# Patient Record
Sex: Male | Born: 1935
Health system: Southern US, Community
[De-identification: ages and names within clinical notes are randomized; demographics above are authoritative.]

## PROBLEM LIST (undated history)

## (undated) DIAGNOSIS — I2510 Atherosclerotic heart disease of native coronary artery without angina pectoris: Secondary | ICD-10-CM

## (undated) DIAGNOSIS — Z87898 Personal history of other specified conditions: Secondary | ICD-10-CM

## (undated) DIAGNOSIS — Z8719 Personal history of other diseases of the digestive system: Secondary | ICD-10-CM

## (undated) DIAGNOSIS — M1990 Unspecified osteoarthritis, unspecified site: Secondary | ICD-10-CM

## (undated) DIAGNOSIS — I10 Essential (primary) hypertension: Secondary | ICD-10-CM

## (undated) DIAGNOSIS — T8859XA Other complications of anesthesia, initial encounter: Secondary | ICD-10-CM

## (undated) DIAGNOSIS — E785 Hyperlipidemia, unspecified: Secondary | ICD-10-CM

## (undated) DIAGNOSIS — K635 Polyp of colon: Secondary | ICD-10-CM

## (undated) DIAGNOSIS — I219 Acute myocardial infarction, unspecified: Secondary | ICD-10-CM

## (undated) DIAGNOSIS — N138 Other obstructive and reflux uropathy: Secondary | ICD-10-CM

## (undated) DIAGNOSIS — K5730 Diverticulosis of large intestine without perforation or abscess without bleeding: Secondary | ICD-10-CM

## (undated) DIAGNOSIS — G253 Myoclonus: Secondary | ICD-10-CM

## (undated) DIAGNOSIS — N401 Enlarged prostate with lower urinary tract symptoms: Secondary | ICD-10-CM

## (undated) DIAGNOSIS — Z87442 Personal history of urinary calculi: Secondary | ICD-10-CM

## (undated) DIAGNOSIS — C439 Malignant melanoma of skin, unspecified: Secondary | ICD-10-CM

## (undated) DIAGNOSIS — T4145XA Adverse effect of unspecified anesthetic, initial encounter: Secondary | ICD-10-CM

## (undated) DIAGNOSIS — E119 Type 2 diabetes mellitus without complications: Secondary | ICD-10-CM

## (undated) DIAGNOSIS — Z87448 Personal history of other diseases of urinary system: Secondary | ICD-10-CM

## (undated) DIAGNOSIS — F419 Anxiety disorder, unspecified: Secondary | ICD-10-CM

## (undated) DIAGNOSIS — K859 Acute pancreatitis without necrosis or infection, unspecified: Secondary | ICD-10-CM

## (undated) DIAGNOSIS — K831 Obstruction of bile duct: Secondary | ICD-10-CM

## (undated) HISTORY — DX: Polyp of colon: K63.5

## (undated) HISTORY — DX: Atherosclerotic heart disease of native coronary artery without angina pectoris: I25.10

## (undated) HISTORY — DX: Personal history of other diseases of urinary system: Z87.448

## (undated) HISTORY — PX: APPENDECTOMY: SHX54

## (undated) HISTORY — DX: Personal history of other specified conditions: Z87.898

## (undated) HISTORY — DX: Anxiety disorder, unspecified: F41.9

## (undated) HISTORY — DX: Type 2 diabetes mellitus without complications: E11.9

## (undated) HISTORY — DX: Diverticulosis of large intestine without perforation or abscess without bleeding: K57.30

## (undated) HISTORY — DX: Personal history of other diseases of the digestive system: Z87.19

## (undated) HISTORY — DX: Malignant melanoma of skin, unspecified: C43.9

## (undated) HISTORY — PX: OTHER SURGICAL HISTORY: SHX169

## (undated) HISTORY — DX: Other obstructive and reflux uropathy: N13.8

## (undated) HISTORY — DX: Essential (primary) hypertension: I10

## (undated) HISTORY — DX: Hyperlipidemia, unspecified: E78.5

## (undated) HISTORY — DX: Unspecified osteoarthritis, unspecified site: M19.90

## (undated) HISTORY — PX: CHOLECYSTECTOMY: SHX55

## (undated) HISTORY — PX: CATARACT EXTRACTION: SUR2

## (undated) HISTORY — DX: Benign prostatic hyperplasia with lower urinary tract symptoms: N40.1

## (undated) HISTORY — PX: KNEE SURGERY: SHX244

## (undated) HISTORY — DX: Myoclonus: G25.3

---

## 2000-01-26 ENCOUNTER — Inpatient Hospital Stay (HOSPITAL_COMMUNITY): Admission: EM | Admit: 2000-01-26 | Discharge: 2000-01-30 | Payer: Self-pay | Admitting: Emergency Medicine

## 2000-01-26 ENCOUNTER — Encounter: Payer: Self-pay | Admitting: Emergency Medicine

## 2000-01-27 ENCOUNTER — Encounter: Payer: Self-pay | Admitting: Endocrinology

## 2000-01-28 ENCOUNTER — Encounter: Payer: Self-pay | Admitting: Pulmonary Disease

## 2000-01-28 ENCOUNTER — Encounter: Payer: Self-pay | Admitting: Orthopedic Surgery

## 2000-01-29 ENCOUNTER — Encounter: Payer: Self-pay | Admitting: Orthopedic Surgery

## 2000-09-03 ENCOUNTER — Ambulatory Visit (HOSPITAL_COMMUNITY): Admission: RE | Admit: 2000-09-03 | Discharge: 2000-09-03 | Payer: Self-pay | Admitting: Internal Medicine

## 2000-09-03 ENCOUNTER — Encounter: Payer: Self-pay | Admitting: Internal Medicine

## 2000-09-10 ENCOUNTER — Encounter: Payer: Self-pay | Admitting: Urology

## 2000-09-10 ENCOUNTER — Ambulatory Visit (HOSPITAL_COMMUNITY): Admission: RE | Admit: 2000-09-10 | Discharge: 2000-09-10 | Payer: Self-pay | Admitting: Urology

## 2000-09-30 ENCOUNTER — Other Ambulatory Visit: Admission: RE | Admit: 2000-09-30 | Discharge: 2000-09-30 | Payer: Self-pay | Admitting: Internal Medicine

## 2000-09-30 ENCOUNTER — Encounter (INDEPENDENT_AMBULATORY_CARE_PROVIDER_SITE_OTHER): Payer: Self-pay | Admitting: Specialist

## 2002-07-25 ENCOUNTER — Encounter: Payer: Self-pay | Admitting: Pulmonary Disease

## 2002-07-25 ENCOUNTER — Inpatient Hospital Stay (HOSPITAL_COMMUNITY): Admission: AD | Admit: 2002-07-25 | Discharge: 2002-07-29 | Payer: Self-pay | Admitting: Pulmonary Disease

## 2004-04-29 ENCOUNTER — Ambulatory Visit: Payer: Self-pay | Admitting: Pulmonary Disease

## 2004-05-10 ENCOUNTER — Ambulatory Visit: Payer: Self-pay | Admitting: Pulmonary Disease

## 2004-09-06 ENCOUNTER — Ambulatory Visit: Payer: Self-pay | Admitting: Pulmonary Disease

## 2005-01-13 ENCOUNTER — Ambulatory Visit: Payer: Self-pay | Admitting: Pulmonary Disease

## 2005-03-27 ENCOUNTER — Ambulatory Visit: Payer: Self-pay | Admitting: Pulmonary Disease

## 2005-04-10 HISTORY — PX: SHOULDER SURGERY: SHX246

## 2005-05-07 ENCOUNTER — Ambulatory Visit (HOSPITAL_BASED_OUTPATIENT_CLINIC_OR_DEPARTMENT_OTHER): Admission: RE | Admit: 2005-05-07 | Discharge: 2005-05-07 | Payer: Self-pay | Admitting: Orthopedic Surgery

## 2005-05-09 ENCOUNTER — Ambulatory Visit: Payer: Self-pay | Admitting: Pulmonary Disease

## 2005-05-11 ENCOUNTER — Emergency Department (HOSPITAL_COMMUNITY): Admission: EM | Admit: 2005-05-11 | Discharge: 2005-05-11 | Payer: Self-pay | Admitting: Emergency Medicine

## 2005-07-15 ENCOUNTER — Ambulatory Visit: Payer: Self-pay | Admitting: Pulmonary Disease

## 2005-08-04 ENCOUNTER — Ambulatory Visit: Payer: Self-pay | Admitting: Pulmonary Disease

## 2006-01-12 ENCOUNTER — Ambulatory Visit: Payer: Self-pay | Admitting: Pulmonary Disease

## 2006-06-02 ENCOUNTER — Ambulatory Visit: Payer: Self-pay | Admitting: Pulmonary Disease

## 2006-06-02 ENCOUNTER — Ambulatory Visit: Payer: Self-pay | Admitting: Internal Medicine

## 2006-06-02 ENCOUNTER — Inpatient Hospital Stay (HOSPITAL_COMMUNITY): Admission: EM | Admit: 2006-06-02 | Discharge: 2006-06-04 | Payer: Self-pay | Admitting: Emergency Medicine

## 2006-07-13 ENCOUNTER — Ambulatory Visit: Payer: Self-pay | Admitting: Pulmonary Disease

## 2006-12-25 ENCOUNTER — Ambulatory Visit: Payer: Self-pay | Admitting: Pulmonary Disease

## 2006-12-29 ENCOUNTER — Ambulatory Visit (HOSPITAL_BASED_OUTPATIENT_CLINIC_OR_DEPARTMENT_OTHER): Admission: RE | Admit: 2006-12-29 | Discharge: 2006-12-29 | Payer: Self-pay | Admitting: Urology

## 2006-12-31 ENCOUNTER — Ambulatory Visit (HOSPITAL_COMMUNITY): Admission: RE | Admit: 2006-12-31 | Discharge: 2006-12-31 | Payer: Self-pay | Admitting: Urology

## 2007-01-26 ENCOUNTER — Telehealth (INDEPENDENT_AMBULATORY_CARE_PROVIDER_SITE_OTHER): Payer: Self-pay | Admitting: *Deleted

## 2007-02-12 DIAGNOSIS — I10 Essential (primary) hypertension: Secondary | ICD-10-CM | POA: Insufficient documentation

## 2007-02-12 DIAGNOSIS — D126 Benign neoplasm of colon, unspecified: Secondary | ICD-10-CM

## 2007-02-12 DIAGNOSIS — M1990 Unspecified osteoarthritis, unspecified site: Secondary | ICD-10-CM

## 2007-02-12 DIAGNOSIS — N200 Calculus of kidney: Secondary | ICD-10-CM

## 2007-02-12 DIAGNOSIS — I152 Hypertension secondary to endocrine disorders: Secondary | ICD-10-CM | POA: Insufficient documentation

## 2007-02-15 ENCOUNTER — Ambulatory Visit: Payer: Self-pay | Admitting: Pulmonary Disease

## 2007-02-15 DIAGNOSIS — K5730 Diverticulosis of large intestine without perforation or abscess without bleeding: Secondary | ICD-10-CM | POA: Insufficient documentation

## 2007-02-15 DIAGNOSIS — F419 Anxiety disorder, unspecified: Secondary | ICD-10-CM | POA: Insufficient documentation

## 2007-02-15 DIAGNOSIS — F411 Generalized anxiety disorder: Secondary | ICD-10-CM

## 2007-02-15 DIAGNOSIS — N12 Tubulo-interstitial nephritis, not specified as acute or chronic: Secondary | ICD-10-CM | POA: Insufficient documentation

## 2007-02-15 DIAGNOSIS — I2510 Atherosclerotic heart disease of native coronary artery without angina pectoris: Secondary | ICD-10-CM | POA: Insufficient documentation

## 2007-02-21 DIAGNOSIS — K2990 Gastroduodenitis, unspecified, without bleeding: Secondary | ICD-10-CM

## 2007-02-21 DIAGNOSIS — K2970 Gastritis, unspecified, without bleeding: Secondary | ICD-10-CM | POA: Insufficient documentation

## 2007-02-21 LAB — CONVERTED CEMR LAB
ALT: 26 units/L (ref 0–53)
Albumin: 4.1 g/dL (ref 3.5–5.2)
BUN: 19 mg/dL (ref 6–23)
Basophils Absolute: 0.2 10*3/uL — ABNORMAL HIGH (ref 0.0–0.1)
Bilirubin, Direct: 0.2 mg/dL (ref 0.0–0.3)
Calcium: 9.6 mg/dL (ref 8.4–10.5)
Eosinophils Absolute: 0.2 10*3/uL (ref 0.0–0.6)
GFR calc Af Amer: 85 mL/min
GFR calc non Af Amer: 70 mL/min
Glucose, Bld: 143 mg/dL — ABNORMAL HIGH (ref 70–99)
HDL: 31.9 mg/dL — ABNORMAL LOW (ref >39.0)
Lymphocytes Relative: 33.5 % (ref 12.0–46.0)
MCHC: 35 g/dL (ref 30.0–36.0)
MCV: 93.6 fL (ref 78.0–100.0)
Monocytes Relative: 9.5 % (ref 3.0–11.0)
Neutro Abs: 2.7 10*3/uL (ref 1.4–7.7)
Platelets: 297 10*3/uL (ref 150–400)
TSH: 2.44 microintl units/mL (ref 0.35–5.50)
Triglycerides: 144 mg/dL (ref 0–149)

## 2007-03-16 ENCOUNTER — Ambulatory Visit: Payer: Self-pay | Admitting: Cardiology

## 2007-03-16 ENCOUNTER — Inpatient Hospital Stay (HOSPITAL_COMMUNITY): Admission: EM | Admit: 2007-03-16 | Discharge: 2007-03-19 | Payer: Self-pay | Admitting: Emergency Medicine

## 2007-03-16 ENCOUNTER — Ambulatory Visit: Payer: Self-pay | Admitting: Pulmonary Disease

## 2007-03-17 ENCOUNTER — Encounter: Payer: Self-pay | Admitting: Pulmonary Disease

## 2007-03-17 ENCOUNTER — Encounter: Payer: Self-pay | Admitting: Internal Medicine

## 2007-03-22 ENCOUNTER — Ambulatory Visit: Payer: Self-pay | Admitting: Internal Medicine

## 2007-04-26 ENCOUNTER — Ambulatory Visit: Payer: Self-pay | Admitting: Internal Medicine

## 2007-04-26 LAB — CONVERTED CEMR LAB
AST: 21 units/L (ref 0–37)
Albumin: 3.9 g/dL (ref 3.5–5.2)
Amylase: 68 units/L (ref 27–131)
Lipase: 46 units/L (ref 11.0–59.0)

## 2007-08-16 ENCOUNTER — Ambulatory Visit: Payer: Self-pay | Admitting: Pulmonary Disease

## 2007-08-17 ENCOUNTER — Encounter: Payer: Self-pay | Admitting: Pulmonary Disease

## 2007-08-22 LAB — CONVERTED CEMR LAB
ALT: 29 units/L (ref 0–53)
Albumin: 4.1 g/dL (ref 3.5–5.2)
CO2: 30 meq/L (ref 19–32)
Calcium: 9.7 mg/dL (ref 8.4–10.5)
Creatinine, Ser: 1.2 mg/dL (ref 0.4–1.5)
Glucose, Bld: 136 mg/dL — ABNORMAL HIGH (ref 70–99)
HDL: 31.1 mg/dL — ABNORMAL LOW (ref >39.0)
Hgb A1c MFr Bld: 6.9 % — ABNORMAL HIGH (ref 4.6–6.0)
Total CHOL/HDL Ratio: 5.1
Total Protein: 7.2 g/dL (ref 6.0–8.3)
Triglycerides: 162 mg/dL — ABNORMAL HIGH (ref 0–149)

## 2007-08-23 ENCOUNTER — Telehealth (INDEPENDENT_AMBULATORY_CARE_PROVIDER_SITE_OTHER): Payer: Self-pay | Admitting: *Deleted

## 2007-09-06 ENCOUNTER — Encounter: Payer: Self-pay | Admitting: Pulmonary Disease

## 2008-03-28 ENCOUNTER — Ambulatory Visit: Payer: Self-pay | Admitting: Pulmonary Disease

## 2008-03-29 LAB — CONVERTED CEMR LAB
AST: 22 units/L (ref 0–37)
Albumin: 4 g/dL (ref 3.5–5.2)
Alkaline Phosphatase: 61 units/L (ref 39–117)
BUN: 18 mg/dL (ref 6–23)
Bilirubin, Direct: 0.1 mg/dL (ref 0.0–0.3)
Chloride: 104 meq/L (ref 96–112)
Eosinophils Relative: 4.2 % (ref 0.0–5.0)
GFR calc non Af Amer: 70 mL/min
Glucose, Bld: 171 mg/dL — ABNORMAL HIGH (ref 70–99)
Monocytes Relative: 9.9 % (ref 3.0–12.0)
Neutrophils Relative %: 53.9 % (ref 43.0–77.0)
Platelets: 317 10*3/uL (ref 150–400)
Potassium: 3.6 meq/L (ref 3.5–5.1)
WBC: 6.3 10*3/uL (ref 4.5–10.5)

## 2008-04-16 ENCOUNTER — Inpatient Hospital Stay (HOSPITAL_COMMUNITY): Admission: EM | Admit: 2008-04-16 | Discharge: 2008-04-18 | Payer: Self-pay | Admitting: Emergency Medicine

## 2008-04-16 ENCOUNTER — Ambulatory Visit: Payer: Self-pay | Admitting: Critical Care Medicine

## 2008-04-16 ENCOUNTER — Ambulatory Visit: Payer: Self-pay | Admitting: Cardiology

## 2008-04-17 ENCOUNTER — Encounter: Payer: Self-pay | Admitting: Pulmonary Disease

## 2008-04-18 ENCOUNTER — Encounter: Payer: Self-pay | Admitting: Pulmonary Disease

## 2008-04-18 DIAGNOSIS — Z8669 Personal history of other diseases of the nervous system and sense organs: Secondary | ICD-10-CM

## 2008-04-19 ENCOUNTER — Telehealth (INDEPENDENT_AMBULATORY_CARE_PROVIDER_SITE_OTHER): Payer: Self-pay

## 2008-04-20 ENCOUNTER — Ambulatory Visit: Payer: Self-pay

## 2008-04-20 ENCOUNTER — Encounter: Payer: Self-pay | Admitting: Cardiology

## 2008-05-03 ENCOUNTER — Ambulatory Visit: Payer: Self-pay | Admitting: Pulmonary Disease

## 2008-06-02 ENCOUNTER — Ambulatory Visit: Payer: Self-pay | Admitting: Cardiology

## 2008-06-02 ENCOUNTER — Encounter: Payer: Self-pay | Admitting: Cardiology

## 2008-06-12 ENCOUNTER — Ambulatory Visit: Payer: Self-pay | Admitting: Cardiology

## 2008-06-13 LAB — CONVERTED CEMR LAB
BUN: 21 mg/dL (ref 6–23)
CO2: 30 meq/L (ref 19–32)
GFR calc non Af Amer: 70.04 mL/min (ref >60)
Glucose, Bld: 139 mg/dL — ABNORMAL HIGH (ref 70–99)
Potassium: 3.5 meq/L (ref 3.5–5.1)

## 2008-08-10 ENCOUNTER — Ambulatory Visit: Payer: Self-pay | Admitting: Pulmonary Disease

## 2008-08-16 LAB — CONVERTED CEMR LAB
ALT: 26 units/L (ref 0–53)
AST: 22 units/L (ref 0–37)
Albumin: 4 g/dL (ref 3.5–5.2)
Alkaline Phosphatase: 56 units/L (ref 39–117)
Basophils Relative: 0.5 % (ref 0.0–3.0)
Calcium: 9.4 mg/dL (ref 8.4–10.5)
Eosinophils Relative: 5.6 % — ABNORMAL HIGH (ref 0.0–5.0)
GFR calc non Af Amer: 70.01 mL/min (ref >60)
Glucose, Bld: 135 mg/dL — ABNORMAL HIGH (ref 70–99)
HCT: 46.4 % (ref 39.0–52.0)
Hemoglobin: 16 g/dL (ref 13.0–17.0)
LDL Cholesterol: 98 mg/dL (ref 0–99)
Lymphocytes Relative: 32.7 % (ref 12.0–46.0)
Lymphs Abs: 1.7 10*3/uL (ref 0.7–4.0)
Monocytes Relative: 10.1 % (ref 3.0–12.0)
Neutro Abs: 2.7 10*3/uL (ref 1.4–7.7)
Potassium: 4.1 meq/L (ref 3.5–5.1)
RBC: 4.86 M/uL (ref 4.22–5.81)
Sodium: 141 meq/L (ref 135–145)
TSH: 2.08 microintl units/mL (ref 0.35–5.50)
Total CHOL/HDL Ratio: 5
VLDL: 32.4 mg/dL (ref 0.0–40.0)
WBC: 5.2 10*3/uL (ref 4.5–10.5)

## 2008-09-21 ENCOUNTER — Encounter (INDEPENDENT_AMBULATORY_CARE_PROVIDER_SITE_OTHER): Payer: Self-pay | Admitting: *Deleted

## 2008-10-13 ENCOUNTER — Encounter (INDEPENDENT_AMBULATORY_CARE_PROVIDER_SITE_OTHER): Payer: Self-pay | Admitting: *Deleted

## 2009-02-12 ENCOUNTER — Ambulatory Visit: Payer: Self-pay | Admitting: Pulmonary Disease

## 2009-02-14 LAB — CONVERTED CEMR LAB
ALT: 32 units/L (ref 0–53)
AST: 24 units/L (ref 0–37)
Alkaline Phosphatase: 60 units/L (ref 39–117)
BUN: 18 mg/dL (ref 6–23)
Basophils Relative: 0.9 % (ref 0.0–3.0)
Bilirubin, Direct: 0.1 mg/dL (ref 0.0–0.3)
Chloride: 104 meq/L (ref 96–112)
Cholesterol: 174 mg/dL (ref 0–200)
Creatinine, Ser: 1.1 mg/dL (ref 0.4–1.5)
Eosinophils Relative: 4.4 % (ref 0.0–5.0)
GFR calc non Af Amer: 69.91 mL/min (ref >60)
HCT: 45.7 % (ref 39.0–52.0)
LDL Cholesterol: 104 mg/dL — ABNORMAL HIGH (ref 0–99)
MCV: 98.2 fL (ref 78.0–100.0)
Monocytes Relative: 11.2 % (ref 3.0–12.0)
Neutrophils Relative %: 55.2 % (ref 43.0–77.0)
Platelets: 297 10*3/uL (ref 150.0–400.0)
Potassium: 3.7 meq/L (ref 3.5–5.1)
RBC: 4.65 M/uL (ref 4.22–5.81)
Total Bilirubin: 0.8 mg/dL (ref 0.3–1.2)
Total CHOL/HDL Ratio: 5
Total Protein: 7.2 g/dL (ref 6.0–8.3)
VLDL: 34 mg/dL (ref 0.0–40.0)
WBC: 5.6 10*3/uL (ref 4.5–10.5)

## 2009-03-12 ENCOUNTER — Telehealth: Payer: Self-pay | Admitting: Pulmonary Disease

## 2009-03-26 ENCOUNTER — Encounter (INDEPENDENT_AMBULATORY_CARE_PROVIDER_SITE_OTHER): Payer: Self-pay | Admitting: *Deleted

## 2009-04-12 ENCOUNTER — Encounter: Payer: Self-pay | Admitting: Pulmonary Disease

## 2009-05-03 ENCOUNTER — Encounter (INDEPENDENT_AMBULATORY_CARE_PROVIDER_SITE_OTHER): Payer: Self-pay | Admitting: *Deleted

## 2009-05-07 ENCOUNTER — Encounter (INDEPENDENT_AMBULATORY_CARE_PROVIDER_SITE_OTHER): Payer: Self-pay | Admitting: *Deleted

## 2009-05-07 ENCOUNTER — Ambulatory Visit: Payer: Self-pay | Admitting: Internal Medicine

## 2009-05-07 ENCOUNTER — Telehealth: Payer: Self-pay | Admitting: Pulmonary Disease

## 2009-05-17 ENCOUNTER — Telehealth (INDEPENDENT_AMBULATORY_CARE_PROVIDER_SITE_OTHER): Payer: Self-pay | Admitting: *Deleted

## 2009-05-21 ENCOUNTER — Ambulatory Visit: Payer: Self-pay | Admitting: Internal Medicine

## 2009-08-14 ENCOUNTER — Ambulatory Visit: Payer: Self-pay | Admitting: Pulmonary Disease

## 2009-08-22 LAB — CONVERTED CEMR LAB
BUN: 19 mg/dL (ref 6–23)
CO2: 31 meq/L (ref 19–32)
Calcium: 9.6 mg/dL (ref 8.4–10.5)
Chloride: 103 meq/L (ref 96–112)
Creatinine, Ser: 1 mg/dL (ref 0.4–1.5)
Glucose, Bld: 146 mg/dL — ABNORMAL HIGH (ref 70–99)
Hgb A1c MFr Bld: 7.3 % — ABNORMAL HIGH (ref 4.6–6.5)
LDL Cholesterol: 88 mg/dL (ref 0–99)
Total CHOL/HDL Ratio: 5
Triglycerides: 192 mg/dL — ABNORMAL HIGH (ref 0.0–149.0)

## 2009-11-04 ENCOUNTER — Ambulatory Visit: Payer: Self-pay | Admitting: Cardiovascular Disease

## 2009-11-04 ENCOUNTER — Observation Stay (HOSPITAL_COMMUNITY): Admission: EM | Admit: 2009-11-04 | Discharge: 2009-11-06 | Payer: Self-pay | Admitting: Emergency Medicine

## 2009-11-09 ENCOUNTER — Encounter: Payer: Self-pay | Admitting: Pulmonary Disease

## 2009-12-26 ENCOUNTER — Encounter: Payer: Self-pay | Admitting: Pulmonary Disease

## 2010-02-27 ENCOUNTER — Telehealth (INDEPENDENT_AMBULATORY_CARE_PROVIDER_SITE_OTHER): Payer: Self-pay | Admitting: *Deleted

## 2010-03-12 NOTE — Letter (Signed)
Summary: Moviprep Instructions  [facility]  [address]   [place], [place] [address]   Phone: [phone]  Fax: [phone]       CHEVY SWEIGERT    06-25-1934    MRN: [id number]        Procedure Day /Date: Monday 05-21-09     Arrival Time: 7:30 a.m.      Procedure Time: 8:30 a.m.     Location of Procedure:                    x   [facility] (4th Floor)                        PREPARATION FOR COLONOSCOPY WITH MOVIPREP   Starting 5 days prior to your procedure 05-16-09  do not eat nuts, seeds, popcorn, corn, beans, peas,  salads, or any raw vegetables.  Do not take any fiber supplements (e.g. Metamucil, Citrucel, and Benefiber).  THE DAY BEFORE YOUR PROCEDURE         DATE: 05-20-09   DAY: Sunday  1.  Drink clear liquids the entire day-NO SOLID FOOD  2.  Do not drink anything colored red or purple.  Avoid juices with pulp.  No orange juice.  3.  Drink at least 64 oz. (8 glasses) of fluid/clear liquids during the day to prevent dehydration and help the prep work efficiently.  CLEAR LIQUIDS INCLUDE: Water Jello Ice Popsicles Tea (sugar ok, no milk/cream) Powdered fruit flavored drinks Coffee (sugar ok, no milk/cream) Gatorade Juice: apple, white grape, white cranberry  Lemonade Clear bullion, consomm, broth Carbonated beverages (any kind) Strained chicken noodle soup Hard Candy                             4.  In the morning, mix first dose of MoviPrep solution:    Empty 1 Pouch A and 1 Pouch B into the disposable container    Add lukewarm drinking water to the top line of the container. Mix to dissolve    Refrigerate (mixed solution should be used within 24 hrs)  5.  Begin drinking the prep at 5:00 p.m. The MoviPrep container is divided by 4 marks.   Every 15 minutes drink the solution down to the next mark (approximately 8 oz) until the full liter is complete.   6.  Follow completed prep with 16 oz of clear liquid of your choice  (Nothing red or purple).  Continue to drink clear liquids until bedtime.  7.  Before going to bed, mix second dose of MoviPrep solution:    Empty 1 Pouch A and 1 Pouch B into the disposable container    Add lukewarm drinking water to the top line of the container. Mix to dissolve    Refrigerate  THE DAY OF YOUR PROCEDURE      DATE: 05-21-09   DAY: Monday  Beginning at 3:30 a.m. (5 hours before procedure):         1. Every 15 minutes, drink the solution down to the next mark (approx 8 oz) until the full liter is complete.  2. Follow completed prep with 16 oz. of clear liquid of your choice.    3. You may drink clear liquids until 6:30 a.m.  (2 HOURS BEFORE PROCEDURE).   MEDICATION INSTRUCTIONS  Unless otherwise instructed, you should take regular prescription medications with a small sip of water   as  early as possible the morning of your procedure . See diabetic instructions on separate sheet  Additional medication instructions: Hold Lisinopril the morning of  procedure.         OTHER INSTRUCTIONS  You will need a responsible adult at least 74 years of age to accompany you and drive you home.   This person must remain in the waiting room during your procedure.  Wear loose fitting clothing that is easily removed.  Leave jewelry and other valuables at home.  However, you may wish to bring a book to read or  an iPod/MP3 player to listen to music as you wait for your procedure to start.  Remove all body piercing jewelry and leave at home.  Total time from sign-in until discharge is approximately 2-3 hours.  You should go home directly after your procedure and rest.  You can resume normal activities the  day after your procedure.  The day of your procedure you should not:   Drive   Make legal decisions   Operate machinery   Drink alcohol   Return to work  You will receive specific instructions about eating, activities and medications before you  leave.    The above instructions have been reviewed and explained to me by   _______________________    I fully understand and can verbalize these instructions _____________________________ Date _________

## 2010-03-12 NOTE — Assessment & Plan Note (Signed)
Summary: 6 months/apc   Primary Care Provider:  Kieran Arreguin  CC:  6 month ROV & review of mult medical problems....  History of Present Illness: 76 y/o WM here for a follow up visit... he has multiple medical problems as noted below... Followed for general medical purposes w/ HBP, CAD, Hyperlipidemia, DM, DJD, etc... also sees DrWall for Cards, DrPerry for GI (Gastritis, Divertics, Polyps), & DrRDavis for Urology (Hx Kidney stones & BPH)...    ~  [facility] 3/7-9/10 w/ dizzy & presyncope- hx CAD w/ non-obstructive dis on cath 2004... denied CP, EKG w/ NSSTTWA, enz neg, monitored on the ward w/o arrhythmia, seen by DrWall w/ 2DEcho showing similar to prev w/ mild LVD, EF= 50-55%, mild HK in distal ant wall, mild incr AoV thickness, mild MR...   ~  f/u DrWall- no CP, no palpit, no dizziness... Myoview 04/20/08 showed mild global HK, EF= 47%, apical thinning vs sm infarct & mild apical ischemia- reported as a low risk study... he incr Lisinopril/ Hct to Bid w/ K20...   ~  February 12, 2009:  the cold weather bothers his hips and knees... BP well controlled on meds; no CP, palpit, SOB, edema, etc; he has sl incr TG & low HDL w/ rec to try Fenofibrate but he refuses- choosing diet alone;  weight up 5# over the holidays w/ BS at homeOK per pt... see prob list below>>  OK Tdap today.   ~  August 14, 2009:  he's had a good 2mo- he saw DrGraves for Ortho 3/11 w/ "endstage" DJD in knees & told he needs TKR, he's lost 19# on diet & states he's doing just fine... also saw DrPerry 4/11 for colonoscopy +divertics, fair prep but no polyps seen (Dr wants f/u 60yrs w/ better prep)... BP controlled;  denies angina, etc;  Lipids doing well on diet with his weight loss;  DM control similar- keep up the good work...   Current Problem List:  HEARING LOSS - he also sees DrShoemaker w/ hearing eval 7/09 & new hearing aides...  HYPERTENSION (ICD-401.9) - controlled on METOPROLOL 50mg Bid, LISINOPRIL/HCT 20-12.5 Bid, KCl 20/d & NORVASC  10mg /d...  BP is 140/72 here today and similar at home... he knows to follow a low salt diet and needs to lose wt... tolerating meds well- denies HA, fatigue, visual changes, CP, palipit, dizziness, syncope, dyspnea, edema, etc...  CAD (ICD-414.00) - Hx non-obstructive disease on cath 6/04 w/ 50%3rdDiag branch of LAD, 40%CIRC, 30%distalRCA... he takes ASA 81mg  daily and practicing secondary risk factor reduction strategy... no CP, palpit, change in breathing, etc... he is due for a f/u w/ drWall this spring...  ~  Mar10:  Myoview showed mild global HK, EF= 47%, apic thinning vs sm ifarct & ?mild ischemia.  ~  7/11:  no CP, palpit, SOB, etc... he is active- walking, yard, etc...  HYPERLIPIDEMIA, BORDERLINE (ICD-272.4) - on diet alone...  ~  FLP 12/07 showing TChol 157, TG 137, HDL 35, LDL 95  ~  FLP 1/09 showed TChol 170, TG 144, HDL 32, LDL 109  ~  FLP 7/09 (wt= 564#) showed TChol 158, TG 162, HDL 31, LDL 95  ~  FLP in hosp 3/10 showed TChol 154, TG 177, HDL 25, LDL 94... he doesn't want Fibrate Rx.  ~  FLP 7/10 showed TChol 160, TG 162, HDL 30, LDL 98... still refuses med Rx...  ~  FLP 1/11 showed TChol 174, TG 170, HDL 36, LDL 104  ~  FLP 7/11 showed  TChol 161, TG 192, HDL 34, LDL 88  AODM (ICD-250.00) - on METFORMIN 500mg Qd + diet... discussed weight reduction strategies...  ~  in Jun 28, 2006 BS=127, & HgA1c=6.5 on Metformin 500Bid... pt decr to 1/d on his own.  ~  labs 1/09 showed BS= 143, HgA1c= 6.8.Marland Kitchen.  ~  labs 7/09 (wt= 233#) showed BS= 136, HgA1c= 6.9.Marland KitchenMarland Kitchen rec- OK on Metform500/d, needs better diet.  ~  labs 2/10 (wt= 242#) showed BS= 171, HgA1c= 6.9.Marland Kitchen. rec> get wt down.  ~  labs 7/10 (wt= 240#) showed BS= 135, A1c= 7.0.Marland KitchenMarland Kitchen may need additional med.  ~  labs 1/11 (wt=245#) showed BS= 149, A1c= 7.1  ~  labs 7/11 (wt= 226#) showed BS= 146, A1c= 7.3  Hx of GASTRITIS (ICD-535.50) - on PRILOSEC 20mg  OTC Prn...  ~  EGD 2/09 by DrDBrodie showed 2cmHH, mod esophagitis, duodenitis... Rx'd  ProtonixBid.  DIVERTICULOSIS OF COLON (ICD-562.10) COLONIC POLYPS (ICD-211.3) - last colonoscopy 9/05 by DrPerry showed divertics & diminutive polyp... f/u planned 5 years & due now- he will call to set this up...  RENAL CALCULUS (ICD-592.0) - on FLOMAX 0.4mg /d per CWCBJSEG... states he had yet another stone requiring double-j stent and lithotripsy... last seen by DrHumphries 12/08... he has a f/u appt w/ BTDVVOHY 2/11 & he does his PSA labs...  ~  labs 7/10 showed PSA= 2.09  ~  labs 2/11 by [facility]- we don't have his notes.  Hx of PYELONEPHRITIS (ICD-590.80) - Adm 4/08 w/ pyelo from obstructing R kidney stone requiring cysto, stent, laser fragmentation...  DEGENERATIVE JOINT DISEASE (ICD-715.90) - he uses OTC meds w/ relief...  ANXIETY (ICD-300.00) - his wife passed away  2005/06/27 & he has been enjoying fishing, hunting, and taking care of his 38 y/o grandson.   Preventive Screening-Counseling & Management  Alcohol-Tobacco     Smoking Status: never smoked  Allergies: 1)  Codeine Phosphate (Codeine Phosphate) 2)  Morphine  Comments:  Nurse/Medical Assistant: The patient's medications and allergies were reviewed with the patient and were updated in the Medication and Allergy Lists.  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 mother deceased age 21 from MI father deceased in his 64's from ?MI 1 brother deceased age 27 from lung cancer 1 brother alive and well age 66 1 sister alive and well age 45 1 sister alive and well age 72  Social History: Reviewed history from 08/10/2008 and no changes required. widower never smoked no etoh retired exposed to second hand smoke walks daily for exercise no caffeine use 2 children--daughter 50 and son 31 Smoking Status:  never smoked  Review of Systems      See HPI  The patient denies anorexia, fever, weight loss, weight gain, vision loss, decreased hearing, hoarseness, chest pain, syncope, dyspnea on exertion, peripheral edema, prolonged cough, headaches, hemoptysis, abdominal pain, melena, hematochezia, severe indigestion/heartburn, hematuria, incontinence, muscle weakness, suspicious skin lesions, transient blindness, difficulty walking, depression, unusual weight change, abnormal bleeding, enlarged lymph nodes, and  angioedema.    Vital Signs:  Patient profile:   76 year old male Height:      72 inches Weight:      226 pounds BMI:     30.76 O2 Sat:      93 % on Room air Temp:     97.4 degrees F oral Pulse rate:   62 / minute BP sitting:   140 / 72  (right arm) Cuff size:   regular  Vitals Entered By: Randell Loop CMA (August 14, 2009 9:47 AM)  O2 Sat at Rest %:  93 O2 Flow:  Room air CC: 6 month ROV & review of mult medical problems... Is Patient Diabetic? Yes Pain Assessment Patient in pain? no      Comments no changes in meds today   Physical Exam  Additional Exam:  WD, Overweight, 76 y/o WM in NAD... GENERAL:  Alert & oriented; pleasant & cooperative... HEENT:  [place]/AT, EOM-wnl, PERRLA, EACs-clear, TMs-wnl, NOSE-clear, THROAT-clear & wnl. NECK:  Supple w/ fairROM; no JVD; normal carotid impulses w/o bruits; no thyromegaly or nodules palpated; no lymphadenopathy. CHEST:  Clear to P & A; without  wheezes/ rales/ or rhonchi. HEART:  Regular Rhythm; without murmurs/ rubs/ or gallops. ABDOMEN:  Soft & nontender; normal bowel sounds; no organomegaly or masses detected. EXT: without deformities, mild arthritic changes; no varicose veins/ +venous insuffic/ or edema. NEURO:  CN's intact;  no focal neuro deficits... DERM:  No lesions noted; no rash etc...    MISC. Report  Procedure date:  08/14/2009  Findings:      BMP (METABOL)   Sodium                    140 mEq/L                   135-145   Potassium                 4.2 mEq/L                   3.5-5.1   Chloride                  103 mEq/L                   96-112   Carbon Dioxide            31 mEq/L                    19-32   Glucose              [H]  146 mg/dL                   16-10   BUN                       19 mg/dL                    9-60   Creatinine                1.0 mg/dL                   4.5-4.0   Calcium                   9.6 mg/dL  8.4-10.5   GFR                       74.48 mL/min                >60  Lipid Panel (LIPID)   Cholesterol               161 mg/dL                   4-540   Triglycerides        [H]  192.0 mg/dL                 9.8-119.1   HDL                  [L]  47.82 mg/dL                 >95.62   LDL Cholesterol           88 mg/dL                    1-30  Hemoglobin A1C (A1C)   Hemoglobin A1C       [H]  7.3 %                       4.6-6.5   Impression & Recommendations:  Problem # 1:  HYPERTENSION (ICD-401.9) BP controlled on meds>  continue same. His updated medication list for this problem includes:    Metoprolol Tartrate 50 Mg Tabs (Metoprolol tartrate) .Marland Kitchen... Take 1 tab by mouth two times a day...    Lisinopril-hydrochlorothiazide 20-12.5 Mg Tabs (Lisinopril-hydrochlorothiazide) .Marland Kitchen... Take 2 tablet by mouth once a day    Norvasc 10 Mg Tabs (Amlodipine besylate) .Marland Kitchen... 1 tab daily  Orders: TLB-BMP (Basic Metabolic Panel-BMET) (80048-METABOL) TLB-Lipid Panel  (80061-LIPID) TLB-A1C / Hgb A1C (Glycohemoglobin) (83036-A1C)  Problem # 2:  CAD (ICD-414.00) Stable w/o angina etc... same meds. His updated medication list for this problem includes:    Aspirin Ec 325 Mg Tbec (Aspirin) .Marland Kitchen... Take one tablet by mouth daily    Metoprolol Tartrate 50 Mg Tabs (Metoprolol tartrate) .Marland Kitchen... Take 1 tab by mouth two times a day...    Lisinopril-hydrochlorothiazide 20-12.5 Mg Tabs (Lisinopril-hydrochlorothiazide) .Marland Kitchen... Take 2 tablet by mouth once a day    Norvasc 10 Mg Tabs (Amlodipine besylate) .Marland Kitchen... 1 tab daily  Problem # 3:  HYPERLIPIDEMIA, BORDERLINE (ICD-272.4) FLP looks good on the diet + his recent weight loss...  Problem # 4:  AODM (ICD-250.00) A1c still in the low 7's and don't want to add more meds... continue Metformin + diet exercise etc... His updated medication list for this problem includes:    Aspirin Ec 325 Mg Tbec (Aspirin) .Marland Kitchen... Take one tablet by mouth daily    Lisinopril-hydrochlorothiazide 20-12.5 Mg Tabs (Lisinopril-hydrochlorothiazide) .Marland Kitchen... Take 2 tablet by mouth once a day    Metformin Hcl 500 Mg Tabs (Metformin hcl) .Marland Kitchen... Take 1 tablet by mouth two times a day  Problem # 5:  COLONIC POLYPS (ICD-211.3) S/p recent colonoscopy w/ divertics, anastomosis OK, no recurrent polyps seen...  Problem # 6:  RENAL CALCULUS (ICD-592.0) Hx kidney stones, prev pyelo, etc...   Problem # 7:  OTHER MEDICAL PROBLEMS AS NOTED>>>   Complete Medication List: 1)  Aspirin Ec 325 Mg Tbec (Aspirin) .... Take one tablet by mouth daily 2)  Metoprolol Tartrate 50 Mg Tabs (Metoprolol tartrate) .... Take 1 tab by  mouth two times a day... 3)  Lisinopril-hydrochlorothiazide 20-12.5 Mg Tabs (Lisinopril-hydrochlorothiazide) .... Take 2 tablet by mouth once a day 4)  Norvasc 10 Mg Tabs (Amlodipine besylate) .Marland Kitchen.. 1 tab daily 5)  Klor-con M20 20 Meq Cr-tabs (Potassium chloride crys cr) .... Take one tablet by mouth daily. 6)  Metformin Hcl 500 Mg Tabs (Metformin hcl)  .... Take 1 tablet by mouth two times a day 7)  Flomax 0.4 Mg Cp24 (Tamsulosin hcl) .... Take 1 tablet by mouth once a day 8)  Viagra 100 Mg Tabs (Sildenafil citrate) .... Use as directed  Patient Instructions: 1)  Today we updated your med list- see below.... 2)  Continue your current meds the same... 3)  Today we did your follow up FASTING blood work... please call the "phone tree" in a few days for your lab results.Marland KitchenMarland Kitchen 4)  Stay as active as possible... 5)  Call for any problems.Marland KitchenMarland Kitchen 6)  Please schedule a follow-up appointment in 6 months. Prescriptions: VIAGRA 100 MG TABS (SILDENAFIL CITRATE) use as directed  #10 x 4   Entered by:   Randell Loop CMA   Authorized by:   Michele Mcalpine MD   Signed by:   Randell Loop CMA on 08/14/2009   Method used:   Electronically to        [facility] (retail)       [address]       [place], [place]  [address]       Ph: [phone]       Fax: [phone]   RxID:   [id number]    Immunization History:  Influenza Immunization History:    Influenza:  historical (03/27/2009)

## 2010-03-12 NOTE — Letter (Signed)
Summary: [facility]   [facility]   Imported By: Sherian Rein 05/02/2009 11:40:22  _____________________________________________________________________  External Attachment:    Type:   Image     Comment:   External Document

## 2010-03-12 NOTE — Procedures (Signed)
Summary: Colonoscopy  [name] Note: All result statuses are Final unless otherwise noted.  Tests: (1) Colonoscopy (COL)   COL Colonoscopy           DONE     [facility]     [address]     [place], [place]  [address]           COLONOSCOPY PROCEDURE REPORT           PATIENT:  [name], [name]  MR#:  [id number]     BIRTHDATE:  November 18, 1934, 74 yrs. old  GENDER:  male     ENDOSCOPIST:  Wilhemina Bonito. Eda Keys, MD     REF. BY:  Surveillance Program Recall     PROCEDURE DATE:  05/21/2009     PROCEDURE:  Surveillance Colonoscopy     ASA CLASS:  Class II     INDICATIONS:  history of pre-cancerous (adenomatous) colon polyps     ; index 1999; f/u 2002, 2005     MEDICATIONS:   Fentanyl 75 mcg IV, Versed 8 mg IV           DESCRIPTION OF PROCEDURE:   After the risks benefits and     alternatives of the procedure were thoroughly explained, informed     consent was obtained.  Digital rectal exam was performed and     revealed no abnormalities.   The LB [id number] endoscope     was introduced through the anus and advanced to the cecum, which     was identified by both the appendix and ileocecal valve, limited     by fair prep.Time to cecum = 2:36min.    The quality of the prep     was Moviprep fair.  The instrument was then slowly withdrawn (time     = 7:14 min)as the colon was fully examined.     <<PROCEDUREIMAGES>>           FINDINGS:  Moderate diverticulosis was found in the sigmoid colon.     fair prep.  There was evidence of a prior segmental colectomy in     the sigmoid colon.   Retroflexed views in the rectum revealed     internal hemorrhoids.    The scope was then withdrawn from the     patient and the procedure completed.           COMPLICATIONS:  None     ENDOSCOPIC IMPRESSION:     1) Moderate diverticulosis in the sigmoid colon     2) Fair prep     3) Prior segmental colectomy in the sigmoid colon     4) Internal hemorrhoids            RECOMMENDATIONS:     1) Follow up colonoscopy in 3 years - 2 days of clears prior to     prep           ______________________________     Wilhemina Bonito. Eda Keys, MD           CC:  Michele Mcalpine, MD; The Patient           n.     eSIGNED:   Wilhemina Bonito. Eda Keys at 05/21/2009 09:17 AM           [place], [id number]  Note: An exclamation mark (!) indicates a result that was not dispersed into the flowsheet. Document Creation Date: 05/21/2009 9:17 AM _______________________________________________________________________  (1) Order result status: Final Collection  or observation date-time: 05/21/2009 09:06 Requested date-time:  Receipt date-time:  Reported date-time:  Referring Physician:   Ordering Physician: Fransico Setters [phone]) Specimen Source:  Source: Launa Grill Order Number: [id number] Lab site:   Appended Document: Colonoscopy    Clinical Lists Changes  Observations: Added new observation of COLONNXTDUE: 05/2012 (05/21/2009 13:57)

## 2010-03-12 NOTE — Progress Notes (Signed)
Summary: rx  Phone Note Call from Patient Call back at Home Phone [phone]   Caller: Patient Call For: Ramiah Helfrich Reason for Call: Refill Medication Summary of Call: New rx fax form for [facility].  Form at Angela's desk. Initial call taken by: Eugene Gavia,  March 12, 2009 11:21 AM  Follow-up for Phone Call        Forms given to Allena Napoleon RN  March 12, 2009 11:24 AM   [email or web address] Randell Loop CMA  March 13, 2009 11:00 AM    rx have been faxed to [facility] per pts request and pt is aware Randell Loop CMA  March 13, 2009 11:51 AM      Prescriptions: METFORMIN HCL 500 MG  TABS (METFORMIN HCL) Take 1 tablet by mouth once a day  #180 x 4   Entered by:   Randell Loop CMA   Authorized by:   Michele Mcalpine MD   Signed by:   Randell Loop CMA on 03/13/2009   Method used:   Faxed to ...       [facility]* (mail-order)             ,          Ph: [phone]       Fax: [phone]   RxID:   [id number] NORVASC 10 MG  TABS (AMLODIPINE BESYLATE) 1 tab daily  #90 x 4   Entered by:   Randell Loop CMA   Authorized by:   Michele Mcalpine MD   Signed by:   Randell Loop CMA on 03/13/2009   Method used:   Faxed to ...       [facility]* (mail-order)             ,          Ph: [phone]       Fax: [phone]   RxID:   [id number] LISINOPRIL-HYDROCHLOROTHIAZIDE 20-12.5 MG  TABS (LISINOPRIL-HYDROCHLOROTHIAZIDE) Take 2 tablet by mouth once a day  #180 x 4   Entered by:   Randell Loop CMA   Authorized by:   Michele Mcalpine MD   Signed by:   Randell Loop CMA on 03/13/2009   Method used:   Faxed to ...       [facility]* (mail-order)             ,          Ph: [phone]       Fax: [phone]   RxID:   [id number] METOPROLOL TARTRATE 50 MG TABS (METOPROLOL TARTRATE) take 1 tab by mouth two times a day...  #180 x 3   Entered by:   Randell Loop CMA   Authorized by:   Michele Mcalpine MD   Signed by:   Randell Loop CMA on 03/13/2009   Method used:   Faxed  to ...       [facility]* (mail-order)             ,          Ph: [phone]       Fax: [phone]   RxID:   [id number]

## 2010-03-12 NOTE — Progress Notes (Signed)
Summary: prescript  Phone Note Call from Patient   Caller: Patient Call For: nadel Summary of Call: need amlodipine 10mg  fax to [facility] for 90 days Initial call taken by: Rickard Patience,  May 07, 2009 9:59 AM  Follow-up for Phone Call        pt wants refill sent to [facility] instead of [facility]. Refill sent to [facility]. pt aware.Carron Curie CMA  May 07, 2009 12:00 PM     Prescriptions: NORVASC 10 MG  TABS (AMLODIPINE BESYLATE) 1 tab daily  #90 x 1   Entered by:   Carron Curie CMA   Authorized by:   Michele Mcalpine MD   Signed by:   Carron Curie CMA on 05/07/2009   Method used:   Faxed to ...       [facility]* (mail-order)             ,          Ph: [phone]       Fax: [phone]   RxID:   [id number]

## 2010-03-12 NOTE — Assessment & Plan Note (Signed)
Summary: 6 months/apc   Primary Care Provider:  Maribel Hadley  CC:  6 month ROV & review of mult medical poblems....  History of Present Illness: 76 y/o WM here for a follow up visit... he has multiple medical problems as noted below...     ~  seen Feb10 doing well and he has no specific complaints or concerns... his wife passed away 3 yrs ago & he has been enjoying fishing, hunting, and taking care of his 54 y/o grandson... his weight is back up to 242# & we discussed diet & exercise... he is followed by [facility] for Urology on Flomax & doing satis... he also sees DrShoemaker w/ hearing eval 7/09 & new hearing aides...   ~  [facility] w/ dizzy & presyncope- hx CAD w/ non-obstructive dis on cath 2004... denied CP, EKG w/ NSSTTWA, enz neg, monitored on the ward w/o arrhythmia, seen by DrWall w/ 2DEcho showing similar to prev w/ mild LVD, EF= 50-55%, mild HK in distal ant wall, mild incr AoV thickness, mild MR...   ~  f/u DrWall- no CP, no palpit, no dizziness... Myoview 04/20/08 showed mild global HK, EF= 47%, apical thinning vs sm infarct & mild apical ischemia- reported as a low risk study... he incr Lisinopril/ Hct to Bid w/ K20...   ~  February 12, 2009:  the cold weather bothers his hips and knees... BP well controlled on meds; no CP, palpit, SOB, edema, etc; he has sl incr TG & low HDL w/ rec to try Fenofibrate but he refuses- choosing diet alone;  weight up 5# over the holidays w/ BS at homeOK per pt... see prob list below>>  OK Tdap today.    Current Problem List:  HYPERTENSION (ICD-401.9) - controlled on METOPROLOL 50mg Bid, LISINOPRIL/HCT 20-12.5 Bid, KCl 20/d & NORVASC 10mg /d...  BP is 134/80 here today and similar at home... he knows to follow a low salt diet and needs to lose wt... tolerating meds well- denies HA, fatigue, visual changes, CP, palipit, dizziness, syncope, dyspnea, edema, etc...  CAD (ICD-414.00) - Hx non-obstructive disease on cath 6/04 w/ 50%3rdDiag branch of LAD, 40%CIRC,  30%distalRCA... he takes ASA 81mg  daily and practicing secondary risk factor reduction strategy... no CP, palpit, change in breathing, etc... he is due for a f/u w/ drWall this spring...  ~  Mar10:  Myoview showed mild global HK, EF= 47%, apic thinning vs sm ifarct & ?mild ischemia.  HYPERLIPIDEMIA, BORDERLINE (ICD-272.4) - on diet alone...  ~  FLP 12/07 showing TChol 157, TG 137, HDL 35, LDL 95  ~  FLP 1/09 showed TChol 170, TG 144, HDL 32, LDL 109  ~  FLP 7/09 (wt= 161#) showed TChol 158, TG 162, HDL 31, LDL 95  ~  FLP in hosp 3/10 showed TChol 154, TG 177, HDL 25, LDL 94... he doesn't want Fibrate Rx.  ~  FLP 7/10 showed TChol 160, TG 162, HDL 30, LDL 98... still refuses med Rx...  ~  FLP 1/11 showed TChol 174, TG 170, HDL 36, LDL 104  AODM (ICD-250.00) - on METFORMIN 500mg Qd + diet... discussed weight reduction strategies...  ~  in 2008 BS=127, & HgA1c=6.5 on Metformin 500Bid... pt decr to 1/d on his own.  ~  labs 1/09 showed BS= 143, HgA1c= 6.8.Marland Kitchen.  ~  labs 7/09 (wt= 233#) showed BS= 136, HgA1c= 6.9.Marland KitchenMarland Kitchen rec- OK on Metform500/d, needs better diet.  ~  labs 2/10 (wt= 242#) showed BS= 171, HgA1c= 6.9.Marland Kitchen. rec> get wt down.  ~  labs 7/10 (wt= 240#) showed BS= 135, A1c= 7.0.Marland KitchenMarland Kitchen may need additional med.  ~  labs 1/11 (wt=245#) showed BS= 149, A1c= 7.1  Hx of GASTRITIS (ICD-535.50) - on PRILOSEC 20mg  OTC Prn...  ~  EGD 2/09 by DrDBrodie showed 2cmHH, mod esophagitis, duodenitis... Rx'd ProtonixBid.  DIVERTICULOSIS OF COLON (ICD-562.10) COLONIC POLYPS (ICD-211.3) - last colonoscopy 9/05 by DrPerry showed divertics & diminutive polyp... f/u planned 5 years & due now- he will call to set this up...  RENAL CALCULUS (ICD-592.0) - on FLOMAX 0.4mg /d per VZDGLOVF... states he had yet another stone requiring double-j stent and lithotripsy... last seen by DrHumphries 12/08... he has a f/u appt w/ IEPPIRJJ 2/11 & he does his PSA labs...  ~  labs 7/10 showed PSA= 2.09  ~  labs 2/11 by [facility]-  pending...  Hx of PYELONEPHRITIS (ICD-590.80) - Adm 4/08 w/ pyelo from obstructing R kidney stone requiring cysto, stent, laser fragmentation...  DEGENERATIVE JOINT DISEASE (ICD-715.90) - he uses OTC meds w/ relief...  ANXIETY (ICD-300.00)    Allergies: 1)  Codeine Phosphate (Codeine Phosphate) 2)  Morphine  Comments:  Nurse/Medical Assistant: The patient's medications and allergies were reviewed with the patient and were updated in the Medication and Allergy Lists.  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 mother deceased age 63 from MI father deceased in his 64's from ?MI 1 brother deceased age 62 from lung cancer 1 brother alive and well age 39 1 sister alive and well age 60 1 sister alive and well age 33  Social History: Reviewed history from 08/10/2008 and no changes required. widower never smoked no etoh retired exposed to second hand smoke walks daily for exercise no caffeine use 2 children--daughter 50 and son 91  Review of Systems      See HPI       The patient complains of dyspnea on exertion.  The patient denies anorexia, fever, weight loss, weight gain, vision loss, decreased hearing, hoarseness, chest pain, syncope, peripheral edema, prolonged cough, headaches, hemoptysis, abdominal pain, melena, hematochezia, severe indigestion/heartburn,  hematuria, incontinence, muscle weakness, suspicious skin lesions, transient blindness, difficulty walking, depression, unusual weight change, abnormal bleeding, enlarged lymph nodes, and angioedema.    Vital Signs:  Patient profile:   76 year old male Height:      72 inches Weight:      245.13 pounds BMI:     33.37 O2 Sat:      94 % on Room air Temp:     97.0 degrees F oral Pulse rate:   60 / minute BP sitting:   134 / 80  (left arm) Cuff size:   regular  Vitals Entered By: Randell Loop CMA (February 12, 2009 10:17 AM)  O2 Sat at Rest %:  94 O2 Flow:  Room air CC: 6 month ROV & review of mult medical poblems... Is Patient Diabetic? Yes Pain Assessment Patient in pain? no      Comments no changes in meds   Physical Exam  Additional Exam:  WD, Overweight, 76 y/o WM in NAD.Marland KitchenMarland Kitchen  GENERAL:  Alert & oriented; pleasant & cooperative... HEENT:  [place]/AT, EOM-wnl, PERRLA, EACs-clear, TMs-wnl, NOSE-clear, THROAT-clear & wnl. NECK:  Supple w/ fairROM; no JVD; normal carotid impulses w/o bruits; no thyromegaly or nodules palpated; no lymphadenopathy. CHEST:  Clear to P & A; without wheezes/ rales/ or rhonchi. HEART:  Regular Rhythm; without murmurs/ rubs/ or gallops. ABDOMEN:  Soft & nontender; normal bowel sounds; no organomegaly or masses detected. EXT: without deformities, mild arthritic changes; no varicose veins/ +venous insuffic/ or edema. NEURO:  CN's intact;  no focal neuro deficits... DERM:  No lesions noted; no rash etc...     MISC. Report  Procedure date:  02/12/2009  Findings:        Cholesterol               174 mg/dL                   1-478   Triglycerides        [H]  170.0 mg/dL                 2.9-562.1   HDL                  [L]  30.86 mg/dL                 >57.84   VLDL Cholesterol          34.0 mg/dL                  6.9-62.9   LDL Cholesterol      [H]  528 mg/dL                   4-13     Sodium                    140 mEq/L                   135-145    Potassium                 3.7 mEq/L                   3.5-5.1   Chloride                  104 mEq/L                   96-112   Carbon Dioxide            28 mEq/L                    19-32   Glucose              [H]  149 mg/dL                   24-40   BUN                       18 mg/dL                    1-02   Creatinine                1.1 mg/dL                   7.2-5.3   Calcium  9.5 mg/dL                   1.6-10.9   GFR                       69.91 mL/min                >60     White Cell Count          5.6 K/uL                    4.5-10.5   Red Cell Count            4.65 Mil/uL                 4.22-5.81   Hemoglobin                15.5 g/dL                   60.4-54.0   Hematocrit                45.7 %                      39.0-52.0   MCV                       98.2 fl                     78.0-100.0   Platelet Count            297.0 K/uL   Neutrophil %              55.2 %                      43.0-77.0   Lymphocyte %              28.3 %                      12.0-46.0   Monocyte %                11.2 %                      3.0-12.0   Eosinophils%              4.4 %                       0.0-5.0   Basophils %               0.9 %   Comments:        Total Bilirubin           0.8 mg/dL                   9.8-1.1   Direct Bilirubin          0.1 mg/dL                   9.1-4.7   Alkaline Phosphatase      60 U/L                      39-117   AST  24 U/L                      0-37   ALT                       32 U/L                      0-53   Total Protein             7.2 g/dL                    1.6-1.0   Albumin                   4.1 g/dL                    9.6-0.4     FastTSH                   2.49 uIU/mL                 0.35-5.50     Hemoglobin A1C       [H]  7.1 %                       4.6-6.5   Impression & Recommendations:  Problem # 1:  HYPERTENSION (ICD-401.9) Controlled-  continue same meds, discussed diet + exercise. His updated  medication list for this problem includes:    Metoprolol Tartrate 50 Mg Tabs (Metoprolol tartrate) .Marland Kitchen... Take 1 tab by mouth two times a day...    Lisinopril-hydrochlorothiazide 20-12.5 Mg Tabs (Lisinopril-hydrochlorothiazide) .Marland Kitchen... Take 2 tablet by mouth once a day    Norvasc 10 Mg Tabs (Amlodipine besylate) .Marland Kitchen... 1 tab daily  Orders: Venipuncture (54098) TLB-Lipid Panel (80061-LIPID) TLB-BMP (Basic Metabolic Panel-BMET) (80048-METABOL) TLB-CBC Platelet - w/Differential (85025-CBCD) TLB-Hepatic/Liver Function Pnl (80076-HEPATIC) TLB-TSH (Thyroid Stimulating Hormone) (84443-TSH) TLB-A1C / Hgb A1C (Glycohemoglobin) (83036-A1C)  Problem # 2:  CAD (ICD-414.00) Stable-  no angina, continue med Rx & secondary risk reduction strategy. His updated medication list for this problem includes:    Aspirin Ec 325 Mg Tbec (Aspirin) .Marland Kitchen... Take one tablet by mouth daily    Metoprolol Tartrate 50 Mg Tabs (Metoprolol tartrate) .Marland Kitchen... Take 1 tab by mouth two times a day...    Lisinopril-hydrochlorothiazide 20-12.5 Mg Tabs (Lisinopril-hydrochlorothiazide) .Marland Kitchen... Take 2 tablet by mouth once a day    Norvasc 10 Mg Tabs (Amlodipine besylate) .Marland Kitchen... 1 tab daily  Problem # 3:  HYPERLIPIDEMIA, BORDERLINE (ICD-272.4) He refuses Fibrate Rx for his TG & HDL... prefers diet + exerice, but must be more successful w/ wt reduction...  Problem # 4:  AODM (ICD-250.00) A1c is up to 7.1.Marland KitchenMarland Kitchen rec incr Metformin to Bid & diet + exercise are the keys. His updated medication list for this problem includes:    Aspirin Ec 325 Mg Tbec (Aspirin) .Marland Kitchen... Take one tablet by mouth daily    Lisinopril-hydrochlorothiazide 20-12.5 Mg Tabs (Lisinopril-hydrochlorothiazide) .Marland Kitchen... Take 2 tablet by mouth once a day    Metformin Hcl 500 Mg Tabs (Metformin hcl) .Marland Kitchen... Take 1 tablet by mouth once a day  Problem # 5:  Hx of GASTRITIS (ICD-535.50) GI is stable...  Problem # 6:  RENAL CALCULUS (ICD-592.0) GU per [id number] and f/u due  soon...  Problem # 7:  DEGENERATIVE JOINT DISEASE (ICD-715.90) Stableon OTC meds Prn... His updated medication list for this  problem includes:    Aspirin Ec 325 Mg Tbec (Aspirin) .Marland Kitchen... Take one tablet by mouth daily  Problem # 8:  OTHER MEDICAL PROBLEMS AS NOTED>>> OK Tdap today...  Complete Medication List: 1)  Aspirin Ec 325 Mg Tbec (Aspirin) .... Take one tablet by mouth daily 2)  Metoprolol Tartrate 50 Mg Tabs (Metoprolol tartrate) .... Take 1 tab by mouth two times a day... 3)  Lisinopril-hydrochlorothiazide 20-12.5 Mg Tabs (Lisinopril-hydrochlorothiazide) .... Take 2 tablet by mouth once a day 4)  Norvasc 10 Mg Tabs (Amlodipine besylate) .Marland Kitchen.. 1 tab daily 5)  Klor-con M20 20 Meq Cr-tabs (Potassium chloride crys cr) .... Take one tablet by mouth daily. 6)  Metformin Hcl 500 Mg Tabs (Metformin hcl) .... Take 1 tablet by mouth once a day 7)  Flomax 0.4 Mg Cp24 (Tamsulosin hcl) .... Take 1 tablet by mouth once a day  Other Orders: Tdap => 80yrs IM (19147) Admin 1st Vaccine (82956)  Patient Instructions: 1)  Today we updated your med list- see below.... 2)  We can refill your meds electronically- have [facility] contact us when refills are due.Marland KitchenMarland Kitchen 3)  Today we did your follow up FASTING blood work... please call the "phone tree" in a few days for your lab results.Marland KitchenMarland Kitchen 4)  We also gave you the 10 yr Tetanus shot called the Tdap today... 5)  Call for any problems.Marland Kitchen 6)  Stay as active as possible... 7)  Please schedule a follow-up appointment in 6 months.   Immunizations Administered:  Tetanus Vaccine:    Vaccine Type: Tdap    Site: left deltoid    Mfr: GlaxoSmithKline    Dose: 0.5 ml    Route: IM    Given by: Randell Loop CMA    Exp. Date: 08/11/2010    Lot #: [id number]    VIS given: 12/29/06 version given February 12, 2009.

## 2010-03-12 NOTE — Letter (Signed)
Summary: [facility]  [facility]   Imported By: Lester Hopland 11/20/2009 07:47:18  _____________________________________________________________________  External Attachment:    Type:   Image     Comment:   External Document

## 2010-03-12 NOTE — Letter (Signed)
Summary: Diabetic Instructions  [facility]  [address] [place], [place] [address]   Phone: [phone]  Fax: [phone]    [name] 09/20/1934 MRN: [id number]   x _   ORAL DIABETIC MEDICATION INSTRUCTIONS  The day before your procedure:   Take your diabetic pill as you do normally  The day of your procedure:   Do not take your diabetic pill    We will check your blood sugar levels during the admission process and again in Recovery before discharging you home  ________________________________________________________________________

## 2010-03-12 NOTE — Progress Notes (Signed)
Summary: refill meds  Phone Note Call from Patient Call back at Home Phone [phone]   Caller: Patient Call For: nadel Summary of Call: pt needs new rx for flomax. also needs refill for amlodipine. while this has been faxed already to [facility] (see emr note 3/28) pt states that he received a letter stating that this wouldn't be refilled until 06/07/09.  Initial call taken by: Tivis Ringer, CNA,  May 17, 2009 9:06 AM  Follow-up for Phone Call        lmomtcb Randell Loop [facility]  May 17, 2009 10:46 AM    calld and spoke with pharmacy at [facility] for pt---they will disregard the rx to be mailed the end of april.  he is aware that the amlodipine will be shipped to him tomorrow---and that the flomax has been faxed to [facility] today. Randell Loop CMA  May 17, 2009 11:56 AM     Prescriptions: FLOMAX 0.4 MG  CP24 (TAMSULOSIN HCL) Take 1 tablet by mouth once a day  #90 x 4   Entered by:   Randell Loop CMA   Authorized by:   Michele Mcalpine MD   Signed by:   Randell Loop CMA on 05/17/2009   Method used:   Print then Give to Patient   RxID:   [id number]  rx will be faxed to [facility] per pts request

## 2010-03-12 NOTE — Miscellaneous (Signed)
Summary: [facility] Previsit/prep  Clinical Lists Changes  Medications: Added new medication of MOVIPREP 100 GM  SOLR (PEG-KCL-NACL-NASULF-NA ASC-C) As per prep instructions. - Signed Rx of MOVIPREP 100 GM  SOLR (PEG-KCL-NACL-NASULF-NA ASC-C) As per prep instructions.;  #1 x 0;  Signed;  Entered by: Wyona Almas RN;  Authorized by: Hilarie Fredrickson MD;  Method used: Electronically to [facility] [address], [place], [place], [place]  [address], Ph: [phone], Fax: [phone] Observations: Added new observation of ALLERGY REV: Done (05/07/2009 9:47)    Prescriptions: MOVIPREP 100 GM  SOLR (PEG-KCL-NACL-NASULF-NA ASC-C) As per prep instructions.  #1 x 0   Entered by:   Wyona Almas RN   Authorized by:   Hilarie Fredrickson MD   Signed by:   Wyona Almas RN on 05/07/2009   Method used:   Electronically to        [facility] (retail)       [address]       [place], [place]  [address]       Ph: [phone]       Fax: [phone]   RxID:   [id number]

## 2010-03-12 NOTE — Letter (Signed)
Summary: Previsit letter  [facility]  [address] [place], [place] [address]   Phone: [phone]  Fax: [phone]       03/26/2009 MRN: [id number]  [name] [address] [place], [place]  [address]  Dear [name],  Welcome to the Gastroenterology Division at [facility]    You are scheduled to see a nurse for your pre-procedure visit on 05-07-09 at 10AM on the 3rd floor at [facility], [address]  We ask that you try to arrive at our office 15 minutes prior to your appointment time to allow for check-in.  Your nurse visit will consist of discussing your medical and surgical history, your immediate family medical history, and your medications.    Please bring a complete list of all your medications or, if you prefer, bring the medication bottles and we will list them.  We will need to be aware of both prescribed and over the counter drugs.  We will need to know exact dosage information as well.  If you are on blood thinners (Coumadin, Plavix, Aggrenox, Ticlid, etc.) please call our office today/prior to your appointment, as we need to consult with your physician about holding your medication.   Please be prepared to read and sign documents such as consent forms, a financial agreement, and acknowledgement forms.  If necessary, and with your consent, a friend or relative is welcome to sit-in on the nurse visit with you.  Please bring your insurance card so that we may make a copy of it.  If your insurance requires a referral to see a specialist, please bring your referral form from your primary care physician.  No co-pay is required for this nurse visit.     If you cannot keep your appointment, please call [phone] to cancel or reschedule prior to your appointment date.  This allows [place] the opportunity to schedule an appointment for another patient in need of care.    Thank you for choosing [facility] for your medical needs.   We appreciate the opportunity to care for you.  Please visit [place] at our website  to learn more about our practice.                     Sincerely.                                                                                                                   The Gastroenterology Division

## 2010-03-14 NOTE — Progress Notes (Signed)
Summary: refills on glucose test strips  Phone Note Call from Patient Call back at Home Phone [phone]   Caller: Patient Call For: DR NADEL Summary of Call: Patient phoned he needs a prescription for his test strips for his blood sugar. He uses [facility] [address]. Patient can be reached at [phone] Initial call taken by: Vedia Coffer,  February 27, 2010 9:56 AM  Follow-up for Phone Call        lmomtcb x 1. Not documented in medications that pt is checking blood sugars. Need to know how often pt is checking and what brand in the past has he been using.  Zackery Barefoot CMA  February 27, 2010 11:10 AM   pt returned call, states is testing two times a day.  not taking any insulins.  states uses one touch ultra blue strips.  this has been added to pt's med list and refills sent to [facility].  pt to keep his 2.3.12 appt w/ SN. Follow-up by: Boone Master CNA/MA,  February 27, 2010 2:25 PM    New/Updated Medications: ONETOUCH ULTRA BLUE  STRP (GLUCOSE BLOOD) use as directed to check blood glucose daily Prescriptions: ONETOUCH ULTRA BLUE  STRP (GLUCOSE BLOOD) use as directed to check blood glucose daily  #100 x 6   Entered by:   Boone Master CNA/MA   Authorized by:   Michele Mcalpine MD   Signed by:   Boone Master CNA/MA on 02/27/2010   Method used:   Electronically to        [facility] (retail)       [address]       [place], [place]  [address]       Ph: [phone]       Fax: [phone]   RxID:   [id number]

## 2010-03-15 ENCOUNTER — Other Ambulatory Visit: Payer: Self-pay | Admitting: Pulmonary Disease

## 2010-03-15 ENCOUNTER — Ambulatory Visit (INDEPENDENT_AMBULATORY_CARE_PROVIDER_SITE_OTHER): Payer: Medicare Other | Admitting: Pulmonary Disease

## 2010-03-15 ENCOUNTER — Encounter: Payer: Self-pay | Admitting: Pulmonary Disease

## 2010-03-15 ENCOUNTER — Ambulatory Visit: Admit: 2010-03-15 | Payer: Self-pay | Admitting: Pulmonary Disease

## 2010-03-15 ENCOUNTER — Other Ambulatory Visit: Payer: Medicare Other

## 2010-03-15 DIAGNOSIS — E785 Hyperlipidemia, unspecified: Secondary | ICD-10-CM

## 2010-03-15 DIAGNOSIS — I2510 Atherosclerotic heart disease of native coronary artery without angina pectoris: Secondary | ICD-10-CM

## 2010-03-15 DIAGNOSIS — I10 Essential (primary) hypertension: Secondary | ICD-10-CM

## 2010-03-15 DIAGNOSIS — E119 Type 2 diabetes mellitus without complications: Secondary | ICD-10-CM

## 2010-03-15 DIAGNOSIS — Z125 Encounter for screening for malignant neoplasm of prostate: Secondary | ICD-10-CM

## 2010-03-15 DIAGNOSIS — N401 Enlarged prostate with lower urinary tract symptoms: Secondary | ICD-10-CM | POA: Insufficient documentation

## 2010-03-15 DIAGNOSIS — N138 Other obstructive and reflux uropathy: Secondary | ICD-10-CM | POA: Insufficient documentation

## 2010-03-15 LAB — LIPID PANEL
Cholesterol: 166 mg/dL (ref 0–200)
Total CHOL/HDL Ratio: 5

## 2010-03-15 LAB — PSA: PSA: 3.95 ng/mL (ref 0.10–4.00)

## 2010-03-15 LAB — CBC WITH DIFFERENTIAL/PLATELET
Basophils Relative: 0.6 % (ref 0.0–3.0)
Eosinophils Absolute: 0.3 10*3/uL (ref 0.0–0.7)
Eosinophils Relative: 4.8 % (ref 0.0–5.0)
HCT: 46.7 % (ref 39.0–52.0)
Lymphs Abs: 1.9 10*3/uL (ref 0.7–4.0)
MCHC: 34.5 g/dL (ref 30.0–36.0)
MCV: 96.9 fl (ref 78.0–100.0)
Monocytes Absolute: 0.7 10*3/uL (ref 0.1–1.0)
Platelets: 301 10*3/uL (ref 150.0–400.0)
WBC: 6.2 10*3/uL (ref 4.5–10.5)

## 2010-03-15 LAB — BASIC METABOLIC PANEL
BUN: 17 mg/dL (ref 6–23)
Chloride: 102 mEq/L (ref 96–112)
GFR: 62.44 mL/min (ref >60.00)
Potassium: 3.6 mEq/L (ref 3.5–5.1)
Sodium: 140 mEq/L (ref 135–145)

## 2010-03-15 LAB — HEPATIC FUNCTION PANEL
AST: 23 U/L (ref 0–37)
Bilirubin, Direct: 0.1 mg/dL (ref 0.0–0.3)
Total Bilirubin: 0.6 mg/dL (ref 0.3–1.2)

## 2010-03-15 LAB — LDL CHOLESTEROL, DIRECT: Direct LDL: 103.2 mg/dL

## 2010-03-15 LAB — TSH: TSH: 2.64 u[IU]/mL (ref 0.35–5.50)

## 2010-03-15 NOTE — Letter (Signed)
Summary: [facility]   Imported By: Sherian Rein 01/01/2010 09:57:05  _____________________________________________________________________  External Attachment:    Type:   Image     Comment:   External Document

## 2010-03-28 NOTE — Assessment & Plan Note (Signed)
Summary: ROV   Vital Signs:  Patient profile:   76 year old male Height:      72 inches Weight:      236.25 pounds O2 Sat:      96 % on Room air Temp:     97.5 degrees F oral Pulse rate:   57 / minute BP sitting:   150 / 70  (left arm) Cuff size:   regular  Vitals Entered By: Abigail Miyamoto RN (March 15, 2010 11:54 AM)  O2 Flow:  Room air  Primary Care Provider:  Leon Montoya  CC:  7 month ROV & review of mult medical problems....  History of Present Illness: 76 y/o WM here for a follow up visit... he has multiple medical problems as noted below... Followed for general medical purposes w/ HBP, CAD, Hyperlipidemia, DM, DJD, etc... also sees DrWall for Cards, DrPerry for GI (Gastritis, Divertics, Polyps), & DrBorden for Urology (Hx Kidney stones & BPH)...    ~  August 14, 2009:  he's had a good 2mo- he saw DrGraves for Ortho 3/11 w/ "endstage" DJD in knees & told he needs TKR, he's lost 19# on diet & states he's doing just fine... also saw DrPerry 4/11 for colonoscopy +divertics, fair prep but no polyps seen (Dr wants f/u 2yrs w/ better prep)... BP controlled;  denies angina, etc;  Lipids doing well on diet with his weight loss;  DM control similar- keep up the good work...   ~  March 15, 2010:      He was hosp 9/[facility] w/ atyp[facility] & seen by Walker Kehr- no further testing felt nec;  he gave them a very old med list he had in his wallet- so Metoprolol was not given or included in his disch med rec;  BP has been running high 150-160+ & this is the 1st medical f/u since then> rec to re-start the METOPROLOL 50mg Bid.    He saw Urology, DrBorden 11/11 for f/u BPH, hx kid stones, ED & prostate cancer screening> he passed an 8mm distal right ureteral stone & has bilat nonobstructing stones in kidneys, stable on Flomax & prostaglandin injections Prn for ED.    Currently feeling well- only c/o knee arthritis as before L worse than R & holding off on TKRs "I want to keep them a while longer";  states  chest OK w/o CP, palp, SOB, edema;  controls Lipids w/ diet but weight up 10# & not checking BS at home;  wants 90d refills of all meds.   Current Problem List:  HEARING LOSS - he also sees DrShoemaker w/ hearing eval 7/09 & new hearing aides...  HYPERTENSION (ICD-401.9) - controlled on METOPROLOL 50mg Bid, LISINOPRIL/HCT 20-12.5 Bid, KCl 20/d & NORVASC 10mg /d...  BP is 150/80 here today but he is not on the BBlocker now & has an old med list in his pocket... he knows to follow a low salt diet and needs to lose wt... he is asymtomatic- denies HA, fatigue, visual changes, CP, palipit, dizziness, syncope, dyspnea, edema, etc...  ~  3/10:  Metoprolol 50mg  Bid added to his regimen.  ~  2/12:  he was using an OLD MED LIST (which we discarded) & we reviewed his 4 med regimen & asked him to monitor BP at home.  CAD (ICD-414.00) - Hx non-obstructive disease on cath 6/04 w/ 50%3rdDiag branch of LAD, 40%CIRC, 30%distalRCA... he takes ASA 81mg  daily and practicing secondary risk factor reduction strategy...  currently w/o CP, palpit, change in  breathing, etc... last seen by DrWall 4/10> stable, no changes made.  ~  3/10:  Myoview showed mild global HK, EF= 47%, apic thinning vs sm infarct & ?mild ischemia.  ~  7/11:  no CP, palpit, SOB, etc... he is active- walking, yard, etc...  ~  9/11:  adm by [facility] w/ atypCP, seen by Walker Kehr & no further testing felt necessary.  HYPERLIPIDEMIA, BORDERLINE (ICD-272.4) - on diet alone...  ~  FLP 12/07 showing TChol 157, TG 137, HDL 35, LDL 95  ~  FLP 1/09 showed TChol 170, TG 144, HDL 32, LDL 109  ~  FLP 7/09 (wt= 782#) showed TChol 158, TG 162, HDL 31, LDL 95  ~  FLP in hosp 3/10 showed TChol 154, TG 177, HDL 25, LDL 94... he doesn't want Fibrate Rx.  ~  FLP 7/10 showed TChol 160, TG 162, HDL 30, LDL 98... still refuses med Rx...  ~  FLP 1/11 showed TChol 174, TG 170, HDL 36, LDL 104  ~  FLP 7/11 showed TChol 161, TG 192, HDL 34, LDL 88  ~  FLP 2/12 showed TChol 166, TG  273, HDL 31, LDL 103... worse TG- may need Fibrate if wt not coming down!  AODM (ICD-250.00) - on METFORMIN 500mg Qd + diet... discussed weight reduction strategies...  ~  in 13-Jul-2006 BS=127, & HgA1c=6.5 on Metformin 500Bid... pt decr to 1/d on his own.  ~  labs 1/09 showed BS= 143, HgA1c= 6.8.Marland Kitchen.  ~  labs 7/09 (wt= 233#) showed BS= 136, HgA1c= 6.9.Marland KitchenMarland Kitchen rec- OK on Metform500/d, needs better diet.  ~  labs 2/10 (wt= 242#) showed BS= 171, HgA1c= 6.9.Marland Kitchen. rec> get wt down.  ~  labs 7/10 (wt= 240#) showed BS= 135, A1c= 7.0.Marland KitchenMarland Kitchen may need additional med.  ~  labs 1/11 (wt=245#) showed BS= 149, A1c= 7.1  ~  labs 7/11 (wt= 226#) showed BS= 146, A1c= 7.3  ~  labs 2/12 (wt=236#) showed BS= 151, A1c= 7.7.Marland Kitchen. rec> incr METFORM500Bid & get wt down.  Hx of GASTRITIS (ICD-535.50) - on PRILOSEC 20mg  OTC Prn...  ~  EGD 2/09 by DrDBrodie showed 2cmHH, mod esophagitis, duodenitis... Rx'd ProtonixBid.  DIVERTICULOSIS OF COLON (ICD-562.10) COLONIC POLYPS (ICD-211.3) - last colonoscopy 9/05 by DrPerry showed divertics & diminutive polyp... f/u planned 5 years & due now- he will call to set this up...  HYPERTROPHY PROSTATE W/UR OBST & OTH LUTS (ICD-600.01) RENAL CALCULUS (ICD-592.0) - on FLOMAX 0.4mg /d per Urology & followed for BPH, Urolithiasis, ED & PSA screening purposes.  ~  he has prev required cystoscopy w/ stents, & prev had lithotripsy as well...   ~  labs 7/10 showed PSA= 2.09, & subseq labs by DrRDavis (we don't have his notes).  ~  11/11:  seen by DrBorden & passed an 8mm right ureteral stone, XRays show bilat nonobstructing stones in kidneys.  Hx of PYELONEPHRITIS (ICD-590.80) - Adm 4/08 w/ pyelo from obstructing R kidney stone requiring cysto, stent, laser fragmentation...  DEGENERATIVE JOINT DISEASE (ICD-715.90) - he uses OTC meds w/ relief... followed by Sherin Quarry w/ "endstage" DJD in knees (left worse than right) & told he needs TKRs- he is determined to "keep them a little bit longer"...  ANXIETY  (ICD-300.00) - his wife passed away  07-12-05 & he has been enjoying fishing, hunting, and taking care of his 89 y/o grandson.   Allergies (verified): 1)  Codeine Phosphate (Codeine Phosphate) 2)  Morphine  Comments:  Nurse/Medical Assistant: The patient's medications and allergies were reviewed with the patient and  were updated in the Medication and Allergy Lists.  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 mother deceased age 85 from MI father deceased in his 25's from ?MI 1 brother deceased age 23 from lung cancer 1 brother alive and well age 92 1 sister alive and well age 62 1 sister alive and well age 66  Social History: Reviewed history from 08/10/2008 and no changes required. widower never smoked no etoh retired exposed to second hand smoke walks daily for exercise no caffeine use 2 children--daughter 27 and son 72  Review of Systems      See HPI       The patient complains of decreased hearing, dyspnea on exertion, and difficulty walking.  The patient denies anorexia, fever, weight loss, weight gain, vision loss, hoarseness, chest pain, syncope, peripheral edema, prolonged cough, headaches, hemoptysis, abdominal pain,  melena, hematochezia, severe indigestion/heartburn, hematuria, incontinence, muscle weakness, suspicious skin lesions, transient blindness, depression, unusual weight change, abnormal bleeding, enlarged lymph nodes, and angioedema.    Physical Exam  Additional Exam:  WD, Overweight, 76 y/o WM in NAD... GENERAL:  Alert & oriented; pleasant & cooperative... HEENT:  [place]/AT, EOM-wnl, PERRLA, EACs-clear, TMs-wnl, NOSE-clear, THROAT-clear & wnl. NECK:  Supple w/ fairROM; no JVD; normal carotid impulses w/o bruits; no thyromegaly or nodules palpated; no lymphadenopathy. CHEST:  Clear to P & A; without wheezes/ rales/ or rhonchi. HEART:  Regular Rhythm; without murmurs/ rubs/ or gallops. ABDOMEN:  Soft & nontender; normal bowel sounds; no organomegaly or masses detected. EXT:  mod arthritic changes; no varicose veins/ +venous insuffic/ or edema. NEURO:  CN's intact;  no focal neuro deficits... DERM:  No lesions noted; no rash etc...    Impression & Recommendations:  Problem # 1:  HYPERTENSION (ICD-401.9) BP is sl elevated & likely due to the old med list in his wallet & he was not on his BBlocker... reminded to throw away the old med list & go by the new list given at each office visit... meds up-dated and Metoprolol re-written at 50mg  Bid... His updated medication list for this problem includes:    Metoprolol Tartrate 50 Mg Tabs (Metoprolol tartrate) .Marland Kitchen... Take 1 tab by mouth two times a day...    Lisinopril-hydrochlorothiazide 20-12.5 Mg Tabs (Lisinopril-hydrochlorothiazide) .Marland Kitchen... Take 2 tablet by mouth once a day in the am    Norvasc 10 Mg Tabs (Amlodipine besylate) .Marland Kitchen... 1 tab daily    KCl one tab daily  Orders: TLB-BMP (Basic Metabolic Panel-BMET) (80048-METABOL) TLB-Hepatic/Liver Function Pnl (80076-HEPATIC) TLB-CBC Platelet - w/Differential (85025-CBCD) TLB-Lipid Panel (80061-LIPID) TLB-TSH (Thyroid Stimulating Hormone) (84443-TSH) TLB-A1C / Hgb A1C (Glycohemoglobin)  (83036-A1C) TLB-PSA (Prostate Specific Antigen) (84153-PSA)  Problem # 2:  CAD (ICD-414.00) He was adm 9/11>  H&P, Consult note, DCSummary, XRays & Labs all reviewed in detail... his BBlocker was omitted & we discussed this & re-started it today... he will f/u w/ DrWall at his convenience. His updated  medication list for this problem includes:    Aspirin Ec 325 Mg Tbec (Aspirin) .Marland Kitchen... Take one tablet by mouth daily    Metoprolol Tartrate 50 Mg Tabs (Metoprolol tartrate) .Marland Kitchen... Take 1 tab by mouth two times a day...    Lisinopril-hydrochlorothiazide 20-12.5 Mg Tabs (Lisinopril-hydrochlorothiazide) .Marland Kitchen... Take 2 tablet by mouth once a day in the am    Norvasc 10 Mg Tabs (Amlodipine besylate) .Marland Kitchen... 1 tab daily  Problem # 3:  HYPERLIPIDEMIA, BORDERLINE (ICD-272.4) He has refused Fibrates or other meds for his hyperlipidemia... we reviewed needed low chol/ LOW FAT diet today...  Problem # 4:  AODM (ICD-250.00) Stable on Metform500Bid>  BS in hosp 9/11 was 150-180 range, A1c was not checked... LABS TODAY >> BS 151, A1c= 7.7... must get wt down or he'll need more meds (he doesn't want 2nd med. His updated medication list for this problem includes:    Aspirin Ec 325 Mg Tbec (Aspirin) .Marland Kitchen... Take one tablet by mouth daily    Lisinopril-hydrochlorothiazide 20-12.5 Mg Tabs (Lisinopril-hydrochlorothiazide) .Marland Kitchen... Take 2 tablet by mouth once a day in the am    Metformin Hcl 500 Mg Tabs (Metformin hcl) .Marland Kitchen... Take 1 tablet by mouth two times a day  Problem # 5:  GI >>> Followed by DrPerry>  pt is overdue for colonoscopy & we will refer.  Problem # 6:  GU >>> Followed by DrBorden now & his note from 11/11 is reviewed>  continue FLomax, incr fluids for stone prevention (pt apparently never did the requested 24H urine collection).  Problem # 7:  DEGENERATIVE JOINT DISEASE (ICD-715.90) Severe DJD in knees as noted... he is not anxoius for surg... sister wants him to get a second opinion from DrAlusio & he will  decide. His updated medication list for this problem includes:    Aspirin Ec 325 Mg Tbec (Aspirin) .Marland Kitchen... Take one tablet by mouth daily  Problem # 8:  OTHER MEDICAL PROBLEMS AS NOTED>>>  Complete Medication List: 1)  Aspirin Ec 325 Mg Tbec (Aspirin) .... Take one tablet by mouth daily 2)  Metoprolol Tartrate 50 Mg Tabs (Metoprolol tartrate) .... Take 1 tab by mouth two times a day... 3)  Lisinopril-hydrochlorothiazide 20-12.5 Mg Tabs (Lisinopril-hydrochlorothiazide) .... Take 2 tablet by mouth once a day in the am 4)  Norvasc 10 Mg Tabs (Amlodipine besylate) .Marland Kitchen.. 1 tab daily 5)  Klor-con M20 20 Meq Cr-tabs (Potassium chloride crys cr) .... Take one tablet by mouth daily. 6)  Metformin Hcl 500 Mg Tabs (Metformin hcl) .... Take 1 tablet by mouth two times a day 7)  Flomax 0.4 Mg Cp24 (Tamsulosin hcl) .... Take 1 tablet by mouth once a day 8)  Onetouch Ultra Blue Strp (Glucose blood) .... Use as directed to check blood glucose daily  Patient Instructions: 1)  Today we updated your med list- see below.... 2)  We refilled your meds for 2012... 3)  Today we did your follow up blood work... please call the "phone tree" in a few days for your lab results.Marland KitchenMarland Kitchen 4)  Stay as active as possible... 5)  Call for any problems.Marland KitchenMarland Kitchen 6)  Please schedule a follow-up appointment in 6 months, sooner as needed. Prescriptions: FLOMAX 0.4 MG  CP24 (TAMSULOSIN HCL) Take 1 tablet by mouth once a day  #90 x 4   Entered and Authorized by:   Michele Mcalpine MD   Signed by:   Michele Mcalpine MD on 03/15/2010   Method used:   Print then Give to Patient  RxID:   [id number] METFORMIN HCL 500 MG  TABS (METFORMIN HCL) Take 1 tablet by mouth two times a day  #180 x 4   Entered and Authorized by:   Michele Mcalpine MD   Signed by:   Michele Mcalpine MD on 03/15/2010   Method used:   Print then Give to Patient   RxID:   [id number] KLOR-CON M20 20 MEQ CR-TABS (POTASSIUM CHLORIDE CRYS CR) Take one tablet by mouth daily.   #90 x 4   Entered and Authorized by:   Michele Mcalpine MD   Signed by:   Michele Mcalpine MD on 03/15/2010   Method used:   Print then Give to Patient   RxID:   [id number] NORVASC 10 MG  TABS (AMLODIPINE BESYLATE) 1 tab daily  #90 x 4   Entered and Authorized by:   Michele Mcalpine MD   Signed by:   Michele Mcalpine MD on 03/15/2010   Method used:   Print then Give to Patient   RxID:   [id number] LISINOPRIL-HYDROCHLOROTHIAZIDE 20-12.5 MG  TABS (LISINOPRIL-HYDROCHLOROTHIAZIDE) Take 2 tablet by mouth once a day in the AM  #180 x 4   Entered and Authorized by:   Michele Mcalpine MD   Signed by:   Michele Mcalpine MD on 03/15/2010   Method used:   Print then Give to Patient   RxID:   [id number] METOPROLOL TARTRATE 50 MG TABS (METOPROLOL TARTRATE) take 1 tab by mouth two times a day...  #180 x 4   Entered and Authorized by:   Michele Mcalpine MD   Signed by:   Michele Mcalpine MD on 03/15/2010   Method used:   Print then Give to Patient   RxID:   [id number]    Orders Added: 1)  Est. Patient Level IV [83151] 2)  TLB-BMP (Basic Metabolic Panel-BMET) [80048-METABOL] 3)  TLB-Hepatic/Liver Function Pnl [80076-HEPATIC] 4)  TLB-CBC Platelet - w/Differential [85025-CBCD] 5)  TLB-Lipid Panel [80061-LIPID] 6)  TLB-TSH (Thyroid Stimulating Hormone) [84443-TSH] 7)  TLB-A1C / Hgb A1C (Glycohemoglobin) [83036-A1C] 8)  TLB-PSA (Prostate Specific Antigen) [76160-VPX]

## 2010-04-25 LAB — DIFFERENTIAL
Eosinophils Absolute: 0.3 10*3/uL (ref 0.0–0.7)
Eosinophils Relative: 6 % — ABNORMAL HIGH (ref 0–5)
Lymphs Abs: 1.7 10*3/uL (ref 0.7–4.0)
Monocytes Relative: 13 % — ABNORMAL HIGH (ref 3–12)
Neutrophils Relative %: 48 % (ref 43–77)

## 2010-04-25 LAB — CARDIAC PANEL(CRET KIN+CKTOT+MB+TROPI)
CK, MB: 3.2 ng/mL (ref 0.3–4.0)
CK, MB: 3.8 ng/mL (ref 0.3–4.0)
Relative Index: 0.8 (ref 0.0–2.5)
Relative Index: 0.9 (ref 0.0–2.5)
Total CK: 381 U/L — ABNORMAL HIGH (ref 7–232)
Total CK: 410 U/L — ABNORMAL HIGH (ref 7–232)

## 2010-04-25 LAB — COMPREHENSIVE METABOLIC PANEL
ALT: 33 U/L (ref 0–53)
AST: 31 U/L (ref 0–37)
Albumin: 3.9 g/dL (ref 3.5–5.2)
Calcium: 9.5 mg/dL (ref 8.4–10.5)
GFR calc Af Amer: >60 mL/min (ref >60)
Sodium: 137 mEq/L (ref 135–145)
Total Protein: 6.5 g/dL (ref 6.0–8.3)

## 2010-04-25 LAB — BASIC METABOLIC PANEL
BUN: 15 mg/dL (ref 6–23)
Chloride: 102 mEq/L (ref 96–112)
Creatinine, Ser: 1.06 mg/dL (ref 0.4–1.5)
Glucose, Bld: 161 mg/dL — ABNORMAL HIGH (ref 70–99)
Potassium: 3.5 mEq/L (ref 3.5–5.1)

## 2010-04-25 LAB — PHOSPHORUS: Phosphorus: 3.6 mg/dL (ref 2.3–4.6)

## 2010-04-25 LAB — GLUCOSE, CAPILLARY
Glucose-Capillary: 146 mg/dL — ABNORMAL HIGH (ref 70–99)
Glucose-Capillary: 150 mg/dL — ABNORMAL HIGH (ref 70–99)
Glucose-Capillary: 156 mg/dL — ABNORMAL HIGH (ref 70–99)
Glucose-Capillary: 157 mg/dL — ABNORMAL HIGH (ref 70–99)
Glucose-Capillary: 163 mg/dL — ABNORMAL HIGH (ref 70–99)
Glucose-Capillary: 168 mg/dL — ABNORMAL HIGH (ref 70–99)
Glucose-Capillary: 173 mg/dL — ABNORMAL HIGH (ref 70–99)
Glucose-Capillary: 187 mg/dL — ABNORMAL HIGH (ref 70–99)

## 2010-04-25 LAB — CK TOTAL AND CKMB (NOT AT ARMC)
CK, MB: 3.4 ng/mL (ref 0.3–4.0)
Total CK: 403 U/L — ABNORMAL HIGH (ref 7–232)

## 2010-04-25 LAB — COMPREHENSIVE METABOLIC PANEL WITH GFR
ALT: 33 U/L (ref 0–53)
AST: 27 U/L (ref 0–37)
Albumin: 3.6 g/dL (ref 3.5–5.2)
Alkaline Phosphatase: 51 U/L (ref 39–117)
BUN: 14 mg/dL (ref 6–23)
CO2: 30 meq/L (ref 19–32)
Calcium: 9.5 mg/dL (ref 8.4–10.5)
Chloride: 103 meq/L (ref 96–112)
Creatinine, Ser: 0.95 mg/dL (ref 0.4–1.5)
GFR calc non Af Amer: >60 mL/min
Glucose, Bld: 155 mg/dL — ABNORMAL HIGH (ref 70–99)
Potassium: 3.7 meq/L (ref 3.5–5.1)
Sodium: 140 meq/L (ref 135–145)
Total Bilirubin: 0.6 mg/dL (ref 0.3–1.2)
Total Protein: 6 g/dL (ref 6.0–8.3)

## 2010-04-25 LAB — POCT CARDIAC MARKERS: Myoglobin, poc: 83.1 ng/mL (ref 12–200)

## 2010-04-25 LAB — CBC
MCHC: 35.2 g/dL (ref 30.0–36.0)
RDW: 13.4 % (ref 11.5–15.5)

## 2010-04-25 LAB — MAGNESIUM: Magnesium: 1.9 mg/dL (ref 1.5–2.5)

## 2010-05-23 LAB — COMPREHENSIVE METABOLIC PANEL
ALT: 23 U/L (ref 0–53)
AST: 15 U/L (ref 0–37)
AST: 18 U/L (ref 0–37)
Albumin: 3.5 g/dL (ref 3.5–5.2)
Alkaline Phosphatase: 55 U/L (ref 39–117)
CO2: 30 mEq/L (ref 19–32)
Chloride: 102 mEq/L (ref 96–112)
Chloride: 104 mEq/L (ref 96–112)
Creatinine, Ser: 1.26 mg/dL (ref 0.4–1.5)
GFR calc Af Amer: >60 mL/min (ref >60)
GFR calc Af Amer: >60 mL/min (ref >60)
GFR calc non Af Amer: 56 mL/min — ABNORMAL LOW (ref >60)
Potassium: 3.5 mEq/L (ref 3.5–5.1)
Total Bilirubin: 0.6 mg/dL (ref 0.3–1.2)
Total Bilirubin: 0.6 mg/dL (ref 0.3–1.2)

## 2010-05-23 LAB — CARDIAC PANEL(CRET KIN+CKTOT+MB+TROPI)
CK, MB: 2.6 ng/mL (ref 0.3–4.0)
Relative Index: 1.3 (ref 0.0–2.5)
Total CK: 208 U/L (ref 7–232)
Troponin I: <0.01 ng/mL (ref 0.00–0.06)

## 2010-05-23 LAB — POCT I-STAT, CHEM 8
BUN: 26 mg/dL — ABNORMAL HIGH (ref 6–23)
Chloride: 105 mEq/L (ref 96–112)
Creatinine, Ser: 1.3 mg/dL (ref 0.4–1.5)
Glucose, Bld: 174 mg/dL — ABNORMAL HIGH (ref 70–99)
Potassium: 3.8 mEq/L (ref 3.5–5.1)
Sodium: 139 mEq/L (ref 135–145)

## 2010-05-23 LAB — CBC
HCT: 42 % (ref 39.0–52.0)
MCV: 95.3 fL (ref 78.0–100.0)
MCV: 97.1 fL (ref 78.0–100.0)
Platelets: 274 10*3/uL (ref 150–400)
RBC: 4.33 MIL/uL (ref 4.22–5.81)
WBC: 5.7 10*3/uL (ref 4.0–10.5)
WBC: 6.1 10*3/uL (ref 4.0–10.5)

## 2010-05-23 LAB — GLUCOSE, CAPILLARY
Glucose-Capillary: 137 mg/dL — ABNORMAL HIGH (ref 70–99)
Glucose-Capillary: 166 mg/dL — ABNORMAL HIGH (ref 70–99)
Glucose-Capillary: 187 mg/dL — ABNORMAL HIGH (ref 70–99)

## 2010-05-23 LAB — CK TOTAL AND CKMB (NOT AT ARMC)
CK, MB: 3.3 ng/mL (ref 0.3–4.0)
Total CK: 168 U/L (ref 7–232)

## 2010-05-23 LAB — BRAIN NATRIURETIC PEPTIDE: Pro B Natriuretic peptide (BNP): 31 pg/mL (ref 0.0–100.0)

## 2010-05-23 LAB — POCT CARDIAC MARKERS
CKMB, poc: 1.7 ng/mL (ref 1.0–8.0)
Myoglobin, poc: 127 ng/mL (ref 12–200)
Troponin i, poc: <0.05 ng/mL (ref 0.00–0.09)

## 2010-05-23 LAB — LIPID PANEL: HDL: 25 mg/dL — ABNORMAL LOW (ref >39)

## 2010-06-25 NOTE — Discharge Summary (Signed)
NAMEHILLARY, [name]               ACCOUNT NO.:  [id number]   MEDICAL RECORD NO.:  [id number]          PATIENT TYPE:  INP   LOCATION:  4738                         FACILITY:  [facility]   PHYSICIAN:  [name]. [name], MD     DATE OF BIRTH:  [birth year]   DATE OF ADMISSION:  04/16/2008  DATE OF DISCHARGE:  04/18/2008                               DISCHARGE SUMMARY   FINAL DIAGNOSIS:  Admitted April 16, 2008, via the emergency room with an  episode of presyncope.  He was sitting in church felt diaphoretic,  nauseated, and dizzy.  He had some mild retching and emesis.  He did not  pass out.  He was brought to the emergency room by EMS when bystanders  called for help.  He denied any chest pain.  He has a history of  nonobstructive coronary disease on catheterization in 2004.  He has been  seen by Dr. Riley Kill and Dr. Daleen Squibb in the past.  He takes 81 mg aspirin  daily and practices secondary risk factor reduction strategy.  His EKG  showed normal sinus rhythm and nonspecific ST-T wave changes.  He was  admitted for further evaluation and treatment.   PAST MEDICAL HISTORY:  [name] has a history of hypertension, which  is controlled on lisinopril and Norvasc.  He has hyperlipidemia, which  he chooses to treat with diet alone.  His last lipid profile in July  2009 showed cholesterol of 158, triglyceride 162, HDL 31, LDL 95.  He  was not inclined to take statin drugs.  He has a history of mild  diabetes, controlled on metformin.  He was previously taking this twice  a day but cut it down to once a day when his blood sugar dropped to 70.  His A1c has been in the sixes.  He has a history of gastritis and was  previously on PPI therapy, but stopped this on his own.  He has a  history of diverticulosis and colon polyps with his last colonoscopy in  2005 by Dr. Marina Goodell showing one diminutive polyp and followup planned in 5  years.  He has a history of kidney stones in the past and required  double-J  stenting and lithotripsy by Dr. Wanda Plump in 2008.  He has a  history of pyelonephritis as well.  He has a history of degenerative  arthritis and some mild anxiety.   PHYSICAL EXAMINATION:  Physical examination at the time of admission  revealed a 76 year old gentleman in no acute distress.  Blood pressure  146/70, pulse 90 and regular, respirations 18 per minute, O2 sat 98% on  room air, temperature 98 degrees.  HEENT exam was unremarkable.  Chest  was clear to percussion and auscultation.  Cardiac exam revealed a  regular rate and rhythm.  No murmurs, rubs, or gallops heard.  The  abdomen was soft and nontender without evidence of organomegaly or  masses.  Extremities showed no cyanosis, clubbing, or edema.  Neurologic  exam was intact without focal abnormalities detected.  Skin exam was  negative.   LABORATORY DATA:  EKG showed normal  sinus rhythm and minor nonspecific  ST-T wave changes.  Chest x-ray showed some minimal scarring at the  bases.  Clear lungs without active infiltrates and normal heart size.  Hemoglobin 15, hematocrit 43.8, white count 6100.  Sodium 136, potassium  3.5, chloride 101, CO2 of 29, BUN 19, creatinine 1.2, blood sugar 119.  Total bilirubin 0.6, alk phos 55, SGOT 18, SGPT 23.  Total protein 6.1,  albumin 3.5, calcium 8.8.  Cardiac enzymes revealed a negative CPK, MBs,  and troponins.  Fasting lipid profile showed cholesterol 154,  triglycerides 177, HDL 25, LDL 94.  BNP was 31.   HOSPITAL COURSE:  The patient was admitted with a presyncopal spell.  He  was placed on a cardiac monitor and put on a telemetry floor.  He was  ambulated and had no cardiac arrhythmias noted during his hospital stay.  The patient was seen in consultation by Dr. Valera Castle, the Cardiology  Service.  Dr. Daleen Squibb recommended cycling of cardiac enzymes, which were  done and were negative.  He also recommended a 2-D echo, which was  performed.  The 2-D echo showed similar to previous  studies revealing  mild left ventricular dysfunction with mild hypokinesis in the distal  anterior wall and ejection fraction in the 50-55% range; aortic valve  thickness was mildly increased, and there was some mild mitral  regurgitation.   It was felt that the patient should have another nuclear cardiac scan.  It was determined that he was safe for discharge and this will be  arranged as an outpatient with followup by Dr. Daleen Squibb.   CONDITION ON DISCHARGE:  Improved.   MEDICATIONS ON DISCHARGE:  1. Aspirin 81 mg p.o. daily.  2. Lisinopril and hydrochlorothiazide 20/12.5 one tablet daily.  3. Norvasc 10 mg p.o. daily.  4. Metformin 500 mg p.o. q.a.m.  5. Flomax 0.4 mg p.o. nightly.  6. Prilosec OTC 20 mg p.o. daily.   DISPOSITION:  The patient is being discharged to home.  We will arrange  for an outpatient Cardiolite and followup with Dr. Daleen Squibb.      [name]. [name], MD  Electronically Signed     SMN/MEDQ  D:  04/18/2008  T:  04/18/2008  Job:  [id number]

## 2010-06-25 NOTE — Op Note (Signed)
[name], [name]               ACCOUNT NO.:  [id number]   MEDICAL RECORD NO.:  [id number]          PATIENT TYPE:  AMB   LOCATION:  [facility]                         FACILITY:  [facility]   PHYSICIAN:  Boston Service, M.D.DATE OF BIRTH:  1934-03-28   DATE OF PROCEDURE:  12/29/2006  DATE OF DISCHARGE:  12/29/2006                               OPERATIVE REPORT   PREOPERATIVE DIAGNOSIS:  A 7-mm renal, 7-mm right ureteropelvic junction  stone.   POSTOPERATIVE DIAGNOSIS:  A 7-mm renal, 7-mm right ureteropelvic  junction stone.   PROCEDURES:  1. Cystoscopy.  2. Retrograde.  3. Double J stent placement.   SURGEON:  [place] Service, M.D.   ASSISTANT:  None.   ANESTHESIA:  General.   FINDINGS:  A 7-mm right renal stone, a 7-mm right UPJ stone.   SPECIMENS:  None.   FINDINGS:  Tight stenosis, 7-mm right UPJ stone.  Unable to dislodge the  stone into the renal pelvis.  Double J stent placed without  difficulties.   COMPLICATIONS:  None obvious.   DESCRIPTION OF PROCEDURE:  The patient was prepped and draped in the  dorsal lithotomy position after institution of an adequate level of  general anesthesia.  A well-lubricated 21-French panendoscope was gently  inserted at the urethral meatus.  Normal urethra and sphincter.  Coapting lateral lobes of the prostate.  Orifices quite close to the  prostatic urethra, effluxing clear urine.   A blocking catheter was selected, retrogrades obtained on right and left  sides.  Normal course and caliber of the ureter, pelvis and calyces on  the left.  The patient had hydronephrosis due to 7-mm right UPJ stone on  the right.  Glidewire was then inserted and negotiated beyond the stone.  With gentle injection of contrast, the ureteral catheter was unable to  displace the stone into the pelvis.  A 6-French 26-cm double J stent was  selected, passed over the  indwelling Glidewire with excellent pigtail formation on Glidewire  removal.  The bladder  was drained.  The cystoscope was removed.  The  patient was returned to recovery in satisfactory condition.  Plans for  ESWL later in the week.           ______________________________  [place] Service, M.D.     RH/MEDQ  D:  01/19/2007  T:  01/19/2007  Job:  [id number]

## 2010-06-25 NOTE — H&P (Signed)
[name], [name]               ACCOUNT NO.:  [id number]   MEDICAL RECORD NO.:  [id number]          PATIENT TYPE:  INP   LOCATION:  4731                         FACILITY:  [facility]   PHYSICIAN:  Charlcie Cradle. Delford Field, MD, FCCPDATE OF BIRTH:  1934-04-[birth year]   DATE OF ADMISSION:  04/16/2008  DATE OF DISCHARGE:                              HISTORY & PHYSICAL   CHIEF COMPLAINT:  Presyncopal episode.   HISTORY OF PRESENT ILLNESS:  This is a 76 year old male patient followed  by Dr. Alroy Dust for primary care presents today on April 16, 2008,  reporting he was in his usual state of health until while he was in  church this morning and developed acute onset of nausea, cold sweats,  and a sensation that he was going to pass out.  He remained dizzy and  lightheaded for some time.  Following this, he sat down outside for some  time, before he returned to baseline.  Therefore, he reported to the  emergency room with these complaints.  Upon exam, he currently is  without complaint.  He is resting comfortably in the bed.  He denies  dyspnea, chest pain, chest pressure, indigestion, heart palpitations,  cough, sick exposure, or any other pertinent positives at this time.  However, he does report that he has had intermittent left arm and  shoulder pain over the last 2 weeks.  He is not sure if this happens  with exertion and cannot connect it to any sort of activity; however, it  typically resolves spontaneously without inpatient intervention.  He  presented to the emergency room.  Initial blood pressures in the 176/77  range.  A 12-lead EKG was obtained.  This did demonstrate Q waves in the  inferior leads, however, evaluation of prior x-rays dating back to 2008  demonstrate these same findings.  Initial cardiac enzyme markers were  negative.  Given presentation, it was felt by the emergency room staff  that he would need further evaluation.  He will be admitted for further  evaluation to try to identify  potential cause of these symptoms.   PAST MEDICAL HISTORY:  1. Diabetes type 2.  2. Hypertension.  3. Dyslipidemia.  4. Benign prostatic hypertrophy.  5. Coronary artery disease.  6. Gastritis.  7. Diverticulosis of colon.  8. Colon polyps.  9. Renal calculi.  10.History of pyelonephritis.  11.History of degenerative joint disease.  12.History of anxiety.   CURRENT ALLERGIES:  CODEINE.   PAST SURGICAL HISTORY:  He has had excision of lipoma on GE junction  after exploratory laparotomy in 1986, status post bilateral knee  surgeries, appendectomy, cataract surgery, cholecystectomy.   FAMILY HISTORY:  Reviewed and without change.   SOCIAL HISTORY:  Never smoked.  No EtOH.  He is a widower x3 years,  currently retired and active at home.   CURRENT MEDICATIONS:  Current medication list from Dr. Jodelle Green office,  1. Lisinopril/hydrochlorothiazide 20/12.5 one p.o. daily.  2. Norvasc 10 mg p.o. daily.  3. Aspirin 81 mg p.o. daily.  4. Metformin 500 mg in the morning only.  5. Flomax 0.4 mg daily.  6. Cialis p.r.n.   REVIEW OF SYSTEMS:  Per HPI.   PHYSICAL EXAMINATION:  VITAL SIGNS:  Temperature 98.1, heart rate 85,  respirations 18, blood pressure 140/79, saturation 100% on 2 liters.  HEENT:  Normocephalic.  No JVD or adenopathy.  GENERAL:  Resting comfortably without distress.  PULMONARY:  Clear to auscultation.  CARDIAC:  Regular rate and rhythm without murmur, rub, or gallop.  ABDOMEN:  Soft, nontender.  EXTREMITIES:  Without edema, and demonstrate 2+ pulses with good  perfusion.   A 12-lead EKG demonstrates inferior Q wave changes, which were present  in 2008.   LABORATORY DATA:  Sodium 139, potassium 3.8, chloride 105, BUN 26,  creatinine 1.3, glucose 174, CK-MB 1.7, troponin-I 0.05, hemoglobin  16.7, hematocrit 49.   IMPRESSION AND PLAN:  1. Presyncopal episode.  Uncertain etiology.  Plan will be to admit to      telemetry, ask Cardiology to evaluate.  He has been  seen by       in the past.  Treat hypertension, at low dose Lopressor, obtain 2-D      echocardiogram, and cycle cardiac enzymes.  2. Hypertension.  Plan for this is to continue home medications.  3. Diabetes type 2.  Plan for this is to continue metformin, as well      as sliding-scale insulin.  Best Practice will place him on proton      pump inhibitors and subcutaneous heparin for DVT prophylaxis.      Zenia Resides, NP      Charlcie Cradle. Delford Field, MD, [facility]  Electronically Signed    PB/MEDQ  D:  04/16/2008  T:  04/17/2008  Job:  [id number]

## 2010-06-25 NOTE — Discharge Summary (Signed)
NAMEGABRIELL, CASIMIR               ACCOUNT NO.:  [id number]   MEDICAL RECORD NO.:  [id number]          PATIENT TYPE:  INP   LOCATION:  4738                         FACILITY:  [facility]   PHYSICIAN:  [name]. [name], MD     DATE OF BIRTH:  05-Nov-1934   DATE OF ADMISSION:  04/16/2008  DATE OF DISCHARGE:                               DISCHARGE SUMMARY   FINAL DIAGNOSIS:  Admitted on April 16, 2008, by the emergency room with   DICTATION ENDS AT THIS POINT      [name]. [name], MD  Electronically Signed     SMN/MEDQ  D:  04/18/2008  T:  04/18/2008  Job:  [id number]

## 2010-06-25 NOTE — Consult Note (Signed)
NAMEJESSICA, SEIDMAN NO.:  [id number]   MEDICAL RECORD NO.:  [id number]          PATIENT TYPE:  INP   LOCATION:  1826                         FACILITY:  [facility]   PHYSICIAN:  Jonelle Sidle, MD DATE OF BIRTH:  [birth year]   DATE OF CONSULTATION:  04/16/2008  DATE OF DISCHARGE:                                 CONSULTATION   REFERRING PHYSICIAN:  [name]. [name], M.D.   REASON FOR CONSULTATION:  Presyncope and nausea.   HISTORY OF PRESENT ILLNESS:  Mr. Klugh is a pleasant 76 year old  gentleman seen in the past by Dr. Riley Kill and Dr. Daleen Squibb with a history of  nonobstructive coronary artery disease documented at cardiac  catheterization in 2004.  At that time he was experiencing chest pain  and was noted to have a left ventricular ejection fraction of 50-55%  without major obstructive coronary artery disease, with the most  significant lesions being 40-50% disease within the circumflex and 30-  40% disease in the distal right coronary artery.  He has been managed  medically and was seen in consultation with chest pain that was felt to  be noncardiac back in 2009.  It is not clear that he had followup  ischemic testing over the years, but states he has been in usual state  of health except he has been experiencing a sharp discomfort in his  left arm and shoulder, sometimes his right shoulder, sporadic and to him  feeling like a flare of arthritic pain.  These are not specifically  exertional in nature but have been worse over the last 2 weeks over  baseline.  This morning, he was sitting in church listening to the  sermon when he began to suddenly feel diaphoretic, nauseated and dizzy  as if he might pass out.  He actually states that he had some mild  retching and limited degree of emesis and went outside to get some fresh  air.  He was attended to and ultimately transported the emergency  department by EMS.  His presenting vital signs revealed him to be  hypertensive with heart rate in the 90s and non-hypoxic with saturation  of 98% on room air.  He is being admitted to the hospital now for  further observation on telemetry.  His electrocardiogram shows sinus  rhythm with inferior Q-waves and also poor R-wave progression suggestive  of prior anterior infarct.  Old tracing from February 2009 showed  similar changes.  I do not have a remote tracing for comparison.  The  initial cardiac markers are normal.  Troponin I level of less than 0.05.  Chest x-ray reports no acute findings.   ALLERGIES:  CODEINE and MORPHINE.   PRESENT MEDICATIONS:  1. Norvasc 10 mg p.o. daily.  2. Aspirin 320 mg p.o. daily.  3. Heparin 5000 units subcu q.8 hours.  4. Hydrochlorothiazide 12.5 mg p.o. daily.  5. Zestril 20 mg p.o. daily.  6. Glucophage 5 mg p.o. daily.  7. Lopressor 50 mg p.o. b.i.d.  8. Zofran 4 mg p.r.n.  9. Protonix 40 mg p.o. daily.  10.Flomax 0.4 mg p.o. daily.  PAST MEDICAL HISTORY:  As outlined above.   ADDITIONAL PROBLEMS:  Include hypertension, type diabetes mellitus,  hyperlipidemia, benign prostatic hypertrophy, previously diagnosed  nephrolithiasis with episode of acute pyelonephritis in 2008, gastritis,  diverticulitis and colonic polyps, left subacromial decompression in  2007, cholecystectomy, appendectomy, resection of Meckel's diverticulum.   SOCIAL HISTORY:  The patient lives in [place].  He is a widower.  He  has no active tobacco or alcohol use history.  He enjoys hunting.   FAMILY HISTORY:  Reviewed and is noncontributory.   REVIEW OF SYSTEMS:  As detailed above.  No clearly exertional  reproducible chest pain or breathlessness.  He has had some left arm and  bilateral shoulder sharp pains recently over the last 2 weeks. Fairly  sporadic and described more as an arthritic pain.  He has had no  palpitations, claudication, or syncope.  No frank episodes of dizziness  leading up to the episode this morning.   Otherwise negative.   PHYSICAL EXAMINATION:  VITAL SIGNS:  Blood pressure is 146/70, heart  rate is 90 in sinus rhythm.  Oxygen saturation is 98% on room air.  Patient afebrile with temperature of 98.1 degrees.  CONSTITUTIONAL:  He is comfortable in no acute distress without active  symptoms.  HEENT:  Conjunctivae normal.  Pharynx clear.  NECK:  Supple.  No elevated jugular venous pressure, loud bruits or  thyromegaly.  LUNGS:  Clear with diminished breath sounds.  CARDIAC:  Regular rate and rhythm.  No loud systolic murmur or  pericardial rub.  No S3 gallop.  ABDOMEN:  Soft, nontender.  Normoactive bowel sounds.  EXTREMITIES:  Exhibit no frank pitting edema.  Distal pulses are 1+.  SKIN:  Warm and dry.  MUSCULOSKELETAL:  No kyphosis noted.  NEUROPSYCHIATRIC:  The patient alert and oriented x3.  Affect is  appropriate.   INITIAL LABORATORY DATA:  Hemoglobin 16.7, hematocrit 49.0, sodium 139,  potassium 3.8, chloride 105, glucose 174, BUN 26, creatinine 1.3, point  of care CK-MB 1.7 and troponin I less than 0.05 with a myoglobin of 127.   IMPRESSION:  1. Recent episode of presyncope, sudden in onset, and preceded by      diaphoresis and nausea.  This occurred at rest without obvious      inciting factor and has not been a typical complaint for him.  He      did not have frank syncope and has been hemodynamically stable with      sinus rhythm on telemetry, and normal initial point of care cardiac      markers.  His only other recent symptoms have included a sharp      discomfort in the left arm and bilateral shoulders, sporadic, and      not necessarily exertional.  He does have a history of      nonobstructive coronary disease based on cardiac catheterization in      2004 with low normal ejection fraction of 50-55% at that time.  No      other recent ischemic testing has been done.  2. Hypertension.  3. Diabetes mellitus.  4. Hyperlipidemia.   RECOMMENDATIONS:  Agree with  admission for further evaluation.  Would  keep on telemetry to exclude potential dysrhythmias.  Would also cycle  full set of cardiac markers.  His resting electrocardiogram is abnormal  and suggestive of ischemic heart disease, possibly with previous  infarct, although this has never been clearly  documented in the past.  I would think that at  a minimum he will need a  followup Myoview assuming he remains clinically stable with no other  high risk features.  Otherwise we could consider further invasive  inpatient evaluation.  Our service will follow with you.      Jonelle Sidle, MD  Electronically Signed     SGM/MEDQ  D:  04/16/2008  T:  04/16/2008  Job:  [id number]   cc:   [name]. [name], MD

## 2010-06-25 NOTE — H&P (Signed)
NAMECHAUNCE, WINKELS               ACCOUNT NO.:  [id number]   MEDICAL RECORD NO.:  [id number]          PATIENT TYPE:  INP   LOCATION:  3742                         FACILITY:  [facility]   PHYSICIAN:  Olene Craven, M.D.  DATE OF BIRTH:  Jul 28, 1934   DATE OF ADMISSION:  03/16/2007  DATE OF DISCHARGE:  03/19/2007                              HISTORY & PHYSICAL   PRIMARY CARE PHYSICIAN:  [name]. [name], M.D.   CHIEF COMPLAINT:  Nausea, vomiting, diarrhea and chest pain.   HPI:  Mr. Halm is a 76 year old man with a history of nonobstructive  coronary artery disease, hypertension, type 2 diabetes, hyperlipidemia,  who presented to the [facility] with complaints of  nausea, vomiting and  diarrhea that started on the morning of admission.  He was in his usual state of health up until 12:30 on a.m. of admission,  when he was awakened with a 10/10 stabbing substernal chest pain,  radiating to both of his shoulders and his neck.  He denied accompanying  dyspnea, heart palpitations or sense of impending doom, but he did  become dizzy and nauseated.  Shortly thereafter, he started vomiting  food content without blood or coffee-ground and having diarrhea, which  he describes as liquid and black stool.  He tried a 325 mg tab of  aspirin at home without much relief.  His pain has persisted since its  onset and he has not found any significant relief, despite oxygen,  morphine, Dilaudid, Zofran and Protonix.  His pain is still 10/10.  Of  note, he vomited about five times since his illness started (once during  our encounter) and had about five bowel movements, the last of which  happened on his way to the hospital, at which time he had bowel  incontinence.   ALLERGIES:  CODEINE and MORPHINE.   PAST MEDICAL HISTORY:  1. Coronary artery disease, nonobstructive.      a.     Status post heart catheterization in 1983; no access to the       procedure report.      b.      Negative Cardiolite in December 2001 (was having       intermittent chest pain).      c.     Nonobstructive cath in 2004 (had had chest pain times four       days).      d.     Left ventricular ejection fraction 50-55% in 2004.  2. Hypertension.  3. Type 2 diabetes.  4. Hyperlipidemia.  5. Benign prostatic hypertrophy, uncomplicated.  6. Nephrolithiasis      a.     complicated by acute pyelonephritis in June of 2008, status       post double-J placement (right ureteral stone, 9 mm).      b.     Status post double-J in November of 2008 (right junction       stone, 7 mm).  7. History of gastritis.  8. History of diverticulitis and colonic polyps.  9. Status post left subacromial decompression in March of 2007.  10.Status  post cholecystectomy.  11.Status post appendectomy.  12.Status post Meckel's diverticulum resection.   HOME MEDICATIONS:  1. Lisinopril 20 mg p.o. daily.  2. HCTZ 12.5 mg p.o. daily.  3. Norvasc 10 mg p.o. daily.  4. Metformin 500 mg p.o. b.i.d.  5. Aspirin 325 mg p.o. daily.   SOCIAL HISTORY:  Patient lives in [place], he is a widower.  His  sister works in admissions at the hospital.  He had one daughter and one  grand-daughter present at bedside.  He is a nonsmoker and does not drink  alcohol or use drugs.  The most physical activity he does is rabbit  hunting.   FAMILY HISTORY:  Noncontributory.   REVIEW OF SYSTEMS:  Patient denies fevers, chills, sweats, headache,  skin lesions, dyspnea, dyspnea on exertion, orthopnea, paroxysmal  nocturnal dyspnea, edema, heart palpitations, syncope, claudication and  wheezing.  He endorsed urinary straining without irritative symptoms.  Otherwise, he had no abdominal pain, heartburn, dysphagia or  odynophagia.   PHYSICAL EXAM:  Temperature 97, heart rate 107, respiratory rate 24,  blood pressure 129/76, O2 sat 94% on room air.  GENERAL:  Uncomfortable-appearing, anxious.  HEENT:  PERRLA.  Mucous membranes dry.   Oropharynx without erythema or  exudates.  NECK:  Supple without lymphadenopathy, thyromegaly, carotid bruits or  JVD.  CARDIOVASCULAR:  Tachycardic, but regular.  Normal S1, S2, no S3 or S4,  no murmurs, rubs or gallops.  Peripheral pulses 2+ symmetrically without  vascular bruits.  LUNGS:  Clear to auscultation bilaterally with good air movement.  SKIN:  No rash or lesions.  ABDOMEN:  Bowel sounds positive.  Abdomen soft, nontender, nondistended.  No palpable masses or organomegaly.  RECTAL:  Normal sphincter tone, no masses.  Prostate smooth without  nodules, but mildly enlarged.  Loose brown stool on glove.  No blood or  melena.  EXTREMITIES:  No cyanosis, clubbing or edema.  NEUROLOGIC:  Patient awake, alert and oriented times three.  Cranial  nerves grossly intact.  Cerebellar function normal.   RADIOLOGY:  Chest x-ray on February 3 without acute findings.   EKG:  Sinus tachycardia with a rate of 105 beats per minute.  Normal  axis.  Q-waves in 2, 3, AVF, 1 and V6.  No ischemic changes, LVH noted.   LABORATORY DATA:  White blood count 12.4 with an ANC of 11.5, hemoglobin  15.6, platelets 288, RDW 14.8%.  Sodium 139, potassium 4, chloride 106,  bicarb 24, BUN 24, creatinine 1.3 (creatinine was 1.03 in November  2008), glucose 206.   Point of care markers:  MB less than 1, troponins less than 0.05,  Hemoccults negative.   ASSESSMENT AND PLAN:  1. Atypical chest pain in a patient with no nonobstructive coronary      artery disease.  Mr. Paullin chest pain seems related to his      gastrointestinal issues.  His EKG is reassuring in that he has no      acute ischemic changes.  In addition, his point of care markers      have been negative.  We will recommend repeating an EKG and      following serial cardiac enzymes.  However, I would not pursue      further workup unless his enzymes increase or he develops EKG      changes.  We will continue with medical management and  continue      aspirin, now that we know his Hemoccult is negative.  A Gastroccult  is also pending.  2. Vomiting and diarrhea.  Given the acute onset of his illness, Mr.      Maniscalco could certainly be having a viral gastroenteritis or food      poisoning.  Although I did have a concern for possible melena when      he described having loose, black stool, his rectal exam and      negative Hemoccult were reassuring.  We will continue supportive      management with IV fluid resuscitation, Zofran for nausea, as well      as morphine or Dilaudid for pain relief.  Of note, although he has      an allergy to morphine listed, this was actually more of an      intolerance, rather than an allergy.  We will follow his      electrolytes and creatinine to insure that he does not become      dehydrated.  If the vomiting and diarrhea persist for more than 24      hours, we will consider further workup, such as stool cultures and      endoscopy.  We will treat patient with a daily PPI for possible      underlying gastritis, especially since patient has a history of it.      If he does have gastritis, this would not be alcohol-induced, per      history.  However, there is a possibility that it would be NSAID-      induced.  I am not convinced of a gastritis diagnosis at this time,      given the acuity of his complaints.  3. Hypertension.  We will continue lisinopril, Norvasc and HCTZ as      tolerated per his blood pressure.  4. Diabetes mellitus, type 2.  We will hold metformin, given that he      may require contrast while hospitalized.  We will start a sensitive      insulin sliding scale and resume metformin at discharge.  5. Hyperlipidemia.  It seems as though the patient is no longer      treated for his hyperlipidemia, so we will check a fasting lipid      profile to make sure that he does not need to be restarted on      treatment.  6. Benign prostatic hypertrophy.  Patient has known BPH,  but does not      have any complications associated with it.  We will consider      placing a Foley catheter until his vomiting and diarrhea resolve.      Olene Craven, M.D.  Electronically Signed     MC/MEDQ  D:  03/16/2007  T:  03/29/2007  Job:  [id number]

## 2010-06-25 NOTE — Discharge Summary (Signed)
NAMECHEVON, LAUFER               ACCOUNT NO.:  [id number]   MEDICAL RECORD NO.:  [id number]          PATIENT TYPE:  INP   LOCATION:  3742                         FACILITY:  [facility]   PHYSICIAN:  [name]. [name], MD     DATE OF BIRTH:  1934-11-06   DATE OF ADMISSION:  03/16/2007  DATE OF DISCHARGE:  03/19/2007                               DISCHARGE SUMMARY   FINAL DIAGNOSES:  1. Admitted March 16, 2007 with severe substernal chest pain.  The      patient was seen by cardiology who felt the pain was noncardiac in      origin.  He was seen by gastroenterology, and underwent endoscopy      that showed hiatus hernia, reflux esophagitis, and duodenitis.  He      had had a previous cholecystectomy and ultrasound that showed      dilated common bile duct.  Hida scan was normal.  Dr. Lina Sar      felt he probably passed common duct stone and/or sludge.  He was      treated with proton pump inhibitors and discharged on March 19, 2007.  Follow up with Dr. Juanda Chance in the office.  2. History of hypertension - controlled on medications.  3. Coronary artery disease which was nonobstructive in nature and      treated with aspirin and secondary risk factor reduction.  4. Hyperlipidemia on diet alone.  5. Diabetes - on metformin.  6. History of hiatus hernia, reflux esophagitis, and gastritis as      noted.  7. History of diverticulosis.  8. History of colon polyps with last colonoscopy in 2005 by Dr. Marina Goodell.  9. History of kidney stones.  10.History of pyelonephritis with hospitalization in April 2008      requiring cystoscopy stent and laser fragmentation of stone.  11.History of degenerative arthritis.  12.Anxiety.   BRIEF HISTORY AND PHYSICAL:  The patient is a 76 year old gentleman  known to me with problems as noted above.  He presented to the emergency  room with severe chest pain rated 10/10 that woke him up at 12:30 a.m.  on the day of admission.  The pain radiated to his neck  and shoulders.  It was associated with dizziness and then nausea, vomiting, and  diaphoresis.  He denied shortness of breath, palpitations or syncope.  He came to the emergency room and was seen by Dr. Effie Shy of the ER staff.  He was given morphine and Dilaudid without relief of his chest pain.  He  was given Zofran and Protonix as well.  He was referred for admission  because of known coronary disease.  The patient had a cardiac  catheterization in 2004 showing nonobstructive coronary disease.  He has  been on aspirin daily and secondary risk factor reduction strategy.   PAST MEDICAL HISTORY:  As noted the patient has a history of  hypertension controlled on lisinopril with HCT and Norvasc.  He has a  history of coronary artery disease with nonobstructive disease on cath  in June 2004.  He  has a history of borderline hyperlipidemia, and has  been able to control this on diet alone.  He has mild diabetes mellitus  for which he takes metformin with good control.  He has a history of  gastritis in the past.  He has known diverticulosis and colon polyps  with last colonoscopy in 2005 and follow-up planned in five years.  He  has a history of kidney stones and was last hospitalized in April 2008  with pyelonephritis from an obstructing right renal stone requiring  cystoscopy, laser fragmentation, and stent placement.  He has known  degenerative arthritis.  He has a history of anxiety.   PHYSICAL EXAMINATION AT ADMISSION:  GENERAL:  Revealed a 76 year old  gentleman in no acute distress.  VITAL SIGNS:  Blood pressure 130/70, pulse 100 per minute regular,  respirations 20 per minute not labored, O2 sat 98% on oxygen,  temperature 98 degrees.  HEENT:  Unremarkable.  NECK:  Showed no jugular venous distention, no carotid bruits.  CHEST:  Clear to percussion and auscultation.  CARDIAC:  Nontender with a regular rhythm, grade 1/6 systolic ejection  murmur at left sternal border without rubs or  gallops heard.  ABDOMEN:  Soft with minimal epigastric discomfort on palpation.  No  evidence of organomegaly or masses.  EXTREMITIES:  Showed no cyanosis,  clubbing or edema.  NEUROLOGIC:  Intact.   LABORATORY DATA:  EKG showed sinus tachycardia, Q waves in leads 3 and  aVF compatible with an old inferior infarct, nonspecific ST-T wave  changes.  Chest x-ray showed normal heart size and no infiltrates, mild  right basilar atelectasis.  CT of the chest was negative, minimal  scarring and/or atelectasis at the bases.  No evidence for pulmonary  emboli.  Abdominal ultrasound showed previous cholecystectomy, dense  liver with no focal lesions.  HIDA scan was negative.   Hemoglobin 15.6, hematocrit 46.2, white count 12,400 with 92% segs.  Sodium 139, potassium 4, chloride 106, CO2 24, BUN 22, creatinine 1.2.  Blood sugar 167.  Calcium 8.7.  Total protein 6.5.  Albumin 3.6.  AST  249, ALT 231.  Alk phos 86, total bilirubin 2.1.  Repeat numbers showed  AST 35, ALT 70.  Alk phos 62, bilirubin 0.7.  Hemoglobin A1c 6.7.  CPK  128 with negative MB and negative troponin.  Fasting lipid profile  showed cholesterol 112, triglycerides 72, HDL 27, LDL 71.  Hepatitis  panel was negative.   HOSPITAL COURSE:  The patient was admitted with severe substernal chest  pain and a history of nonobstructive coronary disease from cath in 2004.  He was seen in consultation by Dr. Daleen Squibb for cardiology.  He felt the  pain was noncardiac in origin, and as noted his serial EKGs and enzymes  were negative.  He had a CT chest that showed no evidence of pulmonary  emboli or acute lung problems.  He was seen in consultation by Dr. Lina Sar for GI.  She felt that the elevated liver enzymes were likely  related to his acute substernal chest and epigastric discomfort.  She  recommended endoscopy which performed on March 17, 2007.  It showed a  hiatus hernia, reflux esophagitis, and duodenitis.  His proton pump   inhibitor therapy was increased.  His abdominal ultrasound showed  previous cholecystectomy and echodense liver.  She recommended a HIDA  scan which was negative.  By that time his pain had dissipated, and he  was improved.  She recommended discharge on March 19, 2007 with follow-  up in the GI clinic.   MEDICATIONS AT DISCHARGE:  1. Protonix 40 mg p.o. daily.  2. Enteric-coated aspirin 81 mg p.o. daily.  3. Fosinopril/HCT 20/12.5 one tablet daily.  4. Norvasc 10 mg p.o. daily.  5. Metformin 500 mg p.o. b.i.d.  6. Flomax 0.4 mg p.o. q.h.s.   CONDITION ON DISCHARGE:  Improved.   DISPOSITION:  Patient being discharged home.  Will follow with Dr. Lina Sar in the GI clinic in one week.      [name]. [name], MD  Electronically Signed     SMN/MEDQ  D:  04/17/2007  T:  04/19/2007  Job:  [id number]

## 2010-06-25 NOTE — Op Note (Signed)
NAMEWILFRIDO, [name]               ACCOUNT NO.:  [id number]   MEDICAL RECORD NO.:  [id number]          PATIENT TYPE:  AMB   LOCATION:  NESC                         FACILITY:  [facility]   PHYSICIAN:  Boston Service, M.D.DATE OF BIRTH:  14-Feb-1934   DATE OF PROCEDURE:  12/29/2006  DATE OF DISCHARGE:                               OPERATIVE REPORT   PREOPERATIVE DIAGNOSIS:  [age]-year-old white male, 6 months status  post extracorporeal shock wave lithotripsy of a 9-mm right ureteropelvic  junction stone.  __________   DICTATION ENDED AT THIS POINT.           ______________________________  Boston Service, M.D.     RH/MEDQ  D:  12/29/2006  T:  12/30/2006  Job:  [id number]   cc:   [name]. [name], MD  [address]  [place]  [place] [address]

## 2010-06-28 NOTE — H&P (Signed)
[name], [name] NO.:  [id number]   MEDICAL RECORD NO.:  [id number]          PATIENT TYPE:  INP   LOCATION:  5740                         FACILITY:  [facility]   PHYSICIAN:  Christell Faith, MD   DATE OF BIRTH:  1934/08/29   DATE OF ADMISSION:  06/02/2006  DATE OF DISCHARGE:                              HISTORY & PHYSICAL   PRIMARY CARE PHYSICIAN:  [name]. [name], M.D.   CHIEF COMPLAINT:  Dysuria and fever.   HISTORY OF PRESENT ILLNESS:  This is a 77 year old white man with 1 day  of fever and chills, specifically he rigored x1 at approximately 6:00  p.m. yesterday.  He has also noticed yellow pus in his underwear for  approximately the past 1 to 2 days as well as burning with urination.  He denies hematuria, pelvic pain, nausea, vomiting, diarrhea.  No  testicular pain.   REVIEW OF SYSTEMS:  The balance of 10 systems was reviewed and is  negative.   PAST MEDICAL HISTORY:  1. Hypertension.  2. Diabetes.  3. History of a negative heart catheterization in 2004.  4. Severe degenerative joint disease.  5. History of esophagitis.  6. History of Bell's palsy.  7. History of appendectomy.  8. History of cholecystectomy.  9. History of exploratory laparotomy.  10.Urinary tract infection a little over 1 year ago.   SOCIAL HISTORY:  Single, lives in [place].  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  No  tobacco.  No alcohol.   FAMILY HISTORY:  Mother died in her 90s of an MI.  Father lived into his  32s.   MEDICATIONS:  1. Metformin 500 mg p.o. b.i.d.  2. Vytorin 1 p.o. daily.  3. Norvasc  5 mg p.o. daily.  4. Toprol XL 50 mg p.o. daily.  5. Flomax 0.4 mg p.o. daily.  6. Diovan HCT 160/12.5 mg p.o. daily.   ALLERGIES:  OPIATES CAUSE NAUSEA.   PHYSICAL EXAMINATION:  VITAL SIGNS:  Temperature 102.2, blood pressure  144/67, heart rate 122, respiratory rate 18, saturation 99% on room air.  GENERAL:  This is a pleasant older man in no distress.  HEENT:  Pupils are round  and reactive.  Sclerae were clear.  Mucous  membranes were dry.  No scleral icterus.  NECK:  Supple.  No carotid bruits.  LUNGS:  Clear to auscultation bilaterally with no wheezing or rales.  CARDIAC EXAM:  Tachycardia.  Regular rhythm.  Normal S1, S2.  No gallops  or murmurs.  ABDOMEN:  Soft, nontender, nondistended.  No tenderness to palpation  over the pelvis.  EXTREMITIES:  No lower extremity edema.  No rash.  No clubbing or  cyanosis.   DIAGNOSTIC TESTS:  1. Electrocardiogram is normal sinus rhythm with no ischemic changes.  2. Chest x-ray is unremarkable.   LABORATORY DATA:  White blood cell 10.5 with 9.7 neutrophils, hemoglobin  13.7, platelets 309.  Sodium 137, potassium 2.8, BUN 21, creatinine  1.25, glucose 135.  Urinalysis was positive for nitrites and large  amount of leukocyte esterase with too numerous to count white blood  cells.   IMPRESSION:  A 76 year old white man with urinary tract infection.   PLAN:  1. Blood and urine cultures.  2. Test urine PCR for gonorrhea and chlamydia.  3. Ciprofloxacin 400 mg IV b.i.d.  4. Intravenous fluids.  5. Phenergan p.r.n.  6. Replace potassium.  7. This is the second UTI in a little over a year, he probably needs a      urologic evaluation for chronic prostatitis or other urologic      abnormality.  8. Continue his diabetes and other cardiac medicines as well as the      Flomax for BPH.      Christell Faith, MD  Electronically Signed     NDL/MEDQ  D:  06/02/2006  T:  06/02/2006  Job:  [id number]

## 2010-06-28 NOTE — Op Note (Signed)
[name], [name]               ACCOUNT NO.:  [id number]   MEDICAL RECORD NO.:  [id number]          PATIENT TYPE:  AMB   LOCATION:  DAY                          FACILITY:  [facility]   PHYSICIAN:  Boston Service, M.D.DATE OF BIRTH:  1934-12-13   DATE OF PROCEDURE:  06/04/2006  DATE OF DISCHARGE:                               OPERATIVE REPORT   INDICATION:  A 76 year old male admitted June 02, 2006, with fever and  dysuria.  Initial consideration given to febrile urinary tract  infection.  A CT was obtained on June 03, 2006, and showed a 9 mm  calculus within the proximal right ureter.  The patient transferred from  [facility] to [place] to proceed with cystoscopy, stent placement, and  attempted laser fragmentation.   POSTOPERATIVE DIAGNOSIS:  A 76 year old male admitted June 02, 2006,  with fever and dysuria.  Initial consideration given to febrile urinary  tract infection.  A CT was obtained on June 03, 2006, and showed a 9 mm  calculus within the proximal right ureter.  The patient transferred from  [facility] to [place] to proceed with cystoscopy, stent placement, and  attempted laser fragmentation.   PROCEDURE:  1. Cystoscopy.  2. Retrograde.  3. Ureteroscopy.  4. Holmium laser fragmentation of calculus.   ANESTHESIA:  General.   DRAINS:  A 6 French 26 cm double-J stent, 22 French Foley.   SPECIMENS:  Stony fragments.   COMPLICATIONS:  None obvious.   DESCRIPTION OF PROCEDURE:  The patient was prepped and draped in the  dorsal lithotomy position after institution of an adequate level of  general anesthesia.  Well-lubricated 21 French panendoscope was gently  inserted at the urethral meatus.  Normal urethra and sphincter.  The  patient had elevated bladder neck due to prostate enlargement.  Orifices  were quite close to the prostatic urethra.  There was clear efflux at  the left ureteral orifice, minimal efflux at the right ureteral orifice.  Bladder was trabeculated but no  obvious intravesical pathology.  Retrograde films were obtained on the right, showed a somewhat tortuous  ureter above the vessels with a tightly impacted 9 mm calculus within  the proximal right ureter at about the L3 to L4 level.  Glidewire was  then advanced through the end-hole catheter, negotiated beyond the  stone, and coiled nicely into what appeared to be dilated upper pole  calices on retrograde.  End-hole catheter was advanced; Glidewire was  removed and replaced with a guidewire.  End-hole catheter was then  withdrawn, and the ureteroscope was inserted alongside the guidewire.  Stone appeared impacted at a level just below the UPJ.  A 365 holmium  fiber was selected and fragmentation commenced over a period of about 20-  30 minutes.  Fine fragments were produced, no more than about 1-2 mm.  Once all sizeable fragments had been treated, ureteroscope was gently  withdrawn.  There was no obvious evidence of injury to the  ureter.  A 6 French 26 cm double-J stent was selected, advanced over the  indwelling guidewire with excellent pigtail formation on guidewire  removal.  The bladder  was filled to capacity; cystoscope was withdrawn.  A 22 French Foley was inserted and left to straight drain.  The patient  was then returned to recovery in satisfactory condition.           ______________________________  Boston Service, M.D.     RH/MEDQ  D:  06/04/2006  T:  06/04/2006  Job:  [id number]   cc:   [name]. [name], MD  [address]  [place]  [place] [address]
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
## 2010-06-28 NOTE — Consult Note (Signed)
NAME:  [name], [name]                         ACCOUNT NO.:  [id number]   MEDICAL RECORD NO.:  [id number]                   PATIENT TYPE:  INP   LOCATION:  3731                                 FACILITY:  [facility]   PHYSICIAN:  [name], M.D.             DATE OF BIRTH:  07-Sep-1934   DATE OF CONSULTATION:  07/25/2002  DATE OF DISCHARGE:                                   CONSULTATION   <REFERRING PHYSICIAN/>  Scott M. [name], M.D. [facility]   CHIEF COMPLAINT:  Chest pain.   HISTORY OF PRESENT ILLNESS:  The patient is a 76 year old who is well known  to Dr. [name]. The patient has previous  hospitalization at [facility] in 2001, at which time he had chest pain and a negative CT scan at  that time and a Cardiolite which was nonischemic. Since Thursday of this  last week he has been having some chest pain. The pain sometimes occurs  intermittently. It can be sharp and runs all the way down into the scrotum.  The patient also had a torn rotator cuff and also has severe arthritic pain  his shoulders. This was demonstrated also in 2001.   He had some nausea all night but no vomiting but did have diarrhea. The  patient thought he had indigestion. He took some Gatorade and orange juice.  His blood pressure has been a little bit high recently. He has also had some  dizziness. The nitroglycerin and morphine  both help some with the pain. An  EKG done in Dr. Jodelle Green office did not show any acute changes. He says he  feels like  the room is spinning a little bit. The pain was slightly worse  with taking a breath in.   ALLERGIES:  ALLERGIES INCLUDE CODEINE.  He needs Phenergan with morph  sulfate.   MEDICATIONS:  Prinzide, Norvasc, aspirin, Vioxx, Ranitidine.   MEDICAL PROBLEMS:  1. High blood pressure for many years.  2. No diabetes.  3. He has had arthritis.  4. He had a cholecystectomy  in the 1980s.  5. He  had a colectomy in 1982 for diverticulitis and polyps.  6. He has  also had an electrocution.  7. He has had knee surgery.  8. He has had a replacement of an ear bone.   REVIEW OF SYSTEMS:  Reveals no rash. His ENT has been negative. No urinary  problems, bloody stools or swelling in the testicles. He has had diarrhea.  The review of systems is otherwise  negative.   SOCIAL HISTORY:  The patient went camping the last few days.  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 He  is married with 2  children. He is a nonsmoker.   FAMILY HISTORY:  His mother died at 72 of an MI. His father died at 75 of  stomach cancer. One brother with cancer. Another brother  with hypertension.  One sister with hypertension.   PHYSICAL EXAMINATION:  GENERAL:  He is an alert and oriented male in no  distress.  VITAL SIGNS:  Blood pressure 140/70, pulse 70, respirations 18, temperature  97.8.  SKIN:  Warm and dry.  LUNGS:  Clear.  HEART:  Cardiac rhythm was regular without significant murmurs or rubs.  There was an S4 gallop.  ABDOMEN:  No masses or hepatosplenomegaly.  EXTREMITIES:  Pulses were palpated throughout and found to be equal  bilaterally. Blood pressures were equal in the right and left arms.  NEUROLOGIC:  Cranial nerves intact and otherwise unremarkable.  CHEST:  Tender on the left side and reproduced some of the patient's pain.  GU:  The scrotum was normal.   LABORATORY DATA:  An EKG revealed normal sinus rhythm within normal limits.  There are small inferior  Qs that are nondiagnostic. One EKG done in the  emergency room reveals delayed R-wave progression with a leftward oriented  axis.   CK 163. Potassium 3.4, BUN and creatinine are normal. Hemoglobin 16.6,  hematocrit 47.1, white count normal.   IMPRESSION:  Chest pain. The etiology is uncertain. Findings suggest  musculoskeletal pain with equal pulses bilaterally, but we need to rule out  aortic dissection given the pain into the scrotum and we also need to rule  out ischemia or  myocardial infarction. I doubt this is a pulmonary embolus,  but this also needs to be ruled out.   PLAN:  1. Cardiac enzymes serially.  2. EKGs serially.  3. Spiral CT scan of the chest to rule out aortic dissection.  4. Discussed further workups with regard to cardiac findings.                                               [name], M.D.    TDS/MEDQ  D:  07/25/2002  T:  07/25/2002  Job:  [id number]

## 2010-06-28 NOTE — Op Note (Signed)
[name], [name]               ACCOUNT NO.:  [id number]   MEDICAL RECORD NO.:  [id number]          PATIENT TYPE:  AMB   LOCATION:  DSC                          FACILITY:  [facility]   PHYSICIAN:  Harvie Junior, M.D.   DATE OF BIRTH:  28-Feb-1934   DATE OF PROCEDURE:  05/07/2005  DATE OF DISCHARGE:                                 OPERATIVE REPORT   PREOPERATIVE DIAGNOSIS:  Impingement acromioclavicular joint arthritis and  questionable rotator cuff tear.   POSTOPERATIVE DIAGNOSIS:  Impingement acromioclavicular joint arthritis and  questionable rotator cuff tear.   OPERATION PERFORMED:  1.  Left subacromial decompression.  2.  Distal clavicle resection.  3.  Mini open rotator cuff repair of the subscapularis tendon.   SURGEON:  Harvie Junior, M.D.   ASSISTANT:  Marshia Ly, P.A.   ANESTHESIA:  General.   INDICATIONS FOR PROCEDURE:  [name] is a 76 year old male with a long  history of having had significant left shoulder pain after a fall.  He had  been treated conservatively for a period of time.  Injection therapy seemed  to have helped.  Because of continued complaints of pain, he was ultimately  taken to the operating room for subacromial decompression and distal  clavicle resection.   DESCRIPTION OF PROCEDURE:  The patient was taken to the operating room and  after adequate anesthesia was obtained with general anesthetic, patient  placed supine on the operating table.  The left shoulder was then prepped  and draped in the usual sterile fashion.  Following this, routine  arthroscopic examination of the shoulder revealed that the subscapularis  tendon had some partial fray at its insertion, otherwise unremarkable.  Biceps tendon was within normal limits.  The subscapularis muscle on the  front showed to have a tear.  It was difficult to identify but certainly, it  could have been a longitudinal tear in the subscap tendon anteriorly.  We  debrided the insertional  portions of the cuff tear from within the  glenohumeral joint and went out of the glenohumeral joint, went up into the  subacromial space through an anterolateral acromioplasty did a distal  clavicle resection and following this, a small incision was made over the  lateral aspect of the shoulder.  The subcutaneous tissue dissected down to  the level of the rent in the deltoid.  The subscapularis was then identified  and noted to have good insertional strength, but there was a longitudinal  tear in the subscapularis and this was repaired with two #2 Ethibond  interrupted sutures.  This gave excellent repair of the subscap tendon and  once this was accomplished, the arm was put through a range of motion.  There was no tendency towards problems.  The acromioplasty was evaluated  from this open procedure and was felt to be perfectly adequate.  The distal  clavicle was palpated from the open procedure and was felt to be done well.  At this point the shoulder was copiously irrigated and suctioned dry.  The  deltoid rent was closed with 1 Vicryl running  suture, skin with 0  and 2-0  Vicryl and 3-0 Maxon pullout sutures.  The  arthroscopic portals were closed with a bandage.  Sterile compressive  dressing was applied.  The patient was then transferred to the recovery room  where he was noted to be in satisfactory condition.  The estimated blood  loss for this procedure was none.      Harvie Junior, M.D.  Electronically Signed     JLG/MEDQ  D:  05/07/2005  T:  05/08/2005  Job:  [id number]
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
## 2010-06-28 NOTE — Cardiovascular Report (Signed)
NAME:  [name], [name]                         ACCOUNT NO.:  [id number]   MEDICAL RECORD NO.:  [id number]                   PATIENT TYPE:  INP   LOCATION:  3740                                 FACILITY:  [facility]   PHYSICIAN:  Arturo Morton. Riley Kill, M.D.             DATE OF BIRTH:  07-14-1934   DATE OF PROCEDURE:  07/27/2002  DATE OF DISCHARGE:                              CARDIAC CATHETERIZATION   INDICATIONS:  The patient is a pleasant 76 year old gentleman who presents  with ongoing chest pain.  His enzymes were negative and his EKG showed no  acute changes.  Prior echocardiogram in 2000 revealed an abnormal septal  motion.  Importantly, the patient had a catheterization in 1983 that did not  show significant disease, although the report of this is unavailable.  His  chest pain has had some atypical features and enzymes have been negative x3.  The current study is done to reassess coronary anatomy in light of the chest  pain and it was favored by [name]. [name], M.D. [facility] for further evaluation.   PROCEDURE:  1. Left heart catheterization.  2. Selective coronary arteriography.  3. Selective left ventriculography.   DESCRIPTION OF PROCEDURE:  The procedure was performed from the femoral  artery using 6-French catheters.  He tolerated the procedure well and there  were no complications.  He was taken to the holding area in satisfactory  clinical condition following the completion of procedure.  He had some mild  nausea prior to the procedure and was given 2 mg of Zofran.  He was also  given Versed for relaxation.   HEMODYNAMIC DATA:  1. Central aorta 151/80, mean 109.  2. Left ventricle 149/3/15.  3. No gradient on pullback across the aortic valve.   ANGIOGRAPHIC DATA:  1. Ventriculography was performed in the RAO projection.  Overall ejection     fraction would be estimated at 50-55%.  There is some abnormal motion of     the distal anterolateral wall, although it is not clearly  akinetic.  It     appears somewhat dyssynergic.  2. The left main coronary artery is free of critical disease.  3. The left anterior descending courses to the apex.  There are three major     diagonal branches.  The LAD after the third diagonal branch has some mild     systolic milking suggestive of mild myocardial __________.  The remainder     of the LAD, however, is without critical disease.  The first large     diagonal branch is free of disease.  The second fairly large diagonal     branch is also free of disease.  The third diagonal branch which is     moderate size has a segmental plaque just after its takeoff with about 50-     60%.  4. The circumflex artery provides two minor marginal branches proximally  which are small and insignificant.  The third marginal branch is a fairly     large vessel that has probably 40 to at most 50% plaquing right at the     ostium.  Distally, the AV circumflex provides several other small minor     branches and terminates as a small posterior descending branch with about     40-50% narrowing in its proximal portion.  5. The right coronary artery is a very large caliber vessel providing an     acute marginal branch, a posterior descending branch, and a large     posterolateral branch.  There is perhaps 30% to no more than 40% plaquing     at the takeoff of the PDA.  The remainder of the right coronary artery is     free of significant disease with an estimated size of between 5-6 mm in     its main portion.   CONCLUSIONS:  1. Preserved overall left ventricular function with a minor wall motion     abnormality (abnormal septal motion noted by echocardiography in 2000).  2. 50-60% lesion of the third diagonal branch is noted.  3. Mild ostial disease involving the circumflex marginal.  4. Mild segmental plaquing at the origin of the posterior descending in the     AV portion of the right coronary artery.   DISPOSITION:  It is difficult to say  that the current coronary anatomy  explains any of his chest pain.  His enzymes have been negative.  He has not  had dynamic EKG changes.  The current anatomy does suggest mild to moderate  plaquing, but no high grade lesions.  Aggressive medical therapy would be  warranted on this patient.  Continue follow up with [name]. [name], M.D. [facility]  in addressing other issues would also be important.                                               Arturo Morton. Riley Kill, M.D.    TDS/MEDQ  D:  07/27/2002  T:  07/28/2002  Job:  [id number]  CV Lab   cc:   CV Lab

## 2010-06-28 NOTE — Discharge Summary (Signed)
NAMENEILAN, [name]               ACCOUNT NO.:  [id number]   MEDICAL RECORD NO.:  [id number]          PATIENT TYPE:  INP   LOCATION:  5740                         FACILITY:  [facility]   PHYSICIAN:  [name]. [name], MD     DATE OF BIRTH:  [birth year]   DATE OF ADMISSION:  06/01/2006  DATE OF DISCHARGE:  06/04/2006                               DISCHARGE SUMMARY   FINAL DIAGNOSES:  1. Admitted June 02, 2006 via the emergency room with fever and      dysuria.  The patient diagnosed with acute pyelonephritis and it      grew an Enterobacter species from his urine that was sensitive to      all antibiotics except Keflex.  The patient was treated with Cipro      with resolution of infection.  2. Abnormal CT abdomen with moderate right hydronephrosis and 9 mm      proximal right ureteral calculus.  Urology consultation, Dr.      [facility] with cystoscopy, double-J stent placement, and      attempted laser fragmentation.  Follow-up by Dr. Wanda Plump in his      office.  3. History of hypertension - controlled on medications.  4. Type 2 diabetes, controlled on metformin.  5. Hypercholesterolemia treated with diet alone.  6. History of gastritis.  7. History of diverticulosis and colon polyps.  8. History of benign prostatic hypertrophy with previous evaluation by      Dr. Earlene Plater.  9. History of degenerative arthritis involving C-spine, bilateral      knees, and left shoulder.  10.  History of anxiety.  10.Status post cholecystectomy, appendectomy and resection of Meckel's      diverticulum.  11.History of right Bell's palsy.   BRIEF HISTORY AND PHYSICAL:  The patient is a 76 year old gentleman who  presented to emergency room on June 02, 2006 with onset of fever and  chills with severe rigors.  He noticed some yellow discoloration in his  underwear with some burning on urination.  He denied hematuria, flank  pain, nausea, vomiting, testicular pain, or GI symptoms.  He was  evaluated  in the emergency room and found to have a urinary tract  infection and was referred for admission.   PAST MEDICAL HISTORY:  As noted he has a history of hypertension  controlled on Norvasc, lisinopril, hydrochlorothiazide, and Toprol.  He  has known nonobstructive coronary disease on catheterization at 2004.  He has a history of diabetes mellitus controlled on metformin.  He had a  blood sugar of 127, hemoglobin A1c is 6.5 in December 2007.  He has a  history of hypercholesterolemia that he chooses to treat with diet  alone.  He had been on Vytorin in the past but discontinued this due to  elevated liver enzymes.  He has a history of gastritis with endoscopy  many years ago.  He has a history of diverticulosis and colon polyps.  His last colonoscopy was in 2005 by Dr. Marina Goodell.  There is a questionable  history of a sigmoid colectomy for a perforated diverticulum in the  past.  He has a previous history of kidney stones with lithotripsy in  2002 under the care of Dr. Earlene Plater.  He has known BPH and takes Flomax.  He has severe degenerative arthritis with known C-spine spondylosis,  bilateral knee surgery by Dr. Turner Daniels and left shoulder injections by Dr.  Luiz Blare.  He has a history of anxiety.  He has had previous  cholecystectomy, appendectomy, and abdominal surgery involving resection  of a Meckel's diverticulum and a GE junction leiomyoma in 1986 during  exploratory laparotomy by  Dr. Jamey Ripa.  He has a previous Bell's palsy in the 1980s and was treated  by Dr. Sandria Manly at that time.   PHYSICAL EXAMINATION:  Examination at the time of admission revealed elderly gentleman in no  acute distress.  Blood pressure 144/66, pulse 122 and regular,  respirations 18 per minute and not labored, O2 sat 99% on room air,  temperature 102 degrees orally.  HEENT:  Exam was unremarkable.  NECK:  Supple.  No jugular venous distension, no carotid bruits.  LUNGS:  Clear to percussion and auscultation.  CARDIAC:   Cardiac exam revealed resting tachycardia, grade 1/6 systolic  ejection murmur at the left sternal border.  No rubs or gallops heard.  ABDOMEN:  The abdomen was soft, nontender, without organomegaly or  masses.  There was no flank tenderness on percussion.  EXTREMITIES:  No cyanosis, clubbing or edema.  NEUROLOGIC:  Intact without focal abnormalities detected.   LABORATORY DATA:  EKG showed sinus tachycardia, Q-waves in lead II, and  nonspecific ST-T wave changes.  Chest x-ray revealed minimal right lower  lobe atelectasis, otherwise clear.  CT scan of the abdomen and pelvis  showed moderate right hydronephrosis with a 9 mm proximal right ureteral  calculus; small bilateral renal calculi were noted; and in the pelvis no  bladder calculi were noted, moderately enlarged prostate seen.  Hemoglobin 13.7, hematocrit 39.8, white count 10,500 with 93% segs.  Sodium 137, potassium 2.8, repeat 3.5, chloride 106, CO2 22, BUN 21,  creatinine 1.2, blood sugar 135, calcium 9.1.  Total protein 6.3,  albumin 3.7, AST 27, ALT 22, alk phos 54, total bilirubin 1, phosphorus  2.9.  Hemoglobin A1c 6.5.  Cholesterol 117, triglycerides 68, HDL 33,  LDL 70.  TSH 5.2.  Urinalysis showed large blood, ketones, protein  positive leukocyte esterase, white cells and bacteria.  Blood cultures  were negative x2.  Urine culture was positive for Enterobacter species  resistant to Keflex, sensitive to all other antibiotics.   HOSPITAL COURSE:  The patient was admitted to my service and placed on  IV Cipro.  CT scan was obtained and urologic consultation from Dr.  Wanda Plump was requested.  Dr. Wanda Plump felt he needed cysto and stent  placement with attempted laser fragmentation of the stone.  This was  performed on June 04, 2006.  Dr. Wanda Plump felt he could be discharged  post cystoscopy and followed up with him in the office.   DISCHARGE MEDICATIONS:  1. Cipro 500 mg p.o. b.i.d.. 2. Metformin 500 mg p.o. b.i.d.   3. Norvasc 5 mg p.o. daily.  4. Metoprolol XL 50 mg p.o. daily.  5. Flomax 0.4 mg p.o. daily.  6. Tylenol two tablets every four hours as needed for pain.   DISPOSITION:  The patient will follow up with Dr. Wanda Plump in the  office next week and will see me in one month for medical follow-up.   CONDITION ON DISCHARGE:  Improved.  [name]. [name], MD  Electronically Signed     SMN/MEDQ  D:  08/06/2006  T:  08/06/2006  Job:  [id number]   cc:   [name]. [name], MD  [facility], M.D.
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
## 2010-08-12 ENCOUNTER — Telehealth: Payer: Self-pay | Admitting: Pulmonary Disease

## 2010-08-12 NOTE — Telephone Encounter (Signed)
Spoke with pt. He is c/o "small knot" on his neck " on the inside" He states this is not painful. Had a few episodes last wk where he felt dizzy. Denies any dysphagia or sore throat. OV with TP for 08/15/10 at 3:45. Advised seek emergency care sooner if needed and pt verbalized understanding,.

## 2010-08-12 NOTE — Telephone Encounter (Signed)
[email or web address]  

## 2010-08-12 NOTE — Telephone Encounter (Signed)
PATIENT RETURNED CALL PLEASE CALL BACK

## 2010-08-15 ENCOUNTER — Other Ambulatory Visit (INDEPENDENT_AMBULATORY_CARE_PROVIDER_SITE_OTHER): Payer: Medicare Other

## 2010-08-15 ENCOUNTER — Encounter: Payer: Self-pay | Admitting: *Deleted

## 2010-08-15 ENCOUNTER — Ambulatory Visit (INDEPENDENT_AMBULATORY_CARE_PROVIDER_SITE_OTHER): Payer: Medicare Other | Admitting: Adult Health

## 2010-08-15 VITALS — BP 136/74 | HR 65 | Temp 97.7°F | Ht 72.0 in | Wt 235.4 lb

## 2010-08-15 DIAGNOSIS — R220 Localized swelling, mass and lump, head: Secondary | ICD-10-CM

## 2010-08-15 DIAGNOSIS — R221 Localized swelling, mass and lump, neck: Secondary | ICD-10-CM

## 2010-08-15 DIAGNOSIS — E785 Hyperlipidemia, unspecified: Secondary | ICD-10-CM

## 2010-08-15 LAB — BASIC METABOLIC PANEL
BUN: 23 mg/dL (ref 6–23)
CO2: 30 mEq/L (ref 19–32)
Chloride: 99 mEq/L (ref 96–112)
GFR: 64.2 mL/min (ref >60.00)
Glucose, Bld: 142 mg/dL — ABNORMAL HIGH (ref 70–99)
Potassium: 3.9 mEq/L (ref 3.5–5.1)

## 2010-08-15 LAB — CBC WITH DIFFERENTIAL/PLATELET
Basophils Absolute: 0 10*3/uL (ref 0.0–0.1)
Eosinophils Absolute: 0.2 10*3/uL (ref 0.0–0.7)
HCT: 47.1 % (ref 39.0–52.0)
Lymphs Abs: 2.2 10*3/uL (ref 0.7–4.0)
MCHC: 34.5 g/dL (ref 30.0–36.0)
MCV: 96.9 fl (ref 78.0–100.0)
Monocytes Absolute: 0.8 10*3/uL (ref 0.1–1.0)
Platelets: 308 10*3/uL (ref 150.0–400.0)
RDW: 13.9 % (ref 11.5–14.6)

## 2010-08-15 LAB — TSH: TSH: 2.62 u[IU]/mL (ref 0.35–5.50)

## 2010-08-15 NOTE — Progress Notes (Signed)
Subjective:    Patient ID: [name], male    DOB: April 25, 1934, 76 y.o.   MRN: [id number]  HPI 76 y/o WM with known hx of HBP, CAD, Hyperlipidemia, DM, DJD, f also sees DrWall for Cards, DrPerry for GI (Gastritis, Divertics, Polyps), & DrBorden for Urology (Hx Kidney stones & BPH)...   ~ August 14, 2009: he's had a good 17mo- he saw DrGraves for Ortho 3/11 w/ "endstage" DJD in knees & told he needs TKR, he's lost 19# on diet & states he's doing just fine... also saw DrPerry 4/11 for colonoscopy +divertics, fair prep but no polyps seen (Dr wants f/u 56yrs w/ better prep)... BP controlled; denies angina, etc; Lipids doing well on diet with his weight loss; DM control similar- keep up the good work...   ~ March 15, 2010:  He was hosp 9/11 by [facility] w/ atypCP & seen by Walker Kehr- no further testing felt nec; he gave them a very old med list he had in his wallet- so Metoprolol was not given or included in his disch med rec; BP has been running high 150-160+ & this is the 1st medical f/u since then> rec to re-start the METOPROLOL 50mg Bid.  He saw Urology, DrBorden 11/11 for f/u BPH, hx kid stones, ED & prostate cancer screening> he passed an 8mm distal right ureteral stone & has bilat nonobstructing stones in kidneys, stable on Flomax & prostaglandin injections Prn for ED.  Currently feeling well- only c/o knee arthritis as before L worse than R & holding off on TKRs "I want to keep them a while longer"; states chest OK w/o CP, palp, SOB, edema; controls Lipids w/ diet but weight up 10# & not checking BS at home; wants 90d refills of all meds.   08/15/10 Acute OV  Pt presents for an acute office visit. Complains of small "lump" in left side of neck x1week - denies any soreness, difficulty swallowing.  A friend noticed lump when she hugged him from behind. Has been mowing grass a lot lately with some allergies. NO sore throat, dysphagia, ear pain, heat /cold intolerances, chest pain or cough/fever. Feels a  small soft nodule along -nontender- along left mid throat .   NO weight loss.   Review of Systems Constitutional:   No  weight loss, night sweats,  Fevers, chills, fatigue, or  lassitude.  HEENT:   No headaches,  Difficulty swallowing,  Tooth/dental problems, or  Sore throat,                No sneezing, itching, ear ache, nasal congestion, + post nasal drip,   CV:  No chest pain,  Orthopnea, PND, swelling in lower extremities, anasarca, dizziness, palpitations, syncope.   GI  No heartburn, indigestion, abdominal pain, nausea, vomiting, diarrhea, change in bowel habits, loss of appetite, bloody stools.   Resp: No shortness of breath with exertion or at rest.  No excess mucus, no productive cough,  No non-productive cough,  No coughing up of blood.  No change in color of mucus.  No wheezing.  No chest wall deformity  Skin: no rash or lesions.  GU: no dysuria, change in color of urine, no urgency or frequency.  No flank pain, no hematuria   MS:  No joint pain or swelling.  No decreased range of motion.     Psych:  No change in mood or affect. No depression or anxiety.  No memory loss.         Objective:  Physical Exam GEN: A/Ox3; pleasant , NAD, well nourished   HEENT:   [place]/AT,  EACs-clear, TMs-wnl, NOSE-clear, THROAT-clear, no lesions, no postnasal drip or exudate noted.   NECK:  Supple w/ fair ROM; no JVD; normal carotid impulses w/o bruits; no thyromegaly , small soft nodule /?enlarged thyroid along left border , palpable with swallowing. No supraclavicular adenopathy noted.   RESP  Clear  P & A; w/o, wheezes/ rales/ or rhonchi.no accessory muscle use, no dullness to percussion  CARD:  RRR, no m/r/g  , no peripheral edema, pulses intact, no cyanosis or clubbing.  GI:   Soft & nt; nml bowel sounds; no organomegaly or masses detected.  Musco: Warm bil, no deformities or joint swelling noted.   Neuro: alert, no focal deficits noted.    Skin: Warm, no lesions or  rashes         Assessment & Plan:

## 2010-08-15 NOTE — Patient Instructions (Signed)
We are setting you up an Ultrasound of your thyroid  I will call with labs.  Follow up Dr. [name]  In 4 weeks as planned.  Please contact office for sooner follow up if symptoms do not improve or worsen or seek emergency care

## 2010-08-16 ENCOUNTER — Ambulatory Visit
Admission: RE | Admit: 2010-08-16 | Discharge: 2010-08-16 | Disposition: A | Discharge status: Home / Self Care | Payer: Medicare Other | Source: Ambulatory Visit | Attending: Adult Health | Admitting: Adult Health

## 2010-08-16 DIAGNOSIS — R221 Localized swelling, mass and lump, neck: Secondary | ICD-10-CM

## 2010-08-19 ENCOUNTER — Encounter: Payer: Self-pay | Admitting: Adult Health

## 2010-08-20 ENCOUNTER — Telehealth: Payer: Self-pay | Admitting: Pulmonary Disease

## 2010-08-20 NOTE — Telephone Encounter (Signed)
Labs were okay -thyroid panel was nml  [place] of Thyroid showed very small cysts , will cont to follow  No enlarged lymph nodes.  follow up Dr. [name] As plannned and As needed  Please contact office for sooner follow up if symptoms do not improve or worsen or seek emergency care    I informed pt of TP's findings and recommendations. Pt verbalized understanding

## 2010-08-20 NOTE — Progress Notes (Signed)
Quick Note:  I informed pt of TP's findings and recommendations. Pt verbalized understanding    ______

## 2010-08-21 ENCOUNTER — Encounter: Payer: Self-pay | Admitting: Cardiology

## 2010-08-22 NOTE — Assessment & Plan Note (Addendum)
Left soft throat nodule ?thyroid enlargement vs reactive adenopathy.   Plan:  Set up for [place] of thyroid - if neg will recheck on return to make sure adenopathy has resolved.  Thyroid panel pending.

## 2010-09-11 ENCOUNTER — Encounter: Payer: Self-pay | Admitting: Pulmonary Disease

## 2010-09-16 ENCOUNTER — Ambulatory Visit (INDEPENDENT_AMBULATORY_CARE_PROVIDER_SITE_OTHER): Payer: Medicare Other | Admitting: Pulmonary Disease

## 2010-09-16 ENCOUNTER — Encounter: Payer: Self-pay | Admitting: Pulmonary Disease

## 2010-09-16 ENCOUNTER — Other Ambulatory Visit (INDEPENDENT_AMBULATORY_CARE_PROVIDER_SITE_OTHER): Payer: Medicare Other

## 2010-09-16 DIAGNOSIS — E119 Type 2 diabetes mellitus without complications: Secondary | ICD-10-CM

## 2010-09-16 DIAGNOSIS — M1990 Unspecified osteoarthritis, unspecified site: Secondary | ICD-10-CM

## 2010-09-16 DIAGNOSIS — N401 Enlarged prostate with lower urinary tract symptoms: Secondary | ICD-10-CM

## 2010-09-16 DIAGNOSIS — K2990 Gastroduodenitis, unspecified, without bleeding: Secondary | ICD-10-CM

## 2010-09-16 DIAGNOSIS — E785 Hyperlipidemia, unspecified: Secondary | ICD-10-CM

## 2010-09-16 DIAGNOSIS — D126 Benign neoplasm of colon, unspecified: Secondary | ICD-10-CM

## 2010-09-16 DIAGNOSIS — F411 Generalized anxiety disorder: Secondary | ICD-10-CM

## 2010-09-16 DIAGNOSIS — I2510 Atherosclerotic heart disease of native coronary artery without angina pectoris: Secondary | ICD-10-CM

## 2010-09-16 DIAGNOSIS — K5730 Diverticulosis of large intestine without perforation or abscess without bleeding: Secondary | ICD-10-CM

## 2010-09-16 DIAGNOSIS — I10 Essential (primary) hypertension: Secondary | ICD-10-CM

## 2010-09-16 LAB — HEMOGLOBIN A1C: Hgb A1c MFr Bld: 8.4 % — ABNORMAL HIGH (ref 4.6–6.5)

## 2010-09-16 NOTE — Patient Instructions (Signed)
Today we updated your med list in EPIC...  Continue your current meds the same...  The small cyst on your neck appears unchanged & we will continue to follow this...  Today we did your follow up A1c diabetic blood test...    Please call the [facility]REE in a few days for your results...    Dial [id number] & when prompted enter your patient number followed by the # symbol...    Your patient number is:  [id number]  Keep up the good work w/ diet 7 exercise!!!  Call for any questions...  Let's plan a follow up visit in 6 months, sooner if needed for problems.Marland KitchenMarland Kitchen

## 2010-09-16 NOTE — Progress Notes (Signed)
Subjective:    Patient ID: [name], male    DOB: 04-Apr-1934, 76 y.o.   MRN: [id number]  HPI 76 y/o WM here for a follow up visit... he has multiple medical problems as noted below... Followed for general medical purposes w/ HBP, CAD, Hyperlipidemia, DM, DJD, etc... also sees DrWall for Cards, DrPerry for GI (Gastritis, Divertics, Polyps), & DrBorden for Urology (Hx Kidney stones & BPH)...   ~  August 14, 2009:  he's had a good 52mo- he saw DrGraves for Ortho 3/11 w/ "endstage" DJD in knees & told he needs TKR, he's lost 19# on diet & states he's doing just fine... also saw DrPerry 4/11 for colonoscopy +divertics, fair prep but no polyps seen (Dr wants f/u 65yrs w/ better prep)... BP controlled;  denies angina, etc;  Lipids doing well on diet with his weight loss;  DM control similar- keep up the good work...  ~  March 15, 2010:      He was hosp 9/11 by [facility] w/ atypCP & seen by Walker Kehr- no further testing felt nec;  he gave them a very old med list he had in his wallet- so Metoprolol was not given or included in his disch med rec;  BP has been running high 150-160+ & this is the 1st medical f/u since then> rec to re-start the METOPROLOL 50mg Bid.    He saw Urology, DrBorden 11/11 for f/u BPH, hx kid stones, ED & prostate cancer screening> he passed an 8mm distal right ureteral stone & has bilat nonobstructing stones in kidneys, stable on Flomax & prostaglandin injections Prn for ED.    Currently feeling well- only c/o knee arthritis as before L worse than R & holding off on TKRs "I want to keep them a while longer";  states chest OK w/o CP, palp, SOB, edema;  controls Lipids w/ diet but weight up 10# & not checking BS at home;  wants 90d refills of all meds.  ~  September 16, 2010:  52mo ROV >> generally feeling well, hx severe DJD in kness as noted, "I get plenty of exercise", had shot in knee recently by DrRowan, takes Aleve prn...    He saw DrBorden 3/12 for f/u stones, BPH w/ LUTS, ED & PSA  screening> doing well on Flomax, PSA= 2.0, he wanted to do 24H urine but pt declined.    Noted small lump in ant neck 7/12 & saw TP; Thyroid Sonar showed 3 sm (<66mm) thyr cysts- the palp lesion is in left SCM muscle w/ benign features; we will follow clinically- no changes noted.    LABS 7/12 reviewed: BS=142 (A1c today= 8.4); other Chems/ CBC/ TFTs> all wnl; REC- add GLIMEPIRIDE 1mg /d w/ A1c continuing to rise...           Problem List:  HEARING LOSS - he also sees DrShoemaker w/ hearing eval 7/09 & new hearing aides...  HYPERTENSION (ICD-401.9) - controlled on METOPROLOL 50mg Bid, LISINOPRIL/HCT 20-12.5 Bid, KCl 20/d & NORVASC 10mg /d...  BP is 132/84 here today> he knows to follow a low salt diet and needs to lose wt... he is asymtomatic- denies HA, fatigue, visual changes, CP, palipit, dizziness, syncope, dyspnea, edema, etc... ~  3/10:  Metoprolol 50mg  Bid added to his regimen. ~  2/12:  he was using an OLD MED LIST (which we discarded) & we reviewed his 4 med regimen & asked him to monitor BP at home.  CAD (ICD-414.00) - Hx non-obstructive disease on cath 6/04 w/ 50%3rdDiag  branch of LAD, 40%CIRC, 30%distalRCA... he takes ASA 81mg  daily and practicing secondary risk factor reduction strategy...  currently w/o CP, palpit, change in breathing, etc... last seen by DrWall 4/10> stable, no changes made. ~  3/10:  Myoview showed mild global HK, EF= 47%, apic thinning vs sm infarct & ?mild ischemia. ~  7/11:  no CP, palpit, SOB, etc... he is active- walking, yard, etc... ~  9/11:  adm by [facility] w/ atypCP, seen by Walker Kehr & no further testing felt necessary.  HYPERLIPIDEMIA, BORDERLINE (ICD-272.4) - on diet alone... ~  FLP 12/07 showing TChol 157, TG 137, HDL 35, LDL 95 ~  FLP 1/09 showed TChol 170, TG 144, HDL 32, LDL 109 ~  FLP 7/09 (wt= 233#) showed TChol 158, TG 162, HDL 31, LDL 95 ~  FLP in hosp 3/10 showed TChol 154, TG 177, HDL 25, LDL 94... he doesn't want Fibrate Rx. ~  FLP 7/10 showed  TChol 160, TG 162, HDL 30, LDL 98... still refuses med Rx... ~  FLP 1/11 showed TChol 174, TG 170, HDL 36, LDL 104 ~  FLP 7/11 showed TChol 161, TG 192, HDL 34, LDL 88 ~  FLP 2/12 showed TChol 166, TG 273, HDL 31, LDL 103... worse TG- may need Fibrate if wt not coming down!  AODM (ICD-250.00) - on METFORMIN 500mg Qd + diet... discussed weight reduction strategies... ~  in 07-11-06 BS=127, & HgA1c=6.5 on Metformin 500Bid... pt decr to 1/d on his own. ~  labs 1/09 showed BS= 143, HgA1c= 6.8.Marland Kitchen. ~  labs 7/09 (wt= 233#) showed BS= 136, HgA1c= 6.9.Marland KitchenMarland Kitchen rec- OK on Metform500/d, needs better diet. ~  labs 2/10 (wt= 242#) showed BS= 171, HgA1c= 6.9.Marland Kitchen. rec> get wt down. ~  labs 7/10 (wt= 240#) showed BS= 135, A1c= 7.0.Marland KitchenMarland Kitchen may need additional med. ~  labs 1/11 (wt=245#) showed BS= 149, A1c= 7.1 ~  labs 7/11 (wt= 226#) showed BS= 146, A1c= 7.3 ~  labs 2/12 (wt=236#) showed BS= 151, A1c= 7.7.Marland Kitchen. rec> incr METFORM500Bid & get wt down. ~  Labs 8/12 (wt=232#) showed BS= 142, A1c= 8.4... Rec> add GLIMEPIRIDE 1mg /d.  Hx of GASTRITIS (ICD-535.50) - on PRILOSEC 20mg  OTC Prn... ~  EGD 2/09 by DrDBrodie showed 2cmHH, mod esophagitis, duodenitis... Rx'd ProtonixBid.  DIVERTICULOSIS OF COLON (ICD-562.10) COLONIC POLYPS (ICD-211.3) - last colonoscopy 9/05 by DrPerry showed divertics & diminutive polyp... f/u planned 5 years & due now- he will call to set this up...  HYPERTROPHY PROSTATE W/UR OBST & OTH LUTS (ICD-600.01) RENAL CALCULUS (ICD-592.0) - on FLOMAX 0.4mg /d per Urology & followed for BPH, Urolithiasis, ED & PSA screening purposes. ~  he has prev required cystoscopy w/ stents, & prev had lithotripsy as well...  ~  labs 7/10 showed PSA= 2.09, & subseq labs by DrRDavis (we don't have his notes). ~  11/11:  seen by DrBorden & passed an 8mm right ureteral stone, XRays show bilat nonobstructing stones in kidneys. ~  3/12:  Continued f/u w/ DrBorden & PSA= 2.0  Hx of PYELONEPHRITIS (ICD-590.80) - Adm 4/08 w/ pyelo from  obstructing R kidney stone requiring cysto, stent, laser fragmentation...  DEGENERATIVE JOINT DISEASE (ICD-715.90) - he uses OTC meds w/ relief... followed by Sherin Quarry w/ "endstage" DJD in knees (left worse than right) & told he needs TKRs- he is determined to "keep them a little bit longer"...  ANXIETY (ICD-300.00) - his wife passed away 2005-07-10 & he has been enjoying fishing, hunting, and taking care of his 41 y/o grandson.   Past Surgical  History  Procedure Date  . Appendectomy   . Cataract extraction   . Knee surgery     bilateral  . Shoulder surgery 04/2005    left by Dr. Luiz Blare  . 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 S/p cysto & stents for kidnedy stone 11/08 by dr. Wanda Plump     Outpatient Encounter Prescriptions as of 09/16/2010  Medication Sig Dispense Refill  . amLODipine (NORVASC) 10 MG tablet Take 1 tablet by mouth daily.      Marland Kitchen aspirin 325 MG tablet Take 325 mg by mouth daily.        Marland Kitchen lisinopril-hydrochlorothiazide (PRINZIDE,ZESTORETIC) 20-12.5 MG per tablet Take 1 tablet by mouth daily.      . metFORMIN (GLUCOPHAGE) 500 MG tablet Take 1 tablet by mouth Twice daily.      . metoprolol (LOPRESSOR) 50 MG tablet Take 1 tablet by mouth Twice daily.      . potassium chloride SA (K-DUR,KLOR-CON) 20 MEQ tablet Take 1 tablet by mouth daily.      . Tamsulosin HCl (FLOMAX) 0.4 MG CAPS Take 1 tablet by mouth daily.        Allergies  Allergen Reactions  . Codeine Phosphate     REACTION: nausea  . Morphine     REACTION: nausea    Current Medications, Allergies, Past Medical History, Past Surgical History, Family History, and Social History were reviewed in [employer] record.    Review of Systems    See HPI - all other systems neg except as noted...  The patient complains of decreased hearing, dyspnea on exertion, and difficulty walking.  The patient denies anorexia, fever, weight loss,  weight gain, vision loss, hoarseness, chest pain, syncope, peripheral edema, prolonged cough, headaches, hemoptysis, abdominal pain, melena, hematochezia, severe indigestion/heartburn, hematuria, incontinence, muscle weakness, suspicious skin lesions, transient blindness, depression, unusual weight change, abnormal bleeding, enlarged lymph nodes, and angioedema.     Objective:   Physical Exam    WD, Overweight, 76 y/o WM in NAD... GENERAL:  Alert & oriented; pleasant & cooperative... HEENT:  /AT, EOM-wnl, PERRLA, EACs-clear, TMs-wnl, NOSE-clear, THROAT-clear & wnl. NECK:  Supple w/ fairROM; no JVD; normal carotid impulses w/o bruits; no thyromegaly or nodules palpated; no lymphadenopathy. CHEST:  Clear to P & A; without wheezes/ rales/ or rhonchi. HEART:  Regular Rhythm; without murmurs/ rubs/ or gallops. ABDOMEN:  Soft & nontender; normal bowel sounds; no organomegaly or masses detected. EXT:  mod arthritic changes; no varicose veins/ +venous insuffic/ or edema. NEURO:  CN's intact;  no focal neuro deficits... DERM:  No lesions noted; no rash etc...   Assessment & Plan:   HBP>  BP controlled on 4 meds as listed, encouraged to take regularly esp in view of his DM, low salt diet too...  CAD>  Continue ASA & secondary risk factor reduction strategy; he has seen DrNishan & DrWall in the past...  HYPERLIPIDEMIA>  On diet alone but wt too hi & TG too hi; needs better low carb, low fat wt reducing diet efforts; he declined to check FLP today...  DM>  A1c is 8.4 on Metformin 500mg  Bid; rec to add GLIMEPIRIDE 1mg /d to regimen, and better diet...  GI> Gastritis, Divertics, Polyps>  He uses Prilosec prn, he is overdue for screening colon f/u by DrPerry- refer to GI...  GU> Kidney Stones, BPH w/ LTOS, ED>  Followed by DrBorden & his note reviewed; on Flomax, uses prostaglandin shots  for ED prn; PSA=2.0 in March...  DJD>  He had left knee shot from DrRowan recently; uses Aleve prn.Marland KitchenMarland Kitchen

## 2010-09-24 ENCOUNTER — Other Ambulatory Visit: Payer: Self-pay | Admitting: *Deleted

## 2010-09-24 ENCOUNTER — Encounter: Payer: Self-pay | Admitting: Pulmonary Disease

## 2010-09-24 MED ORDER — GLIMEPIRIDE 1 MG PO TABS: 1.0000 mg | ORAL_TABLET | ORAL | Status: DC

## 2010-09-25 ENCOUNTER — Other Ambulatory Visit: Payer: Self-pay | Admitting: Pulmonary Disease

## 2010-09-25 DIAGNOSIS — D126 Benign neoplasm of colon, unspecified: Secondary | ICD-10-CM

## 2010-09-25 DIAGNOSIS — K5730 Diverticulosis of large intestine without perforation or abscess without bleeding: Secondary | ICD-10-CM

## 2010-11-01 LAB — I-STAT 8, (EC8 V) (CONVERTED LAB)
Bicarbonate: 23.9
HCT: 50
Hemoglobin: 17
Operator id: 288831
TCO2: 25
pCO2, Ven: 36.8 — ABNORMAL LOW

## 2010-11-01 LAB — DIFFERENTIAL
Basophils Absolute: 0
Basophils Absolute: 0
Basophils Relative: 0
Eosinophils Absolute: 0
Eosinophils Relative: 0
Eosinophils Relative: 4
Lymphocytes Relative: 24
Lymphocytes Relative: 3 — ABNORMAL LOW
Lymphs Abs: 1.5
Monocytes Absolute: 0.8
Neutro Abs: 3.6
Neutrophils Relative %: 59

## 2010-11-01 LAB — GASTRIC OCCULT BLOOD (1-CARD TO LAB): Occult Blood, Gastric: POSITIVE — AB

## 2010-11-01 LAB — BASIC METABOLIC PANEL
BUN: 19
BUN: 7
CO2: 28
Calcium: 8.2 — ABNORMAL LOW
Chloride: 106
GFR calc non Af Amer: >60
Glucose, Bld: 114 — ABNORMAL HIGH
Potassium: 3.4 — ABNORMAL LOW
Potassium: 3.6
Sodium: 139

## 2010-11-01 LAB — CARDIAC PANEL(CRET KIN+CKTOT+MB+TROPI)
CK, MB: 1.4
CK, MB: 2
Total CK: 128
Troponin I: 0.01
Troponin I: <0.01

## 2010-11-01 LAB — COMPREHENSIVE METABOLIC PANEL
AST: 249 — ABNORMAL HIGH
AST: 35
Albumin: 2.9 — ABNORMAL LOW
Alkaline Phosphatase: 86
BUN: 12
BUN: 22
CO2: 24
Calcium: 8 — ABNORMAL LOW
Chloride: 107
Creatinine, Ser: 1.05
Creatinine, Ser: 1.24
GFR calc Af Amer: >60
GFR calc Af Amer: >60
GFR calc non Af Amer: 58 — ABNORMAL LOW
GFR calc non Af Amer: >60
Potassium: 4.3
Total Bilirubin: 0.6
Total Bilirubin: 2.1 — ABNORMAL HIGH

## 2010-11-01 LAB — CBC
HCT: 38.3 — ABNORMAL LOW
HCT: 38.9 — ABNORMAL LOW
MCHC: 33.7
MCHC: 33.9
MCHC: 34.8
MCV: 94.9
MCV: 95.7
Platelets: 251
Platelets: 267
Platelets: 288
RDW: 14.8
RDW: 15.1
RDW: 15.1

## 2010-11-01 LAB — HEPATITIS PANEL, ACUTE
HCV Ab: NEGATIVE
Hep A IgM: NEGATIVE
Hepatitis B Surface Ag: NEGATIVE

## 2010-11-01 LAB — LIPID PANEL
LDL Cholesterol: 56
Total CHOL/HDL Ratio: 4.2
Triglycerides: 72
VLDL: 14
VLDL: 18

## 2010-11-01 LAB — HEPATIC FUNCTION PANEL
ALT: 70 — ABNORMAL HIGH
AST: 20
Albumin: 3 — ABNORMAL LOW
Alkaline Phosphatase: 62
Total Bilirubin: 0.7

## 2010-11-01 LAB — CK TOTAL AND CKMB (NOT AT ARMC): Total CK: 128

## 2010-11-01 LAB — POCT CARDIAC MARKERS: Troponin i, poc: <0.05

## 2010-11-01 LAB — OCCULT BLOOD X 1 CARD TO LAB, STOOL: Fecal Occult Bld: NEGATIVE

## 2010-11-01 LAB — HEMOGLOBIN A1C
Hgb A1c MFr Bld: 6.7 — ABNORMAL HIGH
Mean Plasma Glucose: 161

## 2010-11-01 LAB — SEDIMENTATION RATE: Sed Rate: 3

## 2010-11-11 ENCOUNTER — Other Ambulatory Visit: Payer: Self-pay | Admitting: Pulmonary Disease

## 2010-11-19 LAB — BASIC METABOLIC PANEL
BUN: 15
Chloride: 97
Creatinine, Ser: 1.03
GFR calc non Af Amer: >60
Glucose, Bld: 150 — ABNORMAL HIGH
Potassium: 3.7

## 2010-11-19 LAB — POCT HEMOGLOBIN-HEMACUE: Operator id: 268271

## 2011-03-19 ENCOUNTER — Ambulatory Visit (INDEPENDENT_AMBULATORY_CARE_PROVIDER_SITE_OTHER): Payer: Medicare Other | Admitting: Pulmonary Disease

## 2011-03-19 ENCOUNTER — Encounter: Payer: Self-pay | Admitting: Pulmonary Disease

## 2011-03-19 ENCOUNTER — Other Ambulatory Visit (INDEPENDENT_AMBULATORY_CARE_PROVIDER_SITE_OTHER): Payer: Medicare Other

## 2011-03-19 DIAGNOSIS — I2510 Atherosclerotic heart disease of native coronary artery without angina pectoris: Secondary | ICD-10-CM

## 2011-03-19 DIAGNOSIS — E785 Hyperlipidemia, unspecified: Secondary | ICD-10-CM

## 2011-03-19 DIAGNOSIS — I10 Essential (primary) hypertension: Secondary | ICD-10-CM

## 2011-03-19 DIAGNOSIS — E119 Type 2 diabetes mellitus without complications: Secondary | ICD-10-CM

## 2011-03-19 DIAGNOSIS — F411 Generalized anxiety disorder: Secondary | ICD-10-CM

## 2011-03-19 DIAGNOSIS — N401 Enlarged prostate with lower urinary tract symptoms: Secondary | ICD-10-CM

## 2011-03-19 DIAGNOSIS — K2970 Gastritis, unspecified, without bleeding: Secondary | ICD-10-CM

## 2011-03-19 DIAGNOSIS — K5730 Diverticulosis of large intestine without perforation or abscess without bleeding: Secondary | ICD-10-CM

## 2011-03-19 DIAGNOSIS — M1990 Unspecified osteoarthritis, unspecified site: Secondary | ICD-10-CM

## 2011-03-19 DIAGNOSIS — N138 Other obstructive and reflux uropathy: Secondary | ICD-10-CM

## 2011-03-19 DIAGNOSIS — K2990 Gastroduodenitis, unspecified, without bleeding: Secondary | ICD-10-CM

## 2011-03-19 DIAGNOSIS — E041 Nontoxic single thyroid nodule: Secondary | ICD-10-CM

## 2011-03-19 LAB — LIPID PANEL
HDL: 34.3 mg/dL — ABNORMAL LOW (ref >39.00)
LDL Cholesterol: 112 mg/dL — ABNORMAL HIGH (ref 0–99)
Total CHOL/HDL Ratio: 5
Triglycerides: 178 mg/dL — ABNORMAL HIGH (ref 0.0–149.0)

## 2011-03-19 LAB — HEPATIC FUNCTION PANEL
Alkaline Phosphatase: 59 U/L (ref 39–117)
Bilirubin, Direct: 0.2 mg/dL (ref 0.0–0.3)
Total Bilirubin: 0.7 mg/dL (ref 0.3–1.2)

## 2011-03-19 LAB — CBC WITH DIFFERENTIAL/PLATELET
Basophils Relative: 0.4 % (ref 0.0–3.0)
Eosinophils Absolute: 0.3 10*3/uL (ref 0.0–0.7)
HCT: 46.9 % (ref 39.0–52.0)
Hemoglobin: 16.3 g/dL (ref 13.0–17.0)
Lymphs Abs: 2.1 10*3/uL (ref 0.7–4.0)
MCHC: 34.8 g/dL (ref 30.0–36.0)
MCV: 96.8 fl (ref 78.0–100.0)
Monocytes Absolute: 0.7 10*3/uL (ref 0.1–1.0)
Neutro Abs: 3 10*3/uL (ref 1.4–7.7)
RBC: 4.84 Mil/uL (ref 4.22–5.81)
RDW: 14.3 % (ref 11.5–14.6)

## 2011-03-19 LAB — PSA: PSA: 2.57 ng/mL (ref 0.10–4.00)

## 2011-03-19 LAB — BASIC METABOLIC PANEL
Calcium: 10.1 mg/dL (ref 8.4–10.5)
Creatinine, Ser: 1.1 mg/dL (ref 0.4–1.5)
Sodium: 141 mEq/L (ref 135–145)

## 2011-03-19 NOTE — Patient Instructions (Signed)
Today we updated your med list in our EPIC system...    Continue your current medications the same...  Today we did your follow up fasting blood work...    Please call the PHONE TREE in a few days for your results...    Dial [id number] & when prompted enter your patient number followed by the # symbol...    Your patient number is:  [phone]  Call for any questions...  Stay as active as possible...  Let's continue our 6 month follow up visits.Marland KitchenMarland Kitchen

## 2011-03-19 NOTE — Progress Notes (Signed)
Subjective:    Patient ID: [name], male    DOB: [birth year], 77 y.o.   MRN: [id number]  HPI 77 y/o WM here for a follow up visit... he has multiple medical problems as noted below... Followed for general medical purposes w/ HBP, CAD, Hyperlipidemia, DM, DJD, etc... also sees DrWall for Cards, DrPerry for GI (Gastritis, Divertics, Polyps), & DrBorden for Urology (Hx Kidney stones & BPH)...   ~  March 15, 2010:  He was hosp 9/11 by [facility] w/ atypCP & seen by Walker Kehr- no further testing felt nec;  he gave them a very old med list he had in his wallet & they didn't check [employer] EMR- so Metoprolol was not given or included in his disch med rec;  BP has been running high 150-160+ & this is the 1st medical f/u since then> rec to re-start the METOPROLOL 50mg Bid.    He saw Urology, DrBorden 11/11 for f/u BPH, hx kid stones, ED & prostate cancer screening> he passed an 8mm distal right ureteral stone & has bilat nonobstructing stones in kidneys, stable on Flomax & prostaglandin injections Prn for ED.    Currently feeling well- only c/o knee arthritis as before L worse than R & holding off on TKRs "I want to keep them a while longer";  states chest OK w/o CP, palp, SOB, edema;  controls Lipids w/ diet but weight up 10# & not checking BS at home;  wants 90d refills of all meds.  ~  September 16, 2010:  7mo ROV> generally feeling well, hx severe DJD in kness as noted, "I get plenty of exercise", had shot in knee recently by [facility], takes Aleve prn...    He saw DrBorden 3/12 for f/u stones, BPH w/ LUTS, ED & PSA screening> doing well on Flomax, PSA= 2.0, he wanted to do 24H urine but pt declined.    Noted small lump in ant neck 7/12 & saw TP; Thyroid Sonar showed 3 sm (<52mm) thyr cysts- the palp lesion is in left SCM muscle w/ benign features; we will follow clinically- no changes noted.    LABS 7/12 reviewed: BS=142 (A1c today= 8.4); other Chems/ CBC/ TFTs> all wnl; REC- add GLIMEPIRIDE 1mg /d w/ A1c  continuing to rise...  ~  March 19, 2011:  7mo ROV & he reports a good interval, notes that his home BS checks have all been good, brother adm to [facility] w/ Alz...    <HBP> controlled on Metop50Bid, Amlod10, LisinHCT 20-12.5, & K20/d; BP= 134/82 & he denies CP, palpit, dizzy, SOB, edema, etc...    <CAD> known non-obstructive CAD followed by DrWall/Nishan & doing well w/o CP or other symptoms...    <Hyperlipid> on diet alone and FLP today shows TChol 182, TG 178, HDL 34, LDL 112; needs better low fat diet & wt reduction...    <DM> on Metform500Bid + Glimep1mg /d; states BS at home all 100-110 but FBS today= 160 & A1c=7.8; may need incr dose, for now get wt down!    <GI- Gastritis, Divertics, Polyps> stable & doing well on Prilosec prn & overdue for colon screen & he will call...    <GU- BPH, KidStones> on Flomax & doing well w/o signif urinary symptoms...    <DJD> followed by Vinetta Bergamo on OTC meds andhe notes shots help some...          Problem List:  HEARING LOSS - he also sees DrShoemaker w/ hearing eval 7/09 & new hearing aides...  HYPERTENSION (ICD-401.9) - controlled on  METOPROLOL 50mg Bid, LISINOPRIL/HCT 20-12.5, KCl 20/d & NORVASC 10mg /d...  BP is 134/82 here today> he knows to follow a low salt diet and needs to lose wt... he is asymtomatic- denies HA, fatigue, visual changes, CP, palipit, dizziness, syncope, dyspnea, edema, etc... ~  3/10:  Metoprolol 50mg  Bid added to his regimen. ~  2/12:  he was using an OLD MED LIST (which we discarded) & we reviewed his 4 med regimen & asked him to monitor BP at home. ~  2/13:  BP remains well controlled on his regimen...  CAD (ICD-414.00) - Hx non-obstructive disease on cath 6/04 w/ 50%3rdDiag branch of LAD, 40%CIRC, 30%distalRCA... he takes ASA 81mg  daily and practicing secondary risk factor reduction strategy...  currently w/o CP, palpit, change in breathing, etc... last seen by DrWall 4/10> stable, no changes made. ~  3/10:  Myoview showed mild global  HK, EF= 47%, apic thinning vs sm infarct & ?mild ischemia. ~  7/11:  no CP, palpit, SOB, etc... he is active- walking, yard, etc... ~  9/11:  adm by [facility] w/ atypCP, seen by Walker Kehr & no further testing felt necessary.  HYPERLIPIDEMIA, BORDERLINE (ICD-272.4) - on diet alone... ~  FLP 12/07 showing TChol 157, TG 137, HDL 35, LDL 95 ~  FLP 1/09 showed TChol 170, TG 144, HDL 32, LDL 109 ~  FLP 7/09 (wt= 233#) showed TChol 158, TG 162, HDL 31, LDL 95 ~  FLP in hosp 3/10 showed TChol 154, TG 177, HDL 25, LDL 94... he doesn't want Fibrate Rx. ~  FLP 7/10 showed TChol 160, TG 162, HDL 30, LDL 98... still refuses med Rx... ~  FLP 1/11 showed TChol 174, TG 170, HDL 36, LDL 104 ~  FLP 7/11 showed TChol 161, TG 192, HDL 34, LDL 88 ~  FLP 2/12 showed TChol 166, TG 273, HDL 31, LDL 103... worse TG- may need Fibrate if wt not coming down! ~  FLP 2/13 on diet alone showed TChol 182, TG 178, HDL 34, LDL 112  AODM (ICD-250.00) - on METFORMIN 500mg Bid + GLIMEPIRIDE 1mg /d (added 8/12); discussed weight reduction strategies... ~  in 2008 BS=127, & HgA1c=6.5 on Metformin 500Bid... pt decr to 1/d on his own. ~  labs 1/09 showed BS= 143, HgA1c= 6.8.Marland Kitchen. ~  labs 7/09 (wt= 233#) showed BS= 136, HgA1c= 6.9.Marland KitchenMarland Kitchen rec- OK on Metform500/d, needs better diet. ~  labs 2/10 (wt= 242#) showed BS= 171, HgA1c= 6.9.Marland Kitchen. rec> get wt down. ~  labs 7/10 (wt= 240#) showed BS= 135, A1c= 7.0.Marland KitchenMarland Kitchen may need additional med. ~  labs 1/11 (wt=245#) showed BS= 149, A1c= 7.1 ~  labs 7/11 (wt= 226#) showed BS= 146, A1c= 7.3 ~  labs 2/12 (wt=236#) showed BS= 151, A1c= 7.7.Marland Kitchen. rec> incr METFORM500Bid & get wt down. ~  Labs 8/12 (wt=232#) showed BS= 142, A1c= 8.4... Rec> add GLIMEPIRIDE 1mg /d. ~  Labs 2/13 (wt=241#) showed BS=160, A1c= 7.8.Marland KitchenMarland Kitchen Must get wt down or incr meds...  Hx of GASTRITIS (ICD-535.50) - on PRILOSEC 20mg  OTC Prn... ~  EGD 2/09 by DrDBrodie showed 2cmHH, mod esophagitis, duodenitis... Rx'd ProtonixBid.  DIVERTICULOSIS OF COLON  (ICD-562.10) >> COLONIC POLYPS (ICD-211.3) >> ~  Colonoscopy 9/05 by DrPerry showed divertics & diminutive polyp... f/u planned 5 years. ~  Colonoscopy 4/11 showed mod divertics, evid of prior sigmoid resection (?- we don't have hx of this), int hems...  HYPERTROPHY PROSTATE W/UR OBST & OTH LUTS (ICD-600.01) RENAL CALCULUS (ICD-592.0) - on FLOMAX 0.4mg /d per Urology & followed for BPH, Urolithiasis, ED & PSA  screening purposes. ~  he has prev required cystoscopy w/ stents, & prev had lithotripsy as well...  ~  labs 7/10 showed PSA= 2.09, & subseq labs by DrRDavis (we don't have his notes). ~  11/11:  seen by DrBorden & passed an 8mm right ureteral stone, XRays show bilat nonobstructing stones in kidneys. ~  3/12:  Continued f/u w/ DrBorden & PSA= 2.0 ~  2/13:  PSA= 2.57 & he is voiding satis  Hx of PYELONEPHRITIS (ICD-590.80) - Adm 4/08 w/ pyelo from obstructing R kidney stone requiring cysto, stent, laser fragmentation...  DEGENERATIVE JOINT DISEASE (ICD-715.90) - he uses OTC meds w/ relief... followed by Sherin Quarry w/ "endstage" DJD in knees (left worse than right) & told he needs TKRs- he is determined to "keep them a little bit longer"...  ANXIETY (ICD-300.00) - his wife passed away 07-23-05 & he has been enjoying fishing, hunting, and taking care of his 30 y/o grandson.   Past Surgical History  Procedure Date  . Appendectomy   . Cataract extraction   . Knee surgery     bilateral  . Shoulder surgery 04/2005    left by Dr. Luiz Blare  . S/p elap July 23, 1984 w/excision of leiomyoma @ ge junction, meckle's divertic resected, & cholecystectomy   . S/p cysto & stents for kidnedy stone 11/08 by dr. Wanda Plump     Outpatient Encounter Prescriptions as of 03/19/2011  Medication Sig Dispense Refill  . amLODipine (NORVASC) 10 MG tablet Take 1 tablet by mouth daily.      Marland Kitchen aspirin 325 MG tablet Take 325 mg by mouth daily.        Marland Kitchen glimepiride (AMARYL) 1 MG tablet Take 1 tablet (1 mg total) by mouth every  morning.  90 tablet  3  . lisinopril-hydrochlorothiazide (PRINZIDE,ZESTORETIC) 20-12.5 MG per tablet Take 1 tablet by mouth daily.      . metFORMIN (GLUCOPHAGE) 500 MG tablet Take 1 tablet by mouth Twice daily.      . metoprolol (LOPRESSOR) 50 MG tablet Take 1 tablet by mouth Twice daily.      . potassium chloride SA (K-DUR,KLOR-CON) 20 MEQ tablet Take 1 tablet by mouth daily.      . Tamsulosin HCl (FLOMAX) 0.4 MG CAPS Take 1 tablet by mouth daily.      Marland Kitchen VIAGRA 100 MG tablet USE AS DIRECTED  10 each  6    Allergies  Allergen Reactions  . Codeine Phosphate     REACTION: nausea  . Morphine     REACTION: nausea    Current Medications, Allergies, Past Medical History, Past Surgical History, Family History, and Social History were reviewed in [employer] record.    Review of Systems    See HPI - all other systems neg except as noted...  The patient complains of decreased hearing, dyspnea on exertion, and difficulty walking.  The patient denies anorexia, fever, weight loss, weight gain, vision loss, hoarseness, chest pain, syncope, peripheral edema, prolonged cough, headaches, hemoptysis, abdominal pain, melena, hematochezia, severe indigestion/heartburn, hematuria, incontinence, muscle weakness, suspicious skin lesions, transient blindness, depression, unusual weight change, abnormal bleeding, enlarged lymph nodes, and angioedema.     Objective:   Physical Exam    WD, Overweight, 77 y/o WM in NAD... GENERAL:  Alert & oriented; pleasant & cooperative... HEENT:  [place]/AT, EOM-wnl, PERRLA, EACs-clear, TMs-wnl, NOSE-clear, THROAT-clear & wnl. NECK:  Supple w/ fairROM; no JVD; normal carotid impulses w/o bruits; no thyromegaly or nodules palpated; no lymphadenopathy. CHEST:  Clear to P &  A; without wheezes/ rales/ or rhonchi. HEART:  Regular Rhythm; without murmurs/ rubs/ or gallops. ABDOMEN:  Soft & nontender; normal bowel sounds; no organomegaly or masses  detected. EXT:  mod arthritic changes; no varicose veins/ +venous insuffic/ or edema. NEURO:  CN's intact;  no focal neuro deficits... DERM:  No lesions noted; no rash etc...  RADIOLOGY DATA:  Reviewed in the EPIC EMR & discussed w/ the patient...  LABORATORY DATA:  Reviewed in the EPIC EMR & discussed w/ the patient...   Assessment & Plan:   HBP>  BP controlled on 4 meds as listed, encouraged to take regularly esp in view of his DM, low salt diet too...  CAD>  Continue ASA & secondary risk factor reduction strategy; he has seen DrNishan & DrWall in the past...  HYPERLIPIDEMIA>  On diet alone but wt too hi & TG too hi; needs better low carb, low fat wt reducing diet efforts...  DM>  On 2 meds now & A1c improved but not at goal; may need to increase meds but would prefer wt reduction etc...  GI> Gastritis, Divertics, Polyps>  He uses Prilosec prn; had screening colon f/u 4/11 & neg w/o polyps etc...  GU> Kidney Stones, BPH w/ LTOS, ED>  Followed by DrBorden & his note reviewed; on Flomax, uses prostaglandin shots for ED prn; PSA=2.57  DJD>  He had left knee shot from DrRowan recently; uses Aleve prn...   Patient's Medications  New Prescriptions   No medications on file  Previous Medications   AMLODIPINE (NORVASC) 10 MG TABLET    Take 1 tablet by mouth daily.   ASPIRIN 325 MG TABLET    Take 325 mg by mouth daily.     GLIMEPIRIDE (AMARYL) 1 MG TABLET    Take 1 tablet (1 mg total) by mouth every morning.   LISINOPRIL-HYDROCHLOROTHIAZIDE (PRINZIDE,ZESTORETIC) 20-12.5 MG PER TABLET    Take 1 tablet by mouth daily.   METOPROLOL (LOPRESSOR) 50 MG TABLET    Take 1 tablet by mouth Twice daily.   POTASSIUM CHLORIDE SA (K-DUR,KLOR-CON) 20 MEQ TABLET    Take 1 tablet by mouth daily.   TAMSULOSIN HCL (FLOMAX) 0.4 MG CAPS    Take 1 tablet by mouth daily.   VIAGRA 100 MG TABLET    USE AS DIRECTED  Modified Medications   Modified Medication Previous Medication   METFORMIN (GLUCOPHAGE) 500 MG  TABLET metFORMIN (GLUCOPHAGE) 500 MG tablet      TAKE ONE TABLET BY MOUTH TWICE DAILY    Take 1 tablet by mouth Twice daily.  Discontinued Medications   No medications on file

## 2011-04-02 ENCOUNTER — Other Ambulatory Visit: Payer: Self-pay | Admitting: Pulmonary Disease

## 2011-04-16 ENCOUNTER — Other Ambulatory Visit: Payer: Self-pay | Admitting: *Deleted

## 2011-04-16 MED ORDER — METFORMIN HCL 500 MG PO TABS: 500.0000 mg | ORAL_TABLET | Freq: Two times a day (BID) | ORAL | Status: DC

## 2011-04-16 MED ORDER — TAMSULOSIN HCL 0.4 MG PO CAPS: 0.4000 mg | ORAL_CAPSULE | Freq: Every day | ORAL | Status: DC

## 2011-04-16 MED ORDER — LISINOPRIL-HYDROCHLOROTHIAZIDE 20-12.5 MG PO TABS: 1.0000 | ORAL_TABLET | Freq: Every day | ORAL | Status: DC

## 2011-04-17 ENCOUNTER — Ambulatory Visit (HOSPITAL_COMMUNITY)
Admission: EM | Admit: 2011-04-17 | Discharge: 2011-04-17 | Disposition: A | Discharge status: Home / Self Care | Payer: Medicare Other | Attending: Urology | Admitting: Urology

## 2011-04-17 ENCOUNTER — Other Ambulatory Visit: Payer: Self-pay | Admitting: Urology

## 2011-04-17 ENCOUNTER — Encounter (HOSPITAL_COMMUNITY): Payer: Self-pay | Admitting: Anesthesiology

## 2011-04-17 ENCOUNTER — Other Ambulatory Visit: Payer: Self-pay

## 2011-04-17 ENCOUNTER — Encounter (HOSPITAL_COMMUNITY): Admission: EM | Disposition: A | Discharge status: Home / Self Care | Payer: Self-pay | Source: Home / Self Care

## 2011-04-17 ENCOUNTER — Encounter (HOSPITAL_COMMUNITY): Payer: Self-pay | Admitting: Emergency Medicine

## 2011-04-17 ENCOUNTER — Emergency Department (HOSPITAL_COMMUNITY): Payer: Medicare Other

## 2011-04-17 ENCOUNTER — Emergency Department (HOSPITAL_COMMUNITY): Payer: Medicare Other | Admitting: Anesthesiology

## 2011-04-17 DIAGNOSIS — N23 Unspecified renal colic: Secondary | ICD-10-CM

## 2011-04-17 DIAGNOSIS — R112 Nausea with vomiting, unspecified: Secondary | ICD-10-CM | POA: Insufficient documentation

## 2011-04-17 DIAGNOSIS — N1330 Unspecified hydronephrosis: Secondary | ICD-10-CM | POA: Insufficient documentation

## 2011-04-17 DIAGNOSIS — R109 Unspecified abdominal pain: Secondary | ICD-10-CM | POA: Insufficient documentation

## 2011-04-17 DIAGNOSIS — N200 Calculus of kidney: Secondary | ICD-10-CM | POA: Insufficient documentation

## 2011-04-17 DIAGNOSIS — I10 Essential (primary) hypertension: Secondary | ICD-10-CM | POA: Insufficient documentation

## 2011-04-17 DIAGNOSIS — I2510 Atherosclerotic heart disease of native coronary artery without angina pectoris: Secondary | ICD-10-CM | POA: Insufficient documentation

## 2011-04-17 DIAGNOSIS — E119 Type 2 diabetes mellitus without complications: Secondary | ICD-10-CM | POA: Insufficient documentation

## 2011-04-17 DIAGNOSIS — N201 Calculus of ureter: Secondary | ICD-10-CM | POA: Insufficient documentation

## 2011-04-17 HISTORY — PX: CYSTOSCOPY W/ URETERAL STENT PLACEMENT: SHX1429

## 2011-04-17 HISTORY — PX: OTHER SURGICAL HISTORY: SHX169

## 2011-04-17 LAB — SURGICAL PCR SCREEN
MRSA, PCR: NEGATIVE
Staphylococcus aureus: POSITIVE — AB

## 2011-04-17 LAB — URINE MICROSCOPIC-ADD ON

## 2011-04-17 LAB — URINALYSIS, ROUTINE W REFLEX MICROSCOPIC
Bilirubin Urine: NEGATIVE
Glucose, UA: 500 mg/dL — AB
Nitrite: NEGATIVE
Specific Gravity, Urine: 1.02 (ref 1.005–1.030)
pH: 6 (ref 5.0–8.0)

## 2011-04-17 SURGERY — CYSTOSCOPY, WITH RETROGRADE PYELOGRAM AND URETERAL STENT INSERTION
Anesthesia: General | Site: Bladder | Laterality: Left | Wound class: Clean Contaminated

## 2011-04-17 MED ORDER — FENTANYL CITRATE 0.05 MG/ML IJ SOLN
INTRAMUSCULAR | Status: DC | PRN
  Administered 2011-04-17: 50 ug via INTRAVENOUS
  Administered 2011-04-17 (×2): 25 ug via INTRAVENOUS

## 2011-04-17 MED ORDER — ONDANSETRON HCL 4 MG/2ML IJ SOLN
4.0000 mg | Freq: Once | INTRAMUSCULAR | Status: AC
  Administered 2011-04-17: 4 mg via INTRAVENOUS
  Filled 2011-04-17: qty 2

## 2011-04-17 MED ORDER — SODIUM CHLORIDE 0.9 % IR SOLN
Status: DC | PRN
  Administered 2011-04-17: 3000 mL

## 2011-04-17 MED ORDER — ONDANSETRON HCL 4 MG/2ML IJ SOLN
4.0000 mg | Freq: Once | INTRAMUSCULAR | Status: AC
  Administered 2011-04-17 (×2): 4 mg via INTRAVENOUS

## 2011-04-17 MED ORDER — PROMETHAZINE HCL 25 MG/ML IJ SOLN: 6.2500 mg | INTRAMUSCULAR | Status: DC | PRN

## 2011-04-17 MED ORDER — LACTATED RINGERS IV SOLN: INTRAVENOUS | Status: DC

## 2011-04-17 MED ORDER — INSULIN ASPART 100 UNIT/ML ~~LOC~~ SOLN
3.0000 [IU] | Freq: Once | SUBCUTANEOUS | Status: AC
  Administered 2011-04-17: 3 [IU] via SUBCUTANEOUS

## 2011-04-17 MED ORDER — SODIUM CHLORIDE 0.9 % IV SOLN
Freq: Once | INTRAVENOUS | Status: AC
  Administered 2011-04-17: 08:00:00 via INTRAVENOUS

## 2011-04-17 MED ORDER — HYDROMORPHONE HCL PF 1 MG/ML IJ SOLN
1.0000 mg | Freq: Once | INTRAMUSCULAR | Status: AC
  Administered 2011-04-17: 1 mg via INTRAVENOUS
  Filled 2011-04-17: qty 1

## 2011-04-17 MED ORDER — IOHEXOL 300 MG/ML  SOLN
INTRAMUSCULAR | Status: DC | PRN
  Administered 2011-04-17: 4 mL via INTRAVENOUS

## 2011-04-17 MED ORDER — LACTATED RINGERS IV SOLN
INTRAVENOUS | Status: DC
  Administered 2011-04-17: 1000 mL via INTRAVENOUS

## 2011-04-17 MED ORDER — CEFAZOLIN SODIUM 1-5 GM-% IV SOLN
1.0000 g | INTRAVENOUS | Status: AC
  Administered 2011-04-17: 2 g via INTRAVENOUS

## 2011-04-17 MED ORDER — ONDANSETRON HCL 4 MG/2ML IJ SOLN
INTRAMUSCULAR | Status: AC
  Administered 2011-04-17: 4 mg via INTRAVENOUS
  Filled 2011-04-17: qty 4

## 2011-04-17 MED ORDER — HYDROMORPHONE HCL 2 MG PO TABS
4.0000 mg | ORAL_TABLET | ORAL | Status: AC | PRN
  Administered 2011-04-17: 4 mg via ORAL

## 2011-04-17 MED ORDER — SODIUM CHLORIDE 0.45 % IV SOLN: INTRAVENOUS | Status: DC

## 2011-04-17 MED ORDER — IOHEXOL 300 MG/ML  SOLN
INTRAMUSCULAR | Status: AC
  Filled 2011-04-17: qty 1

## 2011-04-17 MED ORDER — METOPROLOL TARTRATE 50 MG PO TABS
50.0000 mg | ORAL_TABLET | Freq: Every day | ORAL | Status: DC
  Administered 2011-04-17: 50 mg via ORAL
  Filled 2011-04-17: qty 1

## 2011-04-17 MED ORDER — HYDROMORPHONE HCL 4 MG PO TABS: 4.0000 mg | ORAL_TABLET | ORAL | Status: AC | PRN

## 2011-04-17 MED ORDER — INDIGOTINDISULFONATE SODIUM 8 MG/ML IJ SOLN
INTRAMUSCULAR | Status: AC
  Filled 2011-04-17: qty 5

## 2011-04-17 MED ORDER — LACTATED RINGERS IV SOLN
INTRAVENOUS | Status: DC | PRN
  Administered 2011-04-17: 13:00:00 via INTRAVENOUS

## 2011-04-17 MED ORDER — HYDROMORPHONE HCL PF 1 MG/ML IJ SOLN
INTRAMUSCULAR | Status: AC
  Filled 2011-04-17: qty 1

## 2011-04-17 MED ORDER — HYDROMORPHONE HCL PF 1 MG/ML IJ SOLN
0.5000 mg | INTRAMUSCULAR | Status: DC | PRN
  Administered 2011-04-17: 0.5 mg via INTRAVENOUS

## 2011-04-17 MED ORDER — INSULIN ASPART 100 UNIT/ML ~~LOC~~ SOLN
SUBCUTANEOUS | Status: AC
  Filled 2011-04-17: qty 3

## 2011-04-17 MED ORDER — FENTANYL CITRATE 0.05 MG/ML IJ SOLN: 25.0000 ug | INTRAMUSCULAR | Status: DC | PRN

## 2011-04-17 MED ORDER — HYDROMORPHONE HCL 2 MG PO TABS
ORAL_TABLET | ORAL | Status: AC
  Administered 2011-04-17: 4 mg via ORAL
  Filled 2011-04-17: qty 2

## 2011-04-17 MED ORDER — PROPOFOL 10 MG/ML IV BOLUS
INTRAVENOUS | Status: DC | PRN
  Administered 2011-04-17: 150 mg via INTRAVENOUS

## 2011-04-17 MED ORDER — ONDANSETRON HCL 4 MG/2ML IJ SOLN
INTRAMUSCULAR | Status: DC | PRN
  Administered 2011-04-17: 4 mg via INTRAVENOUS

## 2011-04-17 MED ORDER — CEFAZOLIN SODIUM-DEXTROSE 2-3 GM-% IV SOLR
INTRAVENOUS | Status: AC
  Filled 2011-04-17: qty 50

## 2011-04-17 MED ORDER — PROMETHAZINE HCL 25 MG/ML IJ SOLN
12.5000 mg | Freq: Four times a day (QID) | INTRAMUSCULAR | Status: DC | PRN
  Administered 2011-04-17: 12.5 mg via INTRAVENOUS
  Filled 2011-04-17: qty 1

## 2011-04-17 SURGICAL SUPPLY — 18 items
ADAPTER CATH URET PLST 4-6FR (CATHETERS) ×2 IMPLANT
ADPR CATH URET STRL DISP 4-6FR (CATHETERS) ×1
BAG URO CATCHER STRL LF (DRAPE) ×2 IMPLANT
CATH INTERMIT  6FR 70CM (CATHETERS) ×1 IMPLANT
CATH URET 5FR 28IN CONE TIP (BALLOONS) ×1
CATH URET 5FR 70CM CONE TIP (BALLOONS) IMPLANT
CLOTH BEACON ORANGE TIMEOUT ST (SAFETY) ×2 IMPLANT
DRAPE CAMERA CLOSED 9X96 (DRAPES) ×2 IMPLANT
GLOVE BIOGEL M 7.0 STRL (GLOVE) ×2 IMPLANT
GOWN STRL NON-REIN LRG LVL3 (GOWN DISPOSABLE) ×4 IMPLANT
GUIDEWIRE STR DUAL SENSOR (WIRE) ×1 IMPLANT
MANIFOLD NEPTUNE II (INSTRUMENTS) ×2 IMPLANT
MARKER SKIN DUAL TIP RULER LAB (MISCELLANEOUS) ×1 IMPLANT
NS IRRIG 1000ML POUR BTL (IV SOLUTION) ×2 IMPLANT
PACK CYSTO (CUSTOM PROCEDURE TRAY) ×2 IMPLANT
STENT CONTOUR 6FRX24X.038 (STENTS) ×1 IMPLANT
TUBING CONNECTING 10 (TUBING) ×2 IMPLANT
WIRE COONS/BENSON .038X145CM (WIRE) ×1 IMPLANT

## 2011-04-17 NOTE — ED Notes (Signed)
Pt alert and oriented x4. Respirations even and unlabored, bilateral symmetrical rise and fall of chest. Skin warm and dry. In no acute distress. Denies needs.   

## 2011-04-17 NOTE — Progress Notes (Signed)
Up with help to BR voided large amount of clear yellow urine no stones or blood noter.

## 2011-04-17 NOTE — H&P (Signed)
History and Physical  Chief Complaint: Left lower quadrant pain.  History of Present Illness: The patient is a 77 years old male who presents to the ER this morning with sudden onset of severe left flank pain radiating to the left lower quadrant associated with nausea and vomiting.He has a past history of kidney stone and had ESL in the past.  CT scan revealed a 6 x7 mm stone in the lower pole of the right kidney with several punctate right renal calculi.  It also shows moderate left hydronephrosis secondary to a 7 X 5 mm proximal ureteral calculus.  There is a hyperdense lesion in the upper pole of the left kidney that has increased in size and is suggestive of a hemorrhagic or proteinaceous cyst.  He was given IV analgesics and still complains of severe left lower quadrant pain.  He is on Flomax and has nocturia x 2.  He denies hematuria, dysuria.  Past Medical History  Diagnosis Date  . CAD (coronary artery disease)   . History of syncope   . HTN (hypertension)   . Borderline hyperlipidemia   . History of gastritis   . Diverticulosis of colon   . Colon polyps   . Hypertrophy of prostate with urinary obstruction and other lower urinary tract symptoms (LUTS)   . Renal calculus   . History of pyelonephritis   . DJD (degenerative joint disease)   . Anxiety   . DM (diabetes mellitus)     Adult onset   Past Surgical History  Procedure Date  . Appendectomy   . Cataract extraction   . Knee surgery     bilateral  . Shoulder surgery 04/2005    left by Dr. Luiz Blare  . 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 S/p cysto & stents for kidnedy stone 11/08 by dr. Wanda Plump     Medications:Flomax, Metoprolol, Novolog Insulin Allergies:  Allergies  Allergen Reactions  . Codeine Phosphate     REACTION: nausea  . Morphine     REACTION: nausea    Family History  Problem Relation Age of Onset  . Heart attack Mother   . Heart  attack Mother    Social History:  reports that he has never smoked. He does not have any smokeless tobacco history on file. He reports that he does not drink alcohol or use illicit drugs.  ROS: All systems are reviewed and negative except as noted.   Physical Exam:  Vital signs in last 24 hours: Temp:  [97.5 F (36.4 C)-98.6 F (37 C)] 98.6 F (37 C) (03/07 1040) Pulse Rate:  [84-96] 96  (03/07 1040) Resp:  [16-18] 16  (03/07 1040) BP: (141-162)/(76-87) 162/84 mmHg (03/07 1040) SpO2:  [94 %-97 %] 94 % (03/07 1040) Weight:  [235 lb (106.595 kg)] 235 lb (106.595 kg) (03/07 4010) Head: Normal.  Pink conjunctivae. ENT: within normal limits. Neck: supple, no cervical adenopathy, no thyromegaly. Cardiovascular: Skin warm; not flushed Respiratory: Breaths quiet; no shortness of breath Abdomen: No masses.  Tender in the left lower quadrant.  Moderate left CVA tenderness.                  No inguinal hernia. Neurological: Normal sensation to touch Musculoskeletal: Normal motor function arms and legs Lymphatics: No inguinal adenopathy Skin: No rashes Genitourinary:The scrotum is normal in appearance.  There is no testicular mass.  Cords and epididymides are normal. Rectal: Sphincter tone is normal.  Prostate  is enlarged 40 gm, no nodules.  Laboratory Data:  Results for orders placed during the hospital encounter of 04/17/11 (from the past 24 hour(s))  URINALYSIS, ROUTINE W REFLEX MICROSCOPIC     Status: Abnormal   Collection Time   04/17/11  6:39 AM      Component Value Range   Color, Urine YELLOW  YELLOW    APPearance CLOUDY (*) CLEAR    Specific Gravity, Urine 1.020  1.005 - 1.030    pH 6.0  5.0 - 8.0    Glucose, UA 500 (*) NEGATIVE (mg/dL)   Hgb urine dipstick LARGE (*) NEGATIVE    Bilirubin Urine NEGATIVE  NEGATIVE    Ketones, ur NEGATIVE  NEGATIVE (mg/dL)   Protein, ur 30 (*) NEGATIVE (mg/dL)   Urobilinogen, UA 0.2  0.0 - 1.0 (mg/dL)   Nitrite NEGATIVE  NEGATIVE     Leukocytes, UA NEGATIVE  NEGATIVE   URINE MICROSCOPIC-ADD ON     Status: Abnormal   Collection Time   04/17/11  6:39 AM      Component Value Range   Squamous Epithelial / LPF RARE  RARE    WBC, UA 0-2  <3 (WBC/hpf)   RBC / HPF TOO NUMEROUS TO COUNT  <3 (RBC/hpf)   Bacteria, UA FEW (*) RARE   GLUCOSE, CAPILLARY     Status: Abnormal   Collection Time   04/17/11 11:07 AM      Component Value Range   Glucose-Capillary 268 (*) 70 - 99 (mg/dL)   Comment 1 Call MD NNP PA CNM      Xrays: I independently reviewed the ct scan and the findings are as noted above.  He also sigmoid diverticulosis.  Impression/Assessment:  Left proximal ureteral calculus, left hydronephrosis.  Right renal calculi.  Diabetes, Hypertension.  Plan:  Cystoscopy, Left retrograde pyelogram and JJ stent placement.  Giana Castner-HENRY 04/17/2011, 11:30 AM

## 2011-04-17 NOTE — Preoperative (Signed)
Beta Blockers   Reason not to administer Beta Blockers:Not Applicable 

## 2011-04-17 NOTE — ED Notes (Signed)
Dr Brunilda Payor at bedside. Pt will go to the OR around 1330.

## 2011-04-17 NOTE — ED Notes (Signed)
RUE:AV40<JW> Expected date:04/17/11<BR> Expected time: 6:07 AM<BR> Means of arrival:Ambulance<BR> Comments:<BR> abd pain

## 2011-04-17 NOTE — Transfer of Care (Signed)
Immediate Anesthesia Transfer of Care Note  [name]  Procedure(s) Performed: Procedure(s) (LRB): CYSTOSCOPY WITH RETROGRADE PYELOGRAM/URETERAL STENT PLACEMENT (Left)  Patient Location: PACU  Anesthesia Type: General  Level of Consciousness: awake and patient cooperative  Airway & Oxygen Therapy: Patient Spontanous Breathing and Patient connected to face mask oxygen  Post-op Assessment: Report given to PACU RN and Post -op Vital signs reviewed and stable  Post vital signs: Reviewed and stable  Complications: No apparent anesthesia complications

## 2011-04-17 NOTE — Anesthesia Preprocedure Evaluation (Signed)
Anesthesia Evaluation  Patient identified by MRN, date of birth, ID band Patient awake    Reviewed: Allergy & Precautions, H&P , NPO status , Patient's Chart, lab work & pertinent test results  History of Anesthesia Complications Negative for: history of anesthetic complications  Airway Mallampati: II TM Distance: >3 FB     Dental  (+) Teeth Intact, Poor Dentition and Dental Advisory Given,    Pulmonary neg pulmonary ROS,  breath sounds clear to auscultation  Pulmonary exam normal       Cardiovascular Exercise Tolerance: Poor hypertension, Pt. 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 standing and denies cardiac symptoms) Rhythm:Regular Rate:Normal     Neuro/Psych Anxiety negative neurological ROS     GI/Hepatic negative GI ROS, Neg liver ROS,   Endo/Other  Diabetes mellitus-, Well Controlled, Type 2, Oral Hypoglycemic AgentsMorbid obesity  Renal/GU negative Renal ROS  negative genitourinary   Musculoskeletal negative musculoskeletal ROS (+)   Abdominal (+) + obese,   Peds  Hematology negative hematology ROS (+)   Anesthesia Other Findings   Reproductive/Obstetrics negative OB ROS                           Anesthesia Physical Anesthesia Plan  ASA: III  Anesthesia Plan: General   Post-op Pain Management:    Induction: Intravenous  Airway Management Planned: LMA  Additional Equipment:   Intra-op Plan:   Post-operative Plan:   Informed Consent: I have reviewed the patients History and Physical, chart, labs and discussed the procedure including the risks, benefits and alternatives for the proposed anesthesia with the patient or authorized representative who has indicated his/her understanding and acceptance.   Dental advisory given  Plan Discussed with: CRNA  Anesthesia Plan Comments:          Anesthesia Quick Evaluation

## 2011-04-17 NOTE — Discharge Instructions (Signed)
Ureteral Stent  A ureteral stent is a soft plastic tube with multiple holes. The stent is inserted into a ureter to help drain urine from the kidney into the bladder. The tube has a coil on each end to keep it from falling out. One end stays in the kidney. The other end stays in the bladder. A stent cannot be seen from the outside. Usually it does not keep you from going about normal routines.  A ureteral stent is used to bypass a blockage in your kidney or ureter. This blockage can be caused by kidney stones, scar tissue, pregnancy, or other causes. It can also be used during treatment to remove a kidney stone or to let a ureter heal after surgery. The stent allows urine to drain from the kidney into the bladder. It is most often taken out after the blockage has been removed or the ureter has healed. If a stent is needed for a long time, it will be changed every few months.  INSERTING THE STENT  Your stent is put in by a urologist. This is a medical doctor trained for treating genitourinary (kidney, ureter and bladder) problems. Before your stent is put in, your caregiver may order x-rays or other imaging tests of your kidneys and ureters. The stent is inserted in a hospital or same day surgical center. You can anticipate going home the same day.  PROCEDURE   A special x-ray machine called a fluoroscope is used to guide the insertion of your stent. This allows your doctor to make sure the stent is in the correct place.   First you are given anesthesia to keep you comfortable.   Then your doctor inserts a special lighted instrument called a cystoscope into your bladder. This allows your doctor to see the opening to the ureter.   A thin wire is carefully threaded into the bladder and up the ureter. The stent is inserted over the wire and the wire is then removed.  HOME CARE INSTRUCTIONS    While the stent is in place, you may feel some discomfort. Certain movements may trigger pain or a feeling that you need to  urinate. Your caregiver may give you pain medication. Only take over-the-counter or prescription medicines for pain, discomfort, or fever as directed by your caregiver. Do not take aspirin as this can make bleeding worse.   You may be given medications to prevent infection or bladder spasms. Be sure to take all medications as directed.   Drink plenty of fluids.   You may have small amounts of bleeding causing your urine to be slightly red. This is nothing to be concerned about.  REMOVAL OF THE STENT  Your stent is left in until the blockage is resolved. This may take two weeks or longer. Before the stent is removed, you may have an x-ray make sure the ureter is open. The stent can be removed by your caregiver in the office. Medications may be given for comfort. Be sure to keep all follow-up appointments so your caregiver can check that you are healing properly.  SEEK IMMEDIATE MEDICAL CARE IF:    Your urine is dark red or has blood clots.   You are incontinent (leaking urine).   You have an oral temperature above 102 F (38.9 C), chills, nausea (feeling sick to your stomach), or vomiting.   Your pain is not relieved by pain medication. Do not take aspirin as this can make bleeding worse.   The end of the stent comes   out of the urethra.  Document Released: 01/25/2000 Document Revised: 01/16/2011 Document Reviewed: 01/24/2008  [employer] Patient Information 2012 [employer].

## 2011-04-17 NOTE — ED Provider Notes (Signed)
History     CSN: [id number]  Arrival date & time 04/17/11  4782   First MD Initiated Contact with Patient 04/17/11 0622      Chief Complaint  Patient presents with  . Flank Pain    flank pain left    (Consider location/radiation/quality/duration/timing/severity/associated sxs/prior treatment) HPI Comments: [name] is a 77 y.o. Male who presents with his wife by EMS after receiving fentanyl, multiple doses with Zofran, for left lower abdominal pain. He feels like this is similar to his kidney stone pain. He had nausea and vomiting with the pain. He denies change in urinary habits, or fever. Has had no cough, chest pain, shortness of breath, dizziness, or weakness. His last kidney stone was about 2 years ago. He did not try anything at home for the pain. The medicines given by EMS helped him some.  The history is provided by the patient.    Past Medical History  Diagnosis Date  . CAD (coronary artery disease)   . History of syncope   . HTN (hypertension)   . Borderline hyperlipidemia   . History of gastritis   . Diverticulosis of colon   . Colon polyps   . Hypertrophy of prostate with urinary obstruction and other lower urinary tract symptoms (LUTS)   . Renal calculus   . History of pyelonephritis   . DJD (degenerative joint disease)   . Anxiety   . DM (diabetes mellitus)     Adult onset    Past Surgical History  Procedure Date  . Appendectomy   . Cataract extraction   . Knee surgery     bilateral  . Shoulder surgery 04/2005    left by Dr. Luiz Blare  . 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 S/p cysto & stents for kidnedy stone 11/08 by dr. Wanda Plump     Family History  Problem Relation Age of Onset  . Heart attack Mother   . Heart attack Mother     History  Substance Use Topics  . Smoking status: Never Smoker   . Smokeless tobacco: Not on file  . Alcohol Use: No      Review of Systems    Genitourinary: Positive for flank pain.  All other systems reviewed and are negative.    Allergies  Codeine phosphate and Morphine  Home Medications   Current Outpatient Rx  Name Route Sig Dispense Refill  . AMLODIPINE BESYLATE 10 MG PO TABS Oral Take 1 tablet by mouth daily.    . ASPIRIN 325 MG PO TABS Oral Take 325 mg by mouth daily.      Marland Kitchen GLIMEPIRIDE 1 MG PO TABS Oral Take 1 tablet (1 mg total) by mouth every morning. 90 tablet 3  . LISINOPRIL-HYDROCHLOROTHIAZIDE 20-12.5 MG PO TABS Oral Take 1 tablet by mouth daily. 90 tablet 3  . METFORMIN HCL 500 MG PO TABS Oral Take 1 tablet (500 mg total) by mouth 2 (two) times daily with a meal. 180 tablet 3  . METOPROLOL TARTRATE 50 MG PO TABS Oral Take 1 tablet by mouth Twice daily.    Marland Kitchen POTASSIUM CHLORIDE CRYS ER 20 MEQ PO TBCR Oral Take 1 tablet by mouth daily.    Marland Kitchen TAMSULOSIN HCL 0.4 MG PO CAPS Oral Take 1 capsule (0.4 mg total) by mouth daily. 90 capsule 3  . VIAGRA 100 MG PO TABS  USE AS DIRECTED 10 each 6    BP 155/87  Pulse  95  Temp(Src) 97.8 F (36.6 C) (Oral)  Resp 18  Ht 6' (1.829 m)  Wt 235 lb (106.595 kg)  BMI 31.87 kg/m2  SpO2 97%  Physical Exam  Nursing note and vitals reviewed. Constitutional: He is oriented to person, place, and time. He appears well-developed and well-nourished.  HENT:  Head: Normocephalic and atraumatic.  Right Ear: External ear normal.  Left Ear: External ear normal.  Eyes: Conjunctivae and EOM are normal. Pupils are equal, round, and reactive to light.  Neck: Normal range of motion and phonation normal. Neck supple.  Cardiovascular: Normal rate, regular rhythm, normal heart sounds and intact distal pulses.   Pulmonary/Chest: Effort normal and breath sounds normal. He exhibits no bony tenderness.  Abdominal: Soft. Normal appearance. He exhibits no mass. There is tenderness. There is no rebound and no guarding.       Mild left lower abdominal tenderness, no pulsatile mass.  Genitourinary:        No costovertebral angle  Musculoskeletal: Normal range of motion.  Neurological: He is alert and oriented to person, place, and time. He has normal strength. No cranial nerve deficit or sensory deficit. He exhibits normal muscle tone. Coordination normal.  Skin: Skin is warm, dry and intact.  Psychiatric: He has a normal mood and affect. His behavior is normal. Judgment and thought content normal.    ED Course  Procedures (including critical care time) Emergency department treatment: IV, Dilaudid, and Zofran.    Labs Reviewed  URINALYSIS, ROUTINE W REFLEX MICROSCOPIC - Abnormal; Notable for the following:    APPearance CLOUDY (*)    Glucose, UA 500 (*)    Hgb urine dipstick LARGE (*)    Protein, ur 30 (*)    All other components within normal limits  URINE MICROSCOPIC-ADD ON - Abnormal; Notable for the following:    Bacteria, UA FEW (*)    All other components within normal limits   Ct Abdomen Pelvis Wo Contrast  04/17/2011  *RADIOLOGY REPORT*  Clinical Data: 77 year old male with left flank pain.  History of kidney stones.  CT ABDOMEN AND PELVIS WITHOUT CONTRAST  Technique:  Multidetector CT imaging of the abdomen and pelvis was performed following the standard protocol without intravenous contrast.  Comparison: 06/02/2006.  [facility] KUB 05/01/2010 and earlier.  Findings: Stable lung bases with mild scarring or atelectasis. Trace pericardial effusion anteriorly, favor physiologic.  No pleural effusion.  Degenerative changes in the spine. No acute osseous abnormality identified.  The no pelvic free fluid.  Negative distal colon.  Diverticulosis in the descending colon without associated inflammation.  Retained stool in the more proximal colon.  No large bowel inflammatory change.  No dilated small bowel.  Postoperative changes at the gastroesophageal junction, otherwise negative stomach and duodenum. Gallbladder surgically absent.  Hepatic steatosis is increased and  has a a geographic pattern of involvement in the right lobe. Negative noncontrast spleen, pancreas and adrenal glands.  The right kidney is not obstructed but there is a 6 x 7 mm calculus located in the infrarenal aspect of the renal pelvis.  There are additional punctate intrarenal calculi.  No right perinephric stranding or acute right periureteral stranding.  Left kidney is obstructed.  Mild perinephric stranding.  Mild to moderate left hydronephrosis and hydroureter.  4-5 mm proximal left ureteral calculus is located 5 cm from the left ureteropelvic junction and is 7 mm in length.  Beyond this level, the left ureter is decompressed.  Additionally, there is a hyperdense left upper  pole renal lesion which is mildly increased since 2008.  The prostate is enlarged and heterogeneous indenting the base of the bladder.  The bladder is otherwise unremarkable.  IMPRESSION: 1.  Acute obstructive uropathy on the left.  4-5 mm x 7 mm proximal left ureteral calculus located 5 cm from the left UPJ. 2.  6 x 7 mm calculus at the right renal pelvis, but no right obstructive uropathy at this time. 3.  Hyperdense left upper pole renal lesion measuring 22 mm in diameter has mildly increased in 2008.  Favor benign proteinaceous or hemorrhagic cyst, but recommend imaging surveillance. 4.  Enlarged and heterogeneous prostate. 5.  Hepatic steatosis. 6.  Left colon diverticulosis.  Original Report Authenticated By: Harley Hallmark, M.D.   8:13 AM Reevaluation with update and discussion. After initial assessment and treatment, an updated evaluation reveals the patient has had only minimal relief from the multiple doses of narcotics. Dr. Radford Pax will discuss the case with Urology to arrange for them to see the pt in the ED.  1. Ureteral colic   2. Ureteral stone       MDM    Abdominal pain, secondary to obstructive uropathy left ureter, by large stone.      Flint Melter, MD 04/17/11 [phone]

## 2011-04-17 NOTE — ED Notes (Addendum)
Called short stay pt will be transported to room 5 of short stay. Short stay reported they will complete EKG and obtain consent and prepare pt for the OR.

## 2011-04-17 NOTE — Op Note (Signed)
[name] is a 77 y.o.   04/17/2011  Pre Op Diagnosis: Left ureteral calculus. Left hydronephrosis. Right renal calculi.  Post Op Diagnosis: Same  Procedure done: Cystoscopy, left retrograde pyelogram and insertion of left double-J stent.  Surgeon: Wendie Simmer. [name]  Anesthesia: General  Indication: Patient is a 77 years old male who came to the emergency room this morning with sudden onset of severe left flank pain associated with nausea and vomiting. CT scan showed a 7 x 5 mm obstructing left proximal ureteral calculus with moderate hydronephrosis, right renal calculi. He has a past history of kidney stone. He was given IV analgesics; however he continued to complain of severe pain. He is scheduled for cystoscopy, left retrograde pyelogram and insertion of double-J stent. The stone would then be treated later on with lithotripsy.  Procedure: The patient was identified by his wrist band and proper timeout was taken.  Under general anesthesia he was prepped and draped and placed in the dorsolithotomy position. A panendoscope was inserted in the bladder. The anterior urethra is normal. The prostate gland is very large with extension into the bladder neck. It was difficult to visualize the left ureteral orifice.  I had to use a 70 lens to identify the left ureteral orifice. I was then able to catheterize the left ureteral orifice with a cone-tip catheter.   The bladder mucosa is normal. There is no stone or tumor in the bladder.  Retrograde pyelogram:  Contrast was then injected through the cone-tip catheter. The distal ureter appears normal there is a filling defect in the proximal ureter consistent with the known ureteral calculus. The calyces are moderately dilated. The cone-tip catheter was then removed.   A sensor wire was then passed through the cystoscope and the left ureter all the way up to the renal pelvis. A #6 French-24 double-J stent was then passed over the sensor wire. The  proximal curl of double-J stent is in the renal pelvis, the distal curl is in the bladder. The bladder was then emptied and the cystoscope and guidewire were removed.  As previously stated the stone will be treated with ESL.  The patient tolerated the procedure well and left the OR in satisfactory condition to postanesthesia care unit.

## 2011-04-17 NOTE — Progress Notes (Signed)
O2 sat monitor on patient . Family at bedside.

## 2011-04-17 NOTE — ED Notes (Signed)
Left flank pain, 1x4mg  zofran, 3x fentanyl to infultrated IV, fentanyl into new iv.

## 2011-04-18 ENCOUNTER — Other Ambulatory Visit: Payer: Self-pay | Admitting: Urology

## 2011-04-18 MED FILL — Mupirocin Oint 2%: CUTANEOUS | Qty: 22 | Status: AC

## 2011-04-21 ENCOUNTER — Encounter (HOSPITAL_COMMUNITY): Payer: Self-pay | Admitting: Pharmacy Technician

## 2011-04-21 NOTE — Anesthesia Postprocedure Evaluation (Signed)
Anesthesia Post Note  [name]  Procedure(s) Performed: Procedure(s) (LRB): CYSTOSCOPY WITH RETROGRADE PYELOGRAM/URETERAL STENT PLACEMENT (Left)  Anesthesia type: General  Patient location: PACU  Post pain: Pain level controlled  Post assessment: Post-op Vital signs reviewed  Last Vitals:  Filed Vitals:   04/17/11 1559  BP: 183/83  Pulse: 89  Temp:   Resp: 18    Post vital signs: Reviewed  Level of consciousness: sedated  Complications: No apparent anesthesia complications

## 2011-04-22 ENCOUNTER — Encounter (HOSPITAL_COMMUNITY): Payer: Self-pay | Admitting: *Deleted

## 2011-04-22 NOTE — Progress Notes (Signed)
Patient has not received blue folder from [facility] as of today's prelitho phone call. Instructed to come in 15 minutes early if he has not received folder in order to fill it out. Reviewed need for laxative the day before lithotripsy 04/23/11 between 3-6 PM. No aspirin or ibuprofen products. Pt needs responsible driver. He verbalizes understanding.

## 2011-04-23 NOTE — H&P (Signed)
History of Present Illness  [name] is a 77 year old who has been previously followed by Dr. Wanda Plump and Dr. Earlene Plater and has the following urologic history:  1) Urolithiasis: He has a history of calcium oxalate nephrolithiasis and has previously been treated by ESWL.  2) BPH/LUTS: Current treatment: Flomax 0.4 mg (prescribed by Dr. [name])  3) Erectile dysfunction: He has had good response to PDE-5 inhibitors but has had significant side effects including myalgias. Current treatment: PGE 10 mcg  4) Prostate cancer screening: Family history: None Last PSA: 2.36 (Feb 2011)  Interval history:  He follows up today after recently presenting with left-sided flank pain. He was found to have a 7-8 mm left proximal ureteral stone. He was evaluated by Dr. Brunilda Payor and underwent ureteral stent placement. He follows up today with a KUB x-ray and to discuss definitive stone therapy. He has continued to be somewhat nauseated although has been able to keep down liquids. He denies any fever and his pain has been relatively well controlled. He has been off aspirin since last Thursday.   Past Medical History Problems  1. History of  Diabetes Mellitus 250.00 2. History of  Hypertension 401.9  Surgical History Problems  1. History of  Cystoscopy With Insertion Of Ureteral Stent Right 2. History of  Lithotripsy  Current Meds 1. AmLODIPine Besylate 10 MG Oral Tablet; Therapy: 02Nov2010 to 2. Aspirin 325 MG Oral Tablet; TAKE 1 TABLET DAILY; Therapy: (Recorded:30Sep2011) to 3. Flomax 0.4 MG CP24; 1QD - TAKE ONE CAPSULE BY MOUTH EVERY DAY; Therapy: 08Dec2008  to (Evaluate:22Jan2011); Last Rx:22Jan2010 4. Lisinopril-Hydrochlorothiazide 20-12.5 MG Oral Tablet; Therapy: (Recorded:25Apr2008) to 5. MetFORMIN HCl 500 MG Oral Tablet; Therapy: (Recorded:25Apr2008) to 6. Prostaglandin E1 20ug/mL; INJECT AS DIRECTED AS NEEDED.  DO NOT   USE MORE THAN  EVERY OTHER DAYOR 3 TIMESPER WEEK; Therapy: 14Apr2011 to  (Evaluate:19Nov2012)   Requested for: 30Oct2012; Last Rx:30Oct2012  Allergies Medication  1. Codeine Derivatives 2. Morphine Derivatives  Family History Problems  1. Family history of  Family Health Status Number Of Children 1 son and 1 daughter  Social History Problems  1. Marital History - Currently Married 2. Never A Smoker Denied  3. History of  Alcohol Use 4. History of  Caffeine Use 5. History of  Former Smoker  Review of Systems  Genitourinary: hematuria.  Gastrointestinal: no nausea and no vomiting.  Constitutional: fever.    Vitals Vital Signs [Data Includes: Last 1 Day]  11Mar2013 04:35PM  BMI Calculated: 31.15 BSA Calculated: 2.26 Height: 6 ft  Weight: 230 lb  Blood Pressure: 175 / 90 Heart Rate: 41  Physical Exam Constitutional: Well nourished and well developed . No acute distress.  ENT:. The ears and nose are normal in appearance.  Neck: The appearance of the neck is normal and no neck mass is present.  Pulmonary: No respiratory distress and normal respiratory rhythm and effort.  Cardiovascular: Heart rate and rhythm are normal . No peripheral edema.  Abdomen: The abdomen is soft and nontender. No masses are palpated. No CVA tenderness. No hernias are palpable. No hepatosplenomegaly noted.  Lymphatics: The femoral and inguinal nodes are not enlarged or tender.  Skin: Normal skin turgor, no visible rash and no visible skin lesions.  Neuro/Psych:. Mood and affect are appropriate.    Results/Data Urine [Data Includes: Last 1 Day]   11Mar2013 COLOR YELLOW  APPEARANCE CLEAR  SPECIFIC GRAVITY 1.020  pH 6.5  GLUCOSE NEG mg/dL BILIRUBIN NEG  KETONE NEG mg/dL BLOOD LARGE  PROTEIN 100 mg/dL UROBILINOGEN 0.2 mg/dL NITRITE NEG  LEUKOCYTE ESTERASE TRACE  SQUAMOUS EPITHELIAL/HPF NONE SEEN  WBC NONE SEEN WBC/hpf RBC TNTC RBC/hpf BACTERIA NONE SEEN  CRYSTALS NONE SEEN  CASTS NONE SEEN    I independently reviewed his recent CT scan as well as his  KUB x-ray today. He has a 7-45mm stone visible on KUB imaging adjacent to his indwelling stent. He also has a large right renal calculus which does not appear to be obstructing. This measures approximately 1 cm.   Assessment Assessed  1. Nephrolithiasis 592.0  Plan Health Maintenance (V70.0)  1. UA With REFLEX  Done: 11Mar2013 03:39PM Ureteral Stone (592.1)  2. Ondansetron HCl 4 MG Oral Tablet; TAKE 1 TABLET Every 8 hours PRN; Therapy: 11Mar2013 to  (Last Rx:11Mar2013) 3. Follow-up Schedule Surgery Office  Follow-up  Done: 11Mar2013  Discussion/Summary 1. Left ureteral calculus: We have reviewed options including ureteroscopic therapy, percutaneous therapy, and shockwave lithotripsy. After reviewing the pros and cons of each approach, he does wish to proceed with shockwave lithotripsy. We have reviewed the potential risks and complications of this procedure and he does give his informed consent. He has been provided Zofran do to his ongoing nausea. He does have pain medication at home which he has been taking as needed. He will notify me should he develop fever, uncontrolled pain, or persistent nausea or vomiting.  2. Urolithiasis: We will further address his right renal calculus and his metabolic stone disease following recovery of his acute event.  3. BPH/LUTS: He will continue current therapy with tamsulosin.  4. Erectile dysfunction: He will continue current therapy with prostaglandin 10 mcg.  5. Prostate cancer screening: He is due for annual screening if he wishes to continue PSA screening. However, this should not be performed at this time since he just underwent cystoscopy and therefore will be at risk for a false-positive result.  Cc: Dr. Alroy Dust     Signatures Electronically signed by : Heloise Purpura, M.D.; Apr 21 2011  4:57PM

## 2011-04-24 ENCOUNTER — Encounter (HOSPITAL_COMMUNITY)
Admission: RE | Disposition: A | Discharge status: Home / Self Care | Payer: Self-pay | Source: Ambulatory Visit | Attending: Urology

## 2011-04-24 ENCOUNTER — Ambulatory Visit (HOSPITAL_COMMUNITY)
Admission: RE | Admit: 2011-04-24 | Discharge: 2011-04-24 | Disposition: A | Discharge status: Home / Self Care | Payer: Medicare Other | Source: Ambulatory Visit | Attending: Urology | Admitting: Urology

## 2011-04-24 ENCOUNTER — Encounter (HOSPITAL_COMMUNITY): Payer: Self-pay | Admitting: *Deleted

## 2011-04-24 ENCOUNTER — Ambulatory Visit (HOSPITAL_COMMUNITY): Payer: Medicare Other

## 2011-04-24 DIAGNOSIS — Z79899 Other long term (current) drug therapy: Secondary | ICD-10-CM | POA: Insufficient documentation

## 2011-04-24 DIAGNOSIS — N529 Male erectile dysfunction, unspecified: Secondary | ICD-10-CM | POA: Insufficient documentation

## 2011-04-24 DIAGNOSIS — N138 Other obstructive and reflux uropathy: Secondary | ICD-10-CM | POA: Insufficient documentation

## 2011-04-24 DIAGNOSIS — N201 Calculus of ureter: Secondary | ICD-10-CM | POA: Insufficient documentation

## 2011-04-24 DIAGNOSIS — I10 Essential (primary) hypertension: Secondary | ICD-10-CM | POA: Insufficient documentation

## 2011-04-24 DIAGNOSIS — N401 Enlarged prostate with lower urinary tract symptoms: Secondary | ICD-10-CM | POA: Insufficient documentation

## 2011-04-24 DIAGNOSIS — Z7982 Long term (current) use of aspirin: Secondary | ICD-10-CM | POA: Insufficient documentation

## 2011-04-24 DIAGNOSIS — E119 Type 2 diabetes mellitus without complications: Secondary | ICD-10-CM | POA: Insufficient documentation

## 2011-04-24 DIAGNOSIS — Z01818 Encounter for other preprocedural examination: Secondary | ICD-10-CM | POA: Insufficient documentation

## 2011-04-24 DIAGNOSIS — R110 Nausea: Secondary | ICD-10-CM | POA: Insufficient documentation

## 2011-04-24 LAB — GLUCOSE, CAPILLARY: Glucose-Capillary: 135 mg/dL — ABNORMAL HIGH (ref 70–99)

## 2011-04-24 SURGERY — LITHOTRIPSY, ESWL
Anesthesia: LOCAL | Laterality: Left

## 2011-04-24 MED ORDER — DIPHENHYDRAMINE HCL 25 MG PO CAPS
25.0000 mg | ORAL_CAPSULE | ORAL | Status: AC
  Administered 2011-04-24: 25 mg via ORAL

## 2011-04-24 MED ORDER — CIPROFLOXACIN HCL 500 MG PO TABS
ORAL_TABLET | ORAL | Status: AC
  Filled 2011-04-24: qty 1

## 2011-04-24 MED ORDER — DIAZEPAM 5 MG PO TABS
ORAL_TABLET | ORAL | Status: AC
  Filled 2011-04-24: qty 2

## 2011-04-24 MED ORDER — DIPHENHYDRAMINE HCL 25 MG PO CAPS
ORAL_CAPSULE | ORAL | Status: AC
  Filled 2011-04-24: qty 1

## 2011-04-24 MED ORDER — CIPROFLOXACIN HCL 500 MG PO TABS
500.0000 mg | ORAL_TABLET | ORAL | Status: AC
  Administered 2011-04-24: 500 mg via ORAL

## 2011-04-24 MED ORDER — DIAZEPAM 5 MG PO TABS
10.0000 mg | ORAL_TABLET | ORAL | Status: AC
  Administered 2011-04-24: 10 mg via ORAL

## 2011-04-24 MED ORDER — DEXTROSE-NACL 5-0.45 % IV SOLN
INTRAVENOUS | Status: DC
  Administered 2011-04-24: 16:00:00 via INTRAVENOUS

## 2011-04-24 NOTE — Op Note (Signed)
Operative note for Left ESWL in written chart.

## 2011-04-24 NOTE — Progress Notes (Signed)
Pt took his Amaryl and his Metformin this AM and has only had water to drink. His CBG on arrival to short stay was 135. D51/2 NS  At 125cc/hr started. As ordered.

## 2011-04-24 NOTE — Discharge Instructions (Signed)
Strain urine as instructed.  Do not take aspirin for 5 days after the procedure.    Call if you develop uncontrolled pain or fever.  A prescription for additional pain medication has been called to your pharmacy if you run out of your current prescription.

## 2011-04-24 NOTE — Interval H&P Note (Signed)
History and Physical Interval Note:  04/24/2011 5:36 PM  [name]  has presented today for surgery, with the diagnosis of left proximal ureteral calculus  The various methods of treatment have been discussed with the patient and family. After consideration of risks, benefits and other options for treatment, the patient has consented to  Procedure(s) (LRB): EXTRACORPOREAL SHOCK WAVE LITHOTRIPSY (ESWL) (Left) as a surgical intervention .  The patients' history has been reviewed, patient examined, no change in status, stable for surgery.  I have reviewed the patients' chart and labs.  Questions were answered to the patient's satisfaction.     Iracema Lanagan,LES

## 2011-04-24 NOTE — Progress Notes (Signed)
Pt urinated post lithotripsy. Verbalizes understanding of d/c instructions. Has strainer and collection cup. Home with daughter at 35.

## 2011-04-25 ENCOUNTER — Encounter (HOSPITAL_COMMUNITY): Payer: Self-pay

## 2011-05-07 ENCOUNTER — Encounter (HOSPITAL_COMMUNITY): Payer: Self-pay | Admitting: Urology

## 2011-05-09 ENCOUNTER — Other Ambulatory Visit: Payer: Self-pay | Admitting: Pulmonary Disease

## 2011-05-22 ENCOUNTER — Other Ambulatory Visit: Payer: Self-pay

## 2011-05-22 MED ORDER — POTASSIUM CHLORIDE CRYS ER 20 MEQ PO TBCR: 20.0000 meq | EXTENDED_RELEASE_TABLET | Freq: Every day | ORAL | Status: DC

## 2011-05-22 NOTE — Telephone Encounter (Signed)
Called and spoke with pt and he is aware that refill of the potassium has been sent to the [facility] for #90 day supply.

## 2011-05-29 ENCOUNTER — Other Ambulatory Visit: Payer: Self-pay | Admitting: Pulmonary Disease

## 2011-05-29 MED ORDER — AMLODIPINE BESYLATE 10 MG PO TABS: 10.0000 mg | ORAL_TABLET | Freq: Every day | ORAL | Status: DC

## 2011-05-29 MED ORDER — METOPROLOL TARTRATE 50 MG PO TABS: 50.0000 mg | ORAL_TABLET | Freq: Every day | ORAL | Status: DC

## 2011-05-29 NOTE — Telephone Encounter (Signed)
Pt states the the Lisinopril/HCTZ dosage is wrong. He says Dr. Daleen Squibb changed this to 40/12.5 mg and it was sent for 20/12.5 mg. I explained to the pt that if Dr. Daleen Squibb prescribed this medication and then made changes he would need to discuss this with him. Pt verbalized understanding.   He then requested 90 day supply refills on the Amlodipine and Metoprolol be sent to [facility]. Pt aware we would take care of this.

## 2011-08-12 ENCOUNTER — Other Ambulatory Visit: Payer: Self-pay | Admitting: Pulmonary Disease

## 2011-08-27 ENCOUNTER — Other Ambulatory Visit: Payer: Self-pay | Admitting: *Deleted

## 2011-08-27 MED ORDER — GLUCOSE BLOOD VI STRP: ORAL_STRIP | Status: DC

## 2011-08-27 NOTE — Telephone Encounter (Signed)
Faxed to [facility] pharmacy

## 2011-09-17 ENCOUNTER — Ambulatory Visit (INDEPENDENT_AMBULATORY_CARE_PROVIDER_SITE_OTHER): Payer: Medicare Other | Admitting: Pulmonary Disease

## 2011-09-17 ENCOUNTER — Other Ambulatory Visit (INDEPENDENT_AMBULATORY_CARE_PROVIDER_SITE_OTHER): Payer: Medicare Other

## 2011-09-17 ENCOUNTER — Encounter: Payer: Self-pay | Admitting: Pulmonary Disease

## 2011-09-17 VITALS — BP 152/88 | HR 57 | Temp 96.8°F | Ht 72.0 in | Wt 232.0 lb

## 2011-09-17 DIAGNOSIS — M1990 Unspecified osteoarthritis, unspecified site: Secondary | ICD-10-CM

## 2011-09-17 DIAGNOSIS — N401 Enlarged prostate with lower urinary tract symptoms: Secondary | ICD-10-CM

## 2011-09-17 DIAGNOSIS — I10 Essential (primary) hypertension: Secondary | ICD-10-CM

## 2011-09-17 DIAGNOSIS — E119 Type 2 diabetes mellitus without complications: Secondary | ICD-10-CM

## 2011-09-17 DIAGNOSIS — E785 Hyperlipidemia, unspecified: Secondary | ICD-10-CM

## 2011-09-17 DIAGNOSIS — F411 Generalized anxiety disorder: Secondary | ICD-10-CM

## 2011-09-17 DIAGNOSIS — K2970 Gastritis, unspecified, without bleeding: Secondary | ICD-10-CM

## 2011-09-17 DIAGNOSIS — D126 Benign neoplasm of colon, unspecified: Secondary | ICD-10-CM

## 2011-09-17 DIAGNOSIS — N200 Calculus of kidney: Secondary | ICD-10-CM

## 2011-09-17 DIAGNOSIS — I2510 Atherosclerotic heart disease of native coronary artery without angina pectoris: Secondary | ICD-10-CM

## 2011-09-17 DIAGNOSIS — K5730 Diverticulosis of large intestine without perforation or abscess without bleeding: Secondary | ICD-10-CM

## 2011-09-17 LAB — BASIC METABOLIC PANEL
BUN: 22 mg/dL (ref 6–23)
Chloride: 103 mEq/L (ref 96–112)
Potassium: 4.2 mEq/L (ref 3.5–5.1)

## 2011-09-17 LAB — HEMOGLOBIN A1C: Hgb A1c MFr Bld: 7.6 % — ABNORMAL HIGH (ref 4.6–6.5)

## 2011-09-17 MED ORDER — METOPROLOL TARTRATE 50 MG PO TABS: 50.0000 mg | ORAL_TABLET | Freq: Two times a day (BID) | ORAL | Status: DC

## 2011-09-17 NOTE — Patient Instructions (Addendum)
Today we updated your med list in our EPIC system...    Continue your current medications the same...  We corrected the Metoprolol tartrate dose> it should be 50mg  TWICE daily...  Today we did your follow up Diabetic labs...    We will mail you a copy of the report when avail...  If you are NOT going to eat breakfast or lunch any given day>     Then only take 1/2 of the Glimepiride tab that AM...  Call for any questions...  Let's continue our 6 month follow up visits.Marland KitchenMarland Kitchen

## 2011-09-17 NOTE — Progress Notes (Signed)
Subjective:    Patient ID: [name], male    DOB: 1934/11/03, 77 y.o.   MRN: [id number]  HPI 77 y/o WM here for a follow up visit... he has multiple medical problems as noted below... Followed for general medical purposes w/ HBP, CAD, Hyperlipidemia, DM, DJD, etc... also sees DrWall for Cards, DrPerry for GI (Gastritis, Divertics, Polyps), & DrBorden for Urology (Hx Kidney stones & BPH)...   ~  March 15, 2010:  He was hosp 9/11 by [facility] w/ atypCP & seen by Walker Kehr- no further testing felt nec;  he gave them a very old med list he had in his wallet & they didn't check [employer] EMR- so Metoprolol was not given or included in his disch med rec;  BP has been running high 150-160+ & this is the 1st medical f/u since then> rec to re-start the METOPROLOL 50mg Bid.    He saw Urology, DrBorden 11/11 for f/u BPH, hx kid stones, ED & prostate cancer screening> he passed an 8mm distal right ureteral stone & has bilat nonobstructing stones in kidneys, stable on Flomax & prostaglandin injections Prn for ED.    Currently feeling well- only c/o knee arthritis as before L worse than R & holding off on TKRs "I want to keep them a while longer";  states chest OK w/o CP, palp, SOB, edema;  controls Lipids w/ diet but weight up 10# & not checking BS at home;  wants 90d refills of all meds.  ~  September 16, 2010:  82mo ROV> generally feeling well, hx severe DJD in kness as noted, "I get plenty of exercise", had shot in knee recently by [facility], takes Aleve prn...    He saw DrBorden 3/12 for f/u stones, BPH w/ LUTS, ED & PSA screening> doing well on Flomax, PSA= 2.0, he wanted to do 24H urine but pt declined.    Noted small lump in ant neck 7/12 & saw TP; Thyroid Sonar showed 3 sm (<68mm) thyr cysts- the palp lesion is in left SCM muscle w/ benign features; we will follow clinically- no changes noted.    LABS 7/12 reviewed: BS=142 (A1c today= 8.4); other Chems/ CBC/ TFTs> all wnl; REC- add GLIMEPIRIDE 1mg /d w/ A1c  continuing to rise...  ~  March 19, 2011:  82mo ROV & he reports a good interval, notes that his home BS checks have all been good, brother adm to [facility] w/ Alz...    <HBP> controlled on Metop50Bid, Amlod10, LisinHCT 20-12.5, & K20/d; BP= 134/82 & he denies CP, palpit, dizzy, SOB, edema, etc...    <CAD> known non-obstructive CAD followed by DrWall/Nishan & doing well w/o CP or other symptoms...    <Hyperlipid> on diet alone and FLP today shows TChol 182, TG 178, HDL 34, LDL 112; needs better low fat diet & wt reduction...    <DM> on Metform500Bid + Glimep1mg /d; states BS at home all 100-110 but FBS today= 160 & A1c=7.8; may need incr dose, for now get wt down!    <GI- Gastritis, Divertics, Polyps> stable & doing well on Prilosec prn & overdue for colon screen & he will call...    <GU- BPH, KidStones> on Flomax & doing well w/o signif urinary symptoms...    <DJD> followed by Vinetta Bergamo on OTC meds andhe notes shots help some...  ~  September 17, 2011:  82mo ROV & [place] has had a time w/ kidney stones> went to ER 3/13 w/ left flank pain, found to have obstructive uropathy, seen by  DrNesi/ Borden- cysto, stent placed, lithotripsy, then stent removed; stone analysis was calcium oxalate; voiding satis on Flomax & they discussed dietary adjustments; he has stable stone in right renal pelvis being followed...     BP controlled on Metop, Amlodip, Lisin/Hct; BP= 152/88 today & he denies CP, palpit, SOB, edema, etc...     Chol has been fair on diet alone & he has repeatedly refused med rx...    DM w/ fair regulation on Metform+Glimep but he c/o low sugars despite A1c in the mid 7's; see labs below...    He has DJD but managing satis on OTC analgesics... We reviewed prob list, meds, xrays and labs> see below for updates >> LABS 8/13:  Chems- ok x BS=163;  A1c=7.6           Problem List:  HEARING LOSS - he also sees DrShoemaker w/ hearing eval 7/09 & new hearing aides...  HYPERTENSION (ICD-401.9) - controlled on  METOPROLOL 50mg Bid, LISINOPRIL/HCT 20-12.5, KCl 20/d & NORVASC 10mg /d...   ~  3/10:  Metoprolol 50mg  Bid added to his regimen. ~  2/12:  he was using an OLD MED LIST (which we discarded) & we reviewed his 4 med regimen & asked him to monitor BP at home. ~  2/13:  BP= 134/82> he knows to follow a low salt diet and needs to lose wt; he is asymtomatic- denies HA, fatigue, visual changes, CP, palipit, dizziness, syncope, dyspnea, edema, etc... ~  CXR 3/13 showed cardiomeg, lower lung vols, clear/ NAD.Marland Kitchen. ~  8/13:  BP= 152/88 & needs to take meds regularly, get wt down...  CAD (ICD-414.00) - Hx non-obstructive disease on cath 6/04 w/ 50%3rdDiag branch of LAD, 40%CIRC, 30%distalRCA... he takes ASA 81mg  daily and practicing secondary risk factor reduction strategy...  currently w/o CP, palpit, change in breathing, etc... last seen by DrWall 4/10> stable, no changes made. ~  3/10:  Myoview showed mild global HK, EF= 47%, apic thinning vs sm infarct & ?mild ischemia. ~  7/11:  no CP, palpit, SOB, etc... he is active- walking, yard, etc... ~  9/11:  adm by [facility] w/ atypCP, seen by Walker Kehr & no further testing felt necessary.  HYPERLIPIDEMIA, BORDERLINE (ICD-272.4) - on diet alone... ~  FLP 12/07 showing TChol 157, TG 137, HDL 35, LDL 95 ~  FLP 1/09 showed TChol 170, TG 144, HDL 32, LDL 109 ~  FLP 7/09 (wt= 233#) showed TChol 158, TG 162, HDL 31, LDL 95 ~  FLP in hosp 3/10 showed TChol 154, TG 177, HDL 25, LDL 94... he doesn't want Fibrate Rx. ~  FLP 7/10 showed TChol 160, TG 162, HDL 30, LDL 98... still refuses med Rx... ~  FLP 1/11 showed TChol 174, TG 170, HDL 36, LDL 104 ~  FLP 7/11 showed TChol 161, TG 192, HDL 34, LDL 88 ~  FLP 2/12 showed TChol 166, TG 273, HDL 31, LDL 103... worse TG- may need Fibrate if wt not coming down! ~  FLP 2/13 on diet alone showed TChol 182, TG 178, HDL 34, LDL 112... He refuses med rx.  AODM (ICD-250.00) - on METFORMIN 500mg Bid + GLIMEPIRIDE 1mg /d (added 8/12); discussed  weight reduction strategies... ~  in 2008 BS=127, & HgA1c=6.5 on Metformin 500Bid... pt decr to 1/d on his own. ~  labs 1/09 showed BS= 143, HgA1c= 6.8.Marland Kitchen. ~  labs 7/09 (wt= 233#) showed BS= 136, HgA1c= 6.9.Marland KitchenMarland Kitchen rec- OK on Metform500/d, needs better diet. ~  labs 2/10 (wt= 242#) showed BS= 171, HgA1c=  6.9... rec> get wt down. ~  labs 7/10 (wt= 240#) showed BS= 135, A1c= 7.0.Marland KitchenMarland Kitchen may need additional med. ~  labs 1/11 (wt=245#) showed BS= 149, A1c= 7.1 ~  labs 7/11 (wt= 226#) showed BS= 146, A1c= 7.3 ~  labs 2/12 (wt=236#) showed BS= 151, A1c= 7.7.Marland Kitchen. rec> incr METFORM500Bid & get wt down. ~  Labs 8/12 (wt=232#) showed BS= 142, A1c= 8.4... Rec> add GLIMEPIRIDE 1mg /d. ~  Labs 2/13 (wt=241#) showed BS=160, A1c= 7.8.Marland KitchenMarland Kitchen Must get wt down or incr meds... ~  Labs 8/13 (wt=232#) showed BS=163, A1c= 7.6.Marland KitchenMarland Kitchen Continue same.  Hx of GASTRITIS (ICD-535.50) - on PRILOSEC 20mg  OTC Prn... ~  EGD 2/09 by DrDBrodie showed 2cmHH, mod esophagitis, duodenitis... Rx'd ProtonixBid.  DIVERTICULOSIS OF COLON (ICD-562.10) >> COLONIC POLYPS (ICD-211.3) >> ~  Colonoscopy 9/05 by DrPerry showed divertics & diminutive polyp... f/u planned 5 years. ~  Colonoscopy 4/11 showed mod divertics, evid of prior sigmoid resection (?- we don't have hx of this), int hems... ~  CT Abd 3/13> acute obstructive uropathy w/ left ureteral stone; another stone in right renal pelvis, 22mm hyperdense left upper pole renal lesion ?hemorrhagic cyst, enlarged prostate, hepatic steatosis, diverticulosis...  HYPERTROPHY PROSTATE W/UR OBST & OTH LUTS (ICD-600.01) RENAL CALCULUS (ICD-592.0) - on FLOMAX 0.4mg /d per Urology & followed for BPH, Urolithiasis, ED & PSA screening purposes. ~  he has prev required cystoscopy w/ stents, & prev had lithotripsy as well...  ~  labs 7/10 showed PSA= 2.09, & subseq labs by DrRDavis (we don't have his notes). ~  11/11:  seen by DrBorden & passed an 8mm right ureteral stone, XRays show bilat nonobstructing stones in  kidneys. ~  3/12:  Continued f/u w/ DrBorden & PSA= 2.0 ~  2/13:  PSA= 2.57 & he is voiding satis ~  3/13:  ER 3/13 w/ left flank pain, found to have obstructive uropathy, seen by DrNesi/ Laverle Patter- cysto, stent placed, lithotripsy, then stent removed; stone analysis was calcium oxalate; voiding satis on Flomax & they discussed dietary adjustments; he has stable stone in right renal pelvis being followed...   Hx of PYELONEPHRITIS (ICD-590.80) - Adm 4/08 w/ pyelo from obstructing R kidney stone requiring cysto, stent, laser fragmentation...  DEGENERATIVE JOINT DISEASE (ICD-715.90) - he uses OTC meds w/ relief... followed by Sherin Quarry w/ "endstage" DJD in knees (left worse than right) & told he needs TKRs- he is determined to "keep them a little bit longer"...  ANXIETY (ICD-300.00) - his wife passed away 2005-07-28 & he has been enjoying fishing, hunting, and taking care of his 19 y/o grandson.   Past Surgical History  Procedure Date  . Appendectomy   . Cataract extraction   . Knee surgery     bilateral  . Shoulder surgery 04/2005    left by Dr. Luiz Blare  . S/p elap 07-28-84 w/excision of leiomyoma @ ge junction, meckle's divertic resected, & cholecystectomy   . S/p cysto & stents for kidnedy stone 11/08 by dr. Wanda Plump   . Cystoscopy 04/17/11    stent placed  . Cystoscopy w/ ureteral stent placement 04/17/2011    Procedure: CYSTOSCOPY WITH RETROGRADE PYELOGRAM/URETERAL STENT PLACEMENT;  Surgeon: Lindaann Slough, MD;  Location: [facility];  Service: Urology;  Laterality: Left;    Outpatient Encounter Prescriptions as of 09/17/2011  Medication Sig Dispense Refill  . amLODipine (NORVASC) 10 MG tablet Take 1 tablet (10 mg total) by mouth daily before breakfast.  90 tablet  3  . glimepiride (AMARYL) 1 MG tablet TAKE ONE TABLET BY MOUTH  IN THE MORNING  90 tablet  3  . glucose blood (ONE TOUCH ULTRA TEST) test strip Check Blood Sugars daily or as directed  DX CODE: 250.00  100 each  5  . potassium chloride SA  (K-DUR,KLOR-CON) 20 MEQ tablet Take 1 tablet (20 mEq total) by mouth daily.  90 tablet  3  . Tamsulosin HCl (FLOMAX) 0.4 MG CAPS Take 1 capsule (0.4 mg total) by mouth daily.  90 capsule  3  . VIAGRA 100 MG tablet USE AS DIRECTED  10 each  6  . lisinopril-hydrochlorothiazide (PRINZIDE,ZESTORETIC) 20-12.5 MG per tablet Take 1 tablet by mouth daily before breakfast.      . metFORMIN (GLUCOPHAGE) 500 MG tablet Take 1 tablet (500 mg total) by mouth 2 (two) times daily with a meal.  180 tablet  3  . metoprolol (LOPRESSOR) 50 MG tablet Take 1 tablet (50 mg total) by mouth daily before breakfast.  90 tablet  3    Allergies  Allergen Reactions  . Codeine Phosphate     REACTION: nausea  . Morphine     REACTION: nausea    Current Medications, Allergies, Past Medical History, Past Surgical History, Family History, and Social History were reviewed in [employer] record.    Review of Systems    See HPI - all other systems neg except as noted...  The patient complains of decreased hearing, dyspnea on exertion, and difficulty walking.  The patient denies anorexia, fever, weight loss, weight gain, vision loss, hoarseness, chest pain, syncope, peripheral edema, prolonged cough, headaches, hemoptysis, abdominal pain, melena, hematochezia, severe indigestion/heartburn, hematuria, incontinence, muscle weakness, suspicious skin lesions, transient blindness, depression, unusual weight change, abnormal bleeding, enlarged lymph nodes, and angioedema.     Objective:   Physical Exam    WD, Overweight, 77 y/o WM in NAD... GENERAL:  Alert & oriented; pleasant & cooperative... HEENT:  [place]/AT, EOM-wnl, PERRLA, EACs-clear, TMs-wnl, NOSE-clear, THROAT-clear & wnl. NECK:  Supple w/ fairROM; no JVD; normal carotid impulses w/o bruits; no thyromegaly or nodules palpated; no lymphadenopathy. CHEST:  Clear to P & A; without wheezes/ rales/ or rhonchi. HEART:  Regular Rhythm; without murmurs/ rubs/  or gallops. ABDOMEN:  Soft & nontender; normal bowel sounds; no organomegaly or masses detected. EXT:  mod arthritic changes; no varicose veins/ +venous insuffic/ or edema. NEURO:  CN's intact;  no focal neuro deficits... DERM:  No lesions noted; no rash etc...  RADIOLOGY DATA:  Reviewed in the EPIC EMR & discussed w/ the patient...  LABORATORY DATA:  Reviewed in the EPIC EMR & discussed w/ the patient...   Assessment & Plan:   HBP>  BP controlled on 4 meds as listed, encouraged to take regularly esp in view of his DM, low salt diet too...  CAD>  Continue ASA & secondary risk factor reduction strategy; he has seen DrNishan & DrWall in the past...  HYPERLIPIDEMIA>  On diet alone but wt too hi & TG too hi; needs better low carb, low fat wt reducing diet efforts...  DM>  On 2 meds now & A1c improved but not at goal; may need to increase meds but would prefer wt reduction etc...  GI> Gastritis, Divertics, Polyps>  He uses Prilosec prn; had screening colon f/u 4/11 & neg w/o polyps etc...  GU> Kidney Stones, BPH w/ LTOS, ED>  Followed by DrBorden & his note reviewed; on Flomax, uses prostaglandin shots for ED prn; PSA=2.57  DJD>  He had left knee shot from DrRowan recently; uses  Aleve prn...   Patient's Medications  New Prescriptions   No medications on file  Previous Medications   AMLODIPINE (NORVASC) 10 MG TABLET    Take 1 tablet (10 mg total) by mouth daily before breakfast.   GLIMEPIRIDE (AMARYL) 1 MG TABLET    TAKE ONE TABLET BY MOUTH IN THE MORNING   GLUCOSE BLOOD (ONE TOUCH ULTRA TEST) TEST STRIP    Check Blood Sugars daily or as directed  DX CODE: 250.00   LISINOPRIL-HYDROCHLOROTHIAZIDE (PRINZIDE,ZESTORETIC) 20-12.5 MG PER TABLET    Take 1 tablet by mouth daily before breakfast.   METFORMIN (GLUCOPHAGE) 500 MG TABLET    Take 1 tablet (500 mg total) by mouth 2 (two) times daily with a meal.   METOPROLOL (LOPRESSOR) 50 MG TABLET    Take 1 tablet (50 mg total) by mouth daily  before breakfast.   POTASSIUM CHLORIDE SA (K-DUR,KLOR-CON) 20 MEQ TABLET    Take 1 tablet (20 mEq total) by mouth daily.   TAMSULOSIN HCL (FLOMAX) 0.4 MG CAPS    Take 1 capsule (0.4 mg total) by mouth daily.   VIAGRA 100 MG TABLET    USE AS DIRECTED  Modified Medications   No medications on file  Discontinued Medications   No medications on file

## 2011-09-19 ENCOUNTER — Telehealth: Payer: Self-pay | Admitting: Pulmonary Disease

## 2011-09-19 NOTE — Telephone Encounter (Signed)
Called and spoke with pt and he is aware of lab results per SN.  Pt requested a copy of these labs be mailed to him.  This has been done and pt is aware.  Nothing further is needed.

## 2011-10-09 ENCOUNTER — Telehealth: Payer: Self-pay | Admitting: Pulmonary Disease

## 2011-10-09 ENCOUNTER — Ambulatory Visit (INDEPENDENT_AMBULATORY_CARE_PROVIDER_SITE_OTHER): Payer: Medicare Other | Admitting: Emergency Medicine

## 2011-10-09 VITALS — BP 160/87 | HR 97 | Temp 98.1°F | Resp 16 | Ht 69.0 in | Wt 233.0 lb

## 2011-10-09 DIAGNOSIS — S91012A Laceration without foreign body, left ankle, initial encounter: Secondary | ICD-10-CM

## 2011-10-09 DIAGNOSIS — Z23 Encounter for immunization: Secondary | ICD-10-CM

## 2011-10-09 DIAGNOSIS — M79609 Pain in unspecified limb: Secondary | ICD-10-CM

## 2011-10-09 DIAGNOSIS — S81009A Unspecified open wound, unspecified knee, initial encounter: Secondary | ICD-10-CM

## 2011-10-09 MED ORDER — SULFAMETHOXAZOLE-TRIMETHOPRIM 800-160 MG PO TABS: 1.0000 | ORAL_TABLET | Freq: Two times a day (BID) | ORAL | Status: AC

## 2011-10-09 NOTE — Telephone Encounter (Signed)
I spoke with leigh regarding this. She will check with SN. Will route to her box

## 2011-10-09 NOTE — Telephone Encounter (Signed)
Pt is aware that SN would be happy to see him but we could not add him on until all the pts on the schedule have been seen.  Pt stated to [place] that he would just go home and she did tell him that he should try the urgent care.  Pt voiced his understanding of this and did not want to stay.

## 2011-10-12 NOTE — Progress Notes (Signed)
Date:  10/09/2011   Name:  [name]   DOB:  06-Feb-1935   MRN:  [id number] Gender: male Age: 77 y.o.  PCP:  No primary provider on file.    Chief Complaint: Cellulitis   History of Present Illness:  [name] is a 77 y.o. pleasant patient who presents with the following:  No recollection of injury has an avulsion of left lateral ankle.  Family concerned that it is taking a long time to heal.  No infection.    Patient Active Problem List  Diagnosis  . COLONIC POLYPS  . AODM  . HYPERLIPIDEMIA, BORDERLINE  . ANXIETY  . HYPERTENSION  . CAD  . GASTRITIS  . DIVERTICULOSIS OF COLON  . PYELONEPHRITIS  . RENAL CALCULUS  . DEGENERATIVE JOINT DISEASE  . SYNCOPE, HX OF  . HYPERTROPHY PROSTATE W/UR OBST & OTH LUTS  . Swelling, mass, or lump in head and neck  . Thyroid cyst    Past Medical History  Diagnosis Date  . CAD (coronary artery disease)   . History of syncope   . HTN (hypertension)   . Borderline hyperlipidemia   . History of gastritis   . Diverticulosis of colon   . Colon polyps   . Hypertrophy of prostate with urinary obstruction and other lower urinary tract symptoms (LUTS)   . Renal calculus   . History of pyelonephritis   . DJD (degenerative joint disease)   . Anxiety   . DM (diabetes mellitus)     Adult onset    Past Surgical History  Procedure Date  . Appendectomy   . Cataract extraction   . Knee surgery     bilateral  . Shoulder surgery 04/2005    left by Dr. Luiz Blare  . 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 S/p cysto & stents for kidnedy stone 11/08 by dr. Wanda Plump   . Cystoscopy 04/17/11    stent placed  . Cystoscopy w/ ureteral stent placement 04/17/2011    Procedure: CYSTOSCOPY WITH RETROGRADE PYELOGRAM/URETERAL STENT PLACEMENT;  Surgeon: Lindaann Slough, MD;  Location: [facility];  Service: Urology;  Laterality: Left;    History  Substance Use Topics  . Smoking  status: Never Smoker   . Smokeless tobacco: Not on file  . Alcohol Use: No    Family History  Problem Relation Age of Onset  . Heart attack Mother   . Heart attack Mother     Allergies  Allergen Reactions  . Codeine Phosphate     REACTION: nausea  . Morphine     REACTION: nausea    Medication list has been reviewed and updated.  Current Outpatient Prescriptions on File Prior to Visit  Medication Sig Dispense Refill  . amLODipine (NORVASC) 10 MG tablet Take 1 tablet (10 mg total) by mouth daily before breakfast.  90 tablet  3  . glimepiride (AMARYL) 1 MG tablet TAKE ONE TABLET BY MOUTH IN THE MORNING  90 tablet  3  . glucose blood (ONE TOUCH ULTRA TEST) test strip Check Blood Sugars daily or as directed  DX CODE: 250.00  100 each  5  . lisinopril-hydrochlorothiazide (PRINZIDE,ZESTORETIC) 20-12.5 MG per tablet Take 1 tablet by mouth daily before breakfast.      . metFORMIN (GLUCOPHAGE) 500 MG tablet Take 1 tablet (500 mg total) by mouth 2 (two) times daily with a meal.  180 tablet  3  . metoprolol (LOPRESSOR) 50 MG tablet Take  1 tablet (50 mg total) by mouth 2 (two) times daily.  180 tablet  3  . potassium chloride SA (K-DUR,KLOR-CON) 20 MEQ tablet Take 1 tablet (20 mEq total) by mouth daily.  90 tablet  3  . Tamsulosin HCl (FLOMAX) 0.4 MG CAPS Take 1 capsule (0.4 mg total) by mouth daily.  90 capsule  3  . VIAGRA 100 MG tablet USE AS DIRECTED  10 each  6    Review of Systems:  As per HPI, otherwise negative.    Physical Examination: Filed Vitals:   10/09/11 1805  BP: 160/87  Pulse: 97  Temp: 98.1 F (36.7 C)  Resp: 16   Filed Vitals:   10/09/11 1805  Height: 5\' 9"  (1.753 m)  Weight: 233 lb (105.688 kg)   Body mass index is 34.41 kg/(m^2). Ideal Body Weight: Weight in (lb) to have BMI = 25: 168.9    GEN: WDWN, NAD, Non-toxic, Alert & Oriented x 3 HEENT: Atraumatic, Normocephalic.  Ears and Nose: No external deformity. EXTR: No  clubbing/cyanosis/edema NEURO: Normal gait.  PSYCH: Normally interactive. Conversant. Not depressed or anxious appearing.  Calm demeanor.  Skin:  Avulsion skin on lateral left ankle with minimal erythema surrounding.    Assessment and Plan: No wound care required other than a dressing. Continue topical antibiotic folllow up as needed  Carmelina Dane, MD

## 2011-10-27 ENCOUNTER — Telehealth: Payer: Self-pay | Admitting: Pulmonary Disease

## 2011-10-27 MED ORDER — METOPROLOL TARTRATE 50 MG PO TABS: 50.0000 mg | ORAL_TABLET | Freq: Two times a day (BID) | ORAL | Status: DC

## 2011-10-27 NOTE — Telephone Encounter (Signed)
Pt needs refill on metoprolol. Refill sent. Pt is aware.[name], CMA

## 2011-10-29 ENCOUNTER — Other Ambulatory Visit: Payer: Self-pay | Admitting: *Deleted

## 2011-10-29 MED ORDER — METOPROLOL TARTRATE 50 MG PO TABS: 50.0000 mg | ORAL_TABLET | Freq: Two times a day (BID) | ORAL | Status: DC

## 2011-10-29 MED ORDER — AMLODIPINE BESYLATE 10 MG PO TABS: 10.0000 mg | ORAL_TABLET | Freq: Every day | ORAL | Status: DC

## 2011-11-24 ENCOUNTER — Ambulatory Visit (INDEPENDENT_AMBULATORY_CARE_PROVIDER_SITE_OTHER): Payer: Medicare Other | Admitting: Family Medicine

## 2011-11-24 VITALS — Temp 97.9°F

## 2011-11-24 DIAGNOSIS — Z23 Encounter for immunization: Secondary | ICD-10-CM

## 2012-02-18 ENCOUNTER — Emergency Department (HOSPITAL_COMMUNITY)
Admission: EM | Admit: 2012-02-18 | Discharge: 2012-02-18 | Disposition: A | Discharge status: Home / Self Care | Payer: Medicare Other | Attending: Emergency Medicine | Admitting: Emergency Medicine

## 2012-02-18 ENCOUNTER — Emergency Department (HOSPITAL_COMMUNITY): Payer: Medicare Other

## 2012-02-18 ENCOUNTER — Encounter (HOSPITAL_COMMUNITY): Payer: Self-pay | Admitting: Emergency Medicine

## 2012-02-18 DIAGNOSIS — E119 Type 2 diabetes mellitus without complications: Secondary | ICD-10-CM | POA: Insufficient documentation

## 2012-02-18 DIAGNOSIS — Z860100 Personal history of colon polyps, unspecified: Secondary | ICD-10-CM | POA: Insufficient documentation

## 2012-02-18 DIAGNOSIS — I10 Essential (primary) hypertension: Secondary | ICD-10-CM | POA: Insufficient documentation

## 2012-02-18 DIAGNOSIS — M79609 Pain in unspecified limb: Secondary | ICD-10-CM | POA: Insufficient documentation

## 2012-02-18 DIAGNOSIS — Z87448 Personal history of other diseases of urinary system: Secondary | ICD-10-CM | POA: Insufficient documentation

## 2012-02-18 DIAGNOSIS — F411 Generalized anxiety disorder: Secondary | ICD-10-CM | POA: Insufficient documentation

## 2012-02-18 DIAGNOSIS — I2510 Atherosclerotic heart disease of native coronary artery without angina pectoris: Secondary | ICD-10-CM | POA: Insufficient documentation

## 2012-02-18 DIAGNOSIS — R110 Nausea: Secondary | ICD-10-CM | POA: Insufficient documentation

## 2012-02-18 DIAGNOSIS — Z8739 Personal history of other diseases of the musculoskeletal system and connective tissue: Secondary | ICD-10-CM | POA: Insufficient documentation

## 2012-02-18 DIAGNOSIS — Z87442 Personal history of urinary calculi: Secondary | ICD-10-CM | POA: Insufficient documentation

## 2012-02-18 DIAGNOSIS — Z8669 Personal history of other diseases of the nervous system and sense organs: Secondary | ICD-10-CM | POA: Insufficient documentation

## 2012-02-18 DIAGNOSIS — Z8711 Personal history of peptic ulcer disease: Secondary | ICD-10-CM | POA: Insufficient documentation

## 2012-02-18 DIAGNOSIS — E785 Hyperlipidemia, unspecified: Secondary | ICD-10-CM | POA: Insufficient documentation

## 2012-02-18 DIAGNOSIS — Z79899 Other long term (current) drug therapy: Secondary | ICD-10-CM | POA: Insufficient documentation

## 2012-02-18 DIAGNOSIS — Z8719 Personal history of other diseases of the digestive system: Secondary | ICD-10-CM | POA: Insufficient documentation

## 2012-02-18 DIAGNOSIS — M79602 Pain in left arm: Secondary | ICD-10-CM

## 2012-02-18 LAB — CBC WITH DIFFERENTIAL/PLATELET
HCT: 47.2 % (ref 39.0–52.0)
Hemoglobin: 16.5 g/dL (ref 13.0–17.0)
Lymphocytes Relative: 27 % (ref 12–46)
MCHC: 35 g/dL (ref 30.0–36.0)
Monocytes Absolute: 0.5 10*3/uL (ref 0.1–1.0)
Monocytes Relative: 9 % (ref 3–12)
Neutro Abs: 3.2 10*3/uL (ref 1.7–7.7)
WBC: 5.3 10*3/uL (ref 4.0–10.5)

## 2012-02-18 LAB — POCT I-STAT TROPONIN I
Troponin i, poc: 0.01 ng/mL (ref 0.00–0.08)
Troponin i, poc: 0.01 ng/mL (ref 0.00–0.08)
Troponin i, poc: 0 ng/mL (ref 0.00–0.08)

## 2012-02-18 LAB — POCT I-STAT, CHEM 8
BUN: 16 mg/dL (ref 6–23)
Calcium, Ion: 1.14 mmol/L (ref 1.13–1.30)
Chloride: 103 mEq/L (ref 96–112)
HCT: 48 % (ref 39.0–52.0)
Sodium: 138 mEq/L (ref 135–145)

## 2012-02-18 LAB — URINALYSIS, ROUTINE W REFLEX MICROSCOPIC
Bilirubin Urine: NEGATIVE
Nitrite: NEGATIVE
Protein, ur: NEGATIVE mg/dL
Urobilinogen, UA: 0.2 mg/dL (ref 0.0–1.0)

## 2012-02-18 LAB — TROPONIN I: Troponin I: <0.3 ng/mL (ref <0.30)

## 2012-02-18 LAB — URINE MICROSCOPIC-ADD ON

## 2012-02-18 MED ORDER — MORPHINE SULFATE 4 MG/ML IJ SOLN
4.0000 mg | Freq: Once | INTRAMUSCULAR | Status: AC
  Administered 2012-02-18: 4 mg via INTRAVENOUS
  Filled 2012-02-18: qty 1

## 2012-02-18 MED ORDER — HYDROCODONE-ACETAMINOPHEN 5-325 MG PO TABS: 2.0000 | ORAL_TABLET | Freq: Four times a day (QID) | ORAL | Status: DC | PRN

## 2012-02-18 MED ORDER — HYDROCODONE-ACETAMINOPHEN 5-325 MG PO TABS
2.0000 | ORAL_TABLET | Freq: Once | ORAL | Status: AC
  Administered 2012-02-18: 2 via ORAL
  Filled 2012-02-18: qty 2

## 2012-02-18 MED ORDER — ONDANSETRON HCL 4 MG/2ML IJ SOLN
4.0000 mg | Freq: Once | INTRAMUSCULAR | Status: AC
  Administered 2012-02-18: 4 mg via INTRAVENOUS
  Filled 2012-02-18: qty 2

## 2012-02-18 NOTE — ED Notes (Signed)
Cardiology at bedside.

## 2012-02-18 NOTE — ED Notes (Signed)
Pt denies nausea currently; pt denies dizziness; pt denies numbness and tingling. Pt mentating appropriately.

## 2012-02-18 NOTE — ED Notes (Signed)
Pt ambulatory leaving ED with family. Pt instructed on appointment and follow up care for stress test scheduled tomorrow. Pt verbalized understanding of instructions and has no further questions upon d/c. Pt does not appear to be in acute distress upon d/c.

## 2012-02-18 NOTE — ED Notes (Signed)
Dr. Jacubowitz at bedside 

## 2012-02-18 NOTE — ED Notes (Signed)
Pt denies chest pain; c/o pain in left shoulder and into left arm; pt c/o nausea. Pt denies numbness and tingling; Pt c/o dizziness. Pt mentating appropriately.

## 2012-02-18 NOTE — H&P (Addendum)
Consult Note  Patient ID: [name] MRN: [id number], SOB: 1934-12-25 78 y.o. Date of Encounter: 02/18/2012, 1:21 PM  Primary Physician: Dr. [name] Primary Cardiologist: Dr. Daleen Squibb  Chief Complaint: left arm/shoulder pain and nausea  HPI: 78 y.o. male w/ PMHx significant for nonobstructive CAD (cath '04 50-60% D3, 40-50% OM3, 30%-40 distal RCA), HLD, HTN, DMII, and anxiety who presented to [facility] on 02/18/2012 with complaints of left arm/shoulder pain and nausea.  Cath 2004 showed nonobstructive CAD  50-60% D3, 40-50% OM3, 30%-40 distal RCA. Myoview 04/2008 was low-risk with apical thinning versus small prior apical infarct and mild apical ischemia. Echo EF 50-55%, mild hypokinesis of the distal anterior wall. He was evaluated in 2011 with neck/bilat arm pain at which time he ruled out for MI and was instructed to follow up with Dr. Daleen Squibb for possible myoview, but he has not seen cardiology since that time. He had left shoulder surgery in 2007.  He reports feeling well until waking up this morning around 6am with left shoulder and arm pain that has been constant and was associated with nausea. No chest pain, sob, or diaphoresis. He took an ASA without relief and was worried it was coming from his heart so he presented for evaluation. He has been able to walk and "dig ditches in the yard" without shortness of breath, chest or arm pain. Denies fever, chills, cough, orthopnea, edema, or syncope. He received SL NTG by EMS without relief of pain.  In the ED, EKG shows NSR with inferior Q waves, unchanged from prior EKG. CXR shows borderline cardiomegaly and vascular congestion, but otherwise no acute abnormalities. Labs are significant for normal poc troponin, glucose 306, unremarkable CBC, BMET. He continues to have mild left shoulder/arm dull pain. He appears very anxious.   Past Medical History  Diagnosis Date  . CAD (coronary artery disease)   . History of syncope   . HTN  (hypertension)   . Borderline hyperlipidemia   . History of gastritis   . Diverticulosis of colon   . Colon polyps   . Hypertrophy of prostate with urinary obstruction and other lower urinary tract symptoms (LUTS)   . Renal calculus   . History of pyelonephritis   . DJD (degenerative joint disease)   . Anxiety   . DM (diabetes mellitus)     Adult onset    2004 Cardiac Cath HEMODYNAMIC DATA:  1. Central aorta 151/80, mean 109.  2. Left ventricle 149/3/15.  3. No gradient on pullback across the aortic valve.  ANGIOGRAPHIC DATA:  1. Ventriculography was performed in the RAO projection. Overall ejection fraction would be estimated at 50-55%. There is some abnormal motion of the distal anterolateral wall, although it is not clearly akinetic. It appears somewhat dyssynergic.  2. The left main coronary artery is free of critical disease.  3. The left anterior descending courses to the apex. There are three major diagonal branches. The LAD after the third diagonal branch has some mild systolic milking suggestive of mild myocardial __________. The remainder of the LAD, however, is without critical disease. The first large diagonal branch is free of disease. The second fairly large diagonal branch is also free of disease. The third diagonal branch which is moderate size has a segmental plaque just after its takeoff with about 50-60%.  4. The circumflex artery provides two minor marginal branches proximally which are small and insignificant. The third marginal branch is a fairly large vessel that has probably 40 to at  most 50% plaquing right at the ostium. Distally, the AV circumflex provides several other small minor branches and terminates as a small posterior descending branch with about 40-50% narrowing in its proximal portion.  5. The right coronary artery is a very large caliber vessel providing an acute marginal branch, a posterior descending branch, and a large posterolateral branch. There is  perhaps 30% to no more than 40% plaquing at the takeoff of the PDA. The remainder of the right coronary artery is free of significant disease with an estimated size of between 5-6 mm in its main portion.  CONCLUSIONS:  1. Preserved overall left ventricular function with a minor wall motion abnormality (abnormal septal motion noted by echocardiography in 2000).  2. 50-60% lesion of the third diagonal branch is noted.  3. Mild ostial disease involving the circumflex marginal.  4. Mild segmental plaquing at the origin of the posterior descending in the AV portion of the right coronary artery.  DISPOSITION: It is difficult to say that the current coronary anatomy explains any of his chest pain. His enzymes have been negative. He has not had dynamic EKG changes. The current anatomy does suggest mild to moderate plaquing, but no high grade lesions. Aggressive medical therapy would be warranted on this patient. Continue follow up with [name]. [name], M.D. [facility] in addressing other issues would also be important.  2010 - Echo SUMMARY - LVEF is approximately 50 to 55% with mild hypokinesis of the distal anterior wall. There is a false chord at LV apex. - Aortic valve thickness was mildly increased. - There was mild mitral valvular regurgitation.  2010 - Myoview Overall Impression  Exercise Capacity: Adenosine study with no exercise.  ECG Impression: No significant ST segment change suggestive of ischemia.  Overall Impression: Low risk adenosine nuclear study with apical thinning vs small prior apical infarct and mild apical ischemia    Surgical History:  Past Surgical History  Procedure Date  . Appendectomy   . Cataract extraction   . Knee surgery     bilateral  . Shoulder surgery 04/2005    left by Dr. Luiz Blare  . 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 S/p cysto & stents for kidnedy stone 11/08 by dr. Wanda Plump   . Cystoscopy 04/17/11      stent placed  . Cystoscopy w/ ureteral stent placement 04/17/2011    Procedure: CYSTOSCOPY WITH RETROGRADE PYELOGRAM/URETERAL STENT PLACEMENT;  Surgeon: Lindaann Slough, MD;  Location: [facility];  Service: Urology;  Laterality: Left;     Home Meds: Medication Sig  amLODipine (NORVASC) 10 MG tablet Take 1 tablet (10 mg total) by mouth daily before breakfast.  aspirin EC 325 MG tablet Take 325 mg by mouth daily.  glimepiride (AMARYL) 1 MG tablet TAKE ONE TABLET BY MOUTH IN THE MORNING  lisinopril-hydrochlorothiazide (PRINZIDE,ZESTORETIC) 20-12.5 MG per tablet Take 1 tablet by mouth daily before breakfast.  metFORMIN (GLUCOPHAGE) 500 MG tablet Take 1 tablet (500 mg total) by mouth 2 (two) times daily with a meal.  metoprolol (LOPRESSOR) 50 MG tablet Take 1 tablet (50 mg total) by mouth 2 (two) times daily.  potassium chloride SA (K-DUR,KLOR-CON) 20 MEQ tablet Take 1 tablet (20 mEq total) by mouth daily.  Tamsulosin HCl (FLOMAX) 0.4 MG CAPS Take 1 capsule (0.4 mg total) by mouth daily.  VIAGRA 100 MG tablet USE AS DIRECTED  glucose blood (ONE TOUCH ULTRA TEST) test strip Check Blood Sugars daily or as directed  DX CODE: 250.00    Allergies:  Allergies  Allergen Reactions  . Codeine Phosphate     REACTION: nausea  . Morphine     REACTION: nausea    History   Social History  . Marital Status: Widowed    Spouse Name: N/A    Number of Children: 2  . Years of Education: N/A   Occupational History  . Retired    Social History Main Topics  . Smoking status: Never Smoker   . Smokeless tobacco: Not on file  . Alcohol Use: No  . Drug Use: No  . Sexually Active: Not on file   Other Topics Concern  . Not on file   Social History Narrative  . No narrative on file     Family History  Problem Relation Age of Onset  . Heart attack Mother   . Heart attack Mother     Review of Systems: General: negative for chills, fever, night sweats or weight changes.  Cardiovascular: As per  HPI  Dermatological: negative for rash Respiratory: negative for cough or wheezing Urologic: negative for hematuria Abdominal: negative for nausea, vomiting, diarrhea, bright red blood per rectum, melena, or hematemesis Neurologic: negative for visual changes, syncope, or dizziness All other systems reviewed and are otherwise negative except as noted above.  Labs:   Component Value Date   WBC 5.3 02/18/2012   HGB 16.3 02/18/2012   HCT 48.0 02/18/2012   MCV 92.4 02/18/2012   PLT 284 02/18/2012    Lab 02/18/12 1228  NA 138  K 3.7  CL 103  CO2 --  BUN 16  CREATININE 1.20  GLUCOSE 306*     02/18/2012 12:26  Troponin i, poc 0.01     Radiology/Studies:   02/18/2012 - PORTABLE CHEST - 1 VIEW Findings: Mild cardiomegaly.  Vascular congestion.  No confluent airspace opacities or effusions.  No acute bony abnormality.  Prior resection of the distal left clavicle.  Degenerative changes in the shoulders bilaterally.  IMPRESSION: Borderline cardiomegaly and vascular congestion.  No acute findings.      EKG: NSR with inferior Q waves, unchanged from prior EKG  Physical Exam: Blood pressure 156/81, pulse 63, temperature 98.3 F (36.8 C), temperature source Oral, resp. rate 22, SpO2 94.00%. General: Overweight elderly white male in no acute distress. Head: Normocephalic, atraumatic, sclera non-icteric, nares are without discharge Neck: Supple. Negative for carotid bruits. No JVD Lungs: Clear bilaterally to auscultation without wheezes, rales, or rhonchi. Breathing is unlabored. Heart: RRR with S1 S2. No murmurs, rubs, or gallops appreciated. Abdomen: Soft, non-tender, non-distended with normoactive bowel sounds. No rebound/guarding. No obvious abdominal masses. Msk:  Strength and tone appear normal for age. Extremities: No edema. No clubbing or cyanosis. Distal pedal pulses are intact and equal bilaterally. Neuro: Alert and oriented X 3. Moves all extremities spontaneously. Psych:  Responds to  questions appropriately with a anxious affect.    ASSESSMENT AND PLAN:  78 y.o. male w/ PMHx significant for nonobstructive CAD (cath '04 50-60% D3, 40-50% OM3, 30%-40 distal RCA), HLD, HTN, DMII, and anxiety who presented to [facility] on [facility]/18/2012 with complaints of left arm/shoulder pain and nausea.  1. Left shoulder/arm pain 2. Nonobstructive CAD by cath 2004 and low risk myoview 2010 3. Hyperlipidemia, not on statin 4. Hypertension 5. Diabetes Mellitus, Type 2 Uncontrolled 6. Anxiety  See MD note below  Signed, Alaney Witter PA-C 02/18/2012, 1:21 PM  Patient seen with PA, agree with the above note.  He has  had left shoulder and upper arm pain for 7 hours now.  He woke up with it.  No chest pain.  Pressing on his shoulder does cause pain.  He has had rotator cuff operation on the left.  Initial troponin negative and ECG shows inferior Qs, similar to prior.  I think that his pain is most likely musculoskeletal.  If another cardiac enzyme is negative, I think he can be discharged from a cardiac standpoint to get an outpatient Nuclear study.  Given known nonobstructive CAD, I suggested that he start a statin.  He wants to talk to Dr. [name] about this.   Marca Ancona 02/18/2012 1:54 PM   Addendum:  Repeat cardiac enzymes are normal. Patient can be discharged home and return to our [address] office for [facility] in the morning. He will then follow up with Tereso Newcomer, PA-C on 1/22.  [place], PA-C 02/18/2012 3:43 PM  [phone] pgr

## 2012-02-18 NOTE — ED Notes (Signed)
Per EMS: pts pain started 0600 this morning; pt drive self to fire dept; pt took 325 ASA around 0600. Pt was given 1 nitro and 325 ASA in ambulance; pt given 4 mg zofran en route; pts BP upon arrival was 250/130; en rout BP 138/92 after nitro. CBG 285.

## 2012-02-18 NOTE — ED Notes (Signed)
Pt states pain gone in stomach but pain in shoulder remains and is constant and will go sharp down left arm. Pt mentating appropriately. Pt denies feeling nauseous currently. Pt denies numbness and tingling.

## 2012-02-18 NOTE — ED Provider Notes (Addendum)
History     CSN: [id number]  Arrival date & time 02/18/12  1129   First MD Initiated Contact with Patient 02/18/12 1136      Chief Complaint  Patient presents with  . Shoulder Pain    (Consider location/radiation/quality/duration/timing/severity/associated sxs/prior treatment) HPI Complain of left shoulder pain radiating to left arm onset upon awakening a proximally 6 AM today. Patient was treated with aspirin, 1 sublingual nitroglycerin and Zofran prior to coming here reports nitroglycerin  provided no relief. Pain is intermittent lasting several seconds at a time severe. Associated symptoms include nausea no shortness of breath no sweatiness no other complaint Past Medical History  Diagnosis Date  . CAD (coronary artery disease)   . History of syncope   . HTN (hypertension)   . Borderline hyperlipidemia   . History of gastritis   . Diverticulosis of colon   . Colon polyps   . Hypertrophy of prostate with urinary obstruction and other lower urinary tract symptoms (LUTS)   . Renal calculus   . History of pyelonephritis   . DJD (degenerative joint disease)   . Anxiety   . DM (diabetes mellitus)     Adult onset    Past Surgical History  Procedure Date  . Appendectomy   . Cataract extraction   . Knee surgery     bilateral  . Shoulder surgery 04/2005    left by Dr. Luiz Blare  . 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 S/p cysto & stents for kidnedy stone 11/08 by dr. Wanda Plump   . Cystoscopy 04/17/11    stent placed  . Cystoscopy w/ ureteral stent placement 04/17/2011    Procedure: CYSTOSCOPY WITH RETROGRADE PYELOGRAM/URETERAL STENT PLACEMENT;  Surgeon: Lindaann Slough, MD;  Location: [facility];  Service: Urology;  Laterality: Left;    Family History  Problem Relation Age of Onset  . Heart attack Mother   . Heart attack Mother     History  Substance Use Topics  . Smoking status: Never Smoker   . Smokeless  tobacco: Not on file  . Alcohol Use: No      Review of Systems  Constitutional: Negative.   HENT: Negative.   Respiratory: Negative.   Cardiovascular: Negative.   Gastrointestinal: Negative.   Musculoskeletal: Positive for arthralgias.  Skin: Negative.   Neurological: Negative.   Hematological: Negative.   Psychiatric/Behavioral: Negative.   All other systems reviewed and are negative.    Allergies  Codeine phosphate and Morphine  Home Medications   Current Outpatient Rx  Name  Route  Sig  Dispense  Refill  . AMLODIPINE BESYLATE 10 MG PO TABS   Oral   Take 1 tablet (10 mg total) by mouth daily before breakfast.   90 tablet   3   . GLIMEPIRIDE 1 MG PO TABS      TAKE ONE TABLET BY MOUTH IN THE MORNING   90 tablet   3   . GLUCOSE BLOOD VI STRP      Check Blood Sugars daily or as directed  DX CODE: 250.00   100 each   5   . LISINOPRIL-HYDROCHLOROTHIAZIDE 20-12.5 MG PO TABS   Oral   Take 1 tablet by mouth daily before breakfast.         . METFORMIN HCL 500 MG PO TABS   Oral   Take 1 tablet (500 mg total) by mouth 2 (two) times daily with a meal.   180 tablet  3   . METOPROLOL TARTRATE 50 MG PO TABS   Oral   Take 1 tablet (50 mg total) by mouth 2 (two) times daily.   180 tablet   3   . POTASSIUM CHLORIDE CRYS ER 20 MEQ PO TBCR   Oral   Take 1 tablet (20 mEq total) by mouth daily.   90 tablet   3   . TAMSULOSIN HCL 0.4 MG PO CAPS   Oral   Take 1 capsule (0.4 mg total) by mouth daily.   90 capsule   3   . VIAGRA 100 MG PO TABS      USE AS DIRECTED   10 each   6     BP 155/82  Pulse 66  Temp 98.3 F (36.8 C) (Oral)  Resp 20  SpO2 93%  Physical Exam  Nursing note and vitals reviewed. Constitutional: He appears well-developed and well-nourished.       Anxious  HENT:  Head: Normocephalic and atraumatic.  Eyes: Conjunctivae normal are normal. Pupils are equal, round, and reactive to light.  Neck: Neck supple. No tracheal  deviation present. No thyromegaly present.  Cardiovascular: Normal rate and regular rhythm.   No murmur heard. Pulmonary/Chest: Effort normal and breath sounds normal.  Abdominal: Soft. Bowel sounds are normal. He exhibits no distension. There is no tenderness.  Musculoskeletal: Normal range of motion. He exhibits no edema and no tenderness.       Pain is not reproducible on active or passive motion of left shoulder. All 4 extremities neurovascularly intact, nontender  Neurological: He is alert. Coordination normal.  Skin: Skin is warm and dry. No rash noted.  Psychiatric: He has a normal mood and affect.    ED Course  Procedures (including critical care time)  Labs Reviewed - No data to display No results found. . Date: 02/18/2012  Rate: 70  Rhythm: normal sinus rhythm  QRS Axis: normal  Intervals: normal  ST/T Wave abnormalities: nonspecific T wave changes  Conduction Disutrbances:none  Narrative Interpretation:   Old EKG Reviewed: No significant change from 04/17/2011 interpreted by me Results for orders placed during the hospital encounter of 02/18/12  CBC WITH DIFFERENTIAL      Component Value Range   WBC 5.3  4.0 - 10.5 K/uL   RBC 5.11  4.22 - 5.81 MIL/uL   Hemoglobin 16.5  13.0 - 17.0 g/dL   HCT 19.1  47.8 - 29.5 %   MCV 92.4  78.0 - 100.0 fL   MCH 32.3  26.0 - 34.0 pg   MCHC 35.0  30.0 - 36.0 g/dL   RDW 62.1  30.8 - 65.7 %   Platelets 284  150 - 400 K/uL   Neutrophils Relative 61  43 - 77 %   Neutro Abs 3.2  1.7 - 7.7 K/uL   Lymphocytes Relative 27  12 - 46 %   Lymphs Abs 1.4  0.7 - 4.0 K/uL   Monocytes Relative 9  3 - 12 %   Monocytes Absolute 0.5  0.1 - 1.0 K/uL   Eosinophils Relative 3  0 - 5 %   Eosinophils Absolute 0.1  0.0 - 0.7 K/uL   Basophils Relative 0  0 - 1 %   Basophils Absolute 0.0  0.0 - 0.1 K/uL  POCT I-STAT, CHEM 8      Component Value Range   Sodium 138  135 - 145 mEq/L   Potassium 3.7  3.5 - 5.1 mEq/L   Chloride 103  96 -  112 mEq/L   BUN  16  6 - 23 mg/dL   Creatinine, Ser 9.60  0.50 - 1.35 mg/dL   Glucose, Bld 454 (*) 70 - 99 mg/dL   Calcium, Ion 0.98  1.19 - 1.30 mmol/L   TCO2 24  0 - 100 mmol/L   Hemoglobin 16.3  13.0 - 17.0 g/dL   HCT 14.7  82.9 - 56.2 %  POCT I-STAT TROPONIN I      Component Value Range   Troponin i, poc 0.01  0.00 - 0.08 ng/mL   Comment 3           URINALYSIS, ROUTINE W REFLEX MICROSCOPIC      Component Value Range   Color, Urine YELLOW  YELLOW   APPearance CLEAR  CLEAR   Specific Gravity, Urine 1.015  1.005 - 1.030   pH 6.0  5.0 - 8.0   Glucose, UA >1000 (*) NEGATIVE mg/dL   Hgb urine dipstick TRACE (*) NEGATIVE   Bilirubin Urine NEGATIVE  NEGATIVE   Ketones, ur NEGATIVE  NEGATIVE mg/dL   Protein, ur NEGATIVE  NEGATIVE mg/dL   Urobilinogen, UA 0.2  0.0 - 1.0 mg/dL   Nitrite NEGATIVE  NEGATIVE   Leukocytes, UA NEGATIVE  NEGATIVE  URINE MICROSCOPIC-ADD ON      Component Value Range   Squamous Epithelial / LPF RARE  RARE   WBC, UA 0-2  <3 WBC/hpf   RBC / HPF 0-2  <3 RBC/hpf   Bacteria, UA RARE  RARE  TROPONIN I      Component Value Range   Troponin I <0.30  <0.30 ng/mL  POCT I-STAT TROPONIN I      Component Value Range   Troponin i, poc 0.01  0.00 - 0.08 ng/mL   Comment 3           POCT I-STAT TROPONIN I      Component Value Range   Troponin i, poc 0.00  0.00 - 0.08 ng/mL   Comment 3            Dg Chest Port 1 View  02/18/2012  *RADIOLOGY REPORT*  Clinical Data: Chest pain.  PORTABLE CHEST - 1 VIEW  Comparison: 04/17/2011  Findings: Mild cardiomegaly.  Vascular congestion.  No confluent airspace opacities or effusions.  No acute bony abnormality.  Prior resection of the distal left clavicle.  Degenerative changes in the shoulders bilaterally.  IMPRESSION: Borderline cardiomegaly and vascular congestion.  No acute findings.   Original Report Authenticated By: Charlett Nose, M.D.     Chest X-ray reviewed by me No diagnosis found. 1:20 PM patient reports that he had transient relief of  pain after treatment with intravenous morphine. Requesting more pain medicine additional morphine ordered  [place] cardiology service called to evaluate patient 430 pm feels improved after tx in the ed and ready to go home Cardiology service evaluate patient and arrange for an outpatient stress test tomorrow at 9 AM MDM  Pain atypical for   acute coronary syndrome However patient does have cardiac risk factors and history of coronary artery disease  Plan rx vicodin f/u [place] [facility]ilogy office tomorrow   Diagnosis #1 left arm pain #2 hyperglycemia   Doug Sou, MD 02/18/12 1635  Doug Sou, MD 02/18/12 2023

## 2012-02-19 ENCOUNTER — Ambulatory Visit (HOSPITAL_COMMUNITY): Payer: Medicare Other | Attending: Cardiology | Admitting: Radiology

## 2012-02-19 VITALS — Ht 72.0 in | Wt 236.0 lb

## 2012-02-19 DIAGNOSIS — R079 Chest pain, unspecified: Secondary | ICD-10-CM

## 2012-02-19 DIAGNOSIS — I10 Essential (primary) hypertension: Secondary | ICD-10-CM | POA: Insufficient documentation

## 2012-02-19 DIAGNOSIS — R9431 Abnormal electrocardiogram [ECG] [EKG]: Secondary | ICD-10-CM

## 2012-02-19 DIAGNOSIS — I2510 Atherosclerotic heart disease of native coronary artery without angina pectoris: Secondary | ICD-10-CM

## 2012-02-19 DIAGNOSIS — R42 Dizziness and giddiness: Secondary | ICD-10-CM | POA: Insufficient documentation

## 2012-02-19 DIAGNOSIS — E119 Type 2 diabetes mellitus without complications: Secondary | ICD-10-CM | POA: Insufficient documentation

## 2012-02-19 MED ORDER — TECHNETIUM TC 99M SESTAMIBI GENERIC - CARDIOLITE
33.0000 | Freq: Once | INTRAVENOUS | Status: AC | PRN
  Administered 2012-02-19: 33 via INTRAVENOUS

## 2012-02-19 MED ORDER — TECHNETIUM TC 99M SESTAMIBI GENERIC - CARDIOLITE
11.0000 | Freq: Once | INTRAVENOUS | Status: AC | PRN
  Administered 2012-02-19: 11 via INTRAVENOUS

## 2012-02-19 MED ORDER — REGADENOSON 0.4 MG/5ML IV SOLN
0.4000 mg | Freq: Once | INTRAVENOUS | Status: AC
  Administered 2012-02-19: 0.4 mg via INTRAVENOUS

## 2012-02-19 NOTE — Progress Notes (Signed)
[facility] SITE 3 NUCLEAR MED [address] [place], [place] [address] [phone]    Cardiology Nuclear Med Study  [name] is a 78 y.o. male     MRN : [id number]     DOB: 03-Oct-1934  Procedure Date: 02/19/2012  Nuclear Med Background Indication for Stress Test:  Evaluation for Ischemia and Post Hospital- [facility] ER 02/18/12- Left shoulder and arm pain, negative cardiac enzymes. History: '01 Myocardial Perfusion Study-no ischemia, EF=49%, '04 Heart Catheterization-nonobstructive CAD, 3-10 Echo: EF=50-55%, mild MR Cardiac Risk Factors: Family History - CAD, Hypertension, Lipids and NIDDM  Symptoms:  Diaphoresis, Dizziness, Nausea, Near Syncope and Vomiting associated with non-exertional (L) arm and shoulder pain (last occurrence at present, pain is 6/10).    Nuclear Pre-Procedure Caffeine/Decaff Intake:  None NPO After: 6:00PM   Lungs:  clear O2 Sat: 94-98% after deep breaths on room air. IV 0.9% NS with Angio Cath:  22g  IV Site: R Antecubital  IV Started by:  Burna Mortimer Deal, RT-N  Chest Size (in):  44 Cup Size: n/a  Height: 6' (1.829 m)  Weight:  236 lb (107.049 kg)  BMI:  Body mass index is 32.01 kg/(m^2). Tech Comments: Burna Mortimer Deal discussed patient symptoms and preliminary images  with Dr. Lewayne Bunting with an ok for the patient to leave, but keep follow-up post hospital office visit on 03-03-12 with Tereso Newcomer, PAC. Patient instructed to go to ED if develops chest pain or persistent symptoms. Irean Hong, RN    Nuclear Med Study 1 or 2 day study: 1 day  Stress Test Type:  Eugenie Birks  Reading MD: Olga Millers, MD  Order Authorizing Provider:  Annice Pih  Resting Radionuclide: Technetium 65m Sestamibi  Resting Radionuclide Dose: 11.0 mCi   Stress Radionuclide:  Technetium 30m Sestamibi  Stress Radionuclide Dose: 33.0 mCi           Stress Protocol Rest HR: 58 Stress HR: 82  Rest BP: 160/92 Stress BP: 165/78  Exercise Time (min): n/a METS: n/a   Predicted  Max HR: 145 bpm % Max HR: 56.55 bpm Rate Pressure Product: 78469    Dose of Adenosine (mg):  n/a Dose of Lexiscan: 0.4 mg  Dose of Atropine (mg): n/a Dose of Dobutamine: n/a mcg/kg/min (at max HR)  Stress Test Technologist: Irean Hong, RN  Nuclear Technologist:  Domenic Polite, CNMT     Rest Procedure:  Myocardial perfusion imaging was performed at rest 45 minutes following the intravenous administration of Technetium 61m Sestamibi. Rest ECG: NSR with nonspecific ST changes.  Stress Procedure:  The patient received IV Lexiscan 0.4 mg over 15-seconds.  Technetium 28m Sestamibi injected at 30-seconds. The patient continued to have (L) arm/shoulder pain with numbness that decreased to 4/10 in recovery, nausea, but denied chest pain.  Quantitative spect images were obtained after a 45 minute delay. Stress ECG: No significant ST segment change suggestive of ischemia.  QPS Raw Data Images:  Acquisition technically good; LVE. Stress Images:  There is decreased uptake in the apex. Rest Images:  There is decreased uptake in the apex. Subtraction (SDS):  No evidence of ischemia. Transient Ischemic Dilatation (Normal <1.22):  1.02 Lung/Heart Ratio (Normal <0.45):  0.35  Quantitative Gated Spect Images QGS EDV:  193 ml QGS ESV:  110 ml  Impression Exercise Capacity:  Lexiscan with no exercise. BP Response:  Normal blood pressure response. Clinical Symptoms:  Patient complained of left arm pain. ECG Impression:  No significant ST segment change suggestive of ischemia. Comparison  with Prior Nuclear Study: No images to compare  Overall Impression:  Intermediate stress nuclear study due to LV dysfunction; small, mild, fixed apical defect consistent with thinning; no ischemia.  LV Ejection Fraction: 43%.  LV Wall Motion:  Global hypokinesis.   Olga Millers

## 2012-03-03 ENCOUNTER — Encounter: Payer: Self-pay | Admitting: Physician Assistant

## 2012-03-03 ENCOUNTER — Ambulatory Visit (INDEPENDENT_AMBULATORY_CARE_PROVIDER_SITE_OTHER): Payer: Medicare Other | Admitting: Physician Assistant

## 2012-03-03 VITALS — BP 160/78 | HR 60 | Ht 72.0 in | Wt 240.0 lb

## 2012-03-03 DIAGNOSIS — M25519 Pain in unspecified shoulder: Secondary | ICD-10-CM

## 2012-03-03 DIAGNOSIS — I10 Essential (primary) hypertension: Secondary | ICD-10-CM

## 2012-03-03 DIAGNOSIS — I2510 Atherosclerotic heart disease of native coronary artery without angina pectoris: Secondary | ICD-10-CM

## 2012-03-03 DIAGNOSIS — E785 Hyperlipidemia, unspecified: Secondary | ICD-10-CM

## 2012-03-03 MED ORDER — PRAVASTATIN SODIUM 40 MG PO TABS: 40.0000 mg | ORAL_TABLET | Freq: Every day | ORAL | Status: DC

## 2012-03-03 MED ORDER — LISINOPRIL-HYDROCHLOROTHIAZIDE 20-25 MG PO TABS: 1.0000 | ORAL_TABLET | Freq: Every day | ORAL | Status: DC

## 2012-03-03 NOTE — Patient Instructions (Addendum)
INCREASE LISINOPRIL/HCT TO 20-25 MG DAILY AN NEW RX WAS SENT IN TODAY; HOWEVER FINISH THE BOTTLE YOU HAVE NOW BEFORE STARTING THE NEW DOSE  START PRAVASTATIN 40 MG 1 TABLET EVERY NIGHT BEFORE BEDTIME  LAB WORK; BMET TO BE DONE 1-2 WEEKS AFTER YOU START THE NEW DOSE OF LISINOPRIL-HCT 20-25 LAB WORK; FASTING LIPID/LIVER PANEL TO BE DONE IN 6 WEEKS AFTER STARTING THE PRAVASTATIN;   PLEASE FOLLOW UP WITH DR. WALL IN 4 MONTHS

## 2012-03-03 NOTE — Progress Notes (Signed)
[address], [address] [place], [place]  [address] Phone: [phone], Fax:  [phone]  Date:  03/03/2012   ID:  [name], DOB [birth year], MRN [id number]  PCP:  No primary provider on file.  Primary Cardiologist:  Dr. Valera Castle     History of Present Illness: [name] is a 78 y.o. male who returns for followup after a recent trip to the emergency room with left shoulder pain.  He has a history of nonobstructive CAD, DM 2, HTN, HL and anxiety. LHC 6/04: EF 50-55%, D3 50-60%, ostial OM 340-50%, OM 40-50%, PDA 30-40%. Echo 3/10: EF 50-55%, distal anterior HK, mild MR. Myoview 3/10: Apical thinning versus small prior apical infarct, mild apical ischemia, EF 47% (low risk): Patient was seen in the emergency room 02/18/12 with left shoulder and arm pain. He was evaluated by Dr. Shirlee Latch. His symptoms seem to be musculoskeletal in origin. Cardiac markers remained negative. He was set up for outpatient stress test. Lexiscan Myoview 02/19/12: EF 43%, small mild fixed apical defect consistent with thinning, no ischemia.  Patient continues to have left shoulder pain. This is clearly reproduced by positional changes. He denies chest discomfort. He denies orthopnea, PND, significant dyspnea, syncope, pedal edema.  Labs (2/13):   K 4.6, creatinine 1.1, ALT 52, LDL 112, TSH 3.14 Labs (1/14):   K 3.7, creatinine 1.2, Hgb 16.3  Wt Readings from Last 3 Encounters:  03/03/12 240 lb (108.863 kg)  02/19/12 236 lb (107.049 kg)  10/09/11 233 lb (105.688 kg)     Past Medical History  Diagnosis Date  . CAD (coronary artery disease)   . History of syncope   . HTN (hypertension)   . Borderline hyperlipidemia   . History of gastritis   . Diverticulosis of colon   . Colon polyps   . Hypertrophy of prostate with urinary obstruction and other lower urinary tract symptoms (LUTS)   . Renal calculus   . History of pyelonephritis   . DJD (degenerative joint disease)   . Anxiety     . DM (diabetes mellitus)     Adult onset    Current Outpatient Prescriptions  Medication Sig Dispense Refill  . amLODipine (NORVASC) 10 MG tablet Take 1 tablet (10 mg total) by mouth daily before breakfast.  90 tablet  3  . aspirin EC 325 MG tablet Take 325 mg by mouth daily.      Marland Kitchen glimepiride (AMARYL) 1 MG tablet TAKE ONE TABLET BY MOUTH IN THE MORNING  90 tablet  3  . glucose blood (ONE TOUCH ULTRA TEST) test strip Check Blood Sugars daily or as directed  DX CODE: 250.00  100 each  5  . lisinopril-hydrochlorothiazide (PRINZIDE,ZESTORETIC) 20-12.5 MG per tablet Take 1 tablet by mouth daily before breakfast.      . metFORMIN (GLUCOPHAGE) 500 MG tablet Take 1 tablet (500 mg total) by mouth 2 (two) times daily with a meal.  180 tablet  3  . metoprolol (LOPRESSOR) 50 MG tablet Take 1 tablet (50 mg total) by mouth 2 (two) times daily.  180 tablet  3  . potassium chloride SA (K-DUR,KLOR-CON) 20 MEQ tablet Take 1 tablet (20 mEq total) by mouth daily.  90 tablet  3  . Tamsulosin HCl (FLOMAX) 0.4 MG CAPS Take 1 capsule (0.4 mg total) by mouth daily.  90 capsule  3  . HYDROcodone-acetaminophen (NORCO/VICODIN) 5-325 MG per tablet Take 2 tablets by mouth every 6 (six) hours as needed for pain.  6 tablet  0    Allergies:    Allergies  Allergen Reactions  . Codeine Phosphate     REACTION: nausea  . Morphine     REACTION: nausea    Social History:  The patient  reports that he has never smoked. He does not have any smokeless tobacco history on file. He reports that he does not drink alcohol or use illicit drugs.   ROS:  Please see the history of present illness.     All other systems reviewed and negative.   PHYSICAL EXAM: VS:  BP 160/78  Pulse 60  Ht 6' (1.829 m)  Wt 240 lb (108.863 kg)  BMI 32.55 kg/m2 Well nourished, well developed, in no acute distress HEENT: normal Neck: no JVD Cardiac:  normal S1, S2; RRR; no murmur Lungs:  clear to auscultation bilaterally, no wheezing, rhonchi  or rales Abd: soft, nontender, no hepatomegaly Ext: no edema MSK:  Left shoulder with positive crepitus with passive range of motion and point tenderness over the a.c. joint Skin: warm and dry Neuro:  CNs 2-12 intact, no focal abnormalities noted  EKG:  NSR, HR 61, inferior Q waves, nonspecific ST-T wave changes, no change from prior tracing     ASSESSMENT AND PLAN:  1. Shoulder Pain:  This appears to be musculoskeletal. His stress test is unchanged from 2010. He can followup with his PCP for further management. 2. Coronary Artery Disease:  As noted, his stress test is unchanged from 2010. His ejection fraction was mildly reduced in 2010 as well as his most recent study. His echocardiogram in 2010 demonstrated normal LV function. At this point, it does not appear that he needs further cardiac evaluation. He can continue on aspirin. He should be on a statin. He is agreeable to this I will place him on pravastatin 40 mg each bedtime. 3. Hyperlipidemia:  Start pravastatin as noted. Check lipid panel and LFTs in 6 weeks. 4. Hypertension:  Uncontrolled. Increase lisinopril HCTZ to 20/25 mg daily. He will need a basic metabolic panel 1-2 weeks later. 5. Disposition: Followup with Dr. Daleen Squibb in 4 months.  Signed, Tereso Newcomer, PA-C  2:40 PM 03/03/2012

## 2012-03-22 ENCOUNTER — Encounter: Payer: Self-pay | Admitting: Pulmonary Disease

## 2012-03-22 ENCOUNTER — Ambulatory Visit (INDEPENDENT_AMBULATORY_CARE_PROVIDER_SITE_OTHER): Payer: Medicare Other | Admitting: Pulmonary Disease

## 2012-03-22 ENCOUNTER — Other Ambulatory Visit (INDEPENDENT_AMBULATORY_CARE_PROVIDER_SITE_OTHER): Payer: Medicare Other

## 2012-03-22 VITALS — BP 180/90 | HR 59 | Temp 97.7°F | Ht 72.0 in | Wt 240.8 lb

## 2012-03-22 DIAGNOSIS — I2510 Atherosclerotic heart disease of native coronary artery without angina pectoris: Secondary | ICD-10-CM

## 2012-03-22 DIAGNOSIS — F411 Generalized anxiety disorder: Secondary | ICD-10-CM

## 2012-03-22 DIAGNOSIS — I10 Essential (primary) hypertension: Secondary | ICD-10-CM

## 2012-03-22 DIAGNOSIS — K2990 Gastroduodenitis, unspecified, without bleeding: Secondary | ICD-10-CM

## 2012-03-22 DIAGNOSIS — N200 Calculus of kidney: Secondary | ICD-10-CM

## 2012-03-22 DIAGNOSIS — IMO0001 Reserved for inherently not codable concepts without codable children: Secondary | ICD-10-CM

## 2012-03-22 DIAGNOSIS — N401 Enlarged prostate with lower urinary tract symptoms: Secondary | ICD-10-CM

## 2012-03-22 DIAGNOSIS — E785 Hyperlipidemia, unspecified: Secondary | ICD-10-CM | POA: Insufficient documentation

## 2012-03-22 DIAGNOSIS — K2970 Gastritis, unspecified, without bleeding: Secondary | ICD-10-CM

## 2012-03-22 DIAGNOSIS — E1159 Type 2 diabetes mellitus with other circulatory complications: Secondary | ICD-10-CM | POA: Insufficient documentation

## 2012-03-22 DIAGNOSIS — N138 Other obstructive and reflux uropathy: Secondary | ICD-10-CM

## 2012-03-22 DIAGNOSIS — D126 Benign neoplasm of colon, unspecified: Secondary | ICD-10-CM

## 2012-03-22 DIAGNOSIS — K5730 Diverticulosis of large intestine without perforation or abscess without bleeding: Secondary | ICD-10-CM

## 2012-03-22 DIAGNOSIS — M1990 Unspecified osteoarthritis, unspecified site: Secondary | ICD-10-CM

## 2012-03-22 LAB — CBC WITH DIFFERENTIAL/PLATELET
Basophils Absolute: 0 10*3/uL (ref 0.0–0.1)
HCT: 46.8 % (ref 39.0–52.0)
Lymphocytes Relative: 29.8 % (ref 12.0–46.0)
Lymphs Abs: 1.6 10*3/uL (ref 0.7–4.0)
Monocytes Relative: 11.5 % (ref 3.0–12.0)
Neutrophils Relative %: 52.9 % (ref 43.0–77.0)
Platelets: 310 10*3/uL (ref 150.0–400.0)
RDW: 13.8 % (ref 11.5–14.6)

## 2012-03-22 LAB — HEPATIC FUNCTION PANEL
ALT: 45 U/L (ref 0–53)
AST: 24 U/L (ref 0–37)
Albumin: 4.3 g/dL (ref 3.5–5.2)
Alkaline Phosphatase: 57 U/L (ref 39–117)
Total Protein: 7.2 g/dL (ref 6.0–8.3)

## 2012-03-22 LAB — BASIC METABOLIC PANEL
CO2: 29 mEq/L (ref 19–32)
Chloride: 101 mEq/L (ref 96–112)
Glucose, Bld: 224 mg/dL — ABNORMAL HIGH (ref 70–99)
Potassium: 4.3 mEq/L (ref 3.5–5.1)
Sodium: 139 mEq/L (ref 135–145)

## 2012-03-22 LAB — LIPID PANEL: Cholesterol: 173 mg/dL (ref 0–200)

## 2012-03-22 LAB — HEMOGLOBIN A1C: Hgb A1c MFr Bld: 8.8 % — ABNORMAL HIGH (ref 4.6–6.5)

## 2012-03-22 LAB — PSA: PSA: 2.61 ng/mL (ref 0.10–4.00)

## 2012-03-22 MED ORDER — HYDRALAZINE HCL 25 MG PO TABS: 25.0000 mg | ORAL_TABLET | Freq: Two times a day (BID) | ORAL | Status: DC

## 2012-03-22 NOTE — Progress Notes (Addendum)
Subjective:    Patient ID: [name], male    DOB: 03-27-1934, 78 y.o.   MRN: [id number]  HPI 78 y/o WM here for a follow up visit... he has multiple medical problems as noted below... Followed for general medical purposes w/ HBP, CAD, Hyperlipidemia, DM, DJD, etc... also sees DrWall for Cards, DrPerry for GI (Gastritis, Divertics, Polyps), & DrBorden for Urology (Hx Kidney stones & BPH)...   ~  September 16, 2010:  7mo ROV> generally feeling well, hx severe DJD in kness as noted, "I get plenty of exercise", had shot in knee recently by [facility], takes Aleve prn...    He saw DrBorden 3/12 for f/u stones, BPH w/ LUTS, ED & PSA screening> doing well on Flomax, PSA= 2.0, he wanted to do 24H urine but pt declined.    Noted small lump in ant neck 7/12 & saw TP; Thyroid Sonar showed 3 sm (<68mm) thyr cysts- the palp lesion is in left SCM muscle w/ benign features; we will follow clinically- no changes noted.    LABS 7/12 reviewed: BS=142 (A1c today= 8.4); other Chems/ CBC/ TFTs> all wnl; REC- add GLIMEPIRIDE 1mg /d w/ A1c continuing to rise...  ~  March 19, 2011:  7mo ROV & he reports a good interval, notes that his home BS checks have all been good, brother adm to [facility] w/ Alz...    <HBP> controlled on Metop50Bid, Amlod10, LisinHCT 20-12.5, & K20/d; BP= 134/82 & he denies CP, palpit, dizzy, SOB, edema, etc...    <CAD> known non-obstructive CAD followed by DrWall/Nishan & doing well w/o CP or other symptoms...    <Hyperlipid> on diet alone and FLP today shows TChol 182, TG 178, HDL 34, LDL 112; needs better low fat diet & wt reduction...    <DM> on Metform500Bid + Glimep1mg /d; states BS at home all 100-110 but FBS today= 160 & A1c=7.8; may need incr dose, for now get wt down!    <GI- Gastritis, Divertics, Polyps> stable & doing well on Prilosec prn & overdue for colon screen & he will call...    <GU- BPH, KidStones> on Flomax & doing well w/o signif urinary symptoms...    <DJD> followed by Vinetta Bergamo on  OTC meds andhe notes shots help some...  ~  September 17, 2011:  7mo ROV & Kwasi has had a time w/ kidney stones> went to ER 3/13 w/ left flank pain, found to have obstructive uropathy, seen by DrNesi/ Laverle Patter- cysto, stent placed, lithotripsy, then stent removed; stone analysis was calcium oxalate; voiding satis on Flomax & they discussed dietary adjustments; he has stable stone in right renal pelvis being followed...     BP controlled on Metop, Amlodip, Lisin/Hct; BP= 152/88 today & he denies CP, palpit, SOB, edema, etc...     Chol has been fair on diet alone & he has repeatedly refused med rx...    DM w/ fair regulation on Metform+Glimep but he c/o low sugars despite A1c in the mid 7's; see labs below...    He has DJD but managing satis on OTC analgesics... We reviewed prob list, meds, xrays and labs> see below for updates >> LABS 8/13:  Chems- ok x BS=163;  A1c=7.6  ~  March 22, 2012:  7mo ROV & Samik says he's feeling well- no new complaints or concerns; but he was in the ER 1/14 w/ CP & left arm pain> had Lexiscan Myoview which showed some apical thinning, no ischemia, +global LV dysfunction w/ EF=43% & global HK;  He  had f/u [facility] 1/14 & his BP was 160/80 & they adjusted his meds- added Prav40 & increased LisinoprilHCT to 20-25    HBP> on Metop50Bid, Amlod10, LisinHCT 20-25, K20/d; BP= 180/90 & he denies further CP, palpit, dizzy, SOB, edema; we decided to ADD APRESOLINE 25mg Bid.    CAD> on ASA325; known non-obstructive CAD (cath 2004) followed by DrWall/Nishan w/ ER visit 1/14 & Myoview as above...    Hyperlipid> prev on diet alone but Prav40 was added 1/14 by Cards; FLP shows TChol 173, TG 178, HDL 33, LDL 104; Rec- continue same, better diet...    DM> on Metform500Bid + Glimep1mg /d; labs showed BS= 224, A1c= 8.8; Rec- incr Metform-2Bid, Glimep4, + better diet & ROV 57mo...    GI- Gastritis, Divertics, Polyps> stable & doing well on Prilosec prn; Last colon 4/11 showed divertics, hems,  ?evid of prior sigm resection? (he never had colon surg)...    GU- BPH, KidStones> on Flomax & doing well w/o signif urinary symptoms; last stone & lithotripsy was 3/13...    DJD> followed by Vinetta Bergamo on Hydrocodone and he notes it is hard to exercise due to his knees... We reviewed prob list, meds, xrays and labs> see below for updates >> he had the Flu shot 10/13 & TDAP 8/13... EKG 1/14 showed NSR, rate61, inferior scar, NSSTTWA... LABS 2/14:  FLP- not at goals on Prav40, needs beeter diet, get wt down;  Chems- ok x BS=224 A1c=8.8;  CBC- wnl;  PSA=2.61...          Problem List:  HEARING LOSS - he also sees DrShoemaker w/ hearing eval 7/09 & new hearing aides...  HYPERTENSION (ICD-401.9) - controlled on METOPROLOL 50mg Bid, LISINOPRIL/HCT 20-25, KCl 20/d & NORVASC 10mg /d...   ~  3/10:  Metoprolol 50mg  Bid added to his regimen. ~  2/12:  he was using an OLD MED LIST (which we discarded) & we reviewed his 4 med regimen & asked him to monitor BP at home. ~  2/13:  BP= 134/82> he knows to follow a low salt diet and needs to lose wt; he is asymtomatic- denies HA, fatigue, visual changes, CP, palipit, dizziness, syncope, dyspnea, edema, etc... ~  CXR 3/13 showed cardiomeg, lower lung vols, clear/ NAD.Marland Kitchen. ~  8/13:  BP= 152/88 & needs to take meds regularly, get wt down... ~  1/14:  BP was 160/80 & Cards incr the LisinHCT to 20-25 daily... ~  2/14: on Metop50Bid, Amlod10, LisinHCT 20-25, K20/d; BP= 180/90 & he denies further CP, palpit, dizzy, SOB, edema; we decided to ADD APRESOLINE 25mg Bid.  CAD (ICD-414.00) - Hx non-obstructive disease on cath 6/04 w/ 50%3rdDiag branch of LAD, 40%CIRC, 30%distalRCA... he takes ASA 81mg  daily and practicing secondary risk factor reduction strategy...  currently w/o CP, palpit, change in breathing, etc... last seen by DrWall 4/10> stable, no changes made. ~  3/10:  Myoview showed mild global HK, EF= 47%, apic thinning vs sm infarct & ?mild ischemia. ~  7/11:  no CP,  palpit, SOB, etc... he is active- walking, yard, etc... ~  9/11:  adm by [facility] w/ atypCP, seen by Walker Kehr & no further testing felt necessary. ~  1/14: he went to ER 1/14 w/ CP & left arm pain> Lexiscan Myoview which showed some apical thinning, no ischemia, +global LV dysfunction w/ EF=43% & global HK.  HYPERLIPIDEMIA, BORDERLINE (ICD-272.4) - prev on diet alone, bur Cards 1/14 added PRAVASTATIN40...  ~  FLP 12/07 showing TChol 157, TG 137, HDL 35, LDL 95 ~  FLP  1/09 showed TChol 170, TG 144, HDL 32, LDL 109 ~  FLP 7/09 (wt= 233#) showed TChol 158, TG 162, HDL 31, LDL 95 ~  FLP in hosp 3/10 showed TChol 154, TG 177, HDL 25, LDL 94... he doesn't want Fibrate Rx. ~  FLP 7/10 showed TChol 160, TG 162, HDL 30, LDL 98... still refuses med Rx... ~  FLP 1/11 showed TChol 174, TG 170, HDL 36, LDL 104 ~  FLP 7/11 showed TChol 161, TG 192, HDL 34, LDL 88 ~  FLP 2/12 showed TChol 166, TG 273, HDL 31, LDL 103... worse TG- may need Fibrate if wt not coming down! ~  FLP 2/13 on diet alone showed TChol 182, TG 178, HDL 34, LDL 112... He refuses med rx. ~  FLP 2/14 on Prav40 x78mo showed TChol 173, TG 178, HDL 33, LDL 104... Rec- continue Rx & diet.  AODM (ICD-250.00) - on METFORMIN 500mg Bid + GLIMEPIRIDE 1mg /d (added 8/12); discussed weight reduction strategies... ~  in 2008 BS=127, & HgA1c=6.5 on Metformin 500Bid... pt decr to 1/d on his own. ~  labs 1/09 showed BS= 143, HgA1c= 6.8.Marland Kitchen. ~  labs 7/09 (wt= 233#) showed BS= 136, HgA1c= 6.9.Marland KitchenMarland Kitchen rec- OK on Metform500/d, needs better diet. ~  labs 2/10 (wt= 242#) showed BS= 171, HgA1c= 6.9.Marland Kitchen. rec> get wt down. ~  labs 7/10 (wt= 240#) showed BS= 135, A1c= 7.0.Marland KitchenMarland Kitchen may need additional med. ~  labs 1/11 (wt=245#) showed BS= 149, A1c= 7.1 ~  labs 7/11 (wt= 226#) showed BS= 146, A1c= 7.3 ~  labs 2/12 (wt=236#) showed BS= 151, A1c= 7.7.Marland Kitchen. rec> incr METFORM500Bid & get wt down. ~  Labs 8/12 (wt=232#) showed BS= 142, A1c= 8.4... Rec> add GLIMEPIRIDE 1mg /d. ~  Labs 2/13  (wt=241#) showed BS=160, A1c= 7.8.Marland KitchenMarland Kitchen Must get wt down or incr meds... ~  Labs 8/13 (wt=232#) showed BS=163, A1c= 7.6.Marland KitchenMarland Kitchen Continue same. ~  Labs 2/14 (wt=241#) showed BS= 224, A1c= 8.8.Marland KitchenMarland Kitchen Rec- incr Metform500-2Bid & Glimep4/d...  Hx of GASTRITIS (ICD-535.50) - on PRILOSEC 20mg  OTC Prn... ~  EGD 2/09 by DrDBrodie showed 2cmHH, mod esophagitis, duodenitis... Rx'd ProtonixBid.  DIVERTICULOSIS OF COLON (ICD-562.10) >> COLONIC POLYPS (ICD-211.3) >> ~  Colonoscopy 9/05 by DrPerry showed divertics & diminutive polyp... f/u planned 5 years. ~  Colonoscopy 4/11 showed mod divertics, evid of prior sigmoid resection (?- we don't have hx of this), int hems... ~  CT Abd 3/13> acute obstructive uropathy w/ left ureteral stone; another stone in right renal pelvis, 22mm hyperdense left upper pole renal lesion ?hemorrhagic cyst, enlarged prostate, hepatic steatosis, diverticulosis...  HYPERTROPHY PROSTATE W/UR OBST & OTH LUTS (ICD-600.01) RENAL CALCULUS (ICD-592.0) - on FLOMAX 0.4mg /d per Urology & followed for BPH, Urolithiasis, ED & PSA screening purposes. ~  he has prev required cystoscopy w/ stents, & prev had lithotripsy as well...  ~  labs 7/10 showed PSA= 2.09, & subseq labs by DrRDavis (we don't have his notes). ~  11/11:  seen by DrBorden & passed an 8mm right ureteral stone, XRays show bilat nonobstructing stones in kidneys. ~  3/12:  Continued f/u w/ DrBorden & PSA= 2.0 ~  2/13:  PSA= 2.57 & he is voiding satis ~  3/13:  ER 3/13 w/ left flank pain, found to have obstructive uropathy, seen by DrNesi/ Laverle Patter- cysto, stent placed, lithotripsy, then stent removed; stone analysis was calcium oxalate; voiding satis on Flomax & they discussed dietary adjustments; he has stable stone in right renal pelvis being followed...   Hx of PYELONEPHRITIS (ICD-590.80) -  Adm 4/08 w/ pyelo from obstructing R kidney stone requiring cysto, stent, laser fragmentation...  DEGENERATIVE JOINT DISEASE (ICD-715.90) - he uses  OTC meds w/ relief... followed by Sherin Quarry w/ "endstage" DJD in knees (left worse than right) & told he needs TKRs- he is determined to "keep them a little bit longer"...  ANXIETY (ICD-300.00) - his wife passed away 06/30/05 & he has been enjoying fishing, hunting, and taking care of his 21 y/o grandson.   Past Surgical History  Procedure Laterality Date  . Appendectomy    . Cataract extraction    . Knee surgery      bilateral  . Shoulder surgery  04/2005    left by Dr. Luiz Blare  . 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 S/p cysto & stents for kidnedy stone 11/08 by dr. Wanda Plump    . Cystoscopy  04/17/11    stent placed  . Cystoscopy w/ ureteral stent placement  04/17/2011    Procedure: CYSTOSCOPY WITH RETROGRADE PYELOGRAM/URETERAL STENT PLACEMENT;  Surgeon: Lindaann Slough, MD;  Location: [facility];  Service: Urology;  Laterality: Left;    Outpatient Encounter Prescriptions as of 03/22/2012  Medication Sig Dispense Refill  . amLODipine (NORVASC) 10 MG tablet Take 1 tablet (10 mg total) by mouth daily before breakfast.  90 tablet  3  . aspirin EC 325 MG tablet Take 325 mg by mouth daily.      Marland Kitchen glimepiride (AMARYL) 1 MG tablet TAKE ONE TABLET BY MOUTH IN THE MORNING  90 tablet  3  . glucose blood (ONE TOUCH ULTRA TEST) test strip Check Blood Sugars daily or as directed  DX CODE: 250.00  100 each  5  . HYDROcodone-acetaminophen (NORCO/VICODIN) 5-325 MG per tablet Take 2 tablets by mouth every 6 (six) hours as needed for pain.  6 tablet  0  . lisinopril-hydrochlorothiazide (PRINZIDE,ZESTORETIC) 20-25 MG per tablet Take 1 tablet by mouth daily.  30 tablet  11  . metoprolol (LOPRESSOR) 50 MG tablet Take 1 tablet (50 mg total) by mouth 2 (two) times daily.  180 tablet  3  . potassium chloride SA (K-DUR,KLOR-CON) 20 MEQ tablet Take 1 tablet (20 mEq total) by mouth daily.  90 tablet  3  . pravastatin (PRAVACHOL) 40 MG tablet Take 1 tablet  (40 mg total) by mouth at bedtime.  30 tablet  11  . Tamsulosin HCl (FLOMAX) 0.4 MG CAPS Take 1 capsule (0.4 mg total) by mouth daily.  90 capsule  3  . metFORMIN (GLUCOPHAGE) 500 MG tablet Take 1 tablet (500 mg total) by mouth 2 (two) times daily with a meal.  180 tablet  3   No facility-administered encounter medications on file as of 03/22/2012.    Allergies  Allergen Reactions  . Codeine Phosphate     REACTION: nausea  . Morphine     REACTION: nausea    Current Medications, Allergies, Past Medical History, Past Surgical History, Family History, and Social History were reviewed in [employer] record.    Review of Systems    See HPI - all other systems neg except as noted...  The patient complains of decreased hearing, dyspnea on exertion, and difficulty walking.  The patient denies anorexia, fever, weight loss, weight gain, vision loss, hoarseness, chest pain, syncope, peripheral edema, prolonged cough, headaches, hemoptysis, abdominal pain, melena, hematochezia, severe indigestion/heartburn, hematuria, incontinence, muscle weakness, suspicious skin lesions, transient blindness, depression, unusual weight change, abnormal bleeding, enlarged  lymph nodes, and angioedema.     Objective:   Physical Exam    WD, Overweight, 78 y/o WM in NAD... GENERAL:  Alert & oriented; pleasant & cooperative... HEENT:  [place]/AT, EOM-wnl, PERRLA, EACs-clear, TMs-wnl, NOSE-clear, THROAT-clear & wnl. NECK:  Supple w/ fairROM; no JVD; normal carotid impulses w/o bruits; no thyromegaly or nodules palpated; no lymphadenopathy. CHEST:  Clear to P & A; without wheezes/ rales/ or rhonchi. HEART:  Regular Rhythm; without murmurs/ rubs/ or gallops. ABDOMEN:  Soft & nontender; normal bowel sounds; no organomegaly or masses detected. EXT:  mod arthritic changes; no varicose veins/ +venous insuffic/ or edema. NEURO:  CN's intact;  no focal neuro deficits... DERM:  No lesions noted; no rash  etc...  RADIOLOGY DATA:  Reviewed in the EPIC EMR & discussed w/ the patient...  LABORATORY DATA:  Reviewed in the EPIC EMR & discussed w/ the patient...   Assessment & Plan:    HBP>  BP not controlled on 4 meds as listed, and we added Apresoline 25mg Bid...  CAD>  Continue ASA & secondary risk factor reduction strategy; he has seen DrNishan & DrWall in the past...  HYPERLIPIDEMIA>  On Prav40 now & FLP improved; needs better diet & wt reduction...  DM>  BS & A1c are way off & we will maximize the Metformin & Glimepiride, but he needs better diet & get the weight down...  GI> Gastritis, Divertics, Polyps>  He uses Prilosec prn; had screening colon f/u 4/11 & neg w/o polyps etc...  GU> Kidney Stones, BPH w/ LTOS, ED>  Followed by DrBorden & his note reviewed; on Flomax, uses prostaglandin shots for ED prn; PSA=2.61  DJD>  He had left knee shot from DrRowan recently; uses Aleve prn...   Patient's Medications  New Prescriptions   HYDRALAZINE (APRESOLINE) 25 MG TABLET    Take 1 tablet (25 mg total) by mouth 2 (two) times daily.  Previous Medications   AMLODIPINE (NORVASC) 10 MG TABLET    Take 1 tablet (10 mg total) by mouth daily before breakfast.   ASPIRIN EC 325 MG TABLET    Take 325 mg by mouth daily.   GLIMEPIRIDE (AMARYL) 1 MG TABLET    TAKE ONE TABLET BY MOUTH IN THE MORNING   GLUCOSE BLOOD (ONE TOUCH ULTRA TEST) TEST STRIP    Check Blood Sugars daily or as directed  DX CODE: 250.00   HYDROCODONE-ACETAMINOPHEN (NORCO/VICODIN) 5-325 MG PER TABLET    Take 2 tablets by mouth every 6 (six) hours as needed for pain.   LISINOPRIL-HYDROCHLOROTHIAZIDE (PRINZIDE,ZESTORETIC) 20-25 MG PER TABLET    Take 1 tablet by mouth daily.   METFORMIN (GLUCOPHAGE) 500 MG TABLET    Take 1 tablet (500 mg total) by mouth 2 (two) times daily with a meal.   METOPROLOL (LOPRESSOR) 50 MG TABLET    Take 1 tablet (50 mg total) by mouth 2 (two) times daily.   POTASSIUM CHLORIDE SA (K-DUR,KLOR-CON) 20 MEQ  TABLET    Take 1 tablet (20 mEq total) by mouth daily.   PRAVASTATIN (PRAVACHOL) 40 MG TABLET    Take 1 tablet (40 mg total) by mouth at bedtime.   TAMSULOSIN HCL (FLOMAX) 0.4 MG CAPS    Take 1 capsule (0.4 mg total) by mouth daily.  Modified Medications   No medications on file  Discontinued Medications   No medications on file

## 2012-03-22 NOTE — Patient Instructions (Addendum)
Today we updated your med list in our EPIC system...    Continue your current medications the same...    We decided to add another BP med to get better control>       Start APRESOLINE 25mg  - one tab twice daily; and continue your other meds the same!!!  Today we did your follow up FASTING blood work...    We will contact you w/ the results when avail...  Call for any questions...  Let's plan a follow up visit to recheck your BP in 2-3 months.Marland KitchenMarland Kitchen

## 2012-03-31 ENCOUNTER — Telehealth: Payer: Self-pay | Admitting: Pulmonary Disease

## 2012-03-31 MED ORDER — METFORMIN HCL 500 MG PO TABS: 1000.0000 mg | ORAL_TABLET | Freq: Two times a day (BID) | ORAL | Status: DC

## 2012-03-31 MED ORDER — GLIMEPIRIDE 4 MG PO TABS: 4.0000 mg | ORAL_TABLET | Freq: Every day | ORAL | Status: DC

## 2012-03-31 NOTE — Telephone Encounter (Signed)
Notes Recorded by Michele Mcalpine, MD on 03/23/2012 at 7:46 AM Please notify patient>  FLP not at goals on Prav40- continue same med for now, needs better low fat diet, incr exercise, get wt down! Chems- ok x BS=224 A1c=8.8 which is worse! Rec- incr Metform to max dose 2Bid #120, and incr Glimep to 4mg  Qam #30... CBC, PSA= wnl... --  I spoke with patient about results and he verbalized understanding and had no questions. RX's have been sent to the pharmacy. Nothing further was needed

## 2012-04-01 ENCOUNTER — Telehealth: Payer: Self-pay | Admitting: Pulmonary Disease

## 2012-04-01 NOTE — Telephone Encounter (Signed)
Pt aware of results. Nothing further was needed

## 2012-04-14 ENCOUNTER — Other Ambulatory Visit: Payer: Medicare Other

## 2012-04-28 ENCOUNTER — Telehealth: Payer: Self-pay | Admitting: Pulmonary Disease

## 2012-04-28 NOTE — Telephone Encounter (Signed)
lmomtcb x1 

## 2012-04-29 MED ORDER — TAMSULOSIN HCL 0.4 MG PO CAPS: 0.4000 mg | ORAL_CAPSULE | Freq: Every day | ORAL | Status: DC

## 2012-04-29 NOTE — Telephone Encounter (Signed)
Spoke with pt to verify the msg Rx was sent to [facility] for 90 days supply flomax Nothing further needed per pt

## 2012-04-29 NOTE — Telephone Encounter (Signed)
LMTCB

## 2012-05-03 ENCOUNTER — Other Ambulatory Visit: Payer: Self-pay | Admitting: Pulmonary Disease

## 2012-05-19 ENCOUNTER — Encounter: Payer: Self-pay | Admitting: Internal Medicine

## 2012-06-18 ENCOUNTER — Encounter: Payer: Self-pay | Admitting: Pulmonary Disease

## 2012-06-18 ENCOUNTER — Ambulatory Visit (INDEPENDENT_AMBULATORY_CARE_PROVIDER_SITE_OTHER): Payer: Medicare Other | Admitting: Pulmonary Disease

## 2012-06-18 ENCOUNTER — Other Ambulatory Visit (INDEPENDENT_AMBULATORY_CARE_PROVIDER_SITE_OTHER): Payer: Medicare Other

## 2012-06-18 VITALS — BP 152/84 | HR 66 | Temp 98.1°F | Ht 72.0 in | Wt 233.0 lb

## 2012-06-18 DIAGNOSIS — E041 Nontoxic single thyroid nodule: Secondary | ICD-10-CM

## 2012-06-18 DIAGNOSIS — K5730 Diverticulosis of large intestine without perforation or abscess without bleeding: Secondary | ICD-10-CM

## 2012-06-18 DIAGNOSIS — I10 Essential (primary) hypertension: Secondary | ICD-10-CM

## 2012-06-18 DIAGNOSIS — D126 Benign neoplasm of colon, unspecified: Secondary | ICD-10-CM

## 2012-06-18 DIAGNOSIS — E785 Hyperlipidemia, unspecified: Secondary | ICD-10-CM

## 2012-06-18 DIAGNOSIS — N401 Enlarged prostate with lower urinary tract symptoms: Secondary | ICD-10-CM

## 2012-06-18 DIAGNOSIS — F411 Generalized anxiety disorder: Secondary | ICD-10-CM

## 2012-06-18 DIAGNOSIS — I2510 Atherosclerotic heart disease of native coronary artery without angina pectoris: Secondary | ICD-10-CM

## 2012-06-18 DIAGNOSIS — M1990 Unspecified osteoarthritis, unspecified site: Secondary | ICD-10-CM

## 2012-06-18 LAB — BASIC METABOLIC PANEL
Calcium: 10.1 mg/dL (ref 8.4–10.5)
GFR: 65.15 mL/min (ref >60.00)
Sodium: 139 mEq/L (ref 135–145)

## 2012-06-18 LAB — HEMOGLOBIN A1C: Hgb A1c MFr Bld: 7.2 % — ABNORMAL HIGH (ref 4.6–6.5)

## 2012-06-18 MED ORDER — METFORMIN HCL 500 MG PO TABS: 1000.0000 mg | ORAL_TABLET | Freq: Two times a day (BID) | ORAL | Status: DC

## 2012-06-18 MED ORDER — PRAVASTATIN SODIUM 40 MG PO TABS: 40.0000 mg | ORAL_TABLET | Freq: Every day | ORAL | Status: DC

## 2012-06-18 MED ORDER — GLIMEPIRIDE 4 MG PO TABS: 4.0000 mg | ORAL_TABLET | Freq: Every day | ORAL | Status: DC

## 2012-06-18 NOTE — Patient Instructions (Addendum)
Today we updated your med list in our EPIC system...    Continue your current medications the same...    We refilled your METFORMIN for 1000mg  twice daily...    We wrote a new prescription for the PRAVASTATIN 40mg - one tab daily at bedtime...  Today we checked your follow up Diabetic labs... .Marland KitchenMarland KitchenWe will contact you w/ the results when available...   Call for any questions...  Let's plan a follow up visit in 58mo, sooner if needed for problems.Marland KitchenMarland Kitchen

## 2012-06-18 NOTE — Progress Notes (Signed)
Subjective:    Patient ID: [name], male    DOB: Dec 24, 1934, 78 y.o.   MRN: [id number]  HPI 78 y/o WM here for a follow up visit... he has multiple medical problems as noted below... Followed for general medical purposes w/ HBP, CAD, Hyperlipidemia, DM, DJD, etc... also Mathews DrWall for Cards, DrPerry for GI (Gastritis, Divertics, Polyps), & DrBorden for Urology (Hx Kidney stones & BPH)...   ~  September 16, 2010:  49mo ROV> generally feeling well, hx severe DJD in kness as noted, "I get plenty of exercise", had shot in knee recently by [facility], takes Aleve prn...    He saw DrBorden 3/12 for f/u stones, BPH w/ LUTS, ED & PSA screening> doing well on Flomax, PSA= 2.0, he wanted to do 24H urine but pt declined.    Noted small lump in ant neck 7/12 & saw TP; Thyroid Sonar showed 3 sm (<54mm) thyr cysts- the palp lesion is in left SCM muscle w/ benign features; we will follow clinically- no changes noted.    LABS 7/12 reviewed: BS=142 (A1c today= 8.4); other Chems/ CBC/ TFTs> all wnl; REC- add GLIMEPIRIDE 1mg /d w/ A1c continuing to rise...  ~  March 19, 2011:  49mo ROV & he reports a good interval, notes that his home BS checks have all been good, brother adm to NH w/ Alz...    <HBP> controlled on Metop50Bid, Amlod10, LisinHCT 20-12.5, & K20/d; BP= 134/82 & he denies CP, palpit, dizzy, SOB, edema, etc...    <CAD> known non-obstructive CAD followed by DrWall/Nishan & doing well w/o CP or other symptoms...    <Hyperlipid> on diet alone and FLP today shows TChol 182, TG 178, HDL 34, LDL 112; needs better low fat diet & wt reduction...    <DM> on Metform500Bid + Glimep1mg /d; states BS at home all 100-110 but FBS today= 160 & A1c=7.8; may need incr dose, for now get wt down!    <GI- Gastritis, Divertics, Polyps> stable & doing well on Prilosec prn & overdue for colon screen & he will call...    <GU- BPH, KidStones> on Flomax & doing well w/o signif urinary symptoms...    <DJD> followed by Vinetta Bergamo on  OTC meds andhe notes shots help some...  ~  September 17, 2011:  49mo ROV & [name] has had a time w/ kidney stones> went to ER 3/13 w/ left flank pain, found to have obstructive uropathy, seen by DrNesi/ Laverle Patter- cysto, stent placed, lithotripsy, then stent removed; stone analysis was calcium oxalate; voiding satis on Flomax & they discussed dietary adjustments; he has stable stone in right renal pelvis being followed...     BP controlled on Metop, Amlodip, Lisin/Hct; BP= 152/88 today & he denies CP, palpit, SOB, edema, etc...     Chol has been fair on diet alone & he has repeatedly refused med rx...    DM w/ fair regulation on Metform+Glimep but he c/o low sugars despite A1c in the mid 7's; see labs below...    He has DJD but managing satis on OTC analgesics... We reviewed prob list, meds, xrays and labs> see below for updates >> LABS 8/13:  Chems- ok x BS=163;  A1c=7.6  ~  March 22, 2012:  49mo ROV & [name] says he's feeling well- no new complaints or concerns; but he was in the ER 1/14 w/ CP & left arm pain> had Lexiscan Myoview which showed some apical thinning, no ischemia, +global LV dysfunction w/ EF=43% & global HK;  He  had f/u [facility] 1/14 & his BP was 160/80 & they adjusted his meds- added Prav40 & increased LisinoprilHCT to 20-25    HBP> on Metop50Bid, Amlod10, LisinHCT 20-25, K20/d; BP= 180/90 & he denies further CP, palpit, dizzy, SOB, edema; we decided to ADD APRESOLINE 25mg Bid.    CAD> on ASA325; known non-obstructive CAD (cath 2004) followed by DrWall/Nishan w/ ER visit 1/14 & Myoview as above...    Hyperlipid> prev on diet alone but Prav40 was added 1/14 by Cards; FLP shows TChol 173, TG 178, HDL 33, LDL 104; Rec- continue same, better diet...    DM> on Metform500Bid + Glimep1mg /d; labs showed BS= 224, A1c= 8.8; Rec- incr Metform-2Bid, Glimep4, + better diet & ROV 32mo...    GI- Gastritis, Divertics, Polyps> stable & doing well on Prilosec prn; Last colon 4/11 showed divertics, hems,  ?evid of prior sigm resection? (he never had colon surg)...    GU- BPH, KidStones> on Flomax & doing well w/o signif urinary symptoms; last stone & lithotripsy was 3/13...    DJD> followed by Vinetta Bergamo on Hydrocodone and he notes it is hard to exercise due to his knees... We reviewed prob list, meds, xrays and labs> see below for updates >> he had the Flu shot 10/13 & TDAP 8/13... EKG 1/14 showed NSR, rate61, inferior scar, NSSTTWA... LABS 2/14:  FLP- not at goals on Prav40, needs beeter diet, get wt down;  Chems- ok x BS=224 A1c=8.8;  CBC- wnl;  PSA=2.61...  ~  Jun 18, 2012:  32mo ROV and [name] is brought back to recheck his BP & DM labs after adjusting his meds in Feb>>    HBP> on Metop50Bid, Amlod10, LisinHCT 20-25, K20/d & Apresoline25Bid added; BP= 152/84 & he denies CP, palpit, dizzy, SOB, edema; he did not bring med bottles or a list & we reviewed his & importance of taking them every day!    CAD> on ASA325; known non-obstructive CAD (cath 2004) followed by DrWall/Nishan w/ ER visit 1/14 & Myoview which showed some apical thinning, no ischemia, +global LV dysfunction w/ EF=43% & global HK...    Hyperlipid> prev on diet alone but Prav40 was added 1/14 by Cards; FLP 2/14 showed TChol 173, TG 178, HDL 33, LDL 104; Rec- continue same, better diet...    DM> on Metform500-2Bid + Glimep4mg /d; labs today show BS= 123, A1c= 7.2; Improved w/ these 2 meds maximized... We reviewed prob list, meds, xrays and labs> see below for updates >>  LABS 5/14:  Chems- ok x BS=123, A1c=7.2 (Improved)...          Problem List:  HEARING LOSS - he also Mathews DrShoemaker w/ hearing eval 7/09 & new hearing aides...  HYPERTENSION (ICD-401.9) - controlled on METOPROLOL 50mg Bid, AMOLDIPINE 10mg /d, LISINOPRIL/HCT 20-25, KCl 20/d & APRESOLINE 25mg Bid added 2/14...   ~  3/10:  Metoprolol 50mg  Bid added to his regimen. ~  2/12:  he was using an OLD MED LIST (which we discarded) & we reviewed his 4 med regimen & asked him  to monitor BP at home. ~  2/13:  BP= 134/82> he knows to follow a low salt diet and needs to lose wt; he is asymtomatic- denies HA, fatigue, visual changes, CP, palipit, dizziness, syncope, dyspnea, edema, etc... ~  CXR 3/13 showed cardiomeg, lower lung vols, clear/ NAD.Marland Kitchen. ~  8/13:  BP= 152/88 & needs to take meds regularly, get wt down... ~  1/14:  BP was 160/80 & Cards incr the LisinHCT to 20-25 daily... ~  2/14: on Metop50Bid, Amlod10, LisinHCT 20-25, K20/d; BP= 180/90 & he denies further CP, palpit, dizzy, SOB, edema; we decided to ADD APRESOLINE 25mg Bid. ~  5/14: on Metop50Bid, Amlod10, LisinHCT 20-25, K20/d & Apresoline25Bid added; BP= 152/84 & he denies CP, palpit, dizzy, SOB, edema; he did not bring med bottles or a list & we reviewed his & importance of taking them every day!  CAD (ICD-414.00) - Hx non-obstructive disease on cath 6/04 w/ 50%3rdDiag branch of LAD, 40%CIRC, 30%distalRCA... he takes ASA 81mg  daily and practicing secondary risk factor reduction strategy...  currently w/o CP, palpit, change in breathing, etc... last seen by DrWall 4/10> stable, no changes made. ~  3/10:  Myoview showed mild global HK, EF= 47%, apic thinning vs sm infarct & ?mild ischemia. ~  7/11:  no CP, palpit, SOB, etc... he is active- walking, yard, etc... ~  9/11:  adm by [facility] w/ atypCP, seen by Walker Kehr & no further testing felt necessary. ~  1/14: he went to ER 1/14 w/ CP & left arm pain> Lexiscan Myoview which showed some apical thinning, no ischemia, +global LV dysfunction w/ EF=43% & global HK.  HYPERLIPIDEMIA, BORDERLINE (ICD-272.4) - prev on diet alone, bur Cards 1/14 added PRAVASTATIN40...  ~  FLP 12/07 showing TChol 157, TG 137, HDL 35, LDL 95 ~  FLP 1/09 showed TChol 170, TG 144, HDL 32, LDL 109 ~  FLP 7/09 (wt= 233#) showed TChol 158, TG 162, HDL 31, LDL 95 ~  FLP in hosp 3/10 showed TChol 154, TG 177, HDL 25, LDL 94... he doesn't want Fibrate Rx. ~  FLP 7/10 showed TChol 160, TG 162, HDL  30, LDL 98... still refuses med Rx... ~  FLP 1/11 showed TChol 174, TG 170, HDL 36, LDL 104 ~  FLP 7/11 showed TChol 161, TG 192, HDL 34, LDL 88 ~  FLP 2/12 showed TChol 166, TG 273, HDL 31, LDL 103... worse TG- may need Fibrate if wt not coming down! ~  FLP 2/13 on diet alone showed TChol 182, TG 178, HDL 34, LDL 112... He refuses med rx. ~  FLP 2/14 on Prav40 x19mo showed TChol 173, TG 178, HDL 33, LDL 104... Rec- continue Rx & diet.  AODM (ICD-250.00) - on METFORMIN 500mg Bid + GLIMEPIRIDE 1mg /d (added 8/12); discussed weight reduction strategies... ~  in 2008 BS=127, & HgA1c=6.5 on Metformin 500Bid... pt decr to 1/d on his own. ~  labs 1/09 showed BS= 143, HgA1c= 6.8.Marland Kitchen. ~  labs 7/09 (wt= 233#) showed BS= 136, HgA1c= 6.9.Marland KitchenMarland Kitchen rec- OK on Metform500/d, needs better diet. ~  labs 2/10 (wt= 242#) showed BS= 171, HgA1c= 6.9.Marland Kitchen. rec> get wt down. ~  labs 7/10 (wt= 240#) showed BS= 135, A1c= 7.0.Marland KitchenMarland Kitchen may need additional med. ~  labs 1/11 (wt=245#) showed BS= 149, A1c= 7.1 ~  labs 7/11 (wt= 226#) showed BS= 146, A1c= 7.3 ~  labs 2/12 (wt=236#) showed BS= 151, A1c= 7.7.Marland Kitchen. rec> incr METFORM500Bid & get wt down. ~  Labs 8/12 (wt=232#) showed BS= 142, A1c= 8.4... Rec> add GLIMEPIRIDE 1mg /d. ~  Labs 2/13 (wt=241#) showed BS=160, A1c= 7.8.Marland KitchenMarland Kitchen Must get wt down or incr meds... ~  Labs 8/13 (wt=232#) showed BS=163, A1c= 7.6.Marland KitchenMarland Kitchen Continue same. ~  Labs 2/14 (wt=241#) showed BS= 224, A1c= 8.8.Marland KitchenMarland Kitchen Rec- incr Metform500-2Bid & Glimep4/d... ~  5/14: on Metform500-2Bid + Glimep4mg /d; labs today show BS= 123, A1c= 7.2; Improved w/ these 2 meds maximized.   Hx of GASTRITIS (ICD-535.50) - on PRILOSEC 20mg  OTC Prn... ~  EGD  2/09 by DrDBrodie showed 2cmHH, mod esophagitis, duodenitis... Rx'd ProtonixBid.  DIVERTICULOSIS OF COLON (ICD-562.10) >> COLONIC POLYPS (ICD-211.3) >> ~  Colonoscopy 9/05 by DrPerry showed divertics & diminutive polyp... f/u planned 5 years. ~  Colonoscopy 4/11 showed mod divertics, evid of prior  sigmoid resection (?- we don't have hx of this), int hems... ~  CT Abd 3/13> acute obstructive uropathy w/ left ureteral stone; another stone in right renal pelvis, 22mm hyperdense left upper pole renal lesion ?hemorrhagic cyst, enlarged prostate, hepatic steatosis, diverticulosis...  HYPERTROPHY PROSTATE W/UR OBST & OTH LUTS (ICD-600.01) RENAL CALCULUS (ICD-592.0) - on FLOMAX 0.4mg /d per Urology & followed for BPH, Urolithiasis, ED & PSA screening purposes. ~  he has prev required cystoscopy w/ stents, & prev had lithotripsy as well...  ~  labs 7/10 showed PSA= 2.09, & subseq labs by DrRDavis (we don't have his notes). ~  11/11:  seen by DrBorden & passed an 8mm right ureteral stone, XRays show bilat nonobstructing stones in kidneys. ~  3/12:  Continued f/u w/ DrBorden & PSA= 2.0 ~  2/13:  PSA= 2.57 & he is voiding satis ~  3/13:  ER 3/13 w/ left flank pain, found to have obstructive uropathy, seen by DrNesi/ Laverle Patter- cysto, stent placed, lithotripsy, then stent removed; stone analysis was calcium oxalate; voiding satis on Flomax & they discussed dietary adjustments; he has stable stone in right renal pelvis being followed...   Hx of PYELONEPHRITIS (ICD-590.80) - Adm 4/08 w/ pyelo from obstructing R kidney stone requiring cysto, stent, laser fragmentation...  DEGENERATIVE JOINT DISEASE (ICD-715.90) - he uses OTC meds w/ relief... followed by Sherin Quarry w/ "endstage" DJD in knees (left worse than right) & told he needs TKRs- he is determined to "keep them a little bit longer"...  ANXIETY (ICD-300.00) - his wife passed away 07-04-05 & he has been enjoying fishing, hunting, and taking care of his 46 y/o grandson.   Past Surgical History  Procedure Laterality Date  . Appendectomy    . Cataract extraction    . Knee surgery      bilateral  . Shoulder surgery  04/2005    left by Dr. Luiz Blare  . S/p elap 07-04-84 w/excision of leiomyoma @ ge junction, meckle's divertic resected, & cholecystectomy    . S/p  cysto & stents for kidnedy stone 11/08 by dr. Wanda Plump    . Cystoscopy  04/17/11    stent placed  . Cystoscopy w/ ureteral stent placement  04/17/2011    Procedure: CYSTOSCOPY WITH RETROGRADE PYELOGRAM/URETERAL STENT PLACEMENT;  Surgeon: Lindaann Slough, MD;  Location: [facility];  Service: Urology;  Laterality: Left;    Outpatient Encounter Prescriptions as of 06/18/2012  Medication Sig Dispense Refill  . amLODipine (NORVASC) 10 MG tablet Take 1 tablet (10 mg total) by mouth daily before breakfast.  90 tablet  3  . aspirin EC 325 MG tablet Take 325 mg by mouth daily.      Marland Kitchen glimepiride (AMARYL) 4 MG tablet Take 1 tablet (4 mg total) by mouth daily before breakfast.  30 tablet  3  . glucose blood (ONE TOUCH ULTRA TEST) test strip Check Blood Sugars daily or as directed  DX CODE: 250.00  100 each  5  . hydrALAZINE (APRESOLINE) 25 MG tablet Take 1 tablet (25 mg total) by mouth 2 (two) times daily.  60 tablet  6  . HYDROcodone-acetaminophen (NORCO/VICODIN) 5-325 MG per tablet Take 2 tablets by mouth every 6 (six) hours as needed for pain.  6 tablet  0  . KLOR-CON M20 20 MEQ tablet TAKE ONE TABLET BY MOUTH EVERY DAY  90 tablet  3  . lisinopril-hydrochlorothiazide (PRINZIDE,ZESTORETIC) 20-25 MG per tablet Take 1 tablet by mouth daily.  30 tablet  11  . metFORMIN (GLUCOPHAGE) 500 MG tablet Take 2 tablets (1,000 mg total) by mouth 2 (two) times daily with a meal.  120 tablet  3  . metoprolol (LOPRESSOR) 50 MG tablet Take 1 tablet (50 mg total) by mouth 2 (two) times daily.  180 tablet  3  . tamsulosin (FLOMAX) 0.4 MG CAPS Take 1 capsule (0.4 mg total) by mouth daily.  90 capsule  3  . pravastatin (PRAVACHOL) 40 MG tablet Take 1 tablet (40 mg total) by mouth at bedtime.  30 tablet  11   No facility-administered encounter medications on file as of 06/18/2012.    Allergies  Allergen Reactions  . Codeine Phosphate     REACTION: nausea  . Morphine     REACTION: nausea    Current Medications, Allergies,  Past Medical History, Past Surgical History, Family History, and Social History were reviewed in [employer] record.    Review of Systems    See HPI - all other systems neg except as noted...  The patient complains of decreased hearing, dyspnea on exertion, and difficulty walking.  The patient denies anorexia, fever, weight loss, weight gain, vision loss, hoarseness, chest pain, syncope, peripheral edema, prolonged cough, headaches, hemoptysis, abdominal pain, melena, hematochezia, severe indigestion/heartburn, hematuria, incontinence, muscle weakness, suspicious skin lesions, transient blindness, depression, unusual weight change, abnormal bleeding, enlarged lymph nodes, and angioedema.     Objective:   Physical Exam    WD, Overweight, 78 y/o WM in NAD... GENERAL:  Alert & oriented; pleasant & cooperative... HEENT:  [place]/AT, EOM-wnl, PERRLA, EACs-clear, TMs-wnl, NOSE-clear, THROAT-clear & wnl. NECK:  Supple w/ fairROM; no JVD; normal carotid impulses w/o bruits; no thyromegaly or nodules palpated; no lymphadenopathy. CHEST:  Clear to P & A; without wheezes/ rales/ or rhonchi. HEART:  Regular Rhythm; without murmurs/ rubs/ or gallops. ABDOMEN:  Soft & nontender; normal bowel sounds; no organomegaly or masses detected. EXT:  mod arthritic changes; no varicose veins/ +venous insuffic/ or edema. NEURO:  CN's intact;  no focal neuro deficits... DERM:  No lesions noted; no rash etc...  RADIOLOGY DATA:  Reviewed in the EPIC EMR & discussed w/ the patient...  LABORATORY DATA:  Reviewed in the EPIC EMR & discussed w/ the patient...   Assessment & Plan:    HBP>  BP better controlled on current meds as listed, pt cautioned about compliance- bring med bottles to each office f/u visit...  CAD>  Continue ASA & secondary risk factor reduction strategy; he has seen DrNishan & DrWall in the past...  HYPERLIPIDEMIA>  On Prav40 now & FLP improved; needs better diet & wt  reduction...  DM>  BS & A1c are much improved w/ the maximized Metformin & Glimepiride, continue same...  GI> Gastritis, Divertics, Polyps>  He uses Prilosec prn; had screening colon f/u 4/11 & neg w/o polyps etc...  GU> Kidney Stones, BPH w/ LTOS, ED>  Followed by DrBorden & his note reviewed; on Flomax, uses prostaglandin shots for ED prn; PSA=2.61  DJD>  He had left knee shot from DrRowan recently; uses Aleve prn...   Patient's Medications  New Prescriptions   No medications on file  Previous Medications   AMLODIPINE (NORVASC) 10 MG TABLET    Take 1 tablet (10 mg total) by mouth  daily before breakfast.   ASPIRIN EC 325 MG TABLET    Take 325 mg by mouth daily.   GLUCOSE BLOOD (ONE TOUCH ULTRA TEST) TEST STRIP    Check Blood Sugars daily or as directed  DX CODE: 250.00   HYDRALAZINE (APRESOLINE) 25 MG TABLET    Take 1 tablet (25 mg total) by mouth 2 (two) times daily.   HYDROCODONE-ACETAMINOPHEN (NORCO/VICODIN) 5-325 MG PER TABLET    Take 2 tablets by mouth every 6 (six) hours as needed for pain.   KLOR-CON M20 20 MEQ TABLET    TAKE ONE TABLET BY MOUTH EVERY DAY   LISINOPRIL-HYDROCHLOROTHIAZIDE (PRINZIDE,ZESTORETIC) 20-25 MG PER TABLET    Take 1 tablet by mouth daily.   METOPROLOL (LOPRESSOR) 50 MG TABLET    Take 1 tablet (50 mg total) by mouth 2 (two) times daily.   TAMSULOSIN (FLOMAX) 0.4 MG CAPS    Take 1 capsule (0.4 mg total) by mouth daily.  Modified Medications   Modified Medication Previous Medication   GLIMEPIRIDE (AMARYL) 4 MG TABLET glimepiride (AMARYL) 4 MG tablet      Take 1 tablet (4 mg total) by mouth daily before breakfast.    Take 1 tablet (4 mg total) by mouth daily before breakfast.   METFORMIN (GLUCOPHAGE) 500 MG TABLET metFORMIN (GLUCOPHAGE) 500 MG tablet      Take 2 tablets (1,000 mg total) by mouth 2 (two) times daily with a meal.    Take 2 tablets (1,000 mg total) by mouth 2 (two) times daily with a meal.   PRAVASTATIN (PRAVACHOL) 40 MG TABLET pravastatin  (PRAVACHOL) 40 MG tablet      Take 1 tablet (40 mg total) by mouth at bedtime.    Take 1 tablet (40 mg total) by mouth at bedtime.  Discontinued Medications   No medications on file

## 2012-06-24 ENCOUNTER — Telehealth: Payer: Self-pay | Admitting: Pulmonary Disease

## 2012-06-24 NOTE — Telephone Encounter (Signed)
Notes Recorded by Marcellus Scott, CMA on 06/23/2012 at 1:52 PM [email or web address] ------  Notes Recorded by Michele Mcalpine, MD on 06/18/2012 at 4:05 PM Please notify patient>  Chems show BS=123, A1c=7.2> improved on Metform1000Bid & Glim4, continue same!  Pt is aware of results. Carron Curie, CMA

## 2012-07-09 ENCOUNTER — Other Ambulatory Visit: Payer: Self-pay | Admitting: Urology

## 2012-07-13 ENCOUNTER — Encounter (HOSPITAL_COMMUNITY): Payer: Self-pay | Admitting: Pharmacy Technician

## 2012-07-16 ENCOUNTER — Encounter (HOSPITAL_COMMUNITY): Payer: Self-pay | Admitting: *Deleted

## 2012-07-16 NOTE — H&P (Signed)
History of Present Illness  [name] is a 78 year old who has been previously followed by Dr. Wanda Plump and Dr. Earlene Plater and has the following urologic history:  1) Urolithiasis: He has a history of calcium oxalate nephrolithiasis and has previously been treated by [facility]. Mar 2013: L [facility] for proximal ureteral stone  2) BPH/LUTS: Current treatment: Flomax 0.4 mg (prescribed by Dr. [name])  3) Erectile dysfunction: He has had good response to PDE-5 inhibitors but has had significant side effects including myalgias. Current treatment: PGE 10 mcg  4) Prostate cancer screening: Family history: None Last PSA: 2.93 (May 2013)  Interval history:  He follows up today for further evaluation of his right renal calculus, history of BPH, and for prostate cancer screening. He continues to take Flomax 0.4 mg nightly with no subjective change in his symptoms. He continues to be quite pleased with how he voids. He has been asymptomatic from his right renal calculus and specifically has denied any hematuria or flank pain.   Past Medical History Problems  1. History of  Diabetes Mellitus 250.00 2. History of  Hypertension 401.9  Surgical History Problems  1. History of  Cystoscopy With Insertion Of Ureteral Stent Left 2. History of  Cystoscopy With Insertion Of Ureteral Stent Right 3. History of  Lithotripsy 4. History of  Lithotripsy  Current Meds 1. AmLODIPine Besylate 10 MG Oral Tablet; Therapy: 02Nov2010 to 2. Aspirin 325 MG Oral Tablet; TAKE 1 TABLET DAILY; Therapy: (Recorded:30Sep2011) to 3. Glimepiride 1 MG Oral Tablet; Therapy: 14Aug2012 to 4. HYDROmorphone HCl 4 MG Oral Tablet; Therapy: 07Mar2013 to 5. Lisinopril-Hydrochlorothiazide 20-12.5 MG Oral Tablet; Therapy: (Recorded:25Apr2008) to 6. MetFORMIN HCl 500 MG Oral Tablet; Therapy: (Recorded:25Apr2008) to 7. Metoprolol Tartrate 50 MG Oral Tablet; Therapy: 03Feb2012 to 8. Ondansetron HCl 4 MG Oral Tablet; TAKE 1 TABLET Every 8 hours  PRN; Therapy: 11Mar2013 to  (Last Rx:11Mar2013)  Requested for: 11Mar2013 9. Potassium Chloride Crys ER 20 MEQ Oral Tablet Extended Release; Therapy: 03Feb2012 to 10. Prostaglandin E1 20ug/mL; INJECT AS DIRECTED AS NEEDED.  DO NOT   USE MORE THAN   EVERY OTHER DAYOR 3 TIMESPER WEEK; Therapy: 14Apr2011 to (Evaluate:09Aug2013)    Requested for: 15Apr2013; Last Rx:11Apr2013 11. Tamsulosin HCl 0.4 MG Oral Capsule; Therapy: 03Feb2012 to 12. Vicodin 5-300 MG Oral Tablet; TAKE 1 TO 2 TABLETS EVERY 4 TO 6 HOURS AS NEEDED FOR   PAIN; Therapy: 14Mar2013 to (Evaluate:19Mar2013); Last Rx:14Mar2013  Allergies Medication  1. Codeine Derivatives 2. Morphine Derivatives  Family History Problems  1. Family history of  Family Health Status Number Of Children 1 son and 1 daughter  Social History Problems  1. Marital History - Currently Married 2. Never A Smoker Denied  3. History of  Alcohol Use 4. History of  Caffeine Use 5. History of  Former Smoker  Review of Systems  Genitourinary: no hematuria.  Constitutional: no recent weight loss.  Cardiovascular: no chest pain and no leg swelling.  Respiratory: no shortness of breath.    Vitals Vital Signs [Data Includes: Last 1 Day]  27May2014 10:37AM  BMI Calculated: 31.15 BSA Calculated: 2.26 Height: 6 ft  Weight: 230 lb  Blood Pressure: 169 / 70 Temperature: 98.5 F Heart Rate: 71  Physical Exam Constitutional: Well nourished and well developed . No acute distress.  ENT:. The ears and nose are normal in appearance.  Neck: The appearance of the neck is normal and no neck mass is present.  Pulmonary: No respiratory distress, normal respiratory rhythm and effort  and clear bilateral breath sounds.  Cardiovascular: Heart rate and rhythm are normal . No peripheral edema.  Abdomen: No CVA tenderness.  Rectal: Rectal exam demonstrates normal sphincter tone, no tenderness and no masses. Prostate size is estimated to be g. The prostate has no  nodularity and is not tender. The left seminal vesicle is nonpalpable. The right seminal vesicle is nonpalpable. The perineum is normal on inspection.  Skin: Normal skin turgor, no visible rash and no visible skin lesions.  Neuro/Psych:. Mood and affect are appropriate.    Results/Data Urine [Data Includes: Last 1 Day]   27May2014  COLOR YELLOW   APPEARANCE CLEAR   SPECIFIC GRAVITY 1.025   pH 6.0   GLUCOSE NEG mg/dL  BILIRUBIN NEG   KETONE NEG mg/dL  BLOOD NEG   PROTEIN NEG mg/dL  UROBILINOGEN 0.2 mg/dL  NITRITE NEG   LEUKOCYTE ESTERASE NEG   Selected Results  PSA REFLEX TO FREE 20May2014 10:19AM [name]  SPECIMEN TYPE: BLOOD   Test Name Result Flag Reference  PSA 2.48 ng/mL  <=4.00  TEST METHODOLOGY: ECLIA PSA (ELECTROCHEMILUMINESCENCE IMMUNOASSAY)     I independently reviewed his [facility] x-ray. This demonstrates interval enlargement of his right renal calculus which now appears to measure 11 mm in largest diameter compared to 8 mm one year ago.  PVR: 95 cc  Assessment Assessed  1. Visit For: Screening Exam Malignant Neoplasm Prostate V76.44 2. Benign Prostatic Hyperplasia Localized With Urinary Obstruction With Other Lower Urinary Tract  Symptoms 600.21 3. Nephrolithiasis 592.0 4. 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 UA With REFLEX  Done: 27May2014 09:45AM Nephrolithiasis (592.0)  2. Follow-up Office  Follow-up  Requested for: 27May2014  Discussion/Summary  1. Right renal calculus: There has been interval enlargement in his right renal calculus. We discussed options including continued observation versus definitive treatment with either ureteroscopic laser lithotripsy or shockwave lithotripsy. We also discussed the alternative of percutaneous nephrolithotomy. After discussion of the pros and cons of each approach, he does wish to proceed with proactive therapy and does wish to go forward with shockwave lithotripsy. He had a very good result  from this procedure last year and is interested in treating this before he becomes symptomatic or before his stone in largest to the point where shockwave lithotripsy would be a less attractive option. We have reviewed the potential risks, complications, alternative options, and expected recovery process. He gives his informed consent to proceed. He will stop his aspirin prior to treatment.  2. BPH/LUTS: He will continue current therapy with tamsulosin 0.4 mg nightly. He will follow up in one year with a PVR/IPSS questionnaire.  3. Prostate cancer screening:  we reviewed his PSA which remained stable.  considering that his life expectancy would be approximately 10 years at this point, we have discussed discontinuation of PSA screen in proceeding with annual digital rectal exams. He is very agreeable to this approach.  4. Erectile dysfunction: He will continue therapy with Trimix p.r.n.  CC: Dr. Alroy Dust     Signatures Electronically signed by : [name], M.D.; Jul 06 2012 11:02AM

## 2012-07-19 ENCOUNTER — Ambulatory Visit (HOSPITAL_COMMUNITY): Payer: Medicare Other

## 2012-07-19 ENCOUNTER — Encounter (HOSPITAL_COMMUNITY)
Admission: RE | Disposition: A | Discharge status: Home / Self Care | Payer: Self-pay | Source: Ambulatory Visit | Attending: Urology

## 2012-07-19 ENCOUNTER — Ambulatory Visit (HOSPITAL_COMMUNITY)
Admission: RE | Admit: 2012-07-19 | Discharge: 2012-07-19 | Disposition: A | Discharge status: Home / Self Care | Payer: Medicare Other | Source: Ambulatory Visit | Attending: Urology | Admitting: Urology

## 2012-07-19 ENCOUNTER — Encounter (HOSPITAL_COMMUNITY): Payer: Self-pay

## 2012-07-19 DIAGNOSIS — N529 Male erectile dysfunction, unspecified: Secondary | ICD-10-CM | POA: Insufficient documentation

## 2012-07-19 DIAGNOSIS — N200 Calculus of kidney: Secondary | ICD-10-CM | POA: Insufficient documentation

## 2012-07-19 DIAGNOSIS — Z7982 Long term (current) use of aspirin: Secondary | ICD-10-CM | POA: Insufficient documentation

## 2012-07-19 DIAGNOSIS — E119 Type 2 diabetes mellitus without complications: Secondary | ICD-10-CM | POA: Insufficient documentation

## 2012-07-19 DIAGNOSIS — N138 Other obstructive and reflux uropathy: Secondary | ICD-10-CM | POA: Insufficient documentation

## 2012-07-19 DIAGNOSIS — I10 Essential (primary) hypertension: Secondary | ICD-10-CM | POA: Insufficient documentation

## 2012-07-19 DIAGNOSIS — Z79899 Other long term (current) drug therapy: Secondary | ICD-10-CM | POA: Insufficient documentation

## 2012-07-19 DIAGNOSIS — N401 Enlarged prostate with lower urinary tract symptoms: Secondary | ICD-10-CM | POA: Insufficient documentation

## 2012-07-19 DIAGNOSIS — Z87442 Personal history of urinary calculi: Secondary | ICD-10-CM | POA: Insufficient documentation

## 2012-07-19 LAB — GLUCOSE, CAPILLARY
Glucose-Capillary: 171 mg/dL — ABNORMAL HIGH (ref 70–99)
Glucose-Capillary: 164 mg/dL — ABNORMAL HIGH (ref 70–99)

## 2012-07-19 SURGERY — LITHOTRIPSY, ESWL
Anesthesia: LOCAL | Laterality: Right

## 2012-07-19 MED ORDER — DIPHENHYDRAMINE HCL 25 MG PO CAPS
25.0000 mg | ORAL_CAPSULE | ORAL | Status: AC
  Administered 2012-07-19: 25 mg via ORAL
  Filled 2012-07-19: qty 1

## 2012-07-19 MED ORDER — DEXTROSE-NACL 5-0.45 % IV SOLN
INTRAVENOUS | Status: DC
  Administered 2012-07-19: 16:00:00 via INTRAVENOUS

## 2012-07-19 MED ORDER — CIPROFLOXACIN HCL 500 MG PO TABS
500.0000 mg | ORAL_TABLET | ORAL | Status: AC
  Administered 2012-07-19: 500 mg via ORAL
  Filled 2012-07-19: qty 1

## 2012-07-19 MED ORDER — DIAZEPAM 5 MG PO TABS
10.0000 mg | ORAL_TABLET | ORAL | Status: AC
  Administered 2012-07-19: 10 mg via ORAL
  Filled 2012-07-19: qty 2

## 2012-07-19 NOTE — Op Note (Signed)
Please see paper chart for operative note.

## 2012-10-10 ENCOUNTER — Other Ambulatory Visit: Payer: Self-pay | Admitting: Pulmonary Disease

## 2012-10-11 ENCOUNTER — Other Ambulatory Visit: Payer: Self-pay | Admitting: Pulmonary Disease

## 2012-10-31 ENCOUNTER — Other Ambulatory Visit: Payer: Self-pay | Admitting: Pulmonary Disease

## 2012-11-18 ENCOUNTER — Telehealth: Payer: Self-pay | Admitting: Pulmonary Disease

## 2012-11-18 MED ORDER — GLIMEPIRIDE 4 MG PO TABS: 4.0000 mg | ORAL_TABLET | Freq: Every day | ORAL | Status: DC

## 2012-11-18 MED ORDER — HYDRALAZINE HCL 25 MG PO TABS: 25.0000 mg | ORAL_TABLET | Freq: Two times a day (BID) | ORAL | Status: DC

## 2012-11-18 NOTE — Telephone Encounter (Signed)
Spoke with the pt I advised hydralazine was sent to pharm  He states that the glimepiride was recently filled for the 1 mg tablets, but he has been taking 4 mg  I reviewed last ov and most recent labs and according to this, he should be taking the 4 mg I have corrected MAR and sent in the appropriate dose Nothing further needed per pt

## 2012-11-21 ENCOUNTER — Other Ambulatory Visit: Payer: Self-pay | Admitting: Pulmonary Disease

## 2012-11-23 ENCOUNTER — Other Ambulatory Visit: Payer: Self-pay | Admitting: Pulmonary Disease

## 2012-11-23 MED ORDER — METFORMIN HCL 500 MG PO TABS: 1000.0000 mg | ORAL_TABLET | Freq: Two times a day (BID) | ORAL | Status: DC

## 2012-12-21 ENCOUNTER — Other Ambulatory Visit: Payer: Self-pay | Admitting: *Deleted

## 2012-12-21 MED ORDER — GLUCOSE BLOOD VI STRP: ORAL_STRIP | Status: DC

## 2012-12-29 ENCOUNTER — Other Ambulatory Visit (INDEPENDENT_AMBULATORY_CARE_PROVIDER_SITE_OTHER): Payer: Medicare Other

## 2012-12-29 ENCOUNTER — Encounter: Payer: Self-pay | Admitting: Pulmonary Disease

## 2012-12-29 ENCOUNTER — Ambulatory Visit (INDEPENDENT_AMBULATORY_CARE_PROVIDER_SITE_OTHER): Payer: Medicare Other | Admitting: Pulmonary Disease

## 2012-12-29 VITALS — BP 124/70 | HR 64 | Temp 97.8°F | Ht 72.0 in | Wt 234.6 lb

## 2012-12-29 DIAGNOSIS — N401 Enlarged prostate with lower urinary tract symptoms: Secondary | ICD-10-CM

## 2012-12-29 DIAGNOSIS — E785 Hyperlipidemia, unspecified: Secondary | ICD-10-CM

## 2012-12-29 DIAGNOSIS — I10 Essential (primary) hypertension: Secondary | ICD-10-CM

## 2012-12-29 DIAGNOSIS — K5730 Diverticulosis of large intestine without perforation or abscess without bleeding: Secondary | ICD-10-CM

## 2012-12-29 DIAGNOSIS — F411 Generalized anxiety disorder: Secondary | ICD-10-CM

## 2012-12-29 DIAGNOSIS — M1990 Unspecified osteoarthritis, unspecified site: Secondary | ICD-10-CM

## 2012-12-29 DIAGNOSIS — E041 Nontoxic single thyroid nodule: Secondary | ICD-10-CM

## 2012-12-29 DIAGNOSIS — Z23 Encounter for immunization: Secondary | ICD-10-CM

## 2012-12-29 DIAGNOSIS — D126 Benign neoplasm of colon, unspecified: Secondary | ICD-10-CM

## 2012-12-29 DIAGNOSIS — I2510 Atherosclerotic heart disease of native coronary artery without angina pectoris: Secondary | ICD-10-CM

## 2012-12-29 LAB — BASIC METABOLIC PANEL
CO2: 29 mEq/L (ref 19–32)
Chloride: 101 mEq/L (ref 96–112)
Creatinine, Ser: 1.1 mg/dL (ref 0.4–1.5)
Potassium: 4.3 mEq/L (ref 3.5–5.1)

## 2012-12-29 LAB — HEMOGLOBIN A1C: Hgb A1c MFr Bld: 7 % — ABNORMAL HIGH (ref 4.6–6.5)

## 2012-12-29 MED ORDER — ONETOUCH ULTRA SYSTEM W/DEVICE KIT: 1.0000 | PACK | Freq: Once | Status: DC

## 2012-12-29 MED ORDER — GLUCOSE BLOOD VI STRP: ORAL_STRIP | Status: DC

## 2012-12-29 NOTE — Patient Instructions (Signed)
Today we updated your med list in our EPIC system...    Continue your current medications the same...  Today we did your folow up DM labs...    We will contact you w/ the results when available...   We will get another DM meter for you to try...  We gave you the 2014 Flu vaccine...  Call for any questions...  Let's plan a follow up visit in 43mo, sooner if needed for problems.Marland KitchenMarland Kitchen

## 2012-12-29 NOTE — Progress Notes (Signed)
Subjective:    Patient ID: [name], male    DOB: Jun 21, 1934, 78 y.o.   MRN: [id number]  HPI 78 y/o WM here for a follow up visit... he has multiple medical problems as noted below... Followed for general medical purposes w/ HBP, CAD, Hyperlipidemia, DM, DJD, etc... also sees DrWall for Cards, DrPerry for GI (Gastritis, Divertics, Polyps), & DrBorden for Urology (Hx Kidney stones & BPH)...   ~  September 17, 2011:  41mo ROV & [name] has had a time w/ kidney stones> went to ER 3/13 w/ left flank pain, found to have obstructive uropathy, seen by DrNesi/ Laverle Patter- cysto, stent placed, lithotripsy, then stent removed; stone analysis was calcium oxalate; voiding satis on Flomax & they discussed dietary adjustments; he has stable stone in right renal pelvis being followed...     BP controlled on Metop, Amlodip, Lisin/Hct; BP= 152/88 today & he denies CP, palpit, SOB, edema, etc...     Chol has been fair on diet alone & he has repeatedly refused med rx...    DM w/ fair regulation on Metform+Glimep but he c/o low sugars despite A1c in the mid 7's; see labs below...    He has DJD but managing satis on OTC analgesics... We reviewed prob list, meds, xrays and labs> see below for updates >> LABS 8/13:  Chems- ok x BS=163;  A1c=7.6  ~  March 22, 2012:  41mo ROV & [name] says he's feeling well- no new complaints or concerns; but he was in the ER 1/14 w/ CP & left arm pain> had Lexiscan Myoview which showed some apical thinning, no ischemia, +global LV dysfunction w/ EF=43% & global HK;  He had f/u [facility] 1/14 & his BP was 160/80 & they adjusted his meds- added Prav40 & increased LisinoprilHCT to 20-25    HBP> on Metop50Bid, Amlod10, LisinHCT 20-25, K20/d; BP= 180/90 & he denies further CP, palpit, dizzy, SOB, edema; we decided to ADD APRESOLINE 25mg Bid.    CAD> on ASA325; known non-obstructive CAD (cath 2004) followed by DrWall/Nishan w/ ER visit 1/14 & Myoview as above...    Hyperlipid> prev on diet alone  but Prav40 was added 1/14 by Cards; FLP shows TChol 173, TG 178, HDL 33, LDL 104; Rec- continue same, better diet...    DM> on Metform500Bid + Glimep1mg /d; labs showed BS= 224, A1c= 8.8; Rec- incr Metform-2Bid, Glimep4, + better diet & ROV 16mo...    GI- Gastritis, Divertics, Polyps> stable & doing well on Prilosec prn; Last colon 4/11 showed divertics, hems, ?evid of prior sigm resection? (he never had colon surg)...    GU- BPH, KidStones> on Flomax & doing well w/o signif urinary symptoms; last stone & lithotripsy was 3/13...    DJD> followed by V[facility]amo on Hydrocodone and he notes it is hard to exercise due to his knees... We reviewed prob list, meds, xrays and labs> see below for updates >> he had the Flu shot 10/13 & TDAP 8/13... EKG 1/14 showed NSR, rate61, inferior scar, NSSTTWA... LABS 2/14:  FLP- not at goals on Prav40, needs beeter diet, get wt down;  Chems- ok x BS=224 A1c=8.8;  CBC- wnl;  PSA=2.61...  ~  Jun 18, 2012:  16mo ROV and [name] is brought back to recheck his BP & DM labs after adjusting his meds in Feb>>    HBP> on Metop50Bid, Amlod10, LisinHCT 20-25, K20/d & Apresoline25Bid added; BP= 152/84 & he denies CP, palpit, dizzy, SOB, edema; he did not bring med bottles or a list &  we reviewed his & importance of taking them every day!    CAD> on ASA325; known non-obstructive CAD (cath 2004) followed by DrWall/Nishan w/ ER visit 1/14 & Myoview which showed some apical thinning, no ischemia, +global LV dysfunction w/ EF=43% & global HK...    Hyperlipid> prev on diet alone but Prav40 was added 1/14 by Cards; FLP 2/14 showed TChol 173, TG 178, HDL 33, LDL 104; Rec- continue same, better diet...    DM> on Metform500-2Bid + Glimep4mg /d; labs today show BS= 123, A1c= 7.2; Improved w/ these 2 meds maximized... We reviewed prob list, meds, xrays and labs> see below for updates >>  LABS 5/14:  Chems- ok x BS=123, A1c=7.2 (Improved)...  ~  December 29, 2012:  25mo ROV & [name] had another  kidney stone w/ lithotripsy 6/14;  Now improved & back to baseline, he reports a good interval otherwise; We reviewed the following medical problems during today's office visit >>     HBP> on Metop50Bid, Amlod10, LisinHCT 20-25, Apres25Bid, K20/d; BP= 124/70 & he denies CP, palpit, dizzy, SOB, edema...     CAD> on ASA325; known non-obstructive CAD (cath 2004) followed by DrWall/Nishan w/ ER visit 1/14 & Myoview showed some apical thinning, no ischemia +global LV dysfunction w/ EF=43% & global HK...    Hyperlipid> on Prav40; FLP 2/14 showed TChol 173, TG 178, HDL 33, LDL 104; Rec- continue same, better diet...    DM> on Metform1000Bid + Glimep4mg /d; Labs 11/14 showed BS= 158, A1c= 7.0; Rec- continue same + diet/ exercise...    GI- Gastritis, Divertics, Polyps> stable & doing well on Prilosec prn; Last colon 4/11 showed divertics, hems, ?evid of prior sigm resection? (he never had colon surg)...    GU- BPH, KidStones> on Flomax & doing well w/o signif urinary symptoms; prev stone & lithotripsy 3/13, he had another one 6/14 as noted...    DJD> followed by [facility] on Hydrocodone and he notes it is hard to exercise due to his knees... We reviewed prob list, meds, xrays and labs> see below for updates >> he needs a new DM meter & strips;  OK Flu shot today... LABS 11/14:  Chems- ok x BS=158, A1c=7.0.Marland KitchenMarland Kitchen           Problem List:  HEARING LOSS - he also sees DrShoemaker w/ hearing eval 7/09 & new hearing aides...  HYPERTENSION (ICD-401.9) - controlled on METOPROLOL 50mg Bid, AMOLDIPINE 10mg /d, LISINOPRIL/HCT 20-25, KCl 20/d & APRESOLINE 25mg Bid added 2/14...   ~  3/10:  Metoprolol 50mg  Bid added to his regimen. ~  2/12:  he was using an OLD MED LIST (which we discarded) & we reviewed his 4 med regimen & asked him to monitor BP at home. ~  2/13:  BP= 134/82> he knows to follow a low salt diet and needs to lose wt; he is asymtomatic- denies HA, fatigue, visual changes, CP, palipit, dizziness, syncope,  dyspnea, edema, etc... ~  CXR 3/13 showed cardiomeg, lower lung vols, clear/ NAD.Marland Kitchen. ~  8/13:  BP= 152/88 & needs to take meds regularly, get wt down... ~  1/14:  BP was 160/80 & Cards incr the LisinHCT to 20-25 daily... ~  2/14: on Metop50Bid, Amlod10, LisinHCT 20-25, K20/d; BP= 180/90 & he denies further CP, palpit, dizzy, SOB, edema; we decided to ADD APRESOLINE 25mg Bid. ~  5/14: on Metop50Bid, Amlod10, LisinHCT 20-25, K20/d & Apresoline25Bid added; BP= 152/84 & he denies CP, palpit, dizzy, SOB, edema; he did not bring med bottles or a list & we  reviewed his & importance of taking them every day! ~  11/14: on Metop50Bid, Amlod10, LisinHCT 20-25, Apres25Bid, K20/d; BP= 124/70 & he denies CP, palpit, dizzy, SOB, edema.  CAD (ICD-414.00) - Hx non-obstructive disease on cath 6/04 w/ 50%3rdDiag branch of LAD, 40%CIRC, 30%distalRCA... he takes ASA 81mg  daily and practicing secondary risk factor reduction strategy...  currently w/o CP, palpit, change in breathing, etc... last seen by DrWall 4/10> stable, no changes made. ~  3/10:  Myoview showed mild global HK, EF= 47%, apic thinning vs sm infarct & ?mild ischemia. ~  7/11:  no CP, palpit, SOB, etc... he is active- walking, yard, etc... ~  9/11:  adm by TH w/ atypCP, seen by Walker Kehr & no further testing felt necessary. ~  1/14: he went to ER 1/14 w/ CP & left arm pain> Lexiscan Myoview which showed some apical thinning, no ischemia, +global LV dysfunction w/ EF=43% & global HK.  HYPERLIPIDEMIA, BORDERLINE (ICD-272.4) - prev on diet alone, bur Cards 1/14 added PRAVASTATIN40...  ~  FLP 12/07 showing TChol 157, TG 137, HDL 35, LDL 95 ~  FLP 1/09 showed TChol 170, TG 144, HDL 32, LDL 109 ~  FLP 7/09 (wt= 233#) showed TChol 158, TG 162, HDL 31, LDL 95 ~  FLP in hosp 3/10 showed TChol 154, TG 177, HDL 25, LDL 94... he doesn't want Fibrate Rx. ~  FLP 7/10 showed TChol 160, TG 162, HDL 30, LDL 98... still refuses med Rx... ~  FLP 1/11 showed TChol 174,  TG 170, HDL 36, LDL 104 ~  FLP 7/11 showed TChol 161, TG 192, HDL 34, LDL 88 ~  FLP 2/12 showed TChol 166, TG 273, HDL 31, LDL 103... worse TG- may need Fibrate if wt not coming down! ~  FLP 2/13 on diet alone showed TChol 182, TG 178, HDL 34, LDL 112... He refuses med rx. ~  FLP 2/14 on Prav40 x36mo showed TChol 173, TG 178, HDL 33, LDL 104... Rec- continue Rx & diet.  AODM (ICD-250.00) - on METFORMIN 500mg Bid + GLIMEPIRIDE 1mg /d (added 8/12); discussed weight reduction strategies... ~  in 2008 BS=127, & HgA1c=6.5 on Metformin 500Bid... pt decr to 1/d on his own. ~  labs 1/09 showed BS= 143, HgA1c= 6.8.Marland Kitchen. ~  labs 7/09 (wt= 233#) showed BS= 136, HgA1c= 6.9.Marland KitchenMarland Kitchen rec- OK on Metform500/d, needs better diet. ~  labs 2/10 (wt= 242#) showed BS= 171, HgA1c= 6.9.Marland Kitchen. rec> get wt down. ~  labs 7/10 (wt= 240#) showed BS= 135, A1c= 7.0.Marland KitchenMarland Kitchen may need additional med. ~  labs 1/11 (wt=245#) showed BS= 149, A1c= 7.1 ~  labs 7/11 (wt= 226#) showed BS= 146, A1c= 7.3 ~  labs 2/12 (wt=236#) showed BS= 151, A1c= 7.7.Marland Kitchen. rec> incr METFORM500Bid & get wt down. ~  Labs 8/12 (wt=232#) showed BS= 142, A1c= 8.4... Rec> add GLIMEPIRIDE 1mg /d. ~  Labs 2/13 (wt=241#) showed BS=160, A1c= 7.8.Marland KitchenMarland Kitchen Must get wt down or incr meds... ~  Labs 8/13 (wt=232#) showed BS=163, A1c= 7.6.Marland KitchenMarland Kitchen Continue same. ~  Labs 2/14 (wt=241#) showed BS= 224, A1c= 8.8.Marland KitchenMarland Kitchen Rec- incr Metform500-2Bid & Glimep4/d... ~  5/14: on Metform500-2Bid + Glimep4mg /d; labs today show BS= 123, A1c= 7.2; Improved w/ these 2 meds maximized.  ~  11/14: on Metform1000Bid + Glimep4mg /d; Labs 11/14 showed BS= 158, A1c= 7.0; Rec- continue same + diet/ exercise  Hx of GASTRITIS (ICD-535.50) - on PRILOSEC 20mg  OTC Prn... ~  EGD 2/09 by DrDBrodie showed 2cmHH, mod esophagitis, duodenitis... Rx'd ProtonixBid.  DIVERTICULOSIS OF COLON (ICD-562.10) >> COLONIC  POLYPS (ICD-211.3) >> ~  Colonoscopy 9/05 by DrPerry showed divertics & diminutive polyp... f/u planned 5 years. ~  Colonoscopy  4/11 showed mod divertics, evid of prior sigmoid resection (?- we don't have hx of this), int hems... ~  CT Abd 3/13> acute obstructive uropathy w/ left ureteral stone; another stone in right renal pelvis, 22mm hyperdense left upper pole renal lesion ?hemorrhagic cyst, enlarged prostate, hepatic steatosis, diverticulosis...  HYPERTROPHY PROSTATE W/UR OBST & OTH LUTS (ICD-600.01) RENAL CALCULUS (ICD-592.0) - on FLOMAX 0.4mg /d per Urology & followed for BPH, Urolithiasis, ED & PSA screening purposes. ~  he has prev required cystoscopy w/ stents, & prev had lithotripsy as well...  ~  labs 7/10 showed PSA= 2.09, & subseq labs by DrRDavis (we don't have his notes). ~  11/11:  seen by DrBorden & passed an 8mm right ureteral stone, XRays show bilat nonobstructing stones in kidneys. ~  3/12:  Continued f/u w/ DrBorden & PSA= 2.0 ~  2/13:  PSA= 2.57 & he is voiding satis ~  3/13:  ER 3/13 w/ left flank pain, found to have obstructive uropathy, seen by DrNesi/ Laverle Patter- cysto, stent placed, lithotripsy, then stent removed; stone analysis was calcium oxalate; voiding satis on Flomax & they discussed dietary adjustments; he has stable stone in right renal pelvis being followed...  ~  6/14: [name] had another kidney stone w/ lithotripsy 6/14; now improved & back to baseline...  Hx of PYELONEPHRITIS (ICD-590.80) - Adm 4/08 w/ pyelo from obstructing R kidney stone requiring cysto, stent, laser fragmentation...  DEGENERATIVE JOINT DISEASE (ICD-715.90) - he uses OTC meds w/ relief... followed by Sherin Quarry w/ "endstage" DJD in knees (left worse than right) & told he needs TKRs- he is determined to "keep them a little bit longer"...  ANXIETY (ICD-300.00) - his wife passed away 2005/07/16 & he has been enjoying fishing, hunting, and taking care of his 13 y/o grandson.   Past Surgical History  Procedure Laterality Date  . Appendectomy    . Cataract extraction    . Knee surgery      bilateral  . Shoulder surgery  04/2005     left by Dr. Luiz Blare  . S/p elap 1984-07-16 w/excision of leiomyoma @ ge junction, meckle's divertic resected, & cholecystectomy    . S/p cysto & stents for kidnedy stone 11/08 by dr. Wanda Plump    . Cystoscopy  04/17/11    stent placed  . Cystoscopy w/ ureteral stent placement  04/17/2011    Procedure: CYSTOSCOPY WITH RETROGRADE PYELOGRAM/URETERAL STENT PLACEMENT;  Surgeon: Lindaann Slough, MD;  Location: [facility];  Service: Urology;  Laterality: Left;    Outpatient Encounter Prescriptions as of 12/29/2012  Medication Sig  . amLODipine (NORVASC) 10 MG tablet TAKE ONE TABLET BY MOUTH EVERY DAY BEFORE BREAKFAST  . aspirin EC 325 MG tablet Take 325 mg by mouth at bedtime.   Marland Kitchen glimepiride (AMARYL) 4 MG tablet Take 1 tablet (4 mg total) by mouth daily before breakfast.  . glucose blood (ONE TOUCH ULTRA TEST) test strip Use to check blood sugar daily or as directed  . hydrALAZINE (APRESOLINE) 25 MG tablet Take 1 tablet (25 mg total) by mouth 2 (two) times daily.  Marland Kitchen HYDROcodone-acetaminophen (NORCO/VICODIN) 5-325 MG per tablet Take 2 tablets by mouth every 6 (six) hours as needed for pain.  Marland Kitchen lisinopril-hydrochlorothiazide (PRINZIDE,ZESTORETIC) 20-25 MG per tablet Take 1 tablet by mouth every morning.  . metFORMIN (GLUCOPHAGE) 1000 MG tablet Take 1,000 mg by mouth 2 (two) times daily with  a meal.  . metoprolol (LOPRESSOR) 50 MG tablet TAKE ONE TABLET BY MOUTH TWICE DAILY.  Marland Kitchen potassium chloride SA (K-DUR,KLOR-CON) 20 MEQ tablet Take 20 mEq by mouth daily.  . pravastatin (PRAVACHOL) 40 MG tablet Take 40 mg by mouth at bedtime.  . tamsulosin (FLOMAX) 0.4 MG CAPS Take 0.4 mg by mouth daily.  . [DISCONTINUED] amLODipine (NORVASC) 10 MG tablet Take 1 tablet (10 mg total) by mouth daily before breakfast.  . [DISCONTINUED] hydrALAZINE (APRESOLINE) 25 MG tablet TAKE ONE TABLET BY MOUTH TWICE DAILY  . [DISCONTINUED] metFORMIN (GLUCOPHAGE) 500 MG tablet Take 2 tablets (1,000 mg total) by mouth 2 (two) times daily with  a meal.  . [DISCONTINUED] metoprolol (LOPRESSOR) 50 MG tablet Take 1 tablet (50 mg total) by mouth 2 (two) times daily.    Allergies  Allergen Reactions  . Codeine Phosphate     REACTION: nausea  . Morphine     REACTION: nausea    Current Medications, Allergies, Past Medical History, Past Surgical History, Family History, and Social History were reviewed in [employer] record.    Review of Systems    See HPI - all other systems neg except as noted...  The patient complains of decreased hearing, dyspnea on exertion, and difficulty walking.  The patient denies anorexia, fever, weight loss, weight gain, vision loss, hoarseness, chest pain, syncope, peripheral edema, prolonged cough, headaches, hemoptysis, abdominal pain, melena, hematochezia, severe indigestion/heartburn, hematuria, incontinence, muscle weakness, suspicious skin lesions, transient blindness, depression, unusual weight change, abnormal bleeding, enlarged lymph nodes, and angioedema.     Objective:   Physical Exam    WD, Overweight, 78 y/o WM in NAD... GENERAL:  Alert & oriented; pleasant & cooperative... HEENT:  [place]/AT, EOM-wnl, PERRLA, EACs-clear, TMs-wnl, NOSE-clear, THROAT-clear & wnl. NECK:  Supple w/ fairROM; no JVD; normal carotid impulses w/o bruits; no thyromegaly or nodules palpated; no lymphadenopathy. CHEST:  Clear to P & A; without wheezes/ rales/ or rhonchi. HEART:  Regular Rhythm; without murmurs/ rubs/ or gallops. ABDOMEN:  Soft & nontender; normal bowel sounds; no organomegaly or masses detected. EXT:  mod arthritic changes; no varicose veins/ +venous insuffic/ or edema. NEURO:  CN's intact;  no focal neuro deficits... DERM:  No lesions noted; no rash etc...  RADIOLOGY DATA:  Reviewed in the EPIC EMR & discussed w/ the patient...  LABORATORY DATA:  Reviewed in the EPIC EMR & discussed w/ the patient...   Assessment & Plan:    HBP>  BP better controlled on current meds as  listed, pt cautioned about compliance- bring med bottles to each office f/u visit...  CAD>  Continue ASA & secondary risk factor reduction strategy; he has seen DrNishan & DrWall in the past...  HYPERLIPIDEMIA>  On Prav40 now & FLP improved; needs better diet & wt reduction...  DM>  BS & A1c are much improved w/ the maximized Metformin & Glimepiride, continue same...  GI> Gastritis, Divertics, Polyps>  He uses Prilosec prn; had screening colon f/u 4/11 & neg w/o polyps etc...  GU> Kidney Stones, BPH w/ LTOS, ED>  Followed by DrBorden & his note reviewed; on Flomax, uses prostaglandin shots for ED prn; PSA=2.61 (2/14)...  DJD>  He had left knee shot from DrRowan recently; uses Aleve prn...   Patient's Medications  New Prescriptions   BLOOD GLUCOSE MONITORING SUPPL (ONE TOUCH ULTRA SYSTEM KIT) W/DEVICE KIT    1 kit by Does not apply route once.  Previous Medications   AMLODIPINE (NORVASC) 10 MG TABLET  TAKE ONE TABLET BY MOUTH EVERY DAY BEFORE BREAKFAST   ASPIRIN EC 325 MG TABLET    Take 325 mg by mouth at bedtime.    GLIMEPIRIDE (AMARYL) 4 MG TABLET    Take 1 tablet (4 mg total) by mouth daily before breakfast.   HYDRALAZINE (APRESOLINE) 25 MG TABLET    Take 1 tablet (25 mg total) by mouth 2 (two) times daily.   HYDROCODONE-ACETAMINOPHEN (NORCO/VICODIN) 5-325 MG PER TABLET    Take 2 tablets by mouth every 6 (six) hours as needed for pain.   LISINOPRIL-HYDROCHLOROTHIAZIDE (PRINZIDE,ZESTORETIC) 20-25 MG PER TABLET    Take 1 tablet by mouth every morning.   METFORMIN (GLUCOPHAGE) 1000 MG TABLET    Take 1,000 mg by mouth 2 (two) times daily with a meal.   METOPROLOL (LOPRESSOR) 50 MG TABLET    TAKE ONE TABLET BY MOUTH TWICE DAILY.   POTASSIUM CHLORIDE SA (K-DUR,KLOR-CON) 20 MEQ TABLET    Take 20 mEq by mouth daily.   PRAVASTATIN (PRAVACHOL) 40 MG TABLET    Take 40 mg by mouth at bedtime.   TAMSULOSIN (FLOMAX) 0.4 MG CAPS    Take 0.4 mg by mouth daily.  Modified Medications   Modified  Medication Previous Medication   GLUCOSE BLOOD (ONE TOUCH ULTRA TEST) TEST STRIP glucose blood (ONE TOUCH ULTRA TEST) test strip      Use to check blood sugar daily or as directed    Use to check blood sugar daily or as directed  Discontinued Medications   AMLODIPINE (NORVASC) 10 MG TABLET    Take 1 tablet (10 mg total) by mouth daily before breakfast.   HYDRALAZINE (APRESOLINE) 25 MG TABLET    TAKE ONE TABLET BY MOUTH TWICE DAILY   METFORMIN (GLUCOPHAGE) 500 MG TABLET    Take 2 tablets (1,000 mg total) by mouth 2 (two) times daily with a meal.   METOPROLOL (LOPRESSOR) 50 MG TABLET    Take 1 tablet (50 mg total) by mouth 2 (two) times daily.

## 2013-01-04 ENCOUNTER — Other Ambulatory Visit: Payer: Self-pay | Admitting: Pulmonary Disease

## 2013-03-04 ENCOUNTER — Other Ambulatory Visit: Payer: Self-pay | Admitting: Cardiology

## 2013-03-31 ENCOUNTER — Other Ambulatory Visit: Payer: Self-pay | Admitting: Cardiology

## 2013-04-08 ENCOUNTER — Emergency Department (HOSPITAL_COMMUNITY): Payer: Medicare Other

## 2013-04-08 ENCOUNTER — Encounter (HOSPITAL_COMMUNITY): Payer: Self-pay | Admitting: Emergency Medicine

## 2013-04-08 ENCOUNTER — Emergency Department (HOSPITAL_COMMUNITY)
Admission: EM | Admit: 2013-04-08 | Discharge: 2013-04-09 | Disposition: A | Discharge status: Home / Self Care | Payer: Medicare Other | Attending: Emergency Medicine | Admitting: Emergency Medicine

## 2013-04-08 DIAGNOSIS — Z9089 Acquired absence of other organs: Secondary | ICD-10-CM | POA: Insufficient documentation

## 2013-04-08 DIAGNOSIS — R112 Nausea with vomiting, unspecified: Secondary | ICD-10-CM

## 2013-04-08 DIAGNOSIS — Z79899 Other long term (current) drug therapy: Secondary | ICD-10-CM | POA: Insufficient documentation

## 2013-04-08 DIAGNOSIS — M546 Pain in thoracic spine: Secondary | ICD-10-CM | POA: Insufficient documentation

## 2013-04-08 DIAGNOSIS — I10 Essential (primary) hypertension: Secondary | ICD-10-CM | POA: Insufficient documentation

## 2013-04-08 DIAGNOSIS — Z87442 Personal history of urinary calculi: Secondary | ICD-10-CM | POA: Insufficient documentation

## 2013-04-08 DIAGNOSIS — R61 Generalized hyperhidrosis: Secondary | ICD-10-CM | POA: Insufficient documentation

## 2013-04-08 DIAGNOSIS — I2510 Atherosclerotic heart disease of native coronary artery without angina pectoris: Secondary | ICD-10-CM | POA: Insufficient documentation

## 2013-04-08 DIAGNOSIS — Z8659 Personal history of other mental and behavioral disorders: Secondary | ICD-10-CM | POA: Insufficient documentation

## 2013-04-08 DIAGNOSIS — R197 Diarrhea, unspecified: Secondary | ICD-10-CM | POA: Insufficient documentation

## 2013-04-08 DIAGNOSIS — M25519 Pain in unspecified shoulder: Secondary | ICD-10-CM

## 2013-04-08 DIAGNOSIS — E785 Hyperlipidemia, unspecified: Secondary | ICD-10-CM | POA: Insufficient documentation

## 2013-04-08 DIAGNOSIS — R5383 Other fatigue: Secondary | ICD-10-CM

## 2013-04-08 DIAGNOSIS — Z860100 Personal history of colon polyps, unspecified: Secondary | ICD-10-CM | POA: Insufficient documentation

## 2013-04-08 DIAGNOSIS — Z7982 Long term (current) use of aspirin: Secondary | ICD-10-CM | POA: Insufficient documentation

## 2013-04-08 DIAGNOSIS — N201 Calculus of ureter: Secondary | ICD-10-CM

## 2013-04-08 DIAGNOSIS — R1013 Epigastric pain: Secondary | ICD-10-CM | POA: Insufficient documentation

## 2013-04-08 DIAGNOSIS — R079 Chest pain, unspecified: Secondary | ICD-10-CM | POA: Insufficient documentation

## 2013-04-08 DIAGNOSIS — R5381 Other malaise: Secondary | ICD-10-CM | POA: Insufficient documentation

## 2013-04-08 DIAGNOSIS — E119 Type 2 diabetes mellitus without complications: Secondary | ICD-10-CM | POA: Insufficient documentation

## 2013-04-08 DIAGNOSIS — Z8719 Personal history of other diseases of the digestive system: Secondary | ICD-10-CM | POA: Insufficient documentation

## 2013-04-08 LAB — CBC WITH DIFFERENTIAL/PLATELET
Basophils Absolute: 0 10*3/uL (ref 0.0–0.1)
Basophils Relative: 0 % (ref 0–1)
Eosinophils Absolute: 0.1 10*3/uL (ref 0.0–0.7)
Eosinophils Relative: 1 % (ref 0–5)
HCT: 46.6 % (ref 39.0–52.0)
Hemoglobin: 16.7 g/dL (ref 13.0–17.0)
Lymphocytes Relative: 10 % — ABNORMAL LOW (ref 12–46)
Lymphs Abs: 0.8 10*3/uL (ref 0.7–4.0)
MCH: 33.4 pg (ref 26.0–34.0)
MCHC: 35.8 g/dL (ref 30.0–36.0)
MCV: 93.2 fL (ref 78.0–100.0)
Monocytes Absolute: 0.5 K/uL (ref 0.1–1.0)
Monocytes Relative: 7 % (ref 3–12)
Neutro Abs: 6.3 K/uL (ref 1.7–7.7)
Neutrophils Relative %: 82 % — ABNORMAL HIGH (ref 43–77)
Platelets: 272 10*3/uL (ref 150–400)
RBC: 5 MIL/uL (ref 4.22–5.81)
RDW: 13.4 % (ref 11.5–15.5)
WBC: 7.7 10*3/uL (ref 4.0–10.5)

## 2013-04-08 LAB — URINALYSIS, ROUTINE W REFLEX MICROSCOPIC
Bilirubin Urine: NEGATIVE
Glucose, UA: NEGATIVE mg/dL
Hgb urine dipstick: NEGATIVE
Ketones, ur: 15 mg/dL — AB
Leukocytes, UA: NEGATIVE
Nitrite: NEGATIVE
Protein, ur: 30 mg/dL — AB
Specific Gravity, Urine: 1.025 (ref 1.005–1.030)
Urobilinogen, UA: 0.2 mg/dL (ref 0.0–1.0)
pH: 5.5 (ref 5.0–8.0)

## 2013-04-08 LAB — COMPREHENSIVE METABOLIC PANEL
ALT: 40 U/L (ref 0–53)
AST: 21 U/L (ref 0–37)
Albumin: 3.7 g/dL (ref 3.5–5.2)
Alkaline Phosphatase: 55 U/L (ref 39–117)
GFR calc Af Amer: 72 mL/min — ABNORMAL LOW (ref >90)
Glucose, Bld: 159 mg/dL — ABNORMAL HIGH (ref 70–99)
Potassium: 3.8 mEq/L (ref 3.7–5.3)
Sodium: 141 mEq/L (ref 137–147)
Total Protein: 6.8 g/dL (ref 6.0–8.3)

## 2013-04-08 LAB — COMPREHENSIVE METABOLIC PANEL WITH GFR
BUN: 24 mg/dL — ABNORMAL HIGH (ref 6–23)
CO2: 24 meq/L (ref 19–32)
Calcium: 9.2 mg/dL (ref 8.4–10.5)
Chloride: 101 meq/L (ref 96–112)
Creatinine, Ser: 1.12 mg/dL (ref 0.50–1.35)
GFR calc non Af Amer: 62 mL/min — ABNORMAL LOW (ref >90)
Total Bilirubin: 0.4 mg/dL (ref 0.3–1.2)

## 2013-04-08 LAB — I-STAT TROPONIN, ED
Troponin i, poc: 0.01 ng/mL (ref 0.00–0.08)
Troponin i, poc: 0.01 ng/mL (ref 0.00–0.08)

## 2013-04-08 LAB — LIPASE, BLOOD: Lipase: 45 U/L (ref 11–59)

## 2013-04-08 LAB — URINE MICROSCOPIC-ADD ON

## 2013-04-08 LAB — CBG MONITORING, ED: Glucose-Capillary: 135 mg/dL — ABNORMAL HIGH (ref 70–99)

## 2013-04-08 MED ORDER — FENTANYL CITRATE 0.05 MG/ML IJ SOLN
50.0000 ug | Freq: Once | INTRAMUSCULAR | Status: AC
Start: 2013-04-08 — End: 2013-04-08
  Administered 2013-04-08: 50 ug via INTRAVENOUS
  Filled 2013-04-08: qty 2

## 2013-04-08 MED ORDER — IOHEXOL 300 MG/ML  SOLN
100.0000 mL | Freq: Once | INTRAMUSCULAR | Status: AC | PRN
  Administered 2013-04-08: 100 mL via INTRAVENOUS

## 2013-04-08 MED ORDER — SODIUM CHLORIDE 0.9 % IV SOLN
Freq: Once | INTRAVENOUS | Status: AC
  Administered 2013-04-08: 22:00:00 via INTRAVENOUS

## 2013-04-08 MED ORDER — FENTANYL CITRATE 0.05 MG/ML IJ SOLN
50.0000 ug | Freq: Once | INTRAMUSCULAR | Status: AC
  Administered 2013-04-08: 50 ug via INTRAVENOUS
  Filled 2013-04-08: qty 2

## 2013-04-08 MED ORDER — ONDANSETRON HCL 4 MG/2ML IJ SOLN
4.0000 mg | Freq: Once | INTRAMUSCULAR | Status: AC
  Administered 2013-04-08: 4 mg via INTRAVENOUS
  Filled 2013-04-08: qty 2

## 2013-04-08 MED ORDER — SODIUM CHLORIDE 0.9 % IV BOLUS (SEPSIS)
1000.0000 mL | Freq: Once | INTRAVENOUS | Status: AC
  Administered 2013-04-08: 1000 mL via INTRAVENOUS

## 2013-04-08 MED ORDER — IOHEXOL 300 MG/ML  SOLN
25.0000 mL | Freq: Once | INTRAMUSCULAR | Status: AC | PRN
  Administered 2013-04-08: 25 mL via ORAL

## 2013-04-08 MED ORDER — SODIUM CHLORIDE 0.9 % IV BOLUS (SEPSIS): 500.0000 mL | Freq: Once | INTRAVENOUS | Status: DC

## 2013-04-08 NOTE — ED Notes (Signed)
Patient transported to CT 

## 2013-04-08 NOTE — ED Notes (Signed)
Pt informed of need for urine sample. Pt states he does not have to go right now but will try shortly.

## 2013-04-08 NOTE — ED Notes (Signed)
CBG CHECKED 135

## 2013-04-08 NOTE — ED Provider Notes (Addendum)
CSN: [id number]     Arrival date & time 04/08/13  1909 History   First MD Initiated Contact with Patient 04/08/13 1938     Chief Complaint  Patient presents with  . Emesis     (Consider location/radiation/quality/duration/timing/severity/associated sxs/prior Treatment) HPI Comments: Pt reports didn't feel well in general yesterday, just fatigue, no fevers, chills, this AM woke up with nausea and had pain across shoulders and upper back.  He later developed pain in upper epigastrium and lower chest, no radiation to back associated with numerous episodes of non bloody emesis and watery diarrhea.  Pt has a h/o diverticulitis, kidney stone, cholecystectomy in the past, this feels new and different.  Pt had a 40-60% CAD blockage diagnosed by cath about 10-15 years ago with Dr. Verl Blalock treated medically.    Patient is a 79 y.o. male presenting with vomiting. The history is provided by the patient, a relative and medical records.  Emesis Associated symptoms: abdominal pain, arthralgias and diarrhea   Associated symptoms: no chills and no headaches     Past Medical History  Diagnosis Date  . CAD (coronary artery disease)   . History of syncope   . HTN (hypertension)   . Borderline hyperlipidemia   . History of gastritis   . Diverticulosis of colon   . Colon polyps   . Hypertrophy of prostate with urinary obstruction and other lower urinary tract symptoms (LUTS)   . Renal calculus   . History of pyelonephritis   . DJD (degenerative joint disease)   . Anxiety   . DM (diabetes mellitus)     Adult onset   Past Surgical History  Procedure Laterality Date  . Appendectomy    . Cataract extraction    . Knee surgery      bilateral  . Shoulder surgery  04/2005    left by Dr. Berenice Primas  . 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 S/p cysto & stents for kidnedy stone 11/08 by dr. Reece Agar    . Cystoscopy  04/17/11    stent placed  .  Cystoscopy w/ ureteral stent placement  04/17/2011    Procedure: CYSTOSCOPY WITH RETROGRADE PYELOGRAM/URETERAL STENT PLACEMENT;  Surgeon: Hanley Ben, MD;  Location: [facility];  Service: Urology;  Laterality: Left;   Family History  Problem Relation Age of Onset  . Heart attack Mother   . Heart attack Mother    History  Substance Use Topics  . Smoking status: Never Smoker   . Smokeless tobacco: Not on file  . Alcohol Use: No    Review of Systems  Constitutional: Positive for appetite change and fatigue. Negative for fever and chills.  Respiratory: Negative for cough and shortness of breath.   Cardiovascular: Positive for chest pain.  Gastrointestinal: Positive for nausea, vomiting, abdominal pain and diarrhea. Negative for blood in stool.  Genitourinary: Negative for dysuria and flank pain.  Musculoskeletal: Positive for arthralgias and back pain.  Neurological: Negative for syncope, light-headedness and headaches.  All other systems reviewed and are negative.      Allergies  Codeine phosphate and Morphine  Home Medications   Current Outpatient Rx  Name  Route  Sig  Dispense  Refill  . amLODipine (NORVASC) 10 MG tablet   Oral   Take 10 mg by mouth daily.         Marland Kitchen aspirin EC 325 MG tablet   Oral   Take 325 mg by mouth at  bedtime.          Marland Kitchen glimepiride (AMARYL) 4 MG tablet   Oral   Take 1 tablet (4 mg total) by mouth daily before breakfast.   30 tablet   5   . hydrALAZINE (APRESOLINE) 25 MG tablet   Oral   Take 1 tablet (25 mg total) by mouth 2 (two) times daily.   60 tablet   5   . lisinopril-hydrochlorothiazide (PRINZIDE,ZESTORETIC) 20-25 MG per tablet   Oral   Take 1 tablet by mouth daily.         . metFORMIN (GLUCOPHAGE) 500 MG tablet   Oral   Take 500 mg by mouth 2 (two) times daily with a meal.         . metoprolol (LOPRESSOR) 50 MG tablet   Oral   Take 50 mg by mouth 2 (two) times daily.         . naproxen sodium (ANAPROX) 220 MG  tablet   Oral   Take 220 mg by mouth daily as needed (for pain).         . potassium chloride SA (K-DUR,KLOR-CON) 20 MEQ tablet   Oral   Take 20 mEq by mouth daily.         . pravastatin (PRAVACHOL) 40 MG tablet   Oral   Take 40 mg by mouth at bedtime.         . tamsulosin (FLOMAX) 0.4 MG CAPS   Oral   Take 0.4 mg by mouth daily.         Marland Kitchen aspirin 81 MG chewable tablet   Oral   Chew 324 mg by mouth once.         . ondansetron (ZOFRAN-ODT) 8 MG disintegrating tablet   Oral   Take 1 tablet (8 mg total) by mouth every 12 (twelve) hours as needed for nausea.   20 tablet   0   . oxyCODONE-acetaminophen (PERCOCET/ROXICET) 5-325 MG per tablet      1-2 tablets po q 6 hours prn moderate to severe pain   20 tablet   0    BP 169/77  Pulse 82  Temp(Src) 98.9 F (37.2 C) (Oral)  Resp 23  Ht 6' (1.829 m)  Wt 223 lb (101.152 kg)  BMI 30.24 kg/m2  SpO2 93% Physical Exam  Nursing note and vitals reviewed. Constitutional: He appears well-developed and well-nourished.  HENT:  Head: Normocephalic and atraumatic.  Eyes: Conjunctivae and EOM are normal. No scleral icterus.  Neck: Normal range of motion. Neck supple.  Cardiovascular: Normal rate, regular rhythm and intact distal pulses.   No murmur heard. Pulmonary/Chest: Effort normal. No respiratory distress. He has no wheezes. He has no rales.  Abdominal: Soft. He exhibits no distension. There is no tenderness. There is no rebound and no guarding.  Skin: Skin is warm. No rash noted. He is diaphoretic.    ED Course  Procedures (including critical care time) Labs Review Labs Reviewed  CBC WITH DIFFERENTIAL - Abnormal; Notable for the following:    Neutrophils Relative % 82 (*)    Lymphocytes Relative 10 (*)    All other components within normal limits  COMPREHENSIVE METABOLIC PANEL - Abnormal; Notable for the following:    Glucose, Bld 159 (*)    BUN 24 (*)    GFR calc non Af Amer 62 (*)    GFR calc Af Amer 72  (*)    All other components within normal limits  URINALYSIS, ROUTINE W REFLEX MICROSCOPIC -  Abnormal; Notable for the following:    Ketones, ur 15 (*)    Protein, ur 30 (*)    All other components within normal limits  CBG MONITORING, ED - Abnormal; Notable for the following:    Glucose-Capillary 135 (*)    All other components within normal limits  LIPASE, BLOOD  URINE MICROSCOPIC-ADD ON  I-STAT TROPOININ, ED  I-STAT TROPOININ, ED   Imaging Review Ct Abdomen Pelvis W Contrast  04/09/2013   CLINICAL DATA:  Epigastric pain, nausea/vomiting/diarrhea  EXAM: CT ABDOMEN AND PELVIS WITH CONTRAST  TECHNIQUE: Multidetector CT imaging of the abdomen and pelvis was performed using the standard protocol following bolus administration of intravenous contrast.  CONTRAST:  139mL OMNIPAQUE IOHEXOL 300 MG/ML  SOLN  COMPARISON:  04/17/2011  FINDINGS: Lung bases are essentially clear.  Coronary atherosclerosis.  Hepatic steatosis.  Spleen, pancreas, and adrenal glands are within normal limits.  Status post cholecystectomy. No intrahepatic or extrahepatic ductal dilatation.  Bilateral renal atrophy. Small bilateral renal cysts measuring up to 2.4 cm in the anterior left upper pole (series 2/ image 35) and 1.5 cm in the posterior interpolar right kidney (series 2/ image 44). Multiple punctate nonobstructing renal calculi bilaterally. No hydronephrosis.  No evidence of bowel obstruction.  Prior appendectomy.  Atherosclerotic calcifications of the abdominal aorta and branch vessels.  No abdominopelvic ascites.  No suspicious abdominopelvic lymphadenopathy.  Prostatomegaly with enlargement of the central gland which indents the base of the bladder.  6 mm distal left ureteral calculus (series 2/image 39). Bladder is within normal limits.  Small fat containing bilateral inguinal hernias.  Degenerative changes of the visualized thoracolumbar spine.  IMPRESSION: 6 mm distal left ureteral calculus.  No hydronephrosis.   Additional punctate nonobstructing renal calculi bilaterally.   Electronically Signed   By: Julian Hy M.D.   On: 04/09/2013 00:21   Dg Chest Port 1 View  04/08/2013   CLINICAL DATA:  Chest pain  EXAM: PORTABLE CHEST - 1 VIEW  COMPARISON:  02/18/2012  FINDINGS: Low lung volumes. Left retrocardiac atelectasis or early interstitial infiltrate. Right lung clear. Heart size upper limits normal. . No effusion. Visualized skeletal structures are unremarkable.  IMPRESSION: 1. Low lung volumes with new subsegmental atelectasis versus early infiltrate, left lung base.   Electronically Signed   By: Arne Cleveland M.D.   On: 04/08/2013 20:22     EKG Interpretation   Date/Time:  Friday April 08 2013 19:18:10 EST Ventricular Rate:  87 PR Interval:  172 QRS Duration: 106 QT Interval:  381 QTC Calculation: 458 R Axis:   39 Text Interpretation:  Sinus rhythm Possible Inferior infarct, old similar  in appearance to ECG from February 18, 2012. Nonspecific ST and T wave  abnormality Abnormal ekg Confirmed by [facility]  MD, MICHEAL ([id number]) on  04/08/2013 8:24:31 PM     RA sat is 94% and I interpret to be adequate  10:01 PM Troponin is negative.  Pt's nausea is somewhat improved, still having pain across shoulders and upper epigastrium.  Given prior h/o several abd surgeries will get CT of abd/pelvis and continue to monitor cardiac and get a second troponin as well  11:48 PM 2 cardiac troponins are neg.  Other labs including electrolytes, WBC are not worrisome.  CT is pending.    12:35 AM Pt's symptoms are improved.  Pt has ureteral stone in distal left ureter.  Pt's family reports pt had a similar presentation with a kidney stone and had symptoms of a heart attack at  that time as well.  Didn't have diarrhea was the only difference.  Pt would like to be discharged.  Rx for pain, nausea given., needs to follow up with PCP and also [facility] referral given as well.    MDM   Final diagnoses:   Ureteral stone  Shoulder pain  Nausea vomiting and diarrhea   Pt with N/V/D associated with some upper epigastric pain, pain in upper back and shoulders with mild CAD diagnosed many years ago.  I suspect more of a GI and abdominal process over cardiac given the volume of vomiting and diarrhea pt has had today.  Will hydrate as he seems dehydrated, will treat symptoms, obtain troponins and monitor for symptom improvement.  Pt has no guard or rebound on abd exam currently.  Has had colectomy and cholecystectomy in the past.     Saddie Benders. Dorna Mai, MD 04/09/13 [place] Dorna Mai, MD 04/09/13 [id number]

## 2013-04-08 NOTE — ED Notes (Signed)
Per EMS, pt. C/o N/V/D and epigastric pain starting this AM with radiation to bilateral shoulders and arms. 324 ASA given and 4mg  zofran. 170/90 BP.

## 2013-04-09 MED ORDER — ONDANSETRON 8 MG PO TBDP: 8.0000 mg | ORAL_TABLET | Freq: Two times a day (BID) | ORAL | Status: DC | PRN

## 2013-04-09 MED ORDER — OXYCODONE-ACETAMINOPHEN 5-325 MG PO TABS: ORAL_TABLET | ORAL | Status: DC

## 2013-04-09 NOTE — Discharge Instructions (Signed)
Kidney Stones Kidney stones (urolithiasis) are deposits that form inside your kidneys. The intense pain is caused by the stone moving through the urinary tract. When the stone moves, the ureter goes into spasm around the stone. The stone is usually passed in the urine.  CAUSES   A disorder that makes certain neck glands produce too much parathyroid hormone (primary hyperparathyroidism).  A buildup of uric acid crystals, similar to gout in your joints.  Narrowing (stricture) of the ureter.  A kidney obstruction present at birth (congenital obstruction).  Previous surgery on the kidney or ureters.  Numerous kidney infections. SYMPTOMS   Feeling sick to your stomach (nauseous).  Throwing up (vomiting).  Blood in the urine (hematuria).  Pain that usually spreads (radiates) to the groin.  Frequency or urgency of urination. DIAGNOSIS   Taking a history and physical exam.  Blood or urine tests.  CT scan.  Occasionally, an examination of the inside of the urinary bladder (cystoscopy) is performed. TREATMENT   Observation.  Increasing your fluid intake.  Extracorporeal shock wave lithotripsy This is a noninvasive procedure that uses shock waves to break up kidney stones.  Surgery may be needed if you have severe pain or persistent obstruction. There are various surgical procedures. Most of the procedures are performed with the use of small instruments. Only small incisions are needed to accommodate these instruments, so recovery time is minimized. The size, location, and chemical composition are all important variables that will determine the proper choice of action for you. Talk to your health care provider to better understand your situation so that you will minimize the risk of injury to yourself and your kidney.  HOME CARE INSTRUCTIONS   Drink enough water and fluids to keep your urine clear or pale yellow. This will help you to pass the stone or stone fragments.  Strain  all urine through the provided strainer. Keep all particulate matter and stones for your health care provider to see. The stone causing the pain may be as small as a grain of salt. It is very important to use the strainer each and every time you pass your urine. The collection of your stone will allow your health care provider to analyze it and verify that a stone has actually passed. The stone analysis will often identify what you can do to reduce the incidence of recurrences.  Only take over-the-counter or prescription medicines for pain, discomfort, or fever as directed by your health care provider.  Make a follow-up appointment with your health care provider as directed.  Get follow-up X-rays if required. The absence of pain does not always mean that the stone has passed. It may have only stopped moving. If the urine remains completely obstructed, it can cause loss of kidney function or even complete destruction of the kidney. It is your responsibility to make sure X-rays and follow-ups are completed. Ultrasounds of the kidney can show blockages and the status of the kidney. Ultrasounds are not associated with any radiation and can be performed easily in a matter of minutes. SEEK MEDICAL CARE IF:  You experience pain that is progressive and unresponsive to any pain medicine you have been prescribed. SEEK IMMEDIATE MEDICAL CARE IF:   Pain cannot be controlled with the prescribed medicine.  You have a fever or shaking chills.  The severity or intensity of pain increases over 18 hours and is not relieved by pain medicine.  You develop a new onset of abdominal pain.  You feel faint or pass  out.  You are unable to urinate. MAKE SURE YOU:   Understand these instructions.  Will watch your condition.  Will get help right away if you are not doing well or get worse. Document Released: 01/27/2005 Document Revised: 09/29/2012 Document Reviewed: 06/30/2012 [facility] Patient Information 2014  [place].     Nausea and Vomiting Nausea is a sick feeling that often comes before throwing up (vomiting). Vomiting is a reflex where stomach contents come out of your mouth. Vomiting can cause severe loss of body fluids (dehydration). Children and elderly adults can become dehydrated quickly, especially if they also have diarrhea. Nausea and vomiting are symptoms of a condition or disease. It is important to find the cause of your symptoms. CAUSES   Direct irritation of the stomach lining. This irritation can result from increased acid production (gastroesophageal reflux disease), infection, food poisoning, taking certain medicines (such as nonsteroidal anti-inflammatory drugs), alcohol use, or tobacco use.  Signals from the brain.These signals could be caused by a headache, heat exposure, an inner ear disturbance, increased pressure in the brain from injury, infection, a tumor, or a concussion, pain, emotional stimulus, or metabolic problems.  An obstruction in the gastrointestinal tract (bowel obstruction).  Illnesses such as diabetes, hepatitis, gallbladder problems, appendicitis, kidney problems, cancer, sepsis, atypical symptoms of a heart attack, or eating disorders.  Medical treatments such as chemotherapy and radiation.  Receiving medicine that makes you sleep (general anesthetic) during surgery. DIAGNOSIS Your caregiver may ask for tests to be done if the problems do not improve after a few days. Tests may also be done if symptoms are severe or if the reason for the nausea and vomiting is not clear. Tests may include:  Urine tests.  Blood tests.  Stool tests.  Cultures (to look for evidence of infection).  X-rays or other imaging studies. Test results can help your caregiver make decisions about treatment or the need for additional tests. TREATMENT You need to stay well hydrated. Drink frequently but in small amounts.You may wish to drink water, sports drinks, clear  broth, or eat frozen ice pops or gelatin dessert to help stay hydrated.When you eat, eating slowly may help prevent nausea.There are also some antinausea medicines that may help prevent nausea. HOME CARE INSTRUCTIONS   Take all medicine as directed by your caregiver.  If you do not have an appetite, do not force yourself to eat. However, you must continue to drink fluids.  If you have an appetite, eat a normal diet unless your caregiver tells you differently.  Eat a variety of complex carbohydrates (rice, wheat, potatoes, bread), lean meats, yogurt, fruits, and vegetables.  Avoid high-fat foods because they are more difficult to digest.  Drink enough water and fluids to keep your urine clear or pale yellow.  If you are dehydrated, ask your caregiver for specific rehydration instructions. Signs of dehydration may include:  Severe thirst.  Dry lips and mouth.  Dizziness.  Dark urine.  Decreasing urine frequency and amount.  Confusion.  Rapid breathing or pulse. SEEK IMMEDIATE MEDICAL CARE IF:   You have blood or brown flecks (like coffee grounds) in your vomit.  You have black or bloody stools.  You have a severe headache or stiff neck.  You are confused.  You have severe abdominal pain.  You have chest pain or trouble breathing.  You do not urinate at least once every 8 hours.  You develop cold or clammy skin.  You continue to vomit for longer than 24 to  48 hours.  You have a fever. MAKE SURE YOU:   Understand these instructions.  Will watch your condition.  Will get help right away if you are not doing well or get worse. Document Released: 01/27/2005 Document Revised: 04/21/2011 Document Reviewed: 06/26/2010 [facility] Patient Information 2014 [place], [place].     Narcotic and benzodiazepine use may cause drowsiness, slowed breathing or dependence.  Please use with caution and do not drive, operate machinery or watch young children alone while taking  them.  Taking combinations of these medications or drinking alcohol will potentiate these effects.

## 2013-04-13 ENCOUNTER — Other Ambulatory Visit: Payer: Self-pay | Admitting: Urology

## 2013-04-13 NOTE — Progress Notes (Signed)
Surgery on 04/18/13.  Need orders in EPIC.  Thank You. + 

## 2013-04-14 ENCOUNTER — Encounter (HOSPITAL_COMMUNITY)
Admission: RE | Admit: 2013-04-14 | Discharge: 2013-04-14 | Disposition: A | Discharge status: Home / Self Care | Payer: Medicare Other | Source: Ambulatory Visit | Attending: Urology | Admitting: Urology

## 2013-04-14 ENCOUNTER — Ambulatory Visit (HOSPITAL_COMMUNITY)
Admission: RE | Admit: 2013-04-14 | Discharge: 2013-04-14 | Disposition: A | Discharge status: Home / Self Care | Payer: Medicare Other | Source: Ambulatory Visit | Attending: Urology | Admitting: Urology

## 2013-04-14 ENCOUNTER — Encounter (HOSPITAL_COMMUNITY): Payer: Self-pay

## 2013-04-14 ENCOUNTER — Encounter (HOSPITAL_COMMUNITY): Payer: Self-pay | Admitting: Pharmacy Technician

## 2013-04-14 DIAGNOSIS — Z01818 Encounter for other preprocedural examination: Secondary | ICD-10-CM | POA: Insufficient documentation

## 2013-04-14 DIAGNOSIS — Z01812 Encounter for preprocedural laboratory examination: Secondary | ICD-10-CM | POA: Insufficient documentation

## 2013-04-14 HISTORY — DX: Personal history of urinary calculi: Z87.442

## 2013-04-14 HISTORY — DX: Acute myocardial infarction, unspecified: I21.9

## 2013-04-14 LAB — BASIC METABOLIC PANEL
BUN: 28 mg/dL — ABNORMAL HIGH (ref 6–23)
CALCIUM: 9.7 mg/dL (ref 8.4–10.5)
CO2: 25 meq/L (ref 19–32)
CREATININE: 1.19 mg/dL (ref 0.50–1.35)
Chloride: 96 mEq/L (ref 96–112)
GFR calc Af Amer: 67 mL/min — ABNORMAL LOW (ref >90)
GFR, EST NON AFRICAN AMERICAN: 58 mL/min — AB (ref >90)
Glucose, Bld: 202 mg/dL — ABNORMAL HIGH (ref 70–99)
Potassium: 4.1 mEq/L (ref 3.7–5.3)
Sodium: 138 mEq/L (ref 137–147)

## 2013-04-14 NOTE — Pre-Procedure Instructions (Signed)
CARDIOLOGY OFFICE NOTES IN EPIC FROM DR. WALL 03/03/12. CXR REPORT IN EPIC FROM 04/08/13 VISIT TO ER WAS ABNORMAL - CXR REPEATED TODAY PREOP AT [facility]. CBC AND EKG REPORTS IN EPIC FROM 04/08/13 OK FOR THIS SURGERY.  BMET WAS DRAWN PREOP TODAY - AS PER ORDERS DR. BORDEN.

## 2013-04-14 NOTE — Patient Instructions (Signed)
   YOUR SURGERY IS SCHEDULED AT [facility]  ON:  Monday  3/9  REPORT TO  SHORT STAY CENTER AT:  1:45 PM      PHONE # FOR SHORT STAY IS [phone]  DO NOT EAT ANYTHING AFTER MIDNIGHT THE NIGHT BEFORE YOUR SURGERY.  YOU MAY BRUSH YOUR TEETH, NO FOOD, NO CHEWING GUM, NO MINTS, NO CANDIES, NO CHEWING TOBACCO. YOU MAY HAVE CLEAR LIQUIDS TO DRINK FROM MIDNIGHT UNTIL 9:30 AM DAY OF SURGERY - LIKE WATER, GINGERALE, SPRITE.   NOTHING TO DRINK AFTER 9:30 AM DAY OF YOUR SURGERY.  PLEASE TAKE THE FOLLOWING MEDICATIONS THE AM OF YOUR SURGERY WITH A FEW SIPS OF WATER:  AMLODIPINE ( NORVASC ), HYDRALAZINE ( APRESOLINE ), METOPROLOL.   IF YOU ARE DIABETIC:  DO NOT TAKE ANY DIABETIC MEDICATIONS THE AM OF YOUR SURGERY.     DO NOT BRING VALUABLES, MONEY, CREDIT CARDS.  DO NOT WEAR JEWELRY, MAKE-UP, NAIL POLISH AND NO METAL PINS OR CLIPS IN YOUR HAIR. CONTACT LENS, DENTURES / PARTIALS, GLASSES SHOULD NOT BE WORN TO SURGERY AND IN MOST CASES-HEARING AIDS WILL NEED TO BE REMOVED.  BRING YOUR GLASSES CASE, ANY EQUIPMENT NEEDED FOR YOUR CONTACT LENS. FOR PATIENTS ADMITTED TO THE HOSPITAL--CHECK OUT TIME THE DAY OF DISCHARGE IS 11:00 AM.  ALL INPATIENT ROOMS ARE PRIVATE - WITH BATHROOM, TELEPHONE, TELEVISION AND WIFI INTERNET.  IF YOU ARE BEING DISCHARGED THE SAME DAY OF YOUR SURGERY--YOU CAN NOT DRIVE YOURSELF HOME--AND SHOULD NOT GO HOME ALONE BY TAXI OR BUS.  NO DRIVING OR OPERATING MACHINERY, OR MAKING LEGAL DECISIONS FOR 24 HOURS FOLLOWING ANESTHESIA / PAIN MEDICATIONS.  PLEASE MAKE ARRANGEMENTS FOR SOMEONE TO BE WITH YOU AT HOME THE FIRST 24 HOURS AFTER SURGERY. RESPONSIBLE DRIVER'S NAME / PHONE                                                  PT'S SISTER Brandon INSTRUCTIONS MAY RESULT IN THE CANCELLATION OF YOUR SURGERY. PLEASE BE AWARE THAT YOU MAY NEED ADDITIONAL BLOOD DRAWN DAY OF YOUR SURGERY  PATIENT SIGNATURE_________________________________

## 2013-04-14 NOTE — Pre-Procedure Instructions (Signed)
BMET REPORT ABNORMAL - REPORT ROUTED IN EPIC TO DR. Alinda Money FOR REVIEW

## 2013-04-16 NOTE — H&P (Signed)
History of Present Illness [name] is a 79 year old who has been previously followed by Dr. Reece Agar and Dr. Rosana Hoes and has the following urologic history:    1) Urolithiasis: He has a history of calcium oxalate nephrolithiasis and has previously been treated by [facility].  Mar 2013: L [facility] for proximal ureteral stone  June 2014: R [facility] for 11 mm renal stone    2) BPH/LUTS:  Current treatment: Flomax 0.4 mg (prescribed by Dr. Lenna Gilford)    3) Erectile dysfunction: He has had good response to PDE-5 inhibitors but has had significant side effects including myalgias.  Current treatment: PGE 10 mcg    4) Prostate cancer screening:  Family history: None  Last PSA: 2.48 (May 2014)    Interval history:    He follows up today for a new complaint of left-sided abdominal pain. He presented to the emergency department on 04/09/13 with complaints of chest pain and bilateral shoulder pain. Ultimately, he did develop some pain in his left upper quadrant and around his left flank. He underwent an evaluation including a cardiac evaluation which was negative for any underlying cardiac etiology of his symptoms. A subsequent CT scan of the abdomen and pelvis did demonstrate a 6 mm distal left ureteral calculus although no hydronephrosis. His chest and shoulder pain has now resolved. He has had nausea and vomiting although this is now relatively well controlled. He has noted gross hematuria and urinary frequency but denies any fever.   Past Medical History Problems  1. History of diabetes mellitus (V12.29) 2. History of hypertension (V12.59)  Surgical History Problems  1. History of Cystoscopy With Insertion Of Ureteral Stent Left 2. History of Cystoscopy With Insertion Of Ureteral Stent Right 3. History of Lithotripsy 4. History of Lithotripsy 5. History of Lithotripsy  Current Meds 1. AmLODIPine Besylate 10 MG Oral Tablet;  Therapy: [id number] to Recorded 2. Aspirin 325 MG Oral Tablet;  TAKE 1 TABLET DAILY;  Therapy: (Recorded:30Sep2011) to Recorded 3. Glimepiride 4 MG Oral Tablet;  Therapy: [id number] to Recorded 4. HydrALAZINE HCl - 25 MG Oral Tablet;  Therapy: [id number] to Recorded 5. Lisinopril-Hydrochlorothiazide 20-25 MG Oral Tablet;  Therapy: 22Jan2014 to Recorded 6. MetFORMIN HCl - 500 MG Oral Tablet;  Therapy: (Recorded:25Apr2008) to Recorded 7. Metoprolol Tartrate 50 MG Oral Tablet;  Therapy: [id number] to Recorded 8. Ondansetron 8 MG Oral Tablet Dispersible;  Therapy: [phone] to Recorded 9. Oxycodone-Acetaminophen 5-325 MG Oral Tablet;  Therapy: [phone] to Recorded 10. Potassium Chloride Crys ER 20 MEQ Oral Tablet Extended Release;   Therapy: [id number] to Recorded 11. Pravastatin Sodium 40 MG Oral Tablet;   Therapy: 22Jan2014 to Recorded 12. Prostaglandin E1 20ug/mL; INJECT AS DIRECTED AS NEEDED.  DO NOT   USE MORE   THAN EVERY OTHER DAYOR 3 TIMESPER WEEK;   Therapy: 14Apr2011 to (Evaluate:05Nov2014)  Requested for: [id number]; Last   Rx:08Jul2014 Ordered  Allergies Medication  1. Codeine Derivatives 2. Morphine Derivatives  Family History Problems  1. Family history of Family Health Status Number Of Children   1 son and 1 daughter  Social History Problems  1. Denied: History of Alcohol Use 2. Denied: History of Caffeine Use 3. Denied: History of Former Smoker 4. Marital History - Currently Married 5. Never A Smoker  Vitals Vital Signs [Data Includes: Last 1 Day]  Recorded: [id number] 03:31PM  Weight: 225 lb  BMI Calculated: 30.52 BSA Calculated: 2.24 Blood Pressure: 172 / 78 Temperature: 97.6 F Heart Rate: 92  Physical Exam Constitutional: Well nourished and well  developed . No acute distress.  ENT:. The ears and nose are normal in appearance.  Neck: The appearance of the neck is normal and no neck mass is present.  Pulmonary: No respiratory distress, normal respiratory rhythm and effort and clear bilateral breath sounds.   Cardiovascular: Heart rate and rhythm are normal . No peripheral edema.  Abdomen: The abdomen is soft and nontender. No masses are palpated. Moderate tenderness in the LUQ is present. No CVA tenderness. No hernias are palpable. No hepatosplenomegaly noted.  Lymphatics: The femoral and inguinal nodes are not enlarged or tender.  Skin: Normal skin turgor, no visible rash and no visible skin lesions.  Neuro/Psych:. Mood and affect are appropriate.    Results/Data Urine [Data Includes: Last 1 Day]   [id number]  COLOR YELLOW   APPEARANCE CLEAR   SPECIFIC GRAVITY 1.020   pH 7.0   GLUCOSE NEG mg/dL  BILIRUBIN NEG   KETONE NEG mg/dL  BLOOD NEG   PROTEIN NEG mg/dL  UROBILINOGEN 0.2 mg/dL  NITRITE NEG   LEUKOCYTE ESTERASE NEG    I reviewed his medical records from the hospital. I independently reviewed his CT scan with findings as dictated above. Urinalysis demonstrated 0-2 white blood cells, 0-2 red blood cells, and rare bacteria but also squamous epithelial cells consistent with contaminant. White blood count was 7.7, serum creatinine was 1.12    I independently reviewed his KUB x-ray today. This does demonstrate a calcification in the vicinity of the distal left ureter although it is difficult to determine whether this is his actual stone which was seen close to the UVJ on his CT scan. No other calcifications are identified along the course of the ureters or renal contours bilaterally except for possible small stone fragments over the right kidney which are similar to his prior KUB.   Assessment Assessed  1. Calculus of ureter (592.1)  Plan Calculus of ureter  1. Start: Ondansetron 4 MG Oral Tablet Dispersible; TAKE 4 MG Every 8 hours PRN nausea 2. Start: OxyCODONE HCl - 5 MG Oral Capsule; TAKE 1 CAPSULE Every 4 hours PRN 3. Follow-up Office  Follow-up - Will call to schedule surgery  Status: Hold For -  Appointment,Date of Service  Requested for: [id number] Health Maintenance  4. UA  With REFLEX; [Do Not Release]; Status:Complete;   DoneBX:[id number] 03:19PM  Discussion/Summary 1. Left ureteral calculus: We discussed his recent evaluation in symptoms. Although his symptoms are atypical, at least some of his symptoms do indeed appear to be related to his left ureteral calculus. He currently is taking cancellous and for his BPH and I recommended that he continue this medication and strain his urine. He also has been provided a prescription for oxycodone and Zofran today. He would like to try to pass a stone and avoid surgical intervention. He understands that he should call should he develop fever, persistent nausea/vomiting, or uncontrolled pain. We also discussed tentatively scheduling elective treatment for his ureteral stone review treatment options today. Considering the location of the stone and his desire to become stone free, we discussed proceeding with left ureteroscopic laser lithotripsy. He understands the potential risks, complications, and expected recovery process associated with this procedure. He also understands the potential need for a postoperative ureteral stent. He gives his informed consent and this will be scheduled. He will notify me if he passes the stone prior to his planned surgery next week.    2. BPH/LUTS: He will continue current therapy with tamsulosin and plan a follow-up as  scheduled with a PVR/IPSS questionnaire.    3. Erectile dysfunction: He will continue to utilize Trimix prn.    4. Prostate cancer screening : He will continue with yearly digital rectal exams and this is due for next summer.    Cc: Dr. Teressa Lower     Signatures Electronically signed by : Raynelle Bring, M.D.; Apr 12 2013  5:56PM EST

## 2013-04-18 ENCOUNTER — Encounter (HOSPITAL_COMMUNITY): Payer: Self-pay | Admitting: *Deleted

## 2013-04-18 ENCOUNTER — Ambulatory Visit (HOSPITAL_COMMUNITY): Payer: Medicare Other | Admitting: Anesthesiology

## 2013-04-18 ENCOUNTER — Encounter (HOSPITAL_COMMUNITY): Payer: Medicare Other | Admitting: Anesthesiology

## 2013-04-18 ENCOUNTER — Encounter (HOSPITAL_COMMUNITY)
Admission: RE | Disposition: A | Discharge status: Home / Self Care | Payer: Self-pay | Source: Ambulatory Visit | Attending: Urology

## 2013-04-18 ENCOUNTER — Ambulatory Visit (HOSPITAL_COMMUNITY)
Admission: RE | Admit: 2013-04-18 | Discharge: 2013-04-18 | Disposition: A | Discharge status: Home / Self Care | Payer: Medicare Other | Source: Ambulatory Visit | Attending: Urology | Admitting: Urology

## 2013-04-18 DIAGNOSIS — Z79899 Other long term (current) drug therapy: Secondary | ICD-10-CM | POA: Insufficient documentation

## 2013-04-18 DIAGNOSIS — E119 Type 2 diabetes mellitus without complications: Secondary | ICD-10-CM | POA: Insufficient documentation

## 2013-04-18 DIAGNOSIS — N201 Calculus of ureter: Secondary | ICD-10-CM | POA: Insufficient documentation

## 2013-04-18 DIAGNOSIS — N529 Male erectile dysfunction, unspecified: Secondary | ICD-10-CM | POA: Insufficient documentation

## 2013-04-18 DIAGNOSIS — Z7982 Long term (current) use of aspirin: Secondary | ICD-10-CM | POA: Insufficient documentation

## 2013-04-18 DIAGNOSIS — N400 Enlarged prostate without lower urinary tract symptoms: Secondary | ICD-10-CM | POA: Insufficient documentation

## 2013-04-18 DIAGNOSIS — I10 Essential (primary) hypertension: Secondary | ICD-10-CM | POA: Insufficient documentation

## 2013-04-18 HISTORY — PX: HOLMIUM LASER APPLICATION: SHX5852

## 2013-04-18 HISTORY — PX: CYSTOSCOPY WITH RETROGRADE PYELOGRAM, URETEROSCOPY AND STENT PLACEMENT: SHX5789

## 2013-04-18 LAB — GLUCOSE, CAPILLARY
Glucose-Capillary: 159 mg/dL — ABNORMAL HIGH (ref 70–99)
Glucose-Capillary: 169 mg/dL — ABNORMAL HIGH (ref 70–99)

## 2013-04-18 SURGERY — CYSTOURETEROSCOPY, WITH RETROGRADE PYELOGRAM AND STENT INSERTION
Anesthesia: General | Site: Ureter | Laterality: Left

## 2013-04-18 MED ORDER — LACTATED RINGERS IV SOLN: INTRAVENOUS | Status: DC

## 2013-04-18 MED ORDER — ONDANSETRON HCL 4 MG/2ML IJ SOLN
INTRAMUSCULAR | Status: DC | PRN
  Administered 2013-04-18: 4 mg via INTRAVENOUS

## 2013-04-18 MED ORDER — 0.9 % SODIUM CHLORIDE (POUR BTL) OPTIME
TOPICAL | Status: DC | PRN
  Administered 2013-04-18: 1000 mL

## 2013-04-18 MED ORDER — CIPROFLOXACIN IN D5W 400 MG/200ML IV SOLN
400.0000 mg | INTRAVENOUS | Status: AC
  Administered 2013-04-18: 400 mg via INTRAVENOUS

## 2013-04-18 MED ORDER — LACTATED RINGERS IV SOLN
INTRAVENOUS | Status: DC
  Administered 2013-04-18: 100 mL via INTRAVENOUS

## 2013-04-18 MED ORDER — SODIUM CHLORIDE 0.9 % IR SOLN
Status: DC | PRN
  Administered 2013-04-18: 3000 mL

## 2013-04-18 MED ORDER — CIPROFLOXACIN HCL 250 MG PO TABS: 250.0000 mg | ORAL_TABLET | Freq: Two times a day (BID) | ORAL | Status: DC

## 2013-04-18 MED ORDER — FENTANYL CITRATE 0.05 MG/ML IJ SOLN
INTRAMUSCULAR | Status: DC | PRN
  Administered 2013-04-18 (×2): 50 ug via INTRAVENOUS

## 2013-04-18 MED ORDER — HYDROCODONE-ACETAMINOPHEN 5-325 MG PO TABS: 1.0000 | ORAL_TABLET | Freq: Four times a day (QID) | ORAL | Status: DC | PRN

## 2013-04-18 MED ORDER — FENTANYL CITRATE 0.05 MG/ML IJ SOLN
25.0000 ug | INTRAMUSCULAR | Status: DC | PRN
  Administered 2013-04-18 (×2): 50 ug via INTRAVENOUS

## 2013-04-18 MED ORDER — ONDANSETRON HCL 4 MG/2ML IJ SOLN
INTRAMUSCULAR | Status: AC
  Filled 2013-04-18: qty 2

## 2013-04-18 MED ORDER — CIPROFLOXACIN IN D5W 400 MG/200ML IV SOLN
INTRAVENOUS | Status: AC
  Filled 2013-04-18: qty 200

## 2013-04-18 MED ORDER — PROPOFOL 10 MG/ML IV BOLUS
INTRAVENOUS | Status: AC
  Filled 2013-04-18: qty 20

## 2013-04-18 MED ORDER — IOHEXOL 300 MG/ML  SOLN
INTRAMUSCULAR | Status: DC | PRN
  Administered 2013-04-18: 1 mL via INTRAVENOUS

## 2013-04-18 MED ORDER — PROPOFOL 10 MG/ML IV BOLUS
INTRAVENOUS | Status: DC | PRN
  Administered 2013-04-18: 50 mg via INTRAVENOUS
  Administered 2013-04-18: 150 mg via INTRAVENOUS

## 2013-04-18 MED ORDER — FENTANYL CITRATE 0.05 MG/ML IJ SOLN
INTRAMUSCULAR | Status: AC
  Filled 2013-04-18: qty 2

## 2013-04-18 MED ORDER — EPHEDRINE SULFATE 50 MG/ML IJ SOLN
INTRAMUSCULAR | Status: DC | PRN
  Administered 2013-04-18: 10 mg via INTRAVENOUS

## 2013-04-18 SURGICAL SUPPLY — 17 items
BAG URO CATCHER STRL LF (DRAPE) ×4 IMPLANT
BASKET ZERO TIP NITINOL 2.4FR (BASKET) IMPLANT
BSKT STON RTRVL ZERO TP 2.4FR (BASKET)
CATH INTERMIT  6FR 70CM (CATHETERS) ×2 IMPLANT
CLOTH BEACON ORANGE TIMEOUT ST (SAFETY) ×4 IMPLANT
DRAPE CAMERA CLOSED 9X96 (DRAPES) ×4 IMPLANT
EXTRACTOR STONE NITINOL NGAGE (UROLOGICAL SUPPLIES) ×2 IMPLANT
FIBER LASER FLEXIVA 365 (UROLOGICAL SUPPLIES) ×2 IMPLANT
GLOVE BIOGEL M STRL SZ7.5 (GLOVE) ×4 IMPLANT
GOWN STRL REUS W/TWL LRG LVL3 (GOWN DISPOSABLE) ×8 IMPLANT
GUIDEWIRE ANG ZIPWIRE 038X150 (WIRE) IMPLANT
GUIDEWIRE STR DUAL SENSOR (WIRE) ×4 IMPLANT
MANIFOLD NEPTUNE II (INSTRUMENTS) ×4 IMPLANT
PACK CYSTO (CUSTOM PROCEDURE TRAY) ×4 IMPLANT
STENT CONTOUR 6FRX24X.038 (STENTS) ×2 IMPLANT
TUBING CONNECTING 10 (TUBING) ×3 IMPLANT
TUBING CONNECTING 10' (TUBING) ×1

## 2013-04-18 NOTE — Discharge Instructions (Addendum)
1. You may see some blood in the urine and may have some burning with urination for 48-72 hours. You also may notice that you have to urinate more frequently or urgently after your procedure which is normal.  2. You should call should you develop an inability urinate, fever > 101, persistent nausea and vomiting that prevents you from eating or drinking to stay hydrated.  3. If you have a stent, you will likely urinate more frequently and urgently until the stent is removed and you may experience some discomfort/pain in the lower abdomen and flank especially when urinating. You may take pain medication prescribed to you if needed for pain. You may also intermittently have blood in the urine until the stent is removed.   ** You may remove your stent on Friday morning.  Simply pull on the string at the end of the penis and the stent will easily come out. This may be best done in a warm shower as some urine may come out with the stent.  If you do have some pain after stent removal, take your prescribed pain medication and this will typically resolve.**

## 2013-04-18 NOTE — Op Note (Signed)
Preoperative diagnosis: Left ureteral calculus  Postoperative diagnosis: Left ureteral calculus  Procedure:  1. Cystoscopy 2. Left ureteroscopy and stone removal 3. Ureteroscopic laser lithotripsy 4. Left ureteral stent placement (6 x 24)  5. Left retrograde pyelography with interpretation  Surgeon: Pryor Curia. M.D.  Anesthesia: General  Complications: None  Intraoperative findings: Left retrograde pyelography demonstrated a filling defect within the distal left ureter consistent with the patient's known calculus without other abnormalities.  EBL: Minimal  Specimens: 1. Left ureteral calculus  Disposition of specimens: [facility] for stone analysis  Indication: [name] is a 79 y.o. year old patient with urolithiasis. After reviewing the management options for treatment, the patient elected to proceed with the above surgical procedure(s). We have discussed the potential benefits and risks of the procedure, side effects of the proposed treatment, the likelihood of the patient achieving the goals of the procedure, and any potential problems that might occur during the procedure or recuperation. Informed consent has been obtained.  Description of procedure:  The patient was taken to the operating room and general anesthesia was induced.  The patient was placed in the dorsal lithotomy position, prepped and draped in the usual sterile fashion, and preoperative antibiotics were administered. A preoperative time-out was performed.   Cystourethroscopy was performed.  The patient's urethra was examined and was normal except for bilateral prostatic hypertrophy. The bladder was then systematically examined in its entirety. There was no evidence for any bladder tumors, stones, or other mucosal pathology.    Attention then turned to the left ureteral orifice and a ureteral catheter was used to intubate the ureteral orifice.  Omnipaque contrast was injected  through the ureteral catheter and a retrograde pyelogram was performed with findings as dictated above.  A 0.38 sensor guidewire was then advanced up the left ureter into the renal pelvis under fluoroscopic guidance. The 6 Fr semirigid ureteroscope was then advanced into the ureter next to the guidewire and the calculus was identified.   The stone was then fragmented with the 365 micron holmium laser fiber on a setting of 0.5 J and frequency of 5 Hz.   All stones were then removed from the ureter with an N Gauge basket.  Reinspection of the ureter revealed no remaining visible stones or fragments.   The wire was then backloaded through the cystoscope and a ureteral stent was advance over the wire using Seldinger technique.  The stent was positioned appropriately under fluoroscopic and cystoscopic guidance.  The wire was then removed with an adequate stent curl noted in the renal pelvis as well as in the bladder.  The bladder was then emptied and the procedure ended.  The patient appeared to tolerate the procedure well and without complications.  The patient was able to be awakened and transferred to the recovery unit in satisfactory condition.

## 2013-04-18 NOTE — Anesthesia Preprocedure Evaluation (Signed)
Anesthesia Evaluation  Patient identified by MRN, date of birth, ID band Patient awake    Reviewed: Allergy & Precautions, H&P , NPO status , Patient's Chart, lab work & pertinent test results, reviewed documented beta blocker date and time   Airway Mallampati: II TM Distance: >3 FB Neck ROM: full    Dental  (+) Missing, Dental Advisory Given Missing teeth upper sides:   Pulmonary neg pulmonary ROS,  breath sounds clear to auscultation  Pulmonary exam normal       Cardiovascular hypertension, Pt. on medications and Pt. on home beta blockers + CAD and + Past MI Rhythm:regular Rate:Normal  Syncope.  MI more than 10 years ago treated medically   Neuro/Psych negative neurological ROS  negative psych ROS   GI/Hepatic negative GI ROS, Neg liver ROS,   Endo/Other  diabetes, Well Controlled, Type 2, Oral Hypoglycemic Agents  Renal/GU negative Renal ROS  negative genitourinary   Musculoskeletal   Abdominal   Peds  Hematology negative hematology ROS (+)   Anesthesia Other Findings   Reproductive/Obstetrics negative OB ROS                           Anesthesia Physical Anesthesia Plan  ASA: III  Anesthesia Plan: General   Post-op Pain Management:    Induction: Intravenous  Airway Management Planned: LMA  Additional Equipment:   Intra-op Plan:   Post-operative Plan:   Informed Consent: I have reviewed the patients History and Physical, chart, labs and discussed the procedure including the risks, benefits and alternatives for the proposed anesthesia with the patient or authorized representative who has indicated his/her understanding and acceptance.   Dental Advisory Given  Plan Discussed with: CRNA and Surgeon  Anesthesia Plan Comments:         Anesthesia Quick Evaluation

## 2013-04-18 NOTE — Anesthesia Postprocedure Evaluation (Signed)
  Anesthesia Post-op Note  [name]  Procedure(s) Performed: Procedure(s) (LRB): CYSTOSCOPY WITH LEFT RETROGRADE PYELOGRAM, LEFT URETEROSCOPY AND LASER LITHOTRIPSY LEFT STENT PLACEMENT (Left) HOLMIUM LASER APPLICATION (Left)  Patient Location: PACU  Anesthesia Type: General  Level of Consciousness: awake and alert   Airway and Oxygen Therapy: Patient Spontanous Breathing  Post-op Pain: mild  Post-op Assessment: Post-op Vital signs reviewed, Patient's Cardiovascular Status Stable, Respiratory Function Stable, Patent Airway and No signs of Nausea or vomiting  Last Vitals:  Filed Vitals:   04/18/13 1630  BP: 193/92  Pulse: 74  Temp:   Resp: 19    Post-op Vital Signs: stable   Complications: No apparent anesthesia complications

## 2013-04-18 NOTE — Preoperative (Signed)
Beta Blockers   Reason not to administer Beta Blockers:Not Applicable 

## 2013-04-18 NOTE — Interval H&P Note (Signed)
History and Physical Interval Note:  04/18/2013 2:49 PM  [name]  has presented today for surgery, with the diagnosis of LEFT URETERAL CALCULUS  The various methods of treatment have been discussed with the patient and family. After consideration of risks, benefits and other options for treatment, the patient has consented to  Procedure(s): CYSTOSCOPY WITH RETROGRADE PYELOGRAM, URETEROSCOPY AND POSSIBLE STENT PLACEMENT (Left) HOLMIUM LASER APPLICATION (Left) as a surgical intervention .  The patient's history has been reviewed, patient examined, no change in status, stable for surgery.  I have reviewed the patient's chart and labs.  Questions were answered to the patient's satisfaction.     Osmany Azer,LES

## 2013-04-18 NOTE — Transfer of Care (Signed)
Immediate Anesthesia Transfer of Care Note  [name]  Procedure(s) Performed: Procedure(s) (LRB): CYSTOSCOPY WITH LEFT RETROGRADE PYELOGRAM, LEFT URETEROSCOPY AND LASER LITHOTRIPSY LEFT STENT PLACEMENT (Left) HOLMIUM LASER APPLICATION (Left)  Patient Location: PACU  Anesthesia Type: General  Level of Consciousness: sedated, patient cooperative and responds to stimulation  Airway & Oxygen Therapy: Patient Spontanous Breathing and Patient connected to face mask oxgen  Post-op Assessment: Report given to PACU RN and Post -op Vital signs reviewed and stable  Post vital signs: Reviewed and stable  Complications: No apparent anesthesia complications

## 2013-04-18 NOTE — Progress Notes (Signed)
Called and informed Dr Landry Dyke that patient had 4 ounces of gingerale at 1100 today. Surgery at 1530. Patient is okay for surgery today.

## 2013-04-19 ENCOUNTER — Encounter (HOSPITAL_COMMUNITY): Payer: Self-pay | Admitting: Urology

## 2013-04-20 ENCOUNTER — Other Ambulatory Visit: Payer: Self-pay | Admitting: Pulmonary Disease

## 2013-04-23 ENCOUNTER — Encounter (HOSPITAL_COMMUNITY): Payer: Self-pay | Admitting: Emergency Medicine

## 2013-04-23 ENCOUNTER — Emergency Department (HOSPITAL_COMMUNITY)
Admission: EM | Admit: 2013-04-23 | Discharge: 2013-04-23 | Disposition: A | Discharge status: Home / Self Care | Payer: Medicare Other | Attending: Emergency Medicine | Admitting: Emergency Medicine

## 2013-04-23 DIAGNOSIS — E119 Type 2 diabetes mellitus without complications: Secondary | ICD-10-CM | POA: Insufficient documentation

## 2013-04-23 DIAGNOSIS — Z8659 Personal history of other mental and behavioral disorders: Secondary | ICD-10-CM | POA: Insufficient documentation

## 2013-04-23 DIAGNOSIS — E785 Hyperlipidemia, unspecified: Secondary | ICD-10-CM | POA: Insufficient documentation

## 2013-04-23 DIAGNOSIS — Z87448 Personal history of other diseases of urinary system: Secondary | ICD-10-CM | POA: Insufficient documentation

## 2013-04-23 DIAGNOSIS — Z792 Long term (current) use of antibiotics: Secondary | ICD-10-CM | POA: Insufficient documentation

## 2013-04-23 DIAGNOSIS — Z791 Long term (current) use of non-steroidal anti-inflammatories (NSAID): Secondary | ICD-10-CM | POA: Insufficient documentation

## 2013-04-23 DIAGNOSIS — R339 Retention of urine, unspecified: Secondary | ICD-10-CM

## 2013-04-23 DIAGNOSIS — M1990 Unspecified osteoarthritis, unspecified site: Secondary | ICD-10-CM | POA: Insufficient documentation

## 2013-04-23 DIAGNOSIS — Z7982 Long term (current) use of aspirin: Secondary | ICD-10-CM | POA: Insufficient documentation

## 2013-04-23 DIAGNOSIS — Z79899 Other long term (current) drug therapy: Secondary | ICD-10-CM | POA: Insufficient documentation

## 2013-04-23 DIAGNOSIS — Z860100 Personal history of colon polyps, unspecified: Secondary | ICD-10-CM | POA: Insufficient documentation

## 2013-04-23 DIAGNOSIS — Z87442 Personal history of urinary calculi: Secondary | ICD-10-CM | POA: Insufficient documentation

## 2013-04-23 DIAGNOSIS — I252 Old myocardial infarction: Secondary | ICD-10-CM | POA: Insufficient documentation

## 2013-04-23 DIAGNOSIS — Z8719 Personal history of other diseases of the digestive system: Secondary | ICD-10-CM | POA: Insufficient documentation

## 2013-04-23 DIAGNOSIS — I2510 Atherosclerotic heart disease of native coronary artery without angina pectoris: Secondary | ICD-10-CM | POA: Insufficient documentation

## 2013-04-23 DIAGNOSIS — I10 Essential (primary) hypertension: Secondary | ICD-10-CM | POA: Insufficient documentation

## 2013-04-23 LAB — URINALYSIS, ROUTINE W REFLEX MICROSCOPIC
Bilirubin Urine: NEGATIVE
Glucose, UA: NEGATIVE mg/dL
KETONES UR: NEGATIVE mg/dL
Leukocytes, UA: NEGATIVE
NITRITE: NEGATIVE
Protein, ur: 30 mg/dL — AB
Specific Gravity, Urine: 1.01 (ref 1.005–1.030)
UROBILINOGEN UA: 0.2 mg/dL (ref 0.0–1.0)
pH: 6 (ref 5.0–8.0)

## 2013-04-23 LAB — URINE MICROSCOPIC-ADD ON

## 2013-04-23 NOTE — ED Provider Notes (Signed)
CSN: [id number]     Arrival date & time 04/23/13  0121 History   First MD Initiated Contact with Patient 04/23/13 0156     Chief Complaint  Patient presents with  . Urinary Retention     (Consider location/radiation/quality/duration/timing/severity/associated sxs/prior Treatment) HPI 79 yo male presents to the ER from home with complaint of suprapubic pain and inability to urinate.  Pt had stent placed for kidney stone on Tuesday, required having foley catheter Tues-Thursday.  Since having foley removed, he has only had "dribbling".  Past Medical History  Diagnosis Date  . CAD (coronary artery disease)   . History of syncope   . HTN (hypertension)   . Borderline hyperlipidemia   . History of gastritis   . Diverticulosis of colon   . Colon polyps   . Hypertrophy of prostate with urinary obstruction and other lower urinary tract symptoms (LUTS)   . History of pyelonephritis   . DJD (degenerative joint disease)   . Anxiety   . DM (diabetes mellitus)     Adult onset  . 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 History of kidney stones    Past Surgical History  Procedure Laterality Date  . Appendectomy    . Cataract extraction      RIGHT EYE  . Knee surgery      bilateral ARTHROSCOPY X2 TO EACH KNEE  . Shoulder surgery  04/2005    left by Dr. Berenice Primas  . 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 S/p cysto & stents for kidnedy stone 11/08 by dr. Reece Agar    . Cystoscopy  04/17/11    stent placed  . Cystoscopy w/ ureteral stent placement  04/17/2011    Procedure: CYSTOSCOPY WITH RETROGRADE PYELOGRAM/URETERAL STENT PLACEMENT;  Surgeon: Hanley Ben, MD;  Location: [facility];  Service: Urology;  Laterality: Left;  . Cystoscopy with retrograde pyelogram, ureteroscopy and stent placement Left 04/18/2013    Procedure: CYSTOSCOPY WITH LEFT RETROGRADE PYELOGRAM, LEFT  URETEROSCOPY AND LASER LITHOTRIPSY LEFT STENT PLACEMENT;  Surgeon: Dutch Gray, MD;  Location: [facility];  Service: Urology;  Laterality: Left;  . Holmium laser application Left 04/15/1441    Procedure: HOLMIUM LASER APPLICATION;  Surgeon: Dutch Gray, MD;  Location: [facility];  Service: Urology;  Laterality: Left;   Family History  Problem Relation Age of Onset  . Heart attack Mother   . Heart attack Mother    History  Substance Use Topics  . Smoking status: Never Smoker   . Smokeless tobacco: Not on file  . Alcohol Use: No    Review of Systems  All other systems reviewed and are negative.      Allergies  Codeine phosphate and Morphine  Home Medications   Current Outpatient Rx  Name  Route  Sig  Dispense  Refill  . amLODipine (NORVASC) 10 MG tablet   Oral   Take 10 mg by mouth every morning.          Marland Kitchen aspirin EC 325 MG tablet   Oral   Take 325 mg by mouth at bedtime.          Marland Kitchen glimepiride (AMARYL) 4 MG tablet   Oral   Take 4 mg by mouth daily with breakfast.         . hydrALAZINE (APRESOLINE) 25 MG tablet   Oral  Take 25 mg by mouth 2 (two) times daily.         Marland Kitchen lisinopril-hydrochlorothiazide (PRINZIDE,ZESTORETIC) 20-25 MG per tablet   Oral   Take 1 tablet by mouth every morning.          . metFORMIN (GLUCOPHAGE) 500 MG tablet   Oral   Take 500 mg by mouth 2 (two) times daily with a meal.         . metoprolol (LOPRESSOR) 50 MG tablet   Oral   Take 50 mg by mouth 2 (two) times daily.         . naproxen sodium (ANAPROX) 220 MG tablet   Oral   Take 220 mg by mouth daily as needed (for pain).         . ondansetron (ZOFRAN-ODT) 8 MG disintegrating tablet   Oral   Take 1 tablet by mouth every 8 (eight) hours as needed for nausea.          Marland Kitchen oxyCODONE-acetaminophen (PERCOCET/ROXICET) 5-325 MG per tablet   Oral   Take 1 tablet by mouth every 6 (six) hours as needed for moderate pain.          . potassium chloride SA (K-DUR,KLOR-CON) 20 MEQ  tablet   Oral   Take 20 mEq by mouth daily.         . pravastatin (PRAVACHOL) 40 MG tablet   Oral   Take 40 mg by mouth at bedtime.         . tamsulosin (FLOMAX) 0.4 MG CAPS   Oral   Take 0.4 mg by mouth at bedtime.          . ciprofloxacin (CIPRO) 250 MG tablet   Oral   Take 1 tablet (250 mg total) by mouth 2 (two) times daily.   8 tablet   0    BP 159/82  Pulse 83  Temp(Src) 97.9 F (36.6 C) (Oral)  Resp 16  SpO2 91% Physical Exam  Nursing note and vitals reviewed. Constitutional: He is oriented to person, place, and time. He appears well-developed and well-nourished. No distress.  HENT:  Head: Normocephalic and atraumatic.  Nose: Nose normal.  Mouth/Throat: Oropharynx is clear and moist.  Eyes: Conjunctivae and EOM are normal. Pupils are equal, round, and reactive to light.  Neck: Normal range of motion. Neck supple. No JVD present. No tracheal deviation present. No thyromegaly present.  Cardiovascular: Normal rate, regular rhythm, normal heart sounds and intact distal pulses.  Exam reveals no gallop and no friction rub.   No murmur heard. Pulmonary/Chest: Effort normal and breath sounds normal. No stridor. No respiratory distress. He has no wheezes. He has no rales. He exhibits no tenderness.  Abdominal: Soft. Bowel sounds are normal. He exhibits no distension and no mass. There is no tenderness. There is no rebound and no guarding.  Genitourinary:  Foley has been placed, draining urine well  Musculoskeletal: Normal range of motion. He exhibits no edema and no tenderness.  Lymphadenopathy:    He has no cervical adenopathy.  Neurological: He is alert and oriented to person, place, and time. He exhibits normal muscle tone. Coordination normal.  Skin: Skin is warm and dry. No rash noted. No erythema. No pallor.  Psychiatric: He has a normal mood and affect. His behavior is normal. Judgment and thought content normal.    ED Course  Procedures (including critical  care time) Labs Review Labs Reviewed  URINALYSIS, ROUTINE W REFLEX MICROSCOPIC - Abnormal; Notable for the following:  APPearance CLOUDY (*)    Hgb urine dipstick LARGE (*)    Protein, ur 30 (*)    All other components within normal limits  URINE MICROSCOPIC-ADD ON   Imaging Review No results found.   EKG Interpretation None      MDM   Final diagnoses:  Urinary retention    79 yo male with urinary retention, much improved after foley placement.  Pt already on flomax.  He has urologist he will f/u with on Monday.    Kalman Drape, MD 04/24/13 [phone]

## 2013-04-23 NOTE — Discharge Instructions (Signed)
Acute Urinary Retention, Male °Acute urinary retention is the temporary inability to urinate. °This is a common problem in older men. As men age their prostates become larger and block the flow of urine from the bladder. This is usually a problem that has come on gradually.  °HOME CARE INSTRUCTIONS °If you are sent home with a Foley catheter and a drainage system, you will need to discuss the best course of action with your health care provider. While the catheter is in, maintain a good intake of fluids. Keep the drainage bag emptied and lower than your catheter. This is so that contaminated urine will not flow back into your bladder, which could lead to a urinary tract infection. °There are two main types of drainage bags. One is a large bag that usually is used at night. It has a good capacity that will allow you to sleep through the night without having to empty it. The second type is called a leg bag. It has a smaller capacity, so it needs to be emptied more frequently. However, the main advantage is that it can be attached by a leg strap and can go underneath your clothing, allowing you the freedom to move about or leave your home. °Only take over-the-counter or prescription medicines for pain, discomfort, or fever as directed by your health care provider.  °SEEK MEDICAL CARE IF: °· You develop a low-grade fever. °· You experience spasms or leakage of urine with the spasms. °SEEK IMMEDIATE MEDICAL CARE IF:  °· You develop chills or fever. °· Your catheter stops draining urine. °· Your catheter falls out. °· You start to develop increased bleeding that does not respond to rest and increased fluid intake. °MAKE SURE YOU: °· Understand these instructions. °· Will watch your condition. °· Will get help right away if you are not doing well or get worse. °Document Released: 05/05/2000 Document Revised: 09/29/2012 Document Reviewed: 07/08/2012 °Exit[employer]® Patient Information ©2014 [employer]. ° °

## 2013-04-23 NOTE — ED Notes (Signed)
Pt states had kidney stone removed on Monday, a Foley catheter was placed Tuesday because he was unable to urinate. Foley was removed yesterday. Pt states has only been able to "tinkle" since then but is retaining a lot of urine.

## 2013-04-25 ENCOUNTER — Telehealth: Payer: Self-pay | Admitting: Pulmonary Disease

## 2013-04-25 MED ORDER — TAMSULOSIN HCL 0.4 MG PO CAPS: 0.4000 mg | ORAL_CAPSULE | Freq: Every day | ORAL | Status: DC

## 2013-04-25 NOTE — Telephone Encounter (Signed)
Pt is inquiring about his Flomax prescription. He needs a refill on this. Rx has been sent in. Nothing further is needed.

## 2013-04-28 NOTE — Telephone Encounter (Signed)
Called and made pt aware SN is retiring from [facility] 05/11/13. We need to set up with new PCP. He was giving the # to [facility] per pt request. He is going to call to make appt. Nothing further needed

## 2013-05-02 ENCOUNTER — Telehealth: Payer: Self-pay | Admitting: Pulmonary Disease

## 2013-05-02 MED ORDER — TAMSULOSIN HCL 0.4 MG PO CAPS: ORAL_CAPSULE | ORAL | Status: DC

## 2013-05-02 MED ORDER — POTASSIUM CHLORIDE CRYS ER 20 MEQ PO TBCR: 20.0000 meq | EXTENDED_RELEASE_TABLET | Freq: Every day | ORAL | Status: DC

## 2013-05-02 MED ORDER — LISINOPRIL-HYDROCHLOROTHIAZIDE 20-25 MG PO TABS: 1.0000 | ORAL_TABLET | Freq: Every morning | ORAL | Status: DC

## 2013-05-02 MED ORDER — GLIMEPIRIDE 4 MG PO TABS: 4.0000 mg | ORAL_TABLET | Freq: Every day | ORAL | Status: DC

## 2013-05-02 NOTE — Telephone Encounter (Signed)
Refills have been sent to the pharmacy---[facility] per pts request.   i have called and lmom to make the pt aware. Nothing further is needed.

## 2013-05-11 ENCOUNTER — Ambulatory Visit (INDEPENDENT_AMBULATORY_CARE_PROVIDER_SITE_OTHER): Payer: Medicare Other | Admitting: Family Medicine

## 2013-05-11 ENCOUNTER — Encounter: Payer: Self-pay | Admitting: Family Medicine

## 2013-05-11 VITALS — BP 170/86 | HR 80 | Temp 98.0°F | Ht 68.75 in | Wt 226.2 lb

## 2013-05-11 DIAGNOSIS — I10 Essential (primary) hypertension: Secondary | ICD-10-CM

## 2013-05-11 DIAGNOSIS — N200 Calculus of kidney: Secondary | ICD-10-CM

## 2013-05-11 DIAGNOSIS — E1159 Type 2 diabetes mellitus with other circulatory complications: Secondary | ICD-10-CM

## 2013-05-11 DIAGNOSIS — E785 Hyperlipidemia, unspecified: Secondary | ICD-10-CM

## 2013-05-11 DIAGNOSIS — E119 Type 2 diabetes mellitus without complications: Secondary | ICD-10-CM

## 2013-05-11 DIAGNOSIS — I798 Other disorders of arteries, arterioles and capillaries in diseases classified elsewhere: Secondary | ICD-10-CM

## 2013-05-11 LAB — HEMOGLOBIN A1C: Hgb A1c MFr Bld: 7.6 % — ABNORMAL HIGH (ref 4.6–6.5)

## 2013-05-11 MED ORDER — TAMSULOSIN HCL 0.4 MG PO CAPS: 0.4000 mg | ORAL_CAPSULE | Freq: Every day | ORAL | Status: DC

## 2013-05-11 NOTE — Assessment & Plan Note (Signed)
Continue flomax with prn oxycodone.  He agrees.

## 2013-05-11 NOTE — Assessment & Plan Note (Signed)
Reasonable to continue current meds.  He agrees.  No ADE on meds.  Recheck A1c later at a CPE.

## 2013-05-11 NOTE — Assessment & Plan Note (Signed)
Recheck BP 160/80, reasonable control at home, continue current meds.  He agrees.

## 2013-05-11 NOTE — Patient Instructions (Signed)
Check with your insurance to see if they will cover the shingles shot. Go to the lab on the way out.  We'll contact you with your lab report. I sent your flomax to the pharmacy.  Schedule a physical for about 6 months from now.  Labs ahead of time.  Call back as needed in the meantime.

## 2013-05-11 NOTE — Progress Notes (Signed)
Pre visit review using our clinic review tool, if applicable. No additional management support is needed unless otherwise documented below in the visit note.  To transfer care with new MD.    Diabetes:  Using medications without difficulties:yes Hypoglycemic episodes:no Hyperglycemic episodes:no Feet problems:no Blood Sugars averaging: 80-130  Hypertension:    Using medication without problems or lightheadedness: yes Chest pain with exertion:no Edema:no Short of breath:no Average home BPs: usually ~150/70s at home.   Elevated Cholesterol: Using medications without problems:yes Muscle aches: no Diet compliance:d/w pt Exercise: encouraged  H/o L sided renal stones.  Used oxycodone prn w/o ADE.    PMH and SH reviewed.   Vital signs, Meds and allergies reviewed.  ROS: See HPI.  Otherwise nontributory.   GEN: nad, alert and oriented HEENT: mucous membranes moist NECK: supple w/o LA CV: rrr. PULM: ctab, no inc wob ABD: soft, +bs EXT: no edema SKIN: no acute rash  Diabetic foot exam: Normal inspection No skin breakdown No calluses  Normal DP pulses Normal sensation to light tough and monofilament Nails thickened

## 2013-05-11 NOTE — Assessment & Plan Note (Signed)
Reasonable to continue current meds.  He agrees.  No ADE on meds.

## 2013-05-12 ENCOUNTER — Encounter: Payer: Self-pay | Admitting: *Deleted

## 2013-05-16 ENCOUNTER — Other Ambulatory Visit: Payer: Self-pay | Admitting: *Deleted

## 2013-05-16 MED ORDER — HYDRALAZINE HCL 25 MG PO TABS: 25.0000 mg | ORAL_TABLET | Freq: Two times a day (BID) | ORAL | Status: DC

## 2013-05-23 ENCOUNTER — Other Ambulatory Visit: Payer: Self-pay | Admitting: *Deleted

## 2013-05-23 MED ORDER — METFORMIN HCL 1000 MG PO TABS: 1000.0000 mg | ORAL_TABLET | Freq: Two times a day (BID) | ORAL | Status: DC

## 2013-05-23 NOTE — Telephone Encounter (Signed)
Received faxed refill request from pharmacy. Refill sent to pharmacy electronically. 

## 2013-05-27 ENCOUNTER — Other Ambulatory Visit: Payer: Self-pay | Admitting: Pulmonary Disease

## 2013-05-27 MED ORDER — AMLODIPINE BESYLATE 10 MG PO TABS: 10.0000 mg | ORAL_TABLET | Freq: Every morning | ORAL | Status: DC

## 2013-06-03 ENCOUNTER — Telehealth: Payer: Self-pay

## 2013-06-03 NOTE — Telephone Encounter (Signed)
Relevant patient education mailed to patient.  

## 2013-06-23 ENCOUNTER — Telehealth: Payer: Self-pay | Admitting: *Deleted

## 2013-06-23 DIAGNOSIS — N529 Male erectile dysfunction, unspecified: Secondary | ICD-10-CM

## 2013-06-23 NOTE — Telephone Encounter (Signed)
Received fax saying pt wants new Rx of Cialis, Rx not on med list, please advise

## 2013-06-24 DIAGNOSIS — N529 Male erectile dysfunction, unspecified: Secondary | ICD-10-CM | POA: Insufficient documentation

## 2013-06-24 MED ORDER — TADALAFIL 20 MG PO TABS: 10.0000 mg | ORAL_TABLET | ORAL | Status: DC | PRN

## 2013-06-24 NOTE — Telephone Encounter (Signed)
Had been on viagra prev. Okay to try. Sent.

## 2013-07-20 ENCOUNTER — Other Ambulatory Visit: Payer: Self-pay | Admitting: Pulmonary Disease

## 2013-07-20 ENCOUNTER — Encounter: Payer: Self-pay | Admitting: Internal Medicine

## 2013-07-20 ENCOUNTER — Other Ambulatory Visit: Payer: Self-pay | Admitting: *Deleted

## 2013-07-20 MED ORDER — ZOSTER VACCINE LIVE 19400 UNT/0.65ML ~~LOC~~ SOLR: 0.6500 mL | Freq: Once | SUBCUTANEOUS | Status: DC

## 2013-07-20 MED ORDER — PRAVASTATIN SODIUM 40 MG PO TABS: 40.0000 mg | ORAL_TABLET | Freq: Every day | ORAL | Status: DC

## 2013-07-20 NOTE — Telephone Encounter (Signed)
Sent both. He has CPE scheduled.  Thanks.

## 2013-07-20 NOTE — Telephone Encounter (Signed)
Last office visit 05/11/2013.  Last Lipid 03/22/2012.  Ok to refill? [name] is also request Rx for Zostavax sent to [facility].

## 2013-08-11 ENCOUNTER — Other Ambulatory Visit: Payer: Self-pay | Admitting: Pulmonary Disease

## 2013-08-15 ENCOUNTER — Other Ambulatory Visit: Payer: Self-pay | Admitting: *Deleted

## 2013-08-15 MED ORDER — GLIMEPIRIDE 4 MG PO TABS: 4.0000 mg | ORAL_TABLET | Freq: Every day | ORAL | Status: DC

## 2013-08-15 NOTE — Telephone Encounter (Signed)
Received faxed refill request from pharmacy. Refill sent to pharmacy electronically. 

## 2013-08-26 ENCOUNTER — Ambulatory Visit: Payer: Medicare Other | Admitting: Pulmonary Disease

## 2013-10-20 ENCOUNTER — Other Ambulatory Visit: Payer: Self-pay | Admitting: Pulmonary Disease

## 2013-10-24 ENCOUNTER — Other Ambulatory Visit: Payer: Self-pay | Admitting: *Deleted

## 2013-10-24 MED ORDER — LISINOPRIL-HYDROCHLOROTHIAZIDE 20-25 MG PO TABS: ORAL_TABLET | ORAL | Status: DC

## 2013-10-27 ENCOUNTER — Other Ambulatory Visit: Payer: Self-pay | Admitting: Pulmonary Disease

## 2013-10-31 ENCOUNTER — Other Ambulatory Visit: Payer: Self-pay | Admitting: *Deleted

## 2013-10-31 MED ORDER — POTASSIUM CHLORIDE CRYS ER 20 MEQ PO TBCR: EXTENDED_RELEASE_TABLET | ORAL | Status: DC

## 2013-10-31 NOTE — Telephone Encounter (Signed)
Received faxed refill request from pharmacy. Refill sent.

## 2013-11-03 ENCOUNTER — Other Ambulatory Visit: Payer: Self-pay | Admitting: Family Medicine

## 2013-11-10 ENCOUNTER — Ambulatory Visit (INDEPENDENT_AMBULATORY_CARE_PROVIDER_SITE_OTHER): Payer: Medicare Other | Admitting: Family Medicine

## 2013-11-10 ENCOUNTER — Encounter: Payer: Self-pay | Admitting: Family Medicine

## 2013-11-10 VITALS — BP 170/80 | HR 66 | Temp 97.8°F | Ht 68.75 in | Wt 231.0 lb

## 2013-11-10 DIAGNOSIS — Z1211 Encounter for screening for malignant neoplasm of colon: Secondary | ICD-10-CM

## 2013-11-10 DIAGNOSIS — E1151 Type 2 diabetes mellitus with diabetic peripheral angiopathy without gangrene: Secondary | ICD-10-CM

## 2013-11-10 DIAGNOSIS — Z0000 Encounter for general adult medical examination without abnormal findings: Secondary | ICD-10-CM

## 2013-11-10 DIAGNOSIS — E119 Type 2 diabetes mellitus without complications: Secondary | ICD-10-CM

## 2013-11-10 DIAGNOSIS — Z7189 Other specified counseling: Secondary | ICD-10-CM | POA: Insufficient documentation

## 2013-11-10 DIAGNOSIS — I10 Essential (primary) hypertension: Secondary | ICD-10-CM

## 2013-11-10 DIAGNOSIS — Z23 Encounter for immunization: Secondary | ICD-10-CM

## 2013-11-10 DIAGNOSIS — E785 Hyperlipidemia, unspecified: Secondary | ICD-10-CM

## 2013-11-10 DIAGNOSIS — E1159 Type 2 diabetes mellitus with other circulatory complications: Secondary | ICD-10-CM

## 2013-11-10 LAB — COMPREHENSIVE METABOLIC PANEL
ALK PHOS: 51 U/L (ref 39–117)
ALT: 35 U/L (ref 0–53)
AST: 25 U/L (ref 0–37)
Albumin: 4.2 g/dL (ref 3.5–5.2)
BILIRUBIN TOTAL: 0.5 mg/dL (ref 0.2–1.2)
BUN: 20 mg/dL (ref 6–23)
CO2: 28 mEq/L (ref 19–32)
CREATININE: 1.2 mg/dL (ref 0.4–1.5)
Calcium: 9.8 mg/dL (ref 8.4–10.5)
Chloride: 102 mEq/L (ref 96–112)
GFR: 61.24 mL/min (ref >60.00)
GLUCOSE: 168 mg/dL — AB (ref 70–99)
Potassium: 3.8 mEq/L (ref 3.5–5.1)
Sodium: 138 mEq/L (ref 135–145)
Total Protein: 7.2 g/dL (ref 6.0–8.3)

## 2013-11-10 LAB — LIPID PANEL
CHOL/HDL RATIO: 4
CHOLESTEROL: 126 mg/dL (ref 0–200)
HDL: 32.1 mg/dL — AB (ref >39.00)
LDL CALC: 54 mg/dL (ref 0–99)
NonHDL: 93.9
TRIGLYCERIDES: 199 mg/dL — AB (ref 0.0–149.0)
VLDL: 39.8 mg/dL (ref 0.0–40.0)

## 2013-11-10 LAB — HEMOGLOBIN A1C: Hgb A1c MFr Bld: 7.5 % — ABNORMAL HIGH (ref 4.6–6.5)

## 2013-11-10 MED ORDER — METOPROLOL TARTRATE 50 MG PO TABS: 50.0000 mg | ORAL_TABLET | Freq: Two times a day (BID) | ORAL | Status: DC

## 2013-11-10 MED ORDER — AMLODIPINE BESYLATE 10 MG PO TABS: 10.0000 mg | ORAL_TABLET | Freq: Every morning | ORAL | Status: DC

## 2013-11-10 MED ORDER — TAMSULOSIN HCL 0.4 MG PO CAPS: 0.4000 mg | ORAL_CAPSULE | Freq: Every day | ORAL | Status: DC

## 2013-11-10 MED ORDER — POTASSIUM CHLORIDE CRYS ER 20 MEQ PO TBCR: EXTENDED_RELEASE_TABLET | ORAL | Status: DC

## 2013-11-10 MED ORDER — METFORMIN HCL 1000 MG PO TABS: 1000.0000 mg | ORAL_TABLET | Freq: Two times a day (BID) | ORAL | Status: DC

## 2013-11-10 MED ORDER — PRAVASTATIN SODIUM 40 MG PO TABS: 40.0000 mg | ORAL_TABLET | Freq: Every day | ORAL | Status: DC

## 2013-11-10 MED ORDER — HYDRALAZINE HCL 25 MG PO TABS: ORAL_TABLET | ORAL | Status: DC

## 2013-11-10 MED ORDER — GLIMEPIRIDE 4 MG PO TABS: 4.0000 mg | ORAL_TABLET | Freq: Every day | ORAL | Status: DC

## 2013-11-10 MED ORDER — LISINOPRIL-HYDROCHLOROTHIAZIDE 20-25 MG PO TABS: ORAL_TABLET | ORAL | Status: DC

## 2013-11-10 NOTE — Progress Notes (Signed)
Pre visit review using our clinic review tool, if applicable. No additional management support is needed unless otherwise documented below in the visit note.  CPE- See plan.  Routine anticipatory guidance given to patient.  See health maintenance. Shingles shot 2015 Flu shot to be done at pharmacy.   Tetanus 2013 PNA 2006. Updated today with prevnar.  Colonoscopy due per old records, d/w pt.   PSA per uro.  Living will d/w pt.  Both daughters equally designated if he were incapacitated.   Diet and exercise d/w pt.  Active, still doing yardwork.  Living alone, no falls.  He is still independent.    Diabetes:  Using medications without difficulties: yes Hypoglycemic episodes: no Hyperglycemic episodes:no Feet problems:no Blood Sugars averaging: 100-120 eye exam within last year: per patient report 01/2013 ([place])  Hypertension:    Using medication without problems or lightheadedness: yes Chest pain with exertion:no Edema:no Short of breath:no Average home BPs: BP lower later in the day, ~140/70s.  D/w pt.    Elevated Cholesterol: Using medications without problems: yes Muscle aches: no Diet compliance: yes Exercise:yes  PMH and SH reviewed.   Vital signs, Meds and allergies reviewed.  ROS: See HPI.  Otherwise nontributory.   GEN: nad, alert and oriented HEENT: mucous membranes moist NECK: supple w/o LA CV: rrr.  no murmur PULM: ctab, no inc wob ABD: soft, +bs EXT: no edema SKIN: no acute rash  Diabetic foot exam: Normal inspection No skin breakdown No calluses  Normal DP pulses Normal sensation to light tough and monofilament Nails thickened.

## 2013-11-10 NOTE — Patient Instructions (Addendum)
Rosaria Ferries will call about your referral. Go to the lab on the way out.  We'll contact you with your lab report. Recheck in about 6 months with labs at the visit.   Glad to see you.  Take care.

## 2013-11-10 NOTE — Assessment & Plan Note (Signed)
Routine anticipatory guidance given to patient. See health maintenance.  Shingles shot 2015  Flu shot to be done at pharmacy.  Tetanus 2013  PNA 2006. Updated today with prevnar.  Colonoscopy due per old records, d/w pt.  PSA per uro.  Living will d/w pt. Both daughters equally designated if he were incapacitated.  Diet and exercise d/w pt. Active, still doing yardwork.  Living alone, no falls. He is still independent.

## 2013-11-10 NOTE — Addendum Note (Signed)
Addended by: Monico Blitz T on: 11/10/2013 04:33 PM   Modules accepted: Orders

## 2013-11-10 NOTE — Assessment & Plan Note (Signed)
BP normalizes later in the day on mult checks per patient.  Continue as is.  See notes on labs.

## 2013-11-10 NOTE — Assessment & Plan Note (Signed)
Continue D&E, no change in meds.  See notes on labs.

## 2013-11-10 NOTE — Assessment & Plan Note (Signed)
Continue current meds, D&E.  See notes on labs.  Recheck in about 6 months.  He agrees.  Up to date on eye exam.

## 2013-11-11 ENCOUNTER — Encounter: Payer: Self-pay | Admitting: Internal Medicine

## 2013-11-11 ENCOUNTER — Telehealth: Payer: Self-pay | Admitting: Family Medicine

## 2013-11-11 NOTE — Telephone Encounter (Signed)
emmi mailed  °

## 2013-12-15 ENCOUNTER — Encounter (HOSPITAL_COMMUNITY): Payer: Self-pay | Admitting: *Deleted

## 2013-12-15 ENCOUNTER — Observation Stay (HOSPITAL_COMMUNITY): Payer: Medicare Other

## 2013-12-15 ENCOUNTER — Emergency Department (HOSPITAL_COMMUNITY): Payer: Medicare Other

## 2013-12-15 ENCOUNTER — Observation Stay (HOSPITAL_COMMUNITY)
Admission: EM | Admit: 2013-12-15 | Discharge: 2013-12-17 | Disposition: A | Discharge status: Home / Self Care | Payer: Medicare Other | Attending: Internal Medicine | Admitting: Internal Medicine

## 2013-12-15 DIAGNOSIS — Z86010 Personal history of colonic polyps: Secondary | ICD-10-CM | POA: Diagnosis not present

## 2013-12-15 DIAGNOSIS — E785 Hyperlipidemia, unspecified: Secondary | ICD-10-CM | POA: Insufficient documentation

## 2013-12-15 DIAGNOSIS — R350 Frequency of micturition: Secondary | ICD-10-CM | POA: Diagnosis not present

## 2013-12-15 DIAGNOSIS — F419 Anxiety disorder, unspecified: Secondary | ICD-10-CM | POA: Diagnosis not present

## 2013-12-15 DIAGNOSIS — Z87442 Personal history of urinary calculi: Secondary | ICD-10-CM | POA: Insufficient documentation

## 2013-12-15 DIAGNOSIS — I2510 Atherosclerotic heart disease of native coronary artery without angina pectoris: Secondary | ICD-10-CM | POA: Diagnosis not present

## 2013-12-15 DIAGNOSIS — I10 Essential (primary) hypertension: Secondary | ICD-10-CM | POA: Diagnosis not present

## 2013-12-15 DIAGNOSIS — Z79899 Other long term (current) drug therapy: Secondary | ICD-10-CM | POA: Insufficient documentation

## 2013-12-15 DIAGNOSIS — R079 Chest pain, unspecified: Secondary | ICD-10-CM | POA: Diagnosis present

## 2013-12-15 DIAGNOSIS — R42 Dizziness and giddiness: Secondary | ICD-10-CM | POA: Diagnosis present

## 2013-12-15 DIAGNOSIS — E119 Type 2 diabetes mellitus without complications: Secondary | ICD-10-CM | POA: Diagnosis not present

## 2013-12-15 DIAGNOSIS — Z7982 Long term (current) use of aspirin: Secondary | ICD-10-CM | POA: Diagnosis not present

## 2013-12-15 DIAGNOSIS — M1990 Unspecified osteoarthritis, unspecified site: Secondary | ICD-10-CM | POA: Diagnosis not present

## 2013-12-15 DIAGNOSIS — N401 Enlarged prostate with lower urinary tract symptoms: Secondary | ICD-10-CM | POA: Insufficient documentation

## 2013-12-15 DIAGNOSIS — Z8679 Personal history of other diseases of the circulatory system: Secondary | ICD-10-CM | POA: Insufficient documentation

## 2013-12-15 DIAGNOSIS — R270 Ataxia, unspecified: Secondary | ICD-10-CM | POA: Diagnosis not present

## 2013-12-15 DIAGNOSIS — E876 Hypokalemia: Secondary | ICD-10-CM | POA: Insufficient documentation

## 2013-12-15 DIAGNOSIS — R262 Difficulty in walking, not elsewhere classified: Secondary | ICD-10-CM

## 2013-12-15 DIAGNOSIS — K5730 Diverticulosis of large intestine without perforation or abscess without bleeding: Secondary | ICD-10-CM | POA: Diagnosis not present

## 2013-12-15 LAB — CBG MONITORING, ED: GLUCOSE-CAPILLARY: 227 mg/dL — AB (ref 70–99)

## 2013-12-15 LAB — COMPREHENSIVE METABOLIC PANEL
ALT: 31 U/L (ref 0–53)
ANION GAP: 16 — AB (ref 5–15)
AST: 18 U/L (ref 0–37)
Albumin: 4 g/dL (ref 3.5–5.2)
Alkaline Phosphatase: 67 U/L (ref 39–117)
BILIRUBIN TOTAL: 0.5 mg/dL (ref 0.3–1.2)
BUN: 18 mg/dL (ref 6–23)
CO2: 26 mEq/L (ref 19–32)
Calcium: 9.7 mg/dL (ref 8.4–10.5)
Chloride: 98 mEq/L (ref 96–112)
Creatinine, Ser: 0.93 mg/dL (ref 0.50–1.35)
GFR calc Af Amer: >90 mL/min (ref >90)
GFR calc non Af Amer: 80 mL/min — ABNORMAL LOW (ref >90)
GLUCOSE: 240 mg/dL — AB (ref 70–99)
POTASSIUM: 4 meq/L (ref 3.7–5.3)
SODIUM: 140 meq/L (ref 137–147)
Total Protein: 6.9 g/dL (ref 6.0–8.3)

## 2013-12-15 LAB — CBC WITH DIFFERENTIAL/PLATELET
Basophils Absolute: 0 10*3/uL (ref 0.0–0.1)
Basophils Relative: 1 % (ref 0–1)
Eosinophils Absolute: 0.1 10*3/uL (ref 0.0–0.7)
Eosinophils Relative: 2 % (ref 0–5)
HCT: 44.5 % (ref 39.0–52.0)
HEMOGLOBIN: 15.7 g/dL (ref 13.0–17.0)
LYMPHS ABS: 1.2 10*3/uL (ref 0.7–4.0)
Lymphocytes Relative: 17 % (ref 12–46)
MCH: 32.6 pg (ref 26.0–34.0)
MCHC: 35.3 g/dL (ref 30.0–36.0)
MCV: 92.5 fL (ref 78.0–100.0)
MONOS PCT: 9 % (ref 3–12)
Monocytes Absolute: 0.6 10*3/uL (ref 0.1–1.0)
NEUTROS ABS: 5 10*3/uL (ref 1.7–7.7)
NEUTROS PCT: 71 % (ref 43–77)
Platelets: 272 10*3/uL (ref 150–400)
RBC: 4.81 MIL/uL (ref 4.22–5.81)
RDW: 13.4 % (ref 11.5–15.5)
WBC: 7 10*3/uL (ref 4.0–10.5)

## 2013-12-15 LAB — I-STAT CHEM 8, ED
BUN: 18 mg/dL (ref 6–23)
CALCIUM ION: 1.16 mmol/L (ref 1.13–1.30)
CHLORIDE: 96 meq/L (ref 96–112)
CREATININE: 1 mg/dL (ref 0.50–1.35)
Glucose, Bld: 240 mg/dL — ABNORMAL HIGH (ref 70–99)
HEMATOCRIT: 48 % (ref 39.0–52.0)
Hemoglobin: 16.3 g/dL (ref 13.0–17.0)
Potassium: 3.6 mEq/L — ABNORMAL LOW (ref 3.7–5.3)
Sodium: 137 mEq/L (ref 137–147)
TCO2: 22 mmol/L (ref 0–100)

## 2013-12-15 LAB — URINALYSIS, ROUTINE W REFLEX MICROSCOPIC
Bilirubin Urine: NEGATIVE
GLUCOSE, UA: 250 mg/dL — AB
HGB URINE DIPSTICK: NEGATIVE
Ketones, ur: NEGATIVE mg/dL
LEUKOCYTES UA: NEGATIVE
Nitrite: NEGATIVE
PH: 7 (ref 5.0–8.0)
PROTEIN: NEGATIVE mg/dL
SPECIFIC GRAVITY, URINE: 1.012 (ref 1.005–1.030)
Urobilinogen, UA: 0.2 mg/dL (ref 0.0–1.0)

## 2013-12-15 LAB — I-STAT TROPONIN, ED
TROPONIN I, POC: 0.01 ng/mL (ref 0.00–0.08)
Troponin i, poc: 0.01 ng/mL (ref 0.00–0.08)

## 2013-12-15 LAB — POC OCCULT BLOOD, ED: Fecal Occult Bld: NEGATIVE

## 2013-12-15 LAB — GLUCOSE, CAPILLARY: Glucose-Capillary: 165 mg/dL — ABNORMAL HIGH (ref 70–99)

## 2013-12-15 MED ORDER — TAMSULOSIN HCL 0.4 MG PO CAPS
0.4000 mg | ORAL_CAPSULE | Freq: Every day | ORAL | Status: DC
  Administered 2013-12-15 – 2013-12-16 (×2): 0.4 mg via ORAL
  Filled 2013-12-15 (×2): qty 1

## 2013-12-15 MED ORDER — METOPROLOL TARTRATE 50 MG PO TABS
50.0000 mg | ORAL_TABLET | Freq: Two times a day (BID) | ORAL | Status: DC
  Administered 2013-12-15 – 2013-12-17 (×4): 50 mg via ORAL
  Filled 2013-12-15 (×4): qty 1

## 2013-12-15 MED ORDER — POTASSIUM CHLORIDE CRYS ER 10 MEQ PO TBCR
10.0000 meq | EXTENDED_RELEASE_TABLET | Freq: Every day | ORAL | Status: DC
  Administered 2013-12-15 – 2013-12-17 (×3): 10 meq via ORAL
  Filled 2013-12-15 (×3): qty 1

## 2013-12-15 MED ORDER — LISINOPRIL-HYDROCHLOROTHIAZIDE 20-25 MG PO TABS: 1.0000 | ORAL_TABLET | Freq: Every day | ORAL | Status: DC

## 2013-12-15 MED ORDER — SODIUM CHLORIDE 0.9 % IV BOLUS (SEPSIS)
500.0000 mL | Freq: Once | INTRAVENOUS | Status: AC
  Administered 2013-12-15: 500 mL via INTRAVENOUS

## 2013-12-15 MED ORDER — ACETAMINOPHEN 650 MG RE SUPP: 650.0000 mg | Freq: Four times a day (QID) | RECTAL | Status: DC | PRN

## 2013-12-15 MED ORDER — LISINOPRIL 20 MG PO TABS
20.0000 mg | ORAL_TABLET | Freq: Every day | ORAL | Status: DC
  Administered 2013-12-16 – 2013-12-17 (×2): 20 mg via ORAL
  Filled 2013-12-15 (×2): qty 1

## 2013-12-15 MED ORDER — SODIUM CHLORIDE 0.9 % IV BOLUS (SEPSIS)
1000.0000 mL | Freq: Once | INTRAVENOUS | Status: AC
  Administered 2013-12-15: 1000 mL via INTRAVENOUS

## 2013-12-15 MED ORDER — AMLODIPINE BESYLATE 10 MG PO TABS
10.0000 mg | ORAL_TABLET | Freq: Every morning | ORAL | Status: DC
  Administered 2013-12-16 – 2013-12-17 (×2): 10 mg via ORAL
  Filled 2013-12-15 (×2): qty 1

## 2013-12-15 MED ORDER — ONDANSETRON HCL 4 MG/2ML IJ SOLN
4.0000 mg | Freq: Once | INTRAMUSCULAR | Status: AC
Start: 2013-12-15 — End: 2013-12-15
  Administered 2013-12-15: 4 mg via INTRAVENOUS
  Filled 2013-12-15: qty 2

## 2013-12-15 MED ORDER — ENOXAPARIN SODIUM 40 MG/0.4ML ~~LOC~~ SOLN
40.0000 mg | SUBCUTANEOUS | Status: DC
  Administered 2013-12-15 – 2013-12-16 (×2): 40 mg via SUBCUTANEOUS
  Filled 2013-12-15 (×2): qty 0.4

## 2013-12-15 MED ORDER — ACETAMINOPHEN 325 MG PO TABS: 650.0000 mg | ORAL_TABLET | Freq: Four times a day (QID) | ORAL | Status: DC | PRN

## 2013-12-15 MED ORDER — HYDRALAZINE HCL 20 MG/ML IJ SOLN: 10.0000 mg | Freq: Four times a day (QID) | INTRAMUSCULAR | Status: DC | PRN

## 2013-12-15 MED ORDER — ONDANSETRON HCL 4 MG/2ML IJ SOLN
4.0000 mg | Freq: Once | INTRAMUSCULAR | Status: AC
  Administered 2013-12-15: 4 mg via INTRAVENOUS
  Filled 2013-12-15: qty 2

## 2013-12-15 MED ORDER — MECLIZINE HCL 25 MG PO TABS
50.0000 mg | ORAL_TABLET | Freq: Once | ORAL | Status: AC
  Administered 2013-12-15: 50 mg via ORAL
  Filled 2013-12-15: qty 2

## 2013-12-15 MED ORDER — HYDROCHLOROTHIAZIDE 25 MG PO TABS
25.0000 mg | ORAL_TABLET | Freq: Every day | ORAL | Status: DC
  Administered 2013-12-16 – 2013-12-17 (×2): 25 mg via ORAL
  Filled 2013-12-15 (×2): qty 1

## 2013-12-15 MED ORDER — ASPIRIN 325 MG PO TABS
325.0000 mg | ORAL_TABLET | Freq: Every day | ORAL | Status: DC
  Administered 2013-12-15 – 2013-12-17 (×3): 325 mg via ORAL
  Filled 2013-12-15 (×3): qty 1

## 2013-12-15 MED ORDER — HYDRALAZINE HCL 25 MG PO TABS
25.0000 mg | ORAL_TABLET | Freq: Three times a day (TID) | ORAL | Status: DC
  Administered 2013-12-15 – 2013-12-17 (×4): 25 mg via ORAL
  Filled 2013-12-15 (×4): qty 1

## 2013-12-15 MED ORDER — ONDANSETRON HCL 4 MG/2ML IJ SOLN: 4.0000 mg | Freq: Four times a day (QID) | INTRAMUSCULAR | Status: DC | PRN

## 2013-12-15 MED ORDER — ONDANSETRON HCL 4 MG PO TABS: 4.0000 mg | ORAL_TABLET | Freq: Four times a day (QID) | ORAL | Status: DC | PRN

## 2013-12-15 MED ORDER — PRAVASTATIN SODIUM 20 MG PO TABS
40.0000 mg | ORAL_TABLET | Freq: Every day | ORAL | Status: DC
  Administered 2013-12-15 – 2013-12-16 (×2): 40 mg via ORAL
  Filled 2013-12-15 (×3): qty 2

## 2013-12-15 MED ORDER — INSULIN ASPART 100 UNIT/ML ~~LOC~~ SOLN
0.0000 [IU] | Freq: Three times a day (TID) | SUBCUTANEOUS | Status: DC
  Administered 2013-12-16: 5 [IU] via SUBCUTANEOUS
  Administered 2013-12-16: 2 [IU] via SUBCUTANEOUS
  Administered 2013-12-16: 3 [IU] via SUBCUTANEOUS
  Administered 2013-12-17: 2 [IU] via SUBCUTANEOUS

## 2013-12-15 MED ORDER — SODIUM CHLORIDE 0.9 % IJ SOLN
3.0000 mL | Freq: Two times a day (BID) | INTRAMUSCULAR | Status: DC
  Administered 2013-12-15 – 2013-12-16 (×2): 3 mL via INTRAVENOUS

## 2013-12-15 NOTE — ED Notes (Signed)
Lab in pt room to draw blood.

## 2013-12-15 NOTE — ED Provider Notes (Signed)
Acing complains of lightheadedness upon standing noted 5 AM today. He felt well when he went to bed last night. He denies pain anywhere. Denies shortness of breath denies nausea or vomiting. No blood per rectum no black stools no other associated symptoms On exam patient is alert Glasgow Coma Score 15 HEENT exam he is moist pink nonicteric Treatment midline lungs clear auscultation heart regular rate and rhythm abdomen nondistended nontender extremities without edema  Orlie Dakin, MD 12/15/13 1647

## 2013-12-15 NOTE — ED Notes (Signed)
Pt ambulated from the stretcher to the nurse's station without any difficulty or distress; pt stated while ambulating "I just feel dizzy"; family at bedside

## 2013-12-15 NOTE — ED Notes (Addendum)
Spoke with Dr. Daleen Bo- verbal order for pt to travel off telemetry to MRI then to 3W. 3W RN made aware as well.

## 2013-12-15 NOTE — ED Notes (Signed)
MD at bedside. 

## 2013-12-15 NOTE — ED Notes (Addendum)
Sister and daughter at pt bedside. Dtr states other family members have problems with dizziness and vertigo.

## 2013-12-15 NOTE — ED Notes (Signed)
Pt resting watching television. Complaining of return of nausea and being hungry.

## 2013-12-15 NOTE — ED Notes (Signed)
Resident at bedside at this time.

## 2013-12-15 NOTE — ED Notes (Signed)
While pt lying flat reports no dizziness/light headedness, upon sitting him up pt complains of dizziness.

## 2013-12-15 NOTE — ED Notes (Signed)
Resident at bedside. Attempted to walk pt. Pt did not tolerate walking well. Pt gait unsteady and swaying. Pt placed back in bed. Antivert given. Stroke swallow screen passed.

## 2013-12-15 NOTE — ED Notes (Signed)
Pt in from home via [facility], per report pt stood up to go to the bathroom during the night & c/o severe dizziness, denies blurred vision, gait changes, & slurred speech, pt A&O x4, follows commands, speaks in complete sentences, denies pain

## 2013-12-15 NOTE — ED Provider Notes (Signed)
CSN: [id number]     Arrival date & time 12/15/13  0800 History   First MD Initiated Contact with Patient 12/15/13 [phone]     Chief Complaint  Patient presents with  . Dizziness  . Hypertension    Patient is a 79 y.o. male presenting with dizziness. The history is provided by the patient and the spouse.  Dizziness Quality:  Lightheadedness ("like I was going to pass out") Severity:  Severe Onset quality:  Sudden Duration:  3 hours (when he woke up this morning, noted symptoms) Timing:  Constant Progression:  Unchanged Chronicity:  New Context: standing up   Context: not with loss of consciousness   Relieved by:  None tried Worsened by:  Nothing tried Ineffective treatments:  None tried Associated symptoms: no chest pain, no diarrhea, no nausea, no shortness of breath, no vision changes and no vomiting     Past Medical History  Diagnosis Date  . CAD (coronary artery disease)   . History of syncope   . HTN (hypertension)   . Borderline hyperlipidemia   . History of gastritis   . Diverticulosis of colon   . Colon polyps   . Hypertrophy of prostate with urinary obstruction and other lower urinary tract symptoms (LUTS)   . History of pyelonephritis   . DJD (degenerative joint disease)   . Anxiety   . DM (diabetes mellitus)     Adult onset  . 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 History of kidney stones    Past Surgical History  Procedure Laterality Date  . Appendectomy    . Cataract extraction      RIGHT EYE  . Knee surgery      bilateral ARTHROSCOPY X2 TO EACH KNEE  . Shoulder surgery  04/2005    left by Dr. Berenice Primas  . 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 S/p cysto & stents for kidnedy stone 11/08 by dr. Reece Agar    . Cystoscopy  04/17/11    stent placed  . Cystoscopy w/ ureteral stent placement  04/17/2011    Procedure: CYSTOSCOPY WITH  RETROGRADE PYELOGRAM/URETERAL STENT PLACEMENT;  Surgeon: Hanley Ben, MD;  Location: [facility];  Service: Urology;  Laterality: Left;  . Cystoscopy with retrograde pyelogram, ureteroscopy and stent placement Left 04/18/2013    Procedure: CYSTOSCOPY WITH LEFT RETROGRADE PYELOGRAM, LEFT URETEROSCOPY AND LASER LITHOTRIPSY LEFT STENT PLACEMENT;  Surgeon: Dutch Gray, MD;  Location: [facility];  Service: Urology;  Laterality: Left;  . Holmium laser application Left 05/16/6597    Procedure: HOLMIUM LASER APPLICATION;  Surgeon: Dutch Gray, MD;  Location: [facility];  Service: Urology;  Laterality: Left;   Family History  Problem Relation Age of Onset  . Heart attack Mother   . Dementia Brother   . Colon cancer Neg Hx   . Prostate cancer Neg Hx    History  Substance Use Topics  . Smoking status: Never Smoker   . Smokeless tobacco: Never Used  . Alcohol Use: No    Review of Systems  Constitutional: Negative for fever and chills.  Respiratory: Negative for shortness of breath.   Cardiovascular: Negative for chest pain.  Gastrointestinal: Negative for nausea, vomiting and diarrhea.  Genitourinary: Positive for frequency. Negative for dysuria and decreased urine volume.  Musculoskeletal: Negative for gait problem.  Skin: Negative for rash.  Neurological:  Positive for light-headedness. Negative for dizziness, speech difficulty, weakness and numbness.  All other systems reviewed and are negative.   Allergies  Codeine phosphate and Morphine  Home Medications   Prior to Admission medications   Medication Sig Start Date End Date Taking? Authorizing Provider  amLODipine (NORVASC) 10 MG tablet Take 1 tablet (10 mg total) by mouth every morning. 11/10/13   Tonia Ghent, MD  aspirin EC 325 MG tablet Take 325 mg by mouth at bedtime.     Historical Provider, MD  glimepiride (AMARYL) 4 MG tablet Take 1 tablet (4 mg total) by mouth daily with breakfast. 11/10/13   Tonia Ghent, MD  hydrALAZINE (APRESOLINE)  25 MG tablet TAKE 1 TABLET BY MOUTH 2 TIMES A DAY. 11/10/13   Tonia Ghent, MD  lisinopril-hydrochlorothiazide (PRINZIDE,ZESTORETIC) 20-25 MG per tablet TAKE 1 TABLET BY MOUTH EVERY MORNING. 11/10/13   Tonia Ghent, MD  metFORMIN (GLUCOPHAGE) 1000 MG tablet Take 1 tablet (1,000 mg total) by mouth 2 (two) times daily with a meal. 11/10/13   Tonia Ghent, MD  metoprolol (LOPRESSOR) 50 MG tablet Take 1 tablet (50 mg total) by mouth 2 (two) times daily. 11/10/13   Tonia Ghent, MD  potassium chloride SA (KLOR-CON M20) 20 MEQ tablet Take one by mouth daily. 11/10/13   Tonia Ghent, MD  pravastatin (PRAVACHOL) 40 MG tablet Take 1 tablet (40 mg total) by mouth at bedtime. 11/10/13   Tonia Ghent, MD  tamsulosin (FLOMAX) 0.4 MG CAPS capsule Take 1 capsule (0.4 mg total) by mouth at bedtime. 11/10/13   Tonia Ghent, MD   BP 178/80 mmHg  Pulse 80  Temp(Src) 98.3 F (36.8 C) (Oral)  Resp 19  SpO2 92%   Physical Exam  Constitutional: He is oriented to person, place, and time. He appears well-developed and well-nourished. No distress.  HENT:  Head: Normocephalic and atraumatic.  Right Ear: External ear normal.  Left Ear: External ear normal.  Mouth/Throat: Oropharynx is clear and moist.  Eyes: EOM are normal. Pupils are equal, round, and reactive to light.  Neck: Normal range of motion.  Cardiovascular: Normal rate and regular rhythm.   Pulmonary/Chest: Effort normal and breath sounds normal. No respiratory distress. He has no wheezes. He has no rales.  Abdominal: Soft. He exhibits no distension. There is no tenderness. There is no rebound and no guarding.  Neurological: He is alert and oriented to person, place, and time.  No facial droop, normal strength with raising eyebrows, squeezing eyes shut and clenching jaw shut, no tongue deviation, speech is clear and easily understood, normal strength with shoulder shrug; sensation to light touch intact in V1-V3 and in all extremities; normal  strength in all major muscle groups of upper and lower extermities; normal finger to nose bilaterally, normal heel to shin bilaterally  Skin: Skin is warm and dry. No rash noted. He is not diaphoretic.  Psychiatric: He has a normal mood and affect.  Vitals reviewed.   ED Course  Procedures  Labs Review  Results for orders placed or performed during the hospital encounter of 12/15/13  CBC with Differential  Result Value Ref Range   WBC 7.0 4.0 - 10.5 K/uL   RBC 4.81 4.22 - 5.81 MIL/uL   Hemoglobin 15.7 13.0 - 17.0 g/dL   HCT 44.5 39.0 - 52.0 %   MCV 92.5 78.0 - 100.0 fL   MCH 32.6 26.0 - 34.0 pg   MCHC 35.3 30.0 - 36.0 g/dL  RDW 13.4 11.5 - 15.5 %   Platelets 272 150 - 400 K/uL   Neutrophils Relative % 71 43 - 77 %   Neutro Abs 5.0 1.7 - 7.7 K/uL   Lymphocytes Relative 17 12 - 46 %   Lymphs Abs 1.2 0.7 - 4.0 K/uL   Monocytes Relative 9 3 - 12 %   Monocytes Absolute 0.6 0.1 - 1.0 K/uL   Eosinophils Relative 2 0 - 5 %   Eosinophils Absolute 0.1 0.0 - 0.7 K/uL   Basophils Relative 1 0 - 1 %   Basophils Absolute 0.0 0.0 - 0.1 K/uL  Comprehensive metabolic panel  Result Value Ref Range   Sodium 140 137 - 147 mEq/L   Potassium 4.0 3.7 - 5.3 mEq/L   Chloride 98 96 - 112 mEq/L   CO2 26 19 - 32 mEq/L   Glucose, Bld 240 (H) 70 - 99 mg/dL   BUN 18 6 - 23 mg/dL   Creatinine, Ser 0.93 0.50 - 1.35 mg/dL   Calcium 9.7 8.4 - 10.5 mg/dL   Total Protein 6.9 6.0 - 8.3 g/dL   Albumin 4.0 3.5 - 5.2 g/dL   AST 18 0 - 37 U/L   ALT 31 0 - 53 U/L   Alkaline Phosphatase 67 39 - 117 U/L   Total Bilirubin 0.5 0.3 - 1.2 mg/dL   GFR calc non Af Amer 80 (L) >90 mL/min   GFR calc Af Amer >90 >90 mL/min   Anion gap 16 (H) 5 - 15  Urinalysis, Routine w reflex microscopic  Result Value Ref Range   Color, Urine YELLOW YELLOW   APPearance CLEAR CLEAR   Specific Gravity, Urine 1.012 1.005 - 1.030   pH 7.0 5.0 - 8.0   Glucose, UA 250 (A) NEGATIVE mg/dL   Hgb urine dipstick NEGATIVE NEGATIVE    Bilirubin Urine NEGATIVE NEGATIVE   Ketones, ur NEGATIVE NEGATIVE mg/dL   Protein, ur NEGATIVE NEGATIVE mg/dL   Urobilinogen, UA 0.2 0.0 - 1.0 mg/dL   Nitrite NEGATIVE NEGATIVE   Leukocytes, UA NEGATIVE NEGATIVE  CBG, ED  Result Value Ref Range   Glucose-Capillary 227 (H) 70 - 99 mg/dL   Comment 1 Notify RN    Comment 2 Documented in Chart   POC occult blood, ED RN will collect  Result Value Ref Range   Fecal Occult Bld NEGATIVE NEGATIVE  I-stat chem 8, ed  Result Value Ref Range   Sodium 137 137 - 147 mEq/L   Potassium 3.6 (L) 3.7 - 5.3 mEq/L   Chloride 96 96 - 112 mEq/L   BUN 18 6 - 23 mg/dL   Creatinine, Ser 1.00 0.50 - 1.35 mg/dL   Glucose, Bld 240 (H) 70 - 99 mg/dL   Calcium, Ion 1.16 1.13 - 1.30 mmol/L   TCO2 22 0 - 100 mmol/L   Hemoglobin 16.3 13.0 - 17.0 g/dL   HCT 48.0 39.0 - 52.0 %  I-stat troponin, ED  Result Value Ref Range   Troponin i, poc 0.01 0.00 - 0.08 ng/mL   Comment 3            Imaging Review Dg Chest Portable 1 View  12/15/2013   CLINICAL DATA:  "Lightheaded" , acute onset. Current history of hypertension, diabetes and coronary artery disease with prior MI.  EXAM: PORTABLE CHEST - 1 VIEW  COMPARISON:  Two-view chest x-ray 04/14/2013, 04/17/2011. Portable chest x-rays 04/08/2013, 02/18/2012.  FINDINGS: Markedly suboptimal inspiration which accounts for atelectasis in the lung bases and  crowded bronchovascular markings diffusely. This also accentuates the heart size which is likely mildly enlarged but stable. Thoracic aorta mildly tortuous and atherosclerotic, unchanged. Lungs otherwise clear. No localized airspace consolidation. No pleural effusions. No pneumothorax. Normal pulmonary vascularity.  IMPRESSION: Suboptimal inspiration accounts for bibasilar atelectasis. No acute cardiopulmonary disease otherwise. Stable mild cardiomegaly without pulmonary edema.   Electronically Signed   By: Evangeline Dakin M.D.   On: 12/15/2013 09:09     EKG  Interpretation   Date/Time:  Thursday December 15 2013 08:09:23 EST Ventricular Rate:  70 PR Interval:  195 QRS Duration: 111 QT Interval:  429 QTC Calculation: 463 R Axis:   32 Text Interpretation:  Sinus rhythm Inferior infarct, old No significant  change since last tracing Confirmed by JACUBOWITZ  MD, SAM [phone]) on  12/15/2013 8:47:26 AM      MDM   Final diagnoses:  Lightheaded  Unable to walk  Ataxia   79 y.o. male presents with lightheadedness "Like I'm going to pass out" that began when he woke up this morning. When he went to bed he felt fine. Has been otherwise feeling well. Associated with nausea. No CP, SOB, fever, cough, abdominal pain, black/tarry stools.   Patient ambulated with a steady gait with nursing; felt lightheaded during the walk, but had no difficulty per nursing  9:44 AM Patient continues to endorse lightheadedness; nausea improved with Zofran  11:25 AM Patient continues to feel lightheaded while sitting in the stretcher; no pain anywhere; VSS; will give another liter; labs resulted with no significant abnormality  2:01 PM Patient still endorsing "I'm going to pass out", feels no better; has worsening lightheadedness with head movement; neuro exam unchanged from arrival including normal finger to nose; will give a dose of meclizine  2:08 PM Attempted to ambulate the patient again -- ambulated well with nursing earlier; he was unable to ambulate with a steady gait, felt like he was falling backwards; will obtain an MRI  Concern for possible posterior circulation involvement given ataxia with second time ambulating.   4:12 PM  Patient still awaiting MRI. Awake, alert, face symmetric. Spoke with the hospitalist for admission given that the patient cannot walk.   He was admitted to the hospitalist. MRI pending at the time of admission.   This case managed in conjunction with my attending, Dr. Winfred Leeds.     Berenice Primas, MD 12/15/13 [place], MD 12/15/13 [phone]

## 2013-12-15 NOTE — ED Notes (Signed)
Pt going to MRI and then to 3W after MRI complete.

## 2013-12-15 NOTE — ED Notes (Signed)
Pt reports meclizine was effective and relieved his light headedness a little, but not completely.

## 2013-12-15 NOTE — ED Notes (Signed)
Resident at bedside explaining need for MRI at this time.

## 2013-12-15 NOTE — ED Notes (Signed)
Pt undressed, in gown, on monitor, continuous pulse oximetry and blood pressure cuff; family at bedside 

## 2013-12-15 NOTE — ED Notes (Signed)
Resident completing POC occult.

## 2013-12-15 NOTE — H&P (Signed)
[facility] History and Physical  [name] ZOX:[id number] DOB: Jul 28, 1934 DOA: 12/15/2013  Referring physician:  PCP: [name], MD  Specialists:   Chief Complaint: dizziness   HPI: [name] is a 79 y.o. male with PMH of HTN, HPL, DM, CAD, BL mild hearing loss presented with sudden Oncet of dizziness, lightheadedness upon standing noted 5 AM today. Patient reports dizziness, but no vertigo says "like I am going to pass out"; Pt denies any focal weakness, or paraesthesia; He had some nausea, but no vomiting, no chest pain, no cough, no SOB; no fever, chills.    Review of Systems: The patient denies anorexia, fever, weight loss,, vision loss, decreased hearing, hoarseness, chest pain, syncope, dyspnea on exertion, peripheral edema, balance deficits, hemoptysis, abdominal pain, melena, hematochezia, severe indigestion/heartburn, hematuria, incontinence, genital sores, muscle weakness, suspicious skin lesions, transient blindness, difficulty walking, depression, unusual weight change, abnormal bleeding, enlarged lymph nodes, angioedema, and breast masses.    Past Medical History  Diagnosis Date  . CAD (coronary artery disease)   . History of syncope   . HTN (hypertension)   . Borderline hyperlipidemia   . History of gastritis   . Diverticulosis of colon   . Colon polyps   . Hypertrophy of prostate with urinary obstruction and other lower urinary tract symptoms (LUTS)   . History of pyelonephritis   . DJD (degenerative joint disease)   . Anxiety   . DM (diabetes mellitus)     Adult onset  . 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 History of kidney stones    Past Surgical History  Procedure Laterality Date  . Appendectomy    . Cataract extraction      RIGHT EYE  . Knee surgery      bilateral ARTHROSCOPY X2 TO EACH KNEE  . Shoulder surgery  04/2005    left by Dr. Berenice Primas  . S/p elap 1986  w/excision of leiomyoma @ ge junction, meckle's divertic resected, & cholecystectomy    . S/p cysto & stents for kidnedy stone 11/08 by dr. Reece Agar    . Cystoscopy  04/17/11    stent placed  . Cystoscopy w/ ureteral stent placement  04/17/2011    Procedure: CYSTOSCOPY WITH RETROGRADE PYELOGRAM/URETERAL STENT PLACEMENT;  Surgeon: Hanley Ben, MD;  Location: [facility];  Service: Urology;  Laterality: Left;  . Cystoscopy with retrograde pyelogram, ureteroscopy and stent placement Left 04/18/2013    Procedure: CYSTOSCOPY WITH LEFT RETROGRADE PYELOGRAM, LEFT URETEROSCOPY AND LASER LITHOTRIPSY LEFT STENT PLACEMENT;  Surgeon: Dutch Gray, MD;  Location: [facility];  Service: Urology;  Laterality: Left;  . Holmium laser application Left 09/10/1912    Procedure: HOLMIUM LASER APPLICATION;  Surgeon: Dutch Gray, MD;  Location: [facility];  Service: Urology;  Laterality: Left;   Social History:  reports that he has never smoked. He has never used smokeless tobacco. He reports that he does not drink alcohol or use illicit drugs. Home; does patient live--home, ALF, SNF? and with whom if at home? Yes;  patient participate in ADLs?  Allergies  Allergen Reactions  . Codeine Phosphate Nausea Only    Can take with food and usually doesn't cause nausea  . Morphine Nausea Only    Can take with food and usually doesn't cause nausea    Family History  Problem Relation Age of Onset  . Heart attack Mother   . Dementia  Brother   . Colon cancer Neg Hx   . Prostate cancer Neg Hx     (be sure to complete)  Prior to Admission medications   Medication Sig Start Date End Date Taking? Authorizing Provider  amLODipine (NORVASC) 10 MG tablet Take 1 tablet (10 mg total) by mouth every morning. 11/10/13  Yes Tonia Ghent, MD  aspirin EC 81 MG tablet Take 81 mg by mouth daily.   Yes Historical Provider, MD  glimepiride (AMARYL) 4 MG tablet Take 1 tablet (4 mg total) by mouth daily with breakfast. 11/10/13  Yes Tonia Ghent, MD   lisinopril-hydrochlorothiazide (PRINZIDE,ZESTORETIC) 20-25 MG per tablet Take 1 tablet by mouth daily.   Yes Historical Provider, MD  metFORMIN (GLUCOPHAGE) 1000 MG tablet Take 1 tablet (1,000 mg total) by mouth 2 (two) times daily with a meal. 11/10/13  Yes Tonia Ghent, MD  metoprolol (LOPRESSOR) 50 MG tablet Take 1 tablet (50 mg total) by mouth 2 (two) times daily. 11/10/13  Yes Tonia Ghent, MD  potassium chloride SA (KLOR-CON M20) 20 MEQ tablet Take one by mouth daily. 11/10/13  Yes Tonia Ghent, MD  pravastatin (PRAVACHOL) 40 MG tablet Take 1 tablet (40 mg total) by mouth at bedtime. 11/10/13  Yes Tonia Ghent, MD  tamsulosin (FLOMAX) 0.4 MG CAPS capsule Take 1 capsule (0.4 mg total) by mouth at bedtime. 11/10/13  Yes Tonia Ghent, MD  hydrALAZINE (APRESOLINE) 25 MG tablet TAKE 1 TABLET BY MOUTH 2 TIMES A DAY. 11/10/13   Tonia Ghent, MD  lisinopril-hydrochlorothiazide (PRINZIDE,ZESTORETIC) 20-25 MG per tablet TAKE 1 TABLET BY MOUTH EVERY MORNING. 11/10/13   Tonia Ghent, MD   Physical Exam: Filed Vitals:   12/15/13 1630  BP: 160/82  Pulse: 87  Temp:   Resp: 17     General:  alert  Eyes: eom-i, perrla   ENT: no oral ulcers   Neck: supple, no JVD  Cardiovascular: s1,s2 rrr  Respiratory: CTA BL  Abdomen: soft, nt,nd   Skin: no rash   Musculoskeletal: no LE edema  Psychiatric: no hallucinations   Neurologic: CN 2-12 intact; dick's haulpike negative;   Labs on Admission:  Basic Metabolic Panel:  Recent Labs Lab 12/15/13 0849 12/15/13 0926  NA 140 137  K 4.0 3.6*  CL 98 96  CO2 26  --   GLUCOSE 240* 240*  BUN 18 18  CREATININE 0.93 1.00  CALCIUM 9.7  --    Liver Function Tests:  Recent Labs Lab 12/15/13 0849  AST 18  ALT 31  ALKPHOS 67  BILITOT 0.5  PROT 6.9  ALBUMIN 4.0   No results for input(s): LIPASE, AMYLASE in the last 168 hours. No results for input(s): AMMONIA in the last 168 hours. CBC:  Recent Labs Lab 12/15/13 0849  12/15/13 0926  WBC 7.0  --   NEUTROABS 5.0  --   HGB 15.7 16.3  HCT 44.5 48.0  MCV 92.5  --   PLT 272  --    Cardiac Enzymes: No results for input(s): CKTOTAL, CKMB, CKMBINDEX, TROPONINI in the last 168 hours.  BNP (last 3 results) No results for input(s): PROBNP in the last 8760 hours. CBG:  Recent Labs Lab 12/15/13 0812  GLUCAP 227*    Radiological Exams on Admission: Dg Chest Portable 1 View  12/15/2013   CLINICAL DATA:  "Lightheaded" , acute onset. Current history of hypertension, diabetes and coronary artery disease with prior MI.  EXAM: PORTABLE CHEST - 1 VIEW  COMPARISON:  Two-view chest x-ray 04/14/2013, 04/17/2011. Portable chest x-rays 04/08/2013, 02/18/2012.  FINDINGS: Markedly suboptimal inspiration which accounts for atelectasis in the lung bases and crowded bronchovascular markings diffusely. This also accentuates the heart size which is likely mildly enlarged but stable. Thoracic aorta mildly tortuous and atherosclerotic, unchanged. Lungs otherwise clear. No localized airspace consolidation. No pleural effusions. No pneumothorax. Normal pulmonary vascularity.  IMPRESSION: Suboptimal inspiration accounts for bibasilar atelectasis. No acute cardiopulmonary disease otherwise. Stable mild cardiomegaly without pulmonary edema.   Electronically Signed   By: Evangeline Dakin M.D.   On: 12/15/2013 09:09    EKG: Independently reviewed.   Assessment/Plan Active Problems:   Chest pain   Lightheaded   Dizziness  79 y.o. male with PMH of HTN, HPL, DM, CAD, BL mild hearing loss presented with sudden oncet of dizziness, lightheadedness upon standing noted 5 AM today  1. Dizziness, Bl hearing loss, h/o tinnitus: probable meniere's disease  -neuro exam non focal, dix-haulpike negative; orthostatics negative;  obtain PT/OT; check UA -obtain MRI to r/o post circ infarcts; cont ASA; monitor on tele  2. HTN not at goal; resume home meds; titrate per response  3. DM, ha1c  (11/2013): 7.5; hold PO meds; ISS;  4. H/o CAD; no acute chest pains; cont home regimen; ASA, BB, statins;   None;  if consultant consulted, please document name and whether formally or informally consulted  Code Status: full (must indicate code status--if unknown or must be presumed, indicate so) Family Communication: ; d/w patient, his daughter  (indicate person spoken with, if applicable, with phone number if by telephone) Disposition Plan: pend PT eval; MRI (indicate anticipated LOS)  Time spent: >35 minutes   [place], [facility] Pager [phone]  If 7PM-7AM, please contact night-coverage [email or web address] Password [email or web address] 12/15/2013, 4:53 PM

## 2013-12-16 ENCOUNTER — Observation Stay (HOSPITAL_COMMUNITY): Payer: Medicare Other

## 2013-12-16 DIAGNOSIS — E785 Hyperlipidemia, unspecified: Secondary | ICD-10-CM

## 2013-12-16 LAB — GLUCOSE, CAPILLARY
GLUCOSE-CAPILLARY: 175 mg/dL — AB (ref 70–99)
GLUCOSE-CAPILLARY: 260 mg/dL — AB (ref 70–99)
Glucose-Capillary: 220 mg/dL — ABNORMAL HIGH (ref 70–99)
Glucose-Capillary: 264 mg/dL — ABNORMAL HIGH (ref 70–99)

## 2013-12-16 MED ORDER — ALPRAZOLAM 0.25 MG PO TABS: 0.2500 mg | ORAL_TABLET | Freq: Once | ORAL | Status: DC

## 2013-12-16 MED ORDER — IOHEXOL 350 MG/ML SOLN
80.0000 mL | Freq: Once | INTRAVENOUS | Status: AC | PRN
  Administered 2013-12-16: 80 mL via INTRAVENOUS

## 2013-12-16 MED ORDER — DIAZEPAM 5 MG PO TABS
2.5000 mg | ORAL_TABLET | Freq: Two times a day (BID) | ORAL | Status: DC
  Administered 2013-12-16 – 2013-12-17 (×2): 2.5 mg via ORAL
  Filled 2013-12-16 (×2): qty 1

## 2013-12-16 NOTE — Care Management Note (Unsigned)
    Page 1 of 1   12/16/2013     2:49:56 PM CARE MANAGEMENT NOTE 12/16/2013  Patient:  [name], [name]   Account Number:  [id number]  Date Initiated:  12/16/2013  Documentation initiated by:  McNally,Kristin  Subjective/Objective Assessment:   pt presents for lighthead, dizziness, htn     Action/Plan:   Pt from home alone, has friend who lives across the street who checks on him.   Anticipated DC Date:  12/17/2013   Anticipated DC Plan:  SKILLED NURSING FACILITY  In-house referral  Clinical Social Worker      DC Planning Services  CM consult      Choice offered to / List presented to:             Status of service:  In process, will continue to follow Medicare Important Message given?   (If response is "NO", the following Medicare IM given date fields will be blank) Date Medicare IM given:   Medicare IM given by:   Date Additional Medicare IM given:   Additional Medicare IM given by:    Discharge Disposition:    Per UR Regulation:  Reviewed for med. necessity/level of care/duration of stay  If discussed at [place] of Stay Meetings, dates discussed:    Comments:  12/16/13 kristin Mcnally RN Met with pt to discuss dc planning. Pt states he would like to pursue SNF and his choice of facility is [facility] since it is close to home. Consulted SSW to follow case and plan of care. Aware of this may not be possible and CM will continue to follow for any HHC needs if SNF not approved.

## 2013-12-16 NOTE — Plan of Care (Signed)
Problem: Phase I Progression Outcomes Goal: OOB as tolerated unless otherwise ordered Outcome: Completed/Met Date Met:  12/16/13 Goal: Initial discharge plan identified Outcome: Completed/Met Date Met:  12/16/13

## 2013-12-16 NOTE — Evaluation (Signed)
Occupational Therapy Evaluation Patient Details Name: [name] MRN: [id number] DOB: 31-Jul-1934 Today's Date: 12/16/2013    History of Present Illness : [name] is a 79 y.o. male with PMH of HTN, HPL, DM, CAD, BL mild hearing loss presented with sudden Oncet of dizziness, lightheadedness upon standing noted 5 AM today. Patient reports dizziness, but no vertigo says "like I am going to pass out"; Pt denies any focal weakness, or paraesthesia; He had some nausea, but no vomiting, no chest pain, no cough, no SOB; no fever, chills.     Clinical Impression   Pt. Was in bathroom when therapist entered room. Pt. Was able to perform sit to stand at [facility] guard A level and "AMB to sink unsteadily. Pt. Was able to stand and wash hands at [place]. Pt. Sat EOB and stated he did not feel well and nursing was called. Pt. Started to shake and pt. Was laid down. Pt. Had jerking movement that did lesson after pt. Laid down. Pt. Vitals were checked by nursing and then were checked in sitting and standing. Orthostatic hypotension was not noted. Pt. Complained of nausea with nursing aware. Pt. Nurse wants vestibular eval and PT was informed. Pt. Will need further OT to maximize safety with ADLs and mobility.     Follow Up Recommendations  SNF    Equipment Recommendations       Recommendations for Other Services       Precautions / Restrictions Precautions Precautions: Fall Restrictions Weight Bearing Restrictions: No      Mobility Bed Mobility Overal bed mobility: Needs Assistance Bed Mobility: Sidelying to Sit   Sidelying to sit: Min assist          Transfers Overall transfer level: Needs assistance   Transfers: Sit to/from Stand Sit to Stand: Min guard              Balance                                            ADL Overall ADL's : Needs assistance/impaired Eating/Feeding: Independent   Grooming: Wash/dry hands;Wash/dry face;Min  guard;Standing   Upper Body Bathing: Supervision/ safety;Set up;Sitting   Lower Body Bathing: Min guard;Sit to/from stand   Upper Body Dressing : Supervision/safety;Sitting   Lower Body Dressing: Min guard;Sit to/from stand   Toilet Transfer: Min guard;Ambulation;Comfort height toilet   Toileting- Clothing Manipulation and Hygiene: Min guard;Sit to/from stand       Functional mobility during ADLs: Minimal assistance General ADL Comments: Pt. required Min A for hand held A for AMB.     Vision                     Perception     Praxis      Pertinent Vitals/Pain Pain Assessment: No/denies pain     Hand Dominance Right   Extremity/Trunk Assessment Upper Extremity Assessment Upper Extremity Assessment: Generalized weakness           Communication Communication Communication: No difficulties   Cognition Arousal/Alertness: Awake/alert Behavior During Therapy: Anxious Overall Cognitive Status: Within Functional Limits for tasks assessed                     General Comments       Exercises       Shoulder Instructions      Home  Living Family/patient expects to be discharged to:: Private residence Living Arrangements: Alone                 Bathroom Shower/Tub: Tub/shower unit Shower/tub characteristics: Architectural technologist: Standard Bathroom Accessibility: Yes How Accessible: Accessible via wheelchair            Prior Functioning/Environment Level of Independence: Independent             OT Diagnosis: Generalized weakness   OT Problem List: Decreased strength;Decreased activity tolerance;Impaired balance (sitting and/or standing)   OT Treatment/Interventions:      OT Goals(Current goals can be found in the care plan section) Acute Rehab OT Goals Patient Stated Goal:  (get better) OT Goal Formulation: With patient/family Time For Goal Achievement: 12/30/13 Potential to Achieve Goals: Good  OT Frequency: Min  2X/week   Barriers to D/C: Decreased caregiver support   (Pt. may need ST SNF for rehab.)       Co-evaluation              End of Session Nurse Communication: Other (comment)  Activity Tolerance:   Patient left: in bed;with nursing/sitter in room   Time: 9381-8299 OT Time Calculation (min): 53 min Charges:  OT General Charges $OT Visit: 1 Procedure OT Evaluation $Initial OT Evaluation Tier I: 1 Procedure OT Treatments $Self Care/Home Management : 23-37 mins $Therapeutic Activity: 8-22 mins G-Codes: OT G-codes **NOT FOR INPATIENT CLASS** Functional Limitation: Self care Self Care Current Status (B7169): At least 1 percent but less than 20 percent impaired, limited or restricted Self Care Goal Status (C7893): 0 percent impaired, limited or restricted  Bertha Earwood 12/16/2013, 12:18 PM

## 2013-12-16 NOTE — Evaluation (Addendum)
Physical Therapy Evaluation Patient Details Name: [name] MRN: [id number] DOB: May 27, 1934 Today's Date: 12/16/2013   History of Present Illness  [name] is a 79 y.o. male with PMH of HTN, HPL, DM, CAD, BL mild hearing loss presented with sudden Oncet of dizziness, lightheadedness upon standing noted 5 AM today. Patient reports dizziness, but no vertigo says "like I am going to pass out"; Pt denies any focal weakness, or paraesthesia; He had some nausea, but no vomiting, no chest pain, no cough, no SOB; no fever, chills.    Clinical Impression  Pt admitted with above. Pt currently with functional limitations due to the deficits listed below (see PT Problem List). Pt agreeable to go to NH for vestibular rehab and this PT agrees with plan as well as pt and sister.  Once pt discharges from NH, should transition to [place] [facility] for vestibular rehab when he is cleared to drive by MD.  Pt will benefit from skilled PT to increase their independence and safety with mobility to allow discharge to the venue listed below.     Follow Up Recommendations SNF (for vestibular rehab with transition to Outpt. PT when ready)    Equipment Recommendations  TBA    Recommendations for Other Services       Precautions / Restrictions Precautions Precautions: Fall Restrictions Weight Bearing Restrictions: No      Mobility  Bed Mobility Overal bed mobility: Needs Assistance Bed Mobility: Sidelying to Sit   Sidelying to sit: Supervision          Transfers Overall transfer level: Needs assistance Equipment used: Rolling walker (2 wheeled) Transfers: Sit to/from Stand Sit to Stand: Min guard         General transfer comment: Steady when standing to RW.  Pt can stand without rW but appears unsteady due to bil knee pain.  Ambulation/Gait Ambulation/Gait assistance: Modified independent (Device/Increase time) Ambulation Distance (Feet): 400 Feet Assistive device: Rolling walker (2  wheeled) Gait Pattern/deviations: Step-through pattern;Decreased stride length   Gait velocity interpretation: at or above normal speed for age/gender General Gait Details: Pt able to steer RW and manage it safely.  Pt ambulates best with RW as he is slightly unsteady without RW.  In controlled environment, pt can ambulate with or without RW but in challenging environment, will need RW.  Pt aware.  Given that pt had episode this am with difficulty managing in bathroom, may benefit from SNF and is willing to go for therapy.    Stairs            Wheelchair Mobility    Modified Rankin (Stroke Patients Only)       Balance Overall balance assessment: Needs assistance;History of Falls Sitting-balance support: No upper extremity supported;Feet supported Sitting balance-Leahy Scale: Good     Standing balance support: Bilateral upper extremity supported;During functional activity Standing balance-Leahy Scale: Fair Standing balance comment: can stand statically without use of RW and balance.  however needs RW for standing with challenges to balance.               High level balance activites: Head turns;Sudden stops;Turns;Direction changes High Level Balance Comments: Needs RW to perform above safely.  Educated in some compensation techniques for gaze stability with ambulation.               Pertinent Vitals/Pain Pain Assessment: No/denies pain  VSS    Home Living Family/patient expects to be discharged to:: Private residence Living Arrangements: Alone Available Help at Discharge:  Family;Available PRN/intermittently (sister close by) Type of Home: House Home Access: Stairs to enter Entrance Stairs-Rails: Right Entrance Stairs-Number of Steps: 3 Home Layout: One level Home Equipment: Cane - quad Additional Comments: Pt mows his and sister's yard, drives.     Prior Function Level of Independence: Independent               Hand Dominance   Dominant Hand:  Right    Extremity/Trunk Assessment   Upper Extremity Assessment: Defer to OT evaluation           Lower Extremity Assessment: Generalized weakness      Cervical / Trunk Assessment: Normal  Communication   Communication: No difficulties  Cognition Arousal/Alertness: Awake/alert Behavior During Therapy: Anxious Overall Cognitive Status: Within Functional Limits for tasks assessed                      General Comments General comments (skin integrity, edema, etc.): Pt negative for horizontal canal BPPV per supine head roll bil as well as negative for Hallpike dix bil.  Pt with positive head thrust right suggesting right hypofunction.  Pt initiated x1 exercises.      Exercises Other Exercises Other Exercises: x1 exercises- perform side to side x2 and up and down x2 for up to 1 minute in sitting 3-5xday.  handout given.        Assessment/Plan    PT Assessment Patient needs continued PT services  PT Diagnosis Generalized weakness (dizziness)   PT Problem List Decreased activity tolerance;Decreased balance;Decreased mobility;Decreased knowledge of use of DME;Decreased safety awareness;Decreased knowledge of precautions  PT Treatment Interventions DME instruction;Gait training;Functional mobility training;Therapeutic activities;Therapeutic exercise;Balance training;Stair training;Patient/family education (gaze stability exercises)   PT Goals (Current goals can be found in the Care Plan section) Acute Rehab PT Goals Patient Stated Goal: to get better PT Goal Formulation: With patient Time For Goal Achievement: 12/23/13 Potential to Achieve Goals: Good    Frequency Min 3X/week   Barriers to discharge Decreased caregiver support      Co-evaluation               End of Session Equipment Utilized During Treatment: Gait belt Activity Tolerance: Patient tolerated treatment well Patient left: in bed;with call bell/phone within reach;with family/visitor  present Nurse Communication: Mobility status    Functional Assessment Tool Used: clinical judgment Functional Limitation: Mobility: Walking and moving around Mobility: Walking and Moving Around Current Status [phone]): At least 1 percent but less than 20 percent impaired, limited or restricted Mobility: Walking and Moving Around Goal Status [phone]): 0 percent impaired, limited or restricted    Time: 1148-1240 PT Time Calculation (min): 52 min   Charges:   PT Evaluation $Initial PT Evaluation Tier I: 1 Procedure PT Treatments $Gait Training: 8-22 mins $Therapeutic Exercise: 8-22 mins $Self Care/Home Management: 8-22   PT G Codes:   Functional Assessment Tool Used: clinical judgment Functional Limitation: Mobility: Walking and moving around    [place], [place] F 12/16/2013, 2:19 PM [employer] Acute [facility] [phone] (pager)

## 2013-12-16 NOTE — Progress Notes (Signed)
UR completed 

## 2013-12-16 NOTE — Clinical Social Work Placement (Addendum)
    Clinical Social Work Department CLINICAL SOCIAL WORK PLACEMENT NOTE 12/16/2013  Patient:  YADER, CRIGER  Account Number:  [id number] Admit date:  12/15/2013  Clinical Social Worker:  Berton Mount, Latanya Presser  Date/time:  12/16/2013 02:12 PM  Clinical Social Work is seeking post-discharge placement for this patient at the following level of care:   SKILLED NURSING   (*CSW will update this form in Epic as items are completed)   12/16/2013  Patient/family provided with [facility] Department of Clinical Social Work's list of facilities offering this level of care within the geographic area requested by the patient (or if unable, by the patient's family).  12/16/2013  Patient/family informed of their freedom to choose among providers that offer the needed level of care, that participate in Medicare, Medicaid or managed care program needed by the patient, have an available bed and are willing to accept the patient.  12/16/2013  Patient/family informed of [facility]' ownership interest in [facility], as well as of the fact that they are under no obligation to receive care at this facility.  PASARR submitted to EDS on 12/16/2013 PASARR number received on 12/16/2013  FL2 transmitted to all facilities in geographic area requested by pt/family on  12/16/2013 FL2 transmitted to all facilities within larger geographic area on   Patient informed that his/her managed care company has contracts with or will negotiate with  certain facilities, including the following:     Patient/family informed of bed offers received:   Patient chooses bed at  Physician recommends and patient chooses bed at    Patient to be transferred to  on  12/17/2013 Patient to be transferred to facility by [facility] [place] Patient and family notified of transfer on 12/17/2013 Name of family member notified:  Burnett Corrente (sister)  The following physician request were entered in  Epic: Physician Request  Please sign FL2.    Additional CommentsBerton Mount, [place]

## 2013-12-16 NOTE — Progress Notes (Signed)
[facility]                                                                              Patient Demographics  [name], is a 79 y.o. male, DOB - 1934/04/10, ZGY:[id number]  Admit date - 12/15/2013   Admitting Physician Kinnie Feil, MD  Outpatient Primary MD for the patient is [name], MD  LOS - 1   Chief Complaint  Patient presents with  . Dizziness  . Hypertension      HPI on 12/15/2013 by Dr. Elisabeth Cara is a 79 y.o. male with PMH of HTN, HPL, DM, CAD, BL mild hearing loss presented with sudden Oncet of dizziness, lightheadedness upon standing noted 5 AM today. Patient reports dizziness, but no vertigo says "like I am going to pass out"; Pt denies any focal weakness, or paraesthesia; He had some nausea, but no vomiting, no chest pain, no cough, no SOB; no fever, chills.  Assessment & Plan   Dizziness/ possible Mnire's disease -Patient does have history of bilateral hearing loss as well as tinnitus -PT and OT as well as vestibular therapy consulted for evaluation and treatment -Neuro exam is nonfocal, Dix-Hallpike maneuver was also negative -Orthostatic vitals were negative -spoke with neurology via phone, recommended starting patient on low dose Valium and obtaining CTA of the head and neck  Hypertension -Continue amlodipine, hydralazine, lisinopril, metoprolol   Diabetes mellitus, type II -Continue ISS  -HbA1c 7.5 (11/10/2013)  History of coronary artery disease -Patient currently denies any chest pain -Continue home medications including aspirin, beta blocker, statin  Hypokalemia -Likely secondary to diuretics -Will replace and continue to monitor  Hyperlipidemia -Continue statin  Code Status: full  Family Communication:  None at bedside  Disposition Plan: admitted for observation  Time Spent in minutes   30 minutes  Procedures  none  Consults   Neurology, via phone  DVT Prophylaxis  Lovenox  Lab Results    Component Value Date   PLT 272 12/15/2013    Medications  Scheduled Meds: . amLODipine  10 mg Oral q morning - 10a  . aspirin  325 mg Oral Daily  . diazepam  2.5 mg Oral BID  . enoxaparin (LOVENOX) injection  40 mg Subcutaneous Q24H  . hydrALAZINE  25 mg Oral 3 times per day  . lisinopril  20 mg Oral Daily   And  . hydrochlorothiazide  25 mg Oral Daily  . insulin aspart  0-9 Units Subcutaneous TID WC  . metoprolol  50 mg Oral BID  . potassium chloride SA  10 mEq Oral Daily  . pravastatin  40 mg Oral QHS  . sodium chloride  3 mL Intravenous Q12H  . tamsulosin  0.4 mg Oral QHS   Continuous Infusions:  PRN Meds:.acetaminophen **OR** acetaminophen, hydrALAZINE, ondansetron **OR** ondansetron (ZOFRAN) IV  Antibiotics    Anti-infectives    None      Subjective:   [name] seen and examined today.  Patient continues feeling of feeling dizzy even with lying down.  He denies SOB, chest pain, abdominal pain, vomiting.  He does have some nausea.     Objective:   Filed Vitals:   12/15/13 1731  12/15/13 2100 12/16/13 0617 12/16/13 1020  BP: 159/67 169/85 133/69 154/82  Pulse: 82 85 68   Temp:  98.4 F (36.9 C) 98.2 F (36.8 C)   TempSrc:  Oral Oral   Resp: 18 18    Height:  6' (1.829 m)    Weight:  102.83 kg (226 lb 11.2 oz) 101.515 kg (223 lb 12.8 oz)   SpO2: 97% 93% 93%     Wt Readings from Last 3 Encounters:  12/16/13 101.515 kg (223 lb 12.8 oz)  11/10/13 104.781 kg (231 lb)  05/11/13 102.626 kg (226 lb 4 oz)     Intake/Output Summary (Last 24 hours) at 12/16/13 1353 Last data filed at 12/16/13 1302  Gross per 24 hour  Intake    723 ml  Output    600 ml  Net    123 ml    Exam  General: Well developed, well nourished, NAD, appears stated age  36: NCAT, mucous membranes moist.   Cardiovascular: S1 S2 auscultated, no rubs, murmurs or gallops. Regular rate and rhythm.  Respiratory: Clear to auscultation bilaterally with equal chest  rise  Abdomen: Soft, nontender, nondistended, + bowel sounds  Extremities: warm dry without cyanosis clubbing or edema  Neuro: AAOx3, cranial nerves grossly intact, decreased hearing in more in the right ear, Strength equal and bilateral in upper/lower ext  Psych: Normal affect and demeanor with intact judgement and insight  Data Review   Micro Results No results found for this or any previous visit (from the past 240 hour(s)).  Radiology Reports Mr Brain Wo Contrast  12/15/2013   CLINICAL DATA:  Initial evaluation for sudden onset dizziness, lightheadedness. Inability to walk.  EXAM: MRI HEAD WITHOUT CONTRAST  TECHNIQUE: Multiplanar, multiecho pulse sequences of the brain and surrounding structures were obtained without intravenous contrast.  COMPARISON:  None available.  FINDINGS: Study is somewhat degraded by motion artifact.  Diffuse prominence of the CSF containing spaces is compatible with generalized cerebral atrophy, moderate for patient age. No significant white matter changes present. No mass lesion, mass effect, or midline shift. Ventricles are normal in size without evidence of hydrocephalus. No extra-axial fluid collection.  No abnormal foci of restricted diffusion to suggest acute intracranial infarct. Gray-white matter differentiation maintained. Normal flow voids seen within the intracranial vasculature.  Craniocervical junction is normal. Pituitary gland within normal limits.  No acute abnormality seen about the orbits.  Paranasal sinuses are clear. Scattered fluid signal intensity present within the right mastoid air cells. Left mastoid air cells are clear.  Bone marrow signal intensity is normal. Scalp soft tissues within normal limits.  IMPRESSION: 1. No acute intracranial infarct or other abnormality identified. 2. Moderate generalized cerebral atrophy.   Electronically Signed   By: Jeannine Boga M.D.   On: 12/15/2013 22:07   Dg Chest Portable 1 View  12/15/2013    CLINICAL DATA:  "Lightheaded" , acute onset. Mathews history of hypertension, diabetes and coronary artery disease with prior MI.  EXAM: PORTABLE CHEST - 1 VIEW  COMPARISON:  Two-view chest x-ray 04/14/2013, 04/17/2011. Portable chest x-rays 04/08/2013, 02/18/2012.  FINDINGS: Markedly suboptimal inspiration which accounts for atelectasis in the lung bases and crowded bronchovascular markings diffusely. This also accentuates the heart size which is likely mildly enlarged but stable. Thoracic aorta mildly tortuous and atherosclerotic, unchanged. Lungs otherwise clear. No localized airspace consolidation. No pleural effusions. No pneumothorax. Normal pulmonary vascularity.  IMPRESSION: Suboptimal inspiration accounts for bibasilar atelectasis. No acute cardiopulmonary disease otherwise. Stable mild cardiomegaly without  pulmonary edema.   Electronically Signed   By: Evangeline Dakin M.D.   On: 12/15/2013 09:09    CBC  Recent Labs Lab 12/15/13 0849 12/15/13 0926  WBC 7.0  --   HGB 15.7 16.3  HCT 44.5 48.0  PLT 272  --   MCV 92.5  --   MCH 32.6  --   MCHC 35.3  --   RDW 13.4  --   LYMPHSABS 1.2  --   MONOABS 0.6  --   EOSABS 0.1  --   BASOSABS 0.0  --     Chemistries   Recent Labs Lab 12/15/13 0849 12/15/13 0926  NA 140 137  K 4.0 3.6*  CL 98 96  CO2 26  --   GLUCOSE 240* 240*  BUN 18 18  CREATININE 0.93 1.00  CALCIUM 9.7  --   AST 18  --   ALT 31  --   ALKPHOS 67  --   BILITOT 0.5  --    ------------------------------------------------------------------------------------------------------------------ estimated creatinine clearance is 77.5 mL/min (by C-G formula based on Cr of 1). ------------------------------------------------------------------------------------------------------------------ No results for input(s): HGBA1C in the last 72 hours. ------------------------------------------------------------------------------------------------------------------ No results for  input(s): CHOL, HDL, LDLCALC, TRIG, CHOLHDL, LDLDIRECT in the last 72 hours. ------------------------------------------------------------------------------------------------------------------ No results for input(s): TSH, T4TOTAL, T3FREE, THYROIDAB in the last 72 hours.  Invalid input(s): FREET3 ------------------------------------------------------------------------------------------------------------------ No results for input(s): VITAMINB12, FOLATE, FERRITIN, TIBC, IRON, RETICCTPCT in the last 72 hours.  Coagulation profile No results for input(s): INR, PROTIME in the last 168 hours.  No results for input(s): DDIMER in the last 72 hours.  Cardiac Enzymes No results for input(s): CKMB, TROPONINI, MYOGLOBIN in the last 168 hours.  Invalid input(s): CK ------------------------------------------------------------------------------------------------------------------ Invalid input(s): POCBNP    Luvia Orzechowski D.O. on 12/16/2013 at 1:53 PM  Between 7am to 7pm - Pager - [phone]  After 7pm go to [email or web address] - password [email or web address]  And look for the night coverage person covering for me after hours  [facility] Office  [phone]

## 2013-12-16 NOTE — Plan of Care (Signed)
Problem: Consults Goal: General Medical Patient Education See Patient Education Module for specific education.  Outcome: Completed/Met Date Met:  12/16/13 Goal: Diabetes Guidelines if Diabetic/Glucose > 140 If diabetic or lab glucose is > 140 mg/dl - Initiate Diabetes/Hyperglycemia Guidelines & Document Interventions  Outcome: Completed/Met Date Met:  12/16/13  Problem: Phase I Progression Outcomes Goal: Pain controlled with appropriate interventions Outcome: Completed/Met Date Met:  12/16/13 Goal: Voiding-avoid urinary catheter unless indicated Outcome: Completed/Met Date Met:  12/16/13 Goal: Hemodynamically stable Outcome: Completed/Met Date Met:  12/16/13

## 2013-12-16 NOTE — Clinical Social Work Psychosocial (Signed)
     Clinical Social Work Department BRIEF PSYCHOSOCIAL ASSESSMENT 12/16/2013  Patient:  [name], [name]     Account Number:  [id number]     Admit date:  12/15/2013  Clinical Social Worker:  Adair Laundry  Date/Time:  12/16/2013 02:06 PM  Referred by:  Physician  Date Referred:  12/16/2013 Referred for  SNF Placement   Other Referral:   Interview type:  Patient Other interview type:   Spoke with pt and pt sister at bedside    PSYCHOSOCIAL DATA Living Status:  ALONE Admitted from facility:   Level of care:   Primary support name:  Clayton Lefort Primary support relationship to patient:  SIBLING Degree of support available:   Pt has good family support    CURRENT CONCERNS Current Concerns  Post-Acute Placement   Other Concerns:    SOCIAL WORK ASSESSMENT / PLAN CSW notified by MD that pt will need SNF. CSW visited pt room and spoke with pt and pt sister about recommendation. They were confused at first because they notified CSW that PT had informed them that recommendation was for home health services. CSW notified family that OT is recommending SNF. CSW asked pt if he felt comfortable to dc home alone. Pt and pt sister both expressed that if SNF is an option they feel this would be safer for a at least a few days. Pt expressed that he would only want to go to SNF though if it was at [facility]. CSW confirmed that pt did not want CSW to send referral to other facilities. Pt and pt sister both confirmed that if pt cannot dc to [facility] they would like for pt to dc home with home health services.   Assessment/plan status:  Psychosocial Support/Ongoing Assessment of Needs Other assessment/ plan:   Information/referral to community resources:   SNF list denied    PATIENTS/FAMILYS RESPONSE TO PLAN OF CARE: Pt and pt sister feel SNF would be safer dc option. Agreeable to dc to only one facility or would like to dc home.

## 2013-12-17 DIAGNOSIS — E876 Hypokalemia: Secondary | ICD-10-CM | POA: Insufficient documentation

## 2013-12-17 DIAGNOSIS — Z8679 Personal history of other diseases of the circulatory system: Secondary | ICD-10-CM | POA: Insufficient documentation

## 2013-12-17 LAB — GLUCOSE, CAPILLARY
GLUCOSE-CAPILLARY: 198 mg/dL — AB (ref 70–99)
Glucose-Capillary: 267 mg/dL — ABNORMAL HIGH (ref 70–99)

## 2013-12-17 LAB — BASIC METABOLIC PANEL
ANION GAP: 17 — AB (ref 5–15)
BUN: 19 mg/dL (ref 6–23)
CALCIUM: 9.4 mg/dL (ref 8.4–10.5)
CO2: 24 mEq/L (ref 19–32)
Chloride: 100 mEq/L (ref 96–112)
Creatinine, Ser: 1.11 mg/dL (ref 0.50–1.35)
GFR calc non Af Amer: 63 mL/min — ABNORMAL LOW (ref >90)
GFR, EST AFRICAN AMERICAN: 73 mL/min — AB (ref >90)
Glucose, Bld: 180 mg/dL — ABNORMAL HIGH (ref 70–99)
Potassium: 3.5 mEq/L — ABNORMAL LOW (ref 3.7–5.3)
Sodium: 141 mEq/L (ref 137–147)

## 2013-12-17 MED ORDER — POTASSIUM CHLORIDE CRYS ER 20 MEQ PO TBCR
40.0000 meq | EXTENDED_RELEASE_TABLET | Freq: Once | ORAL | Status: AC
  Administered 2013-12-17: 40 meq via ORAL
  Filled 2013-12-17: qty 2

## 2013-12-17 MED ORDER — METFORMIN HCL 1000 MG PO TABS: 1000.0000 mg | ORAL_TABLET | Freq: Two times a day (BID) | ORAL | Status: DC

## 2013-12-17 MED ORDER — DIAZEPAM 5 MG PO TABS: 2.5000 mg | ORAL_TABLET | Freq: Two times a day (BID) | ORAL | Status: DC

## 2013-12-17 NOTE — Progress Notes (Signed)
Physical Therapy Treatment Patient Details Name: [name] MRN: [id number] DOB: 1934-09-15 Today's Date: 12/17/2013    History of Present Illness [name] is a 79 y.o. male with PMH of HTN, HPL, DM, CAD, BL mild hearing loss presented with sudden Oncet of dizziness, lightheadedness upon standing noted 5 AM today. Patient reports dizziness, but no vertigo says "like I am going to pass out"; Pt denies any focal weakness, or paraesthesia; He had some nausea, but no vomiting, no chest pain, no cough, no SOB; no fever, chills.      PT Comments    Patient making improvements with mobility.  Agree with need for SNF - patient lives alone.  Currently requires assist for mobility for safety.  Will also continue Vestibular Rehab at SNF.  Follow Up Recommendations  SNF;Supervision/Assistance - 24 hour (for Vestibular Rehab with transition to OP PT when ready.)     Equipment Recommendations  Other (comment) (TBD at SNF)    Recommendations for Other Services       Precautions / Restrictions Precautions Precautions: Fall Restrictions Weight Bearing Restrictions: No    Mobility  Bed Mobility Overal bed mobility: Modified Independent Bed Mobility: Sidelying to Sit   Sidelying to sit: Modified independent (Device/Increase time)       General bed mobility comments: Patient requires increased time to move to sitting EOB.  Transfers Overall transfer level: Needs assistance Equipment used: None Transfers: Sit to/from Stand Sit to Stand: Min guard         General transfer comment: Verbal cues for hand placement.  Assist for safety/balance only.  Ambulation/Gait Ambulation/Gait assistance: Min guard Ambulation Distance (Feet): 200 Feet Assistive device: None Gait Pattern/deviations: Step-through pattern;Decreased stride length;Staggering left;Staggering right Gait velocity: Decreased Gait velocity interpretation: Below normal speed for age/gender General Gait Details:  Patient slightly unsteady without RW.  Reached for rail in hallway x2 for support.  Patient fatigued quickly today.  Reports slight dizziness of 3/10.  Encouraged patient to use RW at home.   Stairs            Wheelchair Mobility    Modified Rankin (Stroke Patients Only)       Balance                                    Cognition Arousal/Alertness: Awake/alert Behavior During Therapy: WFL for tasks assessed/performed Overall Cognitive Status: Within Functional Limits for tasks assessed                      Exercises Other Exercises Other Exercises: Reviewed x1 exercises done in sitting.  Perform horizontally and vertically up to 1 minute, 2x each.  Patient able to demonstrate exercises.    General Comments        Pertinent Vitals/Pain Pain Assessment: No/denies pain    Home Living                      Prior Function            PT Goals (current goals can now be found in the care plan section) Progress towards PT goals: Progressing toward goals    Frequency  Min 3X/week    PT Plan Current plan remains appropriate    Co-evaluation             End of Session Equipment Utilized During Treatment: Gait belt Activity Tolerance: Patient tolerated  treatment well Patient left: in bed;with call bell/phone within reach;with nursing/sitter in room (sitting EOB)     Time: 7493-5521 PT Time Calculation (min): 27 min  Charges:  $Gait Training: 8-22 mins $Therapeutic Exercise: 8-22 mins                    G Codes:  Functional Assessment Tool Used: clinical judgment Functional Limitation: Mobility: Walking and moving around Mobility: Walking and Moving Around Goal Status [phone]): 0 percent impaired, limited or restricted Mobility: Walking and Moving Around Discharge Status [phone]): At least 1 percent but less than 20 percent impaired, limited or restricted   Despina Pole 12/17/2013, 6:26 PM Carita Pian. Sanjuana Kava, [place] Pager [phone]

## 2013-12-17 NOTE — Clinical Social Work Note (Signed)
CSW made aware patient ready for d/c to [facility], by RN, Mickel Baas. CSW contacted facility and spoke with Benjamine Mola who confirmed bed availability and states sister is currently at facility completing paperwork, and is aware of d/c. CSW faxed d/c summary to facility and prepared d/c packet. CSW met with patient who was alert and oriented and agreeable to d/c plan. CSW provided RN with number for room and report. CSW to arrange transportation via [facility]. No further needs. CSW signing off.  [place], [place] Weekend Clinical Social Worker [phone]

## 2013-12-17 NOTE — Discharge Summary (Addendum)
Physician Discharge Summary  [name] BDZ:[id number] DOB: 16-Jul-1934 DOA: 12/15/2013  PCP: [name], MD  Admit date: 12/15/2013 Discharge date: 12/17/2013  Time spent: 45 minutes  Recommendations for Outpatient Follow-up:  Patient will be discharged to [facility] facility. Patient to continue physical and occupational therapy as recommended by the facility. Patient to continue to follow a heart healthy/carb modified diet. Patient should continue his medications as prescribed. Patient should follow-up with his primary care physician within one week of discharge. Patient should also follow-up with ENT.  Discharge Diagnoses:  Dizziness/possible Mnire's disease Hypertension Diabetes mellitus, type II History of coronary artery disease  Hypokalemia  Hyperlipidemia  Discharge Condition: stable  Diet recommendation:  Heart healthy/carb modified  Filed Weights   12/15/13 2100 12/16/13 0617 12/17/13 0524  Weight: 102.83 kg (226 lb 11.2 oz) 101.515 kg (223 lb 12.8 oz) 103.738 kg (228 lb 11.2 oz)    History of present illness:  on 12/15/2013 by Dr. Elisabeth Cara is a 79 y.o. male with PMH of HTN, HPL, DM, CAD, BL mild hearing loss presented with sudden [place] of dizziness, lightheadedness upon standing noted 5 AM today. Patient reports dizziness, but no vertigo says "like I am going to pass out"; Pt denies any focal weakness, or paraesthesia; He had some nausea, but no vomiting, no chest pain, no cough, no SOB; no fever, chills.  Hospital Course:  Dizziness/ possible Mnire's disease -Patient does have history of bilateral hearing loss as well as tinnitus -Neuro exam is nonfocal, Dix-Hallpike maneuver was also negative -Orthostatic vitals were negative -spoke with neurology via phone, recommended starting patient on low dose Valium and obtaining CTA of the head and neck -CTA: no areas of significant extracranial or intracranial irregularity or flow-limiting  stenosis, no abnormal postcontrast enhancement -PT/OT recommended SNF with vestibular therapy -Patient will need to follow up with ENT as an outpatient for further workup, this was discussed with the patient and his sister -patient will be discharged to skilled nursing facility.  Hypertension -Continue amlodipine, hydralazine, lisinopril, metoprolol  Diabetes mellitus, type II -Continue ISS  -HbA1c 7.5 (11/10/2013)  History of coronary artery disease -Patient currently denies any chest pain -Continue home medications including aspirin, beta blocker, statin  Hypokalemia -Likely secondary to diuretics -Patient is to continue potassium supplementation  Hyperlipidemia -Continue statin  Procedures: None  Consultations: Neurology, via phone  Discharge Exam: Filed Vitals:   12/17/13 0830  BP: 142/80  Pulse: 74  Temp: 98 F (36.7 C)  Resp: 18     General: Well developed, well nourished, NAD, appears stated age  HEENT: NCAT, mucous membranes moist.  Cardiovascular: S1 S2 auscultated, no rubs, murmurs or gallops. Regular rate and rhythm.  Respiratory: Clear to auscultation bilaterally with equal chest rise  Abdomen: Soft, obese, nontender, nondistended, + bowel sounds  Extremities: warm dry without cyanosis clubbing or edema  Neuro: AAOx3, no new focal deficits, decreased hearing in the R>L ear  Skin: Without rashes exudates or nodules  Psych: Appropriate mood and affect  Discharge Instructions      Discharge Instructions    Discharge instructions    Complete by:  As directed   Patient will be discharged to [facility]. Patient to continue physical and occupational therapy as recommended by the facility. Patient to continue to follow a heart healthy/carb modified diet. Patient should continue his medications as prescribed. Patient should follow-up with his primary care physician within one week of discharge. Patient should also follow-up with  ENT.  Medication List    TAKE these medications        amLODipine 10 MG tablet  Commonly known as:  NORVASC  Take 1 tablet (10 mg total) by mouth every morning.     aspirin EC 81 MG tablet  Take 81 mg by mouth daily.     diazepam 5 MG tablet  Commonly known as:  VALIUM  Take 0.5 tablets (2.5 mg total) by mouth 2 (two) times daily.     glimepiride 4 MG tablet  Commonly known as:  AMARYL  Take 1 tablet (4 mg total) by mouth daily with breakfast.     hydrALAZINE 25 MG tablet  Commonly known as:  APRESOLINE  TAKE 1 TABLET BY MOUTH 2 TIMES A DAY.     lisinopril-hydrochlorothiazide 20-25 MG per tablet  Commonly known as:  PRINZIDE,ZESTORETIC  Take 1 tablet by mouth daily.     lisinopril-hydrochlorothiazide 20-25 MG per tablet  Commonly known as:  PRINZIDE,ZESTORETIC  TAKE 1 TABLET BY MOUTH EVERY MORNING.     metFORMIN 1000 MG tablet  Commonly known as:  GLUCOPHAGE  Take 1 tablet (1,000 mg total) by mouth 2 (two) times daily with a meal.  Start taking on:  12/19/2013     metoprolol 50 MG tablet  Commonly known as:  LOPRESSOR  Take 1 tablet (50 mg total) by mouth 2 (two) times daily.     potassium chloride SA 20 MEQ tablet  Commonly known as:  KLOR-CON M20  Take one by mouth daily.     pravastatin 40 MG tablet  Commonly known as:  PRAVACHOL  Take 1 tablet (40 mg total) by mouth at bedtime.     tamsulosin 0.4 MG Caps capsule  Commonly known as:  FLOMAX  Take 1 capsule (0.4 mg total) by mouth at bedtime.       Allergies  Allergen Reactions  . Codeine Phosphate Nausea Only    Can take with food and usually doesn't cause nausea  . Morphine Nausea Only    Can take with food and usually doesn't cause nausea   Follow-up Information    Follow up with [name], MD. Schedule an appointment as soon as possible for a visit in 1 week.   Specialty:  Family Medicine   Why:  hospital follow-up   Contact information:   [place]  [place] [address] [phone]       Follow up with SHOEMAKER, DAVID, MD. Schedule an appointment as soon as possible for a visit in 2 weeks.   Specialty:  Otolaryngology   Contact information:   [address] [place] [address] [phone]        The results of significant diagnostics from this hospitalization (including imaging, microbiology, ancillary and laboratory) are listed below for reference.    Significant Diagnostic Studies: Ct Angio Head W/cm &/or Wo Cm  12/16/2013   CLINICAL DATA:  79 year old male with hearing loss presenting with sudden onset of dizziness and vertigo beginning 12/15/2013. MRI negative for acute stroke.  EXAM: CT ANGIOGRAPHY HEAD AND NECK  TECHNIQUE: Multidetector CT imaging of the head and neck was performed using the standard protocol during bolus administration of intravenous contrast. Multiplanar CT image reconstructions and MIPs were obtained to evaluate the vascular anatomy. Carotid stenosis measurements (when applicable) are obtained utilizing NASCET criteria, using the distal internal carotid diameter as the denominator.  CONTRAST:  4mL OMNIPAQUE IOHEXOL 350 MG/ML SOLN  COMPARISON:  MRI brain 12/15/2013.  FINDINGS:  CTA HEAD FINDINGS  No evidence for acute infarction, hemorrhage, mass lesion, hydrocephalus, or extra-axial fluid. Mild cerebral and cerebellar atrophy. No developing cortical hypodensity to suggest interval cerebral infarct. Post infusion, no abnormal enhancement. Major dural venous sinuses patent.  The skull base, cavernous, and supraclinoid internal carotid arteries are widely patent. The RIGHT internal carotid artery is larger than the LEFT, due to a dominant A1 ACA on the RIGHT.  Vertebral arteries contribute roughly equally to the formation of basilar. No flow-limiting posterior circulation stenosis. No intracranial aneurysm.  Review of the MIP images confirms the above findings.  CTA NECK FINDINGS  Transverse arch atheromatous  change. Conventional branching of the great vessels without proximal stenosis. No ostial lesions. Non stenotic atheromatous change both carotid bifurcations. No dissection or fibromuscular disease. Codominant vertebral arteries. Cervical spondylosis. Moderately prominent C2-C3 ossification posterior longitudinal ligament. No neck masses. Airway midline. No sinus or mastoid disease.  Review of the MIP images confirms the above findings.  IMPRESSION: No areas of significant extracranial or intracranial irregularity or flow-limiting stenosis.  No abnormal postcontrast enhancement.   Electronically Signed   By: Rolla Flatten M.D.   On: 12/16/2013 16:11   Ct Angio Neck W/cm &/or Wo/cm  12/16/2013   CLINICAL DATA:  79 year old male with hearing loss presenting with sudden onset of dizziness and vertigo beginning 12/15/2013. MRI negative for acute stroke.  EXAM: CT ANGIOGRAPHY HEAD AND NECK  TECHNIQUE: Multidetector CT imaging of the head and neck was performed using the standard protocol during bolus administration of intravenous contrast. Multiplanar CT image reconstructions and MIPs were obtained to evaluate the vascular anatomy. Carotid stenosis measurements (when applicable) are obtained utilizing NASCET criteria, using the distal internal carotid diameter as the denominator.  CONTRAST:  42mL OMNIPAQUE IOHEXOL 350 MG/ML SOLN  COMPARISON:  MRI brain 12/15/2013.  FINDINGS: CTA HEAD FINDINGS  No evidence for acute infarction, hemorrhage, mass lesion, hydrocephalus, or extra-axial fluid. Mild cerebral and cerebellar atrophy. No developing cortical hypodensity to suggest interval cerebral infarct. Post infusion, no abnormal enhancement. Major dural venous sinuses patent.  The skull base, cavernous, and supraclinoid internal carotid arteries are widely patent. The RIGHT internal carotid artery is larger than the LEFT, due to a dominant A1 ACA on the RIGHT.  Vertebral arteries contribute roughly equally to the formation of  basilar. No flow-limiting posterior circulation stenosis. No intracranial aneurysm.  Review of the MIP images confirms the above findings.  CTA NECK FINDINGS  Transverse arch atheromatous change. Conventional branching of the great vessels without proximal stenosis. No ostial lesions. Non stenotic atheromatous change both carotid bifurcations. No dissection or fibromuscular disease. Codominant vertebral arteries. Cervical spondylosis. Moderately prominent C2-C3 ossification posterior longitudinal ligament. No neck masses. Airway midline. No sinus or mastoid disease.  Review of the MIP images confirms the above findings.  IMPRESSION: No areas of significant extracranial or intracranial irregularity or flow-limiting stenosis.  No abnormal postcontrast enhancement.   Electronically Signed   By: Rolla Flatten M.D.   On: 12/16/2013 16:11   Mr Brain Wo Contrast  12/15/2013   CLINICAL DATA:  Initial evaluation for sudden onset dizziness, lightheadedness. Inability to walk.  EXAM: MRI HEAD WITHOUT CONTRAST  TECHNIQUE: Multiplanar, multiecho pulse sequences of the brain and surrounding structures were obtained without intravenous contrast.  COMPARISON:  None available.  FINDINGS: Study is somewhat degraded by motion artifact.  Diffuse prominence of the CSF containing spaces is compatible with generalized cerebral atrophy, moderate for patient age. No significant white matter changes present. No  mass lesion, mass effect, or midline shift. Ventricles are normal in size without evidence of hydrocephalus. No extra-axial fluid collection.  No abnormal foci of restricted diffusion to suggest acute intracranial infarct. Gray-white matter differentiation maintained. Normal flow voids seen within the intracranial vasculature.  Craniocervical junction is normal. Pituitary gland within normal limits.  No acute abnormality seen about the orbits.  Paranasal sinuses are clear. Scattered fluid signal intensity present within the right  mastoid air cells. Left mastoid air cells are clear.  Bone marrow signal intensity is normal. Scalp soft tissues within normal limits.  IMPRESSION: 1. No acute intracranial infarct or other abnormality identified. 2. Moderate generalized cerebral atrophy.   Electronically Signed   By: Jeannine Boga M.D.   On: 12/15/2013 22:07   Dg Chest Portable 1 View  12/15/2013   CLINICAL DATA:  "Lightheaded" , acute onset. Current history of hypertension, diabetes and coronary artery disease with prior MI.  EXAM: PORTABLE CHEST - 1 VIEW  COMPARISON:  Two-view chest x-ray 04/14/2013, 04/17/2011. Portable chest x-rays 04/08/2013, 02/18/2012.  FINDINGS: Markedly suboptimal inspiration which accounts for atelectasis in the lung bases and crowded bronchovascular markings diffusely. This also accentuates the heart size which is likely mildly enlarged but stable. Thoracic aorta mildly tortuous and atherosclerotic, unchanged. Lungs otherwise clear. No localized airspace consolidation. No pleural effusions. No pneumothorax. Normal pulmonary vascularity.  IMPRESSION: Suboptimal inspiration accounts for bibasilar atelectasis. No acute cardiopulmonary disease otherwise. Stable mild cardiomegaly without pulmonary edema.   Electronically Signed   By: Evangeline Dakin M.D.   On: 12/15/2013 09:09    Microbiology: No results found for this or any previous visit (from the past 240 hour(s)).   Labs: Basic Metabolic Panel:  Recent Labs Lab 12/15/13 0849 12/15/13 0926 12/17/13 0355  NA 140 137 141  K 4.0 3.6* 3.5*  CL 98 96 100  CO2 26  --  24  GLUCOSE 240* 240* 180*  BUN 18 18 19   CREATININE 0.93 1.00 1.11  CALCIUM 9.7  --  9.4   Liver Function Tests:  Recent Labs Lab 12/15/13 0849  AST 18  ALT 31  ALKPHOS 67  BILITOT 0.5  PROT 6.9  ALBUMIN 4.0   No results for input(s): LIPASE, AMYLASE in the last 168 hours. No results for input(s): AMMONIA in the last 168 hours. CBC:  Recent Labs Lab  12/15/13 0849 12/15/13 0926  WBC 7.0  --   NEUTROABS 5.0  --   HGB 15.7 16.3  HCT 44.5 48.0  MCV 92.5  --   PLT 272  --    Cardiac Enzymes: No results for input(s): CKTOTAL, CKMB, CKMBINDEX, TROPONINI in the last 168 hours. BNP: BNP (last 3 results) No results for input(s): PROBNP in the last 8760 hours. CBG:  Recent Labs Lab 12/16/13 0730 12/16/13 1138 12/16/13 1658 12/16/13 2119 12/17/13 0745  GLUCAP 175* 260* 220* 264* 198*       Signed:  Shea Kapur  [facility] 12/17/2013, 9:28 AM

## 2013-12-17 NOTE — Plan of Care (Signed)
Problem: Discharge Progression Outcomes Goal: No anginal pain Outcome: Completed/Met Date Met:  12/17/13 Goal: Hemodynamically stable Outcome: Completed/Met Date Met:  89/02/28 Goal: Complications resolved/controlled Outcome: Completed/Met Date Met:  12/17/13 Goal: Barriers To Progression Addressed/Resolved Outcome: Completed/Met Date Met:  12/17/13 Goal: Discharge plan in place and appropriate Outcome: Completed/Met Date Met:  12/17/13 Goal: Vascular site scale level 0 - I Vascular Site Scale Level 0: No bruising/bleeding/hematoma Level I (Mild): Bruising/Ecchymosis, minimal bleeding/ooozing, palpable hematoma < 3 cm Level II (Moderate): Bleeding not affecting hemodynamic parameters, pseudoaneurysm, palpable hematoma > 3 cm Level III (Severe) Bleeding which affects hemodynamic parameters or retroperitoneal hemorrhage  Outcome: Not Applicable Date Met:  40/69/86 Goal: Tolerates diet Outcome: Completed/Met Date Met:  12/17/13 Goal: Activity appropriate for discharge plan Outcome: Completed/Met Date Met:  12/17/13 Goal: Other Discharge Outcomes/Goals Outcome: Completed/Met Date Met:  12/17/13

## 2013-12-17 NOTE — Progress Notes (Signed)
UR completed 

## 2013-12-17 NOTE — Plan of Care (Signed)
Problem: Acute Rehab PT Goals(only PT should resolve) Goal: Pt Will Go Supine/Side To Sit Outcome: Progressing Goal: Patient Will Transfer Sit To/From Stand Outcome: Progressing Goal: Pt Will Ambulate Outcome: Progressing Goal: Pt/caregiver will Perform Home Exercise Program Outcome: Completed/Met Date Met:  12/17/13

## 2013-12-17 NOTE — Discharge Instructions (Signed)

## 2014-01-02 ENCOUNTER — Emergency Department (HOSPITAL_COMMUNITY): Payer: Medicare Other

## 2014-01-02 ENCOUNTER — Encounter (HOSPITAL_COMMUNITY): Payer: Self-pay | Admitting: *Deleted

## 2014-01-02 ENCOUNTER — Inpatient Hospital Stay (HOSPITAL_COMMUNITY)
Admission: EM | Admit: 2014-01-02 | Discharge: 2014-01-06 | DRG: 872 | Disposition: A | Discharge status: Other Acute Inpatient Hospital | Payer: Medicare Other | Attending: Internal Medicine | Admitting: Internal Medicine

## 2014-01-02 ENCOUNTER — Other Ambulatory Visit: Payer: Self-pay

## 2014-01-02 DIAGNOSIS — I152 Hypertension secondary to endocrine disorders: Secondary | ICD-10-CM | POA: Diagnosis present

## 2014-01-02 DIAGNOSIS — E1165 Type 2 diabetes mellitus with hyperglycemia: Secondary | ICD-10-CM | POA: Diagnosis present

## 2014-01-02 DIAGNOSIS — I2510 Atherosclerotic heart disease of native coronary artery without angina pectoris: Secondary | ICD-10-CM | POA: Diagnosis present

## 2014-01-02 DIAGNOSIS — F419 Anxiety disorder, unspecified: Secondary | ICD-10-CM | POA: Diagnosis present

## 2014-01-02 DIAGNOSIS — E872 Acidosis: Secondary | ICD-10-CM | POA: Diagnosis present

## 2014-01-02 DIAGNOSIS — R55 Syncope and collapse: Secondary | ICD-10-CM | POA: Diagnosis not present

## 2014-01-02 DIAGNOSIS — E785 Hyperlipidemia, unspecified: Secondary | ICD-10-CM | POA: Diagnosis present

## 2014-01-02 DIAGNOSIS — I5022 Chronic systolic (congestive) heart failure: Secondary | ICD-10-CM | POA: Diagnosis present

## 2014-01-02 DIAGNOSIS — A419 Sepsis, unspecified organism: Principal | ICD-10-CM | POA: Diagnosis present

## 2014-01-02 DIAGNOSIS — N390 Urinary tract infection, site not specified: Secondary | ICD-10-CM

## 2014-01-02 DIAGNOSIS — Z79899 Other long term (current) drug therapy: Secondary | ICD-10-CM

## 2014-01-02 DIAGNOSIS — M549 Dorsalgia, unspecified: Secondary | ICD-10-CM

## 2014-01-02 DIAGNOSIS — R32 Unspecified urinary incontinence: Secondary | ICD-10-CM | POA: Diagnosis present

## 2014-01-02 DIAGNOSIS — E1159 Type 2 diabetes mellitus with other circulatory complications: Secondary | ICD-10-CM | POA: Diagnosis present

## 2014-01-02 DIAGNOSIS — Z794 Long term (current) use of insulin: Secondary | ICD-10-CM

## 2014-01-02 DIAGNOSIS — R259 Unspecified abnormal involuntary movements: Secondary | ICD-10-CM | POA: Diagnosis present

## 2014-01-02 DIAGNOSIS — I429 Cardiomyopathy, unspecified: Secondary | ICD-10-CM | POA: Diagnosis present

## 2014-01-02 DIAGNOSIS — I252 Old myocardial infarction: Secondary | ICD-10-CM

## 2014-01-02 DIAGNOSIS — R569 Unspecified convulsions: Secondary | ICD-10-CM | POA: Diagnosis present

## 2014-01-02 DIAGNOSIS — E1151 Type 2 diabetes mellitus with diabetic peripheral angiopathy without gangrene: Secondary | ICD-10-CM | POA: Diagnosis present

## 2014-01-02 DIAGNOSIS — F411 Generalized anxiety disorder: Secondary | ICD-10-CM | POA: Diagnosis present

## 2014-01-02 DIAGNOSIS — Z7982 Long term (current) use of aspirin: Secondary | ICD-10-CM

## 2014-01-02 DIAGNOSIS — G629 Polyneuropathy, unspecified: Secondary | ICD-10-CM | POA: Diagnosis present

## 2014-01-02 DIAGNOSIS — I10 Essential (primary) hypertension: Secondary | ICD-10-CM | POA: Diagnosis present

## 2014-01-02 LAB — URINALYSIS, ROUTINE W REFLEX MICROSCOPIC
Bilirubin Urine: NEGATIVE
Glucose, UA: 100 mg/dL — AB
Hgb urine dipstick: NEGATIVE
Ketones, ur: NEGATIVE mg/dL
NITRITE: POSITIVE — AB
PH: 6.5 (ref 5.0–8.0)
Protein, ur: 30 mg/dL — AB
SPECIFIC GRAVITY, URINE: 1.01 (ref 1.005–1.030)
UROBILINOGEN UA: 1 mg/dL (ref 0.0–1.0)

## 2014-01-02 LAB — COMPREHENSIVE METABOLIC PANEL
ALK PHOS: 66 U/L (ref 39–117)
ALT: 34 U/L (ref 0–53)
AST: 22 U/L (ref 0–37)
Albumin: 3.9 g/dL (ref 3.5–5.2)
Anion gap: 19 — ABNORMAL HIGH (ref 5–15)
BUN: 17 mg/dL (ref 6–23)
CO2: 22 mEq/L (ref 19–32)
CREATININE: 1.13 mg/dL (ref 0.50–1.35)
Calcium: 9.7 mg/dL (ref 8.4–10.5)
Chloride: 100 mEq/L (ref 96–112)
GFR calc non Af Amer: 61 mL/min — ABNORMAL LOW (ref >90)
GFR, EST AFRICAN AMERICAN: 70 mL/min — AB (ref >90)
GLUCOSE: 247 mg/dL — AB (ref 70–99)
POTASSIUM: 3.7 meq/L (ref 3.7–5.3)
Sodium: 141 mEq/L (ref 137–147)
Total Bilirubin: 0.5 mg/dL (ref 0.3–1.2)
Total Protein: 6.9 g/dL (ref 6.0–8.3)

## 2014-01-02 LAB — CBC WITH DIFFERENTIAL/PLATELET
BASOS ABS: 0 10*3/uL (ref 0.0–0.1)
BASOS PCT: 1 % (ref 0–1)
Eosinophils Absolute: 0.2 10*3/uL (ref 0.0–0.7)
Eosinophils Relative: 3 % (ref 0–5)
HCT: 45.4 % (ref 39.0–52.0)
Hemoglobin: 15.5 g/dL (ref 13.0–17.0)
LYMPHS PCT: 20 % (ref 12–46)
Lymphs Abs: 1.3 10*3/uL (ref 0.7–4.0)
MCH: 32.3 pg (ref 26.0–34.0)
MCHC: 34.1 g/dL (ref 30.0–36.0)
MCV: 94.6 fL (ref 78.0–100.0)
Monocytes Absolute: 0.7 10*3/uL (ref 0.1–1.0)
Monocytes Relative: 11 % (ref 3–12)
NEUTROS ABS: 4.3 10*3/uL (ref 1.7–7.7)
NEUTROS PCT: 67 % (ref 43–77)
Platelets: 297 10*3/uL (ref 150–400)
RBC: 4.8 MIL/uL (ref 4.22–5.81)
RDW: 13.5 % (ref 11.5–15.5)
WBC: 6.4 10*3/uL (ref 4.0–10.5)

## 2014-01-02 LAB — GLUCOSE, CAPILLARY
Glucose-Capillary: 123 mg/dL — ABNORMAL HIGH (ref 70–99)
Glucose-Capillary: 168 mg/dL — ABNORMAL HIGH (ref 70–99)

## 2014-01-02 LAB — URINE MICROSCOPIC-ADD ON

## 2014-01-02 LAB — I-STAT CG4 LACTIC ACID, ED: LACTIC ACID, VENOUS: 3.32 mmol/L — AB (ref 0.5–2.2)

## 2014-01-02 LAB — MRSA PCR SCREENING: MRSA by PCR: NEGATIVE

## 2014-01-02 MED ORDER — ASPIRIN EC 81 MG PO TBEC
81.0000 mg | DELAYED_RELEASE_TABLET | Freq: Every day | ORAL | Status: DC
  Administered 2014-01-03: 81 mg via ORAL
  Filled 2014-01-02: qty 1

## 2014-01-02 MED ORDER — ENOXAPARIN SODIUM 40 MG/0.4ML ~~LOC~~ SOLN
40.0000 mg | SUBCUTANEOUS | Status: DC
  Administered 2014-01-02 – 2014-01-05 (×4): 40 mg via SUBCUTANEOUS
  Filled 2014-01-02 (×5): qty 0.4

## 2014-01-02 MED ORDER — ACETAMINOPHEN 325 MG PO TABS
650.0000 mg | ORAL_TABLET | Freq: Once | ORAL | Status: AC
Start: 2014-01-02 — End: 2014-01-02
  Administered 2014-01-02: 650 mg via ORAL
  Filled 2014-01-02: qty 2

## 2014-01-02 MED ORDER — ACETAMINOPHEN 650 MG RE SUPP: 650.0000 mg | Freq: Four times a day (QID) | RECTAL | Status: DC | PRN

## 2014-01-02 MED ORDER — LORAZEPAM 2 MG/ML IJ SOLN
1.0000 mg | Freq: Once | INTRAMUSCULAR | Status: AC
  Administered 2014-01-02: 1 mg via INTRAVENOUS
  Filled 2014-01-02: qty 1

## 2014-01-02 MED ORDER — DIAZEPAM 5 MG PO TABS
2.5000 mg | ORAL_TABLET | Freq: Two times a day (BID) | ORAL | Status: DC
  Administered 2014-01-02: 2.5 mg via ORAL
  Filled 2014-01-02: qty 1

## 2014-01-02 MED ORDER — DEXTROSE 5 % IV SOLN
1.0000 g | Freq: Once | INTRAVENOUS | Status: AC
  Administered 2014-01-02: 1 g via INTRAVENOUS
  Filled 2014-01-02: qty 10

## 2014-01-02 MED ORDER — CETYLPYRIDINIUM CHLORIDE 0.05 % MT LIQD
7.0000 mL | Freq: Two times a day (BID) | OROMUCOSAL | Status: DC
  Administered 2014-01-02 – 2014-01-06 (×7): 7 mL via OROMUCOSAL

## 2014-01-02 MED ORDER — ACETAMINOPHEN 325 MG PO TABS: 650.0000 mg | ORAL_TABLET | Freq: Four times a day (QID) | ORAL | Status: DC | PRN

## 2014-01-02 MED ORDER — LISINOPRIL 20 MG PO TABS
20.0000 mg | ORAL_TABLET | Freq: Every day | ORAL | Status: DC
  Administered 2014-01-02: 20 mg via ORAL
  Filled 2014-01-02 (×2): qty 1

## 2014-01-02 MED ORDER — INSULIN GLARGINE 300 UNIT/ML ~~LOC~~ SOPN: 20.0000 [IU] | PEN_INJECTOR | Freq: Every day | SUBCUTANEOUS | Status: DC

## 2014-01-02 MED ORDER — HYDROCHLOROTHIAZIDE 25 MG PO TABS
25.0000 mg | ORAL_TABLET | Freq: Every day | ORAL | Status: DC
  Administered 2014-01-02: 25 mg via ORAL
  Filled 2014-01-02 (×2): qty 1

## 2014-01-02 MED ORDER — HYDRALAZINE HCL 50 MG PO TABS
50.0000 mg | ORAL_TABLET | Freq: Three times a day (TID) | ORAL | Status: DC
  Administered 2014-01-02: 50 mg via ORAL
  Filled 2014-01-02 (×4): qty 1

## 2014-01-02 MED ORDER — INSULIN ASPART 100 UNIT/ML ~~LOC~~ SOLN: 0.0000 [IU] | Freq: Every day | SUBCUTANEOUS | Status: DC

## 2014-01-02 MED ORDER — PRAVASTATIN SODIUM 40 MG PO TABS
40.0000 mg | ORAL_TABLET | Freq: Every day | ORAL | Status: DC
  Administered 2014-01-02: 40 mg via ORAL
  Filled 2014-01-02 (×2): qty 1

## 2014-01-02 MED ORDER — CEFTRIAXONE SODIUM IN DEXTROSE 20 MG/ML IV SOLN
1.0000 g | INTRAVENOUS | Status: DC
  Administered 2014-01-03 – 2014-01-06 (×4): 1 g via INTRAVENOUS
  Filled 2014-01-02 (×4): qty 50

## 2014-01-02 MED ORDER — INSULIN GLARGINE 100 UNIT/ML ~~LOC~~ SOLN
20.0000 [IU] | Freq: Every day | SUBCUTANEOUS | Status: DC
  Administered 2014-01-02 – 2014-01-05 (×4): 20 [IU] via SUBCUTANEOUS
  Filled 2014-01-02 (×5): qty 0.2

## 2014-01-02 MED ORDER — ONDANSETRON HCL 4 MG PO TABS: 4.0000 mg | ORAL_TABLET | Freq: Four times a day (QID) | ORAL | Status: DC | PRN

## 2014-01-02 MED ORDER — METOPROLOL TARTRATE 50 MG PO TABS
50.0000 mg | ORAL_TABLET | Freq: Two times a day (BID) | ORAL | Status: DC
  Administered 2014-01-02: 50 mg via ORAL
  Filled 2014-01-02 (×3): qty 1

## 2014-01-02 MED ORDER — SODIUM CHLORIDE 0.9 % IV BOLUS (SEPSIS)
1000.0000 mL | Freq: Once | INTRAVENOUS | Status: AC
  Administered 2014-01-02: 1000 mL via INTRAVENOUS

## 2014-01-02 MED ORDER — IOHEXOL 350 MG/ML SOLN
100.0000 mL | Freq: Once | INTRAVENOUS | Status: AC | PRN
  Administered 2014-01-02: 100 mL via INTRAVENOUS

## 2014-01-02 MED ORDER — ALBUTEROL SULFATE (2.5 MG/3ML) 0.083% IN NEBU: 2.5000 mg | INHALATION_SOLUTION | RESPIRATORY_TRACT | Status: DC | PRN

## 2014-01-02 MED ORDER — LISINOPRIL-HYDROCHLOROTHIAZIDE 20-25 MG PO TABS: 1.0000 | ORAL_TABLET | Freq: Every day | ORAL | Status: DC

## 2014-01-02 MED ORDER — SODIUM CHLORIDE 0.9 % IV SOLN
INTRAVENOUS | Status: AC
  Administered 2014-01-02 – 2014-01-03 (×2): via INTRAVENOUS

## 2014-01-02 MED ORDER — ONDANSETRON HCL 4 MG/2ML IJ SOLN: 4.0000 mg | Freq: Four times a day (QID) | INTRAMUSCULAR | Status: DC | PRN

## 2014-01-02 MED ORDER — SODIUM CHLORIDE 0.9 % IJ SOLN
3.0000 mL | Freq: Two times a day (BID) | INTRAMUSCULAR | Status: DC
  Administered 2014-01-03 – 2014-01-05 (×3): 3 mL via INTRAVENOUS

## 2014-01-02 MED ORDER — INSULIN ASPART 100 UNIT/ML ~~LOC~~ SOLN
0.0000 [IU] | Freq: Three times a day (TID) | SUBCUTANEOUS | Status: DC
  Administered 2014-01-03: 1 [IU] via SUBCUTANEOUS
  Administered 2014-01-03: 5 [IU] via SUBCUTANEOUS
  Administered 2014-01-04: 2 [IU] via SUBCUTANEOUS
  Administered 2014-01-04 (×2): 3 [IU] via SUBCUTANEOUS
  Administered 2014-01-05 – 2014-01-06 (×4): 2 [IU] via SUBCUTANEOUS

## 2014-01-02 MED ORDER — AMLODIPINE BESYLATE 10 MG PO TABS
10.0000 mg | ORAL_TABLET | Freq: Every morning | ORAL | Status: DC
  Administered 2014-01-02: 10 mg via ORAL
  Filled 2014-01-02 (×2): qty 1

## 2014-01-02 NOTE — Consult Note (Signed)
NEURO HOSPITALIST CONSULT NOTE    Reason for Consult: syncope  HPI:                                                                                                                                          [name] is an 79 y.o. male who was recently admitted to the hospital for dizziness.  On that hospital stay he had a full work up including MRI of brain, CTA head and neck which showed no neurological etiology for dizziness. He was diagnosed with  Dizziness/possible Mnire's disease.  Patient went to a ENT as out patient prior to hospitalization who Diagnosed him with vertigo.  Today patient was washing his face and turned around to get the towel when he suddenly lost his balance and fell .  HE does not recall hitting the floor but also is a poor historian.  No persons were present at time of fall. Later he was noted by nursing staff to have full shaking but was alert during this episode.  While talking with wife she states he was unable to hold a conversation.  He does not recall event. He often gets sensation of dizziness when standing up but if he sits at edge of bed the dizziness will go away. Currently he is back to baseline.   Past Medical History  Diagnosis Date  . CAD (coronary artery disease)   . History of syncope   . HTN (hypertension)   . Borderline hyperlipidemia   . History of gastritis   . Diverticulosis of colon   . Colon polyps   . Hypertrophy of prostate with urinary obstruction and other lower urinary tract symptoms (LUTS)   . History of pyelonephritis   . DJD (degenerative joint disease)   . Anxiety   . DM (diabetes mellitus)     Adult onset  . 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 History of kidney stones     Past Surgical History  Procedure Laterality Date  . Appendectomy    . Cataract extraction      RIGHT EYE  . Knee surgery      bilateral ARTHROSCOPY X2 TO EACH  KNEE  . Shoulder surgery  04/2005    left by Dr. Berenice Primas  . 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 S/p cysto & stents for kidnedy stone 11/08 by dr. Reece Agar    . Cystoscopy  04/17/11    stent placed  . Cystoscopy w/ ureteral stent placement  04/17/2011    Procedure: CYSTOSCOPY WITH RETROGRADE PYELOGRAM/URETERAL STENT PLACEMENT;  Surgeon: Hanley Ben, MD;  Location: [facility];  Service: Urology;  Laterality: Left;  . Cystoscopy with retrograde pyelogram, ureteroscopy and stent placement Left 04/18/2013    Procedure: CYSTOSCOPY WITH LEFT RETROGRADE PYELOGRAM, LEFT URETEROSCOPY AND LASER LITHOTRIPSY LEFT STENT PLACEMENT;  Surgeon: Dutch Gray, MD;  Location: [facility];  Service: Urology;  Laterality: Left;  . Holmium laser application Left 07/14/158    Procedure: HOLMIUM LASER APPLICATION;  Surgeon: Dutch Gray, MD;  Location: [facility];  Service: Urology;  Laterality: Left;    Family History  Problem Relation Age of Onset  . Heart attack Mother   . Dementia Brother   . Colon cancer Neg Hx   . Prostate cancer Neg Hx      Social History:  reports that he has never smoked. He has never used smokeless tobacco. He reports that he does not drink alcohol or use illicit drugs.  Allergies  Allergen Reactions  . Codeine Phosphate Nausea Only    Can take with food and usually doesn't cause nausea  . Morphine Nausea Only    Can take with food and usually doesn't cause nausea    MEDICATIONS:                                                                                                                     Prior to Admission:  Prescriptions prior to admission  Medication Sig Dispense Refill Last Dose  . acetaminophen (TYLENOL) 650 MG CR tablet Take 650 mg by mouth every 4 (four) hours as needed for pain or fever.   Past Month at Unknown time  . amLODipine (NORVASC) 10 MG tablet Take 1 tablet (10 mg total) by mouth every morning. 90 tablet 3  01/01/2014 at Unknown time  . aspirin EC 81 MG tablet Take 81 mg by mouth daily.   01/01/2014 at Unknown time  . diazepam (VALIUM) 5 MG tablet Take 0.5 tablets (2.5 mg total) by mouth 2 (two) times daily. 30 tablet 0 01/01/2014 at Unknown time  . glimepiride (AMARYL) 4 MG tablet Take 1 tablet (4 mg total) by mouth daily with breakfast. 90 tablet 3 01/01/2014 at Unknown time  . hydrALAZINE (APRESOLINE) 50 MG tablet Take 50 mg by mouth 3 (three) times daily.   01/01/2014 at Unknown time  . insulin aspart (NOVOLOG) 100 UNIT/ML injection Inject 4-15 Units into the skin 3 (three) times daily before meals.   Past Month at Unknown time  . Insulin Glargine ([name] SOLOSTAR) 300 UNIT/ML SOPN Inject 20 Units into the skin at bedtime.   01/01/2014 at Unknown time  . lisinopril-hydrochlorothiazide (PRINZIDE,ZESTORETIC) 20-25 MG per tablet Take 1 tablet by mouth daily.   01/01/2014 at Unknown time  . metFORMIN (GLUCOPHAGE) 1000 MG tablet Take 1 tablet (1,000 mg total) by mouth 2 (two) times daily with a meal. 180 tablet 3 01/01/2014 at Unknown time  . metoprolol (LOPRESSOR) 50 MG tablet Take 1 tablet (50 mg total) by mouth 2 (two) times daily. 180 tablet 3 01/01/2014 at 9pm  . pravastatin (PRAVACHOL) 40 MG tablet Take 1  tablet (40 mg total) by mouth at bedtime. 90 tablet 3 01/01/2014 at Unknown time   Scheduled: . amLODipine  10 mg Oral q morning - 10a  . [START ON 01/03/2014] aspirin EC  81 mg Oral Daily  . [START ON 01/03/2014] cefTRIAXone (ROCEPHIN)  IV  1 g Intravenous Q24H  . diazepam  2.5 mg Oral BID  . enoxaparin (LOVENOX) injection  40 mg Subcutaneous Q24H  . hydrALAZINE  50 mg Oral TID  . hydrochlorothiazide  25 mg Oral Daily  . insulin glargine  20 Units Subcutaneous QHS  . lisinopril  20 mg Oral Daily  . metoprolol  50 mg Oral BID  . pravastatin  40 mg Oral QHS  . sodium chloride  3 mL Intravenous Q12H     ROS:                                                                                                                                        History obtained from the patient  General ROS: negative for - chills, fatigue, fever, night sweats, weight gain or weight loss Psychological ROS: negative for - behavioral disorder, hallucinations, memory difficulties, mood swings or suicidal ideation Ophthalmic ROS: negative for - blurry vision, double vision, eye pain or loss of vision ENT ROS: negative for - epistaxis, nasal discharge, oral lesions, sore throat, tinnitus or vertigo Allergy and Immunology ROS: negative for - hives or itchy/watery eyes Hematological and Lymphatic ROS: negative for - bleeding problems, bruising or swollen lymph nodes Endocrine ROS: negative for - galactorrhea, hair pattern changes, polydipsia/polyuria or temperature intolerance Respiratory ROS: negative for - cough, hemoptysis, shortness of breath or wheezing Cardiovascular ROS: negative for - chest pain, dyspnea on exertion, edema or irregular heartbeat Gastrointestinal ROS: negative for - abdominal pain, diarrhea, hematemesis, nausea/vomiting or stool incontinence Genito-Urinary ROS: negative for - dysuria, hematuria, incontinence or urinary frequency/urgency Musculoskeletal ROS: negative for - joint swelling or muscular weakness Neurological ROS: as noted in HPI Dermatological ROS: negative for rash and skin lesion changes   Blood pressure 163/87, pulse 86, temperature 97.5 F (36.4 C), temperature source Oral, resp. rate 16, SpO2 96 %.   Neurologic Examination:                                                                                                      Physical Exam  Constitutional: He appears well-developed and well-nourished.  Psych: Affect appropriate to situation Eyes: No scleral injection HENT: No OP obstrucion Head: Normocephalic.  Cardiovascular: Normal rate and regular rhythm.  Respiratory: Effort normal and breath sounds normal.  GI: Soft. Bowel sounds are normal. No  distension. There is no tenderness.  Skin: WDI  Neuro Exam: Mental Status: Alert, oriented, thought content appropriate.  Speech fluent without evidence of aphasia.  Able to follow 3 step commands without difficulty. Cranial Nerves: II: Discs flat bilaterally; Visual fields grossly normal, pupils equal, round, reactive to light and accommodation III,IV, VI: ptosis not present, extra-ocular motions intact bilaterally V,VII: smile symmetric, facial light touch sensation normal bilaterally VIII: hearing normal bilaterally IX,X: gag reflex present XI: bilateral shoulder shrug XII: midline tongue extension without atrophy or fasciculations  Motor: Right : Upper extremity   5/5    Left:     Upper extremity   5/5  Lower extremity   5/5     Lower extremity   5/5 Tone and bulk:normal tone throughout; no atrophy noted Sensory: Pinprick and light touch intact, no vibratory sensation in the ankles and decreased proprioception in toes.  Deep Tendon Reflexes:  Right: Upper Extremity   Left: Upper extremity   biceps (C-5 to C-6) 2/4   biceps (C-5 to C-6) 2/4 tricep (C7) 2/4    triceps (C7) 2/4 Brachioradialis (C6) 2/4  Brachioradialis (C6) 2/4  Lower Extremity Lower Extremity  quadriceps (L-2 to L-4) 1/4   quadriceps (L-2 to L-4) 1/4 Achilles (S1) 0/4   Achilles (S1) 0/4  Plantars: Right: downgoing   Left: downgoing Cerebellar: normal finger-to-nose,  normal heel-to-shin test Gait: not tested.  CV: pulses palpable throughout    Lab Results: Basic Metabolic Panel:  Recent Labs Lab 01/02/14 1039  NA 141  K 3.7  CL 100  CO2 22  GLUCOSE 247*  BUN 17  CREATININE 1.13  CALCIUM 9.7    Liver Function Tests:  Recent Labs Lab 01/02/14 1039  AST 22  ALT 34  ALKPHOS 66  BILITOT 0.5  PROT 6.9  ALBUMIN 3.9   No results for input(s): LIPASE, AMYLASE in the last 168 hours. No results for input(s): AMMONIA in the last 168 hours.  CBC:  Recent Labs Lab 01/02/14 1039  WBC 6.4   NEUTROABS 4.3  HGB 15.5  HCT 45.4  MCV 94.6  PLT 297    Cardiac Enzymes: No results for input(s): CKTOTAL, CKMB, CKMBINDEX, TROPONINI in the last 168 hours.  Lipid Panel: No results for input(s): CHOL, TRIG, HDL, CHOLHDL, VLDL, LDLCALC in the last 168 hours.  CBG: No results for input(s): GLUCAP in the last 168 hours.  Microbiology: Results for orders placed or performed during the hospital encounter of 04/17/11  Surgical pcr screen     Status: Abnormal   Collection Time: 04/17/11 10:58 AM  Result Value Ref Range Status   MRSA, PCR NEGATIVE NEGATIVE Final   Staphylococcus aureus POSITIVE (A) NEGATIVE Final    Comment:        The Xpert SA Assay (FDA approved for NASAL specimens only), is one component of a comprehensive surveillance program.  It is not intended to diagnose infection nor to guide or monitor treatment.    Coagulation Studies: No results for input(s): LABPROT, INR in the last 72 hours.  Imaging: Ct Head Wo Contrast  01/02/2014   CLINICAL DATA:  [place] [address] striking head on towel rack this morning, dizziness before fall, head injury, history coronary artery disease post MI, hypertension, diabetes  EXAM: CT HEAD WITHOUT CONTRAST  TECHNIQUE: Contiguous axial images were obtained from the base of the skull through the vertex  without intravenous contrast.  COMPARISON:  12/16/2013  FINDINGS: Mild atrophy.  Normal ventricular morphology.  No midline shift or mass effect.  Otherwise normal appearance of brain parenchyma.  No intracranial hemorrhage, mass lesion or evidence acute infarction.  No extra-axial fluid collections.  Atherosclerotic calcifications of the vertebral, basilar and internal carotid arteries.  Tiny amount of fluid dependently in RIGHT mastoid air cells, unchanged.  Visualized sinuses clear and bones unremarkable.  IMPRESSION: Mild generalized atrophy.  No acute intracranial abnormalities.  No significant interval change.   Electronically Signed   By: Lavonia Dana M.D.   On: 01/02/2014 11:18   Ct Angio Chest Pe W/cm &/or Wo Cm  01/02/2014   CLINICAL DATA:  Syncopal episode.  Shortness of breath.  Hypoxia.  EXAM: CT ANGIOGRAPHY CHEST WITH CONTRAST  TECHNIQUE: Multidetector CT imaging of the chest was performed using the standard protocol during bolus administration of intravenous contrast. Multiplanar CT image reconstructions and MIPs were obtained to evaluate the vascular anatomy.  CONTRAST:  171mL OMNIPAQUE IOHEXOL 350 MG/ML SOLN  COMPARISON:  None.  FINDINGS: Vascular/Cardiac: Satisfactory opacification of pulmonary arteries is demonstated, and no pulmonary emboli are identified. No evidence of thoracic aortic aneurysm or other significant abnormality. Mild cardiomegaly noted.  Mediastinum/Hilar Regions: No masses or pathologically enlarged lymph nodes identified.  Lungs:  No pulmonary infiltrate or mass identified.  Pleura:  No evidence of effusion or mass.  Musculoskeletal:  No suspicious bone lesions identified.  Other:  None.  Review of the MIP images confirms the above findings.  IMPRESSION: No evidence of pulmonary embolism or other acute findings.  Mild cardiomegaly.   Electronically Signed   By: Earle Gell M.D.   On: 01/02/2014 13:24   Dg Chest Port 1 View  01/02/2014   CLINICAL DATA:  Cough.  EXAM: PORTABLE CHEST - 1 VIEW  COMPARISON:  December 15, 2013.  FINDINGS: The heart size and mediastinal contours are within normal limits. Both lungs are clear. No pneumothorax or pleural effusion is noted. The visualized skeletal structures are unremarkable.  IMPRESSION: No acute cardiopulmonary abnormality seen.   Electronically Signed   By: Sabino Dick M.D.   On: 01/02/2014 11:42       Assessment and plan per attending neurologist  Etta Quill PA-C [facility] [phone]  01/02/2014, 4:13 PM   Assessment/Plan: 79 YO male with ongoing dizziness in the setting of negative MRI and CTA of neck and head. Patient returns to hospital  after episode of dizziness causing fall. In further discussion the overall picture is more indicative of postural hypotension or autonomic dysautonomia. However, due to shaking episodes happened while sitting down and no recollection of event can not rule out seizure.   Recommendation: 1) EEG 2) If EEG negative consider tilt table test and out patient neurological work up for the above mentioned.  3) treat underlying UTI      Patient seen and examined together with physician assistant and I concur with the assessment and plan.  Dorian Pod, MD

## 2014-01-02 NOTE — Progress Notes (Signed)
IL[facility]ON[facility]ANEY [id number] Code Status: FULL Admission Data: 01/02/2014 4:44 PM Attending Provider:  Algis Liming STM:[id number] Damita Dunnings, MD Consults/ Treatment Team:  Neuro  JAYTHEN HAMME is a 79 y.o. male patient admitted from ED awake, alert - oriented  X 3 - no acute distress noted.  VSS - Blood pressure 163/87, pulse 86, temperature 97.5 F (36.4 C), temperature source Oral, resp. rate 16, SpO2 96 %.    IV in place, occlusive dsg intact without redness.  Orientation to room, and floor completed with information packet given to patient/family.  Patient declined safety video at this time.  Admission INP armband ID verified with patient/family, and in place.   SR up x 2, fall assessment complete, with patient and family able to verbalize understanding of risk associated with falls, and verbalized understanding to call nsg before up out of bed.  Call light within reach, patient able to voice, and demonstrate understanding.  Skin excoriations to bilateral lower legs.    Will cont to eval and treat per MD orders.  Delman Cheadle, RN 01/02/2014 4:44 PM

## 2014-01-02 NOTE — ED Notes (Signed)
Family at bedside. Family states while pt was admitted to the ED prior to rehab admission he was experiencing shaking as well with no dx

## 2014-01-02 NOTE — ED Notes (Signed)
To ED via EMS for eval after falling this am while coming out of the bathroom, hitting his head on the towel rack. PTA pt began full body shaking but fully awake. On arrival to ED pt alert and oriented, able to tell this RN hx and what happened this am. MAE x4 freely. Pulses palpable and strong. When pt moved to ED stretcher pts shaking stopped. Shortly after pts arrival, shaking started again. EDP at bedside

## 2014-01-02 NOTE — H&P (Signed)
History and Physical  [name] MWN:[id number] DOB: 08/21/1934 DOA: 01/02/2014  Referring physician: Dr. Sherwood Gambler, EDP PCP: [name], MD  Outpatient Specialists:  1. Urology: Dr. Raynelle Bring 2. ENT; Dr. Wilburn Cornelia.  Chief Complaint: Dizziness, fall, passing out and involuntary shaking.  HPI: [name] is a 79 y.o. male with history of DM 2/IDDM, essential hypertension, CAD, HLD, BPH/UTI/kidney stones/prior urological procedures, syncope, anxiety, vertigo was recently hospitalized 12/15/13-12/17/13 for sudden onset of dizziness, lightheadedness upon standing. He was extensively evaluated including negative orthostatic blood pressures, CTA head and symptoms were felt to be secondary to possible Mnire's disease. During that admission, he had one episode of similar involuntary generalized shaking during OT evaluation. As per neurology recommendations, he was discharged on low-dose Valium and followed up with ENT 3 days back and was told to have vertigo. History is obtained from patient and family at bedside. He apparently did not have any further episodes of dizziness until this morning. He had been discharged to SNF for rehabilitation. While in rehabilitation, this morning he got up and while in the toilet turned around, experienced some dizziness and fell hitting his forehead to the toilet rack and landed on the ground. He denies any chest pain, palpitations or dyspnea. He denies loss of consciousness. He was able to get up by himself. His daughter was informed by the SNF and went by to see him. In her presence, he got up from the bed and returned from the toilet and appeared pale. While sitting on the reclining chair, he started having shaking of all his limbs but was awake and able to answer questions. EMS arrived and patient continued shaking and had an episode of LOC of undetermined duration. No reported tongue biting, frothing, urinary or fecal incontinence. As per family,  the shaking lasted until he came to the ED and was given a dose of Ativan and it's up sided. As per discussion with EDP, patient had a similar shaking episode which was not consistent with chills or seizures and he was awake during the episode. In the ED, patient had acute cardia which improved after 2 L of IV fluids. UA suspicious for urinary tract infection and lactic acid elevated. CT head without acute findings, CTA chest negative for PE or acute findings. Hospitalist admission requested. Patient has a good appetite and no reported nausea, vomiting or diarrhea. As per family, patient had a fever of 101 prior to ED arrival.   Review of Systems: All systems reviewed and apart from history of presenting illness, are negative.  Past Medical History  Diagnosis Date  . CAD (coronary artery disease)   . History of syncope   . HTN (hypertension)   . Borderline hyperlipidemia   . History of gastritis   . Diverticulosis of colon   . Colon polyps   . Hypertrophy of prostate with urinary obstruction and other lower urinary tract symptoms (LUTS)   . History of pyelonephritis   . DJD (degenerative joint disease)   . Anxiety   . DM (diabetes mellitus)     Adult onset  . 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 History of kidney stones    Past Surgical History  Procedure Laterality Date  . Appendectomy    . Cataract extraction      RIGHT EYE  . Knee surgery      bilateral ARTHROSCOPY X2  TO EACH KNEE  . Shoulder surgery  04/2005    left by Dr. Berenice Primas  . 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 S/p cysto & stents for kidnedy stone 11/08 by dr. Reece Agar    . Cystoscopy  04/17/11    stent placed  . Cystoscopy w/ ureteral stent placement  04/17/2011    Procedure: CYSTOSCOPY WITH RETROGRADE PYELOGRAM/URETERAL STENT PLACEMENT;  Surgeon: Hanley Ben, MD;  Location: [facility];   Service: Urology;  Laterality: Left;  . Cystoscopy with retrograde pyelogram, ureteroscopy and stent placement Left 04/18/2013    Procedure: CYSTOSCOPY WITH LEFT RETROGRADE PYELOGRAM, LEFT URETEROSCOPY AND LASER LITHOTRIPSY LEFT STENT PLACEMENT;  Surgeon: Dutch Gray, MD;  Location: [facility];  Service: Urology;  Laterality: Left;  . Holmium laser application Left 8/0/3212    Procedure: HOLMIUM LASER APPLICATION;  Surgeon: Dutch Gray, MD;  Location: [facility];  Service: Urology;  Laterality: Left;   Social History:  reports that he has never smoked. He has never used smokeless tobacco. He reports that he does not drink alcohol or use illicit drugs. Widowed. Usually lives alone. Was recently discharged to SNF for rehabilitation from the hospital. No history of alcohol or tobacco abuse.  Allergies  Allergen Reactions  . Codeine Phosphate Nausea Only    Can take with food and usually doesn't cause nausea  . Morphine Nausea Only    Can take with food and usually doesn't cause nausea    Family History  Problem Relation Age of Onset  . Heart attack Mother   . Dementia Brother   . Colon cancer Neg Hx   . Prostate cancer Neg Hx      Prior to Admission medications   Medication Sig Start Date End Date Taking? Authorizing Provider  acetaminophen (TYLENOL) 650 MG CR tablet Take 650 mg by mouth every 4 (four) hours as needed for pain or fever.   Yes Historical Provider, MD  amLODipine (NORVASC) 10 MG tablet Take 1 tablet (10 mg total) by mouth every morning. 11/10/13  Yes Tonia Ghent, MD  aspirin EC 81 MG tablet Take 81 mg by mouth daily.   Yes Historical Provider, MD  diazepam (VALIUM) 5 MG tablet Take 0.5 tablets (2.5 mg total) by mouth 2 (two) times daily. 12/17/13  Yes Maryann Mikhail, DO  glimepiride (AMARYL) 4 MG tablet Take 1 tablet (4 mg total) by mouth daily with breakfast. 11/10/13  Yes Tonia Ghent, MD  hydrALAZINE (APRESOLINE) 50 MG tablet Take 50 mg by mouth 3 (three) times daily.   Yes  Historical Provider, MD  insulin aspart (NOVOLOG) 100 UNIT/ML injection Inject 4-15 Units into the skin 3 (three) times daily before meals.   Yes Historical Provider, MD  Insulin Glargine ([name] SOLOSTAR) 300 UNIT/ML SOPN Inject 20 Units into the skin at bedtime.   Yes Historical Provider, MD  lisinopril-hydrochlorothiazide (PRINZIDE,ZESTORETIC) 20-25 MG per tablet Take 1 tablet by mouth daily.   Yes Historical Provider, MD  metFORMIN (GLUCOPHAGE) 1000 MG tablet Take 1 tablet (1,000 mg total) by mouth 2 (two) times daily with a meal. 12/19/13  Yes Maryann Mikhail, DO  metoprolol (LOPRESSOR) 50 MG tablet Take 1 tablet (50 mg total) by mouth 2 (two) times daily. 11/10/13  Yes Tonia Ghent, MD  pravastatin (PRAVACHOL) 40 MG tablet Take 1 tablet (40 mg total) by mouth at bedtime. 11/10/13  Yes Tonia Ghent, MD   Physical Exam: Danley Danker Vitals:  01/02/14 1400 01/02/14 1422 01/02/14 1445 01/02/14 1515  BP:      Pulse: 100  93 93  Temp:  98 F (36.7 C)    TempSrc:  Rectal    Resp: 17  15 21   SpO2: 94%  98% 96%   temperature 99.64F, BP 150/82 mmHg   General exam: Moderately built and obese elderly male patient, lying comfortably supine on the gurney in no obvious distress. Does not look septic or toxic.  Head, eyes and ENT: Minimal superficial bruising over the upper mid forehead and normocephalic. Pupils equally reacting to light and accommodation. Oral mucosa moist.  Neck: Supple. No JVD, carotid bruit or thyromegaly.  Lymphatics: No lymphadenopathy.  Respiratory system: Clear to auscultation. No increased work of breathing.  Cardiovascular system: S1 and S2 heard, RRR. No JVD, murmurs, gallops, clicks or pedal edema.  Gastrointestinal system: Abdomen is nondistended, soft and nontender. Normal bowel sounds heard. No organomegaly or masses appreciated.  Central nervous system: Alert and oriented. No focal neurological deficits. Hard of hearing especially from right ear. No involuntary  movements appreciated at this time.   Extremities: Symmetric 5 x 5 power. Peripheral pulses symmetrically felt.   Skin: No rashes or acute findings.  Musculoskeletal system: Negative exam.  Psychiatry: Pleasant and cooperative.   Labs on Admission:  Basic Metabolic Panel:  Recent Labs Lab 01/02/14 1039  NA 141  K 3.7  CL 100  CO2 22  GLUCOSE 247*  BUN 17  CREATININE 1.13  CALCIUM 9.7   Liver Function Tests:  Recent Labs Lab 01/02/14 1039  AST 22  ALT 34  ALKPHOS 66  BILITOT 0.5  PROT 6.9  ALBUMIN 3.9   No results for input(s): LIPASE, AMYLASE in the last 168 hours. No results for input(s): AMMONIA in the last 168 hours. CBC:  Recent Labs Lab 01/02/14 1039  WBC 6.4  NEUTROABS 4.3  HGB 15.5  HCT 45.4  MCV 94.6  PLT 297   Cardiac Enzymes: No results for input(s): CKTOTAL, CKMB, CKMBINDEX, TROPONINI in the last 168 hours.  BNP (last 3 results) No results for input(s): PROBNP in the last 8760 hours. CBG: No results for input(s): GLUCAP in the last 168 hours.  Radiological Exams on Admission: Ct Head Wo Contrast  01/02/2014   CLINICAL DATA:  [place] striking head on towel rack this morning, dizziness before fall, head injury, history coronary artery disease post MI, hypertension, diabetes  EXAM: CT HEAD WITHOUT CONTRAST  TECHNIQUE: Contiguous axial images were obtained from the base of the skull through the vertex without intravenous contrast.  COMPARISON:  12/16/2013  FINDINGS: Mild atrophy.  Normal ventricular morphology.  No midline shift or mass effect.  Otherwise normal appearance of brain parenchyma.  No intracranial hemorrhage, mass lesion or evidence acute infarction.  No extra-axial fluid collections.  Atherosclerotic calcifications of the vertebral, basilar and internal carotid arteries.  Tiny amount of fluid dependently in RIGHT mastoid air cells, unchanged.  Visualized sinuses clear and bones unremarkable.  IMPRESSION: Mild generalized atrophy.  No  acute intracranial abnormalities.  No significant interval change.   Electronically Signed   By: Lavonia Dana M.D.   On: 01/02/2014 11:18   Ct Angio Chest Pe W/cm &/or Wo Cm  01/02/2014   CLINICAL DATA:  Syncopal episode.  Shortness of breath.  Hypoxia.  EXAM: CT ANGIOGRAPHY CHEST WITH CONTRAST  TECHNIQUE: Multidetector CT imaging of the chest was performed using the standard protocol during bolus administration of intravenous contrast. Multiplanar CT image reconstructions  and MIPs were obtained to evaluate the vascular anatomy.  CONTRAST:  14mL OMNIPAQUE IOHEXOL 350 MG/ML SOLN  COMPARISON:  None.  FINDINGS: Vascular/Cardiac: Satisfactory opacification of pulmonary arteries is demonstated, and no pulmonary emboli are identified. No evidence of thoracic aortic aneurysm or other significant abnormality. Mild cardiomegaly noted.  Mediastinum/Hilar Regions: No masses or pathologically enlarged lymph nodes identified.  Lungs:  No pulmonary infiltrate or mass identified.  Pleura:  No evidence of effusion or mass.  Musculoskeletal:  No suspicious bone lesions identified.  Other:  None.  Review of the MIP images confirms the above findings.  IMPRESSION: No evidence of pulmonary embolism or other acute findings.  Mild cardiomegaly.   Electronically Signed   By: Earle Gell M.D.   On: 01/02/2014 13:24   Dg Chest Port 1 View  01/02/2014   CLINICAL DATA:  Cough.  EXAM: PORTABLE CHEST - 1 VIEW  COMPARISON:  December 15, 2013.  FINDINGS: The heart size and mediastinal contours are within normal limits. Both lungs are clear. No pneumothorax or pleural effusion is noted. The visualized skeletal structures are unremarkable.  IMPRESSION: No acute cardiopulmonary abnormality seen.   Electronically Signed   By: Sabino Dick M.D.   On: 01/02/2014 11:42    EKG: Independently reviewed. Sinus tachycardia at 131 bpm, Q waves in inferior leads and no acute findings.   Assessment/Plan Active Problems:   Anxiety state    Essential hypertension   Type 2 diabetes mellitus with vascular disease   UTI (urinary tract infection)   Syncope   Involuntary movements   UTI (lower urinary tract infection)   1. UTI: Treat empirically with IV Rocephin pending culture results. 2. Early sepsis secondary to UTI: As evidenced by temperature of 101F at SNF, tachycardia on arrival and elevated lactate in the presence of UTI. Brief IV fluids and IV Rocephin. Improving. Monitor. 3. Dizziness/unclear etiology. Underwent extensive evaluation in the hospital recently without clear cause. Seen by ENT as outpatient on Friday of last week and told to have vertigo. Symptoms seem orthostatic and hence will re-check orthostatic blood pressures. Continue low dose twice a day Valium. 4. Syncope: Unclear etiology? Orthostatic? Seizures. Check orthostatic blood pressures. CT head without acute findings. No arrhythmias on EKG. Monitor on telemetry. 5. Involuntary movements: DD-chills from UTI sepsis, orthostatic symptoms, less likely to be seizures. In any event we'll obtain EEG. CT head negative. Neurology consulted. 6. Uncontrolled type II DM: Continue Lantus and place on NovoLog SSI. Hold oral medications. 7. Essential hypertension: Mildly uncontrolled. Continue home medications and monitor. 8. Anxiety: Continue Valium.    Code Status: Full  Family Communication: Discussed with sister and daughter at bedside.  Disposition Plan: Return to SNF when medically stable.   Time spent: 88 minutes   HONGALGI,ANAND, MD, FACP, FHM. [facility] Pager [phone]  If 7PM-7AM, please contact night-coverage [email or web address] Password [facility] 01/02/2014, 3:33 PM

## 2014-01-02 NOTE — ED Provider Notes (Signed)
CSN: [id number]     Arrival date & time 01/02/14  1011 History   First MD Initiated Contact with Patient 01/02/14 1012     Chief Complaint  Patient presents with  . Fall  . Shaking     (Consider location/radiation/quality/duration/timing/severity/associated sxs/prior Treatment) HPI  79 year old male presents after falling at his rehabilitation facility. He was in the bathroom and turned around and seemed to fall and hit his head on a towel rack. He did not lose consciousness. Patient thinks dizziness plate a role since he's been dizzy for 2 straight weeks. He was admitted 2 weeks ago for the same. Denies hitting his neck or having neck pain. No weakness or numbness. He's had shaking in all 4 extremities since then. He is always awake and alert during this episode. Family at the bedside (sister and daughter) states that he had shaking like this once before in the hospital. They're unsure where it came from. Patient denies any fevers or chills. Denies dyspnea. Family endorse that he has had a cough with sputum production over last few days.  Past Medical History  Diagnosis Date  . CAD (coronary artery disease)   . History of syncope   . HTN (hypertension)   . Borderline hyperlipidemia   . History of gastritis   . Diverticulosis of colon   . Colon polyps   . Hypertrophy of prostate with urinary obstruction and other lower urinary tract symptoms (LUTS)   . History of pyelonephritis   . DJD (degenerative joint disease)   . Anxiety   . DM (diabetes mellitus)     Adult onset  . 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 History of kidney stones    Past Surgical History  Procedure Laterality Date  . Appendectomy    . Cataract extraction      RIGHT EYE  . Knee surgery      bilateral ARTHROSCOPY X2 TO EACH KNEE  . Shoulder surgery  04/2005    left by Dr. Berenice Primas  . S/p elap 1986 w/excision of leiomyoma @ ge junction,  meckle's divertic resected, & cholecystectomy    . S/p cysto & stents for kidnedy stone 11/08 by dr. Reece Agar    . Cystoscopy  04/17/11    stent placed  . Cystoscopy w/ ureteral stent placement  04/17/2011    Procedure: CYSTOSCOPY WITH RETROGRADE PYELOGRAM/URETERAL STENT PLACEMENT;  Surgeon: Hanley Ben, MD;  Location: [facility];  Service: Urology;  Laterality: Left;  . Cystoscopy with retrograde pyelogram, ureteroscopy and stent placement Left 04/18/2013    Procedure: CYSTOSCOPY WITH LEFT RETROGRADE PYELOGRAM, LEFT URETEROSCOPY AND LASER LITHOTRIPSY LEFT STENT PLACEMENT;  Surgeon: Dutch Gray, MD;  Location: [facility];  Service: Urology;  Laterality: Left;  . Holmium laser application Left 04/18/1015    Procedure: HOLMIUM LASER APPLICATION;  Surgeon: Dutch Gray, MD;  Location: [facility];  Service: Urology;  Laterality: Left;   Family History  Problem Relation Age of Onset  . Heart attack Mother   . Dementia Brother   . Colon cancer Neg Hx   . Prostate cancer Neg Hx    History  Substance Use Topics  . Smoking status: Never Smoker   . Smokeless tobacco: Never Used  . Alcohol Use: No    Review of Systems  Respiratory: Positive for cough. Negative for shortness of breath.   Cardiovascular: Negative for chest pain.  Gastrointestinal: Negative for  vomiting and abdominal pain.  Neurological: Positive for dizziness and tremors. Negative for seizures, syncope, weakness, numbness and headaches.      Allergies  Codeine phosphate and Morphine  Home Medications   Prior to Admission medications   Medication Sig Start Date End Date Taking? Authorizing Provider  amLODipine (NORVASC) 10 MG tablet Take 1 tablet (10 mg total) by mouth every morning. 11/10/13   Tonia Ghent, MD  aspirin EC 81 MG tablet Take 81 mg by mouth daily.    Historical Provider, MD  diazepam (VALIUM) 5 MG tablet Take 0.5 tablets (2.5 mg total) by mouth 2 (two) times daily. 12/17/13   Maryann Mikhail, DO  glimepiride (AMARYL) 4 MG  tablet Take 1 tablet (4 mg total) by mouth daily with breakfast. 11/10/13   Tonia Ghent, MD  hydrALAZINE (APRESOLINE) 25 MG tablet TAKE 1 TABLET BY MOUTH 2 TIMES A DAY. 11/10/13   Tonia Ghent, MD  lisinopril-hydrochlorothiazide (PRINZIDE,ZESTORETIC) 20-25 MG per tablet TAKE 1 TABLET BY MOUTH EVERY MORNING. 11/10/13   Tonia Ghent, MD  lisinopril-hydrochlorothiazide (PRINZIDE,ZESTORETIC) 20-25 MG per tablet Take 1 tablet by mouth daily.    Historical Provider, MD  metFORMIN (GLUCOPHAGE) 1000 MG tablet Take 1 tablet (1,000 mg total) by mouth 2 (two) times daily with a meal. 12/19/13   Maryann Mikhail, DO  metoprolol (LOPRESSOR) 50 MG tablet Take 1 tablet (50 mg total) by mouth 2 (two) times daily. 11/10/13   Tonia Ghent, MD  potassium chloride SA (KLOR-CON M20) 20 MEQ tablet Take one by mouth daily. 11/10/13   Tonia Ghent, MD  pravastatin (PRAVACHOL) 40 MG tablet Take 1 tablet (40 mg total) by mouth at bedtime. 11/10/13   Tonia Ghent, MD  tamsulosin (FLOMAX) 0.4 MG CAPS capsule Take 1 capsule (0.4 mg total) by mouth at bedtime. 11/10/13   Tonia Ghent, MD   BP 132/84 mmHg  Pulse 131  Temp(Src) 98.3 F (36.8 C)  Resp 20  SpO2 95% Physical Exam  Constitutional: He is oriented to person, place, and time. He appears well-developed and well-nourished.  HENT:  Head: Normocephalic and atraumatic.  Right Ear: External ear normal.  Left Ear: External ear normal.  Nose: Nose normal.  Eyes: Right eye exhibits no discharge. Left eye exhibits no discharge.  Neck: Neck supple.  Cardiovascular: Regular rhythm, normal heart sounds and intact distal pulses.  Tachycardia present.   Pulmonary/Chest: Effort normal. No accessory muscle usage. Tachypnea noted. He has no decreased breath sounds. He has no wheezes. He has no rhonchi.  Abdominal: Soft. There is no tenderness.  Musculoskeletal: He exhibits no edema.  Neurological: He is alert and oriented to person, place, and time.  CN II-12  grossly intact. Normal strength in all 4 extremities. Patient intermittently has diffuse shaking of all 4 extremity is well remaining alert and awake. This seems to get better with reassurance and talking to the patient. Seems to increase whenever he is talking about his dizziness.  Skin: Skin is warm and dry.  Nursing note and vitals reviewed.   ED Course  Procedures (including critical care time) Labs Review Labs Reviewed  COMPREHENSIVE METABOLIC PANEL - Abnormal; Notable for the following:    Glucose, Bld 247 (*)    GFR calc non Af Amer 61 (*)    GFR calc Af Amer 70 (*)    Anion gap 19 (*)    All other components within normal limits  URINALYSIS, ROUTINE W REFLEX MICROSCOPIC - Abnormal; Notable for  the following:    APPearance CLOUDY (*)    Glucose, UA 100 (*)    Protein, ur 30 (*)    Nitrite POSITIVE (*)    Leukocytes, UA MODERATE (*)    All other components within normal limits  URINE MICROSCOPIC-ADD ON - Abnormal; Notable for the following:    Bacteria, UA FEW (*)    All other components within normal limits  I-STAT CG4 LACTIC ACID, ED - Abnormal; Notable for the following:    Lactic Acid, Venous 3.32 (*)    All other components within normal limits  URINE CULTURE  CBC WITH DIFFERENTIAL    Imaging Review Ct Head Wo Contrast  01/02/2014   CLINICAL DATA:  [place] striking head on towel rack this morning, dizziness before fall, head injury, history coronary artery disease post MI, hypertension, diabetes  EXAM: CT HEAD WITHOUT CONTRAST  TECHNIQUE: Contiguous axial images were obtained from the base of the skull through the vertex without intravenous contrast.  COMPARISON:  12/16/2013  FINDINGS: Mild atrophy.  Normal ventricular morphology.  No midline shift or mass effect.  Otherwise normal appearance of brain parenchyma.  No intracranial hemorrhage, mass lesion or evidence acute infarction.  No extra-axial fluid collections.  Atherosclerotic calcifications of the vertebral, basilar  and internal carotid arteries.  Tiny amount of fluid dependently in RIGHT mastoid air cells, unchanged.  Visualized sinuses clear and bones unremarkable.  IMPRESSION: Mild generalized atrophy.  No acute intracranial abnormalities.  No significant interval change.   Electronically Signed   By: Lavonia Dana M.D.   On: 01/02/2014 11:18   Ct Angio Chest Pe W/cm &/or Wo Cm  01/02/2014   CLINICAL DATA:  Syncopal episode.  Shortness of breath.  Hypoxia.  EXAM: CT ANGIOGRAPHY CHEST WITH CONTRAST  TECHNIQUE: Multidetector CT imaging of the chest was performed using the standard protocol during bolus administration of intravenous contrast. Multiplanar CT image reconstructions and MIPs were obtained to evaluate the vascular anatomy.  CONTRAST:  149mL OMNIPAQUE IOHEXOL 350 MG/ML SOLN  COMPARISON:  None.  FINDINGS: Vascular/Cardiac: Satisfactory opacification of pulmonary arteries is demonstated, and no pulmonary emboli are identified. No evidence of thoracic aortic aneurysm or other significant abnormality. Mild cardiomegaly noted.  Mediastinum/Hilar Regions: No masses or pathologically enlarged lymph nodes identified.  Lungs:  No pulmonary infiltrate or mass identified.  Pleura:  No evidence of effusion or mass.  Musculoskeletal:  No suspicious bone lesions identified.  Other:  None.  Review of the MIP images confirms the above findings.  IMPRESSION: No evidence of pulmonary embolism or other acute findings.  Mild cardiomegaly.   Electronically Signed   By: Earle Gell M.D.   On: 01/02/2014 13:24   Dg Chest Port 1 View  01/02/2014   CLINICAL DATA:  Cough.  EXAM: PORTABLE CHEST - 1 VIEW  COMPARISON:  December 15, 2013.  FINDINGS: The heart size and mediastinal contours are within normal limits. Both lungs are clear. No pneumothorax or pleural effusion is noted. The visualized skeletal structures are unremarkable.  IMPRESSION: No acute cardiopulmonary abnormality seen.   Electronically Signed   By: Sabino Dick M.D.   On:  01/02/2014 11:42     EKG Interpretation   Date/Time:  Monday January 02 2014 10:10:30 EST Ventricular Rate:  131 PR Interval:  123 QRS Duration: 100 QT Interval:  299 QTC Calculation: 441 R Axis:   54 Text Interpretation:  Age not entered, assumed to be  79 years old for  purpose of ECG interpretation Sinus tachycardia  Abnormal R-wave  progression, late transition Inferior infarct, old Baseline wander in  lead(s) V3 V4 tachycardia new compared to 12/15/13 Confirmed by Cyndia Degraff   MD, [place] ([id number]) on 01/02/2014 11:16:10 AM      MDM   Final diagnoses:  UTI (lower urinary tract infection)    Patient has persistent tachycardia despite 2 L of IV fluids. He does have an elevated lactic acid in the setting of a UTI. Difficult to tell if his shaking is from chills versus some other tremor-like sensation. He is fully awake and alert when he has shaking in all 4 extremity. Not consistent with a seizure. His blood pressures remain normal in the ED. At this point will give IV fluids, IV Rocephin, and admit to the hospitalist given persistent tachycardia.    Ephraim Hamburger, MD 01/02/14 [phone]

## 2014-01-02 NOTE — ED Notes (Signed)
Lactic acid results given to Browning, PA-C 

## 2014-01-03 ENCOUNTER — Inpatient Hospital Stay (HOSPITAL_COMMUNITY): Payer: Medicare Other

## 2014-01-03 DIAGNOSIS — N390 Urinary tract infection, site not specified: Secondary | ICD-10-CM | POA: Diagnosis present

## 2014-01-03 DIAGNOSIS — A419 Sepsis, unspecified organism: Secondary | ICD-10-CM | POA: Diagnosis present

## 2014-01-03 DIAGNOSIS — G629 Polyneuropathy, unspecified: Secondary | ICD-10-CM | POA: Diagnosis present

## 2014-01-03 DIAGNOSIS — I517 Cardiomegaly: Secondary | ICD-10-CM

## 2014-01-03 DIAGNOSIS — I2510 Atherosclerotic heart disease of native coronary artery without angina pectoris: Secondary | ICD-10-CM | POA: Diagnosis present

## 2014-01-03 DIAGNOSIS — E1151 Type 2 diabetes mellitus with diabetic peripheral angiopathy without gangrene: Secondary | ICD-10-CM | POA: Diagnosis present

## 2014-01-03 DIAGNOSIS — Z79899 Other long term (current) drug therapy: Secondary | ICD-10-CM | POA: Diagnosis not present

## 2014-01-03 DIAGNOSIS — F419 Anxiety disorder, unspecified: Secondary | ICD-10-CM | POA: Diagnosis present

## 2014-01-03 DIAGNOSIS — R251 Tremor, unspecified: Secondary | ICD-10-CM

## 2014-01-03 DIAGNOSIS — I10 Essential (primary) hypertension: Secondary | ICD-10-CM | POA: Diagnosis present

## 2014-01-03 DIAGNOSIS — E872 Acidosis: Secondary | ICD-10-CM | POA: Diagnosis present

## 2014-01-03 DIAGNOSIS — E785 Hyperlipidemia, unspecified: Secondary | ICD-10-CM | POA: Diagnosis present

## 2014-01-03 DIAGNOSIS — I252 Old myocardial infarction: Secondary | ICD-10-CM | POA: Diagnosis not present

## 2014-01-03 DIAGNOSIS — R55 Syncope and collapse: Secondary | ICD-10-CM | POA: Diagnosis present

## 2014-01-03 DIAGNOSIS — Z794 Long term (current) use of insulin: Secondary | ICD-10-CM | POA: Diagnosis not present

## 2014-01-03 DIAGNOSIS — E1165 Type 2 diabetes mellitus with hyperglycemia: Secondary | ICD-10-CM | POA: Diagnosis present

## 2014-01-03 DIAGNOSIS — R569 Unspecified convulsions: Secondary | ICD-10-CM | POA: Diagnosis present

## 2014-01-03 DIAGNOSIS — R4182 Altered mental status, unspecified: Secondary | ICD-10-CM

## 2014-01-03 DIAGNOSIS — Z7982 Long term (current) use of aspirin: Secondary | ICD-10-CM | POA: Diagnosis not present

## 2014-01-03 DIAGNOSIS — I5022 Chronic systolic (congestive) heart failure: Secondary | ICD-10-CM | POA: Diagnosis present

## 2014-01-03 DIAGNOSIS — R32 Unspecified urinary incontinence: Secondary | ICD-10-CM | POA: Diagnosis present

## 2014-01-03 DIAGNOSIS — I429 Cardiomyopathy, unspecified: Secondary | ICD-10-CM | POA: Diagnosis present

## 2014-01-03 LAB — GLUCOSE, CAPILLARY
GLUCOSE-CAPILLARY: 167 mg/dL — AB (ref 70–99)
Glucose-Capillary: 120 mg/dL — ABNORMAL HIGH (ref 70–99)
Glucose-Capillary: 142 mg/dL — ABNORMAL HIGH (ref 70–99)
Glucose-Capillary: 222 mg/dL — ABNORMAL HIGH (ref 70–99)

## 2014-01-03 MED ORDER — HYDROCHLOROTHIAZIDE 25 MG PO TABS
25.0000 mg | ORAL_TABLET | Freq: Every day | ORAL | Status: DC
  Administered 2014-01-03 – 2014-01-06 (×4): 25 mg via ORAL
  Filled 2014-01-03 (×4): qty 1

## 2014-01-03 MED ORDER — METOPROLOL TARTRATE 50 MG PO TABS
50.0000 mg | ORAL_TABLET | Freq: Two times a day (BID) | ORAL | Status: DC
  Administered 2014-01-03 – 2014-01-04 (×3): 50 mg via ORAL
  Filled 2014-01-03 (×4): qty 1

## 2014-01-03 MED ORDER — LORAZEPAM 2 MG/ML IJ SOLN
2.0000 mg | INTRAMUSCULAR | Status: AC | PRN
  Administered 2014-01-03 – 2014-01-04 (×2): 2 mg via INTRAVENOUS
  Filled 2014-01-03: qty 1

## 2014-01-03 MED ORDER — SODIUM CHLORIDE 0.9 % IV SOLN
INTRAVENOUS | Status: AC
  Administered 2014-01-03 – 2014-01-04 (×2): via INTRAVENOUS

## 2014-01-03 MED ORDER — METOPROLOL TARTRATE 1 MG/ML IV SOLN
5.0000 mg | Freq: Four times a day (QID) | INTRAVENOUS | Status: DC
  Filled 2014-01-03 (×4): qty 5

## 2014-01-03 MED ORDER — LORAZEPAM 2 MG/ML IJ SOLN
INTRAMUSCULAR | Status: AC
  Filled 2014-01-03: qty 2

## 2014-01-03 MED ORDER — LORAZEPAM 2 MG/ML IJ SOLN: 1.0000 mg | Freq: Two times a day (BID) | INTRAMUSCULAR | Status: DC

## 2014-01-03 MED ORDER — LORAZEPAM 2 MG/ML IJ SOLN
INTRAMUSCULAR | Status: AC
  Filled 2014-01-03: qty 1

## 2014-01-03 MED ORDER — PRAVASTATIN SODIUM 40 MG PO TABS
40.0000 mg | ORAL_TABLET | Freq: Every day | ORAL | Status: DC
  Administered 2014-01-03 – 2014-01-05 (×3): 40 mg via ORAL
  Filled 2014-01-03 (×4): qty 1

## 2014-01-03 MED ORDER — ASPIRIN EC 325 MG PO TBEC
325.0000 mg | DELAYED_RELEASE_TABLET | Freq: Every day | ORAL | Status: DC
  Administered 2014-01-04 – 2014-01-06 (×3): 325 mg via ORAL
  Filled 2014-01-03 (×3): qty 1

## 2014-01-03 MED ORDER — LISINOPRIL 20 MG PO TABS
20.0000 mg | ORAL_TABLET | Freq: Every day | ORAL | Status: DC
  Administered 2014-01-03 – 2014-01-06 (×4): 20 mg via ORAL
  Filled 2014-01-03 (×4): qty 1

## 2014-01-03 MED ORDER — HYDRALAZINE HCL 50 MG PO TABS
50.0000 mg | ORAL_TABLET | Freq: Three times a day (TID) | ORAL | Status: DC
  Administered 2014-01-03 – 2014-01-04 (×3): 50 mg via ORAL
  Filled 2014-01-03 (×5): qty 1

## 2014-01-03 MED ORDER — GUAIFENESIN-DM 100-10 MG/5ML PO SYRP
5.0000 mL | ORAL_SOLUTION | ORAL | Status: DC | PRN
  Administered 2014-01-04: 5 mL via ORAL
  Filled 2014-01-03 (×2): qty 5

## 2014-01-03 MED ORDER — HYDRALAZINE HCL 20 MG/ML IJ SOLN: 10.0000 mg | INTRAMUSCULAR | Status: DC | PRN

## 2014-01-03 MED ORDER — LORAZEPAM 2 MG/ML IJ SOLN
1.0000 mg | Freq: Once | INTRAMUSCULAR | Status: AC
  Administered 2014-01-03: 1 mg via INTRAVENOUS

## 2014-01-03 MED ORDER — LISINOPRIL-HYDROCHLOROTHIAZIDE 20-25 MG PO TABS: 1.0000 | ORAL_TABLET | Freq: Every day | ORAL | Status: DC

## 2014-01-03 MED ORDER — TAMSULOSIN HCL 0.4 MG PO CAPS
0.4000 mg | ORAL_CAPSULE | Freq: Every day | ORAL | Status: DC
  Administered 2014-01-03 – 2014-01-06 (×4): 0.4 mg via ORAL
  Filled 2014-01-03 (×4): qty 1

## 2014-01-03 MED ORDER — AMLODIPINE BESYLATE 10 MG PO TABS
10.0000 mg | ORAL_TABLET | Freq: Every morning | ORAL | Status: DC
  Administered 2014-01-03 – 2014-01-06 (×4): 10 mg via ORAL
  Filled 2014-01-03 (×4): qty 1

## 2014-01-03 MED ORDER — DIAZEPAM 5 MG PO TABS
2.5000 mg | ORAL_TABLET | Freq: Two times a day (BID) | ORAL | Status: DC
  Administered 2014-01-03 – 2014-01-06 (×7): 2.5 mg via ORAL
  Filled 2014-01-03 (×7): qty 1

## 2014-01-03 NOTE — Plan of Care (Signed)
Problem: Phase I Progression Outcomes Goal: OOB as tolerated unless otherwise ordered Outcome: Progressing Goal: Initial discharge plan identified Outcome: Progressing Goal: Hemodynamically stable Outcome: Progressing

## 2014-01-03 NOTE — Progress Notes (Signed)
PROGRESS NOTE  [name] OJJ:[id number] DOB: 02-24-1934 DOA: 01/02/2014 PCP: [name], MD  [name] is a 79 y.o. male with history of DM 2/IDDM, essential hypertension, CAD, HLD, BPH/UTI/kidney stones, anxiety, vertigo who was recently hospitalized 12/15/13-12/17/13. He was extensively evaluated including negative orthostatic blood pressures, CTA head and symptoms were felt to be secondary to possible Mnire's disease. During that admission, he had one episode of similar involuntary generalized shaking during OT evaluation. As per neurology recommendations, he was discharged on low-dose Valium and followed up with ENT 3 days back and was told to have vertigo. While at SNF the day of admission, he had an episode of dizziness and then fall.  Subsequently the same morning he had an episode of convulsive shaking.  The patient had a similar shaking episode which was not consistent with chills or seizures and he was awake during the episode.  He was not orthostatic on admission.  UA was suspicious for urinary tract infection and lactic acid elevated. CT head without acute findings, CTA chest negative for PE or acute findings.  As per family, patient had a fever of 101 prior to ED arrival.  Assessment/Plan:  Convulsive episodes On my exam this am, patient sat up on the side of the bed, turned his head left, fell back on the bed and began to convulse uncontrollably.  Convulsing ended after approximately 2 minutes once the patient was flat on the bed in slight trendelenburg position.  Patient was able to tell me his name, but reaction time was very slow.  +urinary incontinence during this episode. Approximately 5-10 minutes after the episode the patient sat up again and had a second convulsive episode.   Patient received ativan.   Speech eval ordered.  Patient made temporarily NPO.   No history of seizures prior to November 2015.   CT Head negative.   EEG ordered.  Neuro onboard.  Checking  2D echo.  UTI U/A appears infected.  Patient started on Rocephin.  Culture pending.  Per patient's sister at bedside the patient gets semi-annual kidney stones and it is time for him to have a kidney stone.  Early sepsis secondary to UTI Resolved.  DM II cbgs well controlled in house on lantus and sliding scale. HGb A1c 7.5 on 10/1  Hypertension Holding oral medications until patient is evaluated by speech.  Will place on metoprolol q 6 IV with holding parameters and Hydralazine IV q 4 hour prn.  Hacking cough with clear sputum. Patient states he has had this cough for several weeks. CXR unremarkable  Will give prn robitussin and check echo.  Lactic Acidosis Due to Fall and possible seizure IVF  Recheck lactic acid in am.  Hx of CAD and MI Continue aspirin. Recheck 2D echo.   DVT Prophylaxis:  lovenox  Code Status: full Family Communication: Sister at bedside. Disposition Plan: SNF.  Patient normally lives at home alone.  His sister lives across the street.   Consultants:  Neurology  Procedures:  EEG, Echo pending.  Antibiotics: Anti-infectives    Start     Dose/Rate Route Frequency Ordered Stop   01/03/14 1000  cefTRIAXone (ROCEPHIN) 1 g in dextrose 5 % 50 mL IVPB - Premix     1 g100 mL/hr over 30 Minutes Intravenous Every 24 hours 01/02/14 1612     01/02/14 1245  cefTRIAXone (ROCEPHIN) 1 g in dextrose 5 % 50 mL IVPB     1 g100 mL/hr over 30 Minutes Intravenous  Once 01/02/14 1238 01/02/14 1344        HPI/Subjective: Patient without complaint.  Sister at bedside is very concerned with multiple questions about what tests were going to be done today.  Objective: Filed Vitals:   01/03/14 0838 01/03/14 0847 01/03/14 0953 01/03/14 1051  BP: 159/78 155/73 155/73 160/72  Pulse: 69 70 70 72  Temp:      TempSrc:      Resp:      Weight:      SpO2:  96%      Intake/Output Summary (Last 24 hours) at 01/03/14 1205 Last data filed at 01/02/14 1916  Gross  per 24 hour  Intake  405.5 ml  Output    250 ml  Net  155.5 ml   Filed Weights   01/03/14 0556  Weight: 103.465 kg (228 lb 1.6 oz)    Exam: General: Well developed, well nourished, NAD, appears stated age  33:  PERR, EOMI, Anicteic Sclera, MMM. No pharyngeal erythema or exudates  Neck: Supple, no JVD, no masses  Cardiovascular: RRR, S1 S2 auscultated, no rubs, murmurs or gallops.   Respiratory: Clear to auscultation bilaterally with equal chest rise  Abdomen: Soft, nontender, nondistended, + bowel sounds  Extremities: warm dry without cyanosis clubbing or edema.  Neuro: AAOx3, cranial nerves grossly intact. Strength 5/5 in upper and lower extremities.  Patient slowly follows commands immediately after convulsing episode. Skin: Without rashes exudates or nodules.    Data Reviewed: Basic Metabolic Panel:  Recent Labs Lab 01/02/14 1039  NA 141  K 3.7  CL 100  CO2 22  GLUCOSE 247*  BUN 17  CREATININE 1.13  CALCIUM 9.7   Liver Function Tests:  Recent Labs Lab 01/02/14 1039  AST 22  ALT 34  ALKPHOS 66  BILITOT 0.5  PROT 6.9  ALBUMIN 3.9   CBC:  Recent Labs Lab 01/02/14 1039  WBC 6.4  NEUTROABS 4.3  HGB 15.5  HCT 45.4  MCV 94.6  PLT 297   CBG:  Recent Labs Lab 01/02/14 1655 01/02/14 2207 01/03/14 0815  GLUCAP 123* 168* 120*    Recent Results (from the past 240 hour(s))  MRSA PCR Screening     Status: None   Collection Time: 01/02/14  4:18 PM  Result Value Ref Range Status   MRSA by PCR NEGATIVE NEGATIVE Final    Comment:        The GeneXpert MRSA Assay (FDA approved for NASAL specimens only), is one component of a comprehensive MRSA colonization surveillance program. It is not intended to diagnose MRSA infection nor to guide or monitor treatment for MRSA infections.      Studies: Ct Head Wo Contrast  01/02/2014   CLINICAL DATA:  [place] striking head on towel rack this morning, dizziness before fall, head injury, history coronary  artery disease post MI, hypertension, diabetes  EXAM: CT HEAD WITHOUT CONTRAST  TECHNIQUE: Contiguous axial images were obtained from the base of the skull through the vertex without intravenous contrast.  COMPARISON:  12/16/2013  FINDINGS: Mild atrophy.  Normal ventricular morphology.  No midline shift or mass effect.  Otherwise normal appearance of brain parenchyma.  No intracranial hemorrhage, mass lesion or evidence acute infarction.  No extra-axial fluid collections.  Atherosclerotic calcifications of the vertebral, basilar and internal carotid arteries.  Tiny amount of fluid dependently in RIGHT mastoid air cells, unchanged.  Visualized sinuses clear and bones unremarkable.  IMPRESSION: Mild generalized atrophy.  No acute intracranial abnormalities.  No significant interval change.  Electronically Signed   By: Lavonia Dana M.D.   On: 01/02/2014 11:18   Ct Angio Chest Pe W/cm &/or Wo Cm  01/02/2014   CLINICAL DATA:  Syncopal episode.  Shortness of breath.  Hypoxia.  EXAM: CT ANGIOGRAPHY CHEST WITH CONTRAST  TECHNIQUE: Multidetector CT imaging of the chest was performed using the standard protocol during bolus administration of intravenous contrast. Multiplanar CT image reconstructions and MIPs were obtained to evaluate the vascular anatomy.  CONTRAST:  17mL OMNIPAQUE IOHEXOL 350 MG/ML SOLN  COMPARISON:  None.  FINDINGS: Vascular/Cardiac: Satisfactory opacification of pulmonary arteries is demonstated, and no pulmonary emboli are identified. No evidence of thoracic aortic aneurysm or other significant abnormality. Mild cardiomegaly noted.  Mediastinum/Hilar Regions: No masses or pathologically enlarged lymph nodes identified.  Lungs:  No pulmonary infiltrate or mass identified.  Pleura:  No evidence of effusion or mass.  Musculoskeletal:  No suspicious bone lesions identified.  Other:  None.  Review of the MIP images confirms the above findings.  IMPRESSION: No evidence of pulmonary embolism or other acute  findings.  Mild cardiomegaly.   Electronically Signed   By: Earle Gell M.D.   On: 01/02/2014 13:24   Dg Chest Port 1 View  01/02/2014   CLINICAL DATA:  Cough.  EXAM: PORTABLE CHEST - 1 VIEW  COMPARISON:  December 15, 2013.  FINDINGS: The heart size and mediastinal contours are within normal limits. Both lungs are clear. No pneumothorax or pleural effusion is noted. The visualized skeletal structures are unremarkable.  IMPRESSION: No acute cardiopulmonary abnormality seen.   Electronically Signed   By: Sabino Dick M.D.   On: 01/02/2014 11:42    Scheduled Meds: . antiseptic oral rinse  7 mL Mouth Rinse BID  . aspirin EC  81 mg Oral Daily  . cefTRIAXone (ROCEPHIN)  IV  1 g Intravenous Q24H  . enoxaparin (LOVENOX) injection  40 mg Subcutaneous Q24H  . insulin aspart  0-15 Units Subcutaneous TID WC  . insulin aspart  0-5 Units Subcutaneous QHS  . insulin glargine  20 Units Subcutaneous QHS  . LORazepam  1 mg Intravenous BID  . metoprolol  5 mg Intravenous 4 times per day  . sodium chloride  3 mL Intravenous Q12H   Continuous Infusions: . sodium chloride 75 mL/hr at 01/03/14 0930    Principal Problem:   Involuntary movements Active Problems:   Anxiety state   Essential hypertension   Type 2 diabetes mellitus with vascular disease   UTI (urinary tract infection)   Syncope   UTI (lower urinary tract infection)   Sepsis    [name]  [facility] Pager [phone]. If 7PM-7AM, please contact night-coverage at [email or web address], password [facility] 01/03/2014, 12:04 PM  LOS: 1 day

## 2014-01-03 NOTE — Procedures (Signed)
ELECTROENCEPHALOGRAM REPORT   [name]       Room #: 0Y17 EEG No. ID: [id number] Age: 79 y.o.        Sex: male Referring Physician: Hongalgi Report Date:  01/03/2014        Interpreting Physician: Alexis Goodell D  History: [name] is an 79 y.o. male with episodes of shaking evaluated to rule out seizure  Medications:  Scheduled: . amLODipine  10 mg Oral q morning - 10a  . antiseptic oral rinse  7 mL Mouth Rinse BID  . [START ON 01/04/2014] aspirin EC  325 mg Oral Daily  . cefTRIAXone (ROCEPHIN)  IV  1 g Intravenous Q24H  . diazepam  2.5 mg Oral BID  . enoxaparin (LOVENOX) injection  40 mg Subcutaneous Q24H  . hydrALAZINE  50 mg Oral TID  . hydrochlorothiazide  25 mg Oral Daily  . insulin aspart  0-15 Units Subcutaneous TID WC  . insulin aspart  0-5 Units Subcutaneous QHS  . insulin glargine  20 Units Subcutaneous QHS  . lisinopril  20 mg Oral Daily  . metoprolol  50 mg Oral BID  . pravastatin  40 mg Oral QHS  . sodium chloride  3 mL Intravenous Q12H  . tamsulosin  0.4 mg Oral Daily    Conditions of Recording:  This is a 16 channel EEG carried out with concomitant video with the patient in the awake and drowsy state.  The recording was performed for 5 hours and 12 minutes.    Description:  The waking background activity consists of a low voltage, symmetrical, fairly well organized, 12 Hz alpha activity, seen from the parieto-occipital and posterior temporal regions.  Low voltage fast activity, poorly organized, is seen anteriorly and is at times superimposed on more posterior regions.  A mixture of theta and alpha rhythms are seen from the central and temporal regions. The patient drowses with slowing to irregular, low voltage theta and beta activity.   Stage II sleep is not obtained. The patient had three episodes of wooziness during the recording that had no epileptic correlate and no change in the background activity.   The patient had 5 episodes of  jerking/shaking during the recording.  Three of these were with sitting.  None of these events had epileptic correlate and no change in the background activity was noted. Hyperventilation and intermittent photic stimulation were not performed.   IMPRESSION: This is a normal awake and drowsy prolonged video EEG monitoring.  Three episodes of wooziness were captures as well as five episodes of jerking/shaking.  No change in the background rhythm was noted with any of these episodes.  No epileptiform activity was noted.     Alexis Goodell, MD [facility] [phone] 01/03/2014, 7:21 PM

## 2014-01-03 NOTE — Evaluation (Signed)
Clinical/Bedside Swallow Evaluation Patient Details  Name: [name] MRN: [id number] Date of Birth: Apr 08, 1934  Today's Date: 01/03/2014 Time: 2130-8657 SLP Time Calculation (min) (ACUTE ONLY): 37 min  Past Medical History:  Past Medical History  Diagnosis Date  . CAD (coronary artery disease)   . History of syncope   . HTN (hypertension)   . Borderline hyperlipidemia   . History of gastritis   . Diverticulosis of colon   . Colon polyps   . Hypertrophy of prostate with urinary obstruction and other lower urinary tract symptoms (LUTS)   . History of pyelonephritis   . DJD (degenerative joint disease)   . Anxiety   . DM (diabetes mellitus)     Adult onset  . 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 History of kidney stones    Past Surgical History:  Past Surgical History  Procedure Laterality Date  . Appendectomy    . Cataract extraction      RIGHT EYE  . Knee surgery      bilateral ARTHROSCOPY X2 TO EACH KNEE  . Shoulder surgery  04/2005    left by Dr. Berenice Primas  . 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 S/p cysto & stents for kidnedy stone 11/08 by dr. Reece Agar    . Cystoscopy  04/17/11    stent placed  . Cystoscopy w/ ureteral stent placement  04/17/2011    Procedure: CYSTOSCOPY WITH RETROGRADE PYELOGRAM/URETERAL STENT PLACEMENT;  Surgeon: Hanley Ben, MD;  Location: [facility];  Service: Urology;  Laterality: Left;  . Cystoscopy with retrograde pyelogram, ureteroscopy and stent placement Left 04/18/2013    Procedure: CYSTOSCOPY WITH LEFT RETROGRADE PYELOGRAM, LEFT URETEROSCOPY AND LASER LITHOTRIPSY LEFT STENT PLACEMENT;  Surgeon: Dutch Gray, MD;  Location: [facility];  Service: Urology;  Laterality: Left;  . Holmium laser application Left 09/13/6960    Procedure: HOLMIUM LASER APPLICATION;  Surgeon: Dutch Gray, MD;  Location: [facility];  Service:  Urology;  Laterality: Left;   HPI:  [name] is a 79 y.o. male with history of DM 2/IDDM, essential hypertension, CAD, HLD, BPH/UTI/kidney stones, anxiety, vertigo who was recently hospitalized 12/15/13-12/17/13. He was extensively evaluated including negative orthostatic blood pressures, CTA head and symptoms were felt to be secondary to possible Mnire's disease. During that admission, he had one episode of similar involuntary generalized shaking during OT evaluation. As per neurology recommendations, he was discharged on low-dose Valium and followed up with ENT 3 days back and was told to have vertigo. While at SNF the day of admission, he had an episode of dizziness and then fall.  Subsequently the same morning he had an episode of convulsive shaking. RN concern for change in safe swallow due to patient coughing following sips of thin liquids.    Assessment / Plan / Recommendation Clinical Impression  Bedside swallow evaluation completed.  Oral motor exam remarkable for a thick tongue with mild weakness and mild deviation, which did not appear to impact function.  Patient also pointed out a small swollen/enlarged area on his neck that is to the left of his trachea and above his collar bone; RN was notified. Patient consumed regular textures and ~20 ounces of thin liquids via cup and straw with efficient mastication and cough x1 suspected to be due to large volume cup sip.  SLP initiated education regarding  portion and rate control for overall safety with PO intake, which was effective at preventing any further overt s/s of aspiration throughout session.  Recommend resuming a regular texture diet with thin liquids and brief SLP follow up to ensure carryover of safe swallow strategies and toleration given sister and RN report of significant difficulty earlier.     Aspiration Risk  Mild    Diet Recommendation Regular;Thin liquid   Liquid Administration via: Cup;Straw Medication Administration:  Whole meds with liquid Supervision: Patient able to self feed;Intermittent supervision to cue for compensatory strategies Compensations: Slow rate;Small sips/bites Postural Changes and/or Swallow Maneuvers: Seated upright 90 degrees;Upright 30-60 min after meal    Other  Recommendations Oral Care Recommendations: Oral care BID   Follow Up Recommendations  None    Frequency and Duration min 2x/week  1 week   Pertinent Vitals/Pain None    SLP Swallow Goals  See care plan for details    Swallow Study Prior Functional Status  Regular and thin per report    General Date of Onset: 01/03/14 HPI: [name] is a 79 y.o. male with history of DM 2/IDDM, essential hypertension, CAD, HLD, BPH/UTI/kidney stones, anxiety, vertigo who was recently hospitalized 12/15/13-12/17/13. He was extensively evaluated including negative orthostatic blood pressures, CTA head and symptoms were felt to be secondary to possible Mnire's disease. During that admission, he had one episode of similar involuntary generalized shaking during OT evaluation. As per neurology recommendations, he was discharged on low-dose Valium and followed up with ENT 3 days back and was told to have vertigo. While at SNF the day of admission, he had an episode of dizziness and then fall.  Subsequently the same morning he had an episode of convulsive shaking. RN concern for change in safe swallow due to patient coughing following sips of thin liquids.  Type of Study: Bedside swallow evaluation Previous Swallow Assessment: N/A Diet Prior to this Study: Regular;Thin liquids Temperature Spikes Noted: No Respiratory Status: Nasal cannula History of Recent Intubation: No Behavior/Cognition: Alert;Cooperative;Requires cueing;Hard of hearing;Other (comment) (demonstrated restlessness) Oral Cavity - Dentition: Adequate natural dentition Self-Feeding Abilities: Able to feed self Patient Positioning: Upright in bed Baseline Vocal Quality:  Low vocal intensity Volitional Cough: Strong Volitional Swallow: Able to elicit    Oral/Motor/Sensory Function Overall Oral Motor/Sensory Function: Impaired Labial ROM: Within Functional Limits Labial Symmetry: Within Functional Limits Labial Strength: Within Functional Limits Labial Sensation: Within Functional Limits Lingual ROM: Other (Comment) (thick tongue) Lingual Symmetry: Abnormal symmetry left Lingual Strength: Reduced Lingual Sensation: Within Functional Limits Facial ROM: Within Functional Limits Facial Symmetry: Within Functional Limits Facial Strength: Within Functional Limits Facial Sensation: Within Functional Limits Velum: Within Functional Limits Mandible: Within Functional Limits   Ice Chips Ice chips: Not tested   Thin Liquid Thin Liquid: Within functional limits Presentation: Cup;Straw Other Comments: cough X1 suspect due to increased bolus size and rate    Nectar Thick Nectar Thick Liquid: Not tested   Honey Thick Honey Thick Liquid: Not tested   Puree Puree: Within functional limits Presentation: Self Fed   Solid   GO Functional Assessment Tool Used: clinical judgemment Functional Limitations: Swallowing Swallow Current Status ([id number]): At least 1 percent but less than 20 percent impaired, limited or restricted Swallow Goal Status [phone]): 0 percent impaired, limited or restricted  Solid: Within functional limits Presentation: [place]       Gunnar Fusi, M.A., CCC-SLP [place] 01/03/2014,1:28 PM

## 2014-01-03 NOTE — Progress Notes (Signed)
Patient made RN aware of mass noted to left side of neck. Pt stated it has been there "a while" and is painless. [place] made aware.

## 2014-01-03 NOTE — Progress Notes (Signed)
Subjective: Patient was noted to have episode of tremor and AMS this AM after PT had sat him up for a few minutes and then attempted to lay him down.  Per family member, upon laying down he had bilateral arm tremor and head was turned to the right. He was given a total of 3 mg ativan.  Currently he is alert and oriented.  IT seems many of his events are precipitated by postural changes.    Objective: Current vital signs: BP 155/73 mmHg  Pulse 70  Temp(Src) 97.6 F (36.4 C) (Oral)  Resp 20  Wt 103.465 kg (228 lb 1.6 oz)  SpO2 96% Vital signs in last 24 hours: Temp:  [97.5 F (36.4 C)-99.9 F (37.7 C)] 97.6 F (36.4 C) (11/24 0556) Pulse Rate:  [64-131] 70 (11/24 0847) Resp:  [15-30] 20 (11/24 0805) BP: (132-174)/(73-92) 155/73 mmHg (11/24 0847) SpO2:  [91 %-100 %] 96 % (11/24 0847) Weight:  [103.465 kg (228 lb 1.6 oz)] 103.465 kg (228 lb 1.6 oz) (11/24 0556)  Intake/Output from previous day: 11/23 0701 - 11/24 0700 In: 405.5 [P.O.:358; I.V.:47.5] Out: 250 [Urine:250] Intake/Output this shift:   Nutritional status: Diet heart healthy/carb modified  Neurologic Exam: Neuro Exam: Mental Status: Alert, oriented, thought content appropriate. Speech fluent without evidence of aphasia. Able to follow 3 step commands without difficulty. Cranial Nerves: II: Discs flat bilaterally; Visual fields grossly normal, pupils equal, round, reactive to light and accommodation III,IV, VI: ptosis not present, extra-ocular motions intact bilaterally V,VII: smile symmetric, facial light touch sensation normal bilaterally VIII: hearing normal bilaterally IX,X: gag reflex present XI: bilateral shoulder shrug XII: midline tongue extension without atrophy or fasciculations  Motor: Right :Upper extremity 5/5Left: Upper extremity 5/5 Lower extremity 5/5Lower extremity 5/5 Tone and  bulk:normal tone throughout; no atrophy noted Sensory: Pinprick and light touch intact, no vibratory sensation in the ankles and decreased proprioception in toes.  Deep Tendon Reflexes:  Right: Upper Extremity Left: Upper extremity   biceps (C-5 to C-6) 2/4 biceps (C-5 to C-6) 2/4 tricep (C7) 2/4triceps (C7) 2/4 Brachioradialis (C6) 2/4Brachioradialis (C6) 2/4  Lower Extremity Lower Extremity  quadriceps (L-2 to L-4) 1/4 quadriceps (L-2 to L-4) 1/4 Achilles (S1) 0/4Achilles (S1) 0/4  Plantars: Right: downgoingLeft: downgoing Cerebellar: normal finger-to-nose, normal heel-to-shin test Gait: not tested.  CV: pulses palpable throughout   Lab Results: Basic Metabolic Panel:  Recent Labs Lab 01/02/14 1039  NA 141  K 3.7  CL 100  CO2 22  GLUCOSE 247*  BUN 17  CREATININE 1.13  CALCIUM 9.7    Liver Function Tests:  Recent Labs Lab 01/02/14 1039  AST 22  ALT 34  ALKPHOS 66  BILITOT 0.5  PROT 6.9  ALBUMIN 3.9   No results for input(s): LIPASE, AMYLASE in the last 168 hours. No results for input(s): AMMONIA in the last 168 hours.  CBC:  Recent Labs Lab 01/02/14 1039  WBC 6.4  NEUTROABS 4.3  HGB 15.5  HCT 45.4  MCV 94.6  PLT 297    Cardiac Enzymes: No results for input(s): CKTOTAL, CKMB, CKMBINDEX, TROPONINI in the last 168 hours.  Lipid Panel: No results for input(s): CHOL, TRIG, HDL, CHOLHDL, VLDL, LDLCALC in the last 168 hours.  CBG:  Recent Labs Lab 01/02/14 1655 01/02/14 2207 01/03/14 0815  GLUCAP 123* 168* 120*    Microbiology: Results for orders placed or performed during the hospital encounter of 01/02/14  MRSA PCR Screening     Status: None   Collection Time: 01/02/14  4:18 PM  Result Value Ref Range Status   MRSA by PCR NEGATIVE NEGATIVE Final     Comment:        The GeneXpert MRSA Assay (FDA approved for NASAL specimens only), is one component of a comprehensive MRSA colonization surveillance program. It is not intended to diagnose MRSA infection nor to guide or monitor treatment for MRSA infections.     Coagulation Studies: No results for input(s): LABPROT, INR in the last 72 hours.  Imaging: Ct Head Wo Contrast  01/02/2014   CLINICAL DATA:  [place] striking head on towel rack this morning, dizziness before fall, head injury, history coronary artery disease post MI, hypertension, diabetes  EXAM: CT HEAD WITHOUT CONTRAST  TECHNIQUE: Contiguous axial images were obtained from the base of the skull through the vertex without intravenous contrast.  COMPARISON:  12/16/2013  FINDINGS: Mild atrophy.  Normal ventricular morphology.  No midline shift or mass effect.  Otherwise normal appearance of brain parenchyma.  No intracranial hemorrhage, mass lesion or evidence acute infarction.  No extra-axial fluid collections.  Atherosclerotic calcifications of the vertebral, basilar and internal carotid arteries.  Tiny amount of fluid dependently in RIGHT mastoid air cells, unchanged.  Visualized sinuses clear and bones unremarkable.  IMPRESSION: Mild generalized atrophy.  No acute intracranial abnormalities.  No significant interval change.   Electronically Signed   By: Lavonia Dana M.D.   On: 01/02/2014 11:18   Ct Angio Chest Pe W/cm &/or Wo Cm  01/02/2014   CLINICAL DATA:  Syncopal episode.  Shortness of breath.  Hypoxia.  EXAM: CT ANGIOGRAPHY CHEST WITH CONTRAST  TECHNIQUE: Multidetector CT imaging of the chest was performed using the standard protocol during bolus administration of intravenous contrast. Multiplanar CT image reconstructions and MIPs were obtained to evaluate the vascular anatomy.  CONTRAST:  118mL OMNIPAQUE IOHEXOL 350 MG/ML SOLN  COMPARISON:  None.  FINDINGS: Vascular/Cardiac: Satisfactory opacification of pulmonary arteries is  demonstated, and no pulmonary emboli are identified. No evidence of thoracic aortic aneurysm or other significant abnormality. Mild cardiomegaly noted.  Mediastinum/Hilar Regions: No masses or pathologically enlarged lymph nodes identified.  Lungs:  No pulmonary infiltrate or mass identified.  Pleura:  No evidence of effusion or mass.  Musculoskeletal:  No suspicious bone lesions identified.  Other:  None.  Review of the MIP images confirms the above findings.  IMPRESSION: No evidence of pulmonary embolism or other acute findings.  Mild cardiomegaly.   Electronically Signed   By: Earle Gell M.D.   On: 01/02/2014 13:24   Dg Chest Port 1 View  01/02/2014   CLINICAL DATA:  Cough.  EXAM: PORTABLE CHEST - 1 VIEW  COMPARISON:  December 15, 2013.  FINDINGS: The heart size and mediastinal contours are within normal limits. Both lungs are clear. No pneumothorax or pleural effusion is noted. The visualized skeletal structures are unremarkable.  IMPRESSION: No acute cardiopulmonary abnormality seen.   Electronically Signed   By: Sabino Dick M.D.   On: 01/02/2014 11:42    Medications:  Scheduled: . amLODipine  10 mg Oral q morning - 10a  . antiseptic oral rinse  7 mL Mouth Rinse BID  . aspirin EC  81 mg Oral Daily  . cefTRIAXone (ROCEPHIN)  IV  1 g Intravenous Q24H  . diazepam  2.5 mg Oral BID  . enoxaparin (LOVENOX) injection  40 mg Subcutaneous Q24H  . hydrALAZINE  50 mg Oral TID  . hydrochlorothiazide  25 mg Oral Daily  . insulin aspart  0-15 Units  Subcutaneous TID WC  . insulin aspart  0-5 Units Subcutaneous QHS  . insulin glargine  20 Units Subcutaneous QHS  . lisinopril  20 mg Oral Daily  . metoprolol  50 mg Oral BID  . pravastatin  40 mg Oral QHS  . sodium chloride  3 mL Intravenous Q12H   Continuous:  OZY:[id number] **OR** acetaminophen, albuterol, LORazepam, ondansetron **OR** ondansetron (ZOFRAN) IV  Assessment/Plan: 79 YO male with ongoing dizziness in the setting of negative MRI and  CTA of neck and head. Patient returns to hospital after episode of dizziness causing fall. In further discussion the overall picture is more indicative of postural hypotension or autonomic dysautonomia. This AM patient was sat in the upright position and then had 2-3 episodes of shaking when placed back in supine position. Currently back to baseline.   Recommendation: 1) EEG--prolonged with postural changes in hopes to capture episode.  2) If EEG negative consider tilt table test and out patient neurological work up for the above mentioned.  3) treat underlying UTI       Etta Quill PA-C [facility] [phone]  01/03/2014, 9:25 AM

## 2014-01-03 NOTE — Progress Notes (Signed)
  Echocardiogram 2D Echocardiogram has been performed.  [name] M 01/03/2014, 2:22 PM

## 2014-01-03 NOTE — Progress Notes (Signed)
RN concern for change in swallow safety. MD verbal order to offer sips of water to patient and assess. Pt coughing following sips, RN suctioned mouth. MD made aware. Orders received.

## 2014-01-03 NOTE — Plan of Care (Signed)
Problem: Phase I Progression Outcomes Goal: Voiding-avoid urinary catheter unless indicated Outcome: Completed/Met Date Met:  01/03/14

## 2014-01-03 NOTE — Plan of Care (Signed)
Problem: Phase I Progression Outcomes Goal: Pain controlled with appropriate interventions Outcome: Completed/Met Date Met:  01/03/14     

## 2014-01-03 NOTE — Progress Notes (Signed)
Prolonged EEG completed; results pending

## 2014-01-03 NOTE — Progress Notes (Signed)
PA at the bedside, while assessing patient and having him to sit up patient started to have seizure like activity.  VSS. Patient following commands.Help arrived and rapid response RN called. Patient given 1mg  of ativan at 0820 and repeated 2 MG at 0829 due to continued activity. When patient asked to sit up, seizure like activity appeared to intensify. EEG called and will do test at bedside.  Will continue to monitor patient with freq vital and Neuro check. Sister at bedside and updated with what is going on. Vital signs remain stable.   9:00 AM Patient appears relaxed with no seizure like activity following interventions at this time. Patient states " I feel okay". Sister remaining at bedside. Vital signs remain stable.

## 2014-01-03 NOTE — Care Management Note (Unsigned)
    Page 1 of 1   01/03/2014     5:59:40 PM CARE MANAGEMENT NOTE 01/03/2014  Patient:  [name], [name]   Account Number:  [id number]  Date Initiated:  01/03/2014  Documentation initiated by:  Tomi Bamberger  Subjective/Objective Assessment:   dx fall, uti, seizure  admit - from rehabilitation facility, but lives alone.     Action/Plan:   speec eval   Anticipated DC Date:  01/05/2014   Anticipated DC Plan:  [place]  CM consult      Choice offered to / List presented to:             Status of service:  In process, will continue to follow Medicare Important Message given?  YES (If response is "NO", the following Medicare IM given date fields will be blank) Date Medicare IM given:  01/03/2014 Medicare IM given by:  Tomi Bamberger Date Additional Medicare IM given:   Additional Medicare IM given by:    Discharge Disposition:    Per UR Regulation:    If discussed at Long Length of Stay Meetings, dates discussed:    Comments:  01/03/14 [place] RN, BSN [phone] patient is from rehabilitation facility,  and lives alone. NCM will continue to follow for dc needs.

## 2014-01-03 NOTE — Significant Event (Signed)
Rapid Response Event Note  Overview: Time Called: 0807 Arrival Time: 0810 Event Type: Neurologic  Initial Focused Assessment: Per PA, patient was sitting on the side of the bed conversing with her.  When he turned his head to the left he began to convulse and was unresponsive for a couple of minutes.  He was incontinent of bladder. Upon my arrival he was lying flat in the bed, conversant. 173/83  SR 70-80  RR 20  O2 sat 96% on 2l [place]   Interventions: Dr Candiss Norse at bedside, 1mg  ativan given IV Assisted patient to sit upright in the bed.  He began twitching, arms and legs a little with his torso. After we laid him flat he continued to twitch. He was conversant during event.  2mg  ativan given IV Patient relaxed and conversant. 1000: EEG at bedside for prolonged EEG.  Assisted patient to sit on the side of the bed,  He was "figity" and had twitching of extremities.  He was able to converse during episode.  Lying flat he was relaxed.   RN to call if assistance needed Sister at bedside   Event Summary: Name of Physician Notified: Dr Candiss Norse at 0807  Name of Consulting Physician Notified: Aram Beecham at [place] in room and stabalized  Event End Time: 0845  Raliegh Ip

## 2014-01-04 DIAGNOSIS — I429 Cardiomyopathy, unspecified: Secondary | ICD-10-CM

## 2014-01-04 DIAGNOSIS — R258 Other abnormal involuntary movements: Secondary | ICD-10-CM

## 2014-01-04 DIAGNOSIS — R609 Edema, unspecified: Secondary | ICD-10-CM

## 2014-01-04 LAB — BASIC METABOLIC PANEL
Anion gap: 11 (ref 5–15)
BUN: 13 mg/dL (ref 6–23)
CALCIUM: 9.5 mg/dL (ref 8.4–10.5)
CO2: 29 meq/L (ref 19–32)
CREATININE: 1.14 mg/dL (ref 0.50–1.35)
Chloride: 101 mEq/L (ref 96–112)
GFR calc Af Amer: 70 mL/min — ABNORMAL LOW (ref >90)
GFR calc non Af Amer: 60 mL/min — ABNORMAL LOW (ref >90)
Glucose, Bld: 116 mg/dL — ABNORMAL HIGH (ref 70–99)
Potassium: 3.9 mEq/L (ref 3.7–5.3)
Sodium: 141 mEq/L (ref 137–147)

## 2014-01-04 LAB — GLUCOSE, CAPILLARY
GLUCOSE-CAPILLARY: 133 mg/dL — AB (ref 70–99)
Glucose-Capillary: 121 mg/dL — ABNORMAL HIGH (ref 70–99)
Glucose-Capillary: 169 mg/dL — ABNORMAL HIGH (ref 70–99)
Glucose-Capillary: 182 mg/dL — ABNORMAL HIGH (ref 70–99)

## 2014-01-04 LAB — URINE CULTURE: Colony Count: >=100000

## 2014-01-04 LAB — CBC
HCT: 43.8 % (ref 39.0–52.0)
HEMOGLOBIN: 14.5 g/dL (ref 13.0–17.0)
MCH: 31.3 pg (ref 26.0–34.0)
MCHC: 33.1 g/dL (ref 30.0–36.0)
MCV: 94.4 fL (ref 78.0–100.0)
Platelets: 286 10*3/uL (ref 150–400)
RBC: 4.64 MIL/uL (ref 4.22–5.81)
RDW: 13.7 % (ref 11.5–15.5)
WBC: 6.8 10*3/uL (ref 4.0–10.5)

## 2014-01-04 LAB — LACTIC ACID, PLASMA: Lactic Acid, Venous: 1.5 mmol/L (ref 0.5–2.2)

## 2014-01-04 MED ORDER — LORAZEPAM 2 MG/ML IJ SOLN
2.0000 mg | Freq: Once | INTRAMUSCULAR | Status: AC
  Administered 2014-01-04: 2 mg via INTRAVENOUS

## 2014-01-04 MED ORDER — CARVEDILOL 6.25 MG PO TABS
6.2500 mg | ORAL_TABLET | Freq: Two times a day (BID) | ORAL | Status: DC
  Administered 2014-01-04 – 2014-01-06 (×4): 6.25 mg via ORAL
  Filled 2014-01-04 (×6): qty 1

## 2014-01-04 MED ORDER — LORATADINE 10 MG PO TABS
10.0000 mg | ORAL_TABLET | Freq: Every day | ORAL | Status: DC
  Administered 2014-01-04 – 2014-01-06 (×3): 10 mg via ORAL
  Filled 2014-01-04 (×3): qty 1

## 2014-01-04 MED ORDER — HYDRALAZINE HCL 50 MG PO TABS
50.0000 mg | ORAL_TABLET | Freq: Three times a day (TID) | ORAL | Status: DC
  Administered 2014-01-04 – 2014-01-06 (×5): 50 mg via ORAL
  Filled 2014-01-04 (×9): qty 1

## 2014-01-04 MED ORDER — FLUTICASONE PROPIONATE 50 MCG/ACT NA SUSP
1.0000 | Freq: Every day | NASAL | Status: DC
  Administered 2014-01-04 – 2014-01-06 (×3): 1 via NASAL
  Filled 2014-01-04: qty 16

## 2014-01-04 MED ORDER — LEVETIRACETAM IN NACL 500 MG/100ML IV SOLN
500.0000 mg | Freq: Two times a day (BID) | INTRAVENOUS | Status: DC
  Administered 2014-01-04 – 2014-01-06 (×5): 500 mg via INTRAVENOUS
  Filled 2014-01-04 (×7): qty 100

## 2014-01-04 NOTE — Progress Notes (Signed)
PROGRESS NOTE  [name] GUR:[id number] DOB: [birth year] DOA: 01/02/2014 PCP: [name], MD  [name] is a 79 y.o. male with history of DM 2/IDDM, essential hypertension, CAD, HLD, BPH/UTI/kidney stones, anxiety, vertigo who was recently hospitalized 12/15/13-12/17/13 who presents to the ED for "seizure like" movements. He was extensively evaluated during his recent admission.  Results included negative orthostatic blood pressures, CTA head and symptoms were felt to be secondary to possible Mnire's disease. During that admission, he had one episode of similar involuntary generalized shaking during OT evaluation. As per neurology recommendations, he was discharged on low-dose Valium and followed up with ENT 3 days back and was told to have vertigo. While at SNF the day of admission, he had an episode of dizziness and then fall.  Subsequently the same morning he had an episode of convulsive shaking.  The patient had a similar shaking episode which was not consistent with chills or seizures in the ER.  He was awake during the episode.  He was not orthostatic on admission.  UA was suspicious for urinary tract infection and lactic acid elevated. CT head without acute findings, CTA chest negative for PE or acute findings.  Per family, patient had a fever of 101 prior to ED arrival.  Assessment/Plan:  Convulsive episodes Patient with multiple convulsive episodes involving arms/legs.  These usually happen when he sits up.  He has become incontinent of urine with at least one of these episodes.  The patient is awake and can slowly follow commands during the convulsions.  Appreciate Neuro consultation.  CT head negative.  Prolonged EEG negative for epileptic wave forms.  Neuro considering starting AED.  UTI U/A appears infected.  On Rocephin.  Culture pending shows gm- rods.  Final results and sensitivities are pending.    Early sepsis secondary to UTI Resolved.  DM II cbgs well  controlled in house on lantus and sliding scale. HGb A1c 7.5 on 10/1  Hypertension Moderately controlled.  Medications adjusted by Cardiology.  Currently on coreg, hydralazine, hctz, amlodipine, and lisinopril.  Per cards if orthostatic - would drop hydralazine.  Hacking cough, clear sputum with left sided lymph node. Patient states he has had this cough for several weeks. CXR unremarkable  Will give prn robitussin   Cardiomyopathy EF 35-40% by echo.  Needs orthostatics checked but patient is unable to sit/stand without convulsing. Likely chronic per Cards consultatation.  Outpatient lexiscan cardiolite recommended.  Please notify Cards when patient is being discharged.  BP medications changed (metoprolol to coreg).  Continue aspirin.  Lactic Acidosis Resolved with IVF.  Lactic acid 1.5 on 11/25. Due to Fall and convulsions    DVT Prophylaxis:  lovenox  Code Status: full Family Communication: Sister at bedside. Disposition Plan: SNF.  Patient normally lives at home alone.  His sister lives across the street.   Consultants:  Neurology  cardiology  Procedures:  EEG, Echo   Antibiotics: Anti-infectives    Start     Dose/Rate Route Frequency Ordered Stop   01/03/14 1000  cefTRIAXone (ROCEPHIN) 1 g in dextrose 5 % 50 mL IVPB - Premix     1 g100 mL/hr over 30 Minutes Intravenous Every 24 hours 01/02/14 1612     01/02/14 1245  cefTRIAXone (ROCEPHIN) 1 g in dextrose 5 % 50 mL IVPB     1 g100 mL/hr over 30 Minutes Intravenous  Once 01/02/14 1238 01/02/14 1344        HPI/Subjective: Patient has no complaints but requests  his "release papers".  His sister has bedside has numerous questions - What about left sided node on his neck?  Does he have peripheral neuropathy that is causing burning in his legs?  Have his neck arteries been checked?  Objective: Filed Vitals:   01/04/14 0500 01/04/14 0828 01/04/14 1112 01/04/14 1138  BP:  167/90 143/75 155/79  Pulse:  99 82 69    Temp:    97.5 F (36.4 C)  TempSrc:    Oral  Resp:  20 18 20   Height:      Weight: 104.146 kg (229 lb 9.6 oz)     SpO2:  97% 93% 94%    Intake/Output Summary (Last 24 hours) at 01/04/14 1406 Last data filed at 01/04/14 1009  Gross per 24 hour  Intake 2649.5 ml  Output   1550 ml  Net 1099.5 ml   Filed Weights   01/03/14 1838 01/04/14 0424 01/04/14 0500  Weight: 104.327 kg (230 lb) 104.332 kg (230 lb 0.2 oz) 104.146 kg (229 lb 9.6 oz)    Exam: General: Well developed, well nourished, NAD, appears stated age  73:  PERR, EOMI, Anicteic Sclera, MMM. No pharyngeal erythema or exudates  Neck: Supple, no JVD, +left anterior lymph node, soft, non-tender. Cardiovascular: RRR, S1 S2 auscultated, no rubs, murmurs or gallops.   Respiratory: Clear to auscultation bilaterally with equal chest rise  Abdomen: Soft, nontender, nondistended, + bowel sounds  Extremities: warm dry without cyanosis clubbing.  RLE slightly swollen in comparison to left.  Neuro: AAOx3, cranial nerves grossly intact. Strength 5/5 in upper and lower extremities.   Skin: Without rashes exudates or nodules.    Data Reviewed: Basic Metabolic Panel:  Recent Labs Lab 01/02/14 1039 01/04/14 0521  NA 141 141  K 3.7 3.9  CL 100 101  CO2 22 29  GLUCOSE 247* 116*  BUN 17 13  CREATININE 1.13 1.14  CALCIUM 9.7 9.5   Liver Function Tests:  Recent Labs Lab 01/02/14 1039  AST 22  ALT 34  ALKPHOS 66  BILITOT 0.5  PROT 6.9  ALBUMIN 3.9   CBC:  Recent Labs Lab 01/02/14 1039 01/04/14 0521  WBC 6.4 6.8  NEUTROABS 4.3  --   HGB 15.5 14.5  HCT 45.4 43.8  MCV 94.6 94.4  PLT 297 286   CBG:  Recent Labs Lab 01/03/14 1207 01/03/14 1655 01/03/14 2216 01/04/14 0807 01/04/14 1149  GLUCAP 142* 222* 167* 121* 169*    Recent Results (from the past 240 hour(s))  Urine culture     Status: None (Preliminary result)   Collection Time: 01/02/14 12:01 PM  Result Value Ref Range Status   Specimen  Description URINE, CATHETERIZED  Final   Special Requests NONE  Final   Culture  Setup Time   Final    01/02/2014 17:05 Performed at [place]   Final    >=100,000 COLONIES/ML Performed at [facility]    Culture   Final    [place] Performed at [facility]    Report Status PENDING  Incomplete  MRSA PCR Screening     Status: None   Collection Time: 01/02/14  4:18 PM  Result Value Ref Range Status   MRSA by PCR NEGATIVE NEGATIVE Final    Comment:        The GeneXpert MRSA Assay (FDA approved for NASAL specimens only), is one component of a comprehensive MRSA colonization surveillance program. It is not intended to  diagnose MRSA infection nor to guide or monitor treatment for MRSA infections.      Studies: No results found.  Scheduled Meds: . amLODipine  10 mg Oral q morning - 10a  . antiseptic oral rinse  7 mL Mouth Rinse BID  . aspirin EC  325 mg Oral Daily  . carvedilol  6.25 mg Oral BID WC  . cefTRIAXone (ROCEPHIN)  IV  1 g Intravenous Q24H  . diazepam  2.5 mg Oral BID  . enoxaparin (LOVENOX) injection  40 mg Subcutaneous Q24H  . hydrALAZINE  50 mg Oral 3 times per day  . hydrochlorothiazide  25 mg Oral Daily  . insulin aspart  0-15 Units Subcutaneous TID WC  . insulin aspart  0-5 Units Subcutaneous QHS  . insulin glargine  20 Units Subcutaneous QHS  . levETIRAcetam  500 mg Intravenous Q12H  . lisinopril  20 mg Oral Daily  . pravastatin  40 mg Oral QHS  . sodium chloride  3 mL Intravenous Q12H  . tamsulosin  0.4 mg Oral Daily   Continuous Infusions:    Principal Problem:   Involuntary movements Active Problems:   Anxiety state   Essential hypertension   Type 2 diabetes mellitus with vascular disease   UTI (urinary tract infection)   Syncope   UTI (lower urinary tract infection)   Sepsis    Karen Kitchens  [facility] Pager [phone]. If 7PM-7AM, please contact night-coverage  at [email or web address], password [facility] 01/04/2014, 2:06 PM  LOS: 2 days

## 2014-01-04 NOTE — Progress Notes (Signed)
NEURO HOSPITALIST PROGRESS NOTE   SUBJECTIVE:                                                                                                                        Resting comfortably in bed. Sustained another paroxysmal event this morning lasting approximately a minute, characterized by generalized tremor-myoclonic like activity involving mainly the upper body and preserved consciousness. The episode started when he was trying to sit up in bed, then became prominent while lying flat. Prolonged EEG monitoring yesterday: "The patient had 5 episodes of jerking/shaking during the recording. Three of these were with sitting. None of these events had epileptic correlate and no change in the background activity was noted". Recent MRI brain was unremarkable.  OBJECTIVE:                                                                                                                           Vital signs in last 24 hours: Temp:  [97.5 F (36.4 C)-98.3 F (36.8 C)] 97.5 F (36.4 C) (11/25 1138) Pulse Rate:  [61-99] 69 (11/25 1138) Resp:  [15-20] 20 (11/25 1138) BP: (112-167)/(63-90) 155/79 mmHg (11/25 1138) SpO2:  [91 %-98 %] 94 % (11/25 1138) Weight:  [104.146 kg (229 lb 9.6 oz)-104.332 kg (230 lb 0.2 oz)] 104.146 kg (229 lb 9.6 oz) (11/25 0500)  Intake/Output from previous day: 11/24 0701 - 11/25 0700 In: 1277.5 [P.O.:720; I.V.:557.5] Out: 1000 [Urine:1000] Intake/Output this shift: Total I/O In: 1372 [P.O.:222; I.V.:1150] Out: 550 [Urine:550] Nutritional status: Diet Carb Modified  Past Medical History  Diagnosis Date  . CAD (coronary artery disease)   . History of syncope   . HTN (hypertension)   . Borderline hyperlipidemia   . History of gastritis   . Diverticulosis of colon   . Colon polyps   . Hypertrophy of prostate with urinary obstruction and other lower urinary tract symptoms (LUTS)   . History of pyelonephritis   . DJD  (degenerative joint disease)   . Anxiety   . DM (diabetes mellitus)     Adult onset  . 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 History of  kidney stones    Neurologic Exam:  Will defer at this time, as patient sleeping.  Lab Results: Lab Results  Component Value Date/Time   CHOL 126 11/10/2013 09:33 AM   Lipid Panel No results for input(s): CHOL, TRIG, HDL, CHOLHDL, VLDL, LDLCALC in the last 72 hours.  Studies/Results: Ct Angio Chest Pe W/cm &/or Wo Cm  01/02/2014   CLINICAL DATA:  Syncopal episode.  Shortness of breath.  Hypoxia.  EXAM: CT ANGIOGRAPHY CHEST WITH CONTRAST  TECHNIQUE: Multidetector CT imaging of the chest was performed using the standard protocol during bolus administration of intravenous contrast. Multiplanar CT image reconstructions and MIPs were obtained to evaluate the vascular anatomy.  CONTRAST:  17mL OMNIPAQUE IOHEXOL 350 MG/ML SOLN  COMPARISON:  None.  FINDINGS: Vascular/Cardiac: Satisfactory opacification of pulmonary arteries is demonstated, and no pulmonary emboli are identified. No evidence of thoracic aortic aneurysm or other significant abnormality. Mild cardiomegaly noted.  Mediastinum/Hilar Regions: No masses or pathologically enlarged lymph nodes identified.  Lungs:  No pulmonary infiltrate or mass identified.  Pleura:  No evidence of effusion or mass.  Musculoskeletal:  No suspicious bone lesions identified.  Other:  None.  Review of the MIP images confirms the above findings.  IMPRESSION: No evidence of pulmonary embolism or other acute findings.  Mild cardiomegaly.   Electronically Signed   By: Earle Gell M.D.   On: 01/02/2014 13:24    MEDICATIONS                                                                                                                        Scheduled: . amLODipine  10 mg Oral q morning - 10a  . antiseptic oral rinse  7 mL Mouth Rinse BID  . aspirin EC  325 mg  Oral Daily  . carvedilol  6.25 mg Oral BID WC  . cefTRIAXone (ROCEPHIN)  IV  1 g Intravenous Q24H  . diazepam  2.5 mg Oral BID  . enoxaparin (LOVENOX) injection  40 mg Subcutaneous Q24H  . hydrALAZINE  50 mg Oral TID  . hydrochlorothiazide  25 mg Oral Daily  . insulin aspart  0-15 Units Subcutaneous TID WC  . insulin aspart  0-5 Units Subcutaneous QHS  . insulin glargine  20 Units Subcutaneous QHS  . levETIRAcetam  500 mg Intravenous Q12H  . lisinopril  20 mg Oral Daily  . pravastatin  40 mg Oral QHS  . sodium chloride  3 mL Intravenous Q12H  . tamsulosin  0.4 mg Oral Daily    ASSESSMENT/PLAN:  79 y/o with new onset perplexing paroxysmal events that typically occur while sitting up in bed and lying flat and characterized by generalized tremor-myoclonic like movements more prominent in the upper body, usually without alteration of consciousness but was noted to be incontinent of urine during one of such episodes. Prolonged EEG monitoring was able to capture 5 of his habitual events and there was not associated EEG correlate. Unclear if we are dealing with a paroxysmal hyperkinetic movement disorder or a focal onset seizures that are escaping EEG detection ( I don't see a semiology consistent with frontal lobe epilepsy but can not entirely exclude that possibility). Would like to give him a trial of a broad spectrum AED like keppra, but this patient requires further outpatient neurological evaluation and quite likely admission to an EMU for better clarification of his spells. Will sign off.   Dorian Pod, MD [facility] [phone]  01/04/2014, 12:32 PM

## 2014-01-04 NOTE — Consult Note (Signed)
CARDIOLOGY CONSULT NOTE  Patient ID: [name] MRN: [id number] DOB/AGE: [birth year] 79 y.o.  Admit date: 01/02/2014 Primary Physician: Dr. Damita Dunnings Primary Cardiologist: Former Mar Daring Reason for Consultation: Abnormal echo  HPI: 79 yo with history of HTN, nephrolithiasis, "dizziness," nonobstructive CAD, and mild cardiomyopathy was admitted after fall and presyncope.  Patient was in the hospital from 11/5-11/7 with "dizziness."  He had an extensive workup at that time with concern for Meniere's disease.  He was subsequently seen by ENT and simply given diagnosis of vertigo.  He had to go to a nursing home for rehab.  On 11/23, he was in the bathroom, and turned around.  With this maneuver, he got dizzy (?lightheaded) and fell, hitting his head. No loss of consciousness. There was an altered level of consciousness so he was brought to ER.  He was in sinus tachycardia and thought to be dehydrated/orthostatic.  He was given IV fluid.  UTI was found and antibiotics started.  Since admission, he has been noted to "shake" whenever he sits up.  He has been evaluated by neurology and had a negative EEG.  He had an echo done showing EF 35-40%, so cardiology was consulted.  Telemetry has shown NSR, no significant arrhythmias.   Prior to last hospitalization, patient says that he could walk on his own around the house without dyspnea.  No history of chest pain.   Review of systems complete and found to be negative unless listed above in HPI  Past Medical History: 1. Cardiomyopathy: History of mildly decreased LV systolic function.  6/04 LHC with mild nonobstructive CAD.  Cardiolite 3/10 with EF 47%, apical thinning no ischemia.  Echo 3/10 EF 50-55%, apical anterior hypokinesis.  Lexiscan Cardiolite 1/14 with EF 43%, apical thinning with no ischemia.  Echo (11/15) with EF 35-40%, mild LVH, diffuse hypokinesis.  2. HTN 3. Diverticulosis 4. Gastritis 5. BPH 6. Nephrolithiasis 7. Type II  diabetes 8. "Dizziness" of uncertain etiology, ?Meniere's disease.   Family History  Problem Relation Age of Onset  . Heart attack Mother   . Dementia Brother   . Colon cancer Neg Hx   . Prostate cancer Neg Hx     History   Social History  . Marital Status: Widowed    Spouse Name: N/A    Number of Children: 2  . Years of Education: N/A   Occupational History  . Retired    Social History Main Topics  . Smoking status: Never Smoker   . Smokeless tobacco: Never Used  . Alcohol Use: No  . Drug Use: No  . Sexual Activity: Not on file   Other Topics Concern  . Not on file   Social History Narrative   Widowed 2006   2 kids   Enjoys hunting and fishing   Retired from [employer] and [place] work     Prescriptions prior to admission  Medication Sig Dispense Refill Last Dose  . acetaminophen (TYLENOL) 650 MG CR tablet Take 650 mg by mouth every 4 (four) hours as needed for pain or fever.   Past Month at Unknown time  . amLODipine (NORVASC) 10 MG tablet Take 1 tablet (10 mg total) by mouth every morning. 90 tablet 3 01/01/2014 at Unknown time  . aspirin EC 81 MG tablet Take 81 mg by mouth daily.   01/01/2014 at Unknown time  . diazepam (VALIUM) 5 MG tablet Take 0.5 tablets (2.5 mg total) by mouth 2 (two) times daily. 30 tablet 0 01/01/2014 at  Unknown time  . glimepiride (AMARYL) 4 MG tablet Take 1 tablet (4 mg total) by mouth daily with breakfast. 90 tablet 3 01/01/2014 at Unknown time  . hydrALAZINE (APRESOLINE) 50 MG tablet Take 50 mg by mouth 3 (three) times daily.   01/01/2014 at Unknown time  . insulin aspart (NOVOLOG) 100 UNIT/ML injection Inject 4-15 Units into the skin 3 (three) times daily before meals.   Past Month at Unknown time  . Insulin Glargine ([name] SOLOSTAR) 300 UNIT/ML SOPN Inject 20 Units into the skin at bedtime.   01/01/2014 at Unknown time  . lisinopril-hydrochlorothiazide (PRINZIDE,ZESTORETIC) 20-25 MG per tablet Take 1 tablet by mouth daily.   01/01/2014 at  Unknown time  . metFORMIN (GLUCOPHAGE) 1000 MG tablet Take 1 tablet (1,000 mg total) by mouth 2 (two) times daily with a meal. 180 tablet 3 01/01/2014 at Unknown time  . metoprolol (LOPRESSOR) 50 MG tablet Take 1 tablet (50 mg total) by mouth 2 (two) times daily. 180 tablet 3 01/01/2014 at 9pm  . pravastatin (PRAVACHOL) 40 MG tablet Take 1 tablet (40 mg total) by mouth at bedtime. 90 tablet 3 01/01/2014 at Unknown time   Current Scheduled Meds: . amLODipine  10 mg Oral q morning - 10a  . antiseptic oral rinse  7 mL Mouth Rinse BID  . aspirin EC  325 mg Oral Daily  . carvedilol  6.25 mg Oral BID WC  . cefTRIAXone (ROCEPHIN)  IV  1 g Intravenous Q24H  . diazepam  2.5 mg Oral BID  . enoxaparin (LOVENOX) injection  40 mg Subcutaneous Q24H  . hydrALAZINE  50 mg Oral TID  . hydrochlorothiazide  25 mg Oral Daily  . insulin aspart  0-15 Units Subcutaneous TID WC  . insulin aspart  0-5 Units Subcutaneous QHS  . insulin glargine  20 Units Subcutaneous QHS  . lisinopril  20 mg Oral Daily  . pravastatin  40 mg Oral QHS  . sodium chloride  3 mL Intravenous Q12H  . tamsulosin  0.4 mg Oral Daily   Continuous Infusions:  PRN Meds:.acetaminophen **OR** acetaminophen, albuterol, guaiFENesin-dextromethorphan, ondansetron **OR** ondansetron (ZOFRAN) IV  Physical exam Blood pressure 143/75, pulse 82, temperature 98 F (36.7 C), temperature source Oral, resp. rate 18, height 6' (1.829 m), weight 229 lb 9.6 oz (104.146 kg), SpO2 93 %. General: NAD Neck: JVP 7-8 cm, no thyromegaly or thyroid nodule.  Lungs: Clear to auscultation bilaterally with normal respiratory effort. CV: Nondisplaced PMI.  Heart regular S1/S2, no S3/S4, no murmur.  No peripheral edema.  No carotid bruit.  Normal pedal pulses.  Abdomen: Soft, nontender, no hepatosplenomegaly, no distention.  Skin: Intact without lesions or rashes.  Neurologic: Alert and oriented x 3.  Psych: Normal affect. Extremities: No clubbing or cyanosis.   HEENT: Normal.   Labs:   Lab Results  Component Value Date   WBC 6.8 01/04/2014   HGB 14.5 01/04/2014   HCT 43.8 01/04/2014   MCV 94.4 01/04/2014   PLT 286 01/04/2014    Recent Labs Lab 01/02/14 1039 01/04/14 0521  NA 141 141  K 3.7 3.9  CL 100 101  CO2 22 29  BUN 17 13  CREATININE 1.13 1.14  CALCIUM 9.7 9.5  PROT 6.9  --   BILITOT 0.5  --   ALKPHOS 66  --   ALT 34  --   AST 22  --   GLUCOSE 247* 116*     Radiology: - CTA chest: No PNA or PE  EKG: Sinus tachy  at 131, inferior Qs, poor RWP  ASSESSMENT AND PLAN: 79 yo with history of HTN, nephrolithiasis, "dizziness," nonobstructive CAD, and mild cardiomyopathy was admitted after fall and presyncope.  Echo was done, showing EF 35-40%.   1. Cardiomyopathy: EF 35-40% by echo.  Cardiolites from 2010 and 2014 showed EF in the 40s, so it does appear that he has had a mild chronic cardiomyopathy.  I do not think that his current presentation is likely to have much to do with the decreased EF, suspect this is probably relatively chronic.  No recent chest pain or dyspnea.  He may have underlying CAD, had nonobstructive CAD on cath in 2004.  However, doubt he is having ischemic symptoms currently.  I also do not think that his symptoms are related to arrhythmia.  - Would change metoprolol to Coreg 6.25 mg bid.  Continue lisinopril.  - Should continue ASA 81 daily, would consider statin initiation but can be done as outpatient.  - I think it would be reasonable to get an outpatient Lexiscan Cardiolite, please notify us when he will go home.  2. "Dizziness" and shaking when he sits up: Extensive workup.  Negative EEG.  Telemetry here has not shown arrhythmias.  Possible orthostatic hypotension, however, orthostatics not done yet because he starts shaking violently whenever he sits up.  UTI likely playing a role here as well, he has GNRs in urine.  - Would try again to check orthostatics.  He is on a lot of BP meds . Would start cutting  back if he is found to be orthostatic.  In bed, SBP in 120s.  Would start with cutting back hydralazine, HCTZ and amlodipine as beta blocker and ACEI are helpful with cardiomyopathy.   We will see as needed.  Please call when he is going home so outpatient Cardiolite can be arranged.   Loralie Champagne 01/04/2014 11:37 AM

## 2014-01-04 NOTE — Progress Notes (Signed)
*  PRELIMINARY RESULTS* Vascular Ultrasound Right lower extremity venous duplex has been completed.  Preliminary findings: no evidence of DVT.   Landry Mellow, RDMS, RVT  01/04/2014, 3:12 PM

## 2014-01-04 NOTE — Progress Notes (Signed)
RN called to bedside for seizure like activity noted by NT after bath (patient sitting at bedside). RN noted patient in bed, head flat, patient shaking, responsive and following commands. Dr. Candiss Norse notified, verbal order to give 2mg  ativan stat. Dr. Candiss Norse at bedside to assess patient during episode. Vital signs stable.   8:33 AM shaking appears resolved at this time. Vital signs remain stable. Pt resting in bed. Bedrest order placed.

## 2014-01-05 LAB — GLUCOSE, CAPILLARY
GLUCOSE-CAPILLARY: 115 mg/dL — AB (ref 70–99)
GLUCOSE-CAPILLARY: 145 mg/dL — AB (ref 70–99)
GLUCOSE-CAPILLARY: 137 mg/dL — AB (ref 70–99)
Glucose-Capillary: 148 mg/dL — ABNORMAL HIGH (ref 70–99)

## 2014-01-05 MED ORDER — GABAPENTIN 100 MG PO CAPS
200.0000 mg | ORAL_CAPSULE | Freq: Three times a day (TID) | ORAL | Status: DC
  Administered 2014-01-05 (×3): 200 mg via ORAL
  Filled 2014-01-05 (×6): qty 2

## 2014-01-05 MED ORDER — LORAZEPAM 2 MG/ML IJ SOLN
INTRAMUSCULAR | Status: AC
  Filled 2014-01-05: qty 1

## 2014-01-05 MED ORDER — LORAZEPAM 2 MG/ML IJ SOLN
2.0000 mg | INTRAMUSCULAR | Status: DC | PRN
  Administered 2014-01-05 – 2014-01-06 (×3): 2 mg via INTRAVENOUS
  Filled 2014-01-05 (×2): qty 1

## 2014-01-05 NOTE — Progress Notes (Addendum)
PROGRESS NOTE  [name] QJJ:[id number] DOB: Aug 25, 1934 DOA: 01/02/2014 PCP: [name], MD  [name] is a 79 y.o. male with history of DM 2/IDDM, essential hypertension, CAD, HLD, BPH/UTI/kidney stones, anxiety, vertigo who was recently hospitalized 12/15/13-12/17/13 who presents to the ED for "seizure like" movements. He was extensively evaluated during his recent admission.  Results included negative orthostatic blood pressures, CTA head and symptoms were felt to be secondary to possible Mnire's disease. During that admission, he had one episode of similar involuntary generalized shaking during OT evaluation. As per neurology recommendations, he was discharged on low-dose Valium and followed up with ENT 3 days back and was told to have vertigo. While at SNF the day of admission, he had an episode of dizziness and then fall.  Subsequently the same morning he had an episode of convulsive shaking.  The patient had a similar shaking episode which was not consistent with chills or seizures in the ER.  He was awake during the episode.  He was not orthostatic on admission.  UA was suspicious for urinary tract infection and lactic acid elevated. CT head without acute findings, CTA chest negative for PE or acute findings.  Per family, patient had a fever of 101 prior to ED arrival.  Assessment/Plan:  Myoclonic Convulsive episodes Head one episode with urinary incontinence, likely is myoclonic convulsion worse with sitting up and movements, EEG however unremarkable, neuro following discussed with Dr Aram Beecham, placed on Keppra, somewhat improved clinically. Continue to monitor.   UTI with Sepsis Cultures noted continue Rocephin to complete 5 days.   - Sepsis resolved after supportive care.     DM II cbgs well controlled in house on lantus and sliding scale. HGb A1c 7.5 on 10/1  CBG (last 3)   Recent Labs  01/04/14 1707 01/04/14 2156 01/05/14 0812  GLUCAP 182* 133* 148*       Hypertension Moderately controlled.  Medications adjusted by Cardiology.  Currently on coreg, hydralazine, hctz, amlodipine, and lisinopril.  Per cards if orthostatic - would drop hydralazine.   Hacking cough, clear sputum with left sided lymph node. Patient states he has had this cough for several weeks. CXR unremarkable  Will give prn robitussin    Chronic systolic heart failure.  Compensated EF 35-40% by echo.    Likely chronic per Cards consultatation.  Outpatient lexiscan cardiolite recommended.  Please notify Cards when patient is being discharged. on aspirin and ACE inhibitor and now placed on Coreg    Lactic Acidosis Resolved with IVF.  Lactic acid 1.5 on 11/25. Due to Fall and convulsions   Peripheral neuropathy.  placed on Neurontin    DVT Prophylaxis:  lovenox  Code Status: full Family Communication: Sister at bedside. Disposition Plan: SNF.  Patient normally lives at home alone.  His sister lives across the street.   Consultants:  Neurology  cardiology  Procedures:  EEG, Echo   Antibiotics: Anti-infectives    Start     Dose/Rate Route Frequency Ordered Stop   01/03/14 1000  cefTRIAXone (ROCEPHIN) 1 g in dextrose 5 % 50 mL IVPB - Premix     1 g100 mL/hr over 30 Minutes Intravenous Every 24 hours 01/02/14 1612     01/02/14 1245  cefTRIAXone (ROCEPHIN) 1 g in dextrose 5 % 50 mL IVPB     1 g100 mL/hr over 30 Minutes Intravenous  Once 01/02/14 1238 01/02/14 1344        HPI/Subjective: Patient in bed, denies any headache chest pain  or belly pain. Jerky movements have improved. No weakness.   Objective: Filed Vitals:   01/05/14 0025 01/05/14 0435 01/05/14 0953 01/05/14 1052  BP: 137/68 132/67 138/81 151/73  Pulse: 71 70  69  Temp: 98.6 F (37 C) 98.5 F (36.9 C)  98.4 F (36.9 C)  TempSrc: Oral Oral  Oral  Resp: 20 18  20   Height:      Weight:  105.235 kg (232 lb)    SpO2: 96% 92%  94%    Intake/Output Summary (Last 24 hours)  at 01/05/14 1141 Last data filed at 01/05/14 0906  Gross per 24 hour  Intake    422 ml  Output   1150 ml  Net   -728 ml   Filed Weights   01/04/14 0424 01/04/14 0500 01/05/14 0435  Weight: 104.332 kg (230 lb 0.2 oz) 104.146 kg (229 lb 9.6 oz) 105.235 kg (232 lb)    Exam: General: Well developed, well nourished, NAD, appears stated age  46:  PERR, EOMI, Anicteic Sclera, MMM. No pharyngeal erythema or exudates  Neck: Supple, no JVD, +left anterior lymph node, soft, non-tender. Cardiovascular: RRR, S1 S2 auscultated, no rubs, murmurs or gallops.   Respiratory: Clear to auscultation bilaterally with equal chest rise  Abdomen: Soft, nontender, nondistended, + bowel sounds  Extremities: warm dry without cyanosis clubbing.  RLE slightly swollen in comparison to left.  Neuro: AAOx3, cranial nerves grossly intact. Strength 5/5 in upper and lower extremities.   Skin: Without rashes exudates or nodules.    Data Reviewed: Basic Metabolic Panel:  Recent Labs Lab 01/02/14 1039 01/04/14 0521  NA 141 141  K 3.7 3.9  CL 100 101  CO2 22 29  GLUCOSE 247* 116*  BUN 17 13  CREATININE 1.13 1.14  CALCIUM 9.7 9.5   Liver Function Tests:  Recent Labs Lab 01/02/14 1039  AST 22  ALT 34  ALKPHOS 66  BILITOT 0.5  PROT 6.9  ALBUMIN 3.9   CBC:  Recent Labs Lab 01/02/14 1039 01/04/14 0521  WBC 6.4 6.8  NEUTROABS 4.3  --   HGB 15.5 14.5  HCT 45.4 43.8  MCV 94.6 94.4  PLT 297 286   CBG:  Recent Labs Lab 01/04/14 0807 01/04/14 1149 01/04/14 1707 01/04/14 2156 01/05/14 0812  GLUCAP 121* 169* 182* 133* 148*    Recent Results (from the past 240 hour(s))  Urine culture     Status: None   Collection Time: 01/02/14 12:01 PM  Result Value Ref Range Status   Specimen Description URINE, CATHETERIZED  Final   Special Requests NONE  Final   Culture  Setup Time   Final    01/02/2014 17:05 Performed at [facility]   Final    >=100,000  COLONIES/ML Performed at [place] Performed at [facility]    Report Status 01/04/2014 FINAL  Final   Organism ID, Bacteria CITROBACTER FREUNDII  Final      Susceptibility   Citrobacter freundii - MIC*    CEFAZOLIN RESISTANT      CEFTRIAXONE <=1 SENSITIVE Sensitive     CIPROFLOXACIN 0.5 SENSITIVE Sensitive     GENTAMICIN <=1 SENSITIVE Sensitive     LEVOFLOXACIN 1 SENSITIVE Sensitive     NITROFURANTOIN <=16 SENSITIVE Sensitive     TOBRAMYCIN <=1 SENSITIVE Sensitive     TRIMETH/SULFA <=20 SENSITIVE Sensitive     PIP/TAZO <=4 SENSITIVE Sensitive     *  CITROBACTER FREUNDII  MRSA PCR Screening     Status: None   Collection Time: 01/02/14  4:18 PM  Result Value Ref Range Status   MRSA by PCR NEGATIVE NEGATIVE Final    Comment:        The GeneXpert MRSA Assay (FDA approved for NASAL specimens only), is one component of a comprehensive MRSA colonization surveillance program. It is not intended to diagnose MRSA infection nor to guide or monitor treatment for MRSA infections.      Studies: No results found.  Scheduled Meds: . amLODipine  10 mg Oral q morning - 10a  . antiseptic oral rinse  7 mL Mouth Rinse BID  . aspirin EC  325 mg Oral Daily  . carvedilol  6.25 mg Oral BID WC  . cefTRIAXone (ROCEPHIN)  IV  1 g Intravenous Q24H  . diazepam  2.5 mg Oral BID  . enoxaparin (LOVENOX) injection  40 mg Subcutaneous Q24H  . fluticasone  1 spray Each Nare Daily  . gabapentin  200 mg Oral TID  . hydrALAZINE  50 mg Oral 3 times per day  . hydrochlorothiazide  25 mg Oral Daily  . insulin aspart  0-15 Units Subcutaneous TID WC  . insulin aspart  0-5 Units Subcutaneous QHS  . insulin glargine  20 Units Subcutaneous QHS  . levETIRAcetam  500 mg Intravenous Q12H  . lisinopril  20 mg Oral Daily  . loratadine  10 mg Oral Daily  . pravastatin  40 mg Oral QHS  . sodium chloride  3 mL Intravenous Q12H  . tamsulosin  0.4 mg  Oral Daily   Continuous Infusions:    Principal Problem:   Involuntary movements Active Problems:   Anxiety state   Essential hypertension   Type 2 diabetes mellitus with vascular disease   UTI (urinary tract infection)   Syncope   UTI (lower urinary tract infection)   Sepsis    Lala Lund K M.D on 01/05/2014 at 11:41 AM  Between 7am to 7pm - Pager - [phone], After 7pm go to [email or web address] - password [email or web address]  And look for the night coverage person covering me after hours  [facility]  Office  [phone]

## 2014-01-05 NOTE — Progress Notes (Signed)
Called to room by daughter for help. Patient sitting on side of bed holding urinal. Laid him back into the bed where he continued to tense up and shake for 12 minutes. Vitals signs taken - B/P and PR high. Patient able to answer a couple of questions during the 12 minutes also able to grab a hold of the bed frame. Dr. Candiss Norse notified medication given. Will continue to monitor.

## 2014-01-05 NOTE — Progress Notes (Signed)
Called to patients room for patient with seizure activity.  Upon my arrival family and RN at bedside.  Patient is having full body seizure like activity, patient able to answer some questions has purposeful movement and gripping side rails.  Vitals OK.  MD updated.  Primary RN administered ativan.  Rn to call if assistance needed

## 2014-01-05 NOTE — Progress Notes (Signed)
Chaplain met pt's sister in hallway who requested visit.  Chaplain is former Conservation officer, historic buildings at [employer].  Pt had difficulty waking up and speaking.  Pt also did not desire food.  Pt's sister says pt has been agitated as of late and "not acting like himself."  From my prior knowledge of pt, he does not appear to be anywhere near baseline regarding personality and spirituality.  When pt moved in bed he appeared to wince in pain.  Chaplain provided emotional and spiritual support as well as the ministry of advocacy and presence.  Chaplain will follow up with information on advanced directive if pt is more alert and oriented later.     01/05/14 1400  Clinical Encounter Type  Visited With Patient and family together  Visit Type Initial;Spiritual support;Social support  Referral From Family  Spiritual Encounters  Spiritual Needs Prayer;Ritual;Emotional  Stress Factors  Patient Stress Factors Exhausted;Health changes  Family Stress Factors Family relationships;Health changes  Advance Directives (For Healthcare)  Does patient have an advance directive? No  Mertie Moores, Chaplain

## 2014-01-05 NOTE — Progress Notes (Signed)
MD notified earlier this evening about patient's seizure like activity, prn ativan 2mg  IV given. Will continue to monitor.

## 2014-01-06 ENCOUNTER — Inpatient Hospital Stay (HOSPITAL_COMMUNITY): Payer: Medicare Other

## 2014-01-06 DIAGNOSIS — I10 Essential (primary) hypertension: Secondary | ICD-10-CM

## 2014-01-06 LAB — GLUCOSE, CAPILLARY
GLUCOSE-CAPILLARY: 137 mg/dL — AB (ref 70–99)
GLUCOSE-CAPILLARY: 136 mg/dL — AB (ref 70–99)

## 2014-01-06 MED ORDER — GABAPENTIN 300 MG PO CAPS
300.0000 mg | ORAL_CAPSULE | Freq: Three times a day (TID) | ORAL | Status: DC
  Administered 2014-01-06: 300 mg via ORAL
  Filled 2014-01-06 (×3): qty 1

## 2014-01-06 NOTE — Progress Notes (Signed)
Speech Language Pathology Treatment: Dysphagia  Patient Details Name: [name] MRN: [id number] DOB: 1935-01-06 Today's Date: 01/06/2014 Time: 4847-2072 SLP Time Calculation (min) (ACUTE ONLY): 9 min  Assessment / Plan / Recommendation Clinical Impression  Pt continues to demonstrate normal swallow function, no signs of aspiration observed. Basic precautions reinforced with pt and family. No further SLP needs, will sign off.    HPI HPI: [name] is a 79 y.o. male with history of DM 2/IDDM, essential hypertension, CAD, HLD, BPH/UTI/kidney stones, anxiety, vertigo who was recently hospitalized 12/15/13-12/17/13. He was extensively evaluated including negative orthostatic blood pressures, CTA head and symptoms were felt to be secondary to possible Mnire's disease. During that admission, he had one episode of similar involuntary generalized shaking during OT evaluation. As per neurology recommendations, he was discharged on low-dose Valium and followed up with ENT 3 days back and was told to have vertigo. While at SNF the day of admission, he had an episode of dizziness and then fall.  Subsequently the same morning he had an episode of convulsive shaking. RN concern for change in safe swallow due to patient coughing following sips of thin liquids.    Pertinent Vitals Pain Assessment: No/denies pain  SLP Plan  All goals met;Discharge SLP treatment due to (comment)    Recommendations Diet recommendations: Regular;Thin liquid Liquids provided via: Cup;Straw Medication Administration: Whole meds with liquid Supervision: Patient able to self feed;Intermittent supervision to cue for compensatory strategies Compensations: Slow rate;Small sips/bites Postural Changes and/or Swallow Maneuvers: Seated upright 90 degrees;Upright 30-60 min after meal              Oral Care Recommendations: Oral care BID Follow up Recommendations: None Plan: All goals met;Discharge SLP treatment due to  (comment)    GO    [name], MA CCC-SLP [phone]  [name] 01/06/2014, 8:23 AM

## 2014-01-06 NOTE — Progress Notes (Signed)
Patient had seizure like activity during breakfast at 0900. Activity lasted less than one minute. Pt. Was was moved from a sitting to lying position in bed and given IV ativan 2mg . Vitals WDL and call bell within reach. Family at bedside and MD notified. Will continue to monitor.

## 2014-01-06 NOTE — Progress Notes (Signed)
PROGRESS NOTE  EDWEN Mathews KPT:[id number] DOB: 01-20-1935 DOA: 01/02/2014 PCP: [name], MD  [name] is a 79 y.o. male with history of DM 2/IDDM, essential hypertension, CAD, HLD, BPH/UTI/kidney stones, anxiety, vertigo who was recently hospitalized 12/15/13-12/17/13 who presents to the ED for "seizure like" movements. He was extensively evaluated during his recent admission.  Results included negative orthostatic blood pressures, CTA head and symptoms were felt to be secondary to possible Mnire's disease. During that admission, he had one episode of similar involuntary generalized shaking during OT evaluation. As per neurology recommendations, he was discharged on low-dose Valium and followed up with ENT 3 days back and was told to have vertigo. While at SNF the day of admission, he had an episode of dizziness and then fall.  Subsequently the same morning he had an episode of convulsive shaking.  The patient had a similar shaking episode which was not consistent with chills or seizures in the ER.  He was awake during the episode.  He was not orthostatic on admission.  UA was suspicious for urinary tract infection and lactic acid elevated. CT head without acute findings, CTA chest negative for PE or acute findings.  Per family, patient had a fever of 101 prior to ED arrival.  Assessment/Plan:  Myoclonic Convulsive episodes Had one episode with urinary incontinence on day 1, likely is myoclonic convulsion initiated with sitting up and movements, recent MRI and EEG unremarkable, neuro following discussed with Dr Aram Beecham, placed on Keppra, somewhat improved clinically. From 5-6 episodes a day down to one episode yesterday on Keppra, today able to sit up and increase activity without discomfort. Requested new low to see the patient again.    UTI with Sepsis Cultures noted continue Rocephin to complete 5 days.   - Sepsis resolved after supportive care.      DM II cbgs well  controlled in house on lantus and sliding scale. HGb A1c 7.5 on 10/1  CBG (last 3)   Recent Labs  01/05/14 1709 01/05/14 2204 01/06/14 0754  GLUCAP 145* 137* 137*      Hypertension Moderately controlled.  Medications adjusted by Cardiology.  Currently on coreg, hydralazine, hctz, amlodipine, and lisinopril.  Per cards if orthostatic - would drop hydralazine.   Hacking cough, clear sputum with left sided lymph node. Almost completely resolved with supportive care, chest x-ray unremarkable.    Chronic systolic heart failure.  Compensated, EF 35-40% by echo.    Likely chronic per Cards consultatation.  Outpatient lexiscan cardiolite recommended.  Please notify Cards when patient is being discharged. On aspirin and ACE inhibitor and now placed on Coreg as well.    Lactic Acidosis Resolved with IVF.  Lactic acid 1.5 on 11/25. Due to Fall and convulsions   Peripheral neuropathy.  placed on Neurontin    Family told me that he had a fall several months ago and possibly hurt his lower back.  Family requested L-spine x-ray which have been ordered, no point tenderness on exam, patient denies any lower back injury or back pain.    Chronic right lower extremity swelling.  Minimal, venous duplex negative.       DVT Prophylaxis:  lovenox  Code Status: full  Family Communication: Sister at bedside daily.  Daughter bedside on 01/06/2014. Answered daughters and sisters questions for over 20 minutes, explained the whole process, explain that his episodes are down from 7-8 a day to 1 a day on Keppra and likely is improving, explained to them that  so far MRI EEG negative and neurology is closely following. Daughter continuously siting frustration on the care she is given. She was offered to have patient transferred to a tertiary care facility if that makes them more satisfied. They will let me know about their decision in finding the appropriate facility as  desired.   Disposition Plan: SNF.  Patient normally lives at home alone.  His sister lives across the street.    Consultants:  Neurology  Cardiology  Procedures:  EEG, Echo   EEG This is a normal awake and drowsy prolonged video EEG monitoring. Three episodes of wooziness were captures as well as five episodes of jerking/shaking. No change in the background rhythm was noted with any of these episodes. No epileptiform activity was noted.    Antibiotics: Anti-infectives    Start     Dose/Rate Route Frequency Ordered Stop   01/03/14 1000  cefTRIAXone (ROCEPHIN) 1 g in dextrose 5 % 50 mL IVPB - Premix     1 g100 mL/hr over 30 Minutes Intravenous Every 24 hours 01/02/14 1612     01/02/14 1245  cefTRIAXone (ROCEPHIN) 1 g in dextrose 5 % 50 mL IVPB     1 g100 mL/hr over 30 Minutes Intravenous  Once 01/02/14 1238 01/02/14 1344        HPI/Subjective:  Patient in bed, denies any headache chest pain or belly pain. Jerky movements have improved. No back pain, no focal weakness.   Objective:  Filed Vitals:   01/06/14 0205 01/06/14 0611 01/06/14 0757 01/06/14 0803  BP: 125/58 148/71 118/58 137/73  Pulse:  75 77 79  Temp: 97.7 F (36.5 C) 97.9 F (36.6 C) 97.9 F (36.6 C)   TempSrc: Oral Oral Oral   Resp: 18 22 20    Height:      Weight:      SpO2: 94% 93% 92%     Intake/Output Summary (Last 24 hours) at 01/06/14 0901 Last data filed at 01/06/14 6378  Gross per 24 hour  Intake    300 ml  Output   2250 ml  Net  -1950 ml   Filed Weights   01/04/14 0424 01/04/14 0500 01/05/14 0435  Weight: 104.332 kg (230 lb 0.2 oz) 104.146 kg (229 lb 9.6 oz) 105.235 kg (232 lb)    Exam: General: Well developed, well nourished, NAD, appears stated age  20:  PERR, EOMI, Anicteic Sclera, MMM. No pharyngeal erythema or exudates  Neck: Supple, no JVD, +left anterior lymph node, soft, non-tender. Cardiovascular: RRR, S1 S2 auscultated, no rubs, murmurs or gallops.   Respiratory:  Clear to auscultation bilaterally with equal chest rise  Abdomen: Soft, nontender, nondistended, + bowel sounds  Extremities: warm dry without cyanosis clubbing.    Neuro: AAOx3, cranial nerves grossly intact. Strength 5/5 in upper and lower extremities.   Skin: Without rashes exudates or nodules.    Data Reviewed: Basic Metabolic Panel:  Recent Labs Lab 01/02/14 1039 01/04/14 0521  NA 141 141  K 3.7 3.9  CL 100 101  CO2 22 29  GLUCOSE 247* 116*  BUN 17 13  CREATININE 1.13 1.14  CALCIUM 9.7 9.5   Liver Function Tests:  Recent Labs Lab 01/02/14 1039  AST 22  ALT 34  ALKPHOS 66  BILITOT 0.5  PROT 6.9  ALBUMIN 3.9   CBC:  Recent Labs Lab 01/02/14 1039 01/04/14 0521  WBC 6.4 6.8  NEUTROABS 4.3  --   HGB 15.5 14.5  HCT 45.4 43.8  MCV 94.6 94.4  PLT 297 286   CBG:  Recent Labs Lab 01/05/14 0812 01/05/14 1153 01/05/14 1709 01/05/14 2204 01/06/14 0754  GLUCAP 148* 115* 145* 137* 137*    Recent Results (from the past 240 hour(s))  Urine culture     Status: None   Collection Time: 01/02/14 12:01 PM  Result Value Ref Range Status   Specimen Description URINE, CATHETERIZED  Final   Special Requests NONE  Final   Culture  Setup Time   Final    01/02/2014 17:05 Performed at [place]   Final    >=100,000 COLONIES/ML Performed at [place] Performed at [facility]    Report Status 01/04/2014 FINAL  Final   Organism ID, Bacteria CITROBACTER FREUNDII  Final      Susceptibility   Citrobacter freundii - MIC*    CEFAZOLIN RESISTANT      CEFTRIAXONE <=1 SENSITIVE Sensitive     CIPROFLOXACIN 0.5 SENSITIVE Sensitive     GENTAMICIN <=1 SENSITIVE Sensitive     LEVOFLOXACIN 1 SENSITIVE Sensitive     NITROFURANTOIN <=16 SENSITIVE Sensitive     TOBRAMYCIN <=1 SENSITIVE Sensitive     TRIMETH/SULFA <=20 SENSITIVE Sensitive     PIP/TAZO <=4 SENSITIVE Sensitive     *  CITROBACTER FREUNDII  MRSA PCR Screening     Status: None   Collection Time: 01/02/14  4:18 PM  Result Value Ref Range Status   MRSA by PCR NEGATIVE NEGATIVE Final    Comment:        The GeneXpert MRSA Assay (FDA approved for NASAL specimens only), is one component of a comprehensive MRSA colonization surveillance program. It is not intended to diagnose MRSA infection nor to guide or monitor treatment for MRSA infections.      Studies: No results found.  Scheduled Meds: . amLODipine  10 mg Oral q morning - 10a  . antiseptic oral rinse  7 mL Mouth Rinse BID  . aspirin EC  325 mg Oral Daily  . carvedilol  6.25 mg Oral BID WC  . cefTRIAXone (ROCEPHIN)  IV  1 g Intravenous Q24H  . diazepam  2.5 mg Oral BID  . enoxaparin (LOVENOX) injection  40 mg Subcutaneous Q24H  . fluticasone  1 spray Each Nare Daily  . gabapentin  300 mg Oral TID  . hydrALAZINE  50 mg Oral 3 times per day  . hydrochlorothiazide  25 mg Oral Daily  . insulin aspart  0-15 Units Subcutaneous TID WC  . insulin aspart  0-5 Units Subcutaneous QHS  . insulin glargine  20 Units Subcutaneous QHS  . levETIRAcetam  500 mg Intravenous Q12H  . lisinopril  20 mg Oral Daily  . loratadine  10 mg Oral Daily  . pravastatin  40 mg Oral QHS  . sodium chloride  3 mL Intravenous Q12H  . tamsulosin  0.4 mg Oral Daily   Continuous Infusions:    Principal Problem:   Involuntary movements Active Problems:   Anxiety state   Essential hypertension   Type 2 diabetes mellitus with vascular disease   UTI (urinary tract infection)   Syncope   UTI (lower urinary tract infection)   Sepsis    Lala Lund K M.D on 01/06/2014 at 9:01 AM  Between 7am to 7pm - Pager - [phone], After 7pm go to [email or web address] - password [email or web address]  And look for the night coverage person covering me after hours  [place]  [phone]

## 2014-01-06 NOTE — Discharge Summary (Signed)
Patient Demographics  [name]   QMV:[id number]  MWU:[id number]  is a 79 y.o. male  DOB - 01-06-1935  01/02/2014  Transfer Date 01/06/2014  Admitting Physician Modena Jansky, MD  Outpatient Primary MD for the patient is [name], MD Place of Transfer [facility] Accepting MD [facility] Condition Stable  Admission Diagnosis  UTI (lower urinary tract infection) [N39.0]  Discharge Diagnosis    Myoclonus Jerks  Past Medical History  Diagnosis Date  . CAD (coronary artery disease)   . History of syncope   . HTN (hypertension)   . Borderline hyperlipidemia   . History of gastritis   . Diverticulosis of colon   . Colon polyps   . Hypertrophy of prostate with urinary obstruction and other lower urinary tract symptoms (LUTS)   . History of pyelonephritis   . DJD (degenerative joint disease)   . Anxiety   . DM (diabetes mellitus)     Adult onset  . 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 History of kidney stones     Past Surgical History  Procedure Laterality Date  . Appendectomy    . Cataract extraction      RIGHT EYE  . Knee surgery      bilateral ARTHROSCOPY X2 TO EACH KNEE  . Shoulder surgery  04/2005    left by Dr. Berenice Primas  . 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 S/p cysto & stents for kidnedy stone 11/08 by dr. Reece Agar    . Cystoscopy  04/17/11    stent placed  . Cystoscopy w/ ureteral stent placement  04/17/2011    Procedure: CYSTOSCOPY WITH RETROGRADE PYELOGRAM/URETERAL STENT PLACEMENT;  Surgeon: Hanley Ben, MD;  Location: [facility];  Service: Urology;  Laterality: Left;  . Cystoscopy with retrograde pyelogram, ureteroscopy and stent placement Left 04/18/2013    Procedure: CYSTOSCOPY WITH LEFT RETROGRADE PYELOGRAM, LEFT URETEROSCOPY AND LASER LITHOTRIPSY LEFT STENT PLACEMENT;  Surgeon: Dutch Gray, MD;   Location: [facility];  Service: Urology;  Laterality: Left;  . Holmium laser application Left 08/11/5364    Procedure: HOLMIUM LASER APPLICATION;  Surgeon: Dutch Gray, MD;  Location: [facility];  Service: Urology;  Laterality: Left;     Consults  Neuro, Cards   Significant Tests    EEG  EEG This is a normal awake and drowsy prolonged video EEG monitoring. Three episodes of wooziness were captures as well as five episodes of jerking/shaking. No change in the background rhythm was noted with any of these episodes. No epileptiform activity was noted.   TTE  Left ventricle: The cavity size was normal. Wall thickness was increased in a pattern of mild LVH. Systolic function was moderately reduced. The estimated ejection fraction was in the range of 35% to 40%. Diffuse hypokinesis. Doppler parameters are consistent with abnormal left ventricular relaxation (grade 1 diastolic dysfunction). Doppler parameters are consistent with high ventricular filling pressure.    Ct Angio Head W/cm &/or Wo Cm  12/16/2013   CLINICAL DATA:  79 year old male with hearing loss presenting with sudden onset of dizziness and vertigo beginning 12/15/2013. MRI negative for acute stroke.  EXAM: CT ANGIOGRAPHY HEAD AND NECK  TECHNIQUE: Multidetector CT imaging of the head and neck was performed using the standard protocol during bolus administration of intravenous contrast. Multiplanar CT image reconstructions and MIPs were obtained to  evaluate the vascular anatomy. Carotid stenosis measurements (when applicable) are obtained utilizing NASCET criteria, using the distal internal carotid diameter as the denominator.  CONTRAST:  80mL OMNIPAQUE IOHEXOL 350 MG/ML SOLN  COMPARISON:  MRI brain 12/15/2013.  FINDINGS: CTA HEAD FINDINGS  No evidence for acute infarction, hemorrhage, mass lesion, hydrocephalus, or extra-axial fluid. Mild cerebral and cerebellar atrophy. No developing cortical hypodensity to suggest interval cerebral infarct.  Post infusion, no abnormal enhancement. Major dural venous sinuses patent.  The skull base, cavernous, and supraclinoid internal carotid arteries are widely patent. The RIGHT internal carotid artery is larger than the LEFT, due to a dominant A1 ACA on the RIGHT.  Vertebral arteries contribute roughly equally to the formation of basilar. No flow-limiting posterior circulation stenosis. No intracranial aneurysm.  Review of the MIP images confirms the above findings.  CTA NECK FINDINGS  Transverse arch atheromatous change. Conventional branching of the great vessels without proximal stenosis. No ostial lesions. Non stenotic atheromatous change both carotid bifurcations. No dissection or fibromuscular disease. Codominant vertebral arteries. Cervical spondylosis. Moderately prominent C2-C3 ossification posterior longitudinal ligament. No neck masses. Airway midline. No sinus or mastoid disease.  Review of the MIP images confirms the above findings.  IMPRESSION: No areas of significant extracranial or intracranial irregularity or flow-limiting stenosis.  No abnormal postcontrast enhancement.   Electronically Signed   By: Rolla Flatten M.D.   On: 12/16/2013 16:11   Dg Lumbar Spine Complete  01/06/2014   CLINICAL DATA:  Back pain, seizure activity, history of old back fracture.  EXAM: LUMBAR SPINE - COMPLETE 4+ VIEW  COMPARISON:  None.  FINDINGS: The lumbar vertebral bodies are preserved in height. There is disc space narrowing at L4-5 and at L5-S1. S1 is transitional. The pedicles and transverse processes are intact where visualized. There are prominent anterior endplate osteophytes at T12-L1, L1-L2, L2-L3, and L3-L4. There is facet joint hypertrophy in the mid and lower lumbar spine. There is no spondylolisthesis. There is calcification in the wall of the abdominal aorta.  IMPRESSION: There is multilevel degenerative disc and facet joint change of the lumbar spine. There is no compression fracture nor other acute bony  abnormality.   Electronically Signed   By: David  Martinique   On: 01/06/2014 10:20   Ct Head Wo Contrast  01/02/2014   CLINICAL DATA:  [place] striking head on towel rack this morning, dizziness before fall, head injury, history coronary artery disease post MI, hypertension, diabetes  EXAM: CT HEAD WITHOUT CONTRAST  TECHNIQUE: Contiguous axial images were obtained from the base of the skull through the vertex without intravenous contrast.  COMPARISON:  12/16/2013  FINDINGS: Mild atrophy.  Normal ventricular morphology.  No midline shift or mass effect.  Otherwise normal appearance of brain parenchyma.  No intracranial hemorrhage, mass lesion or evidence acute infarction.  No extra-axial fluid collections.  Atherosclerotic calcifications of the vertebral, basilar and internal carotid arteries.  Tiny amount of fluid dependently in RIGHT mastoid air cells, unchanged.  Visualized sinuses clear and bones unremarkable.  IMPRESSION: Mild generalized atrophy.  No acute intracranial abnormalities.  No significant interval change.   Electronically Signed   By: Lavonia Dana M.D.   On: 01/02/2014 11:18   Ct Angio Neck W/cm &/or Wo/cm  12/16/2013   CLINICAL DATA:  79 year old male with hearing loss presenting with sudden onset of dizziness and vertigo beginning 12/15/2013. MRI negative for acute stroke.  EXAM: CT ANGIOGRAPHY HEAD AND NECK  TECHNIQUE: Multidetector CT imaging of the head and neck was  performed using the standard protocol during bolus administration of intravenous contrast. Multiplanar CT image reconstructions and MIPs were obtained to evaluate the vascular anatomy. Carotid stenosis measurements (when applicable) are obtained utilizing NASCET criteria, using the distal internal carotid diameter as the denominator.  CONTRAST:  50mL OMNIPAQUE IOHEXOL 350 MG/ML SOLN  COMPARISON:  MRI brain 12/15/2013.  FINDINGS: CTA HEAD FINDINGS  No evidence for acute infarction, hemorrhage, mass lesion, hydrocephalus, or  extra-axial fluid. Mild cerebral and cerebellar atrophy. No developing cortical hypodensity to suggest interval cerebral infarct. Post infusion, no abnormal enhancement. Major dural venous sinuses patent.  The skull base, cavernous, and supraclinoid internal carotid arteries are widely patent. The RIGHT internal carotid artery is larger than the LEFT, due to a dominant A1 ACA on the RIGHT.  Vertebral arteries contribute roughly equally to the formation of basilar. No flow-limiting posterior circulation stenosis. No intracranial aneurysm.  Review of the MIP images confirms the above findings.  CTA NECK FINDINGS  Transverse arch atheromatous change. Conventional branching of the great vessels without proximal stenosis. No ostial lesions. Non stenotic atheromatous change both carotid bifurcations. No dissection or fibromuscular disease. Codominant vertebral arteries. Cervical spondylosis. Moderately prominent C2-C3 ossification posterior longitudinal ligament. No neck masses. Airway midline. No sinus or mastoid disease.  Review of the MIP images confirms the above findings.  IMPRESSION: No areas of significant extracranial or intracranial irregularity or flow-limiting stenosis.  No abnormal postcontrast enhancement.   Electronically Signed   By: Rolla Flatten M.D.   On: 12/16/2013 16:11   Ct Angio Chest Pe W/cm &/or Wo Cm  01/02/2014   CLINICAL DATA:  Syncopal episode.  Shortness of breath.  Hypoxia.  EXAM: CT ANGIOGRAPHY CHEST WITH CONTRAST  TECHNIQUE: Multidetector CT imaging of the chest was performed using the standard protocol during bolus administration of intravenous contrast. Multiplanar CT image reconstructions and MIPs were obtained to evaluate the vascular anatomy.  CONTRAST:  156mL OMNIPAQUE IOHEXOL 350 MG/ML SOLN  COMPARISON:  None.  FINDINGS: Vascular/Cardiac: Satisfactory opacification of pulmonary arteries is demonstated, and no pulmonary emboli are identified. No evidence of thoracic aortic aneurysm  or other significant abnormality. Mild cardiomegaly noted.  Mediastinum/Hilar Regions: No masses or pathologically enlarged lymph nodes identified.  Lungs:  No pulmonary infiltrate or mass identified.  Pleura:  No evidence of effusion or mass.  Musculoskeletal:  No suspicious bone lesions identified.  Other:  None.  Review of the MIP images confirms the above findings.  IMPRESSION: No evidence of pulmonary embolism or other acute findings.  Mild cardiomegaly.   Electronically Signed   By: Earle Gell M.D.   On: 01/02/2014 13:24   Mr Brain Wo Contrast  12/15/2013   CLINICAL DATA:  Initial evaluation for sudden onset dizziness, lightheadedness. Inability to walk.  EXAM: MRI HEAD WITHOUT CONTRAST  TECHNIQUE: Multiplanar, multiecho pulse sequences of the brain and surrounding structures were obtained without intravenous contrast.  COMPARISON:  None available.  FINDINGS: Study is somewhat degraded by motion artifact.  Diffuse prominence of the CSF containing spaces is compatible with generalized cerebral atrophy, moderate for patient age. No significant white matter changes present. No mass lesion, mass effect, or midline shift. Ventricles are normal in size without evidence of hydrocephalus. No extra-axial fluid collection.  No abnormal foci of restricted diffusion to suggest acute intracranial infarct. Gray-white matter differentiation maintained. Normal flow voids seen within the intracranial vasculature.  Craniocervical junction is normal. Pituitary gland within normal limits.  No acute abnormality seen about the orbits.  Paranasal sinuses  are clear. Scattered fluid signal intensity present within the right mastoid air cells. Left mastoid air cells are clear.  Bone marrow signal intensity is normal. Scalp soft tissues within normal limits.  IMPRESSION: 1. No acute intracranial infarct or other abnormality identified. 2. Moderate generalized cerebral atrophy.   Electronically Signed   By: Jeannine Boga M.D.    On: 12/15/2013 22:07   Dg Chest Port 1 View  01/02/2014   CLINICAL DATA:  Cough.  EXAM: PORTABLE CHEST - 1 VIEW  COMPARISON:  December 15, 2013.  FINDINGS: The heart size and mediastinal contours are within normal limits. Both lungs are clear. No pneumothorax or pleural effusion is noted. The visualized skeletal structures are unremarkable.  IMPRESSION: No acute cardiopulmonary abnormality seen.   Electronically Signed   By: Sabino Dick M.D.   On: 01/02/2014 11:42   Dg Chest Portable 1 View  12/15/2013   CLINICAL DATA:  "Lightheaded" , acute onset. Current history of hypertension, diabetes and coronary artery disease with prior MI.  EXAM: PORTABLE CHEST - 1 VIEW  COMPARISON:  Two-view chest x-ray 04/14/2013, 04/17/2011. Portable chest x-rays 04/08/2013, 02/18/2012.  FINDINGS: Markedly suboptimal inspiration which accounts for atelectasis in the lung bases and crowded bronchovascular markings diffusely. This also accentuates the heart size which is likely mildly enlarged but stable. Thoracic aorta mildly tortuous and atherosclerotic, unchanged. Lungs otherwise clear. No localized airspace consolidation. No pleural effusions. No pneumothorax. Normal pulmonary vascularity.  IMPRESSION: Suboptimal inspiration accounts for bibasilar atelectasis. No acute cardiopulmonary disease otherwise. Stable mild cardiomegaly without pulmonary edema.   Electronically Signed   By: Evangeline Dakin M.D.   On: 12/15/2013 09:09    HPI  79 y.o. male with history of DM 2/IDDM, essential hypertension, CAD, HLD, BPH/UTI/kidney stones/prior urological procedures, syncope, anxiety, vertigo was recently hospitalized 12/15/13-12/17/13 for sudden onset of dizziness, lightheadedness upon standing. He was extensively evaluated including negative orthostatic blood pressures, CTA head and symptoms were felt to be secondary to possible Mnire's disease. During that admission, he had one episode of similar involuntary generalized shaking  during OT evaluation. As per neurology recommendations, he was discharged on low-dose Valium and followed up with ENT 3 days back and was told to have vertigo. History is obtained from patient and family at bedside. He apparently did not have any further episodes of dizziness until this morning. He had been discharged to SNF for rehabilitation. While in rehabilitation, this morning he got up and while in the toilet turned around, experienced some dizziness and fell hitting his forehead to the toilet rack and landed on the ground. He denies any chest pain, palpitations or dyspnea. He denies loss of consciousness. He was able to get up by himself. His daughter was informed by the SNF and went by to see him. In her presence, he got up from the bed and returned from the toilet and appeared pale. While sitting on the reclining chair, he started having shaking of all his limbs but was awake and able to answer questions. EMS arrived and patient continued shaking and had an episode of LOC of undetermined duration. No reported tongue biting, frothing, urinary or fecal incontinence. As per family, the shaking lasted until he came to the ED and was given a dose of Ativan and it's up sided. As per discussion with EDP, patient had a similar shaking episode which was not consistent with chills or seizures and he was awake during the episode. In the ED, patient had acute cardia which improved after 2  L of IV fluids. UA suspicious for urinary tract infection and lactic acid elevated. CT head without acute findings, CTA chest negative for PE or acute findings. Hospitalist admission requested. Patient has a good appetite and no reported nausea, vomiting or diarrhea. As per family, patient had a fever of 101 prior to ED arrival.   Hospital Course    Myoclonic Jerks/ Convulsive episodes Had one episode with urinary incontinence on day 1, likely is myoclonic convulsion initiated with sitting up and movements, recent MRI and EEG  unremarkable, neuro following discussed with Dr Aram Beecham, placed on Keppra, somewhat improved clinically. From 5-6 episodes a day down to one episode yesterday on Keppra, today able to sit up to 5 minutes and then had another episode of myoclonic jerks. Family extremely frustrated, discussed with family members and neurologist in room. Consensus to try and transfer to a tertiary level Center for further input and possibly prolonged EEG. Discussed with [facility] neurologist Dr. Hortense Ramal who graciously accepted the patient for further workup.  I have clearly told the patient and family members which include his daughter and sister in the presence of neurologist bedside that [facility] physicians will do there workup to the best of their ability, but there is no guarantee of a definitive diagnosis and resolution/cure of his symptoms.     UTI with Sepsis Cultures noted continue Rocephin to complete 5 days stop date 01-07-14. Sepsis resolved after supportive care.     DM II cbgs well controlled in house on lantus and sliding scale. A1c 7.5 on 10/1  CBG (last 3)   Recent Labs  01/05/14 1709 01/05/14 2204 01/06/14 0754  GLUCAP 145* 137* 137*      Hypertension Moderately controlled. Medications adjusted by Cardiology. Currently on coreg, hydralazine, hctz, amlodipine, and lisinopril. Per cards if orthostatic - would drop hydralazine.    Chronic systolic heart failure.  Compensated, EF 35-40% by echo.  Likely chronic per Cards consultatation. Outpatient lexiscan cardiolite recommended. Will need outpatient cardiology follow-up for outpatient stress test. On aspirin and ACE inhibitor and now placed on Coreg as well.    Lactic Acidosis Resolved with IVF. Lactic acid 1.5 on 11/25. Due to Fall and convulsions    Peripheral neuropathy.  placed on Neurontin    Family told me that he had a fall several months ago and possibly hurt his lower back.  Family requested  L-spine x-ray which have been ordered, no point tenderness on exam, patient denies any lower back injury or back pain. Pending L-spine x-ray results please follow.    Chronic right lower extremity swelling.  Minimal, venous duplex negative.    Hacking cough, clear sputum with left sided lymph node. Almost completely resolved with supportive care, chest x-ray unremarkable.      Today   Subjective:   Burnis Halling today has no headache,no chest abdominal pain,no new weakness tingling or numbness.  Objective:   Blood pressure 137/73, pulse 79, temperature 97.9 F (36.6 C), temperature source Oral, resp. rate 20, height 6' (1.829 m), weight 105.235 kg (232 lb), SpO2 92 %.  Intake/Output Summary (Last 24 hours) at 01/06/14 1035 Last data filed at 01/06/14 0900  Gross per 24 hour  Intake    400 ml  Output   2250 ml  Net  -1850 ml    Exam Awake Alert, Oriented *3, No new F.N deficits, Normal affect [place].AT,PERRAL Supple Neck,No JVD, No cervical lymphadenopathy appriciated.  Symmetrical Chest wall movement, Good air movement bilaterally, CTAB RRR,No Gallops,Rubs or  new Murmurs, No Parasternal Heave +ve B.Sounds, Abd Soft, Non tender, No organomegaly appriciated, No rebound -guarding or rigidity. No Cyanosis, Clubbing or edema, No new Rash or bruise  Data Review  CBC w Diff: Lab Results  Component Value Date   WBC 6.8 01/04/2014   HGB 14.5 01/04/2014   HCT 43.8 01/04/2014   PLT 286 01/04/2014   LYMPHOPCT 20 01/02/2014   MONOPCT 11 01/02/2014   EOSPCT 3 01/02/2014   BASOPCT 1 01/02/2014   CMP: Lab Results  Component Value Date   NA 141 01/04/2014   K 3.9 01/04/2014   CL 101 01/04/2014   CO2 29 01/04/2014   BUN 13 01/04/2014   CREATININE 1.14 01/04/2014   PROT 6.9 01/02/2014   ALBUMIN 3.9 01/02/2014   BILITOT 0.5 01/02/2014   ALKPHOS 66 01/02/2014   AST 22 01/02/2014   ALT 34 01/02/2014  .  Prior to Admission medications   Medication Sig Start Date End  Date Taking? Authorizing Provider  acetaminophen (TYLENOL) 650 MG CR tablet Take 650 mg by mouth every 4 (four) hours as needed for pain or fever.   Yes Historical Provider, MD  amLODipine (NORVASC) 10 MG tablet Take 1 tablet (10 mg total) by mouth every morning. 11/10/13  Yes Tonia Ghent, MD  aspirin EC 81 MG tablet Take 81 mg by mouth daily.   Yes Historical Provider, MD  diazepam (VALIUM) 5 MG tablet Take 0.5 tablets (2.5 mg total) by mouth 2 (two) times daily. 12/17/13  Yes Maryann Mikhail, DO  glimepiride (AMARYL) 4 MG tablet Take 1 tablet (4 mg total) by mouth daily with breakfast. 11/10/13  Yes Tonia Ghent, MD  hydrALAZINE (APRESOLINE) 50 MG tablet Take 50 mg by mouth 3 (three) times daily.   Yes Historical Provider, MD  insulin aspart (NOVOLOG) 100 UNIT/ML injection Inject 4-15 Units into the skin 3 (three) times daily before meals.   Yes Historical Provider, MD  Insulin Glargine ([name] SOLOSTAR) 300 UNIT/ML SOPN Inject 20 Units into the skin at bedtime.   Yes Historical Provider, MD  lisinopril-hydrochlorothiazide (PRINZIDE,ZESTORETIC) 20-25 MG per tablet Take 1 tablet by mouth daily.   Yes Historical Provider, MD  metFORMIN (GLUCOPHAGE) 1000 MG tablet Take 1 tablet (1,000 mg total) by mouth 2 (two) times daily with a meal. 12/19/13  Yes Maryann Mikhail, DO  metoprolol (LOPRESSOR) 50 MG tablet Take 1 tablet (50 mg total) by mouth 2 (two) times daily. 11/10/13  Yes Tonia Ghent, MD  pravastatin (PRAVACHOL) 40 MG tablet Take 1 tablet (40 mg total) by mouth at bedtime. 11/10/13  Yes Tonia Ghent, MD    . amLODipine  10 mg Oral q morning - 10a  . antiseptic oral rinse  7 mL Mouth Rinse BID  . aspirin EC  325 mg Oral Daily  . carvedilol  6.25 mg Oral BID WC  . cefTRIAXone (ROCEPHIN)  IV  1 g Intravenous Q24H  . diazepam  2.5 mg Oral BID  . enoxaparin (LOVENOX) injection  40 mg Subcutaneous Q24H  . fluticasone  1 spray Each Nare Daily  . gabapentin  300 mg Oral TID  . hydrALAZINE   50 mg Oral 3 times per day  . hydrochlorothiazide  25 mg Oral Daily  . insulin aspart  0-15 Units Subcutaneous TID WC  . insulin aspart  0-5 Units Subcutaneous QHS  . insulin glargine  20 Units Subcutaneous QHS  . levETIRAcetam  500 mg Intravenous Q12H  . lisinopril  20 mg Oral  Daily  . loratadine  10 mg Oral Daily  . pravastatin  40 mg Oral QHS  . sodium chloride  3 mL Intravenous Q12H  . tamsulosin  0.4 mg Oral Daily     The risks and  benefits including possible death-disability of treating patient at this facility vs transporting the patinet to a higher level of care were discussed in detail with sister-daughter and the plan was acceptable to them.   Total Time in preparing paper work, todays exam and data evaluation 35 minutes  Thurnell Lose M.D on 01/06/2014 at 10:35 AM  [facility] Office  [phone]

## 2014-01-06 NOTE — Progress Notes (Signed)
PT Cancellation Note  Patient Details Name: [name] MRN: [id number] DOB: December 25, 1934   Cancelled Treatment:    Reason Eval/Treat Not Completed: Other (comment). Spoke with RN, pt to be transferred to [facility] today for further workup. Will sign off on pt at this time due to further workup pending. Please re-order if pt does not D/C to another facility.    Elie Confer [place], [place] 01/06/2014, 10:55 AM

## 2014-01-06 NOTE — Progress Notes (Signed)
   01/06/14 1227  Clinical Encounter Type  Visited With Family;Patient and family together;Health care provider  Visit Type Spiritual support  Referral From Family  Consult/Referral To Chaplain  Stress Factors  Patient Stress Factors Health changes  Family Stress Factors Health changes  Chaplain responded to page that family needs support. Pt's sister and daughter at beside. Later granddaughter arrived too. All working to explain to pt why they wanted him to sign form to have him sent to [facility]. All very worried about pt. Chaplain got nurse, and pt signed form with nurse in room. Chaplain provided emotional support to family. Pt was sleepy and chaplain did not interact with pt directly. Chaplain left an advanced directive form. Family assured chaplain that pt is still able to make medical decisions, but that he has been slightly groggy and confused today. Will follow as needed.   Vanetta Mulders 01/06/2014 12:29 PM

## 2014-01-06 NOTE — Progress Notes (Signed)
Report called into [place], RN for transfer.

## 2014-01-10 DIAGNOSIS — R270 Ataxia, unspecified: Secondary | ICD-10-CM | POA: Insufficient documentation

## 2014-01-10 LAB — HM DIABETES EYE EXAM

## 2014-01-13 ENCOUNTER — Encounter: Payer: Self-pay | Admitting: Family Medicine

## 2014-01-13 ENCOUNTER — Ambulatory Visit (INDEPENDENT_AMBULATORY_CARE_PROVIDER_SITE_OTHER): Payer: Medicare Other | Admitting: Family Medicine

## 2014-01-13 ENCOUNTER — Ambulatory Visit: Payer: Medicare Other | Admitting: Family Medicine

## 2014-01-13 VITALS — BP 156/80 | HR 67 | Temp 97.6°F | Wt 231.0 lb

## 2014-01-13 DIAGNOSIS — R55 Syncope and collapse: Secondary | ICD-10-CM

## 2014-01-13 DIAGNOSIS — G253 Myoclonus: Secondary | ICD-10-CM

## 2014-01-13 DIAGNOSIS — R258 Other abnormal involuntary movements: Secondary | ICD-10-CM

## 2014-01-13 DIAGNOSIS — R259 Unspecified abnormal involuntary movements: Secondary | ICD-10-CM

## 2014-01-13 MED ORDER — INSULIN ASPART 100 UNIT/ML ~~LOC~~ SOLN: 4.0000 [IU] | Freq: Three times a day (TID) | SUBCUTANEOUS | Status: DC

## 2014-01-13 MED ORDER — GLIMEPIRIDE 4 MG PO TABS: 4.0000 mg | ORAL_TABLET | Freq: Every day | ORAL | Status: DC

## 2014-01-13 NOTE — Progress Notes (Signed)
Pre visit review using our clinic review tool, if applicable. No additional management support is needed unless otherwise documented below in the visit note.  11.5.15 he was dizzy, got worse as the day went on and called 911.  Sent to [facility], admitted.  Developed tremors at the hospital, while admitted. Neg w/u.  Then to [facility] on 11.7.15.  While there he has more tremors on 11.23.15.  He was noted to have syncope at that point.  Readmitted to [facility].   W/u still neg at [facility].  Transferred to [facility].    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 The patient worked with PT and OT during his hospitalization who recommended SNF. On 01/10/2014 the patient was deemed medically stable for discharge to SNF with close neurology and PCP follow up.   Balance and BLE strength at not back to baseline with PT/OT continuing at [facility].    D/w pt about his w/u and sx at this point.  D/w pt about ddx, he was concerned about ALS.   He has shown improvement on BZD and with PT and has no persistent fasciculations.   Meds, vitals, and allergies reviewed.   ROS: See HPI.  Otherwise, noncontributory.  GEN: nad, alert and oriented HEENT: mucous membranes moist NECK: supple w/o LA CV: rrr.  PULM: ctab, no inc wob ABD: soft, +bs EXT: no edema SKIN: no acute rash CN 2-12 wnl B, S/S/DTR wnl x4 except for diffuse mild B weakness in the legs, with hip flexion.

## 2014-01-13 NOTE — Patient Instructions (Signed)
[name] will call about your referrals.  Don't change your meds for now.   Keep going with PT and OT.   Take care.  I want to recheck your sugar tests before a visit in the spring of 2016 (April 2016).

## 2014-01-15 NOTE — Assessment & Plan Note (Signed)
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  This doesn't seem typical, given that he has improved with BZD and PT.   He still needs more PT, continue at [facility] for BLE weakness that appears to be from deconditioning.  He agrees.   Refer to neuro.   Continue BZD as is for now, no ADE on meds.  >25 minutes spent in face to face time with patient, >50% spent in counselling or coordination of care.

## 2014-01-15 NOTE — Assessment & Plan Note (Signed)
No further sx at this point, no CP, not SOB.  Give the overall situation would be reasonable to have him see cards again and we'll set this up.  Okay for outpatient f/u.  He isn't driving.

## 2014-01-16 ENCOUNTER — Encounter: Payer: Self-pay | Admitting: Family Medicine

## 2014-01-18 ENCOUNTER — Telehealth: Payer: Self-pay | Admitting: Family Medicine

## 2014-01-18 NOTE — Telephone Encounter (Signed)
Patient notified as instructed by telephone. Patient verbalized understanding. 

## 2014-01-18 NOTE — Telephone Encounter (Signed)
Please call pt.  GI sent me a note about scheduling a follow up with them.  I would wait until his cardiac and neuro consults/concerns are addressed/resolved.  Please have him contact GI at that point.  Thanks.

## 2014-01-26 ENCOUNTER — Inpatient Hospital Stay (HOSPITAL_COMMUNITY)
Admission: EM | Admit: 2014-01-26 | Discharge: 2014-01-28 | DRG: 690 | Disposition: A | Discharge status: Skilled Nursing Facility | Payer: Medicare Other | Attending: Internal Medicine | Admitting: Internal Medicine

## 2014-01-26 ENCOUNTER — Telehealth: Payer: Self-pay | Admitting: Family Medicine

## 2014-01-26 ENCOUNTER — Encounter (HOSPITAL_COMMUNITY): Payer: Self-pay | Admitting: *Deleted

## 2014-01-26 ENCOUNTER — Emergency Department (HOSPITAL_COMMUNITY): Payer: Medicare Other

## 2014-01-26 DIAGNOSIS — E785 Hyperlipidemia, unspecified: Secondary | ICD-10-CM | POA: Diagnosis present

## 2014-01-26 DIAGNOSIS — I129 Hypertensive chronic kidney disease with stage 1 through stage 4 chronic kidney disease, or unspecified chronic kidney disease: Secondary | ICD-10-CM | POA: Diagnosis present

## 2014-01-26 DIAGNOSIS — E1122 Type 2 diabetes mellitus with diabetic chronic kidney disease: Secondary | ICD-10-CM | POA: Diagnosis present

## 2014-01-26 DIAGNOSIS — N390 Urinary tract infection, site not specified: Principal | ICD-10-CM | POA: Diagnosis present

## 2014-01-26 DIAGNOSIS — Z885 Allergy status to narcotic agent status: Secondary | ICD-10-CM

## 2014-01-26 DIAGNOSIS — M1990 Unspecified osteoarthritis, unspecified site: Secondary | ICD-10-CM | POA: Diagnosis present

## 2014-01-26 DIAGNOSIS — G253 Myoclonus: Secondary | ICD-10-CM | POA: Diagnosis present

## 2014-01-26 DIAGNOSIS — Z7982 Long term (current) use of aspirin: Secondary | ICD-10-CM

## 2014-01-26 DIAGNOSIS — R251 Tremor, unspecified: Secondary | ICD-10-CM

## 2014-01-26 DIAGNOSIS — G629 Polyneuropathy, unspecified: Secondary | ICD-10-CM | POA: Diagnosis present

## 2014-01-26 DIAGNOSIS — R531 Weakness: Secondary | ICD-10-CM

## 2014-01-26 DIAGNOSIS — I2510 Atherosclerotic heart disease of native coronary artery without angina pectoris: Secondary | ICD-10-CM | POA: Diagnosis present

## 2014-01-26 DIAGNOSIS — F329 Major depressive disorder, single episode, unspecified: Secondary | ICD-10-CM | POA: Diagnosis present

## 2014-01-26 DIAGNOSIS — Z9841 Cataract extraction status, right eye: Secondary | ICD-10-CM

## 2014-01-26 DIAGNOSIS — N400 Enlarged prostate without lower urinary tract symptoms: Secondary | ICD-10-CM | POA: Diagnosis present

## 2014-01-26 DIAGNOSIS — Z794 Long term (current) use of insulin: Secondary | ICD-10-CM

## 2014-01-26 DIAGNOSIS — R2681 Unsteadiness on feet: Secondary | ICD-10-CM

## 2014-01-26 DIAGNOSIS — I252 Old myocardial infarction: Secondary | ICD-10-CM

## 2014-01-26 DIAGNOSIS — R259 Unspecified abnormal involuntary movements: Secondary | ICD-10-CM

## 2014-01-26 DIAGNOSIS — E1129 Type 2 diabetes mellitus with other diabetic kidney complication: Secondary | ICD-10-CM | POA: Diagnosis present

## 2014-01-26 DIAGNOSIS — Z86010 Personal history of colonic polyps: Secondary | ICD-10-CM

## 2014-01-26 DIAGNOSIS — N189 Chronic kidney disease, unspecified: Secondary | ICD-10-CM | POA: Diagnosis present

## 2014-01-26 DIAGNOSIS — I5042 Chronic combined systolic (congestive) and diastolic (congestive) heart failure: Secondary | ICD-10-CM | POA: Diagnosis present

## 2014-01-26 LAB — GLUCOSE, CAPILLARY: GLUCOSE-CAPILLARY: 75 mg/dL (ref 70–99)

## 2014-01-26 LAB — CBC WITH DIFFERENTIAL/PLATELET
Basophils Absolute: 0 K/uL (ref 0.0–0.1)
Basophils Relative: 1 % (ref 0–1)
Eosinophils Absolute: 0.2 K/uL (ref 0.0–0.7)
Eosinophils Relative: 4 % (ref 0–5)
HCT: 44.2 % (ref 39.0–52.0)
Hemoglobin: 14.8 g/dL (ref 13.0–17.0)
Lymphocytes Relative: 31 % (ref 12–46)
Lymphs Abs: 1.8 K/uL (ref 0.7–4.0)
MCH: 32.1 pg (ref 26.0–34.0)
MCHC: 33.5 g/dL (ref 30.0–36.0)
MCV: 95.9 fL (ref 78.0–100.0)
Monocytes Absolute: 0.7 K/uL (ref 0.1–1.0)
Monocytes Relative: 12 % (ref 3–12)
Neutro Abs: 3 K/uL (ref 1.7–7.7)
Neutrophils Relative %: 52 % (ref 43–77)
Platelets: 317 K/uL (ref 150–400)
RBC: 4.61 MIL/uL (ref 4.22–5.81)
RDW: 13.5 % (ref 11.5–15.5)
WBC: 5.8 K/uL (ref 4.0–10.5)

## 2014-01-26 LAB — COMPREHENSIVE METABOLIC PANEL WITH GFR
ALT: 30 U/L (ref 0–53)
AST: 19 U/L (ref 0–37)
Albumin: 3.7 g/dL (ref 3.5–5.2)
Alkaline Phosphatase: 62 U/L (ref 39–117)
Anion gap: 14 (ref 5–15)
BUN: 19 mg/dL (ref 6–23)
CO2: 24 meq/L (ref 19–32)
Calcium: 9.7 mg/dL (ref 8.4–10.5)
Chloride: 99 meq/L (ref 96–112)
Creatinine, Ser: 1.06 mg/dL (ref 0.50–1.35)
GFR calc Af Amer: 76 mL/min — ABNORMAL LOW
GFR calc non Af Amer: 66 mL/min — ABNORMAL LOW
Glucose, Bld: 141 mg/dL — ABNORMAL HIGH (ref 70–99)
Potassium: 3.5 meq/L — ABNORMAL LOW (ref 3.7–5.3)
Sodium: 137 meq/L (ref 137–147)
Total Bilirubin: 0.4 mg/dL (ref 0.3–1.2)
Total Protein: 6.7 g/dL (ref 6.0–8.3)

## 2014-01-26 LAB — URINALYSIS, ROUTINE W REFLEX MICROSCOPIC
Bilirubin Urine: NEGATIVE
Glucose, UA: NEGATIVE mg/dL
Hgb urine dipstick: NEGATIVE
Ketones, ur: NEGATIVE mg/dL
Nitrite: POSITIVE — AB
Protein, ur: NEGATIVE mg/dL
Specific Gravity, Urine: 1.018 (ref 1.005–1.030)
Urobilinogen, UA: 0.2 mg/dL (ref 0.0–1.0)
pH: 6 (ref 5.0–8.0)

## 2014-01-26 LAB — TROPONIN I
Troponin I: <0.3 ng/mL
Troponin I: <0.3 ng/mL (ref <0.30)
Troponin I: <0.3 ng/mL (ref <0.30)

## 2014-01-26 LAB — URINE MICROSCOPIC-ADD ON

## 2014-01-26 MED ORDER — ALUM & MAG HYDROXIDE-SIMETH 200-200-20 MG/5ML PO SUSP: 30.0000 mL | Freq: Four times a day (QID) | ORAL | Status: DC | PRN

## 2014-01-26 MED ORDER — ACETAMINOPHEN 650 MG RE SUPP: 650.0000 mg | Freq: Four times a day (QID) | RECTAL | Status: DC | PRN

## 2014-01-26 MED ORDER — INSULIN ASPART 100 UNIT/ML ~~LOC~~ SOLN
0.0000 [IU] | Freq: Three times a day (TID) | SUBCUTANEOUS | Status: DC
  Administered 2014-01-27 – 2014-01-28 (×2): 1 [IU] via SUBCUTANEOUS

## 2014-01-26 MED ORDER — DEXTROSE 5 % IV SOLN
1.0000 g | INTRAVENOUS | Status: DC
  Administered 2014-01-27: 1 g via INTRAVENOUS
  Filled 2014-01-26 (×2): qty 10

## 2014-01-26 MED ORDER — TAMSULOSIN HCL 0.4 MG PO CAPS
0.4000 mg | ORAL_CAPSULE | Freq: Every day | ORAL | Status: DC
  Administered 2014-01-26 – 2014-01-27 (×2): 0.4 mg via ORAL
  Filled 2014-01-26 (×3): qty 1

## 2014-01-26 MED ORDER — LORAZEPAM 2 MG/ML IJ SOLN
0.5000 mg | Freq: Once | INTRAMUSCULAR | Status: AC
  Administered 2014-01-26: 0.5 mg via INTRAVENOUS
  Filled 2014-01-26: qty 1

## 2014-01-26 MED ORDER — ONDANSETRON HCL 4 MG/2ML IJ SOLN: 4.0000 mg | Freq: Four times a day (QID) | INTRAMUSCULAR | Status: DC | PRN

## 2014-01-26 MED ORDER — ONDANSETRON HCL 4 MG PO TABS: 4.0000 mg | ORAL_TABLET | Freq: Four times a day (QID) | ORAL | Status: DC | PRN

## 2014-01-26 MED ORDER — CLONAZEPAM 1 MG PO TABS
1.0000 mg | ORAL_TABLET | Freq: Two times a day (BID) | ORAL | Status: DC
  Administered 2014-01-26 – 2014-01-27 (×2): 1 mg via ORAL
  Filled 2014-01-26 (×2): qty 1

## 2014-01-26 MED ORDER — INSULIN GLARGINE 300 UNIT/ML ~~LOC~~ SOPN: 20.0000 [IU] | PEN_INJECTOR | Freq: Every day | SUBCUTANEOUS | Status: DC

## 2014-01-26 MED ORDER — GLIMEPIRIDE 4 MG PO TABS
4.0000 mg | ORAL_TABLET | Freq: Every day | ORAL | Status: DC
  Administered 2014-01-27 – 2014-01-28 (×2): 4 mg via ORAL
  Filled 2014-01-26 (×4): qty 1

## 2014-01-26 MED ORDER — DEXTROSE 5 % IV SOLN
1.0000 g | Freq: Once | INTRAVENOUS | Status: AC
  Administered 2014-01-26: 1 g via INTRAVENOUS
  Filled 2014-01-26: qty 10

## 2014-01-26 MED ORDER — SODIUM CHLORIDE 0.9 % IJ SOLN
3.0000 mL | Freq: Two times a day (BID) | INTRAMUSCULAR | Status: DC
  Administered 2014-01-26 – 2014-01-28 (×2): 3 mL via INTRAVENOUS

## 2014-01-26 MED ORDER — ASPIRIN EC 81 MG PO TBEC
81.0000 mg | DELAYED_RELEASE_TABLET | Freq: Every morning | ORAL | Status: DC
  Administered 2014-01-27 – 2014-01-28 (×2): 81 mg via ORAL
  Filled 2014-01-26 (×2): qty 1

## 2014-01-26 MED ORDER — METOPROLOL TARTRATE 50 MG PO TABS
50.0000 mg | ORAL_TABLET | Freq: Two times a day (BID) | ORAL | Status: DC
  Administered 2014-01-26 – 2014-01-28 (×4): 50 mg via ORAL
  Filled 2014-01-26 (×5): qty 1

## 2014-01-26 MED ORDER — LEVETIRACETAM 500 MG PO TABS
500.0000 mg | ORAL_TABLET | Freq: Two times a day (BID) | ORAL | Status: DC
  Administered 2014-01-27 – 2014-01-28 (×3): 500 mg via ORAL
  Filled 2014-01-26 (×4): qty 1

## 2014-01-26 MED ORDER — DEXTROSE 5 % IV SOLN
1.0000 g | INTRAVENOUS | Status: DC
Start: 2014-01-26 — End: 2014-01-26

## 2014-01-26 MED ORDER — AMLODIPINE BESYLATE 10 MG PO TABS
10.0000 mg | ORAL_TABLET | Freq: Every morning | ORAL | Status: DC
  Administered 2014-01-27 – 2014-01-28 (×2): 10 mg via ORAL
  Filled 2014-01-26 (×2): qty 1

## 2014-01-26 MED ORDER — LEVETIRACETAM IN NACL 500 MG/100ML IV SOLN
500.0000 mg | Freq: Once | INTRAVENOUS | Status: AC
  Administered 2014-01-26: 500 mg via INTRAVENOUS
  Filled 2014-01-26: qty 100

## 2014-01-26 MED ORDER — SODIUM CHLORIDE 0.9 % IV SOLN
INTRAVENOUS | Status: DC
  Administered 2014-01-26: 17:00:00 via INTRAVENOUS

## 2014-01-26 MED ORDER — PRAVASTATIN SODIUM 40 MG PO TABS
40.0000 mg | ORAL_TABLET | Freq: Every day | ORAL | Status: DC
  Administered 2014-01-27: 40 mg via ORAL
  Filled 2014-01-26 (×2): qty 1

## 2014-01-26 MED ORDER — INSULIN GLARGINE 100 UNIT/ML ~~LOC~~ SOLN
20.0000 [IU] | Freq: Every day | SUBCUTANEOUS | Status: DC
  Administered 2014-01-26 – 2014-01-27 (×2): 20 [IU] via SUBCUTANEOUS
  Filled 2014-01-26 (×2): qty 0.2

## 2014-01-26 MED ORDER — ENOXAPARIN SODIUM 60 MG/0.6ML ~~LOC~~ SOLN
50.0000 mg | SUBCUTANEOUS | Status: DC
  Administered 2014-01-26 – 2014-01-27 (×2): 50 mg via SUBCUTANEOUS
  Filled 2014-01-26 (×3): qty 0.6

## 2014-01-26 MED ORDER — ACETAMINOPHEN 325 MG PO TABS: 650.0000 mg | ORAL_TABLET | Freq: Four times a day (QID) | ORAL | Status: DC | PRN

## 2014-01-26 MED ORDER — HYDRALAZINE HCL 50 MG PO TABS
50.0000 mg | ORAL_TABLET | Freq: Three times a day (TID) | ORAL | Status: DC
  Administered 2014-01-26 – 2014-01-28 (×6): 50 mg via ORAL
  Filled 2014-01-26 (×10): qty 1

## 2014-01-26 NOTE — ED Provider Notes (Signed)
CSN: [id number]     Arrival date & time 01/26/14  1050 History   First MD Initiated Contact with Patient 01/26/14 1102     Chief Complaint  Patient presents with  . Tremors     (Consider location/radiation/quality/duration/timing/severity/associated sxs/prior Treatment) HPI Comments: Patient resents from nursing home with episodes of generalized shaking of arms and legs since last night. Family describes episodes of jerking of bilateral hands and feet on and off for one hour last night and one on and off for one hour in this morning. Patient maintains awareness during these episodes. This is similar to last month when he was admitted to the hospital. He was transferred to [facility] and diagnosed with startle myoclonus and started on clonazepam. He's been taking this has not had further episodes until last night. Patient denies any recent falls. Denies any headache, chest pain, shortness of breath. No abdominal pain, nausea or vomiting. No focal weakness, numbness or tingling. No dizziness. Good by mouth intake and urine output.  The history is provided by the patient and a relative.    Past Medical History  Diagnosis Date  . CAD (coronary artery disease)   . History of syncope   . HTN (hypertension)   . Borderline hyperlipidemia   . History of gastritis   . Diverticulosis of colon   . Colon polyps   . Hypertrophy of prostate with urinary obstruction and other lower urinary tract symptoms (LUTS)   . History of pyelonephritis   . DJD (degenerative joint disease)   . Anxiety   . DM (diabetes mellitus)     Adult onset  . 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 History of kidney stones    Past Surgical History  Procedure Laterality Date  . Appendectomy    . Cataract extraction      RIGHT EYE  . Knee surgery      bilateral ARTHROSCOPY X2 TO EACH KNEE  . Shoulder surgery  04/2005    left by Dr. Berenice Primas  . S/p elap 1986  w/excision of leiomyoma @ ge junction, meckle's divertic resected, & cholecystectomy    . S/p cysto & stents for kidnedy stone 11/08 by dr. Reece Agar    . Cystoscopy  04/17/11    stent placed  . Cystoscopy w/ ureteral stent placement  04/17/2011    Procedure: CYSTOSCOPY WITH RETROGRADE PYELOGRAM/URETERAL STENT PLACEMENT;  Surgeon: Hanley Ben, MD;  Location: [facility];  Service: Urology;  Laterality: Left;  . Cystoscopy with retrograde pyelogram, ureteroscopy and stent placement Left 04/18/2013    Procedure: CYSTOSCOPY WITH LEFT RETROGRADE PYELOGRAM, LEFT URETEROSCOPY AND LASER LITHOTRIPSY LEFT STENT PLACEMENT;  Surgeon: Dutch Gray, MD;  Location: [facility];  Service: Urology;  Laterality: Left;  . Holmium laser application Left 8/0/9983    Procedure: HOLMIUM LASER APPLICATION;  Surgeon: Dutch Gray, MD;  Location: [facility];  Service: Urology;  Laterality: Left;   Family History  Problem Relation Age of Onset  . Heart attack Mother   . Dementia Brother   . Colon cancer Neg Hx   . Prostate cancer Neg Hx    History  Substance Use Topics  . Smoking status: Never Smoker   . Smokeless tobacco: Never Used  . Alcohol Use: No    Review of Systems  Constitutional: Negative for fever, activity change and appetite change.  Respiratory: Negative for chest tightness.   Cardiovascular: Negative for  chest pain and palpitations.  Gastrointestinal: Negative for nausea, vomiting and abdominal pain.  Genitourinary: Negative for dysuria and hematuria.  Musculoskeletal: Negative for myalgias, back pain and arthralgias.  Skin: Negative for rash.  Neurological: Positive for tremors. Negative for dizziness, seizures, light-headedness, numbness and headaches.  A complete 10 system review of systems was obtained and all systems are negative except as noted in the HPI and PMH.      Allergies  Codeine phosphate and Morphine  Home Medications   Prior to Admission medications   Medication Sig Start Date End Date  Taking? Authorizing Provider  acetaminophen (TYLENOL) 650 MG CR tablet Take 650 mg by mouth every 4 (four) hours as needed for pain or fever.   Yes Historical Provider, MD  amLODipine (NORVASC) 10 MG tablet Take 1 tablet (10 mg total) by mouth every morning. 11/10/13  Yes Tonia Ghent, MD  aspirin EC 81 MG tablet Take 81 mg by mouth every morning.    Yes Historical Provider, MD  clonazePAM (KLONOPIN) 1 MG tablet Take 1 mg by mouth 2 (two) times daily.   Yes Historical Provider, MD  glimepiride (AMARYL) 4 MG tablet Take 1 tablet (4 mg total) by mouth daily with breakfast. 01/13/14  Yes Tonia Ghent, MD  hydrALAZINE (APRESOLINE) 50 MG tablet Take 50 mg by mouth 3 (three) times daily.   Yes Historical Provider, MD  insulin aspart (NOVOLOG) 100 UNIT/ML injection Inject 4-10 Units into the skin 3 (three) times daily before meals. Patient taking differently: Inject 0-10 Units into the skin 3 (three) times daily before meals. SSI: <150 = 0 units, 151-200 = 4 units, 201-250 units, 251-300 = 8 units, >300 = 10 units 01/13/14  Yes Tonia Ghent, MD  Insulin Glargine ([name] SOLOSTAR) 300 UNIT/ML SOPN Inject 20 Units into the skin at bedtime.   Yes Historical Provider, MD  lisinopril-hydrochlorothiazide (PRINZIDE,ZESTORETIC) 20-25 MG per tablet Take 1 tablet by mouth every morning.    Yes Historical Provider, MD  metFORMIN (GLUCOPHAGE) 1000 MG tablet Take 1 tablet (1,000 mg total) by mouth 2 (two) times daily with a meal. 12/19/13  Yes Maryann Mikhail, DO  metoprolol (LOPRESSOR) 50 MG tablet Take 1 tablet (50 mg total) by mouth 2 (two) times daily. 11/10/13  Yes Tonia Ghent, MD  pravastatin (PRAVACHOL) 40 MG tablet Take 1 tablet (40 mg total) by mouth at bedtime. Patient taking differently: Take 40 mg by mouth every morning.  11/10/13  Yes Tonia Ghent, MD  tamsulosin (FLOMAX) 0.4 MG CAPS capsule Take 0.4 mg by mouth daily after supper.   Yes Historical Provider, MD   BP 136/66 mmHg  Pulse 67   Temp(Src) 97.8 F (36.6 C) (Oral)  Resp 22  Ht 5\' 11"  (1.803 m)  Wt 228 lb 6.3 oz (103.6 kg)  BMI 31.87 kg/m2  SpO2 91% Physical Exam  Constitutional: He is oriented to person, place, and time. He appears well-developed and well-nourished. No distress.  HENT:  Head: Normocephalic and atraumatic.  Mouth/Throat: Oropharynx is clear and moist. No oropharyngeal exudate.  Eyes: Conjunctivae and EOM are normal. Pupils are equal, round, and reactive to light.  Neck: Normal range of motion. Neck supple.  No meningismus.  Cardiovascular: Normal rate, regular rhythm, normal heart sounds and intact distal pulses.   No murmur heard. Pulmonary/Chest: Effort normal and breath sounds normal. No respiratory distress.  Abdominal: Soft. There is no tenderness. There is no rebound and no guarding.  Musculoskeletal: Normal range of motion. He  exhibits no edema or tenderness.  Neurological: He is alert and oriented to person, place, and time. No cranial nerve deficit. He exhibits normal muscle tone. Coordination normal.  No ataxia on finger to nose bilaterally. No pronator drift. 5/5 strength throughout. CN 2-12 intact.Equal grip strength. Sensation intact.  No visualized jerking. Shuffling gait with walker.   Skin: Skin is warm.  Psychiatric: He has a normal mood and affect. His behavior is normal.  Nursing note and vitals reviewed.   ED Course  Procedures (including critical care time) Labs Review Labs Reviewed  COMPREHENSIVE METABOLIC PANEL - Abnormal; Notable for the following:    Potassium 3.5 (*)    Glucose, Bld 141 (*)    GFR calc non Af Amer 66 (*)    GFR calc Af Amer 76 (*)    All other components within normal limits  URINALYSIS, ROUTINE W REFLEX MICROSCOPIC - Abnormal; Notable for the following:    Nitrite POSITIVE (*)    Leukocytes, UA MODERATE (*)    All other components within normal limits  URINE MICROSCOPIC-ADD ON - Abnormal; Notable for the following:    Bacteria, UA MANY (*)     All other components within normal limits  URINE CULTURE  CBC WITH DIFFERENTIAL  TROPONIN I  TROPONIN I  GLUCOSE, CAPILLARY  TSH  TROPONIN I  TROPONIN I  HEMOGLOBIN A1C  VITAMIN B12  CBC  COMPREHENSIVE METABOLIC PANEL  VITAMIN Z02  TSH    Imaging Review Dg Chest 2 View  01/26/2014   CLINICAL DATA:  Tremors, vertigo  EXAM: CHEST  2 VIEW  COMPARISON:  Chest x-ray of 01/02/2014 and CT chest of the same date  FINDINGS: Mild basilar atelectasis is present. No focal infiltrate or effusion is seen. Cardiomegaly is stable. Mediastinal and hilar contours are unchanged. There are degenerative changes noted in the shoulders.  IMPRESSION: Stable cardiomegaly.  No active lung disease.   Electronically Signed   By: Ivar Drape M.D.   On: 01/26/2014 13:09   Ct Head Wo Contrast  01/26/2014   CLINICAL DATA:  Tremor, episodes of dizziness  EXAM: CT HEAD WITHOUT CONTRAST  TECHNIQUE: Contiguous axial images were obtained from the base of the skull through the vertex without intravenous contrast.  COMPARISON:  CT brain scan of 01/02/2014  FINDINGS: The ventricular system remains slightly prominent as are the cortical sulci consistent with atrophy. Somewhat prominent CSF spaces primarily bifrontally and within the interhemispheric fissure are unchanged. No hemorrhage, mass lesion, or acute infarction is seen. On bone window images, some fluid is again noted layering dependently within the right mastoid air cells which are diminished in number. No calvarial abnormality is seen.  IMPRESSION: 1. No change in atrophy.  No acute intracranial abnormality. 2. No change in small amount of fluid layering within right mastoid air cells as described previously   Electronically Signed   By: Ivar Drape M.D.   On: 01/26/2014 13:01     EKG Interpretation   Date/Time:  Thursday January 26 2014 11:56:36 EST Ventricular Rate:  72 PR Interval:  157 QRS Duration: 113 QT Interval:  445 QTC Calculation: 487 R Axis:    41 Text Interpretation:  Sinus rhythm Borderline intraventricular conduction  delay Abnormal inferior Q waves Borderline prolonged QT interval No  significant change was found Confirmed by Ferris [phone]) on  01/26/2014 12:13:34 PM      MDM   Final diagnoses:  Shaking  Urinary tract infection without hematuria, site unspecified  Patient from nursing home with intermittent episodes of involuntary jerking movement since last night. History of same with extensive workup.  Previous hospitalization records and [facility] records reviewed. Patient had normal MRI and EEG  Case discussed with Dr. Janann Colonel of neurology. He agrees episodes unlikely to be seizures. He states with UTI, patient could have a lower threshold for these episodes. Recommends increasing benzodiazepine as needed and treating UTI.  D/w Dr. Allyson Sabal. She does not feel patient needs admission as he is hemodynamically stable. And not septic appearing. She recommends treating UTI with Cipro.  Dr. Janann Colonel recommends outpatient EMG.  Family uncomfortable with patient going back to nursing home.  They "want to know what is going on" and don't seem to accept previous diagnosis of startle myoclonus. Dr. Allyson Sabal agreeable to admit to observation.  Dr. Janann Colonel to consult.   Ezequiel Essex, MD 01/26/14 [phone]

## 2014-01-26 NOTE — Telephone Encounter (Signed)
Patient's daughter called to let you know that patient is at [facility].  Patient had tremors last night for 1 1/2 hours and he had tremors this morning.  They think patient fell in the shower this morning.

## 2014-01-26 NOTE — ED Notes (Signed)
Bed: WA22 Expected date:  Expected time:  Means of arrival:  Comments: EMS 

## 2014-01-26 NOTE — ED Notes (Addendum)
Patient comes from [place] with complaints of tremor.  Patient states he had an episode of dizziness in November and called 911.  He was taken to [facility] [place] and evaluated there.  Patient was also sent to [facility] and was dx with tonic "startles."  Patient and family state patient had tremors last night and this morning, so they asked SNF staff to call 911.  Plan for patient was that he return home (where he lives alone), but patient has had an unsteady gait and family is concerned he might fall due to the tremors.  On exam, patient's lung sounds are clear, heart sounds unremarkable, no S3, S4 or split noted.  Bowel sounds normoactive and abdomen is soft and non tender to palpation.  +2 radial and dorsalis pedis pulses. CNIII intact.  Patient is able to move all extremities.  Tongue midline, grips are equal bilaterally.  Patient's mucus membranes are moist and he appears well hydrated.

## 2014-01-26 NOTE — Telephone Encounter (Signed)
Noted, thanks. I hate to hear that he's had more trouble.  I'll await the ER notes.

## 2014-01-26 NOTE — ED Notes (Signed)
Attempted to call report to [place]

## 2014-01-26 NOTE — H&P (Addendum)
[facility] History and Physical  JERIS ROSER NLZ:[id number] DOB: [birth year] DOA: 01/26/2014  Referring physician:  PCP: [name], MD   Chief Complaint:   HPI:  79 y.o. male who presented initially to [facility] prior to transfer to [facility] on 01/06/2014 with episodes of shaking and instability when walking. For full details of admission, please refer to H&P by Dr. Mikeal Hawthorne. In brief, the patient was in his usual state of health until 12/15/2013 when he began to have dizziness and instability when walking as well as episodes of shaking that occur when going from lying to sitting or standing. Workup at his initial OSH visit included MRI brain 11/5 which was reported as NAICA with moderate generalized cerebral atrophy.CTA head and neck 11/6 showed no areas of significant extracranial or intracranial irregularity or flow-limiting stenosis. Orthostatic vitals were negative and he was started on valium 2.5mg  bid. He was discharged to a SNF with vestibular therapy where he later had a fall and was readmitted to the OSH on 11/23 where workup included CT head (NAICA), negative CXR, CTA chest which showed no PE and mild cardiomegaly, TTE (EF35-40%, mild LVH, diffuse hypokinesis, grade I diastolic dysfunction). EEG was done with report as follows: This is a normal awake and drowsy prolonged video EEG monitoring. Three episodes of wooziness were captures as well as five episodes of jerking/shaking. No change in the background rhythm was noted with any of these episodes. No epileptiform activity was noted. He was then transferred to [facility] on 11/27 where he was admitted to the general neurology service for further evaluation and management.   Hospital Course: Further work up included MRI brain which showed no acute intracranial abnormality. EKG showed sinus rhythm with LVH. He was monitored on telemetry which showed sinus rhythm with some PVCs and some periods of bradycardia with HR 49-50s. Orthostatics were  negative. Labs included normal vitamin B12 (221), CK (69), and TSH (4.036). Serum paraneoplastic panel was pending at the time of discharge. 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 The patient worked with PT and OT during his hospitalization who recommended SNF. On 01/10/2014 the patient was deemed medically stable for discharge to SNF with close neurology and PCP follow up.    At SNF , Family states that the patient has had recurrent episodes of shaking of bilateral upper extremities even when the patient is sitting in a recliner. He denies any symptoms of vertigo. He does have difficulty with ambulation and balance. He was not aware of B-12 levels being low during his last hospitalization. He had one episode yesterday that lasted from 6 PM to 7:30 PM and another episode that lasted 45 minutes this morning. During these episodes the patient is shaking in his upper extremities and his eyes rolled back.    Review of Systems: negative for the following  Constitutional: Denies fever, chills, diaphoresis, appetite change and fatigue.  HEENT: Denies photophobia, eye pain, redness, hearing loss, ear pain, congestion, sore throat, rhinorrhea, sneezing, mouth sores, trouble swallowing, neck pain, neck stiffness and tinnitus.  Respiratory: Denies SOB, DOE, cough, chest tightness, and wheezing.  Cardiovascular: Denies chest pain, palpitations and leg swelling.  Gastrointestinal: Denies nausea, vomiting, abdominal pain, diarrhea, constipation, blood in stool and abdominal distention.  Genitourinary: Denies dysuria, urgency, frequency, hematuria, flank pain and difficulty urinating.  Musculoskeletal: Denies myalgias, back  pain, joint swelling, arthralgias and gait problem.  Skin: Denies pallor, rash and wound.  Neurological: Dizziness with  bilateral upper and lower extremities, seizures, syncope, weakness, light-headedness, numbness and headaches.  Hematological: Denies adenopathy. Easy bruising, personal or family bleeding history  Psychiatric/Behavioral: Denies suicidal ideation, mood changes, confusion, nervousness, sleep disturbance and agitation       Past Medical History  Diagnosis Date  . CAD (coronary artery disease)   . History of syncope   . HTN (hypertension)   . Borderline hyperlipidemia   . History of gastritis   . Diverticulosis of colon   . Colon polyps   . Hypertrophy of prostate with urinary obstruction and other lower urinary tract symptoms (LUTS)   . History of pyelonephritis   . DJD (degenerative joint disease)   . Anxiety   . DM (diabetes mellitus)     Adult onset  . 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 History of kidney stones      Past Surgical History  Procedure Laterality Date  . Appendectomy    . Cataract extraction      RIGHT EYE  . Knee surgery      bilateral ARTHROSCOPY X2 TO EACH KNEE  . Shoulder surgery  04/2005    left by Dr. Berenice Primas  . 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 S/p cysto & stents for kidnedy stone 11/08 by dr. Reece Agar    . Cystoscopy  04/17/11    stent placed  . Cystoscopy w/ ureteral stent placement  04/17/2011    Procedure: CYSTOSCOPY WITH RETROGRADE PYELOGRAM/URETERAL STENT PLACEMENT;  Surgeon: Hanley Ben, MD;  Location: [facility];  Service: Urology;  Laterality: Left;  . Cystoscopy with retrograde pyelogram, ureteroscopy and stent placement Left 04/18/2013    Procedure: CYSTOSCOPY WITH LEFT RETROGRADE PYELOGRAM, LEFT URETEROSCOPY AND LASER LITHOTRIPSY LEFT STENT PLACEMENT;  Surgeon: Dutch Gray, MD;  Location: [facility];  Service: Urology;  Laterality: Left;  . Holmium laser application Left 03/19/348    Procedure: HOLMIUM LASER  APPLICATION;  Surgeon: Dutch Gray, MD;  Location: [facility];  Service: Urology;  Laterality: Left;      Social History:  reports that he has never smoked. He has never used smokeless tobacco. He reports that he does not drink alcohol or use illicit drugs.    Allergies  Allergen Reactions  . Codeine Phosphate Nausea Only    Can take with food and usually doesn't cause nausea  . Morphine Nausea Only    Can take with food and usually doesn't cause nausea    Family History  Problem Relation Age of Onset  . Heart attack Mother   . Dementia Brother   . Colon cancer Neg Hx   . Prostate cancer Neg Hx      Prior to Admission medications   Medication Sig Start Date End Date Taking? Authorizing Provider  acetaminophen (TYLENOL) 650 MG CR tablet Take 650 mg by mouth every 4 (four) hours as needed for pain or fever.   Yes Historical Provider, MD  amLODipine (NORVASC) 10 MG tablet Take 1 tablet (10 mg total) by mouth every morning. 11/10/13  Yes Tonia Ghent, MD  aspirin EC 81 MG tablet Take 81 mg by mouth every morning.    Yes Historical Provider, MD  clonazePAM (KLONOPIN) 1 MG tablet Take 1 mg by  mouth 2 (two) times daily.   Yes Historical Provider, MD  glimepiride (AMARYL) 4 MG tablet Take 1 tablet (4 mg total) by mouth daily with breakfast. 01/13/14  Yes Tonia Ghent, MD  hydrALAZINE (APRESOLINE) 50 MG tablet Take 50 mg by mouth 3 (three) times daily.   Yes Historical Provider, MD  insulin aspart (NOVOLOG) 100 UNIT/ML injection Inject 4-10 Units into the skin 3 (three) times daily before meals. Patient taking differently: Inject 0-10 Units into the skin 3 (three) times daily before meals. SSI: <150 = 0 units, 151-200 = 4 units, 201-250 units, 251-300 = 8 units, >300 = 10 units 01/13/14  Yes Tonia Ghent, MD  Insulin Glargine ([name] SOLOSTAR) 300 UNIT/ML SOPN Inject 20 Units into the skin at bedtime.   Yes Historical Provider, MD  lisinopril-hydrochlorothiazide (PRINZIDE,ZESTORETIC)  20-25 MG per tablet Take 1 tablet by mouth every morning.    Yes Historical Provider, MD  metFORMIN (GLUCOPHAGE) 1000 MG tablet Take 1 tablet (1,000 mg total) by mouth 2 (two) times daily with a meal. 12/19/13  Yes Maryann Mikhail, DO  metoprolol (LOPRESSOR) 50 MG tablet Take 1 tablet (50 mg total) by mouth 2 (two) times daily. 11/10/13  Yes Tonia Ghent, MD  pravastatin (PRAVACHOL) 40 MG tablet Take 1 tablet (40 mg total) by mouth at bedtime. Patient taking differently: Take 40 mg by mouth every morning.  11/10/13  Yes Tonia Ghent, MD  tamsulosin (FLOMAX) 0.4 MG CAPS capsule Take 0.4 mg by mouth daily after supper.   Yes Historical Provider, MD     Physical Exam: Filed Vitals:   01/26/14 1422 01/26/14 1452 01/26/14 1453 01/26/14 1540  BP: 137/67 133/69  136/66  Pulse: 68 69  67  Temp:    97.8 F (36.6 C)  TempSrc:    Oral  Resp: 15 22  22   Height:    5\' 11"  (1.803 m)  Weight:    103.6 kg (228 lb 6.3 oz)  SpO2: 100% 91% 92% 91%     Constitutional: Vital signs reviewed. Patient is a well-developed and well-nourished in no acute distress and cooperative with exam. Alert and oriented x3.  Head: Normocephalic and atraumatic  Ear: TM normal bilaterally  Mouth: no erythema or exudates, MMM  Eyes: PERRL, EOMI, conjunctivae normal, No scleral icterus.  Neck: Supple, Trachea midline normal ROM, No JVD, mass, thyromegaly, or carotid bruit present.  Cardiovascular: RRR, S1 normal, S2 normal, no MRG, pulses symmetric and intact bilaterally  Pulmonary/Chest: CTAB, no wheezes, rales, or rhonchi  Abdominal: Soft. Non-tender, non-distended, bowel sounds are normal, no masses, organomegaly, or guarding present.  GU: no CVA tenderness Musculoskeletal: No joint deformities, erythema, or stiffness, ROM full and no nontender Ext: no edema and no cyanosis, pulses palpable bilaterally (DP and PT)  Hematology: no cervical, inginal, or axillary adenopathy.  Neurological: A&O x3, Sensory: Pinprick and  light touch intact, no vibratory sensation in the ankles and decreased proprioception in toes.  Deep Tendon Reflexes Skin: Warm, dry and intact. No rash, cyanosis, or clubbing.  Psychiatric: Normal mood and affect. speech and behavior is normal. Judgment and thought content normal. Cognition and memory are normal.       Labs on Admission:    Basic Metabolic Panel:  Recent Labs Lab 01/26/14 1145  NA 137  K 3.5*  CL 99  CO2 24  GLUCOSE 141*  BUN 19  CREATININE 1.06  CALCIUM 9.7   Liver Function Tests:  Recent Labs Lab 01/26/14 1145  AST  19  ALT 30  ALKPHOS 62  BILITOT 0.4  PROT 6.7  ALBUMIN 3.7   No results for input(s): LIPASE, AMYLASE in the last 168 hours. No results for input(s): AMMONIA in the last 168 hours. CBC:  Recent Labs Lab 01/26/14 1145  WBC 5.8  NEUTROABS 3.0  HGB 14.8  HCT 44.2  MCV 95.9  PLT 317   Cardiac Enzymes:  Recent Labs Lab 01/26/14 1145  TROPONINI <0.30    BNP (last 3 results) No results for input(s): PROBNP in the last 8760 hours.    CBG:  Recent Labs Lab 01/26/14 1709  GLUCAP 75    Radiological Exams on Admission: Dg Chest 2 View  01/26/2014   CLINICAL DATA:  Tremors, vertigo  EXAM: CHEST  2 VIEW  COMPARISON:  Chest x-ray of 01/02/2014 and CT chest of the same date  FINDINGS: Mild basilar atelectasis is present. No focal infiltrate or effusion is seen. Cardiomegaly is stable. Mediastinal and hilar contours are unchanged. There are degenerative changes noted in the shoulders.  IMPRESSION: Stable cardiomegaly.  No active lung disease.   Electronically Signed   By: Ivar Drape M.D.   On: 01/26/2014 13:09   Ct Head Wo Contrast  01/26/2014   CLINICAL DATA:  Tremor, episodes of dizziness  EXAM: CT HEAD WITHOUT CONTRAST  TECHNIQUE: Contiguous axial images were obtained from the base of the skull through the vertex without intravenous contrast.  COMPARISON:  CT brain scan of 01/02/2014  FINDINGS: The ventricular system  remains slightly prominent as are the cortical sulci consistent with atrophy. Somewhat prominent CSF spaces primarily bifrontally and within the interhemispheric fissure are unchanged. No hemorrhage, mass lesion, or acute infarction is seen. On bone window images, some fluid is again noted layering dependently within the right mastoid air cells which are diminished in number. No calvarial abnormality is seen.  IMPRESSION: 1. No change in atrophy.  No acute intracranial abnormality. 2. No change in small amount of fluid layering within right mastoid air cells as described previously   Electronically Signed   By: Ivar Drape M.D.   On: 01/26/2014 13:01    EKG: Independently reviewed.  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     DM II cbgs well controlled in house on lantus and sliding scale. A1c 7.5 on 10/1 Continue glimiperide    Hypertension Moderately controlled. Medications adjusted by Cardiology during last admission. Currently on coreg, hydralazine, hctz, amlodipine, and lisinopril. Per cards if orthostatic - would drop hydralazine. Outpatient workup, recommended. Patient has an upcoming appointment with Dr. Luther Parody   Chronic systolic heart failure.  Compensated, EF 35-40% by echo.  Likely chronic per Cards consultatation. Outpatient lexiscan cardiolite recommended. Will need outpatient cardiology follow-up for outpatient stress test.Family requesting to see cardiology in the hospitalfor  ischemia workup On aspirin and ACE inhibitor and now placed on Coreg as well.    Peripheral neuropathy.  placed on Neurontin     Code Status:   full Family Communication: bedside with sister and daughter Disposition Plan: admit  Time spent: 70 mins   [facility] Pager [phone]  If 7PM-7AM, please contact night-coverage [email or web address] Password [facility] 01/26/2014, 5:47 PM

## 2014-01-26 NOTE — Consult Note (Signed)
Consult Reason for Consult:myoclonus Referring Physician: Dr Wyvonnia Dusky  CC: Myoclonus  HPI: [name] is an 79 y.o. male presents from nursing home with episodes of generalized shaking of arms and legs intermittently occuring since last night. Family describes episodes of jerking of bilateral hands and feet on and off for one hour last night and one on and off for one hour in this morning. Patient maintains awareness during these episodes. This is similar to last month when he was admitted to [facility]. 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 He's been taking this has not had further episodes until last night. Patient denies any recent falls. Denies any headache, chest pain, shortness of breath. No abdominal pain, nausea or vomiting. No focal weakness, numbness or tingling. No dizziness. Good by mouth intake and urine output.  While examining patient he was noted to have multiple episodes of extremity jerking, appeared myoclonic in nature. Lasting around 10-15 seconds each. He remained alert and responsive during these episodes. Not triggered by movement. Not able to suppress by holding extremity. Appeared to slightly improve with extension of extremities.   Past Medical History  Diagnosis Date  . CAD (coronary artery disease)   . History of syncope   . HTN (hypertension)   . Borderline hyperlipidemia   . History of gastritis   . Diverticulosis of colon   . Colon polyps   . Hypertrophy of prostate with urinary obstruction and other lower urinary tract symptoms (LUTS)   . History of pyelonephritis   . DJD (degenerative joint disease)   . Anxiety   . DM (diabetes mellitus)     Adult onset  . 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 History of kidney stones     Past Surgical History  Procedure Laterality Date  . Appendectomy    . Cataract extraction      RIGHT EYE  . Knee  surgery      bilateral ARTHROSCOPY X2 TO EACH KNEE  . Shoulder surgery  04/2005    left by Dr. Berenice Primas  . 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 S/p cysto & stents for kidnedy stone 11/08 by dr. Reece Agar    . Cystoscopy  04/17/11    stent placed  . Cystoscopy w/ ureteral stent placement  04/17/2011    Procedure: CYSTOSCOPY WITH RETROGRADE PYELOGRAM/URETERAL STENT PLACEMENT;  Surgeon: Hanley Ben, MD;  Location: [facility];  Service: Urology;  Laterality: Left;  . Cystoscopy with retrograde pyelogram, ureteroscopy and stent placement Left 04/18/2013    Procedure: CYSTOSCOPY WITH LEFT RETROGRADE PYELOGRAM, LEFT URETEROSCOPY AND LASER LITHOTRIPSY LEFT STENT PLACEMENT;  Surgeon: Dutch Gray, MD;  Location: [facility];  Service: Urology;  Laterality: Left;  . Holmium laser application Left 07/14/4032    Procedure: HOLMIUM LASER APPLICATION;  Surgeon: Dutch Gray, MD;  Location: [facility];  Service: Urology;  Laterality: Left;    Family History  Problem Relation Age of Onset  . Heart attack Mother   . Dementia Brother   . Colon cancer Neg Hx   . Prostate cancer Neg Hx     Social History:  reports that he has never smoked. He has never used smokeless tobacco. He reports that he does not drink alcohol or use illicit drugs.  Allergies  Allergen Reactions  . Codeine  Phosphate Nausea Only    Can take with food and usually doesn't cause nausea  . Morphine Nausea Only    Can take with food and usually doesn't cause nausea    Medications:  Scheduled: . [START ON 01/27/2014] amLODipine  10 mg Oral q morning - 10a  . [START ON 01/27/2014] aspirin EC  81 mg Oral q morning - 10a  . [START ON 01/27/2014] cefTRIAXone (ROCEPHIN)  IV  1 g Intravenous Q24H  . clonazePAM  1 mg Oral BID  . enoxaparin (LOVENOX) injection  50 mg Subcutaneous Q24H  . [START ON 01/27/2014] glimepiride  4 mg Oral Q breakfast  . hydrALAZINE  50 mg Oral TID  . [START ON  01/27/2014] insulin aspart  0-9 Units Subcutaneous TID WC  . insulin glargine  20 Units Subcutaneous QHS  . levETIRAcetam  500 mg Intravenous Once  . [START ON 01/27/2014] levETIRAcetam  500 mg Oral BID  . metoprolol  50 mg Oral BID  . [START ON 01/27/2014] pravastatin  40 mg Oral QHS  . sodium chloride  3 mL Intravenous Q12H  . tamsulosin  0.4 mg Oral QPC supper    Head CT imaging reviewed and overall unremarkable.   ROS: Out of a complete 14 system review, the patient complains of only the following symptoms, and all other reviewed systems are negative. + abnormal movements  Physical Examination: Filed Vitals:   01/26/14 1452  BP: 133/69  Pulse: 69  Temp:   Resp: 22   Physical Exam  Constitutional: He appears well-developed and well-nourished.  Psych: Affect appropriate to situation Eyes: No scleral injection HENT: No OP obstrucion Head: Normocephalic.  Cardiovascular: Normal rate and regular rhythm.  Respiratory: Effort normal and breath sounds normal.  GI: Soft. Bowel sounds are normal. No distension. There is no tenderness.  Skin: WDI  Neurologic Examination Mental Status: Mental Status: Alert, oriented, thought content appropriate. Speech fluent without evidence of aphasia. Able to follow 3 step commands without difficulty. Cranial Nerves: II: Discs flat bilaterally; Visual fields grossly normal, pupils equal, round, reactive to light and accommodation III,IV, VI: ptosis not present, extra-ocular motions intact bilaterally V,VII: smile symmetric, facial light touch sensation normal bilaterally VIII: hearing normal bilaterally IX,X: gag reflex present XI: bilateral shoulder shrug XII: midline tongue extension without atrophy or fasciculations  Motor: Right :Upper extremity 5/5Left: Upper extremity 5/5 Lower extremity 5/5Lower extremity  5/5 Tone and bulk:normal tone throughout; no atrophy noted Sensory: Pinprick and light touch intact, no vibratory sensation in the ankles and decreased proprioception in toes.  Deep Tendon Reflexes:  Right: Upper Extremity Left: Upper extremity   biceps (C-5 to C-6) 2/4 biceps (C-5 to C-6) 2/4 tricep (C7) 2/4triceps (C7) 2/4 Brachioradialis (C6) 2/4Brachioradialis (C6) 2/4  Lower Extremity Lower Extremity  quadriceps (L-2 to L-4) 1/4 quadriceps (L-2 to L-4) 1/4 Achilles (S1) 0/4Achilles (S1) 0/4  Plantars: Right: downgoingLeft: downgoing Cerebellar: normal finger-to-nose, normal heel-to-shin test Gait: not tested.  Laboratory Studies:   Basic Metabolic Panel:  Recent Labs Lab 01/26/14 1145  NA 137  K 3.5*  CL 99  CO2 24  GLUCOSE 141*  BUN 19  CREATININE 1.06  CALCIUM 9.7    Liver Function Tests:  Recent Labs Lab 01/26/14 1145  AST 19  ALT 30  ALKPHOS 62  BILITOT 0.4  PROT 6.7  ALBUMIN 3.7   No results for input(s): LIPASE, AMYLASE in the last 168 hours. No results for input(s): AMMONIA in the last 168 hours.  CBC:  Recent Labs  Lab 01/26/14 1145  WBC 5.8  NEUTROABS 3.0  HGB 14.8  HCT 44.2  MCV 95.9  PLT 317    Cardiac Enzymes:  Recent Labs Lab 01/26/14 1145  TROPONINI <0.30    BNP: Invalid input(s): POCBNP  CBG: No results for input(s): GLUCAP in the last 168 hours.  Microbiology: Results for orders placed or performed during the hospital encounter of 01/02/14  Urine culture     Status: None   Collection Time: 01/02/14 12:01 PM  Result Value Ref Range Status   Specimen Description URINE, CATHETERIZED  Final   Special Requests NONE  Final   Culture  Setup Time   Final    01/02/2014 17:05 Performed at [facility]   Final     >=100,000 COLONIES/ML Performed at [facility] Performed at [facility]    Report Status 01/04/2014 FINAL  Final   Organism ID, Bacteria CITROBACTER FREUNDII  Final      Susceptibility   Citrobacter freundii - MIC*    CEFAZOLIN RESISTANT      CEFTRIAXONE <=1 SENSITIVE Sensitive     CIPROFLOXACIN 0.5 SENSITIVE Sensitive     GENTAMICIN <=1 SENSITIVE Sensitive     LEVOFLOXACIN 1 SENSITIVE Sensitive     NITROFURANTOIN <=16 SENSITIVE Sensitive     TOBRAMYCIN <=1 SENSITIVE Sensitive     TRIMETH/SULFA <=20 SENSITIVE Sensitive     PIP/TAZO <=4 SENSITIVE Sensitive     * CITROBACTER FREUNDII  MRSA PCR Screening     Status: None   Collection Time: 01/02/14  4:18 PM  Result Value Ref Range Status   MRSA by PCR NEGATIVE NEGATIVE Final    Comment:        The GeneXpert MRSA Assay (FDA approved for NASAL specimens only), is one component of a comprehensive MRSA colonization surveillance program. It is not intended to diagnose MRSA infection nor to guide or monitor treatment for MRSA infections.     Coagulation Studies: No results for input(s): LABPROT, INR in the last 72 hours.  Urinalysis:  Recent Labs Lab 01/26/14 1217  COLORURINE YELLOW  LABSPEC 1.018  PHURINE 6.0  GLUCOSEU NEGATIVE  HGBUR NEGATIVE  BILIRUBINUR NEGATIVE  KETONESUR NEGATIVE  PROTEINUR NEGATIVE  UROBILINOGEN 0.2  NITRITE POSITIVE*  LEUKOCYTESUR MODERATE*    Lipid Panel:     Component Value Date/Time   CHOL 126 11/10/2013 0933   TRIG 199.0* 11/10/2013 0933   HDL 32.10* 11/10/2013 0933   CHOLHDL 4 11/10/2013 0933   VLDL 39.8 11/10/2013 0933   LDLCALC 54 11/10/2013 0933    HgbA1C:  Lab Results  Component Value Date   HGBA1C 7.5* 11/10/2013    Urine Drug Screen:  No results found for: LABOPIA, COCAINSCRNUR, LABBENZ, AMPHETMU, THCU, LABBARB  Alcohol Level: No results for input(s): ETH in the last 168 hours.  Other  results:  Imaging: Dg Chest 2 View  01/26/2014   CLINICAL DATA:  Tremors, vertigo  EXAM: CHEST  2 VIEW  COMPARISON:  Chest x-ray of 01/02/2014 and CT chest of the same date  FINDINGS: Mild basilar atelectasis is present. No focal infiltrate or effusion is seen. Cardiomegaly is stable. Mediastinal and hilar contours are unchanged. There are degenerative changes noted in the shoulders.  IMPRESSION: Stable cardiomegaly.  No active lung disease.   Electronically Signed   By: Ivar Drape M.D.   On: 01/26/2014 13:09   Ct Head Wo Contrast  01/26/2014   CLINICAL DATA:  Tremor, episodes of dizziness  EXAM: CT HEAD WITHOUT CONTRAST  TECHNIQUE: Contiguous axial images were obtained from the base of the skull through the vertex without intravenous contrast.  COMPARISON:  CT brain scan of 01/02/2014  FINDINGS: The ventricular system remains slightly prominent as are the cortical sulci consistent with atrophy. Somewhat prominent CSF spaces primarily bifrontally and within the interhemispheric fissure are unchanged. No hemorrhage, mass lesion, or acute infarction is seen. On bone window images, some fluid is again noted layering dependently within the right mastoid air cells which are diminished in number. No calvarial abnormality is seen.  IMPRESSION: 1. No change in atrophy.  No acute intracranial abnormality. 2. No change in small amount of fluid layering within right mastoid air cells as described previously   Electronically Signed   By: Ivar Drape M.D.   On: 01/26/2014 13:01     Assessment/Plan:  79y/o with history of CAD, HTN, HLD, syncope presenting for further evaluation of recurrent episodes of myoclonus of extremities. He has been worked up extensively in the past at [facility] and [facility] with a diagnosis of startle myoclonus.  Unclear etiology of symptoms. He has had negative MRI brain and LTM EEG which captured events and showed no EEG correlation. He was found to have a UTI today which may have lowered his  threshold for the episodes. Based on description of generalized shaking without LOC this does not sound consistent with a seizure disorder.   His B12 level at [facility] was low at 221. Will recheck and consider supplementing. There are rare case reports of low B12 causing myoclonus  -check MRI C spine without contrast -UTI treatment per primary team -check B12, TSH -change klonopin to 0.75mg  TID.  -due to frequent nature of episodes will add keppra 500mg  BID -if MRI C spine unremarkable there is no further inpatient workup indicated at this time. Would discharge with outpatient neurology follow up for EMG/NCS and further workup  Jim Like, DO [facility] [phone]  If 7pm- 7am, please page neurology on call as listed in [place]. 01/26/2014, 2:56 PM

## 2014-01-26 NOTE — ED Notes (Signed)
Per EMS - patient comes from [facility] in [place].  Patient presents with c/o tremors since mid-November.  Patient has been evaluated at [facility] and [facility] with a diagnosis of vertigo.  Patient's vitals were 146/71, HR 68, O2 sats 96 on 3L.  CBG 212.  Patient is alert & oriented per baseline according to SNF staff.

## 2014-01-26 NOTE — Progress Notes (Signed)
Received report from  ED RN, Pt arrived unit accompanied by family. MD notified of Pt's location, will continue with current plan of care.

## 2014-01-26 NOTE — Progress Notes (Signed)
Clinical Social Work Department BRIEF PSYCHOSOCIAL ASSESSMENT 01/26/2014  Patient:  [name], [name]     Account Number:  [id number]     Admit date:  01/26/2014  Clinical Social Worker:  Kiara Mcdowell Inez Catalina  Date/Time:  01/26/2014 12:00 M  Referred by:  CSW  Date Referred:  01/26/2014 Referred for  SNF Placement   Other Referral:   Interview type:  Patient Other interview type:    PSYCHOSOCIAL DATA Living Status:  FACILITY Admitted from facility:  [place] [place] Level of care:  [place] Primary support name:  Nicko Daher Primary support relationship to patient:  CHILD, ADULT Degree of support available:   strong    CURRENT CONCERNS Current Concerns  Post-Acute Placement   Other Concerns:    SOCIAL WORK ASSESSMENT / PLAN CSW met with pt at bedside to complet psychosocial assessment. Pt shared he is currently at [facility]. CSW spoke iwht snf, who shared that patient was scheduled to discharge tomorrow home. Per facility patient rehab can continue if needed. Pt family out of room during assessment. Pt confirmed he would liek to return to [place] when medically stable.   Assessment/plan status:   Other assessment/ plan:   Information/referral to community resources:    PATIENT'S/FAMILY'S RESPONSE TO PLAN OF CARE: Pt thanked csw for concern and support. Pt plans to return to [facility] when medically  stable.       Noreene Larsson [phone]  ED CSW 01/26/2014 2:06 PM

## 2014-01-26 NOTE — ED Notes (Signed)
Patient was given an urinal and encouraged to void when able.

## 2014-01-27 ENCOUNTER — Inpatient Hospital Stay (HOSPITAL_COMMUNITY): Payer: Medicare Other

## 2014-01-27 DIAGNOSIS — M1990 Unspecified osteoarthritis, unspecified site: Secondary | ICD-10-CM | POA: Diagnosis present

## 2014-01-27 DIAGNOSIS — I517 Cardiomegaly: Secondary | ICD-10-CM

## 2014-01-27 DIAGNOSIS — N189 Chronic kidney disease, unspecified: Secondary | ICD-10-CM | POA: Diagnosis present

## 2014-01-27 DIAGNOSIS — I5042 Chronic combined systolic (congestive) and diastolic (congestive) heart failure: Secondary | ICD-10-CM | POA: Diagnosis present

## 2014-01-27 DIAGNOSIS — G253 Myoclonus: Secondary | ICD-10-CM | POA: Diagnosis present

## 2014-01-27 DIAGNOSIS — I129 Hypertensive chronic kidney disease with stage 1 through stage 4 chronic kidney disease, or unspecified chronic kidney disease: Secondary | ICD-10-CM | POA: Diagnosis present

## 2014-01-27 DIAGNOSIS — I2510 Atherosclerotic heart disease of native coronary artery without angina pectoris: Secondary | ICD-10-CM | POA: Diagnosis present

## 2014-01-27 DIAGNOSIS — G629 Polyneuropathy, unspecified: Secondary | ICD-10-CM | POA: Diagnosis present

## 2014-01-27 DIAGNOSIS — E1129 Type 2 diabetes mellitus with other diabetic kidney complication: Secondary | ICD-10-CM | POA: Diagnosis present

## 2014-01-27 DIAGNOSIS — Z885 Allergy status to narcotic agent status: Secondary | ICD-10-CM | POA: Diagnosis not present

## 2014-01-27 DIAGNOSIS — I252 Old myocardial infarction: Secondary | ICD-10-CM | POA: Diagnosis not present

## 2014-01-27 DIAGNOSIS — Z86010 Personal history of colonic polyps: Secondary | ICD-10-CM | POA: Diagnosis not present

## 2014-01-27 DIAGNOSIS — E1121 Type 2 diabetes mellitus with diabetic nephropathy: Secondary | ICD-10-CM

## 2014-01-27 DIAGNOSIS — F329 Major depressive disorder, single episode, unspecified: Secondary | ICD-10-CM | POA: Diagnosis present

## 2014-01-27 DIAGNOSIS — N390 Urinary tract infection, site not specified: Secondary | ICD-10-CM | POA: Diagnosis present

## 2014-01-27 DIAGNOSIS — E785 Hyperlipidemia, unspecified: Secondary | ICD-10-CM | POA: Diagnosis present

## 2014-01-27 DIAGNOSIS — Z9841 Cataract extraction status, right eye: Secondary | ICD-10-CM | POA: Diagnosis not present

## 2014-01-27 DIAGNOSIS — E1122 Type 2 diabetes mellitus with diabetic chronic kidney disease: Secondary | ICD-10-CM | POA: Diagnosis present

## 2014-01-27 DIAGNOSIS — N400 Enlarged prostate without lower urinary tract symptoms: Secondary | ICD-10-CM | POA: Diagnosis present

## 2014-01-27 DIAGNOSIS — Z7982 Long term (current) use of aspirin: Secondary | ICD-10-CM | POA: Diagnosis not present

## 2014-01-27 DIAGNOSIS — Z794 Long term (current) use of insulin: Secondary | ICD-10-CM | POA: Diagnosis not present

## 2014-01-27 LAB — COMPREHENSIVE METABOLIC PANEL
ALK PHOS: 56 U/L (ref 39–117)
ALT: 27 U/L (ref 0–53)
ANION GAP: 13 (ref 5–15)
AST: 16 U/L (ref 0–37)
Albumin: 3.4 g/dL — ABNORMAL LOW (ref 3.5–5.2)
BILIRUBIN TOTAL: 0.3 mg/dL (ref 0.3–1.2)
BUN: 16 mg/dL (ref 6–23)
CO2: 26 meq/L (ref 19–32)
CREATININE: 1.08 mg/dL (ref 0.50–1.35)
Calcium: 9.1 mg/dL (ref 8.4–10.5)
Chloride: 104 mEq/L (ref 96–112)
GFR calc Af Amer: 74 mL/min — ABNORMAL LOW (ref >90)
GFR, EST NON AFRICAN AMERICAN: 64 mL/min — AB (ref >90)
Glucose, Bld: 108 mg/dL — ABNORMAL HIGH (ref 70–99)
Potassium: 3.2 mEq/L — ABNORMAL LOW (ref 3.7–5.3)
Sodium: 143 mEq/L (ref 137–147)
Total Protein: 6.1 g/dL (ref 6.0–8.3)

## 2014-01-27 LAB — HEMOGLOBIN A1C
Hgb A1c MFr Bld: 7.3 % — ABNORMAL HIGH (ref <5.7)
Mean Plasma Glucose: 163 mg/dL — ABNORMAL HIGH (ref <117)

## 2014-01-27 LAB — VITAMIN B12: VITAMIN B 12: 361 pg/mL (ref 211–911)

## 2014-01-27 LAB — CBC
HEMATOCRIT: 42 % (ref 39.0–52.0)
HEMOGLOBIN: 14.3 g/dL (ref 13.0–17.0)
MCH: 32.5 pg (ref 26.0–34.0)
MCHC: 34 g/dL (ref 30.0–36.0)
MCV: 95.5 fL (ref 78.0–100.0)
Platelets: 252 10*3/uL (ref 150–400)
RBC: 4.4 MIL/uL (ref 4.22–5.81)
RDW: 13.6 % (ref 11.5–15.5)
WBC: 5.1 10*3/uL (ref 4.0–10.5)

## 2014-01-27 LAB — GLUCOSE, CAPILLARY
GLUCOSE-CAPILLARY: 98 mg/dL (ref 70–99)
Glucose-Capillary: 150 mg/dL — ABNORMAL HIGH (ref 70–99)
Glucose-Capillary: 121 mg/dL — ABNORMAL HIGH (ref 70–99)

## 2014-01-27 LAB — TSH: TSH: 2.79 u[IU]/mL (ref 0.350–4.500)

## 2014-01-27 LAB — PRO B NATRIURETIC PEPTIDE: PRO B NATRI PEPTIDE: 241.8 pg/mL (ref 0–450)

## 2014-01-27 LAB — TROPONIN I: Troponin I: <0.3 ng/mL (ref <0.30)

## 2014-01-27 MED ORDER — LEVETIRACETAM 500 MG PO TABS
500.0000 mg | ORAL_TABLET | Freq: Two times a day (BID) | ORAL | Status: DC
Start: 2014-01-27 — End: 2014-02-04

## 2014-01-27 MED ORDER — CLONAZEPAM 0.5 MG PO TABS
0.7500 mg | ORAL_TABLET | Freq: Three times a day (TID) | ORAL | Status: DC
  Administered 2014-01-27 – 2014-01-28 (×3): 0.75 mg via ORAL
  Filled 2014-01-27 (×3): qty 2

## 2014-01-27 MED ORDER — CLONAZEPAM 0.5 MG PO TABS: 0.7500 mg | ORAL_TABLET | Freq: Three times a day (TID) | ORAL | Status: DC

## 2014-01-27 MED ORDER — POTASSIUM CHLORIDE 20 MEQ/15ML (10%) PO SOLN
40.0000 meq | Freq: Once | ORAL | Status: AC
  Administered 2014-01-27: 40 meq via ORAL
  Filled 2014-01-27 (×2): qty 30

## 2014-01-27 MED ORDER — POTASSIUM CHLORIDE 20 MEQ/15ML (10%) PO SOLN
40.0000 meq | Freq: Once | ORAL | Status: AC
  Administered 2014-01-27: 40 meq via ORAL
  Filled 2014-01-27: qty 30

## 2014-01-27 NOTE — Discharge Summary (Signed)
Discharge Summary  [name] HUD:[id number] DOB: [birth year]  PCP: [name], MD  Admit date: 01/26/2014 Anticipated Discharge date: 01/28/2014  Time spent: 45 min  Recommendations for Outpatient Follow-up:  1. Medication change: Klonopin changed to 0.75 mg 3 times a day 2. New Medication: Keppra 500 mg by mouth twice a day 3. Cipro 500 mg by mouth twice a day  Discharge Diagnoses:  Active Hospital Problems   Diagnosis Date Noted  . 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 Chronic combined systolic and diastolic congestive heart failure 01/27/2014  . DM type 2 causing renal disease 01/27/2014  . Urinary tract infection 01/26/2014  . Weakness 01/26/2014    Resolved Hospital Problems   Diagnosis Date Noted Date Resolved  No resolved problems to display.    Discharge Condition: Improved, being discharged back to skilled nursing  Diet recommendation: Heart modified, heart healthy  Filed Weights   01/26/14 1102 01/26/14 1540  Weight: 107.956 kg (238 lb) 103.6 kg (228 lb 6.3 oz)    History of present illness:  Patient is a 79 year old male with past mental history of diabetes mellitus and chronic systolic/diastolic heart failure who in early November was admitted to the hospitalist service for persistent jerking-like tremors of arms and legs. Patient underwent an extensive workup which was for the most part unrevealing ruling out seizures and CVA.  He eventually was transferred to [facility] for further workup and from there, it was felt that he had a condition called startle myoclonus.  Patient was started on twice a day Klonopin and discharged to a short-term skilled nursing facility.  Once Klonopin was started, patient had no further episodes until 12/17, and symptoms returned. Patient was brought back into the emergency room for further evaluation.  Patient was noted to have an acute UTI on admission. Seen by neurology and hospitalists consulted for further evaluation and  admission  Hospital Course:  Principal Problem:   Startle myoclonus: Recurrence of symptoms felt to be secondary to urinary tract infection lowering threshold. Seen by neurology who recommended treatment of urinary tract infection. They recommended changing Klonopin dose from 1 mg twice a day to 0.75 mg by mouth 3 times a day and adding Keppra 500 mg by mouth twice a day. They also recommended checking TSH and B-12 levels which can be causes, both of which were normal. Neurology also recommended checking MRI of cervical spine looking for lesion and if this was unremarkable, given previous extensive workup, no further workup was recommended.  Active Problems:   Urinary tract infection: Awaiting cultures, on IV Rocephin. Preliminary discharge plan is for Cipro   Weakness: Prior to this admission, patient was close to discharge from skilled nursing facility. Seen by physical therapy here who recommending going back to short-term skilled nursing after some discussion, patient and his sister are amenable to this plan.   Chronic combined systolic and diastolic congestive heart failure: BNP checked, within normal limits. Plan to discharge on patient's home dose of beta blocker and ARB   DM type 2 causing renal disease: Stable continue home medications on discharge BPH: Continue Flomax   Procedures: Echocardiogram done 85/88: Grade 1 diastolic dysfunction plus mildly decreased ejection fraction of 45-50 percent  Consultations: Neurology  Discharge Exam: BP 140/66 mmHg  Pulse 65  Temp(Src) 97.5 F (36.4 C) (Oral)  Resp 20  Ht 5\' 11"  (1.803 m)  Wt 103.6 kg (228 lb 6.3 oz)  BMI 31.87 kg/m2  SpO2 96%  General: alert and oriented 3, fatigued Cardiovascular:  Regular rate and rhythm, S1-S2 lungs: Clear to auscultation bilaterally  Discharge Instructions You were cared for by a hospitalist during your hospital stay. If you have any questions about your discharge medications or the care you  received while you were in the hospital after you are discharged, you can call the unit and asked to speak with the hospitalist on call if the hospitalist that took care of you is not available. Once you are discharged, your primary care physician will handle any further medical issues. Please note that NO REFILLS for any discharge medications will be authorized once you are discharged, as it is imperative that you return to your primary care physician (or establish a relationship with a primary care physician if you do not have one) for your aftercare needs so that they can reassess your need for medications and monitor your lab values.     Medication List    TAKE these medications        acetaminophen 650 MG CR tablet  Commonly known as:  TYLENOL  Take 650 mg by mouth every 4 (four) hours as needed for pain or fever.     amLODipine 10 MG tablet  Commonly known as:  NORVASC  Take 1 tablet (10 mg total) by mouth every morning.     aspirin EC 81 MG tablet  Take 81 mg by mouth every morning.     clonazePAM 0.5 MG tablet  Commonly known as:  KLONOPIN  Take 1.5 tablets (0.75 mg total) by mouth 3 (three) times daily.     glimepiride 4 MG tablet  Commonly known as:  AMARYL  Take 1 tablet (4 mg total) by mouth daily with breakfast.     hydrALAZINE 50 MG tablet  Commonly known as:  APRESOLINE  Take 50 mg by mouth 3 (three) times daily.     insulin aspart 100 UNIT/ML injection  Commonly known as:  novoLOG  Inject 4-10 Units into the skin 3 (three) times daily before meals.     levETIRAcetam 500 MG tablet  Commonly known as:  KEPPRA  Take 1 tablet (500 mg total) by mouth 2 (two) times daily.     lisinopril-hydrochlorothiazide 20-25 MG per tablet  Commonly known as:  PRINZIDE,ZESTORETIC  Take 1 tablet by mouth every morning.     metFORMIN 1000 MG tablet  Commonly known as:  GLUCOPHAGE  Take 1 tablet (1,000 mg total) by mouth 2 (two) times daily with a meal.     metoprolol 50 MG  tablet  Commonly known as:  LOPRESSOR  Take 1 tablet (50 mg total) by mouth 2 (two) times daily.     pravastatin 40 MG tablet  Commonly known as:  PRAVACHOL  Take 1 tablet (40 mg total) by mouth at bedtime.     tamsulosin 0.4 MG Caps capsule  Commonly known as:  FLOMAX  Take 0.4 mg by mouth daily after supper.     [name] SOLOSTAR 300 UNIT/ML Sopn  Generic drug:  Insulin Glargine  Inject 20 Units into the skin at bedtime.       Allergies  Allergen Reactions  . Codeine Phosphate Nausea Only    Can take with food and usually doesn't cause nausea  . Morphine Nausea Only    Can take with food and usually doesn't cause nausea      The results of significant diagnostics from this hospitalization (including imaging, microbiology, ancillary and laboratory) are listed below for reference.    Significant Diagnostic Studies: Dg Chest  2 View  01/26/2014   CLINICAL DATA:  Tremors, vertigo  EXAM: CHEST  2 VIEW  COMPARISON:  Chest x-ray of 01/02/2014 and CT chest of the same date  FINDINGS: Mild basilar atelectasis is present. No focal infiltrate or effusion is seen. Cardiomegaly is stable. Mediastinal and hilar contours are unchanged. There are degenerative changes noted in the shoulders.  IMPRESSION: Stable cardiomegaly.  No active lung disease.   Electronically Signed   By: Ivar Drape M.D.   On: 01/26/2014 13:09   Dg Lumbar Spine Complete  01/06/2014   CLINICAL DATA:  Back pain, seizure activity, history of old back fracture.  EXAM: LUMBAR SPINE - COMPLETE 4+ VIEW  COMPARISON:  None.  FINDINGS: The lumbar vertebral bodies are preserved in height. There is disc space narrowing at L4-5 and at L5-S1. S1 is transitional. The pedicles and transverse processes are intact where visualized. There are prominent anterior endplate osteophytes at T12-L1, L1-L2, L2-L3, and L3-L4. There is facet joint hypertrophy in the mid and lower lumbar spine. There is no spondylolisthesis. There is calcification in  the wall of the abdominal aorta.  IMPRESSION: There is multilevel degenerative disc and facet joint change of the lumbar spine. There is no compression fracture nor other acute bony abnormality.   Electronically Signed   By: David  Martinique   On: 01/06/2014 10:20   Ct Head Wo Contrast  01/26/2014   CLINICAL DATA:  Tremor, episodes of dizziness  EXAM: CT HEAD WITHOUT CONTRAST  TECHNIQUE: Contiguous axial images were obtained from the base of the skull through the vertex without intravenous contrast.  COMPARISON:  CT brain scan of 01/02/2014  FINDINGS: The ventricular system remains slightly prominent as are the cortical sulci consistent with atrophy. Somewhat prominent CSF spaces primarily bifrontally and within the interhemispheric fissure are unchanged. No hemorrhage, mass lesion, or acute infarction is seen. On bone window images, some fluid is again noted layering dependently within the right mastoid air cells which are diminished in number. No calvarial abnormality is seen.  IMPRESSION: 1. No change in atrophy.  No acute intracranial abnormality. 2. No change in small amount of fluid layering within right mastoid air cells as described previously   Electronically Signed   By: Ivar Drape M.D.   On: 01/26/2014 13:01   Ct Head Wo Contrast  01/02/2014   CLINICAL DATA:  [place] striking head on towel rack this morning, dizziness before fall, head injury, history coronary artery disease post MI, hypertension, diabetes  EXAM: CT HEAD WITHOUT CONTRAST  TECHNIQUE: Contiguous axial images were obtained from the base of the skull through the vertex without intravenous contrast.  COMPARISON:  12/16/2013  FINDINGS: Mild atrophy.  Normal ventricular morphology.  No midline shift or mass effect.  Otherwise normal appearance of brain parenchyma.  No intracranial hemorrhage, mass lesion or evidence acute infarction.  No extra-axial fluid collections.  Atherosclerotic calcifications of the vertebral, basilar and internal  carotid arteries.  Tiny amount of fluid dependently in RIGHT mastoid air cells, unchanged.  Visualized sinuses clear and bones unremarkable.  IMPRESSION: Mild generalized atrophy.  No acute intracranial abnormalities.  No significant interval change.   Electronically Signed   By: Lavonia Dana M.D.   On: 01/02/2014 11:18   Ct Angio Chest Pe W/cm &/or Wo Cm  01/02/2014   CLINICAL DATA:  Syncopal episode.  Shortness of breath.  Hypoxia.  EXAM: CT ANGIOGRAPHY CHEST WITH CONTRAST  TECHNIQUE: Multidetector CT imaging of the chest was performed using the standard protocol  during bolus administration of intravenous contrast. Multiplanar CT image reconstructions and MIPs were obtained to evaluate the vascular anatomy.  CONTRAST:  115mL OMNIPAQUE IOHEXOL 350 MG/ML SOLN  COMPARISON:  None.  FINDINGS: Vascular/Cardiac: Satisfactory opacification of pulmonary arteries is demonstated, and no pulmonary emboli are identified. No evidence of thoracic aortic aneurysm or other significant abnormality. Mild cardiomegaly noted.  Mediastinum/Hilar Regions: No masses or pathologically enlarged lymph nodes identified.  Lungs:  No pulmonary infiltrate or mass identified.  Pleura:  No evidence of effusion or mass.  Musculoskeletal:  No suspicious bone lesions identified.  Other:  None.  Review of the MIP images confirms the above findings.  IMPRESSION: No evidence of pulmonary embolism or other acute findings.  Mild cardiomegaly.   Electronically Signed   By: Earle Gell M.D.   On: 01/02/2014 13:24   Dg Chest Port 1 View  01/02/2014   CLINICAL DATA:  Cough.  EXAM: PORTABLE CHEST - 1 VIEW  COMPARISON:  December 15, 2013.  FINDINGS: The heart size and mediastinal contours are within normal limits. Both lungs are clear. No pneumothorax or pleural effusion is noted. The visualized skeletal structures are unremarkable.  IMPRESSION: No acute cardiopulmonary abnormality seen.   Electronically Signed   By: Sabino Dick M.D.   On: 01/02/2014  11:42    Microbiology: No results found for this or any previous visit (from the past 240 hour(s)).   Labs: Basic Metabolic Panel:  Recent Labs Lab 01/26/14 1145 01/27/14 0512  NA 137 143  K 3.5* 3.2*  CL 99 104  CO2 24 26  GLUCOSE 141* 108*  BUN 19 16  CREATININE 1.06 1.08  CALCIUM 9.7 9.1   Liver Function Tests:  Recent Labs Lab 01/26/14 1145 01/27/14 0512  AST 19 16  ALT 30 27  ALKPHOS 62 56  BILITOT 0.4 0.3  PROT 6.7 6.1  ALBUMIN 3.7 3.4*   No results for input(s): LIPASE, AMYLASE in the last 168 hours. No results for input(s): AMMONIA in the last 168 hours. CBC:  Recent Labs Lab 01/26/14 1145 01/27/14 0512  WBC 5.8 5.1  NEUTROABS 3.0  --   HGB 14.8 14.3  HCT 44.2 42.0  MCV 95.9 95.5  PLT 317 252   Cardiac Enzymes:  Recent Labs Lab 01/26/14 1145 01/26/14 1654 01/26/14 2258 01/27/14 0512  TROPONINI <0.30 <0.30 <0.30 <0.30   BNP: BNP (last 3 results)  Recent Labs  01/27/14 0527  PROBNP 241.8   CBG:  Recent Labs Lab 01/26/14 1709 01/27/14 0810 01/27/14 1119  GLUCAP 75 98 150*       Signed:  Shanyn Preisler K  [facility] 01/27/2014, 4:44 PM

## 2014-01-27 NOTE — Evaluation (Addendum)
Physical Therapy Evaluation Patient Details Name: YOGESH COMINSKY MRN: [id number] DOB: 1934-12-21 Today's Date: 01/27/2014   History of Present Illness  [name] is a 79 y.o. male with PMH of HTN, HPL, DM, CAD, BL mild hearing loss the patient was in his usual state of health until 12/15/2013 when he began to have dizziness and instability when walking as well as episodes of shaking that occur when going from lying to sitting or standing.  Pt has had multiple admitssions to [place] since early Nov. Extensive workup resulted is dx of startle myoclonus.  Prior to this admission he was at [facility] for short term rehab where he walked with a RW. Prior to early Nov he lived at home alone.   Clinical Impression  *Pt admitted with above diagnosis. Pt currently with functional limitations due to the deficits listed below (see PT Problem List). ** Pt will benefit from skilled PT to increase their independence and safety with mobility to allow discharge to the venue listed below.    Pt reports having episode of shaking this morning while walking to the bathroom. He stated he has LOB during these episodes and has to sit. During PT eval he walked 20' with RW with min/guard assist for safety, no shaking nor LOB during eval. Due to high fall risk he would benefit from ST-SNF.  **    Follow Up Recommendations SNF    Equipment Recommendations  None recommended by PT    Recommendations for Other Services       Precautions / Restrictions Precautions Precautions: Fall Restrictions Weight Bearing Restrictions: No      Mobility  Bed Mobility Overal bed mobility: Modified Independent             General bed mobility comments: pulled up on rail  Transfers Overall transfer level: Needs assistance Equipment used: Rolling walker (2 wheeled) Transfers: Sit to/from Stand Sit to Stand: Supervision         General transfer comment: cues for hand placement,  Supervision due to fall risk  Ambulation/Gait Ambulation/Gait assistance: Min guard Ambulation Distance (Feet): 75 Feet Assistive device: Rolling walker (2 wheeled) Gait Pattern/deviations: Step-through pattern;Trunk flexed   Gait velocity interpretation: Below normal speed for age/gender General Gait Details: steady with RW, 2/4 dyspnea with walking SaO2 98% on RA, pt reports having LOB this morning while walking to bathroom when shaking episode began  Stairs            Wheelchair Mobility    Modified Rankin (Stroke Patients Only)       Balance Overall balance assessment: Needs assistance   Sitting balance-Leahy Scale: Fair       Standing balance-Leahy Scale: Poor                               Pertinent Vitals/Pain Pain Assessment: No/denies pain  After walking, BP 155/80, HR 70, SaO2 98% on RA.     Home Living Family/patient expects to be discharged to:: Skilled nursing facility Living Arrangements: Alone                    Prior Function Level of Independence: Independent               Hand Dominance   Dominant Hand: Right    Extremity/Trunk Assessment   Upper Extremity Assessment: Overall WFL for tasks assessed  Lower Extremity Assessment: Overall WFL for tasks assessed      Cervical / Trunk Assessment: Kyphotic  Communication   Communication: HOH  Cognition Arousal/Alertness: Awake/alert Behavior During Therapy: WFL for tasks assessed/performed Overall Cognitive Status: Within Functional Limits for tasks assessed                      General Comments      Exercises        Assessment/Plan    PT Assessment All further PT needs can be met in the next venue of care  PT Diagnosis Difficulty walking   PT Problem List Decreased activity tolerance;Decreased balance  PT Treatment Interventions     PT Goals (Current goals can be found in the Care Plan section)      Frequency     Barriers  to discharge        Co-evaluation               End of Session Equipment Utilized During Treatment: Gait belt Activity Tolerance: Patient limited by fatigue Patient left: in chair;with call bell/phone within reach;with family/visitor present Nurse Communication: Mobility status    Functional Assessment Tool Used: clinical judgement Functional Limitation: Mobility: Walking and moving around Mobility: Walking and Moving Around Current Status ([id number]): At least 1 percent but less than 20 percent impaired, limited or restricted Mobility: Walking and Moving Around Goal Status [phone]): At least 1 percent but less than 20 percent impaired, limited or restricted Mobility: Walking and Moving Around Discharge Status [phone]): At least 1 percent but less than 20 percent impaired, limited or restricted    Time: 5894-8347 PT Time Calculation (min) (ACUTE ONLY): 20 min   Charges:   PT Evaluation $Initial PT Evaluation Tier I: 1 Procedure PT Treatments $Gait Training: 8-22 mins   PT G Codes:   Functional Assessment Tool Used: clinical judgement Functional Limitation: Mobility: Walking and moving around    [place], [employer] 01/27/2014, 12:12 PM [phone]

## 2014-01-27 NOTE — Progress Notes (Signed)
  Echocardiogram 2D Echocardiogram has been performed.  [name] FRANCES 01/27/2014, 11:04 AM

## 2014-01-27 NOTE — Progress Notes (Signed)
UR completed 

## 2014-01-27 NOTE — Progress Notes (Signed)
Pt having uncontrolled tremors in lower ext and mild tremors in upper ext.  Pt can communicate and appears to not be in distress.  Applied 2L of o2 via nasal cannula for comfort.  Pt sister Gay Nail at bedside.  Informed Dr.  [place] Pink who stated he would be up shortly to assess.  Irven [place], RN

## 2014-01-27 NOTE — Progress Notes (Signed)
Clinical Social Work  CSW reviewed chart which stated that PT recommending SNF placement. CSW spoke with [facility] who reports they spoke with sister and agreeable to accept patient back when medically stable. CSW faxed clinicals for insurance authorization. CSW met with patient at bedside. Patient reports he was hopeful to DC straight home but knows he is too weak to go home and needs ST SNF again. Patient understanding of CSW role and reports he will return to [facility] when stable. Signed FL2 in chart in case patient medically stable over weekend and handoff will be left for weekend CSW.  [place], [place] [phone]

## 2014-01-28 ENCOUNTER — Inpatient Hospital Stay (HOSPITAL_COMMUNITY): Payer: Medicare Other

## 2014-01-28 ENCOUNTER — Encounter (HOSPITAL_COMMUNITY): Payer: Self-pay | Admitting: Emergency Medicine

## 2014-01-28 ENCOUNTER — Inpatient Hospital Stay (HOSPITAL_COMMUNITY)
Admission: EM | Admit: 2014-01-28 | Discharge: 2014-02-04 | DRG: 091 | Disposition: A | Discharge status: Skilled Nursing Facility | Payer: Medicare Other | Attending: Internal Medicine | Admitting: Internal Medicine

## 2014-01-28 DIAGNOSIS — F09 Unspecified mental disorder due to known physiological condition: Secondary | ICD-10-CM | POA: Diagnosis present

## 2014-01-28 DIAGNOSIS — Z87442 Personal history of urinary calculi: Secondary | ICD-10-CM | POA: Diagnosis not present

## 2014-01-28 DIAGNOSIS — R451 Restlessness and agitation: Secondary | ICD-10-CM | POA: Diagnosis present

## 2014-01-28 DIAGNOSIS — G9340 Encephalopathy, unspecified: Secondary | ICD-10-CM | POA: Diagnosis present

## 2014-01-28 DIAGNOSIS — Z794 Long term (current) use of insulin: Secondary | ICD-10-CM

## 2014-01-28 DIAGNOSIS — I252 Old myocardial infarction: Secondary | ICD-10-CM | POA: Diagnosis not present

## 2014-01-28 DIAGNOSIS — G9341 Metabolic encephalopathy: Secondary | ICD-10-CM | POA: Diagnosis present

## 2014-01-28 DIAGNOSIS — F419 Anxiety disorder, unspecified: Secondary | ICD-10-CM | POA: Diagnosis present

## 2014-01-28 DIAGNOSIS — I10 Essential (primary) hypertension: Secondary | ICD-10-CM | POA: Diagnosis present

## 2014-01-28 DIAGNOSIS — Z7982 Long term (current) use of aspirin: Secondary | ICD-10-CM

## 2014-01-28 DIAGNOSIS — Z86010 Personal history of colonic polyps: Secondary | ICD-10-CM

## 2014-01-28 DIAGNOSIS — N200 Calculus of kidney: Secondary | ICD-10-CM

## 2014-01-28 DIAGNOSIS — E1129 Type 2 diabetes mellitus with other diabetic kidney complication: Secondary | ICD-10-CM | POA: Diagnosis present

## 2014-01-28 DIAGNOSIS — Z79899 Other long term (current) drug therapy: Secondary | ICD-10-CM | POA: Diagnosis not present

## 2014-01-28 DIAGNOSIS — E876 Hypokalemia: Secondary | ICD-10-CM | POA: Diagnosis present

## 2014-01-28 DIAGNOSIS — E785 Hyperlipidemia, unspecified: Secondary | ICD-10-CM | POA: Diagnosis present

## 2014-01-28 DIAGNOSIS — I5042 Chronic combined systolic (congestive) and diastolic (congestive) heart failure: Secondary | ICD-10-CM | POA: Diagnosis present

## 2014-01-28 DIAGNOSIS — E1121 Type 2 diabetes mellitus with diabetic nephropathy: Secondary | ICD-10-CM

## 2014-01-28 DIAGNOSIS — N390 Urinary tract infection, site not specified: Secondary | ICD-10-CM | POA: Diagnosis present

## 2014-01-28 DIAGNOSIS — I2510 Atherosclerotic heart disease of native coronary artery without angina pectoris: Secondary | ICD-10-CM | POA: Diagnosis present

## 2014-01-28 DIAGNOSIS — G253 Myoclonus: Secondary | ICD-10-CM | POA: Diagnosis not present

## 2014-01-28 DIAGNOSIS — N289 Disorder of kidney and ureter, unspecified: Secondary | ICD-10-CM | POA: Diagnosis present

## 2014-01-28 DIAGNOSIS — I152 Hypertension secondary to endocrine disorders: Secondary | ICD-10-CM | POA: Diagnosis present

## 2014-01-28 LAB — BASIC METABOLIC PANEL
Anion gap: 14 (ref 5–15)
BUN: 16 mg/dL (ref 6–23)
CHLORIDE: 104 meq/L (ref 96–112)
CO2: 24 meq/L (ref 19–32)
Calcium: 9.7 mg/dL (ref 8.4–10.5)
Creatinine, Ser: 1.16 mg/dL (ref 0.50–1.35)
GFR calc non Af Amer: 59 mL/min — ABNORMAL LOW (ref >90)
GFR, EST AFRICAN AMERICAN: 68 mL/min — AB (ref >90)
Glucose, Bld: 132 mg/dL — ABNORMAL HIGH (ref 70–99)
Potassium: 3.7 mEq/L (ref 3.7–5.3)
Sodium: 142 mEq/L (ref 137–147)

## 2014-01-28 LAB — CBC WITH DIFFERENTIAL/PLATELET
BASOS ABS: 0 10*3/uL (ref 0.0–0.1)
BASOS PCT: 0 % (ref 0–1)
EOS ABS: 0.3 10*3/uL (ref 0.0–0.7)
Eosinophils Relative: 4 % (ref 0–5)
HEMATOCRIT: 44.1 % (ref 39.0–52.0)
HEMOGLOBIN: 15.1 g/dL (ref 13.0–17.0)
Lymphocytes Relative: 25 % (ref 12–46)
Lymphs Abs: 1.8 10*3/uL (ref 0.7–4.0)
MCH: 32.7 pg (ref 26.0–34.0)
MCHC: 34.2 g/dL (ref 30.0–36.0)
MCV: 95.5 fL (ref 78.0–100.0)
MONOS PCT: 11 % (ref 3–12)
Monocytes Absolute: 0.8 10*3/uL (ref 0.1–1.0)
Neutro Abs: 4.2 10*3/uL (ref 1.7–7.7)
Neutrophils Relative %: 60 % (ref 43–77)
Platelets: 299 10*3/uL (ref 150–400)
RBC: 4.62 MIL/uL (ref 4.22–5.81)
RDW: 13.7 % (ref 11.5–15.5)
WBC: 7 10*3/uL (ref 4.0–10.5)

## 2014-01-28 LAB — CBC
HCT: 43.8 % (ref 39.0–52.0)
HEMOGLOBIN: 14.9 g/dL (ref 13.0–17.0)
MCH: 32.5 pg (ref 26.0–34.0)
MCHC: 34 g/dL (ref 30.0–36.0)
MCV: 95.4 fL (ref 78.0–100.0)
Platelets: 275 10*3/uL (ref 150–400)
RBC: 4.59 MIL/uL (ref 4.22–5.81)
RDW: 13.8 % (ref 11.5–15.5)
WBC: 6.5 10*3/uL (ref 4.0–10.5)

## 2014-01-28 LAB — CREATININE, SERUM
Creatinine, Ser: 1.11 mg/dL (ref 0.50–1.35)
GFR calc Af Amer: 72 mL/min — ABNORMAL LOW (ref >90)
GFR, EST NON AFRICAN AMERICAN: 62 mL/min — AB (ref >90)

## 2014-01-28 LAB — URINALYSIS, ROUTINE W REFLEX MICROSCOPIC
BILIRUBIN URINE: NEGATIVE
Glucose, UA: 100 mg/dL — AB
Ketones, ur: NEGATIVE mg/dL
Leukocytes, UA: NEGATIVE
Nitrite: NEGATIVE
PROTEIN: 30 mg/dL — AB
Specific Gravity, Urine: 1.022 (ref 1.005–1.030)
UROBILINOGEN UA: 0.2 mg/dL (ref 0.0–1.0)
pH: 6 (ref 5.0–8.0)

## 2014-01-28 LAB — URINE CULTURE: Colony Count: >=100000

## 2014-01-28 LAB — POTASSIUM: Potassium: 3.6 mEq/L — ABNORMAL LOW (ref 3.7–5.3)

## 2014-01-28 LAB — URINE MICROSCOPIC-ADD ON

## 2014-01-28 LAB — GLUCOSE, CAPILLARY: GLUCOSE-CAPILLARY: 133 mg/dL — AB (ref 70–99)

## 2014-01-28 LAB — TROPONIN I: Troponin I: <0.3 ng/mL (ref <0.30)

## 2014-01-28 MED ORDER — ASPIRIN EC 81 MG PO TBEC
81.0000 mg | DELAYED_RELEASE_TABLET | Freq: Every morning | ORAL | Status: DC
  Administered 2014-01-28 – 2014-02-04 (×7): 81 mg via ORAL
  Filled 2014-01-28 (×8): qty 1

## 2014-01-28 MED ORDER — TAMSULOSIN HCL 0.4 MG PO CAPS
0.4000 mg | ORAL_CAPSULE | Freq: Every day | ORAL | Status: DC
  Administered 2014-01-28 – 2014-02-03 (×7): 0.4 mg via ORAL
  Filled 2014-01-28 (×8): qty 1

## 2014-01-28 MED ORDER — INSULIN GLARGINE 100 UNIT/ML ~~LOC~~ SOLN
10.0000 [IU] | Freq: Every day | SUBCUTANEOUS | Status: DC
  Administered 2014-01-28 – 2014-02-03 (×6): 10 [IU] via SUBCUTANEOUS
  Filled 2014-01-28 (×10): qty 0.1

## 2014-01-28 MED ORDER — ONDANSETRON HCL 4 MG PO TABS: 4.0000 mg | ORAL_TABLET | Freq: Four times a day (QID) | ORAL | Status: DC | PRN

## 2014-01-28 MED ORDER — ACETAMINOPHEN 325 MG PO TABS: 650.0000 mg | ORAL_TABLET | Freq: Four times a day (QID) | ORAL | Status: DC | PRN

## 2014-01-28 MED ORDER — ONDANSETRON HCL 4 MG/2ML IJ SOLN: 4.0000 mg | Freq: Four times a day (QID) | INTRAMUSCULAR | Status: DC | PRN

## 2014-01-28 MED ORDER — AMLODIPINE BESYLATE 10 MG PO TABS
10.0000 mg | ORAL_TABLET | Freq: Every morning | ORAL | Status: DC
  Administered 2014-01-30 – 2014-02-04 (×6): 10 mg via ORAL
  Filled 2014-01-28 (×8): qty 1

## 2014-01-28 MED ORDER — DEXTROSE 5 % IV SOLN
1.0000 g | INTRAVENOUS | Status: DC
  Administered 2014-01-28 – 2014-02-03 (×7): 1 g via INTRAVENOUS
  Filled 2014-01-28 (×7): qty 10

## 2014-01-28 MED ORDER — VALPROATE SODIUM 500 MG/5ML IV SOLN
500.0000 mg | Freq: Two times a day (BID) | INTRAVENOUS | Status: DC
  Administered 2014-01-28 – 2014-02-02 (×10): 500 mg via INTRAVENOUS
  Filled 2014-01-28 (×11): qty 5

## 2014-01-28 MED ORDER — INSULIN ASPART 100 UNIT/ML ~~LOC~~ SOLN
0.0000 [IU] | SUBCUTANEOUS | Status: DC
  Administered 2014-01-28: 1 [IU] via SUBCUTANEOUS
  Administered 2014-01-29: 2 [IU] via SUBCUTANEOUS
  Administered 2014-01-29 – 2014-01-30 (×3): 1 [IU] via SUBCUTANEOUS
  Administered 2014-01-30: 2 [IU] via SUBCUTANEOUS
  Administered 2014-01-31 (×2): 1 [IU] via SUBCUTANEOUS
  Administered 2014-01-31: 3 [IU] via SUBCUTANEOUS
  Administered 2014-01-31: 1 [IU] via SUBCUTANEOUS
  Administered 2014-02-01 – 2014-02-03 (×8): 2 [IU] via SUBCUTANEOUS
  Administered 2014-02-03: 1 [IU] via SUBCUTANEOUS
  Administered 2014-02-03: 4 [IU] via SUBCUTANEOUS
  Administered 2014-02-04: 2 [IU] via SUBCUTANEOUS
  Administered 2014-02-04: 1 [IU] via SUBCUTANEOUS

## 2014-01-28 MED ORDER — SODIUM CHLORIDE 0.9 % IJ SOLN
3.0000 mL | Freq: Two times a day (BID) | INTRAMUSCULAR | Status: DC
  Administered 2014-01-28 – 2014-02-03 (×9): 3 mL via INTRAVENOUS

## 2014-01-28 MED ORDER — ENOXAPARIN SODIUM 60 MG/0.6ML ~~LOC~~ SOLN
50.0000 mg | Freq: Every day | SUBCUTANEOUS | Status: DC
  Administered 2014-01-28 – 2014-02-03 (×7): 50 mg via SUBCUTANEOUS
  Filled 2014-01-28 (×8): qty 0.6

## 2014-01-28 MED ORDER — CETYLPYRIDINIUM CHLORIDE 0.05 % MT LIQD
7.0000 mL | Freq: Two times a day (BID) | OROMUCOSAL | Status: DC
  Administered 2014-01-29 – 2014-01-30 (×5): 7 mL via OROMUCOSAL

## 2014-01-28 MED ORDER — SODIUM CHLORIDE 0.9 % IV SOLN
INTRAVENOUS | Status: DC
  Administered 2014-01-28: 23:00:00 via INTRAVENOUS

## 2014-01-28 MED ORDER — CLONAZEPAM 0.5 MG PO TABS
0.7500 mg | ORAL_TABLET | Freq: Three times a day (TID) | ORAL | Status: DC
  Administered 2014-01-28 – 2014-02-04 (×20): 0.75 mg via ORAL
  Filled 2014-01-28 (×22): qty 2

## 2014-01-28 MED ORDER — METOPROLOL TARTRATE 50 MG PO TABS
50.0000 mg | ORAL_TABLET | Freq: Two times a day (BID) | ORAL | Status: DC
  Administered 2014-01-28 – 2014-02-04 (×14): 50 mg via ORAL
  Filled 2014-01-28 (×3): qty 2
  Filled 2014-01-28: qty 1
  Filled 2014-01-28: qty 2
  Filled 2014-01-28 (×3): qty 1
  Filled 2014-01-28 (×2): qty 2
  Filled 2014-01-28: qty 1
  Filled 2014-01-28 (×2): qty 2
  Filled 2014-01-28 (×2): qty 1

## 2014-01-28 MED ORDER — ACETAMINOPHEN 650 MG RE SUPP: 650.0000 mg | Freq: Four times a day (QID) | RECTAL | Status: DC | PRN

## 2014-01-28 MED ORDER — HYDRALAZINE HCL 50 MG PO TABS
50.0000 mg | ORAL_TABLET | Freq: Three times a day (TID) | ORAL | Status: DC
  Administered 2014-01-28 – 2014-02-04 (×20): 50 mg via ORAL
  Filled 2014-01-28 (×22): qty 1

## 2014-01-28 MED ORDER — LORAZEPAM 2 MG/ML IJ SOLN
1.0000 mg | Freq: Once | INTRAMUSCULAR | Status: AC
  Administered 2014-01-28: 1 mg via INTRAVENOUS
  Filled 2014-01-28: qty 1

## 2014-01-28 MED ORDER — PRAVASTATIN SODIUM 40 MG PO TABS
40.0000 mg | ORAL_TABLET | Freq: Every day | ORAL | Status: DC
  Administered 2014-01-28 – 2014-02-03 (×7): 40 mg via ORAL
  Filled 2014-01-28 (×8): qty 1

## 2014-01-28 MED ORDER — DOCUSATE SODIUM 100 MG PO CAPS
100.0000 mg | ORAL_CAPSULE | Freq: Two times a day (BID) | ORAL | Status: DC
  Administered 2014-01-28 – 2014-02-04 (×13): 100 mg via ORAL
  Filled 2014-01-28 (×14): qty 1

## 2014-01-28 NOTE — ED Notes (Signed)
Bed: [facility] Expected date: 01/28/14 Expected time: 5:07 PM Means of arrival: Ambulance Comments: Tremors

## 2014-01-28 NOTE — Progress Notes (Signed)
CARE MANAGEMENT NOTE 01/28/2014  Patient:  [name], [name]   Account Number:  [id number]  Date Initiated:  01/28/2014  Documentation initiated by:  [facility]  Subjective/Objective Assessment:     Action/Plan:   Anticipated DC Date:  01/28/2014   Anticipated DC Plan:  SKILLED NURSING FACILITY  In-house referral  Clinical Social Worker      DC Planning Services  CM consult      Choice offered to / List presented to:             Status of service:  Completed, signed off Medicare Important Message given?  NA - LOS <3 / Initial given by admissions (If response is "NO", the following Medicare IM given date fields will be blank) Date Medicare IM given:   Medicare IM given by:   Date Additional Medicare IM given:   Additional Medicare IM given by:    Discharge Disposition:  [place]  Per UR Regulation:    If discussed at Long Length of Stay Meetings, dates discussed:    Comments:  01/28/2014 Chart reviewed.  No NCM needs identified. Jonnie Finner RN CCM Case Mgmt

## 2014-01-28 NOTE — ED Notes (Signed)
Pt from nursing home, per ems pt co of muscle tremors. Pt was seen and admitted to the hospital on Thursday for similar symptoms and UTI. Pt received 5 mg Versed IM. Pt was combative to ems personnel.

## 2014-01-28 NOTE — H&P (Signed)
PCP:  [name], MD  [facility]  Chief Complaint:  Agitation and tremor  HPI: [name] is a 79 y.o. male   has a past medical history of CAD (coronary artery disease); History of syncope; HTN (hypertension); Borderline hyperlipidemia; History of gastritis; Diverticulosis of colon; Colon polyps; Hypertrophy of prostate with urinary obstruction and other lower urinary tract symptoms (LUTS); History of pyelonephritis; DJD (degenerative joint disease); Anxiety; DM (diabetes mellitus); Myocardial infarction; and History of kidney stones.   Recent history significant for : In November was admitted by [facility] for persistent jerking-like tremors of arms and legs. His  Workup was negative for seizures and CVA, he was transferred to [facility] and was diagnosed with  startle myoclonus. Patient was started on twice a day Klonopin and discharged to a short-term skilled nursing facility. He was doing better he was getting regular physical therapy and started to be able to ambulate until 12/17 when his symptoms has returned. Patient was readmitted for further evaluation and found to have UTI. He was seen by neurology and his Klonopin dose was changed from 1 mg twice a day to 0.75 mg by mouth 3 times a day and adding Keppra 500 mg by mouth twice a day. His TSH and B-12 levels were normal.  MRI of cervical spine showed chronic lesions none of which needed further workup. Early in the morning today patient was discharged back to her nursing facility. In the morning he was doing well but later on decompensated patient started to have tremors which were difficult to control. He became agitated. Patient was brought back to [facility] emergency department. Initial workup unremarkable. Discussed his care with neurology who recommends discontinuation of Keppra as this sometimes can worsen agitation and instead starting patient on IV Depacon. Of note 1 month ago patient reportedly was fairly active  and clear minded. For the past 1 month he had severely deteriorated with episodes of confusion and occasional agitation although today seems to be worse from baseline.    Hospitalist was called for admission for agitation poorly controlled tremors  Review of Systems:    Pertinent positives include: confusion  Constitutional:  No weight loss, night sweats, Fevers, chills, fatigue, weight loss  HEENT:  No headaches, Difficulty swallowing,Tooth/dental problems,Sore throat,  No sneezing, itching, ear ache, nasal congestion, post nasal drip,  Cardio-vascular:  No chest pain, Orthopnea, PND, anasarca, dizziness, palpitations.no Bilateral lower extremity swelling  GI:  No heartburn, indigestion, abdominal pain, nausea, vomiting, diarrhea, change in bowel habits, loss of appetite, melena, blood in stool, hematemesis Resp:  no shortness of breath at rest. No dyspnea on exertion, No excess mucus, no productive cough, No non-productive cough, No coughing up of blood.No change in color of mucus.No wheezing. Skin:  no rash or lesions. No jaundice GU:  no dysuria, change in color of urine, no urgency or frequency. No straining to urinate.  No flank pain.  Musculoskeletal:  No joint pain or no joint swelling. No decreased range of motion. No back pain.  Psych:  No change in mood or affect. No depression or anxiety. No memory loss.  Neuro: no localizing neurological complaints, no tingling, no weakness, no double vision, no gait abnormality, no slurred speech, no   Otherwise ROS are negative except for above, 10 systems were reviewed  Past Medical History: Past Medical History  Diagnosis Date  . CAD (coronary artery disease)   . History of syncope   . HTN (hypertension)   . Borderline hyperlipidemia   .  History of gastritis   . Diverticulosis of colon   . Colon polyps   . Hypertrophy of prostate with urinary obstruction and other lower urinary tract symptoms (LUTS)   . History of  pyelonephritis   . DJD (degenerative joint disease)   . Anxiety   . DM (diabetes mellitus)     Adult onset  . 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 History of kidney stones    Past Surgical History  Procedure Laterality Date  . Appendectomy    . Cataract extraction      RIGHT EYE  . Knee surgery      bilateral ARTHROSCOPY X2 TO EACH KNEE  . Shoulder surgery  04/2005    left by Dr. Berenice Primas  . 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 S/p cysto & stents for kidnedy stone 11/08 by dr. Reece Agar    . Cystoscopy  04/17/11    stent placed  . Cystoscopy w/ ureteral stent placement  04/17/2011    Procedure: CYSTOSCOPY WITH RETROGRADE PYELOGRAM/URETERAL STENT PLACEMENT;  Surgeon: Hanley Ben, MD;  Location: [facility];  Service: Urology;  Laterality: Left;  . Cystoscopy with retrograde pyelogram, ureteroscopy and stent placement Left 04/18/2013    Procedure: CYSTOSCOPY WITH LEFT RETROGRADE PYELOGRAM, LEFT URETEROSCOPY AND LASER LITHOTRIPSY LEFT STENT PLACEMENT;  Surgeon: Dutch Gray, MD;  Location: [facility];  Service: Urology;  Laterality: Left;  . Holmium laser application Left 09/11/4479    Procedure: HOLMIUM LASER APPLICATION;  Surgeon: Dutch Gray, MD;  Location: [facility];  Service: Urology;  Laterality: Left;     Medications: Prior to Admission medications   Medication Sig Start Date End Date Taking? Authorizing Provider  acetaminophen (TYLENOL) 650 MG CR tablet Take 650 mg by mouth every 4 (four) hours as needed for pain or fever.   Yes Historical Provider, MD  amLODipine (NORVASC) 10 MG tablet Take 1 tablet (10 mg total) by mouth every morning. 11/10/13  Yes Tonia Ghent, MD  aspirin EC 81 MG tablet Take 81 mg by mouth every morning.    Yes Historical Provider, MD  clonazePAM (KLONOPIN) 0.5 MG tablet Take 1.5 tablets (0.75 mg total) by mouth 3 (three) times daily.  01/27/14  Yes Annita Brod, MD  glimepiride (AMARYL) 4 MG tablet Take 1 tablet (4 mg total) by mouth daily with breakfast. 01/13/14  Yes Tonia Ghent, MD  hydrALAZINE (APRESOLINE) 50 MG tablet Take 50 mg by mouth 3 (three) times daily.   Yes Historical Provider, MD  insulin aspart (NOVOLOG) 100 UNIT/ML injection Inject 4-10 Units into the skin 3 (three) times daily before meals. Patient taking differently: Inject 0-10 Units into the skin 3 (three) times daily before meals. SSI: <150 = 0 units, 151-200 = 4 units, 201-250 units, 251-300 = 8 units, >300 = 10 units 01/13/14  Yes Tonia Ghent, MD  Insulin Glargine ([name] SOLOSTAR) 300 UNIT/ML SOPN Inject 20 Units into the skin at bedtime.   Yes Historical Provider, MD  lisinopril-hydrochlorothiazide (PRINZIDE,ZESTORETIC) 20-25 MG per tablet Take 1 tablet by mouth every morning.    Yes Historical Provider, MD  metFORMIN (GLUCOPHAGE) 1000 MG tablet Take 1 tablet (1,000 mg total) by mouth 2 (two) times daily with a meal. 12/19/13  Yes Maryann Mikhail, DO  metoprolol (LOPRESSOR) 50 MG tablet Take 1 tablet (50 mg total)  by mouth 2 (two) times daily. 11/10/13  Yes Tonia Ghent, MD  pravastatin (PRAVACHOL) 40 MG tablet Take 1 tablet (40 mg total) by mouth at bedtime. Patient taking differently: Take 40 mg by mouth every morning.  11/10/13  Yes Tonia Ghent, MD  tamsulosin (FLOMAX) 0.4 MG CAPS capsule Take 0.4 mg by mouth daily after supper.   Yes Historical Provider, MD  levETIRAcetam (KEPPRA) 500 MG tablet Take 1 tablet (500 mg total) by mouth 2 (two) times daily. 01/27/14   Annita Brod, MD    Allergies:   Allergies  Allergen Reactions  . Codeine Phosphate Nausea Only    Can take with food and usually doesn't cause nausea  . Morphine Nausea Only    Can take with food and usually doesn't cause nausea    Social History:  Ambulatory ambulates with assistance  From facility [facility]   reports that he has never smoked. He has never used  smokeless tobacco. He reports that he does not drink alcohol or use illicit drugs.    Family History: family history includes Dementia in his brother; Heart attack in his mother. There is no history of Colon cancer or Prostate cancer.    Physical Exam: Patient Vitals for the past 24 hrs:  BP Temp Temp src Pulse Resp SpO2  01/28/14 2029 161/79 mmHg 98.7 F (37.1 C) Oral 85 18 95 %  01/28/14 1808 154/77 mmHg - - 77 19 100 %  01/28/14 1731 129/74 mmHg 98.3 F (36.8 C) Oral 90 17 93 %    1. General:  in No Acute distress 2. Psychological: Alert but not Oriented, combative 3. Head/ENT:    Dry Mucous Membranes                          Head Non traumatic, neck supple                          Normal   Dentition 4. SKIN:   decreased Skin turgor,  Skin clean Dry and intact no rash 5. Heart: Regular rate and rhythm no Murmur, Rub or gallop 6. Lungs: Clear to auscultation bilaterally, no wheezes or crackles   7. Abdomen: Soft, non-tender, Non distended 8. Lower extremities: no clubbing, cyanosis, or edema 9. Neurologically Grossly intact, moving all 4 extremities equally not able to cooperate with exam 10. MSK: Normal range of motion  body mass index is unknown because there is no weight on file.   Labs on Admission:   Results for orders placed or performed during the hospital encounter of 01/28/14 (from the past 24 hour(s))  Basic metabolic panel     Status: Abnormal   Collection Time: 01/28/14  7:38 PM  Result Value Ref Range   Sodium 142 137 - 147 mEq/L   Potassium 3.7 3.7 - 5.3 mEq/L   Chloride 104 96 - 112 mEq/L   CO2 24 19 - 32 mEq/L   Glucose, Bld 132 (H) 70 - 99 mg/dL   BUN 16 6 - 23 mg/dL   Creatinine, Ser 1.16 0.50 - 1.35 mg/dL   Calcium 9.7 8.4 - 10.5 mg/dL   GFR calc non Af Amer 59 (L) >90 mL/min   GFR calc Af Amer 68 (L) >90 mL/min   Anion gap 14 5 - 15  CBC with Differential     Status: None   Collection Time: 01/28/14  7:38 PM  Result Value Ref  Range   WBC 7.0  4.0 - 10.5 K/uL   RBC 4.62 4.22 - 5.81 MIL/uL   Hemoglobin 15.1 13.0 - 17.0 g/dL   HCT 44.1 39.0 - 52.0 %   MCV 95.5 78.0 - 100.0 fL   MCH 32.7 26.0 - 34.0 pg   MCHC 34.2 30.0 - 36.0 g/dL   RDW 13.7 11.5 - 15.5 %   Platelets 299 150 - 400 K/uL   Neutrophils Relative % 60 43 - 77 %   Neutro Abs 4.2 1.7 - 7.7 K/uL   Lymphocytes Relative 25 12 - 46 %   Lymphs Abs 1.8 0.7 - 4.0 K/uL   Monocytes Relative 11 3 - 12 %   Monocytes Absolute 0.8 0.1 - 1.0 K/uL   Eosinophils Relative 4 0 - 5 %   Eosinophils Absolute 0.3 0.0 - 0.7 K/uL   Basophils Relative 0 0 - 1 %   Basophils Absolute 0.0 0.0 - 0.1 K/uL    UA not obtained today  Lab Results  Component Value Date   HGBA1C 7.3* 01/26/2014    Estimated Creatinine Clearance: 65 mL/min (by C-G formula based on Cr of 1.16).  BNP (last 3 results)  Recent Labs  01/27/14 0527  PROBNP 241.8    Other results:  I have pearsonaly reviewed this: ECG REPORT  Rate: 77  Rhythm: Sinus rhythm ST&T Change: No ischemic changes   There were no vitals filed for this visit.   Cultures:    Component Value Date/Time   SDES URINE, CLEAN CATCH 01/26/2014 1323   SPECREQUEST NONE 01/26/2014 1323   CULT  01/26/2014 1323    CITROBACTER FREUNDII Performed at [facility] 01/28/2014 FINAL 01/26/2014 1323     Radiological Exams on Admission: Mr Cervical Spine Wo Contrast  01/27/2014   CLINICAL DATA:  79 year old male with gait instability. Initial encounter.  EXAM: MRI CERVICAL SPINE WITHOUT CONTRAST  TECHNIQUE: Multiplanar, multisequence MR imaging of the cervical spine was performed. No intravenous contrast was administered.  COMPARISON:  CTA head and neck 12/16/2013.  Brain MRI 12/15/2013.  FINDINGS: Relatively preserved cervical lordosis. No marrow edema or evidence of acute osseous abnormality.  Cervicomedullary junction is within normal limits. Spinal cord signal is within normal limits at all visualized levels.   Degenerative changes at both sternoclavicular joints, some joint fluid. Negative visualized paraspinal soft tissues. Right mastoid effusion partially visible and chronic.  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 Borderline to mild spinal stenosis. Moderate to severe left facet hypertrophy. No significant foraminal stenosis.  C3-C4: Circumferential disc bulge with broad-based central disc protrusion with annular fissure (series 7, image 19). Moderate to severe bilateral facet hypertrophy. Mild ligament flavum hypertrophy. Spinal stenosis with no spinal cord mass effect. Severe bilateral C4 foraminal stenosis. This level appears stable.  C4-C5: Severe hypertrophy and ankylosis of the right facet joint is stable. Severe right C5 foraminal stenosis is stable.  C5-C6: Bulky mostly anterior circumferential disc osteophyte complex. Moderate facet hypertrophy bilaterally. Ligament flavum hypertrophy. No significant spinal stenosis. Moderate bilateral C6 foraminal stenosis is stable.  C6-C7: Interbody ankylosis via bulky anterior endplate osteophytes better demonstrated on 12/16/2013. Mild facet hypertrophy. No stenosis.  C7-T1: Interbody ankylosis via bulky anterior endplate osteophytes better demonstrated on 12/16/2013. No stenosis.  There is some upper thoracic dorsal epidural lipomatosis. Borderline to mild thoracic spinal stenosis at T2-T3 in part due to disc bulge and ligament flavum hypertrophy.  IMPRESSION: 1. Mild spinal  stenosis at C2-C3 and C3-C4, Stable and multifactorial with some ossification of the posterior longitudinal ligament suspected (OPLL, see neck CTA 12/16/2013). 2. Multifactorial moderate or severe neural foraminal stenosis at the bilateral C4, right C5, and bilateral C6 nerve levels. 3. Posterior element ankylosis at C4-C5 with severe facet hypertrophy on the right. Bulky endplate osteophytes without ankylosis at C5-C6. Bulky  endplate osteophytes with ankylosis from C6 to the upper thoracic spine. 4. Mild multifactorial upper thoracic spinal stenosis at T2-T3.   Electronically Signed   By: Lars Pinks M.D.   On: 01/27/2014 20:05    Chart has been reviewed  Assessment/Plan  79 year old gentleman with recently diagnosed startle Myoclonus, hx of DM, HTN, diastolic CHF here with agitation and confusion with worsening tremor.   Present on Admission:  . Startle myoclonus - admit for further medication adjustment,  urology consult recommend stopping Keppra as this can worsen agitation and start Depacon  . Chronic combined systolic and diastolic congestive heart failure - currently appears to be somewhat fluid down give gentle IV fluids . Encephalopathy - with agitation, given suddenly worsening will admit for further evaluation, stopped Keppra, stop Cipro as this also can sometimes cause agitation and elderly, will cycle cardiac enzymes, obtain CT of the head given sudden change  . DM type 2 causing renal disease - decrease dose of baseline insulin. Hold by mouth medications while patient is confused and agitated  . Essential hypertension- continue home medications but hold hydrochlorothiazide  . Urinary tract infection - instead of  Cipro will treat with Rocephin , urine was senstive    Prophylaxis:   Lovenox, Protonix  CODE STATUS:  FULL CODE  As per prior admission  Other plan as per orders.  I have spent a total of 65 min on this admission extra time was taken to discuss case with neurology Dr. Aram Beecham is aware   [place] 01/28/2014, 9:15 PM  [facility]  Pager [phone]   after 2 AM please page floor coverage PA If 7AM-7PM, please contact the day team taking care of the patient  [email or web address]  Password [email or web address]

## 2014-01-28 NOTE — Clinical Social Work Note (Signed)
  CSW faxed discharge summary to [facility] and followed up with a phone call to [facility].  No call back from [place] so CSW called and spoke with nurse Inez Catalina at [facility] to discuss pt discharge back to facility  CSW prepared discharge packet and provided to RN  CSW called and let pt's daughter Caryl Asp know that pt would be returning today  CSW arranged for pt transport to facility  No further CSW needs  CSW signing off .Dede Query, LCSW [facility] Clinical Social Worker - Weekend Coverage cell #: [phone]

## 2014-01-28 NOTE — Progress Notes (Signed)
Please see full discharge summary dictated by Dr. Gevena Barre on 01/27/2014.  Patient will be discharged to nursing facility. He is to continue taking his medications as prescribed. Patient should follow recommendations by the nursing facility for physical as well as occupational therapy. No changes to patient's discharge summary or medications.  Of note, patient did have cervical MRI conducted on 01/27/2014. Results were discussed with Dr. Kristeen Miss, neurosurgery, who did not see any major complications at this time. Patient may follow-up with Dr. Ellene Route if he develops neck pain.  Patient discontinued taking his ciprofloxacin for his urinary tract infection. Urine culture showed greater than 100,000 gram-negative rods, sensitivities and susceptibilities pending, which may be followed by his primary care physician or nursing home physician.   Time spent: 20 minutes  Trentan Trippe D.O. [facility] Pager [phone]  If 7PM-7AM, please contact night-coverage [email or web address] Password [facility] 01/28/2014, 9:43 AM

## 2014-01-28 NOTE — ED Provider Notes (Signed)
CSN: [id number]     Arrival date & time 01/28/14  1729 History   First MD Initiated Contact with Patient 01/28/14 1814     Chief Complaint  Patient presents with  . Tremors     (Consider location/radiation/quality/duration/timing/severity/associated sxs/prior Treatment) HPI Comments: Patient presents with tremors. He is a 79 year old male patient was had a long recent history with tremors. He was initially admitted in November to [facility] and had an extensive workup which essentially ruled out stroke and seizures. He was then transferred to [facility] where he was diagnosed with startle myoclonus. He was discharged to a skilled nursing facility where he was doing well for a couple weeks. He came back to the emergency room on December 17 and had recurrence of the tremors. He was diagnosed with a urinary tract infection as well and was readmitted to the hospital. It was felt that he was having a recurrence of the seizures related to the urinary tract infection. He is currently on Cipro. His urine culture grew out Escherichia coli which is responsive to Cipro.  Neurology was consulted and they increased his Klonopin from 1 mg twice a day to 0.75 milligrams 3 times a day and start him on Keppra 500 mg twice daily. They also added an MRI of the cervical spine. He was asked he discharged back to the nursing home this morning. His sister who is with him states his family was not notified that he was discharged back to nursing home. They were very upset when they got to the nursing home and apparently over there he had an episode of the tremors which lasted about an hour. He seems to be more confused than he has been although apparently he has episodes of confusion intermittently per their report. He's also unable to ambulate since she's been discharged. They request that he be readmitted to the hospital.   Past Medical History  Diagnosis Date  . CAD (coronary artery disease)   . History of syncope    . HTN (hypertension)   . Borderline hyperlipidemia   . History of gastritis   . Diverticulosis of colon   . Colon polyps   . Hypertrophy of prostate with urinary obstruction and other lower urinary tract symptoms (LUTS)   . History of pyelonephritis   . DJD (degenerative joint disease)   . Anxiety   . DM (diabetes mellitus)     Adult onset  . 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 History of kidney stones    Past Surgical History  Procedure Laterality Date  . Appendectomy    . Cataract extraction      RIGHT EYE  . Knee surgery      bilateral ARTHROSCOPY X2 TO EACH KNEE  . Shoulder surgery  04/2005    left by Dr. Berenice Primas  . 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 S/p cysto & stents for kidnedy stone 11/08 by dr. Reece Agar    . Cystoscopy  04/17/11    stent placed  . Cystoscopy w/ ureteral stent placement  04/17/2011    Procedure: CYSTOSCOPY WITH RETROGRADE PYELOGRAM/URETERAL STENT PLACEMENT;  Surgeon: Hanley Ben, MD;  Location: [facility];  Service: Urology;  Laterality: Left;  . Cystoscopy with retrograde pyelogram, ureteroscopy and stent placement Left 04/18/2013    Procedure: CYSTOSCOPY WITH LEFT RETROGRADE PYELOGRAM, LEFT  URETEROSCOPY AND LASER LITHOTRIPSY LEFT STENT PLACEMENT;  Surgeon: Dutch Gray, MD;  Location: [facility];  Service: Urology;  Laterality: Left;  . Holmium laser application Left 02/15/1094    Procedure: HOLMIUM LASER APPLICATION;  Surgeon: Dutch Gray, MD;  Location: [facility];  Service: Urology;  Laterality: Left;   Family History  Problem Relation Age of Onset  . Heart attack Mother   . Dementia Brother   . Colon cancer Neg Hx   . Prostate cancer Neg Hx    History  Substance Use Topics  . Smoking status: Never Smoker   . Smokeless tobacco: Never Used  . Alcohol Use: No    Review of Systems  Unable to perform ROS: Mental status  change      Allergies  Codeine phosphate and Morphine  Home Medications   Prior to Admission medications   Medication Sig Start Date End Date Taking? Authorizing Provider  acetaminophen (TYLENOL) 650 MG CR tablet Take 650 mg by mouth every 4 (four) hours as needed for pain or fever.   Yes Historical Provider, MD  amLODipine (NORVASC) 10 MG tablet Take 1 tablet (10 mg total) by mouth every morning. 11/10/13  Yes Tonia Ghent, MD  aspirin EC 81 MG tablet Take 81 mg by mouth every morning.    Yes Historical Provider, MD  clonazePAM (KLONOPIN) 0.5 MG tablet Take 1.5 tablets (0.75 mg total) by mouth 3 (three) times daily. 01/27/14  Yes Annita Brod, MD  glimepiride (AMARYL) 4 MG tablet Take 1 tablet (4 mg total) by mouth daily with breakfast. 01/13/14  Yes Tonia Ghent, MD  hydrALAZINE (APRESOLINE) 50 MG tablet Take 50 mg by mouth 3 (three) times daily.   Yes Historical Provider, MD  insulin aspart (NOVOLOG) 100 UNIT/ML injection Inject 4-10 Units into the skin 3 (three) times daily before meals. Patient taking differently: Inject 0-10 Units into the skin 3 (three) times daily before meals. SSI: <150 = 0 units, 151-200 = 4 units, 201-250 units, 251-300 = 8 units, >300 = 10 units 01/13/14  Yes Tonia Ghent, MD  Insulin Glargine ([name] SOLOSTAR) 300 UNIT/ML SOPN Inject 20 Units into the skin at bedtime.   Yes Historical Provider, MD  lisinopril-hydrochlorothiazide (PRINZIDE,ZESTORETIC) 20-25 MG per tablet Take 1 tablet by mouth every morning.    Yes Historical Provider, MD  metFORMIN (GLUCOPHAGE) 1000 MG tablet Take 1 tablet (1,000 mg total) by mouth 2 (two) times daily with a meal. 12/19/13  Yes Maryann Mikhail, DO  metoprolol (LOPRESSOR) 50 MG tablet Take 1 tablet (50 mg total) by mouth 2 (two) times daily. 11/10/13  Yes Tonia Ghent, MD  pravastatin (PRAVACHOL) 40 MG tablet Take 1 tablet (40 mg total) by mouth at bedtime. Patient taking differently: Take 40 mg by mouth every  morning.  11/10/13  Yes Tonia Ghent, MD  tamsulosin (FLOMAX) 0.4 MG CAPS capsule Take 0.4 mg by mouth daily after supper.   Yes Historical Provider, MD  levETIRAcetam (KEPPRA) 500 MG tablet Take 1 tablet (500 mg total) by mouth 2 (two) times daily. 01/27/14   Annita Brod, MD   BP 178/84 mmHg  Pulse 75  Temp(Src) 98.7 F (37.1 C) (Oral)  Resp 18  Ht 6' (1.829 m)  Wt 223 lb 12.3 oz (101.5 kg)  BMI 30.34 kg/m2  SpO2 96% Physical Exam  Constitutional: He appears well-developed and well-nourished.  HENT:  Head: Normocephalic and atraumatic.  Eyes: Pupils are equal, round, and reactive to light.  Neck:  Normal range of motion. Neck supple.  Cardiovascular: Normal rate, regular rhythm and normal heart sounds.   Pulmonary/Chest: Effort normal and breath sounds normal. No respiratory distress. He has no wheezes. He has no rales. He exhibits no tenderness.  Abdominal: Soft. Bowel sounds are normal. There is no tenderness. There is no rebound and no guarding.  Musculoskeletal: Normal range of motion. He exhibits no edema.  Lymphadenopathy:    He has no cervical adenopathy.  Neurological: He is alert.  Confused.  Moves all extremities symmetrically. Finger to nose intact. He is following commands. He is only oriented to person.  Skin: Skin is warm and dry. No rash noted.  Psychiatric: He has a normal mood and affect.    ED Course  Procedures (including critical care time) Labs Review Labs Reviewed  BASIC METABOLIC PANEL - Abnormal; Notable for the following:    Glucose, Bld 132 (*)    GFR calc non Af Amer 59 (*)    GFR calc Af Amer 68 (*)    All other components within normal limits  URINALYSIS, ROUTINE W REFLEX MICROSCOPIC - Abnormal; Notable for the following:    Glucose, UA 100 (*)    Hgb urine dipstick MODERATE (*)    Protein, ur 30 (*)    All other components within normal limits  CREATININE, SERUM - Abnormal; Notable for the following:    GFR calc non Af Amer 62 (*)     GFR calc Af Amer 72 (*)    All other components within normal limits  CBC WITH DIFFERENTIAL  TROPONIN I  CBC  URINE MICROSCOPIC-ADD ON  HEMOGLOBIN A1C  TROPONIN I  TROPONIN I  MAGNESIUM  PHOSPHORUS  TSH  COMPREHENSIVE METABOLIC PANEL  CBC  TROPONIN I    Imaging Review Ct Head Wo Contrast  01/28/2014   CLINICAL DATA:  Acute onset of muscle tremors.  Initial encounter.  EXAM: CT HEAD WITHOUT CONTRAST  TECHNIQUE: Contiguous axial images were obtained from the base of the skull through the vertex without intravenous contrast.  COMPARISON:  CT of the head performed 01/26/2014, and MRI of the brain performed 12/15/2013  FINDINGS: There is no evidence of acute infarction, mass lesion, or intra- or extra-axial hemorrhage on CT.  Prominence of the ventricles and sulci reflects mild cortical volume loss. Mild cerebellar atrophy is noted.  The brainstem and fourth ventricle are within normal limits. The basal ganglia are unremarkable in appearance. The cerebral hemispheres demonstrate grossly normal gray-white differentiation. No mass effect or midline shift is seen.  There is no evidence of fracture; visualized osseous structures are unremarkable in appearance. The orbits are within normal limits. Trace fluid is noted within the right mastoid air cells. The paranasal sinuses and left mastoid air cells are well-aerated. No significant soft tissue abnormalities are seen.  IMPRESSION: 1. No acute intracranial pathology seen on CT. 2. Mild cortical volume loss noted. 3. Trace fluid again noted within the right mastoid air cells.   Electronically Signed   By: Garald Balding M.D.   On: 01/28/2014 21:40   Mr Cervical Spine Wo Contrast  01/27/2014   CLINICAL DATA:  79 year old male with gait instability. Initial encounter.  EXAM: MRI CERVICAL SPINE WITHOUT CONTRAST  TECHNIQUE: Multiplanar, multisequence MR imaging of the cervical spine was performed. No intravenous contrast was administered.  COMPARISON:   CTA head and neck 12/16/2013.  Brain MRI 12/15/2013.  FINDINGS: Relatively preserved cervical lordosis. No marrow edema or evidence of acute osseous abnormality.  Cervicomedullary junction  is within normal limits. Spinal cord signal is within normal limits at all visualized levels.  Degenerative changes at both sternoclavicular joints, some joint fluid. Negative visualized paraspinal soft tissues. Right mastoid effusion partially visible and chronic.  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 Borderline to mild spinal stenosis. Moderate to severe left facet hypertrophy. No significant foraminal stenosis.  C3-C4: Circumferential disc bulge with broad-based central disc protrusion with annular fissure (series 7, image 19). Moderate to severe bilateral facet hypertrophy. Mild ligament flavum hypertrophy. Spinal stenosis with no spinal cord mass effect. Severe bilateral C4 foraminal stenosis. This level appears stable.  C4-C5: Severe hypertrophy and ankylosis of the right facet joint is stable. Severe right C5 foraminal stenosis is stable.  C5-C6: Bulky mostly anterior circumferential disc osteophyte complex. Moderate facet hypertrophy bilaterally. Ligament flavum hypertrophy. No significant spinal stenosis. Moderate bilateral C6 foraminal stenosis is stable.  C6-C7: Interbody ankylosis via bulky anterior endplate osteophytes better demonstrated on 12/16/2013. Mild facet hypertrophy. No stenosis.  C7-T1: Interbody ankylosis via bulky anterior endplate osteophytes better demonstrated on 12/16/2013. No stenosis.  There is some upper thoracic dorsal epidural lipomatosis. Borderline to mild thoracic spinal stenosis at T2-T3 in part due to disc bulge and ligament flavum hypertrophy.  IMPRESSION: 1. Mild spinal stenosis at C2-C3 and C3-C4, Stable and multifactorial with some ossification of the posterior longitudinal ligament suspected (OPLL, see neck CTA  12/16/2013). 2. Multifactorial moderate or severe neural foraminal stenosis at the bilateral C4, right C5, and bilateral C6 nerve levels. 3. Posterior element ankylosis at C4-C5 with severe facet hypertrophy on the right. Bulky endplate osteophytes without ankylosis at C5-C6. Bulky endplate osteophytes with ankylosis from C6 to the upper thoracic spine. 4. Mild multifactorial upper thoracic spinal stenosis at T2-T3.   Electronically Signed   By: Lars Pinks M.D.   On: 01/27/2014 20:05     EKG Interpretation None      MDM   Final diagnoses:  Agitation    Pt admitted to [facility].    Malvin Johns, MD 01/28/14 [phone]

## 2014-01-29 LAB — GLUCOSE, CAPILLARY
GLUCOSE-CAPILLARY: 131 mg/dL — AB (ref 70–99)
GLUCOSE-CAPILLARY: 124 mg/dL — AB (ref 70–99)
GLUCOSE-CAPILLARY: 95 mg/dL (ref 70–99)
Glucose-Capillary: 96 mg/dL (ref 70–99)
Glucose-Capillary: 101 mg/dL — ABNORMAL HIGH (ref 70–99)
Glucose-Capillary: 156 mg/dL — ABNORMAL HIGH (ref 70–99)

## 2014-01-29 LAB — TROPONIN I
Troponin I: <0.3 ng/mL (ref <0.30)
Troponin I: <0.3 ng/mL (ref <0.30)
Troponin I: <0.3 ng/mL (ref <0.30)

## 2014-01-29 LAB — COMPREHENSIVE METABOLIC PANEL
ALBUMIN: 3.4 g/dL — AB (ref 3.5–5.2)
ALK PHOS: 56 U/L (ref 39–117)
ALT: 27 U/L (ref 0–53)
ANION GAP: 14 (ref 5–15)
AST: 17 U/L (ref 0–37)
BUN: 17 mg/dL (ref 6–23)
CHLORIDE: 106 meq/L (ref 96–112)
CO2: 23 mEq/L (ref 19–32)
CREATININE: 1.13 mg/dL (ref 0.50–1.35)
Calcium: 9.2 mg/dL (ref 8.4–10.5)
GFR calc Af Amer: 70 mL/min — ABNORMAL LOW (ref >90)
GFR calc non Af Amer: 61 mL/min — ABNORMAL LOW (ref >90)
Glucose, Bld: 122 mg/dL — ABNORMAL HIGH (ref 70–99)
POTASSIUM: 3.5 meq/L — AB (ref 3.7–5.3)
Sodium: 143 mEq/L (ref 137–147)
Total Bilirubin: 0.3 mg/dL (ref 0.3–1.2)
Total Protein: 6.1 g/dL (ref 6.0–8.3)

## 2014-01-29 LAB — CBC
HEMATOCRIT: 43.4 % (ref 39.0–52.0)
Hemoglobin: 14.4 g/dL (ref 13.0–17.0)
MCH: 32.6 pg (ref 26.0–34.0)
MCHC: 33.2 g/dL (ref 30.0–36.0)
MCV: 98.2 fL (ref 78.0–100.0)
Platelets: 226 10*3/uL (ref 150–400)
RBC: 4.42 MIL/uL (ref 4.22–5.81)
RDW: 13.8 % (ref 11.5–15.5)
WBC: 5.5 10*3/uL (ref 4.0–10.5)

## 2014-01-29 LAB — MAGNESIUM: MAGNESIUM: 1.9 mg/dL (ref 1.5–2.5)

## 2014-01-29 LAB — PHOSPHORUS: Phosphorus: 3.8 mg/dL (ref 2.3–4.6)

## 2014-01-29 LAB — HEMOGLOBIN A1C
Hgb A1c MFr Bld: 7 % — ABNORMAL HIGH (ref <5.7)
MEAN PLASMA GLUCOSE: 154 mg/dL — AB (ref <117)

## 2014-01-29 LAB — TSH: TSH: 2.53 u[IU]/mL (ref 0.350–4.500)

## 2014-01-29 MED ORDER — LORAZEPAM 2 MG/ML IJ SOLN
1.0000 mg | Freq: Once | INTRAMUSCULAR | Status: AC
  Administered 2014-01-29: 1 mg via INTRAVENOUS

## 2014-01-29 MED ORDER — POTASSIUM CHLORIDE CRYS ER 20 MEQ PO TBCR
40.0000 meq | EXTENDED_RELEASE_TABLET | Freq: Once | ORAL | Status: AC
  Administered 2014-01-29: 40 meq via ORAL
  Filled 2014-01-29: qty 2

## 2014-01-29 MED ORDER — LORAZEPAM 2 MG/ML IJ SOLN
INTRAMUSCULAR | Status: AC
  Filled 2014-01-29: qty 1

## 2014-01-29 NOTE — Progress Notes (Signed)
At 1120 pt stated "I feel funny" , VSS  then started jerking all over more rt arm, leg and left leg than rt. Arm.  Eye fluttered  Mumbled when name called .  Dr. Doyle Askew and Dr. Doy Mince notified 1 mg of ativan x 1 given.  "jerking'' stopped .  Episode lasted 20 min..  Now arouses to light shoulder shake.

## 2014-01-29 NOTE — Progress Notes (Signed)
Another episode of shaking tremors after standing with assistance to void.  Episode lasted only a few seconds.  After episode slept.

## 2014-01-29 NOTE — Consult Note (Addendum)
NEURO HOSPITALIST CONSULT NOTE    Reason for Consult: agitated behavior, myoclonus  HPI:                                                                                                                                          [name] is an 79 y.o. 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 At the time of his last admission to [facility] he was started on keppra in addition to prior treatment with clonazepam. [name] said that he doesn't recall getting restless or agitated in the nursing home before coming in, but vividly recalls " the jerkiness coming back after being controlled lately". Denies HA, vertigo, double vision, focal weakness or numbness,slurred speech, language or vision impairment.  He ambulates with a walker and as per chart review " for the past month he had severely deteriorated with episodes of confusion and occasional agitation although today seems to be worse from baseline ". CT head showed no acute intracranial abnormality. Afebrile, serologies and UA unimpressive. Except for being recently started on keppra, no other new medications since discharge from the hospital.     Past Medical History  Diagnosis Date  . CAD (coronary artery disease)   . History of syncope   . HTN (hypertension)   . Borderline hyperlipidemia   . History of gastritis   . Diverticulosis of colon   . Colon polyps   . Hypertrophy of prostate with urinary obstruction and other lower urinary tract symptoms (LUTS)   . History of pyelonephritis   . DJD (degenerative joint disease)   . Anxiety   . DM (diabetes mellitus)     Adult onset  . Myocardial infarction     MORE THAN 10 YRS  AGO - TX'D MEDICALLY - NO STENTS     DR. WALL CARDIOLOGIST  . History of kidney stones     Past Surgical History  Procedure Laterality Date  . Appendectomy    . Cataract extraction      RIGHT EYE  . Knee surgery      bilateral ARTHROSCOPY X2 TO EACH KNEE  . Shoulder surgery  04/2005    left by Dr. Berenice Primas  . 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 S/p cysto & stents for kidnedy stone 11/08 by dr. Reece Agar    . Cystoscopy  04/17/11    stent placed  . Cystoscopy w/ ureteral stent placement  04/17/2011    Procedure: CYSTOSCOPY WITH RETROGRADE PYELOGRAM/URETERAL STENT  PLACEMENT;  Surgeon: Hanley Ben, MD;  Location: [facility];  Service: Urology;  Laterality: Left;  . Cystoscopy with retrograde pyelogram, ureteroscopy and stent placement Left 04/18/2013    Procedure: CYSTOSCOPY WITH LEFT RETROGRADE PYELOGRAM, LEFT URETEROSCOPY AND LASER LITHOTRIPSY LEFT STENT PLACEMENT;  Surgeon: Dutch Gray, MD;  Location: [facility];  Service: Urology;  Laterality: Left;  . Holmium laser application Left 05/12/5971    Procedure: HOLMIUM LASER APPLICATION;  Surgeon: Dutch Gray, MD;  Location: [facility];  Service: Urology;  Laterality: Left;    Family History  Problem Relation Age of Onset  . Heart attack Mother   . Dementia Brother   . Colon cancer Neg Hx   . Prostate cancer Neg Hx     Family History: as above   Social History:  reports that he has never smoked. He has never used smokeless tobacco. He reports that he does not drink alcohol or use illicit drugs.  Allergies  Allergen Reactions  . Codeine Phosphate Nausea Only    Can take with food and usually doesn't cause nausea  . Morphine Nausea Only    Can take with food and usually doesn't cause nausea    MEDICATIONS:                                                                                                                     Scheduled: . amLODipine  10 mg Oral q morning - 10a  .  antiseptic oral rinse  7 mL Mouth Rinse BID  . aspirin EC  81 mg Oral q morning - 10a  . cefTRIAXone (ROCEPHIN) IVPB 1 gram/50 mL D5W  1 g Intravenous Q24H  . clonazePAM  0.75 mg Oral TID  . docusate sodium  100 mg Oral BID  . enoxaparin (LOVENOX) injection  50 mg Subcutaneous QHS  . hydrALAZINE  50 mg Oral TID  . insulin aspart  0-9 Units Subcutaneous 6 times per day  . insulin glargine  10 Units Subcutaneous QHS  . metoprolol  50 mg Oral BID  . pravastatin  40 mg Oral QHS  . sodium chloride  3 mL Intravenous Q12H  . tamsulosin  0.4 mg Oral QPC supper  . valproate sodium  500 mg Intravenous Q12H     ROS:  History obtained from the patient and chart review  General ROS: negative for - chills, fatigue, fever, night sweats, weight gain or weight loss Psychological ROS: negative for - hallucinations or suicidal ideation Ophthalmic ROS: negative for - blurry vision, double vision, eye pain or loss of vision ENT ROS: negative for - epistaxis, nasal discharge, oral lesions, sore throat, tinnitus or vertigo Allergy and Immunology ROS: negative for - hives or itchy/watery eyes Hematological and Lymphatic ROS: negative for - bleeding problems, bruising or swollen lymph nodes Endocrine ROS: negative for - galactorrhea, hair pattern changes, polydipsia/polyuria or temperature intolerance Respiratory ROS: negative for - cough, hemoptysis, shortness of breath or wheezing Cardiovascular ROS: negative for - chest pain, dyspnea on exertion, edema or irregular heartbeat Gastrointestinal ROS: negative for - abdominal pain, diarrhea, hematemesis, nausea/vomiting or stool incontinence Genito-Urinary ROS: negative for - dysuria, hematuria, incontinence or urinary frequency/urgency Musculoskeletal ROS: negative for - joint swelling or muscular weakness Neurological ROS: as  noted in HPI Dermatological ROS: negative for rash and skin lesion changes  Physical exam: pleasant male in no apparent distress. Blood pressure 111/52, pulse 65, temperature 97.5 F (36.4 C), temperature source Oral, resp. rate 17, height 6' (1.829 m), weight 101.5 kg (223 lb 12.3 oz), SpO2 94 %. Head: normocephalic. Neck: supple, no bruits, no JVD. Cardiac: no murmurs. Lungs: clear. Abdomen: soft, no tender, no mass. Extremities: no edema. Neurologic Examination:                                                                                                      Neuro Exam: Mental Status: Alert, oriented, thought content appropriate. Speech fluent without evidence of aphasia. Able to follow 3 step commands without difficulty. Cranial Nerves: II: Discs flat bilaterally; Visual fields grossly normal, pupils equal, round, reactive to light and accommodation III,IV, VI: ptosis not present, extra-ocular motions intact bilaterally V,VII: smile symmetric, facial light touch sensation normal bilaterally VIII: hearing normal bilaterally IX,X: gag reflex present XI: bilateral shoulder shrug XII: midline tongue extension without atrophy or fasciculations  Motor: Right :Upper extremity 5/5Left: Upper extremity 5/5 Lower extremity 5/5Lower extremity 5/5 Tone and bulk:normal tone throughout; no atrophy noted Sensory: Pinprick and light touch intact, no vibratory sensation in the ankles and decreased proprioception in toes.  Deep Tendon Reflexes:  Right: Upper Extremity Left: Upper extremity   biceps (C-5 to C-6) 2/4 biceps (C-5 to C-6) 2/4 tricep (C7) 2/4triceps (C7) 2/4 Brachioradialis (C6) 2/4Brachioradialis (C6) 2/4  Lower Extremity Lower Extremity  quadriceps (L-2 to  L-4) 1/4 quadriceps (L-2 to L-4) 1/4 Achilles (S1) 0/4Achilles (S1) 0/4  Plantars: Right: downgoingLeft: downgoing Cerebellar: normal finger-to-nose, normal heel-to-shin test Gait: not tested due to safety reasons.   Lab Results  Component Value Date/Time   CHOL 126 11/10/2013 09:33 AM    Results for orders placed or performed during the hospital encounter of 01/28/14 (from the past 48 hour(s))  Basic metabolic panel     Status: Abnormal   Collection Time: 01/28/14  7:38 PM  Result Value Ref Range   Sodium 142 137 - 147 mEq/L  Potassium 3.7 3.7 - 5.3 mEq/L   Chloride 104 96 - 112 mEq/L   CO2 24 19 - 32 mEq/L   Glucose, Bld 132 (H) 70 - 99 mg/dL   BUN 16 6 - 23 mg/dL   Creatinine, Ser 1.16 0.50 - 1.35 mg/dL   Calcium 9.7 8.4 - 10.5 mg/dL   GFR calc non Af Amer 59 (L) >90 mL/min   GFR calc Af Amer 68 (L) >90 mL/min    Comment: (NOTE) The eGFR has been calculated using the CKD EPI equation. This calculation has not been validated in all clinical situations. eGFR's persistently <90 mL/min signify possible Chronic Kidney Disease.    Anion gap 14 5 - 15  CBC with Differential     Status: None   Collection Time: 01/28/14  7:38 PM  Result Value Ref Range   WBC 7.0 4.0 - 10.5 K/uL   RBC 4.62 4.22 - 5.81 MIL/uL   Hemoglobin 15.1 13.0 - 17.0 g/dL   HCT 44.1 39.0 - 52.0 %   MCV 95.5 78.0 - 100.0 fL   MCH 32.7 26.0 - 34.0 pg   MCHC 34.2 30.0 - 36.0 g/dL   RDW 13.7 11.5 - 15.5 %   Platelets 299 150 - 400 K/uL   Neutrophils Relative % 60 43 - 77 %   Neutro Abs 4.2 1.7 - 7.7 K/uL   Lymphocytes Relative 25 12 - 46 %   Lymphs Abs 1.8 0.7 - 4.0 K/uL   Monocytes Relative 11 3 - 12 %   Monocytes Absolute 0.8 0.1 - 1.0 K/uL   Eosinophils Relative 4 0 - 5 %   Eosinophils Absolute 0.3 0.0 - 0.7 K/uL   Basophils Relative 0 0 - 1 %   Basophils Absolute 0.0 0.0 - 0.1 K/uL  Urinalysis, Routine w reflex  microscopic     Status: Abnormal   Collection Time: 01/28/14  9:04 PM  Result Value Ref Range   Color, Urine YELLOW YELLOW   APPearance CLEAR CLEAR   Specific Gravity, Urine 1.022 1.005 - 1.030   pH 6.0 5.0 - 8.0   Glucose, UA 100 (A) NEGATIVE mg/dL   Hgb urine dipstick MODERATE (A) NEGATIVE   Bilirubin Urine NEGATIVE NEGATIVE   Ketones, ur NEGATIVE NEGATIVE mg/dL   Protein, ur 30 (A) NEGATIVE mg/dL   Urobilinogen, UA 0.2 0.0 - 1.0 mg/dL   Nitrite NEGATIVE NEGATIVE   Leukocytes, UA NEGATIVE NEGATIVE  Urine microscopic-add on     Status: None   Collection Time: 01/28/14  9:04 PM  Result Value Ref Range   RBC / HPF 21-50 <3 RBC/hpf   Bacteria, UA RARE RARE   Urine-Other MUCOUS PRESENT   CBC     Status: None   Collection Time: 01/28/14 10:31 PM  Result Value Ref Range   WBC 6.5 4.0 - 10.5 K/uL   RBC 4.59 4.22 - 5.81 MIL/uL   Hemoglobin 14.9 13.0 - 17.0 g/dL   HCT 43.8 39.0 - 52.0 %   MCV 95.4 78.0 - 100.0 fL   MCH 32.5 26.0 - 34.0 pg   MCHC 34.0 30.0 - 36.0 g/dL   RDW 13.8 11.5 - 15.5 %   Platelets 275 150 - 400 K/uL  Creatinine, serum     Status: Abnormal   Collection Time: 01/28/14 10:31 PM  Result Value Ref Range   Creatinine, Ser 1.11 0.50 - 1.35 mg/dL   GFR calc non Af Amer 62 (L) >90 mL/min   GFR calc Af Amer 72 (  L) >90 mL/min    Comment: (NOTE) The eGFR has been calculated using the CKD EPI equation. This calculation has not been validated in all clinical situations. eGFR's persistently <90 mL/min signify possible Chronic Kidney Disease.   Troponin I (q 6hr x 3)     Status: None   Collection Time: 01/28/14 10:34 PM  Result Value Ref Range   Troponin I <0.30 <0.30 ng/mL    Comment:        Due to the release kinetics of cTnI, a negative result within the first hours of the onset of symptoms does not rule out myocardial infarction with certainty. If myocardial infarction is still suspected, repeat the test at appropriate intervals.   Glucose, capillary      Status: Abnormal   Collection Time: 01/28/14 10:46 PM  Result Value Ref Range   Glucose-Capillary 131 (H) 70 - 99 mg/dL  Troponin I (q 6hr x 3)     Status: None   Collection Time: 01/29/14  3:39 AM  Result Value Ref Range   Troponin I <0.30 <0.30 ng/mL    Comment:        Due to the release kinetics of cTnI, a negative result within the first hours of the onset of symptoms does not rule out myocardial infarction with certainty. If myocardial infarction is still suspected, repeat the test at appropriate intervals.   Magnesium     Status: None   Collection Time: 01/29/14  3:40 AM  Result Value Ref Range   Magnesium 1.9 1.5 - 2.5 mg/dL  Phosphorus     Status: None   Collection Time: 01/29/14  3:40 AM  Result Value Ref Range   Phosphorus 3.8 2.3 - 4.6 mg/dL  Comprehensive metabolic panel     Status: Abnormal   Collection Time: 01/29/14  3:40 AM  Result Value Ref Range   Sodium 143 137 - 147 mEq/L   Potassium 3.5 (L) 3.7 - 5.3 mEq/L   Chloride 106 96 - 112 mEq/L   CO2 23 19 - 32 mEq/L   Glucose, Bld 122 (H) 70 - 99 mg/dL   BUN 17 6 - 23 mg/dL   Creatinine, Ser 1.13 0.50 - 1.35 mg/dL   Calcium 9.2 8.4 - 10.5 mg/dL   Total Protein 6.1 6.0 - 8.3 g/dL   Albumin 3.4 (L) 3.5 - 5.2 g/dL   AST 17 0 - 37 U/L   ALT 27 0 - 53 U/L   Alkaline Phosphatase 56 39 - 117 U/L   Total Bilirubin 0.3 0.3 - 1.2 mg/dL   GFR calc non Af Amer 61 (L) >90 mL/min   GFR calc Af Amer 70 (L) >90 mL/min    Comment: (NOTE) The eGFR has been calculated using the CKD EPI equation. This calculation has not been validated in all clinical situations. eGFR's persistently <90 mL/min signify possible Chronic Kidney Disease.    Anion gap 14 5 - 15  CBC     Status: None   Collection Time: 01/29/14  3:40 AM  Result Value Ref Range   WBC 5.5 4.0 - 10.5 K/uL   RBC 4.42 4.22 - 5.81 MIL/uL   Hemoglobin 14.4 13.0 - 17.0 g/dL   HCT 43.4 39.0 - 52.0 %   MCV 98.2 78.0 - 100.0 fL   MCH 32.6 26.0 - 34.0 pg   MCHC 33.2  30.0 - 36.0 g/dL   RDW 13.8 11.5 - 15.5 %   Platelets 226 150 - 400 K/uL  Glucose, capillary  Status: Abnormal   Collection Time: 01/29/14  4:58 AM  Result Value Ref Range   Glucose-Capillary 124 (H) 70 - 99 mg/dL    Ct Head Wo Contrast  01/28/2014   CLINICAL DATA:  Acute onset of muscle tremors.  Initial encounter.  EXAM: CT HEAD WITHOUT CONTRAST  TECHNIQUE: Contiguous axial images were obtained from the base of the skull through the vertex without intravenous contrast.  COMPARISON:  CT of the head performed 01/26/2014, and MRI of the brain performed 12/15/2013  FINDINGS: There is no evidence of acute infarction, mass lesion, or intra- or extra-axial hemorrhage on CT.  Prominence of the ventricles and sulci reflects mild cortical volume loss. Mild cerebellar atrophy is noted.  The brainstem and fourth ventricle are within normal limits. The basal ganglia are unremarkable in appearance. The cerebral hemispheres demonstrate grossly normal gray-white differentiation. No mass effect or midline shift is seen.  There is no evidence of fracture; visualized osseous structures are unremarkable in appearance. The orbits are within normal limits. Trace fluid is noted within the right mastoid air cells. The paranasal sinuses and left mastoid air cells are well-aerated. No significant soft tissue abnormalities are seen.  IMPRESSION: 1. No acute intracranial pathology seen on CT. 2. Mild cortical volume loss noted. 3. Trace fluid again noted within the right mastoid air cells.   Electronically Signed   By: Garald Balding M.D.   On: 01/28/2014 21:40   Mr Cervical Spine Wo Contrast  01/27/2014   CLINICAL DATA:  79 year old male with gait instability. Initial encounter.  EXAM: MRI CERVICAL SPINE WITHOUT CONTRAST  TECHNIQUE: Multiplanar, multisequence MR imaging of the cervical spine was performed. No intravenous contrast was administered.  COMPARISON:  CTA head and neck 12/16/2013.  Brain MRI 12/15/2013.   FINDINGS: Relatively preserved cervical lordosis. No marrow edema or evidence of acute osseous abnormality.  Cervicomedullary junction is within normal limits. Spinal cord signal is within normal limits at all visualized levels.  Degenerative changes at both sternoclavicular joints, some joint fluid. Negative visualized paraspinal soft tissues. Right mastoid effusion partially visible and chronic.  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 Borderline to mild spinal stenosis. Moderate to severe left facet hypertrophy. No significant foraminal stenosis.  C3-C4: Circumferential disc bulge with broad-based central disc protrusion with annular fissure (series 7, image 19). Moderate to severe bilateral facet hypertrophy. Mild ligament flavum hypertrophy. Spinal stenosis with no spinal cord mass effect. Severe bilateral C4 foraminal stenosis. This level appears stable.  C4-C5: Severe hypertrophy and ankylosis of the right facet joint is stable. Severe right C5 foraminal stenosis is stable.  C5-C6: Bulky mostly anterior circumferential disc osteophyte complex. Moderate facet hypertrophy bilaterally. Ligament flavum hypertrophy. No significant spinal stenosis. Moderate bilateral C6 foraminal stenosis is stable.  C6-C7: Interbody ankylosis via bulky anterior endplate osteophytes better demonstrated on 12/16/2013. Mild facet hypertrophy. No stenosis.  C7-T1: Interbody ankylosis via bulky anterior endplate osteophytes better demonstrated on 12/16/2013. No stenosis.  There is some upper thoracic dorsal epidural lipomatosis. Borderline to mild thoracic spinal stenosis at T2-T3 in part due to disc bulge and ligament flavum hypertrophy.  IMPRESSION: 1. 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 2. Multifactorial moderate or severe neural  foraminal stenosis at the bilateral C4, right C5, and bilateral C6 nerve levels. 3. Posterior element ankylosis at C4-C5 with severe facet hypertrophy on the right. Bulky endplate  osteophytes without ankylosis at C5-C6. Bulky endplate osteophytes with ankylosis from C6 to the upper thoracic spine. 4. Mild multifactorial upper thoracic spinal stenosis at T2-T3.   Electronically Signed   By: Lars Pinks M.D.   On: 01/27/2014 20:05   Assessment/Plan: 79 y/o admitted to the hospital due to agitated behavior and recurrence of uncontrollable myoclonus. 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 Discontinue keppra, which may be a potential cause for causing or exacerbating behavioral changes. Continue Depakote 500 mg BID and clonazepam. 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 He would ultimately need follow up by a movement disorder specialist. Will follow up.    Dorian Pod, MD 01/29/2014, 6:32 AM  [facility]

## 2014-01-29 NOTE — Progress Notes (Signed)
Patient ID: [name], male   DOB: 1934/09/07, 79 y.o.   MRN: [id number]  [facility] PROGRESS NOTE  [name] WNU:[id number] DOB: [birth year] DOA: 01/28/2014 PCP: [name], MD  Brief narrative: Pt is 78 y.o. Male with HTN, HLD, DM, CAD, MI, recently diagnosed with myoclonus at [facility], discharged from [facility] 01/27/2014 and readmitted due to recurrence of uncontrolled myoclonic activity.   Assessment and Plan:    Active Problems:   Myoclonus - recently started on Keppra and Clonazepam but Keppra has been discontinued per neurology team - pt started on Depakote 500 mg BID and Clonazepam to be continued  - PT evaluation once pt able to participate    Hypokalemia - mild, will supplement and repeat BMP in AM   Essential hypertension - reasonable inpatient control  - continue Norvasc, Metoprolol, Hydralazine    Urinary tract infection - urine culture 12/17 with citrobacter sp. - continue Rocephin    Chronic combined systolic and diastolic congestive heart failure - 2 D ECHO 01/27/2014 with EF 45 - 50% and grade I diastolic dysfunction  - clinically stable and euvolemic this AM - monitor daily weights, weight this AM is 223 lbs   DM type 2 causing renal disease - reasonable inpatient control - continue Lantus and SSI    Encephalopathy, metabolic - secondary to UTI - now at baseline mental state   DVT prophylaxis  Lovenox SQ while pt is in hospital  Code Status: Full Family Communication: Pt at bedside Disposition Plan: Transfer to medical floor   IV Access:   Peripheral IV Procedures and diagnostic studies:   Ct Head Wo Contrast  01/28/2014    No acute intracranial pathology seen on CT. 2. Mild cortical volume loss noted. 3. Trace fluid again noted within the right mastoid air cells.   Mr Cervical Spine Wo Contrast   01/27/2014   1. Mild spinal stenosis at C2-C3 and C3-C4, Stable and multifactorial with some ossification of the posterior  longitudinal ligament suspected (OPLL, see neck CTA 12/16/2013). 2. Multifactorial moderate or severe neural foraminal stenosis at the bilateral C4, right C5, and bilateral C6 nerve levels. 3. Posterior element ankylosis at C4-C5 with severe facet hypertrophy on the right. Bulky endplate osteophytes without ankylosis at C5-C6. Bulky endplate osteophytes with ankylosis from C6 to the upper thoracic spine. 4. Mild multifactorial upper thoracic spinal stenosis at T2-T3.   Medical Consultants:   Neurology  Other Consultants:   Physical therapy  Anti-Infectives:   Rocephin 12/19 -->   [name], MD  [facility] Pager [phone]  If 7PM-7AM, please contact night-coverage [email or web address] Password [email or web address] 01/29/2014, 9:29 AM   LOS: 1 day   HPI/Subjective: No events overnight.   Objective: Filed Vitals:   01/29/14 0200 01/29/14 0400 01/29/14 0600 01/29/14 0800  BP: 129/59 144/61 111/52   Pulse: 59 66 65   Temp:  97.5 F (36.4 C)  98.1 F (36.7 C)  TempSrc:  Oral  Oral  Resp: 17 22 17    Height:      Weight:  101.5 kg (223 lb 12.3 oz)    SpO2: 95% 94% 94%     Intake/Output Summary (Last 24 hours) at 01/29/14 9563 Last data filed at 01/29/14 0700  Gross per 24 hour  Intake    500 ml  Output    200 ml  Net    300 ml    Exam:   General:  Pt is alert, follows commands appropriately, not in acute distress  Cardiovascular:  Regular rate and rhythm, S1/S2,  no rubs, no gallops  Respiratory: Clear to auscultation bilaterally, no wheezing, no crackles, no rhonchi  Abdomen: Soft, non tender, non distended, bowel sounds present, no guarding  Extremities: No edema, pulses DP and PT palpable  Data Reviewed: Basic Metabolic Panel:  Recent Labs Lab 01/26/14 1145 01/27/14 0512 01/28/14 0731 01/28/14 1938 01/28/14 2231 01/29/14 0340  NA 137 143  --  142  --  143  K 3.5* 3.2* 3.6* 3.7  --  3.5*  CL 99 104  --  104  --  106  CO2 24 26  --  24  --  23  GLUCOSE 141* 108*  --  132*   --  122*  BUN 19 16  --  16  --  17  CREATININE 1.06 1.08  --  1.16 1.11 1.13  CALCIUM 9.7 9.1  --  9.7  --  9.2  MG  --   --   --   --   --  1.9  PHOS  --   --   --   --   --  3.8   Liver Function Tests:  Recent Labs Lab 01/26/14 1145 01/27/14 0512 01/29/14 0340  AST 19 16 17   ALT 30 27 27   ALKPHOS 62 56 56  BILITOT 0.4 0.3 0.3  PROT 6.7 6.1 6.1  ALBUMIN 3.7 3.4* 3.4*   CBC:  Recent Labs Lab 01/26/14 1145 01/27/14 0512 01/28/14 1938 01/28/14 2231 01/29/14 0340  WBC 5.8 5.1 7.0 6.5 5.5  NEUTROABS 3.0  --  4.2  --   --   HGB 14.8 14.3 15.1 14.9 14.4  HCT 44.2 42.0 44.1 43.8 43.4  MCV 95.9 95.5 95.5 95.4 98.2  PLT 317 252 299 275 226   Cardiac Enzymes:  Recent Labs Lab 01/26/14 1654 01/26/14 2258 01/27/14 0512 01/28/14 2234 01/29/14 0339  TROPONINI <0.30 <0.30 <0.30 <0.30 <0.30   CBG:  Recent Labs Lab 01/27/14 1701 01/28/14 0731 01/28/14 2246 01/29/14 0458 01/29/14 0806  GLUCAP 121* 133* 131* 124* 95    Recent Results (from the past 240 hour(s))  Urine culture     Status: None   Collection Time: 01/26/14  1:23 PM  Result Value Ref Range Status   Specimen Description URINE, CLEAN CATCH  Final   Special Requests NONE  Final   Culture  Setup Time   Final    01/26/2014 21:50 Performed at [place]   Final    >=100,000 COLONIES/ML Performed at [place] Performed at [facility]    Report Status 01/28/2014 FINAL  Final   Organism ID, Bacteria CITROBACTER FREUNDII  Final      Susceptibility   Citrobacter freundii - MIC*    CEFAZOLIN >=64 RESISTANT Resistant     CEFTRIAXONE <=1 SENSITIVE Sensitive     CIPROFLOXACIN 1 SENSITIVE Sensitive     GENTAMICIN <=1 SENSITIVE Sensitive     LEVOFLOXACIN 1 SENSITIVE Sensitive     NITROFURANTOIN <=16 SENSITIVE Sensitive     TOBRAMYCIN <=1 SENSITIVE Sensitive     TRIMETH/SULFA <=20 SENSITIVE Sensitive     PIP/TAZO  <=4 SENSITIVE Sensitive     * CITROBACTER FREUNDII     Scheduled Meds: . amLODipine  10 mg Oral q morning - 10a  . aspirin EC  81 mg Oral q morning - 10a  . cefTRIAXone   1 g  Intravenous Q24H  . clonazePAM  0.75 mg Oral TID  . docusate sodium  100 mg Oral BID  . enoxaparin  injection  50 mg Subcutaneous QHS  . hydrALAZINE  50 mg Oral TID  . insulin aspart  0-9 Units Subcutaneous 6 times per day  . insulin glargine  10 Units Subcutaneous QHS  . metoprolol  50 mg Oral BID  . pravastatin  40 mg Oral QHS  . tamsulosin  0.4 mg Oral QPC supper  . valproate sodium  500 mg Intravenous Q12H   Continuous Infusions:

## 2014-01-30 ENCOUNTER — Inpatient Hospital Stay (HOSPITAL_COMMUNITY): Payer: Medicare Other

## 2014-01-30 ENCOUNTER — Encounter (HOSPITAL_COMMUNITY): Payer: Self-pay | Admitting: Radiology

## 2014-01-30 DIAGNOSIS — I5042 Chronic combined systolic (congestive) and diastolic (congestive) heart failure: Secondary | ICD-10-CM

## 2014-01-30 LAB — BASIC METABOLIC PANEL
Anion gap: 12 (ref 5–15)
BUN: 19 mg/dL (ref 6–23)
CO2: 25 meq/L (ref 19–32)
CREATININE: 1.24 mg/dL (ref 0.50–1.35)
Calcium: 9.2 mg/dL (ref 8.4–10.5)
Chloride: 104 mEq/L (ref 96–112)
GFR calc Af Amer: 63 mL/min — ABNORMAL LOW (ref >90)
GFR, EST NON AFRICAN AMERICAN: 54 mL/min — AB (ref >90)
GLUCOSE: 109 mg/dL — AB (ref 70–99)
Potassium: 4.1 mEq/L (ref 3.7–5.3)
Sodium: 141 mEq/L (ref 137–147)

## 2014-01-30 LAB — GLUCOSE, CAPILLARY
GLUCOSE-CAPILLARY: 71 mg/dL (ref 70–99)
GLUCOSE-CAPILLARY: 97 mg/dL (ref 70–99)
Glucose-Capillary: 89 mg/dL (ref 70–99)
Glucose-Capillary: 125 mg/dL — ABNORMAL HIGH (ref 70–99)
Glucose-Capillary: 153 mg/dL — ABNORMAL HIGH (ref 70–99)
Glucose-Capillary: 150 mg/dL — ABNORMAL HIGH (ref 70–99)

## 2014-01-30 LAB — CBC
HCT: 43.3 % (ref 39.0–52.0)
Hemoglobin: 14 g/dL (ref 13.0–17.0)
MCH: 31.5 pg (ref 26.0–34.0)
MCHC: 32.3 g/dL (ref 30.0–36.0)
MCV: 97.3 fL (ref 78.0–100.0)
Platelets: 278 10*3/uL (ref 150–400)
RBC: 4.45 MIL/uL (ref 4.22–5.81)
RDW: 13.6 % (ref 11.5–15.5)
WBC: 5.4 10*3/uL (ref 4.0–10.5)

## 2014-01-30 MED ORDER — CETYLPYRIDINIUM CHLORIDE 0.05 % MT LIQD
7.0000 mL | Freq: Two times a day (BID) | OROMUCOSAL | Status: DC
  Administered 2014-01-31 – 2014-02-04 (×8): 7 mL via OROMUCOSAL

## 2014-01-30 MED ORDER — SODIUM CHLORIDE 0.9 % IV SOLN
INTRAVENOUS | Status: DC
  Administered 2014-01-30: 1000 mL via INTRAVENOUS
  Administered 2014-01-31: 17:00:00 via INTRAVENOUS

## 2014-01-30 MED ORDER — HYDRALAZINE HCL 20 MG/ML IJ SOLN
5.0000 mg | INTRAMUSCULAR | Status: DC | PRN
Start: 2014-01-30 — End: 2014-02-04
  Administered 2014-01-31 (×2): 5 mg via INTRAVENOUS
  Filled 2014-01-30 (×3): qty 1

## 2014-01-30 NOTE — Progress Notes (Signed)
Pt has had two episodes of shakiness today.One episode  At 11am when he stood to void with help.This lasted about 60 seconds.Second episode of shaking at 330pm lasting about 1-2 minutes.Pt was in bed at this time. He is alert to name and place,denies pain.Family is at bedside and have talked to Dr Doyle Askew today. They will talk with doctors at [place] tomorrow. Neurologist was in today and was updated.

## 2014-01-30 NOTE — Progress Notes (Signed)
Pt had two episodes of seizure like shaking during the shift, pt did not loose consciousness during the episodes. The two events happened right after patient got up from the bed to void in the urinal. Pt was assisted back to bed and it resolved after about 5 minutes.

## 2014-01-30 NOTE — Progress Notes (Signed)
Subjective: Patient has continued to have episodes of myoclonic-like jerking of extremities. The last 2 episodes have occurred in association with standing and voiding. Duration was about 30 seconds. He is no longer having startle responses with myoclonic activity. No adverse events reported otherwise.  Objective: Current vital signs: BP 161/70 mmHg  Pulse 75  Temp(Src) 98 F (36.7 C) (Oral)  Resp 20  Ht 6' (1.829 m)  Wt 102.6 kg (226 lb 3.1 oz)  BMI 30.67 kg/m2  SpO2 95%  Neurologic Exam: Patient was easy to arouse. He was oriented to correct age and to lesser extent place. He was in no acute distress. Speech was normal. Patient had normal movements of extremities with no tremor and no myoclonus. Strength was normal throughout. Coordination was normal.  Medications: I have reviewed the patient's current medications.  Assessment/Plan: New-onset movement disorder with myoclonus as well as progressive cognitive decline. Etiology is unclear. Previous workup with MRI and long-term EEG recording was unremarkable. Myoclonus appears to have improved following discontinuation of Keppra and starting Depakote.  Recommend no changes in current management with continuing clonazepam and Depakote for control of myoclonus. We will continue to follow this patient with you.  C.R. Nicole Kindred, MD [facility] [phone]  01/30/2014  11:01 AM

## 2014-01-30 NOTE — Progress Notes (Signed)
Patient ID: [name], male   DOB: Mar 23, 1934, 79 y.o.   MRN: [id number]  [facility] PROGRESS NOTE  [name] [id number] DOB: [birth year] DOA: 02-01-2014 PCP: [name], MD   Brief narrative: Pt is 79 y.o. Male with HTN, HLD, DM, CAD, MI, recently diagnosed with myoclonus at [facility], discharged from [facility] 01/27/2014 and readmitted due to recurrence of uncontrolled myoclonic activity.   Assessment and Plan:    Active Problems:  Myoclonus - recently started on Keppra and Clonazepam but Keppra has been discontinued per neurology team - pt started on Depakote 500 mg BID and Clonazepam, both meds to be continued  - PT evaluation once pt able to participate   Hypokalemia - supplemented and WNL this AM  Essential hypertension - reasonable inpatient control  - continue Norvasc, Metoprolol, Hydralazine   Urinary tract infection - urine culture 12/17 with citrobacter sp. - continue Rocephin  - pt's daughter asked to see urologist to discuss recurrent UTI as she believes this is contributing to worsening myoclonus - do not think this requires inpatient consultation but will also ask dr. Alinda Money to address with family   Chronic combined systolic and diastolic congestive heart failure - 2 D ECHO 01/27/2014 with EF 45 - 50% and grade I diastolic dysfunction  - clinically stable and euvolemic this AM - monitor daily weights, weight on admission: 223 lbs --> 226 lbs this AM  DM type 2 causing renal disease - reasonable inpatient control - continue Lantus and SSI   Encephalopathy, metabolic - secondary to UTI - now at baseline mental state   DVT prophylaxis  Lovenox SQ while pt is in hospital  Code Status: Full Family Communication: Pt and daughter at bedside Disposition Plan: Possible transfer to tele if no further myoclonic episodes   IV Access:    Peripheral IV Procedures and diagnostic studies:    Ct Head Wo Contrast  2014-02-01 No acute intracranial pathology seen on CT. 2. Mild cortical volume loss noted. 3. Trace fluid again noted within the right mastoid air cells.   Mr Cervical Spine Wo Contrast 01/27/2014 1. 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 2. Multifactorial moderate or severe neural foraminal stenosis at the bilateral C4, right C5, and bilateral C6 nerve levels. 3. Posterior element ankylosis at C4-C5 with severe facet hypertrophy on the right. Bulky endplate osteophytes without ankylosis at C5-C6. Bulky endplate osteophytes with ankylosis from C6 to the upper thoracic spine. 4. Mild multifactorial upper thoracic spinal stenosis at T2-T3.  Medical Consultants:    Neurology  Other Consultants:    Physical therapy  Anti-Infectives:    Rocephin 02-02-23 -->  [name], MD  [facility] Pager [phone]  If 7PM-7AM, please contact night-coverage [email or web address] Password [facility] 01/30/2014, 2:43 PM   LOS: 2 days   HPI/Subjective: No events overnight.   Objective: Filed Vitals:   01/30/14 1200 01/30/14 1202 01/30/14 1400 01/30/14 1425  BP: 169/75  185/103 149/72  Pulse: 73  56 58  Temp:  97.8 F (36.6 C)    TempSrc:  Oral    Resp: 18  23 21   Height:      Weight:      SpO2: 99%  97% 94%    Intake/Output Summary (Last 24 hours) at 01/30/14 1443 Last data filed at 01/30/14 1400  Gross per 24 hour  Intake   1483 ml  Output   1450  ml  Net     33 ml    Exam:   General:  Pt is alert, follows commands appropriately, not in acute distress  Cardiovascular: Regular rate and rhythm, no rubs, no gallops  Respiratory: Clear to auscultation bilaterally, no wheezing, no crackles, no rhonchi  Abdomen: Soft, non tender, non distended, bowel sounds present, no guarding  Data Reviewed: Basic Metabolic Panel:  Recent Labs Lab 01/26/14 1145  01/27/14 0512 01/28/14 0731 01/28/14 1938 01/28/14 2231 01/29/14 0340 01/30/14 0336  NA 137 143  --  142  --  143 141  K 3.5* 3.2* 3.6* 3.7  --  3.5* 4.1  CL 99 104  --  104  --  106 104  CO2 24 26  --  24  --  23 25  GLUCOSE 141* 108*  --  132*  --  122* 109*  BUN 19 16  --  16  --  17 19  CREATININE 1.06 1.08  --  1.16 1.11 1.13 1.24  CALCIUM 9.7 9.1  --  9.7  --  9.2 9.2  MG  --   --   --   --   --  1.9  --   PHOS  --   --   --   --   --  3.8  --    Liver Function Tests:  Recent Labs Lab 01/26/14 1145 01/27/14 0512 01/29/14 0340  AST 19 16 17   ALT 30 27 27   ALKPHOS 62 56 56  BILITOT 0.4 0.3 0.3  PROT 6.7 6.1 6.1  ALBUMIN 3.7 3.4* 3.4*   No results for input(s): LIPASE, AMYLASE in the last 168 hours. No results for input(s): AMMONIA in the last 168 hours. CBC:  Recent Labs Lab 01/26/14 1145 01/27/14 0512 01/28/14 1938 01/28/14 2231 01/29/14 0340 01/30/14 0336  WBC 5.8 5.1 7.0 6.5 5.5 5.4  NEUTROABS 3.0  --  4.2  --   --   --   HGB 14.8 14.3 15.1 14.9 14.4 14.0  HCT 44.2 42.0 44.1 43.8 43.4 43.3  MCV 95.9 95.5 95.5 95.4 98.2 97.3  PLT 317 252 299 275 226 278   Cardiac Enzymes:  Recent Labs Lab 01/27/14 0512 01/28/14 2234 01/29/14 0339 01/29/14 1012 01/29/14 1755  TROPONINI <0.30 <0.30 <0.30 <0.30 <0.30   BNP: Invalid input(s): POCBNP CBG:  Recent Labs Lab 01/29/14 1912 01/29/14 2346 01/30/14 0449 01/30/14 0718 01/30/14 1136  GLUCAP 156* 71 89 97 125*    Recent Results (from the past 240 hour(s))  Urine culture     Status: None   Collection Time: 01/26/14  1:23 PM  Result Value Ref Range Status   Specimen Description URINE, CLEAN CATCH  Final   Special Requests NONE  Final   Culture  Setup Time   Final    01/26/2014 21:50 Performed at [place]   Final    >=100,000 COLONIES/ML Performed at [place] Performed at [facility]    Report  Status 01/28/2014 FINAL  Final   Organism ID, Bacteria CITROBACTER FREUNDII  Final      Susceptibility   Citrobacter freundii - MIC*    CEFAZOLIN >=64 RESISTANT Resistant     CEFTRIAXONE <=1 SENSITIVE Sensitive     CIPROFLOXACIN 1 SENSITIVE Sensitive     GENTAMICIN <=1 SENSITIVE Sensitive     LEVOFLOXACIN 1 SENSITIVE Sensitive     NITROFURANTOIN <=  16 SENSITIVE Sensitive     TOBRAMYCIN <=1 SENSITIVE Sensitive     TRIMETH/SULFA <=20 SENSITIVE Sensitive     PIP/TAZO <=4 SENSITIVE Sensitive     * CITROBACTER FREUNDII     Scheduled Meds: . amLODipine  10 mg Oral q morning - 10a  . antiseptic oral rinse  7 mL Mouth Rinse BID  . aspirin EC  81 mg Oral q morning - 10a  . cefTRIAXone (ROCEPHIN) IVPB 1 gram/50 mL D5W  1 g Intravenous Q24H  . clonazePAM  0.75 mg Oral TID  . docusate sodium  100 mg Oral BID  . enoxaparin (LOVENOX) injection  50 mg Subcutaneous QHS  . hydrALAZINE  50 mg Oral TID  . insulin aspart  0-9 Units Subcutaneous 6 times per day  . insulin glargine  10 Units Subcutaneous QHS  . metoprolol  50 mg Oral BID  . pravastatin  40 mg Oral QHS  . sodium chloride  3 mL Intravenous Q12H  . tamsulosin  0.4 mg Oral QPC supper  . valproate sodium  500 mg Intravenous Q12H   Continuous Infusions:

## 2014-01-30 NOTE — Progress Notes (Signed)
Discussed with family at bedside, pt with history of kidney stones, worrisome if still present. Will proceed with CT abd/pelvis to evaluate as per family pt has history of kidney stones but has been asymptomatic in the past.  [name], MD  [facility] Pager [phone]  If 7PM-7AM, please contact night-coverage [email or web address] Password [email or web address]

## 2014-01-30 NOTE — Progress Notes (Signed)
OT Cancellation Note  Patient Details Name: [name] MRN: [id number] DOB: 01/16/1935   Cancelled Treatment:    Reason Eval/Treat Not Completed: Medical issues which prohibited therapy (seizure-like activity.) Will continue to follow.  Malka So 01/30/2014, 9:33 AM

## 2014-01-30 NOTE — Progress Notes (Signed)
PT Cancellation Note  Patient Details Name: TRAVER MECKES MRN: [id number] DOB: 02-19-1934   Cancelled Treatment:     PT evaluation attempted this am but deferred 2* pt currently having seizures..  Will follow.   Rangel Echeverri 01/30/2014, 9:59 AM

## 2014-01-30 NOTE — Progress Notes (Signed)
Chaplain provided support with pt's daughter.  Expressed hope around finding answers to father's illness.  Stress around caring for father through recent hospitalizations.  Daughter describes Pt as active, having cared for others in neighborhood by performing repairs on appliances and mowing grass.  Spoke with chaplain about difficulty in seeing him in bed and unable to move.

## 2014-01-30 NOTE — Progress Notes (Signed)
Clinical Social Work  CSW received a call from [facility] reporting patient was from their facility and was readmitted on Saturday but had to be sent back to the hospital because their MD did not feel patient was stable. [facility] reports that their MD wants to ensure that patient is medically stable prior to returning. Per chart review, patient in ICU and not appropriate to discuss DC plans with patient or family at this time. Full assessment to follow.  Sindy Messing, LCSW  (Coverage for [employer])

## 2014-01-31 LAB — CBC
HCT: 43.6 % (ref 39.0–52.0)
HEMOGLOBIN: 14.2 g/dL (ref 13.0–17.0)
MCH: 31.4 pg (ref 26.0–34.0)
MCHC: 32.6 g/dL (ref 30.0–36.0)
MCV: 96.5 fL (ref 78.0–100.0)
Platelets: 290 10*3/uL (ref 150–400)
RBC: 4.52 MIL/uL (ref 4.22–5.81)
RDW: 13.5 % (ref 11.5–15.5)
WBC: 4.7 10*3/uL (ref 4.0–10.5)

## 2014-01-31 LAB — BASIC METABOLIC PANEL
Anion gap: 9 (ref 5–15)
BUN: 18 mg/dL (ref 6–23)
CALCIUM: 9 mg/dL (ref 8.4–10.5)
CO2: 26 mmol/L (ref 19–32)
Chloride: 108 mEq/L (ref 96–112)
Creatinine, Ser: 1.01 mg/dL (ref 0.50–1.35)
GFR, EST AFRICAN AMERICAN: 81 mL/min — AB (ref >90)
GFR, EST NON AFRICAN AMERICAN: 70 mL/min — AB (ref >90)
GLUCOSE: 92 mg/dL (ref 70–99)
POTASSIUM: 3.5 mmol/L (ref 3.5–5.1)
Sodium: 143 mmol/L (ref 135–145)

## 2014-01-31 LAB — GLUCOSE, CAPILLARY
GLUCOSE-CAPILLARY: 90 mg/dL (ref 70–99)
Glucose-Capillary: 102 mg/dL — ABNORMAL HIGH (ref 70–99)
Glucose-Capillary: 127 mg/dL — ABNORMAL HIGH (ref 70–99)
Glucose-Capillary: 140 mg/dL — ABNORMAL HIGH (ref 70–99)
Glucose-Capillary: 227 mg/dL — ABNORMAL HIGH (ref 70–99)
Glucose-Capillary: 94 mg/dL (ref 70–99)

## 2014-01-31 MED ORDER — POTASSIUM CHLORIDE CRYS ER 20 MEQ PO TBCR
30.0000 meq | EXTENDED_RELEASE_TABLET | Freq: Once | ORAL | Status: AC
  Administered 2014-01-31: 30 meq via ORAL
  Filled 2014-01-31 (×2): qty 1

## 2014-01-31 MED ORDER — DIPHENHYDRAMINE HCL 50 MG/ML IJ SOLN
25.0000 mg | INTRAMUSCULAR | Status: DC | PRN
  Administered 2014-01-31 – 2014-02-02 (×3): 25 mg via INTRAVENOUS
  Filled 2014-01-31 (×3): qty 1

## 2014-01-31 NOTE — Evaluation (Signed)
Occupational Therapy Evaluation Patient Details Name: [name] MRN: [id number] DOB: Mar 28, 1934 Today's Date: 01/31/2014    History of Present Illness Pt was admitted from [facility] for agitation and tremor.  He was dx'd with myoclonus at [place] has a h/o CAD, syncope, HTN and MI   Clinical Impression   This 79 year old man was admitted for the above.   Uncertain of PLOF.  Pt moves well but needs A x 2 for safety due to body tremors, h/o myoclonus.  Will follow in acute with min guard level goals. Pt came from [facility].    Follow Up Recommendations  SNF    Equipment Recommendations  3 in 1 bedside comode    Recommendations for Other Services       Precautions / Restrictions Precautions Precautions: Fall (seizure)      Mobility Bed Mobility Overal bed mobility: Needs Assistance Bed Mobility: Supine to Sit;Sit to Supine     Supine to sit: Min guard Sit to supine: Min guard   General bed mobility comments:  (for lines and safety)  Transfers Overall transfer level: Needs assistance Equipment used: Rolling walker (2 wheeled) Transfers: Sit to/from Stand Sit to Stand: Min assist;+2 safety/equipment         General transfer comment: assist to steady    Balance                                            ADL Overall ADL's : Needs assistance/impaired     Grooming: Set up;Sitting   Upper Body Bathing: Set up;Sitting   Lower Body Bathing: Minimal assistance;+2 for safety/equipment;Sit to/from stand   Upper Body Dressing : Minimal assistance;Sitting   Lower Body Dressing: Minimal assistance;+2 for safety/equipment;Sit to/from stand   Toilet Transfer: Minimal assistance;+2 for safety/equipment;Ambulation (bed, ambulated, recliner then back to bed)             General ADL Comments: Pt had 3 episodes of shaking during session.  Needs A x 2 for safety for any standing tasks.       Vision                      Perception     Praxis      Pertinent Vitals/Pain       Hand Dominance     Extremity/Trunk Assessment Upper Extremity Assessment Upper Extremity Assessment: Overall WFL for tasks assessed           Communication Communication Communication: No difficulties   Cognition Arousal/Alertness: Awake/alert Behavior During Therapy: WFL for tasks assessed/performed  Pt pleasant and cooperative Overall Cognitive Status: History of cognitive impairments - at baseline       Memory:  (progressive cognitive decline per chart:  followed commands, some extra time to verbally respond, but motor responses had no delay.  Needs assistance for safety             General Comments       Exercises       Shoulder Instructions      Home Living Family/patient expects to be discharged to:: Skilled nursing facility                                        Prior Functioning/Environment  Comments: came from [facility]    OT Diagnosis: Generalized weakness   OT Problem List: Decreased strength;Decreased activity tolerance;Decreased safety awareness;Decreased knowledge of use of DME or AE   OT Treatment/Interventions: Self-care/ADL training;DME and/or AE instruction;Patient/family education;Therapeutic activities    OT Goals(Current goals can be found in the care plan section) Acute Rehab OT Goals Patient Stated Goal: none stated:  agreeable to therapy OT Goal Formulation: With patient Time For Goal Achievement: 02/14/14 Potential to Achieve Goals: Good ADL Goals Pt Will Perform Grooming: with min guard assist;standing Pt Will Perform Lower Body Bathing: with min guard assist;sit to/from stand Pt Will Perform Lower Body Dressing: with min guard assist;sit to/from stand Pt Will Transfer to Toilet: with min guard assist;ambulating;bedside commode Pt Will Perform Toileting - Clothing Manipulation and hygiene: with min guard assist;sit to/from stand  OT Frequency:  Min 2X/week   Barriers to D/C:            Co-evaluation PT/OT/SLP Co-Evaluation/Treatment: Yes Reason for Co-Treatment: For patient/therapist safety PT goals addressed during session: Mobility/safety with mobility OT goals addressed during session: ADL's and self-care      End of Session    Activity Tolerance: Patient tolerated treatment well Patient left: in bed;with call bell/phone within reach;with bed alarm set   Time: 1342-1411 OT Time Calculation (min): 29 min Charges:  OT General Charges $OT Visit: 1 Procedure OT Evaluation $Initial OT Evaluation Tier I: 1 Procedure OT Treatments $Self Care/Home Management : 8-22 mins G-Codes:    Tamecca Artiga 02/05/2014, 2:25 PM Lesle Chris, OTR/L [phone] Feb 05, 2014

## 2014-01-31 NOTE — Progress Notes (Signed)
Pt had an episode of alternating strong to weak tremors involving trunk and all extremities except left arm lasting approximately 5 minutes. Pt able to follow commands and answer simple questions during entire episode. VSS during and after episode. Family at bedside during episode and stated these episodes are similar to prior ones. Will continue to monitor.   Idelle Crouch, RN

## 2014-01-31 NOTE — Plan of Care (Signed)
Problem: Phase I Progression Outcomes Goal: OOB as tolerated unless otherwise ordered Outcome: Progressing OOB to chair and ambulate in hall with PT today.

## 2014-01-31 NOTE — Evaluation (Signed)
Physical Therapy Evaluation Patient Details Name: [name] MRN: [id number] DOB: 10-03-1934 Today's Date: 01/31/2014   History of Present Illness  Pt was admitted from [facility] for agitation and tremor.  He was dx'd with myoclonus at [facility] has a h/o CAD, syncope, HTN and MI  Clinical Impression  Pt pleasant and cooperative but currently limited by decreased endurance and ambulatory balance deficits including those related to myoclonus.  Pt would benefit from follow up rehab at SNF level  Follow Up Recommendations SNF    Equipment Recommendations  None recommended by PT    Recommendations for Other Services OT consult     Precautions / Restrictions Precautions Precautions: Fall Restrictions Weight Bearing Restrictions: No      Mobility  Bed Mobility Overal bed mobility: Needs Assistance Bed Mobility: Supine to Sit;Sit to Supine     Supine to sit: Min guard Sit to supine: Min guard   General bed mobility comments: pulled up on rail - pt with tremors on initial sitting  Transfers Overall transfer level: Needs assistance Equipment used: Rolling walker (2 wheeled) Transfers: Sit to/from Stand Sit to Stand: Min assist;+2 safety/equipment         General transfer comment: assist to steady - pt with tremors on initial standing  Ambulation/Gait Ambulation/Gait assistance: Min assist Ambulation Distance (Feet): 90 Feet Assistive device: Rolling walker (2 wheeled) Gait Pattern/deviations: Step-to pattern;Step-through pattern;Decreased step length - right;Decreased step length - left;Shuffle;Trunk flexed Gait velocity: decr   General Gait Details: cues for pace, posture and position from RW. Several short standing rests required to complete task  Stairs            Wheelchair Mobility    Modified Rankin (Stroke Patients Only)       Balance     Sitting balance-Leahy Scale: Fair       Standing balance-Leahy Scale: Poor                               Pertinent Vitals/Pain      Home Living Family/patient expects to be discharged to:: Skilled nursing facility                      Prior Function           Comments: came from [facility]: Right    Extremity/Trunk Assessment   Upper Extremity Assessment: Overall WFL for tasks assessed           Lower Extremity Assessment: Overall WFL for tasks assessed      Cervical / Trunk Assessment: Kyphotic  Communication   Communication: No difficulties  Cognition Arousal/Alertness: Awake/alert Behavior During Therapy: WFL for tasks assessed/performed Overall Cognitive Status: History of cognitive impairments - at baseline       Memory:  (progressive cognitive decline per chart:  followed commands)              General Comments      Exercises        Assessment/Plan    PT Assessment Patient needs continued PT services  PT Diagnosis Difficulty walking   PT Problem List Decreased activity tolerance;Decreased balance;Decreased mobility;Decreased knowledge of use of DME;Obesity;Decreased safety awareness  PT Treatment Interventions DME instruction;Gait training;Functional mobility training;Therapeutic activities;Therapeutic exercise;Patient/family education   PT Goals (Current goals can be found in the Care Plan section) Acute Rehab PT Goals Patient Stated Goal:  Want to walk PT Goal Formulation: With patient Time For Goal Achievement: 02/14/14 Potential to Achieve Goals: Fair    Frequency Min 3X/week   Barriers to discharge        Co-evaluation PT/OT/SLP Co-Evaluation/Treatment: Yes Reason for Co-Treatment: For patient/therapist safety PT goals addressed during session: Mobility/safety with mobility OT goals addressed during session: ADL's and self-care       End of Session Equipment Utilized During Treatment: Gait belt Activity Tolerance: Patient tolerated treatment well;Patient  limited by fatigue Patient left: in bed;with call bell/phone within reach Nurse Communication: Mobility status         Time: 1345-1414 PT Time Calculation (min) (ACUTE ONLY): 29 min   Charges:   PT Evaluation $Initial PT Evaluation Tier I: 1 Procedure PT Treatments $Gait Training: 8-22 mins   PT G Codes:          Keval Nam 01/31/2014, 4:10 PM

## 2014-01-31 NOTE — Progress Notes (Signed)
Patient ID: [name], male   DOB: 09/28/1934, 79 y.o.   MRN: [id number]  [facility] PROGRESS NOTE  XADRIAN Mathews JAS:[id number] DOB: 07-Sep-1934 DOA: 01/28/2014 PCP: [name], MD  Brief narrative: Pt is 79 y.o. Male with HTN, HLD, DM, CAD, MI, recently diagnosed with myoclonus at [facility], discharged from [facility] 01/27/2014 and readmitted due to recurrence of uncontrolled myoclonic activity.   Assessment and Plan:    Active Problems:  Myoclonus - recently started on Keppra and Clonazepam but Keppra has been discontinued per neurology team - pt started on Depakote 500 mg BID and Clonazepam, both meds to be continued  - PT evaluation once pt able to participate   Hypokalemia - supplemented and WNL this AM  Essential hypertension - reasonable inpatient control  - continue Norvasc, Metoprolol, Hydralazine   Urinary tract infection - urine culture 12/17 with citrobacter sp. - continue Rocephin  - pt's daughter asked to see urologist to discuss recurrent UTI as she believes this is contributing to worsening myoclonus - do not think this requires inpatient consultation but will also ask dr. Alinda Money to address with family    Prostate enlargement  - seen on CT abd and pelvis - pt has known BPH per family - PSA pending  - will d/w Dr. Alinda Money once PSA is back   Chronic combined systolic and diastolic congestive heart failure - 2 D ECHO 01/27/2014 with EF 45 - 50% and grade I diastolic dysfunction  - clinically stable and euvolemic this AM - monitor daily weights, weight on admission: 223 lbs --> 226 lbs this AM  DM type 2 causing renal disease - reasonable inpatient control - continue Lantus and SSI   Encephalopathy, metabolic - secondary to UTI - now at baseline mental state   DVT prophylaxis  Lovenox SQ while pt is in hospital  Code Status: Full Family Communication: Pt, sister, and daughter at bedside Disposition Plan:  Possible transfer to tele if no further myoclonic episodes   IV Access:    Peripheral IV Procedures and diagnostic studies:    Ct Head Wo Contrast 01/28/2014 No acute intracranial pathology seen on CT. 2. Mild cortical volume loss noted. 3. Trace fluid again noted within the right mastoid air cells.   Mr Cervical Spine Wo Contrast 01/27/2014 1. 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 2. Multifactorial moderate or severe neural foraminal stenosis at the bilateral C4, right C5, and bilateral C6 nerve levels. 3. Posterior element ankylosis at C4-C5 with severe facet hypertrophy on the right. Bulky endplate osteophytes without ankylosis at C5-C6. Bulky endplate osteophytes with ankylosis from C6 to the upper thoracic spine. 4. Mild multifactorial upper thoracic spinal stenosis at T2-T3.  Ct Abdomen Pelvis Wo Contrast  01/31/2014  No evidence of hydronephrosis. No obstructing ureteral stone seen. 2. New tiny calcification along the right side of the posterior base of the bladder. This could reflect a recently passed stone, or possibly a small vascular calcification. 3. Stable slight prominence of the right renal collecting system, unchanged from the prior study. Scattered tiny nonobstructing bilateral renal stones, measuring up to 3 mm in size. 4. Borderline prominent right paratracheal node, measuring 1.1 cm in short axis, of uncertain significance. 5. Relatively diffuse coronary artery calcifications seen. 6. Minimal diverticulosis along the distal descending and proximal sigmoid colon, without evidence of diverticulitis. 7. Significantly enlarged prostate again noted, with nodular extension  into the base of the bladder. Would correlate with PSA.    Medical Consultants:    Neurology  Other Consultants:    Physical therapy  Anti-Infectives:     Rocephin 12/19 -->  [name], MD  [facility] Pager [phone]  If 7PM-7AM, please contact night-coverage [email or web address] Password [facility] 01/31/2014, 11:45 AM   LOS: 3 days   HPI/Subjective: No events overnight.   Objective: Filed Vitals:   01/31/14 0300 01/31/14 0400 01/31/14 0500 01/31/14 0800  BP:  170/69  169/78  Pulse: 67 63 53 63  Temp:  97.5 F (36.4 C)  98.3 F (36.8 C)  TempSrc:  Oral  Oral  Resp: 21 20 17 18   Height:      Weight:      SpO2: 98% 97% 97% 97%    Intake/Output Summary (Last 24 hours) at 01/31/14 1145 Last data filed at 01/31/14 1100  Gross per 24 hour  Intake    735 ml  Output   2475 ml  Net  -1740 ml    Exam:   General:  Pt is alert, follows commands appropriately, not in acute distress  Cardiovascular: Regular rate and rhythm, S1/S2, no murmurs, no rubs, no gallops  Respiratory: Clear to auscultation bilaterally, no wheezing, no crackles, no rhonchi  Abdomen: Soft, non tender, non distended, bowel sounds present, no guarding  Data Reviewed: Basic Metabolic Panel:  Recent Labs Lab 01/27/14 0512 01/28/14 0731 01/28/14 1938 01/28/14 2231 01/29/14 0340 01/30/14 0336 01/31/14 0355  NA 143  --  142  --  143 141 143  K 3.2* 3.6* 3.7  --  3.5* 4.1 3.5  CL 104  --  104  --  106 104 108  CO2 26  --  24  --  23 25 26   GLUCOSE 108*  --  132*  --  122* 109* 92  BUN 16  --  16  --  17 19 18   CREATININE 1.08  --  1.16 1.11 1.13 1.24 1.01  CALCIUM 9.1  --  9.7  --  9.2 9.2 9.0  MG  --   --   --   --  1.9  --   --   PHOS  --   --   --   --  3.8  --   --    Liver Function Tests:  Recent Labs Lab 01/26/14 1145 01/27/14 0512 01/29/14 0340  AST 19 16 17   ALT 30 27 27   ALKPHOS 62 56 56  BILITOT 0.4 0.3 0.3  PROT 6.7 6.1 6.1  ALBUMIN 3.7 3.4* 3.4*   No results for input(s): LIPASE, AMYLASE in the last 168 hours. No results for input(s): AMMONIA in the last 168 hours. CBC:  Recent Labs Lab 01/26/14 1145  01/28/14 1938  01/28/14 2231 01/29/14 0340 01/30/14 0336 01/31/14 0355  WBC 5.8  < > 7.0 6.5 5.5 5.4 4.7  NEUTROABS 3.0  --  4.2  --   --   --   --   HGB 14.8  < > 15.1 14.9 14.4 14.0 14.2  HCT 44.2  < > 44.1 43.8 43.4 43.3 43.6  MCV 95.9  < > 95.5 95.4 98.2 97.3 96.5  PLT 317  < > 299 275 226 278 290  < > = values in this interval not displayed. Cardiac Enzymes:  Recent Labs Lab 01/27/14 0512 01/28/14 2234 01/29/14 0339 01/29/14 1012 01/29/14 1755  TROPONINI <0.30 <0.30 <0.30 <0.30 <0.30   BNP: Invalid input(s): POCBNP CBG:  Recent  Labs Lab 01/30/14 1547 01/30/14 1941 01/31/14 0010 01/31/14 0349 01/31/14 0754  GLUCAP 153* 150* 127* 90 102*    Recent Results (from the past 240 hour(s))  Urine culture     Status: None   Collection Time: 01/26/14  1:23 PM  Result Value Ref Range Status   Specimen Description URINE, CLEAN CATCH  Final   Special Requests NONE  Final   Culture  Setup Time   Final    01/26/2014 21:50 Performed at [facility]   Final    >=100,000 COLONIES/ML Performed at [place] Performed at [facility]    Report Status 01/28/2014 FINAL  Final   Organism ID, Bacteria CITROBACTER FREUNDII  Final      Susceptibility   Citrobacter freundii - MIC*    CEFAZOLIN >=64 RESISTANT Resistant     CEFTRIAXONE <=1 SENSITIVE Sensitive     CIPROFLOXACIN 1 SENSITIVE Sensitive     GENTAMICIN <=1 SENSITIVE Sensitive     LEVOFLOXACIN 1 SENSITIVE Sensitive     NITROFURANTOIN <=16 SENSITIVE Sensitive     TOBRAMYCIN <=1 SENSITIVE Sensitive     TRIMETH/SULFA <=20 SENSITIVE Sensitive     PIP/TAZO <=4 SENSITIVE Sensitive     * CITROBACTER FREUNDII     Scheduled Meds: . amLODipine  10 mg Oral q morning - 10a  . antiseptic oral rinse  7 mL Mouth Rinse BID  . aspirin EC  81 mg Oral q morning - 10a  . cefTRIAXone (ROCEPHIN) IVPB 1 gram/50 mL D5W  1 g Intravenous Q24H  . clonazePAM  0.75  mg Oral TID  . docusate sodium  100 mg Oral BID  . enoxaparin (LOVENOX) injection  50 mg Subcutaneous QHS  . hydrALAZINE  50 mg Oral TID  . insulin aspart  0-9 Units Subcutaneous 6 times per day  . insulin glargine  10 Units Subcutaneous QHS  . metoprolol  50 mg Oral BID  . pravastatin  40 mg Oral QHS  . sodium chloride  3 mL Intravenous Q12H  . tamsulosin  0.4 mg Oral QPC supper  . valproate sodium  500 mg Intravenous Q12H   Continuous Infusions: . sodium chloride 1,000 mL (01/30/14 1800)

## 2014-01-31 NOTE — Progress Notes (Signed)
Subjective: No further over night episodes of myoclonic like jerking per patient   Objective: Current vital signs: BP 170/69 mmHg  Pulse 53  Temp(Src) 98.3 F (36.8 C) (Oral)  Resp 17  Ht 6' (1.829 m)  Wt 102.6 kg (226 lb 3.1 oz)  BMI 30.67 kg/m2  SpO2 97% Vital signs in last 24 hours: Temp:  [97.5 F (36.4 C)-98.4 F (36.9 C)] 98.3 F (36.8 C) (12/22 0800) Pulse Rate:  [51-79] 53 (12/22 0500) Resp:  [10-23] 17 (12/22 0500) BP: (141-185)/(47-103) 170/69 mmHg (12/22 0400) SpO2:  [94 %-99 %] 97 % (12/22 0500)  Intake/Output from previous day: 12/21 0701 - 12/22 0700 In: 1290 [P.O.:940; I.V.:110; IV Piggyback:160] Out: 2175 [Urine:2175] Intake/Output this shift:   Nutritional status: Diet Carb Modified  Neurologic Exam: General: NAD Mental Status: Alert, oriented, thought content appropriate.  Speech fluent without evidence of aphasia.  Able to follow 3 step commands without difficulty. Cranial Nerves: II:  Visual fields grossly normal, pupils equal, round, reactive to light and accommodation III,IV, VI: ptosis not present, extra-ocular motions intact bilaterally V,VII: smile symmetric, facial light touch sensation normal bilaterally VIII: hearing normal bilaterally IX,X: gag reflex present XI: bilateral shoulder shrug XII: midline tongue extension without atrophy or fasciculations  Motor: Moving all extremities antigravity. No tremor, myoclonic activity or abnormal movements noted.  Sensory: Pinprick and light touch intact throughout, bilaterally Deep Tendon Reflexes:  Right: Upper Extremity   Left: Upper extremity   biceps (C-5 to C-6) 2/4   biceps (C-5 to C-6) 2/4 tricep (C7) 2/4    triceps (C7) 2/4 Brachioradialis (C6) 2/4  Brachioradialis (C6) 2/4  1+ bilateral KJ and no AJ  Plantars: Mute bilaterally   Lab Results: Basic Metabolic Panel:  Recent Labs Lab 01/27/14 0512 01/28/14 0731 01/28/14 1938 01/28/14 2231 01/29/14 0340 01/30/14 0336  01/31/14 0355  NA 143  --  142  --  143 141 143  K 3.2* 3.6* 3.7  --  3.5* 4.1 3.5  CL 104  --  104  --  106 104 108  CO2 26  --  24  --  23 25 26   GLUCOSE 108*  --  132*  --  122* 109* 92  BUN 16  --  16  --  17 19 18   CREATININE 1.08  --  1.16 1.11 1.13 1.24 1.01  CALCIUM 9.1  --  9.7  --  9.2 9.2 9.0  MG  --   --   --   --  1.9  --   --   PHOS  --   --   --   --  3.8  --   --     Liver Function Tests:  Recent Labs Lab 01/26/14 1145 01/27/14 0512 01/29/14 0340  AST 19 16 17   ALT 30 27 27   ALKPHOS 62 56 56  BILITOT 0.4 0.3 0.3  PROT 6.7 6.1 6.1  ALBUMIN 3.7 3.4* 3.4*   No results for input(s): LIPASE, AMYLASE in the last 168 hours. No results for input(s): AMMONIA in the last 168 hours.  CBC:  Recent Labs Lab 01/26/14 1145  01/28/14 1938 01/28/14 2231 01/29/14 0340 01/30/14 0336 01/31/14 0355  WBC 5.8  < > 7.0 6.5 5.5 5.4 4.7  NEUTROABS 3.0  --  4.2  --   --   --   --   HGB 14.8  < > 15.1 14.9 14.4 14.0 14.2  HCT 44.2  < > 44.1 43.8 43.4 43.3 43.6  MCV  95.9  < > 95.5 95.4 98.2 97.3 96.5  PLT 317  < > 299 275 226 278 290  < > = values in this interval not displayed.  Cardiac Enzymes:  Recent Labs Lab 01/27/14 0512 01/28/14 2234 01/29/14 0339 01/29/14 1012 01/29/14 1755  TROPONINI <0.30 <0.30 <0.30 <0.30 <0.30    Lipid Panel: No results for input(s): CHOL, TRIG, HDL, CHOLHDL, VLDL, LDLCALC in the last 168 hours.  CBG:  Recent Labs Lab 01/30/14 1547 01/30/14 1941 01/31/14 0010 01/31/14 0349 01/31/14 0754  GLUCAP 153* 150* 127* 60 102*    Microbiology: Results for orders placed or performed during the hospital encounter of 01/26/14  Urine culture     Status: None   Collection Time: 01/26/14  1:23 PM  Result Value Ref Range Status   Specimen Description URINE, CLEAN CATCH  Final   Special Requests NONE  Final   Culture  Setup Time   Final    01/26/2014 21:50 Performed at [place]   Final    >=100,000  COLONIES/ML Performed at [place] Performed at [facility]    Report Status 01/28/2014 FINAL  Final   Organism ID, Bacteria CITROBACTER FREUNDII  Final      Susceptibility   Citrobacter freundii - MIC*    CEFAZOLIN >=64 RESISTANT Resistant     CEFTRIAXONE <=1 SENSITIVE Sensitive     CIPROFLOXACIN 1 SENSITIVE Sensitive     GENTAMICIN <=1 SENSITIVE Sensitive     LEVOFLOXACIN 1 SENSITIVE Sensitive     NITROFURANTOIN <=16 SENSITIVE Sensitive     TOBRAMYCIN <=1 SENSITIVE Sensitive     TRIMETH/SULFA <=20 SENSITIVE Sensitive     PIP/TAZO <=4 SENSITIVE Sensitive     * CITROBACTER FREUNDII    Coagulation Studies: No results for input(s): LABPROT, INR in the last 72 hours.  Imaging: Ct Abdomen Pelvis Wo Contrast  01/31/2014   CLINICAL DATA:  Acute onset of altered mental status. Initial encounter.  EXAM: CT ABDOMEN AND PELVIS WITHOUT CONTRAST  TECHNIQUE: Multidetector CT imaging of the abdomen and pelvis was performed following the standard protocol without IV contrast.  COMPARISON:  CT of the abdomen and pelvis performed 04/08/2013, and renal ultrasound performed 05/10/2013  FINDINGS: Minimal bibasilar atelectasis or scarring is noted. Postoperative change is seen about the gastroesophageal junction. Relatively diffuse coronary artery calcifications are seen. A borderline prominent right paratracheal node is seen, measuring 1.1 cm in short axis.  A few scattered calcifications are seen within the liver, likely posttraumatic in nature; the liver and spleen are otherwise unremarkable. The patient is status post cholecystectomy, with clips noted along the gallbladder fossa. The pancreas and adrenal glands are unremarkable.  There is stable slight prominence of the right renal collecting system, without evidence of hydronephrosis. The left renal collecting system is unremarkable. Nonspecific perinephric stranding is noted bilaterally.  Scattered tiny nonobstructing bilateral renal stones are seen, measuring up to 3 mm in size.  There is a new tiny calcification noted along the right side of the posterior base of the bladder. This could reflect a recently passed stone, or possibly a small vascular calcification. No obstructing ureteral stones are seen. A vague focus of increased attenuation at the interpole region of the left kidney is thought to reflect minimal calcification.  No free fluid is identified. The small bowel is unremarkable in appearance. The stomach is within normal limits. No acute vascular  abnormalities are seen.  The patient is status post appendectomy. The colon is partially filled with stool. Minimal diverticulosis is noted along the distal descending and proximal sigmoid colon, without evidence of diverticulitis.  The bladder is mildly distended but otherwise grossly unremarkable. The prostate remains significantly enlarged, measuring 6.6 cm in transverse dimension, with nodular extension into the base of the bladder. No inguinal lymphadenopathy is seen.  No acute osseous abnormalities are identified. Multilevel vacuum phenomenon is noted at the lower lumbar spine.  IMPRESSION: 1. No evidence of hydronephrosis. No obstructing ureteral stone seen. 2. New tiny calcification along the right side of the posterior base of the bladder. This could reflect a recently passed stone, or possibly a small vascular calcification. 3. Stable slight prominence of the right renal collecting system, unchanged from the prior study. Scattered tiny nonobstructing bilateral renal stones, measuring up to 3 mm in size. 4. Borderline prominent right paratracheal node, measuring 1.1 cm in short axis, of uncertain significance. 5. Relatively diffuse coronary artery calcifications seen. 6. Minimal diverticulosis along the distal descending and proximal sigmoid colon, without evidence of diverticulitis. 7. Significantly enlarged prostate again noted, with  nodular extension into the base of the bladder. Would correlate with PSA.   Electronically Signed   By: Garald Balding M.D.   On: 01/31/2014 01:35    Medications:  Scheduled: . amLODipine  10 mg Oral q morning - 10a  . antiseptic oral rinse  7 mL Mouth Rinse BID  . aspirin EC  81 mg Oral q morning - 10a  . cefTRIAXone (ROCEPHIN) IVPB 1 gram/50 mL D5W  1 g Intravenous Q24H  . clonazePAM  0.75 mg Oral TID  . docusate sodium  100 mg Oral BID  . enoxaparin (LOVENOX) injection  50 mg Subcutaneous QHS  . hydrALAZINE  50 mg Oral TID  . insulin aspart  0-9 Units Subcutaneous 6 times per day  . insulin glargine  10 Units Subcutaneous QHS  . metoprolol  50 mg Oral BID  . pravastatin  40 mg Oral QHS  . sodium chloride  3 mL Intravenous Q12H  . tamsulosin  0.4 mg Oral QPC supper  . 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 Etiology is unclear. Previous workup with MRI and long-term EEG recording was unremarkable. Myoclonus improved with no new episodes over night.  Tolerating Depakote well.   Recommend no changes in current management with continuing clonazepam and Depakote for control of myoclonus.   Etta Quill PA-C [facility] [phone]  01/31/2014, 10:07 AM

## 2014-01-31 NOTE — Progress Notes (Signed)
Name: MATIN MATTIOLI MRN: [id number] DOB: [birth year]  ELECTRONIC ICU PHYSICIAN NOTE  Problem:  Acute pruritis x 30 min - no obvious drug cause  Intervention:  Benadryl 25 mg IV now and q4 h prn   Christinia Gully 01/31/2014, 9:15 PM

## 2014-02-01 DIAGNOSIS — R451 Restlessness and agitation: Secondary | ICD-10-CM

## 2014-02-01 LAB — GLUCOSE, CAPILLARY
GLUCOSE-CAPILLARY: 107 mg/dL — AB (ref 70–99)
GLUCOSE-CAPILLARY: 136 mg/dL — AB (ref 70–99)
Glucose-Capillary: 115 mg/dL — ABNORMAL HIGH (ref 70–99)
Glucose-Capillary: 158 mg/dL — ABNORMAL HIGH (ref 70–99)
Glucose-Capillary: 169 mg/dL — ABNORMAL HIGH (ref 70–99)
Glucose-Capillary: 187 mg/dL — ABNORMAL HIGH (ref 70–99)

## 2014-02-01 LAB — PSA: PSA: 2.75 ng/mL (ref <=4.00)

## 2014-02-01 NOTE — Progress Notes (Signed)
Patient ID: [name], male   DOB: 27-Apr-1934, 79 y.o.   MRN: [id number]  [facility] PROGRESS NOTE  [name] OFB:[id number] DOB: 08-22-1934 DOA: 01/28/2014 PCP: [name], MD   Brief narrative: Pt is 79 y.o. Male with HTN, HLD, DM, CAD, MI, recently diagnosed with myoclonus at [facility], discharged from [facility] 01/27/2014 and readmitted due to recurrence of uncontrolled myoclonic activity.   Assessment and Plan:    Active Problems:  Myoclonus - recently started on Keppra and Clonazepam but Keppra has been discontinued per neurology team - pt started on Depakote 500 mg BID and Clonazepam, both meds to be continued  - PT evaluation once pt able to participate  - no episodes of myoclonus per nursing report   Hypokalemia - supplemented and WNL this AM  Essential hypertension - reasonable inpatient control  - continue Norvasc, Metoprolol, Hydralazine   Urinary tract infection - urine culture 12/17 with citrobacter sp. - continue Rocephin  - pt's daughter asked to see urologist to discuss recurrent UTI as she believes this is contributing to worsening myoclonus - pt currently denies any urinary concerns - d/w Dr. Alinda Money, pt to follow up in an outpatient setting   Prostate enlargement  - seen on CT abd and pelvis - pt has known BPH per family - PSA is WNL  Chronic combined systolic and diastolic congestive heart failure - 2 D ECHO 01/27/2014 with EF 45 - 50% and grade I diastolic dysfunction  - clinically stable and euvolemic this AM - monitor daily weights, weight on admission: 223 lbs --> 225 lbs this AM  DM type 2 causing renal disease - reasonable inpatient control - continue Lantus and SSI   Encephalopathy, metabolic - secondary to UTI - now at baseline mental state   DVT prophylaxis  Lovenox SQ while pt is in hospital  Code Status: Full Family Communication: Pt, sister, and daughter at bedside Disposition Plan:  Transfer to med unit   IV Access:    Peripheral IV Procedures and diagnostic studies:    Ct Head Wo Contrast 01/28/2014 No acute intracranial pathology seen on CT. 2. Mild cortical volume loss noted. 3. Trace fluid again noted within the right mastoid air cells.   Mr Cervical Spine Wo Contrast 01/27/2014 1. 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 2. Multifactorial moderate or severe neural foraminal stenosis at the bilateral C4, right C5, and bilateral C6 nerve levels. 3. Posterior element ankylosis at C4-C5 with severe facet hypertrophy on the right. Bulky endplate osteophytes without ankylosis at C5-C6. Bulky endplate osteophytes with ankylosis from C6 to the upper thoracic spine. 4. Mild multifactorial upper thoracic spinal stenosis at T2-T3.  Ct Abdomen Pelvis Wo Contrast 01/31/2014 No evidence of hydronephrosis. No obstructing ureteral stone seen. 2. New tiny calcification along the right side of the posterior base of the bladder. This could reflect a recently passed stone, or possibly a small vascular calcification. 3. Stable slight prominence of the right renal collecting system, unchanged from the prior study. Scattered tiny nonobstructing bilateral renal stones, measuring up to 3 mm in size. 4. Borderline prominent right paratracheal node, measuring 1.1 cm in short axis, of uncertain significance. 5. Relatively diffuse coronary artery calcifications seen. 6. Minimal diverticulosis along the distal descending and proximal sigmoid colon, without evidence of diverticulitis. 7. Significantly enlarged prostate again noted, with nodular extension into the base of the bladder. Would  correlate with PSA.  Medical Consultants:    Neurology  Other Consultants:    Physical therapy  Anti-Infectives:    Rocephin 12/19  -->   [name], MD  [facility] Pager [phone]  If 7PM-7AM, please contact night-coverage [email or web address] Password [facility] 02/01/2014, 2:34 PM   LOS: 4 days   HPI/Subjective: No events overnight.   Objective: Filed Vitals:   02/01/14 0700 02/01/14 0800 02/01/14 0900 02/01/14 0948  BP: 135/48 146/60 140/61 152/78  Pulse: 63 67 69 83  Temp:  97.7 F (36.5 C)    TempSrc:  Oral    Resp: 16 20 19 16   Height:      Weight:      SpO2: 94% 91% 93% 95%    Intake/Output Summary (Last 24 hours) at 02/01/14 1434 Last data filed at 02/01/14 1400  Gross per 24 hour  Intake    640 ml  Output   1825 ml  Net  -1185 ml    Exam:   General:  Pt is alert, follows commands appropriately, not in acute distress  Cardiovascular: Regular rate and rhythm, S1/S2, no rubs, no gallops  Respiratory: Clear to auscultation bilaterally, no wheezing, no crackles, no rhonchi  Abdomen: Soft, non tender, non distended, bowel sounds present, no guarding  Data Reviewed: Basic Metabolic Panel:  Recent Labs Lab 01/27/14 0512 01/28/14 0731 01/28/14 1938 01/28/14 2231 01/29/14 0340 01/30/14 0336 01/31/14 0355  NA 143  --  142  --  143 141 143  K 3.2* 3.6* 3.7  --  3.5* 4.1 3.5  CL 104  --  104  --  106 104 108  CO2 26  --  24  --  23 25 26   GLUCOSE 108*  --  132*  --  122* 109* 92  BUN 16  --  16  --  17 19 18   CREATININE 1.08  --  1.16 1.11 1.13 1.24 1.01  CALCIUM 9.1  --  9.7  --  9.2 9.2 9.0  MG  --   --   --   --  1.9  --   --   PHOS  --   --   --   --  3.8  --   --    Liver Function Tests:  Recent Labs Lab 01/26/14 1145 01/27/14 0512 01/29/14 0340  AST 19 16 17   ALT 30 27 27   ALKPHOS 62 56 56  BILITOT 0.4 0.3 0.3  PROT 6.7 6.1 6.1  ALBUMIN 3.7 3.4* 3.4*   CBC:  Recent Labs Lab 01/26/14 1145  01/28/14 1938 01/28/14 2231 01/29/14 0340 01/30/14 0336 01/31/14 0355  WBC 5.8  < > 7.0 6.5 5.5 5.4 4.7  NEUTROABS 3.0  --  4.2  --   --   --   --   HGB 14.8  < > 15.1  14.9 14.4 14.0 14.2  HCT 44.2  < > 44.1 43.8 43.4 43.3 43.6  MCV 95.9  < > 95.5 95.4 98.2 97.3 96.5  PLT 317  < > 299 275 226 278 290  < > = values in this interval not displayed. Cardiac Enzymes:  Recent Labs Lab 01/27/14 0512 01/28/14 2234 01/29/14 0339 01/29/14 1012 01/29/14 1755  TROPONINI <0.30 <0.30 <0.30 <0.30 <0.30   CBG:  Recent Labs Lab 01/31/14 1927 01/31/14 2338 02/01/14 0306 02/01/14 0801 02/01/14 1155  GLUCAP 140* 136* 107* 115* 158*    Recent Results (from the past 240 hour(s))  Urine culture     Status: None  Collection Time: 01/26/14  1:23 PM  Result Value Ref Range Status   Specimen Description URINE, CLEAN CATCH  Final   Special Requests NONE  Final   Culture  Setup Time   Final    01/26/2014 21:50 Performed at [place]   Final    >=100,000 COLONIES/ML Performed at [place] Performed at [facility]    Report Status 01/28/2014 FINAL  Final   Organism ID, Bacteria CITROBACTER FREUNDII  Final      Susceptibility   Citrobacter freundii - MIC*    CEFAZOLIN >=64 RESISTANT Resistant     CEFTRIAXONE <=1 SENSITIVE Sensitive     CIPROFLOXACIN 1 SENSITIVE Sensitive     GENTAMICIN <=1 SENSITIVE Sensitive     LEVOFLOXACIN 1 SENSITIVE Sensitive     NITROFURANTOIN <=16 SENSITIVE Sensitive     TOBRAMYCIN <=1 SENSITIVE Sensitive     TRIMETH/SULFA <=20 SENSITIVE Sensitive     PIP/TAZO <=4 SENSITIVE Sensitive     * CITROBACTER FREUNDII     Scheduled Meds: . amLODipine  10 mg Oral q morning - 10a  . antiseptic oral rinse  7 mL Mouth Rinse BID  . aspirin EC  81 mg Oral q morning - 10a  . cefTRIAXone (ROCEPHIN) IVPB 1 gram/50 mL D5W  1 g Intravenous Q24H  . clonazePAM  0.75 mg Oral TID  . docusate sodium  100 mg Oral BID  . enoxaparin (LOVENOX) injection  50 mg Subcutaneous QHS  . hydrALAZINE  50 mg Oral TID  . insulin aspart  0-9 Units Subcutaneous 6 times  per day  . insulin glargine  10 Units Subcutaneous QHS  . metoprolol  50 mg Oral BID  . pravastatin  40 mg Oral QHS  . sodium chloride  3 mL Intravenous Q12H  . tamsulosin  0.4 mg Oral QPC supper  . valproate sodium  500 mg Intravenous Q12H   Continuous Infusions: . sodium chloride 10 mL/hr at 01/31/14 1711

## 2014-02-01 NOTE — Progress Notes (Signed)
Subjective: Patient had no complaints. No recurrence of myoclonic movements reported overnight, none so far today.  Objective: Current vital signs: BP 152/78 mmHg  Pulse 83  Temp(Src) 98.9 F (37.2 C) (Oral)  Resp 16  Ht 6' (1.829 m)  Wt 102.3 kg (225 lb 8.5 oz)  BMI 30.58 kg/m2  SpO2 95%  Neurologic Exam: Patient was alert and in no acute distress. He was well-oriented to time as well as place. Extraocular movements were full and conjugate. Speech was normal. No facial weakness was noted. Strength and muscle tone was normal throughout. Patient had no abnormal movement. There was also normal abnormal posturing. Finger to nose testing was normal bilaterally.  Medications: I have reviewed the patient's current medications.  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 His mental status has improved as well.  Recommend no changes in current management. I will see him in follow-up on an as-needed basis following this visit.  C.R. Nicole Kindred, MD [facility] [phone]  02/01/2014  10:26 AM

## 2014-02-01 NOTE — Progress Notes (Signed)
Despite protests and education from nursing about patient getting up and over exterting self, patient and family decided patient should be able to get up and move around room. Nurse reinforced to patient and family the importance of patient getting rest and keeping stimulus in room to minimum because over exertion and over stimulus seem to be triggering episodes of shaking. Patient stated he was extremely fatigued after a trip to bedside commode. Patient placed in chair. Pt began to have an episode of alternating strong to weak tremors to all extremities lasting approximately 5 minutes. Pt able to follow commands and answer simple questions during entire episode. VSS during and after episode. MD notified.

## 2014-02-01 NOTE — Progress Notes (Signed)
Clinical Social Work Department BRIEF PSYCHOSOCIAL ASSESSMENT 02/01/2014  Patient:  Mathews Mathews     Account Number:  [id number]     Admit date:  01/28/2014  Clinical Social Worker:  Maryln Manuel  Date/Time:  02/01/2014 10:45 AM  Referred by:  Physician  Date Referred:  02/01/2014 Referred for  SNF Placement   Other Referral:   Interview type:  Family Other interview type:    PSYCHOSOCIAL DATA Living Status:  FACILITY Admitted from facility:  [place] [place] Level of care:  [place] Primary support name:  Mathews Mathews/daugher/[phone] Primary support relationship to patient:  CHILD, ADULT Degree of support available:   strong    CURRENT CONCERNS Current Concerns  Post-Acute Placement   Other Concerns:    SOCIAL WORK ASSESSMENT / PLAN CSW received referral that pt admitted from [facility].    CSW visited pt room. Pt sleeping soundly at this time. No family present at bedside. CSW contacted pt daughter, Mathews Mathews via telephone. CSW introduced self and explained role. Pt daughter discussed that pt has been at short term rehab at [facility] provided supportive listening as pt daughter discussed that pt d/c from hospital last weekend and was at [facility] for 4 hours before returning to the hospital as [facility] MD did not feel that pt was stable. Pt daughter discussed that depending on pt progress the plan will be to return to [facility] for continued rehab vs. returning home. CSW discussed with pt daughter that at this time PT/OT are recommending continued rehab. Pt daughter expressed understanding. CSW discussed that CSW will continue to follow to assist with pt disposition needs when pt medically stable from the hospital.    CSW completed FL2 and sent clinicals to [facility]. CSW contacted facility and spoke with admissions coordinator. [facility] admissions stated that they can accept pt back, but MD at SNF will want to review clinicals  before pt return. CSW discussed that pt currently in SDU, but will transition to floor today. Per [facility] admissions, pt daughter reported to facility that pt will not be ready for discharge until early next week.    CSW to continue to follow to provide support and assist with pt disposition needs.   Assessment/plan status:  Psychosocial Support/Ongoing Assessment of Needs Other assessment/ plan:   discharge planning   Information/referral to community resources:   Referral back to [facility]    PATIENT'S/FAMILY'S RESPONSE TO PLAN OF CARE: Pt sleeping soundly at this time and per chart, pt oriented to person only. Pt daughter supportive and actively invovled in pt care. Pt daughter has been keeping [facility] updated on pt progress and hopeful that pt may be able to return home, but recogonizes the liklihood that pt will need to return to [facility] for continued rehab.   Mathews Mathews, MSW, [place] Work [phone]

## 2014-02-02 LAB — CBC
HCT: 43.2 % (ref 39.0–52.0)
Hemoglobin: 14.4 g/dL (ref 13.0–17.0)
MCH: 32 pg (ref 26.0–34.0)
MCHC: 33.3 g/dL (ref 30.0–36.0)
MCV: 96 fL (ref 78.0–100.0)
Platelets: 252 10*3/uL (ref 150–400)
RBC: 4.5 MIL/uL (ref 4.22–5.81)
RDW: 13.6 % (ref 11.5–15.5)
WBC: 5.3 10*3/uL (ref 4.0–10.5)

## 2014-02-02 LAB — BASIC METABOLIC PANEL
ANION GAP: 4 — AB (ref 5–15)
BUN: 20 mg/dL (ref 6–23)
CALCIUM: 9 mg/dL (ref 8.4–10.5)
CO2: 28 mmol/L (ref 19–32)
CREATININE: 1.09 mg/dL (ref 0.50–1.35)
Chloride: 108 mEq/L (ref 96–112)
GFR calc Af Amer: 74 mL/min — ABNORMAL LOW (ref >90)
GFR calc non Af Amer: 63 mL/min — ABNORMAL LOW (ref >90)
Glucose, Bld: 123 mg/dL — ABNORMAL HIGH (ref 70–99)
Potassium: 3.5 mmol/L (ref 3.5–5.1)
Sodium: 140 mmol/L (ref 135–145)

## 2014-02-02 LAB — GLUCOSE, CAPILLARY
GLUCOSE-CAPILLARY: 120 mg/dL — AB (ref 70–99)
GLUCOSE-CAPILLARY: 156 mg/dL — AB (ref 70–99)
Glucose-Capillary: 110 mg/dL — ABNORMAL HIGH (ref 70–99)
Glucose-Capillary: 116 mg/dL — ABNORMAL HIGH (ref 70–99)
Glucose-Capillary: 161 mg/dL — ABNORMAL HIGH (ref 70–99)
Glucose-Capillary: 198 mg/dL — ABNORMAL HIGH (ref 70–99)

## 2014-02-02 LAB — URINALYSIS, ROUTINE W REFLEX MICROSCOPIC
Bilirubin Urine: NEGATIVE
GLUCOSE, UA: NEGATIVE mg/dL
Hgb urine dipstick: NEGATIVE
Ketones, ur: NEGATIVE mg/dL
LEUKOCYTES UA: NEGATIVE
Nitrite: NEGATIVE
PROTEIN: NEGATIVE mg/dL
Specific Gravity, Urine: 1.022 (ref 1.005–1.030)
UROBILINOGEN UA: 0.2 mg/dL (ref 0.0–1.0)
pH: 6 (ref 5.0–8.0)

## 2014-02-02 MED ORDER — VALPROIC ACID 250 MG PO CAPS
500.0000 mg | ORAL_CAPSULE | Freq: Two times a day (BID) | ORAL | Status: DC
  Administered 2014-02-02 – 2014-02-04 (×5): 500 mg via ORAL
  Filled 2014-02-02 (×6): qty 2

## 2014-02-02 NOTE — Progress Notes (Signed)
Clinical Social Work  Patient discussed during progression meeting and MD reports possible weekend DC. CSW spoke with Nira Conn at [facility] who reports that she staffed case with Dr. Inda Merlin who is agreeable to accept patient back when medically stable. [facility] can accept patient back over the weekend if needed but would need DC summary by 10 am in order to get prescriptions filled. Weekend CSW can Biomedical scientist at [phone] at [place] if patient is ready to DC. Signed FL2 on chart and hand off left for weekend CSW.  [place], [place] [phone]

## 2014-02-02 NOTE — Progress Notes (Signed)
Patient ID: [name], male   DOB: 05-29-1934, 79 y.o.   MRN: [id number]  [facility] PROGRESS NOTE  S[place] [name]:[id number] DOB: Mar 28, 1934 DOA: 01/28/2014 PCP: [name], MD  Brief narrative: Pt is 79 y.o. Male with HTN, HLD, DM, CAD, MI, recently diagnosed with myoclonus at [facility], discharged from [facility] 01/27/2014 and readmitted due to recurrence of uncontrolled myoclonic activity.   Assessment and Plan:    Active Problems:  Myoclonus - recently started on Keppra and Clonazepam but Keppra has been discontinued per neurology team - pt started on Depakote 500 mg BID and Clonazepam, both meds to be continued  - PT evaluation requested and pt in hallway this AM with PT, ambulating  - no episodes of myoclonus per nursing report over the past 24 hours   Hypokalemia - supplemented and WNL this AM  Essential hypertension - reasonable inpatient control  - continue Norvasc, Metoprolol, Hydralazine   Urinary tract infection - urine culture 12/17 with citrobacter sp. - continue Rocephin, repeat UA and urine culture pending  - pt's daughter asked to see urologist to discuss recurrent UTI as she believes this is contributing to worsening myoclonus - pt currently denies any urinary concerns - d/w Dr. Alinda Money, pt to follow up in an outpatient setting   Prostate enlargement  - seen on CT abd and pelvis - pt has known BPH per family - PSA is WNL  Chronic combined systolic and diastolic congestive heart failure - 2 D ECHO 01/27/2014 with EF 45 - 50% and grade I diastolic dysfunction  - clinically stable and euvolemic this AM - monitor daily weights, weight on admission: 223 lbs --> 220 lbs this AM  DM type 2 causing renal disease - reasonable inpatient control - continue Lantus and SSI   Encephalopathy, metabolic - secondary to UTI - now at baseline mental state   DVT prophylaxis  Lovenox SQ while pt is in hospital  Code  Status: Full Family Communication: Pt, sister, and daughter at bedside Disposition Plan: Plan d/c to SNF in 1-2 days  IV Access:    Peripheral IV Procedures and diagnostic studies:    Ct Head Wo Contrast 01/28/2014 No acute intracranial pathology seen on CT. 2. Mild cortical volume loss noted. 3. Trace fluid again noted within the right mastoid air cells.   Mr Cervical Spine Wo Contrast 01/27/2014 1. 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 2. Multifactorial moderate or severe neural foraminal stenosis at the bilateral C4, right C5, and bilateral C6 nerve levels. 3. Posterior element ankylosis at C4-C5 with severe facet hypertrophy on the right. Bulky endplate osteophytes without ankylosis at C5-C6. Bulky endplate osteophytes with ankylosis from C6 to the upper thoracic spine. 4. Mild multifactorial upper thoracic spinal stenosis at T2-T3.  Ct Abdomen Pelvis Wo Contrast 01/31/2014 No evidence of hydronephrosis. No obstructing ureteral stone seen. 2. New tiny calcification along the right side of the posterior base of the bladder. This could reflect a recently passed stone, or possibly a small vascular calcification. 3. Stable slight prominence of the right renal collecting system, unchanged from the prior study. Scattered tiny nonobstructing bilateral renal stones, measuring up to 3 mm in size. 4. Borderline prominent right paratracheal node, measuring 1.1 cm in short axis, of uncertain significance. 5. Relatively diffuse coronary artery calcifications seen. 6. Minimal diverticulosis along the distal descending and proximal sigmoid colon, without evidence of  diverticulitis. 7. Significantly enlarged prostate again noted, with nodular extension into the base of the bladder. Would correlate with PSA.  Medical Consultants:    Neurology  Other  Consultants:    Physical therapy  Anti-Infectives:    Rocephin 12/19 --> 12/25  [name], MD  [facility] Pager [phone]  If 7PM-7AM, please contact night-coverage [email or web address] Password [email or web address] 02/02/2014, 12:23 PM   LOS: 5 days   HPI/Subjective: No events overnight.   Objective: Filed Vitals:   02/01/14 1600 02/01/14 1806 02/01/14 2001 02/02/14 0410  BP: 157/60 162/70 151/78 113/54  Pulse: 72 60 81 65  Temp:  97.6 F (36.4 C) 97.9 F (36.6 C) 98.2 F (36.8 C)  TempSrc:  Oral Oral Oral  Resp: 13 18 20 18   Height:  6' (1.829 m)    Weight:  100.064 kg (220 lb 9.6 oz)    SpO2: 100% 97% 93% 90%    Intake/Output Summary (Last 24 hours) at 02/02/14 1223 Last data filed at 02/02/14 1003  Gross per 24 hour  Intake    465 ml  Output    600 ml  Net   -135 ml    Exam:   General:  Pt is alert, follows commands appropriately, not in acute distress  Cardiovascular: Regular rate and rhythm, no rubs, no gallops  Respiratory: Clear to auscultation bilaterally, no wheezing, no crackles, no rhonchi  Abdomen: Soft, non tender, non distended, bowel sounds present, no guarding  Extremities: No edema, pulses DP and PT palpable bilaterally  Data Reviewed: Basic Metabolic Panel:  Recent Labs Lab 01/28/14 1938 01/28/14 2231 01/29/14 0340 01/30/14 0336 01/31/14 0355 02/02/14 0454  NA 142  --  143 141 143 140  K 3.7  --  3.5* 4.1 3.5 3.5  CL 104  --  106 104 108 108  CO2 24  --  23 25 26 28   GLUCOSE 132*  --  122* 109* 92 123*  BUN 16  --  17 19 18 20   CREATININE 1.16 1.11 1.13 1.24 1.01 1.09  CALCIUM 9.7  --  9.2 9.2 9.0 9.0  MG  --   --  1.9  --   --   --   PHOS  --   --  3.8  --   --   --    Liver Function Tests:  Recent Labs Lab 01/27/14 0512 01/29/14 0340  AST 16 17  ALT 27 27  ALKPHOS 56 56  BILITOT 0.3 0.3  PROT 6.1 6.1  ALBUMIN 3.4* 3.4*   CBC:  Recent Labs Lab 01/28/14 1938 01/28/14 2231 01/29/14 0340 01/30/14 0336  01/31/14 0355 02/02/14 0454  WBC 7.0 6.5 5.5 5.4 4.7 5.3  NEUTROABS 4.2  --   --   --   --   --   HGB 15.1 14.9 14.4 14.0 14.2 14.4  HCT 44.1 43.8 43.4 43.3 43.6 43.2  MCV 95.5 95.4 98.2 97.3 96.5 96.0  PLT 299 275 226 278 290 252   Cardiac Enzymes:  Recent Labs Lab 01/27/14 0512 01/28/14 2234 01/29/14 0339 01/29/14 1012 01/29/14 1755  TROPONINI <0.30 <0.30 <0.30 <0.30 <0.30   BNP: Invalid input(s): POCBNP CBG:  Recent Labs Lab 02/01/14 1957 02/02/14 0007 02/02/14 0400 02/02/14 0824 02/02/14 1210  GLUCAP 187* 110* 120* 116* 156*    Recent Results (from the past 240 hour(s))  Urine culture     Status: None   Collection Time: 01/26/14  1:23 PM  Result Value Ref Range Status   Specimen Description  URINE, CLEAN CATCH  Final   Special Requests NONE  Final   Culture  Setup Time   Final    01/26/2014 21:50 Performed at [place]   Final    >=100,000 COLONIES/ML Performed at [place] Performed at [facility]    Report Status 01/28/2014 FINAL  Final   Organism ID, Bacteria CITROBACTER FREUNDII  Final      Susceptibility   Citrobacter freundii - MIC*    CEFAZOLIN >=64 RESISTANT Resistant     CEFTRIAXONE <=1 SENSITIVE Sensitive     CIPROFLOXACIN 1 SENSITIVE Sensitive     GENTAMICIN <=1 SENSITIVE Sensitive     LEVOFLOXACIN 1 SENSITIVE Sensitive     NITROFURANTOIN <=16 SENSITIVE Sensitive     TOBRAMYCIN <=1 SENSITIVE Sensitive     TRIMETH/SULFA <=20 SENSITIVE Sensitive     PIP/TAZO <=4 SENSITIVE Sensitive     * CITROBACTER FREUNDII     Scheduled Meds: . amLODipine  10 mg Oral q morning - 10a  . antiseptic oral rinse  7 mL Mouth Rinse BID  . aspirin EC  81 mg Oral q morning - 10a  . cefTRIAXone (ROCEPHIN) IVPB 1 gram/50 mL D5W  1 g Intravenous Q24H  . clonazePAM  0.75 mg Oral TID  . docusate sodium  100 mg Oral BID  . enoxaparin (LOVENOX) injection  50 mg  Subcutaneous QHS  . hydrALAZINE  50 mg Oral TID  . insulin aspart  0-9 Units Subcutaneous 6 times per day  . insulin glargine  10 Units Subcutaneous QHS  . metoprolol  50 mg Oral BID  . pravastatin  40 mg Oral QHS  . sodium chloride  3 mL Intravenous Q12H  . tamsulosin  0.4 mg Oral QPC supper  . valproate sodium  500 mg Intravenous Q12H   Continuous Infusions: . sodium chloride 10 mL/hr at 01/31/14 1711

## 2014-02-02 NOTE — Progress Notes (Signed)
Physical Therapy Treatment Patient Details Name: GIACOMO VALONE MRN: [id number] DOB: April 24, 1934 Today's Date: 02/02/2014    History of Present Illness Pt was admitted from [facility] for agitation and tremor.  He was dx'd with myoclonus at [place] has a h/o CAD, syncope, HTN and MI    PT Comments    Assisted pt with amb + 2 assist with recliner following closely behind due to intermittent uncontrolled tremors. Myoclonus.  Pt tolerated amb a great distance.    Follow Up Recommendations  SNF     Equipment Recommendations  None recommended by PT    Recommendations for Other Services       Precautions / Restrictions Precautions Precautions: Fall Restrictions Weight Bearing Restrictions: No    Mobility  Bed Mobility Overal bed mobility: Needs Assistance Bed Mobility: Supine to Sit     Supine to sit: Mod assist;Min assist     General bed mobility comments: increased time and use of rail  Transfers Overall transfer level: Needs assistance Equipment used: Rolling walker (2 wheeled) Transfers: Sit to/from Stand Sit to Stand: +2 safety/equipment;Min assist;Mod assist         General transfer comment: 50% VC's on proper tech and controlled decend  Ambulation/Gait Ambulation/Gait assistance: Min assist;Mod assist;+2 safety/equipment Ambulation Distance (Feet): 115 Feet (2 sitting rest break) Assistive device: Rolling walker (2 wheeled) Gait Pattern/deviations: Step-to pattern;Trunk flexed Gait velocity: decreased   General Gait Details: cues for pace, posture and position from RW. Several short standing rests required to complete task.  Intermittent tremors.  Recliner following closely behind.   Stairs            Wheelchair Mobility    Modified Rankin (Stroke Patients Only)       Balance                                    Cognition                            Exercises      General Comments        Pertinent  Vitals/Pain      Home Living                      Prior Function            PT Goals (current goals can now be found in the care plan section) Progress towards PT goals: Progressing toward goals    Frequency  Min 3X/week    PT Plan      Co-evaluation             End of Session Equipment Utilized During Treatment: Gait belt Activity Tolerance: Patient limited by fatigue Patient left: in chair     Time: 1035-1100 PT Time Calculation (min) (ACUTE ONLY): 25 min  Charges:  $Gait Training: 8-22 mins $Therapeutic Activity: 8-22 mins                    G Codes:      Rica Koyanagi  PTA [facility]  Acute  Rehab Pager      [phone]

## 2014-02-03 LAB — GLUCOSE, CAPILLARY
Glucose-Capillary: 106 mg/dL — ABNORMAL HIGH (ref 70–99)
Glucose-Capillary: 115 mg/dL — ABNORMAL HIGH (ref 70–99)
Glucose-Capillary: 147 mg/dL — ABNORMAL HIGH (ref 70–99)
Glucose-Capillary: 157 mg/dL — ABNORMAL HIGH (ref 70–99)
Glucose-Capillary: 175 mg/dL — ABNORMAL HIGH (ref 70–99)
Glucose-Capillary: 187 mg/dL — ABNORMAL HIGH (ref 70–99)

## 2014-02-03 MED ORDER — CLONAZEPAM 0.5 MG PO TABS: 0.7500 mg | ORAL_TABLET | Freq: Three times a day (TID) | ORAL | Status: DC

## 2014-02-03 MED ORDER — VALPROIC ACID 250 MG PO CAPS: 500.0000 mg | ORAL_CAPSULE | Freq: Two times a day (BID) | ORAL | Status: DC

## 2014-02-03 NOTE — Discharge Summary (Signed)
Physician Discharge Summary  [name]:[id number] DOB: 04/15/1934 DOA: 01/28/2014  PCP: [name], MD  Admit date: 01/28/2014 Discharge date: 02/03/2014  Recommendations for Outpatient Follow-up:  1. Pt will need to follow up with PCP in 2-3 weeks post discharge 2. Please obtain BMP to evaluate electrolytes and kidney function 3. Please also check CBC to evaluate Hg and Hct levels 4. Please note Keppra stopped and started Depakote 5. Pt to follow up with urologist for eval of ? Recurrent UTI  Discharge Diagnoses:  Active Problems:   Essential hypertension   Urinary tract infection   Chronic combined systolic and diastolic congestive heart failure   Startle myoclonus   DM type 2 causing renal disease   Encephalopathy   Agitation    Discharge Condition: Stable  Diet recommendation: Heart healthy diet discussed in details    Brief narrative: Pt is 79 y.o. Male with HTN, HLD, DM, CAD, MI, recently diagnosed with myoclonus at [facility], discharged from [facility] 01/27/2014 and readmitted due to recurrence of uncontrolled myoclonic activity.   Assessment and Plan:    Active Problems:  Myoclonus - recently started on Keppra and Clonazepam but Keppra has been discontinued per neurology team - pt started on Depakote 500 mg BID and Clonazepam, both meds to be continued  - PT evaluation requested and pt in hallway this AM with PT, ambulating  - no episodes of myoclonus per nursing report over the past 48 hours  - plan d/c SNF In AM  Hypokalemia - supplemented and WNL this AM  Essential hypertension - reasonable inpatient control  - continue Norvasc, Metoprolol, Hydralazine   Urinary tract infection - urine culture 12/17 with citrobacter sp. - continue Rocephin, repeat UA and urine culture pending  - pt's daughter asked to see urologist to discuss recurrent UTI as she believes this is contributing to worsening myoclonus - pt currently  denies any urinary concerns - d/w Dr. Alinda Money, pt to follow up in an outpatient setting   Prostate enlargement  - seen on CT abd and pelvis - pt has known BPH per family - PSA is WNL  Chronic combined systolic and diastolic congestive heart failure - 2 D ECHO 01/27/2014 with EF 45 - 50% and grade I diastolic dysfunction  - clinically stable and euvolemic this AM - monitor daily weights, weight on admission: 223 lbs --> 220 lbs this AM  DM type 2 causing renal disease - reasonable inpatient control - continue Lantus and SSI   Encephalopathy, metabolic - secondary to UTI - now at baseline mental state   DVT prophylaxis  Lovenox SQ while pt is in hospital  Code Status: Full Family Communication: Pt, sister, and daughter at bedside Disposition Plan: Plan d/c to SNF in am  IV Access:    Peripheral IV Procedures and diagnostic studies:    Ct Head Wo Contrast 01/28/2014 No acute intracranial pathology seen on CT. 2. Mild cortical volume loss noted. 3. Trace fluid again noted within the right mastoid air cells.   Mr Cervical Spine Wo Contrast 01/27/2014 1. 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 2. Multifactorial moderate or severe neural foraminal stenosis at the bilateral C4, right C5, and bilateral C6 nerve levels. 3. Posterior element ankylosis at C4-C5 with severe facet hypertrophy on the right. Bulky endplate osteophytes without ankylosis at C5-C6. Bulky endplate osteophytes with ankylosis from C6 to the upper thoracic spine.  4. Mild multifactorial upper thoracic spinal stenosis at T2-T3.  Ct Abdomen Pelvis Wo Contrast 01/31/2014 No evidence of hydronephrosis. No obstructing ureteral stone seen. 2. New tiny calcification along the right side of the posterior base of the bladder. This could reflect a recently passed stone,  or possibly a small vascular calcification. 3. Stable slight prominence of the right renal collecting system, unchanged from the prior study. Scattered tiny nonobstructing bilateral renal stones, measuring up to 3 mm in size. 4. Borderline prominent right paratracheal node, measuring 1.1 cm in short axis, of uncertain significance. 5. Relatively diffuse coronary artery calcifications seen. 6. Minimal diverticulosis along the distal descending and proximal sigmoid colon, without evidence of diverticulitis. 7. Significantly enlarged prostate again noted, with nodular extension into the base of the bladder. Would correlate with PSA.  Medical Consultants:    Neurology  Other Consultants:    Physical therapy  Anti-Infectives:    Rocephin 12/19 --> 12/26  Discharge Exam: Filed Vitals:   02/03/14 0500  BP: 148/72  Pulse: 68  Temp: 98.4 F (36.9 C)  Resp: 18   Filed Vitals:   02/02/14 0410 02/02/14 1449 02/02/14 2111 02/03/14 0500  BP: 113/54 149/73 152/77 148/72  Pulse: 65 74 82 68  Temp: 98.2 F (36.8 C) 98.4 F (36.9 C) 98.6 F (37 C) 98.4 F (36.9 C)  TempSrc: Oral Oral Oral Oral  Resp: 18 20 20 18   Height:      Weight:      SpO2: 90%  94% 95%    General: Pt is alert, follows commands appropriately, not in acute distress Cardiovascular: Regular rate and rhythm, no rubs, no gallops Respiratory: Clear to auscultation bilaterally, no wheezing, no crackles, no rhonchi Abdominal: Soft, non tender, non distended, bowel sounds +, no guarding Extremities: no edema, no cyanosis, pulses palpable bilaterally DP and PT Neuro: Grossly nonfocal  Discharge Instructions     Medication List    STOP taking these medications        levETIRAcetam 500 MG tablet  Commonly known as:  KEPPRA      TAKE these medications        acetaminophen 650 MG CR tablet  Commonly known as:  TYLENOL  Take 650 mg by mouth every 4 (four) hours as needed for pain or fever.      amLODipine 10 MG tablet  Commonly known as:  NORVASC  Take 1 tablet (10 mg total) by mouth every morning.     aspirin EC 81 MG tablet  Take 81 mg by mouth every morning.     clonazePAM 0.5 MG tablet  Commonly known as:  KLONOPIN  Take 1.5 tablets (0.75 mg total) by mouth 3 (three) times daily.     glimepiride 4 MG tablet  Commonly known as:  AMARYL  Take 1 tablet (4 mg total) by mouth daily with breakfast.     hydrALAZINE 50 MG tablet  Commonly known as:  APRESOLINE  Take 50 mg by mouth 3 (three) times daily.     insulin aspart 100 UNIT/ML injection  Commonly known as:  novoLOG  Inject 4-10 Units into the skin 3 (three) times daily before meals.     lisinopril-hydrochlorothiazide 20-25 MG per tablet  Commonly known as:  PRINZIDE,ZESTORETIC  Take 1 tablet by mouth every morning.     metFORMIN 1000 MG tablet  Commonly known as:  GLUCOPHAGE  Take 1 tablet (1,000 mg total) by mouth 2 (two) times daily with a meal.     metoprolol  50 MG tablet  Commonly known as:  LOPRESSOR  Take 1 tablet (50 mg total) by mouth 2 (two) times daily.     pravastatin 40 MG tablet  Commonly known as:  PRAVACHOL  Take 1 tablet (40 mg total) by mouth at bedtime.     tamsulosin 0.4 MG Caps capsule  Commonly known as:  FLOMAX  Take 0.4 mg by mouth daily after supper.     [name] SOLOSTAR 300 UNIT/ML Sopn  Generic drug:  Insulin Glargine  Inject 20 Units into the skin at bedtime.     valproic acid 250 MG capsule  Commonly known as:  DEPAKENE  Take 2 capsules (500 mg total) by mouth 2 (two) times daily.          The results of significant diagnostics from this hospitalization (including imaging, microbiology, ancillary and laboratory) are listed below for reference.     Microbiology: Recent Results (from the past 240 hour(s))  Urine culture     Status: None   Collection Time: 01/26/14  1:23 PM  Result Value Ref Range Status   Specimen Description URINE, CLEAN CATCH  Final   Special  Requests NONE  Final   Culture  Setup Time   Final    01/26/2014 21:50 Performed at [place]   Final    >=100,000 COLONIES/ML Performed at [facility]    Culture   Final    CITROBACTER FREUNDII Performed at [facility]    Report Status 01/28/2014 FINAL  Final   Organism ID, Bacteria CITROBACTER FREUNDII  Final      Susceptibility   Citrobacter freundii - MIC*    CEFAZOLIN >=64 RESISTANT Resistant     CEFTRIAXONE <=1 SENSITIVE Sensitive     CIPROFLOXACIN 1 SENSITIVE Sensitive     GENTAMICIN <=1 SENSITIVE Sensitive     LEVOFLOXACIN 1 SENSITIVE Sensitive     NITROFURANTOIN <=16 SENSITIVE Sensitive     TOBRAMYCIN <=1 SENSITIVE Sensitive     TRIMETH/SULFA <=20 SENSITIVE Sensitive     PIP/TAZO <=4 SENSITIVE Sensitive     * CITROBACTER FREUNDII     Labs: Basic Metabolic Panel:  Recent Labs Lab 01/28/14 1938 01/28/14 2231 01/29/14 0340 01/30/14 0336 01/31/14 0355 02/02/14 0454  NA 142  --  143 141 143 140  K 3.7  --  3.5* 4.1 3.5 3.5  CL 104  --  106 104 108 108  CO2 24  --  23 25 26 28   GLUCOSE 132*  --  122* 109* 92 123*  BUN 16  --  17 19 18 20   CREATININE 1.16 1.11 1.13 1.24 1.01 1.09  CALCIUM 9.7  --  9.2 9.2 9.0 9.0  MG  --   --  1.9  --   --   --   PHOS  --   --  3.8  --   --   --    Liver Function Tests:  Recent Labs Lab 01/29/14 0340  AST 17  ALT 27  ALKPHOS 56  BILITOT 0.3  PROT 6.1  ALBUMIN 3.4*   No results for input(s): LIPASE, AMYLASE in the last 168 hours. No results for input(s): AMMONIA in the last 168 hours. CBC:  Recent Labs Lab 01/28/14 1938 01/28/14 2231 01/29/14 0340 01/30/14 0336 01/31/14 0355 02/02/14 0454  WBC 7.0 6.5 5.5 5.4 4.7 5.3  NEUTROABS 4.2  --   --   --   --   --   HGB  15.1 14.9 14.4 14.0 14.2 14.4  HCT 44.1 43.8 43.4 43.3 43.6 43.2  MCV 95.5 95.4 98.2 97.3 96.5 96.0  PLT 299 275 226 278 290 252   Cardiac Enzymes:  Recent Labs Lab 01/28/14 2234 01/29/14 0339  01/29/14 1012 01/29/14 1755  TROPONINI <0.30 <0.30 <0.30 <0.30   BNP: BNP (last 3 results)  Recent Labs  01/27/14 0527  PROBNP 241.8   CBG:  Recent Labs Lab 02/02/14 1556 02/02/14 2013 02/03/14 0012 02/03/14 0406 02/03/14 0738  GLUCAP 198* 161* 175* 106* 115*     SIGNED: Time coordinating discharge: Over 30 minutes  [name], MD  [facility] 02/03/2014, 9:59 AM Pager [phone]  If 7PM-7AM, please contact night-coverage [email or web address] Password [email or web address]

## 2014-02-03 NOTE — Discharge Instructions (Signed)

## 2014-02-04 LAB — GLUCOSE, CAPILLARY
Glucose-Capillary: 119 mg/dL — ABNORMAL HIGH (ref 70–99)
Glucose-Capillary: 135 mg/dL — ABNORMAL HIGH (ref 70–99)
Glucose-Capillary: 189 mg/dL — ABNORMAL HIGH (ref 70–99)

## 2014-02-04 LAB — CREATININE, SERUM
Creatinine, Ser: 0.85 mg/dL (ref 0.50–1.35)
GFR, EST NON AFRICAN AMERICAN: 82 mL/min — AB (ref >90)

## 2014-02-04 LAB — CBC
HCT: 44.2 % (ref 39.0–52.0)
Hemoglobin: 14.7 g/dL (ref 13.0–17.0)
MCH: 32 pg (ref 26.0–34.0)
MCHC: 33.3 g/dL (ref 30.0–36.0)
MCV: 96.1 fL (ref 78.0–100.0)
Platelets: 262 10*3/uL (ref 150–400)
RBC: 4.6 MIL/uL (ref 4.22–5.81)
RDW: 13.5 % (ref 11.5–15.5)
WBC: 5.2 10*3/uL (ref 4.0–10.5)

## 2014-02-04 LAB — URINE CULTURE
Colony Count: NO GROWTH
Culture: NO GROWTH

## 2014-02-04 MED ORDER — CLONAZEPAM 0.5 MG PO TABS: 0.7500 mg | ORAL_TABLET | Freq: Three times a day (TID) | ORAL | Status: DC

## 2014-02-04 NOTE — Progress Notes (Signed)
Pt stable for discharge this AM. Please see d/c summary 02/03/2014. No changes needed.  [name], MD  [facility] Pager [phone]  If 7PM-7AM, please contact night-coverage [email or web address] Password [email or web address]

## 2014-02-04 NOTE — Clinical Social Work Note (Signed)
  CSW reviewed chart which reflected discharge summary  CSW called and spoke with [facility] to advise pt was ready for discharge  CSW faxed discharge summary, prepared packet and called for transport  CSW spoke with pt and his wife at bedside and let them know that pt would be discharging back to [facility] today  No further CSW needs.  Dede Query, LCSW [place] Worker - Weekend Coverage cell #: [phone]

## 2014-02-10 DIAGNOSIS — E119 Type 2 diabetes mellitus without complications: Secondary | ICD-10-CM | POA: Diagnosis not present

## 2014-02-10 DIAGNOSIS — G9340 Encephalopathy, unspecified: Secondary | ICD-10-CM | POA: Diagnosis not present

## 2014-02-10 DIAGNOSIS — E1165 Type 2 diabetes mellitus with hyperglycemia: Secondary | ICD-10-CM | POA: Diagnosis not present

## 2014-02-10 DIAGNOSIS — R451 Restlessness and agitation: Secondary | ICD-10-CM | POA: Diagnosis not present

## 2014-02-10 DIAGNOSIS — G253 Myoclonus: Secondary | ICD-10-CM | POA: Diagnosis not present

## 2014-02-10 DIAGNOSIS — E1129 Type 2 diabetes mellitus with other diabetic kidney complication: Secondary | ICD-10-CM | POA: Diagnosis not present

## 2014-02-10 DIAGNOSIS — R197 Diarrhea, unspecified: Secondary | ICD-10-CM | POA: Diagnosis not present

## 2014-02-10 DIAGNOSIS — N390 Urinary tract infection, site not specified: Secondary | ICD-10-CM | POA: Diagnosis not present

## 2014-02-10 DIAGNOSIS — R042 Hemoptysis: Secondary | ICD-10-CM | POA: Diagnosis not present

## 2014-02-10 DIAGNOSIS — I5040 Unspecified combined systolic (congestive) and diastolic (congestive) heart failure: Secondary | ICD-10-CM | POA: Diagnosis not present

## 2014-02-10 DIAGNOSIS — I509 Heart failure, unspecified: Secondary | ICD-10-CM | POA: Diagnosis not present

## 2014-02-10 DIAGNOSIS — I10 Essential (primary) hypertension: Secondary | ICD-10-CM | POA: Diagnosis not present

## 2014-02-14 DIAGNOSIS — R197 Diarrhea, unspecified: Secondary | ICD-10-CM | POA: Diagnosis not present

## 2014-02-14 DIAGNOSIS — R042 Hemoptysis: Secondary | ICD-10-CM | POA: Diagnosis not present

## 2014-02-16 ENCOUNTER — Telehealth: Payer: Self-pay | Admitting: *Deleted

## 2014-02-16 NOTE — Telephone Encounter (Signed)
Patient cancelled new patient appointment Dr. Carole Civil office notified

## 2014-02-17 ENCOUNTER — Ambulatory Visit: Payer: Medicare Other | Admitting: Cardiology

## 2014-02-17 ENCOUNTER — Telehealth: Payer: Self-pay

## 2014-02-17 DIAGNOSIS — N390 Urinary tract infection, site not specified: Secondary | ICD-10-CM | POA: Diagnosis not present

## 2014-02-17 NOTE — Telephone Encounter (Signed)
Joy advised.  She will drop off the paperwork along with the dates.

## 2014-02-17 NOTE — Telephone Encounter (Signed)
Joy pts daughter left v/m; pt has been sick since 12/15/2013; Caryl Asp has been taking pt to doctor appts and to ED since 12/15/13 and wants to know if Dr Damita Dunnings would complete FMLA papers for [place]. Joy request cb.

## 2014-02-17 NOTE — Telephone Encounter (Signed)
Yes, please get me the dates of the time she needed to be out of work.  Thanks.

## 2014-02-18 DIAGNOSIS — E1165 Type 2 diabetes mellitus with hyperglycemia: Secondary | ICD-10-CM | POA: Diagnosis not present

## 2014-02-18 DIAGNOSIS — G253 Myoclonus: Secondary | ICD-10-CM | POA: Diagnosis not present

## 2014-02-18 DIAGNOSIS — R197 Diarrhea, unspecified: Secondary | ICD-10-CM | POA: Diagnosis not present

## 2014-02-18 DIAGNOSIS — N390 Urinary tract infection, site not specified: Secondary | ICD-10-CM | POA: Diagnosis not present

## 2014-02-18 DIAGNOSIS — I10 Essential (primary) hypertension: Secondary | ICD-10-CM | POA: Diagnosis not present

## 2014-02-20 NOTE — Telephone Encounter (Signed)
I'll await the paperwork.  Thanks.

## 2014-02-21 ENCOUNTER — Ambulatory Visit: Payer: Medicare Other | Admitting: Neurology

## 2014-02-24 DIAGNOSIS — I10 Essential (primary) hypertension: Secondary | ICD-10-CM | POA: Diagnosis not present

## 2014-02-24 DIAGNOSIS — I509 Heart failure, unspecified: Secondary | ICD-10-CM | POA: Diagnosis not present

## 2014-02-24 DIAGNOSIS — G253 Myoclonus: Secondary | ICD-10-CM | POA: Diagnosis not present

## 2014-02-27 NOTE — Telephone Encounter (Addendum)
Paperwork received and completed by Dr. Damita Dunnings.  Joy advised.  Copy sent for scanning.

## 2014-03-01 DIAGNOSIS — I2510 Atherosclerotic heart disease of native coronary artery without angina pectoris: Secondary | ICD-10-CM | POA: Diagnosis not present

## 2014-03-01 DIAGNOSIS — N400 Enlarged prostate without lower urinary tract symptoms: Secondary | ICD-10-CM | POA: Diagnosis not present

## 2014-03-01 DIAGNOSIS — I252 Old myocardial infarction: Secondary | ICD-10-CM | POA: Diagnosis not present

## 2014-03-01 DIAGNOSIS — M4802 Spinal stenosis, cervical region: Secondary | ICD-10-CM | POA: Diagnosis not present

## 2014-03-01 DIAGNOSIS — E785 Hyperlipidemia, unspecified: Secondary | ICD-10-CM | POA: Diagnosis not present

## 2014-03-01 DIAGNOSIS — G253 Myoclonus: Secondary | ICD-10-CM | POA: Diagnosis not present

## 2014-03-01 DIAGNOSIS — I10 Essential (primary) hypertension: Secondary | ICD-10-CM | POA: Diagnosis not present

## 2014-03-01 DIAGNOSIS — E119 Type 2 diabetes mellitus without complications: Secondary | ICD-10-CM | POA: Diagnosis not present

## 2014-03-01 DIAGNOSIS — I5042 Chronic combined systolic (congestive) and diastolic (congestive) heart failure: Secondary | ICD-10-CM | POA: Diagnosis not present

## 2014-03-01 DIAGNOSIS — G9340 Encephalopathy, unspecified: Secondary | ICD-10-CM | POA: Diagnosis not present

## 2014-03-02 DIAGNOSIS — I5042 Chronic combined systolic (congestive) and diastolic (congestive) heart failure: Secondary | ICD-10-CM | POA: Diagnosis not present

## 2014-03-02 DIAGNOSIS — E119 Type 2 diabetes mellitus without complications: Secondary | ICD-10-CM | POA: Diagnosis not present

## 2014-03-02 DIAGNOSIS — G253 Myoclonus: Secondary | ICD-10-CM | POA: Diagnosis not present

## 2014-03-02 DIAGNOSIS — M4802 Spinal stenosis, cervical region: Secondary | ICD-10-CM | POA: Diagnosis not present

## 2014-03-02 DIAGNOSIS — I2510 Atherosclerotic heart disease of native coronary artery without angina pectoris: Secondary | ICD-10-CM | POA: Diagnosis not present

## 2014-03-02 DIAGNOSIS — I252 Old myocardial infarction: Secondary | ICD-10-CM | POA: Diagnosis not present

## 2014-03-02 DIAGNOSIS — G9340 Encephalopathy, unspecified: Secondary | ICD-10-CM | POA: Diagnosis not present

## 2014-03-02 DIAGNOSIS — N400 Enlarged prostate without lower urinary tract symptoms: Secondary | ICD-10-CM | POA: Diagnosis not present

## 2014-03-02 DIAGNOSIS — I10 Essential (primary) hypertension: Secondary | ICD-10-CM | POA: Diagnosis not present

## 2014-03-02 DIAGNOSIS — E785 Hyperlipidemia, unspecified: Secondary | ICD-10-CM | POA: Diagnosis not present

## 2014-03-06 ENCOUNTER — Encounter: Payer: Self-pay | Admitting: Family Medicine

## 2014-03-06 ENCOUNTER — Telehealth: Payer: Self-pay | Admitting: *Deleted

## 2014-03-06 ENCOUNTER — Ambulatory Visit (INDEPENDENT_AMBULATORY_CARE_PROVIDER_SITE_OTHER): Payer: Medicare Other | Admitting: Family Medicine

## 2014-03-06 VITALS — BP 146/70 | HR 66 | Temp 97.9°F | Wt 238.8 lb

## 2014-03-06 DIAGNOSIS — I10 Essential (primary) hypertension: Secondary | ICD-10-CM | POA: Diagnosis not present

## 2014-03-06 DIAGNOSIS — I5042 Chronic combined systolic (congestive) and diastolic (congestive) heart failure: Secondary | ICD-10-CM

## 2014-03-06 DIAGNOSIS — I252 Old myocardial infarction: Secondary | ICD-10-CM | POA: Diagnosis not present

## 2014-03-06 DIAGNOSIS — G253 Myoclonus: Secondary | ICD-10-CM

## 2014-03-06 DIAGNOSIS — I2510 Atherosclerotic heart disease of native coronary artery without angina pectoris: Secondary | ICD-10-CM | POA: Diagnosis not present

## 2014-03-06 DIAGNOSIS — G9340 Encephalopathy, unspecified: Secondary | ICD-10-CM | POA: Diagnosis not present

## 2014-03-06 DIAGNOSIS — E785 Hyperlipidemia, unspecified: Secondary | ICD-10-CM | POA: Diagnosis not present

## 2014-03-06 DIAGNOSIS — N400 Enlarged prostate without lower urinary tract symptoms: Secondary | ICD-10-CM | POA: Diagnosis not present

## 2014-03-06 DIAGNOSIS — Z79899 Other long term (current) drug therapy: Secondary | ICD-10-CM | POA: Diagnosis not present

## 2014-03-06 DIAGNOSIS — E119 Type 2 diabetes mellitus without complications: Secondary | ICD-10-CM | POA: Diagnosis not present

## 2014-03-06 DIAGNOSIS — M4802 Spinal stenosis, cervical region: Secondary | ICD-10-CM | POA: Diagnosis not present

## 2014-03-06 LAB — COMPREHENSIVE METABOLIC PANEL
ALBUMIN: 4.1 g/dL (ref 3.5–5.2)
ALT: 29 U/L (ref 0–53)
AST: 20 U/L (ref 0–37)
Alkaline Phosphatase: 46 U/L (ref 39–117)
BUN: 27 mg/dL — ABNORMAL HIGH (ref 6–23)
CHLORIDE: 102 meq/L (ref 96–112)
CO2: 29 mEq/L (ref 19–32)
Calcium: 9.8 mg/dL (ref 8.4–10.5)
Creatinine, Ser: 1.08 mg/dL (ref 0.40–1.50)
GFR: 70.43 mL/min (ref >60.00)
Glucose, Bld: 145 mg/dL — ABNORMAL HIGH (ref 70–99)
Potassium: 4 mEq/L (ref 3.5–5.1)
SODIUM: 140 meq/L (ref 135–145)
TOTAL PROTEIN: 6.8 g/dL (ref 6.0–8.3)
Total Bilirubin: 0.3 mg/dL (ref 0.2–1.2)

## 2014-03-06 LAB — CBC WITH DIFFERENTIAL/PLATELET
Basophils Absolute: 0 10*3/uL (ref 0.0–0.1)
Basophils Relative: 0.6 % (ref 0.0–3.0)
EOS PCT: 5.9 % — AB (ref 0.0–5.0)
Eosinophils Absolute: 0.4 10*3/uL (ref 0.0–0.7)
HEMATOCRIT: 46.1 % (ref 39.0–52.0)
Hemoglobin: 15.4 g/dL (ref 13.0–17.0)
Lymphocytes Relative: 28 % (ref 12.0–46.0)
Lymphs Abs: 1.8 10*3/uL (ref 0.7–4.0)
MCHC: 33.4 g/dL (ref 30.0–36.0)
MCV: 94.8 fl (ref 78.0–100.0)
Monocytes Absolute: 0.8 10*3/uL (ref 0.1–1.0)
Monocytes Relative: 12.8 % — ABNORMAL HIGH (ref 3.0–12.0)
NEUTROS ABS: 3.5 10*3/uL (ref 1.4–7.7)
Neutrophils Relative %: 52.7 % (ref 43.0–77.0)
Platelets: 282 10*3/uL (ref 150.0–400.0)
RBC: 4.86 Mil/uL (ref 4.22–5.81)
RDW: 14.3 % (ref 11.5–15.5)
WBC: 6.6 10*3/uL (ref 4.0–10.5)

## 2014-03-06 NOTE — Patient Instructions (Signed)
Go to the lab on the way out.  We'll contact you with your lab report. Monitor your daily weight and it if continues to increase, then notify me.  Keep the appointment with cardiology and we'll consider the neurology appointment after that.  Stop glimepiride for now. Cut the nighttime insulin injection back to 15 units for now. Update me with your AM sugars in a few days so we can continue to adjust your diabetes medicines if needed. Take care.  Glad to see you.

## 2014-03-06 NOTE — Progress Notes (Signed)
Pre visit review using our clinic review tool, if applicable. No additional management support is needed unless otherwise documented below in the visit note.  Interval history- mult hosp admission with mult med changes, specifically added BZD and depakote.  Now w/o any tremor since med change on 02/01/14.  Done with rehab at SNF, back home, still walking with a walker with slowed gait overall.  No falls. We had discussed neuro referral prev as outpatient but he wanted to table this for now.  He does have low sugars occ, down to 40, d/w pt. See med changes today.  No other new neuro sx.  No focal weakness, but he has noted general slowing since addition of BZD and VPA.   HTN- on mult agents, with difficult to control BP, metoprolol recently changed to carvedilol by MD at SNF.  No ADE on meds, other than general slowing noted as above and some recent inc in BLE edema. Still not SOB. Weight inc noted, but this isn't a dry weight today at clinic, it's later in the day.  No CP. Has cards f/u pending.  Compliant with meds. No salt loading.    PMH and SH reviewed  ROS: See HPI, otherwise noncontributory.  Meds, vitals, and allergies reviewed.   GEN: nad, alert and oriented HEENT: mucous membranes moist NECK: supple w/o LA CV: rrr. PULM: ctab, no inc wob ABD: soft, +bs EXT: trace BLE edema SKIN: no acute rash Walking with a walker, slow symmetric steps noted. No focal weakness in the ext x4

## 2014-03-06 NOTE — Telephone Encounter (Signed)
Wes, Physical Therapist with [facility] is requesting verbal orders for PT 2 x/week for 8 weeks.

## 2014-03-07 ENCOUNTER — Encounter: Payer: Self-pay | Admitting: Family Medicine

## 2014-03-07 ENCOUNTER — Telehealth: Payer: Self-pay | Admitting: *Deleted

## 2014-03-07 ENCOUNTER — Telehealth: Payer: Self-pay | Admitting: Family Medicine

## 2014-03-07 DIAGNOSIS — I252 Old myocardial infarction: Secondary | ICD-10-CM | POA: Diagnosis not present

## 2014-03-07 DIAGNOSIS — G9340 Encephalopathy, unspecified: Secondary | ICD-10-CM | POA: Diagnosis not present

## 2014-03-07 DIAGNOSIS — E785 Hyperlipidemia, unspecified: Secondary | ICD-10-CM | POA: Diagnosis not present

## 2014-03-07 DIAGNOSIS — G253 Myoclonus: Secondary | ICD-10-CM | POA: Diagnosis not present

## 2014-03-07 DIAGNOSIS — I5042 Chronic combined systolic (congestive) and diastolic (congestive) heart failure: Secondary | ICD-10-CM | POA: Diagnosis not present

## 2014-03-07 DIAGNOSIS — M4802 Spinal stenosis, cervical region: Secondary | ICD-10-CM | POA: Diagnosis not present

## 2014-03-07 DIAGNOSIS — I10 Essential (primary) hypertension: Secondary | ICD-10-CM | POA: Diagnosis not present

## 2014-03-07 DIAGNOSIS — N400 Enlarged prostate without lower urinary tract symptoms: Secondary | ICD-10-CM | POA: Diagnosis not present

## 2014-03-07 DIAGNOSIS — E119 Type 2 diabetes mellitus without complications: Secondary | ICD-10-CM | POA: Diagnosis not present

## 2014-03-07 DIAGNOSIS — I2510 Atherosclerotic heart disease of native coronary artery without angina pectoris: Secondary | ICD-10-CM | POA: Diagnosis not present

## 2014-03-07 LAB — VALPROIC ACID LEVEL: VALPROIC ACID LVL: 50.6 ug/mL (ref 50.0–100.0)

## 2014-03-07 NOTE — Telephone Encounter (Signed)
Please give the order.  Thanks.   

## 2014-03-07 NOTE — Assessment & Plan Note (Addendum)
Startle myoclonus, no sx now but with general slowing of gait noted, still using walker, continue PT for now at home.  No change in meds today, recheck VPA today along with CBC and CMET.  I would like him to see neuro, but he wants to proceed with cards first.  That is reasonable.  We can discuss neuro referral after cards visit done. Stop glimepiride for now. Cut the nighttime insulin injection back to 15 units for now. He'll update me on his sugars.  >25 minutes spent in face to face time with patient, >50% spent in counselling or coordination of care.

## 2014-03-07 NOTE — Telephone Encounter (Signed)
Patient returned your call.

## 2014-03-07 NOTE — Telephone Encounter (Signed)
When I contacted the patient about his lab results, the occupational therapist was at the patient's home and requested to speak to me.  She stated that the patient fell yesterday, trying to get to the chair but he didn't make it and landed on the floor.  He was able to get himself up and into the chair and reports no injury.

## 2014-03-07 NOTE — Telephone Encounter (Signed)
Then I would continue with PT for gait training if no injury.  Thanks for update.

## 2014-03-07 NOTE — Assessment & Plan Note (Signed)
Other than the trace edema, he doesn't look or sound fluid overloaded.  See notes on labs.  He has cards f/u.  I didn't start extra lasix yet.   See f/u lab result.

## 2014-03-07 NOTE — Telephone Encounter (Signed)
Pt advised of lab results.  

## 2014-03-07 NOTE — Telephone Encounter (Signed)
Left detailed message on voicemail.  

## 2014-03-09 ENCOUNTER — Telehealth: Payer: Self-pay | Admitting: Family Medicine

## 2014-03-09 DIAGNOSIS — E119 Type 2 diabetes mellitus without complications: Secondary | ICD-10-CM | POA: Diagnosis not present

## 2014-03-09 DIAGNOSIS — H2512 Age-related nuclear cataract, left eye: Secondary | ICD-10-CM | POA: Diagnosis not present

## 2014-03-09 DIAGNOSIS — H26491 Other secondary cataract, right eye: Secondary | ICD-10-CM | POA: Diagnosis not present

## 2014-03-09 DIAGNOSIS — Z961 Presence of intraocular lens: Secondary | ICD-10-CM | POA: Diagnosis not present

## 2014-03-09 DIAGNOSIS — H02831 Dermatochalasis of right upper eyelid: Secondary | ICD-10-CM | POA: Diagnosis not present

## 2014-03-09 LAB — HM DIABETES EYE EXAM

## 2014-03-09 NOTE — Telephone Encounter (Signed)
Dr. Damita Dunnings had filled out FMLA paperwork for pt on 02/27/14. Per Pts daughter Becky [place] told her that there needs to be some changed to the way it was filled out. Per [employer] "by law" for Section B, the start and end date they cannot have "to be determined" can have "lifetime or 6 months". Also for Subsection D #2 and #3, the office visit/treatments, they have to have specific dates for example 8 hours twice a week. Please call Joy when completed for pickup. Put inbox on Dr. Carole Civil desk.

## 2014-03-10 DIAGNOSIS — E785 Hyperlipidemia, unspecified: Secondary | ICD-10-CM | POA: Diagnosis not present

## 2014-03-10 DIAGNOSIS — E119 Type 2 diabetes mellitus without complications: Secondary | ICD-10-CM | POA: Diagnosis not present

## 2014-03-10 DIAGNOSIS — I10 Essential (primary) hypertension: Secondary | ICD-10-CM | POA: Diagnosis not present

## 2014-03-10 DIAGNOSIS — I2510 Atherosclerotic heart disease of native coronary artery without angina pectoris: Secondary | ICD-10-CM | POA: Diagnosis not present

## 2014-03-10 DIAGNOSIS — M4802 Spinal stenosis, cervical region: Secondary | ICD-10-CM | POA: Diagnosis not present

## 2014-03-10 DIAGNOSIS — I5042 Chronic combined systolic (congestive) and diastolic (congestive) heart failure: Secondary | ICD-10-CM | POA: Diagnosis not present

## 2014-03-10 DIAGNOSIS — G9340 Encephalopathy, unspecified: Secondary | ICD-10-CM | POA: Diagnosis not present

## 2014-03-10 DIAGNOSIS — I252 Old myocardial infarction: Secondary | ICD-10-CM | POA: Diagnosis not present

## 2014-03-10 DIAGNOSIS — G253 Myoclonus: Secondary | ICD-10-CM | POA: Diagnosis not present

## 2014-03-10 DIAGNOSIS — N400 Enlarged prostate without lower urinary tract symptoms: Secondary | ICD-10-CM | POA: Diagnosis not present

## 2014-03-10 NOTE — Telephone Encounter (Signed)
I'll change the form. To be clear, all of this about future appointments/needs/duration is a guess.  I cannot predict the future.  Regardless of what anyone at [facility] thinks (and more to the point- no one should care what they at [facility] think), "to be determined" is actually correct.

## 2014-03-10 NOTE — Telephone Encounter (Signed)
Corrected copies sent for scanning.  Message left for Joy for pick up.  Form left at front desk for pick up.

## 2014-03-13 DIAGNOSIS — I2510 Atherosclerotic heart disease of native coronary artery without angina pectoris: Secondary | ICD-10-CM | POA: Diagnosis not present

## 2014-03-13 DIAGNOSIS — I252 Old myocardial infarction: Secondary | ICD-10-CM | POA: Diagnosis not present

## 2014-03-13 DIAGNOSIS — E119 Type 2 diabetes mellitus without complications: Secondary | ICD-10-CM | POA: Diagnosis not present

## 2014-03-13 DIAGNOSIS — G253 Myoclonus: Secondary | ICD-10-CM | POA: Diagnosis not present

## 2014-03-13 DIAGNOSIS — I5042 Chronic combined systolic (congestive) and diastolic (congestive) heart failure: Secondary | ICD-10-CM | POA: Diagnosis not present

## 2014-03-13 DIAGNOSIS — E785 Hyperlipidemia, unspecified: Secondary | ICD-10-CM | POA: Diagnosis not present

## 2014-03-13 DIAGNOSIS — M4802 Spinal stenosis, cervical region: Secondary | ICD-10-CM | POA: Diagnosis not present

## 2014-03-13 DIAGNOSIS — I10 Essential (primary) hypertension: Secondary | ICD-10-CM | POA: Diagnosis not present

## 2014-03-13 DIAGNOSIS — G9340 Encephalopathy, unspecified: Secondary | ICD-10-CM | POA: Diagnosis not present

## 2014-03-13 DIAGNOSIS — N400 Enlarged prostate without lower urinary tract symptoms: Secondary | ICD-10-CM | POA: Diagnosis not present

## 2014-03-14 DIAGNOSIS — I10 Essential (primary) hypertension: Secondary | ICD-10-CM | POA: Diagnosis not present

## 2014-03-14 DIAGNOSIS — E785 Hyperlipidemia, unspecified: Secondary | ICD-10-CM | POA: Diagnosis not present

## 2014-03-14 DIAGNOSIS — I2510 Atherosclerotic heart disease of native coronary artery without angina pectoris: Secondary | ICD-10-CM | POA: Diagnosis not present

## 2014-03-14 DIAGNOSIS — M4802 Spinal stenosis, cervical region: Secondary | ICD-10-CM | POA: Diagnosis not present

## 2014-03-14 DIAGNOSIS — E119 Type 2 diabetes mellitus without complications: Secondary | ICD-10-CM | POA: Diagnosis not present

## 2014-03-14 DIAGNOSIS — N400 Enlarged prostate without lower urinary tract symptoms: Secondary | ICD-10-CM | POA: Diagnosis not present

## 2014-03-14 DIAGNOSIS — I5042 Chronic combined systolic (congestive) and diastolic (congestive) heart failure: Secondary | ICD-10-CM | POA: Diagnosis not present

## 2014-03-14 DIAGNOSIS — I252 Old myocardial infarction: Secondary | ICD-10-CM | POA: Diagnosis not present

## 2014-03-14 DIAGNOSIS — G253 Myoclonus: Secondary | ICD-10-CM | POA: Diagnosis not present

## 2014-03-14 DIAGNOSIS — G9340 Encephalopathy, unspecified: Secondary | ICD-10-CM | POA: Diagnosis not present

## 2014-03-15 ENCOUNTER — Encounter: Payer: Self-pay | Admitting: Cardiology

## 2014-03-15 ENCOUNTER — Ambulatory Visit (INDEPENDENT_AMBULATORY_CARE_PROVIDER_SITE_OTHER): Payer: Medicare Other | Admitting: Cardiology

## 2014-03-15 VITALS — BP 140/62 | HR 72 | Ht 72.0 in | Wt 235.0 lb

## 2014-03-15 DIAGNOSIS — R55 Syncope and collapse: Secondary | ICD-10-CM | POA: Diagnosis not present

## 2014-03-15 DIAGNOSIS — I2510 Atherosclerotic heart disease of native coronary artery without angina pectoris: Secondary | ICD-10-CM | POA: Diagnosis not present

## 2014-03-15 DIAGNOSIS — I5042 Chronic combined systolic (congestive) and diastolic (congestive) heart failure: Secondary | ICD-10-CM | POA: Diagnosis not present

## 2014-03-15 DIAGNOSIS — E785 Hyperlipidemia, unspecified: Secondary | ICD-10-CM

## 2014-03-15 DIAGNOSIS — I252 Old myocardial infarction: Secondary | ICD-10-CM | POA: Diagnosis not present

## 2014-03-15 DIAGNOSIS — N400 Enlarged prostate without lower urinary tract symptoms: Secondary | ICD-10-CM | POA: Diagnosis not present

## 2014-03-15 DIAGNOSIS — R531 Weakness: Secondary | ICD-10-CM

## 2014-03-15 DIAGNOSIS — G9340 Encephalopathy, unspecified: Secondary | ICD-10-CM | POA: Diagnosis not present

## 2014-03-15 DIAGNOSIS — M4802 Spinal stenosis, cervical region: Secondary | ICD-10-CM | POA: Diagnosis not present

## 2014-03-15 DIAGNOSIS — G253 Myoclonus: Secondary | ICD-10-CM | POA: Diagnosis not present

## 2014-03-15 DIAGNOSIS — I10 Essential (primary) hypertension: Secondary | ICD-10-CM | POA: Diagnosis not present

## 2014-03-15 DIAGNOSIS — E119 Type 2 diabetes mellitus without complications: Secondary | ICD-10-CM | POA: Diagnosis not present

## 2014-03-15 NOTE — Progress Notes (Signed)
[facility]. [address], [address] [place], [place]  [address] Phone: [phone] Fax:  [phone]  Date:  03/15/2014   ID:  [name], DOB 28-Jun-1934, MRN [id number]  PCP:  [name], MD   History of Present Illness: [name] is a 80 y.o. male here for evaluation of syncope. Fell on Monday in the bathroom. "Just fell". Did not faint he thinks. Has a history of startle myoclonus, slowing of gait, uses walker. On valproic acid. Wanted to have a cardiology evaluation prior to neurologic referral. Blood sugars at home and been down to the 40s. No focal weakness.  Since 12/15/13 - been having tremors. Went to [facility] twice, [place], [place], [place] ([facility]). Nothing has improved. No syncope.   No chest pain, no SOB, no palpation.   Low risk NUC stress EF 47%, no ischemia. Apical infarct 04/20/2008 Prior Cath normal.   Pink like nasal discharge.   Ambulates with a walker. Appears quite weak.  Wt Readings from Last 3 Encounters:  03/15/14 235 lb (106.595 kg)  03/06/14 238 lb 12 oz (108.296 kg)  02/01/14 220 lb 9.6 oz (100.064 kg)     Past Medical History  Diagnosis Date  . CAD (coronary artery disease)   . History of syncope   . HTN (hypertension)   . Borderline hyperlipidemia   . History of gastritis   . Diverticulosis of colon   . Colon polyps   . Hypertrophy of prostate with urinary obstruction and other lower urinary tract symptoms (LUTS)   . History of pyelonephritis   . DJD (degenerative joint disease)   . Anxiety   . DM (diabetes mellitus)     Adult onset  . 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 History of kidney stones   . Myoclonus     Past Surgical History  Procedure Laterality Date  . Appendectomy    . Cataract extraction      RIGHT EYE  . Knee surgery      bilateral ARTHROSCOPY X2 TO EACH KNEE  . Shoulder surgery  04/2005    left by Dr. Berenice Primas  . S/p elap 1986 w/excision  of leiomyoma @ ge junction, meckle's divertic resected, & cholecystectomy    . S/p cysto & stents for kidnedy stone 11/08 by dr. Reece Agar    . Cystoscopy  04/17/11    stent placed  . Cystoscopy w/ ureteral stent placement  04/17/2011    Procedure: CYSTOSCOPY WITH RETROGRADE PYELOGRAM/URETERAL STENT PLACEMENT;  Surgeon: Hanley Ben, MD;  Location: [facility];  Service: Urology;  Laterality: Left;  . Cystoscopy with retrograde pyelogram, ureteroscopy and stent placement Left 04/18/2013    Procedure: CYSTOSCOPY WITH LEFT RETROGRADE PYELOGRAM, LEFT URETEROSCOPY AND LASER LITHOTRIPSY LEFT STENT PLACEMENT;  Surgeon: Dutch Gray, MD;  Location: [facility];  Service: Urology;  Laterality: Left;  . Holmium laser application Left 04/18/2503    Procedure: HOLMIUM LASER APPLICATION;  Surgeon: Dutch Gray, MD;  Location: [facility];  Service: Urology;  Laterality: Left;    Current Outpatient Prescriptions  Medication Sig Dispense Refill  . amLODipine (NORVASC) 10 MG tablet Take 1 tablet (10 mg total) by mouth every morning. 90 tablet 3  . aspirin EC 81 MG tablet Take 81 mg by mouth every morning.     . carvedilol (COREG) 12.5 MG tablet Take 12.5 mg by mouth 2 (two) times  daily with a meal.    . clonazePAM (KLONOPIN) 0.5 MG tablet Take 1.5 tablets (0.75 mg total) by mouth 3 (three) times daily. 30 tablet 0  . hydrALAZINE (APRESOLINE) 50 MG tablet Take 50 mg by mouth 3 (three) times daily.    . insulin aspart (NOVOLOG) 100 UNIT/ML injection Inject 4-10 Units into the skin 3 (three) times daily before meals. (Patient taking differently: Inject 0-10 Units into the skin 3 (three) times daily before meals. SSI: <150 = 0 units, 151-200 = 4 units, 201-250 units, 251-300 = 8 units, >300 = 10 units) 10 mL   . Insulin Glargine ([name] SOLOSTAR) 300 UNIT/ML SOPN Inject 15 Units into the skin at bedtime.     Marland Kitchen lisinopril-hydrochlorothiazide (PRINZIDE,ZESTORETIC) 20-25 MG per tablet Take 1 tablet by mouth every morning.     . metFORMIN  (GLUCOPHAGE) 1000 MG tablet Take 1 tablet (1,000 mg total) by mouth 2 (two) times daily with a meal. 180 tablet 3  . potassium chloride SA (K-DUR,KLOR-CON) 20 MEQ tablet Take 20 mEq by mouth once.    . pravastatin (PRAVACHOL) 40 MG tablet Take 1 tablet (40 mg total) by mouth at bedtime. (Patient taking differently: Take 40 mg by mouth every morning. ) 90 tablet 3  . tamsulosin (FLOMAX) 0.4 MG CAPS capsule Take 0.4 mg by mouth daily after supper.    . valproic acid (DEPAKENE) 250 MG capsule Take 2 capsules (500 mg total) by mouth 2 (two) times daily. 120 capsule 0   No current facility-administered medications for this visit.    Allergies:    Allergies  Allergen Reactions  . Codeine Phosphate Nausea Only    Can take with food and usually doesn't cause nausea  . Morphine Nausea Only    Can take with food and usually doesn't cause nausea    Social History:  The patient  reports that he has never smoked. He has never used smokeless tobacco. He reports that he does not drink alcohol or use illicit drugs.   Family History  Problem Relation Age of Onset  . Heart attack Mother   . Dementia Brother   . Colon cancer Neg Hx   . Prostate cancer Neg Hx     ROS:  Please see the history of present illness.   Generalize weakness, decreased memory, recent fall, no chest pain, no shortness of breath, no palpitations.  All other systems reviewed and negative.   PHYSICAL EXAM: VS:  BP 140/62 mmHg  Pulse 72  Ht 6' (1.829 m)  Wt 235 lb (106.595 kg)  BMI 31.86 kg/m2 Well nourished, well developed, in no acute distress, generalize weakness HEENT: normal, [place]/AT, EOMI Neck: no JVD, normal carotid upstroke, no bruit Cardiac:  normal S1, S2; RRR; no murmur Lungs:  clear to auscultation bilaterally, no wheezing, rhonchi or rales Abd: soft, nontender, no hepatomegaly, no bruits Ext: no edema, 2+ distal pulses Skin: warm and dry GU: deferred Neuro: Appear generalized weakness, AAO x 3  EKG:   01/28/14-sinus rhythm, left axis deviation, no other specific abnormalities.  Echocardiogram: 01/27/14-ejection fraction 45-50%. Lab work reviewed. Creatinine 1.08, hemoglobin 15, TSH 2.5  ASSESSMENT AND PLAN:  1. Recent fall-he denies any syncope. Generalized weakness unexplained. Valproic acid/clonazepam playing a role. Continue to encourage neurologic referral. EKG reassuring. Recent echocardiogram reassuring with only minimal decrease in ejection fraction. Very similar finding on nuclear stress test in 2010 which showed no ischemia. I suggested holding pravastatin in case there could possibly be a correlation with derived weakness.  No further cardiac studies necessary. If he has unexplained syncope/loss of consciousness, could encourage event monitor in the future. For now, no further workup. 2. Hyperlipidemia-as described above. I suggested holding his pravastatin to see if this helps at all with his neurologic findings. If not, he may resume. 3. Essential hypertension-currently reasonably controlled. Blood pressure borderline elevated today. Continue to take his current medications. Dr. Damita Dunnings monitoring. 4. Mildly reduced ejection fraction-45-50%-continue with carvedilol, lisinopril. Excellent choices. Should be of no clinical consequence. 5. We will see him back on as-needed basis.  Signed, Candee Furbish, MD [facility]  03/15/2014 11:20 AM

## 2014-03-15 NOTE — Patient Instructions (Signed)
The current medical regimen is effective;  continue present plan and medications.  Follow up as needed.  Thank you for choosing [facility]!!

## 2014-03-16 ENCOUNTER — Other Ambulatory Visit: Payer: Self-pay | Admitting: *Deleted

## 2014-03-16 ENCOUNTER — Other Ambulatory Visit: Payer: Self-pay | Admitting: Pulmonary Disease

## 2014-03-16 DIAGNOSIS — G9340 Encephalopathy, unspecified: Secondary | ICD-10-CM | POA: Diagnosis not present

## 2014-03-16 DIAGNOSIS — M4802 Spinal stenosis, cervical region: Secondary | ICD-10-CM | POA: Diagnosis not present

## 2014-03-16 DIAGNOSIS — I252 Old myocardial infarction: Secondary | ICD-10-CM | POA: Diagnosis not present

## 2014-03-16 DIAGNOSIS — E119 Type 2 diabetes mellitus without complications: Secondary | ICD-10-CM | POA: Diagnosis not present

## 2014-03-16 DIAGNOSIS — I5042 Chronic combined systolic (congestive) and diastolic (congestive) heart failure: Secondary | ICD-10-CM | POA: Diagnosis not present

## 2014-03-16 DIAGNOSIS — G253 Myoclonus: Secondary | ICD-10-CM | POA: Diagnosis not present

## 2014-03-16 DIAGNOSIS — I10 Essential (primary) hypertension: Secondary | ICD-10-CM | POA: Diagnosis not present

## 2014-03-16 DIAGNOSIS — I2510 Atherosclerotic heart disease of native coronary artery without angina pectoris: Secondary | ICD-10-CM | POA: Diagnosis not present

## 2014-03-16 DIAGNOSIS — N400 Enlarged prostate without lower urinary tract symptoms: Secondary | ICD-10-CM | POA: Diagnosis not present

## 2014-03-16 DIAGNOSIS — E785 Hyperlipidemia, unspecified: Secondary | ICD-10-CM | POA: Diagnosis not present

## 2014-03-16 MED ORDER — INSULIN PEN NEEDLE 30G X 8 MM MISC: Status: DC

## 2014-03-16 MED ORDER — GLUCOSE BLOOD VI STRP: ORAL_STRIP | Status: DC

## 2014-03-19 ENCOUNTER — Telehealth: Payer: Self-pay | Admitting: Family Medicine

## 2014-03-19 DIAGNOSIS — G253 Myoclonus: Secondary | ICD-10-CM

## 2014-03-19 NOTE — Telephone Encounter (Signed)
Call pt.  I saw that he had f/u with cards.  How are his sugars running and is he willing to go through with neuro consult?  Thanks.

## 2014-03-20 ENCOUNTER — Other Ambulatory Visit: Payer: Self-pay | Admitting: *Deleted

## 2014-03-20 DIAGNOSIS — G253 Myoclonus: Secondary | ICD-10-CM | POA: Diagnosis not present

## 2014-03-20 DIAGNOSIS — I10 Essential (primary) hypertension: Secondary | ICD-10-CM | POA: Diagnosis not present

## 2014-03-20 DIAGNOSIS — E785 Hyperlipidemia, unspecified: Secondary | ICD-10-CM | POA: Diagnosis not present

## 2014-03-20 DIAGNOSIS — E119 Type 2 diabetes mellitus without complications: Secondary | ICD-10-CM | POA: Diagnosis not present

## 2014-03-20 DIAGNOSIS — N400 Enlarged prostate without lower urinary tract symptoms: Secondary | ICD-10-CM | POA: Diagnosis not present

## 2014-03-20 DIAGNOSIS — I252 Old myocardial infarction: Secondary | ICD-10-CM | POA: Diagnosis not present

## 2014-03-20 DIAGNOSIS — I2510 Atherosclerotic heart disease of native coronary artery without angina pectoris: Secondary | ICD-10-CM | POA: Diagnosis not present

## 2014-03-20 DIAGNOSIS — M4802 Spinal stenosis, cervical region: Secondary | ICD-10-CM | POA: Diagnosis not present

## 2014-03-20 DIAGNOSIS — G9340 Encephalopathy, unspecified: Secondary | ICD-10-CM | POA: Diagnosis not present

## 2014-03-20 DIAGNOSIS — I5042 Chronic combined systolic (congestive) and diastolic (congestive) heart failure: Secondary | ICD-10-CM | POA: Diagnosis not present

## 2014-03-20 NOTE — Telephone Encounter (Signed)
Faxed refill request.  ? You prescribing previously.  Please advise.

## 2014-03-20 NOTE — Telephone Encounter (Signed)
Patient says he is willing to do neurology consult but cards said for him to be off of the cholesterol medicine for 2 weeks before anything further is done.

## 2014-03-21 ENCOUNTER — Telehealth: Payer: Self-pay | Admitting: Family Medicine

## 2014-03-21 ENCOUNTER — Ambulatory Visit (INDEPENDENT_AMBULATORY_CARE_PROVIDER_SITE_OTHER): Payer: Medicare Other | Admitting: Family Medicine

## 2014-03-21 ENCOUNTER — Other Ambulatory Visit: Payer: Self-pay | Admitting: *Deleted

## 2014-03-21 ENCOUNTER — Encounter: Payer: Self-pay | Admitting: Family Medicine

## 2014-03-21 VITALS — BP 168/90 | HR 71 | Temp 97.7°F | Wt 231.5 lb

## 2014-03-21 DIAGNOSIS — E1121 Type 2 diabetes mellitus with diabetic nephropathy: Secondary | ICD-10-CM

## 2014-03-21 DIAGNOSIS — G253 Myoclonus: Secondary | ICD-10-CM

## 2014-03-21 DIAGNOSIS — I5042 Chronic combined systolic (congestive) and diastolic (congestive) heart failure: Secondary | ICD-10-CM | POA: Diagnosis not present

## 2014-03-21 DIAGNOSIS — E119 Type 2 diabetes mellitus without complications: Secondary | ICD-10-CM | POA: Diagnosis not present

## 2014-03-21 DIAGNOSIS — I2510 Atherosclerotic heart disease of native coronary artery without angina pectoris: Secondary | ICD-10-CM | POA: Diagnosis not present

## 2014-03-21 DIAGNOSIS — R197 Diarrhea, unspecified: Secondary | ICD-10-CM

## 2014-03-21 DIAGNOSIS — E785 Hyperlipidemia, unspecified: Secondary | ICD-10-CM | POA: Diagnosis not present

## 2014-03-21 DIAGNOSIS — N400 Enlarged prostate without lower urinary tract symptoms: Secondary | ICD-10-CM | POA: Diagnosis not present

## 2014-03-21 DIAGNOSIS — M4802 Spinal stenosis, cervical region: Secondary | ICD-10-CM | POA: Diagnosis not present

## 2014-03-21 DIAGNOSIS — G9340 Encephalopathy, unspecified: Secondary | ICD-10-CM | POA: Diagnosis not present

## 2014-03-21 DIAGNOSIS — I10 Essential (primary) hypertension: Secondary | ICD-10-CM | POA: Diagnosis not present

## 2014-03-21 DIAGNOSIS — I252 Old myocardial infarction: Secondary | ICD-10-CM | POA: Diagnosis not present

## 2014-03-21 MED ORDER — METFORMIN HCL 1000 MG PO TABS: 500.0000 mg | ORAL_TABLET | Freq: Two times a day (BID) | ORAL | Status: DC

## 2014-03-21 MED ORDER — INSULIN GLARGINE 300 UNIT/ML ~~LOC~~ SOPN: 12.0000 [IU] | PEN_INJECTOR | Freq: Every day | SUBCUTANEOUS | Status: DC

## 2014-03-21 MED ORDER — HYDRALAZINE HCL 50 MG PO TABS: 50.0000 mg | ORAL_TABLET | Freq: Three times a day (TID) | ORAL | Status: DC

## 2014-03-21 MED ORDER — CARVEDILOL 12.5 MG PO TABS: 12.5000 mg | ORAL_TABLET | Freq: Two times a day (BID) | ORAL | Status: DC

## 2014-03-21 MED ORDER — INSULIN ASPART 100 UNIT/ML ~~LOC~~ SOLN: SUBCUTANEOUS | Status: DC

## 2014-03-21 NOTE — Telephone Encounter (Signed)
[facility] Patient Name: [name] DOB: Jul 11, 1934 Initial Comment Caller states he has diarrhea for several days; CBWN - caller states fathers BS dropped twice yesterday and is concerned it may happen again and she is at work Nurse Assessment Nurse: Ronnald Ramp, RN, Miranda Date/Time ([place] Time): 03/21/2014 11:57:49 AM Confirm and document reason for call. If symptomatic, describe symptoms. ---Caller states he has been having diarrhea for a couple of weeks. BS 179 this morning. Has the patient traveled out of the country within the last 30 days? ---No Does the patient require triage? ---Yes Related visit to physician within the last 2 weeks? ---No Does the PT have any chronic conditions? (i.e. diabetes, asthma, etc.) ---Yes List chronic conditions. ---Tremors, Diabetes, HTN Guidelines Guideline Title Affirmed Question Affirmed Notes Diarrhea [1] Age > 60 years AND [2] > 6 diarrhea stools in past 24 hours Final Disposition User See Physician within 4 Hours (or PCP triage) Ronnald Ramp, RN, Miranda Comments CBWN- caller states she is dtr, [name] @ [phone] and wants to speak with nurse. Person on the phone for the call back was the pt's daughter. She is not with the pt. She states pt already has an appt for this afternoon and not sure why the office transferred her to the nurse. Told caller that I could not give her medical information because of HIPPA violation. Told her that I would try to reach patient since he had called in himself earlier. Patient already has an appt for today at 4:15 with Dr. Damita Dunnings

## 2014-03-21 NOTE — Telephone Encounter (Signed)
Pt has appt today with Dr Damita Dunnings at 4:15 pm.

## 2014-03-21 NOTE — Telephone Encounter (Signed)
I went ahead and put in the referral, we can always cancel it if needed.  Will ask for the referral to be at least 2 weeks out.

## 2014-03-21 NOTE — Progress Notes (Signed)
Pre visit review using our clinic review tool, if applicable. No additional management support is needed unless otherwise documented below in the visit note.  Patient nearly fell when he reached the exam room but was able to catch himself on the chair/countertop.  Daughter says his BS has either been way up or way down.  He is very weak in his legs and he has had severe diarrhea x 3-4 days. Mike Craze, CMA  He had had diarrhea prev, but worse recently, in the last few days.  Not recently on abx.  Diarrhea is reddish/pinkish.  He has noted some mucous in stools recently.  He has been lightheaded, but much better recently.  Fecal urgency noted.  Recently with 2-3 BMs per day recently.  No fevers, no abd pain.   Low sugars, down to 30s.  He felt awful initially, was able to get a snack and eventually recovered with the last low sugar.  Compliant with meds.  Doses reviewed with patient.   We talked about neuro f/u and he wanted to wait 2 weeks off statin in meantime to see if he improved.  This is reasonable.  The referral for neuro was placed in the meantime.    He agrees to have his family stay with him temporarily.    Meds, vitals, and allergies reviewed.   ROS: See HPI.  Otherwise, noncontributory.  nad ncat Mmm Neck supple, no LA rrr ctab abd soft, no ttp, no rebound, normal BS Ext with trace edema

## 2014-03-21 NOTE — Telephone Encounter (Signed)
Yes, okay, sent.  Thanks.

## 2014-03-21 NOTE — Patient Instructions (Signed)
Go to the lab on the way out.  We'll contact you with your lab report. Cut the metformin back to 1/2 tab twice a day.  Keep taking your sliding scale insulin but cut the nighttime insulin back to 12 units.  Update me in a few days.  Take care.  Glad to see you.

## 2014-03-22 DIAGNOSIS — R197 Diarrhea, unspecified: Secondary | ICD-10-CM | POA: Diagnosis not present

## 2014-03-22 LAB — COMPREHENSIVE METABOLIC PANEL
ALBUMIN: 4.2 g/dL (ref 3.5–5.2)
ALT: 29 U/L (ref 0–53)
AST: 18 U/L (ref 0–37)
Alkaline Phosphatase: 51 U/L (ref 39–117)
BILIRUBIN TOTAL: 0.3 mg/dL (ref 0.2–1.2)
BUN: 29 mg/dL — ABNORMAL HIGH (ref 6–23)
CHLORIDE: 101 meq/L (ref 96–112)
CO2: 28 meq/L (ref 19–32)
Calcium: 9.9 mg/dL (ref 8.4–10.5)
Creatinine, Ser: 1.09 mg/dL (ref 0.40–1.50)
GFR: 69.68 mL/min (ref >60.00)
GLUCOSE: 77 mg/dL (ref 70–99)
POTASSIUM: 4 meq/L (ref 3.5–5.1)
Sodium: 137 mEq/L (ref 135–145)
TOTAL PROTEIN: 6.9 g/dL (ref 6.0–8.3)

## 2014-03-22 LAB — CBC WITH DIFFERENTIAL/PLATELET
BASOS PCT: 0.6 % (ref 0.0–3.0)
Basophils Absolute: 0.1 10*3/uL (ref 0.0–0.1)
EOS PCT: 4.6 % (ref 0.0–5.0)
Eosinophils Absolute: 0.4 10*3/uL (ref 0.0–0.7)
HEMATOCRIT: 46.4 % (ref 39.0–52.0)
Hemoglobin: 15.5 g/dL (ref 13.0–17.0)
LYMPHS ABS: 2.2 10*3/uL (ref 0.7–4.0)
Lymphocytes Relative: 26.7 % (ref 12.0–46.0)
MCHC: 33.4 g/dL (ref 30.0–36.0)
MCV: 93.8 fl (ref 78.0–100.0)
MONO ABS: 1 10*3/uL (ref 0.1–1.0)
MONOS PCT: 11.6 % (ref 3.0–12.0)
Neutro Abs: 4.7 10*3/uL (ref 1.4–7.7)
Neutrophils Relative %: 56.5 % (ref 43.0–77.0)
PLATELETS: 322 10*3/uL (ref 150.0–400.0)
RBC: 4.95 Mil/uL (ref 4.22–5.81)
RDW: 14.1 % (ref 11.5–15.5)
WBC: 8.4 10*3/uL (ref 4.0–10.5)

## 2014-03-22 NOTE — Addendum Note (Signed)
Addended by: Ellamae Sia on: 03/22/2014 12:22 PM   Modules accepted: Orders

## 2014-03-22 NOTE — Assessment & Plan Note (Signed)
Unclear source. Check CBC and IFOB, check for c diff.  D/w pt.  Benign exam, unclear if metformin related, cut that dose in 1/2.  D/w pt.  He agrees.  He'll update me.  Still okay for outpatient f/u, he'll have family staying with him temporarily.

## 2014-03-22 NOTE — Assessment & Plan Note (Signed)
He'll hold statin, left on med list for now, for 2 weeks.  If not improved, then proceed with neuro eval.  He agrees. >25 minutes spent in face to face time with patient, >50% spent in counselling or coordination of care.

## 2014-03-22 NOTE — Assessment & Plan Note (Signed)
Check BMET today, dec qhs insulin and dec metformin.  He'll update me with his sugars.  Unclear how much of the low sugars are related to diarrhea/malabsorption.

## 2014-03-23 ENCOUNTER — Encounter: Payer: Self-pay | Admitting: *Deleted

## 2014-03-23 ENCOUNTER — Other Ambulatory Visit: Payer: Self-pay | Admitting: Family Medicine

## 2014-03-23 DIAGNOSIS — M4802 Spinal stenosis, cervical region: Secondary | ICD-10-CM | POA: Diagnosis not present

## 2014-03-23 DIAGNOSIS — I5042 Chronic combined systolic (congestive) and diastolic (congestive) heart failure: Secondary | ICD-10-CM | POA: Diagnosis not present

## 2014-03-23 DIAGNOSIS — I252 Old myocardial infarction: Secondary | ICD-10-CM | POA: Diagnosis not present

## 2014-03-23 DIAGNOSIS — E119 Type 2 diabetes mellitus without complications: Secondary | ICD-10-CM | POA: Diagnosis not present

## 2014-03-23 DIAGNOSIS — I2510 Atherosclerotic heart disease of native coronary artery without angina pectoris: Secondary | ICD-10-CM | POA: Diagnosis not present

## 2014-03-23 DIAGNOSIS — E785 Hyperlipidemia, unspecified: Secondary | ICD-10-CM | POA: Diagnosis not present

## 2014-03-23 DIAGNOSIS — I10 Essential (primary) hypertension: Secondary | ICD-10-CM | POA: Diagnosis not present

## 2014-03-23 DIAGNOSIS — G9340 Encephalopathy, unspecified: Secondary | ICD-10-CM | POA: Diagnosis not present

## 2014-03-23 DIAGNOSIS — N400 Enlarged prostate without lower urinary tract symptoms: Secondary | ICD-10-CM | POA: Diagnosis not present

## 2014-03-23 DIAGNOSIS — G253 Myoclonus: Secondary | ICD-10-CM | POA: Diagnosis not present

## 2014-03-23 LAB — C. DIFFICILE GDH AND TOXIN A/B
C. DIFFICILE GDH: NOT DETECTED
C. difficile Toxin A/B: NOT DETECTED

## 2014-03-24 ENCOUNTER — Other Ambulatory Visit (INDEPENDENT_AMBULATORY_CARE_PROVIDER_SITE_OTHER): Payer: Medicare Other

## 2014-03-24 ENCOUNTER — Other Ambulatory Visit: Payer: Self-pay | Admitting: *Deleted

## 2014-03-24 DIAGNOSIS — R197 Diarrhea, unspecified: Secondary | ICD-10-CM | POA: Diagnosis not present

## 2014-03-24 DIAGNOSIS — N400 Enlarged prostate without lower urinary tract symptoms: Secondary | ICD-10-CM | POA: Diagnosis not present

## 2014-03-24 DIAGNOSIS — G9340 Encephalopathy, unspecified: Secondary | ICD-10-CM | POA: Diagnosis not present

## 2014-03-24 DIAGNOSIS — I5042 Chronic combined systolic (congestive) and diastolic (congestive) heart failure: Secondary | ICD-10-CM | POA: Diagnosis not present

## 2014-03-24 DIAGNOSIS — I252 Old myocardial infarction: Secondary | ICD-10-CM | POA: Diagnosis not present

## 2014-03-24 DIAGNOSIS — I10 Essential (primary) hypertension: Secondary | ICD-10-CM | POA: Diagnosis not present

## 2014-03-24 DIAGNOSIS — E785 Hyperlipidemia, unspecified: Secondary | ICD-10-CM | POA: Diagnosis not present

## 2014-03-24 DIAGNOSIS — E119 Type 2 diabetes mellitus without complications: Secondary | ICD-10-CM | POA: Diagnosis not present

## 2014-03-24 DIAGNOSIS — G253 Myoclonus: Secondary | ICD-10-CM | POA: Diagnosis not present

## 2014-03-24 DIAGNOSIS — I2510 Atherosclerotic heart disease of native coronary artery without angina pectoris: Secondary | ICD-10-CM | POA: Diagnosis not present

## 2014-03-24 DIAGNOSIS — M4802 Spinal stenosis, cervical region: Secondary | ICD-10-CM | POA: Diagnosis not present

## 2014-03-24 LAB — FECAL OCCULT BLOOD, IMMUNOCHEMICAL: FECAL OCCULT BLD: NEGATIVE

## 2014-03-24 NOTE — Telephone Encounter (Signed)
Faxed refill request.   Clonazepam Last Filled:    30 tablet 0 RF on 02/04/2014  Depakote Last Filled:   60 tabs  0 RF on 02/24/14 Please advise.

## 2014-03-26 MED ORDER — DIVALPROEX SODIUM 500 MG PO DR TAB: 500.0000 mg | DELAYED_RELEASE_TABLET | Freq: Two times a day (BID) | ORAL | Status: DC

## 2014-03-26 MED ORDER — CLONAZEPAM 0.5 MG PO TABS: 0.7500 mg | ORAL_TABLET | Freq: Three times a day (TID) | ORAL | Status: DC

## 2014-03-26 NOTE — Telephone Encounter (Signed)
depakote sent, please call in klonopin.  Thanks.

## 2014-03-27 DIAGNOSIS — E119 Type 2 diabetes mellitus without complications: Secondary | ICD-10-CM | POA: Diagnosis not present

## 2014-03-27 DIAGNOSIS — I252 Old myocardial infarction: Secondary | ICD-10-CM | POA: Diagnosis not present

## 2014-03-27 DIAGNOSIS — G9340 Encephalopathy, unspecified: Secondary | ICD-10-CM | POA: Diagnosis not present

## 2014-03-27 DIAGNOSIS — I10 Essential (primary) hypertension: Secondary | ICD-10-CM | POA: Diagnosis not present

## 2014-03-27 DIAGNOSIS — M4802 Spinal stenosis, cervical region: Secondary | ICD-10-CM | POA: Diagnosis not present

## 2014-03-27 DIAGNOSIS — N400 Enlarged prostate without lower urinary tract symptoms: Secondary | ICD-10-CM | POA: Diagnosis not present

## 2014-03-27 DIAGNOSIS — E785 Hyperlipidemia, unspecified: Secondary | ICD-10-CM | POA: Diagnosis not present

## 2014-03-27 DIAGNOSIS — I2510 Atherosclerotic heart disease of native coronary artery without angina pectoris: Secondary | ICD-10-CM | POA: Diagnosis not present

## 2014-03-27 DIAGNOSIS — G253 Myoclonus: Secondary | ICD-10-CM | POA: Diagnosis not present

## 2014-03-27 DIAGNOSIS — I5042 Chronic combined systolic (congestive) and diastolic (congestive) heart failure: Secondary | ICD-10-CM | POA: Diagnosis not present

## 2014-03-27 NOTE — Telephone Encounter (Signed)
Medication phoned to pharmacy.  

## 2014-03-28 DIAGNOSIS — I10 Essential (primary) hypertension: Secondary | ICD-10-CM | POA: Diagnosis not present

## 2014-03-28 DIAGNOSIS — N400 Enlarged prostate without lower urinary tract symptoms: Secondary | ICD-10-CM | POA: Diagnosis not present

## 2014-03-28 DIAGNOSIS — M4802 Spinal stenosis, cervical region: Secondary | ICD-10-CM | POA: Diagnosis not present

## 2014-03-28 DIAGNOSIS — I2510 Atherosclerotic heart disease of native coronary artery without angina pectoris: Secondary | ICD-10-CM | POA: Diagnosis not present

## 2014-03-28 DIAGNOSIS — I5042 Chronic combined systolic (congestive) and diastolic (congestive) heart failure: Secondary | ICD-10-CM | POA: Diagnosis not present

## 2014-03-28 DIAGNOSIS — G9340 Encephalopathy, unspecified: Secondary | ICD-10-CM | POA: Diagnosis not present

## 2014-03-28 DIAGNOSIS — I252 Old myocardial infarction: Secondary | ICD-10-CM | POA: Diagnosis not present

## 2014-03-28 DIAGNOSIS — E119 Type 2 diabetes mellitus without complications: Secondary | ICD-10-CM | POA: Diagnosis not present

## 2014-03-28 DIAGNOSIS — E785 Hyperlipidemia, unspecified: Secondary | ICD-10-CM | POA: Diagnosis not present

## 2014-03-28 DIAGNOSIS — G253 Myoclonus: Secondary | ICD-10-CM | POA: Diagnosis not present

## 2014-03-29 DIAGNOSIS — I252 Old myocardial infarction: Secondary | ICD-10-CM | POA: Diagnosis not present

## 2014-03-29 DIAGNOSIS — I2510 Atherosclerotic heart disease of native coronary artery without angina pectoris: Secondary | ICD-10-CM | POA: Diagnosis not present

## 2014-03-29 DIAGNOSIS — E785 Hyperlipidemia, unspecified: Secondary | ICD-10-CM | POA: Diagnosis not present

## 2014-03-29 DIAGNOSIS — E119 Type 2 diabetes mellitus without complications: Secondary | ICD-10-CM | POA: Diagnosis not present

## 2014-03-29 DIAGNOSIS — I10 Essential (primary) hypertension: Secondary | ICD-10-CM | POA: Diagnosis not present

## 2014-03-29 DIAGNOSIS — G253 Myoclonus: Secondary | ICD-10-CM | POA: Diagnosis not present

## 2014-03-29 DIAGNOSIS — G9340 Encephalopathy, unspecified: Secondary | ICD-10-CM | POA: Diagnosis not present

## 2014-03-29 DIAGNOSIS — I5042 Chronic combined systolic (congestive) and diastolic (congestive) heart failure: Secondary | ICD-10-CM | POA: Diagnosis not present

## 2014-03-29 DIAGNOSIS — N400 Enlarged prostate without lower urinary tract symptoms: Secondary | ICD-10-CM | POA: Diagnosis not present

## 2014-03-29 DIAGNOSIS — M4802 Spinal stenosis, cervical region: Secondary | ICD-10-CM | POA: Diagnosis not present

## 2014-03-31 DIAGNOSIS — E785 Hyperlipidemia, unspecified: Secondary | ICD-10-CM | POA: Diagnosis not present

## 2014-03-31 DIAGNOSIS — I252 Old myocardial infarction: Secondary | ICD-10-CM | POA: Diagnosis not present

## 2014-03-31 DIAGNOSIS — M4802 Spinal stenosis, cervical region: Secondary | ICD-10-CM | POA: Diagnosis not present

## 2014-03-31 DIAGNOSIS — E119 Type 2 diabetes mellitus without complications: Secondary | ICD-10-CM | POA: Diagnosis not present

## 2014-03-31 DIAGNOSIS — I2510 Atherosclerotic heart disease of native coronary artery without angina pectoris: Secondary | ICD-10-CM | POA: Diagnosis not present

## 2014-03-31 DIAGNOSIS — G253 Myoclonus: Secondary | ICD-10-CM | POA: Diagnosis not present

## 2014-03-31 DIAGNOSIS — N400 Enlarged prostate without lower urinary tract symptoms: Secondary | ICD-10-CM | POA: Diagnosis not present

## 2014-03-31 DIAGNOSIS — G9340 Encephalopathy, unspecified: Secondary | ICD-10-CM | POA: Diagnosis not present

## 2014-03-31 DIAGNOSIS — I10 Essential (primary) hypertension: Secondary | ICD-10-CM | POA: Diagnosis not present

## 2014-03-31 DIAGNOSIS — I5042 Chronic combined systolic (congestive) and diastolic (congestive) heart failure: Secondary | ICD-10-CM | POA: Diagnosis not present

## 2014-04-03 ENCOUNTER — Telehealth: Payer: Self-pay | Admitting: *Deleted

## 2014-04-03 ENCOUNTER — Telehealth: Payer: Self-pay

## 2014-04-03 DIAGNOSIS — I5042 Chronic combined systolic (congestive) and diastolic (congestive) heart failure: Secondary | ICD-10-CM | POA: Diagnosis not present

## 2014-04-03 DIAGNOSIS — I10 Essential (primary) hypertension: Secondary | ICD-10-CM | POA: Diagnosis not present

## 2014-04-03 DIAGNOSIS — G9340 Encephalopathy, unspecified: Secondary | ICD-10-CM | POA: Diagnosis not present

## 2014-04-03 DIAGNOSIS — M4802 Spinal stenosis, cervical region: Secondary | ICD-10-CM | POA: Diagnosis not present

## 2014-04-03 DIAGNOSIS — I252 Old myocardial infarction: Secondary | ICD-10-CM | POA: Diagnosis not present

## 2014-04-03 DIAGNOSIS — E119 Type 2 diabetes mellitus without complications: Secondary | ICD-10-CM | POA: Diagnosis not present

## 2014-04-03 DIAGNOSIS — G253 Myoclonus: Secondary | ICD-10-CM | POA: Diagnosis not present

## 2014-04-03 DIAGNOSIS — E785 Hyperlipidemia, unspecified: Secondary | ICD-10-CM | POA: Diagnosis not present

## 2014-04-03 DIAGNOSIS — N400 Enlarged prostate without lower urinary tract symptoms: Secondary | ICD-10-CM | POA: Diagnosis not present

## 2014-04-03 DIAGNOSIS — I2510 Atherosclerotic heart disease of native coronary artery without angina pectoris: Secondary | ICD-10-CM | POA: Diagnosis not present

## 2014-04-03 NOTE — Telephone Encounter (Signed)
Lab Results  Component Value Date   HGBA1C 7.0* 01/28/2014    Yes let's start glimepiride but I recommend starting at 2mg  - take 1/2 tablet every morning with breakfast.

## 2014-04-03 NOTE — Telephone Encounter (Signed)
Daughter says the patient was taken off Metformin by Dr. Damita Dunnings and his diarrhea has gotten better but now his blood sugars are running in the 250's to 300's range.  Daughter says patient was on Glimepiride 4 mg previously and still has some on hand.  Should he take this rather than the Metformin?

## 2014-04-03 NOTE — Telephone Encounter (Signed)
[name] OT with [facility] left v/m requesting verbal orders to extend home health OT orders 2 x a week for 2 more weeks.Please advise.

## 2014-04-04 MED ORDER — GLIMEPIRIDE 4 MG PO TABS: 2.0000 mg | ORAL_TABLET | Freq: Every day | ORAL | Status: DC

## 2014-04-04 NOTE — Telephone Encounter (Signed)
Please give the order.  Thanks.   

## 2014-04-04 NOTE — Telephone Encounter (Signed)
Spoke with patient and advised him as well as left message on daughter's VM.

## 2014-04-04 NOTE — Telephone Encounter (Signed)
Agree with Dr. [name] Level. Please update me with sugar readings in about 1 week.  Metformin added to intolerance list.  Thanks .

## 2014-04-04 NOTE — Telephone Encounter (Signed)
Left detailed message on voicemail of Marlowe Kays OT with [facility].

## 2014-04-04 NOTE — Telephone Encounter (Signed)
Message left notifying patient's daughter.  

## 2014-04-05 ENCOUNTER — Telehealth: Payer: Self-pay | Admitting: *Deleted

## 2014-04-05 DIAGNOSIS — I2510 Atherosclerotic heart disease of native coronary artery without angina pectoris: Secondary | ICD-10-CM | POA: Diagnosis not present

## 2014-04-05 DIAGNOSIS — I10 Essential (primary) hypertension: Secondary | ICD-10-CM | POA: Diagnosis not present

## 2014-04-05 DIAGNOSIS — I5042 Chronic combined systolic (congestive) and diastolic (congestive) heart failure: Secondary | ICD-10-CM | POA: Diagnosis not present

## 2014-04-05 DIAGNOSIS — G253 Myoclonus: Secondary | ICD-10-CM | POA: Diagnosis not present

## 2014-04-05 DIAGNOSIS — E785 Hyperlipidemia, unspecified: Secondary | ICD-10-CM | POA: Diagnosis not present

## 2014-04-05 DIAGNOSIS — I252 Old myocardial infarction: Secondary | ICD-10-CM | POA: Diagnosis not present

## 2014-04-05 DIAGNOSIS — N400 Enlarged prostate without lower urinary tract symptoms: Secondary | ICD-10-CM | POA: Diagnosis not present

## 2014-04-05 DIAGNOSIS — G9340 Encephalopathy, unspecified: Secondary | ICD-10-CM | POA: Diagnosis not present

## 2014-04-05 DIAGNOSIS — E119 Type 2 diabetes mellitus without complications: Secondary | ICD-10-CM | POA: Diagnosis not present

## 2014-04-05 DIAGNOSIS — M4802 Spinal stenosis, cervical region: Secondary | ICD-10-CM | POA: Diagnosis not present

## 2014-04-05 NOTE — Telephone Encounter (Signed)
Patient and pharmacy notified as instructed by telephone.

## 2014-04-05 NOTE — Telephone Encounter (Signed)
Please notify pt, pharmacy.  thanks.

## 2014-04-05 NOTE — Telephone Encounter (Signed)
Received a fax from [facility] that clonazepam 0.5mg  was approved at 4.5 tablets per day dosing through 02/10/15. Approval letter in Dr. Josefine Class inbox.

## 2014-04-06 DIAGNOSIS — I2510 Atherosclerotic heart disease of native coronary artery without angina pectoris: Secondary | ICD-10-CM | POA: Diagnosis not present

## 2014-04-06 DIAGNOSIS — N400 Enlarged prostate without lower urinary tract symptoms: Secondary | ICD-10-CM | POA: Diagnosis not present

## 2014-04-06 DIAGNOSIS — G253 Myoclonus: Secondary | ICD-10-CM | POA: Diagnosis not present

## 2014-04-06 DIAGNOSIS — I252 Old myocardial infarction: Secondary | ICD-10-CM | POA: Diagnosis not present

## 2014-04-06 DIAGNOSIS — I5042 Chronic combined systolic (congestive) and diastolic (congestive) heart failure: Secondary | ICD-10-CM | POA: Diagnosis not present

## 2014-04-06 DIAGNOSIS — M4802 Spinal stenosis, cervical region: Secondary | ICD-10-CM | POA: Diagnosis not present

## 2014-04-06 DIAGNOSIS — E119 Type 2 diabetes mellitus without complications: Secondary | ICD-10-CM | POA: Diagnosis not present

## 2014-04-06 DIAGNOSIS — I10 Essential (primary) hypertension: Secondary | ICD-10-CM | POA: Diagnosis not present

## 2014-04-06 DIAGNOSIS — G9340 Encephalopathy, unspecified: Secondary | ICD-10-CM | POA: Diagnosis not present

## 2014-04-06 DIAGNOSIS — E785 Hyperlipidemia, unspecified: Secondary | ICD-10-CM | POA: Diagnosis not present

## 2014-04-09 DIAGNOSIS — N400 Enlarged prostate without lower urinary tract symptoms: Secondary | ICD-10-CM | POA: Diagnosis not present

## 2014-04-09 DIAGNOSIS — M4802 Spinal stenosis, cervical region: Secondary | ICD-10-CM | POA: Diagnosis not present

## 2014-04-09 DIAGNOSIS — G9340 Encephalopathy, unspecified: Secondary | ICD-10-CM | POA: Diagnosis not present

## 2014-04-09 DIAGNOSIS — I2510 Atherosclerotic heart disease of native coronary artery without angina pectoris: Secondary | ICD-10-CM | POA: Diagnosis not present

## 2014-04-09 DIAGNOSIS — E119 Type 2 diabetes mellitus without complications: Secondary | ICD-10-CM | POA: Diagnosis not present

## 2014-04-09 DIAGNOSIS — E785 Hyperlipidemia, unspecified: Secondary | ICD-10-CM | POA: Diagnosis not present

## 2014-04-09 DIAGNOSIS — I5042 Chronic combined systolic (congestive) and diastolic (congestive) heart failure: Secondary | ICD-10-CM | POA: Diagnosis not present

## 2014-04-09 DIAGNOSIS — G253 Myoclonus: Secondary | ICD-10-CM | POA: Diagnosis not present

## 2014-04-09 DIAGNOSIS — I10 Essential (primary) hypertension: Secondary | ICD-10-CM | POA: Diagnosis not present

## 2014-04-09 DIAGNOSIS — I252 Old myocardial infarction: Secondary | ICD-10-CM | POA: Diagnosis not present

## 2014-04-11 DIAGNOSIS — E785 Hyperlipidemia, unspecified: Secondary | ICD-10-CM | POA: Diagnosis not present

## 2014-04-11 DIAGNOSIS — G253 Myoclonus: Secondary | ICD-10-CM | POA: Diagnosis not present

## 2014-04-11 DIAGNOSIS — I10 Essential (primary) hypertension: Secondary | ICD-10-CM | POA: Diagnosis not present

## 2014-04-11 DIAGNOSIS — I5042 Chronic combined systolic (congestive) and diastolic (congestive) heart failure: Secondary | ICD-10-CM | POA: Diagnosis not present

## 2014-04-11 DIAGNOSIS — E119 Type 2 diabetes mellitus without complications: Secondary | ICD-10-CM | POA: Diagnosis not present

## 2014-04-11 DIAGNOSIS — I2510 Atherosclerotic heart disease of native coronary artery without angina pectoris: Secondary | ICD-10-CM | POA: Diagnosis not present

## 2014-04-11 DIAGNOSIS — N400 Enlarged prostate without lower urinary tract symptoms: Secondary | ICD-10-CM | POA: Diagnosis not present

## 2014-04-11 DIAGNOSIS — M4802 Spinal stenosis, cervical region: Secondary | ICD-10-CM | POA: Diagnosis not present

## 2014-04-11 DIAGNOSIS — G9340 Encephalopathy, unspecified: Secondary | ICD-10-CM | POA: Diagnosis not present

## 2014-04-11 DIAGNOSIS — I252 Old myocardial infarction: Secondary | ICD-10-CM | POA: Diagnosis not present

## 2014-04-12 DIAGNOSIS — I5042 Chronic combined systolic (congestive) and diastolic (congestive) heart failure: Secondary | ICD-10-CM | POA: Diagnosis not present

## 2014-04-12 DIAGNOSIS — G253 Myoclonus: Secondary | ICD-10-CM | POA: Diagnosis not present

## 2014-04-12 DIAGNOSIS — E119 Type 2 diabetes mellitus without complications: Secondary | ICD-10-CM | POA: Diagnosis not present

## 2014-04-12 DIAGNOSIS — M4802 Spinal stenosis, cervical region: Secondary | ICD-10-CM | POA: Diagnosis not present

## 2014-04-12 DIAGNOSIS — I10 Essential (primary) hypertension: Secondary | ICD-10-CM | POA: Diagnosis not present

## 2014-04-12 DIAGNOSIS — I2510 Atherosclerotic heart disease of native coronary artery without angina pectoris: Secondary | ICD-10-CM | POA: Diagnosis not present

## 2014-04-12 DIAGNOSIS — I252 Old myocardial infarction: Secondary | ICD-10-CM | POA: Diagnosis not present

## 2014-04-12 DIAGNOSIS — G9340 Encephalopathy, unspecified: Secondary | ICD-10-CM | POA: Diagnosis not present

## 2014-04-12 DIAGNOSIS — N400 Enlarged prostate without lower urinary tract symptoms: Secondary | ICD-10-CM | POA: Diagnosis not present

## 2014-04-12 DIAGNOSIS — E785 Hyperlipidemia, unspecified: Secondary | ICD-10-CM | POA: Diagnosis not present

## 2014-04-13 DIAGNOSIS — I252 Old myocardial infarction: Secondary | ICD-10-CM | POA: Diagnosis not present

## 2014-04-13 DIAGNOSIS — E785 Hyperlipidemia, unspecified: Secondary | ICD-10-CM | POA: Diagnosis not present

## 2014-04-13 DIAGNOSIS — I10 Essential (primary) hypertension: Secondary | ICD-10-CM | POA: Diagnosis not present

## 2014-04-13 DIAGNOSIS — E119 Type 2 diabetes mellitus without complications: Secondary | ICD-10-CM | POA: Diagnosis not present

## 2014-04-13 DIAGNOSIS — I5042 Chronic combined systolic (congestive) and diastolic (congestive) heart failure: Secondary | ICD-10-CM | POA: Diagnosis not present

## 2014-04-13 DIAGNOSIS — G253 Myoclonus: Secondary | ICD-10-CM | POA: Diagnosis not present

## 2014-04-13 DIAGNOSIS — I2510 Atherosclerotic heart disease of native coronary artery without angina pectoris: Secondary | ICD-10-CM | POA: Diagnosis not present

## 2014-04-13 DIAGNOSIS — G9340 Encephalopathy, unspecified: Secondary | ICD-10-CM | POA: Diagnosis not present

## 2014-04-13 DIAGNOSIS — M4802 Spinal stenosis, cervical region: Secondary | ICD-10-CM | POA: Diagnosis not present

## 2014-04-13 DIAGNOSIS — N400 Enlarged prostate without lower urinary tract symptoms: Secondary | ICD-10-CM | POA: Diagnosis not present

## 2014-04-17 DIAGNOSIS — G9340 Encephalopathy, unspecified: Secondary | ICD-10-CM | POA: Diagnosis not present

## 2014-04-17 DIAGNOSIS — I5042 Chronic combined systolic (congestive) and diastolic (congestive) heart failure: Secondary | ICD-10-CM | POA: Diagnosis not present

## 2014-04-17 DIAGNOSIS — I252 Old myocardial infarction: Secondary | ICD-10-CM | POA: Diagnosis not present

## 2014-04-17 DIAGNOSIS — N400 Enlarged prostate without lower urinary tract symptoms: Secondary | ICD-10-CM | POA: Diagnosis not present

## 2014-04-17 DIAGNOSIS — I10 Essential (primary) hypertension: Secondary | ICD-10-CM | POA: Diagnosis not present

## 2014-04-17 DIAGNOSIS — M4802 Spinal stenosis, cervical region: Secondary | ICD-10-CM | POA: Diagnosis not present

## 2014-04-17 DIAGNOSIS — E119 Type 2 diabetes mellitus without complications: Secondary | ICD-10-CM | POA: Diagnosis not present

## 2014-04-17 DIAGNOSIS — G253 Myoclonus: Secondary | ICD-10-CM | POA: Diagnosis not present

## 2014-04-17 DIAGNOSIS — I2510 Atherosclerotic heart disease of native coronary artery without angina pectoris: Secondary | ICD-10-CM | POA: Diagnosis not present

## 2014-04-17 DIAGNOSIS — E785 Hyperlipidemia, unspecified: Secondary | ICD-10-CM | POA: Diagnosis not present

## 2014-04-20 DIAGNOSIS — M4802 Spinal stenosis, cervical region: Secondary | ICD-10-CM | POA: Diagnosis not present

## 2014-04-20 DIAGNOSIS — I5042 Chronic combined systolic (congestive) and diastolic (congestive) heart failure: Secondary | ICD-10-CM | POA: Diagnosis not present

## 2014-04-20 DIAGNOSIS — I2510 Atherosclerotic heart disease of native coronary artery without angina pectoris: Secondary | ICD-10-CM | POA: Diagnosis not present

## 2014-04-20 DIAGNOSIS — G253 Myoclonus: Secondary | ICD-10-CM | POA: Diagnosis not present

## 2014-04-20 DIAGNOSIS — E119 Type 2 diabetes mellitus without complications: Secondary | ICD-10-CM | POA: Diagnosis not present

## 2014-04-20 DIAGNOSIS — N400 Enlarged prostate without lower urinary tract symptoms: Secondary | ICD-10-CM | POA: Diagnosis not present

## 2014-04-20 DIAGNOSIS — E785 Hyperlipidemia, unspecified: Secondary | ICD-10-CM | POA: Diagnosis not present

## 2014-04-20 DIAGNOSIS — I252 Old myocardial infarction: Secondary | ICD-10-CM | POA: Diagnosis not present

## 2014-04-20 DIAGNOSIS — G9340 Encephalopathy, unspecified: Secondary | ICD-10-CM | POA: Diagnosis not present

## 2014-04-20 DIAGNOSIS — I10 Essential (primary) hypertension: Secondary | ICD-10-CM | POA: Diagnosis not present

## 2014-04-24 DIAGNOSIS — E785 Hyperlipidemia, unspecified: Secondary | ICD-10-CM | POA: Diagnosis not present

## 2014-04-24 DIAGNOSIS — I10 Essential (primary) hypertension: Secondary | ICD-10-CM | POA: Diagnosis not present

## 2014-04-24 DIAGNOSIS — G9340 Encephalopathy, unspecified: Secondary | ICD-10-CM | POA: Diagnosis not present

## 2014-04-24 DIAGNOSIS — I2510 Atherosclerotic heart disease of native coronary artery without angina pectoris: Secondary | ICD-10-CM | POA: Diagnosis not present

## 2014-04-24 DIAGNOSIS — I5042 Chronic combined systolic (congestive) and diastolic (congestive) heart failure: Secondary | ICD-10-CM | POA: Diagnosis not present

## 2014-04-24 DIAGNOSIS — I252 Old myocardial infarction: Secondary | ICD-10-CM | POA: Diagnosis not present

## 2014-04-24 DIAGNOSIS — G253 Myoclonus: Secondary | ICD-10-CM | POA: Diagnosis not present

## 2014-04-24 DIAGNOSIS — E119 Type 2 diabetes mellitus without complications: Secondary | ICD-10-CM | POA: Diagnosis not present

## 2014-04-24 DIAGNOSIS — M4802 Spinal stenosis, cervical region: Secondary | ICD-10-CM | POA: Diagnosis not present

## 2014-04-24 DIAGNOSIS — N400 Enlarged prostate without lower urinary tract symptoms: Secondary | ICD-10-CM | POA: Diagnosis not present

## 2014-04-26 DIAGNOSIS — I5042 Chronic combined systolic (congestive) and diastolic (congestive) heart failure: Secondary | ICD-10-CM | POA: Diagnosis not present

## 2014-04-26 DIAGNOSIS — I2510 Atherosclerotic heart disease of native coronary artery without angina pectoris: Secondary | ICD-10-CM | POA: Diagnosis not present

## 2014-04-26 DIAGNOSIS — E785 Hyperlipidemia, unspecified: Secondary | ICD-10-CM | POA: Diagnosis not present

## 2014-04-26 DIAGNOSIS — E119 Type 2 diabetes mellitus without complications: Secondary | ICD-10-CM | POA: Diagnosis not present

## 2014-04-26 DIAGNOSIS — G9340 Encephalopathy, unspecified: Secondary | ICD-10-CM | POA: Diagnosis not present

## 2014-04-26 DIAGNOSIS — I10 Essential (primary) hypertension: Secondary | ICD-10-CM | POA: Diagnosis not present

## 2014-04-26 DIAGNOSIS — G253 Myoclonus: Secondary | ICD-10-CM | POA: Diagnosis not present

## 2014-04-26 DIAGNOSIS — M4802 Spinal stenosis, cervical region: Secondary | ICD-10-CM | POA: Diagnosis not present

## 2014-04-26 DIAGNOSIS — I252 Old myocardial infarction: Secondary | ICD-10-CM | POA: Diagnosis not present

## 2014-04-26 DIAGNOSIS — N400 Enlarged prostate without lower urinary tract symptoms: Secondary | ICD-10-CM | POA: Diagnosis not present

## 2014-05-02 ENCOUNTER — Other Ambulatory Visit: Payer: Self-pay | Admitting: Family Medicine

## 2014-05-02 DIAGNOSIS — M1712 Unilateral primary osteoarthritis, left knee: Secondary | ICD-10-CM | POA: Diagnosis not present

## 2014-05-02 DIAGNOSIS — M1711 Unilateral primary osteoarthritis, right knee: Secondary | ICD-10-CM | POA: Diagnosis not present

## 2014-05-02 NOTE — Telephone Encounter (Signed)
Electronic refill request. Last Filled:    60 tablet 1 RF on 03/26/2014  Patient advised.

## 2014-05-03 NOTE — Telephone Encounter (Signed)
Sent. Thanks.   

## 2014-05-04 ENCOUNTER — Encounter: Payer: Self-pay | Admitting: Neurology

## 2014-05-04 ENCOUNTER — Ambulatory Visit (INDEPENDENT_AMBULATORY_CARE_PROVIDER_SITE_OTHER): Payer: Medicare Other | Admitting: Neurology

## 2014-05-04 VITALS — BP 136/88 | HR 60 | Resp 16 | Wt 240.0 lb

## 2014-05-04 DIAGNOSIS — G253 Myoclonus: Secondary | ICD-10-CM | POA: Diagnosis not present

## 2014-05-04 DIAGNOSIS — R251 Tremor, unspecified: Secondary | ICD-10-CM | POA: Diagnosis not present

## 2014-05-04 NOTE — Progress Notes (Signed)
NEUROLOGY CONSULTATION NOTE  [name] MRN: [id number] DOB: 1934-02-16  Referring provider: Dr. [name] Primary care provider: Dr. [name]  Reason for consult:  Pseudoseizure versus frontal lobe seizure  Dear Dr Damita Dunnings:  Thank you for your kind referral of [name] for consultation of the above symptoms. Although his history is well known to you, please allow me to reiterate it for the purpose of our medical record. The patient was accompanied to the clinic by his daughter who also provides collateral information. Records and images were personally reviewed where available.  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 Records from [facility] and [facility] were reviewed. He was in his usual state of health until 12/15/13 while walking the bathroom when he felt dizzy. He was brought to [facility] were MRI brain was unremarkable, with moderate generalized atrophy. CTA head and neck unremarkable. Orthostatic vital signs were negative and he was started on Valium for vertigo. He was discharged to rehab for vestibular therapy. At the SNF, he had a fall on 11/23 due to dizziness. His sister went to visit him after the fall, when he had an episode with arms up, eyes rolled back. His head was going back and forth sidebending left and right with shaking for 10-15 minutes. He was able to communicate although slowly during the event, per records, no loss of consciousness. No post-event confusion. He is amnestic of this episode. He was re-admitted at [facility] where TTE done showed EF 35-40%, mild LVH, diffuse hypokinesis. EEG was normal, 3 episodes of wooziness and 5 episodes of jerking/staring did not show EEG change. His family wanted to transfer to [facility], he was transferred on 11/27. During his stay, repeat MRI brain did not show acute changes. Orthostatic  negative. Labs done showed normal B12 (221), CK (69), TSH (4.036). He was noted to have episodes of shaking when going from lying to sitting position, diagnosed as startle myoclonus. He was started on clonazepam 0.5mg  BID with note of improvement on discharge. He had a serum paraneoplastic panel sent, results unavailable for review. He did well at the SNF for a couple of weeks, until the day before he was going to be discharged home, when he had 2 bouts of tremors. He refused to go to the ER then the next day had one in the shower. He was brought back to [facility] on 12/17 where clonazepam dose was increased and Keppra was added. He was discharged home after 2 days, then sent back that same day due to continued tremors. Keppra was discontinued, Depakote added. He has not had any further tremors since December.   He is now back home living alone. 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 He denies any symptoms, no headaches, dizziness, he does not feel unbalanced. He denies any diplopia, dysarthria, dysphagia, neck/back pain, focal numbness/tingling/weakness, bowel/bladder dysfunction. No family history of similar symptoms.     Laboratory Data: Lab Results  Component Value Date   WBC 8.4 03/21/2014   HGB 15.5 03/21/2014   HCT 46.4 03/21/2014   MCV 93.8 03/21/2014   PLT 322.0 03/21/2014     Chemistry      Component Value Date/Time   NA 137 03/21/2014 1724   K 4.0 03/21/2014 1724   CL 101 03/21/2014 1724   CO2 28 03/21/2014 1724   BUN 29* 03/21/2014  1724   CREATININE 1.09 03/21/2014 1724      Component Value Date/Time   CALCIUM 9.9 03/21/2014 1724   ALKPHOS 51 03/21/2014 1724   AST 18 03/21/2014 1724   ALT 29 03/21/2014 1724   BILITOT 0.3 03/21/2014 1724     Lab Results  Component Value Date   TSH 2.530 01/29/2014   Lab Results  Component Value Date   KGMWNUUV25 366 01/26/2014   I personally reviewed MRI brain without contrast done 12/15/13 which showed moderate generalized atrophy, no  acute changes. MRI C-spine without contrast showed mild spinal stenosis at C2-3, C3-4, stable and multifactorial with some ossification of the posterior longitudinal ligament suspected (OPLL). Multifactorial moderate or severe neural foraminal stenosis at bilateral C4, right C5, bilateral C6. Posterior element ankylosis at C4-5 with severe facet hypertrophy on the right. Mild multifactorial upper thoracic spinal stenosis at T2-3.  PAST MEDICAL HISTORY: Past Medical History  Diagnosis Date  . CAD (coronary artery disease)   . History of syncope   . HTN (hypertension)   . Borderline hyperlipidemia   . History of gastritis   . Diverticulosis of colon   . Colon polyps   . Hypertrophy of prostate with urinary obstruction and other lower urinary tract symptoms (LUTS)   . History of pyelonephritis   . DJD (degenerative joint disease)   . Anxiety   . DM (diabetes mellitus)     Adult onset  . 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 History of kidney stones   . Myoclonus     PAST SURGICAL HISTORY: Past Surgical History  Procedure Laterality Date  . Appendectomy    . Cataract extraction      RIGHT EYE  . Knee surgery      bilateral ARTHROSCOPY X2 TO EACH KNEE  . Shoulder surgery  04/2005    left by Dr. Berenice Primas  . 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 S/p cysto & stents for kidnedy stone 11/08 by dr. Reece Agar    . Cystoscopy  04/17/11    stent placed  . Cystoscopy w/ ureteral stent placement  04/17/2011    Procedure: CYSTOSCOPY WITH RETROGRADE PYELOGRAM/URETERAL STENT PLACEMENT;  Surgeon: Hanley Ben, MD;  Location: [facility];  Service: Urology;  Laterality: Left;  . Cystoscopy with retrograde pyelogram, ureteroscopy and stent placement Left 04/18/2013    Procedure: CYSTOSCOPY WITH LEFT RETROGRADE PYELOGRAM, LEFT URETEROSCOPY AND LASER LITHOTRIPSY LEFT STENT  PLACEMENT;  Surgeon: Dutch Gray, MD;  Location: [facility];  Service: Urology;  Laterality: Left;  . Holmium laser application Left 05/14/345    Procedure: HOLMIUM LASER APPLICATION;  Surgeon: Dutch Gray, MD;  Location: [facility];  Service: Urology;  Laterality: Left;  . Cholecystectomy      MEDICATIONS: Current Outpatient Prescriptions on File Prior to Visit  Medication Sig Dispense Refill  . amLODipine (NORVASC) 10 MG tablet Take 1 tablet (10 mg total) by mouth every morning. 90 tablet 3  . aspirin EC 81 MG tablet Take 81 mg by mouth every morning.     . carvedilol (COREG) 12.5 MG tablet Take 1 tablet (12.5 mg total) by mouth 2 (two) times daily with a meal. 60 tablet 5  . clonazePAM (KLONOPIN) 0.5 MG tablet Take 1.5 tablets (0.75 mg total) by mouth 3 (three) times daily. 135 tablet 1  . divalproex (DEPAKOTE)  500 MG DR tablet TAKE 1 TABLET BY MOUTH 2 TIMES DAILY. 60 tablet 2  . glimepiride (AMARYL) 4 MG tablet Take 0.5 tablets (2 mg total) by mouth daily before breakfast.    . hydrALAZINE (APRESOLINE) 50 MG tablet Take 1 tablet (50 mg total) by mouth 3 (three) times daily. 90 tablet 5  . Insulin Glargine ([name] SOLOSTAR) 300 UNIT/ML SOPN Inject 12 Units into the skin at bedtime.    Marland Kitchen lisinopril-hydrochlorothiazide (PRINZIDE,ZESTORETIC) 20-25 MG per tablet Take 1 tablet by mouth every morning.     . potassium chloride SA (K-DUR,KLOR-CON) 20 MEQ tablet Take 20 mEq by mouth once.    . tamsulosin (FLOMAX) 0.4 MG CAPS capsule Take 0.4 mg by mouth daily after supper.     No current facility-administered medications on file prior to visit.    ALLERGIES: Allergies  Allergen Reactions  . Codeine Phosphate Nausea Only    Can take with food and usually doesn't cause nausea  . Morphine Nausea Only    Can take with food and usually doesn't cause nausea  . Metformin And Related Diarrhea    FAMILY HISTORY: Family History  Problem Relation Age of Onset  . Heart attack Mother   . Dementia Brother     . Colon cancer Neg Hx   . Prostate cancer Neg Hx     SOCIAL HISTORY: History   Social History  . Marital Status: Widowed    Spouse Name: N/A  . Number of Children: 2  . Years of Education: N/A   Occupational History  . Retired    Social History Main Topics  . Smoking status: Never Smoker   . Smokeless tobacco: Never Used  . Alcohol Use: No  . Drug Use: No  . Sexual Activity: Not on file   Other Topics Concern  . Not on file   Social History Narrative   Widowed 2006   2 kids   Enjoys hunting and fishing   Retired from [employer] and [employer] work    REVIEW OF SYSTEMS: Constitutional: No fevers, chills, or sweats, no generalized fatigue, change in appetite Eyes: No visual changes, double vision, eye pain Ear, nose and throat: No hearing loss, ear pain, nasal congestion, sore throat Cardiovascular: No chest pain, palpitations Respiratory:  No shortness of breath at rest or with exertion, wheezes GastrointestinaI: No nausea, vomiting, diarrhea, abdominal pain, fecal incontinence Genitourinary:  No dysuria, urinary retention or frequency Musculoskeletal:  No neck pain, back pain Integumentary: No rash, pruritus, skin lesions Neurological: as above Psychiatric: No depression, insomnia, anxiety Endocrine: No palpitations, fatigue, diaphoresis, mood swings, change in appetite, change in weight, increased thirst Hematologic/Lymphatic:  No anemia, purpura, petechiae. Allergic/Immunologic: no itchy/runny eyes, nasal congestion, recent allergic reactions, rashes  PHYSICAL EXAM: Filed Vitals:   05/04/14 0932  BP: 136/88  Pulse: 60  Resp: 16   General: No acute distress Head:  Normocephalic/atraumatic Eyes: Fundoscopic exam shows bilateral sharp discs, no vessel changes, exudates, or hemorrhages Neck: supple, no paraspinal tenderness, full range of motion Back: No paraspinal tenderness Heart: regular rate and rhythm Lungs: Clear to auscultation bilaterally. Vascular: No  carotid bruits. Skin/Extremities: No rash, no edema Neurological Exam: Mental status: alert and oriented to person, place, and time, no dysarthria or aphasia, Fund of knowledge is appropriate.  Recent and remote memory are intact.  Attention and concentration are normal. Unable to spell WORLD forward and backward, unable to do serial 7s.   Able to name objects and repeat phrases.  Difficulty drawing intersecting  pentagons.  MMSE - Mini Mental State Exam 05/04/2014  Orientation to time 4  Orientation to Place 5  Registration 3  Attention/ Calculation 0  Recall 3  Language- name 2 objects 2  Language- repeat 1  Language- follow 3 step command 3  Language- read & follow direction 1  Write a sentence 1  Copy design 0  Total score 23   Cranial nerves: CN I: not tested CN II: pupils equal, round and reactive to light, visual fields intact, fundi unremarkable. CN III, IV, VI:  full range of motion, no nystagmus, no ptosis CN V: facial sensation intact CN VII: upper and lower face symmetric CN VIII: hearing intact to finger rub CN IX, X: gag intact, uvula midline CN XI: sternocleidomastoid and trapezius muscles intact CN XII: tongue midline Bulk & Tone: +cogwheeling on the left wrist, no fasciculations. Motor: 5/5 throughout with no pronator drift. Sensation: intact to light touch, cold, pin, vibration and joint position sense.  No extinction to double simultaneous stimulation.  Romberg test negative Deep Tendon Reflexes: +2 throughout, no ankle clonus Plantar responses: downgoing bilaterally Cerebellar: no incoordination on finger to nose, heel to shin. No dysdiadochokinesia Gait: slow and cautious, better with walker, no ataxia, able to tandem walk adequately. Tremor: no resting or postural tremor. During MMSE when asked to do writing, he had brief left hand tremor noted.    IMPRESSION: This is a 80 year old right-handed man with a history of hypertension, CAD, CHF, DM, presenting for  hospital follow-up with a diagnosis of startle myoclonus. He had sudden onset dizziness, followed by recurrent tremors, noted to be triggered with sitting or standing up, and was diagnosed as startle myoclonus at [facility]. MRI brain, prolonged EEG which captured these episodes, were unremarkable. Bloodwork unremarkable. His neurological exam today shows cogwheeling on the left, he had very brief tremor of the left hand during the MMSE. MMSE today 23/30, indicating mild dementia. I wonder about a neurodegenerative process causing myoclonus, such as CBD, MSA, or DLB. Paraneoplastic panel had been sent at [facility], results requested for review. He has had no further significant tremors since Depakote was added in December. Continue current medications. He was instructed to use his walker at all times. He is anxious to return to driving, we discussed that after an episode of loss of awareness/consciousness, one should not drive until 6 months event-free. He will follow-up in 3 months.   Thank you for allowing me to participate in the care of this patient. Please do not hesitate to call for any questions or concerns.   Ellouise Newer, M.D.  CC: Dr. Damita Dunnings

## 2014-05-04 NOTE — Patient Instructions (Signed)
1. Continue current doses of all your medications 2. Discuss easy fatigability with your PCP 3. Use your walker AT ALL TIMES 4. As per  driving laws, for any episode of loss of awareness, one should not drive until 6 months event-free 5. Follow-up in 3 months

## 2014-05-09 ENCOUNTER — Other Ambulatory Visit: Payer: Self-pay | Admitting: *Deleted

## 2014-05-09 MED ORDER — TAMSULOSIN HCL 0.4 MG PO CAPS: 0.4000 mg | ORAL_CAPSULE | Freq: Every day | ORAL | Status: DC

## 2014-05-09 MED ORDER — POTASSIUM CHLORIDE CRYS ER 20 MEQ PO TBCR: 20.0000 meq | EXTENDED_RELEASE_TABLET | Freq: Once | ORAL | Status: DC

## 2014-05-09 MED ORDER — DIVALPROEX SODIUM 500 MG PO DR TAB: DELAYED_RELEASE_TABLET | ORAL | Status: DC

## 2014-05-09 MED ORDER — AMLODIPINE BESYLATE 10 MG PO TABS: 10.0000 mg | ORAL_TABLET | Freq: Every morning | ORAL | Status: DC

## 2014-05-09 MED ORDER — CARVEDILOL 12.5 MG PO TABS: 12.5000 mg | ORAL_TABLET | Freq: Two times a day (BID) | ORAL | Status: DC

## 2014-05-09 NOTE — Telephone Encounter (Signed)
[facility]ed [facility]  I have sent other Rx's but not sure how many pens that is needed for 3 month supply.  Please advise.

## 2014-05-10 MED ORDER — INSULIN GLARGINE 300 UNIT/ML ~~LOC~~ SOPN: 12.0000 [IU] | PEN_INJECTOR | Freq: Every day | SUBCUTANEOUS | Status: DC

## 2014-05-10 NOTE — Telephone Encounter (Signed)
Sent. Thanks.   

## 2014-05-11 ENCOUNTER — Other Ambulatory Visit: Payer: Self-pay | Admitting: *Deleted

## 2014-05-11 MED ORDER — POTASSIUM CHLORIDE CRYS ER 20 MEQ PO TBCR: 20.0000 meq | EXTENDED_RELEASE_TABLET | Freq: Every day | ORAL | Status: DC

## 2014-05-12 ENCOUNTER — Ambulatory Visit (INDEPENDENT_AMBULATORY_CARE_PROVIDER_SITE_OTHER): Payer: Medicare Other | Admitting: Family Medicine

## 2014-05-12 ENCOUNTER — Encounter: Payer: Self-pay | Admitting: Family Medicine

## 2014-05-12 VITALS — BP 156/84 | HR 60 | Temp 97.7°F | Wt 238.2 lb

## 2014-05-12 DIAGNOSIS — E119 Type 2 diabetes mellitus without complications: Secondary | ICD-10-CM | POA: Diagnosis not present

## 2014-05-12 DIAGNOSIS — E1159 Type 2 diabetes mellitus with other circulatory complications: Secondary | ICD-10-CM

## 2014-05-12 DIAGNOSIS — E1151 Type 2 diabetes mellitus with diabetic peripheral angiopathy without gangrene: Secondary | ICD-10-CM | POA: Diagnosis not present

## 2014-05-12 LAB — HEMOGLOBIN A1C: Hgb A1c MFr Bld: 7.3 % — ABNORMAL HIGH (ref 4.6–6.5)

## 2014-05-12 MED ORDER — GLUCOSE BLOOD VI STRP: ORAL_STRIP | Status: DC

## 2014-05-12 MED ORDER — TAMSULOSIN HCL 0.4 MG PO CAPS: 0.4000 mg | ORAL_CAPSULE | Freq: Every day | ORAL | Status: DC

## 2014-05-12 MED ORDER — POTASSIUM CHLORIDE CRYS ER 20 MEQ PO TBCR: 20.0000 meq | EXTENDED_RELEASE_TABLET | Freq: Every day | ORAL | Status: DC

## 2014-05-12 NOTE — Progress Notes (Signed)
Pre visit review using our clinic review tool, if applicable. No additional management support is needed unless otherwise documented below in the visit note.  Diabetes:  Using medications without difficulties:yes Hypoglycemic episodes: no Hyperglycemic episodes: no Feet problems: no Blood Sugars averaging: ~120s in AM eye exam within last year: done- Dr. Katy Fitch, 01/2014 No lows since his last med change.  He has used occ sliding scale (max 4 units per dose with the sliding scale), not every meal, but most days of the week.  Due for A1c.    PMH and SH reviewed  Meds, vitals, and allergies reviewed.   ROS: See HPI.  Otherwise negative.    GEN: nad, alert HEENT: mucous membranes moist NECK: supple w/o LA CV: rrr. PULM: ctab, no inc wob ABD: soft, +bs EXT: trace edema SKIN: no acute rash

## 2014-05-12 NOTE — Patient Instructions (Signed)
Don't change your meds for now.  Go to the lab on the way out.  We'll contact you with your lab report. Schedule a physical for about 6 months from now, labs ahead of time.

## 2014-05-15 NOTE — Assessment & Plan Note (Signed)
Reasonable control, continue as is for now.  He agrees.  See notes on labs.

## 2014-05-22 ENCOUNTER — Other Ambulatory Visit: Payer: Self-pay | Admitting: *Deleted

## 2014-05-22 NOTE — Telephone Encounter (Deleted)
Faxed refill request.  90 day supplies.  Last Filled:   Hydralazine  90 tablet 5 RF on 03/21/2014 locally  Last Filled:   Clonazepam  clonazePAM (KLONOPIN) 0.5 MG tablet  Medication   Date: 03/26/2014  Department: [place] [facility]  Ordering/Authorizing: Tonia Ghent, MD      Order Providers    Prescribing Provider Encounter Provider   Tonia Ghent, MD Josetta Huddle, CMA    Medication Detail      Disp Refills Start End     clonazePAM (KLONOPIN) 0.5 MG tablet 135 tablet 1 03/26/2014     Sig - Route: Take 1.5 tablets (0.75 mg total) by mouth 3 (three) times daily. - Oral    Class: Phone In     [place], [place] Encounter   Priority and Order Details

## 2014-05-23 ENCOUNTER — Other Ambulatory Visit: Payer: Self-pay | Admitting: *Deleted

## 2014-05-23 MED ORDER — HYDRALAZINE HCL 50 MG PO TABS: 50.0000 mg | ORAL_TABLET | Freq: Three times a day (TID) | ORAL | Status: DC

## 2014-05-23 MED ORDER — CLONAZEPAM 0.5 MG PO TABS: 0.7500 mg | ORAL_TABLET | Freq: Three times a day (TID) | ORAL | Status: DC

## 2014-05-23 NOTE — Telephone Encounter (Signed)
I thought I had made a note that this came from the [facility] pharmacy so it would need to be for a 90 day supply.  I don't know what happened to the note that I thought I included.  So, this Rx will probably need to be reprinted.

## 2014-05-23 NOTE — Telephone Encounter (Signed)
Done.  Thanks.  Please shred the old rx.

## 2014-05-23 NOTE — Telephone Encounter (Signed)
Klonopin printed, see if he wants it local or main order.  Thanks.

## 2014-05-23 NOTE — Telephone Encounter (Signed)
Faxed

## 2014-06-02 ENCOUNTER — Other Ambulatory Visit: Payer: Self-pay

## 2014-06-02 MED ORDER — GLIMEPIRIDE 4 MG PO TABS: 2.0000 mg | ORAL_TABLET | Freq: Every day | ORAL | Status: DC

## 2014-06-02 NOTE — Telephone Encounter (Signed)
Pts daughter request refill glimeperide [facility]. Advised done.

## 2014-06-22 ENCOUNTER — Other Ambulatory Visit: Payer: Self-pay | Admitting: *Deleted

## 2014-06-22 NOTE — Telephone Encounter (Signed)
Faxed refill request. Last Filled:    5 pen 3 RF on 05/10/2014  Sent to mail order.  This request is from [facility].  Should the patient need RF's so soon?

## 2014-06-22 NOTE — Telephone Encounter (Signed)
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   Please clarify with patient and let me know.  Thanks.

## 2014-06-23 MED ORDER — INSULIN GLARGINE 300 UNIT/ML ~~LOC~~ SOPN: 12.0000 [IU] | PEN_INJECTOR | Freq: Every day | SUBCUTANEOUS | Status: DC

## 2014-06-23 NOTE — Telephone Encounter (Signed)
Left message on patient's voicemail to return call

## 2014-06-23 NOTE — Telephone Encounter (Signed)
Phoned patient.  Patient states that his daughter takes care of this for him and gave me her number [phone] Caryl Asp).  I spoke with Joy and she says the patient is completely out of the [place].  I explained that we sent an Rx to his mail order pharmacy on 05/10/14 with refills.  Joy says the patient would not give his credit card number to the mail order pharmacy so that explains why they probably didn't send the medication.  Joy asked if we could send just one pen to [facility] now to allow time for her to see into the mail order issue.  Rx sent.

## 2014-07-26 ENCOUNTER — Other Ambulatory Visit: Payer: Self-pay | Admitting: Family Medicine

## 2014-08-03 ENCOUNTER — Other Ambulatory Visit: Payer: Self-pay | Admitting: *Deleted

## 2014-08-03 MED ORDER — LISINOPRIL-HYDROCHLOROTHIAZIDE 20-25 MG PO TABS: 1.0000 | ORAL_TABLET | Freq: Every morning | ORAL | Status: DC

## 2014-08-04 ENCOUNTER — Ambulatory Visit (INDEPENDENT_AMBULATORY_CARE_PROVIDER_SITE_OTHER): Payer: Medicare Other | Admitting: Neurology

## 2014-08-04 ENCOUNTER — Encounter: Payer: Self-pay | Admitting: Neurology

## 2014-08-04 VITALS — BP 124/60 | HR 67 | Resp 18 | Ht 72.0 in | Wt 242.0 lb

## 2014-08-04 DIAGNOSIS — R251 Tremor, unspecified: Secondary | ICD-10-CM | POA: Diagnosis not present

## 2014-08-04 NOTE — Patient Instructions (Signed)
1. Reduce Depakote 500mg : Take 1/2 tablet in AM, 1 tablet in PM 2. Continue to monitor symptoms and sleepiness with medication changes 3. As per [place] driving laws, one should stop driving after an episode of loss of consciousness, until 6 months event-free 4. Use your walker for balance 5. Follow-up in 3 months, call for any changes

## 2014-08-04 NOTE — Progress Notes (Signed)
NEUROLOGY FOLLOW UP OFFICE NOTE  [name] [id number]  HISTORY OF PRESENT ILLNESS: I had the pleasure of seeing [name] in follow-up in the neurology clinic on 08/04/2014.  The patient was last seen 3 months ago for recurrent tremors of unclear etiology, possibly due to neurodegenerative disease. He is again accompanied by his daughter who helps supplement the history today. Since his last visit, he has been doing very well, with no further episodes of dizziness, loss of consciousness, or jerking/shaking. He is tolerating Depakote 500mg  BID, but expresses concern about daytime drowsiness. He is also taking clonazepam TID. He denies any headaches, dizziness, diplopia,dysarthria, dysphagia, neck/back pain, focal numbness/tingling/weakness, bowel/bladder dysfunction.He feels his memory is fine, his daughter reports the same. She fills his pillbox every week, and denies any pills missed. He denies any falls, uses the walker for longer distances.  HPI: This is a 80 yo RH man with a history of hypertension, CAD, CHF, DM in his usual state of health until 12/15/13 while walking the bathroom when he felt dizzy. He was brought to [facility] were MRI brain was unremarkable, with moderate generalized atrophy. CTA head and neck unremarkable. Orthostatic vital signs were negative and he was started on Valium for vertigo. He was discharged to rehab for vestibular therapy. At the SNF, he had a fall on 11/23 due to dizziness. His sister went to visit him after the fall, when he had an episode with arms up, eyes rolled back. His head was going back and forth sidebending left and right with shaking for 10-15 minutes. He was able to communicate although slowly during the event, per records, no loss of consciousness. No post-event confusion. He is amnestic of this episode. He was re-admitted at [facility] where TTE done showed EF 35-40%, mild LVH, diffuse hypokinesis. EEG was normal, 3 episodes of wooziness and 5 episodes of  jerking/staring did not show EEG change. His family wanted to transfer to [facility], he was transferred on 01/06/14. During his stay, repeat MRI brain did not show acute changes. Orthostatic negative. Labs done showed normal B12 (221), CK (69), TSH (4.036). He was noted to have episodes of shaking when going from lying to sitting position, diagnosed as startle myoclonus. He was started on clonazepam 0.5mg  BID with note of improvement on discharge. He had a serum paraneoplastic panel sent, results unavailable for review. He did well at the SNF for a couple of weeks, until the day before he was going to be discharged home, when he had 2 bouts of tremors. He refused to go to the ER then the next day had one in the shower. He was brought back to [facility] on 12/17 where clonazepam dose was increased and Keppra was added. He was discharged home after 2 days, then sent back that same day due to continued tremors. Keppra was discontinued, Depakote added. He has not had any further tremors since December 2015.  I personally reviewed MRI brain without contrast done 12/15/13 which showed moderate generalized atrophy, no acute changes. MRI C-spine without contrast showed mild spinal stenosis at C2-3, C3-4, stable and multifactorial with some ossification of the posterior longitudinal ligament suspected (OPLL). Multifactorial moderate or severe neural foraminal stenosis at bilateral C4, right C5, bilateral C6. Posterior element ankylosis at C4-5 with severe facet hypertrophy on the right. Mild multifactorial upper thoracic spinal stenosis at T2-3.  PAST MEDICAL HISTORY: Past Medical History  Diagnosis Date  . CAD (coronary artery disease)   . History of syncope   .  HTN (hypertension)   . Borderline hyperlipidemia   . History of gastritis   . Diverticulosis of colon   . Colon polyps   . Hypertrophy of prostate with urinary obstruction and other lower urinary tract symptoms (LUTS)   . History of pyelonephritis   . DJD  (degenerative joint disease)   . Anxiety   . DM (diabetes mellitus)     Adult onset  . 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 History of kidney stones   . Myoclonus     MEDICATIONS: Current Outpatient Prescriptions on File Prior to Visit  Medication Sig Dispense Refill  . amLODipine (NORVASC) 10 MG tablet Take 1 tablet (10 mg total) by mouth every morning. 90 tablet 3  . aspirin EC 81 MG tablet Take 81 mg by mouth every morning.     . carvedilol (COREG) 12.5 MG tablet Take 1 tablet (12.5 mg total) by mouth 2 (two) times daily with a meal. 180 tablet 1  . clonazePAM (KLONOPIN) 0.5 MG tablet Take 1.5 tablets (0.75 mg total) by mouth 3 (three) times daily. 270 tablet 1  . divalproex (DEPAKOTE) 500 MG DR tablet TAKE 1 TABLET BY MOUTH 2 TIMES DAILY. 180 tablet 1  . glimepiride (AMARYL) 4 MG tablet Take 0.5 tablets (2 mg total) by mouth daily before breakfast. 45 tablet 1  . hydrALAZINE (APRESOLINE) 50 MG tablet Take 1 tablet (50 mg total) by mouth 3 (three) times daily. 270 tablet 1  . Insulin Glargine ([name] SOLOSTAR) 300 UNIT/ML SOPN Inject 12 Units into the skin at bedtime. 1 pen 0  . insulin lispro (HUMALOG) 100 UNIT/ML KiwkPen Inject into the skin. Sliding Scale    . lisinopril-hydrochlorothiazide (PRINZIDE,ZESTORETIC) 20-25 MG per tablet Take 1 tablet by mouth every morning. 90 tablet 2  . potassium chloride SA (K-DUR,KLOR-CON) 20 MEQ tablet Take 1 tablet (20 mEq total) by mouth daily. 30 tablet 3  . tamsulosin (FLOMAX) 0.4 MG CAPS capsule Take 1 capsule (0.4 mg total) by mouth daily after supper. 30 capsule 3   No current facility-administered medications on file prior to visit.    ALLERGIES: Allergies  Allergen Reactions  . Codeine Phosphate Nausea Only    Can take with food and usually doesn't cause nausea  . Morphine Nausea Only    Can take with food and usually doesn't cause nausea  . Metformin And Related  Diarrhea    FAMILY HISTORY: Family History  Problem Relation Age of Onset  . Heart attack Mother   . Dementia Brother   . Colon cancer Neg Hx   . Prostate cancer Neg Hx     SOCIAL HISTORY: History   Social History  . Marital Status: Widowed    Spouse Name: N/A  . Number of Children: 2  . Years of Education: N/A   Occupational History  . Retired    Social History Main Topics  . Smoking status: Never Smoker   . Smokeless tobacco: Never Used  . Alcohol Use: No  . Drug Use: No  . Sexual Activity: Not on file   Other Topics Concern  . Not on file   Social History Narrative   Widowed 2006   2 kids   Enjoys hunting and fishing   Retired from [employer] and [employer] work    REVIEW OF SYSTEMS: Constitutional: No fevers, chills, or sweats, no generalized fatigue, change in appetite Eyes: No  visual changes, double vision, eye pain Ear, nose and throat: No hearing loss, ear pain, nasal congestion, sore throat Cardiovascular: No chest pain, palpitations Respiratory:  No shortness of breath at rest or with exertion, wheezes GastrointestinaI: No nausea, vomiting, diarrhea, abdominal pain, fecal incontinence Genitourinary:  No dysuria, urinary retention or frequency Musculoskeletal:  No neck pain, back pain Integumentary: No rash, pruritus, skin lesions Neurological: as above Psychiatric: No depression, insomnia, anxiety Endocrine: No palpitations, fatigue, diaphoresis, mood swings, change in appetite, change in weight, increased thirst Hematologic/Lymphatic:  No anemia, purpura, petechiae. Allergic/Immunologic: no itchy/runny eyes, nasal congestion, recent allergic reactions, rashes  PHYSICAL EXAM: Filed Vitals:   08/04/14 1025  BP: 124/60  Pulse: 67  Resp: 18   General: No acute distress, no tremors or jerking noted Head:  Normocephalic/atraumatic Neck: supple, no paraspinal tenderness, full range of motion Heart:  Regular rate and rhythm Lungs:  Clear to auscultation  bilaterally Back: No paraspinal tenderness Skin/Extremities: No rash, no edema Neurological Exam: alert and oriented to person, place, and time. No aphasia or dysarthria. Fund of knowledge is appropriate.  Recent and remote memory are intact. 3/3 delayed recall. Able to spell WORLD forward and backward.  Attention and concentration are normal.    Able to name objects and repeat phrases.  Cranial nerves: CN I: not tested CN II: pupils equal, round and reactive to light, visual fields intact, fundi unremarkable. CN III, IV, VI: full range of motion, no nystagmus, no ptosis CN V: facial sensation intact CN VII: upper and lower face symmetric CN VIII: hearing intact to finger rub CN IX, X: gag intact, uvula midline CN XI: sternocleidomastoid and trapezius muscles intact CN XII: tongue midline Bulk & Tone: +cogwheeling R>L with distraction, no fasciculations. Motor: 5/5 throughout with no pronator drift. Sensation: intact to light touch. No extinction to double simultaneous stimulation. Romberg test negative Deep Tendon Reflexes: +2 throughout, no ankle clonus Plantar responses: downgoing bilaterally Cerebellar: no incoordination on finger to nose testing Gait: able to stand with arms crossed over chest with slight difficulty, gait slow and cautious, wide-based, unable to tandem walk Tremor: none noted today  IMPRESSION: This is a 80 yo RH man with a history of hypertension, CAD, CHF, DM, who had sudden onset dizziness, followed by recurrent tremors, noted to be triggered with sitting or standing up, and was diagnosed as startle myoclonus at [facility]. MRI brain, prolonged EEG which captured these episodes, were unremarkable. Bloodwork unremarkable, paraneoplastic panel was sent at [facility] but unavailable for review, request will be sent again today. He has been doing well with no further shaking/jerking/staring/dizziness since December 2015. The etiology of his symptoms unclear, exam today normal except  for mild cogwheeling and wide-based gait. There is still concern for neurodegenerative process causing myoclonus, such as CBD, MSA, or DLB, versus paraneoplastic, however he has been doing very well clinically for the past 6 months. He will try reducing Depakote to 250mg  in AM, 500mg  in PM and assess if this helps with daytime drowsiness. Monitor for any recurrence of symptoms on lower Depakote dose. He is aware of [place] driving laws that after an episode of loss of awareness/consciousness, one should not drive until 6 months event-free. He will follow-up in 3 months and knows to call our office for any changes.   Thank you for allowing me to participate in his care.  Please do not hesitate to call for any questions or concerns.  The duration of this appointment visit was 15 minutes of face-to-face time  with the patient.  Greater than 50% of this time was spent in counseling, explanation of diagnosis, planning of further management, and coordination of care.   Ellouise Newer, M.D.   CC: Dr. Damita Dunnings

## 2014-08-15 ENCOUNTER — Encounter (HOSPITAL_COMMUNITY): Payer: Self-pay | Admitting: Emergency Medicine

## 2014-08-15 ENCOUNTER — Emergency Department (HOSPITAL_COMMUNITY)
Admission: EM | Admit: 2014-08-15 | Discharge: 2014-08-16 | Disposition: A | Discharge status: Home / Self Care | Payer: Medicare Other | Attending: Emergency Medicine | Admitting: Emergency Medicine

## 2014-08-15 DIAGNOSIS — I10 Essential (primary) hypertension: Secondary | ICD-10-CM | POA: Diagnosis not present

## 2014-08-15 DIAGNOSIS — W458XXA Other foreign body or object entering through skin, initial encounter: Secondary | ICD-10-CM | POA: Diagnosis not present

## 2014-08-15 DIAGNOSIS — T148XXA Other injury of unspecified body region, initial encounter: Secondary | ICD-10-CM

## 2014-08-15 DIAGNOSIS — Y9289 Other specified places as the place of occurrence of the external cause: Secondary | ICD-10-CM | POA: Insufficient documentation

## 2014-08-15 DIAGNOSIS — E119 Type 2 diabetes mellitus without complications: Secondary | ICD-10-CM | POA: Insufficient documentation

## 2014-08-15 DIAGNOSIS — Z86010 Personal history of colonic polyps: Secondary | ICD-10-CM | POA: Diagnosis not present

## 2014-08-15 DIAGNOSIS — I252 Old myocardial infarction: Secondary | ICD-10-CM | POA: Insufficient documentation

## 2014-08-15 DIAGNOSIS — Z79899 Other long term (current) drug therapy: Secondary | ICD-10-CM | POA: Diagnosis not present

## 2014-08-15 DIAGNOSIS — Z23 Encounter for immunization: Secondary | ICD-10-CM | POA: Insufficient documentation

## 2014-08-15 DIAGNOSIS — Y9389 Activity, other specified: Secondary | ICD-10-CM | POA: Diagnosis not present

## 2014-08-15 DIAGNOSIS — Z794 Long term (current) use of insulin: Secondary | ICD-10-CM | POA: Diagnosis not present

## 2014-08-15 DIAGNOSIS — Y999 Unspecified external cause status: Secondary | ICD-10-CM | POA: Insufficient documentation

## 2014-08-15 DIAGNOSIS — F419 Anxiety disorder, unspecified: Secondary | ICD-10-CM | POA: Insufficient documentation

## 2014-08-15 DIAGNOSIS — Z87442 Personal history of urinary calculi: Secondary | ICD-10-CM | POA: Diagnosis not present

## 2014-08-15 DIAGNOSIS — S51012A Laceration without foreign body of left elbow, initial encounter: Secondary | ICD-10-CM | POA: Diagnosis not present

## 2014-08-15 DIAGNOSIS — Z8719 Personal history of other diseases of the digestive system: Secondary | ICD-10-CM | POA: Diagnosis not present

## 2014-08-15 DIAGNOSIS — S41112A Laceration without foreign body of left upper arm, initial encounter: Secondary | ICD-10-CM | POA: Diagnosis not present

## 2014-08-15 DIAGNOSIS — Z7982 Long term (current) use of aspirin: Secondary | ICD-10-CM | POA: Diagnosis not present

## 2014-08-15 DIAGNOSIS — I2510 Atherosclerotic heart disease of native coronary artery without angina pectoris: Secondary | ICD-10-CM | POA: Insufficient documentation

## 2014-08-15 MED ORDER — TETANUS-DIPHTH-ACELL PERTUSSIS 5-2.5-18.5 LF-MCG/0.5 IM SUSP
0.5000 mL | Freq: Once | INTRAMUSCULAR | Status: AC
  Administered 2014-08-15: 0.5 mL via INTRAMUSCULAR
  Filled 2014-08-15: qty 0.5

## 2014-08-15 NOTE — ED Provider Notes (Signed)
CSN: [id number]     Arrival date & time 08/15/14  2153 History  This chart was scribed for Antonietta Breach, PA-C, working with Delice Bison Ward, DO by Julien Nordmann, ED Scribe. This patient was seen in room WTR8/WTR8 and the patient's care was started at 11:29 PM.    Chief Complaint  Patient presents with  . Extremity Laceration    The history is provided by the patient. No language interpreter was used.   HPI Comments: [name] is a 80 y.o. male who presents to the Emergency Department complaining of a left arm laceration x 2 onset 5 hours ago. He describes his pain as a throbbing sensation. Pt notes cutting his left arm on some brush when he was mowing the lawn at 1800 today. He reports applying pressure to the wound but reports having difficulty controlling the bleeding. Pt does take aspirin on a daily basis. Pt denies being on blood thinners. He is unsure when his last tetanus shot was.   Past Medical History  Diagnosis Date  . CAD (coronary artery disease)   . History of syncope   . HTN (hypertension)   . Borderline hyperlipidemia   . History of gastritis   . Diverticulosis of colon   . Colon polyps   . Hypertrophy of prostate with urinary obstruction and other lower urinary tract symptoms (LUTS)   . History of pyelonephritis   . DJD (degenerative joint disease)   . Anxiety   . DM (diabetes mellitus)     Adult onset  . 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 History of kidney stones   . Myoclonus    Past Surgical History  Procedure Laterality Date  . Appendectomy    . Cataract extraction      RIGHT EYE  . Knee surgery      bilateral ARTHROSCOPY X2 TO EACH KNEE  . Shoulder surgery  04/2005    left by Dr. Berenice Primas  . 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 S/p cysto & stents for kidnedy stone 11/08 by dr. Reece Agar    . Cystoscopy  04/17/11     stent placed  . Cystoscopy w/ ureteral stent placement  04/17/2011    Procedure: CYSTOSCOPY WITH RETROGRADE PYELOGRAM/URETERAL STENT PLACEMENT;  Surgeon: Hanley Ben, MD;  Location: [facility];  Service: Urology;  Laterality: Left;  . Cystoscopy with retrograde pyelogram, ureteroscopy and stent placement Left 04/18/2013    Procedure: CYSTOSCOPY WITH LEFT RETROGRADE PYELOGRAM, LEFT URETEROSCOPY AND LASER LITHOTRIPSY LEFT STENT PLACEMENT;  Surgeon: Dutch Gray, MD;  Location: [facility];  Service: Urology;  Laterality: Left;  . Holmium laser application Left 09/16/5782    Procedure: HOLMIUM LASER APPLICATION;  Surgeon: Dutch Gray, MD;  Location: [facility];  Service: Urology;  Laterality: Left;  . Cholecystectomy     Family History  Problem Relation Age of Onset  . Heart attack Mother   . Dementia Brother   . Colon cancer Neg Hx   . Prostate cancer Neg Hx    History  Substance Use Topics  . Smoking status: Never Smoker   . Smokeless tobacco: Never Used  . Alcohol Use: No    Review of Systems  Constitutional: Negative for fatigue.  Skin: Positive for wound.  All other systems reviewed and are negative.   Allergies  Codeine phosphate; Morphine;  and Metformin and related  Home Medications   Prior to Admission medications   Medication Sig Start Date End Date Taking? Authorizing Provider  amLODipine (NORVASC) 10 MG tablet Take 1 tablet (10 mg total) by mouth every morning. 05/09/14  Yes Tonia Ghent, MD  aspirin EC 81 MG tablet Take 81 mg by mouth every morning.    Yes Historical Provider, MD  carvedilol (COREG) 12.5 MG tablet Take 1 tablet (12.5 mg total) by mouth 2 (two) times daily with a meal. 05/09/14  Yes Tonia Ghent, MD  clonazePAM (KLONOPIN) 0.5 MG tablet Take 1.5 tablets (0.75 mg total) by mouth 3 (three) times daily. 05/23/14  Yes Tonia Ghent, MD  divalproex (DEPAKOTE) 500 MG DR tablet TAKE 1 TABLET BY MOUTH 2 TIMES DAILY. Patient taking differently: Take 500 mg by mouth daily.   05/09/14  Yes Tonia Ghent, MD  glimepiride (AMARYL) 4 MG tablet Take 0.5 tablets (2 mg total) by mouth daily before breakfast. 06/02/14  Yes Tonia Ghent, MD  hydrALAZINE (APRESOLINE) 50 MG tablet Take 1 tablet (50 mg total) by mouth 3 (three) times daily. 05/23/14  Yes Tonia Ghent, MD  hydrochlorothiazide (HYDRODIURIL) 25 MG tablet Take 25 mg by mouth daily.   Yes Historical Provider, MD  insulin aspart (NOVOLOG) 100 UNIT/ML injection Inject 4-10 Units into the skin 3 (three) times daily before meals.   Yes Historical Provider, MD  lisinopril-hydrochlorothiazide (PRINZIDE,ZESTORETIC) 20-25 MG per tablet Take 1 tablet by mouth every morning. 08/03/14  Yes Tonia Ghent, MD  potassium chloride SA (K-DUR,KLOR-CON) 20 MEQ tablet Take 1 tablet (20 mEq total) by mouth daily. 05/12/14  Yes Tonia Ghent, MD  tamsulosin (FLOMAX) 0.4 MG CAPS capsule Take 1 capsule (0.4 mg total) by mouth daily after supper. 05/12/14  Yes Tonia Ghent, MD  Insulin Glargine ([name] SOLOSTAR) 300 UNIT/ML SOPN Inject 12 Units into the skin at bedtime. Patient not taking: Reported on 08/15/2014 06/23/14   Tonia Ghent, MD   Triage vitals: BP 154/78 mmHg  Pulse 75  Temp(Src) 97.7 F (36.5 C) (Oral)  Resp 18  SpO2 95%  Physical Exam  Constitutional: He is oriented to person, place, and time. He appears well-developed and well-nourished. No distress.  Nontoxic/nonseptic appearing  HENT:  Head: Normocephalic and atraumatic.  Eyes: Conjunctivae and EOM are normal. No scleral icterus.  Neck: Normal range of motion.  Cardiovascular: Normal rate, regular rhythm and intact distal pulses.   Distal radial pulse 2+ and left upper extremity  Pulmonary/Chest: Effort normal. No respiratory distress.  Musculoskeletal: Normal range of motion.       Left elbow: He exhibits laceration (superficial laceration/abrasion x 2). He exhibits normal range of motion, no swelling and no deformity. No tenderness found.        Arms: Neurological: He is alert and oriented to person, place, and time. He exhibits normal muscle tone. Coordination normal.  Sensation intact in the left upper extremity.  Skin: Skin is warm and dry. No rash noted. He is not diaphoretic. No erythema. No pallor.  2 linear superficial abrasions to the left AC approximately 5 cm in length, no induration, no heat to touch, no red streaking. No purulence or drainage.  Psychiatric: He has a normal mood and affect. His behavior is normal.  Nursing note and vitals reviewed.   ED Course  Procedures  DIAGNOSTIC STUDIES: Oxygen Saturation is 95% on RA, normal by my interpretation.  COORDINATION OF CARE:  11:32 PM Discussed  treatment plan which includes update tetanus shot, glue abrasions and wrap wound with pt at bedside and pt agreed to plan.  Labs Review Labs Reviewed - No data to display  Imaging Review No results found.   EKG Interpretation None      LACERATION REPAIR Performed by: Antonietta Breach Authorized by: Antonietta Breach Consent: Verbal consent obtained. Risks and benefits: risks, benefits and alternatives were discussed Consent given by: patient Patient identity confirmed: provided demographic data Prepped and Draped in normal sterile fashion Wound explored  Laceration Location: left AC  Laceration Length: 5cm  No Foreign Bodies seen or palpated  Anesthesia: none  Local anesthetic: none  Anesthetic total: n/a  Irrigation method: syringe Amount of cleaning: standard  Skin closure: dermabond  Number of sutures: n/a  Technique: simple  Patient tolerance: Patient tolerated the procedure well with no immediate complications.  LACERATION REPAIR Performed by: Antonietta Breach Authorized by: Antonietta Breach Consent: Verbal consent obtained. Risks and benefits: risks, benefits and alternatives were discussed Consent given by: patient Patient identity confirmed: provided demographic data Prepped and Draped in normal  sterile fashion Wound explored  Laceration Location: left AC  Laceration Length: 5cm  No Foreign Bodies seen or palpated  Anesthesia: none  Local anesthetic: none  Anesthetic total: n/a  Irrigation method: syringe Amount of cleaning: standard  Skin closure: dermabond  Number of sutures: n/a  Technique: simple  Patient tolerance: Patient tolerated the procedure well with no immediate complications. MDM   Final diagnoses:  Superficial abrasion    80 year old neurovascularly intact male presents to the emergency department for further evaluation of superficial lacerations/abrasions to his left arm after being scratched by some brush at 1800. No active bleeding on examination. No foreign body. There is an abrasion x 2 through the epidermis, but no cut through the dermis or underlying tissue. No evidence of secondary infection. No purulent drainage, induration, or red streaking. Wounds covered with Dermabond and pressure dressing. Tetanus updated. Patient to be referred to his PCP for wound recheck. Return precautions given. Patient agreeable to plan with no unaddressed concerns. Patient discharged in good condition.  I personally performed the services described in this documentation, which was scribed in my presence. The recorded information has been reviewed and is accurate.   Filed Vitals:   08/15/14 2204  BP: 154/78  Pulse: 75  Temp: 97.7 F (36.5 C)  TempSrc: Oral  Resp: 18  SpO2: 95%     Antonietta Breach, PA-C 08/16/14 [place], DO 08/16/14 [id number]

## 2014-08-15 NOTE — ED Notes (Signed)
Pt states he was mowing and got into the bushes and cut his left arm on some brush  Pt states he could not get the bleeding to stop  Pt is not on blood thinners but states he takes an aspirin daily

## 2014-08-16 NOTE — Discharge Instructions (Signed)

## 2014-08-17 ENCOUNTER — Telehealth: Payer: Self-pay | Admitting: Family Medicine

## 2014-08-17 MED ORDER — AMLODIPINE BESYLATE 10 MG PO TABS: 5.0000 mg | ORAL_TABLET | Freq: Every morning | ORAL | Status: DC

## 2014-08-17 NOTE — Telephone Encounter (Signed)
Form done.  Thanks.  Scan and send.

## 2014-08-17 NOTE — Telephone Encounter (Signed)
[place] dropped off FMLA paperwork for [employer]   In Dr Josefine Class IN BOX Copy of old FMLA also in folder  For review and signature Caryl Asp stated she needed this by 08/22/14 if possible

## 2014-08-17 NOTE — Telephone Encounter (Signed)
Joy pts daughter left v/m; pt experiencing a lot of sleepiness and dizziness; pt is presently taking 3 BP meds and wonders if could come off of some of BP meds to see if 3 BP meds would cause these symptoms.  Started with these complaints about one month ago. On 08/14/14 while mowing cut arm and pt was seen in ED and BP was 124/70 and that is an average of what BP has been running.Joy request cb.[facility].

## 2014-08-17 NOTE — Telephone Encounter (Signed)
Would cut amlodipine back to 5mg  (1/2 of 10mg  tab) and see how he feels/how his BP runs.  Would likely take about 1 week to notice the change.  Update [place] late next week, sooner if needed.  Thanks.  Med list updated.

## 2014-08-17 NOTE — Telephone Encounter (Signed)
Left detailed message on voicemail.  

## 2014-08-18 NOTE — Telephone Encounter (Signed)
Paperwork faxed to [place] leave solutions [phone]  Copy for scan Copy for pt Copy for file  Left message asking [name]  to call office Please let [name] know paperwork ready for pick and it has been faxed

## 2014-08-18 NOTE — Telephone Encounter (Signed)
Folder returned to [place].

## 2014-08-18 NOTE — Telephone Encounter (Signed)
Joy aware paperwork has been faxed and a copy here for her

## 2014-08-25 NOTE — Telephone Encounter (Signed)
[name] called she stated on her fmla paperwork section D needs to be filled in  Cannot take unknown In dr Josefine Class IN BOX

## 2014-08-27 NOTE — Telephone Encounter (Signed)
I filled out the form to the best of my ability.  I remain unable to completely and accurately predict the future.

## 2014-08-28 NOTE — Telephone Encounter (Signed)
Left message letting pt know forms were refaxed Copy for scan Copy for file

## 2014-09-04 NOTE — Telephone Encounter (Signed)
[name] called back she stated that the people working on her fmla needs  section A question 2  They have to have a date  [name] wanted to know if you would put 01/30/15

## 2014-09-04 NOTE — Telephone Encounter (Signed)
FMLA paperwork in dr Josefine Class IN BOX

## 2014-09-05 NOTE — Telephone Encounter (Signed)
Form redone.  I put 02/10/2018.  There is no reason to suspect complete resolution of his sx in less than 6 months from today.

## 2014-09-05 NOTE — Telephone Encounter (Signed)
Given to Robin to process. 

## 2014-09-06 NOTE — Telephone Encounter (Signed)
Paper work faxed Left voice mail message letting joy paperwork has been faxed

## 2014-09-07 ENCOUNTER — Telehealth: Payer: Self-pay | Admitting: Family Medicine

## 2014-09-07 NOTE — Telephone Encounter (Signed)
Left detailed message on AM x 2.

## 2014-09-07 NOTE — Telephone Encounter (Signed)
Daughter is concerned that pt's legs are aching all the time and that pt is always cold.  Pt has been upset that his friends are passing away, ans legs are hurting badly.  Pt can make an appt for Friday, call back number [phone], leave message and she can call you back, she is at work.

## 2014-09-07 NOTE — Telephone Encounter (Signed)
Please schedule for tomorrow. Thanks.

## 2014-09-08 ENCOUNTER — Ambulatory Visit (INDEPENDENT_AMBULATORY_CARE_PROVIDER_SITE_OTHER): Payer: Medicare Other | Admitting: Family Medicine

## 2014-09-08 ENCOUNTER — Encounter: Payer: Self-pay | Admitting: Family Medicine

## 2014-09-08 VITALS — BP 142/80 | HR 66 | Temp 97.7°F | Wt 242.2 lb

## 2014-09-08 DIAGNOSIS — R531 Weakness: Secondary | ICD-10-CM

## 2014-09-08 DIAGNOSIS — E119 Type 2 diabetes mellitus without complications: Secondary | ICD-10-CM

## 2014-09-08 DIAGNOSIS — G253 Myoclonus: Secondary | ICD-10-CM

## 2014-09-08 LAB — CBC WITH DIFFERENTIAL/PLATELET
Basophils Absolute: 0 10*3/uL (ref 0.0–0.1)
Basophils Relative: 0.6 % (ref 0.0–3.0)
EOS ABS: 0.2 10*3/uL (ref 0.0–0.7)
EOS PCT: 3.3 % (ref 0.0–5.0)
HCT: 48.8 % (ref 39.0–52.0)
Hemoglobin: 16.4 g/dL (ref 13.0–17.0)
LYMPHS ABS: 2.2 10*3/uL (ref 0.7–4.0)
LYMPHS PCT: 35.1 % (ref 12.0–46.0)
MCHC: 33.7 g/dL (ref 30.0–36.0)
MCV: 96.9 fl (ref 78.0–100.0)
Monocytes Absolute: 0.9 10*3/uL (ref 0.1–1.0)
Monocytes Relative: 13.6 % — ABNORMAL HIGH (ref 3.0–12.0)
NEUTROS PCT: 47.4 % (ref 43.0–77.0)
Neutro Abs: 3 10*3/uL (ref 1.4–7.7)
PLATELETS: 278 10*3/uL (ref 150.0–400.0)
RBC: 5.03 Mil/uL (ref 4.22–5.81)
RDW: 14.5 % (ref 11.5–15.5)
WBC: 6.3 10*3/uL (ref 4.0–10.5)

## 2014-09-08 LAB — COMPREHENSIVE METABOLIC PANEL
ALT: 29 U/L (ref 0–53)
AST: 16 U/L (ref 0–37)
Albumin: 4.2 g/dL (ref 3.5–5.2)
Alkaline Phosphatase: 56 U/L (ref 39–117)
BUN: 26 mg/dL — ABNORMAL HIGH (ref 6–23)
CO2: 29 mEq/L (ref 19–32)
Calcium: 9.9 mg/dL (ref 8.4–10.5)
Chloride: 103 mEq/L (ref 96–112)
Creatinine, Ser: 1.16 mg/dL (ref 0.40–1.50)
GFR: 64.77 mL/min (ref >60.00)
GLUCOSE: 166 mg/dL — AB (ref 70–99)
Potassium: 3.9 mEq/L (ref 3.5–5.1)
SODIUM: 142 meq/L (ref 135–145)
Total Bilirubin: 0.4 mg/dL (ref 0.2–1.2)
Total Protein: 7 g/dL (ref 6.0–8.3)

## 2014-09-08 LAB — CK: Total CK: 131 U/L (ref 7–232)

## 2014-09-08 LAB — TSH: TSH: 5.37 u[IU]/mL — ABNORMAL HIGH (ref 0.35–4.50)

## 2014-09-08 LAB — HEMOGLOBIN A1C: Hgb A1c MFr Bld: 7.3 % — ABNORMAL HIGH (ref 4.6–6.5)

## 2014-09-08 MED ORDER — DIVALPROEX SODIUM 500 MG PO DR TAB: DELAYED_RELEASE_TABLET | ORAL | Status: DC

## 2014-09-08 NOTE — Progress Notes (Signed)
Pre visit review using our clinic review tool, if applicable. No additional management support is needed unless otherwise documented below in the visit note.  Recently with another possible tremor episode.  His friend had died and he was at the funeral.  It was hot and he hadn't eaten much.  He needed a driver to get home.  It was the first "bad one" in months per report.  No LOC.  He continues with a wide based symmetric gait with occ stumbles.  No focal weakness but he does have B leg aches and a weak sensation in the legs, ie feeling less sturdy when walking.  He has a cold sensation in the B legs. No CP, SOB, BLE edema, abd pain, GI sx or stools changes.  No rash.  No fevers.   Compliant with meds, reviewed with patient.   PMH and SH reviewed  ROS: See HPI, otherwise noncontributory.  Meds, vitals, and allergies reviewed.   GEN: nad, alert and oriented HEENT: mucous membranes moist NECK: supple w/o LA CV: rrr. PULM: ctab, no inc wob ABD: soft, +bs EXT: no edema SKIN: no acute rash CN 2-12 wnl B, S/S wnl x4 wide based symmetric gait No focal weakness on exam.

## 2014-09-08 NOTE — Patient Instructions (Signed)
Go to the lab on the way out.  We'll contact you with your lab report. Don't change your meds for now.  Don't skip meals.  Make sure to stay out of the heat.  We'll be in touch.  Take care. Glad to see you.

## 2014-09-10 ENCOUNTER — Other Ambulatory Visit: Payer: Self-pay | Admitting: Family Medicine

## 2014-09-10 DIAGNOSIS — E1121 Type 2 diabetes mellitus with diabetic nephropathy: Secondary | ICD-10-CM

## 2014-09-10 NOTE — Assessment & Plan Note (Signed)
See notes on labs.  I'll contact neuro.  It isn't clear to me how much of this episode at the funeral was related to heat/fasting/relative dehydration.   He isn't focally weak but still has chronic gait changes.   I would like neuro input.  >25 minutes spent in face to face time with patient, >50% spent in counselling or coordination of care.

## 2014-09-13 ENCOUNTER — Telehealth: Payer: Self-pay

## 2014-09-13 NOTE — Telephone Encounter (Signed)
Beth nurse case mgr with [employer] left v/m; pt wants to enroll in [facility] CHF program and Beth needs dx that pt has CHF. Can leave detailed message if no answer. Left detailed v/m that pt has chronic combined systolic and diastolic CHF dx. [phone].

## 2014-09-18 ENCOUNTER — Ambulatory Visit (INDEPENDENT_AMBULATORY_CARE_PROVIDER_SITE_OTHER): Payer: Medicare Other | Admitting: Neurology

## 2014-09-18 ENCOUNTER — Encounter (HOSPITAL_COMMUNITY): Payer: Self-pay | Admitting: Emergency Medicine

## 2014-09-18 ENCOUNTER — Encounter: Payer: Self-pay | Admitting: Neurology

## 2014-09-18 ENCOUNTER — Emergency Department (HOSPITAL_COMMUNITY)
Admission: EM | Admit: 2014-09-18 | Discharge: 2014-09-18 | Disposition: A | Discharge status: Home / Self Care | Payer: Medicare Other | Attending: Emergency Medicine | Admitting: Emergency Medicine

## 2014-09-18 VITALS — BP 146/88 | HR 67 | Resp 18 | Ht 72.0 in | Wt 242.0 lb

## 2014-09-18 DIAGNOSIS — M545 Low back pain, unspecified: Secondary | ICD-10-CM

## 2014-09-18 DIAGNOSIS — I10 Essential (primary) hypertension: Secondary | ICD-10-CM | POA: Insufficient documentation

## 2014-09-18 DIAGNOSIS — R2681 Unsteadiness on feet: Secondary | ICD-10-CM

## 2014-09-18 DIAGNOSIS — G20 Parkinson's disease: Secondary | ICD-10-CM | POA: Diagnosis not present

## 2014-09-18 DIAGNOSIS — Z8739 Personal history of other diseases of the musculoskeletal system and connective tissue: Secondary | ICD-10-CM | POA: Insufficient documentation

## 2014-09-18 DIAGNOSIS — I252 Old myocardial infarction: Secondary | ICD-10-CM | POA: Diagnosis not present

## 2014-09-18 DIAGNOSIS — Z87442 Personal history of urinary calculi: Secondary | ICD-10-CM | POA: Diagnosis not present

## 2014-09-18 DIAGNOSIS — R531 Weakness: Secondary | ICD-10-CM | POA: Insufficient documentation

## 2014-09-18 DIAGNOSIS — F410 Panic disorder [episodic paroxysmal anxiety] without agoraphobia: Secondary | ICD-10-CM

## 2014-09-18 DIAGNOSIS — Z794 Long term (current) use of insulin: Secondary | ICD-10-CM | POA: Diagnosis not present

## 2014-09-18 DIAGNOSIS — Z8669 Personal history of other diseases of the nervous system and sense organs: Secondary | ICD-10-CM | POA: Diagnosis not present

## 2014-09-18 DIAGNOSIS — E119 Type 2 diabetes mellitus without complications: Secondary | ICD-10-CM | POA: Insufficient documentation

## 2014-09-18 DIAGNOSIS — N401 Enlarged prostate with lower urinary tract symptoms: Secondary | ICD-10-CM | POA: Insufficient documentation

## 2014-09-18 DIAGNOSIS — Z8719 Personal history of other diseases of the digestive system: Secondary | ICD-10-CM | POA: Insufficient documentation

## 2014-09-18 DIAGNOSIS — Z79899 Other long term (current) drug therapy: Secondary | ICD-10-CM | POA: Insufficient documentation

## 2014-09-18 DIAGNOSIS — R251 Tremor, unspecified: Secondary | ICD-10-CM

## 2014-09-18 DIAGNOSIS — F419 Anxiety disorder, unspecified: Secondary | ICD-10-CM | POA: Diagnosis not present

## 2014-09-18 DIAGNOSIS — Z86010 Personal history of colonic polyps: Secondary | ICD-10-CM | POA: Diagnosis not present

## 2014-09-18 DIAGNOSIS — Z7982 Long term (current) use of aspirin: Secondary | ICD-10-CM | POA: Insufficient documentation

## 2014-09-18 DIAGNOSIS — I6789 Other cerebrovascular disease: Secondary | ICD-10-CM | POA: Diagnosis not present

## 2014-09-18 DIAGNOSIS — M6281 Muscle weakness (generalized): Secondary | ICD-10-CM | POA: Diagnosis not present

## 2014-09-18 DIAGNOSIS — I2510 Atherosclerotic heart disease of native coronary artery without angina pectoris: Secondary | ICD-10-CM | POA: Diagnosis not present

## 2014-09-18 LAB — CBC WITH DIFFERENTIAL/PLATELET
BASOS ABS: 0 10*3/uL (ref 0.0–0.1)
Basophils Relative: 0 % (ref 0–1)
EOS ABS: 0.2 10*3/uL (ref 0.0–0.7)
EOS PCT: 3 % (ref 0–5)
HEMATOCRIT: 46.7 % (ref 39.0–52.0)
Hemoglobin: 16.2 g/dL (ref 13.0–17.0)
Lymphocytes Relative: 25 % (ref 12–46)
Lymphs Abs: 2.1 10*3/uL (ref 0.7–4.0)
MCH: 33.1 pg (ref 26.0–34.0)
MCHC: 34.7 g/dL (ref 30.0–36.0)
MCV: 95.5 fL (ref 78.0–100.0)
MONO ABS: 1 10*3/uL (ref 0.1–1.0)
Monocytes Relative: 12 % (ref 3–12)
NEUTROS ABS: 5.2 10*3/uL (ref 1.7–7.7)
Neutrophils Relative %: 60 % (ref 43–77)
PLATELETS: 240 10*3/uL (ref 150–400)
RBC: 4.89 MIL/uL (ref 4.22–5.81)
RDW: 13.8 % (ref 11.5–15.5)
WBC: 8.5 10*3/uL (ref 4.0–10.5)

## 2014-09-18 LAB — BASIC METABOLIC PANEL
ANION GAP: 10 (ref 5–15)
BUN: 19 mg/dL (ref 6–20)
CALCIUM: 9.5 mg/dL (ref 8.9–10.3)
CHLORIDE: 104 mmol/L (ref 101–111)
CO2: 26 mmol/L (ref 22–32)
Creatinine, Ser: 1.09 mg/dL (ref 0.61–1.24)
GFR calc non Af Amer: >60 mL/min (ref >60)
GLUCOSE: 114 mg/dL — AB (ref 65–99)
Potassium: 3.3 mmol/L — ABNORMAL LOW (ref 3.5–5.1)
Sodium: 140 mmol/L (ref 135–145)

## 2014-09-18 LAB — VALPROIC ACID LEVEL: Valproic Acid Lvl: 31 ug/mL — ABNORMAL LOW (ref 50.0–100.0)

## 2014-09-18 MED ORDER — LORAZEPAM 1 MG PO TABS
1.0000 mg | ORAL_TABLET | Freq: Once | ORAL | Status: AC
  Administered 2014-09-18: 1 mg via ORAL
  Filled 2014-09-18: qty 1

## 2014-09-18 MED ORDER — SERTRALINE HCL 25 MG PO TABS: ORAL_TABLET | ORAL | Status: DC

## 2014-09-18 NOTE — ED Notes (Signed)
Bed: ZV47 Expected date:  Expected time:  Means of arrival:  Comments: EMS, coming from MD office, weakness, dementia

## 2014-09-18 NOTE — Patient Instructions (Addendum)
1. Schedule MRI lumbar spine without contrast 2. Schedule EMG/NCV of both LE with Dr. Posey Pronto 3. Bloodwork for ESR, CRP, B12, SPEP/IFE 4. Use walker at all times 5. Refer to PT for Balance therapy 6. Follow-up after tests 7. Start Zoloft 16m daily 8. Refer for Home Health evaluation

## 2014-09-18 NOTE — ED Notes (Signed)
Per EMS-was in neurologist office for follow-up. Recently diagnosed with encephalopathy, tremors, mild dementia, anxiety. Called out for erratic breathing/banging head up against wall/extreme tremors. VS: 180/98 HR 84 SpO2 95% on RA. Has random moments of violent tremors-re-oriented and patient relaxes. No other c/c.

## 2014-09-18 NOTE — Discharge Instructions (Signed)
Follow-up with your primary doctor in your neurologist. Return to the emergency department if you develop fevers, lower extremity weakness, numbness or tingling, loss of control of bowels or bladder, or other concerning signs or symptoms.

## 2014-09-18 NOTE — ED Notes (Signed)
MD at bedside. 

## 2014-09-18 NOTE — ED Provider Notes (Signed)
CSN: [id number]     Arrival date & time 09/18/14  1526 History   First MD Initiated Contact with Patient 09/18/14 1636     Chief Complaint  Patient presents with  . Tremors  . Weakness     (Consider location/radiation/quality/duration/timing/severity/associated sxs/prior Treatment) HPI Comments: 79 yo male with recurrent episodes of shaking.  He came to the ED from his neurologist's office after having one of these episodes.  It involved generalized shaking, maintenance of consciousness, and distractability of symptoms. It lasted for several minutes.  It occurred after he thought his neurologist wanted to put a needle in his back.  (by daughter's report, the neurologist actually recommended and MR of his back and nerve conduction studies of his legs.    Over the past several months he has had an extensive workup at multiple hospitals and specialists for the shaking episodes and for gait instability.     Past Medical History  Diagnosis Date  . CAD (coronary artery disease)   . History of syncope   . HTN (hypertension)   . Borderline hyperlipidemia   . History of gastritis   . Diverticulosis of colon   . Colon polyps   . Hypertrophy of prostate with urinary obstruction and other lower urinary tract symptoms (LUTS)   . History of pyelonephritis   . DJD (degenerative joint disease)   . Anxiety   . DM (diabetes mellitus)     Adult onset  . 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 History of kidney stones   . Myoclonus    Past Surgical History  Procedure Laterality Date  . Appendectomy    . Cataract extraction      RIGHT EYE  . Knee surgery      bilateral ARTHROSCOPY X2 TO EACH KNEE  . Shoulder surgery  04/2005    left by Dr. Berenice Primas  . 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 S/p cysto & stents for kidnedy stone 11/08 by dr. Reece Agar    . Cystoscopy   04/17/11    stent placed  . Cystoscopy w/ ureteral stent placement  04/17/2011    Procedure: CYSTOSCOPY WITH RETROGRADE PYELOGRAM/URETERAL STENT PLACEMENT;  Surgeon: Hanley Ben, MD;  Location: [facility];  Service: Urology;  Laterality: Left;  . Cystoscopy with retrograde pyelogram, ureteroscopy and stent placement Left 04/18/2013    Procedure: CYSTOSCOPY WITH LEFT RETROGRADE PYELOGRAM, LEFT URETEROSCOPY AND LASER LITHOTRIPSY LEFT STENT PLACEMENT;  Surgeon: Dutch Gray, MD;  Location: [facility];  Service: Urology;  Laterality: Left;  . Holmium laser application Left 04/15/91    Procedure: HOLMIUM LASER APPLICATION;  Surgeon: Dutch Gray, MD;  Location: [facility];  Service: Urology;  Laterality: Left;  . Cholecystectomy     Family History  Problem Relation Age of Onset  . Heart attack Mother   . Dementia Brother   . Colon cancer Neg Hx   . Prostate cancer Neg Hx    History  Substance Use Topics  . Smoking status: Never Smoker   . Smokeless tobacco: Never Used  . Alcohol Use: No    Review of Systems  Neurological: Positive for weakness.  All other systems reviewed and are negative.     Allergies  Codeine phosphate; Morphine; and Metformin and related  Home Medications   Prior to Admission medications   Medication Sig Start  Date End Date Taking? Authorizing Provider  amLODipine (NORVASC) 10 MG tablet Take 0.5 tablets (5 mg total) by mouth every morning. 08/17/14  Yes Tonia Ghent, MD  aspirin EC 81 MG tablet Take 81 mg by mouth every evening.    Yes Historical Provider, MD  carvedilol (COREG) 12.5 MG tablet Take 1 tablet (12.5 mg total) by mouth 2 (two) times daily with a meal. 05/09/14  Yes Tonia Ghent, MD  Cinnamon 500 MG capsule Take 500 mg by mouth 3 (three) times daily.   Yes Historical Provider, MD  clonazePAM (KLONOPIN) 0.5 MG tablet Take 1.5 tablets (0.75 mg total) by mouth 3 (three) times daily. Patient taking differently: Take 0.5 mg by mouth 3 (three) times daily.  05/23/14   Yes Tonia Ghent, MD  COD LIVER OIL PO Take 1 capsule by mouth 3 (three) times daily.   Yes Historical Provider, MD  divalproex (DEPAKOTE) 500 MG DR tablet Take one half tablet (250 mg) by mouth each morning and 1 tablet (500 mg) by mouth each evening. 09/08/14  Yes Tonia Ghent, MD  glimepiride (AMARYL) 4 MG tablet Take 0.5 tablets (2 mg total) by mouth daily before breakfast. 06/02/14  Yes Tonia Ghent, MD  hydrALAZINE (APRESOLINE) 50 MG tablet Take 1 tablet (50 mg total) by mouth 3 (three) times daily. 05/23/14  Yes Tonia Ghent, MD  hydrochlorothiazide (HYDRODIURIL) 25 MG tablet Take 25 mg by mouth daily.   Yes Historical Provider, MD  insulin aspart (NOVOLOG) 100 UNIT/ML injection Inject 4-10 Units into the skin 3 (three) times daily before meals. Sliding scale   Yes Historical Provider, MD  Insulin Glargine ([name] SOLOSTAR) 300 UNIT/ML SOPN Inject 12 Units into the skin at bedtime. 06/23/14  Yes Tonia Ghent, MD  lisinopril-hydrochlorothiazide [facility]) 20-25 MG per tablet Take 1 tablet by mouth every morning. 08/03/14  Yes Tonia Ghent, MD  naproxen sodium (ANAPROX) 220 MG tablet Take 440 mg by mouth 2 (two) times daily as needed (leg pain).   Yes Historical Provider, MD  potassium chloride SA (K-DUR,KLOR-CON) 20 MEQ tablet Take 1 tablet (20 mEq total) by mouth daily. Patient taking differently: Take 20 mEq by mouth every evening.  05/12/14  Yes Tonia Ghent, MD  tamsulosin (FLOMAX) 0.4 MG CAPS capsule Take 1 capsule (0.4 mg total) by mouth daily after supper. 05/12/14  Yes Tonia Ghent, MD  sertraline (ZOLOFT) 25 MG tablet Take 1 tablet daily Patient not taking: Reported on 09/18/2014 09/18/14   Cameron Sprang, MD   BP 177/92 mmHg  Pulse 76  Temp(Src) 97.5 F (36.4 C) (Oral)  Resp 22  SpO2 94% Physical Exam  Constitutional: He is oriented to person, place, and time. He appears well-developed and well-nourished. No distress.  HENT:  Head: Normocephalic and  atraumatic.  Mouth/Throat: Oropharynx is clear and moist.  Eyes: Conjunctivae are normal. Pupils are equal, round, and reactive to light. No scleral icterus.  Neck: Neck supple.  Cardiovascular: Normal rate, regular rhythm, normal heart sounds and intact distal pulses.   No murmur heard. Pulmonary/Chest: Effort normal and breath sounds normal. No stridor. No respiratory distress. He has no wheezes. He has no rales.  Abdominal: Soft. He exhibits no distension. There is no tenderness.  Musculoskeletal: Normal range of motion. He exhibits no edema.  Neurological: He is alert and oriented to person, place, and time.  Episodes of generalized shaking (both arms both legs), no LOC.  These episodes are improved with deep  breathing or distraction.    Normal BLE strength and sensation.  Skin: Skin is warm and dry. No rash noted.  Psychiatric: He has a normal mood and affect. His behavior is normal.  Nursing note and vitals reviewed.   ED Course  Procedures (including critical care time) Labs Review Labs Reviewed  VALPROIC ACID LEVEL - Abnormal; Notable for the following:    Valproic Acid Lvl 31 (*)    All other components within normal limits  BASIC METABOLIC PANEL - Abnormal; Notable for the following:    Potassium 3.3 (*)    Glucose, Bld 114 (*)    All other components within normal limits  CBC WITH DIFFERENTIAL/PLATELET    Imaging Review No results found.   EKG Interpretation None      MDM   Final diagnoses:  Episode of shaking    His episodes of shaking are not consistent with seizures.    He also complains of several months of low back pain and gait instability.  He does not have any history findings or physical exam findings to suggest acute spinal pathology or warrant emergent MR imaging.  He has outpatient imaging scheduled already.    I suspect a component of his symptoms are related to anxiety.  His neurologist plans to start him on zoloft in addition to his  klonopin.    Labs unremarkable. Tremulousness stopped. DC home with outpatient follow-up.  Serita Grit, MD 09/19/14 [phone]

## 2014-09-18 NOTE — ED Notes (Signed)
MD spoke at length with family and patient.

## 2014-09-18 NOTE — Progress Notes (Signed)
NEUROLOGY FOLLOW UP OFFICE NOTE  [name] [id number]  HISTORY OF PRESENT ILLNESS: I had the pleasure of seeing [name] in follow-up in the neurology clinic on 09/18/2014.  The patient was last seen 2 months ago for recurrent tremors of unclear etiology, possibly due to neurodegenerative disease. He is again accompanied by his daughter who helps supplement the history today. On his last visit, he had been doing well with no further episodes of dizziness, loss of consciousness, or jerking/shaking since December 2015. He was reporting daytime drowsiness on Depakote, and dose was reduced to 250mg  in AM, 500mg  in PM. He presents today for an earlier visit due to recurrence of episode of shakiness while at a friend's funeral 2 weeks ago, as well as his PCP's concern about his gait. The patient reports he is worse now, "I can't walk." He denies any back pain today, but his daughter reports he has been complaining about back pain. He had previously been complaining about his legs burning, he denies this today. He denies weakness except when he walks. His legs feel cold even in the warm weather. His daughter feels that the shakiness he had 2 weeks ago was due to the stress of the funeral, he had not eaten lunch, and it was very hot. He does not recall much of this except telling his friend not to bring him to the hospital.  He denies feeling confused, but his daughter reports some confusion. They had agreed she would pick him up today, but when she called him at noon, he was already driving on his way to our office. She fills his pillbox every week, and saw that he did not take 2 days of his medications. He has been very irritable, and was very upset with his daughter recently. He takes clonazepam 0.5mg  TID.   As he was being checked out, he stood up then started having shakiness in both legs. He sat back down and proceeded to have tremulousness and irregular, asynchronous shaking of the arms and  legs. He was hyperventilating to the point of wheezing. In between he was able to look at me and answer questions if he was feeling scared or nervous ("I'm not scared"), he was able to tell me the 3 words I asked him to remember during memory testing earlier. The episode lasted around 10-15 minutes, vital signs were stable. At the end, he reported feeling that he felt like he would pass out. EMS arrived and he was transported to the ED.  HPI: This is a 80 yo RH man with a history of hypertension, CAD, CHF, DM in his usual state of health until 12/15/13 while walking the bathroom when he felt dizzy. He was brought to [facility] were MRI brain was unremarkable, with moderate generalized atrophy. CTA head and neck unremarkable. Orthostatic vital signs were negative and he was started on Valium for vertigo. He was discharged to rehab for vestibular therapy. At the SNF, he had a fall on 11/23 due to dizziness. His sister went to visit him after the fall, when he had an episode with arms up, eyes rolled back. His head was going back and forth sidebending left and right with shaking for 10-15 minutes. He was able to communicate although slowly during the event, per records, no loss of consciousness. No post-event confusion. He is amnestic of this episode. He was re-admitted at [facility] where TTE done showed EF 35-40%, mild LVH, diffuse hypokinesis. EEG was normal, 3 episodes of wooziness and  5 episodes of jerking/staring did not show EEG change. His family wanted to transfer to [facility], he was transferred on 01/06/14. During his stay, repeat MRI brain did not show acute changes. Orthostatic negative. Labs done showed normal B12 (221), CK (69), TSH (4.036). He was noted to have episodes of shaking when going from lying to sitting position, diagnosed as startle myoclonus. He was started on clonazepam 0.5mg  BID with note of improvement on discharge. He had a serum paraneoplastic panel sent, results unavailable for review. He did well at the  SNF for a couple of weeks, until the day before he was going to be discharged home, when he had 2 bouts of tremors. He refused to go to the ER then the next day had one in the shower. He was brought back to [facility] on 12/17 where clonazepam dose was increased and Keppra was added. He was discharged home after 2 days, then sent back that same day due to continued tremors. Keppra was discontinued, Depakote added. He has not had any further tremors since December 2015.  I personally reviewed MRI brain without contrast done 12/15/13 which showed moderate generalized atrophy, no acute changes. MRI C-spine without contrast showed mild spinal stenosis at C2-3, C3-4, stable and multifactorial with some ossification of the posterior longitudinal ligament suspected (OPLL). Multifactorial moderate or severe neural foraminal stenosis at bilateral C4, right C5, bilateral C6. Posterior element ankylosis at C4-5 with severe facet hypertrophy on the right. Mild multifactorial upper thoracic spinal stenosis at T2-3.   PAST MEDICAL HISTORY: Past Medical History  Diagnosis Date  . CAD (coronary artery disease)   . History of syncope   . HTN (hypertension)   . Borderline hyperlipidemia   . History of gastritis   . Diverticulosis of colon   . Colon polyps   . Hypertrophy of prostate with urinary obstruction and other lower urinary tract symptoms (LUTS)   . History of pyelonephritis   . DJD (degenerative joint disease)   . Anxiety   . DM (diabetes mellitus)     Adult onset  . 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 History of kidney stones   . Myoclonus     MEDICATIONS: Current Outpatient Prescriptions on File Prior to Visit  Medication Sig Dispense Refill  . amLODipine (NORVASC) 10 MG tablet Take 0.5 tablets (5 mg total) by mouth every morning.    Marland Kitchen aspirin EC 81 MG tablet Take 81 mg by mouth every morning.     . carvedilol (COREG) 12.5 MG tablet  Take 1 tablet (12.5 mg total) by mouth 2 (two) times daily with a meal. 180 tablet 1  . clonazePAM (KLONOPIN) 0.5 MG tablet Take 1.5 tablets (0.75 mg total) by mouth 3 (three) times daily. (Patient taking differently: Take 0.5 mg by mouth 3 (three) times daily. ) 270 tablet 1  . divalproex (DEPAKOTE) 500 MG DR tablet Take one half tablet (250 mg) by mouth each morning and 1 tablet (500 mg) by mouth each evening.    Marland Kitchen glimepiride (AMARYL) 4 MG tablet Take 0.5 tablets (2 mg total) by mouth daily before breakfast. 45 tablet 1  . hydrALAZINE (APRESOLINE) 50 MG tablet Take 1 tablet (50 mg total) by mouth 3 (three) times daily. 270 tablet 1  . hydrochlorothiazide (HYDRODIURIL) 25 MG tablet Take 25 mg by mouth daily.    . insulin aspart (NOVOLOG) 100 UNIT/ML injection Inject  4-10 Units into the skin 3 (three) times daily before meals.    . Insulin Glargine ([name] SOLOSTAR) 300 UNIT/ML SOPN Inject 12 Units into the skin at bedtime. 1 pen 0  . lisinopril-hydrochlorothiazide (PRINZIDE,ZESTORETIC) 20-25 MG per tablet Take 1 tablet by mouth every morning. 90 tablet 2  . potassium chloride SA (K-DUR,KLOR-CON) 20 MEQ tablet Take 1 tablet (20 mEq total) by mouth daily. 30 tablet 3  . tamsulosin (FLOMAX) 0.4 MG CAPS capsule Take 1 capsule (0.4 mg total) by mouth daily after supper. 30 capsule 3   No current facility-administered medications on file prior to visit.    ALLERGIES: Allergies  Allergen Reactions  . Codeine Phosphate Nausea Only    Can take with food and usually doesn't cause nausea  . Morphine Nausea Only    Can take with food and usually doesn't cause nausea  . Metformin And Related Diarrhea    FAMILY HISTORY: Family History  Problem Relation Age of Onset  . Heart attack Mother   . Dementia Brother   . Colon cancer Neg Hx   . Prostate cancer Neg Hx     SOCIAL HISTORY: History   Social History  . Marital Status: Widowed    Spouse Name: N/A  . Number of Children: 2  . Years of  Education: N/A   Occupational History  . Retired    Social History Main Topics  . Smoking status: Never Smoker   . Smokeless tobacco: Never Used  . Alcohol Use: No  . Drug Use: No  . Sexual Activity: Not on file   Other Topics Concern  . Not on file   Social History Narrative   Widowed 2006   2 kids   Enjoys hunting and fishing   Retired from [employer] and [employer] work    REVIEW OF SYSTEMS: Constitutional: No fevers, chills, or sweats, no generalized fatigue, change in appetite Eyes: No visual changes, double vision, eye pain Ear, nose and throat: No hearing loss, ear pain, nasal congestion, sore throat Cardiovascular: No chest pain, palpitations Respiratory:  No shortness of breath at rest or with exertion, wheezes GastrointestinaI: No nausea, vomiting, diarrhea, abdominal pain, fecal incontinence Genitourinary:  No dysuria, urinary retention or frequency Musculoskeletal:  No neck pain, back pain Integumentary: No rash, pruritus, skin lesions Neurological: as above Psychiatric: No depression, insomnia, anxiety Endocrine: No palpitations, fatigue, diaphoresis, mood swings, change in appetite, change in weight, increased thirst Hematologic/Lymphatic:  No anemia, purpura, petechiae. Allergic/Immunologic: no itchy/runny eyes, nasal congestion, recent allergic reactions, rashes  PHYSICAL EXAM: Filed Vitals:   09/18/14 1350  BP: 146/88  Pulse: 67  Resp: 18   General: No acute distress at the beginning of the visit. After the visit, he was upset that we had to do more tests. As he stood up to leave, he started having the episode described above, suggestive of a panic attack. Head:  Normocephalic/atraumatic Neck: supple, no paraspinal tenderness, full range of motion Heart:  Regular rate and rhythm Lungs:  Clear to auscultation bilaterally Back: No paraspinal tenderness Skin/Extremities: No rash, no edema Neurological Exam: alert and oriented to person, place, and time. No  aphasia or dysarthria. Fund of knowledge is appropriate. Remote memory intact. 1/3 delayed recall.  Attention and concentration are normal. Able to name objects and repeat phrases. Cranial nerves: CN I: not tested CN II: pupils equal, round and reactive to light, visual fields intact, fundi unremarkable. CN III, IV, VI: full range of motion, no nystagmus, no ptosis CN V: facial  sensation intact CN VII: upper and lower face symmetric CN VIII: hearing intact to finger rub CN IX, X: gag intact, uvula midline CN XI: sternocleidomastoid and trapezius muscles intact CN XII: tongue midline Bulk & Tone: +cogwheeling R>L with distraction (similar to prior), no fasciculations. Motor: 5/5 throughout with no pronator drift. Sensation: intact to light touch, pin, vibration, JPS. No extinction to double simultaneous stimulation. Romberg test: unable to do, patient stood up then started being tremulous and sat back down Deep Tendon Reflexes: +2 throughout, no ankle clonus Plantar responses: downgoing bilaterally Cerebellar: no incoordination on finger to nose and heel to shin testing Gait: able to stand with arms crossed over chest with slight difficulty (similar to prior), unable to test gait, he started having tremulousness of both legs and sat back down Tremor: as noted above  IMPRESSION: This is a 80 yo RH man with a history of hypertension, CAD, CHF, DM, who had sudden onset dizziness, followed by recurrent tremors, noted to be triggered with sitting or standing up, and was diagnosed as startle myoclonus at [facility]. MRI brain, prolonged EEG which captured these episodes, were unremarkable. Bloodwork unremarkable, paraneoplastic panel was sent at [facility] but unavailable for review. He had been symptom-free since December 2015, until 2 weeks ago at a friend's funeral. At the end of today's visit, he became upset with tests set up for him, and started hyperventilating with wheezing, with irregular and  asynchronous shaking of extremities, able to answer questions. The episode witnessed today was non-epileptic, highly suggestive of a panic attack. This was discussed with his daughter, and we have agreed to start him on an SSRI, in addition to clonazepam. He will start low dose Zoloft 25mg  daily, side effects were discussed with his daughter, who administers his medications. Continue current dose of Depakote. He had been reporting back pain and gait instability, MRI lumbar spine without contrast and EMG/NCV of both LE will be ordered, as well as neuropathy labs. However, after the episode witnessed in the office today, I wonder about a psychogenic cause for gait instability. He will be referred for balance therapy, as well as Home Health. He will follow-up after the tests.   Thank you for allowing me to participate in his care.  Please do not hesitate to call for any questions or concerns.  The duration of this appointment visit was 24 minutes of face-to-face time with the patient.  Greater than 50% of this time was spent in counseling, explanation of diagnosis, planning of further management, and coordination of care.   Ellouise Newer, M.D.   CC: Dr. Damita Dunnings

## 2014-09-20 ENCOUNTER — Telehealth: Payer: Self-pay

## 2014-09-20 NOTE — Telephone Encounter (Signed)
minimal elevation in his TSH. I wouldn't do anything about that and we can recheck it later on.  Elevated TSH = underactive thyroid, ie not likely to cause his sx.  Orders are in for recheck later on, in about 2 months.  Thanks.

## 2014-09-20 NOTE — Telephone Encounter (Signed)
Joy pts daughter( DPR signed) left v/m pt was taken to [facility] ED on 09/18/14;pt had another tremor attack. Pt wanted Dr Damita Dunnings to know as FYI. Joy wants to know if pt should be on thyroid med? Pt told Joy Dr Damita Dunnings told pt he had overactive thyroid. Joy request cb.

## 2014-09-21 ENCOUNTER — Telehealth: Payer: Self-pay | Admitting: Family Medicine

## 2014-09-21 NOTE — Telephone Encounter (Signed)
Referral faxed to [place] for skilled nursing.

## 2014-09-21 NOTE — Telephone Encounter (Signed)
-----   Message from Cameron Sprang, MD sent at 09/18/2014  3:39 PM EDT ----- Regarding: home health Can you pls also set him up with home health for medication management and home safety eval? Thanks

## 2014-09-21 NOTE — Telephone Encounter (Signed)
Left detailed message on voicemail with instructions to call in for the 2 mos lab appt.

## 2014-09-27 ENCOUNTER — Ambulatory Visit (HOSPITAL_COMMUNITY)
Admission: RE | Admit: 2014-09-27 | Discharge: 2014-09-27 | Disposition: A | Discharge status: Home / Self Care | Payer: Medicare Other | Source: Ambulatory Visit | Attending: Neurology | Admitting: Neurology

## 2014-09-27 DIAGNOSIS — M5126 Other intervertebral disc displacement, lumbar region: Secondary | ICD-10-CM | POA: Diagnosis not present

## 2014-09-27 DIAGNOSIS — M545 Low back pain: Secondary | ICD-10-CM | POA: Diagnosis present

## 2014-09-27 DIAGNOSIS — M5136 Other intervertebral disc degeneration, lumbar region: Secondary | ICD-10-CM | POA: Diagnosis not present

## 2014-09-27 DIAGNOSIS — M47816 Spondylosis without myelopathy or radiculopathy, lumbar region: Secondary | ICD-10-CM | POA: Diagnosis not present

## 2014-09-27 DIAGNOSIS — M47896 Other spondylosis, lumbar region: Secondary | ICD-10-CM | POA: Diagnosis not present

## 2014-09-27 DIAGNOSIS — M4806 Spinal stenosis, lumbar region: Secondary | ICD-10-CM | POA: Diagnosis not present

## 2014-09-28 ENCOUNTER — Telehealth: Payer: Self-pay | Admitting: Family Medicine

## 2014-09-28 NOTE — Telephone Encounter (Signed)
-----   Message from Cameron Sprang, MD sent at 09/27/2014  4:30 PM EDT ----- Pls let daughter know his MRI lumbar spine does show arthritis changes at several levels in his back, affecting the nerves in his back. Would refer to Ortho for further management, if she is agreeable. Thanks

## 2014-09-28 NOTE — Telephone Encounter (Signed)
Patient's daughter/Joy returned my call. She is going to talk with patient about results & orthopedic recommendation. If he is agreeable to seeing orthopedic doctor she will call me back so I can set up a referral.

## 2014-09-28 NOTE — Telephone Encounter (Signed)
Lmovm to return my call. 

## 2014-10-04 ENCOUNTER — Emergency Department (HOSPITAL_COMMUNITY): Payer: Medicare Other

## 2014-10-04 ENCOUNTER — Other Ambulatory Visit: Payer: Self-pay | Admitting: Family Medicine

## 2014-10-04 ENCOUNTER — Inpatient Hospital Stay (HOSPITAL_COMMUNITY)
Admission: EM | Admit: 2014-10-04 | Discharge: 2014-10-06 | DRG: 580 | Disposition: A | Discharge status: Home / Self Care | Payer: Medicare Other | Attending: Internal Medicine | Admitting: Internal Medicine

## 2014-10-04 ENCOUNTER — Encounter (HOSPITAL_COMMUNITY): Payer: Self-pay | Admitting: Emergency Medicine

## 2014-10-04 DIAGNOSIS — E669 Obesity, unspecified: Secondary | ICD-10-CM | POA: Diagnosis not present

## 2014-10-04 DIAGNOSIS — Z8249 Family history of ischemic heart disease and other diseases of the circulatory system: Secondary | ICD-10-CM

## 2014-10-04 DIAGNOSIS — Z794 Long term (current) use of insulin: Secondary | ICD-10-CM

## 2014-10-04 DIAGNOSIS — N179 Acute kidney failure, unspecified: Secondary | ICD-10-CM | POA: Diagnosis present

## 2014-10-04 DIAGNOSIS — Z7982 Long term (current) use of aspirin: Secondary | ICD-10-CM

## 2014-10-04 DIAGNOSIS — Z6833 Body mass index (BMI) 33.0-33.9, adult: Secondary | ICD-10-CM

## 2014-10-04 DIAGNOSIS — Z87442 Personal history of urinary calculi: Secondary | ICD-10-CM | POA: Diagnosis not present

## 2014-10-04 DIAGNOSIS — I2510 Atherosclerotic heart disease of native coronary artery without angina pectoris: Secondary | ICD-10-CM | POA: Diagnosis not present

## 2014-10-04 DIAGNOSIS — M00842 Arthritis due to other bacteria, left hand: Secondary | ICD-10-CM | POA: Diagnosis not present

## 2014-10-04 DIAGNOSIS — S61201A Unspecified open wound of left index finger without damage to nail, initial encounter: Secondary | ICD-10-CM | POA: Diagnosis not present

## 2014-10-04 DIAGNOSIS — N401 Enlarged prostate with lower urinary tract symptoms: Secondary | ICD-10-CM | POA: Diagnosis not present

## 2014-10-04 DIAGNOSIS — Z86010 Personal history of colonic polyps: Secondary | ICD-10-CM | POA: Diagnosis not present

## 2014-10-04 DIAGNOSIS — M009 Pyogenic arthritis, unspecified: Secondary | ICD-10-CM | POA: Diagnosis not present

## 2014-10-04 DIAGNOSIS — F411 Generalized anxiety disorder: Secondary | ICD-10-CM

## 2014-10-04 DIAGNOSIS — E1122 Type 2 diabetes mellitus with diabetic chronic kidney disease: Secondary | ICD-10-CM | POA: Diagnosis not present

## 2014-10-04 DIAGNOSIS — E876 Hypokalemia: Secondary | ICD-10-CM | POA: Diagnosis not present

## 2014-10-04 DIAGNOSIS — Z888 Allergy status to other drugs, medicaments and biological substances status: Secondary | ICD-10-CM

## 2014-10-04 DIAGNOSIS — E785 Hyperlipidemia, unspecified: Secondary | ICD-10-CM | POA: Diagnosis present

## 2014-10-04 DIAGNOSIS — M659 Synovitis and tenosynovitis, unspecified: Secondary | ICD-10-CM | POA: Diagnosis present

## 2014-10-04 DIAGNOSIS — L03114 Cellulitis of left upper limb: Secondary | ICD-10-CM | POA: Diagnosis present

## 2014-10-04 DIAGNOSIS — M1990 Unspecified osteoarthritis, unspecified site: Secondary | ICD-10-CM | POA: Diagnosis not present

## 2014-10-04 DIAGNOSIS — N189 Chronic kidney disease, unspecified: Secondary | ICD-10-CM | POA: Diagnosis not present

## 2014-10-04 DIAGNOSIS — L03012 Cellulitis of left finger: Secondary | ICD-10-CM | POA: Diagnosis not present

## 2014-10-04 DIAGNOSIS — E1129 Type 2 diabetes mellitus with other diabetic kidney complication: Secondary | ICD-10-CM | POA: Diagnosis present

## 2014-10-04 DIAGNOSIS — I10 Essential (primary) hypertension: Secondary | ICD-10-CM | POA: Diagnosis present

## 2014-10-04 DIAGNOSIS — R946 Abnormal results of thyroid function studies: Secondary | ICD-10-CM | POA: Diagnosis not present

## 2014-10-04 DIAGNOSIS — R001 Bradycardia, unspecified: Secondary | ICD-10-CM | POA: Diagnosis not present

## 2014-10-04 DIAGNOSIS — I252 Old myocardial infarction: Secondary | ICD-10-CM | POA: Diagnosis not present

## 2014-10-04 DIAGNOSIS — I129 Hypertensive chronic kidney disease with stage 1 through stage 4 chronic kidney disease, or unspecified chronic kidney disease: Secondary | ICD-10-CM | POA: Diagnosis not present

## 2014-10-04 DIAGNOSIS — G253 Myoclonus: Secondary | ICD-10-CM | POA: Diagnosis present

## 2014-10-04 DIAGNOSIS — I5042 Chronic combined systolic (congestive) and diastolic (congestive) heart failure: Secondary | ICD-10-CM | POA: Diagnosis not present

## 2014-10-04 DIAGNOSIS — Z79899 Other long term (current) drug therapy: Secondary | ICD-10-CM

## 2014-10-04 DIAGNOSIS — M79645 Pain in left finger(s): Secondary | ICD-10-CM | POA: Diagnosis not present

## 2014-10-04 DIAGNOSIS — R7989 Other specified abnormal findings of blood chemistry: Secondary | ICD-10-CM

## 2014-10-04 DIAGNOSIS — Z885 Allergy status to narcotic agent status: Secondary | ICD-10-CM | POA: Diagnosis not present

## 2014-10-04 DIAGNOSIS — I152 Hypertension secondary to endocrine disorders: Secondary | ICD-10-CM | POA: Diagnosis present

## 2014-10-04 DIAGNOSIS — E1159 Type 2 diabetes mellitus with other circulatory complications: Secondary | ICD-10-CM | POA: Diagnosis present

## 2014-10-04 MED ORDER — CLINDAMYCIN HCL 300 MG PO CAPS
300.0000 mg | ORAL_CAPSULE | Freq: Once | ORAL | Status: AC
  Administered 2014-10-05: 300 mg via ORAL
  Filled 2014-10-04: qty 1

## 2014-10-04 NOTE — ED Notes (Signed)
Pt states he was doing yard work today and tonight his left index finger is red and swollen  Pt states he tried soaking it in epsom salt but it did not help  The finger has an open area that is oozing clear liquid

## 2014-10-04 NOTE — ED Provider Notes (Signed)
CSN: [id number]     Arrival date & time 10/04/14  2204 History  This chart was scribed for [name] Creamer, NP, working with Rolland Porter, MD by Steva Colder, ED Scribe. The patient was seen in room WA01/WA01 at 11:34 PM.    Chief Complaint  Patient presents with  . Finger Injury      The history is provided by the patient. No language interpreter was used.    [name] is a 80 y.o. male with a medical hx of DM who presents to the Emergency Department complaining of left index finger injury onset tonight. Pt notes that he was in his garden doing yardwork when the injury occurred. Pt notes that he soaked it in epsom salt and then the area burst and there was a "plain water" appearing drainage. Pt notes that the pain is radiating up his arm. Pt is having associated symptoms of joint swelling, redness, wound. He notes that he has tried aleve at 4 PM with no relief of his symptoms. He denies rash, and any other symptoms.    Past Medical History  Diagnosis Date  . CAD (coronary artery disease)   . History of syncope   . HTN (hypertension)   . Borderline hyperlipidemia   . History of gastritis   . Diverticulosis of colon   . Colon polyps   . Hypertrophy of prostate with urinary obstruction and other lower urinary tract symptoms (LUTS)   . History of pyelonephritis   . DJD (degenerative joint disease)   . Anxiety   . DM (diabetes mellitus)     Adult onset  . 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 History of kidney stones   . Myoclonus    Past Surgical History  Procedure Laterality Date  . Appendectomy    . Cataract extraction      RIGHT EYE  . Knee surgery      bilateral ARTHROSCOPY X2 TO EACH KNEE  . Shoulder surgery  04/2005    left by Dr. Berenice Primas  . 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 S/p cysto & stents for kidnedy stone 11/08 by dr. Reece Agar     . Cystoscopy  04/17/11    stent placed  . Cystoscopy w/ ureteral stent placement  04/17/2011    Procedure: CYSTOSCOPY WITH RETROGRADE PYELOGRAM/URETERAL STENT PLACEMENT;  Surgeon: Hanley Ben, MD;  Location: [facility]S;  Service: Urology;  Laterality: Left;  . Cystoscopy with retrograde pyelogram, ureteroscopy and stent placement Left 04/18/2013    Procedure: CYSTOSCOPY WITH LEFT RETROGRADE PYELOGRAM, LEFT URETEROSCOPY AND LASER LITHOTRIPSY LEFT STENT PLACEMENT;  Surgeon: Dutch Gray, MD;  Location: [facility];  Service: Urology;  Laterality: Left;  . Holmium laser application Left 08/11/945    Procedure: HOLMIUM LASER APPLICATION;  Surgeon: Dutch Gray, MD;  Location: [facility];  Service: Urology;  Laterality: Left;  . Cholecystectomy     Family History  Problem Relation Age of Onset  . Heart attack Mother   . Dementia Brother   . Colon cancer Neg Hx   . Prostate cancer Neg Hx    Social History  Substance Use Topics  . Smoking status: Never Smoker   . Smokeless tobacco: Never Used  . Alcohol Use: No    Review of Systems  Musculoskeletal: Positive for joint swelling and arthralgias.  Skin: Positive  for color change and wound. Negative for rash.      Allergies  Codeine phosphate; Morphine; and Metformin and related  Home Medications   Prior to Admission medications   Medication Sig Start Date End Date Taking? Authorizing Provider  amLODipine (NORVASC) 10 MG tablet Take 0.5 tablets (5 mg total) by mouth every morning. 08/17/14   Tonia Ghent, MD  aspirin EC 81 MG tablet Take 81 mg by mouth every evening.     Historical Provider, MD  carvedilol (COREG) 12.5 MG tablet Take 1 tablet (12.5 mg total) by mouth 2 (two) times daily with a meal. 05/09/14   Tonia Ghent, MD  Cinnamon 500 MG capsule Take 500 mg by mouth 3 (three) times daily.    Historical Provider, MD  clonazePAM (KLONOPIN) 0.5 MG tablet Take 1.5 tablets (0.75 mg total) by mouth 3 (three) times daily. Patient taking differently:  Take 0.5 mg by mouth 3 (three) times daily.  05/23/14   Tonia Ghent, MD  COD LIVER OIL PO Take 1 capsule by mouth 3 (three) times daily.    Historical Provider, MD  divalproex (DEPAKOTE) 500 MG DR tablet Take one half tablet (250 mg) by mouth each morning and 1 tablet (500 mg) by mouth each evening. 09/08/14   Tonia Ghent, MD  glimepiride (AMARYL) 4 MG tablet Take 0.5 tablets (2 mg total) by mouth daily before breakfast. 06/02/14   Tonia Ghent, MD  hydrALAZINE (APRESOLINE) 50 MG tablet Take 1 tablet (50 mg total) by mouth 3 (three) times daily. 05/23/14   Tonia Ghent, MD  hydrochlorothiazide (HYDRODIURIL) 25 MG tablet Take 25 mg by mouth daily.    Historical Provider, MD  insulin aspart (NOVOLOG) 100 UNIT/ML injection Inject 4-10 Units into the skin 3 (three) times daily before meals. Sliding scale    Historical Provider, MD  Insulin Glargine ([name] SOLOSTAR) 300 UNIT/ML SOPN Inject 12 Units into the skin at bedtime. 06/23/14   Tonia Ghent, MD  lisinopril-hydrochlorothiazide [facility]) 20-25 MG per tablet Take 1 tablet by mouth every morning. 08/03/14   Tonia Ghent, MD  naproxen sodium (ANAPROX) 220 MG tablet Take 440 mg by mouth 2 (two) times daily as needed (leg pain).    Historical Provider, MD  potassium chloride SA (K-DUR,KLOR-CON) 20 MEQ tablet Take 1 tablet (20 mEq total) by mouth daily. Patient taking differently: Take 20 mEq by mouth every evening.  05/12/14   Tonia Ghent, MD  sertraline (ZOLOFT) 25 MG tablet Take 1 tablet daily Patient not taking: Reported on 09/18/2014 09/18/14   Cameron Sprang, MD  tamsulosin [facility]) 0.4 MG CAPS capsule Take 1 capsule (0.4 mg total) by mouth daily after supper. 05/12/14   Tonia Ghent, MD   BP 151/76 mmHg  Pulse 96  Temp(Src) 98.3 F (36.8 C) (Oral)  Resp 22  SpO2 97% Physical Exam  Constitutional: He is oriented to person, place, and time. He appears well-developed and well-nourished. No distress.  HENT:  Head:  Normocephalic and atraumatic.  Eyes: EOM are normal.  Neck: Neck supple. No tracheal deviation present.  Cardiovascular: Normal rate.   Pulmonary/Chest: Effort normal. No respiratory distress.  Musculoskeletal: Normal range of motion.  Neurological: He is alert and oriented to person, place, and time.  Skin: Skin is warm and dry. There is erythema.  Left index finger: Red swollen tender finger with a medial open lesion approximately 2 mm wide with purulent drainage between the cuticle and DIP joint with  pain that radiates to his elbow.  Psychiatric: He has a normal mood and affect. His behavior is normal.  Nursing note and vitals reviewed.   ED Course  Procedures (including critical care time) DIAGNOSTIC STUDIES: Oxygen Saturation is 96% on RA, nl by my interpretation.    COORDINATION OF CARE: 11:37 PM Discussed treatment plan with pt at bedside and pt agreed to plan.   Labs Review Labs Reviewed  CBC WITH DIFFERENTIAL/PLATELET - Abnormal; Notable for the following:    Monocytes Relative 14 (*)    Monocytes Absolute 1.4 (*)    All other components within normal limits  I-STAT CHEM 8, ED - Abnormal; Notable for the following:    BUN 30 (*)    Creatinine, Ser 1.30 (*)    Glucose, Bld 164 (*)    Calcium, Ion 1.12 (*)    All other components within normal limits  CULTURE, BLOOD (ROUTINE X 2)  CULTURE, BLOOD (ROUTINE X 2)    Imaging Review Dg Finger Index Left  10/05/2014   CLINICAL DATA:  Finger pain and swelling all day. Small oozing wound from medial finger. Working in his garden this morning. Question foreign body.  EXAM: LEFT INDEX FINGER 2+V  COMPARISON:  None.  FINDINGS: There is a faint 3 mm in density in the distal finger about the radial dorsal aspect subcutaneous tissues that may reflect small foreign body. No fracture or dislocation. No erosion or periosteal reaction. Osteoarthritis with joint space narrowing, osteophytes, and subchondral cystic change involving the  entire digit.  IMPRESSION: Questionable faint 3 mm foreign body in the radial dorsal finger.   Electronically Signed   By: Jeb Levering M.D.   On: 10/05/2014 00:17   I have personally reviewed and evaluated these images and lab results as part of my medical decision-making.   EKG Interpretation   Date/Time:  Thursday October 05 2014 02:28:09 EDT Ventricular Rate:  49 PR Interval:  192 QRS Duration: 115 QT Interval:  493 QTC Calculation: 445 R Axis:   15 Text Interpretation:  Slow sinus arrhythmia Nonspecific intraventricular  conduction delay Since last tracing rate slower  (19 Dec 201)5 Confirmed  by KNAPP  MD-I, IVA ([id number]) on 10/05/2014 2:33:40 AM     I spoke with Dr. Lenon Curt.  He will take the patient to the OR to wash out the tendon sheath.  He has been given clindamycin by mouth as well as ankle minus an IV  MDM   Final diagnoses:  Tenosynovitis of finger    I personally performed the services described in this documentation, which was scribed in my presence. The recorded information has been reviewed and is accurate.   [name] Creamer, NP 10/05/14 [place], MD 10/05/14 [phone]

## 2014-10-05 ENCOUNTER — Emergency Department (HOSPITAL_COMMUNITY): Payer: Medicare Other | Admitting: Certified Registered Nurse Anesthetist

## 2014-10-05 ENCOUNTER — Encounter (HOSPITAL_COMMUNITY)
Admission: EM | Disposition: A | Discharge status: Home / Self Care | Payer: Self-pay | Source: Home / Self Care | Attending: Internal Medicine

## 2014-10-05 ENCOUNTER — Encounter (HOSPITAL_COMMUNITY): Payer: Self-pay | Admitting: Certified Registered Nurse Anesthetist

## 2014-10-05 DIAGNOSIS — L03114 Cellulitis of left upper limb: Secondary | ICD-10-CM | POA: Diagnosis present

## 2014-10-05 DIAGNOSIS — E876 Hypokalemia: Secondary | ICD-10-CM

## 2014-10-05 DIAGNOSIS — F411 Generalized anxiety disorder: Secondary | ICD-10-CM

## 2014-10-05 DIAGNOSIS — I252 Old myocardial infarction: Secondary | ICD-10-CM | POA: Diagnosis not present

## 2014-10-05 DIAGNOSIS — E1122 Type 2 diabetes mellitus with diabetic chronic kidney disease: Secondary | ICD-10-CM | POA: Diagnosis not present

## 2014-10-05 DIAGNOSIS — Z888 Allergy status to other drugs, medicaments and biological substances status: Secondary | ICD-10-CM | POA: Diagnosis not present

## 2014-10-05 DIAGNOSIS — M659 Synovitis and tenosynovitis, unspecified: Secondary | ICD-10-CM | POA: Diagnosis present

## 2014-10-05 DIAGNOSIS — Z86010 Personal history of colonic polyps: Secondary | ICD-10-CM | POA: Diagnosis not present

## 2014-10-05 DIAGNOSIS — N189 Chronic kidney disease, unspecified: Secondary | ICD-10-CM

## 2014-10-05 DIAGNOSIS — R001 Bradycardia, unspecified: Secondary | ICD-10-CM | POA: Diagnosis not present

## 2014-10-05 DIAGNOSIS — I5042 Chronic combined systolic (congestive) and diastolic (congestive) heart failure: Secondary | ICD-10-CM | POA: Diagnosis not present

## 2014-10-05 DIAGNOSIS — Z7982 Long term (current) use of aspirin: Secondary | ICD-10-CM | POA: Diagnosis not present

## 2014-10-05 DIAGNOSIS — M00842 Arthritis due to other bacteria, left hand: Secondary | ICD-10-CM | POA: Diagnosis not present

## 2014-10-05 DIAGNOSIS — Z79899 Other long term (current) drug therapy: Secondary | ICD-10-CM | POA: Diagnosis not present

## 2014-10-05 DIAGNOSIS — Z885 Allergy status to narcotic agent status: Secondary | ICD-10-CM | POA: Diagnosis not present

## 2014-10-05 DIAGNOSIS — I998 Other disorder of circulatory system: Secondary | ICD-10-CM | POA: Diagnosis not present

## 2014-10-05 DIAGNOSIS — N179 Acute kidney failure, unspecified: Secondary | ICD-10-CM | POA: Diagnosis not present

## 2014-10-05 DIAGNOSIS — L03012 Cellulitis of left finger: Secondary | ICD-10-CM | POA: Diagnosis not present

## 2014-10-05 DIAGNOSIS — Z8249 Family history of ischemic heart disease and other diseases of the circulatory system: Secondary | ICD-10-CM | POA: Diagnosis not present

## 2014-10-05 DIAGNOSIS — Z6833 Body mass index (BMI) 33.0-33.9, adult: Secondary | ICD-10-CM | POA: Diagnosis not present

## 2014-10-05 DIAGNOSIS — G253 Myoclonus: Secondary | ICD-10-CM | POA: Diagnosis present

## 2014-10-05 DIAGNOSIS — I129 Hypertensive chronic kidney disease with stage 1 through stage 4 chronic kidney disease, or unspecified chronic kidney disease: Secondary | ICD-10-CM | POA: Diagnosis present

## 2014-10-05 DIAGNOSIS — M009 Pyogenic arthritis, unspecified: Secondary | ICD-10-CM | POA: Diagnosis not present

## 2014-10-05 DIAGNOSIS — Z794 Long term (current) use of insulin: Secondary | ICD-10-CM | POA: Diagnosis not present

## 2014-10-05 DIAGNOSIS — N401 Enlarged prostate with lower urinary tract symptoms: Secondary | ICD-10-CM | POA: Diagnosis present

## 2014-10-05 DIAGNOSIS — L0889 Other specified local infections of the skin and subcutaneous tissue: Secondary | ICD-10-CM | POA: Diagnosis not present

## 2014-10-05 DIAGNOSIS — R946 Abnormal results of thyroid function studies: Secondary | ICD-10-CM | POA: Diagnosis not present

## 2014-10-05 DIAGNOSIS — M79645 Pain in left finger(s): Secondary | ICD-10-CM | POA: Diagnosis present

## 2014-10-05 DIAGNOSIS — Z87442 Personal history of urinary calculi: Secondary | ICD-10-CM | POA: Diagnosis not present

## 2014-10-05 DIAGNOSIS — I10 Essential (primary) hypertension: Secondary | ICD-10-CM | POA: Diagnosis not present

## 2014-10-05 DIAGNOSIS — E669 Obesity, unspecified: Secondary | ICD-10-CM | POA: Diagnosis present

## 2014-10-05 DIAGNOSIS — E1169 Type 2 diabetes mellitus with other specified complication: Secondary | ICD-10-CM | POA: Diagnosis not present

## 2014-10-05 DIAGNOSIS — I2510 Atherosclerotic heart disease of native coronary artery without angina pectoris: Secondary | ICD-10-CM | POA: Diagnosis present

## 2014-10-05 DIAGNOSIS — M1990 Unspecified osteoarthritis, unspecified site: Secondary | ICD-10-CM | POA: Diagnosis present

## 2014-10-05 DIAGNOSIS — E785 Hyperlipidemia, unspecified: Secondary | ICD-10-CM | POA: Diagnosis present

## 2014-10-05 HISTORY — PX: IRRIGATION AND DEBRIDEMENT ABSCESS: SHX5252

## 2014-10-05 LAB — CBC WITH DIFFERENTIAL/PLATELET
BASOS PCT: 0 % (ref 0–1)
BASOS PCT: 0 % (ref 0–1)
Basophils Absolute: 0 10*3/uL (ref 0.0–0.1)
Basophils Absolute: 0 10*3/uL (ref 0.0–0.1)
Eosinophils Absolute: 0.3 10*3/uL (ref 0.0–0.7)
Eosinophils Absolute: 0.3 10*3/uL (ref 0.0–0.7)
Eosinophils Relative: 3 % (ref 0–5)
Eosinophils Relative: 4 % (ref 0–5)
HCT: 46.2 % (ref 39.0–52.0)
HEMATOCRIT: 42.3 % (ref 39.0–52.0)
HEMOGLOBIN: 14.1 g/dL (ref 13.0–17.0)
Hemoglobin: 15.4 g/dL (ref 13.0–17.0)
LYMPHS ABS: 2 10*3/uL (ref 0.7–4.0)
LYMPHS PCT: 27 % (ref 12–46)
Lymphocytes Relative: 32 % (ref 12–46)
Lymphs Abs: 2.6 10*3/uL (ref 0.7–4.0)
MCH: 32.2 pg (ref 26.0–34.0)
MCH: 32.3 pg (ref 26.0–34.0)
MCHC: 33.3 g/dL (ref 30.0–36.0)
MCHC: 33.3 g/dL (ref 30.0–36.0)
MCV: 96.7 fL (ref 78.0–100.0)
MCV: 97 fL (ref 78.0–100.0)
MONO ABS: 0.9 10*3/uL (ref 0.1–1.0)
MONO ABS: 1.4 10*3/uL — AB (ref 0.1–1.0)
MONOS PCT: 14 % — AB (ref 3–12)
MONOS PCT: 15 % — AB (ref 3–12)
NEUTROS ABS: 3.1 10*3/uL (ref 1.7–7.7)
NEUTROS PCT: 49 % (ref 43–77)
Neutro Abs: 5.3 10*3/uL (ref 1.7–7.7)
Neutrophils Relative %: 56 % (ref 43–77)
Platelets: 233 10*3/uL (ref 150–400)
Platelets: 259 10*3/uL (ref 150–400)
RBC: 4.36 MIL/uL (ref 4.22–5.81)
RBC: 4.78 MIL/uL (ref 4.22–5.81)
RDW: 13.9 % (ref 11.5–15.5)
RDW: 13.9 % (ref 11.5–15.5)
WBC: 6.3 10*3/uL (ref 4.0–10.5)
WBC: 9.5 10*3/uL (ref 4.0–10.5)

## 2014-10-05 LAB — COMPREHENSIVE METABOLIC PANEL
ALBUMIN: 3.9 g/dL (ref 3.5–5.0)
ALK PHOS: 48 U/L (ref 38–126)
ALT: 29 U/L (ref 17–63)
ANION GAP: 7 (ref 5–15)
AST: 21 U/L (ref 15–41)
BUN: 24 mg/dL — ABNORMAL HIGH (ref 6–20)
CALCIUM: 8.8 mg/dL — AB (ref 8.9–10.3)
CHLORIDE: 104 mmol/L (ref 101–111)
CO2: 28 mmol/L (ref 22–32)
CREATININE: 1.24 mg/dL (ref 0.61–1.24)
GFR calc Af Amer: >60 mL/min (ref >60)
GFR calc non Af Amer: 54 mL/min — ABNORMAL LOW (ref >60)
GLUCOSE: 141 mg/dL — AB (ref 65–99)
Potassium: 3.2 mmol/L — ABNORMAL LOW (ref 3.5–5.1)
SODIUM: 139 mmol/L (ref 135–145)
Total Bilirubin: 0.6 mg/dL (ref 0.3–1.2)
Total Protein: 6.4 g/dL — ABNORMAL LOW (ref 6.5–8.1)

## 2014-10-05 LAB — I-STAT CHEM 8, ED
BUN: 30 mg/dL — ABNORMAL HIGH (ref 6–20)
Calcium, Ion: 1.12 mmol/L — ABNORMAL LOW (ref 1.13–1.30)
Chloride: 103 mmol/L (ref 101–111)
Creatinine, Ser: 1.3 mg/dL — ABNORMAL HIGH (ref 0.61–1.24)
GLUCOSE: 164 mg/dL — AB (ref 65–99)
HCT: 47 % (ref 39.0–52.0)
HEMOGLOBIN: 16 g/dL (ref 13.0–17.0)
Potassium: 3.6 mmol/L (ref 3.5–5.1)
Sodium: 141 mmol/L (ref 135–145)
TCO2: 23 mmol/L (ref 0–100)

## 2014-10-05 LAB — GRAM STAIN

## 2014-10-05 LAB — TSH: TSH: 15.045 u[IU]/mL — AB (ref 0.350–4.500)

## 2014-10-05 LAB — GLUCOSE, CAPILLARY
GLUCOSE-CAPILLARY: 150 mg/dL — AB (ref 65–99)
Glucose-Capillary: 134 mg/dL — ABNORMAL HIGH (ref 65–99)
Glucose-Capillary: 143 mg/dL — ABNORMAL HIGH (ref 65–99)
Glucose-Capillary: 112 mg/dL — ABNORMAL HIGH (ref 65–99)
Glucose-Capillary: 179 mg/dL — ABNORMAL HIGH (ref 65–99)

## 2014-10-05 LAB — MAGNESIUM: Magnesium: 1.8 mg/dL (ref 1.7–2.4)

## 2014-10-05 LAB — VALPROIC ACID LEVEL

## 2014-10-05 SURGERY — MINOR INCISION AND DRAINAGE OF ABSCESS
Anesthesia: General | Site: Hand | Laterality: Left

## 2014-10-05 MED ORDER — SERTRALINE HCL 25 MG PO TABS
25.0000 mg | ORAL_TABLET | Freq: Every day | ORAL | Status: DC
  Administered 2014-10-05 – 2014-10-06 (×2): 25 mg via ORAL
  Filled 2014-10-05 (×2): qty 1

## 2014-10-05 MED ORDER — INSULIN ASPART 100 UNIT/ML ~~LOC~~ SOLN
0.0000 [IU] | Freq: Three times a day (TID) | SUBCUTANEOUS | Status: DC
Start: 2014-10-05 — End: 2014-10-06
  Administered 2014-10-05 – 2014-10-06 (×3): 1 [IU] via SUBCUTANEOUS

## 2014-10-05 MED ORDER — LISINOPRIL-HYDROCHLOROTHIAZIDE 20-25 MG PO TABS: 1.0000 | ORAL_TABLET | Freq: Every morning | ORAL | Status: DC

## 2014-10-05 MED ORDER — HYDROCHLOROTHIAZIDE 25 MG PO TABS
25.0000 mg | ORAL_TABLET | Freq: Every day | ORAL | Status: DC
  Administered 2014-10-05: 25 mg via ORAL
  Filled 2014-10-05 (×2): qty 1

## 2014-10-05 MED ORDER — GLIMEPIRIDE 2 MG PO TABS
2.0000 mg | ORAL_TABLET | Freq: Every day | ORAL | Status: DC
  Administered 2014-10-05 – 2014-10-06 (×2): 2 mg via ORAL
  Filled 2014-10-05 (×3): qty 1

## 2014-10-05 MED ORDER — FENTANYL CITRATE (PF) 100 MCG/2ML IJ SOLN
INTRAMUSCULAR | Status: AC
  Filled 2014-10-05: qty 4

## 2014-10-05 MED ORDER — LIDOCAINE HCL (CARDIAC) 20 MG/ML IV SOLN
INTRAVENOUS | Status: AC
  Filled 2014-10-05: qty 5

## 2014-10-05 MED ORDER — DIVALPROEX SODIUM 250 MG PO DR TAB
250.0000 mg | DELAYED_RELEASE_TABLET | Freq: Every day | ORAL | Status: DC
  Administered 2014-10-05 – 2014-10-06 (×2): 250 mg via ORAL
  Filled 2014-10-05 (×2): qty 1

## 2014-10-05 MED ORDER — CARVEDILOL 12.5 MG PO TABS
12.5000 mg | ORAL_TABLET | Freq: Two times a day (BID) | ORAL | Status: DC
  Administered 2014-10-05 – 2014-10-06 (×3): 12.5 mg via ORAL
  Filled 2014-10-05 (×5): qty 1

## 2014-10-05 MED ORDER — POTASSIUM CHLORIDE CRYS ER 20 MEQ PO TBCR: 20.0000 meq | EXTENDED_RELEASE_TABLET | Freq: Every evening | ORAL | Status: DC

## 2014-10-05 MED ORDER — FENTANYL CITRATE (PF) 100 MCG/2ML IJ SOLN: INTRAMUSCULAR | Status: DC | PRN

## 2014-10-05 MED ORDER — VANCOMYCIN HCL IN DEXTROSE 1-5 GM/200ML-% IV SOLN
1000.0000 mg | Freq: Once | INTRAVENOUS | Status: AC
  Administered 2014-10-05 (×2): 1000 mg via INTRAVENOUS
  Filled 2014-10-05: qty 200

## 2014-10-05 MED ORDER — LIDOCAINE HCL (CARDIAC) 20 MG/ML IV SOLN
INTRAVENOUS | Status: DC | PRN
  Administered 2014-10-05: 75 mg via INTRAVENOUS

## 2014-10-05 MED ORDER — 0.9 % SODIUM CHLORIDE (POUR BTL) OPTIME
TOPICAL | Status: DC | PRN
  Administered 2014-10-05: 1000 mL

## 2014-10-05 MED ORDER — METOCLOPRAMIDE HCL 5 MG/ML IJ SOLN
INTRAMUSCULAR | Status: DC | PRN
  Administered 2014-10-05: 5 mg via INTRAVENOUS

## 2014-10-05 MED ORDER — PIPERACILLIN-TAZOBACTAM 3.375 G IVPB
3.3750 g | Freq: Three times a day (TID) | INTRAVENOUS | Status: DC
  Administered 2014-10-05 – 2014-10-06 (×5): 3.375 g via INTRAVENOUS
  Filled 2014-10-05 (×6): qty 50

## 2014-10-05 MED ORDER — HYDROCHLOROTHIAZIDE 25 MG PO TABS
25.0000 mg | ORAL_TABLET | Freq: Every day | ORAL | Status: DC
  Administered 2014-10-05 – 2014-10-06 (×2): 25 mg via ORAL
  Filled 2014-10-05 (×2): qty 1

## 2014-10-05 MED ORDER — TRAMADOL HCL 50 MG PO TABS: 100.0000 mg | ORAL_TABLET | Freq: Four times a day (QID) | ORAL | Status: DC | PRN

## 2014-10-05 MED ORDER — VANCOMYCIN HCL 10 G IV SOLR
1250.0000 mg | INTRAVENOUS | Status: DC
  Administered 2014-10-05: 1250 mg via INTRAVENOUS
  Filled 2014-10-05 (×2): qty 1250

## 2014-10-05 MED ORDER — LACTATED RINGERS IV SOLN
INTRAVENOUS | Status: DC | PRN
  Administered 2014-10-05: 03:00:00 via INTRAVENOUS

## 2014-10-05 MED ORDER — HYDRALAZINE HCL 50 MG PO TABS
50.0000 mg | ORAL_TABLET | Freq: Three times a day (TID) | ORAL | Status: DC
  Administered 2014-10-05 – 2014-10-06 (×4): 50 mg via ORAL
  Filled 2014-10-05 (×6): qty 1

## 2014-10-05 MED ORDER — MIDAZOLAM HCL 2 MG/2ML IJ SOLN
INTRAMUSCULAR | Status: AC
  Filled 2014-10-05: qty 4

## 2014-10-05 MED ORDER — ONDANSETRON HCL 4 MG/2ML IJ SOLN
INTRAMUSCULAR | Status: AC
  Filled 2014-10-05: qty 2

## 2014-10-05 MED ORDER — BUPIVACAINE HCL (PF) 0.25 % IJ SOLN
INTRAMUSCULAR | Status: DC | PRN
  Administered 2014-10-05: 10 mL

## 2014-10-05 MED ORDER — GLYCOPYRROLATE 0.2 MG/ML IJ SOLN
INTRAMUSCULAR | Status: DC | PRN
  Administered 2014-10-05: 0.2 mg via INTRAVENOUS

## 2014-10-05 MED ORDER — ACETAMINOPHEN 650 MG RE SUPP: 650.0000 mg | Freq: Four times a day (QID) | RECTAL | Status: DC | PRN

## 2014-10-05 MED ORDER — ONDANSETRON HCL 4 MG/2ML IJ SOLN
INTRAMUSCULAR | Status: DC | PRN
  Administered 2014-10-05: 4 mg via INTRAVENOUS

## 2014-10-05 MED ORDER — INSULIN GLARGINE 100 UNIT/ML ~~LOC~~ SOLN
12.0000 [IU] | Freq: Every day | SUBCUTANEOUS | Status: DC
  Administered 2014-10-05: 12 [IU] via SUBCUTANEOUS
  Filled 2014-10-05 (×2): qty 0.12

## 2014-10-05 MED ORDER — FENTANYL CITRATE (PF) 100 MCG/2ML IJ SOLN
INTRAMUSCULAR | Status: DC | PRN
  Administered 2014-10-05: 50 ug via INTRAVENOUS

## 2014-10-05 MED ORDER — SODIUM CHLORIDE 0.9 % IV SOLN
INTRAVENOUS | Status: AC
  Administered 2014-10-05: 05:00:00 via INTRAVENOUS

## 2014-10-05 MED ORDER — ONDANSETRON HCL 4 MG/2ML IJ SOLN: 4.0000 mg | Freq: Four times a day (QID) | INTRAMUSCULAR | Status: DC | PRN

## 2014-10-05 MED ORDER — DIVALPROEX SODIUM 500 MG PO DR TAB
500.0000 mg | DELAYED_RELEASE_TABLET | Freq: Every day | ORAL | Status: DC
  Administered 2014-10-05: 500 mg via ORAL
  Filled 2014-10-05 (×2): qty 1

## 2014-10-05 MED ORDER — LISINOPRIL 20 MG PO TABS
20.0000 mg | ORAL_TABLET | Freq: Every day | ORAL | Status: DC
  Administered 2014-10-05 – 2014-10-06 (×2): 20 mg via ORAL
  Filled 2014-10-05 (×2): qty 1

## 2014-10-05 MED ORDER — TAMSULOSIN HCL 0.4 MG PO CAPS
0.4000 mg | ORAL_CAPSULE | Freq: Every day | ORAL | Status: DC
  Administered 2014-10-05: 0.4 mg via ORAL
  Filled 2014-10-05 (×2): qty 1

## 2014-10-05 MED ORDER — POTASSIUM CHLORIDE CRYS ER 20 MEQ PO TBCR
40.0000 meq | EXTENDED_RELEASE_TABLET | Freq: Every day | ORAL | Status: DC
  Administered 2014-10-05 – 2014-10-06 (×2): 40 meq via ORAL
  Filled 2014-10-05 (×2): qty 2

## 2014-10-05 MED ORDER — SODIUM CHLORIDE 0.9 % IV SOLN
Freq: Once | INTRAVENOUS | Status: AC
  Administered 2014-10-05: 02:00:00 via INTRAVENOUS

## 2014-10-05 MED ORDER — FENTANYL CITRATE (PF) 100 MCG/2ML IJ SOLN
25.0000 ug | INTRAMUSCULAR | Status: DC | PRN
  Administered 2014-10-05 (×2): 25 ug via INTRAVENOUS
  Filled 2014-10-05 (×2): qty 2

## 2014-10-05 MED ORDER — HYDRALAZINE HCL 20 MG/ML IJ SOLN: 10.0000 mg | INTRAMUSCULAR | Status: DC | PRN

## 2014-10-05 MED ORDER — CLONAZEPAM 0.5 MG PO TABS
0.5000 mg | ORAL_TABLET | Freq: Three times a day (TID) | ORAL | Status: DC
  Administered 2014-10-05 – 2014-10-06 (×4): 0.5 mg via ORAL
  Filled 2014-10-05 (×4): qty 1

## 2014-10-05 MED ORDER — ONDANSETRON HCL 4 MG PO TABS
4.0000 mg | ORAL_TABLET | Freq: Four times a day (QID) | ORAL | Status: DC | PRN
Start: 2014-10-05 — End: 2014-10-06

## 2014-10-05 MED ORDER — FENTANYL CITRATE (PF) 100 MCG/2ML IJ SOLN: 25.0000 ug | INTRAMUSCULAR | Status: DC | PRN

## 2014-10-05 MED ORDER — PROPOFOL 10 MG/ML IV BOLUS
INTRAVENOUS | Status: DC | PRN
  Administered 2014-10-05: 150 mg via INTRAVENOUS

## 2014-10-05 MED ORDER — AMLODIPINE BESYLATE 5 MG PO TABS
5.0000 mg | ORAL_TABLET | Freq: Every morning | ORAL | Status: DC
  Administered 2014-10-05 – 2014-10-06 (×2): 5 mg via ORAL
  Filled 2014-10-05 (×2): qty 1

## 2014-10-05 MED ORDER — BUPIVACAINE HCL (PF) 0.25 % IJ SOLN
INTRAMUSCULAR | Status: AC
Start: 2014-10-05 — End: 2014-10-05
  Filled 2014-10-05: qty 30

## 2014-10-05 MED ORDER — PROPOFOL 10 MG/ML IV BOLUS
INTRAVENOUS | Status: AC
  Filled 2014-10-05: qty 20

## 2014-10-05 MED ORDER — EPHEDRINE SULFATE 50 MG/ML IJ SOLN
INTRAMUSCULAR | Status: DC | PRN
  Administered 2014-10-05: 5 mg via INTRAVENOUS

## 2014-10-05 MED ORDER — ACETAMINOPHEN 325 MG PO TABS: 650.0000 mg | ORAL_TABLET | Freq: Four times a day (QID) | ORAL | Status: DC | PRN

## 2014-10-05 SURGICAL SUPPLY — 4 items
BANDAGE COBAN STERILE 2 (GAUZE/BANDAGES/DRESSINGS) ×2 IMPLANT
BNDG GAUZE ELAST 4 BULKY (GAUZE/BANDAGES/DRESSINGS) ×2 IMPLANT
GAUZE SPONGE 4X4 12PLY STRL (GAUZE/BANDAGES/DRESSINGS) ×2 IMPLANT
GAUZE XEROFORM 1X8 LF (GAUZE/BANDAGES/DRESSINGS) ×2 IMPLANT

## 2014-10-05 NOTE — Progress Notes (Addendum)
[facility] Progress Note   [name] WNI:[id number] DOB: 11-14-1934 DOA: 10/04/2014 PCP: [name], MD  Brief narrative: [name] is a 80 y.o. male with diabetes mellitus, anxiety, mild systolic heart failure, hypertension, mild clonus. He was sharpening tools for his sister on Tues. his left first index finger became red and swollen rapidly by  the evening. He soaked it in Epsom salts and subsequently noted that there was clear yellow drainage coming out of it. He presented to the ER for this issue. X-ray showed possible foreign body. The hand surgeon on-call was consult it who took him to the OR.  Subjective: Some numbness and tingling in the fingers of his left hand. No pain currently. No complaints of nausea vomiting abdominal pain or diarrhea.  Assessment/Plan: Principal Problem:   Cellulitis/septic arthritis of left first DIP? - No leukocytosis and no fever -Operative note reviewed-there was serous fluid encountered during the I& D-the tendon sheath was opened and the tendon was split. Small amount of turbid fluid was encountered suggesting the possibility of a joint infection. This was sent for culture. - no foreign body encountered - -Continue vancomycin and Zosyn  Active Problems:   Hypokalemia - Usually takes 20 mEq of potassium a day-we'll give him 40 today    ARF (acute renal failure) - Resolving-continue slow IV fluids-hold naproxen     Essential hypertension -Continue hydralazine, Norvasc, Lisinopril / HCTZ and carvedilol  Bradycardia - Asymptomatic-BP not low-continue beta blocker    Startle myoclonus    DM type 2 causing renal disease - On Tujeo 12 units at home at bedtime-currently on Lantus 12 units-may need to increase this, sugars currently reasonabe- will follow - Takes mealtime insulin-has been placed on sliding scale here    Generalized anxiety disorder -Continue Klonopin, Depakote and Zoloft  -BPH --continue Flomax   Appt  with PCP: Requested Code Status:     Code Status Orders        Start     Ordered   10/05/14 0510  Full code   Continuous     10/05/14 0510     Family Communication: Sister Disposition Plan: f/u cultures DVT prophylaxis: SCDs Consultants:hand surgery Procedures: I and D left 1st finger  Antibiotics: Anti-infectives    Start     Dose/Rate Route Frequency Ordered Stop   10/05/14 2200  vancomycin (VANCOCIN) 1,250 mg in sodium chloride 0.9 % 250 mL IVPB     1,250 mg 166.7 mL/hr over 90 Minutes Intravenous Every 24 hours 10/05/14 0526     10/05/14 0600  piperacillin-tazobactam (ZOSYN) IVPB 3.375 g     3.375 g 12.5 mL/hr over 240 Minutes Intravenous Every 8 hours 10/05/14 0501     10/05/14 0115  vancomycin (VANCOCIN) IVPB 1000 mg/200 mL premix     1,000 mg 200 mL/hr over 60 Minutes Intravenous  Once 10/05/14 0113 10/05/14 0305   10/04/14 2345  clindamycin (CLEOCIN) capsule 300 mg     300 mg Oral  Once 10/04/14 2341 10/05/14 0031      Objective: Filed Weights   10/05/14 0700  Weight: 110.7 kg (244 lb 0.8 oz)    Intake/Output Summary (Last 24 hours) at 10/05/14 0825 Last data filed at 10/05/14 0700  Gross per 24 hour  Intake 915.67 ml  Output    300 ml  Net 615.67 ml     Vitals Filed Vitals:   10/05/14 0459 10/05/14 0605 10/05/14 0654 10/05/14 0700  BP: 164/68 181/73 158/70   Pulse: 52  50 48   Temp: 97.5 F (36.4 C)  97.3 F (36.3 C)   TempSrc:   Oral   Resp: 16 18 18    Height:    6' (1.829 m)  Weight:    110.7 kg (244 lb 0.8 oz)  SpO2: 97% 97% 98%     Exam:  General:  Pt is alert, not in acute distress  HEENT: No icterus, No thrush, oral mucosa moist  Cardiovascular: regular rate and rhythm, S1/S2 No murmur  Respiratory: clear to auscultation bilaterally   Abdomen: Soft, +Bowel sounds, non tender, non distended, no guarding  MSK: No LE edema, cyanosis or clubbing- left 1st finger in dressing  Data Reviewed: Basic Metabolic Panel:  Recent  Labs Lab 10/05/14 0013 10/05/14 0522  NA 141 139  K 3.6 3.2*  CL 103 104  CO2  --  28  GLUCOSE 164* 141*  BUN 30* 24*  CREATININE 1.30* 1.24  CALCIUM  --  8.8*  MG  --  1.8   Liver Function Tests:  Recent Labs Lab 10/05/14 0522  AST 21  ALT 29  ALKPHOS 48  BILITOT 0.6  PROT 6.4*  ALBUMIN 3.9   No results for input(s): LIPASE, AMYLASE in the last 168 hours. No results for input(s): AMMONIA in the last 168 hours. CBC:  Recent Labs Lab 10/04/14 2359 10/05/14 0013 10/05/14 0522  WBC 9.5  --  6.3  NEUTROABS 5.3  --  3.1  HGB 15.4 16.0 14.1  HCT 46.2 47.0 42.3  MCV 96.7  --  97.0  PLT 259  --  233   Cardiac Enzymes: No results for input(s): CKTOTAL, CKMB, CKMBINDEX, TROPONINI in the last 168 hours. BNP (last 3 results) No results for input(s): BNP in the last 8760 hours.  ProBNP (last 3 results)  Recent Labs  01/27/14 0527  PROBNP 241.8    CBG:  Recent Labs Lab 10/05/14 0719  GLUCAP 143*    Recent Results (from the past 240 hour(s))  Gram stain     Status: None   Collection Time: 10/05/14  3:35 AM  Result Value Ref Range Status   Specimen Description WOUND L INDEX FINGER  Final   Special Requests VANCO,CLEOCIN  Final   Gram Stain   Final    ABUNDANT WBC PRESENT,BOTH PMN AND MONONUCLEAR NO ORGANISMS SEEN Gram Stain Report Called to,Read Back By and Verified WithDanella Penton RN AT [phone] ON 08.25.16 BY SHUEA    Report Status 10/05/2014 FINAL  Final     Studies: Dg Finger Index Left  10/05/2014   CLINICAL DATA:  Finger pain and swelling all day. Small oozing wound from medial finger. Working in his garden this morning. Question foreign body.  EXAM: LEFT INDEX FINGER 2+V  COMPARISON:  None.  FINDINGS: There is a faint 3 mm in density in the distal finger about the radial dorsal aspect subcutaneous tissues that may reflect small foreign body. No fracture or dislocation. No erosion or periosteal reaction. Osteoarthritis with joint space narrowing,  osteophytes, and subchondral cystic change involving the entire digit.  IMPRESSION: Questionable faint 3 mm foreign body in the radial dorsal finger.   Electronically Signed   By: Jeb Levering M.D.   On: 10/05/2014 00:17    Scheduled Meds:  Scheduled Meds: . amLODipine  5 mg Oral q morning - 10a  . carvedilol  12.5 mg Oral BID WC  . clonazePAM  0.5 mg Oral TID  . divalproex  500 mg Oral QHS  .  glimepiride  2 mg Oral QAC breakfast  . hydrALAZINE  50 mg Oral TID  . hydrochlorothiazide  25 mg Oral Daily  . lisinopril  20 mg Oral Daily   And  . hydrochlorothiazide  25 mg Oral Daily  . insulin aspart  0-9 Units Subcutaneous TID WC  . insulin glargine  12 Units Subcutaneous QHS  . piperacillin-tazobactam (ZOSYN)  IV  3.375 g Intravenous Q8H  . potassium chloride SA  40 mEq Oral Daily  . sertraline  25 mg Oral Daily  . tamsulosin  0.4 mg Oral QPC supper  . vancomycin  1,250 mg Intravenous Q24H   Continuous Infusions: . sodium chloride 10 mL/hr at 10/05/14 0526    Time spent on care of this patient: 35 min   [place], MD 10/05/2014, 8:25 AM  LOS: 0 days   [facility] Office  [phone] Pager - Text Page per [email or web address] If 7PM-7AM, please contact night-coverage [email or web address]

## 2014-10-05 NOTE — Transfer of Care (Signed)
Immediate Anesthesia Transfer of Care Note  [name]  Procedure(s) Performed: Procedure(s): IAND D LEFT INDEX FINGER (Left)  Patient Location: PACU  Anesthesia Type:General  Level of Consciousness: awake, oriented, patient cooperative, lethargic and responds to stimulation  Airway & Oxygen Therapy: Patient Spontanous Breathing and Patient connected to face mask oxygen  Post-op Assessment: Report given to RN, Post -op Vital signs reviewed and stable and Patient moving all extremities  Post vital signs: Reviewed and stable  Last Vitals:  Filed Vitals:   10/05/14 0244  BP: 151/76  Pulse: 96  Temp: 36.8 C  Resp: 22    Complications: No apparent anesthesia complications

## 2014-10-05 NOTE — Anesthesia Procedure Notes (Signed)
Procedure Name: LMA Insertion Date/Time: 10/05/2014 3:37 AM Performed by: Ofilia Neas Pre-anesthesia Checklist: Patient identified, Timeout performed, Emergency Drugs available, Suction available and Patient being monitored Patient Re-evaluated:Patient Re-evaluated prior to inductionOxygen Delivery Method: Circle system utilized Preoxygenation: Pre-oxygenation with 100% oxygen Intubation Type: IV induction LMA: LMA with gastric port inserted LMA Size: 5.0 Number of attempts: 1 Placement Confirmation: positive ETCO2 Tube secured with: Tape Dental Injury: Teeth and Oropharynx as per pre-operative assessment

## 2014-10-05 NOTE — Op Note (Signed)
NAMETAJI, BARRETTO NO.:  [id number]  MEDICAL RECORD NO.:  [id number]  LOCATION:  8466                         FACILITY:  [facility]  PHYSICIAN:  Dennie Bible, MD    DATE OF BIRTH:  Jun 01, 1934  DATE OF PROCEDURE:  10/05/2014 DATE OF DISCHARGE:  10/06/2014                              OPERATIVE REPORT   PREOPERATIVE DIAGNOSIS:  Infection of the left index finger.  POSTOPERATIVE DIAGNOSIS:  Infection of the left index finger.  PROCEDURE:  Incision and drainage of left index finger.  Incision and irrigation of the extensor tendon sheath of the left index finger. Irrigation and debridement of the distal interphalangeal joint of the left index finger.  INDICATIONS:  Mr. Ybarra is a 80 year old gentleman, diabetic, who presented to the ER this evening with worsening pain  and swelling and drainage of his left index finger.  He  states he was working outside today and does not recall a specific incident but states that his finger became increasingly more swollen and painful at the DIP joint and eventually started draining fluid dorsally.  He presented to the ER and the concern was for joint infection, extensor tenosynovitis with his diabetic history, urgent action was felt necessary.  Risks, benefits, and alternatives of I and D were discussed with the patient, and he agreed with this course of action.  Consent was obtained.  DESCRIPTION OF PROCEDURE:  The patient was taken to the operating room, placed supine on the operating room table.  General anesthesia was administered without difficulty.  The left upper extremity was prepped and draped in normal sterile fashion overlying the distal interphalangeal joint of the left index finger.  There was a very small puncture wound that was on the radial aspect dorsally that was draining a clear serous type fluid.  A horizontal incision was made in the subcutaneous tissues over this site.  At first there was no turbid  fluid or purulent material that was found.  After the distal insertion of the tendon sheath was opened, there was a small amount of turbid fluid. This was sent for an aerobic and anaerobic cultures.  After further probing, it appeared that some of this fluid was coming from the distal interphalangeal joint.  The extensor tendon was split longitudinally and the joint was accessed.  Again small amount of turbid fluid was encountered.  Incision was opened a little bit more proximally and the extensor tendon sheath again was opened.  There did not appear to be any fluid more proximally and it seemed to be isolated distally.  Afterwards thorough irrigation of the sheath and DIP joint was then performed with saline solution.  A few 5-0 sutures were placed in the skin just to approximate the wound, however, a portion distally was left open for drainage.  Sterile dressing was applied.  Marcaine was instilled proximally for postoperative pain control.  He will be admitted to the hospital due to his age and probable joint infection and comorbidities, placed on IV antibiotics.  Cultures  will be followed for appropriate therapy.    Dennie Bible, MD    HCC/MEDQ  D:  10/05/2014  T:  10/05/2014  Job:  [id number] 

## 2014-10-05 NOTE — Anesthesia Preprocedure Evaluation (Addendum)
Anesthesia Evaluation  Patient identified by MRN, date of birth, ID band Patient awake    Reviewed: Allergy & Precautions, NPO status , Patient's Chart, lab work & pertinent test results  Airway Mallampati: II  TM Distance: >3 FB Neck ROM: Full    Dental no notable dental hx.    Pulmonary neg pulmonary ROS,  breath sounds clear to auscultation  Pulmonary exam normal       Cardiovascular Exercise Tolerance: Good hypertension, Pt. on medications and Pt. on home beta blockers + CAD, + Past MI, + Peripheral Vascular Disease and +CHF Normal cardiovascular examRhythm:Regular Rate:Normal    ECHO 01-27-14 reviewed: EF 45-50%.   Neuro/Psych Anxiety negative neurological ROS     GI/Hepatic negative GI ROS, Neg liver ROS,   Endo/Other  negative endocrine ROSdiabetes  Renal/GU Renal InsufficiencyRenal disease  negative genitourinary   Musculoskeletal negative musculoskeletal ROS (+)   Abdominal (+) + obese,   Peds negative pediatric ROS (+)  Hematology negative hematology ROS (+)   Anesthesia Other Findings   Reproductive/Obstetrics negative OB ROS                            Anesthesia Physical Anesthesia Plan  ASA: III and emergent  Anesthesia Plan: General   Post-op Pain Management:    Induction: Intravenous  Airway Management Planned: Oral ETT  Additional Equipment:   Intra-op Plan:   Post-operative Plan: Extubation in OR  Informed Consent: I have reviewed the patients History and Physical, chart, labs and discussed the procedure including the risks, benefits and alternatives for the proposed anesthesia with the patient or authorized representative who has indicated his/her understanding and acceptance.   Dental advisory given  Plan Discussed with: CRNA  Anesthesia Plan Comments:         Anesthesia Quick Evaluation

## 2014-10-05 NOTE — ED Provider Notes (Signed)
Pt was using a chain saw today to cut up some old bushes and thinks he got something in his LIF. He is having pain, swelling, drainage from his left IF with pain radiating up into his left elbow/antecubital region. Pt is right handed.  Pt has diffuse swelling of his LIF with a small puncture wound on the dorsum of his LIF at the DIPJ radially. The drainage is clear. He has redness in his antecubital area.  He is unable to flex his finger b/o pain, but he does keep a a mild flexion at the DIPJ.   Medical screening examination/treatment/procedure(s) were conducted as a shared visit with non-physician practitioner(s) and myself.  I personally evaluated the patient during the encounter.   EKG Interpretation None       Rolland Porter, MD, Barbette Or, MD 10/05/14 [phone]

## 2014-10-05 NOTE — Care Management Note (Signed)
Case Management Note  Patient Details  Name: [name] MRN: [id number] Date of Birth: 05-29-1934  Subjective/Objective:                  Cellulitis of finger requiring surgical intervention and iv abx  Action/Plan: Date:  October 05, 2014 U.R. performed for needs and level of care. Will continue to follow for Case Management needs.  Velva Harman, RN, BSN, [place]   [phone]  Expected Discharge Date:                  Expected Discharge Plan:  [place]  In-House Referral:  NA  Discharge planning Services  CM Consult  Post Acute Care Choice:    Choice offered to:     DME Arranged:    DME Agency:     HH Arranged:    HH Agency:     Status of Service:  In process, will continue to follow  Medicare Important Message Given:    Date Medicare IM Given:    Medicare IM give by:    Date Additional Medicare IM Given:    Additional Medicare Important Message give by:     If discussed at [place] of Stay Meetings, dates discussed:    Additional Comments:  Leeroy Cha, RN 10/05/2014, 2:51 PM

## 2014-10-05 NOTE — Progress Notes (Signed)
ANTIBIOTIC CONSULT NOTE - INITIAL  Pharmacy Consult for Zosyn and Vancomycin Indication: Wound infection  Allergies  Allergen Reactions  . Codeine Phosphate Nausea Only    Can take with food and usually doesn't cause nausea  . Morphine Nausea Only    Can take with food and usually doesn't cause nausea  . Metformin And Related Diarrhea    Patient Measurements:   Wt=109 kg  Vital Signs: Temp: 97.5 F (36.4 C) (08/25 0459) Temp Source: Oral (08/25 0244) BP: 164/68 mmHg (08/25 0459) Pulse Rate: 52 (08/25 0459) Intake/Output from previous day: 08/24 0701 - 08/25 0700 In: 900 [I.V.:900] Out: -  Intake/Output from this shift: Total I/O In: 900 [I.V.:900] Out: -   Labs:  Recent Labs  10/04/14 2359 10/05/14 0013  WBC 9.5  --   HGB 15.4 16.0  PLT 259  --   CREATININE  --  1.30*   CrCl cannot be calculated (Unknown ideal weight.). No results for input(s): VANCOTROUGH, VANCOPEAK, VANCORANDOM, GENTTROUGH, GENTPEAK, GENTRANDOM, TOBRATROUGH, TOBRAPEAK, TOBRARND, AMIKACINPEAK, AMIKACINTROU, AMIKACIN in the last 72 hours.   Microbiology: Recent Results (from the past 720 hour(s))  Gram stain     Status: None   Collection Time: 10/05/14  3:35 AM  Result Value Ref Range Status   Specimen Description WOUND L INDEX FINGER  Final   Special Requests VANCO,CLEOCIN  Final   Gram Stain   Final    ABUNDANT WBC PRESENT,BOTH PMN AND MONONUCLEAR NO ORGANISMS SEEN Gram Stain Report Called to,Read Back By and Verified WithDanella Penton RN AT [phone] ON 08.25.16 BY SHUEA    Report Status 10/05/2014 FINAL  Final    Medical History: Past Medical History  Diagnosis Date  . CAD (coronary artery disease)   . History of syncope   . HTN (hypertension)   . Borderline hyperlipidemia   . History of gastritis   . Diverticulosis of colon   . Colon polyps   . Hypertrophy of prostate with urinary obstruction and other lower urinary tract symptoms (LUTS)   . History of pyelonephritis   . DJD  (degenerative joint disease)   . Anxiety   . DM (diabetes mellitus)     Adult onset  . 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 History of kidney stones   . Myoclonus     Medications:  Prescriptions prior to admission  Medication Sig Dispense Refill Last Dose  . amLODipine (NORVASC) 10 MG tablet Take 0.5 tablets (5 mg total) by mouth every morning.   09/18/2014 at Unknown time  . aspirin EC 81 MG tablet Take 81 mg by mouth every evening.    09/17/2014 at Unknown time  . carvedilol (COREG) 12.5 MG tablet Take 1 tablet (12.5 mg total) by mouth 2 (two) times daily with a meal. 180 tablet 1 09/18/2014 at 0730  . Cinnamon 500 MG capsule Take 500 mg by mouth 3 (three) times daily.   09/18/2014 at Unknown time  . clonazePAM (KLONOPIN) 0.5 MG tablet Take 1.5 tablets (0.75 mg total) by mouth 3 (three) times daily. (Patient taking differently: Take 0.5 mg by mouth 3 (three) times daily. ) 270 tablet 1 09/18/2014 at Unknown time  . COD LIVER OIL PO Take 1 capsule by mouth 3 (three) times daily.   09/18/2014 at Unknown time  . divalproex (DEPAKOTE) 500 MG DR tablet Take one half tablet (250 mg) by mouth each  morning and 1 tablet (500 mg) by mouth each evening.   09/18/2014 at 0730  . glimepiride (AMARYL) 4 MG tablet Take 0.5 tablets (2 mg total) by mouth daily before breakfast. 45 tablet 1 09/18/2014 at Unknown time  . hydrALAZINE (APRESOLINE) 50 MG tablet Take 1 tablet (50 mg total) by mouth 3 (three) times daily. 270 tablet 1 09/18/2014 at Unknown time  . hydrochlorothiazide (HYDRODIURIL) 25 MG tablet Take 25 mg by mouth daily.   09/18/2014 at Unknown time  . insulin aspart (NOVOLOG) 100 UNIT/ML injection Inject 4-10 Units into the skin 3 (three) times daily before meals. Sliding scale   09/17/2014 at Unknown time  . Insulin Glargine ([name] SOLOSTAR) 300 UNIT/ML SOPN Inject 12 Units into the skin at bedtime. 1 pen 0 09/17/2014 at 2100  .  lisinopril-hydrochlorothiazide (PRINZIDE,ZESTORETIC) 20-25 MG per tablet Take 1 tablet by mouth every morning. 90 tablet 2 09/18/2014 at Unknown time  . naproxen sodium (ANAPROX) 220 MG tablet Take 440 mg by mouth 2 (two) times daily as needed (leg pain).   Past Week at Unknown time  . potassium chloride SA (K-DUR,KLOR-CON) 20 MEQ tablet Take 1 tablet (20 mEq total) by mouth daily. (Patient taking differently: Take 20 mEq by mouth every evening. ) 30 tablet 3 09/17/2014 at Unknown time  . sertraline (ZOLOFT) 25 MG tablet Take 1 tablet daily (Patient not taking: Reported on 09/18/2014) 30 tablet 6   . tamsulosin (FLOMAX) 0.4 MG CAPS capsule Take 1 capsule (0.4 mg total) by mouth daily after supper. 30 capsule 3 09/17/2014 at Unknown time   Scheduled:  . amLODipine  5 mg Oral q morning - 10a  . carvedilol  12.5 mg Oral BID WC  . clonazePAM  0.5 mg Oral TID  . divalproex  500 mg Oral QHS  . glimepiride  2 mg Oral QAC breakfast  . hydrALAZINE  50 mg Oral TID  . hydrochlorothiazide  25 mg Oral Daily  . lisinopril  20 mg Oral Daily   And  . hydrochlorothiazide  25 mg Oral Daily  . insulin aspart  0-9 Units Subcutaneous TID WC  . insulin glargine  12 Units Subcutaneous QHS  . piperacillin-tazobactam (ZOSYN)  IV  3.375 g Intravenous Q8H  . potassium chloride SA  20 mEq Oral QPM  . tamsulosin  0.4 mg Oral QPC supper  . vancomycin  1,250 mg Intravenous Q24H   Infusions:  . sodium chloride 10 mL/hr at 10/05/14 0526   Assessment: 68 yoM admitted with pain and swelling to left index finger.  Vancomycin/Zosyn per Rx for wound infection.   Goal of Therapy:  Vancomycin trough level 15-20 mcg/ml  Plan:   Zosyn 3.375 Gm IV q8h EI  Vancomycin 2Gm x1 ED/OR then 1250mg  IV q24h  F/u SCr/cultures/levels  Lawana Pai R 10/05/2014,5:26 AM

## 2014-10-05 NOTE — Consult Note (Signed)
Reason for Consult:Left IF finger infection Referring Physician: ER  CC:I got something in my finger and its throbbing  HPI:  [name] is an 80 y.o. right handed male who presents with  Pain and swelling to LIF ; pt working in yard, garden today noticed painful swollen draining LIF, pain radiating up hand      .   Pain is rated at  7  /10 and is described as sharp/dull.  Pain is constant.  Pain is made better by rest/immobilization, worse with motion.   Associated signs/symptoms:pain radiating up hand Previous treatment:    Past Medical History  Diagnosis Date  . CAD (coronary artery disease)   . History of syncope   . HTN (hypertension)   . Borderline hyperlipidemia   . History of gastritis   . Diverticulosis of colon   . Colon polyps   . Hypertrophy of prostate with urinary obstruction and other lower urinary tract symptoms (LUTS)   . History of pyelonephritis   . DJD (degenerative joint disease)   . Anxiety   . DM (diabetes mellitus)     Adult onset  . 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 History of kidney stones   . Myoclonus     Past Surgical History  Procedure Laterality Date  . Appendectomy    . Cataract extraction      RIGHT EYE  . Knee surgery      bilateral ARTHROSCOPY X2 TO EACH KNEE  . Shoulder surgery  04/2005    left by Dr. Berenice Primas  . 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 S/p cysto & stents for kidnedy stone 11/08 by dr. Reece Agar    . Cystoscopy  04/17/11    stent placed  . Cystoscopy w/ ureteral stent placement  04/17/2011    Procedure: CYSTOSCOPY WITH RETROGRADE PYELOGRAM/URETERAL STENT PLACEMENT;  Surgeon: Hanley Ben, MD;  Location: [facility];  Service: Urology;  Laterality: Left;  . Cystoscopy with retrograde pyelogram, ureteroscopy and stent placement Left 04/18/2013    Procedure: CYSTOSCOPY WITH LEFT RETROGRADE  PYELOGRAM, LEFT URETEROSCOPY AND LASER LITHOTRIPSY LEFT STENT PLACEMENT;  Surgeon: Dutch Gray, MD;  Location: [facility];  Service: Urology;  Laterality: Left;  . Holmium laser application Left 03/13/3084    Procedure: HOLMIUM LASER APPLICATION;  Surgeon: Dutch Gray, MD;  Location: [facility];  Service: Urology;  Laterality: Left;  . Cholecystectomy      Family History  Problem Relation Age of Onset  . Heart attack Mother   . Dementia Brother   . Colon cancer Neg Hx   . Prostate cancer Neg Hx     Social History:  reports that he has never smoked. He has never used smokeless tobacco. He reports that he does not drink alcohol or use illicit drugs.  Allergies:  Allergies  Allergen Reactions  . Codeine Phosphate Nausea Only    Can take with food and usually doesn't cause nausea  . Morphine Nausea Only    Can take with food and usually doesn't cause nausea  . Metformin And Related Diarrhea    Medications: I have reviewed the patient's current medications.  Results for orders placed or performed during the hospital encounter of 10/04/14 (from the past 48 hour(s))  CBC with Differential     Status: Abnormal   Collection Time: 10/04/14 11:59  PM  Result Value Ref Range   WBC 9.5 4.0 - 10.5 K/uL   RBC 4.78 4.22 - 5.81 MIL/uL   Hemoglobin 15.4 13.0 - 17.0 g/dL   HCT 46.2 39.0 - 52.0 %   MCV 96.7 78.0 - 100.0 fL   MCH 32.2 26.0 - 34.0 pg   MCHC 33.3 30.0 - 36.0 g/dL   RDW 13.9 11.5 - 15.5 %   Platelets 259 150 - 400 K/uL   Neutrophils Relative % 56 43 - 77 %   Neutro Abs 5.3 1.7 - 7.7 K/uL   Lymphocytes Relative 27 12 - 46 %   Lymphs Abs 2.6 0.7 - 4.0 K/uL   Monocytes Relative 14 (H) 3 - 12 %   Monocytes Absolute 1.4 (H) 0.1 - 1.0 K/uL   Eosinophils Relative 3 0 - 5 %   Eosinophils Absolute 0.3 0.0 - 0.7 K/uL   Basophils Relative 0 0 - 1 %   Basophils Absolute 0.0 0.0 - 0.1 K/uL  I-stat chem 8, ed     Status: Abnormal   Collection Time: 10/05/14 12:13 AM  Result Value Ref Range    Sodium 141 135 - 145 mmol/L   Potassium 3.6 3.5 - 5.1 mmol/L   Chloride 103 101 - 111 mmol/L   BUN 30 (H) 6 - 20 mg/dL   Creatinine, Ser 1.30 (H) 0.61 - 1.24 mg/dL   Glucose, Bld 164 (H) 65 - 99 mg/dL   Calcium, Ion 1.12 (L) 1.13 - 1.30 mmol/L   TCO2 23 0 - 100 mmol/L   Hemoglobin 16.0 13.0 - 17.0 g/dL   HCT 47.0 39.0 - 52.0 %    Dg Finger Index Left  10/05/2014   CLINICAL DATA:  Finger pain and swelling all day. Small oozing wound from medial finger. Working in his garden this morning. Question foreign body.  EXAM: LEFT INDEX FINGER 2+V  COMPARISON:  None.  FINDINGS: There is a faint 3 mm in density in the distal finger about the radial dorsal aspect subcutaneous tissues that may reflect small foreign body. No fracture or dislocation. No erosion or periosteal reaction. Osteoarthritis with joint space narrowing, osteophytes, and subchondral cystic change involving the entire digit.  IMPRESSION: Questionable faint 3 mm foreign body in the radial dorsal finger.   Electronically Signed   By: Jeb Levering M.D.   On: 10/05/2014 00:17    Pertinent items are noted in HPI. Temp:  [98.3 F (36.8 C)] 98.3 F (36.8 C) (08/25 0244) Pulse Rate:  [51-96] 96 (08/25 0244) Resp:  [20-22] 22 (08/25 0244) BP: (151-177)/(74-76) 151/76 mmHg (08/25 0244) SpO2:  [94 %-97 %] 97 % (08/25 0244) General appearance: alert and cooperative Resp: clear to auscultation bilaterally Cardio: regular rate and rhythm GI: soft, non-tender; bowel sounds normal; no masses,  no organomegaly Extremities: extremities normal, atraumatic, no cyanosis or edema  Except for swollen LIF, with puncture wound dorsally over dipj with draining fluid, flexor tendon sheath non tender, surrounding erythema   Assessment: Infection LIF, fB, ? dipj infection, early ext tenosynovitis Plan: Needs urgent I&D, then admission for abx  I have discussed this treatment plan in detail with patient , including the risks of the recommended  treatment and surgery, the benefits and the alternatives.  The patient understands that additional treatment may be necessary.  [name] 10/05/2014, 2:58 AM

## 2014-10-05 NOTE — Progress Notes (Signed)
S:  Finger hurts a little  O:Blood pressure 145/70, pulse 55, temperature 97.3 F (36.3 C), temperature source Oral, resp. rate 18, height 6' (1.829 m), weight 110.7 kg (244 lb 0.8 oz), SpO2 95 %.  Dressing removed, some swelling, no drainage, n/v intact G-Stain - no organisms, +wbc's A: s/p I&D DIPJ infection   P:cont abx, f/u cultures, ? D/c home tomorrow

## 2014-10-05 NOTE — H&P (Signed)
[facility] History and Physical  ALEXANDR YAWORSKI EEF:[id number] DOB: 1935-01-09 DOA: 10/04/2014  Referring physician: Ms.Gail. PCP: [name], MD  Specialists: None.  Chief Complaint: Pain and swelling of the left hand index finger.  HPI: [name] is a 80 y.o. male with history of diabetes mellitus type 2, CHF last EF measured in 2015 was 45-50%, hypertension, myoclonus presents to the ER because of pain and swelling of his left index finger. Patient's symptoms started yesterday morning. Denies any trauma. Patient is working in his yard. The pain was moving proximally to his left arm. Denies any fever or chills. On exam patient is unable to flex his left index finger and is also mildly erythematous and swollen. X-ray shows possible foreign body. Dr. Lenon Curt on call hand surgeon has been consulted and patient will be taken for surgery and admitted for further management. Patient denies any chest pain or shortness of breath.   Review of Systems: As presented in the history of presenting illness, rest negative.  Past Medical History  Diagnosis Date  . CAD (coronary artery disease)   . History of syncope   . HTN (hypertension)   . Borderline hyperlipidemia   . History of gastritis   . Diverticulosis of colon   . Colon polyps   . Hypertrophy of prostate with urinary obstruction and other lower urinary tract symptoms (LUTS)   . History of pyelonephritis   . DJD (degenerative joint disease)   . Anxiety   . DM (diabetes mellitus)     Adult onset  . 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 History of kidney stones   . Myoclonus    Past Surgical History  Procedure Laterality Date  . Appendectomy    . Cataract extraction      RIGHT EYE  . Knee surgery      bilateral ARTHROSCOPY X2 TO EACH KNEE  . Shoulder surgery  04/2005    left by Dr. Berenice Primas  . S/p elap 1986 w/excision of leiomyoma @ ge junction,  meckle's divertic resected, & cholecystectomy    . S/p cysto & stents for kidnedy stone 11/08 by dr. Reece Agar    . Cystoscopy  04/17/11    stent placed  . Cystoscopy w/ ureteral stent placement  04/17/2011    Procedure: CYSTOSCOPY WITH RETROGRADE PYELOGRAM/URETERAL STENT PLACEMENT;  Surgeon: Hanley Ben, MD;  Location: [facility];  Service: Urology;  Laterality: Left;  . Cystoscopy with retrograde pyelogram, ureteroscopy and stent placement Left 04/18/2013    Procedure: CYSTOSCOPY WITH LEFT RETROGRADE PYELOGRAM, LEFT URETEROSCOPY AND LASER LITHOTRIPSY LEFT STENT PLACEMENT;  Surgeon: Dutch Gray, MD;  Location: [facility];  Service: Urology;  Laterality: Left;  . Holmium laser application Left 09/17/3252    Procedure: HOLMIUM LASER APPLICATION;  Surgeon: Dutch Gray, MD;  Location: [facility];  Service: Urology;  Laterality: Left;  . Cholecystectomy     Social History:  reports that he has never smoked. He has never used smokeless tobacco. He reports that he does not drink alcohol or use illicit drugs. Where does patient live home. Can patient participate in ADLs? Yes.  Allergies  Allergen Reactions  . Codeine Phosphate Nausea Only    Can take with food and usually doesn't cause nausea  . Morphine Nausea Only    Can take with food and usually doesn't cause nausea  . Metformin And Related Diarrhea  Family History:  Family History  Problem Relation Age of Onset  . Heart attack Mother   . Dementia Brother   . Colon cancer Neg Hx   . Prostate cancer Neg Hx       Prior to Admission medications   Medication Sig Start Date End Date Taking? Authorizing Provider  amLODipine (NORVASC) 10 MG tablet Take 0.5 tablets (5 mg total) by mouth every morning. 08/17/14   Tonia Ghent, MD  aspirin EC 81 MG tablet Take 81 mg by mouth every evening.     Historical Provider, MD  carvedilol (COREG) 12.5 MG tablet Take 1 tablet (12.5 mg total) by mouth 2 (two) times daily with a meal. 05/09/14   Tonia Ghent, MD   Cinnamon 500 MG capsule Take 500 mg by mouth 3 (three) times daily.    Historical Provider, MD  clonazePAM (KLONOPIN) 0.5 MG tablet Take 1.5 tablets (0.75 mg total) by mouth 3 (three) times daily. Patient taking differently: Take 0.5 mg by mouth 3 (three) times daily.  05/23/14   Tonia Ghent, MD  COD LIVER OIL PO Take 1 capsule by mouth 3 (three) times daily.    Historical Provider, MD  divalproex (DEPAKOTE) 500 MG DR tablet Take one half tablet (250 mg) by mouth each morning and 1 tablet (500 mg) by mouth each evening. 09/08/14   Tonia Ghent, MD  glimepiride (AMARYL) 4 MG tablet Take 0.5 tablets (2 mg total) by mouth daily before breakfast. 06/02/14   Tonia Ghent, MD  hydrALAZINE (APRESOLINE) 50 MG tablet Take 1 tablet (50 mg total) by mouth 3 (three) times daily. 05/23/14   Tonia Ghent, MD  hydrochlorothiazide (HYDRODIURIL) 25 MG tablet Take 25 mg by mouth daily.    Historical Provider, MD  insulin aspart (NOVOLOG) 100 UNIT/ML injection Inject 4-10 Units into the skin 3 (three) times daily before meals. Sliding scale    Historical Provider, MD  Insulin Glargine ([name] SOLOSTAR) 300 UNIT/ML SOPN Inject 12 Units into the skin at bedtime. 06/23/14   Tonia Ghent, MD  lisinopril-hydrochlorothiazide [facility]) 20-25 MG per tablet Take 1 tablet by mouth every morning. 08/03/14   Tonia Ghent, MD  naproxen sodium (ANAPROX) 220 MG tablet Take 440 mg by mouth 2 (two) times daily as needed (leg pain).    Historical Provider, MD  potassium chloride SA (K-DUR,KLOR-CON) 20 MEQ tablet Take 1 tablet (20 mEq total) by mouth daily. Patient taking differently: Take 20 mEq by mouth every evening.  05/12/14   Tonia Ghent, MD  sertraline (ZOLOFT) 25 MG tablet Take 1 tablet daily Patient not taking: Reported on 09/18/2014 09/18/14   Cameron Sprang, MD  tamsulosin [facility]) 0.4 MG CAPS capsule Take 1 capsule (0.4 mg total) by mouth daily after supper. 05/12/14   Tonia Ghent, MD    Physical  Exam: Filed Vitals:   10/05/14 0244 10/05/14 0402 10/05/14 0411 10/05/14 0415  BP: 151/76 149/72  125/63  Pulse: 96 60  59  Temp: 98.3 F (36.8 C) 97.7 F (36.5 C)    TempSrc: Oral     Resp: 22 18  20   SpO2: 97% 100% 100% 95%     General:  Moderately built and nourished.  Eyes: Anicteric no pallor.  ENT: No discharge from the ears eyes nose and mouth.  Neck: No mass felt. No JVD appreciated.  Cardiovascular: S1 and S2 heard.  Respiratory: No rhonchi or crepitations.  Abdomen: Soft nontender bowel sounds present.  Skin: Left hand index finger appears mildly erythematous.  Musculoskeletal: Left hand index finger appears swollen unable to flex and also has erythema.  Psychiatric: Appears normal.  Neurologic: Alert awake oriented to time place and person. Moves all extremities.  Labs on Admission:  Basic Metabolic Panel:  Recent Labs Lab 10/05/14 0013  NA 141  K 3.6  CL 103  GLUCOSE 164*  BUN 30*  CREATININE 1.30*   Liver Function Tests: No results for input(s): AST, ALT, ALKPHOS, BILITOT, PROT, ALBUMIN in the last 168 hours. No results for input(s): LIPASE, AMYLASE in the last 168 hours. No results for input(s): AMMONIA in the last 168 hours. CBC:  Recent Labs Lab 10/04/14 2359 10/05/14 0013  WBC 9.5  --   NEUTROABS 5.3  --   HGB 15.4 16.0  HCT 46.2 47.0  MCV 96.7  --   PLT 259  --    Cardiac Enzymes: No results for input(s): CKTOTAL, CKMB, CKMBINDEX, TROPONINI in the last 168 hours.  BNP (last 3 results) No results for input(s): BNP in the last 8760 hours.  ProBNP (last 3 results)  Recent Labs  01/27/14 0527  PROBNP 241.8    CBG: No results for input(s): GLUCAP in the last 168 hours.  Radiological Exams on Admission: Dg Finger Index Left  10/05/2014   CLINICAL DATA:  Finger pain and swelling all day. Small oozing wound from medial finger. Working in his garden this morning. Question foreign body.  EXAM: LEFT INDEX FINGER 2+V   COMPARISON:  None.  FINDINGS: There is a faint 3 mm in density in the distal finger about the radial dorsal aspect subcutaneous tissues that may reflect small foreign body. No fracture or dislocation. No erosion or periosteal reaction. Osteoarthritis with joint space narrowing, osteophytes, and subchondral cystic change involving the entire digit.  IMPRESSION: Questionable faint 3 mm foreign body in the radial dorsal finger.   Electronically Signed   By: Jeb Levering M.D.   On: 10/05/2014 00:17    EKG: Independently reviewed. Sinus bradycardia.  Assessment/Plan Principal Problem:   Cellulitis of left hand Active Problems:   Essential hypertension   Startle myoclonus   DM type 2 causing renal disease   Bradycardia   ARF (acute renal failure)   1. Cellulitis of the left hand - I have discussed with on-call and surgeon Dr. Lenon Curt. At this time patient is status post debridement. Patient will be placed on vancomycin and Zosyn. Follow cultures. Further recommendations per hand surgeon. 2. Sinus bradycardia - I have discussed with on-call cardiologist Dr. Colon Flattery concerning patient's rhythm. Dr. Gloriann Loan for this time is advised to closely follow electrolytes since it shows U waves.. Since Coreg will be having holding parameters. Check TSH. Monitor in telemetry. 3. Acute renal failure on chronic kidney disease - patient has received gentle hydration in the ER. At this time will closely follow metabolic panel. If there is any further worsening of creatinine then may have to hold HCTZ and lisinopril. 4. History of CHF with last EF measured 45-50% with grade 1 diastolic dysfunction in 7858 - appears compensated. Patient is on lisinopril and HCTZ. Caution about hydration. 5. Diabetes mellitus type 2 - appears to be controlled. Closely follow CBGs. Continue home medications. I have placed patient on sliding scale coverage. 6. Hypertension - see #4 and 3. 7. Startle myoclonus - continue Depakote. Check  Depakote levels.  I have reviewed patient's old charts and labs and personally reviewed x-ray. I have discussed with on-call hand surgeon  and cardiologist.   DVT Prophylaxis SCDs for now. And if there is no further procedures planned in the morning then changed to Lovenox.  Code Status: Full code as per the patient.  Family Communication: Discussed with patient.  Disposition Plan: Admit to inpatient.    KAKRAKANDY,ARSHAD N. [facility] Pager [phone].  If 7PM-7AM, please contact night-coverage [email or web address] Password [facility] 10/05/2014, 4:29 AM

## 2014-10-05 NOTE — Anesthesia Postprocedure Evaluation (Signed)
  Anesthesia Post-op Note  [name]  Procedure(s) Performed: Procedure(s) (LRB): IAND D LEFT INDEX FINGER (Left)  Patient Location: PACU  Anesthesia Type: General  Level of Consciousness: awake and alert   Airway and Oxygen Therapy: Patient Spontanous Breathing  Post-op Pain: mild  Post-op Assessment: Post-op Vital signs reviewed, Patient's Cardiovascular Status Stable, Respiratory Function Stable, Patent Airway and No signs of Nausea or vomiting  Last Vitals:  Filed Vitals:   10/05/14 0654  BP: 158/70  Pulse: 48  Temp: 36.3 C  Resp: 18    Post-op Vital Signs: stable   Complications: No apparent anesthesia complications

## 2014-10-06 ENCOUNTER — Other Ambulatory Visit: Payer: Self-pay | Admitting: Family Medicine

## 2014-10-06 ENCOUNTER — Encounter (HOSPITAL_COMMUNITY): Payer: Self-pay | Admitting: General Surgery

## 2014-10-06 DIAGNOSIS — M00842 Arthritis due to other bacteria, left hand: Secondary | ICD-10-CM

## 2014-10-06 DIAGNOSIS — R946 Abnormal results of thyroid function studies: Secondary | ICD-10-CM

## 2014-10-06 DIAGNOSIS — R7989 Other specified abnormal findings of blood chemistry: Secondary | ICD-10-CM

## 2014-10-06 LAB — GLUCOSE, CAPILLARY
Glucose-Capillary: 126 mg/dL — ABNORMAL HIGH (ref 65–99)
Glucose-Capillary: 112 mg/dL — ABNORMAL HIGH (ref 65–99)

## 2014-10-06 LAB — BASIC METABOLIC PANEL
Anion gap: 7 (ref 5–15)
BUN: 20 mg/dL (ref 6–20)
CHLORIDE: 105 mmol/L (ref 101–111)
CO2: 27 mmol/L (ref 22–32)
CREATININE: 1.16 mg/dL (ref 0.61–1.24)
Calcium: 8.8 mg/dL — ABNORMAL LOW (ref 8.9–10.3)
GFR, EST NON AFRICAN AMERICAN: 59 mL/min — AB (ref >60)
Glucose, Bld: 133 mg/dL — ABNORMAL HIGH (ref 65–99)
Potassium: 3.7 mmol/L (ref 3.5–5.1)
SODIUM: 139 mmol/L (ref 135–145)

## 2014-10-06 LAB — T4, FREE: FREE T4: 1.25 ng/dL — AB (ref 0.61–1.12)

## 2014-10-06 LAB — TSH: TSH: 1.933 u[IU]/mL (ref 0.350–4.500)

## 2014-10-06 MED ORDER — TRAMADOL HCL 50 MG PO TABS
50.0000 mg | ORAL_TABLET | Freq: Once | ORAL | Status: AC
  Administered 2014-10-06: 50 mg via ORAL
  Filled 2014-10-06: qty 1

## 2014-10-06 MED ORDER — CLONAZEPAM 0.5 MG PO TABS: 0.5000 mg | ORAL_TABLET | Freq: Three times a day (TID) | ORAL | Status: DC

## 2014-10-06 MED ORDER — DOXYCYCLINE HYCLATE 100 MG PO TABS: 100.0000 mg | ORAL_TABLET | Freq: Two times a day (BID) | ORAL | Status: DC

## 2014-10-06 MED ORDER — DOXYCYCLINE HYCLATE 100 MG PO CAPS: 100.0000 mg | ORAL_CAPSULE | Freq: Two times a day (BID) | ORAL | Status: DC

## 2014-10-06 MED ORDER — TRAMADOL HCL 50 MG PO TABS: 50.0000 mg | ORAL_TABLET | Freq: Four times a day (QID) | ORAL | Status: DC | PRN

## 2014-10-06 MED ORDER — GLIMEPIRIDE 4 MG PO TABS: ORAL_TABLET | ORAL | Status: DC

## 2014-10-06 NOTE — Discharge Summary (Signed)
Physician Discharge Summary  [name] MEQ:[id number] DOB: 1934-12-02 DOA: 10/04/2014  PCP: [name], MD  Admit date: 10/04/2014 Discharge date: 10/06/2014  Time spent: 55 minutes  Recommendations for Outpatient Follow-up:  1. Needs Bmet in 1 wk to check renal function and K+ 2. F/u on Thyroid studies and culture results   Discharge Condition: stable Consultants: hand surgery- Dr Dayna Barker Procedures: I and D left 1st finger  Discharge Diagnoses:  Principal Problem:   Cellulitis of left 1st finger/ septic arthritis of DIP Active Problems:   Hypokalemia   ARF (acute renal failure)   Elevated TSH   Startle myoclonus   Essential hypertension   DM type 2 causing renal disease   Bradycardia   Generalized anxiety disorder   History of present illness:  [name] is a 80 y.o. male with diabetes mellitus, anxiety, mild systolic heart failure, hypertension, and startle myoclonus. He was sharpening tools for his sister on Tues. his left first index finger became red and swollen rapidly by the evening. He soaked it in Epsom salts and subsequently noted that there was clear yellow drainage coming out of it. He presented to the ER for this issue. X-ray showed possible foreign body. The hand surgeon on-call was consulted Jory Ee) who took him to the OR early AM on 8/25.  Hospital Course:  Principal Problem:  Cellulitis/septic arthritis of left first DIP? - No leukocytosis and no fever -Operative note reviewed-there was serous fluid encountered during the I& D-the tendon sheath was opened and the tendon was split. Small amount of turbid fluid was encountered suggesting the possibility of a joint infection. This was sent for culture. - no foreign body encountered - gr stain reveals abundant PMN and mononuclear cells but no organisms  - Has been receiving vancomycin and Zosyn- will d/c home with Doxycycline and I will f/u on his culture results - Dr Lenon Curt recommends 10  more days of antibiotics for joint infection- will prescribe Doxycycline - he will see his PCP on 9/1 where the wound can be assessed; he will see Dr Lenon Curt on 9/7.  Active Problems:  Hypokalemia - Usually takes 20 mEq of potassium a day- given 40 yesterday to replace K- K normalized today   ARF (acute renal failure) - Resolved- given IV fluids-held naproxen - will cont to hold NSAIDs- communicated with patient- check Bmet next week   Essential hypertension -Continue hydralazine, Norvasc, Lisinopril / HCTZ and carvedilol  Bradycardia - Asymptomatic-BP not low-continue beta blocker  Elevated TSH?? - TSH ordered due to bradycardia - TSH was 15 yesterday- I repeated it and today is 1.9 (normal) - F T4- 1.25 - F T3 pending- PCP to follow    DM type 2 causing renal disease - On Tujeo 12 units at home at bedtime and mealtime insulin which can be resumed- sugars have been stable here   Generalized anxiety disorder -Continue Klonopin, Depakote and Zoloft (recently started)  -BPH --continue Flomax    Discharge Exam: Filed Weights   10/05/14 0700  Weight: 110.7 kg (244 lb 0.8 oz)   Filed Vitals:   10/06/14 1436  BP: 148/63  Pulse: 54  Temp: 98 F (36.7 C)  Resp: 18    General: AAO x 3, no distress Cardiovascular: RRR, no murmurs  Respiratory: clear to auscultation bilaterally GI: soft, non-tender, non-distended, bowel sound positive  Discharge Instructions You were cared for by a hospitalist during your hospital stay. If you have any questions about your discharge medications or the  care you received while you were in the hospital after you are discharged, you can call the unit and asked to speak with the hospitalist on call if the hospitalist that took care of you is not available. Once you are discharged, your primary care physician will handle any further medical issues. Please note that NO REFILLS for any discharge medications will be authorized once you are  discharged, as it is imperative that you return to your primary care physician (or establish a relationship with a primary care physician if you do not have one) for your aftercare needs so that they can reassess your need for medications and monitor your lab values.     Medication List    STOP taking these medications        naproxen sodium 220 MG tablet  Commonly known as:  ANAPROX      TAKE these medications        amLODipine 10 MG tablet  Commonly known as:  NORVASC  Take 0.5 tablets (5 mg total) by mouth every morning.     aspirin EC 81 MG tablet  Take 81 mg by mouth every evening.     carvedilol 12.5 MG tablet  Commonly known as:  COREG  Take 1 tablet (12.5 mg total) by mouth 2 (two) times daily with a meal.     Cinnamon 500 MG capsule  Take 500 mg by mouth 3 (three) times daily.     clonazePAM 0.5 MG tablet  Commonly known as:  KLONOPIN  Take 1 tablet (0.5 mg total) by mouth 3 (three) times daily.     COD LIVER OIL PO  Take 1 capsule by mouth 3 (three) times daily.     divalproex 500 MG DR tablet  Commonly known as:  DEPAKOTE  Take one half tablet (250 mg) by mouth each morning and 1 tablet (500 mg) by mouth each evening.     doxycycline 100 MG capsule  Commonly known as:  VIBRAMYCIN  Take 1 capsule (100 mg total) by mouth 2 (two) times daily.     glimepiride 4 MG tablet  Commonly known as:  AMARYL  Take one-half tablet by  mouth before breakfast     hydrALAZINE 50 MG tablet  Commonly known as:  APRESOLINE  Take 1 tablet (50 mg total) by mouth 3 (three) times daily.     hydrochlorothiazide 25 MG tablet  Commonly known as:  HYDRODIURIL  Take 25 mg by mouth daily.     insulin aspart 100 UNIT/ML injection  Commonly known as:  novoLOG  Inject 4-10 Units into the skin 3 (three) times daily before meals. Sliding scale     Insulin Glargine 300 UNIT/ML Sopn  Commonly known as:  [name] SOLOSTAR  Inject 12 Units into the skin at bedtime.      lisinopril-hydrochlorothiazide 20-25 MG per tablet  Commonly known as:  PRINZIDE,ZESTORETIC  Take 1 tablet by mouth every morning.     potassium chloride SA 20 MEQ tablet  Commonly known as:  K-DUR,KLOR-CON  Take 1 tablet (20 mEq total) by mouth daily.     sertraline 25 MG tablet  Commonly known as:  ZOLOFT  Take 1 tablet daily     tamsulosin 0.4 MG Caps capsule  Commonly known as:  FLOMAX  Take 1 capsule (0.4 mg total) by mouth daily after supper.     traMADol 50 MG tablet  Commonly known as:  ULTRAM  Take 1-2 tablets (50-100 mg total) by mouth every 6 (  six) hours as needed for moderate pain.       Allergies  Allergen Reactions  . Codeine Phosphate Nausea Only    Can take with food and usually doesn't cause nausea  . Morphine Nausea Only    Can take with food and usually doesn't cause nausea  . Metformin And Related Diarrhea   Follow-up Information    Follow up with Rayvon Char, MD On 10/18/2014.   Specialty:  General Surgery   Why:  Please follow up with Dr. Lenon Curt on Wednesday, September 7th at 9:15am   Contact information:   [address] [place]. [place] [address] [phone]       Follow up with [name], MD On 10/12/2014.   Specialty:  Family Medicine   Why:  Please follow up with Dr. Damita Dunnings on Thursday, September 1st at 12:15pm   Contact information:   [place] [address] [phone]        The results of significant diagnostics from this hospitalization (including imaging, microbiology, ancillary and laboratory) are listed below for reference.    Significant Diagnostic Studies: Mr Lumbar Spine Wo Contrast  09/27/2014   CLINICAL DATA:  Low back pain. Difficulty walking for 10 months. Tremors. Lower extremity weakness. Gait instability.  EXAM: MRI LUMBAR SPINE WITHOUT CONTRAST  TECHNIQUE: Multiplanar, multisequence MR imaging of the lumbar spine was performed. No intravenous contrast was administered.   COMPARISON:  01/30/2014 CT scan  FINDINGS: A transitional lumbosacral vertebra is assumed to represent the S1 level. Careful correlation with this numbering strategy prior to any procedural intervention would be recommended. The conus medullaris appears normal. Conus level: L1-2.  Intervertebral disc desiccation is observed at all levels between L2 and S1. Loss of disc height at L4-5 and L5-S1 with associated degenerative endplate findings.  Bilateral renal fluid signal intensity lesions favor cysts.  No vertebral subluxation is observed. Additional findings at individual levels are as follows:  L1-2:  Unremarkable.  L2-3:  No impingement.  Mild disc bulge.  L3-4: Moderate left eccentric central narrowing of the thecal sac with mild bilateral foraminal stenosis, mild left subarticular lateral recess stenosis, and mild displacement of the left L3 nerve in the lateral extraforaminal space due to disc bulge, left paracentral on lateral recess disc protrusion, facet arthropathy, and intervertebral spurring.  L4-5: Mild central narrowing of the thecal sac with mild right subarticular lateral recess stenosis, mild bilateral foraminal stenosis, and mild displacement of the right L4 nerve in the lateral extraforaminal space due to intervertebral spurring, disc bulge, and facet arthropathy.  L5-S1: Mild to moderate left and mild right foraminal stenosis with mild bilateral subarticular lateral recess stenosis due to intervertebral spurring, disc bulge, and facet arthropathy. Small central annular tear.  IMPRESSION: 1. A transitional lumbosacral vertebra is assumed to represent the S1 level. Careful correlation with this numbering strategy prior to any procedural intervention would be recommended. 2. Lumbar spondylosis and degenerative disc disease, causing moderate impingement at L3-4 ; mild to moderate impingement at L5-S1; mild impingement at L4-5, as detailed above.   Electronically Signed   By: Van Clines M.D.    On: 09/27/2014 11:21   Dg Finger Index Left  10/05/2014   CLINICAL DATA:  Finger pain and swelling all day. Small oozing wound from medial finger. Working in his garden this morning. Question foreign body.  EXAM: LEFT INDEX FINGER 2+V  COMPARISON:  None.  FINDINGS: There is a faint 3 mm in density in the distal  finger about the radial dorsal aspect subcutaneous tissues that may reflect small foreign body. No fracture or dislocation. No erosion or periosteal reaction. Osteoarthritis with joint space narrowing, osteophytes, and subchondral cystic change involving the entire digit.  IMPRESSION: Questionable faint 3 mm foreign body in the radial dorsal finger.   Electronically Signed   By: Jeb Levering M.D.   On: 10/05/2014 00:17    Microbiology: Recent Results (from the past 240 hour(s))  Gram stain     Status: None   Collection Time: 10/05/14  3:35 AM  Result Value Ref Range Status   Specimen Description WOUND L INDEX FINGER  Final   Special Requests VANCO,CLEOCIN  Final   Gram Stain   Final    ABUNDANT WBC PRESENT,BOTH PMN AND MONONUCLEAR NO ORGANISMS SEEN Gram Stain Report Called to,Read Back By and Verified With: SSuan Halter RN AT [phone] ON 08.25.16 BY SHUEA    Report Status 10/05/2014 FINAL  Final  Wound culture     Status: None (Preliminary result)   Collection Time: 10/05/14  3:35 AM  Result Value Ref Range Status   Specimen Description WOUND L INDEX FINGER  Final   Special Requests VANCO,CLEOCIN  Final   Gram Stain   Final    FEW WBC PRESENT,BOTH PMN AND MONONUCLEAR NO SQUAMOUS EPITHELIAL CELLS SEEN NO ORGANISMS SEEN Performed at [facility]    Culture   Final    NO GROWTH 1 DAY Performed at [facility]    Report Status PENDING  Incomplete  Anaerobic culture     Status: None (Preliminary result)   Collection Time: 10/05/14  3:35 AM  Result Value Ref Range Status   Specimen Description WOUND L INDEX FINGER  Final   Special Requests VANCO,CLEOCIN  Final    Gram Stain   Final    FEW WBC PRESENT,BOTH PMN AND MONONUCLEAR NO SQUAMOUS EPITHELIAL CELLS SEEN NO ORGANISMS SEEN Performed at [facility]    Culture   Final    NO ANAEROBES ISOLATED; CULTURE IN PROGRESS FOR 5 DAYS Performed at [facility]    Report Status PENDING  Incomplete     Labs: Basic Metabolic Panel:  Recent Labs Lab 10/05/14 0013 10/05/14 0522 10/06/14 0540  NA 141 139 139  K 3.6 3.2* 3.7  CL 103 104 105  CO2  --  28 27  GLUCOSE 164* 141* 133*  BUN 30* 24* 20  CREATININE 1.30* 1.24 1.16  CALCIUM  --  8.8* 8.8*  MG  --  1.8  --    Liver Function Tests:  Recent Labs Lab 10/05/14 0522  AST 21  ALT 29  ALKPHOS 48  BILITOT 0.6  PROT 6.4*  ALBUMIN 3.9   No results for input(s): LIPASE, AMYLASE in the last 168 hours. No results for input(s): AMMONIA in the last 168 hours. CBC:  Recent Labs Lab 10/04/14 2359 10/05/14 0013 10/05/14 0522  WBC 9.5  --  6.3  NEUTROABS 5.3  --  3.1  HGB 15.4 16.0 14.1  HCT 46.2 47.0 42.3  MCV 96.7  --  97.0  PLT 259  --  233   Cardiac Enzymes: No results for input(s): CKTOTAL, CKMB, CKMBINDEX, TROPONINI in the last 168 hours. BNP: BNP (last 3 results) No results for input(s): BNP in the last 8760 hours.  ProBNP (last 3 results)  Recent Labs  01/27/14 0527  PROBNP 241.8    CBG:  Recent Labs Lab 10/05/14 1133 10/05/14 1653 10/05/14 2056 10/06/14 0729  10/06/14 1141  GLUCAP 150* 112* 179* 126* 112*       SignedDebbe Odea, MD [facility] 10/06/2014, 2:50 PM

## 2014-10-06 NOTE — Progress Notes (Signed)
S: finger feels better  O:Blood pressure 157/73, pulse 60, temperature 97.4 F (36.3 C), temperature source Oral, resp. rate 20, height 6' (1.829 m), weight 110.7 kg (244 lb 0.8 oz), SpO2 92 %.  LIF still swollen, some erythema, no drainage Micro - no growth  A:s/p I&D LIF   P: ok with home d/c today on 10 day oral abx, wash finger with soap and water, apply antibiotic ointment and keep covered. F/u office

## 2014-10-06 NOTE — Progress Notes (Signed)
Patient tolerated the Tramadol,no c/o nausea,has mild soreness to L index finger. Patient out of bed in the recliner chair,eating his lunch. Will continue to monitor.

## 2014-10-07 LAB — WOUND CULTURE: CULTURE: NO GROWTH

## 2014-10-07 LAB — T3, FREE: T3 FREE: 3 pg/mL (ref 2.0–4.4)

## 2014-10-09 ENCOUNTER — Telehealth: Payer: Self-pay | Admitting: *Deleted

## 2014-10-09 NOTE — Telephone Encounter (Signed)
Transition Care Management Follow-up Telephone Call   Date discharged? 10/06/14   How have you been since you were released from the hospital? Improving, no pain.   Do you understand why you were in the hospital? yes   Do you understand the discharge instructions? yes   Where were you discharged to? Home   Items Reviewed:  Medications reviewed: yes  Allergies reviewed: yes  Dietary changes reviewed: no  Referrals reviewed: no   Functional Questionnaire:   Activities of Daily Living (ADLs):   He states they are independent in the following: ambulation, bathing and hygiene, feeding, continence, grooming, toileting and dressing States they require assistance with the following: None   Any transportation issues/concerns?: no   Any patient concerns? no   Confirmed importance and date/time of follow-up visits scheduled yes, 10/12/14 @ 1215  Provider Appointment booked with Dr. Damita Dunnings  Confirmed with patient if condition begins to worsen call PCP or go to the ER.  Patient was given the office number and encouraged to call back with question or concerns.  : yes

## 2014-10-09 NOTE — Telephone Encounter (Signed)
Transitional care call attempted.  Left message for daughter, [name], to return call (per [facility]).

## 2014-10-10 ENCOUNTER — Telehealth: Payer: Self-pay

## 2014-10-10 LAB — CULTURE, BLOOD (ROUTINE X 2)
CULTURE: NO GROWTH
Culture: NO GROWTH

## 2014-10-10 LAB — ANAEROBIC CULTURE

## 2014-10-10 NOTE — Telephone Encounter (Signed)
[name] case mgr with [employer] left v/m requesting cb if pt has dx of CHF. Pt problem list has listed chronic combined systolic and diastolic CHF. Dx #  I 50.42. Left detailed v/m as requested.

## 2014-10-12 ENCOUNTER — Ambulatory Visit (INDEPENDENT_AMBULATORY_CARE_PROVIDER_SITE_OTHER): Payer: Medicare Other | Admitting: Family Medicine

## 2014-10-12 ENCOUNTER — Encounter: Payer: Self-pay | Admitting: Family Medicine

## 2014-10-12 VITALS — BP 138/52 | HR 74 | Temp 98.2°F | Wt 239.0 lb

## 2014-10-12 DIAGNOSIS — E876 Hypokalemia: Secondary | ICD-10-CM | POA: Diagnosis not present

## 2014-10-12 DIAGNOSIS — R946 Abnormal results of thyroid function studies: Secondary | ICD-10-CM | POA: Diagnosis not present

## 2014-10-12 DIAGNOSIS — R7989 Other specified abnormal findings of blood chemistry: Secondary | ICD-10-CM

## 2014-10-12 DIAGNOSIS — L03114 Cellulitis of left upper limb: Secondary | ICD-10-CM | POA: Diagnosis not present

## 2014-10-12 DIAGNOSIS — R748 Abnormal levels of other serum enzymes: Secondary | ICD-10-CM

## 2014-10-12 LAB — BASIC METABOLIC PANEL
BUN: 28 mg/dL — AB (ref 6–23)
CHLORIDE: 102 meq/L (ref 96–112)
CO2: 30 meq/L (ref 19–32)
Calcium: 9.8 mg/dL (ref 8.4–10.5)
Creatinine, Ser: 1.35 mg/dL (ref 0.40–1.50)
GFR: 54.35 mL/min — ABNORMAL LOW (ref >60.00)
GLUCOSE: 165 mg/dL — AB (ref 70–99)
POTASSIUM: 4.1 meq/L (ref 3.5–5.1)
SODIUM: 139 meq/L (ref 135–145)

## 2014-10-12 NOTE — Progress Notes (Signed)
Pre visit review using our clinic review tool, if applicable. No additional management support is needed unless otherwise documented below in the visit note.  TCM visit.  Admitted with L 2nd DIP septic arthritis.  S/p I&D, on abx.  No pain, no fevers.  Wound healing, has f/u with hand clinic pending.  Thyroid labs d/w pt, recheck labs were okay during hospitalization.  F/u for Cr elevation and K abnormality pending.  He has stopped nsaids, d/w pt.  See AVS.  F/u culture data neg, d/w pt.   He feels well.  Back on regular BP meds.  Not lightheaded.   Sugar was 118 this AM.    Meds, vitals, and allergies reviewed.   ROS: See HPI.  Otherwise, noncontributory.  nad ncat Mmm rrr ctab abd soft L hand with clean appearing I&D site on the L 2nd DIP, no spreading erythema.  Not puffy.  Rebandaged.

## 2014-10-12 NOTE — Assessment & Plan Note (Signed)
Resolved, will finish abx and f/u with hand clinic.  Doing well.

## 2014-10-12 NOTE — Patient Instructions (Signed)
Don't take aleve or ibuprofen.  Take tylenol if needed for pain.   Go to the lab on the way out.  We'll contact you with your lab report. We'll go from there.   Take care. Glad to see you.

## 2014-10-12 NOTE — Assessment & Plan Note (Signed)
And elevated Cr, off nsaids.  Recheck bmet pendin.  Avoid nsaids.  See notes on labs.

## 2014-10-12 NOTE — Assessment & Plan Note (Signed)
Recheck tsh okay, t3 t4 okay, d/w pt.

## 2014-10-18 ENCOUNTER — Other Ambulatory Visit: Payer: Self-pay | Admitting: Family Medicine

## 2014-10-24 ENCOUNTER — Telehealth: Payer: Self-pay | Admitting: Family Medicine

## 2014-10-24 ENCOUNTER — Encounter: Payer: Medicare Other | Admitting: Neurology

## 2014-10-25 NOTE — Telephone Encounter (Signed)
Recevied refill faxed request from [facility] for clonazepam.  Last prescribed on 10/06/14 by Dr Debbe Odea, MD.  Last seen on 10/12/14. Next appointment on 12/07/14.

## 2014-10-26 NOTE — Telephone Encounter (Signed)
He had  90 day supple with 1 refill done on 10/06/14.  We shouldn't do this rx now.  Thanks.

## 2014-11-03 ENCOUNTER — Other Ambulatory Visit: Payer: Medicare Other

## 2014-11-06 ENCOUNTER — Ambulatory Visit: Payer: Medicare Other | Admitting: Neurology

## 2014-11-10 ENCOUNTER — Ambulatory Visit: Payer: Medicare Other | Admitting: Family Medicine

## 2014-11-24 DIAGNOSIS — R3916 Straining to void: Secondary | ICD-10-CM | POA: Diagnosis not present

## 2014-11-24 DIAGNOSIS — N401 Enlarged prostate with lower urinary tract symptoms: Secondary | ICD-10-CM | POA: Diagnosis not present

## 2014-11-24 DIAGNOSIS — N200 Calculus of kidney: Secondary | ICD-10-CM | POA: Diagnosis not present

## 2014-11-24 DIAGNOSIS — R8271 Bacteriuria: Secondary | ICD-10-CM | POA: Diagnosis not present

## 2014-12-01 ENCOUNTER — Other Ambulatory Visit (INDEPENDENT_AMBULATORY_CARE_PROVIDER_SITE_OTHER): Payer: Medicare Other

## 2014-12-01 DIAGNOSIS — E1121 Type 2 diabetes mellitus with diabetic nephropathy: Secondary | ICD-10-CM

## 2014-12-01 LAB — HEMOGLOBIN A1C: HEMOGLOBIN A1C: 7 % — AB (ref 4.6–6.5)

## 2014-12-01 LAB — TSH: TSH: 3.22 u[IU]/mL (ref 0.35–4.50)

## 2014-12-07 ENCOUNTER — Encounter: Payer: Self-pay | Admitting: Family Medicine

## 2014-12-07 ENCOUNTER — Ambulatory Visit (INDEPENDENT_AMBULATORY_CARE_PROVIDER_SITE_OTHER): Payer: Medicare Other | Admitting: Family Medicine

## 2014-12-07 VITALS — BP 126/56 | HR 67 | Temp 97.6°F | Wt 245.0 lb

## 2014-12-07 DIAGNOSIS — R2681 Unsteadiness on feet: Secondary | ICD-10-CM

## 2014-12-07 NOTE — Addendum Note (Signed)
Addended by: Tonia Ghent on: 12/07/2014 10:57 AM   Modules accepted: Orders, Medications

## 2014-12-07 NOTE — Patient Instructions (Signed)
Recheck in about 6 month with labs before a physical.  If you have high sugars in the meantime, then let me know.  Take care.  Glad to see you.  Don't change your meds for now.

## 2014-12-07 NOTE — Assessment & Plan Note (Signed)
MR with Lumbar spondylosis and degenerative disc disease, causing moderate impingement at L3-4 ; mild to moderate impingement at L5-S1; mild impingement at L4-5, as detailed above.  D/w pt.  I encouraged ortho f/u.  He understood.

## 2014-12-07 NOTE — Progress Notes (Signed)
He had MRI with degenerative change.  D/w pt about f/u with ortho.  He has seen Dr. Elon Alas.  I encouraged him to f/u with ortho about his back situation, to see if he could get some improvement in his gait.  No recent falls.  No more tremor in the meantime.  No more episodes like he had after attending a funeral prev.    Diabetes:  Using medications without difficulties:yes Hypoglycemic episodes:no Hyperglycemic episodes:no  Feet problems: no  Blood Sugars averaging: 120-180 usually in the Am.   eye exam within last year:yes A1c okay, d/w pt.   He hasn't had to use short acting insulin recently, removed from med list.  He feels well overall.  Diet d/w pt.   Meds, vitals, and allergies reviewed.   ROS: See HPI.  Otherwise negative.    GEN: nad, alert and oriented HEENT: mucous membranes moist NECK: supple w/o LA CV: rrr. PULM: ctab, no inc wob ABD: soft, +bs EXT: no edema SKIN: no acute rash  Diabetic foot exam: Normal inspection No skin breakdown No calluses  1+ DP pulses (decreased from radial pulses but still palpable) Normal sensation to light touch and monofilament Nails thickened.

## 2015-02-07 ENCOUNTER — Other Ambulatory Visit: Payer: Self-pay | Admitting: Family Medicine

## 2015-02-13 DIAGNOSIS — M1711 Unilateral primary osteoarthritis, right knee: Secondary | ICD-10-CM | POA: Diagnosis not present

## 2015-02-13 DIAGNOSIS — M1712 Unilateral primary osteoarthritis, left knee: Secondary | ICD-10-CM | POA: Diagnosis not present

## 2015-03-13 DIAGNOSIS — H35371 Puckering of macula, right eye: Secondary | ICD-10-CM | POA: Diagnosis not present

## 2015-03-13 DIAGNOSIS — H26491 Other secondary cataract, right eye: Secondary | ICD-10-CM | POA: Diagnosis not present

## 2015-03-13 DIAGNOSIS — H40011 Open angle with borderline findings, low risk, right eye: Secondary | ICD-10-CM | POA: Diagnosis not present

## 2015-03-13 DIAGNOSIS — E119 Type 2 diabetes mellitus without complications: Secondary | ICD-10-CM | POA: Diagnosis not present

## 2015-03-13 DIAGNOSIS — H2512 Age-related nuclear cataract, left eye: Secondary | ICD-10-CM | POA: Diagnosis not present

## 2015-03-13 LAB — HM DIABETES EYE EXAM

## 2015-03-20 ENCOUNTER — Encounter: Payer: Self-pay | Admitting: Family Medicine

## 2015-03-26 ENCOUNTER — Other Ambulatory Visit: Payer: Self-pay | Admitting: Family Medicine

## 2015-04-13 ENCOUNTER — Other Ambulatory Visit: Payer: Self-pay | Admitting: Neurology

## 2015-04-20 ENCOUNTER — Encounter (HOSPITAL_COMMUNITY): Payer: Self-pay | Admitting: Emergency Medicine

## 2015-04-20 ENCOUNTER — Emergency Department (HOSPITAL_COMMUNITY)
Admission: EM | Admit: 2015-04-20 | Discharge: 2015-04-20 | Disposition: A | Discharge status: Home / Self Care | Payer: Medicare Other | Attending: Emergency Medicine | Admitting: Emergency Medicine

## 2015-04-20 ENCOUNTER — Emergency Department (HOSPITAL_COMMUNITY): Payer: Medicare Other

## 2015-04-20 DIAGNOSIS — F419 Anxiety disorder, unspecified: Secondary | ICD-10-CM | POA: Diagnosis not present

## 2015-04-20 DIAGNOSIS — M5412 Radiculopathy, cervical region: Secondary | ICD-10-CM | POA: Diagnosis not present

## 2015-04-20 DIAGNOSIS — Z794 Long term (current) use of insulin: Secondary | ICD-10-CM | POA: Diagnosis not present

## 2015-04-20 DIAGNOSIS — Z87438 Personal history of other diseases of male genital organs: Secondary | ICD-10-CM | POA: Insufficient documentation

## 2015-04-20 DIAGNOSIS — E119 Type 2 diabetes mellitus without complications: Secondary | ICD-10-CM | POA: Insufficient documentation

## 2015-04-20 DIAGNOSIS — Z7984 Long term (current) use of oral hypoglycemic drugs: Secondary | ICD-10-CM | POA: Insufficient documentation

## 2015-04-20 DIAGNOSIS — M1990 Unspecified osteoarthritis, unspecified site: Secondary | ICD-10-CM | POA: Diagnosis not present

## 2015-04-20 DIAGNOSIS — Z87442 Personal history of urinary calculi: Secondary | ICD-10-CM | POA: Insufficient documentation

## 2015-04-20 DIAGNOSIS — I517 Cardiomegaly: Secondary | ICD-10-CM | POA: Diagnosis not present

## 2015-04-20 DIAGNOSIS — Z79899 Other long term (current) drug therapy: Secondary | ICD-10-CM | POA: Diagnosis not present

## 2015-04-20 DIAGNOSIS — I252 Old myocardial infarction: Secondary | ICD-10-CM | POA: Diagnosis not present

## 2015-04-20 DIAGNOSIS — K529 Noninfective gastroenteritis and colitis, unspecified: Secondary | ICD-10-CM | POA: Diagnosis not present

## 2015-04-20 DIAGNOSIS — Z7982 Long term (current) use of aspirin: Secondary | ICD-10-CM | POA: Insufficient documentation

## 2015-04-20 DIAGNOSIS — Z86010 Personal history of colonic polyps: Secondary | ICD-10-CM | POA: Insufficient documentation

## 2015-04-20 DIAGNOSIS — I2510 Atherosclerotic heart disease of native coronary artery without angina pectoris: Secondary | ICD-10-CM | POA: Diagnosis not present

## 2015-04-20 DIAGNOSIS — I10 Essential (primary) hypertension: Secondary | ICD-10-CM | POA: Diagnosis not present

## 2015-04-20 DIAGNOSIS — R42 Dizziness and giddiness: Secondary | ICD-10-CM | POA: Diagnosis present

## 2015-04-20 LAB — I-STAT TROPONIN, ED: Troponin i, poc: 0.01 ng/mL (ref 0.00–0.08)

## 2015-04-20 LAB — CBC
HEMATOCRIT: 48.4 % (ref 39.0–52.0)
HEMOGLOBIN: 16.8 g/dL (ref 13.0–17.0)
MCH: 33.1 pg (ref 26.0–34.0)
MCHC: 34.7 g/dL (ref 30.0–36.0)
MCV: 95.3 fL (ref 78.0–100.0)
Platelets: 249 10*3/uL (ref 150–400)
RBC: 5.08 MIL/uL (ref 4.22–5.81)
RDW: 13.6 % (ref 11.5–15.5)
WBC: 9.7 10*3/uL (ref 4.0–10.5)

## 2015-04-20 LAB — BASIC METABOLIC PANEL
Anion gap: 15 (ref 5–15)
BUN: 24 mg/dL — ABNORMAL HIGH (ref 6–20)
CHLORIDE: 100 mmol/L — AB (ref 101–111)
CO2: 21 mmol/L — ABNORMAL LOW (ref 22–32)
Calcium: 9.2 mg/dL (ref 8.9–10.3)
Creatinine, Ser: 1.19 mg/dL (ref 0.61–1.24)
GFR calc Af Amer: >60 mL/min (ref >60)
GFR, EST NON AFRICAN AMERICAN: 57 mL/min — AB (ref >60)
Glucose, Bld: 220 mg/dL — ABNORMAL HIGH (ref 65–99)
Potassium: 3.8 mmol/L (ref 3.5–5.1)
SODIUM: 136 mmol/L (ref 135–145)

## 2015-04-20 MED ORDER — TRAMADOL HCL 50 MG PO TABS: 100.0000 mg | ORAL_TABLET | Freq: Four times a day (QID) | ORAL | Status: DC | PRN

## 2015-04-20 MED ORDER — TRAMADOL HCL 50 MG PO TABS
100.0000 mg | ORAL_TABLET | Freq: Once | ORAL | Status: AC
  Administered 2015-04-20: 100 mg via ORAL
  Filled 2015-04-20: qty 2

## 2015-04-20 NOTE — ED Notes (Signed)
Patient transported to X-ray 

## 2015-04-20 NOTE — ED Notes (Signed)
Pt reports dizziness, bilateral arm pain, nausea and tremors since this am. Denies CP.

## 2015-04-20 NOTE — ED Notes (Signed)
PT was unstable while in triage bathroom. Lower PT back into chair. PT transfer to rm 9

## 2015-04-20 NOTE — ED Provider Notes (Signed)
CSN: [id number]     Arrival date & time 04/20/15  1533 History   First MD Initiated Contact with Patient 04/20/15 1612     Chief Complaint  Patient presents with  . Dizziness     (Consider location/radiation/quality/duration/timing/severity/associated sxs/prior Treatment) HPI Patient has had chronic intermittent tremors that have had extensive diagnostic workup including transfer to [facility] for neurology subspecialty evaluation. Final diagnosis per family was tremors. It is been speculated that they are made worse by anxiety. Patient has also had history of a cervical spine fracture that is distant. He denies chronic pain however did have an MRI of the cervical spine last summer and has extensive degenerative disease. Over the past week he's been experiencing pain across the tops of both of his shoulders and into his upper arms. He tried an aspirin without relief. He denies chest pain in association with this. This morning he developed vomiting and diarrhea which is an atypical symptom for him. He reports several episodes of vomiting and several episodes of diarrhea with no associated fever or abdominal pain. Past Medical History  Diagnosis Date  . CAD (coronary artery disease)   . History of syncope   . HTN (hypertension)   . Borderline hyperlipidemia   . History of gastritis   . Diverticulosis of colon   . Colon polyps   . Hypertrophy of prostate with urinary obstruction and other lower urinary tract symptoms (LUTS)   . History of pyelonephritis   . DJD (degenerative joint disease)   . Anxiety   . DM (diabetes mellitus) ([place])     Adult onset  . Myocardial infarction ([place])     MORE THAN 10 YRS AGO - TX'D MEDICALLY - NO STENTS     DR. WALL CARDIOLOGIST  . History of kidney stones   . Myoclonus    Past Surgical History  Procedure Laterality Date  . Appendectomy    . Cataract extraction      RIGHT EYE  . Knee surgery      bilateral ARTHROSCOPY X2 TO EACH KNEE  . Shoulder  surgery  04/2005    left by Dr. Berenice Primas  . 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 S/p cysto & stents for kidnedy stone 11/08 by dr. Reece Agar    . Cystoscopy  04/17/11    stent placed  . Cystoscopy w/ ureteral stent placement  04/17/2011    Procedure: CYSTOSCOPY WITH RETROGRADE PYELOGRAM/URETERAL STENT PLACEMENT;  Surgeon: Hanley Ben, MD;  Location: [facility];  Service: Urology;  Laterality: Left;  . Cystoscopy with retrograde pyelogram, ureteroscopy and stent placement Left 04/18/2013    Procedure: CYSTOSCOPY WITH LEFT RETROGRADE PYELOGRAM, LEFT URETEROSCOPY AND LASER LITHOTRIPSY LEFT STENT PLACEMENT;  Surgeon: Dutch Gray, MD;  Location: [facility];  Service: Urology;  Laterality: Left;  . Holmium laser application Left [id number]    Procedure: HOLMIUM LASER APPLICATION;  Surgeon: Dutch Gray, MD;  Location: [facility];  Service: Urology;  Laterality: Left;  . Cholecystectomy    . Irrigation and debridement abscess Left 10/05/2014    Procedure: IAND D LEFT INDEX FINGER;  Surgeon: Dayna Barker, MD;  Location: [facility];  Service: Plastics;  Laterality: Left;   Family History  Problem Relation Age of Onset  . Heart attack Mother   . Dementia Brother   . Colon cancer Neg Hx   . Prostate cancer Neg Hx    Social History  Substance Use Topics  . Smoking  status: Never Smoker   . Smokeless tobacco: Never Used  . Alcohol Use: No    Review of Systems  10 Systems reviewed and are negative for acute change except as noted in the HPI.   Allergies  Codeine phosphate; Morphine; and Metformin and related  Home Medications   Prior to Admission medications   Medication Sig Start Date End Date Taking? Authorizing Provider  amLODipine (NORVASC) 10 MG tablet Take 1 tablet by mouth  every morning 02/07/15  Yes Tonia Ghent, MD  aspirin EC 81 MG tablet Take 81 mg by mouth every evening.    Yes Historical Provider, MD  carvedilol (COREG) 12.5 MG  tablet TAKE 1 TABLET BY MOUTH 2  TIMES DAILY WITH A MEAL. 10/19/14  Yes Tonia Ghent, MD  Cinnamon 500 MG capsule Take 500 mg by mouth 3 (three) times daily.   Yes Historical Provider, MD  COD LIVER OIL PO Take 1 capsule by mouth 3 (three) times daily.   Yes Historical Provider, MD  glimepiride (AMARYL) 4 MG tablet Take one-half tablet by  mouth before breakfast 10/06/14  Yes Debbe Odea, MD  HUMALOG KWIKPEN 100 UNIT/ML KiwkPen INJECT INTO SKIN 3 TIMES DAILY BEFORE MEALS USING SLIDING SCALE. 151 TO 200 = 4 UNITS, 201 TO 250= 6 UNITS, 251 TO 300= 8 UNITS, GREATER THA 03/27/15  Yes Tonia Ghent, MD  hydrALAZINE (APRESOLINE) 50 MG tablet Take 1 tablet by mouth 3  times daily Patient taking differently: Take 1 tablet by mouth daily 10/19/14  Yes Tonia Ghent, MD  hydrochlorothiazide (HYDRODIURIL) 25 MG tablet Take 25 mg by mouth daily.   Yes Historical Provider, MD  lisinopril-hydrochlorothiazide (PRINZIDE,ZESTORETIC) 20-25 MG tablet Take 1 tablet by mouth  every morning 02/07/15  Yes Tonia Ghent, MD  potassium chloride SA (K-DUR,KLOR-CON) 20 MEQ tablet Take 1 tablet (20 mEq total) by mouth daily. Patient taking differently: Take 20 mEq by mouth every evening.  05/12/14  Yes Tonia Ghent, MD  sertraline (ZOLOFT) 25 MG tablet TAKE 1 TABLET BY MOUTH DAILY. 04/13/15  Yes Cameron Sprang, MD  tamsulosin (FLOMAX) 0.4 MG CAPS capsule Take 1 capsule (0.4 mg total) by mouth daily after supper. 05/12/14  Yes Tonia Ghent, MD  [name] SOLOSTAR 300 UNIT/ML SOPN INJECT 12 UNITS INTO THE SKIN AT BEDTIME. 10/06/14  Yes Tonia Ghent, MD  amLODipine (NORVASC) 10 MG tablet Take 0.5 tablets (5 mg total) by mouth every morning. Patient not taking: Reported on 04/20/2015 08/17/14   Tonia Ghent, MD  clonazePAM (KLONOPIN) 0.5 MG tablet Take 1 tablet (0.5 mg total) by mouth 3 (three) times daily. Patient not taking: Reported on 04/20/2015 10/06/14   Debbe Odea, MD  divalproex (DEPAKOTE) 500 MG DR tablet Take 1  tablet by mouth two  times daily Patient not taking: Reported on 04/20/2015 10/19/14   Tonia Ghent, MD  traMADol (ULTRAM) 50 MG tablet Take 1-2 tablets (50-100 mg total) by mouth every 6 (six) hours as needed for moderate pain. Patient not taking: Reported on 04/20/2015 10/06/14   Debbe Odea, MD  traMADol (ULTRAM) 50 MG tablet Take 2 tablets (100 mg total) by mouth every 6 (six) hours as needed for moderate pain. 04/20/15   Charlesetta Shanks, MD   BP 172/77 mmHg  Pulse 94  Temp(Src) 98.2 F (36.8 C) (Oral)  Resp 23  SpO2 97% Physical Exam  Constitutional: He is oriented to person, place, and time. He appears well-developed and well-nourished.  HENT:  Head: Normocephalic and atraumatic.  Nose: Nose normal.  Mouth/Throat: Oropharynx is clear and moist.  Eyes: EOM are normal. Pupils are equal, round, and reactive to light.  Neck: Neck supple. No tracheal deviation present. No thyromegaly present.  Cardiovascular: Normal rate, regular rhythm, normal heart sounds and intact distal pulses.   Pulmonary/Chest: Effort normal and breath sounds normal. No stridor.  Abdominal: Soft. Bowel sounds are normal. He exhibits no distension. There is no tenderness.  Musculoskeletal: Normal range of motion. He exhibits no edema or tenderness.  Lymphadenopathy:    He has no cervical adenopathy.  Neurological: He is alert and oriented to person, place, and time. He has normal strength. No cranial nerve deficit. He exhibits normal muscle tone. Coordination normal. GCS eye subscore is 4. GCS verbal subscore is 5. GCS motor subscore is 6.  5 out of 5 grip strength bilaterally. No deficit to light touch. Normal function of bilateral upper extremities.  Skin: Skin is warm, dry and intact.  Psychiatric: He has a normal mood and affect.    ED Course  Procedures (including critical care time) Labs Review Labs Reviewed  BASIC METABOLIC PANEL - Abnormal; Notable for the following:    Chloride 100 (*)    CO2 21 (*)     Glucose, Bld 220 (*)    BUN 24 (*)    GFR calc non Af Amer 57 (*)    All other components within normal limits  CBC  I-STAT TROPOININ, ED    Imaging Review Dg Chest 2 View  04/20/2015  CLINICAL DATA:  Dizziness. Bilateral shoulder pain, nausea, and tremors. EXAM: CHEST - 2 VIEW COMPARISON:  None. FINDINGS: Mild cardiac enlargement is present. There is no significant edema or effusion to suggest failure. Atherosclerotic calcifications are again visualized at the aortic arch. The lung volumes are low. No focal airspace disease is evident. A the ankylosis of the thoracic spine is stable. IMPRESSION: 1. No acute cardiopulmonary disease or significant interval change. 2. Stable cardiomegaly without failure. Electronically Signed   By: San Morelle M.D.   On: 04/20/2015 16:02   I have personally reviewed and evaluated these images and lab results as part of my medical decision-making.   EKG Interpretation   Date/Time:  Friday April 20 2015 15:46:01 EST Ventricular Rate:  94 PR Interval:  174 QRS Duration: 105 QT Interval:  369 QTC Calculation: 461 R Axis:   64 Text Interpretation:  Sinus rhythm Inferior infarct, old Consider anterior  infarct agree.no change from old Confirmed by Johnney Killian, MD, Jeannie Done [phone])  on 04/20/2015 4:12:26 PM      MDM   Final diagnoses:  Gastroenteritis  Cervical radiculopathy   Patient developed vomiting and diarrhea today. The vomiting and diarrhea are consistent with gastroenteritis. He is well in appearance and nontoxic. He is not hypotensive or tachycardic. Labs do not suggest acute dehydration. Patient has no associated abdominal pain. At this point I do feel he states he continued outpatient management for a likely viral gastroenteritis. His cervical pain and arm pain complaint appear to be more chronic in nature. Patient has had MRI of the cervical spine and has stenosis and significant degenerative disease. He has no weakness. Excellent upper  extremity strength. He is not endorsing sensory differential to light touch. The patient will be given tramadol to take for pain. No associated chest pain, EKG unchanged, normal troponin, no associated dyspnea. History of present illness symptoms are less suggestive of acute ischemic disease. The family described extensive hospitalization and  neurologic consultations as well as subspecialty neurologic consultation at [facility] for the symptoms of tremor and some degree of dizziness which the patient does not currently have. Patient is not currently having any tremor. At this point I recommend follow-up with family physician at the beginning of the week. Signs and symptoms for return are reviewed.    Charlesetta Shanks, MD 04/20/15 [phone]

## 2015-04-20 NOTE — Discharge Instructions (Signed)
Cervical Radiculopathy Cervical radiculopathy happens when a nerve in the neck (cervical nerve) is pinched or bruised. This condition can develop because of an injury or as part of the normal aging process. Pressure on the cervical nerves can cause pain or numbness that runs from the neck all the way down into the arm and fingers. Usually, this condition gets better with rest. Treatment may be needed if the condition does not improve.  CAUSES This condition may be caused by:  Injury.  Slipped (herniated) disk.  Muscle tightness in the neck because of overuse.  Arthritis.  Breakdown or degeneration in the bones and joints of the spine (spondylosis) due to aging.  Bone spurs that may develop near the cervical nerves. SYMPTOMS Symptoms of this condition include:  Pain that runs from the neck to the arm and hand. The pain can be severe or irritating. It may be worse when the neck is moved.  Numbness or weakness in the affected arm and hand. DIAGNOSIS This condition may be diagnosed based on symptoms, medical history, and a physical exam. You may also have tests, including:  X-rays.  CT scan.  MRI.  Electromyogram (EMG).  Nerve conduction tests. TREATMENT In many cases, treatment is not needed for this condition. With rest, the condition usually gets better over time. If treatment is needed, options may include:  Wearing a soft neck collar for short periods of time.  Physical therapy to strengthen your neck muscles.  Medicines, such as NSAIDs, oral corticosteroids, or spinal injections.  Surgery. This may be needed if other treatments do not help. Various types of surgery may be done depending on the cause of your problems. HOME CARE INSTRUCTIONS Managing Pain  Take over-the-counter and prescription medicines only as told by your health care provider.  If directed, apply ice to the affected area.  Put ice in a plastic bag.  Place a towel between your skin and the  bag.  Leave the ice on for 20 minutes, 2-3 times per day.  If ice does not help, you can try using heat. Take a warm shower or warm bath, or use a heat pack as told by your health care provider.  Try a gentle neck and shoulder massage to help relieve symptoms. Activity  Rest as needed. Follow instructions from your health care provider about any restrictions on activities.  Do stretching and strengthening exercises as told by your health care provider or physical therapist. General Instructions  If you were given a soft collar, wear it as told by your health care provider.  Use a flat pillow when you sleep.  Keep all follow-up visits as told by your health care provider. This is important. SEEK MEDICAL CARE IF:  Your condition does not improve with treatment. SEEK IMMEDIATE MEDICAL CARE IF:  Your pain gets much worse and cannot be controlled with medicines.  You have weakness or numbness in your hand, arm, face, or leg.  You have a high fever.  You have a stiff, rigid neck.  You lose control of your bowels or your bladder (have incontinence).  You have trouble with walking, balance, or speaking.   This information is not intended to replace advice given to you by your health care provider. Make sure you discuss any questions you have with your health care provider.   Document Released: 10/22/2000 Document Revised: 10/18/2014 Document Reviewed: 03/23/2014 Elsevier Interactive Patient Education 2016 [employer]. Viral Gastroenteritis Viral gastroenteritis is also known as stomach flu. This condition affects the  stomach and intestinal tract. It can cause sudden diarrhea and vomiting. The illness typically lasts 3 to 8 days. Most people develop an immune response that eventually gets rid of the virus. While this natural response develops, the virus can make you quite ill. CAUSES  Many different viruses can cause gastroenteritis, such as rotavirus or noroviruses. You can catch  one of these viruses by consuming contaminated food or water. You may also catch a virus by sharing utensils or other personal items with an infected person or by touching a contaminated surface. SYMPTOMS  The most common symptoms are diarrhea and vomiting. These problems can cause a severe loss of body fluids (dehydration) and a body salt (electrolyte) imbalance. Other symptoms may include:  Fever.  Headache.  Fatigue.  Abdominal pain. DIAGNOSIS  Your caregiver can usually diagnose viral gastroenteritis based on your symptoms and a physical exam. A stool sample may also be taken to test for the presence of viruses or other infections. TREATMENT  This illness typically goes away on its own. Treatments are aimed at rehydration. The most serious cases of viral gastroenteritis involve vomiting so severely that you are not able to keep fluids down. In these cases, fluids must be given through an intravenous line (IV). HOME CARE INSTRUCTIONS   Drink enough fluids to keep your urine clear or pale yellow. Drink small amounts of fluids frequently and increase the amounts as tolerated.  Ask your caregiver for specific rehydration instructions.  Avoid:  Foods high in sugar.  Alcohol.  Carbonated drinks.  Tobacco.  Juice.  Caffeine drinks.  Extremely hot or cold fluids.  Fatty, greasy foods.  Too much intake of anything at one time.  Dairy products until 24 to 48 hours after diarrhea stops.  You may consume probiotics. Probiotics are active cultures of beneficial bacteria. They may lessen the amount and number of diarrheal stools in adults. Probiotics can be found in yogurt with active cultures and in supplements.  Wash your hands well to avoid spreading the virus.  Only take over-the-counter or prescription medicines for pain, discomfort, or fever as directed by your caregiver. Do not give aspirin to children. Antidiarrheal medicines are not recommended.  Ask your caregiver if  you should continue to take your regular prescribed and over-the-counter medicines.  Keep all follow-up appointments as directed by your caregiver. SEEK IMMEDIATE MEDICAL CARE IF:   You are unable to keep fluids down.  You do not urinate at least once every 6 to 8 hours.  You develop shortness of breath.  You notice blood in your stool or vomit. This may look like coffee grounds.  You have abdominal pain that increases or is concentrated in one small area (localized).  You have persistent vomiting or diarrhea.  You have a fever.  The patient is a child younger than 3 months, and he or she has a fever.  The patient is a child older than 3 months, and he or she has a fever and persistent symptoms.  The patient is a child older than 3 months, and he or she has a fever and symptoms suddenly get worse.  The patient is a baby, and he or she has no tears when crying. MAKE SURE YOU:   Understand these instructions.  Will watch your condition.  Will get help right away if you are not doing well or get worse.   This information is not intended to replace advice given to you by your health care provider. Make sure you discuss  any questions you have with your health care provider.   Document Released: 01/27/2005 Document Revised: 04/21/2011 Document Reviewed: 11/13/2010 Elsevier Interactive Patient Education [employer].

## 2015-04-26 ENCOUNTER — Other Ambulatory Visit: Payer: Self-pay | Admitting: Family Medicine

## 2015-05-17 ENCOUNTER — Other Ambulatory Visit: Payer: Self-pay | Admitting: Family Medicine

## 2015-05-30 ENCOUNTER — Other Ambulatory Visit: Payer: Self-pay | Admitting: Family Medicine

## 2015-06-07 ENCOUNTER — Ambulatory Visit: Payer: Medicare Other | Admitting: Family Medicine

## 2015-06-14 ENCOUNTER — Encounter: Payer: Self-pay | Admitting: Family Medicine

## 2015-06-14 ENCOUNTER — Ambulatory Visit (INDEPENDENT_AMBULATORY_CARE_PROVIDER_SITE_OTHER): Payer: Medicare Other | Admitting: Family Medicine

## 2015-06-14 VITALS — BP 130/70 | HR 68 | Temp 97.8°F | Wt 236.5 lb

## 2015-06-14 DIAGNOSIS — M170 Bilateral primary osteoarthritis of knee: Secondary | ICD-10-CM | POA: Diagnosis not present

## 2015-06-14 DIAGNOSIS — R2681 Unsteadiness on feet: Secondary | ICD-10-CM

## 2015-06-14 DIAGNOSIS — E1159 Type 2 diabetes mellitus with other circulatory complications: Secondary | ICD-10-CM

## 2015-06-14 DIAGNOSIS — E119 Type 2 diabetes mellitus without complications: Secondary | ICD-10-CM | POA: Diagnosis not present

## 2015-06-14 LAB — HEMOGLOBIN A1C: Hgb A1c MFr Bld: 8.4 % — ABNORMAL HIGH (ref 4.6–6.5)

## 2015-06-14 NOTE — Assessment & Plan Note (Signed)
H/o gait changes and tremor. Off depakote and BZD for months. No tremor or gait changes in the meantime. He basically back to baseline per his report. No falls. He feels well. Prev w/u w/o completely clear dx for all of his sx. At this point, he he is asymptomatic. He wanted to stay off as many meds as possible.  D/w pt.  Would continue as is.   Prev MRI noted: 1. A transitional lumbosacral vertebra is assumed to represent the S1 level. Careful correlation with this numbering strategy prior to any procedural intervention would be recommended. 2. Lumbar spondylosis and degenerative disc disease, causing moderate impingement at L3-4 ; mild to moderate impingement at L5-S1; mild impingement at L4-5.    Prev neuro notes reviewed but with patient doing well (this is the best I recall seeing him), I would continue as is.   He agrees.   >25 minutes spent in face to face time with patient, >50% spent in counselling or coordination of care.

## 2015-06-14 NOTE — Progress Notes (Signed)
Pre visit review using our clinic review tool, if applicable. No additional management support is needed unless otherwise documented below in the visit note.  H/o gait changes and tremor.  Off depakote and BZD for months.  No tremor or gait changes in the meantime.  He basically back to baseline per his report.  No falls.  He feels well.  Prev w/u w/o completely clear dx for all of his sx.  At this point, he he is asymptomatic.  He wanted to stay off as many meds as possible.    Diabetes:  Using medications without difficulties: yes Hypoglycemic episodes:no Hyperglycemic episodes:no Feet problems:no Blood Sugars averaging: usually 160-200, "according to how many klondike bars I eat."  D/w p about diet.   eye exam within last year:yes Due for A1c, pending.   PMH and SH reviewed  Meds, vitals, and allergies reviewed.   ROS: Per HPI unless specifically indicated in ROS section   GEN: nad, alert and oriented HEENT: mucous membranes moist NECK: supple w/o LA CV: rrr. PULM: ctab, no inc wob ABD: soft, +bs EXT: no edema SKIN: no acute rash  Diabetic foot exam: Normal inspection No skin breakdown No calluses  Normal DP pulses Normal sensation to light touch and monofilament Nails thickened.   Gait steady, not listing to one side or the other.  No tremor. Speech wnl.

## 2015-06-14 NOTE — Assessment & Plan Note (Signed)
Foot exam okay except for thick nails.  D/w pt about avoiding sweets.   Check a1c.  No change in meds today.  He agrees.  Recheck in about 6 months, assuming no concerns.

## 2015-06-14 NOTE — Patient Instructions (Signed)
Go to the lab on the way out.  We'll contact you with your lab report. Recheck at a physical in about 6 months, labs ahead of time.  If any concerns in the meantime, then update me.  Take care.  Glad to see you.

## 2015-06-15 ENCOUNTER — Other Ambulatory Visit: Payer: Self-pay | Admitting: Family Medicine

## 2015-06-19 ENCOUNTER — Encounter: Payer: Self-pay | Admitting: *Deleted

## 2015-06-21 ENCOUNTER — Other Ambulatory Visit: Payer: Self-pay | Admitting: *Deleted

## 2015-06-21 MED ORDER — TRAMADOL HCL 50 MG PO TABS: 100.0000 mg | ORAL_TABLET | Freq: Four times a day (QID) | ORAL | Status: DC | PRN

## 2015-06-21 NOTE — Telephone Encounter (Signed)
Faxed refill request. Last Filled:   04/30/15     #20     0 rf   Please advise.

## 2015-06-21 NOTE — Telephone Encounter (Signed)
plz phone in. 

## 2015-06-22 NOTE — Telephone Encounter (Signed)
Rx called in as directed.   

## 2015-07-19 ENCOUNTER — Other Ambulatory Visit: Payer: Self-pay | Admitting: Family Medicine

## 2015-08-15 ENCOUNTER — Encounter: Payer: Self-pay | Admitting: Family Medicine

## 2015-08-15 ENCOUNTER — Ambulatory Visit (INDEPENDENT_AMBULATORY_CARE_PROVIDER_SITE_OTHER): Payer: Medicare Other | Admitting: Family Medicine

## 2015-08-15 VITALS — BP 140/76 | HR 76 | Temp 98.4°F | Ht 72.0 in | Wt 235.0 lb

## 2015-08-15 DIAGNOSIS — E1142 Type 2 diabetes mellitus with diabetic polyneuropathy: Secondary | ICD-10-CM | POA: Diagnosis not present

## 2015-08-15 DIAGNOSIS — G609 Hereditary and idiopathic neuropathy, unspecified: Secondary | ICD-10-CM | POA: Diagnosis not present

## 2015-08-15 NOTE — Patient Instructions (Signed)
I think you are having neuropathy symptoms, not arthritis.  It may be from the diabetes or other causes.  Let me get your labs back and then we'll go from there.  Go to the lab on the way out.  We'll contact you with your lab report. Take care.  Glad to see you.  Don't change your meds for now.

## 2015-08-15 NOTE — Progress Notes (Signed)
Pre visit review using our clinic review tool, if applicable. No additional management support is needed unless otherwise documented below in the visit note.  Off tramadol recently.  His sugar has been better in the meantime, since cutting out ice cream.    He thought he had arthritis, he family thought he had "DM2 troubles."  Pain walking.  On the bottoms of the feet and hand pain.  Some possible foot and hand swelling.  Burning pain in the feet and hands, same on B sides. Some sensation of needles in the legs. No weakness.  Burning pain clearly better above the knees.  Going on for a few weeks.  More pain with weight bearing, less with sitting/resting.  More pain pain with using his hands, working with tools.    Meds, vitals, and allergies reviewed.   ROS: Per HPI unless specifically indicated in ROS section   nad ncat Mmm rrr ctab abd soft, normal BS Ext w/o edema Dec sensation monofilament on the BLE.  DP pulses intact. Motor fxn grossly intact BLE, no weakness in the hands or feet noted.   Normal cap refill.   No acute joint erythema in the hands or feet noted.

## 2015-08-16 DIAGNOSIS — E1140 Type 2 diabetes mellitus with diabetic neuropathy, unspecified: Secondary | ICD-10-CM | POA: Insufficient documentation

## 2015-08-16 LAB — COMPREHENSIVE METABOLIC PANEL
ALK PHOS: 67 U/L (ref 39–117)
ALT: 20 U/L (ref 0–53)
AST: 17 U/L (ref 0–37)
Albumin: 4.1 g/dL (ref 3.5–5.2)
BILIRUBIN TOTAL: 0.4 mg/dL (ref 0.2–1.2)
BUN: 19 mg/dL (ref 6–23)
CO2: 28 mEq/L (ref 19–32)
CREATININE: 1.07 mg/dL (ref 0.40–1.50)
Calcium: 9.8 mg/dL (ref 8.4–10.5)
Chloride: 103 mEq/L (ref 96–112)
GFR: 70.92 mL/min (ref >60.00)
GLUCOSE: 144 mg/dL — AB (ref 70–99)
Potassium: 3.6 mEq/L (ref 3.5–5.1)
Sodium: 140 mEq/L (ref 135–145)
TOTAL PROTEIN: 6.6 g/dL (ref 6.0–8.3)

## 2015-08-16 LAB — CBC WITH DIFFERENTIAL/PLATELET
BASOS ABS: 0 10*3/uL (ref 0.0–0.1)
Basophils Relative: 0.4 % (ref 0.0–3.0)
EOS ABS: 0.4 10*3/uL (ref 0.0–0.7)
Eosinophils Relative: 6.6 % — ABNORMAL HIGH (ref 0.0–5.0)
HEMATOCRIT: 44.3 % (ref 39.0–52.0)
Hemoglobin: 14.9 g/dL (ref 13.0–17.0)
LYMPHS ABS: 2.2 10*3/uL (ref 0.7–4.0)
LYMPHS PCT: 35.2 % (ref 12.0–46.0)
MCHC: 33.5 g/dL (ref 30.0–36.0)
MCV: 93 fl (ref 78.0–100.0)
Monocytes Absolute: 0.8 10*3/uL (ref 0.1–1.0)
Monocytes Relative: 13.3 % — ABNORMAL HIGH (ref 3.0–12.0)
NEUTROS ABS: 2.7 10*3/uL (ref 1.4–7.7)
NEUTROS PCT: 44.5 % (ref 43.0–77.0)
Platelets: 303 10*3/uL (ref 150.0–400.0)
RBC: 4.77 Mil/uL (ref 4.22–5.81)
RDW: 14.5 % (ref 11.5–15.5)
WBC: 6.2 10*3/uL (ref 4.0–10.5)

## 2015-08-16 LAB — VITAMIN B12: Vitamin B-12: 164 pg/mL — ABNORMAL LOW (ref 211–911)

## 2015-08-16 LAB — TSH: TSH: 2.55 u[IU]/mL (ref 0.35–4.50)

## 2015-08-16 NOTE — Assessment & Plan Note (Signed)
Presumed that DM2 is at least contributing.  Sugar better on home check in the meantime with better diet adherence.  No motor loss.  D/w pt about ddx.  Check basic labs today and we'll go from there.  He agrees.  See AVS. >25 minutes spent in face to face time with patient, >50% spent in counselling or coordination of care.

## 2015-08-17 ENCOUNTER — Other Ambulatory Visit: Payer: Self-pay | Admitting: Family Medicine

## 2015-08-17 DIAGNOSIS — E538 Deficiency of other specified B group vitamins: Secondary | ICD-10-CM | POA: Insufficient documentation

## 2015-08-17 MED ORDER — CYANOCOBALAMIN 1000 MCG/ML IJ SOLN: 1000.0000 ug | INTRAMUSCULAR | Status: DC

## 2015-08-21 ENCOUNTER — Other Ambulatory Visit: Payer: Self-pay | Admitting: Family Medicine

## 2015-08-21 DIAGNOSIS — E538 Deficiency of other specified B group vitamins: Secondary | ICD-10-CM

## 2015-08-21 MED ORDER — "INSULIN SYRINGE/NEEDLE 28G X 1/2"" 1 ML MISC": Status: DC

## 2015-08-21 MED ORDER — CYANOCOBALAMIN 1000 MCG/ML IJ SOLN: 1000.0000 ug | INTRAMUSCULAR | Status: DC

## 2015-10-03 ENCOUNTER — Telehealth: Payer: Self-pay

## 2015-10-03 NOTE — Telephone Encounter (Signed)
Havery Moros nurse case mgr with [facility] left v/m requesting latest BP and P. Pt was seen 08/15/15 BP 140/76 P 76. Left v/m on confidential v/m for Caryl Pina with this info.

## 2015-10-04 ENCOUNTER — Other Ambulatory Visit: Payer: Self-pay | Admitting: Family Medicine

## 2015-10-04 NOTE — Telephone Encounter (Signed)
Sent. Thanks.   

## 2015-11-21 ENCOUNTER — Other Ambulatory Visit: Payer: Self-pay | Admitting: Family Medicine

## 2015-11-27 DIAGNOSIS — R3912 Poor urinary stream: Secondary | ICD-10-CM | POA: Diagnosis not present

## 2015-11-27 DIAGNOSIS — N401 Enlarged prostate with lower urinary tract symptoms: Secondary | ICD-10-CM | POA: Diagnosis not present

## 2015-12-11 DIAGNOSIS — M1711 Unilateral primary osteoarthritis, right knee: Secondary | ICD-10-CM | POA: Diagnosis not present

## 2015-12-11 DIAGNOSIS — M1712 Unilateral primary osteoarthritis, left knee: Secondary | ICD-10-CM | POA: Diagnosis not present

## 2015-12-16 ENCOUNTER — Other Ambulatory Visit: Payer: Self-pay | Admitting: Family Medicine

## 2015-12-16 DIAGNOSIS — E538 Deficiency of other specified B group vitamins: Secondary | ICD-10-CM

## 2015-12-16 DIAGNOSIS — E1159 Type 2 diabetes mellitus with other circulatory complications: Secondary | ICD-10-CM

## 2015-12-17 ENCOUNTER — Ambulatory Visit (INDEPENDENT_AMBULATORY_CARE_PROVIDER_SITE_OTHER): Payer: Medicare Other

## 2015-12-17 ENCOUNTER — Other Ambulatory Visit: Payer: Medicare Other

## 2015-12-17 VITALS — BP 138/70 | HR 88 | Temp 98.4°F | Ht 68.25 in | Wt 232.0 lb

## 2015-12-17 DIAGNOSIS — E1159 Type 2 diabetes mellitus with other circulatory complications: Secondary | ICD-10-CM | POA: Diagnosis not present

## 2015-12-17 DIAGNOSIS — Z0000 Encounter for general adult medical examination without abnormal findings: Secondary | ICD-10-CM | POA: Diagnosis not present

## 2015-12-17 DIAGNOSIS — E538 Deficiency of other specified B group vitamins: Secondary | ICD-10-CM | POA: Diagnosis not present

## 2015-12-17 LAB — COMPREHENSIVE METABOLIC PANEL
ALBUMIN: 4.2 g/dL (ref 3.5–5.2)
ALK PHOS: 63 U/L (ref 39–117)
ALT: 23 U/L (ref 0–53)
AST: 11 U/L (ref 0–37)
BILIRUBIN TOTAL: 0.6 mg/dL (ref 0.2–1.2)
BUN: 21 mg/dL (ref 6–23)
CO2: 32 mEq/L (ref 19–32)
Calcium: 9.9 mg/dL (ref 8.4–10.5)
Chloride: 99 mEq/L (ref 96–112)
Creatinine, Ser: 1.21 mg/dL (ref 0.40–1.50)
GFR: 61.49 mL/min (ref >60.00)
GLUCOSE: 337 mg/dL — AB (ref 70–99)
Potassium: 4 mEq/L (ref 3.5–5.1)
Sodium: 139 mEq/L (ref 135–145)
TOTAL PROTEIN: 6.7 g/dL (ref 6.0–8.3)

## 2015-12-17 LAB — LIPID PANEL
CHOLESTEROL: 167 mg/dL (ref 0–200)
HDL: 40.3 mg/dL (ref >39.00)
NonHDL: 126.6
Total CHOL/HDL Ratio: 4
Triglycerides: 288 mg/dL — ABNORMAL HIGH (ref 0.0–149.0)
VLDL: 57.6 mg/dL — ABNORMAL HIGH (ref 0.0–40.0)

## 2015-12-17 LAB — HEMOGLOBIN A1C: HEMOGLOBIN A1C: 8.3 % — AB (ref 4.6–6.5)

## 2015-12-17 LAB — LDL CHOLESTEROL, DIRECT: Direct LDL: 97 mg/dL

## 2015-12-17 LAB — VITAMIN B12: VITAMIN B 12: 471 pg/mL (ref 211–911)

## 2015-12-17 NOTE — Progress Notes (Signed)
Subjective:   [name] is a 81 y.o. male who presents for an Initial Medicare Annual Wellness Visit.  Review of Systems  N/A Cardiac Risk Factors include: advanced age (>58men, >68 women);obesity (BMI >30kg/m2);diabetes mellitus;male gender;hypertension    Objective:    Today's Vitals   12/17/15 1038  BP: 138/70  Pulse: 88  Temp: 98.4 F (36.9 C)  TempSrc: Oral  SpO2: 94%  Weight: 232 lb (105.2 kg)  Height: 5' 8.25" (1.734 m)  PainSc: 0-No pain   Body mass index is 35.02 kg/m.  Current Medications (verified) Outpatient Encounter Prescriptions as of 12/17/2015  Medication Sig  . amLODipine (NORVASC) 10 MG tablet Take 1 tablet by mouth  every morning  . aspirin EC 81 MG tablet Take 81 mg by mouth every evening.   . carvedilol (COREG) 12.5 MG tablet TAKE 1 TABLET BY MOUTH 2  TIMES DAILY WITH A MEAL.  Marland Kitchen Cinnamon 500 MG capsule Take 500 mg by mouth 3 (three) times daily.  . COD LIVER OIL PO Take 1 capsule by mouth 3 (three) times daily.  . cyanocobalamin (,VITAMIN B-12,) 1000 MCG/ML injection Inject 1 mL (1,000 mcg total) into the muscle every 30 (thirty) days.  Marland Kitchen glimepiride (AMARYL) 4 MG tablet Take one-half tablet by  mouth before breakfast  . glucose blood (ONE TOUCH ULTRA TEST) test strip Use as instructed  . HUMALOG KWIKPEN 100 UNIT/ML KiwkPen INJECT INTO SKIN 3 TIMES DAILY BEFORE MEALS USING SLIDING SCALE. 151 TO 200 = 4 UNITS, 201 TO 250= 6 UNITS, 251 TO 300= 8 UNITS, GREATER THA  . hydrALAZINE (APRESOLINE) 50 MG tablet Take 1 tablet by mouth 3  times daily (Patient taking differently: Take 1 tablet by mouth 3 times daily)  . INS SYRINGE/NEEDLE 1CC/28G (B-D INSULIN SYRINGE 1CC/28G) 28G X 1/2" 1 ML MISC Use monthly with B12 shot.  Marland Kitchen lisinopril-hydrochlorothiazide (PRINZIDE,ZESTORETIC) 20-25 MG tablet Take 1 tablet by mouth  every morning  . ONE TOUCH ULTRA TEST test strip Check up to 3 times daily  as needed  . potassium chloride SA (K-DUR,KLOR-CON) 20 MEQ tablet  Take 1 tablet by mouth  daily  . tamsulosin (FLOMAX) 0.4 MG CAPS capsule Take 1 capsule by mouth  daily after supper  . [name] SOLOSTAR 300 UNIT/ML SOPN INJECT 12 UNITS INTO THE SKIN AT BEDTIME.  . traMADol (ULTRAM) 50 MG tablet Take 2 tablets (100 mg total) by mouth every 6 (six) hours as needed for moderate pain.  . [DISCONTINUED] hydrALAZINE (APRESOLINE) 50 MG tablet Take 50 mg by mouth 3 (three) times daily.   No facility-administered encounter medications on file as of 12/17/2015.     Allergies (verified) Codeine phosphate; Morphine; and Metformin and related   History: Past Medical History:  Diagnosis Date  . Anxiety   . Borderline hyperlipidemia   . CAD (coronary artery disease)   . Colon polyps   . Diverticulosis of colon   . DJD (degenerative joint disease)   . DM (diabetes mellitus) ([place])    Adult onset  . History of gastritis   . History of kidney stones   . History of pyelonephritis   . History of syncope   . HTN (hypertension)   . Hypertrophy of prostate with urinary obstruction and other lower urinary tract symptoms (LUTS)   . Myocardial infarction    MORE THAN 10 YRS AGO - TX'D MEDICALLY - NO STENTS     DR. WALL CARDIOLOGIST  . Myoclonus    Past Surgical History:  Procedure Laterality Date  . APPENDECTOMY    . CATARACT EXTRACTION     RIGHT EYE  . CHOLECYSTECTOMY    . cystoscopy  04/17/11   stent placed  . CYSTOSCOPY W/ URETERAL STENT PLACEMENT  04/17/2011   Procedure: CYSTOSCOPY WITH RETROGRADE PYELOGRAM/URETERAL STENT PLACEMENT;  Surgeon: Hanley Ben, MD;  Location: [facility];  Service: Urology;  Laterality: Left;  . CYSTOSCOPY WITH RETROGRADE PYELOGRAM, URETEROSCOPY AND STENT PLACEMENT Left 04/18/2013   Procedure: CYSTOSCOPY WITH LEFT RETROGRADE PYELOGRAM, LEFT URETEROSCOPY AND LASER LITHOTRIPSY LEFT STENT PLACEMENT;  Surgeon: Dutch Gray, MD;  Location: [facility];  Service: Urology;  Laterality: Left;  . HOLMIUM LASER APPLICATION Left [id number]   Procedure:  HOLMIUM LASER APPLICATION;  Surgeon: Dutch Gray, MD;  Location: [facility];  Service: Urology;  Laterality: Left;  . IRRIGATION AND DEBRIDEMENT ABSCESS Left 10/05/2014   Procedure: IAND D LEFT INDEX FINGER;  Surgeon: Dayna Barker, MD;  Location: [facility];  Service: Plastics;  Laterality: Left;  . KNEE SURGERY     bilateral ARTHROSCOPY X2 TO EACH KNEE  . S/P cysto & stents for kidnedy stone 11/08 by Dr. Reece Agar    . 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 SHOULDER SURGERY  04/2005   left by Dr. Berenice Primas   Family History  Problem Relation Age of Onset  . Heart attack Mother   . Dementia Brother   . Colon cancer Neg Hx   . Prostate cancer Neg Hx    Social History   Occupational History  . Retired    Social History Main Topics  . Smoking status: Never Smoker  . Smokeless tobacco: Never Used  . Alcohol use No  . Drug use: No  . Sexual activity: No   Tobacco Counseling Counseling given: No   Activities of Daily Living In your present state of health, do you have any difficulty performing the following activities: 12/17/2015  Hearing? Y  Vision? N  Difficulty concentrating or making decisions? N  Walking or climbing stairs? N  Dressing or bathing? N  Doing errands, shopping? N  Preparing Food and eating ? N  Using the Toilet? N  In the past six months, have you accidently leaked urine? N  Do you have problems with loss of bowel control? N  Managing your Medications? N  Managing your Finances? N  Housekeeping or managing your Housekeeping? N  Some recent data might be hidden    Immunizations and Health Maintenance Immunization History  Administered Date(s) Administered  . Influenza Split 11/30/2010, 11/24/2011  . Influenza Whole 03/27/2009  . Influenza, High Dose Seasonal PF 12/05/2014  . Influenza,inj,Quad PF,36+ Mos 12/29/2012  . Influenza-Unspecified 11/10/2013  . Pneumococcal Conjugate-13 11/10/2013  .  Pneumococcal Polysaccharide-23 09/06/2004  . Td 02/12/2009  . Tdap 10/09/2011, 08/15/2014  . Zoster 07/28/2013   There are no preventive care reminders to display for this patient.  Patient Care Team: Tonia Ghent, MD as PCP - General (Family Medicine) Clent Jacks, MD as Consulting Physician (Ophthalmology)    Assessment:   This is a routine wellness examination for Shawndre.   Hearing/Vision screen Hearing Screening Comments: Wears bilateral hearing aids Vision Screening Comments: Last eye exam with Dr. Katy Fitch (senior) on 02/24/2015  Dietary issues and exercise activities discussed: Current Exercise Habits: Home exercise routine, Type of exercise: calisthenics, Time (Minutes): 30, Frequency (Times/Week): 3, Weekly Exercise (Minutes/Week): 90, Exercise limited by: None identified  Goals    . Increase  physical activity          Starting 12/17/2015, I will continue to exercise for at least 30 min every other day.       Depression Screen PHQ 2/9 Scores 12/17/2015 06/18/2012  PHQ - 2 Score 0 1    Fall Risk Fall Risk  12/17/2015 06/18/2012  Falls in the past year? No No    Cognitive Function: MMSE - Mini Mental State Exam 12/17/2015 05/04/2014  Orientation to time 5 4  Orientation to Place 5 5  Registration 3 3  Attention/ Calculation 0 0  Recall 2 3  Recall-comments pt was unable to recall 1 of 3 words -  Language- name 2 objects 0 2  Language- repeat 1 1  Language- follow 3 step command 3 3  Language- read & follow direction 0 1  Write a sentence 0 1  Copy design 0 0  Total score 19 23       PLEASE NOTE: A Mini-Cog screen was completed. Maximum score is 20. A value of 0 denotes this part of [place]stein MMSE was not completed or the patient failed this part of the Mini-Cog screening.   Mini-Cog Screening Orientation to Time - Max 5 pts Orientation to Place - Max 5 pts Registration - Max 3 pts Recall - Max 3 pts Language Repeat - Max 1 pts Language Follow 3 Step Command -  Max 3 pts   Screening Tests Health Maintenance  Topic Date Due  . HEMOGLOBIN A1C  06/15/2016  . OPHTHALMOLOGY EXAM  03/12/2016  . FOOT EXAM  06/13/2016  . TETANUS/TDAP  08/14/2024  . INFLUENZA VACCINE  Addressed  . ZOSTAVAX  Completed  . PNA vac Low Risk Adult  Completed        Plan:     I have personally reviewed and addressed the Medicare Annual Wellness questionnaire and have noted the following in the patient's chart:  A. Medical and social history B. Use of alcohol, tobacco or illicit drugs  C. Current medications and supplements D. Functional ability and status E.  Nutritional status F.  Physical activity G. Advance directives H. List of other physicians I.  Hospitalizations, surgeries, and ER visits in previous 12 months J.  [place] to include hearing, vision, cognitive, depression L. Referrals and appointments - none  In addition, I have reviewed and discussed with patient certain preventive protocols, quality metrics, and best practice recommendations. A written personalized care plan for preventive services as well as general preventive health recommendations were provided to patient.  See attached scanned questionnaire for additional information.   Signed,   Lindell Noe, MHA, BS, LPN Health Coach

## 2015-12-17 NOTE — Progress Notes (Signed)
PCP notes:   Health maintenance:  A1C - completed today Flu vaccine - per pt report, completed in Oct 2017  Abnormal screenings:   Mini-Cog score: 19/20  Patient concerns:   None  Nurse concerns:  None  Next PCP appt:   12/21/15 @ 0945  I reviewed health advisor's note, was available for consultation on the day of service listed in this note, and agree with documentation and plan. [name], MD.

## 2015-12-17 NOTE — Patient Instructions (Signed)
[name] , Thank you for taking time to come for your Medicare Wellness Visit. I appreciate your ongoing commitment to your health goals. Please review the following plan we discussed and let me know if I can assist you in the future.   These are the goals we discussed: Goals    . Increase physical activity          Starting 12/17/2015, I will continue to exercise for at least 30 min every other day.        This is a list of the screening recommended for you and due dates:  Health Maintenance  Topic Date Due  . Hemoglobin A1C  12/21/2015*  . Eye exam for diabetics  03/12/2016  . Complete foot exam   06/13/2016  . Tetanus Vaccine  08/14/2024  . Flu Shot  Addressed  . Shingles Vaccine  Completed  . Pneumonia vaccines  Completed  *Topic was postponed. The date shown is not the original due date.   Preventive Care for Adults  A healthy lifestyle and preventive care can promote health and wellness. Preventive health guidelines for adults include the following key practices.  . A routine yearly physical is a good way to check with your health care provider about your health and preventive screening. It is a chance to share any concerns and updates on your health and to receive a thorough exam.  . Visit your dentist for a routine exam and preventive care every 6 months. Brush your teeth twice a day and floss once a day. Good oral hygiene prevents tooth decay and gum disease.  . The frequency of eye exams is based on your age, health, family medical history, use  of contact lenses, and other factors. Follow your health care provider's ecommendations for frequency of eye exams.  . Eat a healthy diet. Foods like vegetables, fruits, whole grains, low-fat dairy products, and lean protein foods contain the nutrients you need without too many calories. Decrease your intake of foods high in solid fats, added sugars, and salt. Eat the right amount of calories for you. Get information about a  proper diet from your health care provider, if necessary.  . Regular physical exercise is one of the most important things you can do for your health. Most adults should get at least 150 minutes of moderate-intensity exercise (any activity that increases your heart rate and causes you to sweat) each week. In addition, most adults need muscle-strengthening exercises on 2 or more days a week.  Silver Sneakers may be a benefit available to you. To determine eligibility, you may visit the website: www.silversneakers.com or contact program at 845-654-3713 Mon-Fri between 8AM-8PM.   . Maintain a healthy weight. The body mass index (BMI) is a screening tool to identify possible weight problems. It provides an estimate of body fat based on height and weight. Your health care provider can find your BMI and can help you achieve or maintain a healthy weight.   For adults 20 years and older: ? A BMI below 18.5 is considered underweight. ? A BMI of 18.5 to 24.9 is normal. ? A BMI of 25 to 29.9 is considered overweight. ? A BMI of 30 and above is considered obese.   . Maintain normal blood lipids and cholesterol levels by exercising and minimizing your intake of saturated fat. Eat a balanced diet with plenty of fruit and vegetables. Blood tests for lipids and cholesterol should begin at age 46 and be repeated every 5 years. If your  lipid or cholesterol levels are high, you are over 50, or you are at high risk for heart disease, you may need your cholesterol levels checked more frequently. Ongoing high lipid and cholesterol levels should be treated with medicines if diet and exercise are not working.  . If you smoke, find out from your health care provider how to quit. If you do not use tobacco, please do not start.  . If you choose to drink alcohol, please do not consume more than 2 drinks per day. One drink is considered to be 12 ounces (355 mL) of beer, 5 ounces (148 mL) of wine, or 1.5 ounces (44 mL) of  liquor.  . If you are 73-87 years old, ask your health care provider if you should take aspirin to prevent strokes.  . Use sunscreen. Apply sunscreen liberally and repeatedly throughout the day. You should seek shade when your shadow is shorter than you. Protect yourself by wearing long sleeves, pants, a wide-brimmed hat, and sunglasses year round, whenever you are outdoors.  . Once a month, do a whole body skin exam, using a mirror to look at the skin on your back. Tell your health care provider of new moles, moles that have irregular borders, moles that are larger than a pencil eraser, or moles that have changed in shape or color.

## 2015-12-17 NOTE — Progress Notes (Signed)
Pre visit review using our clinic review tool, if applicable. No additional management support is needed unless otherwise documented below in the visit note. 

## 2015-12-21 ENCOUNTER — Encounter: Payer: Self-pay | Admitting: Family Medicine

## 2015-12-21 ENCOUNTER — Ambulatory Visit (INDEPENDENT_AMBULATORY_CARE_PROVIDER_SITE_OTHER): Payer: Medicare Other | Admitting: Family Medicine

## 2015-12-21 DIAGNOSIS — R259 Unspecified abnormal involuntary movements: Secondary | ICD-10-CM

## 2015-12-21 DIAGNOSIS — I10 Essential (primary) hypertension: Secondary | ICD-10-CM

## 2015-12-21 DIAGNOSIS — E785 Hyperlipidemia, unspecified: Secondary | ICD-10-CM | POA: Diagnosis not present

## 2015-12-21 DIAGNOSIS — E538 Deficiency of other specified B group vitamins: Secondary | ICD-10-CM | POA: Diagnosis not present

## 2015-12-21 DIAGNOSIS — Z0000 Encounter for general adult medical examination without abnormal findings: Secondary | ICD-10-CM

## 2015-12-21 DIAGNOSIS — E1122 Type 2 diabetes mellitus with diabetic chronic kidney disease: Secondary | ICD-10-CM | POA: Diagnosis not present

## 2015-12-21 DIAGNOSIS — M170 Bilateral primary osteoarthritis of knee: Secondary | ICD-10-CM

## 2015-12-21 MED ORDER — TRAMADOL HCL 50 MG PO TABS: 50.0000 mg | ORAL_TABLET | Freq: Two times a day (BID) | ORAL | 1 refills | Status: DC | PRN

## 2015-12-21 NOTE — Patient Instructions (Signed)
Don't change your meds for now.   Recheck A1c before a visit in about 6 months.  Update me sooner if needed.  Take care.  Glad to see you.

## 2015-12-21 NOTE — Progress Notes (Signed)
Diabetes:  Using medications without difficulties: yes Hypoglycemic episodes:no Hyperglycemic episodes:no Feet problems:no Blood Sugars averaging: 150-200 usually before meals eye exam within last year:yes  Hypertension:    Using medication without problems or lightheadedness: yes Chest pain with exertion:no Edema:no Short of breath:no D/w pt about diet and exercise.    B12 def on replacement.  B12 wnl.  Discussed with patient.  Elevated Cholesterol: Off statin in the meantime.  Had aches prev on statin.  D/w pt. Labs d/w pt.    B knee pain.  Had taken tramadol prev with relief.  No ADE.  Out of med, needed refill.  He is trying to put off replacement.   To my knowledge, he still doesn't have clear dx re: prev myoclonus/episodes, but he is clearly better in the meantime without recurrent episodes her symptoms.  Prev memory testing d/w pt.  He knows the year, month, day.  3/3 attention and recall.  Can do math and read a watch.  Still living independently.  No deficits.   D/w pt about colon cancer screening.  No FH.  Likely that colonoscopy would likely be more risky than beneficial now.  He isn't passing blood by his report.    PMH and SH reviewed.   Vital signs, Meds and allergies reviewed.  ROS: Per HPI unless specifically indicated in ROS section   GEN: nad, alert and oriented HEENT: mucous membranes moist NECK: supple w/o LA CV: rrr. PULM: ctab, no inc wob ABD: soft, +bs EXT: no edema SKIN: no acute rash  Diabetic foot exam: Normal inspection No skin breakdown Callus noted on R 1st toe, not ulcerated.   Normal DP pulses Normal sensation to light tough and monofilament Nails thickened

## 2015-12-21 NOTE — Progress Notes (Signed)
Pre visit review using our clinic review tool, if applicable. No additional management support is needed unless otherwise documented below in the visit note. 

## 2015-12-23 NOTE — Assessment & Plan Note (Signed)
Goal A1c around 8 is reasonable. He is not having any low sugars. Goal is to avoid hypoglycemia. Continue work on diet and exercise as tolerated. No change in medications. He agrees. Recheck in about 6 months.

## 2015-12-23 NOTE — Assessment & Plan Note (Signed)
Prev memory testing d/w pt.  He knows the year, month, day.  3/3 attention and recall.  Can do math and read a watch.  Still living independently.  No deficits.   D/w pt about colon cancer screening.  No FH.  Likely that colonoscopy would likely be more risky than beneficial now.  He isn't passing blood by his report.

## 2015-12-23 NOTE — Assessment & Plan Note (Signed)
He had aches previously on statin. He is not enthused about restarting medication. I agree with him. I think it may be likely that medication would make him feel worse. LDL was reasonable. Discussed with him about diet help with triglyceride level. He agrees.

## 2015-12-23 NOTE — Assessment & Plan Note (Signed)
He has taken tramadol previously with relief of his bilateral knee pain. He did not have an adverse effect. It likely is reasonable to continue medication. He is try to put off surgery as long as possible.

## 2015-12-23 NOTE — Assessment & Plan Note (Signed)
Blood pressure is reasonable. No change in medications. No adverse effect on medications. Creatinine is reasonable.

## 2015-12-23 NOTE — Assessment & Plan Note (Signed)
I talked to patient about this. He still does not have a completely clear diagnosis regarding the previous episodes, but he is clearly better in the meantime and I think it makes sense to just follow him clinically. He agrees. He is independent and happy about his current functional status.

## 2015-12-23 NOTE — Assessment & Plan Note (Signed)
B12 within normal limits. Continue replacement. Labs discussed with patient.

## 2015-12-24 ENCOUNTER — Telehealth: Payer: Self-pay

## 2015-12-24 NOTE — Telephone Encounter (Signed)
Caryl Pina RN case mgr with [facility] left v/m requesting most recent  A1C; left v/m 12/17/15 A1c was 8.3.

## 2015-12-27 ENCOUNTER — Other Ambulatory Visit: Payer: Self-pay | Admitting: Family Medicine

## 2016-02-11 HISTORY — PX: MELANOMA EXCISION: SHX5266

## 2016-02-18 ENCOUNTER — Other Ambulatory Visit: Payer: Self-pay | Admitting: Family Medicine

## 2016-02-18 DIAGNOSIS — E538 Deficiency of other specified B group vitamins: Secondary | ICD-10-CM

## 2016-02-20 ENCOUNTER — Other Ambulatory Visit (INDEPENDENT_AMBULATORY_CARE_PROVIDER_SITE_OTHER): Payer: Medicare Other

## 2016-02-20 ENCOUNTER — Other Ambulatory Visit: Payer: Self-pay | Admitting: Family Medicine

## 2016-02-20 DIAGNOSIS — E538 Deficiency of other specified B group vitamins: Secondary | ICD-10-CM

## 2016-02-20 LAB — VITAMIN B12: Vitamin B-12: >1500 pg/mL — ABNORMAL HIGH (ref 211–911)

## 2016-02-20 MED ORDER — CYANOCOBALAMIN 1000 MCG/ML IJ SOLN: 1000.0000 ug | INTRAMUSCULAR | Status: DC

## 2016-02-28 ENCOUNTER — Other Ambulatory Visit: Payer: Self-pay | Admitting: Family Medicine

## 2016-03-20 ENCOUNTER — Emergency Department (HOSPITAL_COMMUNITY): Payer: Medicare Other

## 2016-03-20 ENCOUNTER — Encounter (HOSPITAL_COMMUNITY): Payer: Self-pay

## 2016-03-20 ENCOUNTER — Emergency Department (HOSPITAL_COMMUNITY)
Admission: EM | Admit: 2016-03-20 | Discharge: 2016-03-20 | Disposition: A | Discharge status: Home / Self Care | Payer: Medicare Other | Attending: Physician Assistant | Admitting: Physician Assistant

## 2016-03-20 ENCOUNTER — Telehealth: Payer: Self-pay | Admitting: Family Medicine

## 2016-03-20 DIAGNOSIS — R059 Cough, unspecified: Secondary | ICD-10-CM

## 2016-03-20 DIAGNOSIS — I110 Hypertensive heart disease with heart failure: Secondary | ICD-10-CM | POA: Diagnosis not present

## 2016-03-20 DIAGNOSIS — I5042 Chronic combined systolic (congestive) and diastolic (congestive) heart failure: Secondary | ICD-10-CM | POA: Diagnosis not present

## 2016-03-20 DIAGNOSIS — Z7982 Long term (current) use of aspirin: Secondary | ICD-10-CM | POA: Insufficient documentation

## 2016-03-20 DIAGNOSIS — I252 Old myocardial infarction: Secondary | ICD-10-CM | POA: Insufficient documentation

## 2016-03-20 DIAGNOSIS — E1140 Type 2 diabetes mellitus with diabetic neuropathy, unspecified: Secondary | ICD-10-CM | POA: Diagnosis not present

## 2016-03-20 DIAGNOSIS — Z7984 Long term (current) use of oral hypoglycemic drugs: Secondary | ICD-10-CM | POA: Diagnosis not present

## 2016-03-20 DIAGNOSIS — R0602 Shortness of breath: Secondary | ICD-10-CM | POA: Insufficient documentation

## 2016-03-20 DIAGNOSIS — R05 Cough: Secondary | ICD-10-CM | POA: Diagnosis not present

## 2016-03-20 DIAGNOSIS — I2510 Atherosclerotic heart disease of native coronary artery without angina pectoris: Secondary | ICD-10-CM | POA: Insufficient documentation

## 2016-03-20 DIAGNOSIS — R079 Chest pain, unspecified: Secondary | ICD-10-CM | POA: Diagnosis not present

## 2016-03-20 LAB — CBC WITH DIFFERENTIAL/PLATELET
BASOS ABS: 0 10*3/uL (ref 0.0–0.1)
BASOS PCT: 0 %
Eosinophils Absolute: 0.4 10*3/uL (ref 0.0–0.7)
Eosinophils Relative: 7 %
HEMATOCRIT: 45.7 % (ref 39.0–52.0)
Hemoglobin: 15.7 g/dL (ref 13.0–17.0)
Lymphocytes Relative: 42 %
Lymphs Abs: 2.4 10*3/uL (ref 0.7–4.0)
MCH: 31.6 pg (ref 26.0–34.0)
MCHC: 34.4 g/dL (ref 30.0–36.0)
MCV: 92 fL (ref 78.0–100.0)
MONO ABS: 0.7 10*3/uL (ref 0.1–1.0)
Monocytes Relative: 13 %
NEUTROS ABS: 2.1 10*3/uL (ref 1.7–7.7)
NEUTROS PCT: 38 %
Platelets: 279 10*3/uL (ref 150–400)
RBC: 4.97 MIL/uL (ref 4.22–5.81)
RDW: 13.2 % (ref 11.5–15.5)
WBC: 5.5 10*3/uL (ref 4.0–10.5)

## 2016-03-20 LAB — COMPREHENSIVE METABOLIC PANEL
ALT: 21 U/L (ref 17–63)
ANION GAP: 11 (ref 5–15)
AST: 20 U/L (ref 15–41)
Albumin: 4.5 g/dL (ref 3.5–5.0)
Alkaline Phosphatase: 70 U/L (ref 38–126)
BUN: 18 mg/dL (ref 6–20)
CALCIUM: 9.4 mg/dL (ref 8.9–10.3)
CHLORIDE: 98 mmol/L — AB (ref 101–111)
CO2: 26 mmol/L (ref 22–32)
Creatinine, Ser: 1.04 mg/dL (ref 0.61–1.24)
GFR calc non Af Amer: >60 mL/min (ref >60)
Glucose, Bld: 249 mg/dL — ABNORMAL HIGH (ref 65–99)
Potassium: 3.6 mmol/L (ref 3.5–5.1)
SODIUM: 135 mmol/L (ref 135–145)
Total Bilirubin: 0.6 mg/dL (ref 0.3–1.2)
Total Protein: 7.6 g/dL (ref 6.5–8.1)

## 2016-03-20 LAB — I-STAT TROPONIN, ED: Troponin i, poc: 0.02 ng/mL (ref 0.00–0.08)

## 2016-03-20 LAB — I-STAT CG4 LACTIC ACID, ED: LACTIC ACID, VENOUS: 1.52 mmol/L (ref 0.5–1.9)

## 2016-03-20 MED ORDER — FLUTICASONE PROPIONATE 50 MCG/ACT NA SUSP
2.0000 | Freq: Every day | NASAL | Status: DC
  Administered 2016-03-20: 2 via NASAL
  Filled 2016-03-20: qty 16

## 2016-03-20 MED ORDER — AZITHROMYCIN 250 MG PO TABS
500.0000 mg | ORAL_TABLET | Freq: Once | ORAL | Status: AC
  Administered 2016-03-20: 500 mg via ORAL
  Filled 2016-03-20: qty 2

## 2016-03-20 MED ORDER — AZITHROMYCIN 250 MG PO TABS: 250.0000 mg | ORAL_TABLET | Freq: Once | ORAL | 0 refills | Status: AC

## 2016-03-20 NOTE — Telephone Encounter (Signed)
Patient Name: [name] DOB: 19-Feb-1934 Initial Comment Caller states that he is having trouble breathing. Nurse Assessment Nurse: Ronnald Ramp, RN, Miranda Date/Time (Eastern Time): 03/20/2016 1:22:36 PM Confirm and document reason for call. If symptomatic, describe symptoms. ---Caller states he has congestion and having SOB. Does the patient have any new or worsening symptoms? ---Yes Will a triage be completed? ---Yes Related visit to physician within the last 2 weeks? ---No Does the PT have any chronic conditions? (i.e. diabetes, asthma, etc.) ---Yes List chronic conditions. ---Diabetes, Is this a behavioral health or substance abuse call? ---No Guidelines Guideline Title Affirmed Question Affirmed Notes Common Cold [1] Difficulty breathing AND [2] not severe AND [3] not from stuffy nose (e.g., not relieved by cleaning out the nose) Final Disposition User Go to ED Now (or PCP triage) Ronnald Ramp, RN, Miranda Referrals GO TO FACILITY REFUSED Disagree/Comply: Comply

## 2016-03-20 NOTE — ED Triage Notes (Signed)
Pt states started having some sob last night.  Has runny nose. Denies fever, cough, congestion.  Pt has hx of tremors.  Noted on arrival.  Denies chest pain.  Dizziness.  No n/v

## 2016-03-20 NOTE — Discharge Instructions (Addendum)
Please return with any chest pain fevers or other concerns.

## 2016-03-20 NOTE — Telephone Encounter (Signed)
Pt went to the ED 

## 2016-03-20 NOTE — ED Provider Notes (Signed)
[facility] DEPT Provider Note   CSN: [id number] Arrival date & time: 03/20/16  1446     History   Chief Complaint Chief Complaint  Patient presents with  . Shortness of Breath    HPI DAMEIR LUCHINI is a 82 y.o. male.  HPI   Pt is a 82 year old male presenting with PMH sig for CAD, anxiety and DM presenting with SOB since yesterday. 4 days of congestion. [place] daughter with + flu. He has had SOB, no CP, called PCP who sent him here. No fever. No n/v/d.  No immobilization surgery or other PE risk factors.   Past Medical History:  Diagnosis Date  . Anxiety   . Borderline hyperlipidemia   . CAD (coronary artery disease)   . Colon polyps   . Diverticulosis of colon   . DJD (degenerative joint disease)   . DM (diabetes mellitus) ([place])    Adult onset  . History of gastritis   . History of kidney stones   . History of pyelonephritis   . History of syncope   . HTN (hypertension)   . Hypertrophy of prostate with urinary obstruction and other lower urinary tract symptoms (LUTS)   . Myocardial infarction    MORE THAN 10 YRS AGO - TX'D MEDICALLY - NO STENTS     DR. WALL CARDIOLOGIST  . Myoclonus     Patient Active Problem List   Diagnosis Date Noted  . Vitamin B 12 deficiency 08/17/2015  . Neuropathy in diabetes ([place]) 08/16/2015  . Elevated TSH 10/06/2014  . Bradycardia 10/05/2014  . ARF (acute renal failure) ([place]) 10/05/2014  . Generalized anxiety disorder 10/05/2014  . Gait instability 09/18/2014  . Midline low back pain without sciatica 09/18/2014  . Panic attack 09/18/2014  . Tremor 05/04/2014  . Diarrhea 03/22/2014  . Agitation   . Encephalopathy 01/28/2014  . Chronic combined systolic and diastolic congestive heart failure ([place]) 01/27/2014  . 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 DM type 2 causing renal disease ([place]) 01/27/2014  . Weakness 01/26/2014  . Syncope 01/02/2014  . Involuntary movements 01/02/2014  . History of coronary artery disease   .  Hypokalemia   . Chest pain 12/15/2013  . Dizziness 12/15/2013  . Healthcare maintenance 11/10/2013  . Advance care planning 11/10/2013  . ED (erectile dysfunction) 06/24/2013  . Dyslipidemia 03/22/2012  . Type 2 diabetes mellitus with vascular disease ([place]) 03/22/2012  . Thyroid cyst 03/19/2011  . Swelling, mass, or lump in head and neck 08/15/2010  . HYPERTROPHY PROSTATE W/UR OBST & OTH LUTS 03/15/2010  . GASTRITIS 02/21/2007  . Anxiety state 02/15/2007  . CAD 02/15/2007  . DIVERTICULOSIS OF COLON 02/15/2007  . PYELONEPHRITIS 02/15/2007  . COLONIC POLYPS 02/12/2007  . Essential hypertension 02/12/2007  . RENAL CALCULUS 02/12/2007  . Osteoarthritis 02/12/2007    Past Surgical History:  Procedure Laterality Date  . APPENDECTOMY    . CATARACT EXTRACTION     RIGHT EYE  . CHOLECYSTECTOMY    . cystoscopy  04/17/11   stent placed  . CYSTOSCOPY W/ URETERAL STENT PLACEMENT  04/17/2011   Procedure: CYSTOSCOPY WITH RETROGRADE PYELOGRAM/URETERAL STENT PLACEMENT;  Surgeon: Hanley Ben, MD;  Location: [facility]S;  Service: Urology;  Laterality: Left;  . CYSTOSCOPY WITH RETROGRADE PYELOGRAM, URETEROSCOPY AND STENT PLACEMENT Left 04/18/2013   Procedure: CYSTOSCOPY WITH LEFT RETROGRADE PYELOGRAM, LEFT URETEROSCOPY AND LASER LITHOTRIPSY LEFT STENT PLACEMENT;  Surgeon: Dutch Gray, MD;  Location: [facility];  Service: Urology;  Laterality: Left;  . HOLMIUM LASER APPLICATION  Left 04/18/2013   Procedure: HOLMIUM LASER APPLICATION;  Surgeon: Dutch Gray, MD;  Location: [facility];  Service: Urology;  Laterality: Left;  . IRRIGATION AND DEBRIDEMENT ABSCESS Left 10/05/2014   Procedure: IAND D LEFT INDEX FINGER;  Surgeon: Dayna Barker, MD;  Location: [facility];  Service: Plastics;  Laterality: Left;  . KNEE SURGERY     bilateral ARTHROSCOPY X2 TO EACH KNEE  . S/P cysto & stents for kidnedy stone 11/08 by Dr. Reece Agar    . S/P ELap 1986 w/excision of leiomyoma @ GE junction, Meckle's divertic resected, & cholecystectomy     . SHOULDER SURGERY  04/2005   left by Dr. Berenice Primas       Home Medications    Prior to Admission medications   Medication Sig Start Date End Date Taking? Authorizing Provider  amLODipine (NORVASC) 10 MG tablet TAKE 1 TABLET BY MOUTH  EVERY MORNING 12/27/15   Tonia Ghent, MD  aspirin EC 81 MG tablet Take 81 mg by mouth every evening.     Historical Provider, MD  carvedilol (COREG) 12.5 MG tablet TAKE 1 TABLET BY MOUTH 2  TIMES DAILY WITH A MEAL. 12/28/15   Tonia Ghent, MD  Cinnamon 500 MG capsule Take 500 mg by mouth 3 (three) times daily.    Historical Provider, MD  COD LIVER OIL PO Take 1 capsule by mouth 3 (three) times daily.    Historical Provider, MD  cyanocobalamin (,VITAMIN B-12,) 1000 MCG/ML injection Inject 1 mL (1,000 mcg total) into the muscle every 6 (six) weeks. 02/20/16   Tonia Ghent, MD  glimepiride (AMARYL) 4 MG tablet TAKE ONE-HALF TABLET BY  MOUTH BEFORE BREAKFAST 12/27/15   Tonia Ghent, MD  glucose blood (ONE TOUCH ULTRA TEST) test strip Use as instructed    Historical Provider, MD  HUMALOG KWIKPEN 100 UNIT/ML [place] 3 TIMES DAILY BEFORE MEALS USING SLIDING SCALE. 151 TO 200 = 4 UNITS, 201 TO 250= 6 UNITS, 251 TO 300= 8 UNITS, GREATER THA 03/27/15   Tonia Ghent, MD  hydrALAZINE (APRESOLINE) 50 MG tablet Take 1 tablet by mouth 3  times daily Patient taking differently: Take 1 tablet by mouth 3 times daily 07/19/15   Tonia Ghent, MD  hydrALAZINE (APRESOLINE) 50 MG tablet TAKE 1 TABLET BY MOUTH 3  TIMES DAILY 12/27/15   Tonia Ghent, MD  INS SYRINGE/NEEDLE 1CC/28G (B-D INSULIN SYRINGE 1CC/28G) 28G X 1/2" 1 ML MISC Use monthly with B12 shot. 08/21/15   Tonia Ghent, MD  lisinopril-hydrochlorothiazide (PRINZIDE,ZESTORETIC) 20-25 MG tablet TAKE 1 TABLET BY MOUTH  EVERY MORNING 12/27/15   Tonia Ghent, MD  ONE [facility] ULTRA TEST test strip Check up to 3 times daily  as needed 04/26/15   Tonia Ghent, MD  potassium chloride SA  (K-DUR,KLOR-CON) 20 MEQ tablet Take 1 tablet by mouth  daily 06/15/15   Tonia Ghent, MD  tamsulosin [facility]) 0.4 MG CAPS capsule TAKE 1 CAPSULE BY MOUTH  DAILY AFTER SUPPER 02/29/16   Tonia Ghent, MD  [name] SOLOSTAR 300 UNIT/ML SOPN INJECT 12 UNITS INTO THE SKIN AT BEDTIME. 11/21/15   Tonia Ghent, MD  traMADol (ULTRAM) 50 MG tablet Take 1 tablet (50 mg total) by mouth every 12 (twelve) hours as needed (for knee pain). 12/21/15   Tonia Ghent, MD    Family History Family History  Problem Relation Age of Onset  . Heart attack Mother   . Dementia Brother   .  Colon cancer Neg Hx   . Prostate cancer Neg Hx     Social History Social History  Substance Use Topics  . Smoking status: Never Smoker  . Smokeless tobacco: Never Used  . Alcohol use No     Allergies   Codeine phosphate; Morphine; and Metformin and related   Review of Systems Review of Systems  Constitutional: Negative for activity change, fatigue and fever.  HENT: Positive for congestion.   Respiratory: Positive for cough and shortness of breath.   Cardiovascular: Negative for chest pain.  Gastrointestinal: Negative for abdominal pain.  All other systems reviewed and are negative.    Physical Exam Updated Vital Signs BP 163/78   Pulse 82   Temp 98.1 F (36.7 C) (Oral)   Resp 18   SpO2 96%   Physical Exam  Constitutional: He is oriented to person, place, and time. He appears well-nourished.  HENT:  Head: Normocephalic.  Eyes: Conjunctivae and EOM are normal. Pupils are equal, round, and reactive to light. Right eye exhibits no discharge. Left eye exhibits no discharge.  Cardiovascular: Normal rate and regular rhythm.   No murmur heard. Pulmonary/Chest: Effort normal and breath sounds normal. No respiratory distress. He has no wheezes.  Abdominal: Soft. He exhibits no distension. There is no tenderness.  Musculoskeletal: He exhibits no edema.  Neurological: He is oriented to person, place, and  time.  Skin: Skin is warm and dry. He is not diaphoretic.  Psychiatric: He has a normal mood and affect. His behavior is normal.     ED Treatments / Results  Labs (all labs ordered are listed, but only abnormal results are displayed) Labs Reviewed  COMPREHENSIVE METABOLIC PANEL  CBC WITH DIFFERENTIAL/PLATELET  Randolm Idol, ED  I-STAT CG4 LACTIC ACID, ED    EKG  EKG Interpretation  Date/Time:  Thursday March 20 2016 14:52:40 EST Ventricular Rate:  80 PR Interval:    QRS Duration: 108 QT Interval:  394 QTC Calculation: 455 R Axis:   -20 Text Interpretation:  Sinus rhythm Borderline left axis deviation Normal sinus rhythm Confirmed by Gerald Leitz ([id number]) on 03/20/2016 7:24:04 PM       Radiology Dg Chest 2 View  Result Date: 03/20/2016 CLINICAL DATA:  Shortness of breath with chest pain EXAM: CHEST  2 VIEW COMPARISON:  April 20, 2015 chest radiograph and chest CT January 02, 2014 FINDINGS: There is no edema or consolidation. There is slight apparent scarring in the right lower lobe. There is a 6 mm nodular opacity in the left upper lobe, not appreciable on prior studies. Heart size and pulmonary vascularity are normal. No adenopathy. There is atherosclerotic calcification in the aorta. There is degenerative change in the thoracic spine. There are surgical clips in the upper abdomen. IMPRESSION: 6 mm nodular opacity left upper lobe, not present on prior studies. This finding is felt to warrant noncontrast enhanced chest CT to further assess. Mild scarring right lower lobe. No edema or consolidation. Stable cardiac silhouette. There is aortic atherosclerosis. Electronically Signed   By: Lowella Grip III M.D.   On: 03/20/2016 15:19    Procedures Procedures (including critical care time)  Medications Ordered in ED Medications - No data to display   Initial Impression / Assessment and Plan / ED Course  I have reviewed the triage vital signs and the nursing  notes.  Pertinent labs & imaging results that were available during my care of the patient were reviewed by me and considered in my medical decision making (see  chart for details).     Pt is a 82 year old male presenting with PMH sig for CAD, anxiety and DM presenting with SOB since yesterday. No CP.Patient noted congestion. He feels it is blowing his nose a lot and wanted to go see his primary care. However primary care sent him ere because he was short of breath so they sent him here to emergency department. No PE risk factors. Xray shows L abnormality- will order CT non con.  Normal vitals. No b signs.   CT shows left kidney cyst. Patient is aware of this. We'll have him follow-up with his renal physician about this.  We'll treat with long symptoms of sinusitis. Also will treat atypical pneumonia that may be developing.  Patient is comfortable, ambulatory, and taking PO at time of discharge.  Patient expressed understanding about return precautions.    Final Clinical Impressions(s) / ED Diagnoses   Final diagnoses:  None    New Prescriptions New Prescriptions   No medications on file     Tsuruko Murtha Julio Alm, MD 03/20/16 2124

## 2016-03-21 NOTE — Telephone Encounter (Signed)
Will await ER notes.  Thanks.  

## 2016-03-22 ENCOUNTER — Other Ambulatory Visit: Payer: Self-pay | Admitting: Family Medicine

## 2016-05-11 LAB — HM DIABETES EYE EXAM

## 2016-06-07 ENCOUNTER — Other Ambulatory Visit: Payer: Self-pay | Admitting: Family Medicine

## 2016-06-09 ENCOUNTER — Other Ambulatory Visit: Payer: Self-pay | Admitting: Family Medicine

## 2016-06-09 DIAGNOSIS — E1159 Type 2 diabetes mellitus with other circulatory complications: Secondary | ICD-10-CM

## 2016-06-19 ENCOUNTER — Other Ambulatory Visit (INDEPENDENT_AMBULATORY_CARE_PROVIDER_SITE_OTHER): Payer: Medicare Other

## 2016-06-19 DIAGNOSIS — E1159 Type 2 diabetes mellitus with other circulatory complications: Secondary | ICD-10-CM

## 2016-06-19 LAB — HEMOGLOBIN A1C: Hgb A1c MFr Bld: 8.6 % — ABNORMAL HIGH (ref 4.6–6.5)

## 2016-06-23 ENCOUNTER — Ambulatory Visit (INDEPENDENT_AMBULATORY_CARE_PROVIDER_SITE_OTHER): Payer: Medicare Other | Admitting: Family Medicine

## 2016-06-23 ENCOUNTER — Encounter: Payer: Self-pay | Admitting: Family Medicine

## 2016-06-23 DIAGNOSIS — M170 Bilateral primary osteoarthritis of knee: Secondary | ICD-10-CM

## 2016-06-23 DIAGNOSIS — E1122 Type 2 diabetes mellitus with diabetic chronic kidney disease: Secondary | ICD-10-CM | POA: Diagnosis not present

## 2016-06-23 DIAGNOSIS — L989 Disorder of the skin and subcutaneous tissue, unspecified: Secondary | ICD-10-CM | POA: Insufficient documentation

## 2016-06-23 MED ORDER — TRAMADOL HCL 50 MG PO TABS: 50.0000 mg | ORAL_TABLET | Freq: Two times a day (BID) | ORAL | 2 refills | Status: DC | PRN

## 2016-06-23 NOTE — Assessment & Plan Note (Signed)
Refer. Possible SCC, d/w pt.

## 2016-06-23 NOTE — Progress Notes (Signed)
Pre visit review using our clinic review tool, if applicable. No additional management support is needed unless otherwise documented below in the visit note. 

## 2016-06-23 NOTE — Progress Notes (Signed)
Diabetes:  Using medications without difficulties: yes Hypoglycemic episodes:no Hyperglycemic episodes:no Feet problems:no Blood Sugars averaging: ~200 in the AM.  The lowest reading was 149, later in the afternoon and before dinner.   eye exam within last year: done this year per Dr. Katy Fitch, done on 05/2016.  No retinopathy per patient report.   A1c d/w pt.  Slightly higher than prev.    New skin lesion on the R side of the neck.  Gets irritated, not healing.  Present for months, slowly getting bigger.    Knee pain.  Some relief with tramadol.  Taking it prn, trying to limit doses as much as possible.  No ADE on med.  No sedation.    PMH and SH reviewed  Meds, vitals, and allergies reviewed.   ROS: Per HPI unless specifically indicated in ROS section   GEN: nad, alert and oriented HEENT: mucous membranes moist NECK: supple w/o LA CV: rrr. PULM: ctab, no inc wob ABD: soft, +bs EXT: no edema SKIN: no acute rash but ulcerated lesion on the R side of the neck, 1cm across.   Obvious chronic gross knee joint changes B due to OA  Diabetic foot exam: Normal inspection No skin breakdown No calluses  Normal DP pulses Normal sensation to light touch and monofilament Nails thickened B

## 2016-06-23 NOTE — Assessment & Plan Note (Signed)
Would have patient slowly increase nighttime insulin by 1 unit at a time until morning sugar is below 150.  Recheck in about 3 months, labs ahead of time.  He agrees.  Labs d/w pt.

## 2016-06-23 NOTE — Assessment & Plan Note (Signed)
Continue tramadol prn, no ADE on med, will try to limit nsaids at this point.  Okay for outpatient f/u.  He agrees.  >25 minutes spent in face to face time with patient, >50% spent in counselling or coordination of care.

## 2016-06-23 NOTE — Patient Instructions (Signed)
[name] will call about your referral.  See her on the way out.  Ask about seeing Dr. Allyson Sabal.  I would slowly increase your nighttime insulin by 1 unit at a time until your morning sugar is below 150.  Take care.  Glad to see you.  Recheck in about 3 months, labs ahead of time.

## 2016-06-26 DIAGNOSIS — D225 Melanocytic nevi of trunk: Secondary | ICD-10-CM | POA: Diagnosis not present

## 2016-06-26 DIAGNOSIS — D034 Melanoma in situ of scalp and neck: Secondary | ICD-10-CM | POA: Diagnosis not present

## 2016-06-26 DIAGNOSIS — L821 Other seborrheic keratosis: Secondary | ICD-10-CM | POA: Diagnosis not present

## 2016-06-26 DIAGNOSIS — C4442 Squamous cell carcinoma of skin of scalp and neck: Secondary | ICD-10-CM | POA: Diagnosis not present

## 2016-06-26 DIAGNOSIS — D1801 Hemangioma of skin and subcutaneous tissue: Secondary | ICD-10-CM | POA: Diagnosis not present

## 2016-06-26 DIAGNOSIS — D485 Neoplasm of uncertain behavior of skin: Secondary | ICD-10-CM | POA: Diagnosis not present

## 2016-07-01 ENCOUNTER — Telehealth: Payer: Self-pay | Admitting: Family Medicine

## 2016-07-01 NOTE — Telephone Encounter (Signed)
Left message asking pt daughter Caryl Asp to call the office Have questions about FMLA   Need start date

## 2016-07-01 NOTE — Telephone Encounter (Signed)
Start date 5/17

## 2016-07-02 NOTE — Telephone Encounter (Signed)
I'll work on the hard copy.  Thanks.  

## 2016-07-02 NOTE — Telephone Encounter (Signed)
Paperwork in dr [employer] in box For review and signature

## 2016-07-08 NOTE — Telephone Encounter (Signed)
Paperwork faxed Copy for file  Copy for scan Copy for pt  Left message letting joy know paperwork faxed

## 2016-07-28 ENCOUNTER — Emergency Department (HOSPITAL_COMMUNITY)
Admission: EM | Admit: 2016-07-28 | Discharge: 2016-07-28 | Disposition: A | Discharge status: Home / Self Care | Payer: Medicare Other | Attending: Emergency Medicine | Admitting: Emergency Medicine

## 2016-07-28 ENCOUNTER — Encounter (HOSPITAL_COMMUNITY): Payer: Self-pay | Admitting: *Deleted

## 2016-07-28 DIAGNOSIS — I110 Hypertensive heart disease with heart failure: Secondary | ICD-10-CM | POA: Diagnosis not present

## 2016-07-28 DIAGNOSIS — I2510 Atherosclerotic heart disease of native coronary artery without angina pectoris: Secondary | ICD-10-CM | POA: Diagnosis not present

## 2016-07-28 DIAGNOSIS — E1140 Type 2 diabetes mellitus with diabetic neuropathy, unspecified: Secondary | ICD-10-CM | POA: Diagnosis not present

## 2016-07-28 DIAGNOSIS — H9201 Otalgia, right ear: Secondary | ICD-10-CM | POA: Diagnosis not present

## 2016-07-28 DIAGNOSIS — I5040 Unspecified combined systolic (congestive) and diastolic (congestive) heart failure: Secondary | ICD-10-CM | POA: Insufficient documentation

## 2016-07-28 DIAGNOSIS — I10 Essential (primary) hypertension: Secondary | ICD-10-CM | POA: Diagnosis not present

## 2016-07-28 DIAGNOSIS — Z7982 Long term (current) use of aspirin: Secondary | ICD-10-CM | POA: Diagnosis not present

## 2016-07-28 DIAGNOSIS — Z79899 Other long term (current) drug therapy: Secondary | ICD-10-CM | POA: Diagnosis not present

## 2016-07-28 DIAGNOSIS — I252 Old myocardial infarction: Secondary | ICD-10-CM | POA: Diagnosis not present

## 2016-07-28 MED ORDER — NEOMYCIN-POLYMYXIN-HC 3.5-10000-1 OP SUSP: OPHTHALMIC | 0 refills | Status: DC

## 2016-07-28 NOTE — ED Provider Notes (Signed)
[facility] DEPT Provider Note   CSN: [id number] Arrival date & time: 07/28/16  1820  By signing my name below, I, [name], attest that this documentation has been prepared under the direction and in the presence of [name], PA-C. Electronically Signed: Levester Mathews, Scribe. 07/28/2016. 7:10 PM.  History   Chief Complaint Chief Complaint  Patient presents with  . Foreign Body in [place] [name] is a 82 y.o. male with a PMHx significant for CAD, anxiety and DM, who presents to the Emergency Department with complaints of right ear discomfort and foreign body sensation x2 hours.  Pt was preparing to take a shower, when he attempted to remove his hearing aid.  After he pulled it out, he noticed that the plastic end cap was missing.  He subsequently scratched his ear attempting to remove the object. His sister used tweezers to try to pull the object out, without success. He visited an Urgent Care before this, who didn't find a foreign body on exam. They advised him to come to the ED for further work-up.  The history is provided by the patient, medical records and a significant other. No language interpreter was used.   Past Medical History:  Diagnosis Date  . Anxiety   . Borderline hyperlipidemia   . CAD (coronary artery disease)   . Colon polyps   . Diverticulosis of colon   . DJD (degenerative joint disease)   . DM (diabetes mellitus) ([place])    Adult onset  . History of gastritis   . History of kidney stones   . History of pyelonephritis   . History of syncope   . HTN (hypertension)   . Hypertrophy of prostate with urinary obstruction and other lower urinary tract symptoms (LUTS)   . Myocardial infarction ([place])    MORE THAN 10 YRS AGO - TX'D MEDICALLY - NO STENTS     DR. WALL CARDIOLOGIST  . Myoclonus     Patient Active Problem List   Diagnosis Date Noted  . Skin lesion 06/23/2016  . Vitamin B 12 deficiency 08/17/2015  . Neuropathy  in diabetes ([place]) 08/16/2015  . Elevated TSH 10/06/2014  . Bradycardia 10/05/2014  . ARF (acute renal failure) ([place]) 10/05/2014  . Generalized anxiety disorder 10/05/2014  . Gait instability 09/18/2014  . Midline low back pain without sciatica 09/18/2014  . Panic attack 09/18/2014  . Tremor 05/04/2014  . Diarrhea 03/22/2014  . Agitation   . Encephalopathy 01/28/2014  . Chronic combined systolic and diastolic congestive heart failure ([place]) 01/27/2014  . 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 DM type 2 causing renal disease ([place]) 01/27/2014  . Weakness 01/26/2014  . Syncope 01/02/2014  . Involuntary movements 01/02/2014  . History of coronary artery disease   . Hypokalemia   . Chest pain 12/15/2013  . Dizziness 12/15/2013  . Healthcare maintenance 11/10/2013  . Advance care planning 11/10/2013  . ED (erectile dysfunction) 06/24/2013  . Dyslipidemia 03/22/2012  . Type 2 diabetes mellitus with vascular disease ([place]) 03/22/2012  . Thyroid cyst 03/19/2011  . Swelling, mass, or lump in head and neck 08/15/2010  . HYPERTROPHY PROSTATE W/UR OBST & OTH LUTS 03/15/2010  . GASTRITIS 02/21/2007  . Anxiety state 02/15/2007  . CAD 02/15/2007  . DIVERTICULOSIS OF COLON 02/15/2007  . PYELONEPHRITIS 02/15/2007  . COLONIC POLYPS 02/12/2007  . Essential hypertension 02/12/2007  . RENAL CALCULUS 02/12/2007  . Osteoarthritis 02/12/2007    Past Surgical History:  Procedure Laterality Date  .  APPENDECTOMY    . CATARACT EXTRACTION     RIGHT EYE  . CHOLECYSTECTOMY    . cystoscopy  04/17/11   stent placed  . CYSTOSCOPY W/ URETERAL STENT PLACEMENT  04/17/2011   Procedure: CYSTOSCOPY WITH RETROGRADE PYELOGRAM/URETERAL STENT PLACEMENT;  Surgeon: Hanley Ben, MD;  Location: [facility];  Service: Urology;  Laterality: Left;  . CYSTOSCOPY WITH RETROGRADE PYELOGRAM, URETEROSCOPY AND STENT PLACEMENT Left 04/18/2013   Procedure: CYSTOSCOPY WITH LEFT RETROGRADE PYELOGRAM, LEFT URETEROSCOPY AND LASER LITHOTRIPSY  LEFT STENT PLACEMENT;  Surgeon: Dutch Gray, MD;  Location: [facility];  Service: Urology;  Laterality: Left;  . HOLMIUM LASER APPLICATION Left 0/02/7492   Procedure: HOLMIUM LASER APPLICATION;  Surgeon: Dutch Gray, MD;  Location: [facility];  Service: Urology;  Laterality: Left;  . IRRIGATION AND DEBRIDEMENT ABSCESS Left 10/05/2014   Procedure: IAND D LEFT INDEX FINGER;  Surgeon: Dayna Barker, MD;  Location: [facility];  Service: Plastics;  Laterality: Left;  . KNEE SURGERY     bilateral ARTHROSCOPY X2 TO EACH KNEE  . S/P cysto & stents for kidnedy stone 11/08 by Dr. Reece Agar    . 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 SHOULDER SURGERY  04/2005   left by Dr. Berenice Primas       Home Medications    Prior to Admission medications   Medication Sig Start Date End Date Taking? Authorizing Provider  amLODipine (NORVASC) 10 MG tablet TAKE 1 TABLET BY MOUTH  EVERY MORNING 12/27/15   Tonia Ghent, MD  aspirin EC 81 MG tablet Take 81 mg by mouth every evening.     [provider]  carvedilol (COREG) 12.5 MG tablet TAKE 1 TABLET BY MOUTH 2  TIMES DAILY WITH A MEAL. 12/28/15   Tonia Ghent, MD  Cinnamon 500 MG capsule Take 500 mg by mouth 3 (three) times daily.    [provider]  COD LIVER OIL PO Take 1 capsule by mouth 3 (three) times daily.    [provider]  cyanocobalamin (,VITAMIN B-12,) 1000 MCG/ML injection Inject 1 mL (1,000 mcg total) into the muscle every 6 (six) weeks. 02/20/16   Tonia Ghent, MD  glimepiride (AMARYL) 4 MG tablet TAKE ONE-HALF TABLET BY  MOUTH BEFORE BREAKFAST 03/24/16   Tonia Ghent, MD  glucose blood (ONE TOUCH ULTRA TEST) test strip Use as instructed    [provider]  HUMALOG KWIKPEN 100 UNIT/ML KiwkPen INJECT INTO SKIN 3 TIMES DAILY BEFORE MEALS USING SLIDING SCALE. 151 TO 200 = 4 UNITS, 201 TO 250= 6 UNITS, 251 TO 300= 8 UNITS, GREATER THA 06/09/16   Tonia Ghent, MD   hydrALAZINE (APRESOLINE) 50 MG tablet Take 1 tablet by mouth 3  times daily 07/19/15   Tonia Ghent, MD  INS SYRINGE/NEEDLE 1CC/28G (B-D INSULIN SYRINGE 1CC/28G) 28G X 1/2" 1 ML MISC Use monthly with B12 shot. 08/21/15   Tonia Ghent, MD  lisinopril-hydrochlorothiazide (PRINZIDE,ZESTORETIC) 20-25 MG tablet TAKE 1 TABLET BY MOUTH  EVERY MORNING 03/24/16   Tonia Ghent, MD  neomycin-polymyxin-hydrocortisone (CORTISPORIN) 3.5-10000-1 ophthalmic suspension Put 2 drops in your right ear 4 times a day for the next 5 days 07/28/16   Sitlaly Gudiel, PA-C  ONE TOUCH ULTRA TEST test strip Check up to 3 times daily  as needed 04/26/15   Tonia Ghent, MD  potassium chloride SA (K-DUR,KLOR-CON) 20 MEQ tablet TAKE 1 TABLET BY MOUTH  DAILY 03/24/16  Tonia Ghent, MD  tamsulosin [facility]) 0.4 MG CAPS capsule TAKE 1 CAPSULE BY MOUTH  DAILY AFTER SUPPER 02/29/16   Tonia Ghent, MD  [name] SOLOSTAR 300 UNIT/ML SOPN INJECT 12 UNITS INTO THE SKIN AT BEDTIME. 11/21/15   Tonia Ghent, MD  traMADol (ULTRAM) 50 MG tablet Take 1 tablet (50 mg total) by mouth every 12 (twelve) hours as needed (for knee pain). 06/23/16   Tonia Ghent, MD    Family History Family History  Problem Relation Age of Onset  . Heart attack Mother   . Dementia Brother   . Colon cancer Neg Hx   . Prostate cancer Neg Hx     Social History Social History  Substance Use Topics  . Smoking status: Never Smoker  . Smokeless tobacco: Never Used  . Alcohol use No     Allergies   Codeine phosphate; Morphine; and Metformin and related   Review of Systems Review of Systems  HENT: Negative for ear discharge, ear pain and hearing loss.        R ear irritation  Gastrointestinal: Negative for nausea and vomiting.  Skin: Negative for wound.  Neurological: Negative for dizziness.     Physical Exam Updated Vital Signs BP (!) 171/83 (BP Location: Left Arm)   Pulse 91   Temp 98.9 F (37.2 C) (Oral)   Resp 18    SpO2 98%   Physical Exam  Constitutional: He is oriented to person, place, and time. He appears well-developed and well-nourished. No distress.  HENT:  Head: Normocephalic and atraumatic.  Right Ear: Hearing, tympanic membrane and external ear normal.  Left Ear: Hearing, tympanic membrane, external ear and ear canal normal.  Nose: Nose normal.  Mouth/Throat: Uvula is midline, oropharynx is clear and moist and mucous membranes are normal.  No foreign body visualized in right ear canal.  Scant amount of dried blood from scratch on ear canal.  Eyes: EOM are normal. Pupils are equal, round, and reactive to light.  Neck: Normal range of motion. Neck supple.  Cardiovascular: Normal rate and regular rhythm.   Pulmonary/Chest: Effort normal.  Musculoskeletal: Normal range of motion. He exhibits no edema or deformity.  Neurological: He is alert and oriented to person, place, and time.  Skin: Skin is warm and dry.  Psychiatric: He has a normal mood and affect.  Nursing note and vitals reviewed.   ED Treatments / Results  DIAGNOSTIC STUDIES: Oxygen Saturation is 97% on room air, adequate by my interpretation.    COORDINATION OF CARE: 7:08 PM Discussed treatment plan with pt at bedside and pt agreed to plan.  Labs (all labs ordered are listed, but only abnormal results are displayed) Labs Reviewed - No data to display  EKG  EKG Interpretation None       Radiology No results found.  Procedures .Ear Cerumen Removal Date/Time: 07/28/2016 7:15 AM Performed by: Franchot Heidelberg Authorized by: Franchot Heidelberg   Consent:    Consent obtained:  Verbal   Consent given by:  Patient   Risks discussed:  Dizziness, bleeding and pain   Alternatives discussed:  No treatment Procedure details:    Location:  R ear   Procedure type: irrigation   Post-procedure details:    Inspection:  TM intact   Hearing quality:  Normal   Patient tolerance of procedure:  Tolerated well, no immediate  complications Comments:     No foreign object was removed from the ear. 250 mL irrigated into the ear. Postprocedure, no  foreign object was noted in the canal. Patient had laceration and irritation of ear canal pre and post procedure.  Pt tolerated procedure well   (including critical care time)  Medications Ordered in ED Medications - No data to display   Initial Impression / Assessment and Plan / ED Course  I have reviewed the triage vital signs and the nursing notes.  Pertinent labs & imaging results that were available during my care of the patient were reviewed by me and considered in my medical decision making (see chart for details).     Patient without visible foreign object in ear. Patient stated adamantly that he knew it was in there. Ear was irrigated with warm sterile saline, and no foreign object was visualized after procedure, or in the basin. Patient had minor laceration to right ear canal and obvious irritation/erythema of the ear canal (prior to procedure). Patient states he is not on any immunosuppressants or steroids, but does have diabetes. I will prescribe cortisporin eardrops for prophylactic measures. As opthalmic soln is on formulary, will rx eye drops in lieu of ear drops. Return precautions given. Patient agrees to plan.  Final Clinical Impressions(s) / ED Diagnoses   Final diagnoses:  Right ear pain   I personally performed the services described in this documentation, which was scribed in my presence. The recorded information has been reviewed and is accurate.   New Prescriptions Discharge Medication List as of 07/28/2016  7:22 PM    START taking these medications   Details  neomycin-polymyxin-hydrocortisone (CORTISPORIN) 3.5-10000-1 ophthalmic suspension Put 2 drops in your right ear 4 times a day for the next 5 days, Print         Franchot Heidelberg, PA-C 07/28/16 Margarita Sermons, MD 07/28/16 2037

## 2016-07-28 NOTE — Discharge Instructions (Signed)
Use the ear drops as instructed. Try not to pick or scratch your ear, as this could cause further damage. Return to the ED if you develop fever, chills, worsening ear pain, or hearing changes.

## 2016-07-28 NOTE — ED Notes (Signed)
Pt reports the end piece of his hearing aid is stuck in his R ear, none noted.  But pt reports feeling it in his R ear.  Dried blood noted on the outer part of his R ear.

## 2016-07-28 NOTE — ED Notes (Signed)
Raquel Sarna, PA-C informed of pt's b/p.  No new orders received.

## 2016-07-31 ENCOUNTER — Other Ambulatory Visit: Payer: Self-pay | Admitting: Family Medicine

## 2016-08-05 ENCOUNTER — Ambulatory Visit (INDEPENDENT_AMBULATORY_CARE_PROVIDER_SITE_OTHER): Payer: Medicare Other | Admitting: Family Medicine

## 2016-08-05 ENCOUNTER — Encounter: Payer: Self-pay | Admitting: Family Medicine

## 2016-08-05 DIAGNOSIS — R42 Dizziness and giddiness: Secondary | ICD-10-CM | POA: Diagnosis not present

## 2016-08-05 MED ORDER — MECLIZINE HCL 25 MG PO TABS: 12.5000 mg | ORAL_TABLET | Freq: Two times a day (BID) | ORAL | Status: DC | PRN

## 2016-08-05 NOTE — Patient Instructions (Signed)
Your ear canal looks good.   Try taking meclizine 12.5-25mg  up to twice a day if needed for dizziness and update me as needed.  You can get it over the counter.  Take care.  Glad to see you.

## 2016-08-05 NOTE — Progress Notes (Signed)
ER follow-up. ER course discussed with patient. Right ear abrasion previously noted. R ear is not painful now.  He has a new hearing aid and he likely scratching the canal with that.  Done with 5 days of ear drops.    Dizzy.  Noted most of the time recently.  Has been going on for about 2 days.  Not lightheaded.  He has sensation of room spinning. It spins from his right and moves anteriorly to his left.  When laying down he seems to do better.  Worse when walking.  Not worse rolling over in the bed.    R eye recently matted but no eye pain.  No eye redness.  Unclear if irritation was from seasonal allergies.    No FCNAVD.  No ST but some clear rhinorrhea.   No CP, SOB.  Still on baseline meds o/w.   He had h/o vertigo years ago.    Meds, vitals, and allergies reviewed.   ROS: Per HPI unless specifically indicated in ROS section   nad ncat Canals and TMs wnl but B ETD noted.  Nasal exam slightly stuffy Conjunctiva within normal limits bilaterally. Not injected. Lids within normal limits bilaterally. OP wnl Neck supple, no LA rrr ctab Dix-Hallpike is negative but he does have return of vertigo symptoms when laying flat on his back and he turns his head from the left to the right. He clearly gets return of vertigo symptoms at that point. He is able stand from a sitting position without getting lightheaded.

## 2016-08-06 NOTE — Assessment & Plan Note (Signed)
Not orthostatic. He does have reproducible symptoms when laying on his back and moving his head from the left of the right. The symptoms resolve a few moments later. Likely benign positional vertigo. His previous urine issue with the abrasion in the canal has apparently resolved. No residual problems seen on inspection. He did have some eye matting recently but this appears to be resolved. At this point okay for outpatient follow-up. Reasonable to try meclizine with routine cautions and then update me as needed. He agrees.

## 2016-08-20 ENCOUNTER — Other Ambulatory Visit: Payer: Self-pay | Admitting: Family Medicine

## 2016-08-20 DIAGNOSIS — D034 Melanoma in situ of scalp and neck: Secondary | ICD-10-CM | POA: Diagnosis not present

## 2016-08-20 DIAGNOSIS — L905 Scar conditions and fibrosis of skin: Secondary | ICD-10-CM | POA: Diagnosis not present

## 2016-08-20 DIAGNOSIS — D485 Neoplasm of uncertain behavior of skin: Secondary | ICD-10-CM | POA: Diagnosis not present

## 2016-08-21 ENCOUNTER — Other Ambulatory Visit: Payer: Self-pay | Admitting: Family Medicine

## 2016-08-22 IMAGING — DX DG LUMBAR SPINE COMPLETE 4+V
5 series · 5 of 5 positions shown · non-contrast
Comparison: None.

CLINICAL DATA: Back pain, seizure activity, history of old back
fracture.

EXAM:
LUMBAR SPINE - COMPLETE 4+ VIEW

[l-spine ap]
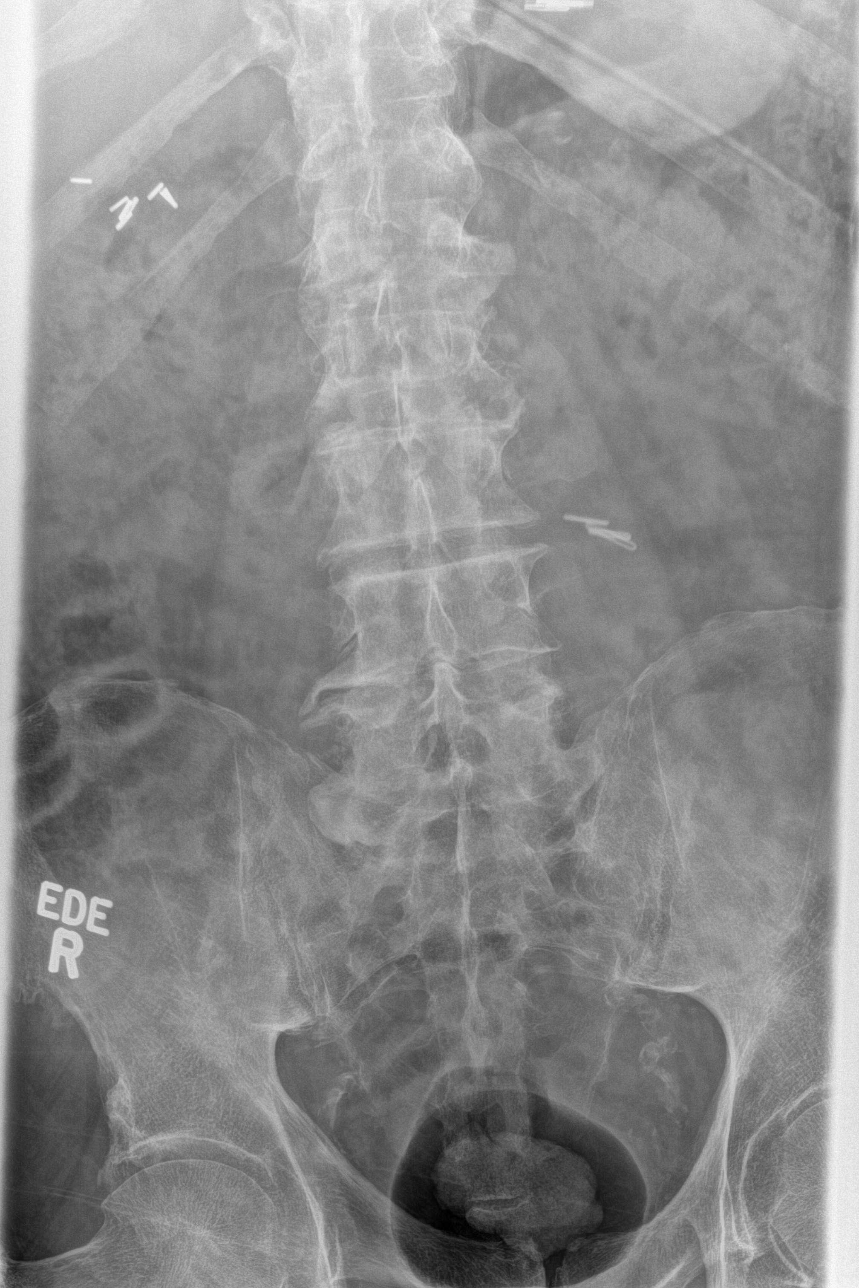

[l-spine obl (1 of 2)]
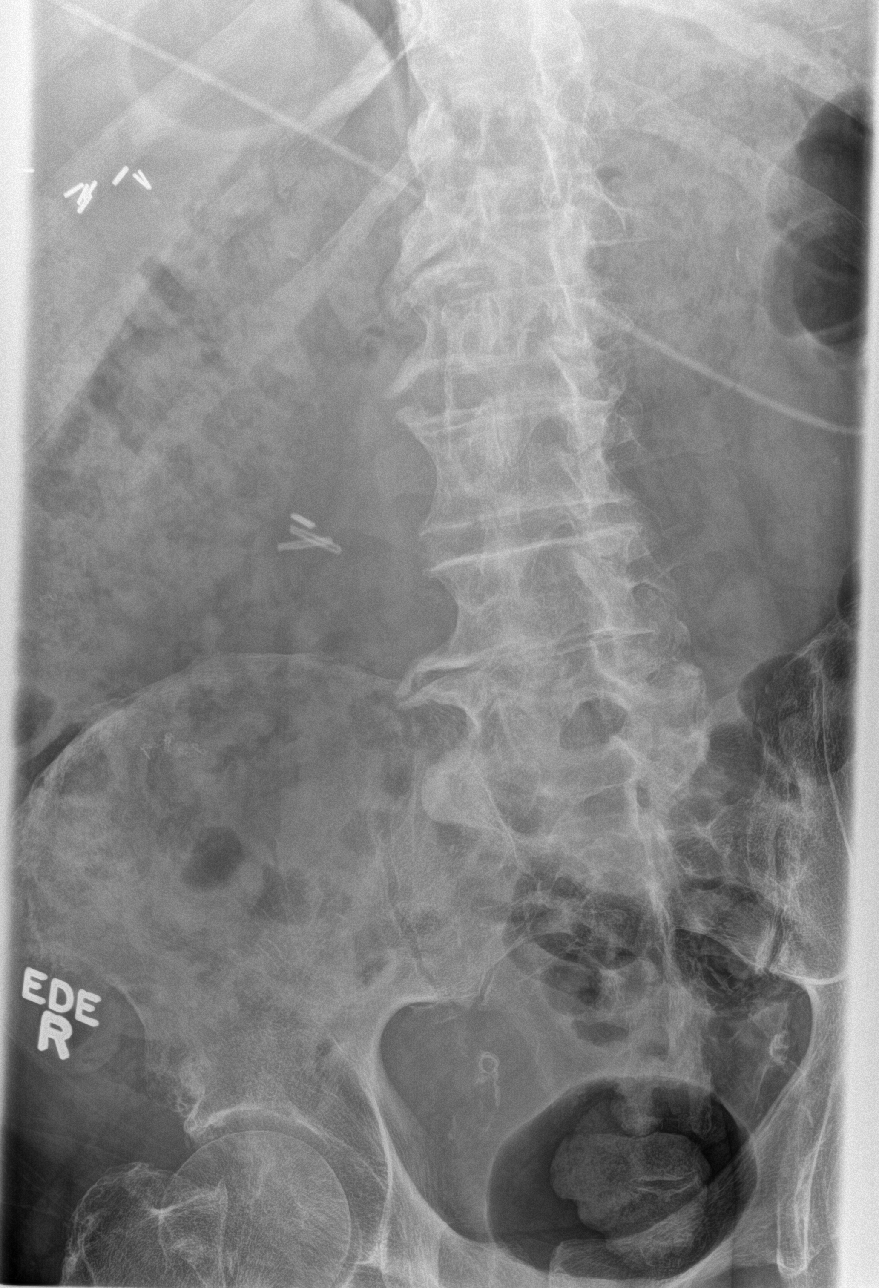

[l-spine obl (2 of 2)]
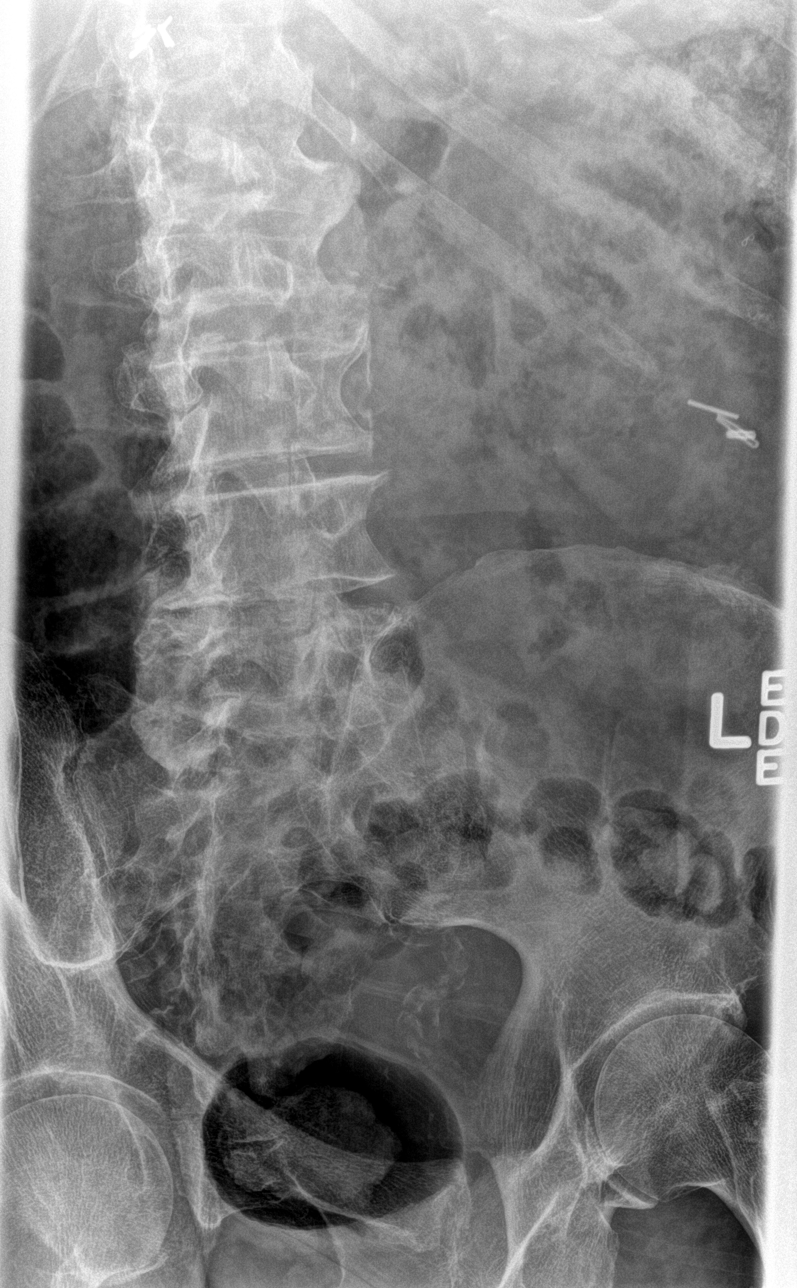

[l-spine lat]
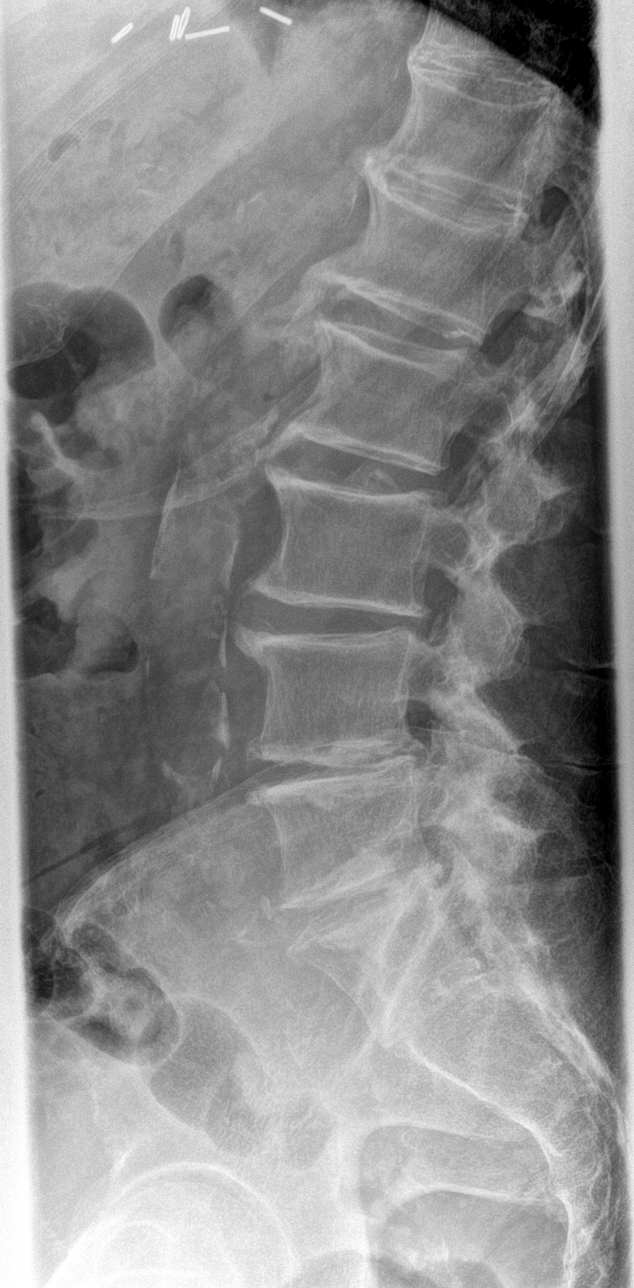

[l-spine spot]
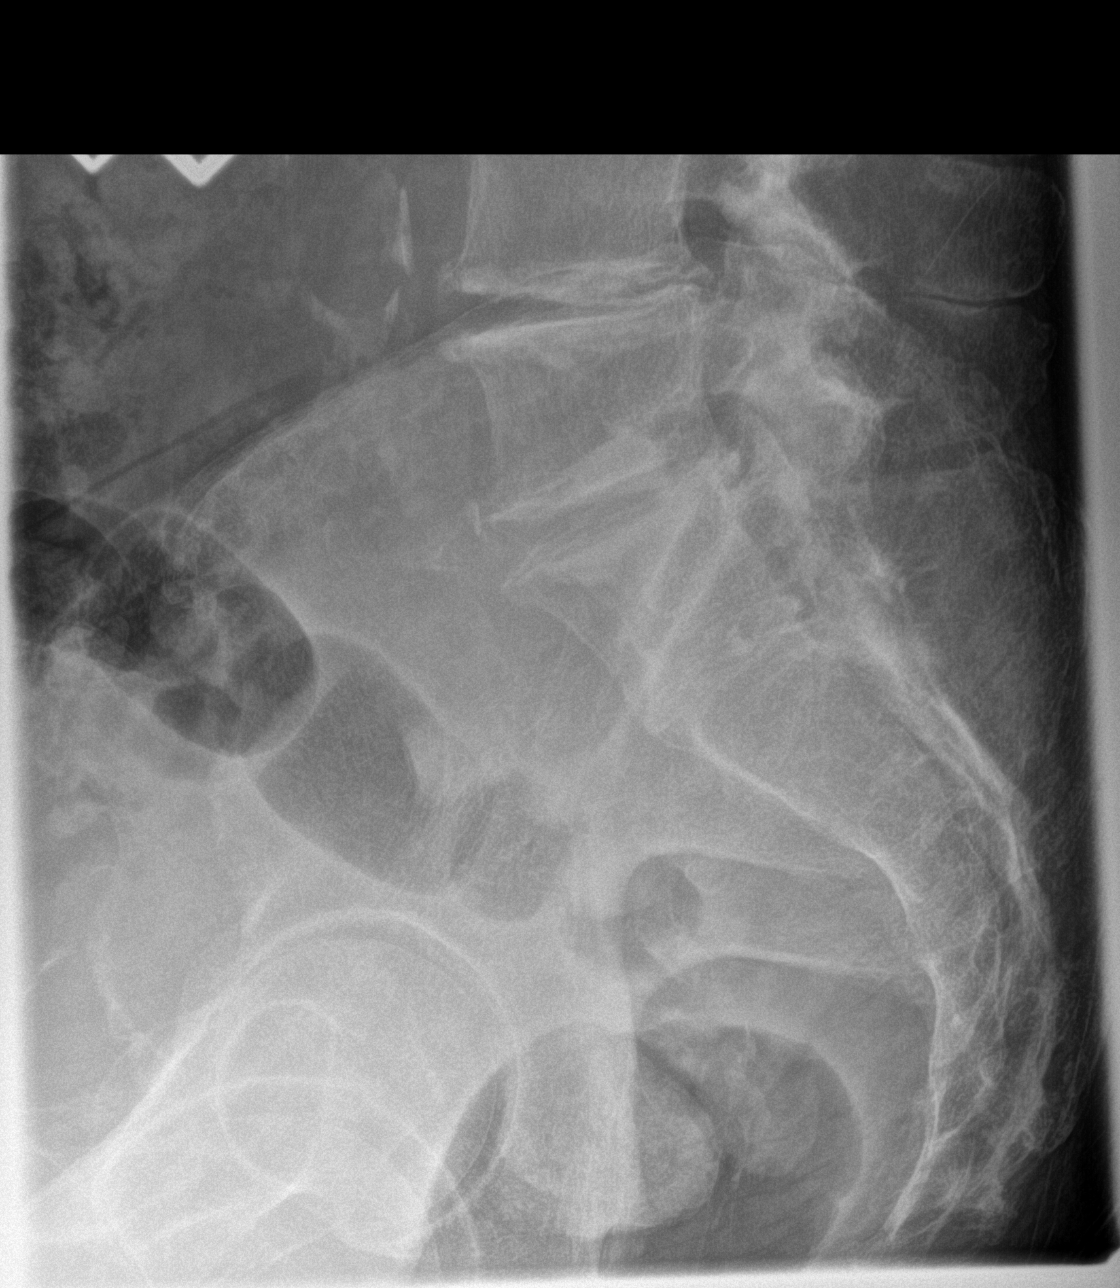

[5 of 5 positions shown; findings below may reference images not displayed]

FINDINGS: The lumbar vertebral bodies are preserved in height. There is disc
space narrowing at L4-5 and at L5-S1. S1 is transitional. The
pedicles and transverse processes are intact where visualized. There
are prominent anterior endplate osteophytes at T12-L1, L1-L2, L2-L3,
and L3-L4. There is facet joint hypertrophy in the mid and lower
lumbar spine. There is no spondylolisthesis. There is calcification
in the wall of the abdominal aorta.
IMPRESSION: There is multilevel degenerative disc and facet joint change of the
lumbar spine. There is no compression fracture nor other acute bony
abnormality.

## 2016-09-04 ENCOUNTER — Other Ambulatory Visit: Payer: Self-pay | Admitting: Family Medicine

## 2016-09-09 DIAGNOSIS — M1711 Unilateral primary osteoarthritis, right knee: Secondary | ICD-10-CM | POA: Diagnosis not present

## 2016-09-09 DIAGNOSIS — M1712 Unilateral primary osteoarthritis, left knee: Secondary | ICD-10-CM | POA: Diagnosis not present

## 2016-11-13 ENCOUNTER — Other Ambulatory Visit: Payer: Self-pay | Admitting: Family Medicine

## 2016-12-16 ENCOUNTER — Telehealth: Payer: Self-pay

## 2016-12-16 NOTE — Telephone Encounter (Signed)
Left appointment info on home phone. Advised patient to fast for labs.

## 2016-12-17 ENCOUNTER — Ambulatory Visit (INDEPENDENT_AMBULATORY_CARE_PROVIDER_SITE_OTHER): Payer: Medicare Other

## 2016-12-17 ENCOUNTER — Other Ambulatory Visit: Payer: Self-pay | Admitting: Family Medicine

## 2016-12-17 VITALS — BP 140/86 | HR 61 | Temp 97.6°F | Ht 68.5 in | Wt 228.5 lb

## 2016-12-17 DIAGNOSIS — E538 Deficiency of other specified B group vitamins: Secondary | ICD-10-CM

## 2016-12-17 DIAGNOSIS — Z23 Encounter for immunization: Secondary | ICD-10-CM | POA: Diagnosis not present

## 2016-12-17 DIAGNOSIS — E1122 Type 2 diabetes mellitus with diabetic chronic kidney disease: Secondary | ICD-10-CM

## 2016-12-17 DIAGNOSIS — Z0000 Encounter for general adult medical examination without abnormal findings: Secondary | ICD-10-CM | POA: Diagnosis not present

## 2016-12-17 LAB — VITAMIN B12: Vitamin B-12: 635 pg/mL (ref 211–911)

## 2016-12-17 LAB — LIPID PANEL
CHOL/HDL RATIO: 5
Cholesterol: 156 mg/dL (ref 0–200)
HDL: 34.5 mg/dL — AB (ref >39.00)
NONHDL: 121.94
TRIGLYCERIDES: 202 mg/dL — AB (ref 0.0–149.0)
VLDL: 40.4 mg/dL — AB (ref 0.0–40.0)

## 2016-12-17 LAB — COMPREHENSIVE METABOLIC PANEL
ALT: 19 U/L (ref 0–53)
AST: 14 U/L (ref 0–37)
Albumin: 4.3 g/dL (ref 3.5–5.2)
Alkaline Phosphatase: 56 U/L (ref 39–117)
BUN: 17 mg/dL (ref 6–23)
CHLORIDE: 103 meq/L (ref 96–112)
CO2: 31 meq/L (ref 19–32)
Calcium: 10 mg/dL (ref 8.4–10.5)
Creatinine, Ser: 1.11 mg/dL (ref 0.40–1.50)
GFR: 67.75 mL/min (ref >60.00)
GLUCOSE: 107 mg/dL — AB (ref 70–99)
POTASSIUM: 3.7 meq/L (ref 3.5–5.1)
Sodium: 142 mEq/L (ref 135–145)
Total Bilirubin: 0.5 mg/dL (ref 0.2–1.2)
Total Protein: 7 g/dL (ref 6.0–8.3)

## 2016-12-17 LAB — HEMOGLOBIN A1C: HEMOGLOBIN A1C: 8.2 % — AB (ref 4.6–6.5)

## 2016-12-17 LAB — LDL CHOLESTEROL, DIRECT: Direct LDL: 88 mg/dL

## 2016-12-17 LAB — TSH: TSH: 4.56 u[IU]/mL — ABNORMAL HIGH (ref 0.35–4.50)

## 2016-12-17 NOTE — Progress Notes (Signed)
Pre visit review using our clinic review tool, if applicable. No additional management support is needed unless otherwise documented below in the visit note. 

## 2016-12-17 NOTE — Progress Notes (Signed)
PCP notes:   Health maintenance:  Flu vaccine - administered  Abnormal screenings:   Mini-Cog score: 19/20  Patient concerns:   None  Nurse concerns:  None  Next PCP appt:   12/23/16 @ 0945

## 2016-12-17 NOTE — Progress Notes (Signed)
Subjective:   [name] is a 82 y.o. male who presents for Medicare Annual/Subsequent preventive examination.  Review of Systems:  N/A Cardiac Risk Factors include: advanced age (>64men, >87 women);obesity (BMI >30kg/m2);diabetes mellitus;male gender;hypertension     Objective:    Vitals: BP 140/86 (BP Location: Right Arm, Patient Position: Sitting, Cuff Size: Normal)   Pulse 61   Temp 97.6 F (36.4 C) (Oral)   Ht 5' 8.5" (1.74 m) Comment: no shoes  Wt 228 lb 8 oz (103.6 kg)   SpO2 94%   BMI 34.24 kg/m   Body mass index is 34.24 kg/m.  Tobacco Social History   Tobacco Use  Smoking Status Never Smoker  Smokeless Tobacco Never Used     Counseling given: No   Past Medical History:  Diagnosis Date  . Anxiety   . Borderline hyperlipidemia   . CAD (coronary artery disease)   . Colon polyps   . Diverticulosis of colon   . DJD (degenerative joint disease)   . DM (diabetes mellitus) ([place])    Adult onset  . History of gastritis   . History of kidney stones   . History of pyelonephritis   . History of syncope   . HTN (hypertension)   . Hypertrophy of prostate with urinary obstruction and other lower urinary tract symptoms (LUTS)   . Myocardial infarction ([place])    MORE THAN 10 YRS AGO - TX'D MEDICALLY - NO STENTS     DR. WALL CARDIOLOGIST  . Myoclonus    Past Surgical History:  Procedure Laterality Date  . APPENDECTOMY    . CATARACT EXTRACTION     RIGHT EYE  . CHOLECYSTECTOMY    . cystoscopy  04/17/11   stent placed  . KNEE SURGERY     bilateral ARTHROSCOPY X2 TO EACH KNEE  . MELANOMA EXCISION  2018  . S/P cysto & stents for kidnedy stone 11/08 by Dr. Reece Agar    . 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 SHOULDER SURGERY  04/2005   left by Dr. Berenice Primas   Family History  Problem Relation Age of Onset  . Heart attack Mother   . Dementia Brother   . Colon cancer Neg Hx   . Prostate cancer  Neg Hx    Social History   Substance and Sexual Activity  Sexual Activity No    Outpatient Encounter Medications as of 12/17/2016  Medication Sig  . amLODipine (NORVASC) 10 MG tablet TAKE 1 TABLET BY MOUTH  EVERY MORNING  . aspirin EC 81 MG tablet Take 81 mg by mouth every evening.   . carvedilol (COREG) 12.5 MG tablet TAKE 1 TABLET BY MOUTH 2  TIMES DAILY WITH MEALS  . Cinnamon 500 MG capsule Take 500 mg by mouth 3 (three) times daily.  . COD LIVER OIL PO Take 1 capsule by mouth 3 (three) times daily.  Marland Kitchen glimepiride (AMARYL) 4 MG tablet TAKE ONE-HALF TABLET BY  MOUTH BEFORE BREAKFAST  . glucose blood (ONE TOUCH ULTRA TEST) test strip Use as instructed  . HUMALOG KWIKPEN 100 UNIT/ML KiwkPen INJECT INTO SKIN 3 TIMES DAILY BEFORE MEALS USING SLIDING SCALE. 151 TO 200 = 4 UNITS, 201 TO 250= 6 UNITS, 251 TO 300= 8 UNITS, GREATER THA  . hydrALAZINE (APRESOLINE) 50 MG tablet TAKE 1 TABLET BY MOUTH 3  TIMES DAILY  . INS SYRINGE/NEEDLE 1CC/28G (B-D INSULIN SYRINGE 1CC/28G) 28G X 1/2" 1 ML MISC Use monthly  with B12 shot.  Marland Kitchen lisinopril-hydrochlorothiazide (PRINZIDE,ZESTORETIC) 20-25 MG tablet TAKE 1 TABLET BY MOUTH  EVERY MORNING  . meclizine (ANTIVERT) 25 MG tablet Take 0.5-1 tablets (12.5-25 mg total) by mouth 2 (two) times daily as needed for dizziness.  Marland Kitchen NOVOFINE 30G X 8 MM MISC FOR USE WITH INSULIN PENS.  . ONE TOUCH ULTRA TEST test strip CHECK UP TO 3 TIMES DAILY  AS NEEDED  . potassium chloride SA (K-DUR,KLOR-CON) 20 MEQ tablet TAKE 1 TABLET BY MOUTH  DAILY  . tamsulosin (FLOMAX) 0.4 MG CAPS capsule TAKE 1 CAPSULE BY MOUTH  DAILY AFTER SUPPER  . [name] SOLOSTAR 300 UNIT/ML SOPN INJECT 12 UNITS INTO THE SKIN AT BEDTIME.  . traMADol (ULTRAM) 50 MG tablet Take 1 tablet (50 mg total) by mouth every 12 (twelve) hours as needed (for knee pain).  . cyanocobalamin (,VITAMIN B-12,) 1000 MCG/ML injection Inject 1 mL (1,000 mcg total) into the muscle every 6 (six) weeks. (Patient not taking: Reported  on 12/17/2016)  . hydrALAZINE (APRESOLINE) 50 MG tablet Take 1 tablet by mouth 3  times daily (Patient not taking: Reported on 12/17/2016)  . hydrALAZINE (APRESOLINE) 50 MG tablet TAKE 1 TABLET BY MOUTH 3  TIMES DAILY (Patient not taking: Reported on 12/17/2016)   No facility-administered encounter medications on file as of 12/17/2016.     Activities of Daily Living In your present state of health, do you have any difficulty performing the following activities: 12/17/2016  Hearing? Y  Vision? N  Difficulty concentrating or making decisions? N  Walking or climbing stairs? N  Dressing or bathing? N  Doing errands, shopping? N  Preparing Food and eating ? N  Using the Toilet? N  In the past six months, have you accidently leaked urine? N  Do you have problems with loss of bowel control? N  Managing your Medications? N  Managing your Finances? N  Housekeeping or managing your Housekeeping? N  Some recent data might be hidden    Patient Care Team: Tonia Ghent, MD as PCP - General (Family Medicine) Clent Jacks, MD as Consulting Physician (Ophthalmology) Druscilla Brownie, MD as Consulting Physician (Dermatology) Frederik Pear, MD as Consulting Physician (Orthopedic Surgery) Raynelle Bring, MD as Consulting Physician (Urology) Moss Mc, DDS as Referring Physician (Dentistry)   Assessment:    Hearing Screening Comments: Bilateral hearing aids Vision Screening Comments: Last vision exam in April 2018  Exercise Activities and Dietary recommendations Current Exercise Habits: Home exercise routine, Type of exercise: calisthenics, Time (Minutes): 10, Frequency (Times/Week): 7, Weekly Exercise (Minutes/Week): 70, Intensity: Mild, Exercise limited by: None identified  Goals    Starting 12/17/2016, I will continue to drink 6-8 glasses of water daily.      Fall Risk Fall Risk  12/17/2016 12/17/2015 06/18/2012  Falls in the past year? No No No   Depression Screen PHQ 2/9 Scores  12/17/2016 12/17/2015 06/18/2012  PHQ - 2 Score 0 0 1  PHQ- 9 Score 0 - -    Cognitive Function MMSE - Mini Mental State Exam 12/17/2016 12/17/2015 05/04/2014  Orientation to time 5 5 4   Orientation to Place 5 5 5   Registration 3 3 3   Attention/ Calculation 0 0 0  Recall 3 2 3   Recall-comments - pt was unable to recall 1 of 3 words -  Language- name 2 objects 0 0 2  Language- repeat 1 1 1   Language- follow 3 step command 2 3 3   Language- follow 3 step command-comments unable to follow 1 step  of 3 step command - -  Language- read & follow direction 0 0 1  Write a sentence 0 0 1  Copy design 0 0 0  Total score 19 19 23        PLEASE NOTE: A Mini-Cog screen was completed. Maximum score is 20. A value of 0 denotes this part of [place] MMSE was not completed or the patient failed this part of the Mini-Cog screening.   Mini-Cog Screening Orientation to Time - Max 5 pts Orientation to Place - Max 5 pts Registration - Max 3 pts Recall - Max 3 pts Language Repeat - Max 1 pts Language Follow 3 Step Command - Max 3 pts   Immunization History  Administered Date(s) Administered  . Influenza Split 11/30/2010, 11/24/2011  . Influenza Whole 03/27/2009  . Influenza, High Dose Seasonal PF 12/05/2014, 12/17/2016  . Influenza,inj,Quad PF,6+ Mos 12/29/2012  . Influenza-Unspecified 11/10/2013  . Pneumococcal Conjugate-13 11/10/2013  . Pneumococcal Polysaccharide-23 09/06/2004  . Td 02/12/2009  . Tdap 10/09/2011, 08/15/2014  . Zoster 07/28/2013   Screening Tests Health Maintenance  Topic Date Due  . HEMOGLOBIN A1C  12/20/2016  . OPHTHALMOLOGY EXAM  05/11/2017  . FOOT EXAM  06/23/2017  . TETANUS/TDAP  08/14/2024  . INFLUENZA VACCINE  Completed  . PNA vac Low Risk Adult  Completed      Plan:   I have personally reviewed, addressed, and noted the following in the patient's chart:  A. Medical and social history B. Use of alcohol, tobacco or illicit drugs  C. Current medications and  supplements D. Functional ability and status E.  Nutritional status F.  Physical activity G. Advance directives H. List of other physicians I.  Hospitalizations, surgeries, and ER visits in previous 12 months J.  [facility] [place] [facility] to include hearing, vision, cognitive, depression L. Referrals and appointments - none  In addition, I have reviewed and discussed with patient certain preventive protocols, quality metrics, and best practice recommendations. A written personalized care plan for preventive services as well as general preventive health recommendations were provided to patient.  See attached scanned questionnaire for additional information.   Signed,   Lindell Noe, MHA, BS, LPN Health Coach

## 2016-12-17 NOTE — Patient Instructions (Signed)
[name] , Thank you for taking time to come for your Medicare Wellness Visit. I appreciate your ongoing commitment to your health goals. Please review the following plan we discussed and let me know if I can assist you in the future.   These are the goals we discussed: Goals    Starting 12/17/2016, I will continue to drink 6-8 glasses of water daily.       This is a list of the screening recommended for you and due dates:  Health Maintenance  Topic Date Due  . Hemoglobin A1C  12/20/2016  . Eye exam for diabetics  05/11/2017  . Complete foot exam   06/23/2017  . Tetanus Vaccine  08/14/2024  . Flu Shot  Completed  . Pneumonia vaccines  Completed   Preventive Care for Adults  A healthy lifestyle and preventive care can promote health and wellness. Preventive health guidelines for adults include the following key practices.  . A routine yearly physical is a good way to check with your health care provider about your health and preventive screening. It is a chance to share any concerns and updates on your health and to receive a thorough exam.  . Visit your dentist for a routine exam and preventive care every 6 months. Brush your teeth twice a day and floss once a day. Good oral hygiene prevents tooth decay and gum disease.  . The frequency of eye exams is based on your age, health, family medical history, use  of contact lenses, and other factors. Follow your health care provider's recommendations for frequency of eye exams.  . Eat a healthy diet. Foods like vegetables, fruits, whole grains, low-fat dairy products, and lean protein foods contain the nutrients you need without too many calories. Decrease your intake of foods high in solid fats, added sugars, and salt. Eat the right amount of calories for you. Get information about a proper diet from your health care provider, if necessary.  . Regular physical exercise is one of the most important things you can do for your health. Most  adults should get at least 150 minutes of moderate-intensity exercise (any activity that increases your heart rate and causes you to sweat) each week. In addition, most adults need muscle-strengthening exercises on 2 or more days a week.  Silver Sneakers may be a benefit available to you. To determine eligibility, you may visit the website: www.silversneakers.com or contact program at 504-817-1696 Mon-Fri between 8AM-8PM.   . Maintain a healthy weight. The body mass index (BMI) is a screening tool to identify possible weight problems. It provides an estimate of body fat based on height and weight. Your health care provider can find your BMI and can help you achieve or maintain a healthy weight.   For adults 20 years and older: ? A BMI below 18.5 is considered underweight. ? A BMI of 18.5 to 24.9 is normal. ? A BMI of 25 to 29.9 is considered overweight. ? A BMI of 30 and above is considered obese.   . Maintain normal blood lipids and cholesterol levels by exercising and minimizing your intake of saturated fat. Eat a balanced diet with plenty of fruit and vegetables. Blood tests for lipids and cholesterol should begin at age 72 and be repeated every 5 years. If your lipid or cholesterol levels are high, you are over 50, or you are at high risk for heart disease, you may need your cholesterol levels checked more frequently. Ongoing high lipid and cholesterol levels should be  treated with medicines if diet and exercise are not working.  . If you smoke, find out from your health care provider how to quit. If you do not use tobacco, please do not start.  . If you choose to drink alcohol, please do not consume more than 2 drinks per day. One drink is considered to be 12 ounces (355 mL) of beer, 5 ounces (148 mL) of wine, or 1.5 ounces (44 mL) of liquor.  . If you are 47-46 years old, ask your health care provider if you should take aspirin to prevent strokes.  . Use sunscreen. Apply sunscreen  liberally and repeatedly throughout the day. You should seek shade when your shadow is shorter than you. Protect yourself by wearing long sleeves, pants, a wide-brimmed hat, and sunglasses year round, whenever you are outdoors.  . Once a month, do a whole body skin exam, using a mirror to look at the skin on your back. Tell your health care provider of new moles, moles that have irregular borders, moles that are larger than a pencil eraser, or moles that have changed in shape or color.

## 2016-12-18 NOTE — Progress Notes (Signed)
   Subjective:    Patient ID: [name], male    DOB: November 24, 1934, 82 y.o.   MRN: [id number]  HPI I reviewed health advisor's note, was available for consultation, and agree with documentation and plan.    Review of Systems     Objective:   Physical Exam        Assessment & Plan:

## 2016-12-23 ENCOUNTER — Encounter: Payer: Self-pay | Admitting: Family Medicine

## 2016-12-23 ENCOUNTER — Telehealth: Payer: Self-pay | Admitting: *Deleted

## 2016-12-23 ENCOUNTER — Ambulatory Visit (INDEPENDENT_AMBULATORY_CARE_PROVIDER_SITE_OTHER): Payer: Medicare Other | Admitting: Family Medicine

## 2016-12-23 VITALS — BP 140/86 | HR 61 | Temp 97.6°F | Ht 68.5 in | Wt 228.5 lb

## 2016-12-23 DIAGNOSIS — M170 Bilateral primary osteoarthritis of knee: Secondary | ICD-10-CM

## 2016-12-23 DIAGNOSIS — E1159 Type 2 diabetes mellitus with other circulatory complications: Secondary | ICD-10-CM

## 2016-12-23 DIAGNOSIS — I10 Essential (primary) hypertension: Secondary | ICD-10-CM | POA: Diagnosis not present

## 2016-12-23 DIAGNOSIS — E119 Type 2 diabetes mellitus without complications: Secondary | ICD-10-CM

## 2016-12-23 DIAGNOSIS — E538 Deficiency of other specified B group vitamins: Secondary | ICD-10-CM

## 2016-12-23 DIAGNOSIS — Z0000 Encounter for general adult medical examination without abnormal findings: Secondary | ICD-10-CM

## 2016-12-23 DIAGNOSIS — Z7189 Other specified counseling: Secondary | ICD-10-CM

## 2016-12-23 MED ORDER — CYANOCOBALAMIN 1000 MCG/ML IJ SOLN: 1000.0000 ug | INTRAMUSCULAR | 0 refills | Status: DC

## 2016-12-23 MED ORDER — "INSULIN SYRINGE/NEEDLE 28G X 1/2"" 1 ML MISC": 0 refills | Status: DC

## 2016-12-23 MED ORDER — DICLOFENAC SODIUM 1 % TD GEL: 2.0000 g | Freq: Four times a day (QID) | TRANSDERMAL | 12 refills | Status: DC

## 2016-12-23 MED ORDER — TRAMADOL HCL 50 MG PO TABS: 50.0000 mg | ORAL_TABLET | Freq: Two times a day (BID) | ORAL | 2 refills | Status: DC | PRN

## 2016-12-23 MED ORDER — GLIMEPIRIDE 4 MG PO TABS: ORAL_TABLET | ORAL | 3 refills | Status: DC

## 2016-12-23 MED ORDER — HYDRALAZINE HCL 50 MG PO TABS: 50.0000 mg | ORAL_TABLET | Freq: Three times a day (TID) | ORAL | 3 refills | Status: DC

## 2016-12-23 NOTE — Patient Instructions (Signed)
Try diclofenac gel on the knuckles as needed.  See if that helps with the pain.  Recheck labs in about 6 months prior to a visit.  You don't need to fast for labs.  Restart B12 every other month.  Take care.  Glad to see you.

## 2016-12-23 NOTE — Progress Notes (Signed)
He had a good birthday yesterday.  D/w pt.   Flu vaccine - administered Shingles d/w pt.  Shingrix not yet available.   PNA and Tetanus up to date.  PSA and colon CA screening declined due to age.   Living will d/w pt. Both his sister Darliss Ridgel and his daughter equally designated if he were incapacitated.  Mini-Cog score: 19/20.  No red flag events.  Normal recall of recent events.  Managing his meds.  Living alone w/o troubles, is still independent.   No falls.    Hypertension:    Using medication without problems or lightheadedness: yes Chest pain with exertion:no Edema:no Short of breath:no  B12 prev replaced. Off replacement currently.  D/w pt about options going forward.    Diabetes:  Using medications without difficulties: yes Hypoglycemic episodes: no Hyperglycemic episodes: no Feet problems: no Blood Sugars averaging: ~150-200.   eye exam within last year: yes Labs d/w pt.    We talked about his prev illness.  He is back to baseline as after [facility] and [facility] w/u.  I still don't have a clear explanation for his episode but he has no apparent residual deficit; he doesn't have an explanation either.  He feels well w/o tremor, weakness, etc.   B MCP OA pain.  Worse with movement, weather changes.  Puffy and swelling occ.  L>R hand.  Tylenol didn't help. D/w pt about diclofenac gel.  Tramadol helped his knee pain, but not his hand pain.  No ADE on tramadol.   PMH and SH reviewed  Meds, vitals, and allergies reviewed.   ROS: Per HPI unless specifically indicated in ROS section   GEN: nad, alert and oriented HEENT: mucous membranes moist NECK: supple w/o LA CV: rrr. PULM: ctab, no inc wob ABD: soft, +bs EXT: no edema SKIN: no acute rash Chronic OA changes noted on the B hands.    Diabetic foot exam: Normal inspection No skin breakdown No calluses  Normal DP pulses Normal sensation to light touch and monofilament Nails thickened.

## 2016-12-23 NOTE — Assessment & Plan Note (Signed)
Living will d/w pt. Both his sister Darliss Ridgel and his daughter equally designated if he were incapacitated.

## 2016-12-23 NOTE — Telephone Encounter (Signed)
PA sent thru [facility] for Diclofenac Gel, awaiting response.

## 2016-12-25 ENCOUNTER — Encounter: Payer: Medicare Other | Admitting: Family Medicine

## 2016-12-25 NOTE — Assessment & Plan Note (Signed)
Given his age, goal A1c is ~8 and I don't want to induce hypoglycemia.  D/w pt.  Continue work on diet and exercise, no change in meds.  Labs d/w pt. Recheck labs in about 6 months prior to a visit.  Update me sooner if needed.  He agrees.  Marland Kitchen

## 2016-12-25 NOTE — Assessment & Plan Note (Signed)
Flu vaccine - administered Shingles d/w pt.  Shingrix not yet available.   PNA and Tetanus up to date.  PSA and colon CA screening declined due to age.   Living will d/w pt. Both his sister Darliss Ridgel and his daughter equally designated if he were incapacitated.  Mini-Cog score: 19/20.  No red flag events.  Normal recall of recent events.  Managing his meds.  Living alone w/o troubles, is still independent.   No falls.

## 2016-12-25 NOTE — Assessment & Plan Note (Signed)
Reasonable control, continue as is.  He agrees.  Labs d/w pt.

## 2016-12-25 NOTE — Telephone Encounter (Signed)
PA approved thru 02/09/18, letter scanned.

## 2016-12-25 NOTE — Assessment & Plan Note (Signed)
Restart B12 every other month.  Recheck in about 6 months.  He agrees.

## 2016-12-25 NOTE — Assessment & Plan Note (Signed)
Continue tramadol for knee pain.  Try diclofenac gel on the knuckles as needed, to see if that helps with the pain.

## 2016-12-31 ENCOUNTER — Other Ambulatory Visit: Payer: Self-pay | Admitting: Family Medicine

## 2016-12-31 NOTE — Telephone Encounter (Signed)
Noted. Thanks.

## 2016-12-31 NOTE — Telephone Encounter (Signed)
Looks like patient was given a written script for Tramadol at office visit on 12/23/16. Pharmacist was notified that it looks like script for given to patient at office visit and he needs to bring that in for her to fill.  Pharmacist stated that she will call the patient and advise him of this.

## 2017-02-26 ENCOUNTER — Telehealth: Payer: Self-pay | Admitting: Family Medicine

## 2017-02-26 NOTE — Telephone Encounter (Signed)
Left message asking daughter (joy) to call office. Have a question on FMLA  What is the start and end date does she want for her FMLA

## 2017-02-28 DIAGNOSIS — R040 Epistaxis: Secondary | ICD-10-CM | POA: Diagnosis not present

## 2017-03-01 ENCOUNTER — Emergency Department (HOSPITAL_COMMUNITY)
Admission: EM | Admit: 2017-03-01 | Discharge: 2017-03-01 | Disposition: A | Discharge status: Home / Self Care | Payer: Medicare Other | Source: Home / Self Care | Attending: Emergency Medicine | Admitting: Emergency Medicine

## 2017-03-01 ENCOUNTER — Encounter (HOSPITAL_COMMUNITY): Payer: Self-pay | Admitting: Emergency Medicine

## 2017-03-01 ENCOUNTER — Other Ambulatory Visit: Payer: Self-pay

## 2017-03-01 DIAGNOSIS — E119 Type 2 diabetes mellitus without complications: Secondary | ICD-10-CM

## 2017-03-01 DIAGNOSIS — R040 Epistaxis: Secondary | ICD-10-CM

## 2017-03-01 DIAGNOSIS — I10 Essential (primary) hypertension: Secondary | ICD-10-CM | POA: Insufficient documentation

## 2017-03-01 DIAGNOSIS — Z79899 Other long term (current) drug therapy: Secondary | ICD-10-CM

## 2017-03-01 DIAGNOSIS — R030 Elevated blood-pressure reading, without diagnosis of hypertension: Secondary | ICD-10-CM | POA: Diagnosis not present

## 2017-03-01 DIAGNOSIS — I2510 Atherosclerotic heart disease of native coronary artery without angina pectoris: Secondary | ICD-10-CM | POA: Insufficient documentation

## 2017-03-01 LAB — CBC
HEMATOCRIT: 45 % (ref 39.0–52.0)
Hemoglobin: 15.6 g/dL (ref 13.0–17.0)
MCH: 31.6 pg (ref 26.0–34.0)
MCHC: 34.7 g/dL (ref 30.0–36.0)
MCV: 91.3 fL (ref 78.0–100.0)
PLATELETS: 303 10*3/uL (ref 150–400)
RBC: 4.93 MIL/uL (ref 4.22–5.81)
RDW: 13.6 % (ref 11.5–15.5)
WBC: 8.8 10*3/uL (ref 4.0–10.5)

## 2017-03-01 MED ORDER — OXYMETAZOLINE HCL 0.05 % NA SOLN: 2.0000 | NASAL | 0 refills | Status: DC | PRN

## 2017-03-01 NOTE — ED Notes (Signed)
Bed: WTR7 Expected date:  Expected time:  Means of arrival:  Comments: 

## 2017-03-01 NOTE — ED Triage Notes (Signed)
Patient woke up with nose bleed. Patient called EMS. Patient live at home by hisself. Patient is hard of hearing.

## 2017-03-01 NOTE — Discharge Instructions (Signed)

## 2017-03-01 NOTE — ED Provider Notes (Signed)
Emergency Department Provider Note   I have reviewed the triage vital signs and the nursing notes.   HISTORY  Chief Complaint Epistaxis   HPI [name] is a 83 y.o. male presents to the emergency department for evaluation of epistaxis.  The nosebleed woke the patient from sleep at approximately 3 AM.  The patient states that he had large volume bleeding from the left nostril.  He is on 81 mg aspirin.  He denies any choking or vomiting.  He held pressure but ultimately called EMS because of the large volume of bleeding.  Does not have history of prior severe nosebleeds.  No injury to the nose.  Past Medical History:  Diagnosis Date  . Anxiety   . Borderline hyperlipidemia   . CAD (coronary artery disease)   . Colon polyps   . Diverticulosis of colon   . DJD (degenerative joint disease)   . DM (diabetes mellitus) ([place])    Adult onset  . History of gastritis   . History of kidney stones   . History of pyelonephritis   . History of syncope   . HTN (hypertension)   . Hypertrophy of prostate with urinary obstruction and other lower urinary tract symptoms (LUTS)   . Myocardial infarction ([place])    MORE THAN 10 YRS AGO - TX'D MEDICALLY - NO STENTS     DR. WALL CARDIOLOGIST  . Myoclonus     Patient Active Problem List   Diagnosis Date Noted  . Vitamin B 12 deficiency 08/17/2015  . Elevated TSH 10/06/2014  . Midline low back pain without sciatica 09/18/2014  . Chronic combined systolic and diastolic congestive heart failure ([place]) 01/27/2014  . History of coronary artery disease   . Chest pain 12/15/2013  . Healthcare maintenance 11/10/2013  . Advance care planning 11/10/2013  . ED (erectile dysfunction) 06/24/2013  . Dyslipidemia 03/22/2012  . Type 2 diabetes mellitus with vascular disease ([place]) 03/22/2012  . Thyroid cyst 03/19/2011  . HYPERTROPHY PROSTATE W/UR OBST & OTH LUTS 03/15/2010  . GASTRITIS 02/21/2007  . Anxiety state 02/15/2007  . CAD 02/15/2007  .  DIVERTICULOSIS OF COLON 02/15/2007  . COLONIC POLYPS 02/12/2007  . Essential hypertension 02/12/2007  . RENAL CALCULUS 02/12/2007  . Osteoarthritis 02/12/2007    Past Surgical History:  Procedure Laterality Date  . APPENDECTOMY    . CATARACT EXTRACTION     RIGHT EYE  . CHOLECYSTECTOMY    . cystoscopy  04/17/11   stent placed  . CYSTOSCOPY W/ URETERAL STENT PLACEMENT  04/17/2011   Procedure: CYSTOSCOPY WITH RETROGRADE PYELOGRAM/URETERAL STENT PLACEMENT;  Surgeon: Hanley Ben, MD;  Location: [facility];  Service: Urology;  Laterality: Left;  . CYSTOSCOPY WITH RETROGRADE PYELOGRAM, URETEROSCOPY AND STENT PLACEMENT Left 04/18/2013   Procedure: CYSTOSCOPY WITH LEFT RETROGRADE PYELOGRAM, LEFT URETEROSCOPY AND LASER LITHOTRIPSY LEFT STENT PLACEMENT;  Surgeon: Dutch Gray, MD;  Location: [facility];  Service: Urology;  Laterality: Left;  . HOLMIUM LASER APPLICATION Left 07/16/4401   Procedure: HOLMIUM LASER APPLICATION;  Surgeon: Dutch Gray, MD;  Location: [facility];  Service: Urology;  Laterality: Left;  . IRRIGATION AND DEBRIDEMENT ABSCESS Left 10/05/2014   Procedure: IAND D LEFT INDEX FINGER;  Surgeon: Dayna Barker, MD;  Location: [facility];  Service: Plastics;  Laterality: Left;  . KNEE SURGERY     bilateral ARTHROSCOPY X2 TO EACH KNEE  . MELANOMA EXCISION  2018  . S/P cysto & stents for kidnedy stone 11/08 by Dr. Reece Agar    . 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 SHOULDER SURGERY  04/2005   left by Dr. Berenice Primas    Current Outpatient Rx  . Order #: [id number] Class: Normal  . Order #: [id number] Class: Historical Med  . Order #: [id number] Class: Normal  . Order #: [id number] Class: Historical Med  . Order #: [id number] Class: Historical Med  . Order #: [id number] Class: Normal  . Order #: [id number] Class: Normal  . Order #: [id number] Class: Normal  . Order #: [id number] Class: Historical Med  . Order #: [id number] Class: Normal  . Order #:  [id number] Class: Normal  . Order #: [id number] Class: Normal  . Order #: [id number] Class: Normal  . Order #: [id number] Class: OTC  . Order #: [id number] Class: Normal  . Order #: [id number] Class: Normal  . Order #: [id number] Class: Print  . Order #: [id number] Class: Normal  . Order #: [id number] Class: Normal  . Order #: [id number] Class: Normal  . Order #: [id number] Class: Print    Allergies Codeine phosphate; Morphine; and Metformin and related  Family History  Problem Relation Age of Onset  . Heart attack Mother   . Dementia Brother   . Colon cancer Neg Hx   . Prostate cancer Neg Hx     Social History Social History   Tobacco Use  . Smoking status: Never Smoker  . Smokeless tobacco: Never Used  Substance Use Topics  . Alcohol use: No    Alcohol/week: 0.0 oz  . Drug use: No    Review of Systems  Constitutional: No fever/chills Eyes: No visual changes. ENT: No sore throat. Positive epistaxis.  Cardiovascular: Denies chest pain. Respiratory: Denies shortness of breath. Gastrointestinal: No abdominal pain.  No nausea, no vomiting.  No diarrhea.  No constipation. Genitourinary: Negative for dysuria. Musculoskeletal: Negative for back pain. Skin: Negative for rash. Neurological: Negative for headaches, focal weakness or numbness.  10-point ROS otherwise negative.  ____________________________________________   PHYSICAL EXAM:  VITAL SIGNS: ED Triage Vitals [03/01/17 0610]  Enc Vitals Group     BP 137/84     Pulse Rate 98     Resp 18     Temp 98.1 F (36.7 C)     Temp Source Oral     SpO2 94 %     Weight 219 lb (99.3 kg)     Height 5\' 9"  (1.753 m)   Constitutional: Alert and oriented. Well appearing and in no acute distress. Eyes: Conjunctivae are normal. Head: Atraumatic. Nose: No congestion/rhinnorhea. Small clot without surrounding oozing along the left nasal septum. No active bleeding.  Mouth/Throat: Mucous membranes are moist.  Neck: No stridor.     Cardiovascular: Good peripheral circulation. Respiratory: Normal respiratory effort.   Gastrointestinal: No distention.  Musculoskeletal: No lower extremity tenderness nor edema. No gross deformities of extremities. Neurologic:  Normal speech and language. No gross focal neurologic deficits are appreciated.  Skin:  Skin is warm, dry and intact. No rash noted.  ____________________________________________   LABS (all labs ordered are listed, but only abnormal results are displayed)  Labs Reviewed  CBC   ____________________________________________   PROCEDURES  Procedure(s) performed:   Procedures  None ____________________________________________   INITIAL IMPRESSION / ASSESSMENT AND PLAN / ED COURSE  Pertinent labs & imaging results that were available during my care of the patient were reviewed by me and considered in my medical decision making (see chart for details).  Patient presents to the emergency department for evaluation after epistaxis which woke him from sleep.  There is a clotted area along  the left nasal septum without oozing or active bleeding.  I counseled the patient on epistaxis management at home and send him home with a nasal clamp.  Prescribing Afrin to use if bleeding returns.  Discussed using moisturizing spray and provided phone number for ear nose and throat in case nose bleeding returns but he is able to control it at home.  Discussed return precautions in detail.  CBC ordered from triage was normal.  At this time, I do not feel there is any life-threatening condition present. I have reviewed and discussed all results (EKG, imaging, lab, urine as appropriate), exam findings with patient. I have reviewed nursing notes and appropriate previous records.  I feel the patient is safe to be discharged home without further emergent workup. Discussed usual and customary return precautions. Patient and family (if present) verbalize understanding and are  comfortable with this plan.  Patient will follow-up with their primary care provider. If they do not have a primary care provider, information for follow-up has been provided to them. All questions have been answered.  ____________________________________________  FINAL CLINICAL IMPRESSION(S) / ED DIAGNOSES  Final diagnoses:  Epistaxis     MEDICATIONS GIVEN DURING THIS VISIT:  Medications - No data to display   NEW OUTPATIENT MEDICATIONS STARTED DURING THIS VISIT:  New Prescriptions   OXYMETAZOLINE (AFRIN NASAL SPRAY) 0.05 % NASAL SPRAY    Place 2 sprays into both nostrils as needed for congestion (Nose bleed).    Note:  This document was prepared using Dragon voice recognition software and may include unintentional dictation errors.  Nanda Quinton, MD Emergency Medicine    Long, [facility], MD 03/01/17 [phone]

## 2017-03-02 ENCOUNTER — Inpatient Hospital Stay (HOSPITAL_COMMUNITY)
Admission: EM | Admit: 2017-03-02 | Discharge: 2017-03-04 | DRG: 151 | Disposition: A | Discharge status: Home Health | Payer: Medicare Other | Attending: Internal Medicine | Admitting: Internal Medicine

## 2017-03-02 ENCOUNTER — Encounter (HOSPITAL_COMMUNITY): Payer: Self-pay | Admitting: Emergency Medicine

## 2017-03-02 ENCOUNTER — Emergency Department (HOSPITAL_COMMUNITY): Payer: Medicare Other

## 2017-03-02 ENCOUNTER — Telehealth: Payer: Self-pay | Admitting: Family Medicine

## 2017-03-02 DIAGNOSIS — I5042 Chronic combined systolic (congestive) and diastolic (congestive) heart failure: Secondary | ICD-10-CM | POA: Diagnosis not present

## 2017-03-02 DIAGNOSIS — Z79899 Other long term (current) drug therapy: Secondary | ICD-10-CM

## 2017-03-02 DIAGNOSIS — F419 Anxiety disorder, unspecified: Secondary | ICD-10-CM | POA: Diagnosis present

## 2017-03-02 DIAGNOSIS — I252 Old myocardial infarction: Secondary | ICD-10-CM | POA: Diagnosis not present

## 2017-03-02 DIAGNOSIS — R350 Frequency of micturition: Secondary | ICD-10-CM | POA: Diagnosis not present

## 2017-03-02 DIAGNOSIS — N390 Urinary tract infection, site not specified: Secondary | ICD-10-CM | POA: Diagnosis not present

## 2017-03-02 DIAGNOSIS — Z7982 Long term (current) use of aspirin: Secondary | ICD-10-CM

## 2017-03-02 DIAGNOSIS — I10 Essential (primary) hypertension: Secondary | ICD-10-CM | POA: Diagnosis present

## 2017-03-02 DIAGNOSIS — E785 Hyperlipidemia, unspecified: Secondary | ICD-10-CM | POA: Diagnosis present

## 2017-03-02 DIAGNOSIS — I2510 Atherosclerotic heart disease of native coronary artery without angina pectoris: Secondary | ICD-10-CM | POA: Diagnosis present

## 2017-03-02 DIAGNOSIS — I110 Hypertensive heart disease with heart failure: Secondary | ICD-10-CM | POA: Diagnosis not present

## 2017-03-02 DIAGNOSIS — Z85820 Personal history of malignant melanoma of skin: Secondary | ICD-10-CM

## 2017-03-02 DIAGNOSIS — Z794 Long term (current) use of insulin: Secondary | ICD-10-CM

## 2017-03-02 DIAGNOSIS — R040 Epistaxis: Secondary | ICD-10-CM | POA: Diagnosis not present

## 2017-03-02 DIAGNOSIS — R030 Elevated blood-pressure reading, without diagnosis of hypertension: Secondary | ICD-10-CM | POA: Diagnosis not present

## 2017-03-02 DIAGNOSIS — N401 Enlarged prostate with lower urinary tract symptoms: Secondary | ICD-10-CM | POA: Diagnosis present

## 2017-03-02 DIAGNOSIS — R42 Dizziness and giddiness: Secondary | ICD-10-CM | POA: Diagnosis not present

## 2017-03-02 DIAGNOSIS — E1159 Type 2 diabetes mellitus with other circulatory complications: Secondary | ICD-10-CM | POA: Diagnosis present

## 2017-03-02 DIAGNOSIS — Z9841 Cataract extraction status, right eye: Secondary | ICD-10-CM | POA: Diagnosis not present

## 2017-03-02 DIAGNOSIS — I517 Cardiomegaly: Secondary | ICD-10-CM | POA: Diagnosis not present

## 2017-03-02 DIAGNOSIS — I152 Hypertension secondary to endocrine disorders: Secondary | ICD-10-CM | POA: Diagnosis present

## 2017-03-02 DIAGNOSIS — E119 Type 2 diabetes mellitus without complications: Secondary | ICD-10-CM | POA: Diagnosis not present

## 2017-03-02 LAB — BASIC METABOLIC PANEL
Anion gap: 8 (ref 5–15)
BUN: 29 mg/dL — AB (ref 6–20)
CO2: 24 mmol/L (ref 22–32)
CREATININE: 1.19 mg/dL (ref 0.61–1.24)
Calcium: 9.6 mg/dL (ref 8.9–10.3)
Chloride: 106 mmol/L (ref 101–111)
GFR calc Af Amer: >60 mL/min (ref >60)
GFR, EST NON AFRICAN AMERICAN: 56 mL/min — AB (ref >60)
Glucose, Bld: 378 mg/dL — ABNORMAL HIGH (ref 65–99)
POTASSIUM: 3.9 mmol/L (ref 3.5–5.1)
SODIUM: 138 mmol/L (ref 135–145)

## 2017-03-02 LAB — URINALYSIS, ROUTINE W REFLEX MICROSCOPIC
Bilirubin Urine: NEGATIVE
Glucose, UA: >=500 mg/dL — AB
Hgb urine dipstick: NEGATIVE
Ketones, ur: 5 mg/dL — AB
Nitrite: POSITIVE — AB
PROTEIN: NEGATIVE mg/dL
SQUAMOUS EPITHELIAL / LPF: NONE SEEN
Specific Gravity, Urine: 1.023 (ref 1.005–1.030)
pH: 6 (ref 5.0–8.0)

## 2017-03-02 LAB — CBC WITH DIFFERENTIAL/PLATELET
BASOS PCT: 0 %
Basophils Absolute: 0 10*3/uL (ref 0.0–0.1)
EOS ABS: 0.2 10*3/uL (ref 0.0–0.7)
EOS PCT: 2 %
HCT: 41.7 % (ref 39.0–52.0)
Hemoglobin: 14.3 g/dL (ref 13.0–17.0)
Lymphocytes Relative: 22 %
Lymphs Abs: 1.9 10*3/uL (ref 0.7–4.0)
MCH: 31.4 pg (ref 26.0–34.0)
MCHC: 34.3 g/dL (ref 30.0–36.0)
MCV: 91.6 fL (ref 78.0–100.0)
Monocytes Absolute: 0.7 10*3/uL (ref 0.1–1.0)
Monocytes Relative: 8 %
Neutro Abs: 5.9 10*3/uL (ref 1.7–7.7)
Neutrophils Relative %: 68 %
PLATELETS: 290 10*3/uL (ref 150–400)
RBC: 4.55 MIL/uL (ref 4.22–5.81)
RDW: 13.8 % (ref 11.5–15.5)
WBC: 8.7 10*3/uL (ref 4.0–10.5)

## 2017-03-02 LAB — I-STAT CG4 LACTIC ACID, ED: LACTIC ACID, VENOUS: 1.22 mmol/L (ref 0.5–1.9)

## 2017-03-02 LAB — I-STAT TROPONIN, ED: TROPONIN I, POC: 0.02 ng/mL (ref 0.00–0.08)

## 2017-03-02 LAB — APTT: aPTT: 26 seconds (ref 24–36)

## 2017-03-02 LAB — PROTIME-INR
INR: 1.07
PROTHROMBIN TIME: 13.8 s (ref 11.4–15.2)

## 2017-03-02 LAB — BRAIN NATRIURETIC PEPTIDE: B Natriuretic Peptide: 55 pg/mL (ref 0.0–100.0)

## 2017-03-02 LAB — GLUCOSE, CAPILLARY: Glucose-Capillary: 271 mg/dL — ABNORMAL HIGH (ref 65–99)

## 2017-03-02 LAB — CBG MONITORING, ED: GLUCOSE-CAPILLARY: 366 mg/dL — AB (ref 65–99)

## 2017-03-02 MED ORDER — ACETAMINOPHEN 650 MG RE SUPP: 650.0000 mg | Freq: Four times a day (QID) | RECTAL | Status: DC | PRN

## 2017-03-02 MED ORDER — ONDANSETRON HCL 4 MG/2ML IJ SOLN: 4.0000 mg | Freq: Four times a day (QID) | INTRAMUSCULAR | Status: DC | PRN

## 2017-03-02 MED ORDER — TRAZODONE HCL 50 MG PO TABS: 50.0000 mg | ORAL_TABLET | Freq: Every evening | ORAL | Status: DC | PRN

## 2017-03-02 MED ORDER — CARVEDILOL 12.5 MG PO TABS
12.5000 mg | ORAL_TABLET | Freq: Two times a day (BID) | ORAL | Status: DC
  Administered 2017-03-02 – 2017-03-04 (×4): 12.5 mg via ORAL
  Filled 2017-03-02 (×4): qty 1

## 2017-03-02 MED ORDER — SODIUM CHLORIDE 0.9 % IV BOLUS (SEPSIS)
1000.0000 mL | Freq: Once | INTRAVENOUS | Status: AC
Start: 2017-03-02 — End: 2017-03-02
  Administered 2017-03-02: 1000 mL via INTRAVENOUS

## 2017-03-02 MED ORDER — LORAZEPAM 2 MG/ML IJ SOLN
0.5000 mg | Freq: Once | INTRAMUSCULAR | Status: AC
  Administered 2017-03-02: 0.5 mg via INTRAVENOUS

## 2017-03-02 MED ORDER — SODIUM CHLORIDE 0.9 % IV SOLN
INTRAVENOUS | Status: DC
  Administered 2017-03-02 – 2017-03-03 (×2): via INTRAVENOUS

## 2017-03-02 MED ORDER — HYDRALAZINE HCL 20 MG/ML IJ SOLN: 10.0000 mg | Freq: Four times a day (QID) | INTRAMUSCULAR | Status: DC | PRN

## 2017-03-02 MED ORDER — ENSURE ENLIVE PO LIQD
237.0000 mL | Freq: Two times a day (BID) | ORAL | Status: DC
  Administered 2017-03-04: 237 mL via ORAL

## 2017-03-02 MED ORDER — ASPIRIN EC 81 MG PO TBEC
81.0000 mg | DELAYED_RELEASE_TABLET | Freq: Every evening | ORAL | Status: DC
  Administered 2017-03-02: 81 mg via ORAL
  Filled 2017-03-02: qty 1

## 2017-03-02 MED ORDER — OXYMETAZOLINE HCL 0.05 % NA SOLN
2.0000 | NASAL | Status: DC | PRN
  Filled 2017-03-02: qty 15

## 2017-03-02 MED ORDER — LORAZEPAM 2 MG/ML IJ SOLN
0.5000 mg | Freq: Once | INTRAMUSCULAR | Status: AC
  Administered 2017-03-02: 0.5 mg via INTRAVENOUS
  Filled 2017-03-02: qty 1

## 2017-03-02 MED ORDER — LORAZEPAM 0.5 MG PO TABS: 0.5000 mg | ORAL_TABLET | Freq: Four times a day (QID) | ORAL | Status: DC | PRN

## 2017-03-02 MED ORDER — MORPHINE SULFATE (PF) 4 MG/ML IV SOLN
4.0000 mg | Freq: Once | INTRAVENOUS | Status: AC
  Administered 2017-03-02: 4 mg via INTRAVENOUS
  Filled 2017-03-02: qty 1

## 2017-03-02 MED ORDER — MECLIZINE HCL 25 MG PO TABS: 12.5000 mg | ORAL_TABLET | Freq: Two times a day (BID) | ORAL | Status: DC | PRN

## 2017-03-02 MED ORDER — ONDANSETRON HCL 4 MG/2ML IJ SOLN
4.0000 mg | Freq: Once | INTRAMUSCULAR | Status: AC
  Administered 2017-03-02: 4 mg via INTRAVENOUS
  Filled 2017-03-02: qty 2

## 2017-03-02 MED ORDER — POTASSIUM CHLORIDE CRYS ER 20 MEQ PO TBCR
20.0000 meq | EXTENDED_RELEASE_TABLET | Freq: Every day | ORAL | Status: DC
  Administered 2017-03-02 – 2017-03-04 (×3): 20 meq via ORAL
  Filled 2017-03-02 (×3): qty 1

## 2017-03-02 MED ORDER — LORAZEPAM 2 MG/ML IJ SOLN
INTRAMUSCULAR | Status: AC
  Filled 2017-03-02: qty 1

## 2017-03-02 MED ORDER — SODIUM CHLORIDE 0.9% FLUSH
3.0000 mL | Freq: Two times a day (BID) | INTRAVENOUS | Status: DC
  Administered 2017-03-02 – 2017-03-04 (×2): 3 mL via INTRAVENOUS

## 2017-03-02 MED ORDER — AMLODIPINE BESYLATE 5 MG PO TABS
10.0000 mg | ORAL_TABLET | Freq: Every morning | ORAL | Status: DC
  Administered 2017-03-03 – 2017-03-04 (×2): 10 mg via ORAL
  Filled 2017-03-02 (×2): qty 2

## 2017-03-02 MED ORDER — CYANOCOBALAMIN 1000 MCG/ML IJ SOLN: 1000.0000 ug | INTRAMUSCULAR | Status: DC

## 2017-03-02 MED ORDER — INSULIN GLARGINE 100 UNIT/ML ~~LOC~~ SOLN: 12.0000 [IU] | Freq: Every day | SUBCUTANEOUS | Status: DC

## 2017-03-02 MED ORDER — ACETAMINOPHEN 325 MG PO TABS: 650.0000 mg | ORAL_TABLET | Freq: Four times a day (QID) | ORAL | Status: DC | PRN

## 2017-03-02 MED ORDER — LABETALOL HCL 5 MG/ML IV SOLN
10.0000 mg | Freq: Once | INTRAVENOUS | Status: AC
  Administered 2017-03-02: 10 mg via INTRAVENOUS
  Filled 2017-03-02: qty 4

## 2017-03-02 MED ORDER — INSULIN ASPART 100 UNIT/ML ~~LOC~~ SOLN
0.0000 [IU] | Freq: Three times a day (TID) | SUBCUTANEOUS | Status: DC
  Administered 2017-03-03: 2 [IU] via SUBCUTANEOUS
  Administered 2017-03-03: 3 [IU] via SUBCUTANEOUS
  Administered 2017-03-03: 5 [IU] via SUBCUTANEOUS
  Administered 2017-03-04 (×2): 3 [IU] via SUBCUTANEOUS

## 2017-03-02 MED ORDER — GLIMEPIRIDE 2 MG PO TABS
2.0000 mg | ORAL_TABLET | Freq: Every day | ORAL | Status: DC
  Administered 2017-03-03 – 2017-03-04 (×2): 2 mg via ORAL
  Filled 2017-03-02 (×2): qty 1

## 2017-03-02 MED ORDER — POLYETHYLENE GLYCOL 3350 17 G PO PACK: 17.0000 g | PACK | Freq: Every day | ORAL | Status: DC | PRN

## 2017-03-02 MED ORDER — SODIUM CHLORIDE 0.9 % IV SOLN: 250.0000 mL | INTRAVENOUS | Status: DC | PRN

## 2017-03-02 MED ORDER — HYDRALAZINE HCL 50 MG PO TABS
50.0000 mg | ORAL_TABLET | Freq: Three times a day (TID) | ORAL | Status: DC
  Administered 2017-03-02 – 2017-03-04 (×5): 50 mg via ORAL
  Filled 2017-03-02 (×6): qty 1

## 2017-03-02 MED ORDER — INSULIN ASPART 100 UNIT/ML ~~LOC~~ SOLN
2.0000 [IU] | Freq: Three times a day (TID) | SUBCUTANEOUS | Status: DC
  Administered 2017-03-03 – 2017-03-04 (×5): 2 [IU] via SUBCUTANEOUS

## 2017-03-02 MED ORDER — DEXTROSE 5 % IV SOLN
1.0000 g | Freq: Once | INTRAVENOUS | Status: AC
  Administered 2017-03-02: 1 g via INTRAVENOUS
  Filled 2017-03-02: qty 10

## 2017-03-02 MED ORDER — TAMSULOSIN HCL 0.4 MG PO CAPS
0.4000 mg | ORAL_CAPSULE | Freq: Every day | ORAL | Status: DC
  Administered 2017-03-02 – 2017-03-03 (×2): 0.4 mg via ORAL
  Filled 2017-03-02 (×2): qty 1

## 2017-03-02 MED ORDER — ONDANSETRON HCL 4 MG PO TABS: 4.0000 mg | ORAL_TABLET | Freq: Four times a day (QID) | ORAL | Status: DC | PRN

## 2017-03-02 MED ORDER — TRAMADOL HCL 50 MG PO TABS: 50.0000 mg | ORAL_TABLET | Freq: Two times a day (BID) | ORAL | Status: DC | PRN

## 2017-03-02 MED ORDER — SODIUM CHLORIDE 0.9% FLUSH: 3.0000 mL | INTRAVENOUS | Status: DC | PRN

## 2017-03-02 MED ORDER — DICLOFENAC SODIUM 1 % TD GEL
2.0000 g | Freq: Four times a day (QID) | TRANSDERMAL | Status: DC
  Administered 2017-03-02 – 2017-03-03 (×4): 2 g via TOPICAL
  Filled 2017-03-02: qty 100

## 2017-03-02 MED ORDER — SENNA 8.6 MG PO TABS
1.0000 | ORAL_TABLET | Freq: Two times a day (BID) | ORAL | Status: DC
  Administered 2017-03-02 – 2017-03-04 (×4): 8.6 mg via ORAL
  Filled 2017-03-02 (×4): qty 1

## 2017-03-02 MED ORDER — OXYCODONE HCL 5 MG PO TABS: 5.0000 mg | ORAL_TABLET | ORAL | Status: DC | PRN

## 2017-03-02 MED ORDER — ALBUTEROL SULFATE (2.5 MG/3ML) 0.083% IN NEBU: 2.5000 mg | INHALATION_SOLUTION | RESPIRATORY_TRACT | Status: DC | PRN

## 2017-03-02 MED ORDER — CEFTRIAXONE SODIUM 1 G IJ SOLR
1.0000 g | INTRAMUSCULAR | Status: DC
  Administered 2017-03-03: 1 g via INTRAVENOUS
  Filled 2017-03-02 (×2): qty 10

## 2017-03-02 MED ORDER — INSULIN GLARGINE 100 UNIT/ML ~~LOC~~ SOLN
12.0000 [IU] | Freq: Every day | SUBCUTANEOUS | Status: DC
  Administered 2017-03-02 – 2017-03-03 (×2): 12 [IU] via SUBCUTANEOUS
  Filled 2017-03-02 (×3): qty 0.12

## 2017-03-02 MED ORDER — INSULIN ASPART 100 UNIT/ML ~~LOC~~ SOLN
0.0000 [IU] | Freq: Every day | SUBCUTANEOUS | Status: DC
  Administered 2017-03-02: 3 [IU] via SUBCUTANEOUS

## 2017-03-02 NOTE — ED Notes (Signed)
Repeat protocol lactic cancelled as pt's first lactic acid was negative.

## 2017-03-02 NOTE — ED Triage Notes (Addendum)
Per EMS, pt. Came from home with complaint of nose bleeding started at 0300 this morning , called EMS after  An hour of non stop bleeding. Pt. Was seen here yesterday for the same problem. Denied pain. Denied SOB. HTN and has cbg os 428mg /dl. per EMS. Taking 81mg  Aspirin nightly. No active nose bleeding upon arrival to ED

## 2017-03-02 NOTE — Telephone Encounter (Signed)
Copied from [facility] [id number]. Topic: Quick Communication - See Telephone Encounter >> Mar 02, 2017  4:30 PM Robina Ade, Helene Kelp D wrote: CRM for notification. See Telephone encounter for: 03/02/17. Patient daughter called and said that patient needs to be seen by Wednesday with Dr. Damita Dunnings for ED f/u. Patient went in for nose bleed and has a depository that needs to be remove by then. Please call patient if he can be worked in with someone else, thanks.

## 2017-03-02 NOTE — Telephone Encounter (Signed)
Spoke to Daughter Caryl Asp  She wanted start date to be today.  She is @ [place] long er now with pt.  She stated he has left sided weakness and wanted to let you know  Paperwork in dr Damita Dunnings in box For review and signature

## 2017-03-02 NOTE — ED Notes (Signed)
Pt transported to XRAY °

## 2017-03-02 NOTE — ED Notes (Signed)
MRI at bedside to take pt.

## 2017-03-02 NOTE — ED Notes (Signed)
Patient transported to CT 

## 2017-03-02 NOTE — ED Notes (Addendum)
Patient transported to MRI. VSS. A&Ox4.

## 2017-03-02 NOTE — ED Notes (Signed)
RN gave patient a cup of ice water.

## 2017-03-02 NOTE — ED Notes (Signed)
Bed: UY37 Expected date:  Expected time:  Means of arrival:  Comments: 83 yo M/Nosebleed

## 2017-03-02 NOTE — ED Provider Notes (Signed)
Medical screening examination/treatment/procedure(s) were conducted as a shared visit with non-physician practitioner(s) and myself.  I personally evaluated the patient during the encounter.   EKG Interpretation  Date/Time:  Monday March 02 2017 08:20:46 EST Ventricular Rate:  93 PR Interval:    QRS Duration: 108 QT Interval:  386 QTC Calculation: 481 R Axis:   -49 Text Interpretation:  Sinus rhythm Inferior infarct, old Normal sinus rhythm Confirmed by Lacretia Leigh ([id number]) on 03/02/2017 12:52:66 PM     83 year old male here with epistaxis as well as weakness times several days.  Slight elevation in his BUN but normal creatinine as well as hemoglobin.  No active bleeding here noted.  Patient states he has felt off balance.  Has evidence of urinary tract infection.  Started on IV antibiotics.  Head CT obtained which was negative.  Attempted MRI to rule out stroke but that was unsuccessful due to his physiologic condition.  Will consult hospitalist for admission for IV antibiotics and neurology consultation.   Lacretia Leigh, MD 03/02/17 1430
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
## 2017-03-02 NOTE — ED Notes (Signed)
Attempted to ambulate pt with pulse ox, pt was very unsteady on his feet, writer walked pt from bed to door o2 stats went from 93% to 90%. EDP aware.

## 2017-03-02 NOTE — ED Notes (Signed)
Lab reports they can add on the BNP to labwork already in the lab.

## 2017-03-02 NOTE — H&P (Signed)
Patient Demographics:    [name], is a 83 y.o. male  MRN: [id number]   DOB - 08-Mar-1934  Admit Date - 03/02/2017  Outpatient Primary MD for the patient is Tonia Ghent, MD   Assessment & Plan:    Principal Problem:   Recurrent Epistaxis Active Problems:   Essential hypertension   Type 2 diabetes mellitus with vascular disease ([place])   UTI (urinary tract infection)   Chronic combined systolic and diastolic congestive heart failure ([place])   1)Recurrent Epistaxis- Lt sided , RhinoRocket in place, hemostasis achieved, Place pt on Omnicef upon d/c home for up to 10 days (?? Lt sided Maxillary Sinusitis), possible discharge home on 03/03/2016 if remains hemostatic, follow up with PCP on 03/04/2017 for removal of Rhino Rocket.  Avoid NSAIDs.  Hemoglobin is over 14, platelets are normal  2)UTI-continue IV Rocephin for now, may discharge home on Omnicef as above, pending culture results, if discharged home on 03/03/2017 patient can call back to follow-up on final report of his urine culture and see if antibiotics need to be changed  3)DM-give Lantus insulin 12 units nightly, Amaryl 2 mg with breakfast, Use Novolog/Humalog Sliding scale insulin with Accu-Cheks/Fingersticks as ordered   4)Fatigue/Debility-CT head without acute intracranial pathology, patient may have left-sided sinus fluid from nosebleeds, get PT eval  5)HTN-BP is not at goal, Restart amlodipine 10 mg daily, Coreg 12.5 mg twice daily, hydralazine 50 mg 3 times daily  6)Disposition-anticipate discharge home on 03/03/2016 if no further nosebleeds and if patient remains stable overall, Place pt on Omnicef upon d/c home for up to 10 days (?? Lt sided Maxillary Sinusitis),  follow up with PCP on 03/04/2017 for removal of Rhino Rocket.  Avoid NSAIDs.   With  History of - Reviewed by me  Past Medical History:  Diagnosis Date  . Anxiety   . Borderline hyperlipidemia   . CAD (coronary artery disease)   . Colon polyps   . Diverticulosis of colon   . DJD (degenerative joint disease)   . DM (diabetes mellitus) ([place])    Adult onset  . History of gastritis   . History of kidney stones   . History of pyelonephritis   . History of syncope   . HTN (hypertension)   . Hypertrophy of prostate with urinary obstruction and other lower urinary tract symptoms (LUTS)   . Myocardial infarction ([place])    MORE THAN 10 YRS AGO - TX'D MEDICALLY - NO STENTS     DR. WALL CARDIOLOGIST  . Myoclonus       Past Surgical History:  Procedure Laterality Date  . APPENDECTOMY    . CATARACT EXTRACTION     RIGHT EYE  . CHOLECYSTECTOMY    . cystoscopy  04/17/11   stent placed  . CYSTOSCOPY W/ URETERAL STENT PLACEMENT  04/17/2011   Procedure: CYSTOSCOPY WITH RETROGRADE PYELOGRAM/URETERAL STENT PLACEMENT;  Surgeon: Hanley Ben, MD;  Location: [facility];  Service: Urology;  Laterality: Left;  . CYSTOSCOPY WITH RETROGRADE PYELOGRAM, URETEROSCOPY AND STENT PLACEMENT Left 04/18/2013   Procedure: CYSTOSCOPY WITH LEFT RETROGRADE PYELOGRAM, LEFT URETEROSCOPY AND LASER LITHOTRIPSY LEFT STENT PLACEMENT;  Surgeon: Dutch Gray, MD;  Location: [facility];  Service: Urology;  Laterality: Left;  . HOLMIUM LASER APPLICATION Left 07/18/6718   Procedure: HOLMIUM LASER APPLICATION;  Surgeon: Dutch Gray, MD;  Location: [facility];  Service: Urology;  Laterality: Left;  . IRRIGATION AND DEBRIDEMENT ABSCESS Left 10/05/2014   Procedure: IAND D LEFT INDEX FINGER;  Surgeon: Dayna Barker, MD;  Location: [facility];  Service: Plastics;  Laterality: Left;  . KNEE SURGERY     bilateral ARTHROSCOPY X2 TO EACH KNEE  . MELANOMA EXCISION  2018  . S/P cysto & stents for kidnedy stone 11/08 by Dr. Reece Agar    . 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  SHOULDER SURGERY  04/2005   left by Dr. Berenice Primas      Chief Complaint  Patient presents with  . Epistaxis      HPI:    [name]  is a 83 y.o. male with Pmhx relevant for obesity, hypertension diabetes who presents with recurrent nosebleeds.  She was seen on 03/01/2017 with left-sided nosebleeds, was given Afrin nosebleed stopped.  Returned 24 hours later on 03/02/2017 again with nosebleeds, Rhino Rocket placed in order to achieve hemostasis.   He denies fevers, chills, headache, vision changes, nausea, vomiting, chest pain, shortness of breath, no fever or chills  Hemoglobin is over 14, platelets are normal  Patient's daughter and sister at bedside, additional history obtained  In ED Ua suggestive of UTI, and endorses urinary frequency and malaise and fatigue, no flank pain, no dysuria, no gross hematuria, no change in color of his stool  CT head without acute intracranial pathology however there is some air-fluid levels in the left maxillary sinus most likely from epistasis    Review of systems:    In addition to the HPI above,   A full 12 point Review of 10 Systems was done, except as stated above, all other Review of 10 Systems were negative.    Social History:  Reviewed by me    Social History   Tobacco Use  . Smoking status: Never Smoker  . Smokeless tobacco: Never Used  Substance Use Topics  . Alcohol use: No    Alcohol/week: 0.0 oz       Family History :  Reviewed by me    Family History  Problem Relation Age of Onset  . Heart attack Mother   . Dementia Brother   . Colon cancer Neg Hx   . Prostate cancer Neg Hx       Home Medications:   Prior to Admission medications   Medication Sig Start Date End Date Taking? Authorizing Provider  amLODipine (NORVASC) 10 MG tablet TAKE 1 TABLET BY MOUTH  EVERY MORNING 11/14/16  Yes Tonia Ghent, MD  aspirin EC 81 MG tablet Take 81 mg by mouth every evening.    Yes [provider]  carvedilol  (COREG) 12.5 MG tablet TAKE 1 TABLET BY MOUTH 2  TIMES DAILY WITH MEALS 11/14/16  Yes Tonia Ghent, MD  COD LIVER OIL PO Take 1 capsule by mouth 2 (two) times daily.    Yes [provider]  cyanocobalamin (,VITAMIN B-12,) 1000 MCG/ML injection Inject 1 mL (1,000 mcg total) every 8 (eight) weeks into the muscle. Dispense  6 of the 1 mL vials. 12/23/16  Yes Tonia Ghent, MD  diclofenac sodium (VOLTAREN) 1 % GEL Apply 2 g 4 (four) times daily topically. On the knuckles as needed. 12/23/16  Yes Tonia Ghent, MD  glimepiride (AMARYL) 4 MG tablet TAKE ONE-HALF TABLET BY  MOUTH BEFORE BREAKFAST 12/23/16  Yes Tonia Ghent, MD  glucose blood (ONE TOUCH ULTRA TEST) test strip Use as instructed   Yes [provider]  HUMALOG KWIKPEN 100 UNIT/ML [place] 3 TIMES DAILY BEFORE MEALS USING SLIDING SCALE. 151 TO 200 = 4 UNITS, 201 TO 250= 6 UNITS, 251 TO 300= 8 UNITS, GREATER THA 06/09/16  Yes Tonia Ghent, MD  hydrALAZINE (APRESOLINE) 50 MG tablet Take 1 tablet (50 mg total) 3 (three) times daily by mouth. 12/23/16  Yes Tonia Ghent, MD  INS SYRINGE/NEEDLE 1CC/28G (B-D INSULIN SYRINGE 1CC/28G) 28G X 1/2" 1 ML MISC Use monthly with B12 shot. 12/23/16  Yes Tonia Ghent, MD  lisinopril-hydrochlorothiazide (PRINZIDE,ZESTORETIC) 20-25 MG tablet TAKE 1 TABLET BY MOUTH  EVERY MORNING 11/14/16  Yes Tonia Ghent, MD  meclizine (ANTIVERT) 25 MG tablet Take 0.5-1 tablets (12.5-25 mg total) by mouth 2 (two) times daily as needed for dizziness. 08/05/16  Yes Tonia Ghent, MD  meloxicam (MOBIC) 15 MG tablet Take 15 mg by mouth 2 (two) times daily as needed for pain.  01/21/17  Yes [provider]  NOVOFINE 30G X 8 MM MISC FOR USE WITH INSULIN PENS. 08/21/16  Yes Tonia Ghent, MD  ONE TOUCH ULTRA TEST test strip CHECK UP TO 3 TIMES DAILY  AS NEEDED 08/01/16  Yes Tonia Ghent, MD  oxymetazoline (AFRIN NASAL SPRAY) 0.05 % nasal spray Place 2 sprays into  both nostrils as needed for congestion (Nose bleed). 03/01/17  Yes Long, Wonda Olds, MD  potassium chloride SA (K-DUR,KLOR-CON) 20 MEQ tablet TAKE 1 TABLET BY MOUTH  DAILY 11/14/16  Yes Tonia Ghent, MD  tamsulosin [facility]) 0.4 MG CAPS capsule TAKE 1 CAPSULE BY MOUTH  DAILY AFTER SUPPER 11/14/16  Yes Tonia Ghent, MD  [name] SOLOSTAR 300 UNIT/ML SOPN INJECT 12 UNITS INTO THE SKIN AT BEDTIME. 08/20/16  Yes Tonia Ghent, MD  traMADol (ULTRAM) 50 MG tablet Take 1 tablet (50 mg total) every 12 (twelve) hours as needed by mouth (for knee pain). 12/23/16  Yes Tonia Ghent, MD     Allergies:     Allergies  Allergen Reactions  . Codeine Phosphate Nausea Only    Can take with food and usually doesn't cause nausea  . Morphine Nausea Only    Can take with food and usually doesn't cause nausea  . Metformin And Related Diarrhea     Physical Exam:   Vitals  Blood pressure 138/70, pulse 81, temperature 98.9 F (37.2 C), resp. rate 20, weight 101.2 kg (223 lb), SpO2 (!) 88 %.  Physical Examination: General appearance - alert, well appearing, and in no distress  Mental status - alert, oriented to person, place, and time,  Nose-Rhino Rocket in the left nostril, evidence of epistasis Eyes - sclera anicteric Neck - supple, no JVD elevation , Chest - clear  to auscultation bilaterally, symmetrical air movement,  Heart - S1 and S2 normal,  Abdomen - soft, nontender, nondistended, no masses or organomegaly Neurological - screening mental status exam normal, neck supple without rigidity, cranial nerves II through XII intact, DTR's normal and symmetric Extremities - no pedal edema noted,  intact peripheral pulses  Skin - warm, dry Psych- AAO x 3,    Data Review:    CBC Recent Labs  Lab 03/01/17 0645 03/02/17 0745  WBC 8.8 8.7  HGB 15.6 14.3  HCT 45.0 41.7  PLT 303 290  MCV 91.3 91.6  MCH 31.6 31.4  MCHC 34.7 34.3  RDW 13.6 13.8  LYMPHSABS  --  1.9  MONOABS  --  0.7  EOSABS  --   0.2  BASOSABS  --  0.0   ------------------------------------------------------------------------------------------------------------------  Chemistries  Recent Labs  Lab 03/02/17 0745  NA 138  K 3.9  CL 106  CO2 24  GLUCOSE 378*  BUN 29*  CREATININE 1.19  CALCIUM 9.6   ------------------------------------------------------------------------------------------------------------------ estimated creatinine clearance is 58.1 mL/min (by C-G formula based on SCr of 1.19 mg/dL). ------------------------------------------------------------------------------------------------------------------ No results for input(s): TSH, T4TOTAL, T3FREE, THYROIDAB in the last 72 hours.  Invalid input(s): FREET3   Coagulation profile Recent Labs  Lab 03/02/17 0745  INR 1.07   ------------------------------------------------------------------------------------------------------------------- No results for input(s): DDIMER in the last 72 hours. -------------------------------------------------------------------------------------------------------------------  Cardiac Enzymes No results for input(s): CKMB, TROPONINI, MYOGLOBIN in the last 168 hours.  Invalid input(s): CK ------------------------------------------------------------------------------------------------------------------    Component Value Date/Time   BNP 55.0 03/02/2017 0745     ---------------------------------------------------------------------------------------------------------------  Urinalysis    Component Value Date/Time   COLORURINE YELLOW 03/02/2017 0815   APPEARANCEUR CLEAR 03/02/2017 0815   LABSPEC 1.023 03/02/2017 0815   PHURINE 6.0 03/02/2017 0815   GLUCOSEU >=500 (A) 03/02/2017 0815   HGBUR NEGATIVE 03/02/2017 0815   BILIRUBINUR NEGATIVE 03/02/2017 0815   KETONESUR 5 (A) 03/02/2017 0815   PROTEINUR NEGATIVE 03/02/2017 0815   UROBILINOGEN 0.2 02/02/2014 1509   NITRITE POSITIVE (A) 03/02/2017 0815    LEUKOCYTESUR TRACE (A) 03/02/2017 0815    ----------------------------------------------------------------------------------------------------------------   Imaging Results:    Dg Chest 2 View  Result Date: 03/02/2017 CLINICAL DATA:  83 year old male with epistaxis EXAM: CHEST  2 VIEW COMPARISON:  Prior chest x-ray 03/20/2016 FINDINGS: Stable borderline cardiomegaly. Atherosclerotic calcifications are present in the transverse aorta. No pulmonary edema, focal airspace consolidation, pleural effusion or pneumothorax. Surgical changes of prior distal left clavicle resection. Mild degenerative osteoarthritis present at the right [facility] joint. IMPRESSION: 1. Stable borderline cardiomegaly. 2. No acute cardiopulmonary process. 3.  Aortic Atherosclerosis (ICD10-170.0) Electronically Signed   By: Jacqulynn Cadet M.D.   On: 03/02/2017 08:41   Ct Head Wo Contrast  Result Date: 03/02/2017 CLINICAL DATA:  Epistaxis EXAM: CT HEAD WITHOUT CONTRAST TECHNIQUE: Contiguous axial images were obtained from the base of the skull through the vertex without intravenous contrast. COMPARISON:  01/28/2014 FINDINGS: Brain: Mild atrophic changes are noted. No findings to suggest acute hemorrhage, acute infarction or space-occupying mass lesion are noted. Vascular: Choose Skull: Normal. Negative for fracture or focal lesion. Sinuses/Orbits: The orbits and their contents are within normal limits. Paranasal sinuses are well aerated with the exception of the left maxillary antrum which demonstrates an air-fluid level within. Some central density is noted which may be related to the current history of hemorrhage. Other: None. IMPRESSION: Mild atrophic changes are noted. Air-fluid level within the left maxillary antrum likely related to the current history of hemorrhage. Electronically Signed   By: Inez Catalina M.D.   On: 03/02/2017 12:33    Radiological Exams on Admission: Dg Chest 2 View  Result Date: 03/02/2017 CLINICAL DATA:   83 year old male with epistaxis EXAM: CHEST  2 VIEW COMPARISON:  Prior chest x-ray 03/20/2016 FINDINGS: Stable  borderline cardiomegaly. Atherosclerotic calcifications are present in the transverse aorta. No pulmonary edema, focal airspace consolidation, pleural effusion or pneumothorax. Surgical changes of prior distal left clavicle resection. Mild degenerative osteoarthritis present at the right [facility] joint. IMPRESSION: 1. Stable borderline cardiomegaly. 2. No acute cardiopulmonary process. 3.  Aortic Atherosclerosis (ICD10-170.0) Electronically Signed   By: Jacqulynn Cadet M.D.   On: 03/02/2017 08:41   Ct Head Wo Contrast  Result Date: 03/02/2017 CLINICAL DATA:  Epistaxis EXAM: CT HEAD WITHOUT CONTRAST TECHNIQUE: Contiguous axial images were obtained from the base of the skull through the vertex without intravenous contrast. COMPARISON:  01/28/2014 FINDINGS: Brain: Mild atrophic changes are noted. No findings to suggest acute hemorrhage, acute infarction or space-occupying mass lesion are noted. Vascular: Choose Skull: Normal. Negative for fracture or focal lesion. Sinuses/Orbits: The orbits and their contents are within normal limits. Paranasal sinuses are well aerated with the exception of the left maxillary antrum which demonstrates an air-fluid level within. Some central density is noted which may be related to the current history of hemorrhage. Other: None. IMPRESSION: Mild atrophic changes are noted. Air-fluid level within the left maxillary antrum likely related to the current history of hemorrhage. Electronically Signed   By: Inez Catalina M.D.   On: 03/02/2017 12:33   DVT Prophylaxis -SCD   AM Labs Ordered, also please review Full Orders  Family Communication: Admission, patients condition and plan of care including tests being ordered have been discussed with the patient and daughter/sister who indicate understanding and agree with the plan   Code Status - Full Code  Likely DC to  Home    Condition   stable  Roxan Hockey M.D on 03/02/2017 at 6:05 PM   Between 7am to 7pm - Pager - [phone] After 7pm go to [email or web address] - password [email or web address]  [facility] - Office  [phone]  Voice Recognition Viviann Spare dictation system was used to create this note, attempts have been made to correct errors. Please contact the author with questions and/or clarifications.

## 2017-03-03 DIAGNOSIS — Z794 Long term (current) use of insulin: Secondary | ICD-10-CM | POA: Diagnosis not present

## 2017-03-03 DIAGNOSIS — I517 Cardiomegaly: Secondary | ICD-10-CM | POA: Diagnosis not present

## 2017-03-03 DIAGNOSIS — Z85820 Personal history of malignant melanoma of skin: Secondary | ICD-10-CM | POA: Diagnosis not present

## 2017-03-03 DIAGNOSIS — R040 Epistaxis: Secondary | ICD-10-CM | POA: Diagnosis not present

## 2017-03-03 DIAGNOSIS — I5042 Chronic combined systolic (congestive) and diastolic (congestive) heart failure: Secondary | ICD-10-CM | POA: Diagnosis present

## 2017-03-03 DIAGNOSIS — E785 Hyperlipidemia, unspecified: Secondary | ICD-10-CM | POA: Diagnosis present

## 2017-03-03 DIAGNOSIS — I10 Essential (primary) hypertension: Secondary | ICD-10-CM

## 2017-03-03 DIAGNOSIS — Z7982 Long term (current) use of aspirin: Secondary | ICD-10-CM | POA: Diagnosis not present

## 2017-03-03 DIAGNOSIS — Z9841 Cataract extraction status, right eye: Secondary | ICD-10-CM | POA: Diagnosis not present

## 2017-03-03 DIAGNOSIS — N390 Urinary tract infection, site not specified: Secondary | ICD-10-CM | POA: Diagnosis present

## 2017-03-03 DIAGNOSIS — I2510 Atherosclerotic heart disease of native coronary artery without angina pectoris: Secondary | ICD-10-CM | POA: Diagnosis present

## 2017-03-03 DIAGNOSIS — I700 Atherosclerosis of aorta: Secondary | ICD-10-CM | POA: Diagnosis not present

## 2017-03-03 DIAGNOSIS — E119 Type 2 diabetes mellitus without complications: Secondary | ICD-10-CM | POA: Diagnosis present

## 2017-03-03 DIAGNOSIS — F419 Anxiety disorder, unspecified: Secondary | ICD-10-CM | POA: Diagnosis present

## 2017-03-03 DIAGNOSIS — R350 Frequency of micturition: Secondary | ICD-10-CM | POA: Diagnosis present

## 2017-03-03 DIAGNOSIS — N401 Enlarged prostate with lower urinary tract symptoms: Secondary | ICD-10-CM | POA: Diagnosis present

## 2017-03-03 DIAGNOSIS — I110 Hypertensive heart disease with heart failure: Secondary | ICD-10-CM | POA: Diagnosis not present

## 2017-03-03 DIAGNOSIS — I252 Old myocardial infarction: Secondary | ICD-10-CM | POA: Diagnosis not present

## 2017-03-03 DIAGNOSIS — Z79899 Other long term (current) drug therapy: Secondary | ICD-10-CM | POA: Diagnosis not present

## 2017-03-03 LAB — BASIC METABOLIC PANEL
ANION GAP: 5 (ref 5–15)
BUN: 20 mg/dL (ref 6–20)
CALCIUM: 8.7 mg/dL — AB (ref 8.9–10.3)
CHLORIDE: 106 mmol/L (ref 101–111)
CO2: 26 mmol/L (ref 22–32)
Creatinine, Ser: 1.02 mg/dL (ref 0.61–1.24)
GFR calc non Af Amer: >60 mL/min (ref >60)
Glucose, Bld: 235 mg/dL — ABNORMAL HIGH (ref 65–99)
Potassium: 3.5 mmol/L (ref 3.5–5.1)
SODIUM: 137 mmol/L (ref 135–145)

## 2017-03-03 LAB — GLUCOSE, CAPILLARY
GLUCOSE-CAPILLARY: 223 mg/dL — AB (ref 65–99)
Glucose-Capillary: 251 mg/dL — ABNORMAL HIGH (ref 65–99)
Glucose-Capillary: 195 mg/dL — ABNORMAL HIGH (ref 65–99)
Glucose-Capillary: 200 mg/dL — ABNORMAL HIGH (ref 65–99)

## 2017-03-03 LAB — CBC
HCT: 36.6 % — ABNORMAL LOW (ref 39.0–52.0)
HEMOGLOBIN: 12.1 g/dL — AB (ref 13.0–17.0)
MCH: 30.8 pg (ref 26.0–34.0)
MCHC: 33.1 g/dL (ref 30.0–36.0)
MCV: 93.1 fL (ref 78.0–100.0)
PLATELETS: 237 10*3/uL (ref 150–400)
RBC: 3.93 MIL/uL — AB (ref 4.22–5.81)
RDW: 13.9 % (ref 11.5–15.5)
WBC: 6.1 10*3/uL (ref 4.0–10.5)

## 2017-03-03 LAB — PROTIME-INR
INR: 0.96
PROTHROMBIN TIME: 12.7 s (ref 11.4–15.2)

## 2017-03-03 MED ORDER — LIDOCAINE HCL 2 % EX GEL
1.0000 "application " | Freq: Once | CUTANEOUS | Status: DC | PRN
  Filled 2017-03-03: qty 5

## 2017-03-03 MED ORDER — LIDOCAINE-EPINEPHRINE (PF) 1 %-1:200000 IJ SOLN
0.0000 mL | Freq: Once | INTRAMUSCULAR | Status: DC | PRN
  Filled 2017-03-03: qty 30

## 2017-03-03 MED ORDER — OXYMETAZOLINE HCL 0.05 % NA SOLN
1.0000 | Freq: Once | NASAL | Status: DC | PRN
  Filled 2017-03-03: qty 15

## 2017-03-03 MED ORDER — INSULIN DETEMIR 100 UNIT/ML ~~LOC~~ SOLN: 5.0000 [IU] | Freq: Every day | SUBCUTANEOUS | Status: DC

## 2017-03-03 MED ORDER — LIDOCAINE HCL 4 % EX SOLN
0.0000 mL | Freq: Once | CUTANEOUS | Status: DC | PRN
  Filled 2017-03-03: qty 50

## 2017-03-03 MED ORDER — SILVER NITRATE-POT NITRATE 75-25 % EX MISC
1.0000 | Freq: Once | CUTANEOUS | Status: DC | PRN
  Filled 2017-03-03: qty 1

## 2017-03-03 MED ORDER — TRIPLE ANTIBIOTIC 3.5-400-5000 EX OINT
1.0000 "application " | TOPICAL_OINTMENT | Freq: Once | CUTANEOUS | Status: DC | PRN
  Filled 2017-03-03: qty 1

## 2017-03-03 MED ORDER — COCAINE HCL 4 % EX SOLN
4.0000 mL | Freq: Once | CUTANEOUS | Status: AC
  Administered 2017-03-03: 4 mL via NASAL
  Filled 2017-03-03: qty 4

## 2017-03-03 NOTE — Telephone Encounter (Signed)
Form done, please scan and send.  Thanks.  

## 2017-03-03 NOTE — Consult Note (Signed)
[name], [name] 83 y.o., male [id number]     Chief Complaint:  LEFT epistaxis  HPI:  83 yo wm, HTN, daily 81 mg ASA, onset LEFT profuse nosebleed 2 days ago.  Stopped spontaneously.  Again 1 day ago.  Again today.  Rhino rocket placed with incomplete control.  No known bleeding issues.  No nasal trauma or surgery.  Breathes OK.    PMH: Past Medical History:  Diagnosis Date  . Anxiety   . Borderline hyperlipidemia   . CAD (coronary artery disease)   . Colon polyps   . Diverticulosis of colon   . DJD (degenerative joint disease)   . DM (diabetes mellitus) ([place])    Adult onset  . History of gastritis   . History of kidney stones   . History of pyelonephritis   . History of syncope   . HTN (hypertension)   . Hypertrophy of prostate with urinary obstruction and other lower urinary tract symptoms (LUTS)   . Myocardial infarction ([place])    MORE THAN 10 YRS AGO - TX'D MEDICALLY - NO STENTS     DR. WALL CARDIOLOGIST  . Myoclonus     Surg Hx: Past Surgical History:  Procedure Laterality Date  . APPENDECTOMY    . CATARACT EXTRACTION     RIGHT EYE  . CHOLECYSTECTOMY    . cystoscopy  04/17/11   stent placed  . CYSTOSCOPY W/ URETERAL STENT PLACEMENT  04/17/2011   Procedure: CYSTOSCOPY WITH RETROGRADE PYELOGRAM/URETERAL STENT PLACEMENT;  Surgeon: Hanley Ben, MD;  Location: [facility];  Service: Urology;  Laterality: Left;  . CYSTOSCOPY WITH RETROGRADE PYELOGRAM, URETEROSCOPY AND STENT PLACEMENT Left 04/18/2013   Procedure: CYSTOSCOPY WITH LEFT RETROGRADE PYELOGRAM, LEFT URETEROSCOPY AND LASER LITHOTRIPSY LEFT STENT PLACEMENT;  Surgeon: Dutch Gray, MD;  Location: [facility];  Service: Urology;  Laterality: Left;  . HOLMIUM LASER APPLICATION Left 08/13/5327   Procedure: HOLMIUM LASER APPLICATION;  Surgeon: Dutch Gray, MD;  Location: [facility];  Service: Urology;  Laterality: Left;  . IRRIGATION AND DEBRIDEMENT ABSCESS Left 10/05/2014   Procedure: IAND D LEFT INDEX FINGER;  Surgeon: Dayna Barker, MD;   Location: [facility];  Service: Plastics;  Laterality: Left;  . KNEE SURGERY     bilateral ARTHROSCOPY X2 TO EACH KNEE  . MELANOMA EXCISION  2018  . S/P cysto & stents for kidnedy stone 11/08 by Dr. Reece Agar    . 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 SHOULDER SURGERY  04/2005   left by Dr. Berenice Primas    FHx:   Family History  Problem Relation Age of Onset  . Heart attack Mother   . Dementia Brother   . Colon cancer Neg Hx   . Prostate cancer Neg Hx    SocHx:  reports that  has never smoked. he has never used smokeless tobacco. He reports that he does not drink alcohol or use drugs.  ALLERGIES:  Allergies  Allergen Reactions  . Codeine Phosphate Nausea Only    Can take with food and usually doesn't cause nausea  . Morphine Nausea Only    Can take with food and usually doesn't cause nausea  . Metformin And Related Diarrhea    Medications Prior to Admission  Medication Sig Dispense Refill  . amLODipine (NORVASC) 10 MG tablet TAKE 1 TABLET BY MOUTH  EVERY MORNING 90 tablet 3  . aspirin EC 81 MG tablet Take 81 mg by mouth every evening.     Marland Kitchen  carvedilol (COREG) 12.5 MG tablet TAKE 1 TABLET BY MOUTH 2  TIMES DAILY WITH MEALS 180 tablet 3  . COD LIVER OIL PO Take 1 capsule by mouth 2 (two) times daily.     . cyanocobalamin (,VITAMIN B-12,) 1000 MCG/ML injection Inject 1 mL (1,000 mcg total) every 8 (eight) weeks into the muscle. Dispense 6 of the 1 mL vials. 6 mL 0  . diclofenac sodium (VOLTAREN) 1 % GEL Apply 2 g 4 (four) times daily topically. On the knuckles as needed. 100 g 12  . glimepiride (AMARYL) 4 MG tablet TAKE ONE-HALF TABLET BY  MOUTH BEFORE BREAKFAST 45 tablet 3  . glucose blood (ONE TOUCH ULTRA TEST) test strip Use as instructed    . HUMALOG KWIKPEN 100 UNIT/ML KiwkPen INJECT INTO SKIN 3 TIMES DAILY BEFORE MEALS USING SLIDING SCALE. 151 TO 200 = 4 UNITS, 201 TO 250= 6 UNITS, 251 TO 300= 8 UNITS, GREATER THA 15  mL 5  . hydrALAZINE (APRESOLINE) 50 MG tablet Take 1 tablet (50 mg total) 3 (three) times daily by mouth. 270 tablet 3  . INS SYRINGE/NEEDLE 1CC/28G (B-D INSULIN SYRINGE 1CC/28G) 28G X 1/2" 1 ML MISC Use monthly with B12 shot. 10 each 0  . lisinopril-hydrochlorothiazide (PRINZIDE,ZESTORETIC) 20-25 MG tablet TAKE 1 TABLET BY MOUTH  EVERY MORNING 90 tablet 3  . meclizine (ANTIVERT) 25 MG tablet Take 0.5-1 tablets (12.5-25 mg total) by mouth 2 (two) times daily as needed for dizziness.    . meloxicam (MOBIC) 15 MG tablet Take 15 mg by mouth 2 (two) times daily as needed for pain.   2  . NOVOFINE 30G X 8 MM MISC FOR USE WITH INSULIN PENS. 100 each 3  . ONE TOUCH ULTRA TEST test strip CHECK UP TO 3 TIMES DAILY  AS NEEDED 300 each 3  . oxymetazoline (AFRIN NASAL SPRAY) 0.05 % nasal spray Place 2 sprays into both nostrils as needed for congestion (Nose bleed). 30 mL 0  . potassium chloride SA (K-DUR,KLOR-CON) 20 MEQ tablet TAKE 1 TABLET BY MOUTH  DAILY 90 tablet 3  . tamsulosin (FLOMAX) 0.4 MG CAPS capsule TAKE 1 CAPSULE BY MOUTH  DAILY AFTER SUPPER 90 capsule 3  . [name] SOLOSTAR 300 UNIT/ML SOPN INJECT 12 UNITS INTO THE SKIN AT BEDTIME. 4.5 mL 2  . traMADol (ULTRAM) 50 MG tablet Take 1 tablet (50 mg total) every 12 (twelve) hours as needed by mouth (for knee pain). 30 tablet 2    Results for orders placed or performed during the hospital encounter of 03/02/17 (from the past 48 hour(s))  CBG monitoring, ED     Status: Abnormal   Collection Time: 03/02/17  5:41 AM  Result Value Ref Range   Glucose-Capillary 366 (H) 65 - 99 mg/dL  CBC with Differential     Status: None   Collection Time: 03/02/17  7:45 AM  Result Value Ref Range   WBC 8.7 4.0 - 10.5 K/uL   RBC 4.55 4.22 - 5.81 MIL/uL   Hemoglobin 14.3 13.0 - 17.0 g/dL   HCT 41.7 39.0 - 52.0 %   MCV 91.6 78.0 - 100.0 fL   MCH 31.4 26.0 - 34.0 pg   MCHC 34.3 30.0 - 36.0 g/dL   RDW 13.8 11.5 - 15.5 %   Platelets 290 150 - 400 K/uL   Neutrophils  Relative % 68 %   Neutro Abs 5.9 1.7 - 7.7 K/uL   Lymphocytes Relative 22 %   Lymphs Abs 1.9 0.7 - 4.0 K/uL  Monocytes Relative 8 %   Monocytes Absolute 0.7 0.1 - 1.0 K/uL   Eosinophils Relative 2 %   Eosinophils Absolute 0.2 0.0 - 0.7 K/uL   Basophils Relative 0 %   Basophils Absolute 0.0 0.0 - 0.1 K/uL  Basic metabolic panel     Status: Abnormal   Collection Time: 03/02/17  7:45 AM  Result Value Ref Range   Sodium 138 135 - 145 mmol/L   Potassium 3.9 3.5 - 5.1 mmol/L   Chloride 106 101 - 111 mmol/L   CO2 24 22 - 32 mmol/L   Glucose, Bld 378 (H) 65 - 99 mg/dL   BUN 29 (H) 6 - 20 mg/dL   Creatinine, Ser 1.19 0.61 - 1.24 mg/dL   Calcium 9.6 8.9 - 10.3 mg/dL   GFR calc non Af Amer 56 (L) >60 mL/min   GFR calc Af Amer >60 >60 mL/min    Comment: (NOTE) The eGFR has been calculated using the CKD EPI equation. This calculation has not been validated in all clinical situations. eGFR's persistently <60 mL/min signify possible Chronic Kidney Disease.    Anion gap 8 5 - 15  Protime-INR     Status: None   Collection Time: 03/02/17  7:45 AM  Result Value Ref Range   Prothrombin Time 13.8 11.4 - 15.2 seconds   INR 1.07   APTT     Status: None   Collection Time: 03/02/17  7:45 AM  Result Value Ref Range   aPTT 26 24 - 36 seconds  Brain natriuretic peptide     Status: None   Collection Time: 03/02/17  7:45 AM  Result Value Ref Range   B Natriuretic Peptide 55.0 0.0 - 100.0 pg/mL  Urinalysis, Routine w reflex microscopic     Status: Abnormal   Collection Time: 03/02/17  8:15 AM  Result Value Ref Range   Color, Urine YELLOW YELLOW   APPearance CLEAR CLEAR   Specific Gravity, Urine 1.023 1.005 - 1.030   pH 6.0 5.0 - 8.0   Glucose, UA >=500 (A) NEGATIVE mg/dL   Hgb urine dipstick NEGATIVE NEGATIVE   Bilirubin Urine NEGATIVE NEGATIVE   Ketones, ur 5 (A) NEGATIVE mg/dL   Protein, ur NEGATIVE NEGATIVE mg/dL   Nitrite POSITIVE (A) NEGATIVE   Leukocytes, UA TRACE (A) NEGATIVE   RBC  / HPF 0-5 0 - 5 RBC/hpf   WBC, UA 6-30 0 - 5 WBC/hpf   Bacteria, UA MANY (A) NONE SEEN   Squamous Epithelial / LPF NONE SEEN NONE SEEN  I-Stat Troponin, ED (not at [facility])     Status: None   Collection Time: 03/02/17  8:42 AM  Result Value Ref Range   Troponin i, poc 0.02 0.00 - 0.08 ng/mL   Comment 3            Comment: Due to the release kinetics of cTnI, a negative result within the first hours of the onset of symptoms does not rule out myocardial infarction with certainty. If myocardial infarction is still suspected, repeat the test at appropriate intervals.   I-Stat CG4 Lactic Acid, ED     Status: None   Collection Time: 03/02/17  8:44 AM  Result Value Ref Range   Lactic Acid, Venous 1.22 0.5 - 1.9 mmol/L  Glucose, capillary     Status: Abnormal   Collection Time: 03/02/17  8:51 PM  Result Value Ref Range   Glucose-Capillary 271 (H) 65 - 99 mg/dL   Comment 1 Notify RN   Basic  metabolic panel     Status: Abnormal   Collection Time: 03/03/17  4:22 AM  Result Value Ref Range   Sodium 137 135 - 145 mmol/L   Potassium 3.5 3.5 - 5.1 mmol/L   Chloride 106 101 - 111 mmol/L   CO2 26 22 - 32 mmol/L   Glucose, Bld 235 (H) 65 - 99 mg/dL   BUN 20 6 - 20 mg/dL   Creatinine, Ser 1.02 0.61 - 1.24 mg/dL   Calcium 8.7 (L) 8.9 - 10.3 mg/dL   GFR calc non Af Amer >60 >60 mL/min   GFR calc Af Amer >60 >60 mL/min    Comment: (NOTE) The eGFR has been calculated using the CKD EPI equation. This calculation has not been validated in all clinical situations. eGFR's persistently <60 mL/min signify possible Chronic Kidney Disease.    Anion gap 5 5 - 15  CBC     Status: Abnormal   Collection Time: 03/03/17  4:22 AM  Result Value Ref Range   WBC 6.1 4.0 - 10.5 K/uL   RBC 3.93 (L) 4.22 - 5.81 MIL/uL   Hemoglobin 12.1 (L) 13.0 - 17.0 g/dL   HCT 36.6 (L) 39.0 - 52.0 %   MCV 93.1 78.0 - 100.0 fL   MCH 30.8 26.0 - 34.0 pg   MCHC 33.1 30.0 - 36.0 g/dL   RDW 13.9 11.5 - 15.5 %   Platelets 237  150 - 400 K/uL  Glucose, capillary     Status: Abnormal   Collection Time: 03/03/17  7:45 AM  Result Value Ref Range   Glucose-Capillary 223 (H) 65 - 99 mg/dL   Comment 1 Notify RN    Comment 2 Document in Chart    Dg Chest 2 View  Result Date: 03/02/2017 CLINICAL DATA:  83 year old male with epistaxis EXAM: CHEST  2 VIEW COMPARISON:  Prior chest x-ray 03/20/2016 FINDINGS: Stable borderline cardiomegaly. Atherosclerotic calcifications are present in the transverse aorta. No pulmonary edema, focal airspace consolidation, pleural effusion or pneumothorax. Surgical changes of prior distal left clavicle resection. Mild degenerative osteoarthritis present at the right [facility] joint. IMPRESSION: 1. Stable borderline cardiomegaly. 2. No acute cardiopulmonary process. 3.  Aortic Atherosclerosis (ICD10-170.0) Electronically Signed   By: Jacqulynn Cadet M.D.   On: 03/02/2017 08:41   Ct Head Wo Contrast  Result Date: 03/02/2017 CLINICAL DATA:  Epistaxis EXAM: CT HEAD WITHOUT CONTRAST TECHNIQUE: Contiguous axial images were obtained from the base of the skull through the vertex without intravenous contrast. COMPARISON:  01/28/2014 FINDINGS: Brain: Mild atrophic changes are noted. No findings to suggest acute hemorrhage, acute infarction or space-occupying mass lesion are noted. Vascular: Choose Skull: Normal. Negative for fracture or focal lesion. Sinuses/Orbits: The orbits and their contents are within normal limits. Paranasal sinuses are well aerated with the exception of the left maxillary antrum which demonstrates an air-fluid level within. Some central density is noted which may be related to the current history of hemorrhage. Other: None. IMPRESSION: Mild atrophic changes are noted. Air-fluid level within the left maxillary antrum likely related to the current history of hemorrhage. Electronically Signed   By: Inez Catalina M.D.   On: 03/02/2017 12:33     Blood pressure 133/61, pulse 67, temperature 97.7 F  (36.5 C), temperature source Oral, resp. rate 20, height 6' (1.829 m), weight 99.8 kg (220 lb), SpO2 99 %.  PHYSICAL EXAM: Overall appearance:  Sl blood soiling around nose.  Packing in LEFT nose Head: NCAT Ears: wax AD Nose:  After pack  removal, arteriolar pumping vessel LEFT low anterior septum.  Oral Cavity:  Teeth OK.  Sl blood in pharynx.   Oral Pharynx/Hypopharynx/Larynx:  Sl blood Neuro:  Grossly intact Neck:  No masses  Studies Reviewed:  CBC    Assessment/Plan   LEFT anterior arteriolar epistaxis, aggravated by age, winter, ASA, HTN.  Plan:  Silver nitrate cauterized with 4% cocaine topical anesthesia.  Well tolerated.  Good control.  Nasal hygiene instructions written.  Could go home today from my standpoint.  No return visit needed.    Jodi Marble 9/51/8841, 10:25 AM

## 2017-03-03 NOTE — Telephone Encounter (Signed)
°  Dr Damita Dunnings See below CRM  Does pt need to be seen by Wednesday.  You have openings on Friday is that ok?   Left message asking daughter (joy) to call office.

## 2017-03-03 NOTE — Progress Notes (Addendum)
[facility] PROGRESS NOTE    Progress Note  TIANT PEIXOTO  VHQ:[id number] DOB: [birth year] DOA: 03/02/2017 PCP: Tonia Ghent, MD     Brief Narrative:   [name] is an 83 y.o. male past medical history obesity, essential hypertension, diabetes who presents with recurrent bleeds seen on 03/01/2016 for left-sided bleed which was controlled return 24 hours after this with ongoing bleeding, Rhino Rocket placed, hemoglobin is 14 and platelets are normal UA no signs of infection CT scan of the head showed no acute intracranial abnormalities  Assessment/Plan:   Recurrent Epistaxis Status post Rhino Rocket in place homeostasis achieved, he continues to have bleeding, he woke up this morning with bleeding on the left side nose. His hemoglobin dropped from 14-12. The count is stable check a PT and INR 1 not on any oral anticoagulation at home. We will check a CBC, continue to hold aspirin, we have consulted ENT Dr. Erik Obey  Probable UTI: He was started on IV Rocephin, she was endorsing urinary frequency malaise and fatigue no flank pain culture data is pending.  Essential hypertension Seems to be controlled continue current medication.  Type 2 diabetes mellitus with vascular disease (HCC) Continue sliding scale and Levemir.  Chronic combined systolic and diastolic congestive heart failure (HCC) Seems to be euvolemic continue current medication.    DVT prophylaxis: SCD Family Communication:none Disposition Plan/Barrier to D/C: Probably home in 2-3 days. Code Status:     Code Status Orders  (From admission, onward)        Start     Ordered   03/02/17 1739  Full code  Continuous     03/02/17 1738    Code Status History    Date Active Date Inactive Code Status Order ID Comments User Context   10/05/2014 05:10 10/06/2014 18:59 Full Code [id number]  Rise Patience, MD Inpatient   01/28/2014 22:03 02/04/2014 14:11 Full Code [id number]  Toy Baker, MD  Inpatient   01/26/2014 15:06 01/28/2014 13:57 Full Code [id number]  Reyne Dumas, MD ED   01/02/2014 16:12 01/06/2014 16:41 Full Code [id number]  Modena Jansky, MD Inpatient   12/15/2013 21:08 12/17/2013 14:57 Full Code [id number]  Kinnie Feil, MD Inpatient        IV Access:    Peripheral IV   Procedures and diagnostic studies:   Dg Chest 2 View  Result Date: 03/02/2017 CLINICAL DATA:  83 year old male with epistaxis EXAM: CHEST  2 VIEW COMPARISON:  Prior chest x-ray 03/20/2016 FINDINGS: Stable borderline cardiomegaly. Atherosclerotic calcifications are present in the transverse aorta. No pulmonary edema, focal airspace consolidation, pleural effusion or pneumothorax. Surgical changes of prior distal left clavicle resection. Mild degenerative osteoarthritis present at the right [facility] joint. IMPRESSION: 1. Stable borderline cardiomegaly. 2. No acute cardiopulmonary process. 3.  Aortic Atherosclerosis (ICD10-170.0) Electronically Signed   By: Jacqulynn Cadet M.D.   On: 03/02/2017 08:41   Ct Head Wo Contrast  Result Date: 03/02/2017 CLINICAL DATA:  Epistaxis EXAM: CT HEAD WITHOUT CONTRAST TECHNIQUE: Contiguous axial images were obtained from the base of the skull through the vertex without intravenous contrast. COMPARISON:  01/28/2014 FINDINGS: Brain: Mild atrophic changes are noted. No findings to suggest acute hemorrhage, acute infarction or space-occupying mass lesion are noted. Vascular: Choose Skull: Normal. Negative for fracture or focal lesion. Sinuses/Orbits: The orbits and their contents are within normal limits. Paranasal sinuses are well aerated with the exception of the left maxillary antrum which demonstrates an air-fluid level within. Some central density is noted  which may be related to the current history of hemorrhage. [name]: None. IMPRESSION: Mild atrophic changes are noted. Air-fluid level within the left maxillary antrum likely related to the current history of  hemorrhage. Electronically Signed   By: Inez Catalina M.D.   On: 03/02/2017 12:33     Medical Consultants:    None.  Anti-Infectives:  Rocephin  Subjective:    [name] has had continued bleeding despite packing. That she has been coughing quite frequently as he feels of blood dripping down the back of his nose.   Objective:    Vitals:   03/02/17 1741 03/02/17 1827 03/02/17 2056 03/03/17 0538  BP: 138/70 (!) 157/81 (!) 145/68 133/61  Pulse: 81 76 73 67  Resp: 20 20 20 20   Temp:  98.2 F (36.8 C) 98.1 F (36.7 C) 97.7 F (36.5 C)  TempSrc:  Oral Oral Oral  SpO2: (!) 88% 96% 94% 99%  Weight:  99.8 kg (220 lb)    Height:  6' (1.829 m)      Intake/Output Summary (Last 24 hours) at 03/03/2017 0834 Last data filed at 03/03/2017 0540 Gross per 24 hour  Intake 1050 ml  Output 600 ml  Net 450 ml   Filed Weights   03/02/17 0541 03/02/17 1827  Weight: 101.2 kg (223 lb) 99.8 kg (220 lb)    Exam: General exam: In no acute distress. Respiratory system: Good air movement and clear to auscultation. Cardiovascular system: S1 & S2 heard, RRR. No JVD.  Gastrointestinal system: Abdomen is nondistended, soft and nontender.  Central nervous system: Alert and oriented. No focal neurological deficits. Extremities: No pedal edema. Skin: No rashes, lesions or ulcers Psychiatry: Judgement and insight appear normal. Mood & affect appropriate.    Data Reviewed:    Labs: Basic Metabolic Panel: Recent Labs  Lab 03/02/17 0745 03/03/17 0422  NA 138 137  K 3.9 3.5  CL 106 106  CO2 24 26  GLUCOSE 378* 235*  BUN 29* 20  CREATININE 1.19 1.02  CALCIUM 9.6 8.7*   GFR Estimated Creatinine Clearance: 70.7 mL/min (by C-G formula based on SCr of 1.02 mg/dL). Liver Function Tests: No results for input(s): AST, ALT, ALKPHOS, BILITOT, PROT, ALBUMIN in the last 168 hours. No results for input(s): LIPASE, AMYLASE in the last 168 hours. No results for input(s): AMMONIA in  the last 168 hours. Coagulation profile Recent Labs  Lab 03/02/17 0745  INR 1.07    CBC: Recent Labs  Lab 03/01/17 0645 03/02/17 0745 03/03/17 0422  WBC 8.8 8.7 6.1  NEUTROABS  --  5.9  --   HGB 15.6 14.3 12.1*  HCT 45.0 41.7 36.6*  MCV 91.3 91.6 93.1  PLT 303 290 237   Cardiac Enzymes: No results for input(s): CKTOTAL, CKMB, CKMBINDEX, TROPONINI in the last 168 hours. BNP (last 3 results) No results for input(s): PROBNP in the last 8760 hours. CBG: Recent Labs  Lab 03/02/17 0541 03/02/17 2051 03/03/17 0745  GLUCAP 366* 271* 223*   D-Dimer: No results for input(s): DDIMER in the last 72 hours. Hgb A1c: No results for input(s): HGBA1C in the last 72 hours. Lipid Profile: No results for input(s): CHOL, HDL, LDLCALC, TRIG, CHOLHDL, LDLDIRECT in the last 72 hours. Thyroid function studies: No results for input(s): TSH, T4TOTAL, T3FREE, THYROIDAB in the last 72 hours.  Invalid input(s): FREET3 Anemia work up: No results for input(s): VITAMINB12, FOLATE, FERRITIN, TIBC, IRON, RETICCTPCT in the last 72 hours. Sepsis Labs: Recent Labs  Lab  03/01/17 0645 03/02/17 0745 03/02/17 0844 03/03/17 0422  WBC 8.8 8.7  --  6.1  LATICACIDVEN  --   --  1.22  --    Microbiology No results found for this or any previous visit (from the past 240 hour(s)).   Medications:   . amLODipine  10 mg Oral q morning - 10a  . aspirin EC  81 mg Oral QPM  . carvedilol  12.5 mg Oral BID WC  . [START ON 04/13/2017] cyanocobalamin  1,000 mcg Intramuscular Q8 Weeks  . diclofenac sodium  2 g Topical QID  . feeding supplement (ENSURE ENLIVE)  237 mL Oral BID BM  . glimepiride  2 mg Oral Q breakfast  . hydrALAZINE  50 mg Oral TID  . insulin aspart  0-5 Units Subcutaneous QHS  . insulin aspart  0-9 Units Subcutaneous TID WC  . insulin aspart  2 Units Subcutaneous TID WC  . insulin glargine  12 Units Subcutaneous QHS  . potassium chloride SA  20 mEq Oral Daily  . senna  1 tablet Oral BID    . sodium chloride flush  3 mL Intravenous Q12H  . tamsulosin  0.4 mg Oral QPC supper   Continuous Infusions: . sodium chloride    . sodium chloride 50 mL/hr at 03/02/17 1906  . cefTRIAXone (ROCEPHIN)  IV        LOS: 0 days   Charlynne Cousins  [facility] Pager [phone]  *Please refer to [email or web address], password [email or web address] to get updated schedule on who will round on this patient, as hospitalists switch teams weekly. If 7PM-7AM, please contact night-coverage at [email or web address], password [email or web address] for any overnight needs.  03/03/2017, 8:34 AM

## 2017-03-03 NOTE — Progress Notes (Signed)
Nutrition Brief Note  Pt seen for MST score of 2+.   Pt reports eating 100% of meals provided since admission. Medical record only confirms 90% of breakfast consumed.   Pt reports great appetite and no recent weight loss.  Wt Readings from Last 3 Encounters:  03/02/17 220 lb (99.8 kg)  03/01/17 219 lb (99.3 kg)  12/23/16 228 lb 8 oz (103.6 kg)   Per ONS protocol, Ensure Enlive was ordered but not provided.   No nutrition concerns at this time.  If nutrition issues arise, please consult RD.   Allie Ousley, MS, Dietetic Intern Pager # [phone]

## 2017-03-03 NOTE — Telephone Encounter (Signed)
Returned to [place] for processing.

## 2017-03-03 NOTE — Evaluation (Signed)
Physical Therapy Evaluation & Discharge Patient Details Name: [name] MRN: [id number] DOB: [birth year] Today's Date: 03/03/2017   History of Present Illness  [name] is an 83 y.o. male past medical history obesity, essential hypertension, diabetes, CAD and myoclonus who presents with recurrent bleeds seen on 03/01/2016 for left-sided bleed which was controlled return 24 hours after this with ongoing bleeding, Rhino Rocket placed, hemoglobin is 14 and platelets are normal UA no signs of infection CT scan of the head showed no acute intracranial abnormalities  Clinical Impression  Patient presents with decreased independence with mobility due to imbalance, generalized weakness from hospitalization and blood loss.  Feel he can return home with intermittent family support and follow up HHPT.  Educated to use walker initially due to LOB without it.  ALSO PT REPORTS NEW NUMBNESS IN UE's.  MAY NEED FURTHER NEURO WORK UP.  No further skilled PT needs as d/c planned.  Will defer further tx to HHPT.     Follow Up Recommendations Home health PT;Supervision - Intermittent    Equipment Recommendations  None recommended by PT    Recommendations for Other Services       Precautions / Restrictions Precautions Precautions: Fall      Mobility  Bed Mobility               General bed mobility comments: up in chair  Transfers Overall transfer level: Modified independent                  Ambulation/Gait Ambulation/Gait assistance: Min assist;Supervision Ambulation Distance (Feet): 150 Feet Assistive device: None;Rolling walker (2 wheeled) Gait Pattern/deviations: Step-through pattern;Decreased stride length;Trunk flexed;Wide base of support;Staggering right     General Gait Details: LOB R x 2 when without device, with walker, more stable and no LOB noted  Stairs            Wheelchair Mobility    Modified Rankin (Stroke Patients Only)       Balance  Overall balance assessment: Needs assistance   Sitting balance-Leahy Scale: Good       Standing balance-Leahy Scale: Fair                               Pertinent Vitals/Pain Pain Assessment: No/denies pain    Home Living Family/patient expects to be discharged to:: Private residence Living Arrangements: Alone Available Help at Discharge: Family;Available PRN/intermittently Type of Home: House Home Access: Stairs to enter Entrance Stairs-Rails: Right Entrance Stairs-Number of Steps: 3 Home Layout: One level Home Equipment: Walker - 2 wheels;Cane - single point      Prior Function Level of Independence: Independent         Comments: sister lives across the street     Hand Dominance   Dominant Hand: Right    Extremity/Trunk Assessment   Upper Extremity Assessment Upper Extremity Assessment: LUE deficits/detail;RUE deficits/detail RUE Deficits / Details: reports new numbness in UE's and hands; able to grasp small tube and loosen/tighten cap, but difficulty with fingers to thumb, FNF slowed, but accurate RUE Sensation: decreased light touch LUE Deficits / Details: reports new numbness in UE's and hands; able to grasp small tube and loosen/tighten cap, but difficulty with fingers to thumb, FNF slowed, but accurate LUE Sensation: decreased light touch    Lower Extremity Assessment Lower Extremity Assessment: Overall WFL for tasks assessed    Cervical / Trunk Assessment Cervical / Trunk Assessment: Kyphotic;Lordotic  Communication  Communication: No difficulties  Cognition Arousal/Alertness: Awake/alert Behavior During Therapy: WFL for tasks assessed/performed Overall Cognitive Status: Within Functional Limits for tasks assessed                                        General Comments General comments (skin integrity, edema, etc.): Discussed need to use walker, take his time getting up, have good lighting and footwear and rely on  assistance of family initially.     Exercises     Assessment/Plan    PT Assessment All further PT needs can be met in the next venue of care  PT Problem List Decreased safety awareness;Impaired sensation;Decreased balance;Decreased activity tolerance       PT Treatment Interventions      PT Goals (Current goals can be found in the Care Plan section)  Acute Rehab PT Goals PT Goal Formulation: All assessment and education complete, DC therapy    Frequency     Barriers to discharge        Co-evaluation               AM-PAC PT "6 Clicks" Daily Activity  Outcome Measure Difficulty turning over in bed (including adjusting bedclothes, sheets and blankets)?: None Difficulty moving from lying on back to sitting on the side of the bed? : A Little   Help needed moving to and from a bed to chair (including a wheelchair)?: A Little Help needed walking in hospital room?: A Little Help needed climbing 3-5 steps with a railing? : A Little 6 Click Score: 16    End of Session Equipment Utilized During Treatment: Gait belt Activity Tolerance: Patient tolerated treatment well Patient left: in chair;with call bell/phone within reach   PT Visit Diagnosis: Other abnormalities of gait and mobility (R26.89);Other symptoms and signs involving the nervous system (R29.898)    Time: 1035-1100 PT Time Calculation (min) (ACUTE ONLY): 25 min   Charges:   PT Evaluation $PT Eval Moderate Complexity: 1 Mod PT Treatments $Gait Training: 8-22 mins   PT G CodesMagda Kiel, [place] [phone] 03/03/2017   Reginia Naas 03/03/2017, 1:58 PM

## 2017-03-03 NOTE — Care Management Obs Status (Signed)
[place] NOTIFICATION   Patient Details  Name: [name] MRN: [id number] Date of Birth: [birth year]   Medicare Observation Status Notification Given:  Yes    Lynnell Catalan, RN 03/03/2017, 1:51 PM

## 2017-03-03 NOTE — Discharge Instructions (Signed)
Frequent nasal saline spray to keep both sides of your nose moist Room humidifier Bacitracin ointment in each nostril morning and evening x 1 mo. Recheck my office 1 mo if needed, [phone] for an appointment Call for further bleeding OK to resume aspirin, diet, and normal activities

## 2017-03-03 NOTE — Telephone Encounter (Signed)
Better for OV tomorrow re: nasal packing removal.   If that can't be done, then okay to schedule with me on Thursday.  Wouldn't want until Friday.  Thanks.

## 2017-03-03 NOTE — Telephone Encounter (Signed)
PT is in the hospital. He had another nose bleed but is set up 1/24 at 4pm with Dr Damita Dunnings for hosp follow up.

## 2017-03-04 DIAGNOSIS — R040 Epistaxis: Principal | ICD-10-CM

## 2017-03-04 LAB — GLUCOSE, CAPILLARY
GLUCOSE-CAPILLARY: 222 mg/dL — AB (ref 65–99)
Glucose-Capillary: 222 mg/dL — ABNORMAL HIGH (ref 65–99)

## 2017-03-04 MED ORDER — CEFDINIR 300 MG PO CAPS: 300.0000 mg | ORAL_CAPSULE | Freq: Two times a day (BID) | ORAL | 0 refills | Status: DC

## 2017-03-04 NOTE — Care Management Note (Signed)
Case Management Note  Patient Details  Name: MARKELL SCIASCIA MRN: [id number] Date of Birth: 1934-04-13  Subjective/Objective:    83 yo admitted with Recurrent epistaxis                Action/Plan: From home alone. Pt states his sister lives next to him and he has a daughter that supports him as well. Choice offered for home health services and [facility] chosen. [facility] rep contacted for referral. Pt states he has a walker and BSC at home.  Expected Discharge Date:  (unknown)               Expected Discharge Plan:  [place]  In-House Referral:     Discharge planning Services  CM Consult  Post Acute Care Choice:  Home Health Choice offered to:  Patient  DME Arranged:    DME Agency:     HH Arranged:  PT [place]:  [place]  Status of Service:  In process, will continue to follow  If discussed at Long Length of Stay Meetings, dates discussed:    Additional CommentsLynnell Catalan, RN 03/04/2017, 9:46 AM  [phone]

## 2017-03-04 NOTE — Telephone Encounter (Signed)
Noted. Thanks.

## 2017-03-04 NOTE — Discharge Summary (Signed)
Physician Discharge Summary  DENO SIDA WVP:[id number] DOB: Jul 23, 1934 DOA: 03/02/2017  PCP: Tonia Ghent, MD  Admit date: 03/02/2017 Discharge date: 03/04/2017  Admitted From: Home Disposition:  [facility]:Yes  Discharge Condition:Stable CODE STATUS:Full  Diet recommendation: Heart Healthy  Brief/Interim Summary:   [name]  is a 82 y.o. male with Pmhx relevant for obesity, hypertension diabetes who presents with recurrent nosebleeds.  She was seen on 03/01/2017 with left-sided nosebleeds, was given Afrin nosebleed stopped.  Returned 24 hours later on 03/02/2017 again with nosebleeds. Rhino Rocket placed in order to achieve hemostasis.He was also evaluated by ENT on this admission. Found to have LEFT anterior arteriolar epistaxis,likely  aggravated by dry nose and  HTN.Underwent Silver nitrate cauterization with 4% cocaine topical anesthesia His nose bleeding had stopped.   Patient's urinalysis was abnormal.  Urine culture sent grew [place] .  He was started on ceftriaxone here.  Patient will be discharged to home on Omnicef. Nose bleeding had stopped.  He has been stable enough to go home today.  He will follow-up with ENT as an outpatient.  Following problems were addressed during his hospitalization:  Recurrent Epistaxis Left sided. Status post Rhino Rocket in place homeostasis achieved. His hemoglobin dropped from 14-12. The count is stable check a PT and INR 1 not on any oral anticoagulation at home. Patient seen by ENT Dr. Erik Obey.  ENT cleared him for discharge.  He will follow-up as an outpatient.  Probable UTI: He was started on IV Rocephin.Urine culture grew CITROBACTER FREUNDII .discharged on Omnicef.  We will follow-up susceptibilities.  Essential hypertension Seems to be controlled ,continue current medication.  Type 2 diabetes mellitus with vascular disease ([place]) Resume home regimen  Chronic combined systolic and diastolic  congestive heart failure (HCC) Seems to be euvolemic, continue current medication.        Discharge Diagnoses:  Principal Problem:   Recurrent Epistaxis Active Problems:   Essential hypertension   Type 2 diabetes mellitus with vascular disease ([place])   UTI (urinary tract infection)   Chronic combined systolic and diastolic congestive heart failure [facility])    Discharge Instructions  Discharge Instructions    Diet - low sodium heart healthy   Complete by:  As directed    Discharge instructions   Complete by:  As directed    1) Follow up with you PCP in a week. 2) Follow up with ENT as an outpatient.  Name and number of the provider has been attached. 3) Take prescribed medications as instructed.   Increase activity slowly   Complete by:  As directed      Allergies as of 03/04/2017      Reactions   Codeine Phosphate Nausea Only   Can take with food and usually doesn't cause nausea   Morphine Nausea Only   Can take with food and usually doesn't cause nausea   Metformin And Related Diarrhea      Medication List    STOP taking these medications   meloxicam 15 MG tablet Commonly known as:  MOBIC     TAKE these medications   amLODipine 10 MG tablet Commonly known as:  NORVASC TAKE 1 TABLET BY MOUTH  EVERY MORNING   aspirin EC 81 MG tablet Take 81 mg by mouth every evening.   carvedilol 12.5 MG tablet Commonly known as:  COREG TAKE 1 TABLET BY MOUTH 2  TIMES DAILY WITH MEALS   cefdinir 300 MG capsule Commonly known as:  OMNICEF Take  1 capsule (300 mg total) by mouth 2 (two) times daily.   COD LIVER OIL PO Take 1 capsule by mouth 2 (two) times daily.   cyanocobalamin 1000 MCG/ML injection Commonly known as:  (VITAMIN B-12) Inject 1 mL (1,000 mcg total) every 8 (eight) weeks into the muscle. Dispense 6 of the 1 mL vials.   diclofenac sodium 1 % Gel Commonly known as:  VOLTAREN Apply 2 g 4 (four) times daily topically. On the knuckles as needed.    glimepiride 4 MG tablet Commonly known as:  AMARYL TAKE ONE-HALF TABLET BY  MOUTH BEFORE BREAKFAST   HUMALOG KWIKPEN 100 UNIT/ML KiwkPen Generic drug:  insulin lispro INJECT INTO SKIN 3 TIMES DAILY BEFORE MEALS USING SLIDING SCALE. 151 TO 200 = 4 UNITS, 201 TO 250= 6 UNITS, 251 TO 300= 8 UNITS, GREATER THA   hydrALAZINE 50 MG tablet Commonly known as:  APRESOLINE Take 1 tablet (50 mg total) 3 (three) times daily by mouth.   INS SYRINGE/NEEDLE 1CC/28G 28G X 1/2" 1 ML Misc Commonly known as:  B-D INSULIN SYRINGE 1CC/28G Use monthly with B12 shot.   lisinopril-hydrochlorothiazide 20-25 MG tablet Commonly known as:  PRINZIDE,ZESTORETIC TAKE 1 TABLET BY MOUTH  EVERY MORNING   meclizine 25 MG tablet Commonly known as:  ANTIVERT Take 0.5-1 tablets (12.5-25 mg total) by mouth 2 (two) times daily as needed for dizziness.   NOVOFINE 30G X 8 MM Misc Generic drug:  Insulin Pen Needle FOR USE WITH INSULIN PENS.   ONE TOUCH ULTRA TEST test strip Generic drug:  glucose blood Use as instructed   ONE TOUCH ULTRA TEST test strip Generic drug:  glucose blood CHECK UP TO 3 TIMES DAILY  AS NEEDED   oxymetazoline 0.05 % nasal spray Commonly known as:  AFRIN NASAL SPRAY Place 2 sprays into both nostrils as needed for congestion (Nose bleed).   potassium chloride SA 20 MEQ tablet Commonly known as:  K-DUR,KLOR-CON TAKE 1 TABLET BY MOUTH  DAILY   tamsulosin 0.4 MG Caps capsule Commonly known as:  FLOMAX TAKE 1 CAPSULE BY MOUTH  DAILY AFTER SUPPER   [name] SOLOSTAR 300 UNIT/ML Sopn Generic drug:  Insulin Glargine INJECT 12 UNITS INTO THE SKIN AT BEDTIME.   traMADol 50 MG tablet Commonly known as:  ULTRAM Take 1 tablet (50 mg total) every 12 (twelve) hours as needed by mouth (for knee pain).      Follow-up Information    Jodi Marble, MD Follow up.   Specialty:  Otolaryngology Why:  if needed Contact information: [place]  [address] [phone]        [facility] Follow up.   Specialty:  Home Health Services Contact information: [place] [address] [phone]        Tonia Ghent, MD. Schedule an appointment as soon as possible for a visit in 1 week(s).   Specialty:  Family Medicine Contact information: [place] [address] [phone]          Allergies  Allergen Reactions  . Codeine Phosphate Nausea Only    Can take with food and usually doesn't cause nausea  . Morphine Nausea Only    Can take with food and usually doesn't cause nausea  . Metformin And Related Diarrhea    Consultations: ENT  Procedures/Studies: Dg Chest 2 View  Result Date: 03/02/2017 CLINICAL DATA:  83 year old male with epistaxis EXAM: CHEST  2 VIEW COMPARISON:  Prior chest x-ray 03/20/2016 FINDINGS: Stable borderline cardiomegaly. Atherosclerotic calcifications are present in the transverse aorta. No pulmonary edema, focal airspace consolidation, pleural effusion or pneumothorax. Surgical changes of prior distal left clavicle resection. Mild degenerative osteoarthritis present at the right [facility] joint. IMPRESSION: 1. Stable borderline cardiomegaly. 2. No acute cardiopulmonary process. 3.  Aortic Atherosclerosis (ICD10-170.0) Electronically Signed   By: Jacqulynn Cadet M.D.   On: 03/02/2017 08:41   Ct Head Wo Contrast  Result Date: 03/02/2017 CLINICAL DATA:  Epistaxis EXAM: CT HEAD WITHOUT CONTRAST TECHNIQUE: Contiguous axial images were obtained from the base of the skull through the vertex without intravenous contrast. COMPARISON:  01/28/2014 FINDINGS: Brain: Mild atrophic changes are noted. No findings to suggest acute hemorrhage, acute infarction or space-occupying mass lesion are noted. Vascular: Choose Skull: Normal. Negative for fracture or focal lesion. Sinuses/Orbits: The orbits and their contents are within normal limits. Paranasal sinuses  are well aerated with the exception of the left maxillary antrum which demonstrates an air-fluid level within. Some central density is noted which may be related to the current history of hemorrhage. Other: None. IMPRESSION: Mild atrophic changes are noted. Air-fluid level within the left maxillary antrum likely related to the current history of hemorrhage. Electronically Signed   By: Inez Catalina M.D.   On: 03/02/2017 12:33    (Echo, Carotid, EGD, Colonoscopy, ERCP)    Subjective: Patient seen and examined the bedside.  Does not have any nose bleeding at present.  Breathing normally.  Noses are not  blocked  Discharge Exam: Vitals:   03/03/17 2318 03/04/17 0545  BP: 139/60 (!) 161/78  Pulse: 71 74  Resp: 20 16  Temp: 98.4 F (36.9 C) 98.9 F (37.2 C)  SpO2: 94% 95%   Vitals:   03/03/17 1027 03/03/17 1534 03/03/17 2318 03/04/17 0545  BP: (!) 152/78 138/66 139/60 (!) 161/78  Pulse: 71 65 71 74  Resp:  20 20 16   Temp:  98.1 F (36.7 C) 98.4 F (36.9 C) 98.9 F (37.2 C)  TempSrc:  Oral Oral Oral  SpO2:  100% 94% 95%  Weight:      Height:        General: Pt is alert, awake, not in acute distress Cardiovascular: RRR, S1/S2 +, no rubs, no gallops Respiratory: CTA bilaterally, no wheezing, no rhonchi Abdominal: Soft, NT, ND, bowel sounds + Extremities: no edema, no cyanosis    The results of significant diagnostics from this hospitalization (including imaging, microbiology, ancillary and laboratory) are listed below for reference.     Microbiology: Recent Results (from the past 240 hour(s))  Urine Culture     Status: Abnormal (Preliminary result)   Collection Time: 03/02/17  8:15 AM  Result Value Ref Range Status   Specimen Description URINE, CLEAN CATCH  Final   Special Requests Normal  Final   Culture (A)  Final    >=100,000 COLONIES/mL CITROBACTER FREUNDII SUSCEPTIBILITIES TO FOLLOW Performed at [facility], [address], [place], [place] [address]     Report Status PENDING  Incomplete     Labs: BNP (last 3 results) Recent Labs    03/02/17 0745  BNP 97.3   Basic Metabolic Panel: Recent Labs  Lab 03/02/17 0745 03/03/17 0422  NA 138 137  K 3.9 3.5  CL 106 106  CO2 24 26  GLUCOSE 378* 235*  BUN 29* 20  CREATININE 1.19 1.02  CALCIUM 9.6 8.7*   Liver Function Tests: No results for input(s): AST, ALT, ALKPHOS, BILITOT, PROT,  ALBUMIN in the last 168 hours. No results for input(s): LIPASE, AMYLASE in the last 168 hours. No results for input(s): AMMONIA in the last 168 hours. CBC: Recent Labs  Lab 03/01/17 0645 03/02/17 0745 03/03/17 0422  WBC 8.8 8.7 6.1  NEUTROABS  --  5.9  --   HGB 15.6 14.3 12.1*  HCT 45.0 41.7 36.6*  MCV 91.3 91.6 93.1  PLT 303 290 237   Cardiac Enzymes: No results for input(s): CKTOTAL, CKMB, CKMBINDEX, TROPONINI in the last 168 hours. BNP: Invalid input(s): POCBNP CBG: Recent Labs  Lab 03/03/17 1318 03/03/17 1721 03/03/17 2301 03/04/17 0726 03/04/17 1158  GLUCAP 251* 195* 200* 222* 222*   D-Dimer No results for input(s): DDIMER in the last 72 hours. Hgb A1c No results for input(s): HGBA1C in the last 72 hours. Lipid Profile No results for input(s): CHOL, HDL, LDLCALC, TRIG, CHOLHDL, LDLDIRECT in the last 72 hours. Thyroid function studies No results for input(s): TSH, T4TOTAL, T3FREE, THYROIDAB in the last 72 hours.  Invalid input(s): FREET3 Anemia work up No results for input(s): VITAMINB12, FOLATE, FERRITIN, TIBC, IRON, RETICCTPCT in the last 72 hours. Urinalysis    Component Value Date/Time   COLORURINE YELLOW 03/02/2017 0815   APPEARANCEUR CLEAR 03/02/2017 0815   LABSPEC 1.023 03/02/2017 0815   PHURINE 6.0 03/02/2017 0815   GLUCOSEU >=500 (A) 03/02/2017 0815   HGBUR NEGATIVE 03/02/2017 0815   BILIRUBINUR NEGATIVE 03/02/2017 0815   KETONESUR 5 (A) 03/02/2017 0815   PROTEINUR NEGATIVE 03/02/2017 0815   UROBILINOGEN 0.2 02/02/2014 1509   NITRITE POSITIVE (A) 03/02/2017  0815   LEUKOCYTESUR TRACE (A) 03/02/2017 0815   Sepsis Labs Invalid input(s): PROCALCITONIN,  WBC,  LACTICIDVEN Microbiology Recent Results (from the past 240 hour(s))  Urine Culture     Status: Abnormal (Preliminary result)   Collection Time: 03/02/17  8:15 AM  Result Value Ref Range Status   Specimen Description URINE, CLEAN CATCH  Final   Special Requests Normal  Final   Culture (A)  Final    >=100,000 COLONIES/mL CITROBACTER FREUNDII SUSCEPTIBILITIES TO FOLLOW Performed at [facility], [place] [address] [place], [place] [address]    Report Status PENDING  Incomplete     Time coordinating discharge: Over 30 minutes  SIGNED:   Marene Lenz, MD  [facility] 03/04/2017, 12:52 PM Pager [phone]  If 7PM-7AM, please contact night-coverage [email or web address] Password [email or web address]

## 2017-03-05 ENCOUNTER — Ambulatory Visit (INDEPENDENT_AMBULATORY_CARE_PROVIDER_SITE_OTHER): Payer: Medicare Other | Admitting: Family Medicine

## 2017-03-05 ENCOUNTER — Telehealth: Payer: Self-pay | Admitting: Family Medicine

## 2017-03-05 ENCOUNTER — Encounter: Payer: Self-pay | Admitting: Family Medicine

## 2017-03-05 VITALS — BP 142/80 | HR 91 | Temp 98.0°F | Wt 229.2 lb

## 2017-03-05 DIAGNOSIS — R040 Epistaxis: Secondary | ICD-10-CM

## 2017-03-05 DIAGNOSIS — N390 Urinary tract infection, site not specified: Secondary | ICD-10-CM

## 2017-03-05 DIAGNOSIS — R2681 Unsteadiness on feet: Secondary | ICD-10-CM | POA: Diagnosis not present

## 2017-03-05 LAB — URINE CULTURE: SPECIAL REQUESTS: NORMAL

## 2017-03-05 NOTE — Progress Notes (Signed)
Hospital follow up.    Admitted with epistaxis.  Required packing.  Continue to have bleeding and eventually needed cautery via ENT.  He did have drop in hemoglobin noted..  Was able to be discharged.  He presents today for follow-up.  No more nasal bleeding in the meantime.  No fevers.  No chills.  He feels back to baseline.  He previously had urinary frequency with urgency, more in the days before and during admission.  On flomax at baseline. Ucx positive, treated in the meantime.  He is improved in the meantime.  No fevers.  No chills.  No dysuria now.  He had some black stools but also swallowed a lot of blood with the prev events.    H/o falls, unsteady gait.  He has advance home care set up.  Discussed with patient about physical therapy at home.  He does have a previous fall.  He admits his gait is not steady.  PMH and SH reviewed  ROS: Per HPI unless specifically indicated in ROS section   Meds, vitals, and allergies reviewed.   GEN: nad, alert and oriented HEENT: mucous membranes moist, TM wnl,  L nostril scabbed but not bleeding.  Right nostril normal.  Oropharynx within normal limits.  No gross bloody discharge. NECK: supple w/o LA CV: rrr. PULM: ctab, no inc wob ABD: soft, +bs EXT: no edema Gait is mildly unsteady but symmetric.  No focal leg weakness.

## 2017-03-05 NOTE — Patient Instructions (Addendum)
If you keep having bleeding or black stools then stop the aspirin and update me.  Take care.  Glad to see you.  Go to the lab on the way out.  We'll contact you with your lab report. Get back to using your cane.

## 2017-03-05 NOTE — Telephone Encounter (Signed)
Pt daughter Anastasio Auerbach brought FMLA paperwork for errors to be corrected and newly competed forms to be faxed [place] placed in provideer RX tower.

## 2017-03-06 ENCOUNTER — Encounter: Payer: Self-pay | Admitting: Family Medicine

## 2017-03-06 LAB — CBC WITH DIFFERENTIAL/PLATELET
Basophils Absolute: 0 10*3/uL (ref 0.0–0.1)
Basophils Relative: 0.4 % (ref 0.0–3.0)
EOS ABS: 0.5 10*3/uL (ref 0.0–0.7)
Eosinophils Relative: 6 % — ABNORMAL HIGH (ref 0.0–5.0)
HCT: 40.1 % (ref 39.0–52.0)
HEMOGLOBIN: 13.7 g/dL (ref 13.0–17.0)
Lymphocytes Relative: 26.2 % (ref 12.0–46.0)
Lymphs Abs: 2.2 10*3/uL (ref 0.7–4.0)
MCHC: 34.1 g/dL (ref 30.0–36.0)
MCV: 92.7 fl (ref 78.0–100.0)
MONO ABS: 1 10*3/uL (ref 0.1–1.0)
Monocytes Relative: 11.6 % (ref 3.0–12.0)
Neutro Abs: 4.6 10*3/uL (ref 1.4–7.7)
Neutrophils Relative %: 55.8 % (ref 43.0–77.0)
PLATELETS: 309 10*3/uL (ref 150.0–400.0)
RBC: 4.33 Mil/uL (ref 4.22–5.81)
RDW: 14.1 % (ref 11.5–15.5)
WBC: 8.3 10*3/uL (ref 4.0–10.5)

## 2017-03-06 NOTE — Telephone Encounter (Signed)
I'll work on the hard copy.  Thanks.  

## 2017-03-06 NOTE — Assessment & Plan Note (Signed)
He had swallowed some blood and that likely explains the black stools.  Recheck hemoglobin today.  No other known new bleeding.  Nasal exam is scabbed as expected.  Okay for outpatient follow-up.  If he has no other symptoms then he will likely not need ENT follow-up. >25 minutes spent in face to face time with patient, >50% spent in counselling or coordination of care, discussing urinary tract infection, epistaxis, and physical therapy for gait, hospital course, etc.

## 2017-03-06 NOTE — Assessment & Plan Note (Signed)
Urine culture discussed with patient.  Improved.  Finish current prescription for [employer].  Update me as needed he agrees.

## 2017-03-06 NOTE — Assessment & Plan Note (Signed)
With fall history noted.  Refer for PT.  Patient agrees.  Okay for outpatient follow-up.

## 2017-03-06 NOTE — Telephone Encounter (Signed)
Placed in Dr. Duncan's In Box. 

## 2017-03-07 DIAGNOSIS — N390 Urinary tract infection, site not specified: Secondary | ICD-10-CM | POA: Diagnosis not present

## 2017-03-07 DIAGNOSIS — Z794 Long term (current) use of insulin: Secondary | ICD-10-CM | POA: Diagnosis not present

## 2017-03-07 DIAGNOSIS — M1990 Unspecified osteoarthritis, unspecified site: Secondary | ICD-10-CM | POA: Diagnosis not present

## 2017-03-07 DIAGNOSIS — I5042 Chronic combined systolic (congestive) and diastolic (congestive) heart failure: Secondary | ICD-10-CM | POA: Diagnosis not present

## 2017-03-07 DIAGNOSIS — Z7982 Long term (current) use of aspirin: Secondary | ICD-10-CM | POA: Diagnosis not present

## 2017-03-07 DIAGNOSIS — E119 Type 2 diabetes mellitus without complications: Secondary | ICD-10-CM | POA: Diagnosis not present

## 2017-03-07 DIAGNOSIS — K5730 Diverticulosis of large intestine without perforation or abscess without bleeding: Secondary | ICD-10-CM | POA: Diagnosis not present

## 2017-03-07 DIAGNOSIS — I110 Hypertensive heart disease with heart failure: Secondary | ICD-10-CM | POA: Diagnosis not present

## 2017-03-07 DIAGNOSIS — E785 Hyperlipidemia, unspecified: Secondary | ICD-10-CM | POA: Diagnosis not present

## 2017-03-07 DIAGNOSIS — I2510 Atherosclerotic heart disease of native coronary artery without angina pectoris: Secondary | ICD-10-CM | POA: Diagnosis not present

## 2017-03-09 ENCOUNTER — Telehealth: Payer: Self-pay | Admitting: Family Medicine

## 2017-03-09 NOTE — Telephone Encounter (Signed)
PT order had already been placed for this patient. Called Kelly back and gave verbal orders to proceed with Home PT. Also sent message to Darlina Guys that the referral is in.

## 2017-03-09 NOTE — Telephone Encounter (Signed)
Copied from [place] [phone]. Topic: General - Other >> Mar 09, 2017  1:54 PM Conception Chancy, NT wrote: Claiborne Billings is calling from [facility] is wanting verbal orders for once a week for 4 weeks for physical therapy.   [phone]

## 2017-03-10 NOTE — Telephone Encounter (Signed)
See below. Marland Kitchen FYI to [place]- I don't we have to contact them again about this.

## 2017-03-12 DIAGNOSIS — M1990 Unspecified osteoarthritis, unspecified site: Secondary | ICD-10-CM | POA: Diagnosis not present

## 2017-03-12 DIAGNOSIS — E119 Type 2 diabetes mellitus without complications: Secondary | ICD-10-CM | POA: Diagnosis not present

## 2017-03-12 DIAGNOSIS — N390 Urinary tract infection, site not specified: Secondary | ICD-10-CM | POA: Diagnosis not present

## 2017-03-12 DIAGNOSIS — K5730 Diverticulosis of large intestine without perforation or abscess without bleeding: Secondary | ICD-10-CM | POA: Diagnosis not present

## 2017-03-12 DIAGNOSIS — E785 Hyperlipidemia, unspecified: Secondary | ICD-10-CM | POA: Diagnosis not present

## 2017-03-12 DIAGNOSIS — I2510 Atherosclerotic heart disease of native coronary artery without angina pectoris: Secondary | ICD-10-CM | POA: Diagnosis not present

## 2017-03-12 DIAGNOSIS — I5042 Chronic combined systolic (congestive) and diastolic (congestive) heart failure: Secondary | ICD-10-CM | POA: Diagnosis not present

## 2017-03-12 DIAGNOSIS — Z7982 Long term (current) use of aspirin: Secondary | ICD-10-CM | POA: Diagnosis not present

## 2017-03-12 DIAGNOSIS — I110 Hypertensive heart disease with heart failure: Secondary | ICD-10-CM | POA: Diagnosis not present

## 2017-03-12 DIAGNOSIS — Z794 Long term (current) use of insulin: Secondary | ICD-10-CM | POA: Diagnosis not present

## 2017-03-13 ENCOUNTER — Telehealth: Payer: Self-pay | Admitting: Family Medicine

## 2017-03-13 NOTE — Telephone Encounter (Signed)
Done, thanks

## 2017-03-13 NOTE — Telephone Encounter (Signed)
Patient advised. Left at front desk for pickup. 

## 2017-03-13 NOTE — Telephone Encounter (Signed)
Copied from [place] [phone]. Topic: General - Other >> Mar 13, 2017  3:18 PM Lolita Rieger, [place] wrote: Reason for CRM: pt daughter called and wanted to know if she could have a paper signed for a handicap sticker for her father  [phone] is her number her name is [name]

## 2017-03-13 NOTE — Telephone Encounter (Signed)
Application for disability parking placard is in Dr Josefine Class in box if needed.

## 2017-03-19 DIAGNOSIS — N390 Urinary tract infection, site not specified: Secondary | ICD-10-CM | POA: Diagnosis not present

## 2017-03-19 DIAGNOSIS — I5042 Chronic combined systolic (congestive) and diastolic (congestive) heart failure: Secondary | ICD-10-CM | POA: Diagnosis not present

## 2017-03-19 DIAGNOSIS — E119 Type 2 diabetes mellitus without complications: Secondary | ICD-10-CM | POA: Diagnosis not present

## 2017-03-19 DIAGNOSIS — K5730 Diverticulosis of large intestine without perforation or abscess without bleeding: Secondary | ICD-10-CM | POA: Diagnosis not present

## 2017-03-19 DIAGNOSIS — I110 Hypertensive heart disease with heart failure: Secondary | ICD-10-CM | POA: Diagnosis not present

## 2017-03-19 DIAGNOSIS — Z794 Long term (current) use of insulin: Secondary | ICD-10-CM | POA: Diagnosis not present

## 2017-03-19 DIAGNOSIS — Z7982 Long term (current) use of aspirin: Secondary | ICD-10-CM | POA: Diagnosis not present

## 2017-03-19 DIAGNOSIS — E785 Hyperlipidemia, unspecified: Secondary | ICD-10-CM | POA: Diagnosis not present

## 2017-03-19 DIAGNOSIS — I2510 Atherosclerotic heart disease of native coronary artery without angina pectoris: Secondary | ICD-10-CM | POA: Diagnosis not present

## 2017-03-19 DIAGNOSIS — M1990 Unspecified osteoarthritis, unspecified site: Secondary | ICD-10-CM | POA: Diagnosis not present

## 2017-04-14 DIAGNOSIS — I110 Hypertensive heart disease with heart failure: Secondary | ICD-10-CM | POA: Diagnosis not present

## 2017-04-14 DIAGNOSIS — E119 Type 2 diabetes mellitus without complications: Secondary | ICD-10-CM | POA: Diagnosis not present

## 2017-04-14 DIAGNOSIS — I5042 Chronic combined systolic (congestive) and diastolic (congestive) heart failure: Secondary | ICD-10-CM | POA: Diagnosis not present

## 2017-04-14 DIAGNOSIS — N390 Urinary tract infection, site not specified: Secondary | ICD-10-CM | POA: Diagnosis not present

## 2017-04-16 ENCOUNTER — Other Ambulatory Visit: Payer: Self-pay | Admitting: Family Medicine

## 2017-04-21 DIAGNOSIS — H26491 Other secondary cataract, right eye: Secondary | ICD-10-CM | POA: Diagnosis not present

## 2017-04-21 DIAGNOSIS — H40013 Open angle with borderline findings, low risk, bilateral: Secondary | ICD-10-CM | POA: Diagnosis not present

## 2017-04-21 DIAGNOSIS — Z961 Presence of intraocular lens: Secondary | ICD-10-CM | POA: Diagnosis not present

## 2017-04-21 DIAGNOSIS — E119 Type 2 diabetes mellitus without complications: Secondary | ICD-10-CM | POA: Diagnosis not present

## 2017-04-21 DIAGNOSIS — H25812 Combined forms of age-related cataract, left eye: Secondary | ICD-10-CM | POA: Diagnosis not present

## 2017-04-21 LAB — HM DIABETES EYE EXAM

## 2017-04-23 ENCOUNTER — Telehealth: Payer: Self-pay | Admitting: Family Medicine

## 2017-04-23 NOTE — Telephone Encounter (Signed)
Pts sister dropped off a request for a disability parking permit on behalf of her brother. Placed in Rx tower.

## 2017-04-23 NOTE — Telephone Encounter (Signed)
Form placed in Dr. Duncan's In Box. 

## 2017-04-24 NOTE — Telephone Encounter (Signed)
Sister advised and form left at front desk for pickup.

## 2017-04-24 NOTE — Telephone Encounter (Signed)
Form done. Thanks. 

## 2017-05-06 DIAGNOSIS — M1712 Unilateral primary osteoarthritis, left knee: Secondary | ICD-10-CM | POA: Diagnosis not present

## 2017-05-06 DIAGNOSIS — M1711 Unilateral primary osteoarthritis, right knee: Secondary | ICD-10-CM | POA: Diagnosis not present

## 2017-05-12 ENCOUNTER — Telehealth: Payer: Self-pay | Admitting: Family Medicine

## 2017-05-12 DIAGNOSIS — M19042 Primary osteoarthritis, left hand: Secondary | ICD-10-CM | POA: Diagnosis not present

## 2017-05-12 NOTE — Telephone Encounter (Signed)
Copied from [place]. Topic: Quick Communication - See Telephone Encounter >> May 12, 2017  1:32 PM Vernona Rieger wrote: CRM for notification. See Telephone encounter for: 05/12/17.  Jody from [facility] needs the last bp reading and heart rate with the date for him. Please call her and leave on her voicemail. She also needs the last pneumonia shot/date.  Call back is [phone] ext [phone]

## 2017-05-12 NOTE — Telephone Encounter (Signed)
Lm on jody's vm and provided requested info. Requested a cb should she have any additional questions

## 2017-05-14 ENCOUNTER — Other Ambulatory Visit: Payer: Self-pay | Admitting: Family Medicine

## 2017-05-19 ENCOUNTER — Encounter: Payer: Self-pay | Admitting: Family Medicine

## 2017-06-22 ENCOUNTER — Encounter: Payer: Self-pay | Admitting: Family Medicine

## 2017-06-22 ENCOUNTER — Ambulatory Visit (INDEPENDENT_AMBULATORY_CARE_PROVIDER_SITE_OTHER): Payer: Medicare Other | Admitting: Family Medicine

## 2017-06-22 DIAGNOSIS — E538 Deficiency of other specified B group vitamins: Secondary | ICD-10-CM | POA: Diagnosis not present

## 2017-06-22 DIAGNOSIS — E119 Type 2 diabetes mellitus without complications: Secondary | ICD-10-CM

## 2017-06-22 DIAGNOSIS — E1159 Type 2 diabetes mellitus with other circulatory complications: Secondary | ICD-10-CM | POA: Diagnosis not present

## 2017-06-22 DIAGNOSIS — M170 Bilateral primary osteoarthritis of knee: Secondary | ICD-10-CM | POA: Diagnosis not present

## 2017-06-22 LAB — HEMOGLOBIN A1C: HEMOGLOBIN A1C: 10.5 % — AB (ref 4.6–6.5)

## 2017-06-22 LAB — TSH: TSH: 3.64 u[IU]/mL (ref 0.35–4.50)

## 2017-06-22 LAB — VITAMIN B12: VITAMIN B 12: 302 pg/mL (ref 211–911)

## 2017-06-22 NOTE — Progress Notes (Signed)
Diabetes:  Using medications without difficulties:yes Hypoglycemic episodes: only if prolonged fasting and working outside for a long period of time.  Resolved with a snack.  D/w pt about a preemptive snack.   Hyperglycemic episodes:no Feet problems: no Blood Sugars averaging: ~200s eye exam within last year: yes Due for labs.  D/w pt.    He saw ortho in the meantime and is considering options re: arthritis.  He may opt for knee replacement in the wintertime.   He is putting up with it in the meantime, with occ aleve use.    Meds, vitals, and allergies reviewed.   ROS: Per HPI unless specifically indicated in ROS section   GEN: nad, alert and oriented HEENT: mucous membranes moist NECK: supple w/o LA CV: rrr. PULM: ctab, no inc wob ABD: soft, +bs EXT: no edema SKIN: no acute rash  Diabetic foot exam: Normal inspection No skin breakdown No calluses  Normal DP pulses Normal sensation to light touch and monofilament Nails thickened.

## 2017-06-22 NOTE — Patient Instructions (Addendum)
Go to the lab on the way out.  We'll contact you with your lab report. Take care.  Glad to see you.  Don't change your meds for now.  Let me see your labs and we'll go from there.

## 2017-06-23 NOTE — Assessment & Plan Note (Signed)
See notes on labs. 

## 2017-06-23 NOTE — Assessment & Plan Note (Signed)
He is considering options.  He rarely takes Aleve.  I will defer to patient and orthopedics.  He agrees.

## 2017-06-23 NOTE — Assessment & Plan Note (Signed)
He feels good but he has elevated blood sugars on home checks recently.  Recheck labs and we will address at that point.  He agrees.

## 2017-06-26 ENCOUNTER — Telehealth: Payer: Self-pay | Admitting: Family Medicine

## 2017-06-26 NOTE — Telephone Encounter (Signed)
Patient called and he says he needs to make an appointment for a 3 month follow up. Appointment scheduled for Monday, 09/28/17 at 0945, advised to be fasting for labs, he verbalized understanding.

## 2017-06-26 NOTE — Telephone Encounter (Signed)
Reviewed lab results and physician's note with patient. He verbalized understanding Insulin instructions.

## 2017-06-26 NOTE — Telephone Encounter (Signed)
Copied from [place] [phone]. Topic: Quick Communication - Lab Results >> Jun 25, 2017  6:10 PM Josetta Huddle, [place] wrote: Called patient to inform them of  lab results. When patient returns call, triage nurse may disclose results.  See result note.

## 2017-07-02 ENCOUNTER — Other Ambulatory Visit: Payer: Self-pay | Admitting: Family Medicine

## 2017-08-08 ENCOUNTER — Other Ambulatory Visit: Payer: Self-pay | Admitting: Family Medicine

## 2017-09-24 ENCOUNTER — Telehealth: Payer: Self-pay

## 2017-09-24 NOTE — Telephone Encounter (Signed)
PLEASE NOTE: All timestamps contained within this report are represented as [place] Standard Time. CONFIDENTIALTY NOTICE: This fax transmission is intended only for the addressee. It contains information that is legally privileged, confidential or otherwise protected from use or disclosure. If you are not the intended recipient, you are strictly prohibited from reviewing, disclosing, copying using or disseminating any of this information or taking any action in reliance on or regarding this information. If you have received this fax in error, please notify us immediately by telephone so that we can arrange for its return to [place]. Phone: [phone], Toll-Free: [phone], Fax: [phone] Page: 1 of 1 Call Id: [id number] [place] Night - Client Nonclinical Telephone Record Central Night - Client Client Site [place] Physician Renford Dills - MD Contact Type Call Who Is Calling Patient / Member / Family / Caregiver Caller Name [name] Caller Phone Number [phone] Call Type Message Only Information Provided Reason for Call Returning a Call from the Office Initial Craig [place] states he missed a call from the office. Additional Comment Call Closed By: Idelle Leech Transaction Date/Time: 09/23/2017 7:32:36 PM (ET)

## 2017-09-24 NOTE — Telephone Encounter (Signed)
I spoke with pt and someone called back as a reminder of his appt on 09/28/17. Nothing further needed.

## 2017-09-28 ENCOUNTER — Encounter: Payer: Self-pay | Admitting: Family Medicine

## 2017-09-28 ENCOUNTER — Ambulatory Visit (INDEPENDENT_AMBULATORY_CARE_PROVIDER_SITE_OTHER): Payer: Medicare Other | Admitting: Family Medicine

## 2017-09-28 VITALS — BP 156/78 | HR 71 | Temp 97.8°F | Ht 68.5 in | Wt 231.8 lb

## 2017-09-28 DIAGNOSIS — E1159 Type 2 diabetes mellitus with other circulatory complications: Secondary | ICD-10-CM | POA: Diagnosis not present

## 2017-09-28 DIAGNOSIS — E119 Type 2 diabetes mellitus without complications: Secondary | ICD-10-CM

## 2017-09-28 LAB — POCT GLYCOSYLATED HEMOGLOBIN (HGB A1C): Hemoglobin A1C: 9.5 % — AB (ref 4.0–5.6)

## 2017-09-28 MED ORDER — INSULIN GLARGINE 300 UNIT/ML ~~LOC~~ SOPN: 16.0000 [IU] | PEN_INJECTOR | Freq: Every day | SUBCUTANEOUS | 2 refills | Status: DC

## 2017-09-28 NOTE — Patient Instructions (Signed)
Check your sugar in the mornings.  If above 150, then add 1 unit to the nighttime long acting insulin- toujeo.   Keep adding 1 unit until your sugar is between 100 and 150.   Don't change the plan with your other meds or mealtime insulin.  Recheck in about 3 months at a yearly physical.  Take care.  Glad to see you.

## 2017-09-28 NOTE — Progress Notes (Signed)
Diabetes:  Using medications without difficulties: yes, using basal and mealtime insulin.  Usually using mealtime insulin BID, diet dependent.   Hypoglycemic episodes: no Hyperglycemic episodes: occ elevations noted.   Feet problems: no Blood Sugars averaging: ~200 in the AMs eye exam within last year: yes A1c 9.5, improved from 10.5.     Meds, vitals, and allergies reviewed.   ROS: Per HPI unless specifically indicated in ROS section   GEN: nad, alert and oriented HEENT: mucous membranes moist NECK: supple w/o LA CV: rrr. PULM: ctab, no inc wob ABD: soft, +bs EXT: no edema SKIN: no acute rash but likely lipomas on the L and R forearm and the L side of the neck  Diabetic foot exam: Normal inspection No skin breakdown No calluses  Normal DP pulses Normal sensation to light touch and monofilament Nails normal

## 2017-09-30 NOTE — Assessment & Plan Note (Signed)
Blood Sugars averaging: ~200 in the AMs A1c 9.5, improved from 10.5.    Check sugar in the mornings.  If above 150, then add 1 unit to the nighttime long acting insulin- toujeo.   Keep adding 1 unit until AM sugar is between 100 and 150.   Don't change the plan with your other meds or mealtime insulin.  Recheck in about 3 months at a yearly physical.  He agrees.

## 2017-11-12 ENCOUNTER — Other Ambulatory Visit: Payer: Self-pay | Admitting: Family Medicine

## 2017-12-25 ENCOUNTER — Ambulatory Visit (INDEPENDENT_AMBULATORY_CARE_PROVIDER_SITE_OTHER): Payer: Medicare Other

## 2017-12-25 ENCOUNTER — Other Ambulatory Visit: Payer: Self-pay | Admitting: Family Medicine

## 2017-12-25 VITALS — BP 160/82 | HR 82 | Temp 98.1°F | Ht 69.0 in | Wt 234.2 lb

## 2017-12-25 DIAGNOSIS — E1159 Type 2 diabetes mellitus with other circulatory complications: Secondary | ICD-10-CM

## 2017-12-25 DIAGNOSIS — E538 Deficiency of other specified B group vitamins: Secondary | ICD-10-CM

## 2017-12-25 DIAGNOSIS — Z0000 Encounter for general adult medical examination without abnormal findings: Secondary | ICD-10-CM

## 2017-12-25 DIAGNOSIS — M1711 Unilateral primary osteoarthritis, right knee: Secondary | ICD-10-CM | POA: Diagnosis not present

## 2017-12-25 DIAGNOSIS — Z23 Encounter for immunization: Secondary | ICD-10-CM

## 2017-12-25 DIAGNOSIS — M1712 Unilateral primary osteoarthritis, left knee: Secondary | ICD-10-CM | POA: Diagnosis not present

## 2017-12-25 LAB — LIPID PANEL
CHOLESTEROL: 150 mg/dL (ref 0–200)
HDL: 35.2 mg/dL — ABNORMAL LOW (ref >39.00)
NonHDL: 114.62
Total CHOL/HDL Ratio: 4
Triglycerides: 217 mg/dL — ABNORMAL HIGH (ref 0.0–149.0)
VLDL: 43.4 mg/dL — AB (ref 0.0–40.0)

## 2017-12-25 LAB — VITAMIN B12: VITAMIN B 12: 264 pg/mL (ref 211–911)

## 2017-12-25 LAB — COMPREHENSIVE METABOLIC PANEL
ALBUMIN: 4.2 g/dL (ref 3.5–5.2)
ALT: 17 U/L (ref 0–53)
AST: 12 U/L (ref 0–37)
Alkaline Phosphatase: 74 U/L (ref 39–117)
BUN: 14 mg/dL (ref 6–23)
CALCIUM: 9.9 mg/dL (ref 8.4–10.5)
CHLORIDE: 99 meq/L (ref 96–112)
CO2: 31 meq/L (ref 19–32)
Creatinine, Ser: 1.19 mg/dL (ref 0.40–1.50)
GFR: 62.36 mL/min (ref >60.00)
Glucose, Bld: 256 mg/dL — ABNORMAL HIGH (ref 70–99)
POTASSIUM: 3.8 meq/L (ref 3.5–5.1)
Sodium: 139 mEq/L (ref 135–145)
Total Bilirubin: 0.5 mg/dL (ref 0.2–1.2)
Total Protein: 7.1 g/dL (ref 6.0–8.3)

## 2017-12-25 LAB — TSH: TSH: 2.9 u[IU]/mL (ref 0.35–4.50)

## 2017-12-25 LAB — HEMOGLOBIN A1C: HEMOGLOBIN A1C: 10 % — AB (ref 4.6–6.5)

## 2017-12-25 LAB — LDL CHOLESTEROL, DIRECT: Direct LDL: 101 mg/dL

## 2017-12-25 NOTE — Progress Notes (Signed)
Subjective:   [name] is a 83 y.o. male who presents for Medicare Annual/Subsequent preventive examination.  Review of Systems:  N/A Cardiac Risk Factors include: advanced age (>58men, >57 women);obesity (BMI >30kg/m2);male gender;hypertension;diabetes mellitus     Objective:    Vitals: BP (!) 160/82 (BP Location: Left Arm, Patient Position: Sitting, Cuff Size: Large)   Pulse 82   Temp 98.1 F (36.7 C) (Oral)   Ht 5\' 9"  (1.753 m) Comment: shoes  Wt 234 lb 4 oz (106.3 kg)   SpO2 94%   BMI 34.59 kg/m   Body mass index is 34.59 kg/m.  Advanced Directives 12/25/2017 03/02/2017 03/01/2017 12/17/2016 03/20/2016 12/17/2015 10/05/2014  Does Patient Have a Medical Advance Directive? Yes No No Yes No Yes No  Type of Paramedic of [place];Living will - - [place];Living will - [place];Living will -  Does patient want to make changes to medical advance directive? - - - - - No - Patient declined -  Copy of [place] in Chart? No - copy requested - - No - copy requested - No - copy requested -  Would patient like information on creating a medical advance directive? - No - Patient declined No - Patient declined - No - Patient declined - No - patient declined information  Pre-existing out of facility DNR order (yellow form or pink MOST form) - - - - - - -    Tobacco Social History   Tobacco Use  Smoking Status Never Smoker  Smokeless Tobacco Never Used     Counseling given: No   Clinical Intake:  Pre-visit preparation completed: Yes  Pain : 0-10 Pain Score: 8  Pain Type: Chronic pain Pain Location: Generalized     Nutritional Status: BMI > 30  Obese Nutritional Risks: None Diabetes: No CBG done?: No Did pt. bring in CBG monitor from home?: No  How often do you need to have someone help you when you read instructions, pamphlets, or other written materials from your doctor or pharmacy?: 1  - Never What is the last grade level you completed in school?: 10th grade  Interpreter Needed?: No  Comments: pt is a widower and lives with son Information entered by :: LPinson, LPN  Past Medical History:  Diagnosis Date  . Anxiety   . Borderline hyperlipidemia   . CAD (coronary artery disease)   . Colon polyps   . Diverticulosis of colon   . DJD (degenerative joint disease)   . DM (diabetes mellitus) ([place])    Adult onset  . History of gastritis   . History of kidney stones   . History of pyelonephritis   . History of syncope   . HTN (hypertension)   . Hypertrophy of prostate with urinary obstruction and other lower urinary tract symptoms (LUTS)   . Myocardial infarction ([place])    MORE THAN 10 YRS AGO - TX'D MEDICALLY - NO STENTS     DR. WALL CARDIOLOGIST  . Myoclonus    Past Surgical History:  Procedure Laterality Date  . APPENDECTOMY    . CATARACT EXTRACTION     RIGHT EYE  . CHOLECYSTECTOMY    . cystoscopy  04/17/11   stent placed  . CYSTOSCOPY W/ URETERAL STENT PLACEMENT  04/17/2011   Procedure: CYSTOSCOPY WITH RETROGRADE PYELOGRAM/URETERAL STENT PLACEMENT;  Surgeon: Hanley Ben, MD;  Location: [facility];  Service: Urology;  Laterality: Left;  . CYSTOSCOPY WITH RETROGRADE PYELOGRAM, URETEROSCOPY AND STENT  PLACEMENT Left 04/18/2013   Procedure: CYSTOSCOPY WITH LEFT RETROGRADE PYELOGRAM, LEFT URETEROSCOPY AND LASER LITHOTRIPSY LEFT STENT PLACEMENT;  Surgeon: Dutch Gray, MD;  Location: [facility];  Service: Urology;  Laterality: Left;  . HOLMIUM LASER APPLICATION Left 02/16/107   Procedure: HOLMIUM LASER APPLICATION;  Surgeon: Dutch Gray, MD;  Location: [facility];  Service: Urology;  Laterality: Left;  . IRRIGATION AND DEBRIDEMENT ABSCESS Left 10/05/2014   Procedure: IAND D LEFT INDEX FINGER;  Surgeon: Dayna Barker, MD;  Location: [facility];  Service: Plastics;  Laterality: Left;  . KNEE SURGERY     bilateral ARTHROSCOPY X2 TO EACH KNEE  . MELANOMA EXCISION  2018  . S/P cysto & stents  for kidnedy stone 11/08 by Dr. Reece Agar    . 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 SHOULDER SURGERY  04/2005   left by Dr. Berenice Primas   Family History  Problem Relation Age of Onset  . Heart attack Mother   . Dementia Brother   . Colon cancer Neg Hx   . Prostate cancer Neg Hx    Social History   Socioeconomic History  . Marital status: Widowed    Spouse name: Not on file  . Number of children: 2  . Years of education: Not on file  . Highest education level: Not on file  Occupational History  . Occupation: Retired  Scientific laboratory technician  . Financial resource strain: Not on file  . Food insecurity:    Worry: Not on file    Inability: Not on file  . Transportation needs:    Medical: Not on file    Non-medical: Not on file  Tobacco Use  . Smoking status: Never Smoker  . Smokeless tobacco: Never Used  Substance and Sexual Activity  . Alcohol use: No    Alcohol/week: 0.0 standard drinks  . Drug use: No  . Sexual activity: Never  Lifestyle  . Physical activity:    Days per week: Not on file    Minutes per session: Not on file  . Stress: Not on file  Relationships  . Social connections:    Talks on phone: Not on file    Gets together: Not on file    Attends religious service: Not on file    Active member of club or organization: Not on file    Attends meetings of clubs or organizations: Not on file    Relationship status: Not on file  Other Topics Concern  . Not on file  Social History Narrative   Widowed 2006   2 kids   Enjoys hunting and fishing   Retired from [employer] and [employer] work    Outpatient Encounter Medications as of 12/25/2017  Medication Sig  . amLODipine (NORVASC) 10 MG tablet TAKE 1 TABLET BY MOUTH  EVERY MORNING  . aspirin EC 81 MG tablet Take 81 mg by mouth every evening.   . carvedilol (COREG) 12.5 MG tablet TAKE 1 TABLET BY MOUTH 2  TIMES DAILY WITH MEALS  . COD LIVER OIL PO Take 1 capsule  by mouth 2 (two) times daily.   . cyanocobalamin (,VITAMIN B-12,) 1000 MCG/ML injection Inject 1 mL (1,000 mcg total) every 8 (eight) weeks into the muscle. Dispense 6 of the 1 mL vials.  . diclofenac sodium (VOLTAREN) 1 % GEL Apply 2 g 4 (four) times daily topically. On the knuckles as needed.  Marland Kitchen glimepiride (AMARYL) 4 MG tablet TAKE ONE-HALF TABLET BY  MOUTH BEFORE BREAKFAST  . glucose blood (ONE TOUCH ULTRA TEST) test strip Use as instructed  . HUMALOG KWIKPEN 100 UNIT/ML KiwkPen INJECT INTO SKIN 3 TIMES DAILY BEFORE MEALS USING SLIDING SCALE. 151 TO 200 = 4 UNITS, 201 TO 250= 6 UNITS, 251 TO 300= 8 UNITS, GREATER THA  . hydrALAZINE (APRESOLINE) 50 MG tablet Take 1 tablet (50 mg total) 3 (three) times daily by mouth.  . hydrALAZINE (APRESOLINE) 50 MG tablet TAKE 1 TABLET BY MOUTH 3  TIMES DAILY  . INS SYRINGE/NEEDLE 1CC/28G (B-D INSULIN SYRINGE 1CC/28G) 28G X 1/2" 1 ML MISC Use monthly with B12 shot.  . Insulin Glargine ([name] SOLOSTAR) 300 UNIT/ML SOPN Inject 16 Units into the skin at bedtime.  Marland Kitchen lisinopril-hydrochlorothiazide (PRINZIDE,ZESTORETIC) 20-25 MG tablet TAKE 1 TABLET BY MOUTH  EVERY MORNING  . meclizine (ANTIVERT) 25 MG tablet Take 0.5-1 tablets (12.5-25 mg total) by mouth 2 (two) times daily as needed for dizziness.  Marland Kitchen NOVOFINE 30G X 8 MM MISC FOR USE WITH INSULIN PENS.  . ONE TOUCH ULTRA TEST test strip CHECK UP TO 3 TIMES DAILY  AS NEEDED  . potassium chloride SA (K-DUR,KLOR-CON) 20 MEQ tablet TAKE 1 TABLET BY MOUTH  DAILY  . tamsulosin (FLOMAX) 0.4 MG CAPS capsule TAKE 1 CAPSULE BY MOUTH  DAILY AFTER SUPPER  . traMADol (ULTRAM) 50 MG tablet Take 1 tablet (50 mg total) every 12 (twelve) hours as needed by mouth (for knee pain).   No facility-administered encounter medications on file as of 12/25/2017.     Activities of Daily Living In your present state of health, do you have any difficulty performing the following activities: 12/25/2017 03/02/2017  Hearing? Tempie Donning  Vision? N N   Difficulty concentrating or making decisions? N N  Walking or climbing stairs? N Y  Dressing or bathing? N N  Doing errands, shopping? N N  Preparing Food and eating ? N -  Using the Toilet? N -  In the past six months, have you accidently leaked urine? N -  Do you have problems with loss of bowel control? N -  Managing your Medications? N -  Managing your Finances? N -  Housekeeping or managing your Housekeeping? N -  Some recent data might be hidden    Patient Care Team: Tonia Ghent, MD as PCP - General (Family Medicine) Clent Jacks, MD as Consulting Physician (Ophthalmology) Druscilla Brownie, MD as Consulting Physician (Dermatology) Frederik Pear, MD as Consulting Physician (Orthopedic Surgery) Raynelle Bring, MD as Consulting Physician (Urology) Moss Mc, DDS as Referring Physician (Dentistry)   Assessment:   This is a routine wellness examination for Diaz.  Hearing Screening Comments: Bilateral hearing aids Vision Screening Comments: Vision exam in Jan 2019 with Dr. Katy Fitch  Exercise Activities and Dietary recommendations Current Exercise Habits: Home exercise routine, Type of exercise: calisthenics(20-25 pushups daily), Time (Minutes): 10, Frequency (Times/Week): 7, Weekly Exercise (Minutes/Week): 70, Intensity: Mild, Exercise limited by: orthopedic condition(s)  Goals    . Increase water intake     Starting 12/25/2017, I will continue to drink 6-8 glasses of water daily.        Fall Risk Fall Risk  12/25/2017 12/17/2016 12/17/2015 06/18/2012  Falls in the past year? 0 No No No   Depression Screen PHQ 2/9 Scores 12/25/2017 12/17/2016 12/17/2015 06/18/2012  PHQ - 2 Score 0 0 0 1  PHQ- 9 Score 0 0 - -    Cognitive Function MMSE - Mini Mental State Exam 12/25/2017 12/17/2016 12/17/2015 05/04/2014  Orientation to time 5 5 5 4   Orientation to Place 5 5 5 5   Registration 3 3 3 3   Attention/ Calculation 0 0 0 0  Recall 3 3 2 3   Recall-comments - - pt was unable  to recall 1 of 3 words -  Language- name 2 objects 0 0 0 2  Language- repeat 1 1 1 1   Language- follow 3 step command 3 2 3 3   Language- follow 3 step command-comments - unable to follow 1 step of 3 step command - -  Language- read & follow direction 0 0 0 1  Write a sentence 0 0 0 1  Copy design 0 0 0 0  Total score 20 19 19 23        PLEASE NOTE: A Mini-Cog screen was completed. Maximum score is 20. A value of 0 denotes this part of [place] MMSE was not completed or the patient failed this part of the Mini-Cog screening.   Mini-Cog Screening Orientation to Time - Max 5 pts Orientation to Place - Max 5 pts Registration - Max 3 pts Recall - Max 3 pts Language Repeat - Max 1 pts Language Follow 3 Step Command - Max 3 pts   Immunization History  Administered Date(s) Administered  . Influenza Split 11/30/2010, 11/24/2011  . Influenza Whole 03/27/2009  . Influenza, High Dose Seasonal PF 12/05/2014, 12/17/2016, 12/25/2017  . Influenza,inj,Quad PF,6+ Mos 12/29/2012  . Influenza-Unspecified 11/10/2013  . Pneumococcal Conjugate-13 11/10/2013  . Pneumococcal Polysaccharide-23 09/06/2004  . Td 02/12/2009  . Tdap 10/09/2011, 08/15/2014  . Zoster 07/28/2013    Screening Tests Health Maintenance  Topic Date Due  . OPHTHALMOLOGY EXAM  04/22/2018  . HEMOGLOBIN A1C  06/25/2018  . FOOT EXAM  09/29/2018  . TETANUS/TDAP  08/14/2024  . INFLUENZA VACCINE  Completed  . PNA vac Low Risk Adult  Completed       Plan:     I have personally reviewed, addressed, and noted the following in the patient's chart:  A. Medical and social history B. Use of alcohol, tobacco or illicit drugs  C. Current medications and supplements D. Functional ability and status E.  Nutritional status F.  Physical activity G. Advance directives H. List of other physicians I.  Hospitalizations, surgeries, and ER visits in previous 12 months J.  [place] to include hearing, vision, cognitive,  depression L. Referrals and appointments - none  In addition, I have reviewed and discussed with patient certain preventive protocols, quality metrics, and best practice recommendations. A written personalized care plan for preventive services as well as general preventive health recommendations were provided to patient.  See attached scanned questionnaire for additional information.   Signed,   Lindell Noe, MHA, BS, LPN Health Coach

## 2017-12-25 NOTE — Patient Instructions (Signed)
[name] , Thank you for taking time to come for your Medicare Wellness Visit. I appreciate your ongoing commitment to your health goals. Please review the following plan we discussed and let me know if I can assist you in the future.   These are the goals we discussed: Goals    . Increase water intake     Starting 12/25/2017, I will continue to drink 6-8 glasses of water daily.        This is a list of the screening recommended for you and due dates:  Health Maintenance  Topic Date Due  . Eye exam for diabetics  04/22/2018  . Hemoglobin A1C  06/25/2018  . Complete foot exam   09/29/2018  . Tetanus Vaccine  08/14/2024  . Flu Shot  Completed  . Pneumonia vaccines  Completed   Preventive Care for Adults  A healthy lifestyle and preventive care can promote health and wellness. Preventive health guidelines for adults include the following key practices.  . A routine yearly physical is a good way to check with your health care provider about your health and preventive screening. It is a chance to share any concerns and updates on your health and to receive a thorough exam.  . Visit your dentist for a routine exam and preventive care every 6 months. Brush your teeth twice a day and floss once a day. Good oral hygiene prevents tooth decay and gum disease.  . The frequency of eye exams is based on your age, health, family medical history, use  of contact lenses, and other factors. Follow your health care provider's recommendations for frequency of eye exams.  . Eat a healthy diet. Foods like vegetables, fruits, whole grains, low-fat dairy products, and lean protein foods contain the nutrients you need without too many calories. Decrease your intake of foods high in solid fats, added sugars, and salt. Eat the right amount of calories for you. Get information about a proper diet from your health care provider, if necessary.  . Regular physical exercise is one of the most important things  you can do for your health. Most adults should get at least 150 minutes of moderate-intensity exercise (any activity that increases your heart rate and causes you to sweat) each week. In addition, most adults need muscle-strengthening exercises on 2 or more days a week.  Silver Sneakers may be a benefit available to you. To determine eligibility, you may visit the website: www.silversneakers.com or contact program at 289-534-3346 Mon-Fri between 8AM-8PM.   . Maintain a healthy weight. The body mass index (BMI) is a screening tool to identify possible weight problems. It provides an estimate of body fat based on height and weight. Your health care provider can find your BMI and can help you achieve or maintain a healthy weight.   For adults 20 years and older: ? A BMI below 18.5 is considered underweight. ? A BMI of 18.5 to 24.9 is normal. ? A BMI of 25 to 29.9 is considered overweight. ? A BMI of 30 and above is considered obese.   . Maintain normal blood lipids and cholesterol levels by exercising and minimizing your intake of saturated fat. Eat a balanced diet with plenty of fruit and vegetables. Blood tests for lipids and cholesterol should begin at age 92 and be repeated every 5 years. If your lipid or cholesterol levels are high, you are over 50, or you are at high risk for heart disease, you may need your cholesterol levels checked more  frequently. Ongoing high lipid and cholesterol levels should be treated with medicines if diet and exercise are not working.  . If you smoke, find out from your health care provider how to quit. If you do not use tobacco, please do not start.  . If you choose to drink alcohol, please do not consume more than 2 drinks per day. One drink is considered to be 12 ounces (355 mL) of beer, 5 ounces (148 mL) of wine, or 1.5 ounces (44 mL) of liquor.  . If you are 44-55 years old, ask your health care provider if you should take aspirin to prevent strokes.  . Use  sunscreen. Apply sunscreen liberally and repeatedly throughout the day. You should seek shade when your shadow is shorter than you. Protect yourself by wearing long sleeves, pants, a wide-brimmed hat, and sunglasses year round, whenever you are outdoors.  . Once a month, do a whole body skin exam, using a mirror to look at the skin on your back. Tell your health care provider of new moles, moles that have irregular borders, moles that are larger than a pencil eraser, or moles that have changed in shape or color.

## 2017-12-25 NOTE — Progress Notes (Signed)
PCP notes:   Health maintenance:  Flu vaccine - administered  Abnormal screenings:   None  Patient concerns:   None  Nurse concerns:  None  Next PCP appt:   12/29/17 @ 0017  I reviewed health advisor's note, was available for consultation on the day of service listed in this note, and agree with documentation and plan. [name], MD.

## 2017-12-28 ENCOUNTER — Other Ambulatory Visit: Payer: Self-pay | Admitting: Family Medicine

## 2017-12-29 ENCOUNTER — Encounter: Payer: Self-pay | Admitting: Family Medicine

## 2017-12-29 ENCOUNTER — Ambulatory Visit (INDEPENDENT_AMBULATORY_CARE_PROVIDER_SITE_OTHER): Payer: Medicare Other | Admitting: Family Medicine

## 2017-12-29 VITALS — BP 146/62 | HR 82 | Temp 98.1°F | Ht 69.0 in | Wt 234.2 lb

## 2017-12-29 DIAGNOSIS — R2681 Unsteadiness on feet: Secondary | ICD-10-CM

## 2017-12-29 DIAGNOSIS — E785 Hyperlipidemia, unspecified: Secondary | ICD-10-CM | POA: Diagnosis not present

## 2017-12-29 DIAGNOSIS — E1159 Type 2 diabetes mellitus with other circulatory complications: Secondary | ICD-10-CM

## 2017-12-29 DIAGNOSIS — I10 Essential (primary) hypertension: Secondary | ICD-10-CM | POA: Diagnosis not present

## 2017-12-29 DIAGNOSIS — Z0000 Encounter for general adult medical examination without abnormal findings: Secondary | ICD-10-CM

## 2017-12-29 DIAGNOSIS — E538 Deficiency of other specified B group vitamins: Secondary | ICD-10-CM | POA: Diagnosis not present

## 2017-12-29 DIAGNOSIS — Z7189 Other specified counseling: Secondary | ICD-10-CM

## 2017-12-29 MED ORDER — INSULIN LISPRO (1 UNIT DIAL) 100 UNIT/ML (KWIKPEN): PEN_INJECTOR | SUBCUTANEOUS | 5 refills | Status: DC

## 2017-12-29 MED ORDER — CYANOCOBALAMIN 1000 MCG/ML IJ SOLN: 1000.0000 ug | INTRAMUSCULAR | 0 refills | Status: DC

## 2017-12-29 MED ORDER — INSULIN GLARGINE (1 UNIT DIAL) 300 UNIT/ML ~~LOC~~ SOPN: 16.0000 [IU] | PEN_INJECTOR | Freq: Every day | SUBCUTANEOUS | Status: DC

## 2017-12-29 NOTE — Patient Instructions (Addendum)
Call dermatology about a follow up appointment- Dr. Ledell [facility].    Change the B12 back to monthly.    Try adding 1 unit to your insulin at night until your morning sugar is lower, down to around 120.  Keep working on [employer], avoiding sweets.   Recheck labs at a visit in about 3 months.    Let the pharmacy know when you need refills.   Take care.  Glad to see you.

## 2017-12-29 NOTE — Progress Notes (Signed)
Diabetes:  Using medications without difficulties: yes Hypoglycemic episodes:no Hyperglycemic episodes: presumed, given A1c Feet problems: no Blood Sugars averaging: 120-165 in the AM.  Not checked after meals.  eye exam within last year: yes A1c up, d/w pt.  With recent injection per ortho that could have affected his sugar recently.   Discussed endo referral, encouraged, declined by patient.    Hypertension:    Using medication without problems or lightheadedness: yes Chest pain with exertion:no Edema:no Short of breath:no Labs d/w pt.    Lipids d/w pt.  D/w pt about statin use given DM2.  Getting his A1c controlled or at least improved as likely a more significant issue and we can work on that first.  See above.  B12 back down, nearly low. On replacement every 8 weeks.  He gives himself his injection at home.   Prev movement disorder is apparently resolved with clear cause.    Encouraged dermatology given h/o melanoma.    Flu vaccine- 2019 Shingles d/w pt.  Shingrix not yet available.   PNA and Tetanus up to date.  PSA and colon CA screening declined due to age.   Living will d/w pt. Would have his sister Darliss Ridgel and his daughter and his son all equally designated if he were incapacitated.  Lives at home, alone.  Still independent.  No falls.  D/w pt about routine safety.    PMH and SH reviewed  Meds, vitals, and allergies reviewed.   ROS: Per HPI unless specifically indicated in ROS section   GEN: nad, alert and oriented HEENT: mucous membranes moist NECK: supple w/o LA CV: rrr. PULM: ctab, no inc wob ABD: soft, +bs EXT: no edema SKIN: no acute rash  Diabetic foot exam: Normal inspection except for dry skin No skin breakdown No calluses  Normal DP pulses Normal sensation to light touch and monofilament Nails thickened

## 2018-01-01 NOTE — Assessment & Plan Note (Signed)
Flu vaccine- 2019 Shingles d/w pt.  Shingrix not yet available.   PNA and Tetanus up to date.  PSA and colon CA screening declined due to age.   Living will d/w pt. Would have his sister Darliss Ridgel and his daughter and his son all equally designated if he were incapacitated.  Lives at home, alone.  Still independent.  No falls.  D/w pt about routine safety.

## 2018-01-01 NOTE — Assessment & Plan Note (Signed)
Living will d/w pt. Would have his sister [name] and his daughter and his son all equally designated if he were incapacitated.

## 2018-01-01 NOTE — Assessment & Plan Note (Signed)
D/w pt about statin use given DM2.  Getting his A1c controlled or at least improved as likely a more significant issue and we can work on that first.  See above.

## 2018-01-01 NOTE — Assessment & Plan Note (Signed)
B12 back down, nearly low. On replacement every 8 weeks.  He gives himself his injection at home.  He is compliant but the spacing of the injection may be too far apart.  Discussed with patient about restarting monthly injection.  He agrees.

## 2018-01-01 NOTE — Assessment & Plan Note (Signed)
History of.  No falls.  Routine cautions given.  He agrees.

## 2018-01-01 NOTE — Assessment & Plan Note (Signed)
Reasonable control on recheck.  I do not want to induce hypotension.  Continue as is for now.  He agrees.

## 2018-01-01 NOTE — Assessment & Plan Note (Signed)
A1c elevated.  Encouraged endocrine referral.  He declined.  Discussed options.  He may have some relative hypoglycemia given recent joint injection per orthopedics.  In the meantime would try adding 1 unit to insulin dose at night until his morning sugar is lower, down to around 120.  Keep working on [employer], avoiding sweets.  Recheck labs at a visit in about 3 months.    >25 minutes spent in face to face time with patient, >50% spent in counselling or coordination of care

## 2018-01-11 ENCOUNTER — Telehealth: Payer: Self-pay | Admitting: *Deleted

## 2018-01-11 NOTE — Telephone Encounter (Addendum)
-----   Message from Tonia Ghent, MD sent at 01/01/2018  6:46 AM EST ----- Please get update on patient.  I need to know how his sugar is running and about his current insulin dose.  His last A1c was high and he was going to add to his nighttime insulin in the meantime.  Thanks.  01/12/2018  6:09 PM Patient phoned back in but I was not able to take the call at the time.  I later called the patient who said he had given the requested information to another young lady here at our office.  Therefore, I didn't ask him to repeat it.  However, if Dr. Damita Dunnings has not received this information, please let me know and I will try to call the patient again.  Mike Craze, CMA

## 2018-01-13 NOTE — Telephone Encounter (Signed)
I do not see the information otherwise.  Please see what details you can get.  Thanks.

## 2018-01-13 NOTE — Telephone Encounter (Signed)
Left message on voicemail for patient to call back. 

## 2018-01-14 ENCOUNTER — Other Ambulatory Visit: Payer: Self-pay | Admitting: *Deleted

## 2018-01-14 MED ORDER — GLIMEPIRIDE 4 MG PO TABS: ORAL_TABLET | ORAL | 3 refills | Status: DC

## 2018-01-15 ENCOUNTER — Encounter: Payer: Self-pay | Admitting: *Deleted

## 2018-01-18 ENCOUNTER — Telehealth: Payer: Self-pay | Admitting: *Deleted

## 2018-01-18 NOTE — Telephone Encounter (Signed)
Patient says he is doing well, BS running 135-200.  Night time insulin is at 16 units.  He is adjusting his mealtime insulin according to BS readings.

## 2018-01-18 NOTE — Telephone Encounter (Signed)
Letter mailed

## 2018-01-19 ENCOUNTER — Other Ambulatory Visit: Payer: Self-pay | Admitting: *Deleted

## 2018-01-19 MED ORDER — GLIMEPIRIDE 4 MG PO TABS: ORAL_TABLET | ORAL | 3 refills | Status: DC

## 2018-01-20 NOTE — Telephone Encounter (Addendum)
Noted.  Glad his sugar is improved.  If he has significant sugar elevation or other troubles in the meantime then please let me know.  I want to recheck A1c at office visit in about 3 months.  Please make sure that is scheduled.  If this sugar trend continues as is then he should have significant improvement in his A1c on recheck.  Thanks.

## 2018-01-20 NOTE — Telephone Encounter (Signed)
Patient notified as instructed by telephone and verbalized understanding. Follow-up appointment as instructed.

## 2018-03-03 ENCOUNTER — Other Ambulatory Visit: Payer: Self-pay | Admitting: Orthopedic Surgery

## 2018-03-11 NOTE — Patient Instructions (Addendum)
MARCIO HOQUE  03/11/2018   Your procedure is scheduled on: 03-22-18  Report to [facility] Main  Entrance              Report to admitting at             0530  AM    Call this number if you have problems the morning of surgery [phone]    Remember: Do not eat food or drink liquids :After Midnight. BRUSH YOUR TEETH MORNING OF SURGERY AND RINSE YOUR MOUTH OUT, NO CHEWING GUM CANDY OR MINTS.     Take these medicines the morning of surgery with A SIP OF WATER: hydralazine, carvedilol, amlodipine  DO NOT TAKE ANY DIABETIC MEDICATIONS DAY OF YOUR SURGERY    Unless BS greater than 220 see below                 How to Manage Your Diabetes Before and After Surgery  Why is it important to control my blood sugar before and after surgery? . Improving blood sugar levels before and after surgery helps healing and can limit problems. . A way of improving blood sugar control is eating a healthy diet by: o  Eating less sugar and carbohydrates o  Increasing activity/exercise o  Talking with your doctor about reaching your blood sugar goals . High blood sugars (greater than 180 mg/dL) can raise your risk of infections and slow your recovery, so you will need to focus on controlling your diabetes during the weeks before surgery. . Make sure that the doctor who takes care of your diabetes knows about your planned surgery including the date and location.  How do I manage my blood sugar before surgery? . Check your blood sugar at least 4 times a day, starting 2 days before surgery, to make sure that the level is not too high or low. o Check your blood sugar the morning of your surgery when you wake up and every 2 hours until you get to the Short Stay unit. . If your blood sugar is less than 70 mg/dL, you will need to treat for low blood sugar: o Do not take insulin. o Treat a low blood sugar (less than 70 mg/dL) with  cup of clear juice (cranberry or apple), 4 glucose  tablets, OR glucose gel. o Recheck blood sugar in 15 minutes after treatment (to make sure it is greater than 70 mg/dL). If your blood sugar is not greater than 70 mg/dL on recheck, call [phone] for further instructions. . Report your blood sugar to the short stay nurse when you get to Short Stay.  . If you are admitted to the hospital after surgery: o Your blood sugar will be checked by the staff and you will probably be given insulin after surgery (instead of oral diabetes medicines) to make sure you have good blood sugar levels. o The goal for blood sugar control after surgery is 80-180 mg/dL.   WHAT DO I DO ABOUT MY DIABETES MEDICATION?  Marland Kitchen Do not take oral diabetes medicines (pills) the morning of surgery.  . THE NIGHT BEFORE SURGERY, take  50% of your    Normal  insulin Glargine (toujeo)   Insulin dose .          And no bedtime dose of you sliding scale insulin   Take sliding scale insulin as usual with meals the day before surgery   .  THE MORNING OF SURGERY, take  0  units of  toujeo          insulin.  . The day of surgery, do not take other diabetes injectables, including Byetta (exenatide), Bydureon (exenatide ER), Victoza (liraglutide), or Trulicity (dulaglutide).  . If your CBG is greater than 220 mg/dL, you may take  of your sliding scale  . (correction) dose of insulin.    Patient Signature:  Date:   Nurse Signature:  Date:                You may not have any metal on your body including hair pins and              piercings  Do not wear jewelry, lotions, powders or perfumes, deodorant                     Men may shave face and neck.   Do not bring valuables to the hospital. [place].  Contacts, dentures or bridgework may not be worn into surgery.  Leave suitcase in the car. After surgery it may be brought to your room.               Please read over the following fact sheets you were  given: _____________________________________________________________________             [facility] - Preparing for Surgery Before surgery, you can play an important role.  Because skin is not sterile, your skin needs to be as free of germs as possible.  You can reduce the number of germs on your skin by washing with CHG (chlorahexidine gluconate) soap before surgery.  CHG is an antiseptic cleaner which kills germs and bonds with the skin to continue killing germs even after washing. Please DO NOT use if you have an allergy to CHG or antibacterial soaps.  If your skin becomes reddened/irritated stop using the CHG and inform your nurse when you arrive at Short Stay. Do not shave (including legs and underarms) for at least 48 hours prior to the first CHG shower.  You may shave your face/neck. Please follow these instructions carefully:  1.  Shower with CHG Soap the night before surgery and the  morning of Surgery.  2.  If you choose to wash your hair, wash your hair first as usual with your  normal  shampoo.  3.  After you shampoo, rinse your hair and body thoroughly to remove the  shampoo.                           4.  Use CHG as you would any other liquid soap.  You can apply chg directly  to the skin and wash                       Gently with a scrungie or clean washcloth.  5.  Apply the CHG Soap to your body ONLY FROM THE NECK DOWN.   Do not use on face/ open                           Wound or open sores. Avoid contact with eyes, ears mouth and genitals (private parts).  Wash face,  Genitals (private parts) with your normal soap.             6.  Wash thoroughly, paying special attention to the area where your surgery  will be performed.  7.  Thoroughly rinse your body with warm water from the neck down.  8.  DO NOT shower/wash with your normal soap after using and rinsing off  the CHG Soap.                9.  Pat yourself dry with a clean towel.            10.  Wear  clean pajamas.            11.  Place clean sheets on your bed the night of your first shower and do not  sleep with pets. Day of Surgery : Do not apply any lotions/deodorants the morning of surgery.  Please wear clean clothes to the hospital/surgery center.  FAILURE TO FOLLOW THESE INSTRUCTIONS MAY RESULT IN THE CANCELLATION OF YOUR SURGERY PATIENT SIGNATURE_________________________________  NURSE SIGNATURE__________________________________  ________________________________________________________________________  WHAT IS A BLOOD TRANSFUSION? Blood Transfusion Information  A transfusion is the replacement of blood or some of its parts. Blood is made up of multiple cells which provide different functions.  Red blood cells carry oxygen and are used for blood loss replacement.  White blood cells fight against infection.  Platelets control bleeding.  Plasma helps clot blood.  Other blood products are available for specialized needs, such as hemophilia or other clotting disorders. BEFORE THE TRANSFUSION  Who gives blood for transfusions?   Healthy volunteers who are fully evaluated to make sure their blood is safe. This is blood bank blood. Transfusion therapy is the safest it has ever been in the practice of medicine. Before blood is taken from a donor, a complete history is taken to make sure that person has no history of diseases nor engages in risky social behavior (examples are intravenous drug use or sexual activity with multiple partners). The donor's travel history is screened to minimize risk of transmitting infections, such as malaria. The donated blood is tested for signs of infectious diseases, such as HIV and hepatitis. The blood is then tested to be sure it is compatible with you in order to minimize the chance of a transfusion reaction. If you or a relative donates blood, this is often done in anticipation of surgery and is not appropriate for emergency situations. It takes  many days to process the donated blood. RISKS AND COMPLICATIONS Although transfusion therapy is very safe and saves many lives, the main dangers of transfusion include:   Getting an infectious disease.  Developing a transfusion reaction. This is an allergic reaction to something in the blood you were given. Every precaution is taken to prevent this. The decision to have a blood transfusion has been considered carefully by your caregiver before blood is given. Blood is not given unless the benefits outweigh the risks. AFTER THE TRANSFUSION  Right after receiving a blood transfusion, you will usually feel much better and more energetic. This is especially true if your red blood cells have gotten low (anemic). The transfusion raises the level of the red blood cells which carry oxygen, and this usually causes an energy increase.  The nurse administering the transfusion will monitor you carefully for complications. HOME CARE INSTRUCTIONS  No special instructions are needed after a transfusion. You may find your energy is better. Speak with your caregiver about any  limitations on activity for underlying diseases you may have. SEEK MEDICAL CARE IF:   Your condition is not improving after your transfusion.  You develop redness or irritation at the intravenous (IV) site. SEEK IMMEDIATE MEDICAL CARE IF:  Any of the following symptoms occur over the next 12 hours:  Shaking chills.  You have a temperature by mouth above 102 F (38.9 C), not controlled by medicine.  Chest, back, or muscle pain.  People around you feel you are not acting correctly or are confused.  Shortness of breath or difficulty breathing.  Dizziness and fainting.  You get a rash or develop hives.  You have a decrease in urine output.  Your urine turns a dark color or changes to pink, red, or brown. Any of the following symptoms occur over the next 10 days:  You have a temperature by mouth above 102 F (38.9 C), not  controlled by medicine.  Shortness of breath.  Weakness after normal activity.  The white part of the eye turns yellow (jaundice).  You have a decrease in the amount of urine or are urinating less often.  Your urine turns a dark color or changes to pink, red, or brown. Document Released: 01/25/2000 Document Revised: 04/21/2011 Document Reviewed: 09/13/2007 ExitCare Patient Information 2014 [place].  _______________________________________________________________________  Incentive Spirometer  An incentive spirometer is a tool that can help keep your lungs clear and active. This tool measures how well you are filling your lungs with each breath. Taking long deep breaths may help reverse or decrease the chance of developing breathing (pulmonary) problems (especially infection) following:  A long period of time when you are unable to move or be active. BEFORE THE PROCEDURE   If the spirometer includes an indicator to show your best effort, your nurse or respiratory therapist will set it to a desired goal.  If possible, sit up straight or lean slightly forward. Try not to slouch.  Hold the incentive spirometer in an upright position. INSTRUCTIONS FOR USE  1. Sit on the edge of your bed if possible, or sit up as far as you can in bed or on a chair. 2. Hold the incentive spirometer in an upright position. 3. Breathe out normally. 4. Place the mouthpiece in your mouth and seal your lips tightly around it. 5. Breathe in slowly and as deeply as possible, raising the piston or the ball toward the top of the column. 6. Hold your breath for 3-5 seconds or for as long as possible. Allow the piston or ball to fall to the bottom of the column. 7. Remove the mouthpiece from your mouth and breathe out normally. 8. Rest for a few seconds and repeat Steps 1 through 7 at least 10 times every 1-2 hours when you are awake. Take your time and take a few normal breaths between deep  breaths. 9. The spirometer may include an indicator to show your best effort. Use the indicator as a goal to work toward during each repetition. 10. After each set of 10 deep breaths, practice coughing to be sure your lungs are clear. If you have an incision (the cut made at the time of surgery), support your incision when coughing by placing a pillow or rolled up towels firmly against it. Once you are able to get out of bed, walk around indoors and cough well. You may stop using the incentive spirometer when instructed by your caregiver.  RISKS AND COMPLICATIONS  Take your time so you do not get dizzy  or light-headed.  If you are in pain, you may need to take or ask for pain medication before doing incentive spirometry. It is harder to take a deep breath if you are having pain. AFTER USE  Rest and breathe slowly and easily.  It can be helpful to keep track of a log of your progress. Your caregiver can provide you with a simple table to help with this. If you are using the spirometer at home, follow these instructions: [place] IF:   You are having difficultly using the spirometer.  You have trouble using the spirometer as often as instructed.  Your pain medication is not giving enough relief while using the spirometer.  You develop fever of 100.5 F (38.1 C) or higher. SEEK IMMEDIATE MEDICAL CARE IF:   You cough up bloody sputum that had not been present before.  You develop fever of 102 F (38.9 C) or greater.  You develop worsening pain at or near the incision site. MAKE SURE YOU:   Understand these instructions.  Will watch your condition.  Will get help right away if you are not doing well or get worse. Document Released: 06/09/2006 Document Revised: 04/21/2011 Document Reviewed: 08/10/2006 [facility] Patient Information 2014 [place], [place].   ________________________________________________________________________

## 2018-03-16 ENCOUNTER — Encounter (HOSPITAL_COMMUNITY)
Admission: RE | Admit: 2018-03-16 | Discharge: 2018-03-16 | Disposition: A | Discharge status: Home / Self Care | Payer: Medicare Other | Source: Ambulatory Visit | Attending: Orthopedic Surgery | Admitting: Orthopedic Surgery

## 2018-03-16 ENCOUNTER — Other Ambulatory Visit: Payer: Self-pay

## 2018-03-16 ENCOUNTER — Ambulatory Visit (HOSPITAL_COMMUNITY)
Admission: RE | Admit: 2018-03-16 | Discharge: 2018-03-16 | Disposition: A | Discharge status: Home / Self Care | Payer: Medicare Other | Source: Ambulatory Visit | Attending: Orthopedic Surgery | Admitting: Orthopedic Surgery

## 2018-03-16 ENCOUNTER — Encounter (HOSPITAL_COMMUNITY): Payer: Self-pay

## 2018-03-16 DIAGNOSIS — Z01818 Encounter for other preprocedural examination: Secondary | ICD-10-CM | POA: Diagnosis not present

## 2018-03-16 HISTORY — DX: Other complications of anesthesia, initial encounter: T88.59XA

## 2018-03-16 HISTORY — DX: Adverse effect of unspecified anesthetic, initial encounter: T41.45XA

## 2018-03-16 LAB — CBC WITH DIFFERENTIAL/PLATELET
Abs Immature Granulocytes: 0.02 10*3/uL (ref 0.00–0.07)
Basophils Absolute: 0.1 10*3/uL (ref 0.0–0.1)
Basophils Relative: 1 %
Eosinophils Absolute: 0.2 10*3/uL (ref 0.0–0.5)
Eosinophils Relative: 3 %
HCT: 46.2 % (ref 39.0–52.0)
Hemoglobin: 14.9 g/dL (ref 13.0–17.0)
Immature Granulocytes: 0 %
Lymphocytes Relative: 27 %
Lymphs Abs: 1.8 10*3/uL (ref 0.7–4.0)
MCH: 30 pg (ref 26.0–34.0)
MCHC: 32.3 g/dL (ref 30.0–36.0)
MCV: 93.1 fL (ref 80.0–100.0)
Monocytes Absolute: 0.7 10*3/uL (ref 0.1–1.0)
Monocytes Relative: 11 %
NEUTROS ABS: 3.9 10*3/uL (ref 1.7–7.7)
NRBC: 0 % (ref 0.0–0.2)
Neutrophils Relative %: 58 %
Platelets: 303 10*3/uL (ref 150–400)
RBC: 4.96 MIL/uL (ref 4.22–5.81)
RDW: 14.3 % (ref 11.5–15.5)
WBC: 6.7 10*3/uL (ref 4.0–10.5)

## 2018-03-16 LAB — URINALYSIS, ROUTINE W REFLEX MICROSCOPIC
Bilirubin Urine: NEGATIVE
Glucose, UA: NEGATIVE mg/dL
Hgb urine dipstick: NEGATIVE
KETONES UR: NEGATIVE mg/dL
Nitrite: NEGATIVE
PROTEIN: NEGATIVE mg/dL
Specific Gravity, Urine: 1.018 (ref 1.005–1.030)
pH: 5 (ref 5.0–8.0)

## 2018-03-16 LAB — BASIC METABOLIC PANEL
Anion gap: 6 (ref 5–15)
BUN: 22 mg/dL (ref 8–23)
CO2: 28 mmol/L (ref 22–32)
Calcium: 9.2 mg/dL (ref 8.9–10.3)
Chloride: 103 mmol/L (ref 98–111)
Creatinine, Ser: 1.17 mg/dL (ref 0.61–1.24)
GFR calc Af Amer: >60 mL/min (ref >60)
GFR calc non Af Amer: 58 mL/min — ABNORMAL LOW (ref >60)
GLUCOSE: 186 mg/dL — AB (ref 70–99)
Potassium: 3.5 mmol/L (ref 3.5–5.1)
Sodium: 137 mmol/L (ref 135–145)

## 2018-03-16 LAB — HEMOGLOBIN A1C
HEMOGLOBIN A1C: 9.1 % — AB (ref 4.8–5.6)
Mean Plasma Glucose: 214.47 mg/dL

## 2018-03-16 LAB — APTT: aPTT: 29 seconds (ref 24–36)

## 2018-03-16 LAB — GLUCOSE, CAPILLARY: Glucose-Capillary: 178 mg/dL — ABNORMAL HIGH (ref 70–99)

## 2018-03-16 LAB — PROTIME-INR
INR: 0.98
Prothrombin Time: 12.9 seconds (ref 11.4–15.2)

## 2018-03-16 LAB — SURGICAL PCR SCREEN
MRSA, PCR: NEGATIVE
Staphylococcus aureus: POSITIVE — AB

## 2018-03-16 LAB — ABO/RH: ABO/RH(D): O POS

## 2018-03-16 NOTE — Progress Notes (Addendum)
PCP:  [name]   CARDIOLOGIST: none  INFO IN Epic:  hgba1c 9.1 UA many bacteria and leukocytes  INFO ON CHART: DM , MI pt. Denies, CAD pt denies HTN   BLOOD THINNERS AND LAST DOSES:81 mg asa ____________________________________  PATIENT SYMPTOMS AT TIME OF PREOP: none

## 2018-03-17 ENCOUNTER — Encounter: Payer: Self-pay | Admitting: Physician Assistant

## 2018-03-17 NOTE — Progress Notes (Signed)
Anesthesia Chart Review   Case:  [id number] Date/Time:  03/22/18 0700   Procedure:  TOTAL KNEE ARTHROPLASTY (Left )   Anesthesia type:  Spinal   Pre-op diagnosis:  LEFT KNEE OSTEOARTHRITIS   Location:  [place] [facility] / [facility]   Surgeon:  Dorna Leitz, MD      DISCUSSION:84 yo never smoker with h/o HLD, HTN, anxiety, DM II, left knee OA scheduled for above surgery on 03/22/18 with Dr. Dorna Leitz.   A1C 9.1 at PST visit on 03/16/2018.  Lab value called into Dr. Berenice Primas.  He was seen by PCP, Dr. [name], 12/25/17 with A1C of 10.0. Endocrine referral recommended, pt declined.  Insulin increased at this visit.   VS: BP 139/60   Pulse 73   Temp 36.8 C   Resp 20   Ht 5\' 9"  (1.753 m)   Wt 104.3 kg   SpO2 96%   BMI 33.97 kg/m   PROVIDERS: Tonia Ghent, MD is PCP    LABS: Labs reviewed: Acceptable for surgery. (all labs ordered are listed, but only abnormal results are displayed)  Labs Reviewed  SURGICAL PCR SCREEN - Abnormal; Notable for the following components:      Result Value   Staphylococcus aureus POSITIVE (*)    All other components within normal limits  BASIC METABOLIC PANEL - Abnormal; Notable for the following components:   Glucose, Bld 186 (*)    GFR calc non Af Amer 58 (*)    All other components within normal limits  URINALYSIS, ROUTINE W REFLEX MICROSCOPIC - Abnormal; Notable for the following components:   Leukocytes, UA SMALL (*)    Bacteria, UA MANY (*)    All other components within normal limits  HEMOGLOBIN A1C - Abnormal; Notable for the following components:   Hgb A1c MFr Bld 9.1 (*)    All other components within normal limits  GLUCOSE, CAPILLARY - Abnormal; Notable for the following components:   Glucose-Capillary 178 (*)    All other components within normal limits  APTT  CBC WITH DIFFERENTIAL/PLATELET  PROTIME-INR  TYPE AND SCREEN  ABO/RH     IMAGES: Chest Xray 03/16/2018 FINDINGS: The heart size and mediastinal contours are within  normal limits. Both lungs are clear. No pneumothorax or pleural effusion is noted. The visualized skeletal structures are unremarkable.  IMPRESSION: No active cardiopulmonary disease.  EKG: 03/16/2018 Rate 71 bpm Normal sinus rhythm with sinus arrhythmia Minimal voltage criteria for LVH, may be normal variant  Nonspecific T wave abnormality   CV: Echo 01/27/14 Study Conclusions  - Left ventricle: The cavity size was mildly dilated. Wall thickness was increased in a pattern of mild LVH. Systolic function was mildly reduced. The estimated ejection fraction was in the range of 45% to 50%. Doppler parameters are consistent with abnormal left ventricular relaxation (grade 1 diastolic dysfunction). - Left atrium: The atrium was mildly dilated. - Right ventricle: The cavity size was mildly dilated. - Right atrium: The atrium was mildly dilated. - Pulmonary arteries: PA peak pressure: 32 mm Hg (S).  02/19/2012 Stress Test  Low risk myoview  Past Medical History:  Diagnosis Date  . Anxiety   . Borderline hyperlipidemia   . CAD (coronary artery disease)    pt. denies at preop  . Colon polyps   . Complication of anesthesia    trouble urinating after anesthesia  . Diverticulosis of colon   . DJD (degenerative joint disease)   . DM (diabetes mellitus) ([place])    Adult  onset  type 2  . History of gastritis   . History of kidney stones   . History of pyelonephritis   . History of syncope   . HTN (hypertension)   . Hypertrophy of prostate with urinary obstruction and other lower urinary tract symptoms (LUTS)   . Melanoma ([place])    back of neck  . Myocardial infarction ([place])    tx'd medically  pt. denies at preop  . Myoclonus     Past Surgical History:  Procedure Laterality Date  . APPENDECTOMY    . cartarized  nose blood vessel     left side  . CATARACT EXTRACTION     RIGHT EYE  . CHOLECYSTECTOMY    . cystoscopy  04/17/11   stent placed  . CYSTOSCOPY W/  URETERAL STENT PLACEMENT  04/17/2011   Procedure: CYSTOSCOPY WITH RETROGRADE PYELOGRAM/URETERAL STENT PLACEMENT;  Surgeon: Hanley Ben, MD;  Location: [facility];  Service: Urology;  Laterality: Left;  . CYSTOSCOPY WITH RETROGRADE PYELOGRAM, URETEROSCOPY AND STENT PLACEMENT Left 04/18/2013   Procedure: CYSTOSCOPY WITH LEFT RETROGRADE PYELOGRAM, LEFT URETEROSCOPY AND LASER LITHOTRIPSY LEFT STENT PLACEMENT;  Surgeon: Dutch Gray, MD;  [facility];  Service: Urology;  Laterality: Left;  . HOLMIUM LASER APPLICATION Left 8/0/3212   Procedure: HOLMIUM LASER APPLICATION;  Surgeon: Dutch Gray, MD;  Location: [facility];  Service: Urology;  Laterality: Left;  . IRRIGATION AND DEBRIDEMENT ABSCESS Left 10/05/2014   Procedure: IAND D LEFT INDEX FINGER;  Surgeon: Dayna Barker, MD;  Location: [facility];  Service: Plastics;  Laterality: Left;  . KNEE SURGERY     bilateral ARTHROSCOPY X2 TO EACH KNEE  . MELANOMA EXCISION  2018  . S/P cysto & stents for kidnedy stone 11/08 by Dr. Reece Agar    . 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 SHOULDER SURGERY  04/2005   left by Dr. Berenice Primas    MEDICATIONS: . amLODipine (NORVASC) 10 MG tablet  . aspirin EC 81 MG tablet  . carvedilol (COREG) 12.5 MG tablet  . COD LIVER OIL PO  . cyanocobalamin (,VITAMIN B-12,) 1000 MCG/ML injection  . diclofenac sodium (VOLTAREN) 1 % GEL  . glimepiride (AMARYL) 4 MG tablet  . hydrALAZINE (APRESOLINE) 50 MG tablet  . INS SYRINGE/NEEDLE 1CC/28G (B-D INSULIN SYRINGE 1CC/28G) 28G X 1/2" 1 ML MISC  . Insulin Glargine, 1 Unit Dial, ([name] SOLOSTAR) 300 UNIT/ML SOPN  . insulin lispro (HUMALOG KWIKPEN) 100 UNIT/ML KwikPen  . lisinopril-hydrochlorothiazide (PRINZIDE,ZESTORETIC) 20-25 MG tablet  . meclizine (ANTIVERT) 25 MG tablet  . naproxen sodium (ALEVE) 220 MG tablet  . NOVOFINE 30G X 8 MM MISC  . ONE TOUCH ULTRA TEST test strip  . oxymetazoline (AFRIN) 0.05 % nasal spray  .  potassium chloride SA (K-DUR,KLOR-CON) 20 MEQ tablet  . Pseudoeph-Doxylamine-DM-APAP (NYQUIL PO)  . tamsulosin (FLOMAX) 0.4 MG CAPS capsule  . traMADol (ULTRAM) 50 MG tablet   No current facility-administered medications for this encounter.      Maia Plan [facility] [phone] 03/17/18 11:58 AM

## 2018-03-22 ENCOUNTER — Encounter (HOSPITAL_COMMUNITY): Admission: RE | Payer: Self-pay | Source: Home / Self Care

## 2018-03-22 ENCOUNTER — Ambulatory Visit (HOSPITAL_COMMUNITY): Admission: RE | Admit: 2018-03-22 | Payer: Medicare Other | Source: Home / Self Care | Admitting: Orthopedic Surgery

## 2018-03-22 LAB — TYPE AND SCREEN
ABO/RH(D): O POS
Antibody Screen: NEGATIVE

## 2018-03-22 SURGERY — ARTHROPLASTY, KNEE, TOTAL
Anesthesia: Spinal | Laterality: Left

## 2018-03-25 ENCOUNTER — Telehealth: Payer: Self-pay | Admitting: Family Medicine

## 2018-03-25 NOTE — Telephone Encounter (Signed)
Pt daughter (joy) mailed FMLA paperwork to be filled out In dr duncan's in box for review and signature

## 2018-03-26 NOTE — Telephone Encounter (Signed)
I will work on the hardcopy.  Thanks. 

## 2018-04-01 NOTE — Telephone Encounter (Signed)
Paperwork faxed  Left message with joy that her paperwork had been faxed and a copy is here for her.  Copy for pt Copy for scan

## 2018-04-06 ENCOUNTER — Telehealth: Payer: Self-pay | Admitting: Family Medicine

## 2018-04-06 NOTE — Telephone Encounter (Signed)
Form placed in Dr. Duncan's In Box. 

## 2018-04-06 NOTE — Telephone Encounter (Signed)
Pt dropped off disability parking form to be filled out by Dr. Damita Dunnings. Please call when ready or pt will pick up at appt on 04/27/18. Placed in Rx tower for Dr. Damita Dunnings.

## 2018-04-07 NOTE — Telephone Encounter (Signed)
I will work on the hardcopy.  Thanks. 

## 2018-04-13 ENCOUNTER — Encounter: Payer: Self-pay | Admitting: *Deleted

## 2018-04-22 DIAGNOSIS — Z961 Presence of intraocular lens: Secondary | ICD-10-CM | POA: Diagnosis not present

## 2018-04-22 DIAGNOSIS — H25812 Combined forms of age-related cataract, left eye: Secondary | ICD-10-CM | POA: Diagnosis not present

## 2018-04-22 DIAGNOSIS — E119 Type 2 diabetes mellitus without complications: Secondary | ICD-10-CM | POA: Diagnosis not present

## 2018-04-22 DIAGNOSIS — H40013 Open angle with borderline findings, low risk, bilateral: Secondary | ICD-10-CM | POA: Diagnosis not present

## 2018-04-22 DIAGNOSIS — H26491 Other secondary cataract, right eye: Secondary | ICD-10-CM | POA: Diagnosis not present

## 2018-04-22 LAB — HM DIABETES EYE EXAM

## 2018-04-27 ENCOUNTER — Other Ambulatory Visit: Payer: Self-pay

## 2018-04-27 ENCOUNTER — Ambulatory Visit (INDEPENDENT_AMBULATORY_CARE_PROVIDER_SITE_OTHER): Payer: Medicare Other | Admitting: Family Medicine

## 2018-04-27 ENCOUNTER — Encounter: Payer: Self-pay | Admitting: Family Medicine

## 2018-04-27 VITALS — BP 128/56 | HR 77 | Temp 97.7°F | Ht 69.0 in | Wt 229.1 lb

## 2018-04-27 DIAGNOSIS — E119 Type 2 diabetes mellitus without complications: Secondary | ICD-10-CM

## 2018-04-27 DIAGNOSIS — E1159 Type 2 diabetes mellitus with other circulatory complications: Secondary | ICD-10-CM | POA: Diagnosis not present

## 2018-04-27 LAB — POCT GLYCOSYLATED HEMOGLOBIN (HGB A1C): Hemoglobin A1C: 8.9 % — AB (ref 4.0–5.6)

## 2018-04-27 MED ORDER — POTASSIUM CHLORIDE CRYS ER 20 MEQ PO TBCR: 20.0000 meq | EXTENDED_RELEASE_TABLET | Freq: Every day | ORAL | Status: DC

## 2018-04-27 MED ORDER — CARVEDILOL 12.5 MG PO TABS: 12.5000 mg | ORAL_TABLET | Freq: Two times a day (BID) | ORAL | Status: DC

## 2018-04-27 MED ORDER — INSULIN LISPRO (1 UNIT DIAL) 100 UNIT/ML (KWIKPEN): 4.0000 [IU] | PEN_INJECTOR | Freq: Three times a day (TID) | SUBCUTANEOUS | Status: DC

## 2018-04-27 MED ORDER — INSULIN GLARGINE (1 UNIT DIAL) 300 UNIT/ML ~~LOC~~ SOPN: 12.0000 [IU] | PEN_INJECTOR | Freq: Every day | SUBCUTANEOUS | Status: DC

## 2018-04-27 MED ORDER — GLIMEPIRIDE 4 MG PO TABS: 2.0000 mg | ORAL_TABLET | Freq: Every day | ORAL | Status: DC

## 2018-04-27 MED ORDER — AMLODIPINE BESYLATE 10 MG PO TABS: 10.0000 mg | ORAL_TABLET | Freq: Every day | ORAL | Status: DC

## 2018-04-27 MED ORDER — HYDRALAZINE HCL 50 MG PO TABS: 50.0000 mg | ORAL_TABLET | Freq: Three times a day (TID) | ORAL | Status: DC

## 2018-04-27 MED ORDER — LISINOPRIL-HYDROCHLOROTHIAZIDE 20-25 MG PO TABS: 1.0000 | ORAL_TABLET | Freq: Every day | ORAL | Status: DC

## 2018-04-27 MED ORDER — TAMSULOSIN HCL 0.4 MG PO CAPS: 0.4000 mg | ORAL_CAPSULE | Freq: Every day | ORAL | Status: DC

## 2018-04-27 NOTE — Progress Notes (Signed)
Diabetes:  Using medications without difficulties: yes Hypoglycemic episodes: no Hyperglycemic episodes:no Feet problems:no Blood Sugars averaging: usually 150-200 in the AMs eye exam within last year: yes, Dr. Katy Fitch, last week.  Taking 12 units at night of toujeo Still using SSI at mealtime.  Lowest sugar in the last few weeks was 126 and that was an AM sugar.   He is cutting out sweets.   Goal A1c ~8, d/w pt.   Meds, vitals, and allergies reviewed.   ROS: Per HPI unless specifically indicated in ROS section   GEN: nad, alert and oriented HEENT: mucous membranes moist NECK: supple w/o LA CV: rrr. PULM: ctab, no inc wob ABD: soft, +bs EXT: no edema SKIN: well perfused.

## 2018-04-27 NOTE — Assessment & Plan Note (Signed)
Taking 12 units at night of toujeo Still using SSI at mealtime.  Lowest sugar in the last few weeks was 126 and that was an AM sugar.   He is cutting out sweets.   Goal A1c ~8, d/w pt.  A1c better with diet changes.   No change in meds. Recheck in about 3 months.  Update me as needed. He agrees.

## 2018-04-27 NOTE — Patient Instructions (Signed)
Don't change your meds for now.  Keep adjusting your mealtime insulin based on sliding scale.    Plan on recheck in 3 months with labs at the visit.  Take care.  Glad to see you.  If you get sick, then call us.

## 2018-05-07 ENCOUNTER — Encounter: Payer: Self-pay | Admitting: Family Medicine

## 2018-05-11 ENCOUNTER — Telehealth: Payer: Self-pay

## 2018-05-11 NOTE — Telephone Encounter (Signed)
I called pt's son [name].  I didn't given confidential info about [name].   Info per patient's son.   Pt's daughter is hospitalized, tested positive with covid.   [name] is looking after his father.  They two of them have no sx.     Daughter got sick ~3/27.  She last had contact with patient 3/19 and she didn't have sx at that point.   Encouraged patient and son to stay at home for now, report back as needed.  Son already cleaned the house appropriately.   I thanked [name] for his effort.  He was correct to limit his father's exposure and to clean the house.   He thanked me for the call.   I thank all involved.

## 2018-05-11 NOTE — Telephone Encounter (Signed)
Received inbound call from patient's son Legrand Como that patient may have been exposed to COVID-19 by his daughter. Daughter is currently hospitalized at [facility]. Per son Legrand Como, patient is not exhibiting any respiratory symptoms at this time.   Son can be contacted @ [phone] if there any questions or concerns.

## 2018-05-12 ENCOUNTER — Ambulatory Visit (INDEPENDENT_AMBULATORY_CARE_PROVIDER_SITE_OTHER): Payer: Medicare Other | Admitting: Family Medicine

## 2018-05-12 ENCOUNTER — Telehealth: Payer: Self-pay | Admitting: Family Medicine

## 2018-05-12 ENCOUNTER — Encounter: Payer: Self-pay | Admitting: Family Medicine

## 2018-05-12 ENCOUNTER — Telehealth: Payer: Self-pay

## 2018-05-12 ENCOUNTER — Other Ambulatory Visit: Payer: Self-pay

## 2018-05-12 DIAGNOSIS — Z20822 Contact with and (suspected) exposure to covid-19: Secondary | ICD-10-CM | POA: Insufficient documentation

## 2018-05-12 DIAGNOSIS — Z20828 Contact with and (suspected) exposure to other viral communicable diseases: Secondary | ICD-10-CM

## 2018-05-12 MED ORDER — HYDROXYZINE HCL 10 MG PO TABS: 5.0000 mg | ORAL_TABLET | Freq: Three times a day (TID) | ORAL | 0 refills | Status: DC | PRN

## 2018-05-12 NOTE — Progress Notes (Signed)
See phone note. No charge for call.

## 2018-05-12 NOTE — Telephone Encounter (Signed)
Noted. Thanks.

## 2018-05-12 NOTE — Telephone Encounter (Signed)
Received inbound call from patient's son, Hoover Browns. Patient's daughter has expired due to COVID-19. Son has requested patient have a prescription for anxiety.  Scheduled telephone call for 05/12/18 @ 1600.

## 2018-05-12 NOTE — Telephone Encounter (Signed)
Call patient.  He is at home with his son, [name].  I talked with both of them on the phone.  The patient gave me permission to talk to his son by phone.  Update on recent situation.  Patient's daughter just died of coronavirus.  Condolences offered to patient and his son.  Both thanked me for the call.  Son is staying at home with his father in the meantime.  [name] most recently had contact with his daughter on 3/19.  His son was asking if it is possible to get [name] tested for coronavirus at this point.  The patient does not have any fever or cough or symptoms suggestive of coronavirus.  I need to know if we have any swabs at the office that we can use, or if there is any way to get this patient tested.  Please let me know.  [name] is safe at home but this is clearly been an awful event in his life.  His son was asking if a prescription could be called in for [name] an as needed medication for anxiety.  Discussed options.  Reasonable to use hydroxyzine if needed with sedation caution.  He can start with 5 mg at a time if needed.  They will update me as needed.  I thank all involved.  I told them both that I was profoundly sorry for their loss.

## 2018-05-13 NOTE — Telephone Encounter (Addendum)
Spoke with Dr. Damita Dunnings today regarding this situation.  After extensively discussing what is best for the patient and following proper guidelines for testing, Dr. Damita Dunnings will review with patient and son appropriate next steps.    These steps include to continue with quarantine process as patient has 2 more days before exhausting the 14 day window and is not symptomatic at this point.  We will monitor him from office, making wellness calls every few days to be available to monitor condition and to field any questions or concerns, but at this point it would be more dangerous to expose him further and testing would not be in his best interest.    Dr. Damita Dunnings to call and discuss directly with son.

## 2018-05-14 NOTE — Telephone Encounter (Signed)
Late entry.  Talked to son by phone yesterday.  It would be riskier for patient to come to clinic to get tested at this point.  He doesn't need extra exposure to others and the quarantine period would be completed prior to test result availability, so it makes sense to defer testing.  Son agreed, thanked me for the call, he'll update patient about our conversation.   He'll update me as needed.  I thank all involved.

## 2018-06-04 ENCOUNTER — Other Ambulatory Visit: Payer: Self-pay | Admitting: Family Medicine

## 2018-06-04 NOTE — Telephone Encounter (Signed)
Carvedilol refilled today. During office visit on 04/27/2018 this medication was updated but not actually refilled.

## 2018-06-09 ENCOUNTER — Other Ambulatory Visit: Payer: Self-pay | Admitting: Family Medicine

## 2018-07-13 ENCOUNTER — Other Ambulatory Visit: Payer: Self-pay | Admitting: Family Medicine

## 2018-07-29 ENCOUNTER — Ambulatory Visit (INDEPENDENT_AMBULATORY_CARE_PROVIDER_SITE_OTHER): Payer: Medicare Other | Admitting: Family Medicine

## 2018-07-29 ENCOUNTER — Encounter: Payer: Self-pay | Admitting: Family Medicine

## 2018-07-29 ENCOUNTER — Other Ambulatory Visit: Payer: Self-pay

## 2018-07-29 VITALS — BP 156/66 | HR 66 | Temp 98.0°F | Ht 69.0 in | Wt 228.0 lb

## 2018-07-29 DIAGNOSIS — E1159 Type 2 diabetes mellitus with other circulatory complications: Secondary | ICD-10-CM

## 2018-07-29 DIAGNOSIS — E119 Type 2 diabetes mellitus without complications: Secondary | ICD-10-CM | POA: Diagnosis not present

## 2018-07-29 LAB — POCT GLYCOSYLATED HEMOGLOBIN (HGB A1C): Hemoglobin A1C: 8.6 % — AB (ref 4.0–5.6)

## 2018-07-29 NOTE — Progress Notes (Signed)
Diabetes:  Using medications without difficulties: yes Hypoglycemic episodes:no Hyperglycemic episodes: no Feet problems: no Blood Sugars averaging: 140-200 eye exam within last year: yes A1c d/w pt at OV.  8.6.   His daughter died earlier this year,  Condolences offered.   Meds, vitals, and allergies reviewed.   ROS: Per HPI unless specifically indicated in ROS section   GEN: nad, alert and oriented HEENT: mucous membranes moist NECK: supple w/o LA CV: rrr. PULM: ctab, no inc wob ABD: soft, +bs EXT: no edema SKIN: no acute rash

## 2018-07-29 NOTE — Patient Instructions (Signed)
Recheck in about 3 months like today.  A1c at the visit.   Don't change your meds for now.  Update me as needed.  Take care.  Glad to see you.

## 2018-08-01 NOTE — Assessment & Plan Note (Signed)
A1c d/w pt at OV.  8.6.  Goal A1c in the eights.  No change in meds at this point.  He agrees.  Goal to limit hypoglycemia.  Update me as needed.  See after visit summary.

## 2018-08-28 ENCOUNTER — Other Ambulatory Visit: Payer: Self-pay | Admitting: Family Medicine

## 2018-09-30 ENCOUNTER — Other Ambulatory Visit: Payer: Self-pay | Admitting: Family Medicine

## 2018-10-01 ENCOUNTER — Other Ambulatory Visit: Payer: Self-pay

## 2018-10-01 MED ORDER — CARVEDILOL 12.5 MG PO TABS: ORAL_TABLET | ORAL | 0 refills | Status: DC

## 2018-10-01 NOTE — Telephone Encounter (Signed)
Patient's son calls and requests a 7 day r/x of medication to carry patient over until his mail order supply comes in of Carvedilol.  Patient is completely out and will be 1 week until carvedilol comes in mail.  Will send 7 days worth in to pharmacy to carry patient over.

## 2018-10-04 ENCOUNTER — Other Ambulatory Visit: Payer: Self-pay | Admitting: Family Medicine

## 2018-10-19 DIAGNOSIS — M170 Bilateral primary osteoarthritis of knee: Secondary | ICD-10-CM | POA: Diagnosis not present

## 2018-10-22 ENCOUNTER — Telehealth: Payer: Self-pay

## 2018-10-22 DIAGNOSIS — E538 Deficiency of other specified B group vitamins: Secondary | ICD-10-CM

## 2018-10-22 NOTE — Telephone Encounter (Signed)
Left detailed message on voicemail.  

## 2018-10-22 NOTE — Telephone Encounter (Signed)
[name] (do not see DPR signed for [name]) left v/m requesting cb; pt has changed from [facility] to [place] needs all new rx for pt. [name] request cb. Last seen 07/29/18 for 3 mth FU.

## 2018-10-25 ENCOUNTER — Other Ambulatory Visit: Payer: Self-pay

## 2018-10-25 ENCOUNTER — Ambulatory Visit (INDEPENDENT_AMBULATORY_CARE_PROVIDER_SITE_OTHER): Payer: Medicare HMO | Admitting: Family Medicine

## 2018-10-25 ENCOUNTER — Encounter: Payer: Self-pay | Admitting: Family Medicine

## 2018-10-25 VITALS — BP 142/76 | HR 85 | Temp 98.1°F | Ht 69.0 in | Wt 223.0 lb

## 2018-10-25 DIAGNOSIS — Z23 Encounter for immunization: Secondary | ICD-10-CM

## 2018-10-25 DIAGNOSIS — E1159 Type 2 diabetes mellitus with other circulatory complications: Secondary | ICD-10-CM

## 2018-10-25 DIAGNOSIS — M170 Bilateral primary osteoarthritis of knee: Secondary | ICD-10-CM | POA: Diagnosis not present

## 2018-10-25 LAB — POCT GLYCOSYLATED HEMOGLOBIN (HGB A1C): Hemoglobin A1C: 7.9 % — AB (ref 4.0–5.6)

## 2018-10-25 MED ORDER — TRAMADOL HCL 50 MG PO TABS: 50.0000 mg | ORAL_TABLET | Freq: Two times a day (BID) | ORAL | 2 refills | Status: DC | PRN

## 2018-10-25 NOTE — Progress Notes (Signed)
Pre visit review using our clinic review tool, if applicable. No additional management support is needed unless otherwise documented below in the visit note. 

## 2018-10-25 NOTE — Progress Notes (Signed)
Diabetes:  Using medications without difficulties: yes Hypoglycemic episodes:no Hyperglycemic episodes:no Feet problems: no Blood Sugars averaging: usually <200, no lows.  occ down 150s.  Usually 150-170s.   eye exam within last year: yes  Flu shot today.    He is off vistaril.  Anxiety is better.    Tramadol helped his knee pain, s/p injection.  He was trying to limit aleve use.   Meds, vitals, and allergies reviewed.   ROS: Per HPI unless specifically indicated in ROS section   GEN: nad, alert and oriented HEENT: ncat NECK: supple w/o LA CV: rrr. PULM: ctab, no inc wob ABD: soft, +bs EXT: no edema SKIN: no acute rash  Diabetic foot exam: Normal inspection No skin breakdown No calluses  Normal DP pulses Normal sensation to light touch and monofilament Nails thickened.

## 2018-10-25 NOTE — Patient Instructions (Addendum)
Try tramadol for knee and hand pain instead of aleve.  Recheck in 12/2018 as scheduled.  Labs ahead of time.   Take care.  Glad to see you.

## 2018-10-26 NOTE — Telephone Encounter (Signed)
Patient had appointment on Monday and was told to let Dr. Damita Dunnings know about any changes.  Patient stated he was not aware of any changes.  Legrand Como does not have DPR and I'm not sure what needs to be done.

## 2018-10-26 NOTE — Telephone Encounter (Signed)
Patient's son left a voicemail requesting a call back about refills for his dad.  [name] stated that he has changed to a new pharmacy [facility].

## 2018-10-27 NOTE — Telephone Encounter (Signed)
Please try to check with the patient since we do not have a DPR for his son.  Let me know what refills he needs and I can send those in.  He was going to try tramadol instead of Aleve for pain.  Please let me know how that goes.  Thanks.

## 2018-10-27 NOTE — Assessment & Plan Note (Signed)
Discussed with patient about use of tramadol for knee and hand pain instead of aleve.  Update me as needed.  Routine cautions given.  He agreed.

## 2018-10-27 NOTE — Assessment & Plan Note (Signed)
Blood Sugars averaging: usually <200, no lows.  occ down 150s.  Usually 150-170s.   eye exam within last year: yes Flu shot today.   A1c improved.  No change in meds at this point.  Recheck periodically.  He agrees.  See after visit summary.

## 2018-10-28 NOTE — Telephone Encounter (Signed)
Patient has switched from [facility] to [facility] and needs all meds sent to [facility].

## 2018-10-29 MED ORDER — TAMSULOSIN HCL 0.4 MG PO CAPS: ORAL_CAPSULE | ORAL | 3 refills | Status: DC

## 2018-10-29 MED ORDER — LISINOPRIL-HYDROCHLOROTHIAZIDE 20-25 MG PO TABS: 1.0000 | ORAL_TABLET | Freq: Every morning | ORAL | 3 refills | Status: DC

## 2018-10-29 MED ORDER — GLIMEPIRIDE 4 MG PO TABS: 2.0000 mg | ORAL_TABLET | Freq: Every day | ORAL | 3 refills | Status: DC

## 2018-10-29 MED ORDER — NOVOFINE 30G X 8 MM MISC: 3 refills | Status: DC

## 2018-10-29 MED ORDER — INSULIN LISPRO (1 UNIT DIAL) 100 UNIT/ML (KWIKPEN): 4.0000 [IU] | PEN_INJECTOR | Freq: Three times a day (TID) | SUBCUTANEOUS | 3 refills | Status: DC

## 2018-10-29 MED ORDER — ONETOUCH ULTRA VI STRP: ORAL_STRIP | 1 refills | Status: DC

## 2018-10-29 MED ORDER — TOUJEO SOLOSTAR 300 UNIT/ML ~~LOC~~ SOPN: 12.0000 [IU] | PEN_INJECTOR | Freq: Every day | SUBCUTANEOUS | 2 refills | Status: DC

## 2018-10-29 MED ORDER — AMLODIPINE BESYLATE 10 MG PO TABS: 10.0000 mg | ORAL_TABLET | Freq: Every morning | ORAL | 3 refills | Status: DC

## 2018-10-29 MED ORDER — DICLOFENAC SODIUM 1 % TD GEL: 2.0000 g | Freq: Four times a day (QID) | TRANSDERMAL | 12 refills | Status: DC

## 2018-10-29 MED ORDER — CARVEDILOL 12.5 MG PO TABS: ORAL_TABLET | ORAL | 3 refills | Status: DC

## 2018-10-29 MED ORDER — HYDRALAZINE HCL 50 MG PO TABS: 50.0000 mg | ORAL_TABLET | Freq: Three times a day (TID) | ORAL | 3 refills | Status: DC

## 2018-10-29 MED ORDER — POTASSIUM CHLORIDE CRYS ER 20 MEQ PO TBCR: 20.0000 meq | EXTENDED_RELEASE_TABLET | Freq: Every day | ORAL | 3 refills | Status: DC

## 2018-10-29 MED ORDER — CYANOCOBALAMIN 1000 MCG/ML IJ SOLN: 1000.0000 ug | INTRAMUSCULAR | 3 refills | Status: DC

## 2018-10-29 MED ORDER — "INSULIN SYRINGE/NEEDLE 28G X 1/2"" 1 ML MISC": 0 refills | Status: DC

## 2018-10-29 NOTE — Telephone Encounter (Signed)
Sent. Thanks.   

## 2018-10-29 NOTE — Addendum Note (Signed)
Addended by: Tonia Ghent on: 10/29/2018 06:50 AM   Modules accepted: Orders

## 2018-11-05 ENCOUNTER — Other Ambulatory Visit: Payer: Self-pay | Admitting: *Deleted

## 2018-11-05 MED ORDER — "SYRINGE 25G X 1"" 3 ML MISC": 0 refills | Status: DC

## 2018-11-12 ENCOUNTER — Telehealth: Payer: Self-pay | Admitting: *Deleted

## 2018-11-12 NOTE — Telephone Encounter (Signed)
Received fax from [facility] requesting PA for One Touch Ultra Test Strips.  PA completed on CoverMyMeds.  Sent to [facility] for review.  Can take up to 72 hours for a decision.

## 2018-11-26 ENCOUNTER — Other Ambulatory Visit: Payer: Self-pay

## 2018-11-26 MED ORDER — ACCU-CHEK AVIVA VI SOLN: 3 refills | Status: DC

## 2018-11-26 MED ORDER — ACCU-CHEK AVIVA PLUS W/DEVICE KIT: PACK | 3 refills | Status: DC

## 2018-11-26 MED ORDER — ACCU-CHEK AVIVA PLUS VI STRP: ORAL_STRIP | 3 refills | Status: DC

## 2018-11-26 MED ORDER — ACCU-CHEK SOFTCLIX LANCETS MISC: 3 refills | Status: DC

## 2018-11-26 NOTE — Telephone Encounter (Signed)
RXs for testing supplies

## 2018-11-30 ENCOUNTER — Other Ambulatory Visit: Payer: Self-pay | Admitting: *Deleted

## 2018-11-30 MED ORDER — ACCU-CHEK AVIVA PLUS VI STRP: ORAL_STRIP | 3 refills | Status: DC

## 2018-11-30 MED ORDER — ACCU-CHEK AVIVA PLUS W/DEVICE KIT: PACK | 0 refills | Status: DC

## 2018-11-30 MED ORDER — ACCU-CHEK SOFTCLIX LANCETS MISC: 3 refills | Status: DC

## 2018-11-30 MED ORDER — ACCU-CHEK AVIVA VI SOLN: 3 refills | Status: DC

## 2018-12-06 ENCOUNTER — Other Ambulatory Visit: Payer: Self-pay | Admitting: *Deleted

## 2018-12-06 MED ORDER — ACCU-CHEK AVIVA VI SOLN: 3 refills | Status: DC

## 2018-12-27 ENCOUNTER — Other Ambulatory Visit: Payer: Self-pay | Admitting: Family Medicine

## 2018-12-27 ENCOUNTER — Telehealth: Payer: Self-pay

## 2018-12-27 ENCOUNTER — Other Ambulatory Visit: Payer: Self-pay

## 2018-12-27 ENCOUNTER — Other Ambulatory Visit (INDEPENDENT_AMBULATORY_CARE_PROVIDER_SITE_OTHER): Payer: Medicare HMO

## 2018-12-27 ENCOUNTER — Ambulatory Visit: Payer: Medicare HMO

## 2018-12-27 ENCOUNTER — Ambulatory Visit: Payer: Medicare Other

## 2018-12-27 DIAGNOSIS — E1159 Type 2 diabetes mellitus with other circulatory complications: Secondary | ICD-10-CM

## 2018-12-27 DIAGNOSIS — R7989 Other specified abnormal findings of blood chemistry: Secondary | ICD-10-CM

## 2018-12-27 DIAGNOSIS — E538 Deficiency of other specified B group vitamins: Secondary | ICD-10-CM | POA: Diagnosis not present

## 2018-12-27 LAB — COMPREHENSIVE METABOLIC PANEL
ALT: 15 U/L (ref 0–53)
AST: 12 U/L (ref 0–37)
Albumin: 4.2 g/dL (ref 3.5–5.2)
Alkaline Phosphatase: 68 U/L (ref 39–117)
BUN: 18 mg/dL (ref 6–23)
CO2: 31 mEq/L (ref 19–32)
Calcium: 9.6 mg/dL (ref 8.4–10.5)
Chloride: 101 mEq/L (ref 96–112)
Creatinine, Ser: 1.18 mg/dL (ref 0.40–1.50)
GFR: 59.1 mL/min — ABNORMAL LOW (ref >60.00)
Glucose, Bld: 231 mg/dL — ABNORMAL HIGH (ref 70–99)
Potassium: 4 mEq/L (ref 3.5–5.1)
Sodium: 140 mEq/L (ref 135–145)
Total Bilirubin: 0.6 mg/dL (ref 0.2–1.2)
Total Protein: 6.5 g/dL (ref 6.0–8.3)

## 2018-12-27 LAB — LIPID PANEL
Cholesterol: 166 mg/dL (ref 0–200)
HDL: 32.9 mg/dL — ABNORMAL LOW (ref >39.00)
NonHDL: 133.16
Total CHOL/HDL Ratio: 5
Triglycerides: 254 mg/dL — ABNORMAL HIGH (ref 0.0–149.0)
VLDL: 50.8 mg/dL — ABNORMAL HIGH (ref 0.0–40.0)

## 2018-12-27 LAB — LDL CHOLESTEROL, DIRECT: Direct LDL: 93 mg/dL

## 2018-12-27 LAB — TSH: TSH: 3.2 u[IU]/mL (ref 0.35–4.50)

## 2018-12-27 LAB — HEMOGLOBIN A1C: Hgb A1c MFr Bld: 8.3 % — ABNORMAL HIGH (ref 4.6–6.5)

## 2018-12-27 LAB — VITAMIN B12: Vitamin B-12: 293 pg/mL (ref 211–911)

## 2018-12-27 NOTE — Telephone Encounter (Signed)
Called patient to complete his Medicare Wellness visit and patient did not have time to do the visit. He stated that he wanted to complete the visit with the provider at his upcoming physical. Appointment was cancelled per patient request.

## 2018-12-28 ENCOUNTER — Other Ambulatory Visit: Payer: Medicare HMO

## 2018-12-31 ENCOUNTER — Ambulatory Visit (INDEPENDENT_AMBULATORY_CARE_PROVIDER_SITE_OTHER): Payer: Medicare HMO | Admitting: Family Medicine

## 2018-12-31 ENCOUNTER — Other Ambulatory Visit: Payer: Self-pay

## 2018-12-31 ENCOUNTER — Encounter: Payer: Self-pay | Admitting: Family Medicine

## 2018-12-31 VITALS — BP 118/58 | HR 70 | Temp 97.8°F | Ht 69.0 in | Wt 227.5 lb

## 2018-12-31 DIAGNOSIS — E538 Deficiency of other specified B group vitamins: Secondary | ICD-10-CM | POA: Diagnosis not present

## 2018-12-31 DIAGNOSIS — I10 Essential (primary) hypertension: Secondary | ICD-10-CM

## 2018-12-31 DIAGNOSIS — Z0000 Encounter for general adult medical examination without abnormal findings: Secondary | ICD-10-CM

## 2018-12-31 DIAGNOSIS — E1159 Type 2 diabetes mellitus with other circulatory complications: Secondary | ICD-10-CM

## 2018-12-31 DIAGNOSIS — E785 Hyperlipidemia, unspecified: Secondary | ICD-10-CM

## 2018-12-31 DIAGNOSIS — Z7189 Other specified counseling: Secondary | ICD-10-CM

## 2018-12-31 NOTE — Progress Notes (Signed)
This visit occurred during the SARS-CoV-2 public health emergency.  Safety protocols were in place, including screening questions prior to the visit, additional usage of staff PPE, and extensive cleaning of exam room while observing appropriate contact time as indicated for disinfecting solutions.    I have personally reviewed the Medicare Annual Wellness questionnaire and have noted 1. The patient's medical and social history 2. Their use of alcohol, tobacco or illicit drugs 3. Their current medications and supplements 4. The patient's functional ability including ADL's, fall risks, home safety risks and hearing or visual             impairment. 5. Diet and physical activities 6. Evidence for depression or mood disorders  The patients weight, height, BMI have been recorded in the chart and visual acuity is per eye clinic.  I have made referrals, counseling and provided education to the patient based review of the above and I have provided the pt with a written personalized care plan for preventive services.  Provider list updated- see scanned forms.  Routine anticipatory guidance given to patient.  See health maintenance. The possibility exists that previously documented standard health maintenance information may have been brought forward from a previous encounter into this note.  If needed, that same information has been updated to reflect the current situation based on today's encounter.    Flu 2020 Shingles discussed with patient PNA up-to-date Tetanus 2016 Colon cancer screening not due given age greater than 58. Prostate cancer screening not due given age greater than 64.  Patient agrees. Living will d/w pt. Would have his son Legrand Como designated if patient were incapacitated.  Cognitive function addressed- see scanned forms- and if abnormal then additional documentation follows.   Prev gait/motor changes resolved in the past w/o clear dx prev.  Discussed deferring statin given his  previous motor history.  I do not want to induce a statin related myopathy.  H/o melanoma. D/w pt about routine f/u.  See after visit summary.  Hypertension:    Using medication without problems or lightheadedness:yes  Chest pain with exertion:no Edema:no Short of breath:no  B12.  Still on IM replacement.  Level wnl.  D/w pt. compliant.  Diabetes:  Using medications without difficulties:yes Hypoglycemic episodes:no Hyperglycemic episodes:no Feet problems:no Blood Sugars averaging: 180-200 in the AM.  eye exam within last year: labs d/w pt.   Goal A1c ~8 given his age, d/w pt.    PMH and SH reviewed  Meds, vitals, and allergies reviewed.   ROS: Per HPI.  Unless specifically indicated otherwise in HPI, the patient denies:  General: fever. Eyes: acute vision changes ENT: sore throat Cardiovascular: chest pain Respiratory: SOB GI: vomiting GU: dysuria Musculoskeletal: acute back pain Derm: acute rash Neuro: acute motor dysfunction Psych: worsening mood Endocrine: polydipsia Heme: bleeding Allergy: hayfever  GEN: nad, alert and oriented HEENT: ncat NECK: supple w/o LA CV: rrr. PULM: ctab, no inc wob ABD: soft, +bs EXT: no edema SKIN: no acute rash  Diabetic foot exam: Normal inspection No skin breakdown No calluses  Normal DP pulses Normal sensation to light touch and monofilament Nails thickened.   Health Maintenance  Topic Date Due  . OPHTHALMOLOGY EXAM  04/22/2019  . HEMOGLOBIN A1C  06/26/2019  . FOOT EXAM  10/25/2019  . TETANUS/TDAP  08/14/2024  . INFLUENZA VACCINE  Completed  . PNA vac Low Risk Adult  Completed

## 2018-12-31 NOTE — Patient Instructions (Addendum)
Call about follow up at the skin clinic at [facility].  If you need a referral then let me know.   Check with your insurance to see if they will cover the shingrix shot. Assuming you are not having troubles with your sugar, then recheck in 6 months with A1c at the visit.  You wouldn't need labs ahead of time.  Update me as needed.  Take care.  Glad to see you.

## 2019-01-03 DIAGNOSIS — Z0000 Encounter for general adult medical examination without abnormal findings: Secondary | ICD-10-CM | POA: Insufficient documentation

## 2019-01-03 NOTE — Assessment & Plan Note (Signed)
See above.  I do not want to induce a statin related myopathy.

## 2019-01-03 NOTE — Assessment & Plan Note (Signed)
B12.  Still on IM replacement.  Level wnl.  D/w pt. Compliant.  Continue as is.  He agrees.

## 2019-01-03 NOTE — Assessment & Plan Note (Addendum)
Prev gait/motor changes resolved in the past w/o clear dx prev.  Discussed deferring statin given his previous motor history.  I do not want to induce a statin related myopathy. Goal A1c ~8 given his age, d/w pt.   See after visit summary.  No change in meds at this point.

## 2019-01-03 NOTE — Assessment & Plan Note (Signed)
Flu 2020 Shingles discussed with patient PNA up-to-date Tetanus 2016 Colon cancer screening not due given age greater than 30. Prostate cancer screening not due given age greater than 44.  Patient agrees. Living will d/w pt. Would have his son Legrand Como designated if patient were incapacitated.  Cognitive function addressed- see scanned forms- and if abnormal then additional documentation follows.

## 2019-01-03 NOTE — Assessment & Plan Note (Signed)
Living will d/w pt. Would have his son [name] designated if patient were incapacitated.

## 2019-01-03 NOTE — Assessment & Plan Note (Signed)
Controlled.  No change in meds at this point.  Labs discussed with patient.  He agrees.

## 2019-02-02 ENCOUNTER — Other Ambulatory Visit: Payer: Self-pay | Admitting: Family Medicine

## 2019-06-21 ENCOUNTER — Other Ambulatory Visit: Payer: Self-pay | Admitting: Family Medicine

## 2019-06-23 DIAGNOSIS — Z961 Presence of intraocular lens: Secondary | ICD-10-CM | POA: Diagnosis not present

## 2019-06-23 DIAGNOSIS — E119 Type 2 diabetes mellitus without complications: Secondary | ICD-10-CM | POA: Diagnosis not present

## 2019-06-23 DIAGNOSIS — H40013 Open angle with borderline findings, low risk, bilateral: Secondary | ICD-10-CM | POA: Diagnosis not present

## 2019-06-23 DIAGNOSIS — H26491 Other secondary cataract, right eye: Secondary | ICD-10-CM | POA: Diagnosis not present

## 2019-06-23 DIAGNOSIS — H25812 Combined forms of age-related cataract, left eye: Secondary | ICD-10-CM | POA: Diagnosis not present

## 2019-06-23 LAB — HM DIABETES EYE EXAM

## 2019-06-30 ENCOUNTER — Ambulatory Visit: Payer: Medicare HMO | Admitting: Family Medicine

## 2019-07-08 ENCOUNTER — Encounter: Payer: Self-pay | Admitting: Family Medicine

## 2019-07-08 ENCOUNTER — Other Ambulatory Visit: Payer: Self-pay

## 2019-07-08 ENCOUNTER — Ambulatory Visit (INDEPENDENT_AMBULATORY_CARE_PROVIDER_SITE_OTHER): Payer: Medicare HMO | Admitting: Family Medicine

## 2019-07-08 VITALS — BP 118/54 | HR 72 | Temp 97.7°F | Wt 227.5 lb

## 2019-07-08 DIAGNOSIS — E1159 Type 2 diabetes mellitus with other circulatory complications: Secondary | ICD-10-CM | POA: Diagnosis not present

## 2019-07-08 DIAGNOSIS — M170 Bilateral primary osteoarthritis of knee: Secondary | ICD-10-CM

## 2019-07-08 LAB — POCT GLYCOSYLATED HEMOGLOBIN (HGB A1C): Hemoglobin A1C: 8.1 % — AB (ref 4.0–5.6)

## 2019-07-08 MED ORDER — TRAMADOL HCL 50 MG PO TABS: 50.0000 mg | ORAL_TABLET | Freq: Two times a day (BID) | ORAL | 2 refills | Status: DC | PRN

## 2019-07-08 NOTE — Progress Notes (Signed)
This visit occurred during the SARS-CoV-2 public health emergency.  Safety protocols were in place, including screening questions prior to the visit, additional usage of staff PPE, and extensive cleaning of exam room while observing appropriate contact time as indicated for disinfecting solutions.  He had covid vaccine, d/w pt.    Diabetes:  Using medications without difficulties: yes, still on glimepiride with basal/SSI at baseline.   Hypoglycemic episodes: no Hyperglycemic episodes: no Feet problems: no Blood Sugars averaging: rarely >200, lowest 130s.   eye exam within last year: recently done, 06/23/19 (Dr. Katy Fitch) A1c done at OV. 8.1.  He isn't lightheaded.    Taking tramadol prn for knee pain.  It helps.  No ADE on med.    Meds, vitals, and allergies reviewed.   ROS: Per HPI unless specifically indicated in ROS section   GEN: nad, alert and oriented HEENT: ncat NECK: supple w/o LA CV: rrr. PULM: ctab, no inc wob ABD: soft, +bs EXT: no edema SKIN: no acute rash L knee with crepitus on ROM  Diabetic foot exam: Normal inspection No skin breakdown No calluses  Normal DP pulses Normal sensation to light touch and monofilament Nails thickened.

## 2019-07-08 NOTE — Patient Instructions (Signed)
Don't change your meds for now.   Recheck in about 6 months at a yearly physical with labs ahead of time.  Update me as needed.  Take care.  Glad to see you.

## 2019-07-11 NOTE — Assessment & Plan Note (Signed)
Continue as needed tramadol and update me as needed.  To help with his knee pain.  No adverse effect on medication.  He agrees.

## 2019-07-11 NOTE — Assessment & Plan Note (Signed)
A1c is reasonable for now.  I do not want to induce hypoglycemia.  Continue as is.  Recheck in about 6 months.  Update me as needed in the meantime.  He agrees with plan.  See after visit summary.

## 2019-07-12 ENCOUNTER — Encounter: Payer: Self-pay | Admitting: Family Medicine

## 2019-08-22 ENCOUNTER — Other Ambulatory Visit: Payer: Self-pay | Admitting: Family Medicine

## 2019-10-19 ENCOUNTER — Other Ambulatory Visit: Payer: Self-pay | Admitting: Family Medicine

## 2019-10-20 NOTE — Telephone Encounter (Signed)
°  Electronic refill request. [name] Last office visit:   07/08/2019 Last Filled:    4.5 mL 2 10/29/2018   Rx requires mL amount, please help

## 2019-10-21 NOTE — Telephone Encounter (Signed)
Sent. Thanks.   

## 2019-12-16 ENCOUNTER — Telehealth: Payer: Self-pay | Admitting: Family Medicine

## 2019-12-16 NOTE — Chronic Care Management (AMB) (Signed)
  Chronic Care Management   Note  12/16/2019 Name: [name] MRN: [id number] DOB: Jan 17, 1935  [name] is a 85 y.o. year old male who is a primary care patient of Tonia Ghent, MD. I reached out to [name] by phone today in response to a referral sent by [name]'s PCP, Tonia Ghent, MD.   [name] was given information about Chronic Care Management services today including:  1. CCM service includes personalized support from designated clinical staff supervised by his physician, including individualized plan of care and coordination with other care providers 2. 24/7 contact phone numbers for assistance for urgent and routine care needs. 3. Service will only be billed when office clinical staff spend 20 minutes or more in a month to coordinate care. 4. Only one practitioner may furnish and bill the service in a calendar month. 5. The patient may stop CCM services at any time (effective at the end of the month) by phone call to the office staff.   Patient agreed to services and verbal consent obtained.   Follow up plan:   [place]

## 2019-12-18 ENCOUNTER — Other Ambulatory Visit: Payer: Self-pay | Admitting: Family Medicine

## 2019-12-18 DIAGNOSIS — E1159 Type 2 diabetes mellitus with other circulatory complications: Secondary | ICD-10-CM

## 2019-12-18 DIAGNOSIS — E538 Deficiency of other specified B group vitamins: Secondary | ICD-10-CM

## 2019-12-26 ENCOUNTER — Other Ambulatory Visit: Payer: Self-pay

## 2019-12-26 ENCOUNTER — Other Ambulatory Visit (INDEPENDENT_AMBULATORY_CARE_PROVIDER_SITE_OTHER): Payer: Medicare HMO

## 2019-12-26 DIAGNOSIS — E538 Deficiency of other specified B group vitamins: Secondary | ICD-10-CM | POA: Diagnosis not present

## 2019-12-26 DIAGNOSIS — E1159 Type 2 diabetes mellitus with other circulatory complications: Secondary | ICD-10-CM | POA: Diagnosis not present

## 2019-12-26 LAB — COMPREHENSIVE METABOLIC PANEL
ALT: 20 U/L (ref 0–53)
AST: 15 U/L (ref 0–37)
Albumin: 4.2 g/dL (ref 3.5–5.2)
Alkaline Phosphatase: 65 U/L (ref 39–117)
BUN: 18 mg/dL (ref 6–23)
CO2: 30 mEq/L (ref 19–32)
Calcium: 9.7 mg/dL (ref 8.4–10.5)
Chloride: 101 mEq/L (ref 96–112)
Creatinine, Ser: 1.16 mg/dL (ref 0.40–1.50)
GFR: 58.45 mL/min — ABNORMAL LOW (ref >60.00)
Glucose, Bld: 220 mg/dL — ABNORMAL HIGH (ref 70–99)
Potassium: 4.1 mEq/L (ref 3.5–5.1)
Sodium: 138 mEq/L (ref 135–145)
Total Bilirubin: 0.5 mg/dL (ref 0.2–1.2)
Total Protein: 6.7 g/dL (ref 6.0–8.3)

## 2019-12-26 LAB — LIPID PANEL
Cholesterol: 153 mg/dL (ref 0–200)
HDL: 32.8 mg/dL — ABNORMAL LOW (ref >39.00)
LDL Cholesterol: 86 mg/dL (ref 0–99)
NonHDL: 119.77
Total CHOL/HDL Ratio: 5
Triglycerides: 170 mg/dL — ABNORMAL HIGH (ref 0.0–149.0)
VLDL: 34 mg/dL (ref 0.0–40.0)

## 2019-12-26 LAB — TSH: TSH: 3.74 u[IU]/mL (ref 0.35–4.50)

## 2019-12-26 LAB — HEMOGLOBIN A1C: Hgb A1c MFr Bld: 8.1 % — ABNORMAL HIGH (ref 4.6–6.5)

## 2019-12-26 LAB — VITAMIN B12: Vitamin B-12: 1065 pg/mL — ABNORMAL HIGH (ref 211–911)

## 2019-12-27 ENCOUNTER — Other Ambulatory Visit: Payer: Medicare HMO

## 2020-01-03 ENCOUNTER — Encounter: Payer: Self-pay | Admitting: Family Medicine

## 2020-01-03 ENCOUNTER — Ambulatory Visit (INDEPENDENT_AMBULATORY_CARE_PROVIDER_SITE_OTHER): Payer: Medicare HMO | Admitting: Family Medicine

## 2020-01-03 ENCOUNTER — Other Ambulatory Visit: Payer: Self-pay

## 2020-01-03 VITALS — BP 138/72 | HR 85 | Temp 98.1°F | Ht 72.0 in | Wt 233.9 lb

## 2020-01-03 DIAGNOSIS — Z7189 Other specified counseling: Secondary | ICD-10-CM

## 2020-01-03 DIAGNOSIS — I10 Essential (primary) hypertension: Secondary | ICD-10-CM | POA: Diagnosis not present

## 2020-01-03 DIAGNOSIS — E538 Deficiency of other specified B group vitamins: Secondary | ICD-10-CM

## 2020-01-03 DIAGNOSIS — Z23 Encounter for immunization: Secondary | ICD-10-CM | POA: Diagnosis not present

## 2020-01-03 DIAGNOSIS — Z0000 Encounter for general adult medical examination without abnormal findings: Secondary | ICD-10-CM

## 2020-01-03 DIAGNOSIS — E1159 Type 2 diabetes mellitus with other circulatory complications: Secondary | ICD-10-CM | POA: Diagnosis not present

## 2020-01-03 NOTE — Patient Instructions (Signed)
Recheck in about 6 months.  Don't change your meds for now. We can do labs at the visit.  You don't have to fast.  Update me as needed.   If you need refills, then have the pharmacy call us or contact us directly (if changing pharmacy).  Take care.  Glad to see you.

## 2020-01-03 NOTE — Progress Notes (Signed)
This visit occurred during the SARS-CoV-2 public health emergency.  Safety protocols were in place, including screening questions prior to the visit, additional usage of staff PPE, and extensive cleaning of exam room while observing appropriate contact time as indicated for disinfecting solutions.  Diabetes:  Using medications without difficulties: yes Hypoglycemic episodes: no Hyperglycemic episodes: no Feet problems: no Blood Sugars averaging: sugar 156 this Am.   eye exam within last year: yes A1c 8.1   Hypertension:    Using medication without problems or lightheadedness: yes Chest pain with exertion: no Edema:no Short of breath: not unless he had been working in the yard in dusty conditions w/o a mask.  Routine cautions given to patient.  B12 def.  Monthly shot at home, no ADE on med. Compliant.     Flu vaccine- 2021 covid vaccine 2021 Shingles d/w pt. defer given recent booster.   PNA and Tetanus up to date.  PSA and colon CA screening declined due to age.  Living will d/w pt. Would have his sister Darliss Ridgel and his son all equally designated if he were incapacitated.  Lives at home.  Still independent.  No falls.  D/w pt about routine safety.    PMH and SH reviewed  Meds, vitals, and allergies reviewed.   ROS: Per HPI unless specifically indicated in ROS section   GEN: nad, alert and oriented HEENT: NCAT NECK: supple w/o LA CV: rrr. PULM: ctab, no inc wob ABD: soft, +bs EXT: no edema SKIN: well perfused No tremor. Speech normal.

## 2020-01-04 NOTE — Assessment & Plan Note (Addendum)
Labs discussed with patient.  Continue amlodipine carvedilol, lisinopril hydrochlorothiazide and hydralazine.  He agrees with plan.  Recheck blood pressure controlled.

## 2020-01-04 NOTE — Assessment & Plan Note (Signed)
A1c reasonable at 8.1.  I do not want to induce hypoglycemia.  Continue insulin, glimepiride, and recheck periodically.  He agrees.

## 2020-01-04 NOTE — Assessment & Plan Note (Signed)
Flu vaccine- 2021 covid vaccine 2021 Shingles d/w pt. defer given recent booster.   PNA and Tetanus up to date.  PSA and colon CA screening declined due to age.  Living will d/w pt. Would have his sister [name] and his son all equally designated if he were incapacitated.  Lives at home.  Still independent.  No falls.  D/w pt about routine safety.

## 2020-01-04 NOTE — Assessment & Plan Note (Signed)
B12 def.  Monthly shot at home, no ADE on med. Compliant.    Would continue as is.  He agrees.

## 2020-01-04 NOTE — Assessment & Plan Note (Signed)
Living will d/w pt. Would have his sister Darliss Ridgel and his son all equally designated if he were incapacitated.

## 2020-01-16 ENCOUNTER — Telehealth: Payer: Self-pay

## 2020-01-16 NOTE — Chronic Care Management (AMB) (Signed)
Chronic Care Management Pharmacy Assistant   Name: [name]  MRN: [id number] DOB: 01/28/1935  Reason for Encounter: Medication Review    PCP : Tonia Ghent, MD  Allergies:   Allergies  Allergen Reactions   Codeine Phosphate Nausea Only and Other (See Comments)    Can take with food and usually doesn't cause nausea   Morphine Nausea Only and Other (See Comments)    Can take with food and usually doesn't cause nausea   Metformin And Related Diarrhea    Medications: Outpatient Encounter Medications as of 01/16/2020  Medication Sig   Accu-Chek Softclix Lancets lancets Check sugar up to 3 times daily. E11.59  Insulin dependent   amLODipine (NORVASC) 10 MG tablet TAKE 1 TABLET EVERY MORNING   aspirin EC 81 MG tablet Take 81 mg by mouth daily.    Blood Glucose Calibration (ACCU-CHEK AVIVA) SOLN Use as directed monthly. E11.59  Insulin dependent   Blood Glucose Monitoring Suppl (ACCU-CHEK AVIVA PLUS) w/Device KIT Check sugar up to 3 times daily. E11.59  Insulin dependent   carvedilol (COREG) 12.5 MG tablet TAKE 1 TABLET TWICE DAILY WITH MEALS   COD LIVER OIL PO Take 1 capsule by mouth 2 (two) times daily.    cyanocobalamin (,VITAMIN B-12,) 1000 MCG/ML injection Inject 1 mL (1,000 mcg total) into the muscle every 30 (thirty) days. Dispense 3 of the 1 mL vials.   diclofenac sodium (VOLTAREN) 1 % GEL Apply 2 g topically 4 (four) times daily. On the knuckles as needed.   DROPLET PEN NEEDLES 30G X 8 MM MISC USE  WITH  INSULIN  PEN   glimepiride (AMARYL) 4 MG tablet TAKE 1/2 TABLET EVERY DAY  WITH  BREAKFAST   glucose blood (ACCU-CHEK AVIVA PLUS) test strip Check sugar up to 3 times daily. E11.59  Insulin dependent.   hydrALAZINE (APRESOLINE) 50 MG tablet TAKE 1 TABLET THREE TIMES DAILY   insulin lispro (HUMALOG KWIKPEN) 100 UNIT/ML KwikPen Inject 0.04-0.1 mLs (4-10 Units total) into the skin 3 (three) times daily. INJECT INTO SKIN 3 TIMES DAILY BEFORE MEALS  USING SLIDING SCALE. 151 TO 200 = 4 UNITS, 201 TO 250= 6 UNITS, 251 TO 300= 8 UNITS, > 300 = 10 units   lisinopril-hydrochlorothiazide (ZESTORETIC) 20-25 MG tablet TAKE 1 TABLET EVERY MORNING   meclizine (ANTIVERT) 25 MG tablet Take 0.5-1 tablets (12.5-25 mg total) by mouth 2 (two) times daily as needed for dizziness.   oxymetazoline (AFRIN) 0.05 % nasal spray Place 1 spray into both nostrils 2 (two) times daily as needed for congestion.   potassium chloride SA (KLOR-CON) 20 MEQ tablet TAKE 1 TABLET EVERY DAY   Pseudoeph-Doxylamine-DM-APAP (NYQUIL PO) Take 10 mLs by mouth at bedtime as needed (for sleep).   SYRINGE-NEEDLE, DISP, 3 ML (B-D 3CC LUER-LOK SYR 25GX1") 25G X 1" 3 ML MISC USE TO INJECT VITAMIN B12 MONTHLY.   tamsulosin (FLOMAX) 0.4 MG CAPS capsule TAKE 1 CAPSULE EVERY DAY  AFTER  SUPPER   [name] SOLOSTAR 300 UNIT/ML Solostar Pen INJECT 12 UNITS SUBCUTANEOUSLY AT BEDTIME. DISCARD PEN 56 DAYS AFTER OPENING   traMADol (ULTRAM) 50 MG tablet Take 1 tablet (50 mg total) by mouth every 12 (twelve) hours as needed (for knee pain).   No facility-administered encounter medications on file as of 01/16/2020.    Current Diagnosis: Patient Active Problem List   Diagnosis Date Noted   Medicare annual wellness visit, subsequent 01/03/2019   Recurrent Epistaxis 03/02/2017   Vitamin B 12 deficiency 08/17/2015  Elevated TSH 10/06/2014   Unsteady gait 09/18/2014   Midline low back pain without sciatica 09/18/2014   Chronic combined systolic and diastolic congestive heart failure ([place]) 01/27/2014   History of coronary artery disease    Chest pain 12/15/2013   Healthcare maintenance 11/10/2013   Advance care planning 11/10/2013   ED (erectile dysfunction) 06/24/2013   HLD (hyperlipidemia) 03/22/2012   Type 2 diabetes mellitus with vascular disease ([place]) 03/22/2012   Thyroid cyst 03/19/2011   HYPERTROPHY PROSTATE W/UR OBST & OTH LUTS 03/15/2010   GASTRITIS 02/21/2007    Anxiety state 02/15/2007   CAD 02/15/2007   DIVERTICULOSIS OF COLON 02/15/2007   COLONIC POLYPS 02/12/2007   Essential hypertension 02/12/2007   RENAL CALCULUS 02/12/2007   Osteoarthritis 02/12/2007   Have you seen any other providers since your last visit? No.  Last office visit with Dr. Damita Dunnings 01/03/20 regarding DM. No changes   Any changes in your medications or health? No  Any side effects from any medications? No  Do you have an symptoms or problems not managed by your medications? No  Any concerns about your health right now? No  Has your provider asked that you check blood pressure, blood sugar, or follow special diet at home? States "no he is not supposed to" but did check it this morning. It was 156.   Do you get any type of exercise on a regular basis? States he is active around house. Has been getting leaves up around the house.   Can you think of a goal you would like to reach for your health? "No"  Do you have any problems getting your medications? "No"  Is there anything that you would like to discuss during the appointment? "No"  Patient instructed to have medications and supplements available to review for his telephone visit with Sharyn Lull.   Follow-Up:  Pharmacist Review   Debbora Dus, CPP notified  Margaretmary Dys, [place] Pharmacy Assistant [phone]

## 2020-01-20 ENCOUNTER — Other Ambulatory Visit: Payer: Self-pay | Admitting: Family Medicine

## 2020-01-24 ENCOUNTER — Ambulatory Visit: Payer: Self-pay

## 2020-01-24 ENCOUNTER — Telehealth: Payer: Medicare HMO

## 2020-01-24 NOTE — Chronic Care Management (AMB) (Deleted)
Chronic Care Management Pharmacy  Name: [name]  MRN: [id number] DOB: 1935/01/30  Chief Complaint/ HPI  [name],  85 y.o. , male presents for their Initial CCM visit with the clinical pharmacist via telephone due to COVID-19 Pandemic. Initial questions by Ria Comment on 01/16/20 reviewed, pt did not have any medication concerns.   PCP : Tonia Ghent, MD  Their chronic conditions include: CHF, HTN, CAD, DM, diverticulosis, osteoarthritis, BPH, anxiety, HLD, ED, B12 deficiency   Office Visits:  01/03/20: PCP/[facility] DM - A1c 8.1%, BG this morning 156, reasonable control to prevent hypoglycemia, continue insulin, glimepiride; HTN, Continue amlodipine carvedilol, lisinopril hydrochlorothiazide and hydralazine.  He agrees with plan.  Recheck blood pressure controlled. B12 deficiency, on monthly injection at home  Consult Visit: none in past 6 months  Medications: Outpatient Encounter Medications as of 01/24/2020  Medication Sig  . Accu-Chek Softclix Lancets lancets TEST BLOOD SUGAR UP TO THREE TIMES DAILY  . amLODipine (NORVASC) 10 MG tablet TAKE 1 TABLET EVERY MORNING  . aspirin EC 81 MG tablet Take 81 mg by mouth daily.   . Blood Glucose Calibration (ACCU-CHEK AVIVA) SOLN Use as directed monthly. E11.59  Insulin dependent  . Blood Glucose Monitoring Suppl (ACCU-CHEK AVIVA PLUS) w/Device KIT Check sugar up to 3 times daily. E11.59  Insulin dependent  . carvedilol (COREG) 12.5 MG tablet TAKE 1 TABLET TWICE DAILY WITH MEALS  . COD LIVER OIL PO Take 1 capsule by mouth 2 (two) times daily.   . cyanocobalamin (,VITAMIN B-12,) 1000 MCG/ML injection Inject 1 mL (1,000 mcg total) into the muscle every 30 (thirty) days. Dispense 3 of the 1 mL vials.  . diclofenac sodium (VOLTAREN) 1 % GEL Apply 2 g topically 4 (four) times daily. On the knuckles as needed.  . DROPLET PEN NEEDLES 30G X 8 MM MISC USE  WITH  INSULIN  PEN  . glimepiride (AMARYL) 4 MG tablet TAKE 1/2 TABLET EVERY DAY   WITH  BREAKFAST  . glucose blood (ACCU-CHEK AVIVA PLUS) test strip Check sugar up to 3 times daily. E11.59  Insulin dependent.  . hydrALAZINE (APRESOLINE) 50 MG tablet TAKE 1 TABLET THREE TIMES DAILY  . insulin lispro (HUMALOG KWIKPEN) 100 UNIT/ML KwikPen Inject 0.04-0.1 mLs (4-10 Units total) into the skin 3 (three) times daily. INJECT INTO SKIN 3 TIMES DAILY BEFORE MEALS USING SLIDING SCALE. 151 TO 200 = 4 UNITS, 201 TO 250= 6 UNITS, 251 TO 300= 8 UNITS, > 300 = 10 units  . lisinopril-hydrochlorothiazide (ZESTORETIC) 20-25 MG tablet TAKE 1 TABLET EVERY MORNING  . meclizine (ANTIVERT) 25 MG tablet Take 0.5-1 tablets (12.5-25 mg total) by mouth 2 (two) times daily as needed for dizziness.  Marland Kitchen oxymetazoline (AFRIN) 0.05 % nasal spray Place 1 spray into both nostrils 2 (two) times daily as needed for congestion.  . potassium chloride SA (KLOR-CON) 20 MEQ tablet TAKE 1 TABLET EVERY DAY  . Pseudoeph-Doxylamine-DM-APAP (NYQUIL PO) Take 10 mLs by mouth at bedtime as needed (for sleep).  . SYRINGE-NEEDLE, DISP, 3 ML (B-D 3CC LUER-LOK SYR 25GX1") 25G X 1" 3 ML MISC USE TO INJECT VITAMIN B12 MONTHLY.  . tamsulosin (FLOMAX) 0.4 MG CAPS capsule TAKE 1 CAPSULE EVERY DAY  AFTER  SUPPER  . [name] SOLOSTAR 300 UNIT/ML Solostar Pen INJECT 12 UNITS SUBCUTANEOUSLY AT BEDTIME. DISCARD PEN 56 DAYS AFTER OPENING  . traMADol (ULTRAM) 50 MG tablet Take 1 tablet (50 mg total) by mouth every 12 (twelve) hours as needed (for knee pain).  No facility-administered encounter medications on file as of 01/24/2020.   Current Diagnosis/Assessment:  Goals Addressed   None    Hypertension   CMP Latest Ref Rng & Units 12/26/2019 12/27/2018 03/16/2018  Glucose 70 - 99 mg/dL 220(H) 231(H) 186(H)  BUN 6 - 23 mg/dL 18 18 22   Creatinine 0.40 - 1.50 mg/dL 1.16 1.18 1.17  Sodium 135 - 145 mEq/L 138 140 137  Potassium 3.5 - 5.1 mEq/L 4.1 4.0 3.5  Chloride 96 - 112 mEq/L 101 101 103  CO2 19 - 32 mEq/L 30 31 28   Calcium 8.4 - 10.5  mg/dL 9.7 9.6 9.2  Total Protein 6.0 - 8.3 g/dL 6.7 6.5 -  Total Bilirubin 0.2 - 1.2 mg/dL 0.5 0.6 -  Alkaline Phos 39 - 117 U/L 65 68 -  AST 0 - 37 U/L 15 12 -  ALT 0 - 53 U/L 20 15 -   Office blood pressures are: BP Readings from Last 3 Encounters:  01/03/20 138/72  07/08/19 (!) 118/54  12/31/18 (!) 118/58   BP today is:  {CHL HP UPSTREAM Pharmacist BP ranges:[phone]}  Patient has failed these meds in the past:  Patient checks BP at home {CHL HP BP Monitoring Frequency:([phone]} Patient home BP readings are ranging:   Patient is currently {CHL Controlled/Uncontrolled:[phone]} on the following medications:   Amlodipine 10 mg - daily  Carvedilol 12.5 mg - BID  Lisinopril- Hydrochlorothiazide 20-25 mg - daily  Hydralazine 50 mg - TID  Potassium chloride 20 mEq - 1 tablet daily  We discussed:  {CHL HP Upstream Pharmacy discussion:[phone]}  Plan: Continue {CHL HP Upstream Pharmacy [phone]}  Hyperlipidemia/CAD   LDL goal < 70 (CAD)  Last lipids Lab Results  Component Value Date   CHOL 153 12/26/2019   HDL 32.80 (L) 12/26/2019   LDLCALC 86 12/26/2019   LDLDIRECT 93.0 12/27/2018   TRIG 170.0 (H) 12/26/2019   CHOLHDL 5 12/26/2019   Hepatic Function Latest Ref Rng & Units 12/26/2019 12/27/2018 12/25/2017  Total Protein 6.0 - 8.3 g/dL 6.7 6.5 7.1  Albumin 3.5 - 5.2 g/dL 4.2 4.2 4.2  AST 0 - 37 U/L 15 12 12   ALT 0 - 53 U/L 20 15 17   Alk Phosphatase 39 - 117 U/L 65 68 74  Total Bilirubin 0.2 - 1.2 mg/dL 0.5 0.6 0.5  Bilirubin, Direct 0.0 - 0.3 mg/dL - - -    The ASCVD Risk score Mikey Bussing DC Jr., et al., 2013) failed to calculate for the following reasons:   The 2013 ASCVD risk score is only valid for ages 39 to 61   Patient has failed these meds in past: *** Patient is currently {CHL Controlled/Uncontrolled:[phone]} on the following medications:  . No statin due to concern for high risk of myopathy with motor impairment (per PCP 2014) . Aspirin  81 mg - 1 tablet daily  We discussed:  {CHL HP Upstream Pharmacy discussion:[phone]}  Plan: Continue {CHL HP Upstream Pharmacy [phone]}  Diabetes   Recent Relevant Labs: Lab Results  Component Value Date/Time   HGBA1C 8.1 (H) 12/26/2019 08:53 AM   HGBA1C 8.1 (A) 07/08/2019 09:51 AM   HGBA1C 8.3 (H) 12/27/2018 11:13 AM    Checking BG: {CHL HP Blood Glucose Monitoring Frequency:[phone]}  Recent FBG Readings: Recent pre-meal BG readings: *** Recent 2hr PP BG readings:  *** Recent HS BG readings: ***  Patient has failed these meds in past: *** Patient is currently {CHL Controlled/Uncontrolled:[phone]} on the following medications:   Humalog (insulin lispro) 100 unit/mL -  Inject 4-10 units daily TID before meals with sliding scale   151 TO 200 = 4 UNITS, 201 TO 250= 6 UNITS, 251 TO 300= 8 UNITS, > 300 = 10 units  Glimepiride 4 mg - 1/2 tablet daily with breakfast  [name] - Inject 12 units at bedtime  Lab Results  Component Value Date/Time   HMDIABEYEEXA No Retinopathy 06/23/2019 12:00 AM   We discussed: {CHL HP Upstream Pharmacy discussion:9541728959}  Plan: Continue {CHL HP Upstream Pharmacy Plans:7058166140}   Heart Failure   Type: {type of heart failure:30421350}  Last ejection fraction: *** NYHA Class: {CHL HP Upstream Pharm NYHA Class:5041514638} AHA HF Stage: {CHL HP Upstream Pharm AHA HF Stage:[phone]}  Patient has failed these meds in past: *** Patient is currently {CHL Controlled/Uncontrolled:[phone]} on the following medications: ***  We discussed {CHL HP Upstream Pharmacy discussion:9541728959}  Plan  Continue {CHL HP Upstream Pharmacy Plans:7058166140}   BPH   PSA  Date Value Ref Range Status  01/31/2014 2.75 <=4.00 ng/mL Final    Comment:    (NOTE) Test Methodology: ECLIA PSA (Electrochemiluminescence Immunoassay) For PSA values from 2.5-4.0, particularly in younger men <70 years old, the AUA and NCCN suggest testing  for % Free PSA (3515) and evaluation of the rate of increase in PSA (PSA velocity). Performed at [facility]   03/22/2012 2.61 0.10 - 4.00 ng/mL Final  03/19/2011 2.57 0.10 - 4.00 ng/mL Final     Patient has failed these meds in past: *** Patient is currently {CHL Controlled/Uncontrolled:[phone]} on the following medications:  . Tamsulosin 0.4 mg - 1 cap daily  We discussed:  ***  Plan  Continue {CHL HP Upstream Pharmacy Plans:7058166140}   B12 Deficiency    Patient has failed these meds in past:  Patient is currently {CHL Controlled/Uncontrolled:[phone]} on the following medications:   B12 1000 mcg/mL - 1 mL every month  We discussed:   Plan: Continue {CHL HP Upstream Pharmacy NHAFB:9038333832}   Medication Management   OTCs: Patient's preferred pharmacy is: [facility] Uses pill box? {Yes or If no, why not?:20788} Pt endorses ***% compliance  We discussed: Current pharmacy is preferred with insurance plan and patient is satisfied with pharmacy services  Plan  {US Pharmacy NVBT:66060}    Follow up: *** month phone visit  Debbora Dus, PharmD Clinical Pharmacist [facility] at [facility] [phone]

## 2020-01-24 NOTE — Chronic Care Management (AMB) (Signed)
°  Chronic Care Management   Outreach Note  01/24/2020 Name: [name] MRN: [id number] DOB: 03-Oct-1934  PCP: Tonia Ghent, MD Reason for referral : Chronic Care Management  Attempted to reach patient by telephone on 01/24/20 at 2 PM for initial CCM visit. Left voicemail with contact information for rescheduling.  Debbora Dus, PharmD Clinical Pharmacist [facility] at [facility] [phone]

## 2020-02-11 DIAGNOSIS — Z85820 Personal history of malignant melanoma of skin: Secondary | ICD-10-CM | POA: Insufficient documentation

## 2020-02-11 DIAGNOSIS — C439 Malignant melanoma of skin, unspecified: Secondary | ICD-10-CM

## 2020-02-11 HISTORY — DX: Malignant melanoma of skin, unspecified: C43.9

## 2020-02-13 ENCOUNTER — Other Ambulatory Visit: Payer: Self-pay | Admitting: Family Medicine

## 2020-02-13 NOTE — Telephone Encounter (Signed)
Please Advise

## 2020-02-14 NOTE — Telephone Encounter (Signed)
Sent. Thanks.   

## 2020-02-22 ENCOUNTER — Other Ambulatory Visit: Payer: Self-pay | Admitting: General Surgery

## 2020-02-22 DIAGNOSIS — L570 Actinic keratosis: Secondary | ICD-10-CM | POA: Diagnosis not present

## 2020-02-22 DIAGNOSIS — L905 Scar conditions and fibrosis of skin: Secondary | ICD-10-CM | POA: Diagnosis not present

## 2020-02-22 DIAGNOSIS — L819 Disorder of pigmentation, unspecified: Secondary | ICD-10-CM | POA: Diagnosis not present

## 2020-02-22 DIAGNOSIS — D485 Neoplasm of uncertain behavior of skin: Secondary | ICD-10-CM | POA: Diagnosis not present

## 2020-02-22 DIAGNOSIS — Z85820 Personal history of malignant melanoma of skin: Secondary | ICD-10-CM | POA: Diagnosis not present

## 2020-02-22 DIAGNOSIS — L821 Other seborrheic keratosis: Secondary | ICD-10-CM | POA: Diagnosis not present

## 2020-02-22 DIAGNOSIS — L989 Disorder of the skin and subcutaneous tissue, unspecified: Secondary | ICD-10-CM | POA: Diagnosis not present

## 2020-02-22 DIAGNOSIS — Z85828 Personal history of other malignant neoplasm of skin: Secondary | ICD-10-CM | POA: Diagnosis not present

## 2020-02-22 DIAGNOSIS — L814 Other melanin hyperpigmentation: Secondary | ICD-10-CM | POA: Diagnosis not present

## 2020-02-22 DIAGNOSIS — D0362 Melanoma in situ of left upper limb, including shoulder: Secondary | ICD-10-CM | POA: Diagnosis not present

## 2020-02-22 DIAGNOSIS — D229 Melanocytic nevi, unspecified: Secondary | ICD-10-CM | POA: Diagnosis not present

## 2020-02-22 DIAGNOSIS — L718 Other rosacea: Secondary | ICD-10-CM | POA: Diagnosis not present

## 2020-03-06 ENCOUNTER — Telehealth: Payer: Self-pay

## 2020-03-06 NOTE — Telephone Encounter (Signed)
MICHAEL (DPR signed) left v/m that Dr Damita Dunnings will receive request for rx from [facility]. I do not see request yet. I spoke with Legrand Como and he said pt has changed ins co to [facility] and [facility] will contact [facility] for refills on all of pts meds and diabetic test strips. Michael request cb when done. Sending note to [facility].

## 2020-03-07 ENCOUNTER — Other Ambulatory Visit: Payer: Self-pay

## 2020-03-07 MED ORDER — AMLODIPINE BESYLATE 10 MG PO TABS
10.0000 mg | ORAL_TABLET | Freq: Every morning | ORAL | 3 refills | Status: DC
Start: 2020-03-07 — End: 2020-04-13

## 2020-03-07 MED ORDER — POTASSIUM CHLORIDE CRYS ER 20 MEQ PO TBCR: 20.0000 meq | EXTENDED_RELEASE_TABLET | Freq: Every day | ORAL | 3 refills | Status: DC

## 2020-03-07 MED ORDER — LISINOPRIL-HYDROCHLOROTHIAZIDE 20-25 MG PO TABS: 1.0000 | ORAL_TABLET | Freq: Every morning | ORAL | 3 refills | Status: DC

## 2020-03-07 MED ORDER — CARVEDILOL 12.5 MG PO TABS: ORAL_TABLET | ORAL | 3 refills | Status: DC

## 2020-03-07 MED ORDER — TAMSULOSIN HCL 0.4 MG PO CAPS
ORAL_CAPSULE | ORAL | 2 refills | Status: DC
Start: 2020-03-07 — End: 2020-04-13

## 2020-03-07 MED ORDER — GLIMEPIRIDE 4 MG PO TABS: ORAL_TABLET | ORAL | 2 refills | Status: DC

## 2020-03-07 MED ORDER — HYDRALAZINE HCL 50 MG PO TABS
50.0000 mg | ORAL_TABLET | Freq: Three times a day (TID) | ORAL | 3 refills | Status: DC
Start: 2020-03-07 — End: 2020-12-20

## 2020-03-07 NOTE — Telephone Encounter (Signed)
Received notification from [facility] for new rxs to be sent to them. I have sent everything that was requested and notified Legrand Como per [facility] that this was done.

## 2020-03-09 ENCOUNTER — Encounter: Payer: Self-pay | Admitting: Family Medicine

## 2020-03-09 DIAGNOSIS — D0359 Melanoma in situ of other part of trunk: Secondary | ICD-10-CM | POA: Diagnosis not present

## 2020-03-09 DIAGNOSIS — L905 Scar conditions and fibrosis of skin: Secondary | ICD-10-CM | POA: Diagnosis not present

## 2020-03-09 MED ORDER — POTASSIUM CHLORIDE CRYS ER 20 MEQ PO TBCR: 20.0000 meq | EXTENDED_RELEASE_TABLET | Freq: Every day | ORAL | 3 refills | Status: DC

## 2020-03-09 NOTE — Telephone Encounter (Signed)
Received fax from [facility] regarding patients Potassium Chloride prescription. Fax states, "Klor-con is no longer available in a 20 meg formulation. It is available as generic KLOR-CON M TAB 20 MEQ." Ok to change?

## 2020-03-09 NOTE — Telephone Encounter (Signed)
Please have them make the change.  Thanks.

## 2020-03-09 NOTE — Telephone Encounter (Signed)
Sent in new script electronically

## 2020-03-12 ENCOUNTER — Encounter: Payer: Self-pay | Admitting: Family Medicine

## 2020-03-12 ENCOUNTER — Other Ambulatory Visit: Payer: Self-pay | Admitting: General Surgery

## 2020-03-12 DIAGNOSIS — C4362 Malignant melanoma of left upper limb, including shoulder: Secondary | ICD-10-CM | POA: Diagnosis not present

## 2020-03-21 ENCOUNTER — Other Ambulatory Visit: Payer: Self-pay | Admitting: Family Medicine

## 2020-03-22 ENCOUNTER — Telehealth: Payer: Self-pay

## 2020-03-22 ENCOUNTER — Other Ambulatory Visit: Payer: Self-pay

## 2020-03-22 MED ORDER — ONETOUCH ULTRA VI STRP: ORAL_STRIP | 0 refills | Status: DC

## 2020-03-22 NOTE — Telephone Encounter (Signed)
Noted. Thanks.

## 2020-03-22 NOTE — Telephone Encounter (Signed)
[facility] Night - Client TELEPHONE ADVICE RECORD [facility] Patient Name: [name] Gender: Male DOB: 02-09-1935 Age: 86 Y 2 M 29 D Return Phone Number: [phone] (Primary), [phone] (Secondary) Address[place] [address] Client [place] Night - Client Client Site [place] Physician Renford Dills - MD Contact Type Call Who Is Calling Patient / Member / Family / Caregiver Call Type Triage / Clinical Caller Name Robt Okuda ' Relationship To Patient Son Return Phone Number [phone] (Primary) Chief Complaint Prescription Refill or Medication Request (non symptomatic) Reason for Call Medication Question / Request Initial Comment caller states his father has [facility] for his test strips and he only has 2 left and he's needing an emergency script them today / the pharmacy will not let him purchase without a script until mail order arrives ( one touch ultra strips) Translation No Nurse Assessment Nurse: Wiser, RN, Heidi Date/Time (Eastern Time): 03/21/2020 7:33:39 PM Confirm and document reason for call. If symptomatic, describe symptoms. ---Caller states his father needs a Rx for One touch Ultra test strips sent [facility] In [place]. Request is urgent as mail order pharmacy did not send strips in a timely fashion. Caller states patient has no new or worsening symptoms. Does the patient have any new or worsening symptoms? ---No Disp. Time Eilene Ghazi Time) Disposition Final User 03/21/2020 6:37:55 PM Send To RN Personal Merry Lofty, RN, Lattie Haw 03/21/2020 6:38:16 PM Send To RN Personal Merry Lofty, RN, Lattie Haw 03/21/2020 7:15:10 PM Attempt made - message left Wiser, RN, Ishmael Holter 03/21/2020 7:37:44 PM Clinical Call Yes Wiser, RN, Ishmael Holter

## 2020-03-22 NOTE — Telephone Encounter (Signed)
Strips have been ordered and sent to [facility] drug as requested.

## 2020-03-26 ENCOUNTER — Other Ambulatory Visit (HOSPITAL_COMMUNITY)
Admission: RE | Admit: 2020-03-26 | Discharge: 2020-03-26 | Disposition: A | Discharge status: Home / Self Care | Payer: Medicare Other | Source: Ambulatory Visit | Attending: General Surgery | Admitting: General Surgery

## 2020-03-26 DIAGNOSIS — Z01812 Encounter for preprocedural laboratory examination: Secondary | ICD-10-CM | POA: Insufficient documentation

## 2020-03-26 DIAGNOSIS — Z20822 Contact with and (suspected) exposure to covid-19: Secondary | ICD-10-CM | POA: Diagnosis not present

## 2020-03-26 LAB — SARS CORONAVIRUS 2 (TAT 6-24 HRS): SARS Coronavirus 2: NEGATIVE

## 2020-03-27 ENCOUNTER — Other Ambulatory Visit: Payer: Self-pay | Admitting: Family Medicine

## 2020-03-28 ENCOUNTER — Encounter (HOSPITAL_COMMUNITY): Payer: Self-pay | Admitting: General Surgery

## 2020-03-28 NOTE — Anesthesia Preprocedure Evaluation (Addendum)
Anesthesia Evaluation  Patient identified by MRN, date of birth, ID band Patient awake    Reviewed: Allergy & Precautions, H&P , NPO status , Patient's Chart, lab work & pertinent test results  Airway Mallampati: II   Neck ROM: full    Dental   Pulmonary neg pulmonary ROS,    breath sounds clear to auscultation       Cardiovascular hypertension, +CHF   Rhythm:regular Rate:Normal     Neuro/Psych PSYCHIATRIC DISORDERS Anxiety    GI/Hepatic   Endo/Other  diabetes, Type 2  Renal/GU      Musculoskeletal  (+) Arthritis ,   Abdominal   Peds  Hematology   Anesthesia Other Findings   Reproductive/Obstetrics                            Anesthesia Physical Anesthesia Plan  ASA: III  Anesthesia Plan: General   Post-op Pain Management:  Regional for Post-op pain   Induction: Intravenous  PONV Risk Score and Plan: 2 and Ondansetron, Midazolam and Treatment may vary due to age or medical condition  Airway Management Planned: LMA  Additional Equipment:   Intra-op Plan:   Post-operative Plan: Extubation in OR  Informed Consent: I have reviewed the patients History and Physical, chart, labs and discussed the procedure including the risks, benefits and alternatives for the proposed anesthesia with the patient or authorized representative who has indicated his/her understanding and acceptance.     Dental advisory given  Plan Discussed with: CRNA, Anesthesiologist and Surgeon  Anesthesia Plan Comments: (PAT note written 03/28/2020 by Myra Gianotti, PA-C. SAME DAY WORK-UP   )       Anesthesia Quick Evaluation

## 2020-03-28 NOTE — Progress Notes (Signed)
Patient denies shortness of breath, fever, cough or chest pain.  PCP - Dr [name] Cardiologist - n/a  Chest x-ray - n/a EKG - DOS 03/29/20 Stress Test - 02/19/12 ECHO - 01/27/14 Cardiac Cath - 07/27/02  Fasting Blood Sugar - 130s-180s Checks Blood Sugar 3-4 times a day  . Do not take glimepiride on the morning of surgery.  . THE NIGHT BEFORE SURGERY, take 6 units Insulin Glargine.      . If your blood sugar is less than 70 mg/dL, you will need to treat for low blood sugar: o Treat a low blood sugar (less than 70 mg/dL) with  cup of clear juice (cranberry or apple), 4 glucose tablets, OR glucose gel. o Recheck blood sugar in 15 minutes after treatment (to make sure it is greater than 70 mg/dL). If your blood sugar is not greater than 70 mg/dL on recheck, call [phone] for further instructions.  Aspirin Instructions: Follow your surgeon's instructions on when to stop aspirin prior to surgery,  If no instructions were given by your surgeon then you will need to call the office for those instructions.  ERAS: Clear liquids til 4:30 am day of surgery.  Anesthesia review: Yes  STOP now taking any Aspirin (unless otherwise instructed by your surgeon), Aleve, Naproxen, Ibuprofen, Motrin, Advil, Goody's, BC's, all herbal medications, fish oil, and all vitamins.   Coronavirus Screening Covid test on 03/26/20 was negative.  Patient verbalized understanding of instructions that were given

## 2020-03-28 NOTE — Progress Notes (Signed)
Anesthesia Chart Review: [name]   Case: [id number] Date/Time: 03/29/20 0715   Procedure: WIDE LOCAL EXCISION, ADVANCED FLAP CLOSURE LEFT ARM MELANOMA DEFECT WITH SENTINEL LYMPH NODE MAPPING AND BIOPSY (Left ) - GEN AND PECTORAL BLOCK   Anesthesia type: General   Pre-op diagnosis: LEFT ARM MELANOMA   [facility] ROOM 08 / [facility]   Surgeons: Almond Lint, MD      DISCUSSION: Patient is an 86 year old male scheduled for the above procedure.  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 He had non-obstructive CAD by 2004 cath.  Last cardiology visit 03/15/14 with Donato Schultz, MD for fall, possible recurrent syncope although patient denied. Per note, "Continue to encourage neurologic referral. EKG reassuring. Recent echocardiogram reassuring with only minimal decrease in ejection fraction. Very similar finding on nuclear stress test in 2010 which showed no ischemia. I suggested holding pravastatin in case there could possibly be a correlation with derived weakness. No further cardiac studies necessary. If he has unexplained syncope/loss of consciousness, could encourage event monitor in the future. For now, no further workup.Marland KitchenMarland KitchenWe will see him back on as-needed basis."  Last visit with PCP Joaquim Nam, MD 01/03/20. A1c stable at 8.1% on 12/26/19, no changes made in hopes to avoid hypoglycemia. HTN overall controlled on medications.  I called patient. He denied SOB, chest pain, edema, presyncope, syncope. He was going to have left TKA in  03/2018 but this never happened because his A1c was not < 8%. He says bilateral knee pain limits his activity but he is able to do daily activities, walk to his mailbox, go up his back stairs, and work on his lawnmowers without CV symptoms. He does not believe he has had an EKG within the past year. His last labs were in 12/2019. Fasting CBG was 189 this morning. He has not been instructed to hold ASA by Dr. Donell Beers. HTN medications include amlodipine, Coreg, hydralazine, lisinopril-HCTZ.  3rd Moderna Covid-19 vaccine 12/20/19. 03/26/20 COVID-19 test negative.   He is a same day work-up. Last EKG > 1 year ago. Anesthesiologist to evaluate on the day of surgery. Reviewed case with anesthesiologist Karna Christmas, MD.   VS:  BP Readings from Last 3 Encounters:  01/03/20 138/72  07/08/19 (!) 118/54  12/31/18 (!) 118/58   Pulse Readings from Last 3 Encounters:  01/03/20 85  07/08/19 72  12/31/18 70    PROVIDERS: Joaquim Nam, MD is PCP  Zannie Kehr, MD is dermatologist   LABS: He will need updated labs. Currently last lab results include: Lab Results  Component Value Date   WBC 6.7 03/16/2018   HGB 14.9 03/16/2018   HCT 46.2 03/16/2018   PLT 303 03/16/2018   GLUCOSE 220 (H) 12/26/2019   ALT 20 12/26/2019   AST 15 12/26/2019   NA 138 12/26/2019   K 4.1 12/26/2019   CL 101 12/26/2019   CREATININE 1.16 12/26/2019   BUN 18 12/26/2019   CO2 30 12/26/2019   TSH 3.74 12/26/2019   HGBA1C 8.1 (H) 12/26/2019    EKG: Currently last EKG seen is > 64 year old  and from 03/16/18: Normal sinus rhythm with sinus arrhythmia Minimal voltage criteria for LVH, may be normal variant Nonspecific T wave abnormality Abnormal ECG No significant change since last tracing Confirmed by Daneen Schick [phone]) on 03/16/2018 3:35:43 PM   CV: Echo 01/27/14: Study Conclusions - Left ventricle: The cavity size was mildly dilated. Wall thickness was increased in a pattern of mild LVH.  Systolic function was mildly reduced. The estimated ejection fraction was in the range of 45% to 50%. Doppler parameters are consistent with abnormal left ventricular relaxation (grade 1 diastolic dysfunction). - Left atrium: The atrium was mildly dilated. - Right ventricle: The cavity size was mildly dilated. - Right atrium: The atrium was mildly dilated. - Pulmonary arteries: PA peak pressure: 32 mm Hg (S). (Comparison: 04/17/08: LVEF 50-55% with mild distal anterior wall hypokinesis, false chord at LV apex, mild MR; 01/03/14 LVEF 35-40%, diffuse hypokinesis, grade 1 DD)   Nuclear stress test 02/19/12: Overall Impression:  Intermediate stress nuclear study due to LV dysfunction; small, mild, fixed apical defect consistent with thinning; no ischemia. LV Ejection Fraction: 43%.  LV Wall Motion:  Global hypokinesis. - Per 03/03/12 cardiology note by Richardson Dopp, PA-C, "stress test is unchanged from 2010. His ejection fraction was mildly reduced in 2010 as well as his most recent study. His echocardiogram in 2010 demonstrated normal LV function. At this point, it does not appear that he needs further cardiac evaluation." - Comparison nuclear stress test 04/20/08: EF 47%, mild global hypokinesis, LVH, low risk adenosine nuclear study with apical thinning versus small prior apical infarct and m ild apical ischemia; 01/27/00: No inducible ischemic, EF 49%, slight decreased activity inferiorly thought to represent diaphragmatic attenuation   Cardiac cath 07/27/02: 1. Ventriculography: Overall ejection fraction estimated at 50-55%.  There is some abnormal motion of the distal anterolateral wall, although it is not clearly akinetic.  It appears somewhat dyssynergic. 2. LM: Free of critical disease. 3. LAD: Courses to the apex and has 3 major DIAG branches.  The LAD after D3 branch has some mild systolic milking suggestive of mild myocardial __________.  The remainder of the LAD is without critical  disease.  Large D1 and D2 are free of disease. Moderate D3 with segmental plaque just after its takeoff with about 50-60%. 4. LCX: Provides 2 minor marginal branches proximally which are small and insignificant. OM3 is a fairly large vessel with probably 40-50% ostial plaquing.  Distally, the AV circumflex provides several other small minor branches and terminates as a small PDA branch with about 40-50% narrowing in its proximal portion. 5. RCA: Very large caliber vessel providing an acute marginal branch, a PDA, and a PLA branch.  There is perhaps 30%-40% plaquing at the takeoff of the PDA.  The remainder of the RCA is free of significant disease with an estimated size of between 5-6 mm in its main portion. CONCLUSIONS:  1. Preserved overall left ventricular function with a minor wall motion abnormality (abnormal septal motion noted by echocardiography in 2000).  2. 50-60% lesion of the third diagonal branch is noted.  3. Mild ostial disease involving the circumflex marginal.  4. Mild segmental plaquing at the origin of the posterior descending in the AV portion of the right coronary artery.   Past Medical History:  Diagnosis Date  . Anxiety   . Borderline hyperlipidemia   . CAD (coronary artery disease)    pt. denies at preop  . Colon polyps   . Complication of anesthesia    trouble  urinating after anesthesia  . Diverticulosis of colon   . DJD (degenerative joint disease)   . DM (diabetes mellitus) ([place])    Adult onset  type 2  . History of gastritis   . History of kidney stones   . History of pyelonephritis   . History of syncope   . HTN (hypertension)   . Hypertrophy of prostate with urinary obstruction and other lower urinary tract symptoms (LUTS)   . Melanoma ([place])    back of neck  . Melanoma ([place]) 2022   per dermatology  . Myocardial infarction ([place])    tx'd medically  pt. denies at preop  . Myoclonus     Past Surgical History:  Procedure Laterality Date  . APPENDECTOMY     . cartarized  nose blood vessel     left side  . CATARACT EXTRACTION     RIGHT EYE  . CHOLECYSTECTOMY    . cystoscopy  04/17/11   stent placed  . CYSTOSCOPY W/ URETERAL STENT PLACEMENT  04/17/2011   Procedure: CYSTOSCOPY WITH RETROGRADE PYELOGRAM/URETERAL STENT PLACEMENT;  Surgeon: Hanley Ben, MD;  Location: [facility];  Service: Urology;  Laterality: Left;  . CYSTOSCOPY WITH RETROGRADE PYELOGRAM, URETEROSCOPY AND STENT PLACEMENT Left 04/18/2013   Procedure: CYSTOSCOPY WITH LEFT RETROGRADE PYELOGRAM, LEFT URETEROSCOPY AND LASER LITHOTRIPSY LEFT STENT PLACEMENT;  Surgeon: Dutch Gray, MD;  Location: [facility];  Service: Urology;  Laterality: Left;  . HOLMIUM LASER APPLICATION Left 05/17/8293   Procedure: HOLMIUM LASER APPLICATION;  Surgeon: Dutch Gray, MD;  Location: [facility];  Service: Urology;  Laterality: Left;  . IRRIGATION AND DEBRIDEMENT ABSCESS Left 10/05/2014   Procedure: IAND D LEFT INDEX FINGER;  Surgeon: Dayna Barker, MD;  Location: [facility];  Service: Plastics;  Laterality: Left;  . KNEE SURGERY     bilateral ARTHROSCOPY X2 TO EACH KNEE  . MELANOMA EXCISION  2018  . S/P cysto & stents for kidnedy stone 11/08 by Dr. Reece Agar    . 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 SHOULDER SURGERY  04/2005   left by Dr. Berenice Primas    MEDICATIONS: No current facility-administered medications for this encounter.   Marland Kitchen amLODipine (NORVASC) 10 MG tablet  . aspirin EC 81 MG tablet  . carvedilol (COREG) 12.5 MG tablet  . COD LIVER OIL PO  . cyanocobalamin (,VITAMIN B-12,) 1000 MCG/ML injection  . glimepiride (AMARYL) 4 MG tablet  . hydrALAZINE (APRESOLINE) 50 MG tablet  . insulin lispro (HUMALOG KWIKPEN) 100 UNIT/ML KwikPen  . lisinopril-hydrochlorothiazide (ZESTORETIC) 20-25 MG tablet  . meclizine (ANTIVERT) 25 MG tablet  . potassium chloride SA (KLOR-CON M20) 20 MEQ tablet  . tamsulosin (FLOMAX) 0.4 MG CAPS capsule  . traMADol (ULTRAM)  50 MG tablet  . Accu-Chek Softclix Lancets lancets  . Blood Glucose Calibration (ACCU-CHEK AVIVA) SOLN  . Blood Glucose Monitoring Suppl (ACCU-CHEK AVIVA PLUS) w/Device KIT  . diclofenac sodium (VOLTAREN) 1 % GEL  . DROPLET PEN NEEDLES 30G X 8 MM MISC  . glucose blood (ONETOUCH ULTRA) test strip  . insulin glargine, 1 Unit Dial, ([name] SOLOSTAR) 300 UNIT/ML Solostar Pen  . oxymetazoline (AFRIN) 0.05 % nasal spray  . Pseudoeph-Doxylamine-DM-APAP (NYQUIL PO)  . SYRINGE-NEEDLE, DISP, 3 ML (B-D 3CC LUER-LOK SYR 25GX1") 25G X 1" 3 ML MISC    Myra Gianotti, PA-C Surgical Short Stay/Anesthesiology [facility] Phone [phone] [facility] Phone [phone] 03/28/2020 3:28 PM

## 2020-03-28 NOTE — H&P (Signed)
[name] Appointment: 03/12/2020 9:45 AM Location: [facility] Patient #: [id number] DOB: May 16, 1934 Widowed / Language: [name] / Race: White Male   History of Present Illness [name]; 03/12/2020 10:17 AM) The patient is a 86 year old male who presents with malignant melanoma. Pt is a very nice 86 yo M who is referred by Dr. Cindie Mathews for new diagnosis of left upper arm melanoma. His dermatologist was concerned about a black lesion on the left anterior deltoid just below the humeral head as well as a lesion on his back. The one on the arm was a malignant melanoma, 1.2 mm thick. Mitoses were negative, ulceration, regression, microsatellitosis, LVI, perineural invasion negative, peripheral margins were positive, deep margins negative. His back was MIS. He had wide local excision at the skin surgery center of the back lesion.   Of note, he has had a melanoma on his right lower neck in 2016. He hasn't had to have a lymph node biopsy in the past. He is a Psychologist, sport and exercise. Despite being 89 with DM, he is active and cares for his house as well as mows the yard.   path is from the skin surgery center [id number].    Past Surgical History [name], [place]; 03/12/2020 9:30 AM) Vasectomy   Diagnostic Studies History Sherron Ales Marshall-McBride, CMA; 03/12/2020 9:30 AM) Colonoscopy  >10 years ago  Allergies (Kheana Marshall-McBride, CMA; 03/12/2020 9:30 AM) No Known Drug Allergies  [03/12/2020]: No Known Allergies  [03/12/2020]: Allergies Reconciled   Medication History (Kheana Marshall-McBride, CMA; 03/12/2020 9:31 AM) traMADol HCl (50MG  Tablet, Oral) Active. amLODIPine Besylate (10MG  Tablet, Oral) Active. Carvedilol (12.5MG  Tablet, Oral) Active. Glimepiride (4MG  Tablet, Oral) Active. hydrALAZINE HCl (50MG  Tablet, Oral) Active. Lisinopril-hydroCHLOROthiazide (20-25MG  Tablet, Oral) Active. Potassium Chloride Crys ER (20MEQ Tablet ER, Oral)  Active. Tamsulosin HCl (0.4MG  Capsule, Oral) Active. 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  Family History [name], CMA; 03/12/2020 9:30 AM) Diabetes Mellitus  Daughter, Mother. Family history unknown  First Degree Relatives  Heart Disease  Mother.  Other Problems Sherron Ales Marshall-McBride, CMA; 03/12/2020 9:30 AM) Diabetes Mellitus     Review of Systems (Kheana Marshall-McBride CMA; 03/12/2020 9:30 AM) General Not Present- Appetite Loss, Chills, Fatigue, Fever, Night Sweats, Weight Gain and Weight Loss. Skin Present- Change in Wart/Mole, Dryness and New Lesions. Not Present- Hives, Jaundice, Non-Healing Wounds, Rash and Ulcer. HEENT Present- Hearing Loss and Wears glasses/contact lenses. Not Present- Earache, Hoarseness, Nose Bleed, Oral Ulcers, Ringing in the Ears, Seasonal Allergies, Sinus Pain, Sore Throat, Visual Disturbances and Yellow Eyes. Respiratory Not Present- Bloody sputum, Chronic Cough, Difficulty Breathing, Snoring and Wheezing. Breast Not Present- Breast Mass, Breast Pain, Nipple Discharge and Skin Changes. Cardiovascular Present- Swelling of Extremities. Not Present- Chest Pain, Difficulty Breathing Lying Down, Leg Cramps, Palpitations, Rapid Heart Rate and Shortness of Breath. Gastrointestinal Not Present- Abdominal Pain, Bloating, Bloody Stool, Change in Bowel Habits, Chronic diarrhea, Constipation, Difficulty Swallowing, Excessive gas, Gets full quickly at meals, Hemorrhoids, Indigestion, Nausea, Rectal Pain and Vomiting. Male Genitourinary Not Present- Blood in Urine, Change in Urinary Stream, Frequency, Impotence, Nocturia, Painful Urination, Urgency and Urine Leakage. Musculoskeletal Present- Joint Pain. Not Present- Back Pain, Joint Stiffness, Muscle Pain, Muscle Weakness and Swelling of Extremities. Neurological Present- Trouble  walking. Not Present- Decreased Memory, Fainting, Headaches, Numbness, Seizures, Tingling, Tremor and Weakness. Psychiatric Not Present- Anxiety, Bipolar, Change in Sleep Pattern, Depression, Fearful and Frequent crying.  Endocrine Not Present- Cold Intolerance, Excessive Hunger, Hair Changes, Heat Intolerance, Hot flashes and New Diabetes. Hematology Not Present- Blood Thinners, Easy Bruising, Excessive bleeding, Gland problems, HIV and Persistent Infections.  Vitals (Kheana Marshall-McBride CMA; 03/12/2020 9:31 AM) 03/12/2020 9:31 AM Weight: 236.38 lb Temp.: 98.46F  Pulse: 88 (Regular)  P.OX: 93% (Room air) BP: 132/82(Sitting, Left Arm, Standard)       Physical Exam [name]; 03/12/2020 10:18 AM) General Mental Status-Alert. General Appearance-Consistent with stated age. Hydration-Well hydrated. Voice-Normal.  Integumentary Note: diffuse skin changes. scar on left upper arm from biopsy. Still a slight bit of pigment left. back excision with dressing in place.   Head and Neck Head-normocephalic, atraumatic with no lesions or palpable masses. Trachea-midline. Thyroid Gland Characteristics - normal size and consistency.  Eye Eyeball - Bilateral-Extraocular movements intact. Sclera/Conjunctiva - Bilateral-No scleral icterus.  Chest and Lung Exam Chest and lung exam reveals -quiet, even and easy respiratory effort with no use of accessory muscles and on auscultation, normal breath sounds, no adventitious sounds and normal vocal resonance. Inspection Chest Wall - Normal. Back - normal.  Cardiovascular Cardiovascular examination reveals -normal heart sounds, regular rate and rhythm with no murmurs and normal pedal pulses bilaterally.  Abdomen Inspection Inspection of the abdomen reveals - No Hernias. Palpation/Percussion Palpation and Percussion of the abdomen reveal - Soft, Non Tender, No Rebound tenderness, No Rigidity (guarding) and No  hepatosplenomegaly. Auscultation Auscultation of the abdomen reveals - Bowel sounds normal.  Neurologic Neurologic evaluation reveals -alert and oriented x 3 with no impairment of recent or remote memory. Mental Status-Normal. Note: slightly hard of hearing.   Musculoskeletal Global Assessment -Note: no gross deformities.  Normal Exam - Left-Upper Extremity Strength Normal and Lower Extremity Strength Normal. Normal Exam - Right-Upper Extremity Strength Normal and Lower Extremity Strength Normal.  Lymphatic Head & Neck  General Head & Neck Lymphatics: Bilateral - Description - Normal. Axillary  General Axillary Region: Bilateral - Description - Normal. Tenderness - Non Tender. Femoral & Inguinal  Generalized Femoral & Inguinal Lymphatics: Bilateral - Description - No Generalized lymphadenopathy.    Assessment & Plan [name]; 03/12/2020 10:19 AM) MELANOMA OF LEFT UPPER ARM (C43.62) Impression: Will plan wide local excision with advancement flap closure of left upper arm melanoma and SLN bx. He is a robust 86 yo and very active. This would change our management. I discussed the surgery with the patient and his son. I reviewed risks including bleeding, infection, wound breakdown, seroma, and others like heart/lung complications.  I will plan this urgently given the diagnosis. Current Plans Pt Education - Melanoma: skin cancer You are being scheduled for surgery- Our schedulers will call you.  You should hear from our office's scheduling department within 5 working days about the location, date, and time of surgery. We try to make accommodations for patient's preferences in scheduling surgery, but sometimes the OR schedule or the surgeon's schedule prevents [place] from making those accommodations.  If you have not heard from our office [phone]) in 5 working days, call the office and ask for your surgeon's nurse.  If you have other questions about your  diagnosis, plan, or surgery, call the office and ask for your surgeon's nurse.

## 2020-03-29 ENCOUNTER — Emergency Department (HOSPITAL_COMMUNITY)
Admission: EM | Admit: 2020-03-29 | Discharge: 2020-03-29 | Disposition: A | Discharge status: Home / Self Care | Payer: Medicare Other | Source: Home / Self Care

## 2020-03-29 ENCOUNTER — Other Ambulatory Visit: Payer: Self-pay

## 2020-03-29 ENCOUNTER — Encounter (HOSPITAL_COMMUNITY): Payer: Self-pay | Admitting: General Surgery

## 2020-03-29 ENCOUNTER — Encounter (HOSPITAL_COMMUNITY)
Admission: RE | Disposition: A | Discharge status: Home / Self Care | Payer: Self-pay | Source: Home / Self Care | Attending: General Surgery

## 2020-03-29 ENCOUNTER — Ambulatory Visit (HOSPITAL_COMMUNITY): Payer: Medicare Other | Admitting: Vascular Surgery

## 2020-03-29 ENCOUNTER — Ambulatory Visit (HOSPITAL_COMMUNITY)
Admission: RE | Admit: 2020-03-29 | Discharge: 2020-03-29 | Disposition: A | Discharge status: Home / Self Care | Payer: Medicare Other | Source: Ambulatory Visit | Attending: General Surgery | Admitting: General Surgery

## 2020-03-29 ENCOUNTER — Encounter (HOSPITAL_COMMUNITY): Payer: Self-pay | Admitting: Emergency Medicine

## 2020-03-29 ENCOUNTER — Ambulatory Visit (HOSPITAL_COMMUNITY)
Admission: RE | Admit: 2020-03-29 | Discharge: 2020-03-29 | Disposition: A | Discharge status: Home / Self Care | Payer: Medicare Other | Attending: General Surgery | Admitting: General Surgery

## 2020-03-29 DIAGNOSIS — Z5321 Procedure and treatment not carried out due to patient leaving prior to being seen by health care provider: Secondary | ICD-10-CM | POA: Insufficient documentation

## 2020-03-29 DIAGNOSIS — D2362 Other benign neoplasm of skin of left upper limb, including shoulder: Secondary | ICD-10-CM | POA: Diagnosis not present

## 2020-03-29 DIAGNOSIS — Z79891 Long term (current) use of opiate analgesic: Secondary | ICD-10-CM | POA: Diagnosis not present

## 2020-03-29 DIAGNOSIS — Z794 Long term (current) use of insulin: Secondary | ICD-10-CM | POA: Diagnosis not present

## 2020-03-29 DIAGNOSIS — I509 Heart failure, unspecified: Secondary | ICD-10-CM | POA: Diagnosis not present

## 2020-03-29 DIAGNOSIS — Z79899 Other long term (current) drug therapy: Secondary | ICD-10-CM | POA: Diagnosis not present

## 2020-03-29 DIAGNOSIS — C4362 Malignant melanoma of left upper limb, including shoulder: Secondary | ICD-10-CM | POA: Insufficient documentation

## 2020-03-29 DIAGNOSIS — E119 Type 2 diabetes mellitus without complications: Secondary | ICD-10-CM | POA: Diagnosis not present

## 2020-03-29 DIAGNOSIS — R58 Hemorrhage, not elsewhere classified: Secondary | ICD-10-CM

## 2020-03-29 DIAGNOSIS — L7621 Postprocedural hemorrhage and hematoma of skin and subcutaneous tissue following a dermatologic procedure: Secondary | ICD-10-CM | POA: Diagnosis not present

## 2020-03-29 DIAGNOSIS — C4361 Malignant melanoma of right upper limb, including shoulder: Secondary | ICD-10-CM | POA: Diagnosis not present

## 2020-03-29 DIAGNOSIS — I110 Hypertensive heart disease with heart failure: Secondary | ICD-10-CM | POA: Diagnosis not present

## 2020-03-29 DIAGNOSIS — Z85820 Personal history of malignant melanoma of skin: Secondary | ICD-10-CM | POA: Diagnosis not present

## 2020-03-29 DIAGNOSIS — Z7982 Long term (current) use of aspirin: Secondary | ICD-10-CM | POA: Diagnosis not present

## 2020-03-29 DIAGNOSIS — L7622 Postprocedural hemorrhage and hematoma of skin and subcutaneous tissue following other procedure: Secondary | ICD-10-CM | POA: Insufficient documentation

## 2020-03-29 DIAGNOSIS — G8918 Other acute postprocedural pain: Secondary | ICD-10-CM | POA: Diagnosis not present

## 2020-03-29 DIAGNOSIS — N400 Enlarged prostate without lower urinary tract symptoms: Secondary | ICD-10-CM | POA: Insufficient documentation

## 2020-03-29 DIAGNOSIS — Z7984 Long term (current) use of oral hypoglycemic drugs: Secondary | ICD-10-CM | POA: Insufficient documentation

## 2020-03-29 DIAGNOSIS — I10 Essential (primary) hypertension: Secondary | ICD-10-CM | POA: Diagnosis not present

## 2020-03-29 HISTORY — PX: MELANOMA EXCISION WITH SENTINEL LYMPH NODE BIOPSY: SHX5267

## 2020-03-29 LAB — BASIC METABOLIC PANEL
Anion gap: 13 (ref 5–15)
BUN: 25 mg/dL — ABNORMAL HIGH (ref 8–23)
CO2: 23 mmol/L (ref 22–32)
Calcium: 9.7 mg/dL (ref 8.9–10.3)
Chloride: 104 mmol/L (ref 98–111)
Creatinine, Ser: 1.18 mg/dL (ref 0.61–1.24)
GFR, Estimated: >60 mL/min (ref >60)
Glucose, Bld: 228 mg/dL — ABNORMAL HIGH (ref 70–99)
Potassium: 3.4 mmol/L — ABNORMAL LOW (ref 3.5–5.1)
Sodium: 140 mmol/L (ref 135–145)

## 2020-03-29 LAB — CBC
HCT: 44.2 % (ref 39.0–52.0)
Hemoglobin: 15.2 g/dL (ref 13.0–17.0)
MCH: 32 pg (ref 26.0–34.0)
MCHC: 34.4 g/dL (ref 30.0–36.0)
MCV: 93.1 fL (ref 80.0–100.0)
Platelets: 287 10*3/uL (ref 150–400)
RBC: 4.75 MIL/uL (ref 4.22–5.81)
RDW: 13.7 % (ref 11.5–15.5)
WBC: 8.1 10*3/uL (ref 4.0–10.5)
nRBC: 0 % (ref 0.0–0.2)

## 2020-03-29 LAB — GLUCOSE, CAPILLARY
Glucose-Capillary: 231 mg/dL — ABNORMAL HIGH (ref 70–99)
Glucose-Capillary: 274 mg/dL — ABNORMAL HIGH (ref 70–99)
Glucose-Capillary: 231 mg/dL — ABNORMAL HIGH (ref 70–99)

## 2020-03-29 SURGERY — MELANOMA EXCISION WITH SENTINEL LYMPH NODE BIOPSY
Anesthesia: Regional | Site: Arm Upper | Laterality: Left

## 2020-03-29 MED ORDER — LIDOCAINE HCL 1 % IJ SOLN
INTRAMUSCULAR | Status: AC
  Filled 2020-03-29: qty 20

## 2020-03-29 MED ORDER — FENTANYL CITRATE (PF) 100 MCG/2ML IJ SOLN
INTRAMUSCULAR | Status: DC | PRN
  Administered 2020-03-29: 25 ug via INTRAVENOUS
  Administered 2020-03-29: 50 ug via INTRAVENOUS
  Administered 2020-03-29: 25 ug via INTRAVENOUS

## 2020-03-29 MED ORDER — EPHEDRINE 5 MG/ML INJ
INTRAVENOUS | Status: AC
  Filled 2020-03-29: qty 10

## 2020-03-29 MED ORDER — OXYCODONE HCL 5 MG PO TABS
5.0000 mg | ORAL_TABLET | Freq: Once | ORAL | Status: DC | PRN
Start: 2020-03-29 — End: 2020-03-29

## 2020-03-29 MED ORDER — PHENYLEPHRINE 40 MCG/ML (10ML) SYRINGE FOR IV PUSH (FOR BLOOD PRESSURE SUPPORT)
PREFILLED_SYRINGE | INTRAVENOUS | Status: AC
  Filled 2020-03-29: qty 10

## 2020-03-29 MED ORDER — BUPIVACAINE-EPINEPHRINE (PF) 0.5% -1:200000 IJ SOLN
INTRAMUSCULAR | Status: DC | PRN
  Administered 2020-03-29: 20 mL via PERINEURAL

## 2020-03-29 MED ORDER — CHLORHEXIDINE GLUCONATE CLOTH 2 % EX PADS: 6.0000 | MEDICATED_PAD | Freq: Once | CUTANEOUS | Status: DC

## 2020-03-29 MED ORDER — DEXAMETHASONE SODIUM PHOSPHATE 10 MG/ML IJ SOLN
INTRAMUSCULAR | Status: AC
  Filled 2020-03-29: qty 1

## 2020-03-29 MED ORDER — CEFAZOLIN SODIUM-DEXTROSE 2-4 GM/100ML-% IV SOLN
2.0000 g | INTRAVENOUS | Status: AC
  Administered 2020-03-29: 2 g via INTRAVENOUS
  Filled 2020-03-29: qty 100

## 2020-03-29 MED ORDER — METHYLENE BLUE 1 % INJ SOLN
INTRAMUSCULAR | Status: DC | PRN
  Administered 2020-03-29: 1 mL via INTRADERMAL

## 2020-03-29 MED ORDER — TRAMADOL HCL 50 MG PO TABS: 50.0000 mg | ORAL_TABLET | Freq: Four times a day (QID) | ORAL | 0 refills | Status: DC | PRN

## 2020-03-29 MED ORDER — INSULIN ASPART 100 UNIT/ML ~~LOC~~ SOLN
SUBCUTANEOUS | Status: AC
  Filled 2020-03-29: qty 1

## 2020-03-29 MED ORDER — TECHNETIUM TC 99M TILMANOCEPT KIT
1.0000 | PACK | Freq: Once | INTRAVENOUS | Status: AC | PRN
  Administered 2020-03-29: 1 via INTRADERMAL

## 2020-03-29 MED ORDER — 0.9 % SODIUM CHLORIDE (POUR BTL) OPTIME
TOPICAL | Status: DC | PRN
  Administered 2020-03-29: 1000 mL

## 2020-03-29 MED ORDER — FENTANYL CITRATE (PF) 100 MCG/2ML IJ SOLN
25.0000 ug | INTRAMUSCULAR | Status: DC | PRN
Start: 2020-03-29 — End: 2020-03-29

## 2020-03-29 MED ORDER — INSULIN ASPART 100 UNIT/ML ~~LOC~~ SOLN
5.0000 [IU] | Freq: Once | SUBCUTANEOUS | Status: AC
  Administered 2020-03-29: 5 [IU] via SUBCUTANEOUS

## 2020-03-29 MED ORDER — ORAL CARE MOUTH RINSE: 15.0000 mL | Freq: Once | OROMUCOSAL | Status: AC

## 2020-03-29 MED ORDER — FENTANYL CITRATE (PF) 250 MCG/5ML IJ SOLN
INTRAMUSCULAR | Status: AC
  Filled 2020-03-29: qty 5

## 2020-03-29 MED ORDER — METHYLENE BLUE 0.5 % INJ SOLN
INTRAVENOUS | Status: AC
  Filled 2020-03-29: qty 10

## 2020-03-29 MED ORDER — AMISULPRIDE (ANTIEMETIC) 5 MG/2ML IV SOLN
10.0000 mg | Freq: Once | INTRAVENOUS | Status: AC
  Administered 2020-03-29: 10 mg via INTRAVENOUS

## 2020-03-29 MED ORDER — LACTATED RINGERS IV SOLN: INTRAVENOUS | Status: DC

## 2020-03-29 MED ORDER — LIDOCAINE 2% (20 MG/ML) 5 ML SYRINGE
INTRAMUSCULAR | Status: DC | PRN
  Administered 2020-03-29: 60 mg via INTRAVENOUS

## 2020-03-29 MED ORDER — ONDANSETRON HCL 4 MG/2ML IJ SOLN
INTRAMUSCULAR | Status: DC | PRN
  Administered 2020-03-29: 4 mg via INTRAVENOUS

## 2020-03-29 MED ORDER — ONDANSETRON HCL 4 MG/2ML IJ SOLN
INTRAMUSCULAR | Status: AC
  Filled 2020-03-29: qty 2

## 2020-03-29 MED ORDER — PHENYLEPHRINE 40 MCG/ML (10ML) SYRINGE FOR IV PUSH (FOR BLOOD PRESSURE SUPPORT)
PREFILLED_SYRINGE | INTRAVENOUS | Status: DC | PRN
  Administered 2020-03-29: 80 ug via INTRAVENOUS

## 2020-03-29 MED ORDER — DEXAMETHASONE SODIUM PHOSPHATE 4 MG/ML IJ SOLN
INTRAMUSCULAR | Status: DC | PRN
  Administered 2020-03-29: 4 mg via INTRAVENOUS

## 2020-03-29 MED ORDER — CHLORHEXIDINE GLUCONATE 0.12 % MT SOLN
15.0000 mL | Freq: Once | OROMUCOSAL | Status: AC
  Administered 2020-03-29: 15 mL via OROMUCOSAL
  Filled 2020-03-29: qty 15

## 2020-03-29 MED ORDER — BUPIVACAINE-EPINEPHRINE (PF) 0.25% -1:200000 IJ SOLN
INTRAMUSCULAR | Status: AC
  Filled 2020-03-29: qty 20

## 2020-03-29 MED ORDER — ACETAMINOPHEN 500 MG PO TABS
1000.0000 mg | ORAL_TABLET | ORAL | Status: AC
  Administered 2020-03-29: 1000 mg via ORAL
  Filled 2020-03-29: qty 2

## 2020-03-29 MED ORDER — ONDANSETRON HCL 4 MG/2ML IJ SOLN
4.0000 mg | Freq: Four times a day (QID) | INTRAMUSCULAR | Status: AC | PRN
  Administered 2020-03-29: 4 mg via INTRAVENOUS

## 2020-03-29 MED ORDER — PROPOFOL 10 MG/ML IV BOLUS
INTRAVENOUS | Status: DC | PRN
  Administered 2020-03-29: 180 mg via INTRAVENOUS

## 2020-03-29 MED ORDER — PROPOFOL 10 MG/ML IV BOLUS
INTRAVENOUS | Status: AC
  Filled 2020-03-29: qty 20

## 2020-03-29 MED ORDER — LIDOCAINE HCL 1 % IJ SOLN
INTRAMUSCULAR | Status: DC | PRN
  Administered 2020-03-29: 2 mL
  Administered 2020-03-29: 28 mL

## 2020-03-29 MED ORDER — MIDAZOLAM HCL 5 MG/5ML IJ SOLN
INTRAMUSCULAR | Status: DC | PRN
  Administered 2020-03-29 (×2): .5 mg via INTRAVENOUS

## 2020-03-29 MED ORDER — MIDAZOLAM HCL 2 MG/2ML IJ SOLN
INTRAMUSCULAR | Status: AC
  Filled 2020-03-29: qty 2

## 2020-03-29 MED ORDER — AMISULPRIDE (ANTIEMETIC) 5 MG/2ML IV SOLN
INTRAVENOUS | Status: AC
  Filled 2020-03-29: qty 4

## 2020-03-29 MED ORDER — STERILE WATER FOR IRRIGATION IR SOLN
Status: DC | PRN
  Administered 2020-03-29: 1000 mL

## 2020-03-29 MED ORDER — OXYCODONE HCL 5 MG/5ML PO SOLN
5.0000 mg | Freq: Once | ORAL | Status: DC | PRN
Start: 2020-03-29 — End: 2020-03-29

## 2020-03-29 MED ORDER — EPHEDRINE SULFATE-NACL 50-0.9 MG/10ML-% IV SOSY
PREFILLED_SYRINGE | INTRAVENOUS | Status: DC | PRN
  Administered 2020-03-29: 10 mg via INTRAVENOUS
  Administered 2020-03-29: 5 mg via INTRAVENOUS
  Administered 2020-03-29 (×3): 10 mg via INTRAVENOUS

## 2020-03-29 MED ORDER — LIDOCAINE 2% (20 MG/ML) 5 ML SYRINGE
INTRAMUSCULAR | Status: AC
  Filled 2020-03-29: qty 5

## 2020-03-29 SURGICAL SUPPLY — 58 items
ADH SKN CLS APL DERMABOND .7 (GAUZE/BANDAGES/DRESSINGS) ×1
APL PRP STRL LF DISP 70% ISPRP (MISCELLANEOUS) ×1
APL SKNCLS STERI-STRIP NONHPOA (GAUZE/BANDAGES/DRESSINGS) ×1
BENZOIN TINCTURE PRP APPL 2/3 (GAUZE/BANDAGES/DRESSINGS) ×1 IMPLANT
BLADE SURG 10 STRL SS (BLADE) ×2 IMPLANT
BNDG COHESIVE 4X5 TAN STRL (GAUZE/BANDAGES/DRESSINGS) IMPLANT
BNDG GAUZE ELAST 4 BULKY (GAUZE/BANDAGES/DRESSINGS) ×2 IMPLANT
CANISTER SUCT 3000ML PPV (MISCELLANEOUS) ×2 IMPLANT
CHLORAPREP W/TINT 26 (MISCELLANEOUS) ×2 IMPLANT
CLIP VESOCCLUDE MED 24/CT (CLIP) ×2 IMPLANT
CLIP VESOCCLUDE SM WIDE 24/CT (CLIP) ×2 IMPLANT
CNTNR URN SCR LID CUP LEK RST (MISCELLANEOUS) ×3 IMPLANT
CONT SPEC 4OZ STRL OR WHT (MISCELLANEOUS) ×6
COVER PROBE W GEL 5X96 (DRAPES) ×2 IMPLANT
COVER SURGICAL LIGHT HANDLE (MISCELLANEOUS) ×2 IMPLANT
COVER WAND RF STERILE (DRAPES) ×2 IMPLANT
DECANTER SPIKE VIAL GLASS SM (MISCELLANEOUS) ×2 IMPLANT
DERMABOND ADVANCED (GAUZE/BANDAGES/DRESSINGS) ×1
DERMABOND ADVANCED .7 DNX12 (GAUZE/BANDAGES/DRESSINGS) IMPLANT
DRAPE HALF SHEET 40X57 (DRAPES) ×2 IMPLANT
DRSG TEGADERM 4X4.75 (GAUZE/BANDAGES/DRESSINGS) ×4 IMPLANT
ELECT REM PT RETURN 9FT ADLT (ELECTROSURGICAL) ×2
ELECTRODE REM PT RTRN 9FT ADLT (ELECTROSURGICAL) ×1 IMPLANT
GAUZE SPONGE 2X2 8PLY STRL LF (GAUZE/BANDAGES/DRESSINGS) ×1 IMPLANT
GLOVE SURG UNDER LTX SZ6.5 (GLOVE) ×2 IMPLANT
GOWN STRL REUS W/ TWL LRG LVL3 (GOWN DISPOSABLE) ×2 IMPLANT
GOWN STRL REUS W/TWL 2XL LVL3 (GOWN DISPOSABLE) ×4 IMPLANT
GOWN STRL REUS W/TWL LRG LVL3 (GOWN DISPOSABLE) ×4
KIT BASIN OR (CUSTOM PROCEDURE TRAY) ×2 IMPLANT
KIT TURNOVER KIT B (KITS) ×2 IMPLANT
MARKER SKIN DUAL TIP RULER LAB (MISCELLANEOUS) ×2 IMPLANT
NDL 18GX1X1/2 (RX/OR ONLY) (NEEDLE) ×1 IMPLANT
NDL FILTER BLUNT 18X1 1/2 (NEEDLE) IMPLANT
NDL HYPO 25GX1X1/2 BEV (NEEDLE) ×1 IMPLANT
NEEDLE 18GX1X1/2 (RX/OR ONLY) (NEEDLE) ×2 IMPLANT
NEEDLE 22X1 1/2 (OR ONLY) (NEEDLE) ×2 IMPLANT
NEEDLE FILTER BLUNT 18X 1/2SAF (NEEDLE) ×1
NEEDLE FILTER BLUNT 18X1 1/2 (NEEDLE) ×1 IMPLANT
NEEDLE HYPO 25GX1X1/2 BEV (NEEDLE) ×2 IMPLANT
NS IRRIG 1000ML POUR BTL (IV SOLUTION) ×2 IMPLANT
PACK GENERAL/GYN (CUSTOM PROCEDURE TRAY) ×2 IMPLANT
PACK UNIVERSAL I (CUSTOM PROCEDURE TRAY) ×1 IMPLANT
PAD ARMBOARD 7.5X6 YLW CONV (MISCELLANEOUS) ×4 IMPLANT
PENCIL SMOKE EVACUATOR (MISCELLANEOUS) ×2 IMPLANT
SPECIMEN JAR MEDIUM (MISCELLANEOUS) ×2 IMPLANT
SPONGE GAUZE 2X2 STER 10/PKG (GAUZE/BANDAGES/DRESSINGS) ×1
STOCKINETTE IMPERVIOUS 9X36 MD (GAUZE/BANDAGES/DRESSINGS) ×2 IMPLANT
STRIP CLOSURE SKIN 1/2X4 (GAUZE/BANDAGES/DRESSINGS) ×2 IMPLANT
SUT ETHILON 2 0 FS 18 (SUTURE) ×4 IMPLANT
SUT MNCRL AB 4-0 PS2 18 (SUTURE) ×3 IMPLANT
SUT SILK 2 0 PERMA HAND 18 BK (SUTURE) ×2 IMPLANT
SUT VIC AB 2-0 SH 27 (SUTURE) ×4
SUT VIC AB 2-0 SH 27XBRD (SUTURE) ×2 IMPLANT
SUT VIC AB 3-0 SH 27 (SUTURE) ×4
SUT VIC AB 3-0 SH 27X BRD (SUTURE) ×2 IMPLANT
SYR CONTROL 10ML LL (SYRINGE) ×4 IMPLANT
TOWEL GREEN STERILE (TOWEL DISPOSABLE) ×2 IMPLANT
TOWEL GREEN STERILE FF (TOWEL DISPOSABLE) ×2 IMPLANT

## 2020-03-29 NOTE — ED Triage Notes (Signed)
Emergency Medicine Provider Triage Evaluation Note I received a call from Dr. Barry Dienes stating that due to a miscommunication her patient had postoperative bleeding had ended up in the emergency room.  She asked me to evaluate him and see if his bleeding is significant enough that he requires being in the emergency room or if he would be okay for her to see her office immediately.  Patient reports that he had bleeding from his wound on his arm.  He denies any trauma states this started spontaneously while at home.  He has had no change in his shirt 3 times due to bleeding.  On my exam he has a Tegaderm on top of multiple 2 x 2's with blood pooling in the inferior/dependent aspect of a Tegaderm and leaking out the posterior side of the dressing.  This was removed.  Steri-Strips are saturated, blood and one Steri-Strip fell off.  He was observed for 5 minutes without significant active bleeding.  There are 1-2 spots with minimal oozing.  Pictures were obtained and placed in the chart.  I called Dr. Barry Dienes back, patient is hemodynamically stable and does not have significant active bleeding on my exam.  She stated that she viewed the images and is in agreement with evaluating patient in the office.  I discussed plan with patient and his son who is also present.  They are in agreement with plan and state they will go directly to her office.  No charge note.    Lorin Glass, [place] 03/29/20 1652

## 2020-03-29 NOTE — ED Triage Notes (Signed)
Pt. Stated, I had a lymphoma removed from my upper left arm around 830. On in the day it started to bleed and keep bleeding, I had to change my shirt x 3. Dr. Rockey Situ me to come here.

## 2020-03-29 NOTE — Transfer of Care (Signed)
Immediate Anesthesia Transfer of Care Note  Patient: DAYSHAUN WHOBREY  Procedure(s) Performed: WIDE LOCAL EXCISION, ADVANCED FLAP CLOSURE LEFT ARM MELANOMA DEFECT WITH SENTINEL LYMPH NODE MAPPING AND BIOPSY (Left Arm Upper)  Patient Location: PACU  Anesthesia Type:General and Regional  Level of Consciousness: awake, alert  and oriented  Airway & Oxygen Therapy: Patient Spontanous Breathing and Patient connected to nasal cannula oxygen  Post-op Assessment: Report given to RN and Post -op Vital signs reviewed and stable  Post vital signs: Reviewed and stable  Last Vitals:  Vitals Value Taken Time  BP 163/99 03/29/20 0957  Temp    Pulse 84 03/29/20 0959  Resp 17 03/29/20 0959  SpO2 90 % 03/29/20 0959  Vitals shown include unvalidated device data.  Last Pain:  Vitals:   03/29/20 0625  TempSrc:   PainSc: 0-No pain      Patients Stated Pain Goal: 5 (63/87/56 [id number])  Complications: No complications documented.

## 2020-03-29 NOTE — Discharge Instructions (Addendum)
Please go directly to Dr. Chuck Hint office.

## 2020-03-29 NOTE — Anesthesia Postprocedure Evaluation (Signed)
Anesthesia Post Note  [name]  Procedure(s) Performed: WIDE LOCAL EXCISION, ADVANCED FLAP CLOSURE LEFT ARM MELANOMA DEFECT WITH SENTINEL LYMPH NODE MAPPING AND BIOPSY (Left Arm Upper)     Patient location during evaluation: PACU Anesthesia Type: Regional and General Level of consciousness: awake and alert Pain management: pain level controlled Vital Signs Assessment: post-procedure vital signs reviewed and stable Respiratory status: spontaneous breathing, nonlabored ventilation, respiratory function stable and patient connected to nasal cannula oxygen Cardiovascular status: blood pressure returned to baseline and stable Postop Assessment: no apparent nausea or vomiting Anesthetic complications: no   No complications documented.  Last Vitals:  Vitals:   03/29/20 1057 03/29/20 1100  BP: (!) 156/93 (!) 156/93  Pulse: 85 91  Resp: 20 (!) 29  Temp: 36.6 C   SpO2: 93% 94%    Last Pain:  Vitals:   03/29/20 1100  TempSrc:   PainSc: 0-No pain                 [address]lynn [address] S

## 2020-03-29 NOTE — Op Note (Signed)
PRE-OPERATIVE DIAGNOSIS: [id number] left posterior upper arm melanoma  POST-OPERATIVE DIAGNOSIS:  Same  PROCEDURE:  Procedure(s): Wide local excision 2 cm margins, advancement flap closure for defect 9.4 cm x 5 cm, left axillary sentinel lymph node mapping and biopsy  SURGEON:  Surgeon(s): Stark Klein, MD  ASSIST:  Judyann Munson, RNFA  ANESTHESIA:   General, pec block, and local  DRAINS: none   LOCAL MEDICATIONS USED:  MARCAINE    and XYLOCAINE   SPECIMEN:  Source of Specimen:  wide local excision left upper arm melanoma, left axillary sentinel lymph nodes 1-3  FINDINGS:  No gross disease, SLN #1 cps 17, SLN #2 cps 155; SLN #3 cps 124  DISPOSITION OF SPECIMEN:  PATHOLOGY  COUNTS:  YES  PLAN OF CARE: Discharge to home after PACU  PATIENT DISPOSITION:  PACU - hemodynamically stable.    PROCEDURE:   Pt was identified in the holding area, taken to the OR, and placed supine on the OR table.  General anesthesia was induced.  The patient was placed into the supine position.  The left axilla and arm were prepped and draped in sterile fashion.  Time out was performed according to the surgical safety checklist.  When all was correct, we continued.  One mL methylene blue was injected intradermally around the melanoma biopsy site.    The melanoma was identified and 2 cm margins were marked out.  Local anesthetic was administered around the melanoma.  This was then fashioned into an ellipse.  Local was administered under the melanoma and the adjacent tissue.  A #10 blade was used to incise the skin around the melanoma.  The cautery was used to take the dissection down to the fascia.  The skin was marked in situ with orientation sutures.  The cautery was used to take the specimen off the fascia, and it was passed off the table.    Skin hooks were used to elevate the edges of the incision and the skin was freed up in all directions in order to create advancement flaps.  This was pulled together  in an longitudinal orientation. The skin was pulled together to check the tension. . Deep interrupted 2-0 vicryl sutures were placed to relieve tension.  The skin was then reapproximated with 3-0 interrupted vicryl deep dermal sutures and 4-0 monocryl running subcuticular sutures.  Three 2-0 nylon horizontal mattress sutures were placed as well.    The point of maximum signal intensity was identified with the neoprobe in the axilla.  A 4 cm incision was made with a #15 blade.  The subcutaneous tissues were divided with the cautery.  A Weitlaner retractor was used to assist with visualization.  The tonsil clamp was used to bluntly dissect the axillary fat pad.  Three deep sentinel lymph nodes were identified as described above.  The lymphovascular channels were clipped with hemoclips.  The nodes were passed off as specimens.  Hemostasis was achieved with the cautery.  The axilla was irrigated and closed with 3-0 Vicryl deep dermal interrupted sutures and 4-0 Monocryl running subcuticular suture.  The axilla was dressed with dermabond.    The melanoma site was cleaned, dried, and dressed with Benzoin, steristrips, gauze, and tegaderm.    Needle, sponge, and instrument counts were correct.  The patient was awakened from anesthesia and taken to the PACU in stable condition.

## 2020-03-29 NOTE — Anesthesia Procedure Notes (Signed)
Procedure Name: LMA Insertion Date/Time: 03/29/2020 8:10 AM Performed by: Trinna Post., CRNA Pre-anesthesia Checklist: Patient identified, Emergency Drugs available, Suction available, Patient being monitored and Timeout performed Patient Re-evaluated:Patient Re-evaluated prior to induction Oxygen Delivery Method: Circle system utilized Preoxygenation: Pre-oxygenation with 100% oxygen Induction Type: IV induction LMA: LMA inserted LMA Size: 4.0 Number of attempts: 1 Placement Confirmation: positive ETCO2 and breath sounds checked- equal and bilateral Tube secured with: Tape Dental Injury: Teeth and Oropharynx as per pre-operative assessment

## 2020-03-29 NOTE — Interval H&P Note (Signed)
History and Physical Interval Note:  03/29/2020 7:54 AM  [name]  has presented today for surgery, with the diagnosis of LEFT ARM MELANOMA.  The various methods of treatment have been discussed with the patient and family. After consideration of risks, benefits and other options for treatment, the patient has consented to  Procedure(s) with comments: WIDE LOCAL EXCISION, ADVANCED FLAP CLOSURE LEFT ARM MELANOMA DEFECT WITH SENTINEL LYMPH NODE MAPPING AND BIOPSY (Left) - GEN AND PECTORAL BLOCK as a surgical intervention.  The patient's history has been reviewed, patient examined, no change in status, stable for surgery.  I have reviewed the patient's chart and labs.  Questions were answered to the patient's satisfaction.     Stark Klein

## 2020-03-29 NOTE — Discharge Instructions (Addendum)
[place] Office Phone Number [phone]   POST OP INSTRUCTIONS  Always review your discharge instruction sheet given to you by the facility where your surgery was performed.  IF YOU HAVE DISABILITY OR FAMILY LEAVE FORMS, YOU MUST BRING THEM TO THE OFFICE FOR PROCESSING.  DO NOT GIVE THEM TO YOUR DOCTOR.  1. A prescription for pain medication may be given to you upon discharge.  Take your pain medication as prescribed, if needed.  If narcotic pain medicine is not needed, then you may take acetaminophen (Tylenol) or ibuprofen (Advil) as needed. 2. Take your usually prescribed medications unless otherwise directed 3. If you need a refill on your pain medication, please contact your pharmacy.  They will contact our office to request authorization.  Prescriptions will not be filled after 5pm or on week-ends. 4. You should eat very light the first 24 hours after surgery, such as soup, crackers, pudding, etc.  Resume your normal diet the day after surgery 5. It is common to experience some constipation if taking pain medication after surgery.  Increasing fluid intake and taking a stool softener will usually help or prevent this problem from occurring.  A mild laxative (Milk of Magnesia or Miralax) should be taken according to package directions if there are no bowel movements after 48 hours. 6. You may shower in 48 hours.  The surgical glue will flake off in 2-3 weeks.   7. ACTIVITIES:  No strenuous activity or heavy lifting for 1 week.   a. You may drive when you no longer are taking prescription pain medication, you can comfortably wear a seatbelt, and you can safely maneuver your car and apply brakes. b. RETURN TO WORK:  __________1-2 weeks _______________ Dennis Bast should see your doctor in the office for a follow-up appointment approximately three-four weeks after your surgery.    WHEN TO CALL YOUR DOCTOR: 1. Fever over 101.0 2. Nausea and/or vomiting. 3. Extreme swelling or  bruising. 4. Continued bleeding from incision. 5. Increased pain, redness, or drainage from the incision.  The clinic staff is available to answer your questions during regular business hours.  Please dont hesitate to call and ask to speak to one of the nurses for clinical concerns.  If you have a medical emergency, go to the nearest emergency room or call 911.  A surgeon from [facility] is always on call at the hospital.  For further questions, please visit [email or web address]

## 2020-03-29 NOTE — Anesthesia Procedure Notes (Signed)
Anesthesia Regional Block: Pectoralis block   Pre-Anesthetic Checklist: ,, timeout performed, Correct Patient, Correct Site, Correct Laterality, Correct Procedure, Correct Position, site marked, Risks and benefits discussed,  Surgical consent,  Pre-op evaluation,  At surgeon's request and post-op pain management  Laterality: Left  Prep: chloraprep       Needles:  Injection technique: Single-shot  Needle Type: Echogenic Needle     Needle Length: 9cm  Needle Gauge: 21     Additional Needles:   Narrative:  Start time: 03/29/2020 7:00 AM End time: 03/29/2020 7:10 AM Injection made incrementally with aspirations every 5 mL.  Performed by: Personally  Anesthesiologist: Albertha Ghee, MD  Additional Notes: Pt tolerated the procedure well.

## 2020-03-30 ENCOUNTER — Encounter (HOSPITAL_COMMUNITY): Payer: Self-pay | Admitting: General Surgery

## 2020-04-01 ENCOUNTER — Emergency Department (HOSPITAL_COMMUNITY): Payer: Medicare Other

## 2020-04-01 ENCOUNTER — Other Ambulatory Visit: Payer: Self-pay

## 2020-04-01 ENCOUNTER — Encounter (HOSPITAL_COMMUNITY): Payer: Self-pay | Admitting: Internal Medicine

## 2020-04-01 ENCOUNTER — Inpatient Hospital Stay (HOSPITAL_COMMUNITY)
Admission: EM | Admit: 2020-04-01 | Discharge: 2020-04-06 | DRG: 311 | Disposition: A | Discharge status: Home Health | Payer: Medicare Other | Attending: Internal Medicine | Admitting: Internal Medicine

## 2020-04-01 DIAGNOSIS — I428 Other cardiomyopathies: Secondary | ICD-10-CM | POA: Diagnosis present

## 2020-04-01 DIAGNOSIS — Z794 Long term (current) use of insulin: Secondary | ICD-10-CM | POA: Diagnosis not present

## 2020-04-01 DIAGNOSIS — R110 Nausea: Secondary | ICD-10-CM | POA: Diagnosis not present

## 2020-04-01 DIAGNOSIS — R1013 Epigastric pain: Secondary | ICD-10-CM

## 2020-04-01 DIAGNOSIS — Z66 Do not resuscitate: Secondary | ICD-10-CM | POA: Diagnosis present

## 2020-04-01 DIAGNOSIS — I10 Essential (primary) hypertension: Secondary | ICD-10-CM | POA: Diagnosis present

## 2020-04-01 DIAGNOSIS — I5042 Chronic combined systolic (congestive) and diastolic (congestive) heart failure: Secondary | ICD-10-CM | POA: Diagnosis present

## 2020-04-01 DIAGNOSIS — N320 Bladder-neck obstruction: Secondary | ICD-10-CM | POA: Diagnosis not present

## 2020-04-01 DIAGNOSIS — Z79899 Other long term (current) drug therapy: Secondary | ICD-10-CM

## 2020-04-01 DIAGNOSIS — E785 Hyperlipidemia, unspecified: Secondary | ICD-10-CM | POA: Diagnosis not present

## 2020-04-01 DIAGNOSIS — Z8249 Family history of ischemic heart disease and other diseases of the circulatory system: Secondary | ICD-10-CM | POA: Diagnosis not present

## 2020-04-01 DIAGNOSIS — T148XXA Other injury of unspecified body region, initial encounter: Secondary | ICD-10-CM | POA: Diagnosis not present

## 2020-04-01 DIAGNOSIS — I110 Hypertensive heart disease with heart failure: Secondary | ICD-10-CM | POA: Diagnosis present

## 2020-04-01 DIAGNOSIS — I25118 Atherosclerotic heart disease of native coronary artery with other forms of angina pectoris: Secondary | ICD-10-CM

## 2020-04-01 DIAGNOSIS — Z885 Allergy status to narcotic agent status: Secondary | ICD-10-CM

## 2020-04-01 DIAGNOSIS — Z7982 Long term (current) use of aspirin: Secondary | ICD-10-CM

## 2020-04-01 DIAGNOSIS — M1990 Unspecified osteoarthritis, unspecified site: Secondary | ICD-10-CM | POA: Diagnosis not present

## 2020-04-01 DIAGNOSIS — I160 Hypertensive urgency: Secondary | ICD-10-CM | POA: Diagnosis not present

## 2020-04-01 DIAGNOSIS — N138 Other obstructive and reflux uropathy: Secondary | ICD-10-CM | POA: Diagnosis present

## 2020-04-01 DIAGNOSIS — L7631 Postprocedural hematoma of skin and subcutaneous tissue following a dermatologic procedure: Secondary | ICD-10-CM | POA: Diagnosis not present

## 2020-04-01 DIAGNOSIS — R778 Other specified abnormalities of plasma proteins: Secondary | ICD-10-CM | POA: Diagnosis not present

## 2020-04-01 DIAGNOSIS — R072 Precordial pain: Secondary | ICD-10-CM | POA: Diagnosis not present

## 2020-04-01 DIAGNOSIS — E669 Obesity, unspecified: Secondary | ICD-10-CM | POA: Diagnosis present

## 2020-04-01 DIAGNOSIS — I248 Other forms of acute ischemic heart disease: Secondary | ICD-10-CM | POA: Diagnosis not present

## 2020-04-01 DIAGNOSIS — E1159 Type 2 diabetes mellitus with other circulatory complications: Secondary | ICD-10-CM | POA: Diagnosis present

## 2020-04-01 DIAGNOSIS — I2510 Atherosclerotic heart disease of native coronary artery without angina pectoris: Secondary | ICD-10-CM | POA: Diagnosis present

## 2020-04-01 DIAGNOSIS — R0602 Shortness of breath: Secondary | ICD-10-CM | POA: Diagnosis not present

## 2020-04-01 DIAGNOSIS — M7989 Other specified soft tissue disorders: Secondary | ICD-10-CM | POA: Diagnosis not present

## 2020-04-01 DIAGNOSIS — Z20822 Contact with and (suspected) exposure to covid-19: Secondary | ICD-10-CM | POA: Diagnosis present

## 2020-04-01 DIAGNOSIS — Z6831 Body mass index (BMI) 31.0-31.9, adult: Secondary | ICD-10-CM

## 2020-04-01 DIAGNOSIS — Z87828 Personal history of other (healed) physical injury and trauma: Secondary | ICD-10-CM

## 2020-04-01 DIAGNOSIS — C4362 Malignant melanoma of left upper limb, including shoulder: Secondary | ICD-10-CM | POA: Diagnosis not present

## 2020-04-01 DIAGNOSIS — I152 Hypertension secondary to endocrine disorders: Secondary | ICD-10-CM | POA: Diagnosis present

## 2020-04-01 DIAGNOSIS — Y838 Other surgical procedures as the cause of abnormal reaction of the patient, or of later complication, without mention of misadventure at the time of the procedure: Secondary | ICD-10-CM | POA: Diagnosis present

## 2020-04-01 DIAGNOSIS — M7981 Nontraumatic hematoma of soft tissue: Secondary | ICD-10-CM | POA: Diagnosis not present

## 2020-04-01 DIAGNOSIS — E1165 Type 2 diabetes mellitus with hyperglycemia: Secondary | ICD-10-CM | POA: Diagnosis not present

## 2020-04-01 DIAGNOSIS — Z7984 Long term (current) use of oral hypoglycemic drugs: Secondary | ICD-10-CM

## 2020-04-01 DIAGNOSIS — N179 Acute kidney failure, unspecified: Secondary | ICD-10-CM | POA: Diagnosis not present

## 2020-04-01 DIAGNOSIS — R079 Chest pain, unspecified: Secondary | ICD-10-CM | POA: Diagnosis present

## 2020-04-01 DIAGNOSIS — R0789 Other chest pain: Secondary | ICD-10-CM | POA: Diagnosis not present

## 2020-04-01 DIAGNOSIS — N401 Enlarged prostate with lower urinary tract symptoms: Secondary | ICD-10-CM | POA: Diagnosis not present

## 2020-04-01 DIAGNOSIS — L7622 Postprocedural hemorrhage and hematoma of skin and subcutaneous tissue following other procedure: Secondary | ICD-10-CM | POA: Diagnosis not present

## 2020-04-01 DIAGNOSIS — R9431 Abnormal electrocardiogram [ECG] [EKG]: Secondary | ICD-10-CM | POA: Diagnosis not present

## 2020-04-01 HISTORY — DX: Obstruction of bile duct: K83.1

## 2020-04-01 HISTORY — DX: Obstruction of bile duct: K85.90

## 2020-04-01 LAB — CBC
HCT: 44.8 % (ref 39.0–52.0)
Hemoglobin: 14.5 g/dL (ref 13.0–17.0)
MCH: 30.5 pg (ref 26.0–34.0)
MCHC: 32.4 g/dL (ref 30.0–36.0)
MCV: 94.3 fL (ref 80.0–100.0)
Platelets: 317 10*3/uL (ref 150–400)
RBC: 4.75 MIL/uL (ref 4.22–5.81)
RDW: 13.6 % (ref 11.5–15.5)
WBC: 9.1 10*3/uL (ref 4.0–10.5)
nRBC: 0 % (ref 0.0–0.2)

## 2020-04-01 LAB — HEPATIC FUNCTION PANEL
ALT: 15 U/L (ref 0–44)
AST: 15 U/L (ref 15–41)
Albumin: 3.5 g/dL (ref 3.5–5.0)
Alkaline Phosphatase: 59 U/L (ref 38–126)
Bilirubin, Direct: 0.2 mg/dL (ref 0.0–0.2)
Indirect Bilirubin: 0.3 mg/dL (ref 0.3–0.9)
Total Bilirubin: 0.5 mg/dL (ref 0.3–1.2)
Total Protein: 6.2 g/dL — ABNORMAL LOW (ref 6.5–8.1)

## 2020-04-01 LAB — TROPONIN I (HIGH SENSITIVITY)
Troponin I (High Sensitivity): 24 ng/L — ABNORMAL HIGH (ref <18)
Troponin I (High Sensitivity): 19 ng/L — ABNORMAL HIGH (ref <18)
Troponin I (High Sensitivity): 24 ng/L — ABNORMAL HIGH (ref <18)
Troponin I (High Sensitivity): 27 ng/L — ABNORMAL HIGH (ref <18)

## 2020-04-01 LAB — BASIC METABOLIC PANEL
Anion gap: 11 (ref 5–15)
BUN: 17 mg/dL (ref 8–23)
CO2: 27 mmol/L (ref 22–32)
Calcium: 9.8 mg/dL (ref 8.9–10.3)
Chloride: 100 mmol/L (ref 98–111)
Creatinine, Ser: 1.09 mg/dL (ref 0.61–1.24)
GFR, Estimated: >60 mL/min (ref >60)
Glucose, Bld: 208 mg/dL — ABNORMAL HIGH (ref 70–99)
Potassium: 3.6 mmol/L (ref 3.5–5.1)
Sodium: 138 mmol/L (ref 135–145)

## 2020-04-01 LAB — AMYLASE: Amylase: 42 U/L (ref 28–100)

## 2020-04-01 LAB — CBG MONITORING, ED: Glucose-Capillary: 189 mg/dL — ABNORMAL HIGH (ref 70–99)

## 2020-04-01 LAB — RESP PANEL BY RT-PCR (FLU A&B, COVID) ARPGX2
Influenza A by PCR: NEGATIVE
Influenza B by PCR: NEGATIVE
SARS Coronavirus 2 by RT PCR: NEGATIVE

## 2020-04-01 LAB — GLUCOSE, CAPILLARY: Glucose-Capillary: 241 mg/dL — ABNORMAL HIGH (ref 70–99)

## 2020-04-01 LAB — LIPASE, BLOOD
Lipase: 40 U/L (ref 11–51)
Lipase: 40 U/L (ref 11–51)

## 2020-04-01 LAB — HEMOGLOBIN A1C
Hgb A1c MFr Bld: 8.6 % — ABNORMAL HIGH (ref 4.8–5.6)
Mean Plasma Glucose: 200.12 mg/dL

## 2020-04-01 LAB — D-DIMER, QUANTITATIVE: D-Dimer, Quant: 0.42 ug/mL-FEU (ref 0.00–0.50)

## 2020-04-01 MED ORDER — ASPIRIN EC 81 MG PO TBEC
81.0000 mg | DELAYED_RELEASE_TABLET | Freq: Every evening | ORAL | Status: DC
  Administered 2020-04-02 – 2020-04-05 (×4): 81 mg via ORAL
  Filled 2020-04-01 (×4): qty 1

## 2020-04-01 MED ORDER — GLIMEPIRIDE 2 MG PO TABS: 2.0000 mg | ORAL_TABLET | Freq: Every day | ORAL | Status: DC

## 2020-04-01 MED ORDER — TRAMADOL HCL 50 MG PO TABS
50.0000 mg | ORAL_TABLET | Freq: Two times a day (BID) | ORAL | Status: DC | PRN
  Administered 2020-04-02 – 2020-04-03 (×3): 50 mg via ORAL
  Filled 2020-04-01 (×3): qty 1

## 2020-04-01 MED ORDER — TAMSULOSIN HCL 0.4 MG PO CAPS
0.4000 mg | ORAL_CAPSULE | Freq: Every day | ORAL | Status: DC
  Administered 2020-04-02 – 2020-04-05 (×3): 0.4 mg via ORAL
  Filled 2020-04-01 (×4): qty 1

## 2020-04-01 MED ORDER — LISINOPRIL-HYDROCHLOROTHIAZIDE 20-25 MG PO TABS: 1.0000 | ORAL_TABLET | Freq: Every morning | ORAL | Status: DC

## 2020-04-01 MED ORDER — POTASSIUM CHLORIDE CRYS ER 20 MEQ PO TBCR
20.0000 meq | EXTENDED_RELEASE_TABLET | Freq: Every evening | ORAL | Status: DC
  Administered 2020-04-02: 20 meq via ORAL
  Filled 2020-04-01: qty 1

## 2020-04-01 MED ORDER — ATORVASTATIN CALCIUM 40 MG PO TABS
40.0000 mg | ORAL_TABLET | Freq: Every day | ORAL | Status: DC
  Administered 2020-04-02 – 2020-04-06 (×5): 40 mg via ORAL
  Filled 2020-04-01 (×5): qty 1

## 2020-04-01 MED ORDER — HYDROCHLOROTHIAZIDE 25 MG PO TABS: 25.0000 mg | ORAL_TABLET | Freq: Every day | ORAL | Status: DC

## 2020-04-01 MED ORDER — ACETAMINOPHEN 325 MG PO TABS
650.0000 mg | ORAL_TABLET | ORAL | Status: DC | PRN
  Administered 2020-04-02: 22:00:00 650 mg via ORAL
  Filled 2020-04-01: qty 2

## 2020-04-01 MED ORDER — CARVEDILOL 12.5 MG PO TABS
12.5000 mg | ORAL_TABLET | Freq: Two times a day (BID) | ORAL | Status: DC
  Administered 2020-04-01 – 2020-04-06 (×10): 12.5 mg via ORAL
  Filled 2020-04-01 (×10): qty 1

## 2020-04-01 MED ORDER — ONDANSETRON HCL 4 MG/2ML IJ SOLN: 4.0000 mg | Freq: Four times a day (QID) | INTRAMUSCULAR | Status: DC | PRN

## 2020-04-01 MED ORDER — LISINOPRIL 20 MG PO TABS: 20.0000 mg | ORAL_TABLET | Freq: Every day | ORAL | Status: DC

## 2020-04-01 MED ORDER — SODIUM CHLORIDE 0.9 % IV SOLN: INTRAVENOUS | Status: DC

## 2020-04-01 MED ORDER — HYDRALAZINE HCL 50 MG PO TABS
50.0000 mg | ORAL_TABLET | Freq: Three times a day (TID) | ORAL | Status: DC
  Administered 2020-04-01 – 2020-04-06 (×14): 50 mg via ORAL
  Filled 2020-04-01: qty 2
  Filled 2020-04-01 (×14): qty 1

## 2020-04-01 MED ORDER — HEPARIN SODIUM (PORCINE) 5000 UNIT/ML IJ SOLN
5000.0000 [IU] | Freq: Three times a day (TID) | INTRAMUSCULAR | Status: DC
  Administered 2020-04-01 – 2020-04-06 (×15): 5000 [IU] via SUBCUTANEOUS
  Filled 2020-04-01 (×15): qty 1

## 2020-04-01 MED ORDER — NITROGLYCERIN 0.4 MG SL SUBL
0.4000 mg | SUBLINGUAL_TABLET | SUBLINGUAL | Status: DC | PRN
  Administered 2020-04-01 (×2): 0.4 mg via SUBLINGUAL
  Filled 2020-04-01: qty 1

## 2020-04-01 MED ORDER — INSULIN GLARGINE 100 UNIT/ML ~~LOC~~ SOLN
6.0000 [IU] | Freq: Every day | SUBCUTANEOUS | Status: DC
  Administered 2020-04-01 – 2020-04-04 (×4): 6 [IU] via SUBCUTANEOUS
  Filled 2020-04-01 (×6): qty 0.06

## 2020-04-01 MED ORDER — INSULIN ASPART 100 UNIT/ML ~~LOC~~ SOLN
0.0000 [IU] | Freq: Three times a day (TID) | SUBCUTANEOUS | Status: DC
  Administered 2020-04-01: 3 [IU] via SUBCUTANEOUS
  Administered 2020-04-02: 11 [IU] via SUBCUTANEOUS
  Administered 2020-04-02 – 2020-04-03 (×4): 5 [IU] via SUBCUTANEOUS
  Administered 2020-04-03 – 2020-04-04 (×2): 8 [IU] via SUBCUTANEOUS
  Administered 2020-04-04: 3 [IU] via SUBCUTANEOUS
  Administered 2020-04-04: 15 [IU] via SUBCUTANEOUS
  Administered 2020-04-05: 12 [IU] via SUBCUTANEOUS

## 2020-04-01 MED ORDER — ALPRAZOLAM 0.25 MG PO TABS
0.2500 mg | ORAL_TABLET | Freq: Two times a day (BID) | ORAL | Status: DC | PRN
  Administered 2020-04-01 – 2020-04-04 (×3): 0.25 mg via ORAL
  Filled 2020-04-01 (×3): qty 1

## 2020-04-01 MED ORDER — AMLODIPINE BESYLATE 10 MG PO TABS
10.0000 mg | ORAL_TABLET | Freq: Every morning | ORAL | Status: DC
  Administered 2020-04-02 – 2020-04-06 (×5): 10 mg via ORAL
  Filled 2020-04-01 (×5): qty 1

## 2020-04-01 NOTE — ED Notes (Signed)
Iv bandgae and lt arm bandage changed iv site leaking some blood and the incision on his lt upper arm also leaking some clear bloody appearing drainage  Bulky bandage placed  .  There  Are some lt lateral blisters on the lt of the incision that are still intact

## 2020-04-01 NOTE — ED Notes (Signed)
Service Resource called @ 1803-[address] Ordered. 

## 2020-04-01 NOTE — Plan of Care (Signed)

## 2020-04-01 NOTE — ED Notes (Signed)
No chest pain

## 2020-04-01 NOTE — H&P (Signed)
History and Physical    [name] [id number]:[id number] DOB: 02-23-1934 DOA: 04/01/2020  PCP: Tonia Ghent, MD (Confirm with patient/family/NH records and if not entered, this has to be entered at [facility] point of entry) Patient coming from: home  I have personally briefly reviewed patient's old medical records in [place]  Chief Complaint: Chest pain/pressure with nausea  HPI: [name] is a 86 y.o. male with medical history significant of h/o CAD with last myoview 2014 read as low risk, DM, HTN, who reports having chest pressure at 0800 hrs with associated nausea and a feeling of being light-headed. No diaphoresis, tachycardia. He has multiple risk factors including advanced age, DM, HTN, obesity. He presents to [facility] for evaluation.    ED Course: T 98.1 147/82  HR 91  RR 26. Covid NEGATIVE. Cmet with glucose 208, Cr 1.09, CBCD nl, EKG with NSR, PACs no STEMI, old anterior injury. CXR NAD. D-dimer 0.42. EDP consulted with Dr. Debara Pickett for cardiology who recommended admission with cardiology to consult and determine appropriate risk stratification. [facility] called to admit patient.   Review of Systems: As per HPI otherwise 10 point review of systems negative. Patient does admit to epigastric abdominal pain, nausea and several epidsodes of emesis over the past several weeks.    Past Medical History:  Diagnosis Date  . Anxiety   . Borderline hyperlipidemia   . CAD (coronary artery disease)    pt. denies at preop  . Colon polyps   . Complication of anesthesia    trouble urinating after anesthesia  . Diverticulosis of colon   . DJD (degenerative joint disease)   . DM (diabetes mellitus) ([place])    Adult onset  type 2  . History of gastritis   . History of kidney stones    surgery to remove  . History of pyelonephritis   . History of syncope   . HTN (hypertension)   . Hypertrophy of prostate with urinary obstruction and other lower urinary tract symptoms (LUTS)   . Melanoma ([place])     back of neck  . Melanoma ([place]) 2022   per dermatology  . Myoclonus   . Pancreatitis due to biliary obstruction several years ago   treated by Dr. Delfin Edis    Past Surgical History:  Procedure Laterality Date  . APPENDECTOMY    . cartarized  nose blood vessel     left side  . CATARACT EXTRACTION     RIGHT EYE  . CHOLECYSTECTOMY    . cystoscopy  04/17/11   stent placed  . CYSTOSCOPY W/ URETERAL STENT PLACEMENT  04/17/2011   Procedure: CYSTOSCOPY WITH RETROGRADE PYELOGRAM/URETERAL STENT PLACEMENT;  Surgeon: Hanley Ben, MD;  Location: [facility];  Service: Urology;  Laterality: Left;  . CYSTOSCOPY WITH RETROGRADE PYELOGRAM, URETEROSCOPY AND STENT PLACEMENT Left 04/18/2013   Procedure: CYSTOSCOPY WITH LEFT RETROGRADE PYELOGRAM, LEFT URETEROSCOPY AND LASER LITHOTRIPSY LEFT STENT PLACEMENT;  Surgeon: Dutch Gray, MD;  Location: [facility];  Service: Urology;  Laterality: Left;  . HOLMIUM LASER APPLICATION Left 05/15/8183   Procedure: HOLMIUM LASER APPLICATION;  Surgeon: Dutch Gray, MD;  Location: [facility];  Service: Urology;  Laterality: Left;  . IRRIGATION AND DEBRIDEMENT ABSCESS Left 10/05/2014   Procedure: IAND D LEFT INDEX FINGER;  Surgeon: Dayna Barker, MD;  Location: [facility];  Service: Plastics;  Laterality: Left;  . KNEE SURGERY     bilateral ARTHROSCOPY X2 TO EACH KNEE  . MELANOMA EXCISION  2018  . MELANOMA EXCISION WITH  SENTINEL LYMPH NODE BIOPSY Left 03/29/2020   Procedure: WIDE LOCAL EXCISION, ADVANCED FLAP CLOSURE LEFT ARM MELANOMA DEFECT WITH SENTINEL LYMPH NODE MAPPING AND BIOPSY;  Surgeon: Stark Klein, MD;  Location: [place];  Service: General;  Laterality: Left;  . S/P cysto & stents for kidnedy stone 11/08 by Dr. Reece Agar    . 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 SHOULDER SURGERY  04/2005   left by Dr. Berenice Primas    Soc Hx - widowed x 40 years, wife died at age 10. He has 1 daughter - deceased from [facility] 2018/06/08,  1 son, 3 grand-daughters, 1 grandson, 1 great-grandson. He is a retired  Insurance account manager. He lives in his own home, his son lives with him. His sister lives nearby. He is I-ADLs.   reports that he has never smoked. He has never used smokeless tobacco. He reports that he does not drink alcohol and does not use drugs.  Allergies  Allergen Reactions  . Codeine Phosphate Nausea Only and Other (See Comments)    Can take with food and usually doesn't cause nausea  . Morphine Nausea Only and Other (See Comments)    Can take with food and usually doesn't cause nausea  . Metformin And Related Diarrhea    Family History  Problem Relation Age of Onset  . Heart attack Mother   . Dementia Brother   . Colon cancer Neg Hx   . Prostate cancer Neg Hx      Prior to Admission medications   Medication Sig Start Date End Date Taking? Authorizing Provider  Accu-Chek Softclix Lancets lancets TEST BLOOD SUGAR UP TO THREE TIMES DAILY Patient taking differently: 1 each by Other route in the morning, at noon, and at bedtime. 01/23/20  Yes Tonia Ghent, MD  amLODipine (NORVASC) 10 MG tablet Take 1 tablet (10 mg total) by mouth every morning. 03/07/20  Yes Tonia Ghent, MD  aspirin EC 81 MG tablet Take 81 mg by mouth every evening.   Yes [provider]  Blood Glucose Calibration (ACCU-CHEK AVIVA) SOLN Use as directed monthly. E11.59  Insulin dependent Patient taking differently: 1 each by Other route every 30 (thirty) days. Use as directed monthly. E11.59  Insulin dependent 12/06/18  Yes Tonia Ghent, MD  Blood Glucose Monitoring Suppl (ACCU-CHEK AVIVA PLUS) w/Device KIT Check sugar up to 3 times daily. E11.59  Insulin dependent 11/30/18  Yes Tonia Ghent, MD  carvedilol (COREG) 12.5 MG tablet TAKE 1 TABLET TWICE DAILY WITH MEALS Patient taking differently: Take 12.5 mg by mouth 2 (two) times daily with a meal. 03/07/20  Yes Tonia Ghent, MD  COD LIVER OIL PO Take 1 capsule by mouth 2  (two) times daily.    Yes [provider]  DROPLET PEN NEEDLES 30G X 8 MM MISC USE  WITH  INSULIN  PEN Patient taking differently: Inject 1 packet into the skin as needed (use with insulin pen). 02/03/19  Yes Tonia Ghent, MD  glimepiride (AMARYL) 4 MG tablet Take 1/2 tablet every day with breakfast Patient taking differently: Take 2 mg by mouth daily with breakfast. 03/07/20  Yes Tonia Ghent, MD  glucose blood (ONETOUCH ULTRA) test strip Use as instructed Patient taking differently: 1 each by Other route as directed. 03/22/20  Yes Tonia Ghent, MD  hydrALAZINE (APRESOLINE) 50 MG tablet Take 1 tablet (50 mg total) by mouth 3 (three) times daily.  03/07/20  Yes Tonia Ghent, MD  insulin glargine, 1 Unit Dial, ([name] SOLOSTAR) 300 UNIT/ML Solostar Pen Inject 12 Units into the skin at bedtime. Patient taking differently: Inject 4-8 Units into the skin at bedtime. 03/27/20  Yes Tonia Ghent, MD  insulin lispro (HUMALOG KWIKPEN) 100 UNIT/ML KwikPen Inject 0.04-0.1 mLs (4-10 Units total) into the skin 3 (three) times daily. INJECT INTO SKIN 3 TIMES DAILY BEFORE MEALS USING SLIDING SCALE. 151 TO 200 = 4 UNITS, 201 TO 250= 6 UNITS, 251 TO 300= 8 UNITS, > 300 = 10 units Patient taking differently: Inject 4-10 Units into the skin See admin instructions. INJECT INTO SKIN 3 TIMES DAILY BEFORE MEALS USING SLIDING SCALE. 151 TO 200 = 4 UNITS, 201 TO 250= 6 UNITS, 251 TO 300= 8 UNITS, > 300 = 10 units 10/29/18  Yes Tonia Ghent, MD  lisinopril-hydrochlorothiazide (ZESTORETIC) 20-25 MG tablet Take 1 tablet by mouth every morning. 03/07/20  Yes Tonia Ghent, MD  oxyCODONE (OXY IR/ROXICODONE) 5 MG immediate release tablet Take 5 mg by mouth every 4 (four) hours as needed for severe pain. 03/29/20  Yes [provider]  potassium chloride SA (KLOR-CON M20) 20 MEQ tablet Take 1 tablet (20 mEq total) by mouth daily. Patient taking differently: Take 20 mEq by mouth every evening.  03/09/20  Yes Tonia Ghent, MD  SYRINGE-NEEDLE, DISP, 3 ML (B-D 3CC LUER-LOK SYR 25GX1") 25G X 1" 3 ML MISC USE TO INJECT VITAMIN B12 MONTHLY. Patient taking differently: 1 each by Other route See admin instructions. USE TO INJECT VITAMIN B12 MONTHLY. 08/22/19  Yes Tonia Ghent, MD  tamsulosin (FLOMAX) 0.4 MG CAPS capsule TAKE 1 CAPSULE EVERY DAY  AFTER  SUPPER Patient taking differently: Take 0.4 mg by mouth daily after supper. 03/07/20  Yes Tonia Ghent, MD  traMADol (ULTRAM) 50 MG tablet TAKE 1 TABLET BY MOUTH EVERY 12 HOURS AS NEEDED FOR KNEE PAIN. Patient taking differently: Take 50 mg by mouth every 12 (twelve) hours as needed for moderate pain. 02/14/20  Yes Tonia Ghent, MD  cyanocobalamin (,VITAMIN B-12,) 1000 MCG/ML injection Inject 1 mL (1,000 mcg total) into the muscle every 30 (thirty) days. Dispense 3 of the 1 mL vials. Patient not taking: Reported on 04/01/2020 10/29/18   Tonia Ghent, MD  meclizine (ANTIVERT) 25 MG tablet Take 0.5-1 tablets (12.5-25 mg total) by mouth 2 (two) times daily as needed for dizziness. Patient not taking: Reported on 04/01/2020 08/05/16   Tonia Ghent, MD  traMADol (ULTRAM) 50 MG tablet Take 1 tablet (50 mg total) by mouth every 6 (six) hours as needed for moderate pain or severe pain. Patient not taking: Reported on 04/01/2020 03/29/20   Stark Klein, MD    Physical Exam: Vitals:   04/01/20 1230 04/01/20 1245 04/01/20 1315 04/01/20 1330  BP: (!) 176/73 (!) 187/81 (!) 149/99 (!) 147/82  Pulse: 83 78 81 91  Resp:   18 (!) 26  Temp:      TempSrc:      SpO2: 99% 97% 98% 97%     Vitals:   04/01/20 1230 04/01/20 1245 04/01/20 1315 04/01/20 1330  BP: (!) 176/73 (!) 187/81 (!) 149/99 (!) 147/82  Pulse: 83 78 81 91  Resp:   18 (!) 26  Temp:      TempSrc:      SpO2: 99% 97% 98% 97%   General: heavy-set man in no acute distress. Eyes: PERRL, lids and conjunctivae normal ENMT: Mucous  membranes are moist. Posterior pharynx clear of  any exudate or lesions.Normal dentition.  Neck: normal, supple, no masses, no thyromegaly Respiratory: clear to auscultation bilaterally, no wheezing, no crackles. Normal respiratory effort. No accessory muscle use.  Cardiovascular: Regular rate and rhythm, no murmurs / rubs / gallops. No extremity edema. trace pedal pulses. No carotid bruits.  Abdomen:  Tenderness to light palpation across the epigastrum with mild guarding, no masses palpated. No hepatosplenomegaly. Bowel sounds hypoactive.  Musculoskeletal: no clubbing / cyanosis. No joint deformity upper and lower extremities. Good ROM, no contractures. Normal muscle tone.  Skin: Surgical wound proximal Left UE with induration/swelling, no dehesicence but serosanguinous drainage. no rashes, lesions, ulcers. Multiple dark macular skin lesions back.  Neurologic: CN 2-12 grossly intact but HOH. Sensation intact.Strength 5/5 in all 4.  Psychiatric: Normal judgment and insight. Alert and oriented x 3. Normal mood. Mild memory impairment.    Labs on Admission: I have personally reviewed following labs and imaging studies  CBC: Recent Labs  Lab 03/29/20 0604 04/01/20 1058  WBC 8.1 9.1  HGB 15.2 14.5  HCT 44.2 44.8  MCV 93.1 94.3  PLT 287 025   Basic Metabolic Panel: Recent Labs  Lab 03/29/20 0604 04/01/20 1058  NA 140 138  K 3.4* 3.6  CL 104 100  CO2 23 27  GLUCOSE 228* 208*  BUN 25* 17  CREATININE 1.18 1.09  CALCIUM 9.7 9.8   GFR: Estimated Creatinine Clearance: 64.5 mL/min (by C-G formula based on SCr of 1.09 mg/dL). Liver Function Tests: Recent Labs  Lab 04/01/20 1155  AST 15  ALT 15  ALKPHOS 59  BILITOT 0.5  PROT 6.2*  ALBUMIN 3.5   Recent Labs  Lab 04/01/20 1155  LIPASE 40   No results for input(s): AMMONIA in the last 168 hours. Coagulation Profile: No results for input(s): INR, PROTIME in the last 168 hours. Cardiac Enzymes: No results for input(s): CKTOTAL, CKMB, CKMBINDEX, TROPONINI in the last 168  hours. BNP (last 3 results) No results for input(s): PROBNP in the last 8760 hours. HbA1C: No results for input(s): HGBA1C in the last 72 hours. CBG: Recent Labs  Lab 03/29/20 0604 03/29/20 0958 03/29/20 1116  GLUCAP 231* 274* 231*   Lipid Profile: No results for input(s): CHOL, HDL, LDLCALC, TRIG, CHOLHDL, LDLDIRECT in the last 72 hours. Thyroid Function Tests: No results for input(s): TSH, T4TOTAL, FREET4, T3FREE, THYROIDAB in the last 72 hours. Anemia Panel: No results for input(s): VITAMINB12, FOLATE, FERRITIN, TIBC, IRON, RETICCTPCT in the last 72 hours. Urine analysis:    Component Value Date/Time   COLORURINE YELLOW 03/16/2018 1158   APPEARANCEUR CLEAR 03/16/2018 1158   LABSPEC 1.018 03/16/2018 1158   PHURINE 5.0 03/16/2018 1158   GLUCOSEU NEGATIVE 03/16/2018 1158   HGBUR NEGATIVE 03/16/2018 1158   BILIRUBINUR NEGATIVE 03/16/2018 1158   KETONESUR NEGATIVE 03/16/2018 1158   PROTEINUR NEGATIVE 03/16/2018 1158   UROBILINOGEN 0.2 02/02/2014 1509   NITRITE NEGATIVE 03/16/2018 1158   LEUKOCYTESUR SMALL (A) 03/16/2018 1158    Radiological Exams on Admission: DG Chest 2 View  Result Date: 04/01/2020 CLINICAL DATA:  86 year old male with a history of shortness of breath EXAM: CHEST - 2 VIEW COMPARISON:  03/16/2020 FINDINGS: Cardiomediastinal silhouette unchanged in size and contour. No central vascular congestion. No interlobular septal thickening. No pneumothorax. No pleural effusion. No confluent airspace disease. Degenerative changes of the spine.  No displaced fracture IMPRESSION: Chronic lung changes without evidence of acute cardiopulmonary disease. Electronically Signed   By: York Cerise  Earleen Newport D.O.   On: 04/01/2020 10:53    EKG: Independently reviewed. NSR, PACs, old anterior injury, no acute changes/ STEMI  Assessment/Plan Active Problems:   Epigastric abdominal pain   Essential hypertension   CAD (coronary artery disease)   BPH with obstruction/lower urinary tract  symptoms   HLD (hyperlipidemia)   Type 2 diabetes mellitus with vascular disease (HCC)   Chest pain    1. Chest pain/CAD - patient with report of chest pain this AM described as a heaviness in his central chest. He also had nausea but no diaphoresis, no radiation to his arm (caveat - surgical wound proximal LUE). Initial troponins 24 - 19. No acute EKG changes. He has multiple risk factors: advanced age, DM, HTN, h/o CAD. Last Nuc/med/myoview 2014 read as low risk. EHCO Dec '18 - EF 45-50% with grade I diastolic dysfxn. No SOB, no hypoxemia Plan Cardiac tele admit  Cyclic Troponins two more times  Continue home meds - including CCB, Coreg, ACE  Cardiology consult re: risk stratification  2. Abdominal pain - patient with h/o cholecystectomy, h/o biliary duct obstruction, h/o pancreatitis adressed by Dr. Delfin Edis. Now with epigastric pain, nausea. Plan Amylase, Lipase  If labs positive - consider abdominal U/S  May need GI consult  3. HLD - Not on antilipdemics. Plan Lipid profile  Start lipitor 40 mg daily  4. DM - last A1C 8.1% in Nv '21. Plan Hold glimeperide while inpatient  Continue Lantus at 6 u qHS  Sliding scale coverage  5. HTN - continue home meds  6. BPH with BOO - continue home meds.  7. Code status - patient clearly states he would not want Cardiac resuscitation in the event of cardiac arrest. Sister confirms.    DVT prophylaxis: heparin SQ  Code Status: DNR  Family Communication: Sister present during exam - she will relay information to family  Disposition Plan: home when medically stable  Consults called: Cardiology - Dr/ Debara Pickett; GS - text to [place] on call  Admission status: obs     Adella Hare MD [facility] Pager [phone]  If 7PM-7AM, please contact night-coverage [email or web address] Password [facility]  04/01/2020, 2:57 PM

## 2020-04-01 NOTE — ED Notes (Signed)
Report given to [facility] on 2c

## 2020-04-01 NOTE — Consult Note (Addendum)
Cardiology Consultation:   Patient ID: [name] MRN: [id number]; DOB: Jun 07, 1934  Admit date: 04/01/2020 Date of Consult: 04/01/2020  PCP:  [name] Ghent, MD   [name]  Cardiologist:  [name] Furbish, MD  Advanced Practice Provider:  No care team member to display Electrophysiologist:  None        Patient Profile:   [name] is a 86 y.o. male with a hx of startle myoclonus, prior cath normal 2004, neg nuc 2014. HTN, DM-2, hx of NICM EF 35-40% in 2014 and left arm melanoma 03/29/20  who is being seen today for the evaluation of chest pain at the request of [name].  History of Present Illness:   [name] with above hx of remote cardiac cath in 2004 and non obstructive disease, neg nuc 2014, HTN, DM-2 and hx NICM EF 45-50% on the echo in 2015. Had been doing well with surgery 03/29/20 for cT2aN0 left posterior upper arm melanoma. He had some bleeding post op and returned for eval.     Today he developed mid sternal chest pain since 0800 with SOB and some nausea.  Took ASA prior to EMS arrival.  Initially was lt arm pain that felt tight so he removed some of bandage on arm.  He did go back to sleep then woke with mid sternal chest pain/pressure along with nausea and lightheadedness and SOB.       EKG:  The EKG was personally reviewed and demonstrates:  SR with freq PACs and no acute ST changes from 03/29/20 Telemetry:  Telemetry was personally reviewed and demonstrates:  SR  Na 138, K+ 3.6 Cr 1.09 LFTs WNL Hs troponin 24, 19  hgb 14.5 WBC 9.1 plts 317 ddimer 0.42 Negative covid   2V CXR  IMPRESSION: Chronic lung changes without evidence of acute cardiopulmonary disease  BP elevated on arrival 162/93, now 147/82 p 88, afebrile R 25  Past Medical History:  Diagnosis Date  . Anxiety   . Borderline hyperlipidemia   . CAD (coronary artery disease)    pt. denies at preop  . Colon polyps   . Complication of anesthesia    trouble  urinating after anesthesia  . Diverticulosis of colon   . DJD (degenerative joint disease)   . DM (diabetes mellitus) ([place])    Adult onset  type 2  . History of gastritis   . History of kidney stones    surgery to remove  . History of pyelonephritis   . History of syncope   . HTN (hypertension)   . Hypertrophy of prostate with urinary obstruction and other lower urinary tract symptoms (LUTS)   . Melanoma ([place])    back of neck  . Melanoma ([place]) 2022   per dermatology  . Myoclonus     Past Surgical History:  Procedure Laterality Date  . APPENDECTOMY    . cartarized  nose blood vessel     left side  . CATARACT EXTRACTION     RIGHT EYE  . CHOLECYSTECTOMY    . cystoscopy  04/17/11   stent placed  . CYSTOSCOPY W/ URETERAL STENT PLACEMENT  04/17/2011   Procedure: CYSTOSCOPY WITH RETROGRADE PYELOGRAM/URETERAL STENT PLACEMENT;  Surgeon: [name] Ben, MD;  Location: [facility];  Service: Urology;  Laterality: Left;  . CYSTOSCOPY WITH RETROGRADE PYELOGRAM, URETEROSCOPY AND STENT PLACEMENT Left 04/18/2013   Procedure: CYSTOSCOPY WITH LEFT RETROGRADE PYELOGRAM, LEFT URETEROSCOPY AND LASER LITHOTRIPSY LEFT STENT PLACEMENT;  Surgeon: [name] Gray, MD;  Location:  [facility];  Service: Urology;  Laterality: Left;  . HOLMIUM LASER APPLICATION Left 0/02/270   Procedure: HOLMIUM LASER APPLICATION;  Surgeon: [name] Gray, MD;  Location: [facility];  Service: Urology;  Laterality: Left;  . IRRIGATION AND DEBRIDEMENT ABSCESS Left 10/05/2014   Procedure: IAND D LEFT INDEX FINGER;  Surgeon: [name] Barker, MD;  Location: [facility];  Service: Plastics;  Laterality: Left;  . KNEE SURGERY     bilateral ARTHROSCOPY X2 TO EACH KNEE  . MELANOMA EXCISION  2018  . MELANOMA EXCISION WITH SENTINEL LYMPH NODE BIOPSY Left 03/29/2020   Procedure: WIDE LOCAL EXCISION, ADVANCED FLAP CLOSURE LEFT ARM MELANOMA DEFECT WITH SENTINEL LYMPH NODE MAPPING AND BIOPSY;  Surgeon: [name] Klein, MD;  Location: [facility];  Service: General;  Laterality:  Left;  . S/P cysto & stents for kidnedy stone 11/08 by Dr. Reece Mathews    . 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 SHOULDER SURGERY  04/2005   left by Dr. Berenice Mathews     Home Medications:  Prior to Admission medications   Medication Sig Start Date End Date Taking? Authorizing Provider  Accu-Chek Softclix Lancets lancets TEST BLOOD SUGAR UP TO THREE TIMES DAILY Patient taking differently: 1 each by Other route in the morning, at noon, and at bedtime. 01/23/20  Yes [name] Ghent, MD  amLODipine (NORVASC) 10 MG tablet Take 1 tablet (10 mg total) by mouth every morning. 03/07/20  Yes [name] Ghent, MD  aspirin EC 81 MG tablet Take 81 mg by mouth every evening.   Yes [provider]  Blood Glucose Calibration (ACCU-CHEK AVIVA) SOLN Use as directed monthly. E11.59  Insulin dependent Patient taking differently: 1 each by Other route every 30 (thirty) days. Use as directed monthly. E11.59  Insulin dependent 12/06/18  Yes [name] Ghent, MD  Blood Glucose Monitoring Suppl (ACCU-CHEK AVIVA PLUS) w/Device KIT Check sugar up to 3 times daily. E11.59  Insulin dependent 11/30/18  Yes [name] Ghent, MD  carvedilol (COREG) 12.5 MG tablet TAKE 1 TABLET TWICE DAILY WITH MEALS Patient taking differently: Take 12.5 mg by mouth 2 (two) times daily with a meal. 03/07/20  Yes [name] Ghent, MD  COD LIVER OIL PO Take 1 capsule by mouth 2 (two) times daily.    Yes [provider]  DROPLET PEN NEEDLES 30G X 8 MM MISC USE  WITH  INSULIN  PEN Patient taking differently: Inject 1 packet into the skin as needed (use with insulin pen). 02/03/19  Yes [name] Ghent, MD  glimepiride (AMARYL) 4 MG tablet Take 1/2 tablet every day with breakfast Patient taking differently: Take 2 mg by mouth daily with breakfast. 03/07/20  Yes [name] Ghent, MD  glucose blood (ONETOUCH ULTRA) test strip Use as instructed Patient taking differently: 1  each by Other route as directed. 03/22/20  Yes [name] Ghent, MD  hydrALAZINE (APRESOLINE) 50 MG tablet Take 1 tablet (50 mg total) by mouth 3 (three) times daily. 03/07/20  Yes [name] Ghent, MD  insulin glargine, 1 Unit Dial, ([name] SOLOSTAR) 300 UNIT/ML Solostar Pen Inject 12 Units into the skin at bedtime. Patient taking differently: Inject 4-8 Units into the skin at bedtime. 03/27/20  Yes [name] Ghent, MD  insulin lispro (HUMALOG KWIKPEN) 100 UNIT/ML KwikPen Inject 0.04-0.1 mLs (4-10 Units total) into the skin 3 (three) times daily. INJECT INTO SKIN 3 TIMES DAILY BEFORE MEALS USING SLIDING SCALE. 151 TO 200 =  4 UNITS, 201 TO 250= 6 UNITS, 251 TO 300= 8 UNITS, > 300 = 10 units Patient taking differently: Inject 4-10 Units into the skin See admin instructions. INJECT INTO SKIN 3 TIMES DAILY BEFORE MEALS USING SLIDING SCALE. 151 TO 200 = 4 UNITS, 201 TO 250= 6 UNITS, 251 TO 300= 8 UNITS, > 300 = 10 units 10/29/18  Yes [name] Ghent, MD  lisinopril-hydrochlorothiazide (ZESTORETIC) 20-25 MG tablet Take 1 tablet by mouth every morning. 03/07/20  Yes [name] Ghent, MD  oxyCODONE (OXY IR/ROXICODONE) 5 MG immediate release tablet Take 5 mg by mouth every 4 (four) hours as needed for severe pain. 03/29/20  Yes [provider]  potassium chloride SA (KLOR-CON M20) 20 MEQ tablet Take 1 tablet (20 mEq total) by mouth daily. Patient taking differently: Take 20 mEq by mouth every evening. 03/09/20  Yes [name] Ghent, MD  SYRINGE-NEEDLE, DISP, 3 ML (B-D 3CC LUER-LOK SYR 25GX1") 25G X 1" 3 ML MISC USE TO INJECT VITAMIN B12 MONTHLY. Patient taking differently: 1 each by Other route See admin instructions. USE TO INJECT VITAMIN B12 MONTHLY. 08/22/19  Yes [name] Ghent, MD  tamsulosin (FLOMAX) 0.4 MG CAPS capsule TAKE 1 CAPSULE EVERY DAY  AFTER  SUPPER Patient taking differently: Take 0.4 mg by mouth daily after supper. 03/07/20  Yes [name] Ghent, MD  traMADol (ULTRAM) 50 MG tablet TAKE 1  TABLET BY MOUTH EVERY 12 HOURS AS NEEDED FOR KNEE PAIN. Patient taking differently: Take 50 mg by mouth every 12 (twelve) hours as needed for moderate pain. 02/14/20  Yes [name] Ghent, MD  cyanocobalamin (,VITAMIN B-12,) 1000 MCG/ML injection Inject 1 mL (1,000 mcg total) into the muscle every 30 (thirty) days. Dispense 3 of the 1 mL vials. Patient not taking: Reported on 04/01/2020 10/29/18   [name] Ghent, MD  meclizine (ANTIVERT) 25 MG tablet Take 0.5-1 tablets (12.5-25 mg total) by mouth 2 (two) times daily as needed for dizziness. Patient not taking: Reported on 04/01/2020 08/05/16   [name] Ghent, MD  traMADol (ULTRAM) 50 MG tablet Take 1 tablet (50 mg total) by mouth every 6 (six) hours as needed for moderate pain or severe pain. Patient not taking: Reported on 04/01/2020 03/29/20   [name] Klein, MD    Inpatient Medications: Scheduled Meds: . [START ON 04/02/2020] amLODipine  10 mg Oral q morning  . [START ON 04/02/2020] aspirin EC  81 mg Oral QPM  . carvedilol  12.5 mg Oral BID WC  . [START ON 04/02/2020] glimepiride  2 mg Oral Q breakfast  . heparin  5,000 Units Subcutaneous Q8H  . hydrALAZINE  50 mg Oral TID  . [START ON 04/02/2020] lisinopril  20 mg Oral Daily   And  . [START ON 04/02/2020] hydrochlorothiazide  25 mg Oral Daily  . insulin aspart  0-15 Units Subcutaneous TID WC  . insulin glargine  6 Units Subcutaneous QHS  . [START ON 04/02/2020] potassium chloride SA  20 mEq Oral QPM  . tamsulosin  0.4 mg Oral QPC supper   Continuous Infusions: . sodium chloride     PRN Meds: acetaminophen, ALPRAZolam, nitroGLYCERIN, ondansetron (ZOFRAN) IV, traMADol  Allergies:    Allergies  Allergen Reactions  . Codeine Phosphate Nausea Only and Other (See Comments)    Can take with food and usually doesn't cause nausea  . Morphine Nausea Only and Other (See Comments)    Can take with food and usually doesn't cause nausea  . Metformin And Related Diarrhea  Social History:    Social History   Socioeconomic History  . Marital status: Widowed    Spouse name: Not on file  . Number of children: 2  . Years of education: Not on file  . Highest education level: Not on file  Occupational History  . Occupation: Retired  Tobacco Use  . Smoking status: Never Smoker  . Smokeless tobacco: Never Used  Vaping Use  . Vaping Use: Never used  Substance and Sexual Activity  . Alcohol use: No    Alcohol/week: 0.0 standard drinks  . Drug use: No  . Sexual activity: Not Currently  Other Topics Concern  . Not on file  Social History Narrative   Widowed 04/21/2004   Initially 2 kids- 1 son still alive as of April 21, 2018.  Daughter died 2018/06/20 in coronavirus pandemic.  Son lives with patient.    Enjoys hunting and fishing   Retired from [employer] and [place] work   Investment banker, operational of Radio broadcast assistant Strain: Not on [employer] Insecurity: Not on file  Transportation Needs: Not on file  Physical Activity: Not on file  Stress: Not on file  Social Connections: Not on file  Intimate Partner Violence: Not on file    Family History:    Family History  Problem Relation Age of Onset  . Heart attack Mother   . Dementia Brother   . Colon cancer Neg Hx   . Prostate cancer Neg Hx      ROS:  Please see the history of present illness.  General:no colds or fevers, no weight changes Skin:no rashes or ulcers HEENT:no blurred vision, no congestion CV:see HPI PUL:see HPI GI:no diarrhea constipation or melena, no indigestion GU:no hematuria, no dysuria MS:no joint pain, no claudication Neuro:no syncope but hx of syncope,some lightheadedness Endo:+ diabetes, no thyroid disease  All other ROS reviewed and negative.     Physical Exam/Data:   Vitals:   04/01/20 1230 04/01/20 1245 04/01/20 1315 04/01/20 1330  BP: (!) 176/73 (!) 187/81 (!) 149/99 (!) 147/82  Pulse: 83 78 81 91  Resp:   18 (!) 26  Temp:      TempSrc:      SpO2: 99% 97% 98% 97%   No intake or output  data in the 24 hours ending 04/01/20 1422 Last 3 Weights 03/29/2020 03/28/2020 01/03/2020  Weight (lbs) 233 lb 233 lb 233 lb 14.4 oz  Weight (kg) 105.688 kg 105.688 kg 106.096 kg     There is no height or weight on file to calculate BMI.  EXAM per Dr. Meda Coffee   Relevant CV Studies: Echo 04-21-13 Study Conclusions   - Left ventricle: The cavity size was mildly dilated. Wall  thickness was increased in a pattern of mild LVH. Systolic  function was mildly reduced. The estimated ejection fraction was  in the range of 45% to 50%. Doppler parameters are consistent  with abnormal left ventricular relaxation (grade 1 diastolic  dysfunction).  - Left atrium: The atrium was mildly dilated.  - Right ventricle: The cavity size was mildly dilated.  - Right atrium: The atrium was mildly dilated.  - Pulmonary arteries: PA peak pressure: 32 mm Hg (S).     Laboratory Data:  High Sensitivity Troponin:   Recent Labs  Lab 04/01/20 1058 04/01/20 1155  TROPONINIHS 24* 19*     Chemistry Recent Labs  Lab 03/29/20 0604 04/01/20 1058  NA 140 138  K 3.4* 3.6  CL 104 100  CO2 23 27  GLUCOSE 228* 208*  BUN 25* 17  CREATININE 1.18 1.09  CALCIUM 9.7 9.8  GFRNONAA >60 >60  ANIONGAP 13 11    Recent Labs  Lab 04/01/20 1155  PROT 6.2*  ALBUMIN 3.5  AST 15  ALT 15  ALKPHOS 59  BILITOT 0.5   Hematology Recent Labs  Lab 03/29/20 0604 04/01/20 1058  WBC 8.1 9.1  RBC 4.75 4.75  HGB 15.2 14.5  HCT 44.2 44.8  MCV 93.1 94.3  MCH 32.0 30.5  MCHC 34.4 32.4  RDW 13.7 13.6  PLT 287 317   BNPNo results for input(s): BNP, PROBNP in the last 168 hours.  DDimer  Recent Labs  Lab 04/01/20 1155  DDIMER 0.42     Radiology/Studies:  DG Chest 2 View  Result Date: 04/01/2020 CLINICAL DATA:  86 year old male with a history of shortness of breath EXAM: CHEST - 2 VIEW COMPARISON:  03/16/2020 FINDINGS: Cardiomediastinal silhouette unchanged in size and contour. No central vascular  congestion. No interlobular septal thickening. No pneumothorax. No pleural effusion. No confluent airspace disease. Degenerative changes of the spine.  No displaced fracture IMPRESSION: Chronic lung changes without evidence of acute cardiopulmonary disease. Electronically Signed   By: Corrie Mckusick D.O.   On: 04/01/2020 10:53   NM Sentinel Node Inj-No Rpt (Melanoma)  Result Date: 03/29/2020 Sulfur colloid was injected by the nuclear medicine technologist for melanoma sentinel node.   Assessment and Plan:   1. Chest pain present when he woke this AM. Some SOB and nausea, troponins tr elevated, EKG without acute changes.  Remote cath in 2004 with non obstructive to patent Coronary arteries, I did not find report.  Dr. Meda Coffee to see, possible keep overnight and stress test vs cardiac CTA  continue ASA 2. Hx of NICM with EF in 2015 45-50%, check echo  3. Recent surgery Lt arm 4. HTN mildly elevated on arrival improved on apresaline as outpt and amlodipine.  5. DM-2 on insulin  6. HLD check lipids. Ordered   Risk Assessment/Risk Scores:     HEAR Score (for undifferentiated chest pain):  HEAR Score: 6   For questions or updates, please contact [facility] Please consult [email or web address] for contact info under   Signed, Cecilie Kicks, NP  04/01/2020 2:22 PM  The patient was seen, examined and discussed with Cecilie Kicks, NP and I agree with the above.   86 y.o. male with a hx of nonobstructive CAD seen on prior cath normal 2004, neg nuc 2014. HTN, DM-2, hx of NICM EF 45-50 % in 2014, he was previously followed by Dr. Verl Blalock but has not been seen since he retired in 2014.  The patient underwent surgical resection of left arm melanoma including biopsy of his lymph nodes in his left axilla on 03/29/2020.  The patient states that he has not been feeling well since that surgery, he has developed nausea and vomiting inability to eat, he believes that eats secondary to narcotics but symptoms continued even  after he stopped using them.  He was able to eat with ongoing nausea, eventually today he develop left epigastric/lower chest pain and also reports significant bruising and swelling and bruising in his left arm at the site of his surgery. He denies any fever or chills.  No orthopnea proximal nocturnal dyspnea.  He has significant knee pains and is not very active but able to perform all activities of daily living and has not had chest pain in many years until today.  On physical exam he is  not in acute distress, he has no JVDs, his lungs are clear, his lower extremities have no swelling, they are warm and have good pulses.  His left upper extremity has significant bruising swelling and oozing from the surgical wound and appears infected, the site of lymph node biopsy appears erythematous but not infected and is not losing anything.  His chest pain resolved on arrival with aspirin and nitroglycerin.  Labs show normal electrolytes, creatinine 1.09 with GFR over 60, hemoglobin 14.5, platelets 317, troponin 24 and 19.  ECG shows sinus rhythm with very frequent PACs, no acute ST-T wave abnormalities. Telemetry shows sinus rhythm with frequent atrial ectopy.  Chest x-ray was reviewed and does not show signs of CHF.  The patient was very hypertensive on arrival with blood pressure up to 190/100.  Assessment and plan  Atypical chest pain in the settings of prolonged nausea vomiting and possible sepsis secondary to infected wound post melanoma resection.  Minimal troponin elevation with flat trend. I would focus on treatment of underlying disease per primary team, His blood pressure is significantly elevated I agree with starting amlodipine 10 mg daily, carvedilol 12.5 mg p.o. twice daily, lisinopril 20 mg daily, I would hold hydrochlorothiazide in the settings of dehydration and possible sepsis.  We will uptitrate as needed.  I would not proceed with ischemic work-up at this point, we will obtain an  echocardiogram, and unless he has new significant wall motion abnormalities we will follow as outpatient and if he continues to have symptoms consider ischemic work-up at that point.  Ena Dawley, MD 04/01/2020

## 2020-04-01 NOTE — ED Triage Notes (Signed)
Pt to triage via [facility] from home.  Reports mid-sternal Chest pain since 8am with SOB with exertion and nausea.  18g RFA.  Took ASA prior to EMS arrival.

## 2020-04-01 NOTE — ED Provider Notes (Signed)
Emergency Department Provider Note   I have reviewed the triage vital signs and the nursing notes.   HISTORY  Chief Complaint Chest Pain   HPI [name] is a 86 y.o. male with past medical history reviewed below including recent wide local excision of left upper arm melanoma on 2/17 presents to the emergency department with chest discomfort which began this morning.  Patient states he initially woke up with some pain in the left arm near the area of his bandage from his recent surgery.  He stated felt very tight and so he cut the bandage loose with scissors.  He went back to sleep and woke up this morning with midsternal chest pain/pressure along with some nausea, lightheadedness, and shortness of breath.  No pleuritic pain.  No similar pain like this in the past.  Symptoms did not resolve and he ultimately called EMS.  They gave aspirin in route.  Patient continues to have some discomfort.  No clear provoking or modifying factors.  Patient did have some bleeding complications after his recent procedure but those have not returned.    Past Medical History:  Diagnosis Date  . Anxiety   . Borderline hyperlipidemia   . CAD (coronary artery disease)    pt. denies at preop  . Colon polyps   . Complication of anesthesia    trouble urinating after anesthesia  . Diverticulosis of colon   . DJD (degenerative joint disease)   . DM (diabetes mellitus) ([place])    Adult onset  type 2  . History of gastritis   . History of kidney stones    surgery to remove  . History of pyelonephritis   . History of syncope   . HTN (hypertension)   . Hypertrophy of prostate with urinary obstruction and other lower urinary tract symptoms (LUTS)   . Melanoma ([place])    back of neck  . Melanoma ([place]) 2022   per dermatology  . Myoclonus   . Pancreatitis due to biliary obstruction several years ago   treated by Dr. Delfin Edis    Patient Active Problem List   Diagnosis Date Noted  . Epigastric  abdominal pain 04/01/2020  . Melanoma ([place]) 2022  . Medicare annual wellness visit, subsequent 01/03/2019  . Recurrent Epistaxis 03/02/2017  . Vitamin B 12 deficiency 08/17/2015  . Elevated TSH 10/06/2014  . Unsteady gait 09/18/2014  . Midline low back pain without sciatica 09/18/2014  . Chronic combined systolic and diastolic congestive heart failure ([place]) 01/27/2014  . History of coronary artery disease   . Chest pain 12/15/2013  . Healthcare maintenance 11/10/2013  . Advance care planning 11/10/2013  . ED (erectile dysfunction) 06/24/2013  . HLD (hyperlipidemia) 03/22/2012  . Type 2 diabetes mellitus with vascular disease ([place]) 03/22/2012  . Thyroid cyst 03/19/2011  . BPH with obstruction/lower urinary tract symptoms 03/15/2010  . GASTRITIS 02/21/2007  . Anxiety state 02/15/2007  . CAD (coronary artery disease) 02/15/2007  . DIVERTICULOSIS OF COLON 02/15/2007  . COLONIC POLYPS 02/12/2007  . Essential hypertension 02/12/2007  . RENAL CALCULUS 02/12/2007  . Osteoarthritis 02/12/2007    Past Surgical History:  Procedure Laterality Date  . APPENDECTOMY    . cartarized  nose blood vessel     left side  . CATARACT EXTRACTION     RIGHT EYE  . CHOLECYSTECTOMY    . cystoscopy  04/17/11   stent placed  . CYSTOSCOPY W/ URETERAL STENT PLACEMENT  04/17/2011   Procedure: CYSTOSCOPY WITH RETROGRADE PYELOGRAM/URETERAL STENT  PLACEMENT;  Surgeon: Hanley Ben, MD;  Location: [facility];  Service: Urology;  Laterality: Left;  . CYSTOSCOPY WITH RETROGRADE PYELOGRAM, URETEROSCOPY AND STENT PLACEMENT Left 04/18/2013   Procedure: CYSTOSCOPY WITH LEFT RETROGRADE PYELOGRAM, LEFT URETEROSCOPY AND LASER LITHOTRIPSY LEFT STENT PLACEMENT;  Surgeon: Dutch Gray, MD;  Location: [facility];  Service: Urology;  Laterality: Left;  . HOLMIUM LASER APPLICATION Left 0/07/2692   Procedure: HOLMIUM LASER APPLICATION;  Surgeon: Dutch Gray, MD;  Location: [facility];  Service: Urology;  Laterality: Left;  . IRRIGATION AND  DEBRIDEMENT ABSCESS Left 10/05/2014   Procedure: IAND D LEFT INDEX FINGER;  Surgeon: Dayna Barker, MD;  Location: [facility];  Service: Plastics;  Laterality: Left;  . KNEE SURGERY     bilateral ARTHROSCOPY X2 TO EACH KNEE  . MELANOMA EXCISION  2018  . MELANOMA EXCISION WITH SENTINEL LYMPH NODE BIOPSY Left 03/29/2020   Procedure: WIDE LOCAL EXCISION, ADVANCED FLAP CLOSURE LEFT ARM MELANOMA DEFECT WITH SENTINEL LYMPH NODE MAPPING AND BIOPSY;  Surgeon: Stark Klein, MD;  Location: [facility];  Service: General;  Laterality: Left;  . S/P cysto & stents for kidnedy stone 11/08 by Dr. Reece Agar    . 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 SHOULDER SURGERY  04/2005   left by Dr. Berenice Primas    Allergies Codeine phosphate, Morphine, and Metformin and related  Family History  Problem Relation Age of Onset  . Heart attack Mother   . Dementia Brother   . Colon cancer Neg Hx   . Prostate cancer Neg Hx     Social History Social History   Tobacco Use  . Smoking status: Never Smoker  . Smokeless tobacco: Never Used  Vaping Use  . Vaping Use: Never used  Substance Use Topics  . Alcohol use: No    Alcohol/week: 0.0 standard drinks  . Drug use: No    Review of Systems  Constitutional: No fever/chills. Positive lightheadedness.  Eyes: No visual changes. ENT: No sore throat. Cardiovascular: Positive chest pain. Respiratory: Positive shortness of breath. Gastrointestinal: No abdominal pain. Positive nausea, no vomiting.  No diarrhea.  No constipation. Genitourinary: Negative for dysuria. Musculoskeletal: Negative for back pain. Skin: Negative for rash. Neurological: Negative for headaches, focal weakness or numbness.  10-point ROS otherwise negative.  ____________________________________________   PHYSICAL EXAM:  VITAL SIGNS: ED Triage Vitals  Enc Vitals Group     BP 04/01/20 1023 (!) 162/93     Pulse Rate 04/01/20 1023 88      Resp 04/01/20 1023 18     Temp 04/01/20 1023 98.1 F (36.7 C)     Temp Source 04/01/20 1023 Oral     SpO2 04/01/20 1023 97 %   Constitutional: Alert and oriented. Well appearing and in no acute distress. Eyes: Conjunctivae are normal.  Head: Atraumatic. Nose: No congestion/rhinnorhea. Mouth/Throat: Mucous membranes are moist. Neck: No stridor.   Cardiovascular: Normal rate, regular rhythm. Good peripheral circulation. Grossly normal heart sounds.   Respiratory: Normal respiratory effort.  No retractions. Lungs CTAB. Gastrointestinal: Soft and nontender. No distention.  Musculoskeletal: No lower extremity tenderness nor edema. No gross deformities of extremities. Neurologic:  Normal speech and language.  Skin: Wound to the left posterior shoulder evaluated.  No active bleeding or other drainage.  No dehiscence.  Significant surrounding bruising as would be expected postoperatively. No findings on exam to suspect compartment syndrome.    ____________________________________________   LABS (all labs ordered are listed, but  only abnormal results are displayed)  Labs Reviewed  BASIC METABOLIC PANEL - Abnormal; Notable for the following components:      Result Value   Glucose, Bld 208 (*)    All other components within normal limits  HEPATIC FUNCTION PANEL - Abnormal; Notable for the following components:   Total Protein 6.2 (*)    All other components within normal limits  HEMOGLOBIN A1C - Abnormal; Notable for the following components:   Hgb A1c MFr Bld 8.6 (*)    All other components within normal limits  TROPONIN I (HIGH SENSITIVITY) - Abnormal; Notable for the following components:   Troponin I (High Sensitivity) 24 (*)    All other components within normal limits  TROPONIN I (HIGH SENSITIVITY) - Abnormal; Notable for the following components:   Troponin I (High Sensitivity) 19 (*)    All other components within normal limits  RESP PANEL BY RT-PCR (FLU A&B, COVID) ARPGX2   CBC  LIPASE, BLOOD  D-DIMER, QUANTITATIVE  AMYLASE  LIPASE, BLOOD  TROPONIN I (HIGH SENSITIVITY)  TROPONIN I (HIGH SENSITIVITY)   ____________________________________________  EKG   EKG Interpretation  Date/Time:  Sunday April 01 2020 10:19:08 EST Ventricular Rate:  95 PR Interval:  176 QRS Duration: 106 QT Interval:  384 QTC Calculation: 482 R Axis:   0 Text Interpretation: Sinus rhythm with Premature atrial complexes Minimal voltage criteria for LVH, may be normal variant ( R in aVL ) Cannot rule out Anterior infarct , age undetermined Abnormal ECG No significant change since last tracing Confirmed by Nanda Quinton [phone]) on 04/01/2020 11:39:31 AM       ____________________________________________  RADIOLOGY  DG Chest 2 View  Result Date: 04/01/2020 CLINICAL DATA:  86 year old male with a history of shortness of breath EXAM: CHEST - 2 VIEW COMPARISON:  03/16/2020 FINDINGS: Cardiomediastinal silhouette unchanged in size and contour. No central vascular congestion. No interlobular septal thickening. No pneumothorax. No pleural effusion. No confluent airspace disease. Degenerative changes of the spine.  No displaced fracture IMPRESSION: Chronic lung changes without evidence of acute cardiopulmonary disease. Electronically Signed   By: Corrie Mckusick D.O.   On: 04/01/2020 10:53    ____________________________________________   PROCEDURES  Procedure(s) performed:   Procedures  None  ____________________________________________   INITIAL IMPRESSION / ASSESSMENT AND PLAN / ED COURSE  Pertinent labs & imaging results that were available during my care of the patient were reviewed by me and considered in my medical decision making (see chart for details).   Patient presents to the emergency department with chest discomfort that began this morning.  He has history of melanoma and recent wide excision of an area on the left arm on 2/17.  Chest x-ray reviewed with no  acute findings.  Patient's EKG shows some sinus arrhythmia with PACs as interpreted above. Patient with HEART score of 7. Troponin is pending. ACS is in the differential. No prior history of AMI. Review of Epic shows low risk Myoview from 2015 but no cath or other provocative testing since. PE also on the differential although pain less typical for that. Patient certainly at higher risk with melanoma and recent surgery.   Troponin is back and trending from 24 --> 19. Nursing states that patient denied pain earlier when Nitro was ordered but when I re-examine is is again having central chest pressure. Will speak with Cardiology.   Spoke with Dr. Debara Pickett to review case and labs. They will consult with stable troponin. Hold on Heparin for now.  Discussed patient's case with [facility] to request admission. Patient and family (if present) updated with plan. Care transferred to [facility] service.  I reviewed all nursing notes, vitals, pertinent old records, EKGs, labs, imaging (as available).  ____________________________________________  FINAL CLINICAL IMPRESSION(S) / ED DIAGNOSES  Final diagnoses:  Precordial chest pain     MEDICATIONS GIVEN DURING THIS VISIT:  Medications  nitroGLYCERIN (NITROSTAT) SL tablet 0.4 mg (0.4 mg Sublingual Given 04/01/20 1346)  aspirin EC tablet 81 mg (has no administration in time range)  traMADol (ULTRAM) tablet 50 mg (has no administration in time range)  amLODipine (NORVASC) tablet 10 mg (has no administration in time range)  carvedilol (COREG) tablet 12.5 mg (has no administration in time range)  hydrALAZINE (APRESOLINE) tablet 50 mg (has no administration in time range)  tamsulosin (FLOMAX) capsule 0.4 mg (has no administration in time range)  potassium chloride SA (KLOR-CON) CR tablet 20 mEq (has no administration in time range)  acetaminophen (TYLENOL) tablet 650 mg (has no administration in time range)  ondansetron (ZOFRAN) injection 4 mg (has no administration in  time range)  insulin aspart (novoLOG) injection 0-15 Units (has no administration in time range)  insulin glargine (LANTUS) injection 6 Units (has no administration in time range)  heparin injection 5,000 Units (5,000 Units Subcutaneous Given 04/01/20 1459)  0.9 %  sodium chloride infusion ( Intravenous New Bag/Given 04/01/20 1500)  ALPRAZolam (XANAX) tablet 0.25 mg (has no administration in time range)  lisinopril (ZESTRIL) tablet 20 mg (has no administration in time range)    And  hydrochlorothiazide (HYDRODIURIL) tablet 25 mg (has no administration in time range)  atorvastatin (LIPITOR) tablet 40 mg (has no administration in time range)    Note:  This document was prepared using Dragon voice recognition software and may include unintentional dictation errors.  Nanda Quinton, MD, [facility] Emergency Medicine    Long, Wonda Old[place], MD 04/01/20 [phone]

## 2020-04-02 ENCOUNTER — Telehealth: Payer: Self-pay

## 2020-04-02 ENCOUNTER — Observation Stay (HOSPITAL_COMMUNITY): Payer: Medicare Other

## 2020-04-02 DIAGNOSIS — I25118 Atherosclerotic heart disease of native coronary artery with other forms of angina pectoris: Secondary | ICD-10-CM | POA: Diagnosis not present

## 2020-04-02 DIAGNOSIS — R0602 Shortness of breath: Secondary | ICD-10-CM

## 2020-04-02 DIAGNOSIS — I248 Other forms of acute ischemic heart disease: Principal | ICD-10-CM

## 2020-04-02 DIAGNOSIS — R072 Precordial pain: Secondary | ICD-10-CM | POA: Diagnosis not present

## 2020-04-02 DIAGNOSIS — N401 Enlarged prostate with lower urinary tract symptoms: Secondary | ICD-10-CM | POA: Diagnosis not present

## 2020-04-02 DIAGNOSIS — R079 Chest pain, unspecified: Secondary | ICD-10-CM | POA: Diagnosis not present

## 2020-04-02 DIAGNOSIS — I10 Essential (primary) hypertension: Secondary | ICD-10-CM | POA: Diagnosis not present

## 2020-04-02 DIAGNOSIS — E785 Hyperlipidemia, unspecified: Secondary | ICD-10-CM | POA: Diagnosis not present

## 2020-04-02 LAB — COMPREHENSIVE METABOLIC PANEL
ALT: 15 U/L (ref 0–44)
AST: 15 U/L (ref 15–41)
Albumin: 3.4 g/dL — ABNORMAL LOW (ref 3.5–5.0)
Alkaline Phosphatase: 68 U/L (ref 38–126)
Anion gap: 11 (ref 5–15)
BUN: 22 mg/dL (ref 8–23)
CO2: 26 mmol/L (ref 22–32)
Calcium: 9.6 mg/dL (ref 8.9–10.3)
Chloride: 100 mmol/L (ref 98–111)
Creatinine, Ser: 1.46 mg/dL — ABNORMAL HIGH (ref 0.61–1.24)
GFR, Estimated: 47 mL/min — ABNORMAL LOW (ref >60)
Glucose, Bld: 221 mg/dL — ABNORMAL HIGH (ref 70–99)
Potassium: 3.5 mmol/L (ref 3.5–5.1)
Sodium: 137 mmol/L (ref 135–145)
Total Bilirubin: 0.9 mg/dL (ref 0.3–1.2)
Total Protein: 6.1 g/dL — ABNORMAL LOW (ref 6.5–8.1)

## 2020-04-02 LAB — CBC
HCT: 40.7 % (ref 39.0–52.0)
Hemoglobin: 13.6 g/dL (ref 13.0–17.0)
MCH: 30.9 pg (ref 26.0–34.0)
MCHC: 33.4 g/dL (ref 30.0–36.0)
MCV: 92.5 fL (ref 80.0–100.0)
Platelets: 321 10*3/uL (ref 150–400)
RBC: 4.4 MIL/uL (ref 4.22–5.81)
RDW: 13.5 % (ref 11.5–15.5)
WBC: 9.6 10*3/uL (ref 4.0–10.5)
nRBC: 0 % (ref 0.0–0.2)

## 2020-04-02 LAB — ECHOCARDIOGRAM COMPLETE
Area-P 1/2: 3.99 cm2
Calc EF: 48 %
Height: 72 in
S' Lateral: 4.5 cm
Single Plane A2C EF: 44.5 %
Single Plane A4C EF: 53 %
Weight: 3739 oz

## 2020-04-02 LAB — BRAIN NATRIURETIC PEPTIDE: B Natriuretic Peptide: 143.9 pg/mL — ABNORMAL HIGH (ref 0.0–100.0)

## 2020-04-02 LAB — GLUCOSE, CAPILLARY
Glucose-Capillary: 237 mg/dL — ABNORMAL HIGH (ref 70–99)
Glucose-Capillary: 328 mg/dL — ABNORMAL HIGH (ref 70–99)
Glucose-Capillary: 248 mg/dL — ABNORMAL HIGH (ref 70–99)
Glucose-Capillary: 175 mg/dL — ABNORMAL HIGH (ref 70–99)

## 2020-04-02 LAB — LIPID PANEL
Cholesterol: 147 mg/dL (ref 0–200)
HDL: 30 mg/dL — ABNORMAL LOW (ref >40)
LDL Cholesterol: 80 mg/dL (ref 0–99)
Total CHOL/HDL Ratio: 4.9 RATIO
Triglycerides: 185 mg/dL — ABNORMAL HIGH (ref <150)
VLDL: 37 mg/dL (ref 0–40)

## 2020-04-02 LAB — D-DIMER, QUANTITATIVE: D-Dimer, Quant: 0.58 ug/mL-FEU — ABNORMAL HIGH (ref 0.00–0.50)

## 2020-04-02 LAB — MAGNESIUM: Magnesium: 1.7 mg/dL (ref 1.7–2.4)

## 2020-04-02 LAB — SURGICAL PATHOLOGY

## 2020-04-02 LAB — LIPASE, BLOOD: Lipase: 42 U/L (ref 11–51)

## 2020-04-02 LAB — C-REACTIVE PROTEIN: CRP: 1.5 mg/dL — ABNORMAL HIGH (ref <1.0)

## 2020-04-02 MED ORDER — PANTOPRAZOLE SODIUM 40 MG PO TBEC
40.0000 mg | DELAYED_RELEASE_TABLET | Freq: Every day | ORAL | Status: DC
  Administered 2020-04-02 – 2020-04-06 (×5): 40 mg via ORAL
  Filled 2020-04-02 (×5): qty 1

## 2020-04-02 MED ORDER — LACTATED RINGERS IV SOLN: INTRAVENOUS | Status: AC

## 2020-04-02 MED ORDER — PERFLUTREN LIPID MICROSPHERE
1.0000 mL | INTRAVENOUS | Status: AC | PRN
  Administered 2020-04-02: 3 mL via INTRAVENOUS
  Filled 2020-04-02: qty 10

## 2020-04-02 NOTE — Progress Notes (Signed)
Incision at melanoma excision site was examined at bedside 2/20 by Dr. Kieth Brightly. Nothing acutely worrisome, similar in appearance to immediately post-op. Continue to cleanse incision daily with soap and warm water and then gently dry. Apply dry dressing as needed.  Norm Parcel, [facility] Surgery 04/02/2020, 11:42 AM Please see Amion for pager number during day hours 7:00am-4:30pm

## 2020-04-02 NOTE — Telephone Encounter (Signed)
[place] Night - Client Nonclinical Telephone Record AccessNurse Client [place] [facility] Night - Client Client Site [place] - Night Physician [place] - PHYSICIAN, NOT LISTED- MD Contact Type Call Who Is Calling Patient / Member / Family / Caregiver Caller Name [place] Espina Caller Phone Number [phone] Patient Name [name] Patient DOB 03-11-1934 Call Type Message Only Information Provided Reason for Call Request for General Office Information Initial Comment Caller states his father is in the hospital. He is in [facility] in [place]. Dr. [name] Disp. Time Disposition Final User 04/01/2020 1:01:38 PM General Information Provided Yes Artis Flock Call Closed By: Artis Flock Transaction Date/Time: 04/01/2020 12:58:34 PM (ET)

## 2020-04-02 NOTE — Evaluation (Signed)
Physical Therapy Evaluation Patient Details Name: [name] MRN: [id number] DOB: [birth year] Today's Date: 04/02/2020   History of Present Illness  [name] is a 86 y.o. male who reports having chest pressure with associated nausea and a feeling of being light-headed, also had ongoing left shoulder pain postop (left shoulder melanoma removal a few days PTA).  He was admitted for chest pain, AKI and ongoing left shoulder discomfort.  PMH:  CAD, DM, HTN, recently had left shoulder melanoma surgery  Clinical Impression  Pt admitted with above diagnosis. Pt was able to ambulate in hallway with overall good balance with RW.  Without device, needs assist. Recommend sister come stay with pt initially and pt agrees. Will follow acutely and progress pt as able. Pt currently with functional limitations due to the deficits listed below (see PT Problem List). Pt will benefit from skilled PT to increase their independence and safety with mobility to allow discharge to the venue listed below.      Follow Up Recommendations Home health PT;Supervision/Assistance - 24 hour (recommend sister stay with pt initially)    Equipment Recommendations  None recommended by PT    Recommendations for Other Services       Precautions / Restrictions Precautions Precautions: Fall Restrictions Weight Bearing Restrictions: No      Mobility  Bed Mobility Overal bed mobility: Independent                  Transfers Overall transfer level: Needs assistance Equipment used: Rolling walker (2 wheeled) Transfers: Sit to/from Stand Sit to Stand: Min assist         General transfer comment: Needed steadying assist intiially upon standing.  Ambulation/Gait Ambulation/Gait assistance: Min assist;Min guard Gait Distance (Feet): 150 Feet Assistive device: Rolling walker (2 wheeled) Gait Pattern/deviations: Step-through pattern;Decreased stride length;Trunk flexed;Wide base of support;Antalgic    Gait velocity interpretation: <1.8 ft/sec, indicate of risk for recurrent falls General Gait Details: Pt needed steadying assist at times as well as cues for posture as he tends to flex at trunk.  Pt c/o soreness at times of left shoulder but was able to ambulate with use of RW. definite need for bil UE support for stability and told pt that PT does not recommend that pt does not use RW.  Stairs            Wheelchair Mobility    Modified Rankin (Stroke Patients Only)       Balance Overall balance assessment: Needs assistance Sitting-balance support: No upper extremity supported;Feet supported Sitting balance-Leahy Scale: Fair     Standing balance support: Bilateral upper extremity supported;During functional activity Standing balance-Leahy Scale: Poor Standing balance comment: relies on UE support for balance for static stance and dynamic activity.                             Pertinent Vitals/Pain Pain Assessment: Faces Faces Pain Scale: Hurts even more Pain Location: left shoulder Pain Descriptors / Indicators: Aching;Grimacing;Guarding;Discomfort Pain Intervention(s): Limited activity within patient's tolerance;Monitored during session;Repositioned    Home Living Family/patient expects to be discharged to:: Private residence Living Arrangements: Children Available Help at Discharge: Family;Available 24 hours/day (son works, sister close by) Type of Home: House Home Access: Stairs to enter Entrance Stairs-Rails: Right Entrance Stairs-Number of Steps: 3 Home Layout: One level Home Equipment: Environmental consultant - 2 wheels;Cane - single point;Bedside commode;Grab bars - tub/shower      Prior Function Level of Independence:  Independent         Comments: B/D self and didnt use equipment per pt     Hand Dominance   Dominant Hand: Right    Extremity/Trunk Assessment   Upper Extremity Assessment Upper Extremity Assessment: Defer to OT evaluation    Lower  Extremity Assessment Lower Extremity Assessment: Generalized weakness    Cervical / Trunk Assessment Cervical / Trunk Assessment: Kyphotic  Communication   Communication: No difficulties  Cognition Arousal/Alertness: Awake/alert Behavior During Therapy: WFL for tasks assessed/performed Overall Cognitive Status: Within Functional Limits for tasks assessed                                        General Comments      Exercises     Assessment/Plan    PT Assessment Patient needs continued PT services  PT Problem List Decreased activity tolerance;Decreased balance;Decreased mobility;Decreased knowledge of use of DME;Decreased safety awareness;Decreased knowledge of precautions       PT Treatment Interventions DME instruction;Gait training;Functional mobility training;Stair training;Therapeutic activities;Therapeutic exercise;Balance training;Patient/family education    PT Goals (Current goals can be found in the Care Plan section)  Acute Rehab PT Goals Patient Stated Goal: to go home PT Goal Formulation: With patient Time For Goal Achievement: 04/16/20 Potential to Achieve Goals: Good    Frequency Min 3X/week   Barriers to discharge        Co-evaluation               AM-PAC PT "6 Clicks" Mobility  Outcome Measure Help needed turning from your back to your side while in a flat bed without using bedrails?: None Help needed moving from lying on your back to sitting on the side of a flat bed without using bedrails?: A Little Help needed moving to and from a bed to a chair (including a wheelchair)?: A Little Help needed standing up from a chair using your arms (e.g., wheelchair or bedside chair)?: A Little Help needed to walk in hospital room?: A Little Help needed climbing 3-5 steps with a railing? : A Little 6 Click Score: 19    End of Session Equipment Utilized During Treatment: Gait belt Activity Tolerance: Patient limited by fatigue Patient  left: with call bell/phone within reach;in bed (sitting EOB on departure with nurse aware.) Nurse Communication: Mobility status PT Visit Diagnosis: Unsteadiness on feet (R26.81);Muscle weakness (generalized) (M62.81)    Time: 7846-9629 PT Time Calculation (min) (ACUTE ONLY): 13 min   Charges:   PT Evaluation $PT Eval Moderate Complexity: 1 Mod          Elgin Carn W,PT Acute Rehabilitation Services Pager:  [phone]  Office:  [phone]    Denice Paradise 04/02/2020, 3:32 PM

## 2020-04-02 NOTE — Progress Notes (Signed)
PROGRESS NOTE                                                                                                                                                                                                             Patient Demographics:    [name], is a 86 y.o. male, DOB - 1934/03/12, MVE:[id number]  Outpatient Primary MD for the patient is Tonia Ghent, MD    LOS - 0  Admit date - 04/01/2020    Chief Complaint  Patient presents with  . Chest Pain       Brief Narrative (HPI from H&P) - [name] is a 86 y.o. male with medical history significant of h/o CAD with last myoview 2014 read as low risk, DM, HTN, recently had left shoulder melanoma surgery by Dr. Barry Dienes few days prior to hospital visit, who reports having chest pressure at 0800 hrs with associated nausea and a feeling of being light-headed, also had ongoing left shoulder pain postop.  He was admitted for chest pain, AKI and ongoing left shoulder discomfort.   Subjective:    [name] today in bed in no distress complains of mild substernal chest pain and left shoulder discomfort, no abdominal pain, no shortness of breath, no focal deficits.   Assessment  & Plan :    1.  Chest pain in a patient with history of CAD. He had non-ACS pattern troponin rise, currently on aspirin, Coreg and statin for secondary prevention.  Now pain and symptom-free, cardiology on board, getting echocardiogram to evaluate EF and wall motion.  Will monitor.   2. Abdominal pain.  Completely resolved will place on PPI and monitor.  Lipase is stable.  Exam unremarkable  3. Dyslipidemia.  Continue statin at home dose.  4. Hypertension.  On Coreg and Norvasc combination continue to monitor.  5. BPH.  Continue Flomax.  6.  Recent left shoulder melanoma surgery by Dr. Barry Dienes, some pain and discomfort at the site.  Have requested surgery to evaluate.  7.  AKI.   Hydrate and monitor.    8. DM Type II.  On Lantus with sliding scale.  Lab Results  Component Value Date   HGBA1C 8.6 (H) 04/01/2020   CBG (last 3)  Recent Labs    04/01/20 2141 04/02/20 0635 04/02/20 1058  GLUCAP 241* 237* 328*  Condition - Fair  Family Communication  :  Azzie Glatter [phone] - 04/02/2020  Code Status :  DNR  Consults  :  Cards  PUD Prophylaxis : PPI   Procedures  :      TTE      Disposition Plan  :    Status is: Observation  Dispo: The patient is from: Home              Anticipated d/c is to: Home              Anticipated d/c date is: 3 days              Patient currently is not medically stable to d/c.   Difficult to place patient No   DVT Prophylaxis  :   Heparin    Lab Results  Component Value Date   PLT 321 04/02/2020    Diet :  Diet Order            Diet heart healthy/carb modified Room service appropriate? Yes; Fluid consistency: Thin  Diet effective now                  Inpatient Medications  Scheduled Meds: . amLODipine  10 mg Oral q morning  . aspirin EC  81 mg Oral QPM  . atorvastatin  40 mg Oral Daily  . carvedilol  12.5 mg Oral BID WC  . heparin  5,000 Units Subcutaneous Q8H  . hydrALAZINE  50 mg Oral TID  . insulin aspart  0-15 Units Subcutaneous TID WC  . insulin glargine  6 Units Subcutaneous QHS  . potassium chloride SA  20 mEq Oral QPM  . tamsulosin  0.4 mg Oral QPC supper   Continuous Infusions: . sodium chloride 10 mL/hr at 04/01/20 1500   PRN Meds:.acetaminophen, ALPRAZolam, nitroGLYCERIN, ondansetron (ZOFRAN) IV, traMADol  Antibiotics  :    Anti-infectives (From admission, onward)   None       Time Spent in minutes  30   Lala Lund M.D on 04/02/2020 at 11:29 AM  To page go to [email or web address]   [facility] -  Office  [phone]   See all Orders from today for further details    Objective:   Vitals:   04/01/20 2338 04/02/20 0413 04/02/20 0738 04/02/20  1056  BP: (!) 149/62 (!) 179/93 122/69 (!) 147/53  Pulse: 73 89 73 71  Resp: 20 19 20 19   Temp: 98 F (36.7 C) 98.1 F (36.7 C) 98.5 F (36.9 C) 98.1 F (36.7 C)  TempSrc: Oral Oral Oral Oral  SpO2: 95% 100%    Weight:      Height:        Wt Readings from Last 3 Encounters:  04/01/20 106 kg  03/29/20 105.7 kg  01/03/20 106.1 kg     Intake/Output Summary (Last 24 hours) at 04/02/2020 1129 Last data filed at 04/01/2020 1434 Gross per 24 hour  Intake 400 ml  Output -  Net 400 ml     Physical Exam  Awake Alert, No new F.N deficits, Normal affect Sarah Ann.AT,PERRAL Supple Neck,No JVD, No cervical lymphadenopathy appriciated.  Symmetrical Chest wall movement, Good air movement bilaterally, CTAB RRR,No Gallops,Rubs or new Murmurs, No Parasternal Heave +ve B.Sounds, Abd Soft, No tenderness, No organomegaly appriciated, No rebound - guarding or rigidity. No Cyanosis, left shoulder large bandage in place at the site of surgery, some hematoma tracking down,    Data Review:    CBC Recent Labs  Lab  03/29/20 0604 04/01/20 1058 04/02/20 0748  WBC 8.1 9.1 9.6  HGB 15.2 14.5 13.6  HCT 44.2 44.8 40.7  PLT 287 317 321  MCV 93.1 94.3 92.5  MCH 32.0 30.5 30.9  MCHC 34.4 32.4 33.4  RDW 13.7 13.6 13.5    Recent Labs  Lab 03/29/20 0604 04/01/20 1058 04/01/20 1155 04/01/20 1458 04/02/20 0748  NA 140 138  --   --  137  K 3.4* 3.6  --   --  3.5  CL 104 100  --   --  100  CO2 23 27  --   --  26  GLUCOSE 228* 208*  --   --  221*  BUN 25* 17  --   --  22  CREATININE 1.18 1.09  --   --  1.46*  CALCIUM 9.7 9.8  --   --  9.6  AST  --   --  15  --  15  ALT  --   --  15  --  15  ALKPHOS  --   --  59  --  68  BILITOT  --   --  0.5  --  0.9  ALBUMIN  --   --  3.5  --  3.4*  MG  --   --   --   --  1.7  CRP  --   --   --   --  1.5*  DDIMER  --   --  0.42  --  0.58*  HGBA1C  --   --   --  8.6*  --   BNP  --   --   --   --  143.9*     ------------------------------------------------------------------------------------------------------------------ Recent Labs    04/02/20 0029  CHOL 147  HDL 30*  LDLCALC 80  TRIG 185*  CHOLHDL 4.9    Lab Results  Component Value Date   HGBA1C 8.6 (H) 04/01/2020   ------------------------------------------------------------------------------------------------------------------ No results for input(s): TSH, T4TOTAL, T3FREE, THYROIDAB in the last 72 hours.  Invalid input(s): FREET3  Cardiac Enzymes No results for input(s): CKMB, TROPONINI, MYOGLOBIN in the last 168 hours.  Invalid input(s): CK ------------------------------------------------------------------------------------------------------------------    Component Value Date/Time   BNP 143.9 (H) 04/02/2020 6314    Micro Results Recent Results (from the past 240 hour(s))  SARS CORONAVIRUS 2 (TAT 6-24 HRS) Nasopharyngeal Nasopharyngeal Swab     Status: None   Collection Time: 03/26/20  9:01 AM   Specimen: Nasopharyngeal Swab  Result Value Ref Range Status   SARS Coronavirus 2 NEGATIVE NEGATIVE Final    Comment: (NOTE) SARS-CoV-2 target nucleic acids are NOT DETECTED.  The SARS-CoV-2 RNA is generally detectable in upper and lower respiratory specimens during the acute phase of infection. Negative results do not preclude SARS-CoV-2 infection, do not rule out co-infections with other pathogens, and should not be used as the sole basis for treatment or other patient management decisions. Negative results must be combined with clinical observations, patient history, and epidemiological information. The expected result is Negative.  Fact Sheet for Patients: SugarRoll.be  Fact Sheet for Healthcare Providers: https://www.woods-mathews.com/  This test is not yet approved or cleared by the [place] FDA and  has been authorized for detection and/or diagnosis of  SARS-CoV-2 by FDA under an Emergency Use Authorization (EUA). This EUA will remain  in effect (meaning this test can be used) for the duration of the COVID-19 declaration under Se ction 564(b)(1) of the Act, 21 U.S.C. section 360bbb-3(b)(1), unless the authorization  is terminated or revoked sooner.  Performed at [facility], [place] [address], [place], [place] [address]   Resp Panel by RT-PCR (Flu A&B, Covid) Nasopharyngeal Swab     Status: None   Collection Time: 04/01/20 11:55 AM   Specimen: Nasopharyngeal Swab; Nasopharyngeal(NP) swabs in vial transport medium  Result Value Ref Range Status   SARS Coronavirus 2 by RT PCR NEGATIVE NEGATIVE Final    Comment: (NOTE) SARS-CoV-2 target nucleic acids are NOT DETECTED.  The SARS-CoV-2 RNA is generally detectable in upper respiratory specimens during the acute phase of infection. The lowest concentration of SARS-CoV-2 viral copies this assay can detect is 138 copies/mL. A negative result does not preclude SARS-Cov-2 infection and should not be used as the sole basis for treatment or other patient management decisions. A negative result may occur with  improper specimen collection/handling, submission of specimen other than nasopharyngeal swab, presence of viral mutation(s) within the areas targeted by this assay, and inadequate number of viral copies(<138 copies/mL). A negative result must be combined with clinical observations, patient history, and epidemiological information. The expected result is Negative.  Fact Sheet for Patients:  EntrepreneurPulse.com.au  Fact Sheet for Healthcare Providers:  IncredibleEmployment.be  This test is no t yet approved or cleared by the [place] FDA and  has been authorized for detection and/or diagnosis of SARS-CoV-2 by FDA under an Emergency Use Authorization (EUA). This EUA will remain  in effect (meaning this test can be used) for the duration of  the COVID-19 declaration under Section 564(b)(1) of the Act, 21 U.S.C.section 360bbb-3(b)(1), unless the authorization is terminated  or revoked sooner.       Influenza A by PCR NEGATIVE NEGATIVE Final   Influenza B by PCR NEGATIVE NEGATIVE Final    Comment: (NOTE) The Xpert Xpress SARS-CoV-2/FLU/RSV plus assay is intended as an aid in the diagnosis of influenza from Nasopharyngeal swab specimens and should not be used as a sole basis for treatment. Nasal washings and aspirates are unacceptable for Xpert Xpress SARS-CoV-2/FLU/RSV testing.  Fact Sheet for Patients: EntrepreneurPulse.com.au  Fact Sheet for Healthcare Providers: IncredibleEmployment.be  This test is not yet approved or cleared by the [place] FDA and has been authorized for detection and/or diagnosis of SARS-CoV-2 by FDA under an Emergency Use Authorization (EUA). This EUA will remain in effect (meaning this test can be used) for the duration of the COVID-19 declaration under Section 564(b)(1) of the Act, 21 U.S.C. section 360bbb-3(b)(1), unless the authorization is terminated or revoked.  Performed at [facility], [place] [address], [place], [place] [address]     Radiology Reports DG Chest 2 View  Result Date: 04/01/2020 CLINICAL DATA:  86 year old male with a history of shortness of breath EXAM: CHEST - 2 VIEW COMPARISON:  03/16/2020 FINDINGS: Cardiomediastinal silhouette unchanged in size and contour. No central vascular congestion. No interlobular septal thickening. No pneumothorax. No pleural effusion. No confluent airspace disease. Degenerative changes of the spine.  No displaced fracture IMPRESSION: Chronic lung changes without evidence of acute cardiopulmonary disease. Electronically Signed   By: Corrie Mckusick D.O.   On: 04/01/2020 10:53   NM Sentinel Node Inj-No Rpt (Melanoma)  Result Date: 03/29/2020 Sulfur colloid was injected by the nuclear medicine  technologist for melanoma sentinel node.   DG Chest Port 1 View  Result Date: 04/02/2020 CLINICAL DATA:  Shortness of breath. EXAM: PORTABLE CHEST 1 VIEW COMPARISON:  04/01/2020 FINDINGS: Patient is partially rotated to the left. Heart size is unchanged given  patient positioning. Aortic atherosclerotic calcification noted. Both lungs are clear. Surgical clips again noted in left axilla. IMPRESSION: No active disease. Electronically Signed   By: Marlaine Hind M.D.   On: 04/02/2020 08:10

## 2020-04-02 NOTE — Progress Notes (Signed)
Progress Note  Patient Name: [name] Date of Encounter: 04/02/2020  [facility] Cardiologist: Candee Furbish, MD   Subjective   Feeling well.  Denies any recurrent chest pain.  Breathing is stable.  He notes that nitroglycerin did help his pain yesterday.  He has ambulated in the room without any discomfort.  Inpatient Medications    Scheduled Meds: . amLODipine  10 mg Oral q morning  . aspirin EC  81 mg Oral QPM  . atorvastatin  40 mg Oral Daily  . carvedilol  12.5 mg Oral BID WC  . heparin  5,000 Units Subcutaneous Q8H  . hydrALAZINE  50 mg Oral TID  . insulin aspart  0-15 Units Subcutaneous TID WC  . insulin glargine  6 Units Subcutaneous QHS  . potassium chloride SA  20 mEq Oral QPM  . tamsulosin  0.4 mg Oral QPC supper   Continuous Infusions: . sodium chloride 10 mL/hr at 04/01/20 1500   PRN Meds: acetaminophen, ALPRAZolam, nitroGLYCERIN, ondansetron (ZOFRAN) IV, traMADol   Vital Signs    Vitals:   04/01/20 2025 04/01/20 2338 04/02/20 0413 04/02/20 0738  BP: (!) 141/60 (!) 149/62 (!) 179/93 122/69  Pulse: 90 73 89 73  Resp: 20 20 19 20   Temp: (!) 97.5 F (36.4 C) 98 F (36.7 C) 98.1 F (36.7 C) 98.5 F (36.9 C)  TempSrc: Oral Oral Oral Oral  SpO2: 98% 95% 100%   Weight:      Height:        Intake/Output Summary (Last 24 hours) at 04/02/2020 0901 Last data filed at 04/01/2020 1434 Gross per 24 hour  Intake 400 ml  Output --  Net 400 ml   Last 3 Weights 04/01/2020 03/29/2020 03/28/2020  Weight (lbs) 233 lb 11 oz 233 lb 233 lb  Weight (kg) 106 kg 105.688 kg 105.688 kg      Telemetry    Sinus rhythm.  Frequent PVCs.  9 beats SVT.- Personally Reviewed  ECG    04/01/2020: Sinus rhythm.  PACs.  Poor R wave progression.  Possible LVH. - Personally Reviewed  Physical Exam   VS:  BP 122/69 (BP Location: Right Arm)   Pulse 73   Temp 98.5 F (36.9 C) (Oral)   Resp 20   Ht 6' (1.829 m)   Wt 106 kg   SpO2 100%   BMI 31.69 kg/m  , BMI Body  mass index is 31.69 kg/m. GENERAL:  Well appearing HEENT: Pupils equal round and reactive, fundi not visualized, oral mucosa unremarkable NECK:  No jugular venous distention, waveform within normal limits, carotid upstroke brisk and symmetric LUNGS:  Coarse breath sounds.  No crackles or wheezes HEART:  RRR.  PMI not displaced or sustained,S1 and S2 within normal limits, no S3, no S4, no clicks, no rubs, no murmurs ABD:  Flat, positive bowel sounds normal in frequency in pitch, no bruits, no rebound, no guarding, no midline pulsatile mass, no hepatomegaly, no splenomegaly EXT:  2 plus pulses throughout, no edema, no cyanosis no clubbing SKIN:  No rashes no nodules NEURO:  Cranial nerves II through XII grossly intact, motor grossly intact throughout PSYCH:  Cognitively intact, oriented to person place and time   Labs    High Sensitivity Troponin:   Recent Labs  Lab 04/01/20 1058 04/01/20 1155 04/01/20 1458 04/01/20 1856  TROPONINIHS 24* 19* 24* 27*      Chemistry Recent Labs  Lab 03/29/20 0604 04/01/20 1058 04/01/20 1155  NA 140 138  --  K 3.4* 3.6  --   CL 104 100  --   CO2 23 27  --   GLUCOSE 228* 208*  --   BUN 25* 17  --   CREATININE 1.18 1.09  --   CALCIUM 9.7 9.8  --   PROT  --   --  6.2*  ALBUMIN  --   --  3.5  AST  --   --  15  ALT  --   --  15  ALKPHOS  --   --  59  BILITOT  --   --  0.5  GFRNONAA >60 >60  --   ANIONGAP 13 11  --      Hematology Recent Labs  Lab 03/29/20 0604 04/01/20 1058 04/02/20 0748  WBC 8.1 9.1 9.6  RBC 4.75 4.75 4.40  HGB 15.2 14.5 13.6  HCT 44.2 44.8 40.7  MCV 93.1 94.3 92.5  MCH 32.0 30.5 30.9  MCHC 34.4 32.4 33.4  RDW 13.7 13.6 13.5  PLT 287 317 321    BNPNo results for input(s): BNP, PROBNP in the last 168 hours.   DDimer  Recent Labs  Lab 04/01/20 1155 04/02/20 0748  DDIMER 0.42 0.58*     Radiology    DG Chest 2 View  Result Date: 04/01/2020 CLINICAL DATA:  86 year old male with a history of  shortness of breath EXAM: CHEST - 2 VIEW COMPARISON:  03/16/2020 FINDINGS: Cardiomediastinal silhouette unchanged in size and contour. No central vascular congestion. No interlobular septal thickening. No pneumothorax. No pleural effusion. No confluent airspace disease. Degenerative changes of the spine.  No displaced fracture IMPRESSION: Chronic lung changes without evidence of acute cardiopulmonary disease. Electronically Signed   By: Corrie Mckusick D.O.   On: 04/01/2020 10:53   DG Chest Port 1 View  Result Date: 04/02/2020 CLINICAL DATA:  Shortness of breath. EXAM: PORTABLE CHEST 1 VIEW COMPARISON:  04/01/2020 FINDINGS: Patient is partially rotated to the left. Heart size is unchanged given patient positioning. Aortic atherosclerotic calcification noted. Both lungs are clear. Surgical clips again noted in left axilla. IMPRESSION: No active disease. Electronically Signed   By: Marlaine Hind M.D.   On: 04/02/2020 08:10    Cardiac Studies   Echo 01/27/14: Study Conclusions   - Left ventricle: The cavity size was mildly dilated. Wall  thickness was increased in a pattern of mild LVH. Systolic  function was mildly reduced. The estimated ejection fraction was  in the range of 45% to 50%. Doppler parameters are consistent  with abnormal left ventricular relaxation (grade 1 diastolic  dysfunction).  - Left atrium: The atrium was mildly dilated.  - Right ventricle: The cavity size was mildly dilated.  - Right atrium: The atrium was mildly dilated.  - Pulmonary arteries: PA peak pressure: 32 mm Hg (S).    Patient Profile     86 y.o. male with chronic systolic and diastolic heart failure, hypertension, diabetes, hyperlipidemia and melanoma admitted with nausea, vomiting, and chest pain.  Assessment & Plan    #Chest pain: #Elevated troponin: # Hyperlipidemia: Patient admitted with nausea and vomiting as well as chest pain.  Cardiac enzymes were mildly elevated and flat.  Overall  thought to be more related to demand ischemia than obstructive coronary disease.  This morning his chest pain-free and ambulating without difficulty.  We will get an echocardiogram to assess for new wall motion abnormalities.  Prior cath in 2004 was normal, nuclear stress test low risk in 2014.  Continue aspirin, carvedilol and  atorvastatin.  #Chronic systolic diastolic heart failure: # Hypertensive urgency: LVEF was 35 to 40% in 2014 and 45 to 50% on echo in 2015.  Repeat echo pending.  He is euvolemic on exam.  Suspect likely hypertensive cardiomyopathy.  Blood pressure was poorly controlled on admission.  Much better this AM.  Amlodipine was restarted yesterday.  Will reassess once his echo is back.  Continue amlodipine, carvedilol, hydralazine for now.  His home lisinopril and HCTZ have been held in the setting of nausea and vomiting and possible sepsis from melanoma resection site infection.      For questions or updates, please contact [place] Please consult [email or web address] for contact info under        Signed, Skeet Latch, MD  04/02/2020, 9:01 AM

## 2020-04-02 NOTE — Progress Notes (Signed)
Echocardiogram 2D Echocardiogram with definity has been performed.  [name] M 04/02/2020, 1:55 PM

## 2020-04-02 NOTE — Telephone Encounter (Signed)
Per chart review test pt was admitted to [facility] on 04/01/20. Pt had melanoma on arm last Thursday removed. Waiting on reports if CA. Pt had excessive bleeding on lt arm and took pt back to doctor that did melanoma surgery and pts son thinks reenforced or resutured pt to control bleeding. Pt was taken to [facility] ED due to sweating and feeling bad and pt was admitted to rule out blood clot. Legrand Como has not heard from a doctor so far today. Legrand Como wanted DR Damita Dunnings to be aware of what was going on and Legrand Como is aware Dr Damita Dunnings is out of office until 04/05/20. Legrand Como will cb with any other needed info. Sending note to Dr Damita Dunnings as Juluis Rainier.

## 2020-04-03 DIAGNOSIS — Z20822 Contact with and (suspected) exposure to covid-19: Secondary | ICD-10-CM | POA: Diagnosis not present

## 2020-04-03 DIAGNOSIS — Z8249 Family history of ischemic heart disease and other diseases of the circulatory system: Secondary | ICD-10-CM | POA: Diagnosis not present

## 2020-04-03 DIAGNOSIS — N401 Enlarged prostate with lower urinary tract symptoms: Secondary | ICD-10-CM | POA: Diagnosis present

## 2020-04-03 DIAGNOSIS — C4362 Malignant melanoma of left upper limb, including shoulder: Secondary | ICD-10-CM | POA: Diagnosis not present

## 2020-04-03 DIAGNOSIS — E785 Hyperlipidemia, unspecified: Secondary | ICD-10-CM | POA: Diagnosis not present

## 2020-04-03 DIAGNOSIS — L7631 Postprocedural hematoma of skin and subcutaneous tissue following a dermatologic procedure: Secondary | ICD-10-CM | POA: Diagnosis not present

## 2020-04-03 DIAGNOSIS — E1159 Type 2 diabetes mellitus with other circulatory complications: Secondary | ICD-10-CM | POA: Diagnosis not present

## 2020-04-03 DIAGNOSIS — R079 Chest pain, unspecified: Secondary | ICD-10-CM | POA: Diagnosis not present

## 2020-04-03 DIAGNOSIS — I5042 Chronic combined systolic (congestive) and diastolic (congestive) heart failure: Secondary | ICD-10-CM | POA: Diagnosis not present

## 2020-04-03 DIAGNOSIS — N179 Acute kidney failure, unspecified: Secondary | ICD-10-CM | POA: Diagnosis not present

## 2020-04-03 DIAGNOSIS — M1990 Unspecified osteoarthritis, unspecified site: Secondary | ICD-10-CM | POA: Diagnosis not present

## 2020-04-03 DIAGNOSIS — I248 Other forms of acute ischemic heart disease: Secondary | ICD-10-CM | POA: Diagnosis not present

## 2020-04-03 DIAGNOSIS — Z794 Long term (current) use of insulin: Secondary | ICD-10-CM | POA: Diagnosis not present

## 2020-04-03 DIAGNOSIS — Z885 Allergy status to narcotic agent status: Secondary | ICD-10-CM | POA: Diagnosis not present

## 2020-04-03 DIAGNOSIS — Z87828 Personal history of other (healed) physical injury and trauma: Secondary | ICD-10-CM | POA: Diagnosis not present

## 2020-04-03 DIAGNOSIS — I10 Essential (primary) hypertension: Secondary | ICD-10-CM | POA: Diagnosis not present

## 2020-04-03 DIAGNOSIS — I428 Other cardiomyopathies: Secondary | ICD-10-CM | POA: Diagnosis not present

## 2020-04-03 DIAGNOSIS — I110 Hypertensive heart disease with heart failure: Secondary | ICD-10-CM | POA: Diagnosis present

## 2020-04-03 DIAGNOSIS — Z79899 Other long term (current) drug therapy: Secondary | ICD-10-CM | POA: Diagnosis not present

## 2020-04-03 DIAGNOSIS — Z66 Do not resuscitate: Secondary | ICD-10-CM | POA: Diagnosis not present

## 2020-04-03 DIAGNOSIS — Z7982 Long term (current) use of aspirin: Secondary | ICD-10-CM | POA: Diagnosis not present

## 2020-04-03 DIAGNOSIS — I160 Hypertensive urgency: Secondary | ICD-10-CM | POA: Diagnosis present

## 2020-04-03 DIAGNOSIS — Z7984 Long term (current) use of oral hypoglycemic drugs: Secondary | ICD-10-CM | POA: Diagnosis not present

## 2020-04-03 DIAGNOSIS — M7989 Other specified soft tissue disorders: Secondary | ICD-10-CM | POA: Diagnosis not present

## 2020-04-03 DIAGNOSIS — I25118 Atherosclerotic heart disease of native coronary artery with other forms of angina pectoris: Secondary | ICD-10-CM | POA: Diagnosis not present

## 2020-04-03 DIAGNOSIS — I2510 Atherosclerotic heart disease of native coronary artery without angina pectoris: Secondary | ICD-10-CM | POA: Diagnosis not present

## 2020-04-03 DIAGNOSIS — E1165 Type 2 diabetes mellitus with hyperglycemia: Secondary | ICD-10-CM | POA: Diagnosis not present

## 2020-04-03 DIAGNOSIS — Y838 Other surgical procedures as the cause of abnormal reaction of the patient, or of later complication, without mention of misadventure at the time of the procedure: Secondary | ICD-10-CM | POA: Diagnosis present

## 2020-04-03 DIAGNOSIS — N138 Other obstructive and reflux uropathy: Secondary | ICD-10-CM | POA: Diagnosis not present

## 2020-04-03 DIAGNOSIS — T148XXA Other injury of unspecified body region, initial encounter: Secondary | ICD-10-CM | POA: Diagnosis not present

## 2020-04-03 DIAGNOSIS — N320 Bladder-neck obstruction: Secondary | ICD-10-CM | POA: Diagnosis present

## 2020-04-03 LAB — GLUCOSE, CAPILLARY
Glucose-Capillary: 223 mg/dL — ABNORMAL HIGH (ref 70–99)
Glucose-Capillary: 263 mg/dL — ABNORMAL HIGH (ref 70–99)
Glucose-Capillary: 227 mg/dL — ABNORMAL HIGH (ref 70–99)
Glucose-Capillary: 196 mg/dL — ABNORMAL HIGH (ref 70–99)

## 2020-04-03 LAB — CBC WITH DIFFERENTIAL/PLATELET
Abs Immature Granulocytes: 0.04 10*3/uL (ref 0.00–0.07)
Basophils Absolute: 0 10*3/uL (ref 0.0–0.1)
Basophils Relative: 1 %
Eosinophils Absolute: 0.4 10*3/uL (ref 0.0–0.5)
Eosinophils Relative: 5 %
HCT: 34.8 % — ABNORMAL LOW (ref 39.0–52.0)
Hemoglobin: 12.1 g/dL — ABNORMAL LOW (ref 13.0–17.0)
Immature Granulocytes: 1 %
Lymphocytes Relative: 32 %
Lymphs Abs: 2.6 10*3/uL (ref 0.7–4.0)
MCH: 31.8 pg (ref 26.0–34.0)
MCHC: 34.8 g/dL (ref 30.0–36.0)
MCV: 91.6 fL (ref 80.0–100.0)
Monocytes Absolute: 0.9 10*3/uL (ref 0.1–1.0)
Monocytes Relative: 11 %
Neutro Abs: 4 10*3/uL (ref 1.7–7.7)
Neutrophils Relative %: 50 %
Platelets: 296 10*3/uL (ref 150–400)
RBC: 3.8 MIL/uL — ABNORMAL LOW (ref 4.22–5.81)
RDW: 13.6 % (ref 11.5–15.5)
WBC: 7.9 10*3/uL (ref 4.0–10.5)
nRBC: 0 % (ref 0.0–0.2)

## 2020-04-03 LAB — D-DIMER, QUANTITATIVE: D-Dimer, Quant: 0.5 ug/mL-FEU (ref 0.00–0.50)

## 2020-04-03 LAB — COMPREHENSIVE METABOLIC PANEL
ALT: 15 U/L (ref 0–44)
AST: 14 U/L — ABNORMAL LOW (ref 15–41)
Albumin: 3.2 g/dL — ABNORMAL LOW (ref 3.5–5.0)
Alkaline Phosphatase: 61 U/L (ref 38–126)
Anion gap: 9 (ref 5–15)
BUN: 21 mg/dL (ref 8–23)
CO2: 26 mmol/L (ref 22–32)
Calcium: 9 mg/dL (ref 8.9–10.3)
Chloride: 99 mmol/L (ref 98–111)
Creatinine, Ser: 1.24 mg/dL (ref 0.61–1.24)
GFR, Estimated: 58 mL/min — ABNORMAL LOW (ref >60)
Glucose, Bld: 296 mg/dL — ABNORMAL HIGH (ref 70–99)
Potassium: 3.5 mmol/L (ref 3.5–5.1)
Sodium: 134 mmol/L — ABNORMAL LOW (ref 135–145)
Total Bilirubin: 0.8 mg/dL (ref 0.3–1.2)
Total Protein: 5.7 g/dL — ABNORMAL LOW (ref 6.5–8.1)

## 2020-04-03 LAB — BRAIN NATRIURETIC PEPTIDE: B Natriuretic Peptide: 131.2 pg/mL — ABNORMAL HIGH (ref 0.0–100.0)

## 2020-04-03 LAB — MAGNESIUM: Magnesium: 1.6 mg/dL — ABNORMAL LOW (ref 1.7–2.4)

## 2020-04-03 LAB — LIPASE, BLOOD: Lipase: 46 U/L (ref 11–51)

## 2020-04-03 LAB — C-REACTIVE PROTEIN: CRP: 2.6 mg/dL — ABNORMAL HIGH (ref <1.0)

## 2020-04-03 MED ORDER — POTASSIUM CHLORIDE CRYS ER 20 MEQ PO TBCR
20.0000 meq | EXTENDED_RELEASE_TABLET | Freq: Every evening | ORAL | Status: DC
  Administered 2020-04-04: 20 meq via ORAL
  Filled 2020-04-03: qty 1

## 2020-04-03 MED ORDER — MAGNESIUM SULFATE 2 GM/50ML IV SOLN
2.0000 g | Freq: Once | INTRAVENOUS | Status: AC
  Administered 2020-04-03: 2 g via INTRAVENOUS
  Filled 2020-04-03: qty 50

## 2020-04-03 MED ORDER — CEFAZOLIN SODIUM-DEXTROSE 2-4 GM/100ML-% IV SOLN
2.0000 g | INTRAVENOUS | Status: AC
  Filled 2020-04-03: qty 100

## 2020-04-03 MED ORDER — HYDROCHLOROTHIAZIDE 25 MG PO TABS
25.0000 mg | ORAL_TABLET | Freq: Every day | ORAL | Status: DC
  Administered 2020-04-03: 25 mg via ORAL
  Filled 2020-04-03: qty 1

## 2020-04-03 MED ORDER — POTASSIUM CHLORIDE CRYS ER 20 MEQ PO TBCR
40.0000 meq | EXTENDED_RELEASE_TABLET | Freq: Once | ORAL | Status: AC
  Administered 2020-04-03: 40 meq via ORAL
  Filled 2020-04-03: qty 2

## 2020-04-03 NOTE — Progress Notes (Signed)
Subjective/Chief Complaint: Still having excruciating pain.   Objective: Vital signs in last 24 hours: Temp:  [97.7 F (36.5 C)-98.5 F (36.9 C)] 97.7 F (36.5 C) (02/22 0300) Pulse Rate:  [71-89] 80 (02/22 0300) Resp:  [16-20] 20 (02/22 0300) BP: (139-153)/(53-86) 143/67 (02/22 0300) SpO2:  [90 %-93 %] 93 % (02/22 0300) Last BM Date: 04/02/20  Intake/Output from previous day: 02/21 0701 - 02/22 0700 In: 1403.4 [P.O.:480; I.V.:923.4] Out: -  Intake/Output this shift: No intake/output data recorded.  left arm wound tender, hematoma still tight, but less so.  Lab Results:  Recent Labs    04/02/20 0748 04/03/20 0127  WBC 9.6 7.9  HGB 13.6 12.1*  HCT 40.7 34.8*  PLT 321 296   BMET Recent Labs    04/02/20 0748 04/03/20 0127  NA 137 134*  K 3.5 3.5  CL 100 99  CO2 26 26  GLUCOSE 221* 296*  BUN 22 21  CREATININE 1.46* 1.24  CALCIUM 9.6 9.0   PT/INR No results for input(s): LABPROT, INR in the last 72 hours. ABG No results for input(s): PHART, HCO3 in the last 72 hours.  Invalid input(s): PCO2, PO2  Studies/Results: DG Chest 2 View  Result Date: 04/01/2020 CLINICAL DATA:  86 year old male with a history of shortness of breath EXAM: CHEST - 2 VIEW COMPARISON:  03/16/2020 FINDINGS: Cardiomediastinal silhouette unchanged in size and contour. No central vascular congestion. No interlobular septal thickening. No pneumothorax. No pleural effusion. No confluent airspace disease. Degenerative changes of the spine.  No displaced fracture IMPRESSION: Chronic lung changes without evidence of acute cardiopulmonary disease. Electronically Signed   By: Corrie Mckusick D.O.   On: 04/01/2020 10:53   DG Chest Port 1 View  Result Date: 04/02/2020 CLINICAL DATA:  Shortness of breath. EXAM: PORTABLE CHEST 1 VIEW COMPARISON:  04/01/2020 FINDINGS: Patient is partially rotated to the left. Heart size is unchanged given patient positioning. Aortic atherosclerotic calcification  noted. Both lungs are clear. Surgical clips again noted in left axilla. IMPRESSION: No active disease. Electronically Signed   By: Marlaine Hind M.D.   On: 04/02/2020 08:10   ECHOCARDIOGRAM COMPLETE  Result Date: 04/02/2020    ECHOCARDIOGRAM REPORT   Patient Name:   [name] Date of Exam: 04/02/2020 Medical Rec #:  [id number]       Height:       72.0 in Accession #:    [id number]      Weight:       233.7 lb Date of Birth:  08-30-1934      BSA:          2.276 m Patient Age:    86 years        BP:           132/67 mmHg Patient Gender: M               HR:           84 bpm. Exam Location:  Inpatient Procedure: 2D Echo, Cardiac Doppler, Color Doppler and Intracardiac            Opacification Agent Indications:    Elevated Troponin  History:        Patient has prior history of Echocardiogram examinations, most                 recent 01/27/2014. CHF, Arrythmias:PVC, Signs/Symptoms:Chest                 Pain; Risk Factors:Hypertension and Dyslipidemia.  Sonographer:    Darlina Sicilian RDCS Referring Phys: [phone] TIFFANY [place] IMPRESSIONS  1. COmpared to previous report LVEF is mildly worse.  2. LVEF is depressed with severe hypokinesis of the inferiorl hyopokinesis of the base/mid inferoseptal wall. Left ventricular ejection fraction, by estimation, is 40 to 45%. The left ventricle has mildly decreased function. The left ventricular internal cavity size was moderately dilated. Left ventricular diastolic parameters are consistent with Grade I diastolic dysfunction (impaired relaxation).  3. Right ventricular systolic function is normal. The right ventricular size is normal. There is normal pulmonary artery systolic pressure.  4. The mitral valve is grossly normal. Trivial mitral valve regurgitation.  5. The aortic valve is normal in structure. Aortic valve regurgitation is not visualized. FINDINGS  Left Ventricle: LVEF is depressed with severe hypokinesis of the inferiorl hyopokinesis of the base/mid inferoseptal  wall. Left ventricular ejection fraction, by estimation, is 40 to 45%. The left ventricle has mildly decreased function. Definity contrast agent was given IV to delineate the left ventricular endocardial borders. The left ventricular internal cavity size was moderately dilated. There is no left ventricular hypertrophy. Left ventricular diastolic parameters are consistent with Grade  I diastolic dysfunction (impaired relaxation). Right Ventricle: The right ventricular size is normal. Right vetricular wall thickness was not assessed. Right ventricular systolic function is normal. There is normal pulmonary artery systolic pressure. The tricuspid regurgitant velocity is 2.36 m/s, and with an assumed right atrial pressure of 3 mmHg, the estimated right ventricular systolic pressure is 23.5 mmHg. Left Atrium: Left atrial size was normal in size. Right Atrium: Right atrial size was normal in size. Pericardium: There is no evidence of pericardial effusion. Mitral Valve: The mitral valve is grossly normal. Trivial mitral valve regurgitation. Tricuspid Valve: The tricuspid valve is normal in structure. Tricuspid valve regurgitation is trivial. Aortic Valve: The aortic valve is normal in structure. Aortic valve regurgitation is not visualized. Pulmonic Valve: The pulmonic valve was not well visualized. Pulmonic valve regurgitation is not visualized. Aorta: The aortic root is normal in size and structure. IAS/Shunts: The interatrial septum was not assessed.  LEFT VENTRICLE PLAX 2D LVIDd:         5.75 cm      Diastology LVIDs:         4.50 cm      LV e' medial:    6.09 cm/s LV PW:         0.85 cm      LV E/e' medial:  11.1 LV IVS:        1.10 cm      LV e' lateral:   8.49 cm/s LVOT diam:     2.30 cm      LV E/e' lateral: 7.9 LV SV:         99 LV SV Index:   43 LVOT Area:     4.15 cm  LV Volumes (MOD) LV vol d, MOD A2C: 124.9 ml LV vol d, MOD A4C: 190.5 ml LV vol s, MOD A2C: 69.4 ml LV vol s, MOD A4C: 89.6 ml LV SV MOD A2C:      55.6 ml LV SV MOD A4C:     190.5 ml LV SV MOD BP:      73.9 ml RIGHT VENTRICLE RV S prime:     10.80 cm/s TAPSE (M-mode): 1.8 cm LEFT ATRIUM             Index       RIGHT ATRIUM  Index LA diam:        4.30 cm 1.89 cm/m  RA Area:     10.70 cm LA Vol (A2C):   49.6 ml 21.79 ml/m RA Volume:   17.20 ml  7.56 ml/m LA Vol (A4C):   63.2 ml 27.76 ml/m LA Biplane Vol: 55.3 ml 24.29 ml/m  AORTIC VALVE LVOT Vmax:   134.50 cm/s LVOT Vmean:  83.550 cm/s LVOT VTI:    0.238 m  AORTA Ao Root diam: 3.80 cm MITRAL VALVE               TRICUSPID VALVE MV Area (PHT): 3.99 cm    TR Peak grad:   22.3 mmHg MV Decel Time: 190 msec    TR Vmax:        236.00 cm/s MV E velocity: 67.30 cm/s MV A velocity: 93.00 cm/s  SHUNTS MV E/A ratio:  0.72        Systemic VTI:  0.24 m                            Systemic Diam: 2.30 cm Dorris Carnes MD Electronically signed by Dorris Carnes MD Signature Date/Time: 04/02/2020/5:00:41 PM    Final     Anti-infectives: Anti-infectives (From admission, onward)   None      Assessment/Plan: S/p WLE/AFC for left arm melanoma 03/29/2020  Had chest pain and got echo yesterday.  Given continued pain, will plan to evacuate hematoma. Likely will not find time until tomorrow. Will update when this is scheduled.    LOS: 0 days    Stark Klein 04/03/2020

## 2020-04-03 NOTE — Evaluation (Signed)
Occupational Therapy Evaluation Patient Details Name: [name] MRN: [id number] DOB: Mar 13, 1934 Today's Date: 04/03/2020    History of Present Illness [name] is a 86 y.o. male who reports having chest pressure with associated nausea and a feeling of being light-headed, also had ongoing left shoulder pain postop (left shoulder melanoma removal a few days PTA).  He was admitted for chest pain, AKI and ongoing left shoulder discomfort.  PMH:  CAD, DM, HTN, recently had left shoulder melanoma surgery   Clinical Impression   PT is typically independent in ADL/IADL and mobility. 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 He also enjoys fishing. Today he was min guard for mobility with RW, min guard for standing ADL at sink, and min guard for donning socks. Pt with limited ROM and pain in LUE (shoulder) but anticipate that he will do well and not need OT post-acute unless something drastic happens with sx. OT will follow acutely for post-op as well as continuing to work with balance and ROM/function of LUE.    Follow Up Recommendations  No OT follow up;Other (comment) (follow after sx - with focus on LUE)    Equipment Recommendations  None recommended by OT (Pt has appropriate DME)    Recommendations for Other Services       Precautions / Restrictions Precautions Precautions: Fall Restrictions Weight Bearing Restrictions: No      Mobility Bed Mobility Overal bed mobility: Independent                  Transfers Overall transfer level: Needs assistance Equipment used: Rolling walker (2 wheeled) Transfers: Sit to/from Stand Sit to Stand: Min guard         General transfer comment: min guard for safety and vc for education safety with RW    Balance Overall balance assessment: Needs assistance Sitting-balance support: No upper extremity supported;Feet supported Sitting balance-Leahy Scale: Fair     Standing balance support: Bilateral upper  extremity supported;During functional activity Standing balance-Leahy Scale: Poor Standing balance comment: relies on UE support for balance for static stance and dynamic activity.                           ADL either performed or assessed with clinical judgement   ADL Overall ADL's : Needs assistance/impaired Eating/Feeding: Set up   Grooming: Wash/dry hands;Oral care;Supervision/safety;Standing Grooming Details (indicate cue type and reason): sink level, cues for safety with RW Upper Body Bathing: Min guard   Lower Body Bathing: Min guard;Sitting/lateral leans   Upper Body Dressing : Min guard;Sitting   Lower Body Dressing: Min guard;Sit to/from stand   Toilet Transfer: Min guard;Ambulation;RW Toilet Transfer Details (indicate cue type and reason): requires outside support from RW for balance at this time Toileting- Water quality scientist and Hygiene: Min guard;Sit to/from stand   Tub/ Shower Transfer: Min guard;Ambulation;Grab bars;Rolling walker   Functional mobility during ADLs: Min guard;Rolling walker General ADL Comments: LUE very edemous and sore- but surprisingly functional     Vision Baseline Vision/History: Wears glasses Wears Glasses: At all times Patient Visual Report: No change from baseline       Perception     Praxis      Pertinent Vitals/Pain Pain Assessment: Faces Faces Pain Scale: Hurts whole lot Pain Location: left shoulder Pain Descriptors / Indicators: Aching;Grimacing;Guarding;Discomfort Pain Intervention(s): Limited activity within patient's tolerance;Monitored during session     Hand Dominance Right   Extremity/Trunk Assessment Upper Extremity Assessment  Upper Extremity Assessment: LUE deficits/detail LUE Deficits / Details: bruising very apparent throughout LUE and down through ribs, bandaged and so limited ROM out of precautions as Pt is set to go back into sx LUE Coordination: decreased gross motor   Lower Extremity  Assessment Lower Extremity Assessment: Defer to PT evaluation   Cervical / Trunk Assessment Cervical / Trunk Assessment: Kyphotic   Communication Communication Communication: No difficulties   Cognition Arousal/Alertness: Awake/alert Behavior During Therapy: WFL for tasks assessed/performed Overall Cognitive Status: Within Functional Limits for tasks assessed                                     General Comments  Pt reports that he will be going in for I&D sx    Exercises     Shoulder Instructions      Home Living Family/patient expects to be discharged to:: Private residence Living Arrangements: Children Available Help at Discharge: Family;Available 24 hours/day (son works, sister close by) Type of Home: House Home Access: Stairs to enter Technical brewer of Steps: 3 Entrance Stairs-Rails: Right [place]: One level     Bathroom Shower/Tub: Teacher, early years/pre: [place]: Environmental consultant - 2 wheels;Cane - single point;Bedside commode;Grab bars - tub/shower   Additional Comments: Pt likes to mow lawns and work on [employer], also enjoys fishing      Prior Functioning/Environment Level of Independence: Independent        Comments: B/D self and didnt use equipment per pt        OT Problem List: Decreased range of motion;Decreased activity tolerance;Impaired balance (sitting and/or standing);Decreased knowledge of use of DME or AE;Pain;Impaired UE functional use;Increased edema      OT Treatment/Interventions: Self-care/ADL training;Therapeutic exercise;DME and/or AE instruction;Therapeutic activities;Patient/family education;Balance training    OT Goals(Current goals can be found in the care plan section) Acute Rehab OT Goals Patient Stated Goal: get LUE fixed and get home OT Goal Formulation: With patient Time For Goal Achievement: 04/17/20 Potential to Achieve Goals: Good  OT Frequency: Min 2X/week    Barriers to D/C:            Co-evaluation              AM-PAC OT "6 Clicks" Daily Activity     Outcome Measure Help from another person eating meals?: None Help from another person taking care of personal grooming?: A Little Help from another person toileting, which includes using toliet, bedpan, or urinal?: A Little Help from another person bathing (including washing, rinsing, drying)?: A Little Help from another person to put on and taking off regular upper body clothing?: A Little Help from another person to put on and taking off regular lower body clothing?: A Little 6 Click Score: 19   End of Session Equipment Utilized During Treatment: Rolling walker Nurse Communication: Mobility status;Precautions  Activity Tolerance: Patient tolerated treatment well Patient left: in bed;with call bell/phone within reach;Other (comment) (sitting EOB)  OT Visit Diagnosis: Other abnormalities of gait and mobility (R26.89);Muscle weakness (generalized) (M62.81);Pain Pain - Right/Left: Left Pain - part of body: Shoulder                Time: 7026-3785 OT Time Calculation (min): 25 min Charges:  OT General Charges $OT Visit: 1 Visit OT Evaluation $OT Eval Moderate Complexity: 1 Mod OT Treatments $Self Care/Home Management : 8-22 mins  Mickel Baas  H OTR/L Acute Rehabilitation Services Pager: [phone] Office: [place] 04/03/2020, 1:19 PM

## 2020-04-03 NOTE — TOC Progression Note (Addendum)
Transition of Care [facility]) - Progression Note    Patient Details  Name: [name] MRN: [id number] Date of Birth: 1934-03-01  Transition of Care [facility]) CM/SW Contact  Zenon Mayo, RN Phone Number: 04/03/2020, 12:41 PM  Clinical Narrative:    NCM spoke with patient, offered choice , he does not have a preference but would like HHPT, NCM made referral to [facility] with [facility], awaiting to hear back. Kenzie with [facility] states they can take referral for HHPT. Soc will begin 24 to 48 hrs post dc.         Expected Discharge Plan and Services                                                 Social Determinants of Health (SDOH) Interventions    Readmission Risk Interventions No flowsheet data found.

## 2020-04-03 NOTE — H&P (View-Only) (Signed)
Subjective/Chief Complaint: Still having excruciating pain.   Objective: Vital signs in last 24 hours: Temp:  [97.7 F (36.5 C)-98.5 F (36.9 C)] 97.7 F (36.5 C) (02/22 0300) Pulse Rate:  [71-89] 80 (02/22 0300) Resp:  [16-20] 20 (02/22 0300) BP: (139-153)/(53-86) 143/67 (02/22 0300) SpO2:  [90 %-93 %] 93 % (02/22 0300) Last BM Date: 04/02/20  Intake/Output from previous day: 02/21 0701 - 02/22 0700 In: 1403.4 [P.O.:480; I.V.:923.4] Out: -  Intake/Output this shift: No intake/output data recorded.  left arm wound tender, hematoma still tight, but less so.  Lab Results:  Recent Labs    04/02/20 0748 04/03/20 0127  WBC 9.6 7.9  HGB 13.6 12.1*  HCT 40.7 34.8*  PLT 321 296   BMET Recent Labs    04/02/20 0748 04/03/20 0127  NA 137 134*  K 3.5 3.5  CL 100 99  CO2 26 26  GLUCOSE 221* 296*  BUN 22 21  CREATININE 1.46* 1.24  CALCIUM 9.6 9.0   PT/INR No results for input(s): LABPROT, INR in the last 72 hours. ABG No results for input(s): PHART, HCO3 in the last 72 hours.  Invalid input(s): PCO2, PO2  Studies/Results: DG Chest 2 View  Result Date: 04/01/2020 CLINICAL DATA:  86 year old male with a history of shortness of breath EXAM: CHEST - 2 VIEW COMPARISON:  03/16/2020 FINDINGS: Cardiomediastinal silhouette unchanged in size and contour. No central vascular congestion. No interlobular septal thickening. No pneumothorax. No pleural effusion. No confluent airspace disease. Degenerative changes of the spine.  No displaced fracture IMPRESSION: Chronic lung changes without evidence of acute cardiopulmonary disease. Electronically Signed   By: Corrie Mckusick D.O.   On: 04/01/2020 10:53   DG Chest Port 1 View  Result Date: 04/02/2020 CLINICAL DATA:  Shortness of breath. EXAM: PORTABLE CHEST 1 VIEW COMPARISON:  04/01/2020 FINDINGS: Patient is partially rotated to the left. Heart size is unchanged given patient positioning. Aortic atherosclerotic calcification  noted. Both lungs are clear. Surgical clips again noted in left axilla. IMPRESSION: No active disease. Electronically Signed   By: Marlaine Hind M.D.   On: 04/02/2020 08:10   ECHOCARDIOGRAM COMPLETE  Result Date: 04/02/2020    ECHOCARDIOGRAM REPORT   Patient Name:   [name] Date of Exam: 04/02/2020 Medical Rec #:  [id number]       Height:       72.0 in Accession #:    [id number]      Weight:       233.7 lb Date of Birth:  [birth year]      BSA:          2.276 m Patient Age:    86 years        BP:           132/67 mmHg Patient Gender: M               HR:           84 bpm. Exam Location:  Inpatient Procedure: 2D Echo, Cardiac Doppler, Color Doppler and Intracardiac            Opacification Agent Indications:    Elevated Troponin  History:        Patient has prior history of Echocardiogram examinations, most                 recent 01/27/2014. CHF, Arrythmias:PVC, Signs/Symptoms:Chest                 Pain; Risk Factors:Hypertension and Dyslipidemia.  Sonographer:    Darlina Sicilian RDCS Referring Phys: [phone] TIFFANY [place] IMPRESSIONS  1. COmpared to previous report LVEF is mildly worse.  2. LVEF is depressed with severe hypokinesis of the inferiorl hyopokinesis of the base/mid inferoseptal wall. Left ventricular ejection fraction, by estimation, is 40 to 45%. The left ventricle has mildly decreased function. The left ventricular internal cavity size was moderately dilated. Left ventricular diastolic parameters are consistent with Grade I diastolic dysfunction (impaired relaxation).  3. Right ventricular systolic function is normal. The right ventricular size is normal. There is normal pulmonary artery systolic pressure.  4. The mitral valve is grossly normal. Trivial mitral valve regurgitation.  5. The aortic valve is normal in structure. Aortic valve regurgitation is not visualized. FINDINGS  Left Ventricle: LVEF is depressed with severe hypokinesis of the inferiorl hyopokinesis of the base/mid inferoseptal  wall. Left ventricular ejection fraction, by estimation, is 40 to 45%. The left ventricle has mildly decreased function. Definity contrast agent was given IV to delineate the left ventricular endocardial borders. The left ventricular internal cavity size was moderately dilated. There is no left ventricular hypertrophy. Left ventricular diastolic parameters are consistent with Grade  I diastolic dysfunction (impaired relaxation). Right Ventricle: The right ventricular size is normal. Right vetricular wall thickness was not assessed. Right ventricular systolic function is normal. There is normal pulmonary artery systolic pressure. The tricuspid regurgitant velocity is 2.36 m/s, and with an assumed right atrial pressure of 3 mmHg, the estimated right ventricular systolic pressure is 83.3 mmHg. Left Atrium: Left atrial size was normal in size. Right Atrium: Right atrial size was normal in size. Pericardium: There is no evidence of pericardial effusion. Mitral Valve: The mitral valve is grossly normal. Trivial mitral valve regurgitation. Tricuspid Valve: The tricuspid valve is normal in structure. Tricuspid valve regurgitation is trivial. Aortic Valve: The aortic valve is normal in structure. Aortic valve regurgitation is not visualized. Pulmonic Valve: The pulmonic valve was not well visualized. Pulmonic valve regurgitation is not visualized. Aorta: The aortic root is normal in size and structure. IAS/Shunts: The interatrial septum was not assessed.  LEFT VENTRICLE PLAX 2D LVIDd:         5.75 cm      Diastology LVIDs:         4.50 cm      LV e' medial:    6.09 cm/s LV PW:         0.85 cm      LV E/e' medial:  11.1 LV IVS:        1.10 cm      LV e' lateral:   8.49 cm/s LVOT diam:     2.30 cm      LV E/e' lateral: 7.9 LV SV:         99 LV SV Index:   43 LVOT Area:     4.15 cm  LV Volumes (MOD) LV vol d, MOD A2C: 124.9 ml LV vol d, MOD A4C: 190.5 ml LV vol s, MOD A2C: 69.4 ml LV vol s, MOD A4C: 89.6 ml LV SV MOD A2C:      55.6 ml LV SV MOD A4C:     190.5 ml LV SV MOD BP:      73.9 ml RIGHT VENTRICLE RV S prime:     10.80 cm/s TAPSE (M-mode): 1.8 cm LEFT ATRIUM             Index       RIGHT ATRIUM  Index LA diam:        4.30 cm 1.89 cm/m  RA Area:     10.70 cm LA Vol (A2C):   49.6 ml 21.79 ml/m RA Volume:   17.20 ml  7.56 ml/m LA Vol (A4C):   63.2 ml 27.76 ml/m LA Biplane Vol: 55.3 ml 24.29 ml/m  AORTIC VALVE LVOT Vmax:   134.50 cm/s LVOT Vmean:  83.550 cm/s LVOT VTI:    0.238 m  AORTA Ao Root diam: 3.80 cm MITRAL VALVE               TRICUSPID VALVE MV Area (PHT): 3.99 cm    TR Peak grad:   22.3 mmHg MV Decel Time: 190 msec    TR Vmax:        236.00 cm/s MV E velocity: 67.30 cm/s MV A velocity: 93.00 cm/s  SHUNTS MV E/A ratio:  0.72        Systemic VTI:  0.24 m                            Systemic Diam: 2.30 cm Dorris Carnes MD Electronically signed by Dorris Carnes MD Signature Date/Time: 04/02/2020/5:00:41 PM    Final     Anti-infectives: Anti-infectives (From admission, onward)   None      Assessment/Plan: S/p WLE/AFC for left arm melanoma 03/29/2020  Had chest pain and got echo yesterday.  Given continued pain, will plan to evacuate hematoma. Likely will not find time until tomorrow. Will update when this is scheduled.    LOS: 0 days    Stark Klein 04/03/2020

## 2020-04-03 NOTE — Progress Notes (Signed)
Progress Note  Patient Name: [name] Date of Encounter: 04/03/2020  [facility] Cardiologist: Candee Furbish, MD   Subjective   Feeling well.  Denies any recurrent chest pain.  Ambulated with physical therapy without any difficulty.  Inpatient Medications    Scheduled Meds: . amLODipine  10 mg Oral q morning  . aspirin EC  81 mg Oral QPM  . atorvastatin  40 mg Oral Daily  . carvedilol  12.5 mg Oral BID WC  . heparin  5,000 Units Subcutaneous Q8H  . hydrALAZINE  50 mg Oral TID  . insulin aspart  0-15 Units Subcutaneous TID WC  . insulin glargine  6 Units Subcutaneous QHS  . pantoprazole  40 mg Oral Daily  . [START ON 04/04/2020] potassium chloride SA  20 mEq Oral QPM  . potassium chloride  40 mEq Oral Once  . tamsulosin  0.4 mg Oral QPC supper   Continuous Infusions: . sodium chloride 10 mL/hr at 04/01/20 1500  . magnesium sulfate bolus IVPB     PRN Meds: acetaminophen, ALPRAZolam, nitroGLYCERIN, ondansetron (ZOFRAN) IV, traMADol   Vital Signs    Vitals:   04/02/20 2017 04/02/20 2330 04/03/20 0300 04/03/20 0755  BP: 139/66 (!) 144/58 (!) 143/67 (!) 156/79  Pulse: 88 72 80   Resp: 20 16 20 17   Temp: 98.5 F (36.9 C) 97.7 F (36.5 C) 97.7 F (36.5 C) 98 F (36.7 C)  TempSrc: Oral Oral Oral Oral  SpO2: 93% 90% 93%   Weight:      Height:        Intake/Output Summary (Last 24 hours) at 04/03/2020 0836 Last data filed at 04/02/2020 2300 Gross per 24 hour  Intake 1403.43 ml  Output -  Net 1403.43 ml   Last 3 Weights 04/01/2020 03/29/2020 03/28/2020  Weight (lbs) 233 lb 11 oz 233 lb 233 lb  Weight (kg) 106 kg 105.688 kg 105.688 kg      Telemetry    Sinus rhythm.  PVCs and PACs- Personally Reviewed  ECG    04/01/2020: Sinus rhythm.  PACs.  Poor R wave progression.  Possible LVH. - Personally Reviewed  Physical Exam   VS:  BP (!) 156/79 (BP Location: Right Arm)   Pulse 80   Temp 98 F (36.7 C) (Oral)   Resp 17   Ht 6' (1.829 m)   Wt 106 kg    SpO2 93%   BMI 31.69 kg/m  , BMI Body mass index is 31.69 kg/m. GENERAL:  Well appearing HEENT: Pupils equal round and reactive, fundi not visualized, oral mucosa unremarkable NECK:  No jugular venous distention, waveform within normal limits, carotid upstroke brisk and symmetric LUNGS: Clear to auscultation bilaterally HEART:  RRR.  PMI not displaced or sustained,S1 and S2 within normal limits, no S3, no S4, no clicks, no rubs, no murmurs ABD:  Flat, positive bowel sounds normal in frequency in pitch, no bruits, no rebound, no guarding, no midline pulsatile mass, no hepatomegaly, no splenomegaly EXT:  2 plus pulses throughout, trace edema, no cyanosis no clubbing SKIN:  No rashes no nodules NEURO:  Cranial nerves II through XII grossly intact, motor grossly intact throughout PSYCH:  Cognitively intact, oriented to person place and time   Labs    High Sensitivity Troponin:   Recent Labs  Lab 04/01/20 1058 04/01/20 1155 04/01/20 1458 04/01/20 1856  TROPONINIHS 24* 19* 24* 27*      Chemistry Recent Labs  Lab 04/01/20 1058 04/01/20 1155 04/02/20 0748 04/03/20 0127  NA 138  --  137 134*  K 3.6  --  3.5 3.5  CL 100  --  100 99  CO2 27  --  26 26  GLUCOSE 208*  --  221* 296*  BUN 17  --  22 21  CREATININE 1.09  --  1.46* 1.24  CALCIUM 9.8  --  9.6 9.0  PROT  --  6.2* 6.1* 5.7*  ALBUMIN  --  3.5 3.4* 3.2*  AST  --  15 15 14*  ALT  --  15 15 15   ALKPHOS  --  59 68 61  BILITOT  --  0.5 0.9 0.8  GFRNONAA >60  --  47* 58*  ANIONGAP 11  --  11 9     Hematology Recent Labs  Lab 04/01/20 1058 04/02/20 0748 04/03/20 0127  WBC 9.1 9.6 7.9  RBC 4.75 4.40 3.80*  HGB 14.5 13.6 12.1*  HCT 44.8 40.7 34.8*  MCV 94.3 92.5 91.6  MCH 30.5 30.9 31.8  MCHC 32.4 33.4 34.8  RDW 13.6 13.5 13.6  PLT 317 321 296    BNP Recent Labs  Lab 04/02/20 0748 04/03/20 0127  BNP 143.9* 131.2*     DDimer  Recent Labs  Lab 04/01/20 1155 04/02/20 0748 04/03/20 0127  DDIMER 0.42  0.58* 0.50     Radiology    DG Chest 2 View  Result Date: 04/01/2020 CLINICAL DATA:  86 year old male with a history of shortness of breath EXAM: CHEST - 2 VIEW COMPARISON:  03/16/2020 FINDINGS: Cardiomediastinal silhouette unchanged in size and contour. No central vascular congestion. No interlobular septal thickening. No pneumothorax. No pleural effusion. No confluent airspace disease. Degenerative changes of the spine.  No displaced fracture IMPRESSION: Chronic lung changes without evidence of acute cardiopulmonary disease. Electronically Signed   By: Corrie Mckusick D.O.   On: 04/01/2020 10:53   DG Chest Port 1 View  Result Date: 04/02/2020 CLINICAL DATA:  Shortness of breath. EXAM: PORTABLE CHEST 1 VIEW COMPARISON:  04/01/2020 FINDINGS: Patient is partially rotated to the left. Heart size is unchanged given patient positioning. Aortic atherosclerotic calcification noted. Both lungs are clear. Surgical clips again noted in left axilla. IMPRESSION: No active disease. Electronically Signed   By: Marlaine Hind M.D.   On: 04/02/2020 08:10   ECHOCARDIOGRAM COMPLETE  Result Date: 04/02/2020    ECHOCARDIOGRAM REPORT   Patient Name:   [name] Date of Exam: 04/02/2020 Medical Rec #:  [id number]       Height:       72.0 in Accession #:    [id number]      Weight:       233.7 lb Date of Birth:  01-18-1935      BSA:          2.276 m Patient Age:    85 years        BP:           132/67 mmHg Patient Gender: M               HR:           84 bpm. Exam Location:  Inpatient Procedure: 2D Echo, Cardiac Doppler, Color Doppler and Intracardiac            Opacification Agent Indications:    Elevated Troponin  History:        Patient has prior history of Echocardiogram examinations, most  recent 01/27/2014. CHF, Arrythmias:PVC, Signs/Symptoms:Chest                 Pain; Risk Factors:Hypertension and Dyslipidemia.  Sonographer:    Darlina Sicilian RDCS Referring Phys: [id number] Yer [place]ello [place] IMPRESSIONS   1. COmpared to previous report LVEF is mildly worse.  2. LVEF is depressed with severe hypokinesis of the inferiorl hyopokinesis of the base/mid inferoseptal wall. Left ventricular ejection fraction, by estimation, is 40 to 45%. The left ventricle has mildly decreased function. The left ventricular internal cavity size was moderately dilated. Left ventricular diastolic parameters are consistent with Grade I diastolic dysfunction (impaired relaxation).  3. Right ventricular systolic function is normal. The right ventricular size is normal. There is normal pulmonary artery systolic pressure.  4. The mitral valve is grossly normal. Trivial mitral valve regurgitation.  5. The aortic valve is normal in structure. Aortic valve regurgitation is not visualized. FINDINGS  Left Ventricle: LVEF is depressed with severe hypokinesis of the inferiorl hyopokinesis of the base/mid inferoseptal wall. Left ventricular ejection fraction, by estimation, is 40 to 45%. The left ventricle has mildly decreased function. Definity contrast agent was given IV to delineate the left ventricular endocardial borders. The left ventricular internal cavity size was moderately dilated. There is no left ventricular hypertrophy. Left ventricular diastolic parameters are consistent with Grade  I diastolic dysfunction (impaired relaxation). Right Ventricle: The right ventricular size is normal. Right vetricular wall thickness was not assessed. Right ventricular systolic function is normal. There is normal pulmonary artery systolic pressure. The tricuspid regurgitant velocity is 2.36 m/s, and with an assumed right atrial pressure of 3 mmHg, the estimated right ventricular systolic pressure is 25.8 mmHg. Left Atrium: Left atrial size was normal in size. Right Atrium: Right atrial size was normal in size. Pericardium: There is no evidence of pericardial effusion. Mitral Valve: The mitral valve is grossly normal. Trivial mitral valve regurgitation. Tricuspid  Valve: The tricuspid valve is normal in structure. Tricuspid valve regurgitation is trivial. Aortic Valve: The aortic valve is normal in structure. Aortic valve regurgitation is not visualized. Pulmonic Valve: The pulmonic valve was not well visualized. Pulmonic valve regurgitation is not visualized. Aorta: The aortic root is normal in size and structure. IAS/Shunts: The interatrial septum was not assessed.  LEFT VENTRICLE PLAX 2D LVIDd:         5.75 cm      Diastology LVIDs:         4.50 cm      LV e' medial:    6.09 cm/s LV PW:         0.85 cm      LV E/e' medial:  11.1 LV IVS:        1.10 cm      LV e' lateral:   8.49 cm/s LVOT diam:     2.30 cm      LV E/e' lateral: 7.9 LV SV:         99 LV SV Index:   43 LVOT Area:     4.15 cm  LV Volumes (MOD) LV vol d, MOD A2C: 124.9 ml LV vol d, MOD A4C: 190.5 ml LV vol s, MOD A2C: 69.4 ml LV vol s, MOD A4C: 89.6 ml LV SV MOD A2C:     55.6 ml LV SV MOD A4C:     190.5 ml LV SV MOD BP:      73.9 ml RIGHT VENTRICLE RV S prime:     10.80 cm/s TAPSE (M-mode): 1.8 cm LEFT ATRIUM  Index       RIGHT ATRIUM           Index LA diam:        4.30 cm 1.89 cm/m  RA Area:     10.70 cm LA Vol (A2C):   49.6 ml 21.79 ml/m RA Volume:   17.20 ml  7.56 ml/m LA Vol (A4C):   63.2 ml 27.76 ml/m LA Biplane Vol: 55.3 ml 24.29 ml/m  AORTIC VALVE LVOT Vmax:   134.50 cm/s LVOT Vmean:  83.550 cm/s LVOT VTI:    0.238 m  AORTA Ao Root diam: 3.80 cm MITRAL VALVE               TRICUSPID VALVE MV Area (PHT): 3.99 cm    TR Peak grad:   22.3 mmHg MV Decel Time: 190 msec    TR Vmax:        236.00 cm/s MV E velocity: 67.30 cm/s MV A velocity: 93.00 cm/s  SHUNTS MV E/A ratio:  0.72        Systemic VTI:  0.24 m                            Systemic Diam: 2.30 cm Dorris Carnes MD Electronically signed by Dorris Carnes MD Signature Date/Time: 04/02/2020/5:00:41 PM    Final     Cardiac Studies   Echo 01/27/14: Study Conclusions   - Left ventricle: The cavity size was mildly dilated. Wall   thickness was increased in a pattern of mild LVH. Systolic  function was mildly reduced. The estimated ejection fraction was  in the range of 45% to 50%. Doppler parameters are consistent  with abnormal left ventricular relaxation (grade 1 diastolic  dysfunction).  - Left atrium: The atrium was mildly dilated.  - Right ventricle: The cavity size was mildly dilated.  - Right atrium: The atrium was mildly dilated.  - Pulmonary arteries: PA peak pressure: 32 mm Hg (S).   Echo 04/02/20: 1. COmpared to previous report LVEF is mildly worse.  2. LVEF is depressed with severe hypokinesis of the inferiorl  hyopokinesis of the base/mid inferoseptal wall. Left ventricular ejection  fraction, by estimation, is 40 to 45%. The left ventricle has mildly  decreased function. The left ventricular  internal cavity size was moderately dilated. Left ventricular diastolic  parameters are consistent with Grade I diastolic dysfunction (impaired  relaxation).  3. Right ventricular systolic function is normal. The right ventricular  size is normal. There is normal pulmonary artery systolic pressure.  4. The mitral valve is grossly normal. Trivial mitral valve  regurgitation.  5. The aortic valve is normal in structure. Aortic valve regurgitation is  not visualized.   Patient Profile     86 y.o. male with chronic systolic and diastolic heart failure, hypertension, diabetes, hyperlipidemia and melanoma admitted with nausea, vomiting, and chest pain.  Assessment & Plan    # Chest pain: # Elevated troponin: # Hyperlipidemia: Patient admitted with nausea and vomiting as well as chest pain.  Cardiac enzymes were mildly elevated and flat.  Overall thought to be more related to demand ischemia than obstructive coronary disease.  Echo essentially unchanged from prior.  Prior cath in 2004 was normal, nuclear stress test low risk in 2014.  Continue aspirin, carvedilol and atorvastatin.  He is at  acceptable risk for surgery.  # Chronic systolic diastolic heart failure: # Hypertensive urgency: LVEF was 35 to 40% in 2014 and 45 to  50% on echo in 2015.  Repeat echo this admission was essentially unchanged, to mildly worse.  LVEF was 40 to 45%.  He has known nonischemic cardiomyopathy.  He is euvolemic and has no exertional chest pain.  No plans for ischemic reevaluation as above.  Blood pressure remains poorly controlled.  Resume home HCTZ.  His home lisinopril has been on hold.  Renal function was little worse yesterday.  Continue to monitor closely.  Continue amlodipine, carvedilol, and hydralazine.      For questions or updates, please contact [place] Please consult [email or web address] for contact info under        Signed, Skeet Latch, MD  04/03/2020, 8:36 AM

## 2020-04-03 NOTE — Progress Notes (Signed)
Inpatient Diabetes Program Recommendations  AACE/ADA: New Consensus Statement on Inpatient Glycemic Control (2015)  Target Ranges:  Prepandial:   less than 140 mg/dL      Peak postprandial:   less than 180 mg/dL (1-2 hours)      Critically ill patients:  140 - 180 mg/dL   Lab Results  Component Value Date   GLUCAP 223 (H) 04/03/2020   HGBA1C 8.6 (H) 04/01/2020    Review of Glycemic Control Results for [name], [name] (MRN [id number]) as of 04/03/2020 09:20  Ref. Range 04/02/2020 06:35 04/02/2020 10:58 04/02/2020 16:40 04/02/2020 21:19 04/03/2020 05:27  Glucose-Capillary Latest Ref Range: 70 - 99 mg/dL 237 (H) 328 (H) 248 (H) 175 (H) 223 (H)   Diabetes history: DM2 Outpatient Diabetes medications: [name] 4-8 units hs + Amaryl 2 mg + Humalog 4-10 units tid meal coverage Current orders for Inpatient glycemic control: Lantus 6 units qhs + Novolog 0-15 units tid correction  Inpatient Diabetes Program Recommendations:   -Increase Lantus to 8 units q hs -Novolog 3 units tid meal coverage if eats 50%  Thank you, Bethena Roys E. Alecia Doi, RN, MSN, CDE  Diabetes Coordinator Inpatient Glycemic Control Team Team Pager [phone] (8am-5pm) 04/03/2020 10:37 AM

## 2020-04-03 NOTE — Plan of Care (Signed)

## 2020-04-03 NOTE — Progress Notes (Signed)
PROGRESS NOTE                                                                                                                                                                                                             Patient Demographics:    [name], is a 86 y.o. male, DOB - Mar 26, 1934, PFY:[id number]  Outpatient Primary MD for the patient is Tonia Ghent, MD    LOS - 0  Admit date - 04/01/2020    Chief Complaint  Patient presents with  . Chest Pain       Brief Narrative (HPI from H&P) - [name] is a 86 y.o. male with medical history significant of h/o CAD with last myoview 2014 read as low risk, DM, HTN, recently had left shoulder melanoma surgery by Dr. Barry Dienes few days prior to hospital visit, who reports having chest pressure at 0800 hrs with associated nausea and a feeling of being light-headed, also had ongoing left shoulder pain postop.  He was admitted for chest pain, AKI and ongoing left shoulder discomfort.  He was seen by cardiology and general surgery, surgery planning to take him for evacuation of hematoma from the left shoulder site on 04/03/2020.   Subjective:    [name] today in bed in no distress complains of mild substernal chest pain and left shoulder discomfort, no abdominal pain, no shortness of breath, no focal deficits.   Assessment  & Plan :    1.  Chest pain in a patient with history of CAD. He had non-ACS pattern troponin rise, currently on aspirin, Coreg and statin for secondary prevention.  Now pain and symptom-free, cardiology on board, shows a EF of 40% with wall motion abnormality, defer further management to cardiology who are on board, for now will monitor.   2. Abdominal pain.  Completely resolved will place on PPI and monitor.  Lipase is stable.  Exam unremarkable  3. Dyslipidemia.  Continue statin at home dose.  4. Hypertension.  On Coreg and Norvasc combination  continue to monitor.  5. BPH.  Continue Flomax.  6.  Recent left shoulder melanoma surgery by Dr. Barry Dienes, some pain and discomfort at the site of surgery needs repeat evacuation, cardiology has cleared the patient to proceed.  7.  AKI.  Solved after IV fluids and hydration.  8. Hypomagnesemia.  Replaced IV.    9. DM Type II.  On Lantus with sliding scale.  Lab Results  Component Value Date   HGBA1C 8.6 (H) 04/01/2020   CBG (last 3)  Recent Labs    04/02/20 2119 04/03/20 0527 04/03/20 1149  GLUCAP [place]*          Condition - Fair  Family Communication  :  Azzie Glatter [phone] - 04/02/2020, 04/03/2020.  Code Status :  DNR  Consults  :  Cards, CCS  PUD Prophylaxis : PPI   Procedures  :      TTE - 1. Compared to previous report LVEF is mildly worse.  2. LVEF is depressed with severe hypokinesis of the inferiorl hyopokinesis of the base/mid inferoseptal wall. Left ventricular ejection fraction, by estimation, is 40 to 45%. The left ventricle has mildly decreased function. The left ventricular internal cavity size was moderately dilated. Left ventricular diastolic parameters are consistent with Grade I diastolic dysfunction (impaired relaxation).  3. Right ventricular systolic function is normal. The right ventricular size is normal. There is normal pulmonary artery systolic pressure.  4. The mitral valve is grossly normal. Trivial mitral valve regurgitation.  5. The aortic valve is normal in structure. Aortic valve regurgitation is not visualized      Disposition Plan  :    Status is: Observation  Dispo: The patient is from: Home              Anticipated d/c is to: Home              Anticipated d/c date is: 3 days              Patient currently is not medically stable to d/c.   Difficult to place patient No   DVT Prophylaxis  :   Heparin    Lab Results  Component Value Date   PLT 296 04/03/2020    Diet :  Diet Order            Diet heart  healthy/carb modified Room service appropriate? Yes; Fluid consistency: Thin  Diet effective now                  Inpatient Medications  Scheduled Meds: . amLODipine  10 mg Oral q morning  . aspirin EC  81 mg Oral QPM  . atorvastatin  40 mg Oral Daily  . carvedilol  12.5 mg Oral BID WC  . heparin  5,000 Units Subcutaneous Q8H  . hydrALAZINE  50 mg Oral TID  . insulin aspart  0-15 Units Subcutaneous TID WC  . insulin glargine  6 Units Subcutaneous QHS  . pantoprazole  40 mg Oral Daily  . [START ON 04/04/2020] potassium chloride SA  20 mEq Oral QPM  . tamsulosin  0.4 mg Oral QPC supper   Continuous Infusions: . sodium chloride 10 mL/hr at 04/01/20 1500   PRN Meds:.acetaminophen, ALPRAZolam, nitroGLYCERIN, ondansetron (ZOFRAN) IV, traMADol  Antibiotics  :    Anti-infectives (From admission, onward)   None       Time Spent in minutes  30   Lala Lund M.D on 04/03/2020 at 12:41 PM  To page go to [email or web address]   [facility] -  Office  [phone]   See all Orders from today for further details    Objective:   Vitals:   04/03/20 0300 04/03/20 0755 04/03/20 0848 04/03/20 1147  BP: (!) 143/67 (!) 156/79 (!) 156/79 (!) 141/73  Pulse: 80  78  Resp: 20 17  20   Temp: 97.7 F (36.5 C) 98 F (36.7 C)  98.1 F (36.7 C)  TempSrc: Oral Oral  Oral  SpO2: 93%     Weight:      Height:        Wt Readings from Last 3 Encounters:  04/01/20 106 kg  03/29/20 105.7 kg  01/03/20 106.1 kg     Intake/Output Summary (Last 24 hours) at 04/03/2020 1241 Last data filed at 04/02/2020 2300 Gross per 24 hour  Intake 1403.43 ml  Output --  Net 1403.43 ml     Physical Exam  Awake Alert, No new F.N deficits, Normal affect [place].AT,PERRAL Supple Neck,No JVD, No cervical lymphadenopathy appriciated.  Symmetrical Chest wall movement, Good air movement bilaterally, CTAB RRR,No Gallops, Rubs or new Murmurs, No Parasternal Heave +ve B.Sounds, Abd Soft, No tenderness, No  organomegaly appriciated, No rebound - guarding or rigidity. left shoulder large bandage in place at the site of surgery, some hematoma tracking down,    Data Review:    CBC Recent Labs  Lab 03/29/20 0604 04/01/20 1058 04/02/20 0748 04/03/20 0127  WBC 8.1 9.1 9.6 7.9  HGB 15.2 14.5 13.6 12.1*  HCT 44.2 44.8 40.7 34.8*  PLT 287 317 321 296  MCV 93.1 94.3 92.5 91.6  MCH 32.0 30.5 30.9 31.8  MCHC 34.4 32.4 33.4 34.8  RDW 13.7 13.6 13.5 13.6  LYMPHSABS  --   --   --  2.6  MONOABS  --   --   --  0.9  EOSABS  --   --   --  0.4  BASOSABS  --   --   --  0.0    Recent Labs  Lab 03/29/20 0604 04/01/20 1058 04/01/20 1155 04/01/20 1458 04/02/20 0748 04/03/20 0127  NA 140 138  --   --  137 134*  K 3.4* 3.6  --   --  3.5 3.5  CL 104 100  --   --  100 99  CO2 23 27  --   --  26 26  GLUCOSE 228* 208*  --   --  221* 296*  BUN 25* 17  --   --  22 21  CREATININE 1.18 1.09  --   --  1.46* 1.24  CALCIUM 9.7 9.8  --   --  9.6 9.0  AST  --   --  15  --  15 14*  ALT  --   --  15  --  15 15  ALKPHOS  --   --  59  --  68 61  BILITOT  --   --  0.5  --  0.9 0.8  ALBUMIN  --   --  3.5  --  3.4* 3.2*  MG  --   --   --   --  1.7 1.6*  CRP  --   --   --   --  1.5* 2.6*  DDIMER  --   --  0.42  --  0.58* 0.50  HGBA1C  --   --   --  8.6*  --   --   BNP  --   --   --   --  143.9* 131.2*    ------------------------------------------------------------------------------------------------------------------ Recent Labs    04/02/20 0029  CHOL 147  HDL 30*  LDLCALC 80  TRIG 185*  CHOLHDL 4.9    Lab Results  Component Value Date   HGBA1C 8.6 (H) 04/01/2020   ------------------------------------------------------------------------------------------------------------------ No results for  input(s): TSH, T4TOTAL, T3FREE, THYROIDAB in the last 72 hours.  Invalid input(s): FREET3  Cardiac Enzymes No results for input(s): CKMB, TROPONINI, MYOGLOBIN in the last 168 hours.  Invalid  input(s): CK ------------------------------------------------------------------------------------------------------------------    Component Value Date/Time   BNP 131.2 (H) 04/03/2020 0127    Micro Results Recent Results (from the past 240 hour(s))  SARS CORONAVIRUS 2 (TAT 6-24 HRS) Nasopharyngeal Nasopharyngeal Swab     Status: None   Collection Time: 03/26/20  9:01 AM   Specimen: Nasopharyngeal Swab  Result Value Ref Range Status   SARS Coronavirus 2 NEGATIVE NEGATIVE Final    Comment: (NOTE) SARS-CoV-2 target nucleic acids are NOT DETECTED.  The SARS-CoV-2 RNA is generally detectable in upper and lower respiratory specimens during the acute phase of infection. Negative results do not preclude SARS-CoV-2 infection, do not rule out co-infections with other pathogens, and should not be used as the sole basis for treatment or other patient management decisions. Negative results must be combined with clinical observations, patient history, and epidemiological information. The expected result is Negative.  Fact Sheet for Patients: SugarRoll.be  Fact Sheet for Healthcare Providers: https://www.woods-mathews.com/  This test is not yet approved or cleared by the [place] FDA and  has been authorized for detection and/or diagnosis of SARS-CoV-2 by FDA under an Emergency Use Authorization (EUA). This EUA will remain  in effect (meaning this test can be used) for the duration of the COVID-19 declaration under Se ction 564(b)(1) of the Act, 21 U.S.C. section 360bbb-3(b)(1), unless the authorization is terminated or revoked sooner.  Performed at [facility], [place] [address], [place], [place] [address]   Resp Panel by RT-PCR (Flu A&B, Covid) Nasopharyngeal Swab     Status: None   Collection Time: 04/01/20 11:55 AM   Specimen: Nasopharyngeal Swab; Nasopharyngeal(NP) swabs in vial transport medium  Result Value Ref Range Status    SARS Coronavirus 2 by RT PCR NEGATIVE NEGATIVE Final    Comment: (NOTE) SARS-CoV-2 target nucleic acids are NOT DETECTED.  The SARS-CoV-2 RNA is generally detectable in upper respiratory specimens during the acute phase of infection. The lowest concentration of SARS-CoV-2 viral copies this assay can detect is 138 copies/mL. A negative result does not preclude SARS-Cov-2 infection and should not be used as the sole basis for treatment or other patient management decisions. A negative result may occur with  improper specimen collection/handling, submission of specimen other than nasopharyngeal swab, presence of viral mutation(s) within the areas targeted by this assay, and inadequate number of viral copies(<138 copies/mL). A negative result must be combined with clinical observations, patient history, and epidemiological information. The expected result is Negative.  Fact Sheet for Patients:  EntrepreneurPulse.com.au  Fact Sheet for Healthcare Providers:  IncredibleEmployment.be  This test is no t yet approved or cleared by the [place] FDA and  has been authorized for detection and/or diagnosis of SARS-CoV-2 by FDA under an Emergency Use Authorization (EUA). This EUA will remain  in effect (meaning this test can be used) for the duration of the COVID-19 declaration under Section 564(b)(1) of the Act, 21 U.S.C.section 360bbb-3(b)(1), unless the authorization is terminated  or revoked sooner.       Influenza A by PCR NEGATIVE NEGATIVE Final   Influenza B by PCR NEGATIVE NEGATIVE Final    Comment: (NOTE) The Xpert Xpress SARS-CoV-2/FLU/RSV plus assay is intended as an aid in the diagnosis of influenza from Nasopharyngeal swab specimens and should not be used as a sole basis for  treatment. Nasal washings and aspirates are unacceptable for Xpert Xpress SARS-CoV-2/FLU/RSV testing.  Fact Sheet for  Patients: EntrepreneurPulse.com.au  Fact Sheet for Healthcare Providers: IncredibleEmployment.be  This test is not yet approved or cleared by the [place] FDA and has been authorized for detection and/or diagnosis of SARS-CoV-2 by FDA under an Emergency Use Authorization (EUA). This EUA will remain in effect (meaning this test can be used) for the duration of the COVID-19 declaration under Section 564(b)(1) of the Act, 21 U.S.C. section 360bbb-3(b)(1), unless the authorization is terminated or revoked.  Performed at [facility], [place] [address], [place], [place] [address]     Radiology Reports DG Chest 2 View  Result Date: 04/01/2020 CLINICAL DATA:  86 year old male with a history of shortness of breath EXAM: CHEST - 2 VIEW COMPARISON:  03/16/2020 FINDINGS: Cardiomediastinal silhouette unchanged in size and contour. No central vascular congestion. No interlobular septal thickening. No pneumothorax. No pleural effusion. No confluent airspace disease. Degenerative changes of the spine.  No displaced fracture IMPRESSION: Chronic lung changes without evidence of acute cardiopulmonary disease. Electronically Signed   By: Corrie Mckusick D.O.   On: 04/01/2020 10:53   NM Sentinel Node Inj-No Rpt (Melanoma)  Result Date: 03/29/2020 Sulfur colloid was injected by the nuclear medicine technologist for melanoma sentinel node.   DG Chest Port 1 View  Result Date: 04/02/2020 CLINICAL DATA:  Shortness of breath. EXAM: PORTABLE CHEST 1 VIEW COMPARISON:  04/01/2020 FINDINGS: Patient is partially rotated to the left. Heart size is unchanged given patient positioning. Aortic atherosclerotic calcification noted. Both lungs are clear. Surgical clips again noted in left axilla. IMPRESSION: No active disease. Electronically Signed   By: Marlaine Hind M.D.   On: 04/02/2020 08:10   ECHOCARDIOGRAM COMPLETE  Result Date: 04/02/2020    ECHOCARDIOGRAM REPORT   Patient  Name:   TREVOR WILKIE Date of Exam: 04/02/2020 Medical Rec #:  [id number]       Height:       72.0 in Accession #:    [id number]      Weight:       233.7 lb Date of Birth:  01-Jul-1934      BSA:          2.276 m Patient Age:    60 years        BP:           132/67 mmHg Patient Gender: M               HR:           84 bpm. Exam Location:  Inpatient Procedure: 2D Echo, Cardiac Doppler, Color Doppler and Intracardiac            Opacification Agent Indications:    Elevated Troponin  History:        Patient has prior history of Echocardiogram examinations, most                 recent 01/27/2014. CHF, Arrythmias:PVC, Signs/Symptoms:Chest                 Pain; Risk Factors:Hypertension and Dyslipidemia.  Sonographer:    Darlina Sicilian RDCS Referring Phys: [phone] TIFFANY [place] IMPRESSIONS  1. COmpared to previous report LVEF is mildly worse.  2. LVEF is depressed with severe hypokinesis of the inferiorl hyopokinesis of the base/mid inferoseptal wall. Left ventricular ejection fraction, by estimation, is 40 to 45%. The left ventricle has mildly decreased function. The left ventricular internal cavity size was  moderately dilated. Left ventricular diastolic parameters are consistent with Grade I diastolic dysfunction (impaired relaxation).  3. Right ventricular systolic function is normal. The right ventricular size is normal. There is normal pulmonary artery systolic pressure.  4. The mitral valve is grossly normal. Trivial mitral valve regurgitation.  5. The aortic valve is normal in structure. Aortic valve regurgitation is not visualized. FINDINGS  Left Ventricle: LVEF is depressed with severe hypokinesis of the inferiorl hyopokinesis of the base/mid inferoseptal wall. Left ventricular ejection fraction, by estimation, is 40 to 45%. The left ventricle has mildly decreased function. Definity contrast agent was given IV to delineate the left ventricular endocardial borders. The left ventricular internal cavity size was  moderately dilated. There is no left ventricular hypertrophy. Left ventricular diastolic parameters are consistent with Grade  I diastolic dysfunction (impaired relaxation). Right Ventricle: The right ventricular size is normal. Right vetricular wall thickness was not assessed. Right ventricular systolic function is normal. There is normal pulmonary artery systolic pressure. The tricuspid regurgitant velocity is 2.36 m/s, and with an assumed right atrial pressure of 3 mmHg, the estimated right ventricular systolic pressure is 78.2 mmHg. Left Atrium: Left atrial size was normal in size. Right Atrium: Right atrial size was normal in size. Pericardium: There is no evidence of pericardial effusion. Mitral Valve: The mitral valve is grossly normal. Trivial mitral valve regurgitation. Tricuspid Valve: The tricuspid valve is normal in structure. Tricuspid valve regurgitation is trivial. Aortic Valve: The aortic valve is normal in structure. Aortic valve regurgitation is not visualized. Pulmonic Valve: The pulmonic valve was not well visualized. Pulmonic valve regurgitation is not visualized. Aorta: The aortic root is normal in size and structure. IAS/Shunts: The interatrial septum was not assessed.  LEFT VENTRICLE PLAX 2D LVIDd:         5.75 cm      Diastology LVIDs:         4.50 cm      LV e' medial:    6.09 cm/s LV PW:         0.85 cm      LV E/e' medial:  11.1 LV IVS:        1.10 cm      LV e' lateral:   8.49 cm/s LVOT diam:     2.30 cm      LV E/e' lateral: 7.9 LV SV:         99 LV SV Index:   43 LVOT Area:     4.15 cm  LV Volumes (MOD) LV vol d, MOD A2C: 124.9 ml LV vol d, MOD A4C: 190.5 ml LV vol s, MOD A2C: 69.4 ml LV vol s, MOD A4C: 89.6 ml LV SV MOD A2C:     55.6 ml LV SV MOD A4C:     190.5 ml LV SV MOD BP:      73.9 ml RIGHT VENTRICLE RV S prime:     10.80 cm/s TAPSE (M-mode): 1.8 cm LEFT ATRIUM             Index       RIGHT ATRIUM           Index LA diam:        4.30 cm 1.89 cm/m  RA Area:     10.70 cm LA Vol  (A2C):   49.6 ml 21.79 ml/m RA Volume:   17.20 ml  7.56 ml/m LA Vol (A4C):   63.2 ml 27.76 ml/m LA Biplane Vol: 55.3 ml 24.29 ml/m  AORTIC VALVE  LVOT Vmax:   134.50 cm/s LVOT Vmean:  83.550 cm/s LVOT VTI:    0.238 m  AORTA Ao Root diam: 3.80 cm MITRAL VALVE               TRICUSPID VALVE MV Area (PHT): 3.99 cm    TR Peak grad:   22.3 mmHg MV Decel Time: 190 msec    TR Vmax:        236.00 cm/s MV E velocity: 67.30 cm/s MV A velocity: 93.00 cm/s  SHUNTS MV E/A ratio:  0.72        Systemic VTI:  0.24 m                            Systemic Diam: 2.30 cm Dorris Carnes MD Electronically signed by Dorris Carnes MD Signature Date/Time: 04/02/2020/5:00:41 PM    Final

## 2020-04-04 ENCOUNTER — Encounter (HOSPITAL_COMMUNITY): Payer: Self-pay | Admitting: Internal Medicine

## 2020-04-04 ENCOUNTER — Inpatient Hospital Stay (HOSPITAL_COMMUNITY): Payer: Medicare Other | Admitting: Anesthesiology

## 2020-04-04 ENCOUNTER — Encounter (HOSPITAL_COMMUNITY)
Admission: EM | Disposition: A | Discharge status: Home Health | Payer: Self-pay | Source: Home / Self Care | Attending: Internal Medicine

## 2020-04-04 HISTORY — PX: INCISION AND DRAINAGE ABSCESS: SHX5864

## 2020-04-04 LAB — CBC WITH DIFFERENTIAL/PLATELET
Abs Immature Granulocytes: 0.03 10*3/uL (ref 0.00–0.07)
Basophils Absolute: 0.1 10*3/uL (ref 0.0–0.1)
Basophils Relative: 1 %
Eosinophils Absolute: 0.6 10*3/uL — ABNORMAL HIGH (ref 0.0–0.5)
Eosinophils Relative: 7 %
HCT: 37.6 % — ABNORMAL LOW (ref 39.0–52.0)
Hemoglobin: 12.5 g/dL — ABNORMAL LOW (ref 13.0–17.0)
Immature Granulocytes: 0 %
Lymphocytes Relative: 30 %
Lymphs Abs: 2.7 10*3/uL (ref 0.7–4.0)
MCH: 30.9 pg (ref 26.0–34.0)
MCHC: 33.2 g/dL (ref 30.0–36.0)
MCV: 92.8 fL (ref 80.0–100.0)
Monocytes Absolute: 1 10*3/uL (ref 0.1–1.0)
Monocytes Relative: 11 %
Neutro Abs: 4.6 10*3/uL (ref 1.7–7.7)
Neutrophils Relative %: 51 %
Platelets: 305 10*3/uL (ref 150–400)
RBC: 4.05 MIL/uL — ABNORMAL LOW (ref 4.22–5.81)
RDW: 13.6 % (ref 11.5–15.5)
WBC: 8.9 10*3/uL (ref 4.0–10.5)
nRBC: 0 % (ref 0.0–0.2)

## 2020-04-04 LAB — COMPREHENSIVE METABOLIC PANEL
ALT: 13 U/L (ref 0–44)
AST: 13 U/L — ABNORMAL LOW (ref 15–41)
Albumin: 3.2 g/dL — ABNORMAL LOW (ref 3.5–5.0)
Alkaline Phosphatase: 58 U/L (ref 38–126)
Anion gap: 11 (ref 5–15)
BUN: 18 mg/dL (ref 8–23)
CO2: 24 mmol/L (ref 22–32)
Calcium: 9.2 mg/dL (ref 8.9–10.3)
Chloride: 101 mmol/L (ref 98–111)
Creatinine, Ser: 1.12 mg/dL (ref 0.61–1.24)
GFR, Estimated: >60 mL/min (ref >60)
Glucose, Bld: 212 mg/dL — ABNORMAL HIGH (ref 70–99)
Potassium: 3.8 mmol/L (ref 3.5–5.1)
Sodium: 136 mmol/L (ref 135–145)
Total Bilirubin: 0.8 mg/dL (ref 0.3–1.2)
Total Protein: 5.7 g/dL — ABNORMAL LOW (ref 6.5–8.1)

## 2020-04-04 LAB — MAGNESIUM: Magnesium: 1.7 mg/dL (ref 1.7–2.4)

## 2020-04-04 LAB — GLUCOSE, CAPILLARY
Glucose-Capillary: 194 mg/dL — ABNORMAL HIGH (ref 70–99)
Glucose-Capillary: 183 mg/dL — ABNORMAL HIGH (ref 70–99)
Glucose-Capillary: 182 mg/dL — ABNORMAL HIGH (ref 70–99)
Glucose-Capillary: 253 mg/dL — ABNORMAL HIGH (ref 70–99)
Glucose-Capillary: 372 mg/dL — ABNORMAL HIGH (ref 70–99)
Glucose-Capillary: 364 mg/dL — ABNORMAL HIGH (ref 70–99)

## 2020-04-04 LAB — LIPASE, BLOOD: Lipase: 51 U/L (ref 11–51)

## 2020-04-04 LAB — C-REACTIVE PROTEIN: CRP: 1.8 mg/dL — ABNORMAL HIGH (ref <1.0)

## 2020-04-04 LAB — D-DIMER, QUANTITATIVE: D-Dimer, Quant: 0.49 ug/mL-FEU (ref 0.00–0.50)

## 2020-04-04 LAB — BRAIN NATRIURETIC PEPTIDE: B Natriuretic Peptide: 186.3 pg/mL — ABNORMAL HIGH (ref 0.0–100.0)

## 2020-04-04 SURGERY — INCISION AND DRAINAGE, ABSCESS
Anesthesia: General | Site: Arm Upper | Laterality: Left

## 2020-04-04 MED ORDER — GABAPENTIN 100 MG PO CAPS
100.0000 mg | ORAL_CAPSULE | Freq: Once | ORAL | Status: AC | PRN
  Administered 2020-04-04: 100 mg via ORAL
  Filled 2020-04-04: qty 1

## 2020-04-04 MED ORDER — 0.9 % SODIUM CHLORIDE (POUR BTL) OPTIME
TOPICAL | Status: DC | PRN
  Administered 2020-04-04: 1000 mL

## 2020-04-04 MED ORDER — THROMBIN 5000 UNITS EX SOLR
CUTANEOUS | Status: AC
  Filled 2020-04-04: qty 5000

## 2020-04-04 MED ORDER — THROMBIN 5000 UNITS EX SOLR
OROMUCOSAL | Status: DC | PRN
  Administered 2020-04-04: 5 mL via TOPICAL

## 2020-04-04 MED ORDER — PHENYLEPHRINE 40 MCG/ML (10ML) SYRINGE FOR IV PUSH (FOR BLOOD PRESSURE SUPPORT)
PREFILLED_SYRINGE | INTRAVENOUS | Status: DC | PRN
  Administered 2020-04-04: 80 ug via INTRAVENOUS
  Administered 2020-04-04 (×3): 120 ug via INTRAVENOUS
  Administered 2020-04-04: 80 ug via INTRAVENOUS
  Administered 2020-04-04: 120 ug via INTRAVENOUS
  Administered 2020-04-04: 160 ug via INTRAVENOUS

## 2020-04-04 MED ORDER — DEXAMETHASONE SODIUM PHOSPHATE 10 MG/ML IJ SOLN
INTRAMUSCULAR | Status: DC | PRN
  Administered 2020-04-04: 5 mg via INTRAVENOUS

## 2020-04-04 MED ORDER — LACTATED RINGERS IV SOLN: INTRAVENOUS | Status: DC

## 2020-04-04 MED ORDER — ONDANSETRON HCL 4 MG/2ML IJ SOLN
INTRAMUSCULAR | Status: DC | PRN
  Administered 2020-04-04: 4 mg via INTRAVENOUS

## 2020-04-04 MED ORDER — CHLORHEXIDINE GLUCONATE 0.12 % MT SOLN: 15.0000 mL | Freq: Once | OROMUCOSAL | Status: AC

## 2020-04-04 MED ORDER — CHLORHEXIDINE GLUCONATE 0.12 % MT SOLN
OROMUCOSAL | Status: AC
  Administered 2020-04-04: 15 mL via OROMUCOSAL
  Filled 2020-04-04: qty 15

## 2020-04-04 MED ORDER — CEFAZOLIN SODIUM-DEXTROSE 2-4 GM/100ML-% IV SOLN
2.0000 g | Freq: Three times a day (TID) | INTRAVENOUS | Status: DC
  Administered 2020-04-04 – 2020-04-06 (×6): 2 g via INTRAVENOUS
  Filled 2020-04-04 (×8): qty 100

## 2020-04-04 MED ORDER — LIDOCAINE 2% (20 MG/ML) 5 ML SYRINGE
INTRAMUSCULAR | Status: DC | PRN
  Administered 2020-04-04: 60 mg via INTRAVENOUS

## 2020-04-04 MED ORDER — OXYCODONE HCL 5 MG PO TABS
2.5000 mg | ORAL_TABLET | Freq: Four times a day (QID) | ORAL | Status: DC | PRN
  Administered 2020-04-04 – 2020-04-05 (×2): 5 mg via ORAL
  Filled 2020-04-04 (×2): qty 1

## 2020-04-04 MED ORDER — TRAMADOL HCL 50 MG PO TABS
50.0000 mg | ORAL_TABLET | Freq: Four times a day (QID) | ORAL | Status: DC | PRN
Start: 2020-04-04 — End: 2020-04-06
  Administered 2020-04-05: 50 mg via ORAL
  Filled 2020-04-04: qty 1

## 2020-04-04 MED ORDER — FENTANYL CITRATE (PF) 250 MCG/5ML IJ SOLN
INTRAMUSCULAR | Status: DC | PRN
  Administered 2020-04-04: 25 ug via INTRAVENOUS

## 2020-04-04 MED ORDER — EPHEDRINE SULFATE 50 MG/ML IJ SOLN
INTRAMUSCULAR | Status: DC | PRN
  Administered 2020-04-04 (×2): 10 mg via INTRAVENOUS

## 2020-04-04 MED ORDER — PROPOFOL 10 MG/ML IV BOLUS
INTRAVENOUS | Status: DC | PRN
  Administered 2020-04-04: 130 mg via INTRAVENOUS

## 2020-04-04 MED ORDER — PROPOFOL 10 MG/ML IV BOLUS
INTRAVENOUS | Status: AC
  Filled 2020-04-04: qty 40

## 2020-04-04 MED ORDER — FENTANYL CITRATE (PF) 250 MCG/5ML IJ SOLN
INTRAMUSCULAR | Status: AC
  Filled 2020-04-04: qty 5

## 2020-04-04 SURGICAL SUPPLY — 40 items
BNDG COHESIVE 4X5 TAN STRL (GAUZE/BANDAGES/DRESSINGS) ×1 IMPLANT
BNDG GAUZE ELAST 4 BULKY (GAUZE/BANDAGES/DRESSINGS) IMPLANT
BRIEF STRETCH FOR OB PAD LRG (UNDERPADS AND DIAPERS) IMPLANT
CANISTER SUCT 3000ML PPV (MISCELLANEOUS) ×2 IMPLANT
COVER SURGICAL LIGHT HANDLE (MISCELLANEOUS) ×1 IMPLANT
COVER WAND RF STERILE (DRAPES) ×1 IMPLANT
DRAPE LAPAROSCOPIC ABDOMINAL (DRAPES) IMPLANT
DRAPE LAPAROTOMY 100X72 PEDS (DRAPES) ×1 IMPLANT
DRSG PAD ABDOMINAL 8X10 ST (GAUZE/BANDAGES/DRESSINGS) IMPLANT
ELECT CAUTERY BLADE 6.4 (BLADE) ×2 IMPLANT
ELECT REM PT RETURN 9FT ADLT (ELECTROSURGICAL) ×2
ELECTRODE REM PT RTRN 9FT ADLT (ELECTROSURGICAL) ×1 IMPLANT
GAUZE PACKING IODOFORM 1/2 (PACKING) IMPLANT
GAUZE PACKING IODOFORM 1/4X15 (PACKING) IMPLANT
GAUZE SPONGE 4X4 12PLY STRL (GAUZE/BANDAGES/DRESSINGS) IMPLANT
GAUZE SPONGE 4X4 12PLY STRL LF (GAUZE/BANDAGES/DRESSINGS) ×1 IMPLANT
GAUZE XEROFORM 5X9 LF (GAUZE/BANDAGES/DRESSINGS) ×1 IMPLANT
GLOVE BIO SURGEON STRL SZ 6 (GLOVE) ×2 IMPLANT
GLOVE SURG ENC MOIS LTX SZ6.5 (GLOVE) ×2 IMPLANT
GLOVE SURG UNDER LTX SZ6.5 (GLOVE) ×2 IMPLANT
GLOVE SURG UNDER POLY LF SZ6.5 (GLOVE) ×2 IMPLANT
GLOVE SURG UNDER POLY LF SZ7 (GLOVE) ×2 IMPLANT
GOWN STRL REUS W/ TWL LRG LVL3 (GOWN DISPOSABLE) ×1 IMPLANT
GOWN STRL REUS W/TWL 2XL LVL3 (GOWN DISPOSABLE) ×2 IMPLANT
GOWN STRL REUS W/TWL LRG LVL3 (GOWN DISPOSABLE) ×4
HEMOSTAT POWDER KIT SURGIFOAM (HEMOSTASIS) ×1 IMPLANT
HEMOSTAT POWDER SURGIFOAM 1G (HEMOSTASIS) ×1 IMPLANT
KIT BASIN OR (CUSTOM PROCEDURE TRAY) ×2 IMPLANT
KIT TURNOVER KIT B (KITS) ×2 IMPLANT
NS IRRIG 1000ML POUR BTL (IV SOLUTION) ×2 IMPLANT
PACK GENERAL/GYN (CUSTOM PROCEDURE TRAY) ×2 IMPLANT
PAD ABD 8X10 STRL (GAUZE/BANDAGES/DRESSINGS) ×1 IMPLANT
PAD ARMBOARD 7.5X6 YLW CONV (MISCELLANEOUS) ×4 IMPLANT
PENCIL SMOKE EVACUATOR (MISCELLANEOUS) ×2 IMPLANT
STOCKINETTE IMPERVIOUS LG (DRAPES) ×1 IMPLANT
SUT ETHILON 2 0 FS 18 (SUTURE) ×1 IMPLANT
SUT ETHILON 2 0 PSLX (SUTURE) ×1 IMPLANT
TOWEL GREEN STERILE (TOWEL DISPOSABLE) ×2 IMPLANT
TOWEL GREEN STERILE FF (TOWEL DISPOSABLE) ×2 IMPLANT
UNDERPAD 30X36 HEAVY ABSORB (UNDERPADS AND DIAPERS) IMPLANT

## 2020-04-04 NOTE — Op Note (Signed)
PRE-OPERATIVE DIAGNOSIS: left upper arm hematoma s/p wide local excision of melanoma and advancement flap closure  POST-OPERATIVE DIAGNOSIS:  Same  PROCEDURE:  Procedure(s): Evacuation and washout of left arm hematoma  SURGEON:  Surgeon(s): Stark Klein, MD  ANESTHESIA:   general  DRAINS: none   LOCAL MEDICATIONS USED:  NONE  SPECIMEN:  No Specimen  DISPOSITION OF SPECIMEN:  N/A  COUNTS:  YES  DICTATION: .Dragon Dictation  PLAN OF CARE: back to inpatient room  PATIENT DISPOSITION:  PACU - hemodynamically stable.  FINDINGS:  Hematoma in left arm wound, no active bleeding.  EBL:  old blood  PROCEDURE:   . Patient was identified in the holding area and taken to the operating room where he was placed supine on the operating room table.  General anesthesia was induced.  Pneumatic compression devices were placed on the patient prior to induction.  Active warming was placed.  The patient's left arm was then prepped and draped in sterile fashion.  A timeout was performed according to the surgical safety checklist.  When all was correct, we continued.  The previous incision was opened and the nylon sutures were removed.  The large clot was evacuated from the wound.  There was no active bleeding seen.  The wound was then copiously irrigated.  The entire wound with the raw surfaces was meticulously examined.  Everything was cauterized.  It was then rinsed again and reinspected for hemostasis.  Surgifoam was placed into the wound.  The wound was then closed with running nylon 2-0 suture.  The arm was then cleaned, dried, and dressed with Xeroform, gauze, and Coban.  The patient was then allowed to emerge from anesthesia and taken to the PACU in stable condition.  Needle, sponge, and instrument counts were correct.

## 2020-04-04 NOTE — Progress Notes (Signed)
Progress Note    [name]  EHM:[id number] DOB: 16-Nov-1934  DOA: 04/01/2020 PCP: [name] Ghent, MD    Brief Narrative:   Medical records reviewed and are as summarized below:  [name] is an 86 y.o. male with medical history significant ofh/o CAD with last myoview 2014 read as low risk, DM, HTN, recently had left shoulder melanoma surgery by Dr. Barry Mathews few days prior to hospital visit, who reports having chest pressure at 0800 hrs with associated nausea and a feeling of being light-headed, also had ongoing left shoulder pain postop.  He was admitted for chest pain, AKI and ongoing left shoulder discomfort.  He was seen by cardiology and general surgery, surgery planning to take him for evacuation of hematoma from the left shoulder site on 04/04/2020.  Assessment/Plan:   Active Problems:   Essential hypertension   CAD (coronary artery disease)   BPH with obstruction/lower urinary tract symptoms   HLD (hyperlipidemia)   Type 2 diabetes mellitus with vascular disease (HCC)   Chest pain   Epigastric abdominal pain    Chest pain in a patient with history of CAD. He had non-ACS pattern troponin rise, currently on aspirin, Coreg and statin for secondary prevention.  Now pain and symptom-free -cardiology on board    Abdominal pain.   -Completely resolved will place on PPI and monitor.  Lipase is stable.  Exam unremarkable   Dyslipidemia.   -Continue statin at home dose.  Hypertension.   -On Coreg and Norvasc    BPH.   -Continue Flomax.  Recent left shoulder melanoma surgery by Dr. Barry Mathews, some pain and discomfort at the site of surgery needs repeat evacuation on 2/23  AKI.  -resolved  Hypomagnesemia.   -repleted  DM Type II.   -On Lantus with sliding scale.  obesity Body mass index is 31.69 kg/m.   Family Communication/Anticipated D/C date and plan/Code Status   DVT prophylaxis: Lovenox ordered. Code Status: dnr   Disposition Plan:  Status is: Inpatient  Remains inpatient appropriate because:Inpatient level of care appropriate due to severity of illness   Dispo: The patient is from: Home              Anticipated d/c is to: Home              Anticipated d/c date is: 2 days              Patient currently is not medically stable to d/c.   Difficult to place patient No         Medical Consultants:    General surgery  cards  Subjective:     Objective:    Vitals:   04/04/20 1045 04/04/20 1100 04/04/20 1115 04/04/20 1301  BP: (!) 143/78 (!) 145/77 (!) 150/79 (!) 160/73  Pulse: 77 73 70   Resp: 20 15 16    Temp: (!) 97.1 F (36.2 C)  (!) 97 F (36.1 C)   TempSrc:      SpO2: 98% 92% 96%   Weight:      Height:        Intake/Output Summary (Last 24 hours) at 04/04/2020 1304 Last data filed at 04/04/2020 1112 Gross per 24 hour  Intake 850 ml  Output 411 ml  Net 439 ml   Filed Weights   04/01/20 1849  Weight: 106 kg    Exam:  General: Appearance:    Obese male in no acute distress     Lungs:  respirations unlabored  Heart:    Normal heart rate. Normal rhythm. No murmurs, rubs, or gallops.   MS:   Left should wrapped with gauze  Neurologic:   Awake, alert, oriented x 3. No apparent focal neurological           defect.     Data Reviewed:   I have personally reviewed following labs and imaging studies:  Labs: Labs show the following:   Basic Metabolic Panel: Recent Labs  Lab 03/29/20 0604 04/01/20 1058 04/02/20 0748 04/03/20 0127 04/04/20 0136  NA 140 138 137 134* 136  K 3.4* 3.6 3.5 3.5 3.8  CL 104 100 100 99 101  CO2 23 27 26 26 24   GLUCOSE 228* 208* 221* 296* 212*  BUN 25* 17 22 21 18   CREATININE 1.18 1.09 1.46* 1.24 1.12  CALCIUM 9.7 9.8 9.6 9.0 9.2  MG  --   --  1.7 1.6* 1.7   GFR Estimated Creatinine Clearance: 62.9 mL/min (by C-G formula based on SCr of 1.12 mg/dL). Liver Function Tests: Recent Labs  Lab 04/01/20 1155 04/02/20 0748 04/03/20 0127  04/04/20 0136  AST 15 15 14* 13*  ALT 15 15 15 13   ALKPHOS 59 68 61 58  BILITOT 0.5 0.9 0.8 0.8  PROT 6.2* 6.1* 5.7* 5.7*  ALBUMIN 3.5 3.4* 3.2* 3.2*   Recent Labs  Lab 04/01/20 1155 04/01/20 1458 04/02/20 0748 04/03/20 0127 04/04/20 0136  LIPASE 40 40 42 46 51  AMYLASE  --  42  --   --   --    No results for input(s): AMMONIA in the last 168 hours. Coagulation profile No results for input(s): INR, PROTIME in the last 168 hours.  CBC: Recent Labs  Lab 03/29/20 0604 04/01/20 1058 04/02/20 0748 04/03/20 0127 04/04/20 0136  WBC 8.1 9.1 9.6 7.9 8.9  NEUTROABS  --   --   --  4.0 4.6  HGB 15.2 14.5 13.6 12.1* 12.5*  HCT 44.2 44.8 40.7 34.8* 37.6*  MCV 93.1 94.3 92.5 91.6 92.8  PLT 287 317 321 296 305   Cardiac Enzymes: No results for input(s): CKTOTAL, CKMB, CKMBINDEX, TROPONINI in the last 168 hours. BNP (last 3 results) No results for input(s): PROBNP in the last 8760 hours. CBG: Recent Labs  Lab 04/03/20 2117 04/04/20 0638 04/04/20 0850 04/04/20 1044 04/04/20 1242  GLUCAP 196* 194* 183* 182* 253*   D-Dimer: Recent Labs    04/03/20 0127 04/04/20 0136  DDIMER 0.50 0.49   Hgb A1c: Recent Labs    04/01/20 1458  HGBA1C 8.6*   Lipid Profile: Recent Labs    04/02/20 0029  CHOL 147  HDL 30*  LDLCALC 80  TRIG 185*  CHOLHDL 4.9   Thyroid function studies: No results for input(s): TSH, T4TOTAL, T3FREE, THYROIDAB in the last 72 hours.  Invalid input(s): FREET3 Anemia work up: No results for input(s): VITAMINB12, FOLATE, FERRITIN, TIBC, IRON, RETICCTPCT in the last 72 hours. Sepsis Labs: Recent Labs  Lab 04/01/20 1058 04/02/20 0748 04/03/20 0127 04/04/20 0136  WBC 9.1 9.6 7.9 8.9    Microbiology Recent Results (from the past 240 hour(s))  SARS CORONAVIRUS 2 (TAT 6-24 HRS) Nasopharyngeal Nasopharyngeal Swab     Status: None   Collection Time: 03/26/20  9:01 AM   Specimen: Nasopharyngeal Swab  Result Value Ref Range Status   SARS  Coronavirus 2 NEGATIVE NEGATIVE Final    Comment: (NOTE) SARS-CoV-2 target nucleic acids are NOT DETECTED.  The SARS-CoV-2 RNA is generally detectable in  upper and lower respiratory specimens during the acute phase of infection. Negative results do not preclude SARS-CoV-2 infection, do not rule out co-infections with other pathogens, and should not be used as the sole basis for treatment or other patient management decisions. Negative results must be combined with clinical observations, patient history, and epidemiological information. The expected result is Negative.  Fact Sheet for Patients: SugarRoll.be  Fact Sheet for Healthcare Providers: https://www.woods-mathews.com/  This test is not yet approved or cleared by the [place] FDA and  has been authorized for detection and/or diagnosis of SARS-CoV-2 by FDA under an Emergency Use Authorization (EUA). This EUA will remain  in effect (meaning this test can be used) for the duration of the COVID-19 declaration under Se ction 564(b)(1) of the Act, 21 U.S.C. section 360bbb-3(b)(1), unless the authorization is terminated or revoked sooner.  Performed at [facility], [place] [address], [place], [place] [address]   Resp Panel by RT-PCR (Flu A&B, Covid) Nasopharyngeal Swab     Status: None   Collection Time: 04/01/20 11:55 AM   Specimen: Nasopharyngeal Swab; Nasopharyngeal(NP) swabs in vial transport medium  Result Value Ref Range Status   SARS Coronavirus 2 by RT PCR NEGATIVE NEGATIVE Final    Comment: (NOTE) SARS-CoV-2 target nucleic acids are NOT DETECTED.  The SARS-CoV-2 RNA is generally detectable in upper respiratory specimens during the acute phase of infection. The lowest concentration of SARS-CoV-2 viral copies this assay can detect is 138 copies/mL. A negative result does not preclude SARS-Cov-2 infection and should not be used as the sole basis for treatment or other  patient management decisions. A negative result may occur with  improper specimen collection/handling, submission of specimen other than nasopharyngeal swab, presence of viral mutation(s) within the areas targeted by this assay, and inadequate number of viral copies(<138 copies/mL). A negative result must be combined with clinical observations, patient history, and epidemiological information. The expected result is Negative.  Fact Sheet for Patients:  EntrepreneurPulse.com.au  Fact Sheet for Healthcare Providers:  IncredibleEmployment.be  This test is no t yet approved or cleared by the [place] FDA and  has been authorized for detection and/or diagnosis of SARS-CoV-2 by FDA under an Emergency Use Authorization (EUA). This EUA will remain  in effect (meaning this test can be used) for the duration of the COVID-19 declaration under Section 564(b)(1) of the Act, 21 U.S.C.section 360bbb-3(b)(1), unless the authorization is terminated  or revoked sooner.       Influenza A by PCR NEGATIVE NEGATIVE Final   Influenza B by PCR NEGATIVE NEGATIVE Final    Comment: (NOTE) The Xpert Xpress SARS-CoV-2/FLU/RSV plus assay is intended as an aid in the diagnosis of influenza from Nasopharyngeal swab specimens and should not be used as a sole basis for treatment. Nasal washings and aspirates are unacceptable for Xpert Xpress SARS-CoV-2/FLU/RSV testing.  Fact Sheet for Patients: EntrepreneurPulse.com.au  Fact Sheet for Healthcare Providers: IncredibleEmployment.be  This test is not yet approved or cleared by the [place] FDA and has been authorized for detection and/or diagnosis of SARS-CoV-2 by FDA under an Emergency Use Authorization (EUA). This EUA will remain in effect (meaning this test can be used) for the duration of the COVID-19 declaration under Section 564(b)(1) of the Act, 21 U.S.C. section  360bbb-3(b)(1), unless the authorization is terminated or revoked.  Performed at [facility], [place] [address], [place], [place] [address]     Procedures and diagnostic studies:  ECHOCARDIOGRAM COMPLETE  Result Date: 04/02/2020  ECHOCARDIOGRAM REPORT   Patient Name:   [name] Date of Exam: 04/02/2020 Medical Rec #:  [id number]       Height:       72.0 in Accession #:    [id number]      Weight:       233.7 lb Date of Birth:  August 23, 1934      BSA:          2.276 m Patient Age:    48 years        BP:           132/67 mmHg Patient Gender: M               HR:           84 bpm. Exam Location:  Inpatient Procedure: 2D Echo, Cardiac Doppler, Color Doppler and Intracardiac            Opacification Agent Indications:    Elevated Troponin  History:        Patient has prior history of Echocardiogram examinations, most                 recent 01/27/2014. CHF, Arrythmias:PVC, Signs/Symptoms:Chest                 Pain; Risk Factors:Hypertension and Dyslipidemia.  Sonographer:    Darlina Sicilian RDCS Referring Phys: [phone] TIFFANY Regino Ramirez IMPRESSIONS  1. COmpared to previous report LVEF is mildly worse.  2. LVEF is depressed with severe hypokinesis of the inferiorl hyopokinesis of the base/mid inferoseptal wall. Left ventricular ejection fraction, by estimation, is 40 to 45%. The left ventricle has mildly decreased function. The left ventricular internal cavity size was moderately dilated. Left ventricular diastolic parameters are consistent with Grade I diastolic dysfunction (impaired relaxation).  3. Right ventricular systolic function is normal. The right ventricular size is normal. There is normal pulmonary artery systolic pressure.  4. The mitral valve is grossly normal. Trivial mitral valve regurgitation.  5. The aortic valve is normal in structure. Aortic valve regurgitation is not visualized. FINDINGS  Left Ventricle: LVEF is depressed with severe hypokinesis of the inferiorl hyopokinesis of the  base/mid inferoseptal wall. Left ventricular ejection fraction, by estimation, is 40 to 45%. The left ventricle has mildly decreased function. Definity contrast agent was given IV to delineate the left ventricular endocardial borders. The left ventricular internal cavity size was moderately dilated. There is no left ventricular hypertrophy. Left ventricular diastolic parameters are consistent with Grade  I diastolic dysfunction (impaired relaxation). Right Ventricle: The right ventricular size is normal. Right vetricular wall thickness was not assessed. Right ventricular systolic function is normal. There is normal pulmonary artery systolic pressure. The tricuspid regurgitant velocity is 2.36 m/s, and with an assumed right atrial pressure of 3 mmHg, the estimated right ventricular systolic pressure is 34.1 mmHg. Left Atrium: Left atrial size was normal in size. Right Atrium: Right atrial size was normal in size. Pericardium: There is no evidence of pericardial effusion. Mitral Valve: The mitral valve is grossly normal. Trivial mitral valve regurgitation. Tricuspid Valve: The tricuspid valve is normal in structure. Tricuspid valve regurgitation is trivial. Aortic Valve: The aortic valve is normal in structure. Aortic valve regurgitation is not visualized. Pulmonic Valve: The pulmonic valve was not well visualized. Pulmonic valve regurgitation is not visualized. Aorta: The aortic root is normal in size and structure. IAS/Shunts: The interatrial septum was not assessed.  LEFT VENTRICLE PLAX 2D LVIDd:         5.75  cm      Diastology LVIDs:         4.50 cm      LV e' medial:    6.09 cm/s LV PW:         0.85 cm      LV E/e' medial:  11.1 LV IVS:        1.10 cm      LV e' lateral:   8.49 cm/s LVOT diam:     2.30 cm      LV E/e' lateral: 7.9 LV SV:         99 LV SV Index:   43 LVOT Area:     4.15 cm  LV Volumes (MOD) LV vol d, MOD A2C: 124.9 ml LV vol d, MOD A4C: 190.5 ml LV vol s, MOD A2C: 69.4 ml LV vol s, MOD A4C: 89.6  ml LV SV MOD A2C:     55.6 ml LV SV MOD A4C:     190.5 ml LV SV MOD BP:      73.9 ml RIGHT VENTRICLE RV S prime:     10.80 cm/s TAPSE (M-mode): 1.8 cm LEFT ATRIUM             Index       RIGHT ATRIUM           Index LA diam:        4.30 cm 1.89 cm/m  RA Area:     10.70 cm LA Vol (A2C):   49.6 ml 21.79 ml/m RA Volume:   17.20 ml  7.56 ml/m LA Vol (A4C):   63.2 ml 27.76 ml/m LA Biplane Vol: 55.3 ml 24.29 ml/m  AORTIC VALVE LVOT Vmax:   134.50 cm/s LVOT Vmean:  83.550 cm/s LVOT VTI:    0.238 m  AORTA Ao Root diam: 3.80 cm MITRAL VALVE               TRICUSPID VALVE MV Area (PHT): 3.99 cm    TR Peak grad:   22.3 mmHg MV Decel Time: 190 msec    TR Vmax:        236.00 cm/s MV E velocity: 67.30 cm/s MV A velocity: 93.00 cm/s  SHUNTS MV E/A ratio:  0.72        Systemic VTI:  0.24 m                            Systemic Diam: 2.30 cm Dorris Carnes MD Electronically signed by Dorris Carnes MD Signature Date/Time: 04/02/2020/5:00:41 PM    Final     Medications:   . amLODipine  10 mg Oral q morning  . aspirin EC  81 mg Oral QPM  . atorvastatin  40 mg Oral Daily  . carvedilol  12.5 mg Oral BID WC  . heparin  5,000 Units Subcutaneous Q8H  . hydrALAZINE  50 mg Oral TID  . insulin aspart  0-15 Units Subcutaneous TID WC  . insulin glargine  6 Units Subcutaneous QHS  . pantoprazole  40 mg Oral Daily  . potassium chloride SA  20 mEq Oral QPM  . tamsulosin  0.4 mg Oral QPC supper   Continuous Infusions: . sodium chloride 10 mL/hr at 04/01/20 1500  .  ceFAZolin (ANCEF) IV    .  ceFAZolin (ANCEF) IV       LOS: 1 day   Geradine Girt  [facility]   How to contact the [facility] Attending or Consulting  provider [place] or covering provider during after hours [place], for this patient?  1. Check the care team in [facility] and look for a) attending/consulting [facility] provider listed and b) the [facility] team listed 2. Log into [email or web address] and use [place]'s universal password to access. If you do not have the password, please  contact the hospital operator. 3. Locate the [facility] provider you are looking for under [facility] and page to a number that you can be directly reached. 4. If you still have difficulty reaching the provider, please page the [facility] (Director on Call) for the Hospitalists listed on amion for assistance.  04/04/2020, 1:04 PM

## 2020-04-04 NOTE — Discharge Instructions (Addendum)
Dressing care: Ok to shower starting 2/25. Change dressing daily with xeroform, gauze, and tape or adhesive dressing.  If xeroform or vaseline gauze is not available, apply antibiotic ointment/polysporin/bacitracin to raw areas of wound and non stick gauze under the plain gauze and tape.    No lifting or strenuous activity with left arm for 2 weeks.

## 2020-04-04 NOTE — Telephone Encounter (Signed)
Please let him know that I appreciate the update.  I will be thinking about them.  The inpatient team is sending me notes and I will be reviewing those in the meantime.  Thanks.

## 2020-04-04 NOTE — Anesthesia Preprocedure Evaluation (Addendum)
Anesthesia Evaluation  Patient identified by MRN, date of birth, ID band Patient awake    Reviewed: Allergy & Precautions, NPO status , Patient's Chart, lab work & pertinent test results, reviewed documented beta blocker date and time   History of Anesthesia Complications (+) history of anesthetic complications (urinary retention)  Airway Mallampati: I  TM Distance: >3 FB Neck ROM: Full    Dental no notable dental hx. (+) Teeth Intact, Dental Advisory Given   Pulmonary neg pulmonary ROS,    Pulmonary exam normal breath sounds clear to auscultation       Cardiovascular hypertension, Pt. on home beta blockers and Pt. on medications +CHF (LVEF 95-63%, grade 1 diastolic dysfunction)  Normal cardiovascular exam Rhythm:Regular Rate:Normal  1. COmpared to previous report LVEF is mildly worse.  2. LVEF is depressed with severe hypokinesis of the inferiorl  hyopokinesis of the base/mid inferoseptal wall. Left ventricular ejection  fraction, by estimation, is 40 to 45%. The left ventricle has mildly  decreased function. The left ventricular  internal cavity size was moderately dilated. Left ventricular diastolic  parameters are consistent with Grade I diastolic dysfunction (impaired  relaxation).  3. Right ventricular systolic function is normal. The right ventricular  size is normal. There is normal pulmonary artery systolic pressure.  4. The mitral valve is grossly normal. Trivial mitral valve  regurgitation.  5. The aortic valve is normal in structure. Aortic valve regurgitation is  not visualized.    Neuro/Psych PSYCHIATRIC DISORDERS Anxiety negative neurological ROS     GI/Hepatic negative GI ROS, Neg liver ROS,   Endo/Other  diabetes, Poorly Controlled, Type 2, Insulin Dependent, Oral Hypoglycemic AgentsObesity BMI 32 FS 183 a1c 8.6  Renal/GU Renal InsufficiencyRenal diseaseCr 1.12  negative genitourinary    Musculoskeletal  (+) Arthritis , Osteoarthritis,    Abdominal   Peds  Hematology negative hematology ROS (+)   Anesthesia Other Findings   Reproductive/Obstetrics negative OB ROS                           Anesthesia Physical Anesthesia Plan  ASA: III  Anesthesia Plan: General   Post-op Pain Management:    Induction: Intravenous  PONV Risk Score and Plan: 2 and Ondansetron, Dexamethasone and Treatment may vary due to age or medical condition  Airway Management Planned: LMA  Additional Equipment: None  Intra-op Plan:   Post-operative Plan: Extubation in OR  Informed Consent: I have reviewed the patients History and Physical, chart, labs and discussed the procedure including the risks, benefits and alternatives for the proposed anesthesia with the patient or authorized representative who has indicated his/her understanding and acceptance.   Patient has DNR.  Discussed DNR with patient and Suspend DNR.   Dental advisory given  Plan Discussed with: CRNA  Anesthesia Plan Comments: (D/w pt suspending DNR for 24h perioperatively, but continue to withhold chest compressions.)        Anesthesia Quick Evaluation

## 2020-04-04 NOTE — Anesthesia Postprocedure Evaluation (Signed)
Anesthesia Post Note  [name]  Procedure(s) Performed: Sharren Bridge OF LEFT ARM HEMATOMA (Left Arm Upper)     Patient location during evaluation: PACU Anesthesia Type: General Level of consciousness: awake and alert, oriented and patient cooperative Pain management: pain level controlled Vital Signs Assessment: post-procedure vital signs reviewed and stable Respiratory status: spontaneous breathing, nonlabored ventilation and respiratory function stable Cardiovascular status: blood pressure returned to baseline and stable Postop Assessment: no apparent nausea or vomiting Anesthetic complications: no   No complications documented.  Last Vitals:  Vitals:   04/04/20 0832 04/04/20 1045  BP: (!) 168/92 (!) 143/78  Pulse: 76 77  Resp:  20  Temp:  (!) 36.2 C  SpO2:  98%    Last Pain:  Vitals:   04/04/20 1045  TempSrc:   PainSc: 0-No pain                 Pervis Hocking

## 2020-04-04 NOTE — Interval H&P Note (Signed)
History and Physical Interval Note:  04/04/2020 9:14 AM  [name]  has presented today for surgery, with the diagnosis of LEFT ARM HEMATOMA.  The various methods of treatment have been discussed with the patient and family. After consideration of risks, benefits and other options for treatment, the patient has consented to  Procedure(s) with comments: IWASHOUT OF LEFT ARM HEMATOMA (Left) - left as a surgical intervention.  The patient's history has been reviewed, patient examined, no change in status, stable for surgery.  I have reviewed the patient's chart and labs.  Questions were answered to the patient's satisfaction.     Stark Klein

## 2020-04-04 NOTE — Progress Notes (Signed)
PT Cancellation Note  Patient Details Name: [name] MRN: [id number] DOB: 04-29-1934   Cancelled Treatment:    Reason Eval/Treat Not Completed: Patient at procedure or test/unavailable (Pt in surgery for evacuation of hematoma.  Will return later date.)   Denice Paradise 04/04/2020, 8:40 AM Roiza Wiedel W,PT Acute Rehabilitation Services Pager:  [phone]  Office:  [phone]

## 2020-04-04 NOTE — Transfer of Care (Signed)
Immediate Anesthesia Transfer of Care Note  [name]  Procedure(s) Performed: Sharren Bridge OF LEFT ARM HEMATOMA (Left Arm Upper)  Patient Location: PACU  Anesthesia Type:General  Level of Consciousness: awake, alert  and oriented  Airway & Oxygen Therapy: Patient Spontanous Breathing and Patient connected to nasal cannula oxygen  Post-op Assessment: Report given to RN and Post -op Vital signs reviewed and stable  Post vital signs: Reviewed and stable  Last Vitals:  Vitals Value Taken Time  BP 143/78 04/04/20 1045  Temp 36.2 C 04/04/20 1045  Pulse 74 04/04/20 1045  Resp 18 04/04/20 1045  SpO2 98 % 04/04/20 1045  Vitals shown include unvalidated device data.  Last Pain:  Vitals:   04/04/20 0430  TempSrc:   PainSc: Asleep      Patients Stated Pain Goal: 0 (16/10/96 0454)  Complications: No complications documented.

## 2020-04-04 NOTE — Plan of Care (Signed)
  Problem: Education: Goal: Knowledge of General Education information will improve Description: Including pain rating scale, medication(s)/side effects and non-pharmacologic comfort measures Outcome: Progressing   Problem: Clinical Measurements: Goal: Ability to maintain clinical measurements within normal limits will improve Outcome: Progressing Goal: Will remain free from infection Outcome: Progressing Goal: Diagnostic test results will improve Outcome: Progressing Goal: Respiratory complications will improve Outcome: Progressing Goal: Cardiovascular complication will be avoided Outcome: Progressing   Problem: Nutrition: Goal: Adequate nutrition will be maintained Outcome: Progressing   Problem: Coping: Goal: Level of anxiety will decrease Outcome: Progressing   Problem: Elimination: Goal: Will not experience complications related to bowel motility Outcome: Progressing Goal: Will not experience complications related to urinary retention Outcome: Progressing

## 2020-04-04 NOTE — Anesthesia Procedure Notes (Signed)
Procedure Name: LMA Insertion Date/Time: 04/04/2020 9:34 AM Performed by: Imagene Riches, CRNA Pre-anesthesia Checklist: Patient identified, Emergency Drugs available, Suction available and Patient being monitored Patient Re-evaluated:Patient Re-evaluated prior to induction Oxygen Delivery Method: Circle System Utilized Preoxygenation: Pre-oxygenation with 100% oxygen Induction Type: IV induction Ventilation: Mask ventilation without difficulty LMA: LMA inserted LMA Size: 5.0 Number of attempts: 1 Airway Equipment and Method: Bite block Placement Confirmation: positive ETCO2 Tube secured with: Tape Dental Injury: Teeth and Oropharynx as per pre-operative assessment

## 2020-04-05 ENCOUNTER — Encounter (HOSPITAL_COMMUNITY): Payer: Medicare Other

## 2020-04-05 ENCOUNTER — Encounter (HOSPITAL_COMMUNITY): Payer: Self-pay | Admitting: General Surgery

## 2020-04-05 ENCOUNTER — Inpatient Hospital Stay (HOSPITAL_COMMUNITY): Payer: Medicare Other

## 2020-04-05 DIAGNOSIS — T148XXA Other injury of unspecified body region, initial encounter: Secondary | ICD-10-CM

## 2020-04-05 DIAGNOSIS — M7989 Other specified soft tissue disorders: Secondary | ICD-10-CM

## 2020-04-05 LAB — CBC WITH DIFFERENTIAL/PLATELET
Abs Immature Granulocytes: 0.05 10*3/uL (ref 0.00–0.07)
Basophils Absolute: 0 10*3/uL (ref 0.0–0.1)
Basophils Relative: 0 %
Eosinophils Absolute: 0 10*3/uL (ref 0.0–0.5)
Eosinophils Relative: 0 %
HCT: 39.3 % (ref 39.0–52.0)
Hemoglobin: 12.8 g/dL — ABNORMAL LOW (ref 13.0–17.0)
Immature Granulocytes: 1 %
Lymphocytes Relative: 14 %
Lymphs Abs: 1.4 10*3/uL (ref 0.7–4.0)
MCH: 30.6 pg (ref 26.0–34.0)
MCHC: 32.6 g/dL (ref 30.0–36.0)
MCV: 94 fL (ref 80.0–100.0)
Monocytes Absolute: 0.7 10*3/uL (ref 0.1–1.0)
Monocytes Relative: 7 %
Neutro Abs: 7.6 10*3/uL (ref 1.7–7.7)
Neutrophils Relative %: 78 %
Platelets: 327 10*3/uL (ref 150–400)
RBC: 4.18 MIL/uL — ABNORMAL LOW (ref 4.22–5.81)
RDW: 13.7 % (ref 11.5–15.5)
WBC: 9.7 10*3/uL (ref 4.0–10.5)
nRBC: 0 % (ref 0.0–0.2)

## 2020-04-05 LAB — GLUCOSE, CAPILLARY
Glucose-Capillary: 455 mg/dL — ABNORMAL HIGH (ref 70–99)
Glucose-Capillary: 410 mg/dL — ABNORMAL HIGH (ref 70–99)
Glucose-Capillary: 360 mg/dL — ABNORMAL HIGH (ref 70–99)
Glucose-Capillary: 254 mg/dL — ABNORMAL HIGH (ref 70–99)
Glucose-Capillary: 218 mg/dL — ABNORMAL HIGH (ref 70–99)

## 2020-04-05 LAB — COMPREHENSIVE METABOLIC PANEL
ALT: 15 U/L (ref 0–44)
AST: 16 U/L (ref 15–41)
Albumin: 3.3 g/dL — ABNORMAL LOW (ref 3.5–5.0)
Alkaline Phosphatase: 68 U/L (ref 38–126)
Anion gap: 14 (ref 5–15)
BUN: 24 mg/dL — ABNORMAL HIGH (ref 8–23)
CO2: 20 mmol/L — ABNORMAL LOW (ref 22–32)
Calcium: 9.1 mg/dL (ref 8.9–10.3)
Chloride: 98 mmol/L (ref 98–111)
Creatinine, Ser: 1.52 mg/dL — ABNORMAL HIGH (ref 0.61–1.24)
GFR, Estimated: 45 mL/min — ABNORMAL LOW (ref >60)
Glucose, Bld: 453 mg/dL — ABNORMAL HIGH (ref 70–99)
Potassium: 4.5 mmol/L (ref 3.5–5.1)
Sodium: 132 mmol/L — ABNORMAL LOW (ref 135–145)
Total Bilirubin: 0.5 mg/dL (ref 0.3–1.2)
Total Protein: 6.2 g/dL — ABNORMAL LOW (ref 6.5–8.1)

## 2020-04-05 LAB — C-REACTIVE PROTEIN: CRP: 1.9 mg/dL — ABNORMAL HIGH (ref <1.0)

## 2020-04-05 LAB — BRAIN NATRIURETIC PEPTIDE: B Natriuretic Peptide: 219.3 pg/mL — ABNORMAL HIGH (ref 0.0–100.0)

## 2020-04-05 LAB — MAGNESIUM: Magnesium: 1.7 mg/dL (ref 1.7–2.4)

## 2020-04-05 MED ORDER — GABAPENTIN 100 MG PO CAPS: 100.0000 mg | ORAL_CAPSULE | Freq: Two times a day (BID) | ORAL | Status: DC | PRN

## 2020-04-05 MED ORDER — INSULIN ASPART 100 UNIT/ML ~~LOC~~ SOLN
0.0000 [IU] | Freq: Every day | SUBCUTANEOUS | Status: DC
  Administered 2020-04-05: 2 [IU] via SUBCUTANEOUS

## 2020-04-05 MED ORDER — INSULIN ASPART 100 UNIT/ML ~~LOC~~ SOLN
0.0000 [IU] | Freq: Three times a day (TID) | SUBCUTANEOUS | Status: DC
  Administered 2020-04-05: 8 [IU] via SUBCUTANEOUS
  Administered 2020-04-05: 11 [IU] via SUBCUTANEOUS
  Administered 2020-04-06: 3 [IU] via SUBCUTANEOUS
  Administered 2020-04-06: 5 [IU] via SUBCUTANEOUS

## 2020-04-05 MED ORDER — DOCUSATE SODIUM 100 MG PO CAPS
100.0000 mg | ORAL_CAPSULE | Freq: Two times a day (BID) | ORAL | Status: DC
  Administered 2020-04-05 – 2020-04-06 (×3): 100 mg via ORAL
  Filled 2020-04-05 (×3): qty 1

## 2020-04-05 MED ORDER — INSULIN GLARGINE 100 UNIT/ML ~~LOC~~ SOLN
6.0000 [IU] | Freq: Once | SUBCUTANEOUS | Status: AC
  Administered 2020-04-05: 6 [IU] via SUBCUTANEOUS
  Filled 2020-04-05: qty 0.06

## 2020-04-05 MED ORDER — INSULIN GLARGINE 100 UNIT/ML ~~LOC~~ SOLN
8.0000 [IU] | Freq: Every day | SUBCUTANEOUS | Status: DC
  Administered 2020-04-05: 8 [IU] via SUBCUTANEOUS
  Filled 2020-04-05 (×2): qty 0.08

## 2020-04-05 MED ORDER — SENNOSIDES-DOCUSATE SODIUM 8.6-50 MG PO TABS
1.0000 | ORAL_TABLET | Freq: Two times a day (BID) | ORAL | Status: AC
  Administered 2020-04-05 (×2): 1 via ORAL
  Filled 2020-04-05: qty 1

## 2020-04-05 MED ORDER — INSULIN ASPART 100 UNIT/ML ~~LOC~~ SOLN
5.0000 [IU] | Freq: Three times a day (TID) | SUBCUTANEOUS | Status: DC
  Administered 2020-04-05 – 2020-04-06 (×4): 5 [IU] via SUBCUTANEOUS

## 2020-04-05 NOTE — Plan of Care (Signed)

## 2020-04-05 NOTE — Progress Notes (Signed)
1 Day Post-Op   Subjective/Chief Complaint: Pain improved, but left arm quite swollen and still some burning.  Had large hematoma evacuated yesterday.   Objective: Vital signs in last 24 hours: Temp:  [97 F (36.1 C)-98 F (36.7 C)] 97.8 F (36.6 C) (02/24 0403) Pulse Rate:  [70-80] 70 (02/24 0730) Resp:  [15-22] 16 (02/24 0730) BP: (103-160)/(67-87) 138/67 (02/24 0730) SpO2:  [92 %-98 %] 92 % (02/24 0730) Last BM Date: 04/02/20  Intake/Output from previous day: 02/23 0701 - 02/24 0700 In: 1290 [P.O.:240; I.V.:850; IV Piggyback:200] Out: 2835 [Urine:2825; Blood:10] Intake/Output this shift: No intake/output data recorded.  Left arm with edema.  ? Due to dressing wrap.   Deltoid region much softer.  No evidence of retained hematoma.      Lab Results:  Recent Labs    04/04/20 0136 04/05/20 0050  WBC 8.9 9.7  HGB 12.5* 12.8*  HCT 37.6* 39.3  PLT 305 327   BMET Recent Labs    04/04/20 0136 04/05/20 0050  NA 136 132*  K 3.8 4.5  CL 101 98  CO2 24 20*  GLUCOSE 212* 453*  BUN 18 24*  CREATININE 1.12 1.52*  CALCIUM 9.2 9.1   PT/INR No results for input(s): LABPROT, INR in the last 72 hours. ABG No results for input(s): PHART, HCO3 in the last 72 hours.  Invalid input(s): PCO2, PO2  Studies/Results: No results found.  Anti-infectives: Anti-infectives (From admission, onward)   Start     Dose/Rate Route Frequency Ordered Stop   04/04/20 1400  ceFAZolin (ANCEF) IVPB 2g/100 mL premix        2 g 200 mL/hr over 30 Minutes Intravenous Every 8 hours 04/04/20 1129     04/04/20 0600  ceFAZolin (ANCEF) IVPB 2g/100 mL premix        2 g 200 mL/hr over 30 Minutes Intravenous On call to O.R. 04/03/20 1351 04/05/20 0559      Assessment/Plan: S/p WLE/AFC for left arm melanoma 03/29/2020 S/p takeback for evacuation of hematoma 04/04/2020  Will order duplex for left arm swelling.  Pain improved with oxycodone.  Not taking a significant amount of that.         LOS:  2 days    Stark Klein 04/05/2020

## 2020-04-05 NOTE — Progress Notes (Signed)
Progress Note    [name]  NWG:[id number] DOB: 13-Oct-1934  DOA: 04/01/2020 PCP: Tonia Ghent, MD    Brief Narrative:   Medical records reviewed and are as summarized below:  [name] is an 86 y.o. male with medical history significant ofh/o CAD with last myoview 2014 read as low risk, DM, HTN, recently had left shoulder melanoma surgery by Dr. Barry Dienes few days prior to hospital visit, who reports having chest pressure at 0800 hrs with associated nausea and a feeling of being light-headed, also had ongoing left shoulder pain postop.  He was admitted for chest pain, AKI and ongoing left shoulder discomfort.  He was seen by cardiology and general surgery, s/p  evacuation of hematoma from the left shoulder site on 04/04/2020.  Assessment/Plan:   Active Problems:   Essential hypertension   CAD (coronary artery disease)   BPH with obstruction/lower urinary tract symptoms   HLD (hyperlipidemia)   Type 2 diabetes mellitus with vascular disease (HCC)   Chest pain   Epigastric abdominal pain    Chest pain in a patient with history of CAD. He had non-ACS pattern troponin rise, currently on aspirin, Coreg and statin for secondary prevention.  Now pain and symptom-free -cardiology following    Abdominal pain.   -Completely resolved will place on PPI and monitor.  Lipase is stable.  Exam unremarkable   Dyslipidemia.   -Continue statin at home dose.  Hypertension.   -On Coreg and Norvasc    BPH.   -Continue Flomax.  Recent left shoulder melanoma surgery by Dr. Barry Dienes, some pain and discomfort at the site of surgery needs repeat evacuation on 2/23 -s/p surgery Neurontin PRN -duplex pending of upper extremity   AKI.  -monitor daily  Hypomagnesemia.   -repleted  DM Type II -lantus- increased for hyperglycemia - sliding scale.  obesity Body mass index is 31.69 kg/m.   Family Communication/Anticipated D/C date and plan/Code Status   DVT  prophylaxis: Lovenox ordered. Code Status: dnr   Disposition Plan: Status is: Inpatient  Remains inpatient appropriate because:Inpatient level of care appropriate due to severity of illness   Dispo: The patient is from: Home              Anticipated d/c is to: Home              Anticipated d/c date is: 2 days              Patient currently is not medically stable to d/c.   Difficult to place patient No         Medical Consultants:    General surgery  cards  Subjective:   Swelling in his left hand  Objective:    Vitals:   04/04/20 1927 04/05/20 0012 04/05/20 0403 04/05/20 0730  BP: (!) 141/78 103/87 140/69 138/67  Pulse:   80 70  Resp: 18 (!) 22 16 16   Temp: 97.9 F (36.6 C) 98 F (36.7 C) 97.8 F (36.6 C)   TempSrc: Oral Oral Oral   SpO2:  96% 98% 92%  Weight:      Height:        Intake/Output Summary (Last 24 hours) at 04/05/2020 1054 Last data filed at 04/05/2020 5784 Gross per 24 hour  Intake 490 ml  Output 2825 ml  Net -2335 ml   Filed Weights   04/01/20 1849  Weight: 106 kg    Exam:  General: Appearance:    Obese male in no acute  distress     Lungs:      respirations unlabored  Heart:    Normal heart rate. Normal rhythm. No murmurs, rubs, or gallops.   MS:   Left should wrapped with gauze  Neurologic:   Awake, alert, oriented x 3. No apparent focal neurological           defect.     Data Reviewed:   I have personally reviewed following labs and imaging studies:  Labs: Labs show the following:   Basic Metabolic Panel: Recent Labs  Lab 04/01/20 1058 04/02/20 0748 04/03/20 0127 04/04/20 0136 04/05/20 0050  NA 138 137 134* 136 132*  K 3.6 3.5 3.5 3.8 4.5  CL 100 100 99 101 98  CO2 27 26 26 24  20*  GLUCOSE 208* 221* 296* 212* 453*  BUN 17 22 21 18  24*  CREATININE 1.09 1.46* 1.24 1.12 1.52*  CALCIUM 9.8 9.6 9.0 9.2 9.1  MG  --  1.7 1.6* 1.7 1.7   GFR Estimated Creatinine Clearance: 46.4 mL/min (A) (by C-G formula based on  SCr of 1.52 mg/dL (H)). Liver Function Tests: Recent Labs  Lab 04/01/20 1155 04/02/20 0748 04/03/20 0127 04/04/20 0136 04/05/20 0050  AST 15 15 14* 13* 16  ALT 15 15 15 13 15   ALKPHOS 59 68 61 58 68  BILITOT 0.5 0.9 0.8 0.8 0.5  PROT 6.2* 6.1* 5.7* 5.7* 6.2*  ALBUMIN 3.5 3.4* 3.2* 3.2* 3.3*   Recent Labs  Lab 04/01/20 1155 04/01/20 1458 04/02/20 0748 04/03/20 0127 04/04/20 0136  LIPASE 40 40 42 46 51  AMYLASE  --  42  --   --   --    No results for input(s): AMMONIA in the last 168 hours. Coagulation profile No results for input(s): INR, PROTIME in the last 168 hours.  CBC: Recent Labs  Lab 04/01/20 1058 04/02/20 0748 04/03/20 0127 04/04/20 0136 04/05/20 0050  WBC 9.1 9.6 7.9 8.9 9.7  NEUTROABS  --   --  4.0 4.6 7.6  HGB 14.5 13.6 12.1* 12.5* 12.8*  HCT 44.8 40.7 34.8* 37.6* 39.3  MCV 94.3 92.5 91.6 92.8 94.0  PLT 317 321 296 305 327   Cardiac Enzymes: No results for input(s): CKTOTAL, CKMB, CKMBINDEX, TROPONINI in the last 168 hours. BNP (last 3 results) No results for input(s): PROBNP in the last 8760 hours. CBG: Recent Labs  Lab 04/04/20 1242 04/04/20 1601 04/04/20 2137 04/05/20 0618 04/05/20 0920  GLUCAP 253* 372* 364* 455* 410*   D-Dimer: Recent Labs    04/03/20 0127 04/04/20 0136  DDIMER 0.50 0.49   Hgb A1c: No results for input(s): HGBA1C in the last 72 hours. Lipid Profile: No results for input(s): CHOL, HDL, LDLCALC, TRIG, CHOLHDL, LDLDIRECT in the last 72 hours. Thyroid function studies: No results for input(s): TSH, T4TOTAL, T3FREE, THYROIDAB in the last 72 hours.  Invalid input(s): FREET3 Anemia work up: No results for input(s): VITAMINB12, FOLATE, FERRITIN, TIBC, IRON, RETICCTPCT in the last 72 hours. Sepsis Labs: Recent Labs  Lab 04/02/20 0748 04/03/20 0127 04/04/20 0136 04/05/20 0050  WBC 9.6 7.9 8.9 9.7    Microbiology Recent Results (from the past 240 hour(s))  Resp Panel by RT-PCR (Flu A&B, Covid) Nasopharyngeal  Swab     Status: None   Collection Time: 04/01/20 11:55 AM   Specimen: Nasopharyngeal Swab; Nasopharyngeal(NP) swabs in vial transport medium  Result Value Ref Range Status   SARS Coronavirus 2 by RT PCR NEGATIVE NEGATIVE Final    Comment: (NOTE) SARS-CoV-2  target nucleic acids are NOT DETECTED.  The SARS-CoV-2 RNA is generally detectable in upper respiratory specimens during the acute phase of infection. The lowest concentration of SARS-CoV-2 viral copies this assay can detect is 138 copies/mL. A negative result does not preclude SARS-Cov-2 infection and should not be used as the sole basis for treatment or other patient management decisions. A negative result may occur with  improper specimen collection/handling, submission of specimen other than nasopharyngeal swab, presence of viral mutation(s) within the areas targeted by this assay, and inadequate number of viral copies(<138 copies/mL). A negative result must be combined with clinical observations, patient history, and epidemiological information. The expected result is Negative.  Fact Sheet for Patients:  EntrepreneurPulse.com.au  Fact Sheet for Healthcare Providers:  IncredibleEmployment.be  This test is no t yet approved or cleared by the [place] FDA and  has been authorized for detection and/or diagnosis of SARS-CoV-2 by FDA under an Emergency Use Authorization (EUA). This EUA will remain  in effect (meaning this test can be used) for the duration of the COVID-19 declaration under Section 564(b)(1) of the Act, 21 U.S.C.section 360bbb-3(b)(1), unless the authorization is terminated  or revoked sooner.       Influenza A by PCR NEGATIVE NEGATIVE Final   Influenza B by PCR NEGATIVE NEGATIVE Final    Comment: (NOTE) The Xpert Xpress SARS-CoV-2/FLU/RSV plus assay is intended as an aid in the diagnosis of influenza from Nasopharyngeal swab specimens and should not be used as a sole  basis for treatment. Nasal washings and aspirates are unacceptable for Xpert Xpress SARS-CoV-2/FLU/RSV testing.  Fact Sheet for Patients: EntrepreneurPulse.com.au  Fact Sheet for Healthcare Providers: IncredibleEmployment.be  This test is not yet approved or cleared by the [place] FDA and has been authorized for detection and/or diagnosis of SARS-CoV-2 by FDA under an Emergency Use Authorization (EUA). This EUA will remain in effect (meaning this test can be used) for the duration of the COVID-19 declaration under Section 564(b)(1) of the Act, 21 U.S.C. section 360bbb-3(b)(1), unless the authorization is terminated or revoked.  Performed at [facility], [address], [place], [place] [address]     Procedures and diagnostic studies:  No results found.  Medications:   . amLODipine  10 mg Oral q morning  . aspirin EC  81 mg Oral QPM  . atorvastatin  40 mg Oral Daily  . carvedilol  12.5 mg Oral BID WC  . docusate sodium  100 mg Oral BID  . heparin  5,000 Units Subcutaneous Q8H  . hydrALAZINE  50 mg Oral TID  . insulin aspart  0-15 Units Subcutaneous TID WC  . insulin aspart  0-5 Units Subcutaneous QHS  . insulin aspart  5 Units Subcutaneous TID WC  . insulin glargine  8 Units Subcutaneous QHS  . pantoprazole  40 mg Oral Daily  . potassium chloride SA  20 mEq Oral QPM  . senna-docusate  1 tablet Oral BID  . tamsulosin  0.4 mg Oral QPC supper   Continuous Infusions: . sodium chloride 10 mL/hr at 04/01/20 1500  .  ceFAZolin (ANCEF) IV 2 g (04/05/20 5053)     LOS: 2 days   Geradine Girt  [facility]   How to contact the [facility] Attending or Consulting provider [place] or covering provider during after hours [place], for this patient?  1. Check the care team in [facility] and look for a) attending/consulting TR[facility] provider listed and b) the [facility] team listed 2.  Log into [email or web address] and use [place]'s universal password to  access. If you do not have the password, please contact the hospital operator. 3. Locate the [facility] provider you are looking for under [facility] and page to a number that you can be directly reached. 4. If you still have difficulty reaching the provider, please page the [facility] (Director on Call) for the Hospitalists listed on amion for assistance.  04/05/2020, 10:54 AM

## 2020-04-05 NOTE — Plan of Care (Signed)

## 2020-04-05 NOTE — CV Procedure (Signed)
BUE venous duplex complete. Preliminary findings discussed with April, RN.  Results can be found under chart review under CV PROC. 04/05/2020 4:53 PM Candido Flott RVT, RDMS

## 2020-04-05 NOTE — Progress Notes (Signed)
PT Cancellation Note  Patient Details Name: [name] MRN: [id number] DOB: 08/31/1934   Cancelled Treatment:    Reason Eval/Treat Not Completed: (P) Patient at procedure or test/unavailable Pt currently with [place] for possible DVT. Also has knot about the size of a walnut appear on his neck. MD notified and is coming to look at it. PT will follow back tomorrow.    [place] 04/05/2020, 3:17 PM

## 2020-04-05 NOTE — Progress Notes (Signed)
Dressing changed per wound care orders.

## 2020-04-06 DIAGNOSIS — T148XXA Other injury of unspecified body region, initial encounter: Secondary | ICD-10-CM

## 2020-04-06 LAB — CBC WITH DIFFERENTIAL/PLATELET
Abs Immature Granulocytes: 0.08 10*3/uL — ABNORMAL HIGH (ref 0.00–0.07)
Basophils Absolute: 0.1 10*3/uL (ref 0.0–0.1)
Basophils Relative: 1 %
Eosinophils Absolute: 0.2 10*3/uL (ref 0.0–0.5)
Eosinophils Relative: 2 %
HCT: 34.1 % — ABNORMAL LOW (ref 39.0–52.0)
Hemoglobin: 11.7 g/dL — ABNORMAL LOW (ref 13.0–17.0)
Immature Granulocytes: 1 %
Lymphocytes Relative: 28 %
Lymphs Abs: 2.5 10*3/uL (ref 0.7–4.0)
MCH: 31.6 pg (ref 26.0–34.0)
MCHC: 34.3 g/dL (ref 30.0–36.0)
MCV: 92.2 fL (ref 80.0–100.0)
Monocytes Absolute: 0.9 10*3/uL (ref 0.1–1.0)
Monocytes Relative: 10 %
Neutro Abs: 5.2 10*3/uL (ref 1.7–7.7)
Neutrophils Relative %: 58 %
Platelets: 288 10*3/uL (ref 150–400)
RBC: 3.7 MIL/uL — ABNORMAL LOW (ref 4.22–5.81)
RDW: 13.8 % (ref 11.5–15.5)
WBC: 9 10*3/uL (ref 4.0–10.5)
nRBC: 0 % (ref 0.0–0.2)

## 2020-04-06 LAB — COMPREHENSIVE METABOLIC PANEL
ALT: 19 U/L (ref 0–44)
AST: 27 U/L (ref 15–41)
Albumin: 3.2 g/dL — ABNORMAL LOW (ref 3.5–5.0)
Alkaline Phosphatase: 54 U/L (ref 38–126)
Anion gap: 10 (ref 5–15)
BUN: 23 mg/dL (ref 8–23)
CO2: 25 mmol/L (ref 22–32)
Calcium: 9.1 mg/dL (ref 8.9–10.3)
Chloride: 102 mmol/L (ref 98–111)
Creatinine, Ser: 1.29 mg/dL — ABNORMAL HIGH (ref 0.61–1.24)
GFR, Estimated: 55 mL/min — ABNORMAL LOW (ref >60)
Glucose, Bld: 224 mg/dL — ABNORMAL HIGH (ref 70–99)
Potassium: 3.6 mmol/L (ref 3.5–5.1)
Sodium: 137 mmol/L (ref 135–145)
Total Bilirubin: 0.6 mg/dL (ref 0.3–1.2)
Total Protein: 5.8 g/dL — ABNORMAL LOW (ref 6.5–8.1)

## 2020-04-06 LAB — C-REACTIVE PROTEIN: CRP: 0.9 mg/dL (ref <1.0)

## 2020-04-06 LAB — GLUCOSE, CAPILLARY
Glucose-Capillary: 174 mg/dL — ABNORMAL HIGH (ref 70–99)
Glucose-Capillary: 213 mg/dL — ABNORMAL HIGH (ref 70–99)

## 2020-04-06 LAB — BRAIN NATRIURETIC PEPTIDE: B Natriuretic Peptide: 140.5 pg/mL — ABNORMAL HIGH (ref 0.0–100.0)

## 2020-04-06 LAB — MAGNESIUM: Magnesium: 1.7 mg/dL (ref 1.7–2.4)

## 2020-04-06 MED ORDER — TOUJEO SOLOSTAR 300 UNIT/ML ~~LOC~~ SOPN: 4.0000 [IU] | PEN_INJECTOR | Freq: Every evening | SUBCUTANEOUS | Status: DC

## 2020-04-06 MED ORDER — LISINOPRIL 20 MG PO TABS
20.0000 mg | ORAL_TABLET | Freq: Every day | ORAL | Status: DC
  Administered 2020-04-06: 20 mg via ORAL
  Filled 2020-04-06: qty 1

## 2020-04-06 MED ORDER — ATORVASTATIN CALCIUM 40 MG PO TABS: 40.0000 mg | ORAL_TABLET | Freq: Every day | ORAL | 0 refills | Status: DC

## 2020-04-06 MED ORDER — HYDROCHLOROTHIAZIDE 12.5 MG PO CAPS
12.5000 mg | ORAL_CAPSULE | Freq: Every day | ORAL | Status: DC
  Administered 2020-04-06: 12.5 mg via ORAL
  Filled 2020-04-06: qty 1

## 2020-04-06 NOTE — Progress Notes (Signed)
Arm wound continues to soften.  Swelling improved. Duplex negative for VTE. Discussed with patient and son.  [place] for d/c.

## 2020-04-06 NOTE — TOC Transition Note (Addendum)
Transition of Care [facility]) - CM/SW Discharge Note   Patient Details  Name: MATIX HENSHAW MRN: [id number] Date of Birth: 01/29/1935  Transition of Care [facility]) CM/SW Contact:  Zenon Mayo, RN Phone Number: 04/06/2020, 10:06 AM   Clinical Narrative:    NCM asked Ramond Marrow with [facility] to add [facility] for wound- to  Assess wound at home and assist with supplies. Will need xeroform, gauze and ace/coban daily care per consult received.  Ramond Marrow states no problem they can add HHRN. He will have HHPT and HHRN at dc.    Final next level of care: [place] Barriers to Discharge: Continued Medical Work up   Patient Goals and CMS Choice Patient states their goals for this hospitalization and ongoing recovery are:: get better CMS Medicare.gov Compare Post Acute Care list provided to:: Patient Choice offered to / list presented to : Patient  Discharge Placement                       Discharge Plan and Services                  DME Agency: NA       HH Arranged: PT,RN [place]: [place] ([place]) Date HH Agency Contacted: 04/03/20 Time [place]: 1005 Representative spoke with at [place]: [place] Determinants of Health ([place]) Interventions     Readmission Risk Interventions No flowsheet data found.

## 2020-04-06 NOTE — Plan of Care (Signed)

## 2020-04-06 NOTE — Progress Notes (Signed)
Progress Note  Patient Name: [name] Date of Encounter: 04/06/2020  [facility] Cardiologist: Candee Furbish, MD   Subjective   Feeling well.  Denies any recurrent chest pain. Pain in L arm after hematoma evacuation.   Inpatient Medications    Scheduled Meds: . amLODipine  10 mg Oral q morning  . aspirin EC  81 mg Oral QPM  . atorvastatin  40 mg Oral Daily  . carvedilol  12.5 mg Oral BID WC  . docusate sodium  100 mg Oral BID  . heparin  5,000 Units Subcutaneous Q8H  . hydrALAZINE  50 mg Oral TID  . insulin aspart  0-15 Units Subcutaneous TID WC  . insulin aspart  0-5 Units Subcutaneous QHS  . insulin aspart  5 Units Subcutaneous TID WC  . insulin glargine  8 Units Subcutaneous QHS  . pantoprazole  40 mg Oral Daily  . tamsulosin  0.4 mg Oral QPC supper   Continuous Infusions: . sodium chloride 10 mL/hr at 04/01/20 1500  .  ceFAZolin (ANCEF) IV 200 mL/hr at 04/06/20 0626   PRN Meds: acetaminophen, ALPRAZolam, gabapentin, nitroGLYCERIN, ondansetron (ZOFRAN) IV, oxyCODONE, traMADol   Vital Signs    Vitals:   04/05/20 1912 04/05/20 2317 04/06/20 0311 04/06/20 0807  BP: 140/68 (!) 164/85 (!) 152/86 (!) 156/65  Pulse: 66 60 61 67  Resp: 16 20 20 16   Temp: 98.7 F (37.1 C) 98 F (36.7 C) 97.8 F (36.6 C) 97.8 F (36.6 C)  TempSrc: Oral Oral Oral Oral  SpO2: 98% 98% 98% 96%  Weight:      Height:        Intake/Output Summary (Last 24 hours) at 04/06/2020 0939 Last data filed at 04/06/2020 0626 Gross per 24 hour  Intake 302.77 ml  Output 2550 ml  Net -2247.23 ml   Last 3 Weights 04/01/2020 03/29/2020 03/28/2020  Weight (lbs) 233 lb 11 oz 233 lb 233 lb  Weight (kg) 106 kg 105.688 kg 105.688 kg      Telemetry    Sinus rhythm.  PVCs and PACs- Personally Reviewed  ECG    04/01/2020: Sinus rhythm.  PACs.  Poor R wave progression.  Possible LVH. - Personally Reviewed  Physical Exam   VS:  BP (!) 156/65 (BP Location: Right Arm)   Pulse 67   Temp 97.8  F (36.6 C) (Oral)   Resp 16   Ht 6' (1.829 m)   Wt 106 kg   SpO2 96%   BMI 31.69 kg/m  , BMI Body mass index is 31.69 kg/m. GENERAL:  Well appearing HEENT: Pupils equal round and reactive, fundi not visualized, oral mucosa unremarkable NECK:  No jugular venous distention, waveform within normal limits, carotid upstroke brisk and symmetric LUNGS: Clear to auscultation bilaterally HEART:  RRR.  PMI not displaced or sustained,S1 and S2 within normal limits, no S3, no S4, no clicks, no rubs, no murmurs ABD:  Flat, positive bowel sounds normal in frequency in pitch, no bruits, no rebound, no guarding, no midline pulsatile mass, no hepatomegaly, no splenomegaly EXT:  2 plus pulses throughout, trace edema, no cyanosis no clubbing SKIN:  Extensive ecchymosis of L arm NEURO:  Cranial nerves II through XII grossly intact, motor grossly intact throughout PSYCH:  Cognitively intact, oriented to person place and time   Labs    High Sensitivity Troponin:   Recent Labs  Lab 04/01/20 1058 04/01/20 1155 04/01/20 1458 04/01/20 1856  TROPONINIHS 24* 19* 24* 27*  Chemistry Recent Labs  Lab 04/04/20 0136 04/05/20 0050 04/06/20 0037  NA 136 132* 137  K 3.8 4.5 3.6  CL 101 98 102  CO2 24 20* 25  GLUCOSE 212* 453* 224*  BUN 18 24* 23  CREATININE 1.12 1.52* 1.29*  CALCIUM 9.2 9.1 9.1  PROT 5.7* 6.2* 5.8*  ALBUMIN 3.2* 3.3* 3.2*  AST 13* 16 27  ALT 13 15 19   ALKPHOS 58 68 54  BILITOT 0.8 0.5 0.6  GFRNONAA >60 45* 55*  ANIONGAP 11 14 10      Hematology Recent Labs  Lab 04/04/20 0136 04/05/20 0050 04/06/20 0037  WBC 8.9 9.7 9.0  RBC 4.05* 4.18* 3.70*  HGB 12.5* 12.8* 11.7*  HCT 37.6* 39.3 34.1*  MCV 92.8 94.0 92.2  MCH 30.9 30.6 31.6  MCHC 33.2 32.6 34.3  RDW 13.6 13.7 13.8  PLT 305 327 288    BNP Recent Labs  Lab 04/04/20 0136 04/05/20 0050 04/06/20 0037  BNP 186.3* 219.3* 140.5*     DDimer  Recent Labs  Lab 04/02/20 0748 04/03/20 0127 04/04/20 0136   DDIMER 0.58* 0.50 0.49     Radiology    VAS [place] UPPER EXTREMITY VENOUS DUPLEX  Result Date: 04/05/2020 UPPER VENOUS STUDY  Indications: Area of swelling in left neck Limitations: Poor ultrasound/tissue interface and bandages. Comparison Study: No previous exams Performing Technologist: Rogelia Rohrer  Examination Guidelines: A complete evaluation includes B-mode imaging, spectral Doppler, color Doppler, and power Doppler as needed of all accessible portions of each vessel. Bilateral testing is considered an integral part of a complete examination. Limited examinations for reoccurring indications may be performed as noted.  Right Findings: +----------+------------+---------+-----------+----------+-------+ RIGHT     CompressiblePhasicitySpontaneousPropertiesSummary +----------+------------+---------+-----------+----------+-------+ IJV           Full       Yes       Yes                      +----------+------------+---------+-----------+----------+-------+ Subclavian    Full       Yes       Yes                      +----------+------------+---------+-----------+----------+-------+ Axillary      Full       Yes       Yes                      +----------+------------+---------+-----------+----------+-------+ Brachial      Full       Yes       Yes                      +----------+------------+---------+-----------+----------+-------+ Radial        Full                                          +----------+------------+---------+-----------+----------+-------+ Ulnar         Full                                          +----------+------------+---------+-----------+----------+-------+ Cephalic      Full                                          +----------+------------+---------+-----------+----------+-------+  Basilic       Full       Yes       Yes                      +----------+------------+---------+-----------+----------+-------+  Left Findings:  +----------+------------+---------+-----------+----------+-------+ LEFT      CompressiblePhasicitySpontaneousPropertiesSummary +----------+------------+---------+-----------+----------+-------+ IJV           Full       Yes       Yes                      +----------+------------+---------+-----------+----------+-------+ Subclavian    Full       Yes       Yes                      +----------+------------+---------+-----------+----------+-------+ Axillary      Full       Yes       Yes                      +----------+------------+---------+-----------+----------+-------+ Brachial      Full       Yes       Yes                      +----------+------------+---------+-----------+----------+-------+ Radial        Full                                          +----------+------------+---------+-----------+----------+-------+ Ulnar         Full                                          +----------+------------+---------+-----------+----------+-------+ Cephalic    Partial      No        No                       +----------+------------+---------+-----------+----------+-------+ Basilic       Full       Yes       Yes                      +----------+------------+---------+-----------+----------+-------+ LUE difficult to visualize due to extensive bandage of upper portion of arm and limited mobility - some areas only imaged in transverse. Cephalic vein has chronic appear thrombus in area of AC fossa and slightly below. Only a small area of occlusion.  Summary: No evidence of deep vein thrombosis involving the right and left upper extremities. Right: No evidence of superficial vein thrombosis in the upper extremity.  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  *See table(s) above for measurements and observations.  Diagnosing physician: Jamelle Haring Electronically signed by Jamelle Haring on  04/05/2020 at 8:06:23 PM.    Final     Cardiac Studies   Echo 01/27/14: Study Conclusions   - Left ventricle: The cavity size was mildly dilated. Wall  thickness was increased in a pattern of mild LVH. Systolic  function was mildly reduced. The estimated ejection fraction was  in the range of 45% to 50%. Doppler parameters are consistent  with abnormal left ventricular relaxation (  grade 1 diastolic  dysfunction).  - Left atrium: The atrium was mildly dilated.  - Right ventricle: The cavity size was mildly dilated.  - Right atrium: The atrium was mildly dilated.  - Pulmonary arteries: PA peak pressure: 32 mm Hg (S).   Echo 04/02/20: 1. COmpared to previous report LVEF is mildly worse.  2. LVEF is depressed with severe hypokinesis of the inferiorl  hyopokinesis of the base/mid inferoseptal wall. Left ventricular ejection  fraction, by estimation, is 40 to 45%. The left ventricle has mildly  decreased function. The left ventricular  internal cavity size was moderately dilated. Left ventricular diastolic  parameters are consistent with Grade I diastolic dysfunction (impaired  relaxation).  3. Right ventricular systolic function is normal. The right ventricular  size is normal. There is normal pulmonary artery systolic pressure.  4. The mitral valve is grossly normal. Trivial mitral valve  regurgitation.  5. The aortic valve is normal in structure. Aortic valve regurgitation is  not visualized.   Patient Profile     86 y.o. male with chronic systolic and diastolic heart failure, hypertension, diabetes, hyperlipidemia and melanoma admitted with nausea, vomiting, and chest pain.  Assessment & Plan    # Chest pain: # Elevated troponin: # Hyperlipidemia: Patient admitted with nausea and vomiting as well as chest pain.  Cardiac enzymes were mildly elevated and flat.  Overall thought to be more related to demand ischemia than obstructive coronary disease.  Echo  essentially unchanged from prior.  Prior cath in 2004 was normal, nuclear stress test low risk in 2014.  Continue aspirin, carvedilol and atorvastatin. Chest pain resolved after evacuation of his hematoma.  # Chronic systolic diastolic heart failure: # Hypertensive urgency: LVEF was 35 to 40% in 2014 and 45 to 50% on echo in 2015.  Repeat echo this admission was essentially unchanged, to mildly worse.  LVEF was 40 to 45%.  He has known nonischemic cardiomyopathy.  He is euvolemic and has no exertional chest pain.  No plans for ischemic reevaluation as above.  Blood pressure remains poorly controlled.  Resume home sisinopril 20 mg daily.  His home HCTZ  has been on hold. Resume as renal function stabilizes.  Continue amlodipine, carvedilol, and hydralazine.  [facility] will sign off.   Medication Recommendations:  Resume lisinopril.  Add HCTZ as BP/renal function allow.  Other recommendations (labs, testing, etc):  BMP 1 week Follow up as an outpatient:   We will arrange        For questions or updates, please contact [place] Please consult [email or web address] for contact info under        Signed, Skeet Latch, MD  04/06/2020, 9:39 AM

## 2020-04-06 NOTE — Progress Notes (Signed)
Pt provided with verbal discharge instructions. RN answered all questions. VSS at d/c. IV removed per orders. Dressing changed on left upper extremity. Pt belongings sent with patient. Pt d/c via wheelchair through [employer] entrance to private vehicle.

## 2020-04-06 NOTE — Progress Notes (Signed)
Occupational Therapy Treatment Patient Details Name: [name] MRN: [id number] DOB: May 27, 1934 Today's Date: 04/06/2020    History of present illness [name] is a 86 y.o. male who reports having chest pressure with associated nausea and a feeling of being light-headed, also had ongoing left shoulder pain postop (left shoulder melanoma removal a few days PTA).  He was admitted for chest pain, AKI and ongoing left shoulder discomfort.  PMH:  CAD, DM, HTN, recently had left shoulder melanoma surgery   OT comments  Pt progressing well with OT goals. Able to perform transfers, in room mobility, sink level grooming without physical assist at min guard/supervision level. Required min cues for safety with RW (educated as Pt does not typically use one). Pt hopeful to go home today. LUE most painful in forearm, but functional for ADL. Educated on elevation to assist with edema.   Follow Up Recommendations  No OT follow up    Equipment Recommendations  None recommended by OT (Pt has appropriate DME)    Recommendations for Other Services      Precautions / Restrictions Precautions Precautions: Fall       Mobility Bed Mobility Overal bed mobility: Independent                  Transfers Overall transfer level: Needs assistance Equipment used: Rolling walker (2 wheeled) Transfers: Sit to/from Stand Sit to Stand: Min guard         General transfer comment: min guard for safety and vc for education safety with RW    Balance Overall balance assessment: Needs assistance Sitting-balance support: No upper extremity supported;Feet supported Sitting balance-Leahy Scale: Fair     Standing balance support: Bilateral upper extremity supported;During functional activity Standing balance-Leahy Scale: Poor Standing balance comment: relies on UE support for balance for static stance and dynamic activity.                           ADL either performed or assessed  with clinical judgement   ADL Overall ADL's : Needs assistance/impaired     Grooming: Wash/dry hands;Oral care;Supervision/safety;Standing Grooming Details (indicate cue type and reason): sink level, cues for safety with RW         Upper Body Dressing : Sitting;Supervision/safety Upper Body Dressing Details (indicate cue type and reason): educated to dress LUE first     Toilet Transfer: Min guard;Ambulation;RW   Toileting- Water quality scientist and Hygiene: Min guard;Sit to/from stand       Functional mobility during ADLs: Min guard;Rolling walker General ADL Comments: LUE very edemous and sore- but surprisingly functional     Vision       Perception     Praxis      Cognition Arousal/Alertness: Awake/alert Behavior During Therapy: WFL for tasks assessed/performed Overall Cognitive Status: Within Functional Limits for tasks assessed                                          Exercises     Shoulder Instructions       General Comments      Pertinent Vitals/ Pain       Pain Assessment: Faces Faces Pain Scale: Hurts even more Pain Location: left forearm Pain Descriptors / Indicators: Aching;Grimacing;Guarding;Discomfort Pain Intervention(s): Limited activity within patient's tolerance;Monitored during session;Other (comment) (elevation)  Home Living  Prior Functioning/Environment              Frequency  Min 2X/week        Progress Toward Goals  OT Goals(current goals can now be found in the care plan section)  Progress towards OT goals: Progressing toward goals  Acute Rehab OT Goals Patient Stated Goal: go home OT Goal Formulation: With patient Time For Goal Achievement: 04/17/20 Potential to Achieve Goals: Good  Plan Discharge plan remains appropriate    Co-evaluation                 AM-PAC OT "6 Clicks" Daily Activity     Outcome Measure   Help from  another person eating meals?: None Help from another person taking care of personal grooming?: A Little Help from another person toileting, which includes using toliet, bedpan, or urinal?: A Little Help from another person bathing (including washing, rinsing, drying)?: A Little Help from another person to put on and taking off regular upper body clothing?: A Little Help from another person to put on and taking off regular lower body clothing?: A Little 6 Click Score: 19    End of Session Equipment Utilized During Treatment: Rolling walker  OT Visit Diagnosis: Other abnormalities of gait and mobility (R26.89);Muscle weakness (generalized) (M62.81);Pain Pain - Right/Left: Left Pain - part of body: Shoulder   Activity Tolerance Patient tolerated treatment well   Patient Left with call bell/phone within reach;in chair;with family/visitor present   Nurse Communication Mobility status;Precautions (Pt has clean sheets)        Time: 1100-1115 OT Time Calculation (min): 15 min  Charges: OT General Charges $OT Visit: 1 Visit OT Treatments $Self Care/Home Management : 8-22 mins  Jesse Sans OTR/L Acute Rehabilitation Services Pager: [phone] Office: [place] 04/06/2020, 12:33 PM

## 2020-04-06 NOTE — Discharge Summary (Addendum)
Physician Discharge Summary  [name] KVQ:[id number] DOB: 10/15/1934 DOA: 04/01/2020  PCP: Tonia Ghent, MD  Admit date: 04/01/2020 Discharge date: 04/06/2020  Admitted From: home Discharge disposition: home   Recommendations for Outpatient Follow-Up:   1. BMP 1 week 2. Statin continued, unclear if was taking at home 3. Home health 4. Wound care per GS:Ok to shower starting 2/25. Change dressing daily with xeroform, gauze, and tape or adhesive dressing. If xeroform or vaseline gauze is not available, apply antibiotic ointment/polysporin/bacitracin to raw areas of wound and non stick gauze under the plain gauze and tape.   No lifting or strenuous activity with left arm for 2 weeks.    Discharge Diagnosis:   Active Problems:   Essential hypertension   CAD (coronary artery disease)   BPH with obstruction/lower urinary tract symptoms   HLD (hyperlipidemia)   Type 2 diabetes mellitus with vascular disease (HCC)   Chest pain   Epigastric abdominal pain   Hematoma    Discharge Condition: Improved.  Diet recommendation: Low sodium, heart healthy.  Carbohydrate-modified.   Code status: DNR   History of Present Illness:   [name] is a 86 y.o. male with medical history significant of h/o CAD with last myoview 2014 read as low risk, DM, HTN, who reports having chest pressure at 0800 hrs with associated nausea and a feeling of being light-headed. No diaphoresis, tachycardia. He has multiple risk factors including advanced age, DM, HTN, obesity. He presents to [facility] for evaluation.    ED Course: T 98.1 147/82  HR 91  RR 26. Covid NEGATIVE. Cmet with glucose 208, Cr 1.09, CBCD nl, EKG with NSR, PACs no STEMI, old anterior injury. CXR NAD. D-dimer 0.42. EDP consulted with Dr. Debara Pickett for cardiology who recommended admission with cardiology to consult and determine appropriate risk stratification. TR[facility] called to admit patient.    Hospital Course by Problem:    Chest pain in a patient with history of CAD. He had non-ACS pattern troponin rise, currently on aspirin, Coreg and statin for secondary prevention. Now pain and symptom-free -cardiology has seen   Abdominal pain.  -resolved   Dyslipidemia.  -Continue statin  Hypertension.  -On Coreg and Norvasc    BPH.  -Continue Flomax.  Recent left shoulder melanoma surgery by Dr. Barry Dienes, some pain and discomfort at the [place] surgery needs repeat evacuation on 2/23 -s/p surgery -duplex pending of upper extremity - chronic superficial thrombophlebitis  AKI.  -outpatient follow up  Hypomagnesemia.  -repleted  DM Type II -lantus- increased for hyperglycemia - sliding scale.  obesity Body mass index is 31.69 kg/m.     Medical Consultants:   Cards General surgery   Discharge Exam:   Vitals:   04/06/20 0807 04/06/20 1112  BP: (!) 156/65 (!) 153/80  Pulse: 67 68  Resp: 16   Temp: 97.8 F (36.6 C)   SpO2: 96%    Vitals:   04/05/20 2317 04/06/20 0311 04/06/20 0807 04/06/20 1112  BP: (!) 164/85 (!) 152/86 (!) 156/65 (!) 153/80  Pulse: 60 61 67 68  Resp: 20 20 16    Temp: 98 F (36.7 C) 97.8 F (36.6 C) 97.8 F (36.6 C)   TempSrc: Oral Oral Oral   SpO2: 98% 98% 96%   Weight:      Height:        General exam: Appears calm and comfortable.    The results of significant diagnostics from this hospitalization (including imaging, microbiology, ancillary and  laboratory) are listed below for reference.     Procedures and Diagnostic Studies:   DG Chest 2 View  Result Date: 04/01/2020 CLINICAL DATA:  85 year old male with a history of shortness of breath EXAM: CHEST - 2 VIEW COMPARISON:  03/16/2020 FINDINGS: Cardiomediastinal silhouette unchanged in size and contour. No central vascular congestion. No interlobular septal thickening. No pneumothorax. No pleural effusion. No confluent airspace disease. Degenerative changes of the spine.  No displaced  fracture IMPRESSION: Chronic lung changes without evidence of acute cardiopulmonary disease. Electronically Signed   By: Corrie Mckusick D.O.   On: 04/01/2020 10:53   DG Chest Port 1 View  Result Date: 04/02/2020 CLINICAL DATA:  Shortness of breath. EXAM: PORTABLE CHEST 1 VIEW COMPARISON:  04/01/2020 FINDINGS: Patient is partially rotated to the left. Heart size is unchanged given patient positioning. Aortic atherosclerotic calcification noted. Both lungs are clear. Surgical clips again noted in left axilla. IMPRESSION: No active disease. Electronically Signed   By: Marlaine Hind M.D.   On: 04/02/2020 08:10   ECHOCARDIOGRAM COMPLETE  Result Date: 04/02/2020    ECHOCARDIOGRAM REPORT   Patient Name:   [name] Date of Exam: 04/02/2020 Medical Rec #:  [id number]       Height:       72.0 in Accession #:    [id number]      Weight:       233.7 lb Date of Birth:  08-28-1934      BSA:          2.276 m Patient Age:    56 years        BP:           132/67 mmHg Patient Gender: M               HR:           84 bpm. Exam Location:  Inpatient Procedure: 2D Echo, Cardiac Doppler, Color Doppler and Intracardiac            Opacification Agent Indications:    Elevated Troponin  History:        Patient has prior history of Echocardiogram examinations, most                 recent 01/27/2014. CHF, Arrythmias:PVC, Signs/Symptoms:Chest                 Pain; Risk Factors:Hypertension and Dyslipidemia.  Sonographer:    Darlina Sicilian RDCS Referring Phys: [id number] TIFFANY [place] IMPRESSIONS  1. COmpared to previous report LVEF is mildly worse.  2. LVEF is depressed with severe hypokinesis of the inferiorl hyopokinesis of the base/mid inferoseptal wall. Left ventricular ejection fraction, by estimation, is 40 to 45%. The left ventricle has mildly decreased function. The left ventricular internal cavity size was moderately dilated. Left ventricular diastolic parameters are consistent with Grade I diastolic dysfunction (impaired  relaxation).  3. Right ventricular systolic function is normal. The right ventricular size is normal. There is normal pulmonary artery systolic pressure.  4. The mitral valve is grossly normal. Trivial mitral valve regurgitation.  5. The aortic valve is normal in structure. Aortic valve regurgitation is not visualized. FINDINGS  Left Ventricle: LVEF is depressed with severe hypokinesis of the inferiorl hyopokinesis of the base/mid inferoseptal wall. Left ventricular ejection fraction, by estimation, is 40 to 45%. The left ventricle has mildly decreased function. Definity contrast agent was given IV to delineate the left ventricular endocardial borders. The left ventricular internal cavity size was moderately dilated. There  is no left ventricular hypertrophy. Left ventricular diastolic parameters are consistent with Grade  I diastolic dysfunction (impaired relaxation). Right Ventricle: The right ventricular size is normal. Right vetricular wall thickness was not assessed. Right ventricular systolic function is normal. There is normal pulmonary artery systolic pressure. The tricuspid regurgitant velocity is 2.36 m/s, and with an assumed right atrial pressure of 3 mmHg, the estimated right ventricular systolic pressure is 09.3 mmHg. Left Atrium: Left atrial size was normal in size. Right Atrium: Right atrial size was normal in size. Pericardium: There is no evidence of pericardial effusion. Mitral Valve: The mitral valve is grossly normal. Trivial mitral valve regurgitation. Tricuspid Valve: The tricuspid valve is normal in structure. Tricuspid valve regurgitation is trivial. Aortic Valve: The aortic valve is normal in structure. Aortic valve regurgitation is not visualized. Pulmonic Valve: The pulmonic valve was not well visualized. Pulmonic valve regurgitation is not visualized. Aorta: The aortic root is normal in size and structure. IAS/Shunts: The interatrial septum was not assessed.  LEFT VENTRICLE PLAX 2D LVIDd:          5.75 cm      Diastology LVIDs:         4.50 cm      LV e' medial:    6.09 cm/s LV PW:         0.85 cm      LV E/e' medial:  11.1 LV IVS:        1.10 cm      LV e' lateral:   8.49 cm/s LVOT diam:     2.30 cm      LV E/e' lateral: 7.9 LV SV:         99 LV SV Index:   43 LVOT Area:     4.15 cm  LV Volumes (MOD) LV vol d, MOD A2C: 124.9 ml LV vol d, MOD A4C: 190.5 ml LV vol s, MOD A2C: 69.4 ml LV vol s, MOD A4C: 89.6 ml LV SV MOD A2C:     55.6 ml LV SV MOD A4C:     190.5 ml LV SV MOD BP:      73.9 ml RIGHT VENTRICLE RV S prime:     10.80 cm/s TAPSE (M-mode): 1.8 cm LEFT ATRIUM             Index       RIGHT ATRIUM           Index LA diam:        4.30 cm 1.89 cm/m  RA Area:     10.70 cm LA Vol (A2C):   49.6 ml 21.79 ml/m RA Volume:   17.20 ml  7.56 ml/m LA Vol (A4C):   63.2 ml 27.76 ml/m LA Biplane Vol: 55.3 ml 24.29 ml/m  AORTIC VALVE LVOT Vmax:   134.50 cm/s LVOT Vmean:  83.550 cm/s LVOT VTI:    0.238 m  AORTA Ao Root diam: 3.80 cm MITRAL VALVE               TRICUSPID VALVE MV Area (PHT): 3.99 cm    TR Peak grad:   22.3 mmHg MV Decel Time: 190 msec    TR Vmax:        236.00 cm/s MV E velocity: 67.30 cm/s MV A velocity: 93.00 cm/s  SHUNTS MV E/A ratio:  0.72        Systemic VTI:  0.24 m  Systemic Diam: 2.30 cm Dorris Carnes MD Electronically signed by Dorris Carnes MD Signature Date/Time: 04/02/2020/5:00:41 PM    Final      Labs:   Basic Metabolic Panel: Recent Labs  Lab 04/02/20 0748 04/03/20 0127 04/04/20 0136 04/05/20 0050 04/06/20 0037  NA 137 134* 136 132* 137  K 3.5 3.5 3.8 4.5 3.6  CL 100 99 101 98 102  CO2 26 26 24  20* 25  GLUCOSE 221* 296* 212* 453* 224*  BUN 22 21 18  24* 23  CREATININE 1.46* 1.24 1.12 1.52* 1.29*  CALCIUM 9.6 9.0 9.2 9.1 9.1  MG 1.7 1.6* 1.7 1.7 1.7   GFR Estimated Creatinine Clearance: 54.6 mL/min (A) (by C-G formula based on SCr of 1.29 mg/dL (H)). Liver Function Tests: Recent Labs  Lab 04/02/20 0748 04/03/20 0127 04/04/20 0136  04/05/20 0050 04/06/20 0037  AST 15 14* 13* 16 27  ALT 15 15 13 15 19   ALKPHOS 68 61 58 68 54  BILITOT 0.9 0.8 0.8 0.5 0.6  PROT 6.1* 5.7* 5.7* 6.2* 5.8*  ALBUMIN 3.4* 3.2* 3.2* 3.3* 3.2*   Recent Labs  Lab 04/01/20 1155 04/01/20 1458 04/02/20 0748 04/03/20 0127 04/04/20 0136  LIPASE 40 40 42 46 51  AMYLASE  --  42  --   --   --    No results for input(s): AMMONIA in the last 168 hours. Coagulation profile No results for input(s): INR, PROTIME in the last 168 hours.  CBC: Recent Labs  Lab 04/02/20 0748 04/03/20 0127 04/04/20 0136 04/05/20 0050 04/06/20 0037  WBC 9.6 7.9 8.9 9.7 9.0  NEUTROABS  --  4.0 4.6 7.6 5.2  HGB 13.6 12.1* 12.5* 12.8* 11.7*  HCT 40.7 34.8* 37.6* 39.3 34.1*  MCV 92.5 91.6 92.8 94.0 92.2  PLT 321 296 305 327 288   Cardiac Enzymes: No results for input(s): CKTOTAL, CKMB, CKMBINDEX, TROPONINI in the last 168 hours. BNP: Invalid input(s): POCBNP CBG: Recent Labs  Lab 04/05/20 1118 04/05/20 1616 04/05/20 2157 04/06/20 0617 04/06/20 1110  GLUCAP 360* 254* 218* 174* 213*   D-Dimer Recent Labs    04/04/20 0136  DDIMER 0.49   Hgb A1c No results for input(s): HGBA1C in the last 72 hours. Lipid Profile No results for input(s): CHOL, HDL, LDLCALC, TRIG, CHOLHDL, LDLDIRECT in the last 72 hours. Thyroid function studies No results for input(s): TSH, T4TOTAL, T3FREE, THYROIDAB in the last 72 hours.  Invalid input(s): FREET3 Anemia work up No results for input(s): VITAMINB12, FOLATE, FERRITIN, TIBC, IRON, RETICCTPCT in the last 72 hours. Microbiology Recent Results (from the past 240 hour(s))  Resp Panel by RT-PCR (Flu A&B, Covid) Nasopharyngeal Swab     Status: None   Collection Time: 04/01/20 11:55 AM   Specimen: Nasopharyngeal Swab; Nasopharyngeal(NP) swabs in vial transport medium  Result Value Ref Range Status   SARS Coronavirus 2 by RT PCR NEGATIVE NEGATIVE Final    Comment: (NOTE) SARS-CoV-2 target nucleic acids are NOT  DETECTED.  The SARS-CoV-2 RNA is generally detectable in upper respiratory specimens during the acute phase of infection. The lowest concentration of SARS-CoV-2 viral copies this assay can detect is 138 copies/mL. A negative result does not preclude SARS-Cov-2 infection and should not be used as the sole basis for treatment or other patient management decisions. A negative result may occur with  improper specimen collection/handling, submission of specimen other than nasopharyngeal swab, presence of viral mutation(s) within the areas targeted by this assay, and inadequate number of viral copies(<138 copies/mL). A negative  result must be combined with clinical observations, patient history, and epidemiological information. The expected result is Negative.  Fact Sheet for Patients:  EntrepreneurPulse.com.au  Fact Sheet for Healthcare Providers:  IncredibleEmployment.be  This test is no t yet approved or cleared by the [place] FDA and  has been authorized for detection and/or diagnosis of SARS-CoV-2 by FDA under an Emergency Use Authorization (EUA). This EUA will remain  in effect (meaning this test can be used) for the duration of the COVID-19 declaration under Section 564(b)(1) of the Act, 21 U.S.C.section 360bbb-3(b)(1), unless the authorization is terminated  or revoked sooner.       Influenza A by PCR NEGATIVE NEGATIVE Final   Influenza B by PCR NEGATIVE NEGATIVE Final    Comment: (NOTE) The Xpert Xpress SARS-CoV-2/FLU/RSV plus assay is intended as an aid in the diagnosis of influenza from Nasopharyngeal swab specimens and should not be used as a sole basis for treatment. Nasal washings and aspirates are unacceptable for Xpert Xpress SARS-CoV-2/FLU/RSV testing.  Fact Sheet for Patients: EntrepreneurPulse.com.au  Fact Sheet for Healthcare Providers: IncredibleEmployment.be  This test is not yet  approved or cleared by the [place] FDA and has been authorized for detection and/or diagnosis of SARS-CoV-2 by FDA under an Emergency Use Authorization (EUA). This EUA will remain in effect (meaning this test can be used) for the duration of the COVID-19 declaration under Section 564(b)(1) of the Act, 21 U.S.C. section 360bbb-3(b)(1), unless the authorization is terminated or revoked.  Performed at [facility], [place] [address], [place], [place] [address]      Discharge Instructions:   Discharge Instructions    Diet - low sodium heart healthy   Complete by: As directed    Diet Carb Modified   Complete by: As directed    Discharge wound care:   Complete by: As directed    Dressing care: Ok to shower starting 2/25. Change dressing daily with xeroform, gauze, and tape or adhesive dressing.  If xeroform or vaseline gauze is not available, apply antibiotic ointment/polysporin/bacitracin to raw areas of wound and non stick gauze under the plain gauze and tape.    No lifting or strenuous activity with left arm for 2 weeks.   Increase activity slowly   Complete by: As directed      Allergies as of 04/06/2020      Reactions   Codeine Phosphate Nausea Only, Other (See Comments)   Can take with food and usually doesn't cause nausea   Morphine Nausea Only, Other (See Comments)   Can take with food and usually doesn't cause nausea   Metformin And Related Diarrhea      Medication List    STOP taking these medications   meclizine 25 MG tablet Commonly known as: ANTIVERT     TAKE these medications   Accu-Chek Aviva Plus w/Device Kit Check sugar up to 3 times daily. E11.59  Insulin dependent   Accu-Chek Aviva Soln Use as directed monthly. E11.59  Insulin dependent What changed:   how much to take  how to take this  when to take this   Accu-Chek Softclix Lancets lancets TEST BLOOD SUGAR UP TO THREE TIMES DAILY What changed:   how much to take  how to take  this  when to take this  additional instructions   amLODipine 10 MG tablet Commonly known as: NORVASC Take 1 tablet (10 mg total) by mouth every morning.   aspirin EC 81 MG tablet Take 81 mg by mouth  every evening.   atorvastatin 40 MG tablet Commonly known as: LIPITOR Take 1 tablet (40 mg total) by mouth daily. Start taking on: April 07, 2020   B-D 3CC LUER-LOK SYR 25GX1" 25G X 1" 3 ML Misc Generic drug: SYRINGE-NEEDLE (DISP) 3 ML USE TO INJECT VITAMIN B12 MONTHLY. What changed:   how much to take  how to take this  when to take this   carvedilol 12.5 MG tablet Commonly known as: COREG TAKE 1 TABLET TWICE DAILY WITH MEALS What changed:   how much to take  how to take this  when to take this  additional instructions   COD LIVER OIL PO Take 1 capsule by mouth 2 (two) times daily.   cyanocobalamin 1000 MCG/ML injection Commonly known as: (VITAMIN B-12) Inject 1 mL (1,000 mcg total) into the muscle every 30 (thirty) days. Dispense 3 of the 1 mL vials.   Droplet Pen Needles 30G X 8 MM Misc Generic drug: Insulin Pen Needle USE  WITH  INSULIN  PEN What changed: See the new instructions.   glimepiride 4 MG tablet Commonly known as: AMARYL Take 1/2 tablet every day with breakfast What changed:   how much to take  how to take this  when to take this  additional instructions   hydrALAZINE 50 MG tablet Commonly known as: APRESOLINE Take 1 tablet (50 mg total) by mouth 3 (three) times daily.   insulin lispro 100 UNIT/ML KwikPen Commonly known as: HumaLOG KwikPen Inject 0.04-0.1 mLs (4-10 Units total) into the skin 3 (three) times daily. INJECT INTO SKIN 3 TIMES DAILY BEFORE MEALS USING SLIDING SCALE. 151 TO 200 = 4 UNITS, 201 TO 250= 6 UNITS, 251 TO 300= 8 UNITS, > 300 = 10 units What changed: when to take this   lisinopril-hydrochlorothiazide 20-25 MG tablet Commonly known as: ZESTORETIC Take 1 tablet by mouth every morning.   OneTouch Ultra  test strip Generic drug: glucose blood Use as instructed What changed:   how much to take  how to take this  when to take this  additional instructions   oxyCODONE 5 MG immediate release tablet Commonly known as: Oxy IR/ROXICODONE Take 5 mg by mouth every 4 (four) hours as needed for severe pain.   potassium chloride SA 20 MEQ tablet Commonly known as: Klor-Con M20 Take 1 tablet (20 mEq total) by mouth daily. What changed: when to take this   tamsulosin 0.4 MG Caps capsule Commonly known as: FLOMAX TAKE 1 CAPSULE EVERY DAY  AFTER  SUPPER What changed:   how much to take  how to take this  when to take this  additional instructions   [name] SoloStar 300 UNIT/ML Solostar Pen Generic drug: insulin glargine (1 Unit Dial) Inject 4-8 Units into the skin at bedtime.   traMADol 50 MG tablet Commonly known as: ULTRAM Take 1 tablet (50 mg total) by mouth every 6 (six) hours as needed for moderate pain or severe pain. What changed: Another medication with the same name was removed. Continue taking this medication, and follow the directions you see here.            Discharge Care Instructions  (From admission, onward)         Start     Ordered   04/06/20 0000  Discharge wound care:       Comments: Dressing care: Ok to shower starting 2/25. Change dressing daily with xeroform, gauze, and tape or adhesive dressing.  If xeroform or vaseline gauze is not  available, apply antibiotic ointment/polysporin/bacitracin to raw areas of wound and non stick gauze under the plain gauze and tape.    No lifting or strenuous activity with left arm for 2 weeks.   04/06/20 1355          Follow-up Information    Stark Klein, MD In 2 weeks.   Specialty: General Surgery Contact information: [address] [place] [address] [phone]        Richardson Dopp T, PA-C Follow up on 05/07/2020.   Specialties: Cardiology, Physician Assistant Why: at 9:15am   Contact information: [address] [address] [phone]        Tonia Ghent, MD Follow up in 1 week(s).   Specialty: Family Medicine Contact information: [place] [address] [phone]        Jerline Pain, MD .   Specialty: Cardiology Contact information: [phone] [place] [address] [phone]                Time coordinating discharge: 35 min  Signed:  Geradine Girt DO  [facility] 04/06/2020, 1:55 PM

## 2020-04-08 DIAGNOSIS — I5042 Chronic combined systolic (congestive) and diastolic (congestive) heart failure: Secondary | ICD-10-CM | POA: Diagnosis not present

## 2020-04-08 DIAGNOSIS — C4362 Malignant melanoma of left upper limb, including shoulder: Secondary | ICD-10-CM | POA: Diagnosis not present

## 2020-04-08 DIAGNOSIS — I2510 Atherosclerotic heart disease of native coronary artery without angina pectoris: Secondary | ICD-10-CM | POA: Diagnosis not present

## 2020-04-08 DIAGNOSIS — E119 Type 2 diabetes mellitus without complications: Secondary | ICD-10-CM | POA: Diagnosis not present

## 2020-04-08 DIAGNOSIS — G253 Myoclonus: Secondary | ICD-10-CM | POA: Diagnosis not present

## 2020-04-08 DIAGNOSIS — L7631 Postprocedural hematoma of skin and subcutaneous tissue following a dermatologic procedure: Secondary | ICD-10-CM | POA: Diagnosis not present

## 2020-04-08 DIAGNOSIS — N138 Other obstructive and reflux uropathy: Secondary | ICD-10-CM | POA: Diagnosis not present

## 2020-04-08 DIAGNOSIS — I429 Cardiomyopathy, unspecified: Secondary | ICD-10-CM | POA: Diagnosis not present

## 2020-04-08 DIAGNOSIS — K5730 Diverticulosis of large intestine without perforation or abscess without bleeding: Secondary | ICD-10-CM | POA: Diagnosis not present

## 2020-04-08 DIAGNOSIS — I110 Hypertensive heart disease with heart failure: Secondary | ICD-10-CM | POA: Diagnosis not present

## 2020-04-08 DIAGNOSIS — M1990 Unspecified osteoarthritis, unspecified site: Secondary | ICD-10-CM | POA: Diagnosis not present

## 2020-04-09 ENCOUNTER — Telehealth: Payer: Self-pay

## 2020-04-09 ENCOUNTER — Telehealth: Payer: Self-pay | Admitting: Family Medicine

## 2020-04-09 NOTE — Telephone Encounter (Signed)
hes coming 3/4 at 1130 am

## 2020-04-09 NOTE — Telephone Encounter (Signed)
Noted. Thanks.

## 2020-04-09 NOTE — Telephone Encounter (Signed)
Transition Care Management Unsuccessful Follow-up Telephone Call  Date of discharge and from where:  04/06/2020, [name]  Attempts:  2nd Attempt  Reason for unsuccessful TCM follow-up call:  Left voice message

## 2020-04-09 NOTE — Telephone Encounter (Signed)
-----   Message from Tonia Ghent, MD sent at 04/08/2020  1:12 PM EST ----- Please call pt.  Needs inpatient f/u.  Thanks.    Brigitte Pulse

## 2020-04-09 NOTE — Telephone Encounter (Signed)
Transition Care Management Unsuccessful Follow-up Telephone Call  Date of discharge and from where:  04/06/2020, [name]  Attempts:  1st Attempt  Reason for unsuccessful TCM follow-up call:  Left voice message

## 2020-04-10 ENCOUNTER — Telehealth: Payer: Self-pay

## 2020-04-10 DIAGNOSIS — I110 Hypertensive heart disease with heart failure: Secondary | ICD-10-CM | POA: Diagnosis not present

## 2020-04-10 DIAGNOSIS — I429 Cardiomyopathy, unspecified: Secondary | ICD-10-CM | POA: Diagnosis not present

## 2020-04-10 DIAGNOSIS — N138 Other obstructive and reflux uropathy: Secondary | ICD-10-CM | POA: Diagnosis not present

## 2020-04-10 DIAGNOSIS — M1990 Unspecified osteoarthritis, unspecified site: Secondary | ICD-10-CM | POA: Diagnosis not present

## 2020-04-10 DIAGNOSIS — I5042 Chronic combined systolic (congestive) and diastolic (congestive) heart failure: Secondary | ICD-10-CM | POA: Diagnosis not present

## 2020-04-10 DIAGNOSIS — K5730 Diverticulosis of large intestine without perforation or abscess without bleeding: Secondary | ICD-10-CM | POA: Diagnosis not present

## 2020-04-10 DIAGNOSIS — C4362 Malignant melanoma of left upper limb, including shoulder: Secondary | ICD-10-CM | POA: Diagnosis not present

## 2020-04-10 DIAGNOSIS — E119 Type 2 diabetes mellitus without complications: Secondary | ICD-10-CM | POA: Diagnosis not present

## 2020-04-10 DIAGNOSIS — G253 Myoclonus: Secondary | ICD-10-CM | POA: Diagnosis not present

## 2020-04-10 DIAGNOSIS — L7631 Postprocedural hematoma of skin and subcutaneous tissue following a dermatologic procedure: Secondary | ICD-10-CM | POA: Diagnosis not present

## 2020-04-10 DIAGNOSIS — I2510 Atherosclerotic heart disease of native coronary artery without angina pectoris: Secondary | ICD-10-CM | POA: Diagnosis not present

## 2020-04-10 NOTE — Telephone Encounter (Signed)
Surveyor, minerals with [facility] left v/m requesting verbal orders for [facility] skilled nursing 1 x a wk for 4 wks.

## 2020-04-10 NOTE — Telephone Encounter (Signed)
Transition Care Management Follow-up Telephone Call  Date of discharge and from where: [facility]   How have you been since you were released from the hospital? Patient states that he is doing fine.   Any questions or concerns? No  Items Reviewed:  Did the pt receive and understand the discharge instructions provided? Yes   Medications obtained and verified? Yes   Other? No   Any new allergies since your discharge? No   Dietary orders reviewed? Yes  Do you have support at home? Yes   Home Care and Equipment/Supplies: Were home health services ordered? yes If so, what is the name of the agency? [facility] set up a time to come to the patient's home? yes Were any new equipment or medical supplies ordered?  No What is the name of the medical supply agency? N/A Were you able to get the supplies/equipment? not applicable Do you have any questions related to the use of the equipment or supplies? No  Functional Questionnaire: (I = Independent and D = Dependent) ADLs: I  Bathing/Dressing- I  Meal Prep- I  Eating- I  Maintaining continence- I  Transferring/Ambulation- I  Managing Meds- I  Follow up appointments reviewed:   PCP Hospital f/u appt confirmed? Yes  Scheduled to see Dr. Damita Dunnings on 04/13/2020 @ 11:30 am.  [facility] f/u appt confirmed? Yes  Scheduled to see cardiology on 05/07/2020   Are transportation arrangements needed? No   If their condition worsens, is the pt aware to call PCP or go to the Emergency Dept.? Yes  Was the patient provided with contact information for the PCP's office or ED? Yes  Was to pt encouraged to call back with questions or concerns? Yes

## 2020-04-10 NOTE — Telephone Encounter (Signed)
Please give the order.  Thanks.   

## 2020-04-10 NOTE — Telephone Encounter (Signed)
Contacted [name] and gave verbal order. [name] verbalized understanding.

## 2020-04-10 NOTE — Telephone Encounter (Signed)
[name] PT with [facility] left v/m requesting verbal orders for [facility] PT 2 x a wk for 3 wks for muscle weakness and impaired balance.

## 2020-04-11 NOTE — Telephone Encounter (Signed)
Please give the order.  Thanks.   

## 2020-04-11 NOTE — Telephone Encounter (Signed)
Called Amy and gave verbal orders for [facility] skilled nursing as requested

## 2020-04-13 ENCOUNTER — Encounter: Payer: Self-pay | Admitting: Family Medicine

## 2020-04-13 ENCOUNTER — Ambulatory Visit (INDEPENDENT_AMBULATORY_CARE_PROVIDER_SITE_OTHER): Payer: Medicare Other | Admitting: Family Medicine

## 2020-04-13 ENCOUNTER — Other Ambulatory Visit: Payer: Self-pay

## 2020-04-13 VITALS — BP 118/58 | HR 81 | Temp 98.9°F | Ht 72.0 in | Wt 233.0 lb

## 2020-04-13 DIAGNOSIS — Z66 Do not resuscitate: Secondary | ICD-10-CM | POA: Diagnosis not present

## 2020-04-13 DIAGNOSIS — N179 Acute kidney failure, unspecified: Secondary | ICD-10-CM

## 2020-04-13 DIAGNOSIS — E119 Type 2 diabetes mellitus without complications: Secondary | ICD-10-CM | POA: Diagnosis not present

## 2020-04-13 DIAGNOSIS — N138 Other obstructive and reflux uropathy: Secondary | ICD-10-CM | POA: Diagnosis not present

## 2020-04-13 DIAGNOSIS — I2510 Atherosclerotic heart disease of native coronary artery without angina pectoris: Secondary | ICD-10-CM | POA: Diagnosis not present

## 2020-04-13 DIAGNOSIS — I10 Essential (primary) hypertension: Secondary | ICD-10-CM | POA: Diagnosis not present

## 2020-04-13 DIAGNOSIS — C4362 Malignant melanoma of left upper limb, including shoulder: Secondary | ICD-10-CM | POA: Diagnosis not present

## 2020-04-13 DIAGNOSIS — C439 Malignant melanoma of skin, unspecified: Secondary | ICD-10-CM | POA: Diagnosis not present

## 2020-04-13 DIAGNOSIS — L7631 Postprocedural hematoma of skin and subcutaneous tissue following a dermatologic procedure: Secondary | ICD-10-CM | POA: Diagnosis not present

## 2020-04-13 DIAGNOSIS — M1990 Unspecified osteoarthritis, unspecified site: Secondary | ICD-10-CM | POA: Diagnosis not present

## 2020-04-13 DIAGNOSIS — E1159 Type 2 diabetes mellitus with other circulatory complications: Secondary | ICD-10-CM | POA: Diagnosis not present

## 2020-04-13 DIAGNOSIS — K5730 Diverticulosis of large intestine without perforation or abscess without bleeding: Secondary | ICD-10-CM | POA: Diagnosis not present

## 2020-04-13 DIAGNOSIS — I429 Cardiomyopathy, unspecified: Secondary | ICD-10-CM | POA: Diagnosis not present

## 2020-04-13 DIAGNOSIS — I5042 Chronic combined systolic (congestive) and diastolic (congestive) heart failure: Secondary | ICD-10-CM | POA: Diagnosis not present

## 2020-04-13 DIAGNOSIS — I110 Hypertensive heart disease with heart failure: Secondary | ICD-10-CM | POA: Diagnosis not present

## 2020-04-13 DIAGNOSIS — G253 Myoclonus: Secondary | ICD-10-CM | POA: Diagnosis not present

## 2020-04-13 LAB — BASIC METABOLIC PANEL
BUN: 22 mg/dL (ref 6–23)
CO2: 25 mEq/L (ref 19–32)
Calcium: 9.4 mg/dL (ref 8.4–10.5)
Chloride: 100 mEq/L (ref 96–112)
Creatinine, Ser: 1.21 mg/dL (ref 0.40–1.50)
GFR: 55.45 mL/min — ABNORMAL LOW (ref >60.00)
Glucose, Bld: 271 mg/dL — ABNORMAL HIGH (ref 70–99)
Potassium: 3.7 mEq/L (ref 3.5–5.1)
Sodium: 136 mEq/L (ref 135–145)

## 2020-04-13 MED ORDER — TAMSULOSIN HCL 0.4 MG PO CAPS: ORAL_CAPSULE | ORAL | Status: DC

## 2020-04-13 MED ORDER — AMLODIPINE BESYLATE 10 MG PO TABS: 5.0000 mg | ORAL_TABLET | Freq: Every morning | ORAL | Status: DC

## 2020-04-13 NOTE — Patient Instructions (Addendum)
Cut the amlodipine in half.  See if that helps with lightheadedness.   Go to the lab on the way out.   If you have mychart we'll likely use that to update you.    Take care.  Glad to see you.  If you keep having trouble with high sugars (around 200) then let me know.

## 2020-04-13 NOTE — Progress Notes (Signed)
This visit occurred during the SARS-CoV-2 public health emergency.  Safety protocols were in place, including screening questions prior to the visit, additional usage of staff PPE, and extensive cleaning of exam room while observing appropriate contact time as indicated for disinfecting solutions.  Inpatient f/u.  =================================  Discharge date: 04/06/2020  Admitted From: home Discharge disposition: home   Recommendations for Outpatient Follow-Up:   1. BMP 1 week 2. Statin continued, unclear if was taking at home 3. Home health 4. Wound care per GS:Ok to shower starting 2/25. Change dressing daily with xeroform, gauze, and tape or adhesive dressing. If xeroform or vaseline gauze is not available, apply antibiotic ointment/polysporin/bacitracin to raw areas of wound and non stick gauze under the plain gauze and tape.   No lifting or strenuous activity with left arm for 2 weeks.    Discharge Diagnosis:   Active Problems:   Essential hypertension   CAD (coronary artery disease)   BPH with obstruction/lower urinary tract symptoms   HLD (hyperlipidemia)   Type 2 diabetes mellitus with vascular disease (HCC)   Chest pain   Epigastric abdominal pain   Hematoma  Discharge Condition: Improved.  Diet recommendation: Low sodium, heart healthy.  Carbohydrate-modified.   Code status: DNR   History of Present Illness:   [name] a 86 y.o.malewith medical history significant ofh/o CAD with last myoview 2014 read as low risk, DM, HTN, who reports having chest pressure at 0800 hrs with associated nausea and a feeling of being light-headed. No diaphoresis, tachycardia. He has multiple risk factors including advanced age, DM, HTN, obesity. He presents to [facility] for evaluation.  ED Course:T 98.1 147/82 HR 91 RR 26. Covid NEGATIVE. Cmet with glucose 208, Cr 1.09, CBCD nl, EKG with NSR, PACs no STEMI, old anterior injury. CXR NAD. D-dimer 0.42. EDP  consulted with [name] for cardiology who recommended admission with cardiology to consult and determine appropriate risk stratification. [facility] called to admit patient.    Hospital Course by Problem:   Chest pain in a patient with history of CAD. He had non-ACS pattern troponin rise, currently on aspirin, Coreg and statin for secondary prevention. Now pain and symptom-free -cardiology has seen  Abdominal pain.  -resolved  Dyslipidemia.  -Continue statin  Hypertension.  -On Coreg and Norvasc   BPH.  -Continue Flomax.  Recent left shoulder melanoma surgery by [name], some pain and discomfort at the [place] surgery needs repeat evacuation on 2/23 -s/p surgery -duplex pending of upper extremity- chronic superficial thrombophlebitis  AKI.  -outpatient follow up  Hypomagnesemia.  -repleted  DM Type II -lantus- increased for hyperglycemia -sliding scale.  obesity Body mass index is 31.69 kg/m. ================================= He had prev melanoma excision L arm, then had local bleeding thereafter.  Readmitted with atypical chest pain.  Inpatient course discussed with patient as above  Recheck bmet pending given recent creatinine elevation.  He has gen surgery f/u pending regarding his arm.  Still with some oozing at the L arm site.    No CP now.  He has cardiology fu pending.  He couldn't tolerate atorvastatin from nausea.  Stopped med and nausea got better.  He is a little lightheaded, lower BP noted.    Sugar 112 this AM.  Sugar had been up to ~200 o/w at home.  We may need to adjust his Dm2 meds later.  Discussed with patient.  We did not want to make any changes at this point given his recent illness.  He wants to  be a DNR.  D/w pt.   Meds, vitals, and allergies reviewed.   ROS: Per HPI unless specifically indicated in ROS section   GEN: nad, alert and oriented HEENT: ncat NECK: supple w/o LA CV: rrr.  PULM: ctab, no inc wob ABD:  soft, +bs EXT: no edema SKIN: no acute rash, excision site on left arm is still sutured, minimal oozing.  No spreading erythema.  No purulent discharge.  Appears to be healing normally at this point.  Redressed. Left arm is still in sling for comfort.  37 minutes were devoted to patient care in this encounter (this includes time spent reviewing the patient's file/history, interviewing and examining the patient, counseling/reviewing plan with patient).

## 2020-04-14 NOTE — Assessment & Plan Note (Signed)
Status post resection. He has gen surgery f/u pending regarding his arm.  Still with some oozing at the L arm site but it appears to be healing normally otherwise.  Redressed at office visit.

## 2020-04-14 NOTE — Assessment & Plan Note (Signed)
Recheck bmet pending given recent creatinine elevation.  See notes on labs.

## 2020-04-14 NOTE — Assessment & Plan Note (Signed)
He did not tolerate atorvastatin due to nausea.  This improved off medication.  No other change in medications at this point.

## 2020-04-14 NOTE — Assessment & Plan Note (Signed)
Sugar 112 this AM.  Sugar had been up to ~200 o/w at home.  We may need to adjust his Dm2 meds later.  Discussed with patient.  We did not want to make any changes at this point given his recent illness.  He agrees to plan.

## 2020-04-14 NOTE — Assessment & Plan Note (Signed)
DNR reaffirmed by patient.

## 2020-04-16 DIAGNOSIS — C4362 Malignant melanoma of left upper limb, including shoulder: Secondary | ICD-10-CM | POA: Diagnosis not present

## 2020-04-16 DIAGNOSIS — G253 Myoclonus: Secondary | ICD-10-CM | POA: Diagnosis not present

## 2020-04-16 DIAGNOSIS — I429 Cardiomyopathy, unspecified: Secondary | ICD-10-CM | POA: Diagnosis not present

## 2020-04-16 DIAGNOSIS — I5042 Chronic combined systolic (congestive) and diastolic (congestive) heart failure: Secondary | ICD-10-CM | POA: Diagnosis not present

## 2020-04-16 DIAGNOSIS — I110 Hypertensive heart disease with heart failure: Secondary | ICD-10-CM | POA: Diagnosis not present

## 2020-04-16 DIAGNOSIS — L7631 Postprocedural hematoma of skin and subcutaneous tissue following a dermatologic procedure: Secondary | ICD-10-CM | POA: Diagnosis not present

## 2020-04-16 DIAGNOSIS — E119 Type 2 diabetes mellitus without complications: Secondary | ICD-10-CM | POA: Diagnosis not present

## 2020-04-16 DIAGNOSIS — N138 Other obstructive and reflux uropathy: Secondary | ICD-10-CM | POA: Diagnosis not present

## 2020-04-16 DIAGNOSIS — M1990 Unspecified osteoarthritis, unspecified site: Secondary | ICD-10-CM | POA: Diagnosis not present

## 2020-04-16 DIAGNOSIS — I2510 Atherosclerotic heart disease of native coronary artery without angina pectoris: Secondary | ICD-10-CM | POA: Diagnosis not present

## 2020-04-16 DIAGNOSIS — K5730 Diverticulosis of large intestine without perforation or abscess without bleeding: Secondary | ICD-10-CM | POA: Diagnosis not present

## 2020-04-18 DIAGNOSIS — M1990 Unspecified osteoarthritis, unspecified site: Secondary | ICD-10-CM | POA: Diagnosis not present

## 2020-04-18 DIAGNOSIS — I5042 Chronic combined systolic (congestive) and diastolic (congestive) heart failure: Secondary | ICD-10-CM | POA: Diagnosis not present

## 2020-04-18 DIAGNOSIS — L7631 Postprocedural hematoma of skin and subcutaneous tissue following a dermatologic procedure: Secondary | ICD-10-CM | POA: Diagnosis not present

## 2020-04-18 DIAGNOSIS — E119 Type 2 diabetes mellitus without complications: Secondary | ICD-10-CM | POA: Diagnosis not present

## 2020-04-18 DIAGNOSIS — I429 Cardiomyopathy, unspecified: Secondary | ICD-10-CM | POA: Diagnosis not present

## 2020-04-18 DIAGNOSIS — I2510 Atherosclerotic heart disease of native coronary artery without angina pectoris: Secondary | ICD-10-CM | POA: Diagnosis not present

## 2020-04-18 DIAGNOSIS — N138 Other obstructive and reflux uropathy: Secondary | ICD-10-CM | POA: Diagnosis not present

## 2020-04-18 DIAGNOSIS — K5730 Diverticulosis of large intestine without perforation or abscess without bleeding: Secondary | ICD-10-CM | POA: Diagnosis not present

## 2020-04-18 DIAGNOSIS — C4362 Malignant melanoma of left upper limb, including shoulder: Secondary | ICD-10-CM | POA: Diagnosis not present

## 2020-04-18 DIAGNOSIS — G253 Myoclonus: Secondary | ICD-10-CM | POA: Diagnosis not present

## 2020-04-18 DIAGNOSIS — I110 Hypertensive heart disease with heart failure: Secondary | ICD-10-CM | POA: Diagnosis not present

## 2020-04-19 ENCOUNTER — Telehealth: Payer: Self-pay

## 2020-04-19 DIAGNOSIS — C4362 Malignant melanoma of left upper limb, including shoulder: Secondary | ICD-10-CM | POA: Diagnosis not present

## 2020-04-19 DIAGNOSIS — I429 Cardiomyopathy, unspecified: Secondary | ICD-10-CM | POA: Diagnosis not present

## 2020-04-19 DIAGNOSIS — K5730 Diverticulosis of large intestine without perforation or abscess without bleeding: Secondary | ICD-10-CM | POA: Diagnosis not present

## 2020-04-19 DIAGNOSIS — I2510 Atherosclerotic heart disease of native coronary artery without angina pectoris: Secondary | ICD-10-CM | POA: Diagnosis not present

## 2020-04-19 DIAGNOSIS — I110 Hypertensive heart disease with heart failure: Secondary | ICD-10-CM | POA: Diagnosis not present

## 2020-04-19 DIAGNOSIS — I5042 Chronic combined systolic (congestive) and diastolic (congestive) heart failure: Secondary | ICD-10-CM | POA: Diagnosis not present

## 2020-04-19 DIAGNOSIS — L7631 Postprocedural hematoma of skin and subcutaneous tissue following a dermatologic procedure: Secondary | ICD-10-CM | POA: Diagnosis not present

## 2020-04-19 DIAGNOSIS — E119 Type 2 diabetes mellitus without complications: Secondary | ICD-10-CM | POA: Diagnosis not present

## 2020-04-19 DIAGNOSIS — G253 Myoclonus: Secondary | ICD-10-CM | POA: Diagnosis not present

## 2020-04-19 DIAGNOSIS — M1990 Unspecified osteoarthritis, unspecified site: Secondary | ICD-10-CM | POA: Diagnosis not present

## 2020-04-19 DIAGNOSIS — N138 Other obstructive and reflux uropathy: Secondary | ICD-10-CM | POA: Diagnosis not present

## 2020-04-19 NOTE — Telephone Encounter (Signed)
Judson Roch, RN with [facility] visited with pt today. Had stitches removed 04/17/20. Asking if orders of vasoline gauze is still active or soap and water and keep clean should be the new orders for wound care. Please advise at [phone]

## 2020-04-22 NOTE — Telephone Encounter (Signed)
I would gently clean it with soap and water daily and cover if needed.  Thanks.

## 2020-04-23 DIAGNOSIS — K5730 Diverticulosis of large intestine without perforation or abscess without bleeding: Secondary | ICD-10-CM | POA: Diagnosis not present

## 2020-04-23 DIAGNOSIS — M1990 Unspecified osteoarthritis, unspecified site: Secondary | ICD-10-CM | POA: Diagnosis not present

## 2020-04-23 DIAGNOSIS — I5042 Chronic combined systolic (congestive) and diastolic (congestive) heart failure: Secondary | ICD-10-CM | POA: Diagnosis not present

## 2020-04-23 DIAGNOSIS — I429 Cardiomyopathy, unspecified: Secondary | ICD-10-CM | POA: Diagnosis not present

## 2020-04-23 DIAGNOSIS — N138 Other obstructive and reflux uropathy: Secondary | ICD-10-CM | POA: Diagnosis not present

## 2020-04-23 DIAGNOSIS — C4362 Malignant melanoma of left upper limb, including shoulder: Secondary | ICD-10-CM | POA: Diagnosis not present

## 2020-04-23 DIAGNOSIS — E119 Type 2 diabetes mellitus without complications: Secondary | ICD-10-CM | POA: Diagnosis not present

## 2020-04-23 DIAGNOSIS — G253 Myoclonus: Secondary | ICD-10-CM | POA: Diagnosis not present

## 2020-04-23 DIAGNOSIS — L7631 Postprocedural hematoma of skin and subcutaneous tissue following a dermatologic procedure: Secondary | ICD-10-CM | POA: Diagnosis not present

## 2020-04-23 DIAGNOSIS — I110 Hypertensive heart disease with heart failure: Secondary | ICD-10-CM | POA: Diagnosis not present

## 2020-04-23 DIAGNOSIS — I2510 Atherosclerotic heart disease of native coronary artery without angina pectoris: Secondary | ICD-10-CM | POA: Diagnosis not present

## 2020-04-24 DIAGNOSIS — I429 Cardiomyopathy, unspecified: Secondary | ICD-10-CM | POA: Diagnosis not present

## 2020-04-24 DIAGNOSIS — M1990 Unspecified osteoarthritis, unspecified site: Secondary | ICD-10-CM | POA: Diagnosis not present

## 2020-04-24 DIAGNOSIS — I110 Hypertensive heart disease with heart failure: Secondary | ICD-10-CM | POA: Diagnosis not present

## 2020-04-24 DIAGNOSIS — C4362 Malignant melanoma of left upper limb, including shoulder: Secondary | ICD-10-CM | POA: Diagnosis not present

## 2020-04-24 DIAGNOSIS — I5042 Chronic combined systolic (congestive) and diastolic (congestive) heart failure: Secondary | ICD-10-CM | POA: Diagnosis not present

## 2020-04-24 DIAGNOSIS — G253 Myoclonus: Secondary | ICD-10-CM | POA: Diagnosis not present

## 2020-04-24 DIAGNOSIS — N138 Other obstructive and reflux uropathy: Secondary | ICD-10-CM | POA: Diagnosis not present

## 2020-04-24 DIAGNOSIS — I2510 Atherosclerotic heart disease of native coronary artery without angina pectoris: Secondary | ICD-10-CM | POA: Diagnosis not present

## 2020-04-24 DIAGNOSIS — E119 Type 2 diabetes mellitus without complications: Secondary | ICD-10-CM | POA: Diagnosis not present

## 2020-04-24 DIAGNOSIS — K5730 Diverticulosis of large intestine without perforation or abscess without bleeding: Secondary | ICD-10-CM | POA: Diagnosis not present

## 2020-04-24 DIAGNOSIS — L7631 Postprocedural hematoma of skin and subcutaneous tissue following a dermatologic procedure: Secondary | ICD-10-CM | POA: Diagnosis not present

## 2020-04-24 NOTE — Telephone Encounter (Signed)
Left message on secure VM for [name] about wound care. Advised can call back if needed.

## 2020-04-26 ENCOUNTER — Telehealth: Payer: Self-pay

## 2020-04-26 DIAGNOSIS — I5042 Chronic combined systolic (congestive) and diastolic (congestive) heart failure: Secondary | ICD-10-CM | POA: Diagnosis not present

## 2020-04-26 DIAGNOSIS — I429 Cardiomyopathy, unspecified: Secondary | ICD-10-CM | POA: Diagnosis not present

## 2020-04-26 DIAGNOSIS — E119 Type 2 diabetes mellitus without complications: Secondary | ICD-10-CM | POA: Diagnosis not present

## 2020-04-26 DIAGNOSIS — N138 Other obstructive and reflux uropathy: Secondary | ICD-10-CM | POA: Diagnosis not present

## 2020-04-26 DIAGNOSIS — I110 Hypertensive heart disease with heart failure: Secondary | ICD-10-CM | POA: Diagnosis not present

## 2020-04-26 DIAGNOSIS — I2510 Atherosclerotic heart disease of native coronary artery without angina pectoris: Secondary | ICD-10-CM | POA: Diagnosis not present

## 2020-04-26 DIAGNOSIS — G253 Myoclonus: Secondary | ICD-10-CM | POA: Diagnosis not present

## 2020-04-26 DIAGNOSIS — C4362 Malignant melanoma of left upper limb, including shoulder: Secondary | ICD-10-CM | POA: Diagnosis not present

## 2020-04-26 DIAGNOSIS — K5730 Diverticulosis of large intestine without perforation or abscess without bleeding: Secondary | ICD-10-CM | POA: Diagnosis not present

## 2020-04-26 DIAGNOSIS — L7631 Postprocedural hematoma of skin and subcutaneous tissue following a dermatologic procedure: Secondary | ICD-10-CM | POA: Diagnosis not present

## 2020-04-26 DIAGNOSIS — M1990 Unspecified osteoarthritis, unspecified site: Secondary | ICD-10-CM | POA: Diagnosis not present

## 2020-04-26 NOTE — Telephone Encounter (Signed)
Left message for sarah to clarify if orders need to be verbal or written with new instructions?

## 2020-04-26 NOTE — Telephone Encounter (Signed)
Vm from [facility] stating she did PRN visit with pt today due to pt calling with concerns.  Says pt has an opening in the wound about 1.5 cm long, 0.5 cm wide and about 0.5 cm deep.   Judson Roch states a change in wound care orders is needed:  -Pack with calcium alginate -Cleanse with normal saline or soap & water -Pack with calcium alginate -Cover with dry a dressing and change every 3 days or PRN for     soilage  Judson Roch can be reached at [phone].

## 2020-04-27 ENCOUNTER — Telehealth: Payer: Self-pay

## 2020-04-27 NOTE — Telephone Encounter (Signed)
Judson Roch has returned the call and she said the orders can be verbal.

## 2020-04-27 NOTE — Telephone Encounter (Signed)
Spoke with [facility] and gave verbal orders

## 2020-04-27 NOTE — Telephone Encounter (Signed)
Home health called back and were given verbal orders. See other TE note: Also pt had some drainage from incision with small open area and [facility] is going out to access incision and drainage.

## 2020-04-27 NOTE — Telephone Encounter (Signed)
Please give the order for PT and for eval and tx from the incision.  Thanks.

## 2020-04-27 NOTE — Telephone Encounter (Signed)
PT with [facility] left v/m requesting verbal orders for [facility] PT 2 x a wk for 2 wks and 1 x a wk for 2wks. Also pt had some drainage from incision with small open area and [facility] is going out to access incision and drainage.sending note to Dr Damita Dunnings.

## 2020-04-29 DIAGNOSIS — I110 Hypertensive heart disease with heart failure: Secondary | ICD-10-CM | POA: Diagnosis not present

## 2020-04-29 DIAGNOSIS — Z794 Long term (current) use of insulin: Secondary | ICD-10-CM

## 2020-04-29 DIAGNOSIS — Z86010 Personal history of colonic polyps: Secondary | ICD-10-CM

## 2020-04-29 DIAGNOSIS — K5730 Diverticulosis of large intestine without perforation or abscess without bleeding: Secondary | ICD-10-CM | POA: Diagnosis not present

## 2020-04-29 DIAGNOSIS — E119 Type 2 diabetes mellitus without complications: Secondary | ICD-10-CM | POA: Diagnosis not present

## 2020-04-29 DIAGNOSIS — Z7984 Long term (current) use of oral hypoglycemic drugs: Secondary | ICD-10-CM

## 2020-04-29 DIAGNOSIS — E785 Hyperlipidemia, unspecified: Secondary | ICD-10-CM

## 2020-04-29 DIAGNOSIS — Z6831 Body mass index (BMI) 31.0-31.9, adult: Secondary | ICD-10-CM

## 2020-04-29 DIAGNOSIS — N138 Other obstructive and reflux uropathy: Secondary | ICD-10-CM | POA: Diagnosis not present

## 2020-04-29 DIAGNOSIS — C4362 Malignant melanoma of left upper limb, including shoulder: Secondary | ICD-10-CM | POA: Diagnosis not present

## 2020-04-29 DIAGNOSIS — E669 Obesity, unspecified: Secondary | ICD-10-CM

## 2020-04-29 DIAGNOSIS — I5042 Chronic combined systolic (congestive) and diastolic (congestive) heart failure: Secondary | ICD-10-CM | POA: Diagnosis not present

## 2020-04-29 DIAGNOSIS — L7631 Postprocedural hematoma of skin and subcutaneous tissue following a dermatologic procedure: Secondary | ICD-10-CM | POA: Diagnosis not present

## 2020-04-29 DIAGNOSIS — I2510 Atherosclerotic heart disease of native coronary artery without angina pectoris: Secondary | ICD-10-CM | POA: Diagnosis not present

## 2020-04-29 DIAGNOSIS — G253 Myoclonus: Secondary | ICD-10-CM | POA: Diagnosis not present

## 2020-04-29 DIAGNOSIS — Z79891 Long term (current) use of opiate analgesic: Secondary | ICD-10-CM

## 2020-04-29 DIAGNOSIS — F419 Anxiety disorder, unspecified: Secondary | ICD-10-CM

## 2020-04-29 DIAGNOSIS — M1990 Unspecified osteoarthritis, unspecified site: Secondary | ICD-10-CM

## 2020-04-29 DIAGNOSIS — Z9181 History of falling: Secondary | ICD-10-CM

## 2020-04-29 DIAGNOSIS — I429 Cardiomyopathy, unspecified: Secondary | ICD-10-CM | POA: Diagnosis not present

## 2020-04-29 DIAGNOSIS — Z7982 Long term (current) use of aspirin: Secondary | ICD-10-CM

## 2020-04-29 DIAGNOSIS — N401 Enlarged prostate with lower urinary tract symptoms: Secondary | ICD-10-CM

## 2020-05-01 DIAGNOSIS — M1990 Unspecified osteoarthritis, unspecified site: Secondary | ICD-10-CM | POA: Diagnosis not present

## 2020-05-01 DIAGNOSIS — I429 Cardiomyopathy, unspecified: Secondary | ICD-10-CM | POA: Diagnosis not present

## 2020-05-01 DIAGNOSIS — I5042 Chronic combined systolic (congestive) and diastolic (congestive) heart failure: Secondary | ICD-10-CM | POA: Diagnosis not present

## 2020-05-01 DIAGNOSIS — I110 Hypertensive heart disease with heart failure: Secondary | ICD-10-CM | POA: Diagnosis not present

## 2020-05-01 DIAGNOSIS — C4362 Malignant melanoma of left upper limb, including shoulder: Secondary | ICD-10-CM | POA: Diagnosis not present

## 2020-05-01 DIAGNOSIS — K5730 Diverticulosis of large intestine without perforation or abscess without bleeding: Secondary | ICD-10-CM | POA: Diagnosis not present

## 2020-05-01 DIAGNOSIS — E119 Type 2 diabetes mellitus without complications: Secondary | ICD-10-CM | POA: Diagnosis not present

## 2020-05-01 DIAGNOSIS — N138 Other obstructive and reflux uropathy: Secondary | ICD-10-CM | POA: Diagnosis not present

## 2020-05-01 DIAGNOSIS — G253 Myoclonus: Secondary | ICD-10-CM | POA: Diagnosis not present

## 2020-05-01 DIAGNOSIS — I2510 Atherosclerotic heart disease of native coronary artery without angina pectoris: Secondary | ICD-10-CM | POA: Diagnosis not present

## 2020-05-01 DIAGNOSIS — L7631 Postprocedural hematoma of skin and subcutaneous tissue following a dermatologic procedure: Secondary | ICD-10-CM | POA: Diagnosis not present

## 2020-05-02 DIAGNOSIS — L7631 Postprocedural hematoma of skin and subcutaneous tissue following a dermatologic procedure: Secondary | ICD-10-CM | POA: Diagnosis not present

## 2020-05-07 ENCOUNTER — Ambulatory Visit: Payer: Medicare Other | Admitting: Physician Assistant

## 2020-05-08 DIAGNOSIS — G253 Myoclonus: Secondary | ICD-10-CM | POA: Diagnosis not present

## 2020-05-08 DIAGNOSIS — E119 Type 2 diabetes mellitus without complications: Secondary | ICD-10-CM | POA: Diagnosis not present

## 2020-05-08 DIAGNOSIS — C4362 Malignant melanoma of left upper limb, including shoulder: Secondary | ICD-10-CM | POA: Diagnosis not present

## 2020-05-08 DIAGNOSIS — I2510 Atherosclerotic heart disease of native coronary artery without angina pectoris: Secondary | ICD-10-CM | POA: Diagnosis not present

## 2020-05-08 DIAGNOSIS — I429 Cardiomyopathy, unspecified: Secondary | ICD-10-CM | POA: Diagnosis not present

## 2020-05-08 DIAGNOSIS — N138 Other obstructive and reflux uropathy: Secondary | ICD-10-CM | POA: Diagnosis not present

## 2020-05-08 DIAGNOSIS — I5042 Chronic combined systolic (congestive) and diastolic (congestive) heart failure: Secondary | ICD-10-CM | POA: Diagnosis not present

## 2020-05-08 DIAGNOSIS — M1990 Unspecified osteoarthritis, unspecified site: Secondary | ICD-10-CM | POA: Diagnosis not present

## 2020-05-08 DIAGNOSIS — I110 Hypertensive heart disease with heart failure: Secondary | ICD-10-CM | POA: Diagnosis not present

## 2020-05-08 DIAGNOSIS — L7631 Postprocedural hematoma of skin and subcutaneous tissue following a dermatologic procedure: Secondary | ICD-10-CM | POA: Diagnosis not present

## 2020-05-08 DIAGNOSIS — K5730 Diverticulosis of large intestine without perforation or abscess without bleeding: Secondary | ICD-10-CM | POA: Diagnosis not present

## 2020-05-10 DIAGNOSIS — I5042 Chronic combined systolic (congestive) and diastolic (congestive) heart failure: Secondary | ICD-10-CM | POA: Diagnosis not present

## 2020-05-10 DIAGNOSIS — M1990 Unspecified osteoarthritis, unspecified site: Secondary | ICD-10-CM | POA: Diagnosis not present

## 2020-05-10 DIAGNOSIS — C4362 Malignant melanoma of left upper limb, including shoulder: Secondary | ICD-10-CM | POA: Diagnosis not present

## 2020-05-10 DIAGNOSIS — K5730 Diverticulosis of large intestine without perforation or abscess without bleeding: Secondary | ICD-10-CM | POA: Diagnosis not present

## 2020-05-10 DIAGNOSIS — L7631 Postprocedural hematoma of skin and subcutaneous tissue following a dermatologic procedure: Secondary | ICD-10-CM | POA: Diagnosis not present

## 2020-05-10 DIAGNOSIS — N138 Other obstructive and reflux uropathy: Secondary | ICD-10-CM | POA: Diagnosis not present

## 2020-05-10 DIAGNOSIS — I110 Hypertensive heart disease with heart failure: Secondary | ICD-10-CM | POA: Diagnosis not present

## 2020-05-10 DIAGNOSIS — G253 Myoclonus: Secondary | ICD-10-CM | POA: Diagnosis not present

## 2020-05-10 DIAGNOSIS — E119 Type 2 diabetes mellitus without complications: Secondary | ICD-10-CM | POA: Diagnosis not present

## 2020-05-10 DIAGNOSIS — I2510 Atherosclerotic heart disease of native coronary artery without angina pectoris: Secondary | ICD-10-CM | POA: Diagnosis not present

## 2020-05-10 DIAGNOSIS — I429 Cardiomyopathy, unspecified: Secondary | ICD-10-CM | POA: Diagnosis not present

## 2020-05-11 DIAGNOSIS — I2510 Atherosclerotic heart disease of native coronary artery without angina pectoris: Secondary | ICD-10-CM | POA: Diagnosis not present

## 2020-05-11 DIAGNOSIS — G253 Myoclonus: Secondary | ICD-10-CM | POA: Diagnosis not present

## 2020-05-11 DIAGNOSIS — M1990 Unspecified osteoarthritis, unspecified site: Secondary | ICD-10-CM | POA: Diagnosis not present

## 2020-05-11 DIAGNOSIS — L7631 Postprocedural hematoma of skin and subcutaneous tissue following a dermatologic procedure: Secondary | ICD-10-CM | POA: Diagnosis not present

## 2020-05-11 DIAGNOSIS — I110 Hypertensive heart disease with heart failure: Secondary | ICD-10-CM | POA: Diagnosis not present

## 2020-05-11 DIAGNOSIS — K5730 Diverticulosis of large intestine without perforation or abscess without bleeding: Secondary | ICD-10-CM | POA: Diagnosis not present

## 2020-05-11 DIAGNOSIS — I429 Cardiomyopathy, unspecified: Secondary | ICD-10-CM | POA: Diagnosis not present

## 2020-05-11 DIAGNOSIS — E119 Type 2 diabetes mellitus without complications: Secondary | ICD-10-CM | POA: Diagnosis not present

## 2020-05-11 DIAGNOSIS — I5042 Chronic combined systolic (congestive) and diastolic (congestive) heart failure: Secondary | ICD-10-CM | POA: Diagnosis not present

## 2020-05-11 DIAGNOSIS — N138 Other obstructive and reflux uropathy: Secondary | ICD-10-CM | POA: Diagnosis not present

## 2020-05-11 DIAGNOSIS — C4362 Malignant melanoma of left upper limb, including shoulder: Secondary | ICD-10-CM | POA: Diagnosis not present

## 2020-05-16 DIAGNOSIS — K5730 Diverticulosis of large intestine without perforation or abscess without bleeding: Secondary | ICD-10-CM | POA: Diagnosis not present

## 2020-05-16 DIAGNOSIS — N138 Other obstructive and reflux uropathy: Secondary | ICD-10-CM | POA: Diagnosis not present

## 2020-05-16 DIAGNOSIS — I429 Cardiomyopathy, unspecified: Secondary | ICD-10-CM | POA: Diagnosis not present

## 2020-05-16 DIAGNOSIS — L7631 Postprocedural hematoma of skin and subcutaneous tissue following a dermatologic procedure: Secondary | ICD-10-CM | POA: Diagnosis not present

## 2020-05-16 DIAGNOSIS — I110 Hypertensive heart disease with heart failure: Secondary | ICD-10-CM | POA: Diagnosis not present

## 2020-05-16 DIAGNOSIS — E119 Type 2 diabetes mellitus without complications: Secondary | ICD-10-CM | POA: Diagnosis not present

## 2020-05-16 DIAGNOSIS — G253 Myoclonus: Secondary | ICD-10-CM | POA: Diagnosis not present

## 2020-05-16 DIAGNOSIS — M1990 Unspecified osteoarthritis, unspecified site: Secondary | ICD-10-CM | POA: Diagnosis not present

## 2020-05-16 DIAGNOSIS — I2510 Atherosclerotic heart disease of native coronary artery without angina pectoris: Secondary | ICD-10-CM | POA: Diagnosis not present

## 2020-05-16 DIAGNOSIS — I5042 Chronic combined systolic (congestive) and diastolic (congestive) heart failure: Secondary | ICD-10-CM | POA: Diagnosis not present

## 2020-05-16 DIAGNOSIS — C4362 Malignant melanoma of left upper limb, including shoulder: Secondary | ICD-10-CM | POA: Diagnosis not present

## 2020-05-23 DIAGNOSIS — N138 Other obstructive and reflux uropathy: Secondary | ICD-10-CM | POA: Diagnosis not present

## 2020-05-23 DIAGNOSIS — I429 Cardiomyopathy, unspecified: Secondary | ICD-10-CM | POA: Diagnosis not present

## 2020-05-23 DIAGNOSIS — I5042 Chronic combined systolic (congestive) and diastolic (congestive) heart failure: Secondary | ICD-10-CM | POA: Diagnosis not present

## 2020-05-23 DIAGNOSIS — E119 Type 2 diabetes mellitus without complications: Secondary | ICD-10-CM | POA: Diagnosis not present

## 2020-05-23 DIAGNOSIS — G253 Myoclonus: Secondary | ICD-10-CM | POA: Diagnosis not present

## 2020-05-23 DIAGNOSIS — K5730 Diverticulosis of large intestine without perforation or abscess without bleeding: Secondary | ICD-10-CM | POA: Diagnosis not present

## 2020-05-23 DIAGNOSIS — I110 Hypertensive heart disease with heart failure: Secondary | ICD-10-CM | POA: Diagnosis not present

## 2020-05-23 DIAGNOSIS — L7631 Postprocedural hematoma of skin and subcutaneous tissue following a dermatologic procedure: Secondary | ICD-10-CM | POA: Diagnosis not present

## 2020-05-23 DIAGNOSIS — M1990 Unspecified osteoarthritis, unspecified site: Secondary | ICD-10-CM | POA: Diagnosis not present

## 2020-05-23 DIAGNOSIS — C4362 Malignant melanoma of left upper limb, including shoulder: Secondary | ICD-10-CM | POA: Diagnosis not present

## 2020-05-23 DIAGNOSIS — I2510 Atherosclerotic heart disease of native coronary artery without angina pectoris: Secondary | ICD-10-CM | POA: Diagnosis not present

## 2020-06-01 ENCOUNTER — Telehealth: Payer: Self-pay

## 2020-06-01 NOTE — Chronic Care Management (AMB) (Addendum)
    Chronic Care Management Pharmacy Assistant   Name: TALLY MCKINNON  MRN: [id number] DOB: 1935-01-29  Reason for Encounter: Reschedule missed CCM appointment  Medications: Outpatient Encounter Medications as of 06/01/2020  Medication Sig   Accu-Chek Softclix Lancets lancets TEST BLOOD SUGAR UP TO THREE TIMES DAILY   amLODipine (NORVASC) 10 MG tablet Take 0.5 tablets (5 mg total) by mouth every morning.   aspirin EC 81 MG tablet Take 81 mg by mouth every evening.   Blood Glucose Calibration (ACCU-CHEK AVIVA) SOLN Use as directed monthly. E11.59  Insulin dependent (Patient taking differently: Use as directed monthly. E11.59  Insulin dependent)   Blood Glucose Monitoring Suppl (ACCU-CHEK AVIVA PLUS) w/Device KIT Check sugar up to 3 times daily. E11.59  Insulin dependent   carvedilol (COREG) 12.5 MG tablet TAKE 1 TABLET TWICE DAILY WITH MEALS   COD LIVER OIL PO Take 1 capsule by mouth 2 (two) times daily.    cyanocobalamin (,VITAMIN B-12,) 1000 MCG/ML injection Inject 1 mL (1,000 mcg total) into the muscle every 30 (thirty) days. Dispense 3 of the 1 mL vials.   DROPLET PEN NEEDLES 30G X 8 MM MISC USE  WITH  INSULIN  PEN   glimepiride (AMARYL) 4 MG tablet Take 1/2 tablet every day with breakfast   glucose blood (ONETOUCH ULTRA) test strip Use as instructed   hydrALAZINE (APRESOLINE) 50 MG tablet Take 1 tablet (50 mg total) by mouth 3 (three) times daily.   insulin glargine, 1 Unit Dial, ([name] SOLOSTAR) 300 UNIT/ML Solostar Pen Inject 4-8 Units into the skin at bedtime.   insulin lispro (HUMALOG KWIKPEN) 100 UNIT/ML KwikPen Inject 0.04-0.1 mLs (4-10 Units total) into the skin 3 (three) times daily. INJECT INTO SKIN 3 TIMES DAILY BEFORE MEALS USING SLIDING SCALE. 151 TO 200 = 4 UNITS, 201 TO 250= 6 UNITS, 251 TO 300= 8 UNITS, > 300 = 10 units   lisinopril-hydrochlorothiazide (ZESTORETIC) 20-25 MG tablet Take 1 tablet by mouth every morning.   potassium chloride SA (KLOR-CON M20) 20 MEQ tablet  Take 1 tablet (20 mEq total) by mouth daily.   SYRINGE-NEEDLE, DISP, 3 ML (B-D 3CC LUER-LOK SYR 25GX1") 25G X 1" 3 ML MISC USE TO INJECT VITAMIN B12 MONTHLY.   tamsulosin (FLOMAX) 0.4 MG CAPS capsule TAKE 1 CAPSULE EVERY DAY  AFTER  SUPPER   No facility-administered encounter medications on file as of 06/01/2020.   Contacted Mr. Guillet 06/01/20, 06/04/20, 06/06/20 to reschedule his missed initial appointment with CCM from December 2021. Left voice message for patient to return call.    Follow-Up:  Pharmacist Review  Debbora Dus, CPP notified  Margaretmary Dys, [place] Assistant [phone]  I have reviewed the care management and care coordination activities outlined in this encounter and I am certifying that I agree with the content of this note. No further action required.  Debbora Dus, PharmD Clinical Pharmacist [facility] [phone]

## 2020-07-03 ENCOUNTER — Ambulatory Visit (INDEPENDENT_AMBULATORY_CARE_PROVIDER_SITE_OTHER): Payer: Medicare Other | Admitting: Family Medicine

## 2020-07-03 ENCOUNTER — Encounter: Payer: Self-pay | Admitting: Family Medicine

## 2020-07-03 ENCOUNTER — Other Ambulatory Visit: Payer: Self-pay

## 2020-07-03 VITALS — BP 150/70 | HR 83 | Temp 98.3°F | Ht 72.0 in | Wt 227.0 lb

## 2020-07-03 DIAGNOSIS — E1159 Type 2 diabetes mellitus with other circulatory complications: Secondary | ICD-10-CM | POA: Diagnosis not present

## 2020-07-03 LAB — POCT GLYCOSYLATED HEMOGLOBIN (HGB A1C): Hemoglobin A1C: 8.9 % — AB (ref 4.0–5.6)

## 2020-07-03 MED ORDER — TOUJEO SOLOSTAR 300 UNIT/ML ~~LOC~~ SOPN: 12.0000 [IU] | PEN_INJECTOR | Freq: Every evening | SUBCUTANEOUS | Status: DC

## 2020-07-03 NOTE — Progress Notes (Signed)
This visit occurred during the SARS-CoV-2 public health emergency.  Safety protocols were in place, including screening questions prior to the visit, additional usage of staff PPE, and extensive cleaning of exam room while observing appropriate contact time as indicated for disinfecting solutions.  Diabetes:  Using medications without difficulties: yes, basal with SSI insulin.  Taking glimepiride 1/2 tab a day.   Hypoglycemic episodes:no Hyperglycemic episodes:no Feet problems:no Blood Sugars averaging: ~160s in the AM, rarely down to 94 w/o troubles eye exam within last year: he had to get rescheduled, has f/u pending for tomorrow.   He is usually limiting carbs. Goal A1c ~8 with goal to limit low sugars. D/w pt.    Recheck BP 150/70.  Usually SBP 130-140 at home.    He is walking with a cane but hasn't fallen.  D/w pt.    Meds, vitals, and allergies reviewed.   ROS: Per HPI unless specifically indicated in ROS section   GEN: nad, alert and oriented HEENT: ncat NECK: supple w/o LA CV: rrr. PULM: ctab, no inc wob ABD: soft, +bs EXT: no edema SKIN: no acute rash  Diabetic foot exam: Normal inspection No skin breakdown No calluses  1+ DP pulses Normal sensation to light touch and monofilament Nails thickened.

## 2020-07-03 NOTE — Patient Instructions (Signed)
Goal A1c around 8.  I wouldn't change your meds at this point.   Plan on recheck in about 6 months at a yearly visit.  Update me in the meantime if needed, if your sugar is persistently higher or lower.   Take care.  Glad to see you.

## 2020-07-04 DIAGNOSIS — H26491 Other secondary cataract, right eye: Secondary | ICD-10-CM | POA: Diagnosis not present

## 2020-07-04 DIAGNOSIS — H25812 Combined forms of age-related cataract, left eye: Secondary | ICD-10-CM | POA: Diagnosis not present

## 2020-07-04 DIAGNOSIS — H40013 Open angle with borderline findings, low risk, bilateral: Secondary | ICD-10-CM | POA: Diagnosis not present

## 2020-07-04 DIAGNOSIS — E119 Type 2 diabetes mellitus without complications: Secondary | ICD-10-CM | POA: Diagnosis not present

## 2020-07-04 DIAGNOSIS — Z961 Presence of intraocular lens: Secondary | ICD-10-CM | POA: Diagnosis not present

## 2020-07-04 LAB — HM DIABETES EYE EXAM

## 2020-07-04 NOTE — Assessment & Plan Note (Signed)
Goal A1c ~8 with goal to limit low sugars. D/w pt.   Recheck BP 150/70.  Usually SBP 130-140 at home.   Given his goal A1c and his blood pressure readings at home, I think it makes sense not to change his medications.  Continue basal plus sliding scale insulin.  See after visit summary.  Recheck periodically.  He agrees.  Continue work on diet and exercise as allowed.

## 2020-07-18 ENCOUNTER — Ambulatory Visit (INDEPENDENT_AMBULATORY_CARE_PROVIDER_SITE_OTHER): Payer: Medicare Other

## 2020-07-18 ENCOUNTER — Other Ambulatory Visit: Payer: Self-pay

## 2020-07-18 DIAGNOSIS — Z0000 Encounter for general adult medical examination without abnormal findings: Secondary | ICD-10-CM | POA: Diagnosis not present

## 2020-07-18 NOTE — Patient Instructions (Signed)
[name] , Thank you for taking time to come for your Medicare Wellness Visit. I appreciate your ongoing commitment to your health goals. Please review the following plan we discussed and let me know if I can assist you in the future.   Screening recommendations/referrals: Colonoscopy: no longer required  Recommended yearly ophthalmology/optometry visit for glaucoma screening and checkup Recommended yearly dental visit for hygiene and checkup  Vaccinations: Influenza vaccine: Up to date, completed 01/03/2020, due 09/2020 Pneumococcal vaccine: Completed series Tdap vaccine: Up to date, completed 08/15/2014, due 08/2024 Shingles vaccine: due, check with your insurance regarding coverage if interested    Covid-19: Completed series  Advanced directives: Please bring a copy of your POA (Power of [place]) and/or Living Will to your next appointment.   Conditions/risks identified: diabetes, hypertension, hyperlipidemia   Next appointment: Follow up in one year for your annual wellness visit.   Preventive Care 4 Years and Older, Male Preventive care refers to lifestyle choices and visits with your health care provider that can promote health and wellness. What does preventive care include?  A yearly physical exam. This is also called an annual well check.  Dental exams once or twice a year.  Routine eye exams. Ask your health care provider how often you should have your eyes checked.  Personal lifestyle choices, including:  Daily care of your teeth and gums.  Regular physical activity.  Eating a healthy diet.  Avoiding tobacco and drug use.  Limiting alcohol use.  Practicing safe sex.  Taking low doses of aspirin every day.  Taking vitamin and mineral supplements as recommended by your health care provider. What happens during an annual well check? The services and screenings done by your health care provider during your annual well check will depend on your age, overall  health, lifestyle risk factors, and family history of disease. Counseling  Your health care provider may ask you questions about your:  Alcohol use.  Tobacco use.  Drug use.  Emotional well-being.  Home and relationship well-being.  Sexual activity.  Eating habits.  History of falls.  Memory and ability to understand (cognition).  Work and work Statistician. Screening  You may have the following tests or measurements:  Height, weight, and BMI.  Blood pressure.  Lipid and cholesterol levels. These may be checked every 5 years, or more frequently if you are over 60 years old.  Skin check.  Lung cancer screening. You may have this screening every year starting at age 87 if you have a 30-pack-year history of smoking and currently smoke or have quit within the past 15 years.  Fecal occult blood test (FOBT) of the stool. You may have this test every year starting at age 89.  Flexible sigmoidoscopy or colonoscopy. You may have a sigmoidoscopy every 5 years or a colonoscopy every 10 years starting at age 77.  Prostate cancer screening. Recommendations will vary depending on your family history and other risks.  Hepatitis C blood test.  Hepatitis B blood test.  Sexually transmitted disease (STD) testing.  Diabetes screening. This is done by checking your blood sugar (glucose) after you have not eaten for a while (fasting). You may have this done every 1-3 years.  Abdominal aortic aneurysm (AAA) screening. You may need this if you are a current or former smoker.  Osteoporosis. You may be screened starting at age 9 if you are at high risk. Talk with your health care provider about your test results, treatment options, and if necessary, the need for  more tests. Vaccines  Your health care provider may recommend certain vaccines, such as:  Influenza vaccine. This is recommended every year.  Tetanus, diphtheria, and acellular pertussis (Tdap, Td) vaccine. You may need a Td  booster every 10 years.  Zoster vaccine. You may need this after age 67.  Pneumococcal 13-valent conjugate (PCV13) vaccine. One dose is recommended after age 61.  Pneumococcal polysaccharide (PPSV23) vaccine. One dose is recommended after age 45. Talk to your health care provider about which screenings and vaccines you need and how often you need them. This information is not intended to replace advice given to you by your health care provider. Make sure you discuss any questions you have with your health care provider. Document Released: 02/23/2015 Document Revised: 10/17/2015 Document Reviewed: 11/28/2014 Elsevier Interactive Patient Education  2017 [place] Prevention in the Home Falls can cause injuries. They can happen to people of all ages. There are many things you can do to make your home safe and to help prevent falls. What can I do on the outside of my home?  Regularly fix the edges of walkways and driveways and fix any cracks.  Remove anything that might make you trip as you walk through a door, such as a raised step or threshold.  Trim any bushes or trees on the path to your home.  Use bright outdoor lighting.  Clear any walking paths of anything that might make someone trip, such as rocks or tools.  Regularly check to see if handrails are loose or broken. Make sure that both sides of any steps have handrails.  Any raised decks and porches should have guardrails on the edges.  Have any leaves, snow, or ice cleared regularly.  Use sand or salt on walking paths during winter.  Clean up any spills in your garage right away. This includes oil or grease spills. What can I do in the bathroom?  Use night lights.  Install grab bars by the toilet and in the tub and shower. Do not use towel bars as grab bars.  Use non-skid mats or decals in the tub or shower.  If you need to sit down in the shower, use a plastic, non-slip stool.  Keep the floor dry. Clean up  any water that spills on the floor as soon as it happens.  Remove soap buildup in the tub or shower regularly.  Attach bath mats securely with double-sided non-slip rug tape.  Do not have throw rugs and other things on the floor that can make you trip. What can I do in the bedroom?  Use night lights.  Make sure that you have a light by your bed that is easy to reach.  Do not use any sheets or blankets that are too big for your bed. They should not hang down onto the floor.  Have a firm chair that has side arms. You can use this for support while you get dressed.  Do not have throw rugs and other things on the floor that can make you trip. What can I do in the kitchen?  Clean up any spills right away.  Avoid walking on wet floors.  Keep items that you use a lot in easy-to-reach places.  If you need to reach something above you, use a strong step stool that has a grab bar.  Keep electrical cords out of the way.  Do not use floor polish or wax that makes floors slippery. If you must use wax, use non-skid  floor wax.  Do not have throw rugs and other things on the floor that can make you trip. What can I do with my stairs?  Do not leave any items on the stairs.  Make sure that there are handrails on both sides of the stairs and use them. Fix handrails that are broken or loose. Make sure that handrails are as long as the stairways.  Check any carpeting to make sure that it is firmly attached to the stairs. Fix any carpet that is loose or worn.  Avoid having throw rugs at the top or bottom of the stairs. If you do have throw rugs, attach them to the floor with carpet tape.  Make sure that you have a light switch at the top of the stairs and the bottom of the stairs. If you do not have them, ask someone to add them for you. What else can I do to help prevent falls?  Wear shoes that:  Do not have high heels.  Have rubber bottoms.  Are comfortable and fit you well.  Are  closed at the toe. Do not wear sandals.  If you use a stepladder:  Make sure that it is fully opened. Do not climb a closed stepladder.  Make sure that both sides of the stepladder are locked into place.  Ask someone to hold it for you, if possible.  Clearly mark and make sure that you can see:  Any grab bars or handrails.  First and last steps.  Where the edge of each step is.  Use tools that help you move around (mobility aids) if they are needed. These include:  Canes.  Walkers.  Scooters.  Crutches.  Turn on the lights when you go into a dark area. Replace any light bulbs as soon as they burn out.  Set up your furniture so you have a clear path. Avoid moving your furniture around.  If any of your floors are uneven, fix them.  If there are any pets around you, be aware of where they are.  Review your medicines with your doctor. Some medicines can make you feel dizzy. This can increase your chance of falling. Ask your doctor what other things that you can do to help prevent falls. This information is not intended to replace advice given to you by your health care provider. Make sure you discuss any questions you have with your health care provider. Document Released: 11/23/2008 Document Revised: 07/05/2015 Document Reviewed: 03/03/2014 Elsevier Interactive Patient Education  2017 [employer].

## 2020-07-18 NOTE — Progress Notes (Signed)
PCP notes:  Health Maintenance: Shingrix- due    Abnormal Screenings: none   Patient concerns: none   Nurse concerns: none   Next PCP appt.: 01/07/2021 @ 11 am

## 2020-07-18 NOTE — Progress Notes (Signed)
Subjective:   [name] is a 86 y.o. male who presents for Medicare Annual/Subsequent preventive examination.  Review of Systems: N/A      I connected with the patient today by telephone and verified that I am speaking with the correct person using two identifiers. Location patient: home Location nurse: work Persons participating in the telephone visit: patient, nurse.   I discussed the limitations, risks, security and privacy concerns of performing an evaluation and management service by telephone and the availability of in person appointments. I also discussed with the patient that there may be a patient responsible charge related to this service. The patient expressed understanding and verbally consented to this telephonic visit.        Cardiac Risk Factors include: advanced age (>10mn, >>30women);diabetes mellitus;hypertension;male gender;Other (see comment), Risk factor comments: hyperlipidemia     Objective:    Today's Vitals   There is no height or weight on file to calculate BMI.  Advanced Directives 07/18/2020 04/01/2020 04/01/2020 03/29/2020 03/16/2018 12/25/2017 03/02/2017  Does Patient Have a Medical Advance Directive? Yes - Yes Yes Yes Yes No  Type of Advance Directive [place] will Living will - Living will [place] will -  Does patient want to make changes to medical advance directive? - No - Patient declined No - Patient declined No - Patient declined No - Patient declined - -  Copy of [place] Chart? No - copy requested - - - No - copy requested No - copy requested -  Would patient like information on creating a medical advance directive? - - - - - - No - Patient declined  Pre-existing out of facility DNR order (yellow form or pink MOST form) - - - - - - -    Current Medications (verified) Outpatient Encounter Medications as of 07/18/2020   Medication Sig  . Accu-Chek Softclix Lancets lancets TEST BLOOD SUGAR UP TO THREE TIMES DAILY  . amLODipine (NORVASC) 10 MG tablet Take 0.5 tablets (5 mg total) by mouth every morning.  .Marland Kitchenaspirin EC 81 MG tablet Take 81 mg by mouth every evening.  . Blood Glucose Calibration (ACCU-CHEK AVIVA) SOLN Use as directed monthly. E11.59  Insulin dependent (Patient taking differently: Use as directed monthly. E11.59  Insulin dependent)  . Blood Glucose Monitoring Suppl (ACCU-CHEK AVIVA PLUS) w/Device KIT Check sugar up to 3 times daily. E11.59  Insulin dependent  . carvedilol (COREG) 12.5 MG tablet TAKE 1 TABLET TWICE DAILY WITH MEALS  . COD LIVER OIL PO Take 1 capsule by mouth 2 (two) times daily.   . cyanocobalamin (,VITAMIN B-12,) 1000 MCG/ML injection Inject 1 mL (1,000 mcg total) into the muscle every 30 (thirty) days. Dispense 3 of the 1 mL vials.  . DROPLET PEN NEEDLES 30G X 8 MM MISC USE  WITH  INSULIN  PEN  . glimepiride (AMARYL) 4 MG tablet Take 1/2 tablet every day with breakfast  . glucose blood (ONETOUCH ULTRA) test strip Use as instructed  . hydrALAZINE (APRESOLINE) 50 MG tablet Take 1 tablet (50 mg total) by mouth 3 (three) times daily.  . insulin glargine, 1 Unit Dial, ([name] SOLOSTAR) 300 UNIT/ML Solostar Pen Inject 12 Units into the skin at bedtime.  . insulin lispro (HUMALOG KWIKPEN) 100 UNIT/ML KwikPen Inject 0.04-0.1 mLs (4-10 Units total) into the skin 3 (three) times daily. INJECT INTO SKIN 3 TIMES DAILY BEFORE MEALS USING SLIDING SCALE. 151 TO 200 =  4 UNITS, 201 TO 250= 6 UNITS, 251 TO 300= 8 UNITS, > 300 = 10 units  . lisinopril-hydrochlorothiazide (ZESTORETIC) 20-25 MG tablet Take 1 tablet by mouth every morning.  . potassium chloride SA (KLOR-CON M20) 20 MEQ tablet Take 1 tablet (20 mEq total) by mouth daily.  . SYRINGE-NEEDLE, DISP, 3 ML (B-D 3CC LUER-LOK SYR 25GX1") 25G X 1" 3 ML MISC USE TO INJECT VITAMIN B12 MONTHLY.  . tamsulosin (FLOMAX) 0.4 MG CAPS capsule TAKE 1 CAPSULE  EVERY DAY  AFTER  SUPPER   No facility-administered encounter medications on file as of 07/18/2020.    Allergies (verified) Codeine phosphate, Morphine, Atorvastatin, and Metformin and related   History: Past Medical History:  Diagnosis Date  . Anxiety   . Borderline hyperlipidemia   . CAD (coronary artery disease)    pt. denies at preop  . Colon polyps   . Complication of anesthesia    trouble urinating after anesthesia  . Diverticulosis of colon   . DJD (degenerative joint disease)   . DM (diabetes mellitus) ([place])    Adult onset  type 2  . History of gastritis   . History of kidney stones    surgery to remove  . History of pyelonephritis   . History of syncope   . HTN (hypertension)   . Hypertrophy of prostate with urinary obstruction and other lower urinary tract symptoms (LUTS)   . Melanoma ([place])    back of neck  . Melanoma ([place]) 2022   per dermatology  . Myoclonus   . Pancreatitis due to biliary obstruction several years ago   treated by Dr. Delfin Edis   Past Surgical History:  Procedure Laterality Date  . APPENDECTOMY    . cartarized  nose blood vessel     left side  . CATARACT EXTRACTION     RIGHT EYE  . CHOLECYSTECTOMY    . cystoscopy  04/17/11   stent placed  . CYSTOSCOPY W/ URETERAL STENT PLACEMENT  04/17/2011   Procedure: CYSTOSCOPY WITH RETROGRADE PYELOGRAM/URETERAL STENT PLACEMENT;  Surgeon: Hanley Ben, MD;  Location: [facility];  Service: Urology;  Laterality: Left;  . CYSTOSCOPY WITH RETROGRADE PYELOGRAM, URETEROSCOPY AND STENT PLACEMENT Left 04/18/2013   Procedure: CYSTOSCOPY WITH LEFT RETROGRADE PYELOGRAM, LEFT URETEROSCOPY AND LASER LITHOTRIPSY LEFT STENT PLACEMENT;  Surgeon: Dutch Gray, MD;  Location: [facility];  Service: Urology;  Laterality: Left;  . HOLMIUM LASER APPLICATION Left 02/19/6220   Procedure: HOLMIUM LASER APPLICATION;  Surgeon: Dutch Gray, MD;  Location: [facility];  Service: Urology;  Laterality: Left;  . INCISION AND DRAINAGE ABSCESS Left  04/04/2020   Procedure: Sharren Bridge OF LEFT ARM HEMATOMA;  Surgeon: Stark Klein, MD;  Location: [facility];  Service: General;  Laterality: Left;  left  . IRRIGATION AND DEBRIDEMENT ABSCESS Left 10/05/2014   Procedure: IAND D LEFT INDEX FINGER;  Surgeon: Dayna Barker, MD;  Location: [facility];  Service: Plastics;  Laterality: Left;  . KNEE SURGERY     bilateral ARTHROSCOPY X2 TO EACH KNEE  . MELANOMA EXCISION  2018  . MELANOMA EXCISION WITH SENTINEL LYMPH NODE BIOPSY Left 03/29/2020   Procedure: WIDE LOCAL EXCISION, ADVANCED FLAP CLOSURE LEFT ARM MELANOMA DEFECT WITH SENTINEL LYMPH NODE MAPPING AND BIOPSY;  Surgeon: Stark Klein, MD;  Location: [facility];  Service: General;  Laterality: Left;  . S/P cysto & stents for kidnedy stone 11/08 by Dr. Reece Agar    . 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  SHOULDER SURGERY  04/2005   left by Dr. Berenice Primas   Family History  Problem Relation Age of Onset  . Heart attack Mother   . Dementia Brother   . Colon cancer Neg Hx   . Prostate cancer Neg Hx    Social History   Socioeconomic History  . Marital status: Widowed    Spouse name: Not on file  . Number of children: 2  . Years of education: Not on file  . Highest education level: Not on file  Occupational History  . Occupation: Retired  Tobacco Use  . Smoking status: Never Smoker  . Smokeless tobacco: Never Used  Vaping Use  . Vaping Use: Never used  Substance and Sexual Activity  . Alcohol use: No    Alcohol/week: 0.0 standard drinks  . Drug use: No  . Sexual activity: Not Currently  Other Topics Concern  . Not on file  Social History Narrative   Widowed April 21, 2004   Initially 2 kids- 1 son still alive as of 21-Apr-2018.  Daughter died 2018/06/20 in coronavirus pandemic.  Son lives with patient.    Enjoys hunting and fishing   Retired from [employer] and [employer] work   Investment banker, operational of Radio broadcast assistant Strain: [place]   . Difficulty  of Paying Living Expenses: Not hard at all  Food Insecurity: No Food Insecurity  . Worried About Charity fundraiser in the Last Year: Never true  . Ran Out of Food in the Last Year: Never true  Transportation Needs: No Transportation Needs  . Lack of Transportation (Medical): No  . Lack of Transportation (Non-Medical): No  Physical Activity: Sufficiently Active  . Days of Exercise per Week: 7 days  . Minutes of Exercise per Session: 30 min  Stress: No Stress Concern Present  . Feeling of Stress : Not at all  Social Connections: Not on file    Tobacco Counseling Counseling given: Not Answered   Clinical Intake:  Pre-visit preparation completed: Yes  Pain : No/denies pain     Nutritional Risks: None Diabetes: No  How often do you need to have someone help you when you read instructions, pamphlets, or other written materials from your doctor or pharmacy?: 1 - Never  Diabetic: Yes Nutrition Risk Assessment:  Has the patient had any N/V/D within the last 2 months?  No  Does the patient have any non-healing wounds?  No  Has the patient had any unintentional weight loss or weight gain?  No   Diabetes:  Is the patient diabetic?  Yes  If diabetic, was a CBG obtained today?  No  Did the patient bring in their glucometer from home?  No  How often do you monitor your CBG's? daily.   Financial Strains and Diabetes Management:  Are you having any financial strains with the device, your supplies or your medication? No .  Does the patient want to be seen by Chronic Care Management for management of their diabetes?  No  Would the patient like to be referred to a Nutritionist or for Diabetic Management?  No   Diabetic Exams:  Diabetic Eye Exam: Completed 07/04/2020 Diabetic Foot Exam: Completed 07/03/2020   Interpreter Needed?: No  Information entered by :: CJohnson, LPN   Activities of Daily Living In your present state of health, do you have any difficulty performing  the following activities: 07/18/2020 04/01/2020  Hearing? N -  Vision? N -  Difficulty concentrating or making decisions? N -  Walking or  climbing stairs? N -  Dressing or bathing? N -  Doing errands, shopping? N Y  Conservation officer, nature and eating ? N -  Using the Toilet? N -  In the past six months, have you accidently leaked urine? N -  Do you have problems with loss of bowel control? N -  Managing your Medications? N -  Managing your Finances? N -  Housekeeping or managing your Housekeeping? N -  Some recent data might be hidden    Patient Care Team: Tonia Ghent, MD as PCP - General (Family Medicine) Jerline Pain, MD as PCP - Cardiology (Cardiology) Clent Jacks, MD as Consulting Physician (Ophthalmology) Druscilla Brownie, MD as Consulting Physician (Dermatology) Frederik Pear, MD as Consulting Physician (Orthopedic Surgery) Raynelle Bring, MD as Consulting Physician (Urology) Moss Mc, DDS as Referring Physician (Dentistry) Debbora Dus, [facility] as Pharmacist (Pharmacist)  Indicate any recent Medical Services you may have received from other than [facility] providers in the past year (date may be approximate).     Assessment:   This is a routine wellness examination for Gregor.  Hearing/Vision screen  Hearing Screening   _0  _1  _2  _3  _4  _5  _6  _7  _8   Right ear:           Left ear:           Vision Screening Comments: Patient gets annual eye exams   Dietary issues and exercise activities discussed: Current Exercise Habits: Home exercise routine, Type of exercise: walking, Time (Minutes): 30, Frequency (Times/Week): 7, Weekly Exercise (Minutes/Week): 210, Intensity: Moderate, Exercise limited by: None identified  Goals Addressed            This Visit's Progress   . Patient Stated       07/18/2020, I will continue to walk 30 minutes daily.      Depression Screen PHQ 2/9 Scores 07/18/2020 01/03/2020 10/25/2018 12/25/2017 12/17/2016 12/17/2015  06/18/2012  PHQ - 2 Score 0 0 0 0 0 0 1  PHQ- 9 Score 0 - - 0 0 - -    Fall Risk Fall Risk  07/18/2020 01/03/2020 10/25/2018 12/25/2017 12/17/2016  Falls in the past year? 0 0 0 0 No  Number falls in past yr: 0 0 - - -  Injury with Fall? 0 0 - - -  Risk for fall due to : Medication side effect - - - -  Follow up Falls evaluation completed;Falls prevention discussed Falls evaluation completed - - -    FALL RISK PREVENTION PERTAINING TO THE HOME:  Any stairs in or around the home? Yes  If so, are there any without handrails? No  Home free of loose throw rugs in walkways, pet beds, electrical cords, etc? Yes  Adequate lighting in your home to reduce risk of falls? Yes   ASSISTIVE DEVICES UTILIZED TO PREVENT FALLS:  Life alert? No  Use of a cane, walker or w/c? No  Grab bars in the bathroom? No  Shower chair or bench in shower? No  Elevated toilet seat or a handicapped toilet? No   TIMED UP AND GO:  Was the test performed? N/A telephone visit .   Cognitive Function: MMSE - Mini Mental State Exam 07/18/2020 12/25/2017 12/17/2016 12/17/2015 05/04/2014  Orientation to time _9 Orientation to Place _10 Registration _11 Attention/ Calculation 5 0 0 0 0  Recall _12 Recall-comments - - - pt was unable  to recall 1 of 3 words -  Language- name 2 objects - 0 0 0 2  Language- repeat _0 Language- follow 3 step command - _1 Language- follow 3 step command-comments - - unable to follow 1 step of 3 step command - -  Language- read & follow direction - 0 0 0 1  Write a sentence - 0 0 0 1  Copy design - 0 0 0 0  Total score - _2 Mini Cog  Mini-Cog screen was completed. Maximum score is 22. A value of 0 denotes this part of the MMSE was not completed or the patient failed this part of the Mini-Cog screening.       Immunizations Immunization History  Administered Date(s) Administered  . Fluad Quad(high Dose 65+) 10/25/2018, 01/03/2020  .  Influenza Split 11/30/2010, 11/24/2011  . Influenza Whole 03/27/2009  . Influenza, High Dose Seasonal PF 12/05/2014, 12/17/2016, 12/25/2017  . Influenza,inj,Quad PF,6+ Mos 12/29/2012  . Influenza-Unspecified 11/10/2013  . Moderna Sars-Covid-2 Vaccination 04/21/2019, 05/23/2019, 12/20/2019, 06/07/2020  . Pneumococcal Conjugate-13 11/10/2013  . Pneumococcal Polysaccharide-23 09/06/2004  . Td 02/12/2009  . Tdap 10/09/2011, 08/15/2014  . Zoster, Live 07/28/2013    TDAP status: Up to date  Flu Vaccine status: Up to date  Pneumococcal vaccine status: Up to date  Covid-19 vaccine status: Completed vaccines  Qualifies for Shingles Vaccine? Yes   Zostavax completed Yes   Shingrix Completed?: No.    Education has been provided regarding the importance of this vaccine. Patient has been advised to call insurance company to determine out of pocket expense if they have not yet received this vaccine. Advised may also receive vaccine at local pharmacy or Health Dept. Verbalized acceptance and understanding.  Screening Tests Health Maintenance  Topic Date Due  . Pneumococcal Vaccine 29-12 Years old (1 of 4 - PCV13) Never done  . Zoster Vaccines- Shingrix (1 of 2) Never done  . INFLUENZA VACCINE  09/10/2020  . HEMOGLOBIN A1C  01/03/2021  . FOOT EXAM  07/03/2021  . OPHTHALMOLOGY EXAM  07/04/2021  . TETANUS/TDAP  08/14/2024  . COVID-19 Vaccine  Completed  . PNA vac Low Risk Adult  Completed  . HPV VACCINES  Aged Out    Health Maintenance  Health Maintenance Due  Topic Date Due  . Pneumococcal Vaccine 13-24 Years old (1 of 4 - PCV13) Never done  . Zoster Vaccines- Shingrix (1 of 2) Never done    Colorectal cancer screening: No longer required.   Lung Cancer Screening: (Low Dose CT Chest recommended if Age 33-80 years, 30 pack-year currently smoking OR have quit w/in 15 years.) does not qualify.   Additional Screening:  Hepatitis C Screening: does not qualify; Completed N/A  Vision  Screening: Recommended annual ophthalmology exams for early detection of glaucoma and other disorders of the eye. Is the patient up to date with their annual eye exam?  Yes  Who is the provider or what is the name of the office in which the patient attends annual eye exams? Dr. Katy Fitch  If pt is not established with a provider, would they like to be referred to a provider to establish care? No .   Dental Screening: Recommended annual dental exams for proper oral hygiene  Community Resource Referral / Chronic Care Management: CRR required this visit?  No   CCM required this visit?  No      Plan:     I have  personally reviewed and noted the following in the patient's chart:   . Medical and social history . Use of alcohol, tobacco or illicit drugs  . Current medications and supplements including opioid prescriptions. Patient is not currently taking opioid prescriptions. . Functional ability and status . Nutritional status . Physical activity . Advanced directives . List of other physicians . Hospitalizations, surgeries, and ER visits in previous 12 months . Vitals . Screenings to include cognitive, depression, and falls . Referrals and appointments  In addition, I have reviewed and discussed with patient certain preventive protocols, quality metrics, and best practice recommendations. A written personalized care plan for preventive services as well as general preventive health recommendations were provided to patient.   Due to this being a telephonic visit, the after visit summary with patients personalized plan was offered to patient via office or my-chart. Patient preferred to pick up at office at next visit or via mychart.   Andrez Grime, LPN   05/18/1578

## 2020-07-19 ENCOUNTER — Telehealth: Payer: Self-pay

## 2020-07-19 NOTE — Telephone Encounter (Signed)
Agree with taking glimepiride with food.  If any other low sugars, then please notify us.  Thanks.

## 2020-07-19 NOTE — Telephone Encounter (Signed)
Son called pt and noticed he was "talking weird". Checked his FBS and it was 85. His normal FBS is about 120. Pt is going to drink some orange juice. I was asking if he had taken his glimepiride already prior to eating breakfast. Son was unsure. Son gave me the pt's number [phone]. I will call the pt and call son back at [phone].  Called and spoke to pt. He said he drank orange juice and up to 179. States he is feeling fine. He does take his glimepiride before eating sometimes. He had done that today. I suggested he take it with food.

## 2020-07-19 NOTE — Telephone Encounter (Signed)
Spoke to pt's son. He will remind him to take it as we discussed and will call us or have pt call us if he has anymore low readings.

## 2020-09-25 ENCOUNTER — Other Ambulatory Visit: Payer: Self-pay | Admitting: Family Medicine

## 2020-10-30 ENCOUNTER — Telehealth: Payer: Self-pay | Admitting: Family Medicine

## 2020-10-31 MED ORDER — TOUJEO SOLOSTAR 300 UNIT/ML ~~LOC~~ SOPN: 12.0000 [IU] | PEN_INJECTOR | Freq: Every evening | SUBCUTANEOUS | 3 refills | Status: DC

## 2020-10-31 NOTE — Addendum Note (Signed)
Addended by: Sherrilee Gilles B on: 10/31/2020 02:44 PM   Modules accepted: Orders

## 2020-10-31 NOTE — Telephone Encounter (Signed)
Rx sent 

## 2020-11-13 ENCOUNTER — Other Ambulatory Visit: Payer: Self-pay | Admitting: Family Medicine

## 2020-11-14 NOTE — Telephone Encounter (Signed)
LMTCB to see if patient is still taking rx.

## 2020-11-16 NOTE — Telephone Encounter (Signed)
Pt call in stated he is still taking traMADol HCl 50 MG and would like a refill . Please advise # [phone]

## 2020-11-19 NOTE — Telephone Encounter (Signed)
Refill request for Tramadol 50 mg tablets  LOV - 07/03/20 Next OV - 01/07/21 Last refill - Looks like was 03/29/20 #15/0 but was hard to tell

## 2020-11-20 NOTE — Telephone Encounter (Signed)
Last filled 06/25/2020 per PDMP.  Sent. Thanks.

## 2020-11-26 ENCOUNTER — Telehealth: Payer: Self-pay | Admitting: Family Medicine

## 2020-11-26 NOTE — Telephone Encounter (Signed)
Son called in and stated that he needs a refill on the humalog and the phamracy stated that they would need to call the pharmacy due to he has switched back to [facility] and they need the okay to get it filled. And they need the approval from Dr. Damita Dunnings [phone]  Encourage patient to contact the pharmacy for refills or they can request refills through [place]:  Please schedule appointment if longer than 1 year  NEXT APPOINTMENT DATE:  MEDICATION: humalog quickpen   Is the patient out of medication? yes  PHARMACY[facility]   Let patient know to contact pharmacy at the end of the day to make sure medication is ready.  Please notify patient to allow 48-72 hours to process  CLINICAL FILLS OUT ALL BELOW:   LAST REFILL:  QTY:  REFILL DATE:    OTHER COMMENTS:    Okay for refill?  Please advise

## 2020-11-27 MED ORDER — INSULIN LISPRO (1 UNIT DIAL) 100 UNIT/ML (KWIKPEN): 4.0000 [IU] | PEN_INJECTOR | Freq: Three times a day (TID) | SUBCUTANEOUS | 3 refills | Status: DC

## 2020-11-27 NOTE — Telephone Encounter (Signed)
Rx sent. LM on VM for son that this was done.

## 2020-12-20 ENCOUNTER — Other Ambulatory Visit: Payer: Self-pay | Admitting: Family Medicine

## 2021-01-01 ENCOUNTER — Other Ambulatory Visit: Payer: Self-pay | Admitting: Family Medicine

## 2021-01-07 ENCOUNTER — Encounter: Payer: Self-pay | Admitting: Family Medicine

## 2021-01-07 ENCOUNTER — Ambulatory Visit (INDEPENDENT_AMBULATORY_CARE_PROVIDER_SITE_OTHER): Payer: Medicare Other | Admitting: Family Medicine

## 2021-01-07 ENCOUNTER — Other Ambulatory Visit: Payer: Self-pay

## 2021-01-07 VITALS — BP 140/68 | HR 104 | Temp 97.9°F | Ht 72.0 in | Wt 228.0 lb

## 2021-01-07 DIAGNOSIS — R000 Tachycardia, unspecified: Secondary | ICD-10-CM

## 2021-01-07 DIAGNOSIS — E538 Deficiency of other specified B group vitamins: Secondary | ICD-10-CM

## 2021-01-07 DIAGNOSIS — I10 Essential (primary) hypertension: Secondary | ICD-10-CM | POA: Diagnosis not present

## 2021-01-07 DIAGNOSIS — E1159 Type 2 diabetes mellitus with other circulatory complications: Secondary | ICD-10-CM

## 2021-01-07 DIAGNOSIS — Z23 Encounter for immunization: Secondary | ICD-10-CM | POA: Diagnosis not present

## 2021-01-07 LAB — LIPID PANEL
Cholesterol: 143 mg/dL (ref 0–200)
HDL: 32.6 mg/dL — ABNORMAL LOW (ref >39.00)
LDL Cholesterol: 82 mg/dL (ref 0–99)
NonHDL: 110.66
Total CHOL/HDL Ratio: 4
Triglycerides: 141 mg/dL (ref 0.0–149.0)
VLDL: 28.2 mg/dL (ref 0.0–40.0)

## 2021-01-07 LAB — CBC WITH DIFFERENTIAL/PLATELET
Basophils Absolute: 0.1 10*3/uL (ref 0.0–0.1)
Basophils Relative: 0.8 % (ref 0.0–3.0)
Eosinophils Absolute: 0.3 10*3/uL (ref 0.0–0.7)
Eosinophils Relative: 4.3 % (ref 0.0–5.0)
HCT: 43.2 % (ref 39.0–52.0)
Hemoglobin: 14.4 g/dL (ref 13.0–17.0)
Lymphocytes Relative: 22.2 % (ref 12.0–46.0)
Lymphs Abs: 1.7 10*3/uL (ref 0.7–4.0)
MCHC: 33.3 g/dL (ref 30.0–36.0)
MCV: 92.9 fl (ref 78.0–100.0)
Monocytes Absolute: 0.8 10*3/uL (ref 0.1–1.0)
Monocytes Relative: 10.9 % (ref 3.0–12.0)
Neutro Abs: 4.8 10*3/uL (ref 1.4–7.7)
Neutrophils Relative %: 61.8 % (ref 43.0–77.0)
Platelets: 333 10*3/uL (ref 150.0–400.0)
RBC: 4.65 Mil/uL (ref 4.22–5.81)
RDW: 13.4 % (ref 11.5–15.5)
WBC: 7.8 10*3/uL (ref 4.0–10.5)

## 2021-01-07 LAB — COMPREHENSIVE METABOLIC PANEL
ALT: 12 U/L (ref 0–53)
AST: 12 U/L (ref 0–37)
Albumin: 4.2 g/dL (ref 3.5–5.2)
Alkaline Phosphatase: 81 U/L (ref 39–117)
BUN: 18 mg/dL (ref 6–23)
CO2: 30 mEq/L (ref 19–32)
Calcium: 10.2 mg/dL (ref 8.4–10.5)
Chloride: 99 mEq/L (ref 96–112)
Creatinine, Ser: 1.22 mg/dL (ref 0.40–1.50)
GFR: 54.62 mL/min — ABNORMAL LOW (ref >60.00)
Glucose, Bld: 147 mg/dL — ABNORMAL HIGH (ref 70–99)
Potassium: 4.2 mEq/L (ref 3.5–5.1)
Sodium: 139 mEq/L (ref 135–145)
Total Bilirubin: 0.6 mg/dL (ref 0.2–1.2)
Total Protein: 7 g/dL (ref 6.0–8.3)

## 2021-01-07 LAB — HEMOGLOBIN A1C: Hgb A1c MFr Bld: 8.3 % — ABNORMAL HIGH (ref 4.6–6.5)

## 2021-01-07 LAB — TSH: TSH: 3.72 u[IU]/mL (ref 0.35–5.50)

## 2021-01-07 LAB — VITAMIN B12: Vitamin B-12: 367 pg/mL (ref 211–911)

## 2021-01-07 NOTE — Patient Instructions (Signed)
Change the dressing every few days, sooner if needed.  The blister should gradually heal over.  Update me as needed.   Take care.  Glad to see you. Plan on recheck in about 6 months but we'll update you about your labs.

## 2021-01-07 NOTE — Progress Notes (Signed)
This visit occurred during the SARS-CoV-2 public health emergency.  Safety protocols were in place, including screening questions prior to the visit, additional usage of staff PPE, and extensive cleaning of exam room while observing appropriate contact time as indicated for disinfecting solutions.  Diabetes:  Using medications without difficulties: yes Hypoglycemic episodes:no Hyperglycemic episodes:no Feet problems: R foot blister, R 4th toe.  Noted a few days ago.  See exam.  Noted after working in the yard.  Blood Sugars averaging: 190s this AM usually 100-150.   eye exam within last year: yes  Hypertension:    Using medication without problems or lightheadedness: yes Chest pain with exertion:no Edema:no Short of breath: no Labs pending.    Flu shot today.    Still on B12 shot monthly.  He noted temporary higher sugars after B12 shot.  Due for recheck lab today.  Compliant with injection at home.  Last B12 shot >2 weeks ago.  See notes on labs.    PMH and SH reviewed  Meds, vitals, and allergies reviewed.   ROS: Per HPI unless specifically indicated in ROS section   GEN: nad, alert and oriented HEENT: ncat NECK: supple w/o LA CV: rrr with occ ectopy noted.   PULM: ctab, no inc wob ABD: soft, +bs EXT: no edema SKIN: no acute rash  Diabetic foot exam: Normal inspection except for superficial small blister that is clean and not infected on the lateral side of the R4th toe.  Covered with hydrocolloid dressing, tolerated well.  It felt better with the bandage in place. No skin breakdown No calluses  Normal DP pulses Normal sensation to light touch and monofilament Nails thickened

## 2021-01-09 DIAGNOSIS — R000 Tachycardia, unspecified: Secondary | ICD-10-CM | POA: Insufficient documentation

## 2021-01-09 NOTE — Assessment & Plan Note (Signed)
See notes on labs.  Foot care discussed with patient.  He can use hydrocolloid dressing in the meantime and he will let me know if is not improving.  I asked him to check his foot daily.  It should heal over.  Continue basal and sliding scale insulin along with glimepiride.

## 2021-01-09 NOTE — Assessment & Plan Note (Signed)
Initially noted on exam.  Not tachycardic on recheck.  EKG done for ectopy but these were noted to be PACs and benign.  Discussed with patient.  No acute changes.  Routine cautions given to patient.  See notes on labs.

## 2021-01-09 NOTE — Assessment & Plan Note (Signed)
Continue amlodipine hydralazine lisinopril hydrochlorothiazide.  See notes on labs.

## 2021-01-09 NOTE — Assessment & Plan Note (Signed)
I am unclear as to why he would have higher sugars after B12 shot.  Still reasonable to continue as is for now.  See notes on labs.  Continue with B12 replacement.

## 2021-01-17 ENCOUNTER — Other Ambulatory Visit: Payer: Self-pay | Admitting: Family Medicine

## 2021-05-28 ENCOUNTER — Other Ambulatory Visit: Payer: Self-pay | Admitting: Family Medicine

## 2021-05-28 ENCOUNTER — Telehealth: Payer: Self-pay | Admitting: Family Medicine

## 2021-05-28 NOTE — Telephone Encounter (Signed)
Refill request for TRAMADOL HCL 50 MG TABLET 50 Tablet ? ?LOV - 01/07/21 ?Next OV - not scheduled ?Last refill - 11/20/20 #60/2 ? ?

## 2021-05-28 NOTE — Telephone Encounter (Signed)
Patient's sister called about a medication refill for Tramadol 50 mg. The sister states he is in pain and wants to speak about medication refill.  ?

## 2021-05-28 NOTE — Telephone Encounter (Signed)
Refill request has already been sent to Dr. Damita Dunnings this am.  ?

## 2021-05-29 MED ORDER — TRAMADOL HCL 50 MG PO TABS
ORAL_TABLET | ORAL | 2 refills | Status: DC
Start: 2021-05-29 — End: 2021-07-26

## 2021-05-29 NOTE — Telephone Encounter (Signed)
Sent. Thanks.   

## 2021-06-16 ENCOUNTER — Other Ambulatory Visit: Payer: Self-pay | Admitting: Family Medicine

## 2021-06-21 ENCOUNTER — Other Ambulatory Visit: Payer: Self-pay | Admitting: Family Medicine

## 2021-07-14 ENCOUNTER — Other Ambulatory Visit: Payer: Self-pay | Admitting: Family Medicine

## 2021-07-14 DIAGNOSIS — E1159 Type 2 diabetes mellitus with other circulatory complications: Secondary | ICD-10-CM

## 2021-07-19 ENCOUNTER — Ambulatory Visit (INDEPENDENT_AMBULATORY_CARE_PROVIDER_SITE_OTHER): Payer: Medicare Other

## 2021-07-19 VITALS — Wt 228.0 lb

## 2021-07-19 DIAGNOSIS — Z0000 Encounter for general adult medical examination without abnormal findings: Secondary | ICD-10-CM | POA: Diagnosis not present

## 2021-07-19 NOTE — Progress Notes (Signed)
Virtual Visit via Telephone Note  I connected with  [name] on 07/19/21 at 11:00 AM EDT by telephone and verified that I am speaking with the correct person using two identifiers.  Location: Patient: home Provider: [place] Persons participating in the virtual visit: [place]   I discussed the limitations, risks, security and privacy concerns of performing an evaluation and management service by telephone and the availability of in person appointments. The patient expressed understanding and agreed to proceed.  Interactive audio and video telecommunications were attempted between this nurse and patient, however failed, due to patient having technical difficulties OR patient did not have access to video capability.  We continued and completed visit with audio only.  Some vital signs may be absent or patient reported.   Dionisio David, LPN  Subjective:   [name] is a 87 y.o. male who presents for Medicare Annual/Subsequent preventive examination.  Review of Systems           Objective:    There were no vitals filed for this visit. There is no height or weight on file to calculate BMI.     07/18/2020   10:30 AM 04/01/2020   11:37 PM 04/01/2020   11:36 PM 03/29/2020    6:21 AM 03/16/2018   11:32 AM 12/25/2017   11:00 AM 03/02/2017    5:34 AM  Advanced Directives  Does Patient Have a Medical Advance Directive? Yes  Yes Yes Yes Yes No  Type of Paramedic of [place];Living will Living will  Living will [place];Living will [place];Living will   Does patient want to make changes to medical advance directive?  No - Patient declined No - Patient declined No - Patient declined No - Patient declined    Copy of [place] in Chart? No - copy requested    No - copy requested No - copy requested   Would patient like information on creating a medical advance directive?        No - Patient declined    Current Medications (verified) Outpatient Encounter Medications as of 07/19/2021  Medication Sig   Accu-Chek Softclix Lancets lancets TEST BLOOD SUGAR UP TO THREE TIMES DAILY   amLODipine (NORVASC) 10 MG tablet TAKE 1 TABLET BY MOUTH IN  THE MORNING   aspirin EC 81 MG tablet Take 81 mg by mouth every evening.   Blood Glucose Calibration (ACCU-CHEK AVIVA) SOLN Use as directed monthly. E11.59  Insulin dependent (Patient taking differently: Use as directed monthly. E11.59  Insulin dependent)   Blood Glucose Monitoring Suppl (ACCU-CHEK AVIVA PLUS) w/Device KIT Check sugar up to 3 times daily. E11.59  Insulin dependent   carvedilol (COREG) 12.5 MG tablet TAKE 1 TABLET BY MOUTH  TWICE DAILY WITH MEALS   COD LIVER OIL PO Take 1 capsule by mouth 2 (two) times daily.    cyanocobalamin (,VITAMIN B-12,) 1000 MCG/ML injection Inject 1 mL (1,000 mcg total) into the muscle every 30 (thirty) days. Dispense 3 of the 1 mL vials.   DROPLET PEN NEEDLES 30G X 8 MM MISC USE  WITH  INSULIN  PEN   glimepiride (AMARYL) 4 MG tablet TAKE ONE-HALF TABLET BY  MOUTH DAILY WITH BREAKFAST   glucose blood (ONETOUCH ULTRA) test strip CHECK UP TO 3 TIMES DAILY  AS NEEDED   HUMALOG KWIKPEN 100 UNIT/ML KwikPen INJECT SUBCUTANEOUSLY 4-10  UNITS 3 TIMES DAILY BEFORE  MEALS PER SLIDING SCALE  151-200 4 UN, 201-250 6 UN, 251-300 8 UN, &gt;300 10 UN   hydrALAZINE (APRESOLINE) 50 MG tablet TAKE 1 TABLET BY MOUTH 3  TIMES DAILY   lisinopril-hydrochlorothiazide (ZESTORETIC) 20-25 MG tablet TAKE 1 TABLET BY MOUTH IN  THE MORNING   potassium chloride SA (KLOR-CON) 20 MEQ tablet TAKE 1 TABLET BY MOUTH  DAILY   SYRINGE-NEEDLE, DISP, 3 ML (B-D 3CC LUER-LOK SYR 25GX1") 25G X 1" 3 ML MISC USE TO INJECT VITAMIN B12 MONTHLY.   tamsulosin (FLOMAX) 0.4 MG CAPS capsule TAKE 1 CAPSULE BY MOUTH  DAILY AFTER SUPPER   [name] SOLOSTAR 300 UNIT/ML Solostar Pen INJECT SUBCUTANEOUSLY 12  UNITS AT BEDTIME   traMADol (ULTRAM) 50  MG tablet TAKE 1 TABLET BY MOUTH EVERY 12 HOURS AS NEEDED FOR KNEE PAIN. PLEASE CONTACT [facility] FOR AN APPOINTMENT.  CALL [facility].   No facility-administered encounter medications on file as of 07/19/2021.    Allergies (verified) Codeine phosphate, Morphine, Atorvastatin, and Metformin and related   History: Past Medical History:  Diagnosis Date   Anxiety    Borderline hyperlipidemia    CAD (coronary artery disease)    pt. denies at preop   Colon polyps    Complication of anesthesia    trouble urinating after anesthesia   Diverticulosis of colon    DJD (degenerative joint disease)    DM (diabetes mellitus) ([place])    Adult onset  type 2   History of gastritis    History of kidney stones    surgery to remove   History of pyelonephritis    History of syncope    HTN (hypertension)    Hypertrophy of prostate with urinary obstruction and other lower urinary tract symptoms (LUTS)    Melanoma (HCC)    back of neck   Melanoma ([place]) 2022   per dermatology   Myoclonus    Pancreatitis due to biliary obstruction several years ago   treated by Dr. Delfin Edis   Past Surgical History:  Procedure Laterality Date   APPENDECTOMY     cartarized  nose blood vessel     left side   CATARACT EXTRACTION     RIGHT EYE   CHOLECYSTECTOMY     cystoscopy  04/17/11   stent placed   CYSTOSCOPY W/ URETERAL STENT PLACEMENT  04/17/2011   Procedure: CYSTOSCOPY WITH RETROGRADE PYELOGRAM/URETERAL STENT PLACEMENT;  Surgeon: Hanley Ben, MD;  Location: [facility];  Service: Urology;  Laterality: Left;   CYSTOSCOPY WITH RETROGRADE PYELOGRAM, URETEROSCOPY AND STENT PLACEMENT Left 04/18/2013   Procedure: CYSTOSCOPY WITH LEFT RETROGRADE PYELOGRAM, LEFT URETEROSCOPY AND LASER LITHOTRIPSY LEFT STENT PLACEMENT;  Surgeon: Dutch Gray, MD;  Location: [facility];  Service: Urology;  Laterality: Left;   HOLMIUM LASER APPLICATION Left 03/16/2681   Procedure: HOLMIUM LASER APPLICATION;  Surgeon: Dutch Gray, MD;  Location: [facility];  Service: Urology;  Laterality: Left;   INCISION AND DRAINAGE ABSCESS Left 04/04/2020   Procedure: Sharren Bridge OF LEFT ARM HEMATOMA;  Surgeon: Stark Klein, MD;  Location: [facility];  Service: General;  Laterality: Left;  left   IRRIGATION AND DEBRIDEMENT ABSCESS Left 10/05/2014   Procedure: Annett Fabian D LEFT INDEX FINGER;  Surgeon: Dayna Barker, MD;  Location: [facility];  Service: Plastics;  Laterality: Left;   KNEE SURGERY     bilateral ARTHROSCOPY X2 TO EACH KNEE   MELANOMA EXCISION  2018   MELANOMA EXCISION WITH SENTINEL LYMPH NODE BIOPSY Left 03/29/2020   Procedure: WIDE LOCAL EXCISION, ADVANCED  FLAP CLOSURE LEFT ARM MELANOMA DEFECT WITH SENTINEL LYMPH NODE MAPPING AND BIOPSY;  Surgeon: Stark Klein, MD;  Location: [facility];  Service: General;  Laterality: Left;   S/P cysto & stents for kidnedy stone 11/08 by Dr. Reece Agar     S/P ELap 04-25-1984 w/excision of leiomyoma @ GE junction, Meckle's divertic resected, & cholecystectomy     SHOULDER SURGERY  04/2005   left by Dr. Berenice Primas   Family History  Problem Relation Age of Onset   Heart attack Mother    Dementia Brother    Colon cancer Neg Hx    Prostate cancer Neg Hx    Social History   Socioeconomic History   Marital status: Widowed    Spouse name: Not on file   Number of children: 2   Years of education: Not on file   Highest education level: Not on file  Occupational History   Occupation: Retired  Tobacco Use   Smoking status: Never   Smokeless tobacco: Never  Vaping Use   Vaping Use: Never used  Substance and Sexual Activity   Alcohol use: No    Alcohol/week: 0.0 standard drinks of alcohol   Drug use: No   Sexual activity: Not Currently  Other Topics Concern   Not on file  Social History Narrative   Widowed 04-25-2004   Initially 2 kids- 1 son still alive as of April 25, 2018.  Daughter died Jun 24, 2018 in coronavirus pandemic.  Son lives with patient.    Enjoys hunting and fishing   Retired from [employer] and [employer] work    Social Determinants of Radio broadcast assistant Strain: Bartholomew  (07/18/2020)   Overall Financial Resource Strain (CARDIA)    Difficulty of Paying Living Expenses: Not hard at all  Food Insecurity: No Food Insecurity (07/18/2020)   Hunger Vital Sign    Worried About Running Out of Food in the Last Year: Never true    [place] in the Last Year: Never true  Transportation Needs: No Transportation Needs (07/18/2020)   PRAPARE - Hydrologist (Medical): No    Lack of Transportation (Non-Medical): No  Physical Activity: Sufficiently Active (07/18/2020)   Exercise Vital Sign    Days of Exercise per Week: 7 days    Minutes of Exercise per Session: 30 min  Stress: No Stress Concern Present (07/18/2020)   [place]    Feeling of Stress : Not at all  Social Connections: Not on file    Tobacco Counseling Counseling given: Not Answered   Clinical Intake:  Pre-visit preparation completed: Yes  Pain : No/denies pain     Nutritional Risks: None Diabetes: Yes CBG done?: No Did pt. bring in CBG monitor from home?: No  How often do you need to have someone help you when you read instructions, pamphlets, or other written materials from your doctor or pharmacy?: 1 - Never  Diabetic?yes Nutrition Risk Assessment:  Has the patient had any N/V/D within the last 2 months?  No  Does the patient have any non-healing wounds?  No  Has the patient had any unintentional weight loss or weight gain?  No   Diabetes:  Is the patient diabetic?  Yes  If diabetic, was a CBG obtained today?  No  Did the patient bring in their glucometer from home?  No  How often do you monitor your CBG's? 2-3 times per day.   Financial Strains and Diabetes Management:  Are you having any financial strains with the device, your supplies or your medication? No .  Does the patient want to be seen by Chronic Care  Management for management of their diabetes?  No  Would the patient like to be referred to a Nutritionist or for Diabetic Management?  No   Diabetic Exams:  Diabetic Eye Exam: Completed 07/04/20, has appointment coming up in June . Marland Kitchen Pt has been advised about the importance in completing this exam.   Diabetic Foot Exam: Completed 01/07/21. Pt has been advised about the importance in completing this exam.     Interpreter Needed?: No  Information entered by :: Kirke Shaggy, LPN   Activities of Daily Living     No data to display          Patient Care Team: Tonia Ghent, MD as PCP - General (Family Medicine) Jerline Pain, MD as PCP - Cardiology (Cardiology) Clent Jacks, MD as Consulting Physician (Ophthalmology) Druscilla Brownie, MD as Consulting Physician (Dermatology) Frederik Pear, MD as Consulting Physician (Orthopedic Surgery) Raynelle Bring, MD as Consulting Physician (Urology) Moss Mc, DDS as Referring Physician (Dentistry) Debbora Dus, [facility] as Pharmacist (Pharmacist)  Indicate any recent Medical Services you may have received from other than [facility] providers in the past year (date may be approximate).     Assessment:   This is a routine wellness examination for Saifullah.  Hearing/Vision screen No results found.  Dietary issues and exercise activities discussed:     Goals Addressed   None    Depression Screen    07/18/2020   10:31 AM 01/03/2020   11:10 AM 10/25/2018   10:30 AM 12/25/2017   10:59 AM 12/17/2016    9:39 AM 12/17/2015   10:47 AM 06/18/2012   10:00 AM  PHQ 2/9 Scores  PHQ - 2 Score 0 0 0 0 0 0 1  PHQ- 9 Score 0   0 0      Fall Risk    07/18/2020   10:31 AM 01/03/2020   11:10 AM 10/25/2018   10:30 AM 12/25/2017   10:59 AM 12/17/2016    9:39 AM  Fall Risk   Falls in the past year? 0 0 0 0 No  Number falls in past yr: 0 0     Injury with Fall? 0 0     Risk for fall due to : Medication side effect      Follow up Falls evaluation  completed;Falls prevention discussed Falls evaluation completed       FALL RISK PREVENTION PERTAINING TO THE HOME:  Any stairs in or around the home? No  If so, are there any without handrails? No  Home free of loose throw rugs in walkways, pet beds, electrical cords, etc? Yes  Adequate lighting in your home to reduce risk of falls? Yes   ASSISTIVE DEVICES UTILIZED TO PREVENT FALLS:  Life alert? No  Use of a cane, walker or w/c? Yes  Grab bars in the bathroom? Yes  Shower chair or bench in shower? Yes  Elevated toilet seat or a handicapped toilet? Yes    Cognitive Function:refused to test      07/18/2020   10:34 AM 12/25/2017   11:01 AM 12/17/2016    9:40 AM 12/17/2015   11:01 AM 05/04/2014   10:14 AM  MMSE - Mini Mental State Exam  Orientation to time 5 5 5 5 4   Orientation to Place 5 5 5 5 5   Registration 3 3 3  3  3  Attention/ Calculation 5 0 0 0 0  Recall 3 3 3 2 3   Recall-comments    pt was unable to recall 1 of 3 words   Language- name 2 objects  0 0 0 2  Language- repeat 1 1 1 1 1   Language- follow 3 step command  3 2 3 3   Language- follow 3 step command-comments   unable to follow 1 step of 3 step command    Language- read & follow direction  0 0 0 1  Write a sentence  0 0 0 1  Copy design  0 0 0 0  Total score  20 19 19 23         Immunizations Immunization History  Administered Date(s) Administered   Fluad Quad(high Dose 65+) 10/25/2018, 01/03/2020, 01/07/2021   Influenza Split 11/30/2010, 11/24/2011   Influenza Whole 03/27/2009   Influenza, High Dose Seasonal PF 12/05/2014, 12/17/2016, 12/25/2017   Influenza,inj,Quad PF,6+ Mos 12/29/2012   Influenza-Unspecified 11/10/2013   Moderna Sars-Covid-2 Vaccination 04/21/2019, 05/23/2019, 12/20/2019, 06/07/2020   Pneumococcal Conjugate-13 11/10/2013   Pneumococcal Polysaccharide-23 09/06/2004   Td 02/12/2009   Tdap 10/09/2011, 08/15/2014   Zoster, Live 07/28/2013    TDAP status: Up to date  Flu Vaccine  status: Up to date  Pneumococcal vaccine status: Up to date  Covid-19 vaccine status: Completed vaccines  Qualifies for Shingles Vaccine? Yes   Zostavax completed Yes   Shingrix Completed?: No.    Education has been provided regarding the importance of this vaccine. Patient has been advised to call insurance company to determine out of pocket expense if they have not yet received this vaccine. Advised may also receive vaccine at local pharmacy or Health Dept. Verbalized acceptance and understanding.  Screening Tests Health Maintenance  Topic Date Due   Zoster Vaccines- Shingrix (1 of 2) Never done   COVID-19 Vaccine (5 - Booster for Moderna series) 08/02/2020   OPHTHALMOLOGY EXAM  07/04/2021   HEMOGLOBIN A1C  07/07/2021   INFLUENZA VACCINE  09/10/2021   FOOT EXAM  01/07/2022   TETANUS/TDAP  08/14/2024   Pneumonia Vaccine 68+ Years old  Completed   HPV VACCINES  Aged Out    Health Maintenance  Health Maintenance Due  Topic Date Due   Zoster Vaccines- Shingrix (1 of 2) Never done   COVID-19 Vaccine (5 - Booster for Moderna series) 08/02/2020   OPHTHALMOLOGY EXAM  07/04/2021   HEMOGLOBIN A1C  07/07/2021    Colorectal cancer screening: No longer required.   Lung Cancer Screening: (Low Dose CT Chest recommended if Age 35-80 years, 30 pack-year currently smoking OR have quit w/in 15years.) does not qualify.   Additional Screening:  Hepatitis C Screening: does not qualify; Completed no  Vision Screening: Recommended annual ophthalmology exams for early detection of glaucoma and other disorders of the eye. Is the patient up to date with their annual eye exam?  Yes  Who is the provider or what is the name of the office in which the patient attends annual eye exams? Dr.Groat If pt is not established with a provider, would they like to be referred to a provider to establish care? No .   Dental Screening: Recommended annual dental exams for proper oral hygiene  Community Resource  Referral / Chronic Care Management: CRR required this visit?  No   CCM required this visit?  No      Plan:     I have personally reviewed and noted the following in the patient's chart:  Medical and social history Use of alcohol, tobacco or illicit drugs  Current medications and supplements including opioid prescriptions. Patient is not currently taking opioid prescriptions. Functional ability and status Nutritional status Physical activity Advanced directives List of other physicians Hospitalizations, surgeries, and ER visits in previous 12 months Vitals Screenings to include cognitive, depression, and falls Referrals and appointments  In addition, I have reviewed and discussed with patient certain preventive protocols, quality metrics, and best practice recommendations. A written personalized care plan for preventive services as well as general preventive health recommendations were provided to patient.     Dionisio David, LPN   03/19/6145   Nurse Notes: none

## 2021-07-19 NOTE — Patient Instructions (Signed)
[name] , Thank you for taking time to come for your Medicare Wellness Visit. I appreciate your ongoing commitment to your health goals. Please review the following plan we discussed and let me know if I can assist you in the future.   Screening recommendations/referrals: Colonoscopy: aged out Recommended yearly ophthalmology/optometry visit for glaucoma screening and checkup Recommended yearly dental visit for hygiene and checkup  Vaccinations: Influenza vaccine: 01/07/21 Pneumococcal vaccine: 11/10/13 Tdap vaccine: 08/15/14 Shingles vaccine: Zostavax 07/28/13   Covid-19: 04/21/19, 05/23/19, 12/20/19, 06/07/20  Advanced directives: no  Conditions/risks identified: none  Next appointment: Follow up in one year for your annual wellness visit. 07/22/22 @ 1:15 pm by phone  Preventive Care 85 Years and Older, Male Preventive care refers to lifestyle choices and visits with your health care provider that can promote health and wellness. What does preventive care include? A yearly physical exam. This is also called an annual well check. Dental exams once or twice a year. Routine eye exams. Ask your health care provider how often you should have your eyes checked. Personal lifestyle choices, including: Daily care of your teeth and gums. Regular physical activity. Eating a healthy diet. Avoiding tobacco and drug use. Limiting alcohol use. Practicing safe sex. Taking low doses of aspirin every day. Taking vitamin and mineral supplements as recommended by your health care provider. What happens during an annual well check? The services and screenings done by your health care provider during your annual well check will depend on your age, overall health, lifestyle risk factors, and family history of disease. Counseling  Your health care provider may ask you questions about your: Alcohol use. Tobacco use. Drug use. Emotional well-being. Home and relationship well-being. Sexual  activity. Eating habits. History of falls. Memory and ability to understand (cognition). Work and work Statistician. Screening  You may have the following tests or measurements: Height, weight, and BMI. Blood pressure. Lipid and cholesterol levels. These may be checked every 5 years, or more frequently if you are over 12 years old. Skin check. Lung cancer screening. You may have this screening every year starting at age 13 if you have a 30-pack-year history of smoking and currently smoke or have quit within the past 15 years. Fecal occult blood test (FOBT) of the stool. You may have this test every year starting at age 29. Flexible sigmoidoscopy or colonoscopy. You may have a sigmoidoscopy every 5 years or a colonoscopy every 10 years starting at age 37. Prostate cancer screening. Recommendations will vary depending on your family history and other risks. Hepatitis C blood test. Hepatitis B blood test. Sexually transmitted disease (STD) testing. Diabetes screening. This is done by checking your blood sugar (glucose) after you have not eaten for a while (fasting). You may have this done every 1-3 years. Abdominal aortic aneurysm (AAA) screening. You may need this if you are a current or former smoker. Osteoporosis. You may be screened starting at age 44 if you are at high risk. Talk with your health care provider about your test results, treatment options, and if necessary, the need for more tests. Vaccines  Your health care provider may recommend certain vaccines, such as: Influenza vaccine. This is recommended every year. Tetanus, diphtheria, and acellular pertussis (Tdap, Td) vaccine. You may need a Td booster every 10 years. Zoster vaccine. You may need this after age 18. Pneumococcal 13-valent conjugate (PCV13) vaccine. One dose is recommended after age 70. Pneumococcal polysaccharide (PPSV23) vaccine. One dose is recommended after age 29. Talk to  your health care provider about which  screenings and vaccines you need and how often you need them. This information is not intended to replace advice given to you by your health care provider. Make sure you discuss any questions you have with your health care provider. Document Released: 02/23/2015 Document Revised: 10/17/2015 Document Reviewed: 11/28/2014 Elsevier Interactive Patient Education  2017 [place] Prevention in the Home Falls can cause injuries. They can happen to people of all ages. There are many things you can do to make your home safe and to help prevent falls. What can I do on the outside of my home? Regularly fix the edges of walkways and driveways and fix any cracks. Remove anything that might make you trip as you walk through a door, such as a raised step or threshold. Trim any bushes or trees on the path to your home. Use bright outdoor lighting. Clear any walking paths of anything that might make someone trip, such as rocks or tools. Regularly check to see if handrails are loose or broken. Make sure that both sides of any steps have handrails. Any raised decks and porches should have guardrails on the edges. Have any leaves, snow, or ice cleared regularly. Use sand or salt on walking paths during winter. Clean up any spills in your garage right away. This includes oil or grease spills. What can I do in the bathroom? Use night lights. Install grab bars by the toilet and in the tub and shower. Do not use towel bars as grab bars. Use non-skid mats or decals in the tub or shower. If you need to sit down in the shower, use a plastic, non-slip stool. Keep the floor dry. Clean up any water that spills on the floor as soon as it happens. Remove soap buildup in the tub or shower regularly. Attach bath mats securely with double-sided non-slip rug tape. Do not have throw rugs and other things on the floor that can make you trip. What can I do in the bedroom? Use night lights. Make sure that you have a  light by your bed that is easy to reach. Do not use any sheets or blankets that are too big for your bed. They should not hang down onto the floor. Have a firm chair that has side arms. You can use this for support while you get dressed. Do not have throw rugs and other things on the floor that can make you trip. What can I do in the kitchen? Clean up any spills right away. Avoid walking on wet floors. Keep items that you use a lot in easy-to-reach places. If you need to reach something above you, use a strong step stool that has a grab bar. Keep electrical cords out of the way. Do not use floor polish or wax that makes floors slippery. If you must use wax, use non-skid floor wax. Do not have throw rugs and other things on the floor that can make you trip. What can I do with my stairs? Do not leave any items on the stairs. Make sure that there are handrails on both sides of the stairs and use them. Fix handrails that are broken or loose. Make sure that handrails are as long as the stairways. Check any carpeting to make sure that it is firmly attached to the stairs. Fix any carpet that is loose or worn. Avoid having throw rugs at the top or bottom of the stairs. If you do have throw rugs, attach  them to the floor with carpet tape. Make sure that you have a light switch at the top of the stairs and the bottom of the stairs. If you do not have them, ask someone to add them for you. What else can I do to help prevent falls? Wear shoes that: Do not have high heels. Have rubber bottoms. Are comfortable and fit you well. Are closed at the toe. Do not wear sandals. If you use a stepladder: Make sure that it is fully opened. Do not climb a closed stepladder. Make sure that both sides of the stepladder are locked into place. Ask someone to hold it for you, if possible. Clearly mark and make sure that you can see: Any grab bars or handrails. First and last steps. Where the edge of each step  is. Use tools that help you move around (mobility aids) if they are needed. These include: Canes. Walkers. Scooters. Crutches. Turn on the lights when you go into a dark area. Replace any light bulbs as soon as they burn out. Set up your furniture so you have a clear path. Avoid moving your furniture around. If any of your floors are uneven, fix them. If there are any pets around you, be aware of where they are. Review your medicines with your doctor. Some medicines can make you feel dizzy. This can increase your chance of falling. Ask your doctor what other things that you can do to help prevent falls. This information is not intended to replace advice given to you by your health care provider. Make sure you discuss any questions you have with your health care provider. Document Released: 11/23/2008 Document Revised: 07/05/2015 Document Reviewed: 03/03/2014 Elsevier Interactive Patient Education  2017 [employer].

## 2021-07-22 ENCOUNTER — Other Ambulatory Visit (INDEPENDENT_AMBULATORY_CARE_PROVIDER_SITE_OTHER): Payer: Medicare Other

## 2021-07-22 DIAGNOSIS — E1159 Type 2 diabetes mellitus with other circulatory complications: Secondary | ICD-10-CM | POA: Diagnosis not present

## 2021-07-22 LAB — COMPREHENSIVE METABOLIC PANEL
ALT: 13 U/L (ref 0–53)
AST: 13 U/L (ref 0–37)
Albumin: 4.1 g/dL (ref 3.5–5.2)
Alkaline Phosphatase: 72 U/L (ref 39–117)
BUN: 17 mg/dL (ref 6–23)
CO2: 31 mEq/L (ref 19–32)
Calcium: 10 mg/dL (ref 8.4–10.5)
Chloride: 102 mEq/L (ref 96–112)
Creatinine, Ser: 1.15 mg/dL (ref 0.40–1.50)
GFR: 58.41 mL/min — ABNORMAL LOW (ref >60.00)
Glucose, Bld: 77 mg/dL (ref 70–99)
Potassium: 3.7 mEq/L (ref 3.5–5.1)
Sodium: 142 mEq/L (ref 135–145)
Total Bilirubin: 0.5 mg/dL (ref 0.2–1.2)
Total Protein: 6.9 g/dL (ref 6.0–8.3)

## 2021-07-22 LAB — VITAMIN B12: Vitamin B-12: 281 pg/mL (ref 211–911)

## 2021-07-22 LAB — HEMOGLOBIN A1C: Hgb A1c MFr Bld: 8.4 % — ABNORMAL HIGH (ref 4.6–6.5)

## 2021-07-26 ENCOUNTER — Encounter: Payer: Self-pay | Admitting: Family Medicine

## 2021-07-26 ENCOUNTER — Ambulatory Visit (INDEPENDENT_AMBULATORY_CARE_PROVIDER_SITE_OTHER): Payer: Medicare Other | Admitting: Family Medicine

## 2021-07-26 VITALS — BP 140/70 | HR 83 | Temp 97.8°F | Ht 72.0 in | Wt 221.0 lb

## 2021-07-26 DIAGNOSIS — I10 Essential (primary) hypertension: Secondary | ICD-10-CM

## 2021-07-26 DIAGNOSIS — M170 Bilateral primary osteoarthritis of knee: Secondary | ICD-10-CM | POA: Diagnosis not present

## 2021-07-26 DIAGNOSIS — Z0000 Encounter for general adult medical examination without abnormal findings: Secondary | ICD-10-CM

## 2021-07-26 DIAGNOSIS — G720 Drug-induced myopathy: Secondary | ICD-10-CM

## 2021-07-26 DIAGNOSIS — E538 Deficiency of other specified B group vitamins: Secondary | ICD-10-CM | POA: Diagnosis not present

## 2021-07-26 DIAGNOSIS — E1159 Type 2 diabetes mellitus with other circulatory complications: Secondary | ICD-10-CM

## 2021-07-26 DIAGNOSIS — Z7189 Other specified counseling: Secondary | ICD-10-CM

## 2021-07-26 MED ORDER — TRAMADOL HCL 50 MG PO TABS: ORAL_TABLET | ORAL | Status: DC

## 2021-07-26 NOTE — Patient Instructions (Signed)
See if 1.5 tabs of tramadol helps with pain at all.  Update me as needed.  Take care.  Glad to see you. Plan on recheck in about 6 months, labs ahead of time if possible.

## 2021-07-26 NOTE — Progress Notes (Unsigned)
Diabetes:  Using medications without difficulties: yes Hypoglycemic episodes: no Hyperglycemic episodes:no Feet problems: no Blood Sugars averaging: 126 this AM.  Usually 120-180s on home checks eye exam within last year: pending.   Statin intolerant.  Discussed.   Goal A1c ~8 to limit lows.    B12 def on replacement, compliant.  Had a trough level drawn.   Hypertension:    Using medication without problems or lightheadedness: yes Chest pain with exertion:no Edema:no Short of breath:no Labs d/w pt.    Taking tramadol for pain, joint pain.  It isn't helping as much as prior.  No ADE on med.  B hip pain is worse in the meantime.     Flu vaccine - prev done.  covid vaccine prev done.  Shingles d/w pt.   PNA and Tetanus up to date.  PSA and colon CA screening declined due to age.   Living will d/w pt. Would have son designated first, then if needed sister Tamela Oddi, if he were incapacitated.  Lives at home.  Still independent.  No falls- cautions d/w pt.  D/w pt about routine safety.    PMH and SH reviewed  Meds, vitals, and allergies reviewed.   ROS: Per HPI unless specifically indicated in ROS section   GEN: nad, alert and oriented HEENT: ncat NECK: supple w/o LA CV: rrr. PULM: ctab, no inc wob ABD: soft, +bs EXT: no edema SKIN: no acute rash B knee crepitus on ROM.    Diabetic foot exam: Normal inspection No skin breakdown No calluses  Normal DP pulses Normal sensation to light touch and monofilament Nails thickened.

## 2021-07-28 DIAGNOSIS — G720 Drug-induced myopathy: Secondary | ICD-10-CM | POA: Insufficient documentation

## 2021-07-28 NOTE — Assessment & Plan Note (Signed)
Statin intolerant 

## 2021-07-28 NOTE — Assessment & Plan Note (Signed)
He can try taking 1.5 tramadol tabs per dose and see if that helps.  Sedation caution discussed with patient.

## 2021-07-28 NOTE — Assessment & Plan Note (Signed)
Living will d/w pt. Would have son designated first, then if needed sister [name], if he were incapacitated.

## 2021-07-28 NOTE — Assessment & Plan Note (Addendum)
Statin intolerant.  Discussed.   Goal A1c ~8 to limit lows.   A1c reasonable as is.  Continue [name] with sliding scale insulin and glimepiride.  See after visit summary.

## 2021-07-28 NOTE — Assessment & Plan Note (Signed)
Flu vaccine - prev done.  covid vaccine prev done.  Shingles d/w pt.   PNA and Tetanus up to date.  PSA and colon CA screening declined due to age.   Living will d/w pt. Would have son designated first, then if needed sister Tamela Oddi, if he were incapacitated.  Lives at home.  Still independent.  No falls- cautions d/w pt.  D/w pt about routine safety.

## 2021-07-28 NOTE — Assessment & Plan Note (Signed)
Continue amlodipine carvedilol lisinopril hydrochlorothiazide and potassium.

## 2021-07-28 NOTE — Assessment & Plan Note (Signed)
Low normal B12.  Trough level.  Would continue with replacement.  Compliant.  Discussed.

## 2021-08-07 DIAGNOSIS — H2512 Age-related nuclear cataract, left eye: Secondary | ICD-10-CM | POA: Diagnosis not present

## 2021-08-07 DIAGNOSIS — H40013 Open angle with borderline findings, low risk, bilateral: Secondary | ICD-10-CM | POA: Diagnosis not present

## 2021-08-07 DIAGNOSIS — E119 Type 2 diabetes mellitus without complications: Secondary | ICD-10-CM | POA: Diagnosis not present

## 2021-08-07 DIAGNOSIS — Z961 Presence of intraocular lens: Secondary | ICD-10-CM | POA: Diagnosis not present

## 2021-08-07 LAB — HM DIABETES EYE EXAM

## 2021-08-27 DIAGNOSIS — M170 Bilateral primary osteoarthritis of knee: Secondary | ICD-10-CM | POA: Diagnosis not present

## 2021-08-27 DIAGNOSIS — M1711 Unilateral primary osteoarthritis, right knee: Secondary | ICD-10-CM | POA: Diagnosis not present

## 2021-08-27 DIAGNOSIS — M1712 Unilateral primary osteoarthritis, left knee: Secondary | ICD-10-CM | POA: Diagnosis not present

## 2021-09-15 ENCOUNTER — Other Ambulatory Visit: Payer: Self-pay | Admitting: Family Medicine

## 2021-10-15 ENCOUNTER — Telehealth: Payer: Self-pay | Admitting: Family Medicine

## 2021-10-15 MED ORDER — GLIMEPIRIDE 4 MG PO TABS: ORAL_TABLET | ORAL | 2 refills | Status: DC

## 2021-10-15 NOTE — Telephone Encounter (Signed)
  Encourage patient to contact the pharmacy for refills or they can request refills through [facility]  Did the patient contact the pharmacy: y    LAST APPOINTMENT DATE:  Please schedule appointment if longer than 1 year  NEXT APPOINTMENT DATE:  MEDICATION:glimepiride (AMARYL) 4 MG tablet  Is the patient out of medication? y  If not, how much is left?  Is this a 90 day supply:   PHARMACY: [facility], [place] Phone:  [phone]  Fax:  [phone]     Son stated that [facility] did not deliver this medication.Would like to know if Dr Damita Dunnings can call in this rx for him?   Let patient know to contact pharmacy at the end of the day to make sure medication is ready.  Please notify patient to allow 48-72 hours to process

## 2021-10-15 NOTE — Telephone Encounter (Signed)
Rx sent 

## 2021-12-09 ENCOUNTER — Other Ambulatory Visit: Payer: Self-pay | Admitting: Family Medicine

## 2021-12-09 NOTE — Telephone Encounter (Signed)
Refill request for TRAMADOL HCL 50 MG TABS 50 Tablet  LOV - 07/26/21 Next OV - 01/27/22 Last refill - 07/26/21

## 2021-12-15 ENCOUNTER — Other Ambulatory Visit: Payer: Self-pay | Admitting: Family Medicine

## 2021-12-26 ENCOUNTER — Other Ambulatory Visit: Payer: Self-pay

## 2021-12-26 MED ORDER — TOUJEO SOLOSTAR 300 UNIT/ML ~~LOC~~ SOPN: PEN_INJECTOR | SUBCUTANEOUS | 3 refills | Status: DC

## 2022-01-27 ENCOUNTER — Ambulatory Visit (INDEPENDENT_AMBULATORY_CARE_PROVIDER_SITE_OTHER): Payer: Medicare Other | Admitting: Family Medicine

## 2022-01-27 ENCOUNTER — Encounter: Payer: Self-pay | Admitting: Family Medicine

## 2022-01-27 VITALS — BP 142/60 | HR 78 | Temp 97.9°F | Ht 72.0 in | Wt 222.0 lb

## 2022-01-27 DIAGNOSIS — E1159 Type 2 diabetes mellitus with other circulatory complications: Secondary | ICD-10-CM | POA: Diagnosis not present

## 2022-01-27 DIAGNOSIS — M159 Polyosteoarthritis, unspecified: Secondary | ICD-10-CM

## 2022-01-27 DIAGNOSIS — E538 Deficiency of other specified B group vitamins: Secondary | ICD-10-CM | POA: Diagnosis not present

## 2022-01-27 LAB — CBC WITH DIFFERENTIAL/PLATELET
Basophils Absolute: 0 10*3/uL (ref 0.0–0.1)
Basophils Relative: 0.5 % (ref 0.0–3.0)
Eosinophils Absolute: 0.3 10*3/uL (ref 0.0–0.7)
Eosinophils Relative: 4.3 % (ref 0.0–5.0)
HCT: 45.2 % (ref 39.0–52.0)
Hemoglobin: 15.1 g/dL (ref 13.0–17.0)
Lymphocytes Relative: 29.6 % (ref 12.0–46.0)
Lymphs Abs: 2.3 10*3/uL (ref 0.7–4.0)
MCHC: 33.5 g/dL (ref 30.0–36.0)
MCV: 93.1 fl (ref 78.0–100.0)
Monocytes Absolute: 0.7 10*3/uL (ref 0.1–1.0)
Monocytes Relative: 9.1 % (ref 3.0–12.0)
Neutro Abs: 4.3 10*3/uL (ref 1.4–7.7)
Neutrophils Relative %: 56.5 % (ref 43.0–77.0)
Platelets: 326 10*3/uL (ref 150.0–400.0)
RBC: 4.85 Mil/uL (ref 4.22–5.81)
RDW: 14 % (ref 11.5–15.5)
WBC: 7.6 10*3/uL (ref 4.0–10.5)

## 2022-01-27 LAB — COMPREHENSIVE METABOLIC PANEL
ALT: 12 U/L (ref 0–53)
AST: 11 U/L (ref 0–37)
Albumin: 4.3 g/dL (ref 3.5–5.2)
Alkaline Phosphatase: 80 U/L (ref 39–117)
BUN: 18 mg/dL (ref 6–23)
CO2: 30 mEq/L (ref 19–32)
Calcium: 10.4 mg/dL (ref 8.4–10.5)
Chloride: 99 mEq/L (ref 96–112)
Creatinine, Ser: 1.23 mg/dL (ref 0.40–1.50)
GFR: 53.69 mL/min — ABNORMAL LOW (ref >60.00)
Glucose, Bld: 139 mg/dL — ABNORMAL HIGH (ref 70–99)
Potassium: 4.1 mEq/L (ref 3.5–5.1)
Sodium: 139 mEq/L (ref 135–145)
Total Bilirubin: 0.5 mg/dL (ref 0.2–1.2)
Total Protein: 6.9 g/dL (ref 6.0–8.3)

## 2022-01-27 LAB — VITAMIN B12: Vitamin B-12: 217 pg/mL (ref 211–911)

## 2022-01-27 LAB — HEMOGLOBIN A1C: Hgb A1c MFr Bld: 8.9 % — ABNORMAL HIGH (ref 4.6–6.5)

## 2022-01-27 LAB — MICROALBUMIN / CREATININE URINE RATIO
Creatinine,U: 75.7 mg/dL
Microalb Creat Ratio: 60.6 mg/g — ABNORMAL HIGH (ref 0.0–30.0)
Microalb, Ur: 45.9 mg/dL — ABNORMAL HIGH (ref 0.0–1.9)

## 2022-01-27 LAB — LIPID PANEL
Cholesterol: 159 mg/dL (ref 0–200)
HDL: 36.9 mg/dL — ABNORMAL LOW (ref >39.00)
LDL Cholesterol: 88 mg/dL (ref 0–99)
NonHDL: 122.26
Total CHOL/HDL Ratio: 4
Triglycerides: 173 mg/dL — ABNORMAL HIGH (ref 0.0–149.0)
VLDL: 34.6 mg/dL (ref 0.0–40.0)

## 2022-01-27 NOTE — Patient Instructions (Addendum)
Go to the lab on the way out.   If you have mychart we'll likely use that to update you.    Update me as needed.  Plan on recheck in about 6 months.

## 2022-01-27 NOTE — Progress Notes (Unsigned)
Diabetes:  Using medications without difficulties: yes Hypoglycemic episodes: rarely low, down to 88 Hyperglycemic episodes: none above 300.   Feet problems:no Blood Sugars averaging: 200 this AM, then down to 88.   eye exam within last year: yes Labs pending.   B12 def on replacement.    Walking with a cane.  Not lightheaded.  Balance is still affected, with kyphosis noted.  No falls unless outside on uneven ground.  Cautions discussed with patient.  Putting up with back pain and hand pain at baseline.  Tramadol isn't helping a lot.  Taking tramadol qhs, d/w pt about BID dosing.  Sedation caution d/w pt.    Meds, vitals, and allergies reviewed.   ROS: Per HPI unless specifically indicated in ROS section   GEN: nad, alert and oriented HEENT: ncat NECK: supple w/o LA CV: rrr. PULM: ctab, no inc wob ABD: soft, +bs EXT: no edema SKIN: no acute rash

## 2022-01-29 ENCOUNTER — Telehealth: Payer: Self-pay | Admitting: Family Medicine

## 2022-01-29 NOTE — Assessment & Plan Note (Signed)
Discussed trying tramadol twice a day with sedation caution and he will update me as needed.

## 2022-01-29 NOTE — Assessment & Plan Note (Signed)
Continue glimepiride, insulin, see notes on labs.

## 2022-01-29 NOTE — Assessment & Plan Note (Addendum)
See notes on labs.  Continue replacement as is.

## 2022-01-29 NOTE — Telephone Encounter (Signed)
Opened in error

## 2022-01-30 ENCOUNTER — Telehealth: Payer: Self-pay | Admitting: Family Medicine

## 2022-01-30 NOTE — Telephone Encounter (Signed)
Patient called and said he was returning a call about his results and also wanted to know if he can get them in the mail. Call back number [phone]

## 2022-01-30 NOTE — Telephone Encounter (Signed)
Spoke with patient and mail his results to him.

## 2022-02-27 ENCOUNTER — Other Ambulatory Visit: Payer: Self-pay | Admitting: Family Medicine

## 2022-03-18 ENCOUNTER — Other Ambulatory Visit: Payer: Self-pay | Admitting: Family Medicine

## 2022-03-25 ENCOUNTER — Other Ambulatory Visit: Payer: Self-pay | Admitting: Family Medicine

## 2022-03-26 NOTE — Telephone Encounter (Signed)
Refill request for TRAMADOL HCL 50 MG TABS 50 Tablet   LOV - 01/27/22 Next OV - not scheduled Last refill - 12/10/21 #60/2

## 2022-05-06 ENCOUNTER — Other Ambulatory Visit: Payer: Self-pay

## 2022-05-06 ENCOUNTER — Encounter (HOSPITAL_COMMUNITY): Payer: Self-pay

## 2022-05-06 ENCOUNTER — Emergency Department (HOSPITAL_COMMUNITY)
Admission: EM | Admit: 2022-05-06 | Discharge: 2022-05-06 | Disposition: A | Discharge status: Home / Self Care | Payer: Medicare Other | Attending: Emergency Medicine | Admitting: Emergency Medicine

## 2022-05-06 ENCOUNTER — Emergency Department (HOSPITAL_COMMUNITY): Payer: Medicare Other

## 2022-05-06 ENCOUNTER — Telehealth (HOSPITAL_COMMUNITY): Payer: Self-pay | Admitting: Emergency Medicine

## 2022-05-06 DIAGNOSIS — I10 Essential (primary) hypertension: Secondary | ICD-10-CM | POA: Diagnosis not present

## 2022-05-06 DIAGNOSIS — E876 Hypokalemia: Secondary | ICD-10-CM | POA: Insufficient documentation

## 2022-05-06 DIAGNOSIS — S61215A Laceration without foreign body of left ring finger without damage to nail, initial encounter: Secondary | ICD-10-CM

## 2022-05-06 DIAGNOSIS — R55 Syncope and collapse: Secondary | ICD-10-CM | POA: Diagnosis not present

## 2022-05-06 DIAGNOSIS — Z794 Long term (current) use of insulin: Secondary | ICD-10-CM | POA: Diagnosis not present

## 2022-05-06 DIAGNOSIS — Z23 Encounter for immunization: Secondary | ICD-10-CM | POA: Insufficient documentation

## 2022-05-06 DIAGNOSIS — I491 Atrial premature depolarization: Secondary | ICD-10-CM | POA: Diagnosis not present

## 2022-05-06 DIAGNOSIS — W01118A Fall on same level from slipping, tripping and stumbling with subsequent striking against other sharp object, initial encounter: Secondary | ICD-10-CM | POA: Diagnosis not present

## 2022-05-06 DIAGNOSIS — Z7982 Long term (current) use of aspirin: Secondary | ICD-10-CM | POA: Insufficient documentation

## 2022-05-06 DIAGNOSIS — R739 Hyperglycemia, unspecified: Secondary | ICD-10-CM | POA: Diagnosis not present

## 2022-05-06 DIAGNOSIS — S0990XA Unspecified injury of head, initial encounter: Secondary | ICD-10-CM | POA: Diagnosis not present

## 2022-05-06 DIAGNOSIS — R58 Hemorrhage, not elsewhere classified: Secondary | ICD-10-CM | POA: Diagnosis not present

## 2022-05-06 DIAGNOSIS — W19XXXA Unspecified fall, initial encounter: Secondary | ICD-10-CM

## 2022-05-06 DIAGNOSIS — S199XXA Unspecified injury of neck, initial encounter: Secondary | ICD-10-CM | POA: Diagnosis not present

## 2022-05-06 DIAGNOSIS — R42 Dizziness and giddiness: Secondary | ICD-10-CM | POA: Diagnosis not present

## 2022-05-06 LAB — URINALYSIS, ROUTINE W REFLEX MICROSCOPIC
Bilirubin Urine: NEGATIVE
Glucose, UA: 150 mg/dL — AB
Hgb urine dipstick: NEGATIVE
Ketones, ur: NEGATIVE mg/dL
Nitrite: POSITIVE — AB
Protein, ur: 100 mg/dL — AB
Specific Gravity, Urine: 1.015 (ref 1.005–1.030)
pH: 6 (ref 5.0–8.0)

## 2022-05-06 LAB — BASIC METABOLIC PANEL
Anion gap: 11 (ref 5–15)
BUN: 22 mg/dL (ref 8–23)
CO2: 24 mmol/L (ref 22–32)
Calcium: 9.3 mg/dL (ref 8.9–10.3)
Chloride: 100 mmol/L (ref 98–111)
Creatinine, Ser: 1.17 mg/dL (ref 0.61–1.24)
GFR, Estimated: >60 mL/min (ref >60)
Glucose, Bld: 285 mg/dL — ABNORMAL HIGH (ref 70–99)
Potassium: 3.2 mmol/L — ABNORMAL LOW (ref 3.5–5.1)
Sodium: 135 mmol/L (ref 135–145)

## 2022-05-06 LAB — CBC WITH DIFFERENTIAL/PLATELET
Abs Immature Granulocytes: 0.04 10*3/uL (ref 0.00–0.07)
Basophils Absolute: 0.1 10*3/uL (ref 0.0–0.1)
Basophils Relative: 1 %
Eosinophils Absolute: 0.3 10*3/uL (ref 0.0–0.5)
Eosinophils Relative: 3 %
HCT: 46.6 % (ref 39.0–52.0)
Hemoglobin: 15.7 g/dL (ref 13.0–17.0)
Immature Granulocytes: 1 %
Lymphocytes Relative: 24 %
Lymphs Abs: 2.1 10*3/uL (ref 0.7–4.0)
MCH: 31.2 pg (ref 26.0–34.0)
MCHC: 33.7 g/dL (ref 30.0–36.0)
MCV: 92.6 fL (ref 80.0–100.0)
Monocytes Absolute: 0.7 10*3/uL (ref 0.1–1.0)
Monocytes Relative: 8 %
Neutro Abs: 5.6 10*3/uL (ref 1.7–7.7)
Neutrophils Relative %: 63 %
Platelets: 306 10*3/uL (ref 150–400)
RBC: 5.03 MIL/uL (ref 4.22–5.81)
RDW: 13.8 % (ref 11.5–15.5)
WBC: 8.7 10*3/uL (ref 4.0–10.5)
nRBC: 0 % (ref 0.0–0.2)

## 2022-05-06 LAB — CBG MONITORING, ED: Glucose-Capillary: 277 mg/dL — ABNORMAL HIGH (ref 70–99)

## 2022-05-06 MED ORDER — LIDOCAINE HCL (PF) 1 % IJ SOLN
5.0000 mL | Freq: Once | INTRAMUSCULAR | Status: AC
  Administered 2022-05-06: 5 mL
  Filled 2022-05-06: qty 30

## 2022-05-06 MED ORDER — POTASSIUM CHLORIDE CRYS ER 20 MEQ PO TBCR
40.0000 meq | EXTENDED_RELEASE_TABLET | Freq: Once | ORAL | Status: AC
  Administered 2022-05-06: 40 meq via ORAL
  Filled 2022-05-06: qty 2

## 2022-05-06 MED ORDER — CEPHALEXIN 500 MG PO CAPS: 500.0000 mg | ORAL_CAPSULE | Freq: Four times a day (QID) | ORAL | 0 refills | Status: AC

## 2022-05-06 MED ORDER — ACETAMINOPHEN 325 MG PO TABS: 650.0000 mg | ORAL_TABLET | Freq: Once | ORAL | Status: DC

## 2022-05-06 MED ORDER — IBUPROFEN 200 MG PO TABS
600.0000 mg | ORAL_TABLET | Freq: Once | ORAL | Status: AC
  Administered 2022-05-06: 600 mg via ORAL
  Filled 2022-05-06: qty 3

## 2022-05-06 MED ORDER — TETANUS-DIPHTH-ACELL PERTUSSIS 5-2.5-18.5 LF-MCG/0.5 IM SUSY
0.5000 mL | PREFILLED_SYRINGE | Freq: Once | INTRAMUSCULAR | Status: AC
  Administered 2022-05-06: 0.5 mL via INTRAMUSCULAR
  Filled 2022-05-06: qty 0.5

## 2022-05-06 NOTE — ED Notes (Signed)
Pt ambulated down hallway and back to room with his walker, which he always uses without any difficulty

## 2022-05-06 NOTE — Discharge Instructions (Addendum)
It was a pleasure taking care of you today!   Your potassium was slightly low today in the ED, it was replenished with potassium in the ED. Your imaging didn't show any concerning emergent findings today. You may return to your primary care provider, urgent care, or return to the emergency department for suture removal in 7-10 days.  Keep the area clean and dry. The glue will dissolve on its own. Below is more information on the care of your laceration that was repaired with glue.   You may take a shower or a bath, but try to keep the wound dry. Do not soak the wound in water. After you take a shower or a bath, pat the wound dry with a clean towel. Do not rub the wound. Do not do any activities that will make you sweat a lot until the skin glue has fallen off. Do not apply liquid, cream, or ointment medicine to your wound while the skin glue is still on. If a bandage is placed over the wound, do not put tape right on top of the skin glue. Do not pick at the glue. The skin glue usually stays on for 5-10 days. Then, it falls off the skin.  Return to the emergency department if worsening or persistent pain, drainage of wound, increased swelling, or color change to area.

## 2022-05-06 NOTE — Telephone Encounter (Signed)
7:10 PM -spoke with patient regarding UTI findings on urine.  Discussed with patient that we will send home a prescription for antibiotic.  Instructed patient to follow-up with his primary care provider and return to the emergency department if his symptoms worsen.  Patient acknowledges and verbalized understanding.

## 2022-05-06 NOTE — ED Provider Notes (Signed)
[place] EMERGENCY DEPARTMENT AT [facility] Provider Note   CSN: [id number] Arrival date & time: 05/06/22  1414     History  Chief Complaint  Patient presents with   [name] is a 88 y.o. male who presents to the ED with concerns for fall onset PTA. Notes that he was reaching when he had a [place] mechanism of injury and cut his left ring finger on the blade of his lawn mower. Pt unsure of tetanus status. Pt notes that he woke up on the ground. This was an unwitnessed fall.  Pt at first notes that he is dizzy and then states that he is not. Denies chest pain, shortness of breath, dizziness, abdominal pain, nausea, vomiting, neck pain, back pain. Pt ambulate with a walker at baseline. No anticoagulants.   The history is provided by the patient. No language interpreter was used.       Home Medications Prior to Admission medications   Medication Sig Start Date End Date Taking? Authorizing Provider  Accu-Chek Softclix Lancets lancets TEST BLOOD SUGAR UP TO THREE TIMES DAILY 01/23/20   Tonia Ghent, MD  amLODipine (NORVASC) 10 MG tablet TAKE 1 TABLET BY MOUTH IN THE  MORNING 09/16/21   Tonia Ghent, MD  aspirin EC 81 MG tablet Take 81 mg by mouth every evening.    [provider]  Blood Glucose Calibration (ACCU-CHEK AVIVA) SOLN Use as directed monthly. E11.59  Insulin dependent 12/06/18   Tonia Ghent, MD  Blood Glucose Monitoring Suppl (ACCU-CHEK AVIVA PLUS) w/Device KIT Check sugar up to 3 times daily. E11.59  Insulin dependent 11/30/18   Tonia Ghent, MD  carvedilol (COREG) 12.5 MG tablet TAKE 1 TABLET BY MOUTH TWICE  DAILY WITH MEALS 09/16/21   Tonia Ghent, MD  COD LIVER OIL PO Take 1 capsule by mouth 2 (two) times daily.     [provider]  cyanocobalamin (,VITAMIN B-12,) 1000 MCG/ML injection Inject 1 mL (1,000 mcg total) into the muscle every 30 (thirty) days. Dispense 3 of the 1 mL vials. 10/29/18   Tonia Ghent, MD   DROPLET PEN NEEDLES 30G X 8 MM MISC USE  WITH  INSULIN  PEN 02/03/19   Tonia Ghent, MD  glimepiride (AMARYL) 4 MG tablet TAKE ONE-HALF TABLET BY MOUTH  DAILY WITH BREAKFAST 03/19/22   Tonia Ghent, MD  HUMALOG KWIKPEN 100 UNIT/ML KwikPen INJECT SUBCUTANEOUSLY 4-10 UNITS 3 TIMES DAILY BEFORE MEALS PER  SLIDING SCALE 151-200 4 UN,  201-250 6 UN, 251-300 8 UN, AND  OVER 300 10 UN 02/28/22   Tonia Ghent, MD  hydrALAZINE (APRESOLINE) 50 MG tablet TAKE 1 TABLET BY MOUTH 3 TIMES  DAILY 09/16/21   Tonia Ghent, MD  insulin glargine, 1 Unit Dial, ([name] SOLOSTAR) 300 UNIT/ML Solostar Pen INJECT SUBCUTANEOUSLY 12  UNITS AT BEDTIME 12/26/21   Tonia Ghent, MD  lisinopril-hydrochlorothiazide (ZESTORETIC) 20-25 MG tablet TAKE 1 TABLET BY MOUTH IN THE  MORNING 09/16/21   Tonia Ghent, MD  [facility] ULTRA test strip CHECK UP TO 3 TIMES DAILY AS  NEEDED 12/16/21   Tonia Ghent, MD  potassium chloride SA (KLOR-CON M) 20 MEQ tablet TAKE 1 TABLET BY MOUTH DAILY 09/16/21   Tonia Ghent, MD  SYRINGE-NEEDLE, DISP, 3 ML (B-D 3CC LUER-LOK SYR 25GX1") 25G X 1" 3 ML MISC USE TO INJECT VITAMIN B12 MONTHLY. 08/22/19   Tonia Ghent, MD  tamsulosin (FLOMAX) 0.4 MG CAPS capsule TAKE 1 CAPSULE BY MOUTH DAILY  AFTER SUPPER 03/19/22   Tonia Ghent, MD  traMADol (ULTRAM) 50 MG tablet TAKE 1 TABLET BY MOUTH EVERY 12 HOURS AS NEEDED FOR KNEE PAIN. 03/26/22   Tonia Ghent, MD      Allergies    Morphine, Atorvastatin, Codeine phosphate, and Metformin and related    Review of Systems   Review of Systems  All other systems reviewed and are negative.   Physical Exam Updated Vital Signs BP (!) 192/88   Pulse 84   Temp 98.2 F (36.8 C)   Resp 20   Wt 100 kg   SpO2 97%   BMI 29.90 kg/m  Physical Exam Vitals and nursing note reviewed.  Constitutional:      General: He is not in acute distress.    Appearance: He is not diaphoretic.  HENT:     Head: Normocephalic and atraumatic.      Mouth/Throat:     Pharynx: No oropharyngeal exudate.  Eyes:     General: No scleral icterus.    Conjunctiva/sclera: Conjunctivae normal.  Cardiovascular:     Rate and Rhythm: Normal rate and regular rhythm.     Pulses: Normal pulses.     Heart sounds: Normal heart sounds.  Pulmonary:     Effort: Pulmonary effort is normal. No respiratory distress.     Breath sounds: Normal breath sounds. No wheezing.  Abdominal:     General: Bowel sounds are normal.     Palpations: Abdomen is soft. There is no mass.     Tenderness: There is no abdominal tenderness. There is no guarding or rebound.  Musculoskeletal:        General: Normal range of motion.     Cervical back: Normal range of motion and neck supple.     Comments: No spinal TTP. No TTP noted to musculature of back  Skin:    General: Skin is warm and dry.     Comments: 1.5 cm laceration noted to left distal phalanx. Able to flex and extend against resistance.   Neurological:     Mental Status: He is alert.  Psychiatric:        Behavior: Behavior normal.     ED Results / Procedures / Treatments   Labs (all labs ordered are listed, but only abnormal results are displayed) Labs Reviewed  BASIC METABOLIC PANEL - Abnormal; Notable for the following components:      Result Value   Potassium 3.2 (*)    Glucose, Bld 285 (*)    All other components within normal limits  CBG MONITORING, ED - Abnormal; Notable for the following components:   Glucose-Capillary 277 (*)    All other components within normal limits  CBC WITH DIFFERENTIAL/PLATELET  URINALYSIS, ROUTINE W REFLEX MICROSCOPIC    EKG EKG Interpretation  Date/Time:  Tuesday May 06 2022 15:56:18 EDT Ventricular Rate:  85 PR Interval:  197 QRS Duration: 119 QT Interval:  395 QTC Calculation: 470 R Axis:   9 Text Interpretation: Sinus rhythm Multiform ventricular premature complexes Nonspecific intraventricular conduction delay Low voltage, precordial leads Probable  anteroseptal infarct, old Nonspecific T abnormalities, lateral leads Baseline wander in lead(s) V1 No significant change since last tracing Confirmed by Blanchie Dessert [phone]) on 05/06/2022 4:03:42 PM  Radiology CT Head Wo Contrast  Result Date: 05/06/2022 CLINICAL DATA:  Minor head trauma EXAM: CT HEAD WITHOUT CONTRAST CT CERVICAL SPINE WITHOUT CONTRAST TECHNIQUE: Multidetector CT imaging of the  head and cervical spine was performed following the standard protocol without intravenous contrast. Multiplanar CT image reconstructions of the cervical spine were also generated. RADIATION DOSE REDUCTION: This exam was performed according to the departmental dose-optimization program which includes automated exposure control, adjustment of the mA and/or kV according to patient size and/or use of iterative reconstruction technique. COMPARISON:  01/28/2014 head CT FINDINGS: CT HEAD FINDINGS Brain: No evidence of acute infarction, hemorrhage, hydrocephalus, extra-axial collection or mass lesion/mass effect. Generalized cerebral volume loss. Vascular: No hyperdense vessel or unexpected calcification. Skull: Normal. Negative for fracture or focal lesion. Sinuses/Orbits: Unremarkable CT CERVICAL SPINE FINDINGS Alignment: No traumatic malalignment. Skull base and vertebrae: No acute fracture Soft tissues and spinal canal: No evidence of soft tissue injury. Disc levels: Bridging osteophytes span C4 to T2. C2-3 facet ankylosis. Degeneration to a notable degree at the atlanto dental interval. Upper chest: Clear apical lungs IMPRESSION: No evidence of acute intracranial or cervical spine injury. Electronically Signed   By: Jorje Guild M.D.   On: 05/06/2022 15:59   CT Cervical Spine Wo Contrast  Result Date: 05/06/2022 CLINICAL DATA:  Minor head trauma EXAM: CT HEAD WITHOUT CONTRAST CT CERVICAL SPINE WITHOUT CONTRAST TECHNIQUE: Multidetector CT imaging of the head and cervical spine was performed following the standard  protocol without intravenous contrast. Multiplanar CT image reconstructions of the cervical spine were also generated. RADIATION DOSE REDUCTION: This exam was performed according to the departmental dose-optimization program which includes automated exposure control, adjustment of the mA and/or kV according to patient size and/or use of iterative reconstruction technique. COMPARISON:  01/28/2014 head CT FINDINGS: CT HEAD FINDINGS Brain: No evidence of acute infarction, hemorrhage, hydrocephalus, extra-axial collection or mass lesion/mass effect. Generalized cerebral volume loss. Vascular: No hyperdense vessel or unexpected calcification. Skull: Normal. Negative for fracture or focal lesion. Sinuses/Orbits: Unremarkable CT CERVICAL SPINE FINDINGS Alignment: No traumatic malalignment. Skull base and vertebrae: No acute fracture Soft tissues and spinal canal: No evidence of soft tissue injury. Disc levels: Bridging osteophytes span C4 to T2. C2-3 facet ankylosis. Degeneration to a notable degree at the atlanto dental interval. Upper chest: Clear apical lungs IMPRESSION: No evidence of acute intracranial or cervical spine injury. Electronically Signed   By: Jorje Guild M.D.   On: 05/06/2022 15:59    Procedures .Marland KitchenLaceration Repair  Date/Time: 05/06/2022 5:51 PM  Performed by: Steva Colder A, PA-C Authorized by: Nehemiah Settle, PA-C   Consent:    Consent obtained:  Verbal   Consent given by:  Patient   Risks discussed:  Infection, pain and need for additional repair Universal protocol:    Patient identity confirmed:  Verbally with patient and hospital-assigned identification number Anesthesia:    Anesthesia method:  Local infiltration   Local anesthetic:  Lidocaine 1% w/o epi Laceration details:    Location:  Finger   Finger location:  L ring finger   Length (cm):  1.5 Exploration:    Hemostasis achieved with:  Direct pressure and tourniquet   Imaging outcome: foreign body not noted     Wound  exploration: entire depth of wound visualized   Treatment:    Area cleansed with:  Saline   Amount of cleaning:  Standard   Irrigation solution:  Sterile saline   Irrigation method:  Syringe Skin repair:    Repair method:  Sutures   Suture size:  5-0   Suture material:  Prolene   Suture technique:  Simple interrupted   Number of sutures:  5 Approximation:  Approximation:  Close Repair type:    Repair type:  Simple Post-procedure details:    Dressing:  Non-adherent dressing   Procedure completion:  Tolerated well, no immediate complications     Medications Ordered in ED Medications  potassium chloride SA (KLOR-CON M) CR tablet 40 mEq (has no administration in time range)  lidocaine (PF) (XYLOCAINE) 1 % injection 5 mL (5 mLs Infiltration Given 05/06/22 1707)  Tdap (BOOSTRIX) injection 0.5 mL (0.5 mLs Intramuscular Given 05/06/22 1606)    ED Course/ Medical Decision Making/ A&P                             Medical Decision Making Amount and/or Complexity of Data Reviewed Labs: ordered. Radiology: ordered.  Risk Prescription drug management.   Pt with fall occurring PTA. Unsure if he hit his head or LOC. Vital signs pt afebrile. On exam, patient with 1.5 cm laceration noted to distal phalanx of left ring finger with full flexion and extension. No acute cardiovascular, respiratory, or abdominal exam findings. Differential diagnosis includes arrhythmia, hypoglycemia, intracranial abnormality, electrolyte abnormality.   Additional history obtained:  Additional history obtained from Daughter/Son  Labs:  I ordered, and personally interpreted labs.  The pertinent results include:   CBG elevated at 277 BMP with hypokalemia at 3.2, glucose elevated at 285, otherwise unremarkable CBC unremarkable  Imaging: I ordered imaging studies including CT head and cervical spine I independently visualized and interpreted imaging which showed: no acute findings I agree with the  radiologist interpretation  Medications:  I ordered medication including tdap and potassium for vaccine prophylaxis and repletion I have reviewed the patients home medicines and have made adjustments as needed   Disposition: Presentation suspicious for fall and laceration. Doubt fracture, dislocation, herniation, intracranial abnormality, arrhythmia, electrolyte abnormality at this time. After consideration of the diagnostic results and the patients response to treatment, I feel that the patient would benefit from Discharge home.  Pt able to ambulate without assistance or difficulty with assistance of his cane that he uses at home. Pt to return to suture removal in 7-10 days. Instructed patient to follow up with PCP regarding todays visit. Supportive care measures and strict return precautions discussed with patient at bedside. Appears safe for discharge at this time. Follow up as indicated in discharge paperwork.   This chart was dictated using voice recognition software, Dragon. Despite the best efforts of this provider to proofread and correct errors, errors may still occur which can change documentation meaning.   Final Clinical Impression(s) / ED Diagnoses Final diagnoses:  Fall, initial encounter  Laceration of left ring finger w/o foreign body w/o damage to nail, initial encounter    Rx / DC Orders ED Discharge Orders     None         Nahla Lukin A, PA-C 05/06/22 Tamela Oddi, MD 05/07/22 0725

## 2022-05-06 NOTE — ED Triage Notes (Signed)
C/o fall while working on [employer], unk if hit head and LOC.  Denies blood thinner usage.  Complaint of lac to left ring finger.  Bleeding controlled in triage.

## 2022-05-07 ENCOUNTER — Telehealth: Payer: Self-pay

## 2022-05-07 NOTE — Telephone Encounter (Signed)
        Patient  visited [place] on 3/26     Telephone encounter attempt :  1st  A HIPAA compliant voice message was left requesting a return call.  Instructed patient to call back .    [facility] [phone] [address], [place], [place] [address] Phone: [phone] Email: [email or web address]

## 2022-05-08 ENCOUNTER — Telehealth: Payer: Self-pay

## 2022-05-08 NOTE — Telephone Encounter (Signed)
        Patient  visited [place] on 3/26    Telephone encounter attempt :  2nd  A HIPAA compliant voice message was left requesting a return call.  Instructed patient to call back.    [place] [phone] [address], [place], [address] [address] Phone: [phone] Email: [email or web address]

## 2022-05-19 ENCOUNTER — Telehealth: Payer: Self-pay | Admitting: Pharmacist

## 2022-05-19 DIAGNOSIS — I5042 Chronic combined systolic (congestive) and diastolic (congestive) heart failure: Secondary | ICD-10-CM

## 2022-05-19 DIAGNOSIS — I2510 Atherosclerotic heart disease of native coronary artery without angina pectoris: Secondary | ICD-10-CM

## 2022-05-19 DIAGNOSIS — E1159 Type 2 diabetes mellitus with other circulatory complications: Secondary | ICD-10-CM

## 2022-05-19 DIAGNOSIS — I10 Essential (primary) hypertension: Secondary | ICD-10-CM

## 2022-05-19 NOTE — Telephone Encounter (Signed)
PharmD reviewed patient chart to assess eligibility for [facility] services. Patient was determined to be a good candidate for the program given the complexity of the medication regimen and overall risk for hospitalization and/or high healthcare utilization.   Referral entered in order to outreach patient and offer appointment with PharmD. Referral cosigned to PCP.  

## 2022-05-23 ENCOUNTER — Telehealth: Payer: Self-pay

## 2022-05-23 NOTE — Progress Notes (Signed)
Care Management & Coordination Services Pharmacy Team  Reason for Encounter: Appointment Reminder  Contacted patient to confirm in office appointment with Al Corpus , PharmD on 05/28/22 at 2:00. Reminded location [facility] Spoke with patient on 05/23/2022   Do you have any problems getting your medications? No   What is your top health concern you would like to discuss at your upcoming visit? No concerns mentioned  Have you seen any other providers since your last visit with PCP? Yes  [place] at [facility]   Chart review:  Recent office visits:  01/27/22-Graham Duncan,MD(PCP)-f/u DM -Labs (A1c 8.9, Kidney function is stable.  Liver tests are normal.  Lipids are similar to previous.  A1c is slightly higher at 8.9.  Microalbumin is positive, meaning he is spilling some protein into his urine.  His blood counts are stable.  B12 is on the low end of normal.)Taking tramadol qhs, d/w pt about BID dosing.f/u 6 months    Recent consult visits:  None in 6 months   Hospital visits:  3/26/34[facility] ED at [facility]- fall -discharged home   Star Rating Drugs:  Medication:  Last Fill: [facility] [employer] Glimepiride 4mg  03/26/22 100 Humalog  05/19/22  100 [name]   04/29/22 113 Lisinopril/Hctz 20-25mg   04/17/22 100   Care Gaps: Annual wellness visit in last year? Yes  If Diabetic: Last eye exam / retinopathy screening:UTD Last diabetic foot exam:UTD   Al Corpus, PharmD notified  Burt Knack, [facility] Clinical Pharmacy Assistant [phone]

## 2022-05-28 ENCOUNTER — Ambulatory Visit: Payer: Medicare Other | Admitting: Pharmacist

## 2022-05-28 DIAGNOSIS — E1165 Type 2 diabetes mellitus with hyperglycemia: Secondary | ICD-10-CM

## 2022-05-28 DIAGNOSIS — Z794 Long term (current) use of insulin: Secondary | ICD-10-CM

## 2022-05-28 MED ORDER — FREESTYLE LIBRE 3 READER DEVI: 1.0000 | Status: DC

## 2022-05-28 MED ORDER — FREESTYLE LIBRE 3 READER DEVI: 1.0000 | 0 refills | Status: DC

## 2022-05-28 MED ORDER — FREESTYLE LIBRE 3 SENSOR MISC: 3 refills | Status: DC

## 2022-05-28 NOTE — Progress Notes (Unsigned)
Care Management & Coordination Services Pharmacy Note  05/28/2022 Name:  [name] MRN:  [id number] DOB:  1934/10/19  Summary: Initial OV -DM: A1c 8.9% (01/2022); on glimepiride from 10+ years, likely not doing much anymore -Frequent falls (2 in last ~6 weeks)  Recommendations/Changes made from today's visit: -Hold glimepiride -Ordered Freestyle Libre 3 sensors + reader to pharmacy; f/u 3 weeks for CGM download  Follow up plan: -Pharmacist follow up OV scheduled for 3 weeks (CGM) -    Subjective: [name] is an 88 y.o. year old male who is a primary patient of Para March, Dwana Curd, MD.  The care coordination team was consulted for assistance with disease management and care coordination needs.    Engaged with patient face to face for initial visit. Patient is accompanied by sister Tyler Aas for visit. He lives at home alone. His son lives on the same street and visits often.  Recent office visits: 01/27/22 Dr Para March OV: f/u - BP 142/60. A1c 8.9%, avoid low sugars so no change to insulin.  Recent consult visits: None  Hospital visits: 05/06/22 ED visit ([facility]): Larey Seat onto lawnmower blade. Also fall 1 week prior. Son reports bad gait for years but usually does not fall.   Objective:  Lab Results  Component Value Date   CREATININE 1.17 05/06/2022   BUN 22 05/06/2022   GFR 53.69 (L) 01/27/2022   GFRNONAA >60 05/06/2022   GFRAA >60 03/16/2018   NA 135 05/06/2022   K 3.2 (L) 05/06/2022   CALCIUM 9.3 05/06/2022   CO2 24 05/06/2022   GLUCOSE 285 (H) 05/06/2022    Lab Results  Component Value Date/Time   HGBA1C 8.9 (H) 01/27/2022 10:42 AM   HGBA1C 8.4 (H) 07/22/2021 08:20 AM   GFR 53.69 (L) 01/27/2022 10:42 AM   GFR 58.41 (L) 07/22/2021 08:20 AM   MICROALBUR 45.9 (H) 01/27/2022 10:42 AM    Last diabetic Eye exam:  Lab Results  Component Value Date/Time   HMDIABEYEEXA No Retinopathy 08/07/2021 12:00 AM    Last diabetic Foot exam: No results found for:  "HMDIABFOOTEX"   Lab Results  Component Value Date   CHOL 159 01/27/2022   HDL 36.90 (L) 01/27/2022   LDLCALC 88 01/27/2022   LDLDIRECT 93.0 12/27/2018   TRIG 173.0 (H) 01/27/2022   CHOLHDL 4 01/27/2022       Latest Ref Rng & Units 01/27/2022   10:42 AM 07/22/2021    8:20 AM 01/07/2021   11:51 AM  Hepatic Function  Total Protein 6.0 - 8.3 g/dL 6.9  6.9  7.0   Albumin 3.5 - 5.2 g/dL 4.3  4.1  4.2   AST 0 - 37 U/L ALT 0 - 53 U/L Alk Phosphatase 39 - 117 U/L 80  72  81   Total Bilirubin 0.2 - 1.2 mg/dL 0.5  0.5  0.6     Lab Results  Component Value Date/Time   TSH 3.72 01/07/2021 11:51 AM   TSH 3.74 12/26/2019 08:53 AM   FREET4 1.25 (H) 10/06/2014 08:45 AM   FREET4 0.79 08/15/2010 03:56 PM       Latest Ref Rng & Units 05/06/2022    2:54 PM 01/27/2022   10:42 AM 01/07/2021   11:51 AM  CBC  WBC 4.0 - 10.5 K/uL 8.7  7.6  7.8   Hemoglobin 13.0 - 17.0 g/dL 13.2  44.0  10.2   Hematocrit 39.0 - 52.0 %  46.6  45.2  43.2   Platelets 150 - 400 K/uL 306  326.0  333.0     Lab Results  Component Value Date/Time   VITAMINB12 217 01/27/2022 10:42 AM   VITAMINB12 281 07/22/2021 08:20 AM    Clinical ASCVD: Yes  The ASCVD Risk score (Arnett DK, et al., 2019) failed to calculate for the following reasons:   The 2019 ASCVD risk score is only valid for ages 73 to 48       07/19/2021   11:01 AM 07/18/2020   10:31 AM 01/03/2020   11:10 AM  Depression screen PHQ 2/9  Decreased Interest 0 0 0  Down, Depressed, Hopeless 0 0 0  PHQ - 2 Score 0 0 0  Altered sleeping  0   Tired, decreased energy  0   Change in appetite  0   Feeling bad or failure about yourself   0   Trouble concentrating  0   Moving slowly or fidgety/restless  0   Suicidal thoughts  0   PHQ-9 Score  0   Difficult doing work/chores  Not difficult at all      Social History   Tobacco Use  Smoking Status Never  Smokeless Tobacco Never   BP Readings from Last 3 Encounters:  05/06/22  (!) 152/133  01/27/22 (!) 142/60  07/26/21 140/70   Pulse Readings from Last 3 Encounters:  05/06/22 75  01/27/22 78  07/26/21 83   Wt Readings from Last 3 Encounters:  05/06/22 220 lb 7.4 oz (100 kg)  01/27/22 222 lb (100.7 kg)  07/26/21 221 lb (100.2 kg)   BMI Readings from Last 3 Encounters:  05/06/22 29.90 kg/m  01/27/22 30.11 kg/m  07/26/21 29.97 kg/m    Allergies  Allergen Reactions   Morphine Nausea Only, Other (See Comments) and Nausea And Vomiting    Can take with food and usually doesn't cause nausea   Atorvastatin     Intolerant - nausea/myalgias   Codeine Phosphate Nausea And Vomiting   Metformin And Related Diarrhea    Medications Reviewed Today     Reviewed by Joaquim Nam, MD (Physician) on 01/27/22 at 1029  Med List Status: <None>   Medication Order Taking? Sig Documenting Provider Last Dose Status Informant  Accu-Chek Softclix Lancets lancets [id number] Yes TEST BLOOD SUGAR UP TO THREE TIMES DAILY Joaquim Nam, MD Taking Active   amLODipine (NORVASC) 10 MG tablet [id number] Yes TAKE 1 TABLET BY MOUTH IN THE  MORNING Joaquim Nam, MD Taking Active   aspirin EC 81 MG tablet [id number] Yes Take 81 mg by mouth every evening. [provider] Taking Active Self  Blood Glucose Calibration (ACCU-CHEK AVIVA) SOLN [id number] Yes Use as directed monthly. E11.59  Insulin dependent Joaquim Nam, MD Taking Active   Blood Glucose Monitoring Suppl (ACCU-CHEK AVIVA PLUS) w/Device KIT [id number] Yes Check sugar up to 3 times daily. E11.59  Insulin dependent Joaquim Nam, MD Taking Active Self  carvedilol (COREG) 12.5 MG tablet [id number] Yes TAKE 1 TABLET BY MOUTH TWICE  DAILY WITH MEALS Joaquim Nam, MD Taking Active   COD LIVER OIL PO [id number] Yes Take 1 capsule by mouth 2 (two) times daily.  [provider] Taking Active Self  cyanocobalamin (,VITAMIN B-12,) 1000 MCG/ML injection [id number] Yes Inject 1 mL (1,000 mcg total) into the  muscle every 30 (thirty) days. Dispense 3 of the 1 mL vials. Joaquim Nam, MD Taking Active   DROPLET PEN NEEDLES 30G X  8 MM MISC [id number] Yes USE  WITH  INSULIN  PEN Joaquim Nam, MD Taking Active   glimepiride (AMARYL) 4 MG tablet [id number] Yes Take one-half tablet by mouth with breakfast Joaquim Nam, MD Taking Active   HUMALOG KWIKPEN 100 UNIT/ML KwikPen [id number] Yes INJECT SUBCUTANEOUSLY 4-10  UNITS 3 TIMES DAILY BEFORE  MEALS PER SLIDING SCALE  151-200 4 UN, 201-250 6 UN, 251-300 8 UN, &gt;300 10 Joylene John, Dwana Curd, MD Taking Active   hydrALAZINE (APRESOLINE) 50 MG tablet [id number] Yes TAKE 1 TABLET BY MOUTH 3 TIMES  DAILY Joaquim Nam, MD Taking Active   insulin glargine, 1 Unit Dial, ([name] SOLOSTAR) 300 UNIT/ML Solostar Pen [id number] Yes INJECT SUBCUTANEOUSLY 12  UNITS AT BEDTIME Joaquim Nam, MD Taking Active   lisinopril-hydrochlorothiazide (ZESTORETIC) 20-25 MG tablet [id number] Yes TAKE 1 TABLET BY MOUTH IN THE  MORNING Joaquim Nam, MD Taking Active   [facility] ULTRA test strip [id number] Yes CHECK UP TO 3 TIMES DAILY AS  NEEDED Joaquim Nam, MD Taking Active   potassium chloride SA (KLOR-CON M) 20 MEQ tablet [id number] Yes TAKE 1 TABLET BY MOUTH DAILY Joaquim Nam, MD Taking Active   SYRINGE-NEEDLE, DISP, 3 ML (B-D 3CC LUER-LOK SYR 25GX1") 25G X 1" 3 ML MISC [id number] Yes USE TO INJECT VITAMIN B12 MONTHLY. Joaquim Nam, MD Taking Active   tamsulosin [facility]) 0.4 MG CAPS capsule [id number] Yes TAKE 1 CAPSULE BY MOUTH  DAILY AFTER SUPPER Joaquim Nam, MD Taking Active   traMADol (ULTRAM) 50 MG tablet [id number] Yes TAKE 1 TABLET BY MOUTH EVERY 12 HOURS AS NEEDED FOR KNEE PAIN. Joaquim Nam, MD Taking Active             SDOH:  (Social Determinants of Health) assessments and interventions performed: {yes/no:20286} SDOH Interventions    Flowsheet Row Clinical Support from 07/19/2021 in [facility] Clinical  Support from 07/18/2020 in [facility] [place]  SDOH Interventions    Food Insecurity Interventions Intervention Not Indicated --  Housing Interventions Intervention Not Indicated --  Transportation Interventions Intervention Not Indicated --  Depression Interventions/Treatment  -- ATF5-7 Score <4 Follow-up Not Indicated  Financial Strain Interventions Intervention Not Indicated --  Physical Activity Interventions Intervention Not Indicated --  Stress Interventions Intervention Not Indicated --  Social Connections Interventions Intervention Not Indicated --       Medication Assistance: {MEDASSISTANCEINFO:25044}  Medication Access: Within the past 30 days, how often has patient missed a dose of medication? *** Is a pillbox or other method used to improve adherence? {YES/NO:21197} Factors that may affect medication adherence? {CHL DESC; BARRIERS:21522} Are meds synced by current pharmacy? {YES/NO:21197} Are meds delivered by current pharmacy? {YES/NO:21197} Does patient experience delays in picking up medications due to transportation concerns? {YES/NO:21197}  [facility] Reviewed: Is patient disadvantaged to use [facility]?: {YES/NO:21197} Current Rx insurance plan: *** Name and location of Current pharmacy:  [facility] - [place], [place] - [address] [place] [address] Phone: [phone] Fax: [phone]  [facility]) - [place], [place] - [address] [facility] [address] [place] [address] [place] [address] Phone: [phone] Fax: [phone]  [facility] - [place], [place] - [address] [place]  [address] Phone: [phone] Fax: [phone]  [facility] services reviewed with patient today?: {YES/NO:21197} Patient requests  to transfer care to [facility]?: {YES/NO:21197} Reason patient  declined to change pharmacies: {US patient preference:27474}  Compliance/Adherence/Medication fill history: Care Gaps: None  Star-Rating Drugs: Glimepiride - PDC 100% Lisinopril-HCTZ - PDC 100%   ASSESSMENT / PLAN  Heart Failure (Goal: BP < 140/90) -{US controlled/uncontrolled:25276} -Last ejection fraction: 40-45% (Date: 03/2020), previously 35-40% in 2015 -HF type: HFimpEF (EF improved from <40% to > 40%) -NYHA Class: {CHL HP [facility] NYHA Class:[phone]} -AHA HF Stage: {CHL HP [facility] AHA HF Stage:[phone]} -Current treatment: Amlodipine 10 mg daily Carvedilol 12.5 mg BID Hydralazine 50 mg TID Lisinopril-HCTZ 20-25 mg daily Klor Con 20 mEq -Medications previously tried: ***  -Current home BP/HR readings: *** -Current home daily weights: *** -Current dietary habits: *** -Current exercise habits: *** -Educated on {CCM HF Counseling:25125} -{CCMPHARMDINTERVENTION:25122}  Hyperlipidemia: (LDL goal < 70) -{US controlled/uncontrolled:25276} - LDL 88 (01/2022) -Current treatment: Cod liver oil Aspirin 81 mg daily -Medications previously tried: atorvastatin (myalgias), pravastatin (weakness) -Current dietary patterns: *** -Current exercise habits: *** -Educated on {CCM HLD Counseling:25126} -{CCMPHARMDINTERVENTION:25122}  Diabetes (A1c goal <8%) -{US controlled/uncontrolled:25276} - A1c 8.9% (01/2022) -Current home glucose readings fasting glucose: *** post prandial glucose: *** -{ACTIONS;DENIES/REPORTS:[id number]::"Denies"} hypoglycemic/hyperglycemic symptoms -Current meal patterns:  breakfast: ***  lunch: ***  dinner: *** snacks: *** drinks: *** -Current exercise: *** -Current medications: Glimepiride 4 mg - 1/2 tab daily [name] U300 - 12 units daily HS Humalog Kwikpen 4-10 units TID w/ meals Accu Chek supplies -Medications previously tried: metformin (GI)  -Educated on {CCM DM COUNSELING:25123} -Counseled to check feet daily and get yearly  eye exams -{CCMPHARMDINTERVENTION:25122}  Fall risk (Goal: ***) -On tramadol PRN -Hypoglycemia risk is high on insulin + glimepiride  Health Maintenance -Vaccine gaps: *** -Hx BPH: on tamsulosin -B12 deficiency: B12 1000 mcg injection  ***

## 2022-05-28 NOTE — Patient Instructions (Signed)
Visit Information  Phone number for Pharmacist: [phone]  Thank you for meeting with me to discuss your medications! I look forward to working with you to achieve your health care goals. Below is a summary of what we talked about during the visit:  Continue to hold glimepiride.  Pick up Freestyle Libre sensors and reader device. Feel free to call if you have any issues setting this up.  Come back in 3 weeks so we can look at the sugar report together.   Al Corpus, PharmD, BCACP Clinical Pharmacist  Primary Care at [facility] [phone]

## 2022-06-13 ENCOUNTER — Telehealth: Payer: Self-pay

## 2022-06-13 NOTE — Progress Notes (Signed)
Care Management & Coordination Services Pharmacy Team  Reason for Encounter: Appointment Reminder  Contacted patient to confirm in office appointment with Al Corpus , PharmD on 06/18/22 at 2:00. Spoke with patient on 06/13/2022   Do you have any problems getting your medications? No  What is your top health concern you would like to discuss at your upcoming visit?  Patient states to cancel the appointment due to not interested in getting CGM. Patient had bad experience with the monitor on his arm. Painful and he thought he was having a heart attack,He does not want the CGM.  Have you seen any other providers since your last visit with PCP? No    Hospital visits:  None in previous 6 months   Star Rating Drugs:  Medication:  Last Fill: Day Supply Lisinopril/Hctz 20-25mg    04/17/22 100 [name]   04/29/22 113 Humalog  05/19/22  100 Glimepiride 4mg  03/26/22 100  Care Gaps: Annual wellness visit in last year? Yes  If Diabetic: Last eye exam / retinopathy screening:UTD Last diabetic foot exam:UTD   Al Corpus, PharmD notified  Burt Knack, [facility] Clinical Pharmacy Assistant [phone]

## 2022-06-18 ENCOUNTER — Encounter: Payer: Medicare Other | Admitting: Pharmacist

## 2022-06-21 ENCOUNTER — Other Ambulatory Visit: Payer: Self-pay | Admitting: Family Medicine

## 2022-07-10 ENCOUNTER — Other Ambulatory Visit: Payer: Self-pay | Admitting: Family Medicine

## 2022-07-10 ENCOUNTER — Telehealth: Payer: Self-pay | Admitting: Family Medicine

## 2022-07-10 DIAGNOSIS — E1159 Type 2 diabetes mellitus with other circulatory complications: Secondary | ICD-10-CM

## 2022-07-10 DIAGNOSIS — E538 Deficiency of other specified B group vitamins: Secondary | ICD-10-CM

## 2022-07-10 NOTE — Telephone Encounter (Signed)
LOV - 01/27/22 NOV - not scheduled RF - 03/26/22 #60/2   *Front staff please call patient to set up his Medicare wellness with Dr. Para March in June; already has wellness nurse visit scheduled.

## 2022-07-10 NOTE — Telephone Encounter (Signed)
Duplicate refill request.

## 2022-07-10 NOTE — Telephone Encounter (Signed)
Patient scheduled for labs/ cpe

## 2022-07-10 NOTE — Telephone Encounter (Signed)
Prescription Request  07/10/2022  LOV: 01/27/2022  What is the name of the medication or equipment? traMADol (ULTRAM) 50 MG tablet   Have you contacted your pharmacy to request a refill? Yes   Which pharmacy would you like this sent to?  [facility] - [place], [place] - [address] [place] [address] Phone: [phone] Fax: [phone]  Patient notified that their request is being sent to the clinical staff for review and that they should receive a response within 2 business days.   Please advise at Mobile [phone] (mobile)

## 2022-07-11 NOTE — Telephone Encounter (Signed)
Labs ordered, rx sent. Thanks.

## 2022-07-11 NOTE — Addendum Note (Signed)
Addended by: Joaquim Nam on: 07/11/2022 11:36 AM   Modules accepted: Orders

## 2022-07-22 ENCOUNTER — Ambulatory Visit (INDEPENDENT_AMBULATORY_CARE_PROVIDER_SITE_OTHER): Payer: Medicare Other

## 2022-07-22 VITALS — Ht 72.0 in | Wt 218.0 lb

## 2022-07-22 DIAGNOSIS — Z0000 Encounter for general adult medical examination without abnormal findings: Secondary | ICD-10-CM

## 2022-07-22 NOTE — Patient Instructions (Signed)
[name] , Thank you for taking time to come for your Medicare Wellness Visit. I appreciate your ongoing commitment to your health goals. Please review the following plan we discussed and let me know if I can assist you in the future.   These are the goals we discussed:  Goals      DIET - EAT MORE FRUITS AND VEGETABLES     Increase water intake     Starting 12/25/2017, I will continue to drink 6-8 glasses of water daily.      Patient Stated     07/18/2020, I will continue to walk 30 minutes daily.     Patient Stated     No new goals        This is a list of the screening recommended for you and due dates:  Health Maintenance  Topic Date Due   Zoster (Shingles) Vaccine (1 of 2) Never done   COVID-19 Vaccine (5 - 2023-24 season) 10/11/2021   Complete foot exam   07/27/2022   Hemoglobin A1C  07/29/2022   Eye exam for diabetics  08/08/2022   Flu Shot  09/11/2022   Yearly kidney health urinalysis for diabetes  01/28/2023   Yearly kidney function blood test for diabetes  05/06/2023   Medicare Annual Wellness Visit  07/22/2023   DTaP/Tdap/Td vaccine (5 - Td or Tdap) 05/05/2032   Pneumonia Vaccine  Completed   HPV Vaccine  Aged Out   Managing Pain Without Opioids Opioids are strong medicines used to treat moderate to severe pain. For some people, especially those who have long-term (chronic) pain, opioids may not be the best choice for pain management due to: Side effects like nausea, constipation, and sleepiness. The risk of addiction (opioid use disorder). The longer you take opioids, the greater your risk of addiction. Pain that lasts for more than 3 months is called chronic pain. Managing chronic pain usually requires more than one approach and is often provided by a team of health care providers working together (multidisciplinary approach). Pain management may be done at a pain management center or pain clinic. How to manage pain without the use of opioids Use non-opioid  medicines Non-opioid medicines for pain may include: Over-the-counter or prescription non-steroidal anti-inflammatory drugs (NSAIDs). These may be the first medicines used for pain. They work well for muscle and bone pain, and they reduce swelling. Acetaminophen. This over-the-counter medicine may work well for milder pain but not swelling. Antidepressants. These may be used to treat chronic pain. A certain type of antidepressant (tricyclics) is often used. These medicines are given in lower doses for pain than when used for depression. Anticonvulsants. These are usually used to treat seizures but may also reduce nerve (neuropathic) pain. Muscle relaxants. These relieve pain caused by sudden muscle tightening (spasms). You may also use a pain medicine that is applied to the skin as a patch, cream, or gel (topical analgesic), such as a numbing medicine. These may cause fewer side effects than medicines taken by mouth. Do certain therapies as directed Some therapies can help with pain management. They include: Physical therapy. You will do exercises to gain strength and flexibility. A physical therapist may teach you exercises to move and stretch parts of your body that are weak, stiff, or painful. You can learn these exercises at physical therapy visits and practice them at home. Physical therapy may also involve: Massage. Heat wraps or applying heat or cold to affected areas. Electrical signals that interrupt pain signals (transcutaneous electrical nerve  stimulation, TENS). Weak lasers that reduce pain and swelling (low-level laser therapy). Signals from your body that help you learn to regulate pain (biofeedback). Occupational therapy. This helps you to learn ways to function at home and work with less pain. Recreational therapy. This involves trying new activities or hobbies, such as a physical activity or drawing. Mental health therapy, including: Cognitive behavioral therapy (CBT). This helps  you learn coping skills for dealing with pain. Acceptance and commitment therapy (ACT) to change the way you think and react to pain. Relaxation therapies, including muscle relaxation exercises and mindfulness-based stress reduction. Pain management counseling. This may be individual, family, or group counseling.  Receive medical treatments Medical treatments for pain management include: Nerve block injections. These may include a pain blocker and anti-inflammatory medicines. You may have injections: Near the spine to relieve chronic back or neck pain. Into joints to relieve back or joint pain. Into nerve areas that supply a painful area to relieve body pain. Into muscles (trigger point injections) to relieve some painful muscle conditions. A medical device placed near your spine to help block pain signals and relieve nerve pain or chronic back pain (spinal cord stimulation device). Acupuncture. Follow these instructions at home Medicines Take over-the-counter and prescription medicines only as told by your health care provider. If you are taking pain medicine, ask your health care providers about possible side effects to watch out for. Do not drive or use heavy machinery while taking prescription opioid pain medicine. Lifestyle  Do not use drugs or alcohol to reduce pain. If you drink alcohol, limit how much you have to: 0-1 drink a day for women who are not pregnant. 0-2 drinks a day for men. Know how much alcohol is in a drink. In the [place] one drink equals one 12 oz bottle of beer (355 mL), one 5 oz glass of wine (148 mL), or one 1 oz glass of hard liquor (44 mL). Do not use any products that contain nicotine or tobacco. These products include cigarettes, chewing tobacco, and vaping devices, such as e-cigarettes. If you need help quitting, ask your health care provider. Eat a healthy diet and maintain a healthy weight. Poor diet and excess weight may make pain worse. Eat foods that  are high in fiber. These include fresh fruits and vegetables, whole grains, and beans. Limit foods that are high in fat and processed sugars, such as fried and sweet foods. Exercise regularly. Exercise lowers stress and may help relieve pain. Ask your health care provider what activities and exercises are safe for you. If your health care provider approves, join an exercise class that combines movement and stress reduction. Examples include yoga and tai chi. Get enough sleep. Lack of sleep may make pain worse. Lower stress as much as possible. Practice stress reduction techniques as told by your therapist. General instructions Work with all your pain management providers to find the treatments that work best for you. You are an important member of your pain management team. There are many things you can do to reduce pain on your own. Consider joining an online or in-person support group for people who have chronic pain. Keep all follow-up visits. This is important. Where to find more information You can find more information about managing pain without opioids from: American Academy of Pain Medicine: painmed.org Institute for Chronic Pain: instituteforchronicpain.org American Chronic Pain Association: theacpa.org Contact a health care provider if: You have side effects from pain medicine. Your pain gets worse or does not  get better with treatments or home therapy. You are struggling with anxiety or depression. Summary Many types of pain can be managed without opioids. Chronic pain may respond better to pain management without opioids. Pain is best managed when you and a team of health care providers work together. Pain management without opioids may include non-opioid medicines, medical treatments, physical therapy, mental health therapy, and lifestyle changes. Tell your health care providers if your pain gets worse or is not being managed well enough. This information is not intended to  replace advice given to you by your health care provider. Make sure you discuss any questions you have with your health care provider. Document Revised: 05/09/2020 Document Reviewed: 05/09/2020 Elsevier Patient Education  2024 [employer]   Advanced directives: Please bring a copy of your health care power of attorney and living will to the office to be added to your chart at your convenience.   Conditions/risks identified: Aim for 30 minutes of exercise or brisk walking, 6-8 glasses of water, and 5 servings of fruits and vegetables each day.   Next appointment: Follow up in one year for your annual wellness visit. 07/23/23 @ 1:00pm telephone   Preventive Care 65 Years and Older, Male  Preventive care refers to lifestyle choices and visits with your health care provider that can promote health and wellness. What does preventive care include? A yearly physical exam. This is also called an annual well check. Dental exams once or twice a year. Routine eye exams. Ask your health care provider how often you should have your eyes checked. Personal lifestyle choices, including: Daily care of your teeth and gums. Regular physical activity. Eating a healthy diet. Avoiding tobacco and drug use. Limiting alcohol use. Practicing safe sex. Taking low doses of aspirin every day. Taking vitamin and mineral supplements as recommended by your health care provider. What happens during an annual well check? The services and screenings done by your health care provider during your annual well check will depend on your age, overall health, lifestyle risk factors, and family history of disease. Counseling  Your health care provider may ask you questions about your: Alcohol use. Tobacco use. Drug use. Emotional well-being. Home and relationship well-being. Sexual activity. Eating habits. History of falls. Memory and ability to understand (cognition). Work and work Astronomer. Screening  You  may have the following tests or measurements: Height, weight, and BMI. Blood pressure. Lipid and cholesterol levels. These may be checked every 5 years, or more frequently if you are over 71 years old. Skin check. Lung cancer screening. You may have this screening every year starting at age 81 if you have a 30-pack-year history of smoking and currently smoke or have quit within the past 15 years. Fecal occult blood test (FOBT) of the stool. You may have this test every year starting at age 7. Flexible sigmoidoscopy or colonoscopy. You may have a sigmoidoscopy every 5 years or a colonoscopy every 10 years starting at age 6. Prostate cancer screening. Recommendations will vary depending on your family history and other risks. Hepatitis C blood test. Hepatitis B blood test. Sexually transmitted disease (STD) testing. Diabetes screening. This is done by checking your blood sugar (glucose) after you have not eaten for a while (fasting). You may have this done every 1-3 years. Abdominal aortic aneurysm (AAA) screening. You may need this if you are a current or former smoker. Osteoporosis. You may be screened starting at age 52 if you are at high risk. Talk with your  health care provider about your test results, treatment options, and if necessary, the need for more tests. Vaccines  Your health care provider may recommend certain vaccines, such as: Influenza vaccine. This is recommended every year. Tetanus, diphtheria, and acellular pertussis (Tdap, Td) vaccine. You may need a Td booster every 10 years. Zoster vaccine. You may need this after age 24. Pneumococcal 13-valent conjugate (PCV13) vaccine. One dose is recommended after age 86. Pneumococcal polysaccharide (PPSV23) vaccine. One dose is recommended after age 81. Talk to your health care provider about which screenings and vaccines you need and how often you need them. This information is not intended to replace advice given to you by your  health care provider. Make sure you discuss any questions you have with your health care provider. Document Released: 02/23/2015 Document Revised: 10/17/2015 Document Reviewed: 11/28/2014 Elsevier Interactive Patient Education  2017 [employer].  Fall Prevention in the Home Falls can cause injuries. They can happen to people of all ages. There are many things you can do to make your home safe and to help prevent falls. What can I do on the outside of my home? Regularly fix the edges of walkways and driveways and fix any cracks. Remove anything that might make you trip as you walk through a door, such as a raised step or threshold. Trim any bushes or trees on the path to your home. Use bright outdoor lighting. Clear any walking paths of anything that might make someone trip, such as rocks or tools. Regularly check to see if handrails are loose or broken. Make sure that both sides of any steps have handrails. Any raised decks and porches should have guardrails on the edges. Have any leaves, snow, or ice cleared regularly. Use sand or salt on walking paths during winter. Clean up any spills in your garage right away. This includes oil or grease spills. What can I do in the bathroom? Use night lights. Install grab bars by the toilet and in the tub and shower. Do not use towel bars as grab bars. Use non-skid mats or decals in the tub or shower. If you need to sit down in the shower, use a plastic, non-slip stool. Keep the floor dry. Clean up any water that spills on the floor as soon as it happens. Remove soap buildup in the tub or shower regularly. Attach bath mats securely with double-sided non-slip rug tape. Do not have throw rugs and other things on the floor that can make you trip. What can I do in the bedroom? Use night lights. Make sure that you have a light by your bed that is easy to reach. Do not use any sheets or blankets that are too big for your bed. They should not hang down  onto the floor. Have a firm chair that has side arms. You can use this for support while you get dressed. Do not have throw rugs and other things on the floor that can make you trip. What can I do in the kitchen? Clean up any spills right away. Avoid walking on wet floors. Keep items that you use a lot in easy-to-reach places. If you need to reach something above you, use a strong step stool that has a grab bar. Keep electrical cords out of the way. Do not use floor polish or wax that makes floors slippery. If you must use wax, use non-skid floor wax. Do not have throw rugs and other things on the floor that can make you trip. What  can I do with my stairs? Do not leave any items on the stairs. Make sure that there are handrails on both sides of the stairs and use them. Fix handrails that are broken or loose. Make sure that handrails are as long as the stairways. Check any carpeting to make sure that it is firmly attached to the stairs. Fix any carpet that is loose or worn. Avoid having throw rugs at the top or bottom of the stairs. If you do have throw rugs, attach them to the floor with carpet tape. Make sure that you have a light switch at the top of the stairs and the bottom of the stairs. If you do not have them, ask someone to add them for you. What else can I do to help prevent falls? Wear shoes that: Do not have high heels. Have rubber bottoms. Are comfortable and fit you well. Are closed at the toe. Do not wear sandals. If you use a stepladder: Make sure that it is fully opened. Do not climb a closed stepladder. Make sure that both sides of the stepladder are locked into place. Ask someone to hold it for you, if possible. Clearly mark and make sure that you can see: Any grab bars or handrails. First and last steps. Where the edge of each step is. Use tools that help you move around (mobility aids) if they are needed. These include: Canes. Walkers. Scooters. Crutches. Turn  on the lights when you go into a dark area. Replace any light bulbs as soon as they burn out. Set up your furniture so you have a clear path. Avoid moving your furniture around. If any of your floors are uneven, fix them. If there are any pets around you, be aware of where they are. Review your medicines with your doctor. Some medicines can make you feel dizzy. This can increase your chance of falling. Ask your doctor what other things that you can do to help prevent falls. This information is not intended to replace advice given to you by your health care provider. Make sure you discuss any questions you have with your health care provider. Document Released: 11/23/2008 Document Revised: 07/05/2015 Document Reviewed: 03/03/2014 Elsevier Interactive Patient Education  2017 [employer].

## 2022-07-22 NOTE — Progress Notes (Signed)
I connected with  [name] on 07/22/22 by a audio enabled telemedicine application and verified that I am speaking with the correct person using two identifiers.  Patient Location: Home  Provider Location: Home Office  I discussed the limitations of evaluation and management by telemedicine. The patient expressed understanding and agreed to proceed.  Subjective:   [name] is a 88 y.o. male who presents for Medicare Annual/Subsequent preventive examination.  Review of Systems      Cardiac Risk Factors include: advanced age (>60men, >59 women);hypertension;diabetes mellitus;male gender;sedentary lifestyle     Objective:    Today's Vitals   07/22/22 1303  Weight: 218 lb (98.9 kg)  Height: 6' (1.829 m)   Body mass index is 29.57 kg/m.     07/22/2022    1:13 PM 05/06/2022    2:35 PM 07/19/2021   11:02 AM 07/18/2020   10:30 AM 04/01/2020   11:37 PM 04/01/2020   11:36 PM 03/29/2020    6:21 AM  Advanced Directives  Does Patient Have a Medical Advance Directive? Yes No No Yes  Yes Yes  Type of Estate agent of [place];Living will   Healthcare Power of [place];Living will Living will  Living will  Does patient want to make changes to medical advance directive?     No - Patient declined No - Patient declined No - Patient declined  Copy of Healthcare Power of Attorney in Chart? No - copy requested   No - copy requested     Would patient like information on creating a medical advance directive?  No - Patient declined No - Patient declined        Current Medications (verified) Outpatient Encounter Medications as of 07/22/2022  Medication Sig   Accu-Chek Softclix Lancets lancets TEST BLOOD SUGAR UP TO THREE TIMES DAILY   amLODipine (NORVASC) 10 MG tablet TAKE 1 TABLET BY MOUTH IN THE  MORNING   aspirin EC 81 MG tablet Take 81 mg by mouth every evening.   Blood Glucose Calibration (ACCU-CHEK AVIVA) SOLN Use as directed monthly. E11.59  Insulin dependent    Blood Glucose Monitoring Suppl (ACCU-CHEK AVIVA PLUS) w/Device KIT Check sugar up to 3 times daily. E11.59  Insulin dependent   carvedilol (COREG) 12.5 MG tablet TAKE 1 TABLET BY MOUTH TWICE  DAILY WITH MEALS   COD LIVER OIL PO Take 1 capsule by mouth 2 (two) times daily.    Continuous Glucose Receiver (FREESTYLE LIBRE 3 READER) DEVI 1 each by Does not apply route as directed.   Continuous Glucose Sensor (FREESTYLE LIBRE 3 SENSOR) MISC Place 1 sensor on the skin every 14 days. Use to check glucose continuously   cyanocobalamin (,VITAMIN B-12,) 1000 MCG/ML injection Inject 1 mL (1,000 mcg total) into the muscle every 30 (thirty) days. Dispense 3 of the 1 mL vials.   DROPLET PEN NEEDLES 30G X 8 MM MISC USE  WITH  INSULIN  PEN   HUMALOG KWIKPEN 100 UNIT/ML KwikPen INJECT SUBCUTANEOUSLY 4-10 UNITS 3 TIMES DAILY BEFORE MEALS PER  SLIDING SCALE 151-200 4 UN,  201-250 6 UN, 251-300 8 UN, AND  OVER 300 10 UN   hydrALAZINE (APRESOLINE) 50 MG tablet TAKE 1 TABLET BY MOUTH 3 TIMES  DAILY   insulin glargine, 1 Unit Dial, ([name] SOLOSTAR) 300 UNIT/ML Solostar Pen INJECT SUBCUTANEOUSLY 12  UNITS AT BEDTIME   lisinopril-hydrochlorothiazide (ZESTORETIC) 20-25 MG tablet TAKE 1 TABLET BY MOUTH IN THE  MORNING   ONETOUCH ULTRA test strip CHECK UP TO  3 TIMES DAILY AS  NEEDED   potassium chloride SA (KLOR-CON M) 20 MEQ tablet TAKE 1 TABLET BY MOUTH DAILY   SYRINGE-NEEDLE, DISP, 3 ML (B-D 3CC LUER-LOK SYR 25GX1") 25G X 1" 3 ML MISC USE TO INJECT VITAMIN B12 MONTHLY.   tamsulosin (FLOMAX) 0.4 MG CAPS capsule TAKE 1 CAPSULE BY MOUTH DAILY  AFTER SUPPER   traMADol (ULTRAM) 50 MG tablet TAKE 1 TABLET BY MOUTH EVERY 12 HOURS AS NEEDED FOR KNEE PAIN.   No facility-administered encounter medications on file as of 07/22/2022.    Allergies (verified) Morphine, Atorvastatin, Codeine phosphate, and Metformin and related   History: Past Medical History:  Diagnosis Date   Anxiety    Borderline hyperlipidemia    CAD  (coronary artery disease)    pt. denies at preop   Colon polyps    Complication of anesthesia    trouble urinating after anesthesia   Diverticulosis of colon    DJD (degenerative joint disease)    DM (diabetes mellitus) (HCC)    Adult onset  type 2   History of gastritis    History of kidney stones    surgery to remove   History of pyelonephritis    History of syncope    HTN (hypertension)    Hypertrophy of prostate with urinary obstruction and other lower urinary tract symptoms (LUTS)    Melanoma (HCC)    back of neck   Melanoma (HCC) 2022   per dermatology   Myoclonus    Pancreatitis due to biliary obstruction several years ago   treated by Dr. Lina Sar   Past Surgical History:  Procedure Laterality Date   APPENDECTOMY     cartarized  nose blood vessel     left side   CATARACT EXTRACTION     RIGHT EYE   CHOLECYSTECTOMY     cystoscopy  04/17/11   stent placed   CYSTOSCOPY W/ URETERAL STENT PLACEMENT  04/17/2011   Procedure: CYSTOSCOPY WITH RETROGRADE PYELOGRAM/URETERAL STENT PLACEMENT;  Surgeon: Lindaann Slough, MD;  Location: [facility];  Service: Urology;  Laterality: Left;   CYSTOSCOPY WITH RETROGRADE PYELOGRAM, URETEROSCOPY AND STENT PLACEMENT Left 04/18/2013   Procedure: CYSTOSCOPY WITH LEFT RETROGRADE PYELOGRAM, LEFT URETEROSCOPY AND LASER LITHOTRIPSY LEFT STENT PLACEMENT;  Surgeon: Crecencio Mc, MD;  Location: [facility];  Service: Urology;  Laterality: Left;   HOLMIUM LASER APPLICATION Left 04/18/2013   Procedure: HOLMIUM LASER APPLICATION;  Surgeon: Crecencio Mc, MD;  Location: [facility];  Service: Urology;  Laterality: Left;   INCISION AND DRAINAGE ABSCESS Left 04/04/2020   Procedure: Angelina Pih OF LEFT ARM HEMATOMA;  Surgeon: Almond Lint, MD;  Location: [facility];  Service: General;  Laterality: Left;  left   IRRIGATION AND DEBRIDEMENT ABSCESS Left 10/05/2014   Procedure: Leighton Ruff D LEFT INDEX FINGER;  Surgeon: Knute Neu, MD;  Location: [facility];  Service: Plastics;  Laterality: Left;    KNEE SURGERY     bilateral ARTHROSCOPY X2 TO EACH KNEE   MELANOMA EXCISION  2018   MELANOMA EXCISION WITH SENTINEL LYMPH NODE BIOPSY Left 03/29/2020   Procedure: WIDE LOCAL EXCISION, ADVANCED FLAP CLOSURE LEFT ARM MELANOMA DEFECT WITH SENTINEL LYMPH NODE MAPPING AND BIOPSY;  Surgeon: Almond Lint, MD;  Location: [facility];  Service: General;  Laterality: Left;   S/P cysto & stents for kidnedy stone 11/08 by Dr. Wanda Plump     S/P ELap 1986 w/excision of leiomyoma @ GE junction, Meckle's divertic resected, & cholecystectomy     SHOULDER SURGERY  04/2005  left by Dr. Luiz Blare   Family History  Problem Relation Age of Onset   Heart attack Mother    Dementia Brother    Colon cancer Neg Hx    Prostate cancer Neg Hx    Social History   Socioeconomic History   Marital status: Widowed    Spouse name: Not on file   Number of children: 2   Years of education: Not on file   Highest education level: Not on file  Occupational History   Occupation: Retired  Tobacco Use   Smoking status: Never   Smokeless tobacco: Never  Vaping Use   Vaping Use: Never used  Substance and Sexual Activity   Alcohol use: No    Alcohol/week: 0.0 standard drinks of alcohol   Drug use: No   Sexual activity: Not Currently  Other Topics Concern   Not on file  Social History Narrative   Widowed 08/10/04   Initially 2 kids- 1 son still alive as of 2018/08/11.  Daughter died 06/11/18 in coronavirus pandemic.  Son lives with patient.    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   Social Determinants of Corporate investment banker Strain: Low Risk  (07/22/2022)   Overall Financial Resource Strain (CARDIA)    Difficulty of Paying Living Expenses: Not hard at all  Food Insecurity: No Food Insecurity (07/22/2022)   Hunger Vital Sign    Worried About Running Out of Food in the Last Year: Never true    Ran Out of Food in the Last Year: Never true  Transportation Needs: No Transportation Needs (07/22/2022)    PRAPARE - Administrator, Civil Service (Medical): No    Lack of Transportation (Non-Medical): No  Physical Activity: Sufficiently Active (07/22/2022)   Exercise Vital Sign    Days of Exercise per Week: 7 days    Minutes of Exercise per Session: 30 min  Stress: No Stress Concern Present (07/22/2022)   [employer] of Occupational Health - Occupational Stress Questionnaire    Feeling of Stress : Not at all  Social Connections: Moderately Isolated (07/22/2022)   Social Connection and Isolation Panel [NHANES]    Frequency of Communication with Friends and Family: More than three times a week    Frequency of Social Gatherings with Friends and Family: More than three times a week    Attends Religious Services: More than 4 times per year    Active Member of [employer] or Organizations: No    Attends Banker Meetings: Never    Marital Status: Widowed    Tobacco Counseling Counseling given: Not Answered   Clinical Intake:  Pre-visit preparation completed: Yes  Pain : No/denies pain     Nutritional Risks: None Diabetes: Yes CBG done?: Yes (152 per pt) CBG resulted in Enter/ Edit results?: No Did pt. bring in CBG monitor from home?: No  How often do you need to have someone help you when you read instructions, pamphlets, or other written materials from your doctor or pharmacy?: 1 - Never  Diabetic? Nutrition Risk Assessment:  Has the patient had any N/V/D within the last 2 months?  No  Does the patient have any non-healing wounds?  No  Has the patient had any unintentional weight loss or weight gain?  No   Diabetes:  Is the patient diabetic?  Yes  If diabetic, was a CBG obtained today?  Yes ,152 per pt Did the patient bring in their glucometer from home?  No  How often do you monitor your CBG's? TID.   Financial Strains and Diabetes Management:  Are you having any financial strains with the device, your supplies or your medication? No .  Does the  patient want to be seen by Chronic Care Management for management of their diabetes?  No  Would the patient like to be referred to a Nutritionist or for Diabetic Management?  No   Diabetic Exams:  Diabetic Eye Exam: Completed 08/07/21 Dr.Groat Diabetic Foot Exam: Completed 07/26/21 PCP    Interpreter Needed?: No  Information entered by :: C.Kenyata Guess LPN   Activities of Daily Living    07/22/2022    1:14 PM  In your present state of health, do you have any difficulty performing the following activities:  Hearing? 0  Vision? 0  Difficulty concentrating or making decisions? 0  Walking or climbing stairs? 0  Dressing or bathing? 0  Doing errands, shopping? 0  Preparing Food and eating ? N  Using the Toilet? N  In the past six months, have you accidently leaked urine? N  Do you have problems with loss of bowel control? N  Managing your Medications? N  Managing your Finances? N  Housekeeping or managing your Housekeeping? N    Patient Care Team: Joaquim Nam, MD as PCP - General (Family Medicine) Jake Bathe, MD as PCP - Cardiology (Cardiology) Ernesto Rutherford, MD as Consulting Physician (Ophthalmology) Cherlyn Roberts, MD as Consulting Physician (Dermatology) Gean Birchwood, MD as Consulting Physician (Orthopedic Surgery) Heloise Purpura, MD as Consulting Physician (Urology) Geryl Councilman, DDS as Referring Physician (Dentistry) Vilinda Flake, [facility] as Pharmacist (Pharmacist) Kathyrn Sheriff, [facility] as Pharmacist (Pharmacist)  Indicate any recent Medical Services you may have received from other than [facility] providers in the past year (date may be approximate).     Assessment:   This is a routine wellness examination for Romero.  Hearing/Vision screen Hearing Screening - Comments:: Aids Vision Screening - Comments:: Glasses - Dr.Groat  Dietary issues and exercise activities discussed: Current Exercise Habits: Home exercise routine, Type of exercise: walking, Time  (Minutes): 30, Frequency (Times/Week): 7, Weekly Exercise (Minutes/Week): 210, Intensity: Mild, Exercise limited by: None identified   Goals Addressed             This Visit's Progress    Patient Stated       No new goals       Depression Screen    07/22/2022    1:12 PM 07/19/2021   11:01 AM 07/18/2020   10:31 AM 01/03/2020   11:10 AM 10/25/2018   10:30 AM 12/25/2017   10:59 AM 12/17/2016    9:39 AM  PHQ 2/9 Scores  PHQ - 2 Score 0 0 0 0 0 0 0  PHQ- 9 Score   0   0 0    Fall Risk    07/22/2022    1:08 PM 07/19/2021   11:03 AM 07/18/2020   10:31 AM 01/03/2020   11:10 AM 10/25/2018   10:30 AM  Fall Risk   Falls in the past year? 1 0 0 0 0  Number falls in past yr: 1 0 0 0   Injury with Fall? 0 0 0 0   Risk for fall due to : Impaired balance/gait No Fall Risks Medication side effect    Follow up Falls evaluation completed;Falls prevention discussed;Education provided Falls evaluation completed Falls evaluation completed;Falls prevention discussed Falls evaluation completed     FALL RISK PREVENTION PERTAINING TO THE  HOME:  Any stairs in or around the home? No  If so, are there any without handrails? No  Home free of loose throw rugs in walkways, pet beds, electrical cords, etc? Yes  Adequate lighting in your home to reduce risk of falls? Yes   ASSISTIVE DEVICES UTILIZED TO PREVENT FALLS:  Life alert? No  Use of a cane, walker or w/c? Yes  Grab bars in the bathroom? Yes  Shower chair or bench in shower? Yes  Elevated toilet seat or a handicapped toilet? Yes     Cognitive Function:    07/18/2020   10:34 AM 12/25/2017   11:01 AM 12/17/2016    9:40 AM 12/17/2015   11:01 AM 05/04/2014   10:14 AM  MMSE - Mini Mental State Exam  Orientation to time 5 5 5 5 4   Orientation to Place 5 5 5 5 5   Registration 3 3 3 3 3   Attention/ Calculation 5 0 0 0 0  Recall 3 3 3 2 3   Recall-comments    pt was unable to recall 1 of 3 words   Language- name 2 objects  0 0 0 2  Language-  repeat 1 1 1 1 1   Language- follow 3 step command  3 2 3 3   Language- follow 3 step command-comments   unable to follow 1 step of 3 step command    Language- read & follow direction  0 0 0 1  Write a sentence  0 0 0 1  Copy design  0 0 0 0  Total score  20 19 19 23         07/22/2022    1:14 PM  6CIT Screen  What Year? 0 points  What month? 0 points  What time? 0 points  Count back from 20 4 points  Months in reverse 4 points  Repeat phrase 2 points  Total Score 10 points    Immunizations Immunization History  Administered Date(s) Administered   Fluad Quad(high Dose 65+) 10/25/2018, 01/03/2020, 01/07/2021   Influenza Split 11/30/2010, 11/24/2011   Influenza Whole 03/27/2009   Influenza, High Dose Seasonal PF 12/05/2014, 12/17/2016, 12/25/2017, 11/15/2021   Influenza,inj,Quad PF,6+ Mos 12/29/2012   Influenza-Unspecified 11/10/2013   Moderna Sars-Covid-2 Vaccination 04/21/2019, 05/23/2019, 12/20/2019, 06/07/2020   Pneumococcal Conjugate-13 11/10/2013   Pneumococcal Polysaccharide-23 09/06/2004   Rsv, Bivalent, Protein Subunit Rsvpref,pf Verdis Frederickson) 11/15/2021   Td 02/12/2009   Tdap 10/09/2011, 08/15/2014, 05/06/2022   Zoster, Live 07/28/2013    TDAP status: Up to date  Flu Vaccine status: Up to date  Pneumococcal vaccine status: Up to date  Covid-19 vaccine status: Information provided on how to obtain vaccines.   Qualifies for Shingles Vaccine? Yes   Zostavax completed Yes   Shingrix Completed?: No.    Education has been provided regarding the importance of this vaccine. Patient has been advised to call insurance company to determine out of pocket expense if they have not yet received this vaccine. Advised may also receive vaccine at local pharmacy or Health Dept. Verbalized acceptance and understanding.  Screening Tests Health Maintenance  Topic Date Due   Zoster Vaccines- Shingrix (1 of 2) Never done   COVID-19 Vaccine (5 - 2023-24 season) 10/11/2021   FOOT EXAM   07/27/2022   HEMOGLOBIN A1C  07/29/2022   OPHTHALMOLOGY EXAM  08/08/2022   INFLUENZA VACCINE  09/11/2022   Diabetic kidney evaluation - Urine ACR  01/28/2023   Diabetic kidney evaluation - eGFR measurement  05/06/2023   Medicare Annual Wellness (AWV)  07/22/2023   DTaP/Tdap/Td (5 - Td or Tdap) 05/05/2032   Pneumonia Vaccine 81+ Years old  Completed   HPV VACCINES  Aged Out    Health Maintenance  Health Maintenance Due  Topic Date Due   Zoster Vaccines- Shingrix (1 of 2) Never done   COVID-19 Vaccine (5 - 2023-24 season) 10/11/2021    Colorectal cancer screening: No longer required.   Lung Cancer Screening: (Low Dose CT Chest recommended if Age 55-80 years, 30 pack-year currently smoking OR have quit w/in 15years.) does not qualify.   Lung Cancer Screening Referral: no  Additional Screening:  Hepatitis C Screening: does not qualify; Completed no  Vision Screening: Recommended annual ophthalmology exams for early detection of glaucoma and other disorders of the eye. Is the patient up to date with their annual eye exam?  Yes  Who is the provider or what is the name of the office in which the patient attends annual eye exams? Dr.Groat If pt is not established with a provider, would they like to be referred to a provider to establish care? Yes .   Dental Screening: Recommended annual dental exams for proper oral hygiene  Community Resource Referral / Chronic Care Management: CRR required this visit?  No   CCM required this visit?  No      Plan:     I have personally reviewed and noted the following in the patient's chart:   Medical and social history Use of alcohol, tobacco or illicit drugs  Current medications and supplements including opioid prescriptions. Patient is currently taking opioid prescriptions. Information provided to patient regarding non-opioid alternatives. Patient advised to discuss non-opioid treatment plan with their provider. Functional ability and  status Nutritional status Physical activity Advanced directives List of other physicians Hospitalizations, surgeries, and ER visits in previous 12 months Vitals Screenings to include cognitive, depression, and falls Referrals and appointments  In addition, I have reviewed and discussed with patient certain preventive protocols, quality metrics, and best practice recommendations. A written personalized care plan for preventive services as well as general preventive health recommendations were provided to patient.     Maryan Puls, LPN   1/61/0960   Nurse Notes: 6CIT score 10

## 2022-07-23 ENCOUNTER — Other Ambulatory Visit (INDEPENDENT_AMBULATORY_CARE_PROVIDER_SITE_OTHER): Payer: Medicare Other

## 2022-07-23 DIAGNOSIS — E1159 Type 2 diabetes mellitus with other circulatory complications: Secondary | ICD-10-CM

## 2022-07-23 DIAGNOSIS — E538 Deficiency of other specified B group vitamins: Secondary | ICD-10-CM | POA: Diagnosis not present

## 2022-07-23 LAB — COMPREHENSIVE METABOLIC PANEL
ALT: 18 U/L (ref 0–53)
AST: 16 U/L (ref 0–37)
Albumin: 3.9 g/dL (ref 3.5–5.2)
Alkaline Phosphatase: 74 U/L (ref 39–117)
BUN: 22 mg/dL (ref 6–23)
CO2: 29 mEq/L (ref 19–32)
Calcium: 9.8 mg/dL (ref 8.4–10.5)
Chloride: 99 mEq/L (ref 96–112)
Creatinine, Ser: 1.35 mg/dL (ref 0.40–1.50)
GFR: 47.85 mL/min — ABNORMAL LOW (ref >60.00)
Glucose, Bld: 302 mg/dL — ABNORMAL HIGH (ref 70–99)
Potassium: 3.7 mEq/L (ref 3.5–5.1)
Sodium: 137 mEq/L (ref 135–145)
Total Bilirubin: 0.5 mg/dL (ref 0.2–1.2)
Total Protein: 6.5 g/dL (ref 6.0–8.3)

## 2022-07-23 LAB — LIPID PANEL
Cholesterol: 162 mg/dL (ref 0–200)
HDL: 32.7 mg/dL — ABNORMAL LOW (ref >39.00)
NonHDL: 128.95
Total CHOL/HDL Ratio: 5
Triglycerides: 284 mg/dL — ABNORMAL HIGH (ref 0.0–149.0)
VLDL: 56.8 mg/dL — ABNORMAL HIGH (ref 0.0–40.0)

## 2022-07-23 LAB — TSH: TSH: 4.35 u[IU]/mL (ref 0.35–5.50)

## 2022-07-23 LAB — LDL CHOLESTEROL, DIRECT: Direct LDL: 103 mg/dL

## 2022-07-23 LAB — VITAMIN B12: Vitamin B-12: 750 pg/mL (ref 211–911)

## 2022-07-23 LAB — HEMOGLOBIN A1C: Hgb A1c MFr Bld: 9.6 % — ABNORMAL HIGH (ref 4.6–6.5)

## 2022-07-29 ENCOUNTER — Ambulatory Visit (INDEPENDENT_AMBULATORY_CARE_PROVIDER_SITE_OTHER): Payer: Medicare Other | Admitting: Family Medicine

## 2022-07-29 ENCOUNTER — Encounter: Payer: Self-pay | Admitting: Family Medicine

## 2022-07-29 VITALS — BP 128/60 | HR 76 | Temp 98.3°F | Ht 72.0 in | Wt 220.0 lb

## 2022-07-29 DIAGNOSIS — Z7189 Other specified counseling: Secondary | ICD-10-CM | POA: Diagnosis not present

## 2022-07-29 DIAGNOSIS — Z794 Long term (current) use of insulin: Secondary | ICD-10-CM | POA: Diagnosis not present

## 2022-07-29 DIAGNOSIS — Z85820 Personal history of malignant melanoma of skin: Secondary | ICD-10-CM | POA: Diagnosis not present

## 2022-07-29 DIAGNOSIS — E1159 Type 2 diabetes mellitus with other circulatory complications: Secondary | ICD-10-CM | POA: Diagnosis not present

## 2022-07-29 DIAGNOSIS — Z0000 Encounter for general adult medical examination without abnormal findings: Secondary | ICD-10-CM | POA: Diagnosis not present

## 2022-07-29 DIAGNOSIS — R413 Other amnesia: Secondary | ICD-10-CM | POA: Diagnosis not present

## 2022-07-29 DIAGNOSIS — G720 Drug-induced myopathy: Secondary | ICD-10-CM | POA: Diagnosis not present

## 2022-07-29 DIAGNOSIS — I10 Essential (primary) hypertension: Secondary | ICD-10-CM | POA: Diagnosis not present

## 2022-07-29 DIAGNOSIS — C439 Malignant melanoma of skin, unspecified: Secondary | ICD-10-CM

## 2022-07-29 MED ORDER — TOUJEO SOLOSTAR 300 UNIT/ML ~~LOC~~ SOPN: PEN_INJECTOR | SUBCUTANEOUS | 3 refills | Status: DC

## 2022-07-29 NOTE — Patient Instructions (Signed)
Recheck A1c at a visit in about 3 months.  We can recheck your memory at that point.  Would try 15 units at night in the meantime.  Update me as needed. Take care.  Glad to see you.

## 2022-07-29 NOTE — Progress Notes (Unsigned)
Diabetes:  Using medications without difficulties: yes Hypoglycemic episodes: no Hyperglycemic episodes: no Feet problems: no Blood Sugars averaging: 100-200, variable with diet.   eye exam within last year: yes Labs d/w pt.    Elevated Cholesterol: Statin intolerant.  Labs discussed with patient.  Memory d/w pt.  B12 wnl, on replacement.  Here today with his sister.  She had noted occasional lapses in his memory.  See exam below.  H/o melanoma.  Has seen seen derm.  Benign appearing SK and nevi on the back.  Refer back to derm based on hx.    Flu vaccine - prev done.  covid vaccine prev done.  Shingles d/w pt.   RSV prev done.  PNA and Tetanus up to date.  PSA and colon CA screening declined due to age.   Living will d/w pt. Would have son designated first, then if needed sister Tyler Aas, if he were incapacitated.  Lives at home.  Fall cautions d/w pt.  D/w pt about routine safety.  Using a cane at baseline.  He wasn't lightheaded.  Offered PT eval, he'll consider.    Hypertension:    Using medication without problems or lightheadedness: yes Chest pain with exertion: no Edema: no Short of breath: no Labs: d/w pt.    PMH and SH reviewed  Meds, vitals, and allergies reviewed.   ROS: Per HPI unless specifically indicated in ROS section   GEN: nad, alert and oriented HEENT: ncat NECK: supple w/o LA CV: rrr. PULM: ctab, no inc wob ABD: soft, +bs EXT: no edema SKIN: benign appearing SKs and nevi on the back.    Diabetic foot exam: Normal inspection No skin breakdown No calluses  Normal DP pulses Normal sensation to light touch and monofilament Nails thickened.    MMSE 26/29.  He declined writing a sentence so he did not have the full 30 point exam.  -1 for attention calculation and pentagon drawing, thus 26 out of 29.  40 minutes were devoted to patient care in this encounter (this includes time spent reviewing the patient's file/history, interviewing and examining  the patient, counseling/reviewing plan with patient).

## 2022-07-31 DIAGNOSIS — R413 Other amnesia: Secondary | ICD-10-CM | POA: Insufficient documentation

## 2022-07-31 NOTE — Assessment & Plan Note (Signed)
Recheck A1c at a visit in about 3 months.  Would gradually increase his basal insulin to 15 units at night in the meantime.  Update me as needed in the meantime.

## 2022-07-31 NOTE — Assessment & Plan Note (Signed)
Statin intolerant 

## 2022-07-31 NOTE — Assessment & Plan Note (Signed)
B12 wnl, on replacement.  Here today with his sister.  She had noted occasional lapses in his memory.  See exam below.  MMSE 26/29.  He declined writing a sentence so he did not have the full 30 point exam.  -1 for attention calculation and pentagon drawing, thus 26 out of 29.  We talked about options, safety.  He wanted to observe.  We can recheck his memory at a follow-up visit in 3 months, sooner if needed.

## 2022-07-31 NOTE — Assessment & Plan Note (Signed)
History of melanoma.  Refer back to dermatology.

## 2022-07-31 NOTE — Assessment & Plan Note (Signed)
Living will d/w pt. Would have son designated first, then if needed sister Doris, if he were incapacitated.  

## 2022-07-31 NOTE — Assessment & Plan Note (Signed)
Continue amlodipine carvedilol hydralazine lisinopril hydrochlorothiazide.

## 2022-07-31 NOTE — Assessment & Plan Note (Signed)
Flu vaccine - prev done.  covid vaccine prev done.  Shingles d/w pt.   RSV prev done.  PNA and Tetanus up to date.  PSA and colon CA screening declined due to age.   Living will d/w pt. Would have son designated first, then if needed sister Tyler Aas, if he were incapacitated.  Lives at home.  Fall cautions d/w pt.  D/w pt about routine safety.  Using a cane at baseline.  He wasn't lightheaded.  Offered PT eval, he'll consider.

## 2022-08-13 DIAGNOSIS — H2512 Age-related nuclear cataract, left eye: Secondary | ICD-10-CM | POA: Diagnosis not present

## 2022-08-13 DIAGNOSIS — Z961 Presence of intraocular lens: Secondary | ICD-10-CM | POA: Diagnosis not present

## 2022-08-13 DIAGNOSIS — E119 Type 2 diabetes mellitus without complications: Secondary | ICD-10-CM | POA: Diagnosis not present

## 2022-08-13 DIAGNOSIS — H40013 Open angle with borderline findings, low risk, bilateral: Secondary | ICD-10-CM | POA: Diagnosis not present

## 2022-08-13 LAB — HM DIABETES EYE EXAM

## 2022-08-31 ENCOUNTER — Other Ambulatory Visit: Payer: Self-pay | Admitting: Family Medicine

## 2022-10-20 ENCOUNTER — Other Ambulatory Visit: Payer: Self-pay | Admitting: Family Medicine

## 2022-11-17 ENCOUNTER — Emergency Department (HOSPITAL_COMMUNITY)
Admission: EM | Admit: 2022-11-17 | Discharge: 2022-11-17 | Disposition: A | Discharge status: Home / Self Care | Payer: Medicare Other | Attending: Emergency Medicine | Admitting: Emergency Medicine

## 2022-11-17 ENCOUNTER — Emergency Department (HOSPITAL_COMMUNITY): Payer: Medicare Other

## 2022-11-17 ENCOUNTER — Encounter (HOSPITAL_COMMUNITY): Payer: Self-pay

## 2022-11-17 ENCOUNTER — Other Ambulatory Visit: Payer: Self-pay

## 2022-11-17 DIAGNOSIS — R0689 Other abnormalities of breathing: Secondary | ICD-10-CM | POA: Diagnosis not present

## 2022-11-17 DIAGNOSIS — R0602 Shortness of breath: Secondary | ICD-10-CM | POA: Insufficient documentation

## 2022-11-17 DIAGNOSIS — R231 Pallor: Secondary | ICD-10-CM | POA: Diagnosis not present

## 2022-11-17 DIAGNOSIS — Z79899 Other long term (current) drug therapy: Secondary | ICD-10-CM | POA: Diagnosis not present

## 2022-11-17 DIAGNOSIS — R0600 Dyspnea, unspecified: Secondary | ICD-10-CM

## 2022-11-17 DIAGNOSIS — E11649 Type 2 diabetes mellitus with hypoglycemia without coma: Secondary | ICD-10-CM | POA: Diagnosis not present

## 2022-11-17 DIAGNOSIS — E162 Hypoglycemia, unspecified: Secondary | ICD-10-CM | POA: Insufficient documentation

## 2022-11-17 DIAGNOSIS — I2510 Atherosclerotic heart disease of native coronary artery without angina pectoris: Secondary | ICD-10-CM | POA: Diagnosis not present

## 2022-11-17 DIAGNOSIS — Z20822 Contact with and (suspected) exposure to covid-19: Secondary | ICD-10-CM | POA: Insufficient documentation

## 2022-11-17 DIAGNOSIS — R739 Hyperglycemia, unspecified: Secondary | ICD-10-CM | POA: Diagnosis not present

## 2022-11-17 DIAGNOSIS — I10 Essential (primary) hypertension: Secondary | ICD-10-CM | POA: Insufficient documentation

## 2022-11-17 DIAGNOSIS — R000 Tachycardia, unspecified: Secondary | ICD-10-CM | POA: Diagnosis not present

## 2022-11-17 DIAGNOSIS — Z85820 Personal history of malignant melanoma of skin: Secondary | ICD-10-CM | POA: Insufficient documentation

## 2022-11-17 DIAGNOSIS — Z7982 Long term (current) use of aspirin: Secondary | ICD-10-CM | POA: Insufficient documentation

## 2022-11-17 DIAGNOSIS — R42 Dizziness and giddiness: Secondary | ICD-10-CM | POA: Diagnosis not present

## 2022-11-17 DIAGNOSIS — I700 Atherosclerosis of aorta: Secondary | ICD-10-CM | POA: Diagnosis not present

## 2022-11-17 LAB — CBC
HCT: 42.7 % (ref 39.0–52.0)
Hemoglobin: 14.1 g/dL (ref 13.0–17.0)
MCH: 31 pg (ref 26.0–34.0)
MCHC: 33 g/dL (ref 30.0–36.0)
MCV: 93.8 fL (ref 80.0–100.0)
Platelets: 365 10*3/uL (ref 150–400)
RBC: 4.55 MIL/uL (ref 4.22–5.81)
RDW: 13.4 % (ref 11.5–15.5)
WBC: 7.6 10*3/uL (ref 4.0–10.5)
nRBC: 0 % (ref 0.0–0.2)

## 2022-11-17 LAB — RESP PANEL BY RT-PCR (RSV, FLU A&B, COVID)  RVPGX2
Influenza A by PCR: NEGATIVE
Influenza B by PCR: NEGATIVE
Resp Syncytial Virus by PCR: NEGATIVE
SARS Coronavirus 2 by RT PCR: NEGATIVE

## 2022-11-17 LAB — URINALYSIS, ROUTINE W REFLEX MICROSCOPIC
Bilirubin Urine: NEGATIVE
Glucose, UA: >=500 mg/dL — AB
Hgb urine dipstick: NEGATIVE
Ketones, ur: NEGATIVE mg/dL
Nitrite: NEGATIVE
Protein, ur: 30 mg/dL — AB
Specific Gravity, Urine: 1.009 (ref 1.005–1.030)
pH: 7 (ref 5.0–8.0)

## 2022-11-17 LAB — BRAIN NATRIURETIC PEPTIDE: B Natriuretic Peptide: 132.4 pg/mL — ABNORMAL HIGH (ref 0.0–100.0)

## 2022-11-17 LAB — CBG MONITORING, ED
Glucose-Capillary: 65 mg/dL — ABNORMAL LOW (ref 70–99)
Glucose-Capillary: 109 mg/dL — ABNORMAL HIGH (ref 70–99)

## 2022-11-17 LAB — COMPREHENSIVE METABOLIC PANEL
ALT: 15 U/L (ref 0–44)
AST: 15 U/L (ref 15–41)
Albumin: 3.9 g/dL (ref 3.5–5.0)
Alkaline Phosphatase: 66 U/L (ref 38–126)
Anion gap: 9 (ref 5–15)
BUN: 26 mg/dL — ABNORMAL HIGH (ref 8–23)
CO2: 25 mmol/L (ref 22–32)
Calcium: 9.8 mg/dL (ref 8.9–10.3)
Chloride: 103 mmol/L (ref 98–111)
Creatinine, Ser: 1.2 mg/dL (ref 0.61–1.24)
GFR, Estimated: 59 mL/min — ABNORMAL LOW (ref >60)
Glucose, Bld: 78 mg/dL (ref 70–99)
Potassium: 3.2 mmol/L — ABNORMAL LOW (ref 3.5–5.1)
Sodium: 137 mmol/L (ref 135–145)
Total Bilirubin: 0.5 mg/dL (ref 0.3–1.2)
Total Protein: 7.2 g/dL (ref 6.5–8.1)

## 2022-11-17 LAB — TROPONIN I (HIGH SENSITIVITY)
Troponin I (High Sensitivity): 17 ng/L (ref <18)
Troponin I (High Sensitivity): 17 ng/L (ref <18)

## 2022-11-17 LAB — D-DIMER, QUANTITATIVE: D-Dimer, Quant: 0.69 ug{FEU}/mL — ABNORMAL HIGH (ref 0.00–0.50)

## 2022-11-17 MED ORDER — POTASSIUM CHLORIDE CRYS ER 20 MEQ PO TBCR
40.0000 meq | EXTENDED_RELEASE_TABLET | Freq: Once | ORAL | Status: AC
  Administered 2022-11-17: 40 meq via ORAL
  Filled 2022-11-17: qty 2

## 2022-11-17 NOTE — Discharge Instructions (Signed)
The test today in the ED were reassuring other than your low blood sugar.  That improved while you are in the emergency room.  Continue to monitor your sugar.  Follow-up with your doctor later this week to be rechecked if your symptoms have not resolved.  Return to the ER for any recurrent symptoms.

## 2022-11-17 NOTE — ED Triage Notes (Signed)
EMS reports from home, called out for sudden onset of dizziness. Hx of a-fib.  BP 154/85 HR 87 RR 38 Sp02 97 RA CBG 154  18ga R forearm 4mg  Zofran enroute.

## 2022-11-17 NOTE — ED Provider Notes (Signed)
[place] EMERGENCY DEPARTMENT AT [facility] Provider Note   CSN: [id number] Arrival date & time: 11/17/22  1740     History  Chief Complaint  Patient presents with   Shortness of Breath    [name] is a 88 y.o. male.   Shortness of Breath    Patient has a history of hypertension coronary artery disease, anxiety, BPH, melanoma, tachycardia, drug-induced myopathy who presents ED with complaints of shortness of breath.  Patient states he was at home today.  He noticed his blood sugar was elevated, his blood pressure was high and he was feeling short of breath.  Has not really had any trouble with any chest pain or abdominal pain.  He did not really notice any leg swelling.  He denies any recent fevers or chills.  He has been urinating frequently but that is not unusual for him  Home Medications Prior to Admission medications   Medication Sig Start Date End Date Taking? Authorizing Provider  Accu-Chek Softclix Lancets lancets TEST BLOOD SUGAR UP TO THREE TIMES DAILY 01/23/20   Joaquim Nam, MD  amLODipine (NORVASC) 10 MG tablet TAKE 1 TABLET BY MOUTH IN THE  MORNING 06/23/22   Joaquim Nam, MD  aspirin EC 81 MG tablet Take 81 mg by mouth every evening.    [provider]  Blood Glucose Calibration (ACCU-CHEK AVIVA) SOLN Use as directed monthly. E11.59  Insulin dependent 12/06/18   Joaquim Nam, MD  Blood Glucose Monitoring Suppl (ACCU-CHEK AVIVA PLUS) w/Device KIT Check sugar up to 3 times daily. E11.59  Insulin dependent 11/30/18   Joaquim Nam, MD  carvedilol (COREG) 12.5 MG tablet TAKE 1 TABLET BY MOUTH TWICE  DAILY WITH MEALS 06/23/22   Joaquim Nam, MD  COD LIVER OIL PO Take 1 capsule by mouth 2 (two) times daily.     [provider]  Continuous Glucose Receiver (FREESTYLE LIBRE 3 READER) DEVI 1 each by Does not apply route as directed. 05/28/22   Joaquim Nam, MD  Continuous Glucose Sensor (FREESTYLE LIBRE 3 SENSOR) MISC  Place 1 sensor on the skin every 14 days. Use to check glucose continuously 05/28/22   Joaquim Nam, MD  cyanocobalamin (,VITAMIN B-12,) 1000 MCG/ML injection Inject 1 mL (1,000 mcg total) into the muscle every 30 (thirty) days. Dispense 3 of the 1 mL vials. 10/29/18   Joaquim Nam, MD  DROPLET PEN NEEDLES 30G X 8 MM MISC USE  WITH  INSULIN  PEN 02/03/19   Joaquim Nam, MD  glucose blood (ONETOUCH ULTRA) test strip USE TO CHECK UP TO 3 TIMES DAILY AS NEEDED 09/01/22   Joaquim Nam, MD  HUMALOG KWIKPEN 100 UNIT/ML KwikPen INJECT SUBCUTANEOUSLY 4-10 UNITS 3 TIMES DAILY BEFORE MEALS PER  SLIDING SCALE: 151-200 = 4 UN,  201-250 = 6 UN, 251-300 = 8 UN,  AND OVER 300 = 10 UN 10/21/22   Joaquim Nam, MD  hydrALAZINE (APRESOLINE) 50 MG tablet TAKE 1 TABLET BY MOUTH 3 TIMES  DAILY 06/23/22   Joaquim Nam, MD  insulin glargine, 1 Unit Dial, ([name] SOLOSTAR) 300 UNIT/ML Solostar Pen INJECT SUBCUTANEOUSLY 15  UNITS AT BEDTIME 07/29/22   Joaquim Nam, MD  lisinopril-hydrochlorothiazide (ZESTORETIC) 20-25 MG tablet TAKE 1 TABLET BY MOUTH IN THE  MORNING 06/23/22   Joaquim Nam, MD  potassium chloride SA (KLOR-CON M) 20 MEQ tablet TAKE 1 TABLET BY MOUTH DAILY 06/23/22   Joaquim Nam,  MD  SYRINGE-NEEDLE, DISP, 3 ML (B-D 3CC LUER-LOK SYR 25GX1") 25G X 1" 3 ML MISC USE TO INJECT VITAMIN B12 MONTHLY. 08/22/19   Joaquim Nam, MD  tamsulosin (FLOMAX) 0.4 MG CAPS capsule TAKE 1 CAPSULE BY MOUTH DAILY  AFTER SUPPER 03/19/22   Joaquim Nam, MD  traMADol (ULTRAM) 50 MG tablet TAKE 1 TABLET BY MOUTH EVERY 12 HOURS AS NEEDED FOR KNEE PAIN. 07/11/22   Joaquim Nam, MD      Allergies    Morphine, Atorvastatin, Codeine phosphate, and Metformin and related    Review of Systems   Review of Systems  Respiratory:  Positive for shortness of breath.     Physical Exam Updated Vital Signs BP (!) 143/63   Pulse 70   Temp 97.8 F (36.6 C)   Resp 17   SpO2 92%  Physical Exam Vitals and  nursing note reviewed.  Constitutional:      Appearance: He is well-developed. He is not diaphoretic.  HENT:     Head: Normocephalic and atraumatic.     Right Ear: External ear normal.     Left Ear: External ear normal.  Eyes:     General: No scleral icterus.       Right eye: No discharge.        Left eye: No discharge.     Conjunctiva/sclera: Conjunctivae normal.  Neck:     Trachea: No tracheal deviation.  Cardiovascular:     Rate and Rhythm: Normal rate and regular rhythm.  Pulmonary:     Effort: Pulmonary effort is normal. No respiratory distress.     Breath sounds: Normal breath sounds. No stridor. No wheezing, rhonchi or rales.  Abdominal:     General: Bowel sounds are normal. There is no distension.     Palpations: Abdomen is soft.     Tenderness: There is no abdominal tenderness. There is no guarding or rebound.  Musculoskeletal:        General: No tenderness or deformity.     Cervical back: Neck supple.     Right lower leg: No edema.     Left lower leg: No edema.  Skin:    General: Skin is warm and dry.     Findings: No rash.  Neurological:     General: No focal deficit present.     Mental Status: He is alert.     Cranial Nerves: No cranial nerve deficit, dysarthria or facial asymmetry.     Sensory: No sensory deficit.     Motor: No abnormal muscle tone or seizure activity.     Coordination: Coordination normal.  Psychiatric:        Mood and Affect: Mood normal.     ED Results / Procedures / Treatments   Labs (all labs ordered are listed, but only abnormal results are displayed) Labs Reviewed  COMPREHENSIVE METABOLIC PANEL - Abnormal; Notable for the following components:      Result Value   Potassium 3.2 (*)    BUN 26 (*)    GFR, Estimated 59 (*)    All other components within normal limits  BRAIN NATRIURETIC PEPTIDE - Abnormal; Notable for the following components:   B Natriuretic Peptide 132.4 (*)    All other components within normal limits   D-DIMER, QUANTITATIVE - Abnormal; Notable for the following components:   D-Dimer, Quant 0.69 (*)    All other components within normal limits  URINALYSIS, ROUTINE W REFLEX MICROSCOPIC - Abnormal; Notable for the following components:  Color, Urine STRAW (*)    Glucose, UA >=500 (*)    Protein, ur 30 (*)    Leukocytes,Ua TRACE (*)    Bacteria, UA FEW (*)    All other components within normal limits  CBG MONITORING, ED - Abnormal; Notable for the following components:   Glucose-Capillary 65 (*)    All other components within normal limits  CBG MONITORING, ED - Abnormal; Notable for the following components:   Glucose-Capillary 109 (*)    All other components within normal limits  RESP PANEL BY RT-PCR (RSV, FLU A&B, COVID)  RVPGX2  CBC  TROPONIN I (HIGH SENSITIVITY)  TROPONIN I (HIGH SENSITIVITY)    EKG EKG Interpretation Date/Time:  Monday November 17 2022 18:07:45 EDT Ventricular Rate:  74 PR Interval:  181 QRS Duration:  111 QT Interval:  399 QTC Calculation: 443 R Axis:   32  Text Interpretation: Sinus rhythm Atrial premature complex Borderline repolarization abnormality , new since last tracing Confirmed by Linwood Dibbles [phone]) on 11/17/2022 6:11:43 PM  Radiology DG Chest Port 1 View  Result Date: 11/17/2022 CLINICAL DATA:  Dyspnea.  Dizziness. EXAM: PORTABLE CHEST 1 VIEW COMPARISON:  04/02/2020 FINDINGS: The cardiomediastinal contours are normal. Aortic atherosclerosis. Pulmonary vasculature is normal. No consolidation, pleural effusion, or pneumothorax. No acute osseous abnormalities are seen. Surgical clips in the left axilla. IMPRESSION: No active disease. Electronically Signed   By: Narda Rutherford M.D.   On: 11/17/2022 18:46    Procedures Procedures    Medications Ordered in ED Medications  potassium chloride SA (KLOR-CON M) CR tablet 40 mEq (40 mEq Oral Given 11/17/22 1936)    ED Course/ Medical Decision Making/ A&P Clinical Course as of 11/17/22 2050  Mon  Nov 17, 2022  1911 D-dimer, quantitative(!) D-dimer increased at 0.69 within age-adjusted normal range.  BNP slightly increased at 132. [JK]  1911 Urinalysis does show trace leukocyte esterase 6-10 whites few bacteria not definitive for infection [JK]  1911 CBC normal. [JK]  1911 Metabolic panel shows slightly decreased potassium [JK]  1911 Chest x-ray without acute findings [JK]  1942 Blood sugar improved to 109 [JK]  2047 Oxygen saturations remained stable.  Patient not requiring supplemental oxygen [JK]    Clinical Course User Index [JK] Linwood Dibbles, MD                                 Medical Decision Making Differential diagnosis includes but not limited to pneumonia, CHF, ACS, dehydration, anemia  Problems Addressed: Dyspnea, unspecified type: acute illness or injury that poses a threat to life or bodily functions Hypoglycemia: acute illness or injury that poses a threat to life or bodily functions  Amount and/or Complexity of Data Reviewed Labs: ordered. Decision-making details documented in ED Course. Radiology: ordered and independent interpretation performed.  Risk Prescription drug management.   Patient's ED workup is reassuring.  Troponins are normal.  Doubt ACS.  Patient is negative for COVID influenza.  Chest x-ray does not show pneumonia.  D-dimer slightly elevated but age-adjusted normal range doubt PE.  Slight elevation in BNP but no other findings to suggest CHF exacerbation.  X-ray is otherwise unremarkable.  Patient does not have a leukocytosis.  No signs of severe anemia.  He did have decreased blood sugars in the 60s and he was symptomatic at that time.  Patient was given fluid and food and his sugar improved.  At this time no signs of severe  infection pulmonary embolism or acute coronary syndrome.   Patient was monitored in the ED.  He has normal vital signs no dysrhythmia.  Evaluation and diagnostic testing in the emergency department does not suggest an emergent  condition requiring admission or immediate intervention beyond what has been performed at this time.  The patient is safe for discharge and has been instructed to return immediately for worsening symptoms, change in symptoms or any other concerns.       Final Clinical Impression(s) / ED Diagnoses Final diagnoses:  Dyspnea, unspecified type  Hypoglycemia    Rx / DC Orders ED Discharge Orders     None         Linwood Dibbles, MD 11/17/22 2050

## 2022-11-19 ENCOUNTER — Other Ambulatory Visit: Payer: Self-pay | Admitting: Family Medicine

## 2022-11-19 NOTE — Telephone Encounter (Signed)
Please schedule Dm2 f/u.  We can do labs at the visit.  Rx sent.  Thanks.

## 2022-11-19 NOTE — Telephone Encounter (Signed)
Patient notified of refill, scheduled for 11/18 for dm f/u

## 2022-11-19 NOTE — Telephone Encounter (Signed)
LVM for patient to c/b and schedule.  

## 2022-12-04 ENCOUNTER — Other Ambulatory Visit: Payer: Self-pay | Admitting: Family Medicine

## 2022-12-05 ENCOUNTER — Other Ambulatory Visit: Payer: Self-pay | Admitting: Family Medicine

## 2022-12-17 ENCOUNTER — Telehealth: Payer: Self-pay

## 2022-12-17 NOTE — Telephone Encounter (Signed)
Transition Care Management Unsuccessful Follow-up Telephone Call  Date of discharge and from where:  [name] 10/6  Attempts:  1st Attempt  Reason for unsuccessful TCM follow-up call:  No answer/busy   [name]  [facility], [facility] Guide, Phone: [phone] Website: [name]

## 2022-12-18 ENCOUNTER — Telehealth: Payer: Self-pay

## 2022-12-18 NOTE — Telephone Encounter (Signed)
Transition Care Management Follow-up Telephone Call Date of discharge and from where: [facility] 10/7 How have you been since you were released from the hospital? PT declined call  Any questions or concerns?   Items Reviewed: Did the pt receive and understand the discharge instructions provide?  Medications obtained and verified?  Other?  Any new allergies since your discharge?  Dietary orders reviewed?  Do you have support at home?    Follow up appointments reviewed:  PCP Hospital f/u appt confirmed?  [place] to see  on  @ . Specialist Hospital f/u appt confirmed?  Scheduled to see  on  @ . Are transportation arrangements needed?  If their condition worsens, is the pt aware to call PCP or go to the Emergency Dept.?  Was the patient provided with contact information for the PCP's office or ED?  Was to pt encouraged to call back with questions or concerns?

## 2022-12-29 ENCOUNTER — Encounter: Payer: Self-pay | Admitting: Family Medicine

## 2022-12-29 ENCOUNTER — Ambulatory Visit (INDEPENDENT_AMBULATORY_CARE_PROVIDER_SITE_OTHER): Payer: Medicare Other | Admitting: Family Medicine

## 2022-12-29 VITALS — BP 136/70 | HR 75 | Temp 98.5°F | Ht 72.0 in | Wt 227.2 lb

## 2022-12-29 DIAGNOSIS — R413 Other amnesia: Secondary | ICD-10-CM | POA: Diagnosis not present

## 2022-12-29 DIAGNOSIS — M159 Polyosteoarthritis, unspecified: Secondary | ICD-10-CM | POA: Diagnosis not present

## 2022-12-29 DIAGNOSIS — Z794 Long term (current) use of insulin: Secondary | ICD-10-CM | POA: Diagnosis not present

## 2022-12-29 DIAGNOSIS — Z23 Encounter for immunization: Secondary | ICD-10-CM

## 2022-12-29 DIAGNOSIS — E1159 Type 2 diabetes mellitus with other circulatory complications: Secondary | ICD-10-CM

## 2022-12-29 LAB — POCT GLYCOSYLATED HEMOGLOBIN (HGB A1C): Hemoglobin A1C: 8.3 % — AB (ref 4.0–5.6)

## 2022-12-29 MED ORDER — TOUJEO SOLOSTAR 300 UNIT/ML ~~LOC~~ SOPN: PEN_INJECTOR | SUBCUTANEOUS | Status: DC

## 2022-12-29 NOTE — Progress Notes (Unsigned)
Diabetes:  Using medications without difficulties:yes Hypoglycemic episodes:no Hyperglycemic episodes:no Feet problems: no Blood Sugars averaging: 150-175 eye exam within last year: yes- he correctly recalled the date of his eye exam (08/2021) A1c 9.6----->8.3.  goal A1c ~8, d/w pt.  Goal to limit risk of low sugars.    He is putting up with arthritis at baseline.  Has used tramadol but still has pain.  He is using a cane.  No ADE on med.   Memory d/w pt.  Recheck today.  28/30 (-1 for attention and [employer] copying), similar to prev.  Not getting lost. Not leaving the stove on.  Cautions d/w pt.  He didn't notice a sig change.  D/w pt about observation.  We can recheck periodically.  Meds, vitals, and allergies reviewed.   ROS: Per HPI unless specifically indicated in ROS section   GEN: nad, alert and oriented HEENT: mucous membranes moist NECK: supple w/o LA CV: rrr. PULM: ctab, no inc wob ABD: soft, +bs EXT: trace BLE edema SKIN: well perfused.  Crepitus L knee with ROM

## 2022-12-29 NOTE — Patient Instructions (Addendum)
Recheck in March 2025- A1c at the visit.  You don't need to fast.  Take care.  Glad to see you. Thanks for your effort.

## 2022-12-31 ENCOUNTER — Other Ambulatory Visit: Payer: Self-pay

## 2022-12-31 MED ORDER — TOUJEO SOLOSTAR 300 UNIT/ML ~~LOC~~ SOPN: PEN_INJECTOR | SUBCUTANEOUS | 3 refills | Status: DC

## 2022-12-31 NOTE — Assessment & Plan Note (Signed)
Recheck today.  28/30 (-1 for attention and [employer] copying), similar to prev.  Not getting lost. Not leaving the stove on.  Cautions d/w pt.  He didn't notice a sig change.  D/w pt about observation.  We can recheck periodically.

## 2022-12-31 NOTE — Assessment & Plan Note (Signed)
Would continue with tramadol as is for now.  He is using a cane to limit his fall risk.

## 2022-12-31 NOTE — Assessment & Plan Note (Signed)
A1c 9.6----->8.3.  goal A1c ~8, d/w pt.  Goal to limit risk of low sugars.   Recheck in March 2025- A1c at the visit. Update me as needed in the meantime.  Continue Humalog, [name].

## 2023-01-01 DIAGNOSIS — R001 Bradycardia, unspecified: Secondary | ICD-10-CM | POA: Diagnosis not present

## 2023-01-01 DIAGNOSIS — R0689 Other abnormalities of breathing: Secondary | ICD-10-CM | POA: Diagnosis not present

## 2023-01-01 DIAGNOSIS — R000 Tachycardia, unspecified: Secondary | ICD-10-CM | POA: Diagnosis not present

## 2023-01-01 DIAGNOSIS — I10 Essential (primary) hypertension: Secondary | ICD-10-CM | POA: Diagnosis not present

## 2023-01-01 DIAGNOSIS — R42 Dizziness and giddiness: Secondary | ICD-10-CM | POA: Diagnosis not present

## 2023-01-02 ENCOUNTER — Encounter (HOSPITAL_COMMUNITY): Payer: Self-pay

## 2023-01-02 ENCOUNTER — Telehealth: Payer: Self-pay | Admitting: Family Medicine

## 2023-01-02 ENCOUNTER — Other Ambulatory Visit: Payer: Self-pay

## 2023-01-02 ENCOUNTER — Emergency Department (HOSPITAL_COMMUNITY)
Admission: EM | Admit: 2023-01-02 | Discharge: 2023-01-02 | Disposition: A | Discharge status: Home / Self Care | Payer: Medicare Other | Attending: Emergency Medicine | Admitting: Emergency Medicine

## 2023-01-02 DIAGNOSIS — E876 Hypokalemia: Secondary | ICD-10-CM | POA: Insufficient documentation

## 2023-01-02 DIAGNOSIS — R42 Dizziness and giddiness: Secondary | ICD-10-CM | POA: Insufficient documentation

## 2023-01-02 DIAGNOSIS — Z794 Long term (current) use of insulin: Secondary | ICD-10-CM | POA: Insufficient documentation

## 2023-01-02 DIAGNOSIS — I2510 Atherosclerotic heart disease of native coronary artery without angina pectoris: Secondary | ICD-10-CM | POA: Diagnosis not present

## 2023-01-02 DIAGNOSIS — Z7982 Long term (current) use of aspirin: Secondary | ICD-10-CM | POA: Insufficient documentation

## 2023-01-02 DIAGNOSIS — Z79899 Other long term (current) drug therapy: Secondary | ICD-10-CM | POA: Diagnosis not present

## 2023-01-02 DIAGNOSIS — E1165 Type 2 diabetes mellitus with hyperglycemia: Secondary | ICD-10-CM | POA: Diagnosis not present

## 2023-01-02 DIAGNOSIS — I10 Essential (primary) hypertension: Secondary | ICD-10-CM | POA: Insufficient documentation

## 2023-01-02 LAB — URINALYSIS, ROUTINE W REFLEX MICROSCOPIC
Bilirubin Urine: NEGATIVE
Glucose, UA: NEGATIVE mg/dL
Hgb urine dipstick: NEGATIVE
Ketones, ur: NEGATIVE mg/dL
Nitrite: NEGATIVE
Protein, ur: 100 mg/dL — AB
Specific Gravity, Urine: 1.006 (ref 1.005–1.030)
pH: 8 (ref 5.0–8.0)

## 2023-01-02 LAB — BASIC METABOLIC PANEL
Anion gap: 11 (ref 5–15)
BUN: 20 mg/dL (ref 8–23)
CO2: 23 mmol/L (ref 22–32)
Calcium: 10.1 mg/dL (ref 8.9–10.3)
Chloride: 102 mmol/L (ref 98–111)
Creatinine, Ser: 1.15 mg/dL (ref 0.61–1.24)
GFR, Estimated: >60 mL/min (ref >60)
Glucose, Bld: 184 mg/dL — ABNORMAL HIGH (ref 70–99)
Potassium: 3.3 mmol/L — ABNORMAL LOW (ref 3.5–5.1)
Sodium: 136 mmol/L (ref 135–145)

## 2023-01-02 LAB — CBC
HCT: 44.1 % (ref 39.0–52.0)
Hemoglobin: 15.3 g/dL (ref 13.0–17.0)
MCH: 31.9 pg (ref 26.0–34.0)
MCHC: 34.7 g/dL (ref 30.0–36.0)
MCV: 91.9 fL (ref 80.0–100.0)
Platelets: 306 10*3/uL (ref 150–400)
RBC: 4.8 MIL/uL (ref 4.22–5.81)
RDW: 13.4 % (ref 11.5–15.5)
WBC: 8.3 10*3/uL (ref 4.0–10.5)
nRBC: 0 % (ref 0.0–0.2)

## 2023-01-02 LAB — CBG MONITORING, ED: Glucose-Capillary: 159 mg/dL — ABNORMAL HIGH (ref 70–99)

## 2023-01-02 MED ORDER — POTASSIUM CHLORIDE ER 10 MEQ PO TBCR: 10.0000 meq | EXTENDED_RELEASE_TABLET | Freq: Every day | ORAL | 0 refills | Status: DC

## 2023-01-02 MED ORDER — PROCHLORPERAZINE EDISYLATE 10 MG/2ML IJ SOLN
5.0000 mg | Freq: Once | INTRAMUSCULAR | Status: AC
  Administered 2023-01-02: 5 mg via INTRAVENOUS
  Filled 2023-01-02: qty 2

## 2023-01-02 MED ORDER — POTASSIUM CHLORIDE CRYS ER 20 MEQ PO TBCR
40.0000 meq | EXTENDED_RELEASE_TABLET | Freq: Once | ORAL | Status: AC
  Administered 2023-01-02: 40 meq via ORAL
  Filled 2023-01-02: qty 2

## 2023-01-02 MED ORDER — MECLIZINE HCL 12.5 MG PO TABS: 12.5000 mg | ORAL_TABLET | Freq: Three times a day (TID) | ORAL | 0 refills | Status: DC | PRN

## 2023-01-02 MED ORDER — INSULIN LISPRO (1 UNIT DIAL) 100 UNIT/ML (KWIKPEN): PEN_INJECTOR | SUBCUTANEOUS | 2 refills | Status: DC

## 2023-01-02 MED ORDER — TOUJEO SOLOSTAR 300 UNIT/ML ~~LOC~~ SOPN: PEN_INJECTOR | SUBCUTANEOUS | 2 refills | Status: DC

## 2023-01-02 NOTE — Discharge Instructions (Signed)
Thank you for allowing [place] to take care of you today.  We hope you begin feeling better soon.  To-Do: Please follow-up with your primary doctor. Please return to the Emergency Department or call 911 if you experience chest pain, shortness of breath, severe pain, severe fever, altered mental status, or have any reason to think that you need emergency medical care.  Thank you again.  Hope you feel better soon.  Department of Emergency Medicine [facility]

## 2023-01-02 NOTE — ED Triage Notes (Signed)
Pt is coming home, for dizziness, started after he ae pizza 2hrs ago. Pt has some nausea, shortness of breath. Shortness of breath improved with O2 and nausea has improved as well. Similar event happened 2 weeks ago and he was hyperglycemic. Pt ran out of his insulin today before this happened and son reports sometimes he can get anxious when he runs out of his insulin.   4mg  zofran  18g in left arm.  Medic vitals   200 sys  181bgl 70hr 22rr

## 2023-01-02 NOTE — ED Provider Notes (Signed)
[place] EMERGENCY DEPARTMENT AT [facility] Provider Note  CSN: [id number] Arrival date & time: 01/02/23 0018  Chief Complaint(s) Dizziness  HPI [name] is a 88 y.o. male     Dizziness Quality:  Lightheadedness Severity:  Moderate Onset quality:  Gradual Duration:  6 hours Timing:  Constant Progression:  Unchanged Chronicity:  Recurrent Relieved by:  Nothing Worsened by:  Nothing Associated symptoms: no blood in stool, no chest pain, no diarrhea, no headaches, no nausea, no palpitations, no shortness of breath, no syncope, no vision changes, no vomiting and no weakness    Triage note that states that patient complained of shortness of breath and nausea but he denied this to me.   Past Medical History Past Medical History:  Diagnosis Date   Anxiety    Borderline hyperlipidemia    CAD (coronary artery disease)    Colon polyps    Complication of anesthesia    trouble urinating after anesthesia   Diverticulosis of colon    DJD (degenerative joint disease)    DM (diabetes mellitus) (HCC)    Adult onset  type 2   History of gastritis    History of kidney stones    surgery to remove   History of pyelonephritis    History of syncope    HTN (hypertension)    Hypertrophy of prostate with urinary obstruction and other lower urinary tract symptoms (LUTS)    Melanoma (HCC)    back of neck   Melanoma (HCC) 2022   per dermatology   Myoclonus    Pancreatitis due to biliary obstruction several years ago   treated by Dr. Lina Sar   Patient Active Problem List   Diagnosis Date Noted   Memory change 07/31/2022   Drug-induced myopathy 07/28/2021   Tachycardia 01/09/2021   DNR (do not resuscitate) 04/13/2020   Hematoma 04/05/2020   Epigastric abdominal pain 04/01/2020   History of melanoma 2022   Medicare annual wellness visit, subsequent 01/03/2019   Recurrent Epistaxis 03/02/2017   Vitamin B 12 deficiency 08/17/2015   Elevated TSH 10/06/2014    Unsteady gait 09/18/2014   Midline low back pain without sciatica 09/18/2014   Chronic combined systolic and diastolic congestive heart failure (HCC) 01/27/2014   Ataxia 01/10/2014   History of coronary artery disease    Chest pain 12/15/2013   Healthcare maintenance 11/10/2013   Advance care planning 11/10/2013   ED (erectile dysfunction) 06/24/2013   HLD (hyperlipidemia) 03/22/2012   Type 2 diabetes mellitus with vascular disease (HCC) 03/22/2012   Thyroid cyst 03/19/2011   BPH with obstruction/lower urinary tract symptoms 03/15/2010   GASTRITIS 02/21/2007   Anxiety state 02/15/2007   CAD (coronary artery disease) 02/15/2007   DIVERTICULOSIS OF COLON 02/15/2007   COLONIC POLYPS 02/12/2007   Essential hypertension 02/12/2007   RENAL CALCULUS 02/12/2007   Osteoarthritis 02/12/2007   Home Medication(s) Prior to Admission medications   Medication Sig Start Date End Date Taking? Authorizing Provider  meclizine (ANTIVERT) 12.5 MG tablet Take 1 tablet (12.5 mg total) by mouth 3 (three) times daily as needed for dizziness. 01/02/23  Yes Clarence Cogswell, Amadeo Garnet, MD  potassium chloride (KLOR-CON) 10 MEQ tablet Take 1 tablet (10 mEq total) by mouth daily for 14 days. 01/02/23 01/16/23 Yes Tamlyn Sides, Amadeo Garnet, MD  Accu-Chek Softclix Lancets lancets TEST BLOOD SUGAR UP TO THREE TIMES DAILY 01/23/20   Joaquim Nam, MD  amLODipine (NORVASC) 10 MG tablet TAKE 1 TABLET BY MOUTH IN THE  MORNING 06/23/22  Joaquim Nam, MD  aspirin EC 81 MG tablet Take 81 mg by mouth every evening.    [provider]  Blood Glucose Calibration (ACCU-CHEK AVIVA) SOLN Use as directed monthly. E11.59  Insulin dependent 12/06/18   Joaquim Nam, MD  Blood Glucose Monitoring Suppl (ACCU-CHEK AVIVA PLUS) w/Device KIT Check sugar up to 3 times daily. E11.59  Insulin dependent 11/30/18   Joaquim Nam, MD  carvedilol (COREG) 12.5 MG tablet TAKE 1 TABLET BY MOUTH TWICE  DAILY WITH MEALS 06/23/22   Joaquim Nam, MD  COD LIVER OIL PO Take 1 capsule by mouth 2 (two) times daily.     [provider]  Continuous Glucose Receiver (FREESTYLE LIBRE 3 READER) DEVI 1 each by Does not apply route as directed. 05/28/22   Joaquim Nam, MD  Continuous Glucose Sensor (FREESTYLE LIBRE 3 SENSOR) MISC Place 1 sensor on the skin every 14 days. Use to check glucose continuously 05/28/22   Joaquim Nam, MD  cyanocobalamin (,VITAMIN B-12,) 1000 MCG/ML injection Inject 1 mL (1,000 mcg total) into the muscle every 30 (thirty) days. Dispense 3 of the 1 mL vials. 10/29/18   Joaquim Nam, MD  DROPLET PEN NEEDLES 30G X 8 MM MISC USE  WITH  INSULIN  PEN 02/03/19   Joaquim Nam, MD  glucose blood (ONETOUCH ULTRA) test strip USE TO CHECK UP TO 3 TIMES DAILY AS NEEDED 09/01/22   Joaquim Nam, MD  HUMALOG KWIKPEN 100 UNIT/ML KwikPen INJECT SUBCUTANEOUSLY 4-10 UNITS 3 TIMES DAILY BEFORE MEALS PER  SLIDING SCALE: 151-200 = 4 UN,  201-250 = 6 UN, 251-300 = 8 UN,  AND OVER 300 = 10 UN 10/21/22   Joaquim Nam, MD  hydrALAZINE (APRESOLINE) 50 MG tablet TAKE 1 TABLET BY MOUTH 3 TIMES  DAILY 06/23/22   Joaquim Nam, MD  insulin glargine, 1 Unit Dial, ([name] SOLOSTAR) 300 UNIT/ML Solostar Pen INJECT SUBCUTANEOUSLY 16  UNITS AT BEDTIME 12/31/22   Joaquim Nam, MD  lisinopril-hydrochlorothiazide (ZESTORETIC) 20-25 MG tablet TAKE 1 TABLET BY MOUTH IN THE  MORNING 06/23/22   Joaquim Nam, MD  potassium chloride SA (KLOR-CON M) 20 MEQ tablet TAKE 1 TABLET BY MOUTH DAILY 06/23/22   Joaquim Nam, MD  SYRINGE-NEEDLE, DISP, 3 ML (B-D 3CC LUER-LOK SYR 25GX1") 25G X 1" 3 ML MISC USE TO INJECT VITAMIN B12 MONTHLY. 08/22/19   Joaquim Nam, MD  tamsulosin (FLOMAX) 0.4 MG CAPS capsule TAKE 1 CAPSULE BY MOUTH DAILY  AFTER SUPPER 12/05/22   Joaquim Nam, MD  traMADol (ULTRAM) 50 MG tablet TAKE 1 TABLET BY MOUTH EVERY 12 HOURS AS NEEDED FOR KNEE PAIN. 11/19/22   Joaquim Nam, MD  Allergies Morphine, Atorvastatin, Codeine phosphate, and Metformin and related  Review of Systems Review of Systems  Respiratory:  Negative for shortness of breath.   Cardiovascular:  Negative for chest pain, palpitations and syncope.  Gastrointestinal:  Negative for blood in stool, diarrhea, nausea and vomiting.  Neurological:  Positive for dizziness. Negative for weakness and headaches.   As noted in HPI  Physical Exam Vital Signs  I have reviewed the triage vital signs BP (!) 136/91   Pulse 69   Temp 98.4 F (36.9 C)   Resp 17   SpO2 96%   Physical Exam Vitals reviewed.  Constitutional:      General: He is not in acute distress.    Appearance: He is well-developed. He is not diaphoretic.  HENT:     Head: Normocephalic and atraumatic.     Nose: Nose normal.  Eyes:     General: No scleral icterus.       Right eye: No discharge.        Left eye: No discharge.     Conjunctiva/sclera: Conjunctivae normal.     Pupils: Pupils are equal, round, and reactive to light.  Cardiovascular:     Rate and Rhythm: Normal rate and regular rhythm.     Heart sounds: No murmur heard.    No friction rub. No gallop.  Pulmonary:     Effort: Pulmonary effort is normal. No respiratory distress.     Breath sounds: Normal breath sounds. No stridor. No rales.  Abdominal:     General: There is no distension.     Palpations: Abdomen is soft.     Tenderness: There is no abdominal tenderness.  Musculoskeletal:        General: No tenderness.     Cervical back: Normal range of motion and neck supple.  Skin:    General: Skin is warm and dry.     Findings: No erythema or rash.  Neurological:     Comments: Mental Status:  Alert and oriented to person, place, and time.  Attention and concentration normal.  Speech clear.  Recent memory is intact  Cranial Nerves:  II Visual Fields: Intact to  confrontation. Visual fields intact. III, IV, VI: Pupils equal and reactive to light and near. Full eye movement without nystagmus  V Facial Sensation: Normal. No weakness of masticatory muscles  VII: No facial weakness or asymmetry  VIII Auditory Acuity: Grossly normal  IX/X: The uvula is midline; the palate elevates symmetrically  XI: Normal sternocleidomastoid and trapezius strength  XII: The tongue is midline. No atrophy or fasciculations.   Motor System: Muscle Strength: 5/5 and symmetric in the upper and lower extremities. Muscle Tone: Tone and muscle bulk are normal in the upper and lower extremities.  Coordination: Intact finger-to-nose. No tremor.  Sensation: Intact to light touch Gait: wide gait ataxia; stable with cane (baseline)      ED Results and Treatments Labs (all labs ordered are listed, but only abnormal results are displayed) Labs Reviewed  BASIC METABOLIC PANEL - Abnormal; Notable for the following components:      Result Value   Potassium 3.3 (*)    Glucose, Bld 184 (*)    All other components within normal limits  URINALYSIS, ROUTINE W REFLEX MICROSCOPIC - Abnormal; Notable for the following components:   Color, Urine STRAW (*)    Protein, ur 100 (*)    Leukocytes,Ua TRACE (*)    Bacteria, UA RARE (*)    All other components within normal limits  CBG MONITORING, ED - Abnormal; Notable for the following components:   Glucose-Capillary 159 (*)    All other components within normal limits  CBC                                                                                                                         EKG  EKG Interpretation Date/Time:  Friday January 02 2023 00:33:04 EST Ventricular Rate:  86 PR Interval:  196 QRS Duration:  106 QT Interval:  378 QTC Calculation: 452 R Axis:   6  Text Interpretation: Sinus rhythm with Premature atrial complexes Nonspecific ST and T wave abnormality Abnormal ECG When compared with ECG of 17-Nov-2022  18:07, PREVIOUS ECG IS PRESENT Confirmed by Drema Pry [phone]) on 01/02/2023 1:22:14 AM       Radiology No results found.  Medications Ordered in ED Medications  potassium chloride SA (KLOR-CON M) CR tablet 40 mEq (40 mEq Oral Given by Other 01/02/23 0340)  prochlorperazine (COMPAZINE) injection 5 mg (5 mg Intravenous Given by Other 01/02/23 9629)   Procedures Procedures  (including critical care time) Medical Decision Making / ED Course   Medical Decision Making Amount and/or Complexity of Data Reviewed Labs: ordered. Decision-making details documented in ED Course. ECG/medicine tests: ordered and independent interpretation performed. Decision-making details documented in ED Course.  Risk Prescription drug management.    Patient's exam is nonfocal and not concerning for CVA.  Patient was able to ambulate at baseline with a cane multiple times down the hall to the bathroom.  CBC without leukocytosis or anemia Metabolic panel with mild hypokalemia.  No other significant electrolyte derangements or renal sufficiency.  Hyperglycemia without DKA. UA similar to prior and not overtly concerning for UTI given patient's negative urinary symptoms.  Oral K+ repletion given.  Clinical Course as of 01/02/23 0818  Fri Jan 02, 2023  0510 Again noted patient ambulating numerous times to restroom w/o assistance. [PC]    Clinical Course User Index [PC] Channon Ambrosini, Amadeo Garnet, MD    Final Clinical Impression(s) / ED Diagnoses Final diagnoses:  Lightheadedness  Hypokalemia   The patient appears reasonably screened and/or stabilized for discharge and I doubt any other medical condition or other [facility] requiring further screening, evaluation, or treatment in the ED at this time. I have discussed the findings, Dx and Tx plan with the patient/family who expressed understanding and agree(s) with the plan. Discharge instructions discussed at length. The patient/family was given strict return  precautions who verbalized understanding of the instructions. No further questions at time of discharge.  Disposition: Discharge  Condition: Good  ED Discharge Orders          Ordered    potassium chloride (KLOR-CON) 10 MEQ tablet  Daily        01/02/23 0508    meclizine (ANTIVERT) 12.5 MG tablet  3 times daily PRN        01/02/23 5284  Follow Up: Joaquim Nam, MD [address] [place] [address] [phone]  Call  to schedule an appointment for close follow up to recheck potassium level     This chart was dictated using voice recognition software.  Despite best efforts to proofread,  errors can occur which can change the documentation meaning.    Nira Conn, MD 01/02/23 [phone]

## 2023-01-02 NOTE — ED Notes (Signed)
Pt provided with ice chips. Cleared with edp cardama

## 2023-01-02 NOTE — Telephone Encounter (Signed)
I sent both. I don't think the pharmacy will fill single pens but he can check with them.  Thanks.

## 2023-01-02 NOTE — Telephone Encounter (Signed)
Prescription Request  01/02/2023  LOV: 12/29/2022  What is the name of the medication or equipment? HUMALOG KWIKPEN 100 UNIT/ML KwikPen   Have you contacted your pharmacy to request a refill? No   Which pharmacy would you like this sent to?  [facility] - [place], [place] - [address] [place] [address] Phone: [phone] Fax: [phone]    Patient notified that their request is being sent to the clinical staff for review and that they should receive a response within 2 business days.   Please advise at [facility] [phone]  Patients son called and said that patient is out of insulin pens ,and he's not sure when he will get them,he would like to know if he could have atleast one sent In to local pharmacy?

## 2023-01-22 ENCOUNTER — Observation Stay (HOSPITAL_COMMUNITY)
Admission: EM | Admit: 2023-01-22 | Discharge: 2023-01-24 | Disposition: A | Discharge status: Home / Self Care | Payer: Medicare Other | Attending: Emergency Medicine | Admitting: Emergency Medicine

## 2023-01-22 ENCOUNTER — Encounter (HOSPITAL_COMMUNITY): Payer: Self-pay

## 2023-01-22 ENCOUNTER — Telehealth: Payer: Self-pay | Admitting: Family Medicine

## 2023-01-22 ENCOUNTER — Emergency Department (HOSPITAL_COMMUNITY): Payer: Medicare Other

## 2023-01-22 ENCOUNTER — Other Ambulatory Visit: Payer: Self-pay

## 2023-01-22 DIAGNOSIS — Z794 Long term (current) use of insulin: Secondary | ICD-10-CM | POA: Diagnosis not present

## 2023-01-22 DIAGNOSIS — I499 Cardiac arrhythmia, unspecified: Secondary | ICD-10-CM | POA: Diagnosis not present

## 2023-01-22 DIAGNOSIS — Z85828 Personal history of other malignant neoplasm of skin: Secondary | ICD-10-CM | POA: Insufficient documentation

## 2023-01-22 DIAGNOSIS — Z79899 Other long term (current) drug therapy: Secondary | ICD-10-CM | POA: Diagnosis not present

## 2023-01-22 DIAGNOSIS — R251 Tremor, unspecified: Principal | ICD-10-CM | POA: Diagnosis present

## 2023-01-22 DIAGNOSIS — R0689 Other abnormalities of breathing: Secondary | ICD-10-CM | POA: Diagnosis not present

## 2023-01-22 DIAGNOSIS — M1990 Unspecified osteoarthritis, unspecified site: Secondary | ICD-10-CM | POA: Diagnosis present

## 2023-01-22 DIAGNOSIS — N390 Urinary tract infection, site not specified: Secondary | ICD-10-CM | POA: Diagnosis not present

## 2023-01-22 DIAGNOSIS — E669 Obesity, unspecified: Secondary | ICD-10-CM

## 2023-01-22 DIAGNOSIS — I5040 Unspecified combined systolic (congestive) and diastolic (congestive) heart failure: Secondary | ICD-10-CM | POA: Insufficient documentation

## 2023-01-22 DIAGNOSIS — I152 Hypertension secondary to endocrine disorders: Secondary | ICD-10-CM | POA: Diagnosis present

## 2023-01-22 DIAGNOSIS — R55 Syncope and collapse: Principal | ICD-10-CM

## 2023-01-22 DIAGNOSIS — R001 Bradycardia, unspecified: Secondary | ICD-10-CM | POA: Diagnosis not present

## 2023-01-22 DIAGNOSIS — I2510 Atherosclerotic heart disease of native coronary artery without angina pectoris: Secondary | ICD-10-CM | POA: Diagnosis not present

## 2023-01-22 DIAGNOSIS — Z7982 Long term (current) use of aspirin: Secondary | ICD-10-CM | POA: Diagnosis not present

## 2023-01-22 DIAGNOSIS — I10 Essential (primary) hypertension: Secondary | ICD-10-CM | POA: Diagnosis present

## 2023-01-22 DIAGNOSIS — E119 Type 2 diabetes mellitus without complications: Secondary | ICD-10-CM | POA: Diagnosis not present

## 2023-01-22 DIAGNOSIS — E785 Hyperlipidemia, unspecified: Secondary | ICD-10-CM | POA: Diagnosis present

## 2023-01-22 DIAGNOSIS — R42 Dizziness and giddiness: Secondary | ICD-10-CM | POA: Diagnosis not present

## 2023-01-22 DIAGNOSIS — I110 Hypertensive heart disease with heart failure: Secondary | ICD-10-CM | POA: Insufficient documentation

## 2023-01-22 DIAGNOSIS — E1159 Type 2 diabetes mellitus with other circulatory complications: Secondary | ICD-10-CM | POA: Diagnosis present

## 2023-01-22 DIAGNOSIS — R569 Unspecified convulsions: Secondary | ICD-10-CM | POA: Diagnosis not present

## 2023-01-22 LAB — I-STAT CHEM 8, ED
BUN: 22 mg/dL (ref 8–23)
Calcium, Ion: 1.19 mmol/L (ref 1.15–1.40)
Chloride: 100 mmol/L (ref 98–111)
Creatinine, Ser: 1.2 mg/dL (ref 0.61–1.24)
Glucose, Bld: 186 mg/dL — ABNORMAL HIGH (ref 70–99)
HCT: 41 % (ref 39.0–52.0)
Hemoglobin: 13.9 g/dL (ref 13.0–17.0)
Potassium: 3.5 mmol/L (ref 3.5–5.1)
Sodium: 136 mmol/L (ref 135–145)
TCO2: 24 mmol/L (ref 22–32)

## 2023-01-22 LAB — COMPREHENSIVE METABOLIC PANEL
ALT: 17 U/L (ref 0–44)
AST: 18 U/L (ref 15–41)
Albumin: 3.5 g/dL (ref 3.5–5.0)
Alkaline Phosphatase: 54 U/L (ref 38–126)
Anion gap: 12 (ref 5–15)
BUN: 20 mg/dL (ref 8–23)
CO2: 24 mmol/L (ref 22–32)
Calcium: 9.9 mg/dL (ref 8.9–10.3)
Chloride: 101 mmol/L (ref 98–111)
Creatinine, Ser: 1.18 mg/dL (ref 0.61–1.24)
GFR, Estimated: >60 mL/min (ref >60)
Glucose, Bld: 175 mg/dL — ABNORMAL HIGH (ref 70–99)
Potassium: 3.5 mmol/L (ref 3.5–5.1)
Sodium: 137 mmol/L (ref 135–145)
Total Bilirubin: 0.6 mg/dL (ref <1.2)
Total Protein: 6.3 g/dL — ABNORMAL LOW (ref 6.5–8.1)

## 2023-01-22 LAB — URINALYSIS, ROUTINE W REFLEX MICROSCOPIC
Bilirubin Urine: NEGATIVE
Glucose, UA: NEGATIVE mg/dL
Ketones, ur: NEGATIVE mg/dL
Nitrite: NEGATIVE
Protein, ur: 100 mg/dL — AB
Specific Gravity, Urine: 1.01 (ref 1.005–1.030)
pH: 9 — ABNORMAL HIGH (ref 5.0–8.0)

## 2023-01-22 LAB — CBC WITH DIFFERENTIAL/PLATELET
Abs Immature Granulocytes: 0.04 10*3/uL (ref 0.00–0.07)
Basophils Absolute: 0.1 10*3/uL (ref 0.0–0.1)
Basophils Relative: 1 %
Eosinophils Absolute: 0.3 10*3/uL (ref 0.0–0.5)
Eosinophils Relative: 3 %
HCT: 39.8 % (ref 39.0–52.0)
Hemoglobin: 13.7 g/dL (ref 13.0–17.0)
Immature Granulocytes: 1 %
Lymphocytes Relative: 25 %
Lymphs Abs: 2.1 10*3/uL (ref 0.7–4.0)
MCH: 31.6 pg (ref 26.0–34.0)
MCHC: 34.4 g/dL (ref 30.0–36.0)
MCV: 91.9 fL (ref 80.0–100.0)
Monocytes Absolute: 0.8 10*3/uL (ref 0.1–1.0)
Monocytes Relative: 9 %
Neutro Abs: 5.3 10*3/uL (ref 1.7–7.7)
Neutrophils Relative %: 61 %
Platelets: 318 10*3/uL (ref 150–400)
RBC: 4.33 MIL/uL (ref 4.22–5.81)
RDW: 13.3 % (ref 11.5–15.5)
WBC: 8.5 10*3/uL (ref 4.0–10.5)
nRBC: 0 % (ref 0.0–0.2)

## 2023-01-22 LAB — TROPONIN I (HIGH SENSITIVITY)
Troponin I (High Sensitivity): 26 ng/L — ABNORMAL HIGH (ref <18)
Troponin I (High Sensitivity): 19 ng/L — ABNORMAL HIGH (ref <18)

## 2023-01-22 LAB — CBG MONITORING, ED
Glucose-Capillary: 186 mg/dL — ABNORMAL HIGH (ref 70–99)
Glucose-Capillary: 235 mg/dL — ABNORMAL HIGH (ref 70–99)

## 2023-01-22 LAB — BRAIN NATRIURETIC PEPTIDE: B Natriuretic Peptide: 247.4 pg/mL — ABNORMAL HIGH (ref 0.0–100.0)

## 2023-01-22 LAB — D-DIMER, QUANTITATIVE: D-Dimer, Quant: 0.49 ug{FEU}/mL (ref 0.00–0.50)

## 2023-01-22 LAB — MAGNESIUM: Magnesium: 1.8 mg/dL (ref 1.7–2.4)

## 2023-01-22 MED ORDER — SODIUM CHLORIDE 0.9 % IV SOLN
1.0000 g | Freq: Once | INTRAVENOUS | Status: AC
  Administered 2023-01-22: 1 g via INTRAVENOUS
  Filled 2023-01-22: qty 10

## 2023-01-22 NOTE — Telephone Encounter (Signed)
Duly noted.  I see that the patient is at ER now.  I will await ER notes.

## 2023-01-22 NOTE — Consult Note (Addendum)
NEUROLOGY CONSULT NOTE   Date of service: January 22, 2023 Patient Name: [name] MRN:  [id number] DOB:  February 25, 1934 Chief Complaint: Limb jerking Requesting Provider: Gerhard Munch, MD  History of Present Illness  [name] is a 88 y.o. male who  has a past medical history of Anxiety, Borderline hyperlipidemia, CAD (coronary artery disease), Colon polyps, Complication of anesthesia, Diverticulosis of colon, DJD (degenerative joint disease), DM (diabetes mellitus) (HCC), History of gastritis, History of kidney stones, History of pyelonephritis, History of syncope, HTN (hypertension), Hypertrophy of prostate with urinary obstruction and other lower urinary tract symptoms (LUTS), Melanoma (HCC), Melanoma (HCC) (2022), Myoclonus, and Pancreatitis due to biliary obstruction (several years ago).,who presented to the ED on Thursday afternoon with HTN, dizziness and tremors that started on the same day. The patient told EMS that he has a prior history of tremors. Per EMS, the patietn had a possible seizure, during which he lost all functions of body, slid down in chair, but stayed alert.   The patient later stated to Hospitalist that he had been feeling dizzy since he woke up in the morning.  When he sat down in the kitchen table he had generalized tremors, but never lost consciousness and did not have any bowel or bladder incontinence.  He had been having symptoms of dysuria and also minimal hematuria Thursday morning, but no abdominal pain. He had been feeling nauseous but with no vomiting, no diarrhea, no fevers or chills.   CT head was negative.  UA with small leukocyte, negative nitrate and few bacteria. He was given IV rocephin in the ED. EDP also consulted Neurology to evaluate for possible seizures.     ROS  Comprehensive ROS performed and pertinent positives documented in HPI    Past History   Past Medical History:  Diagnosis Date   Anxiety    Borderline hyperlipidemia     CAD (coronary artery disease)    Colon polyps    Complication of anesthesia    trouble urinating after anesthesia   Diverticulosis of colon    DJD (degenerative joint disease)    DM (diabetes mellitus) (HCC)    Adult onset  type 2   History of gastritis    History of kidney stones    surgery to remove   History of pyelonephritis    History of syncope    HTN (hypertension)    Hypertrophy of prostate with urinary obstruction and other lower urinary tract symptoms (LUTS)    Melanoma (HCC)    back of neck   Melanoma (HCC) 2022   per dermatology   Myoclonus    Pancreatitis due to biliary obstruction several years ago   treated by Dr. Lina Sar    Past Surgical History:  Procedure Laterality Date   APPENDECTOMY     cartarized  nose blood vessel     left side   CATARACT EXTRACTION     RIGHT EYE   CHOLECYSTECTOMY     cystoscopy  04/17/11   stent placed   CYSTOSCOPY W/ URETERAL STENT PLACEMENT  04/17/2011   Procedure: CYSTOSCOPY WITH RETROGRADE PYELOGRAM/URETERAL STENT PLACEMENT;  Surgeon: Lindaann Slough, MD;  Location: [facility];  Service: Urology;  Laterality: Left;   CYSTOSCOPY WITH RETROGRADE PYELOGRAM, URETEROSCOPY AND STENT PLACEMENT Left 04/18/2013   Procedure: CYSTOSCOPY WITH LEFT RETROGRADE PYELOGRAM, LEFT URETEROSCOPY AND LASER LITHOTRIPSY LEFT STENT PLACEMENT;  Surgeon: Crecencio Mc, MD;  Location: [facility];  Service: Urology;  Laterality: Left;   HOLMIUM LASER APPLICATION Left 04/18/2013  Procedure: HOLMIUM LASER APPLICATION;  Surgeon: Crecencio Mc, MD;  Location: [facility];  Service: Urology;  Laterality: Left;   INCISION AND DRAINAGE ABSCESS Left 04/04/2020   Procedure: Angelina Pih OF LEFT ARM HEMATOMA;  Surgeon: Almond Lint, MD;  Location: [facility];  Service: General;  Laterality: Left;  left   IRRIGATION AND DEBRIDEMENT ABSCESS Left 10/05/2014   Procedure: Leighton Ruff D LEFT INDEX FINGER;  Surgeon: Knute Neu, MD;  Location: [facility];  Service: Plastics;  Laterality: Left;   KNEE SURGERY      bilateral ARTHROSCOPY X2 TO EACH KNEE   MELANOMA EXCISION  2018   MELANOMA EXCISION WITH SENTINEL LYMPH NODE BIOPSY Left 03/29/2020   Procedure: WIDE LOCAL EXCISION, ADVANCED FLAP CLOSURE LEFT ARM MELANOMA DEFECT WITH SENTINEL LYMPH NODE MAPPING AND BIOPSY;  Surgeon: Almond Lint, MD;  Location: [facility];  Service: General;  Laterality: Left;   S/P cysto & stents for kidnedy stone 11/08 by Dr. Wanda Plump     S/P ELap 1986 w/excision of leiomyoma @ GE junction, Meckle's divertic resected, & cholecystectomy     SHOULDER SURGERY  04/2005   left by Dr. Luiz Blare    Family History: Family History  Problem Relation Age of Onset   Heart attack Mother    Dementia Brother    Colon cancer Neg Hx    Prostate cancer Neg Hx     Social History  reports that he has never smoked. He has never used smokeless tobacco. He reports that he does not drink alcohol and does not use drugs.  Allergies  Allergen Reactions   Morphine Nausea Only, Other (See Comments) and Nausea And Vomiting    Can take with food and usually doesn't cause nausea   Atorvastatin     Intolerant - nausea/myalgias   Codeine Phosphate Nausea And Vomiting   Metformin And Related Diarrhea    Medications  No current facility-administered medications for this encounter.  Current Outpatient Medications:    amLODipine (NORVASC) 10 MG tablet, TAKE 1 TABLET BY MOUTH IN THE  MORNING, Disp: 100 tablet, Rfl: 2   aspirin EC 81 MG tablet, Take 81 mg by mouth every evening., Disp: , Rfl:    carvedilol (COREG) 12.5 MG tablet, TAKE 1 TABLET BY MOUTH TWICE  DAILY WITH MEALS, Disp: 200 tablet, Rfl: 2   COD LIVER OIL PO, Take 1 capsule by mouth 2 (two) times daily. , Disp: , Rfl:    cyanocobalamin (,VITAMIN B-12,) 1000 MCG/ML injection, Inject 1 mL (1,000 mcg total) into the muscle every 30 (thirty) days. Dispense 3 of the 1 mL vials., Disp: 3 mL, Rfl: 3   diphenhydrAMINE HCl (BENADRYL ALLERGY PO), Take 1 each by mouth at bedtime. Liquid Gels, Disp: ,  Rfl:    glimepiride (AMARYL) 4 MG tablet, Take 0.5 tablets by mouth daily with breakfast., Disp: , Rfl:    hydrALAZINE (APRESOLINE) 50 MG tablet, TAKE 1 TABLET BY MOUTH 3 TIMES  DAILY, Disp: 300 tablet, Rfl: 2   insulin glargine, 1 Unit Dial, ([name] SOLOSTAR) 300 UNIT/ML Solostar Pen, INJECT SUBCUTANEOUSLY 16  UNITS AT BEDTIME (Patient taking differently: Inject 16 Units into the skin at bedtime.), Disp: 4.5 mL, Rfl: 2   insulin lispro (HUMALOG KWIKPEN) 100 UNIT/ML KwikPen, INJECT SUBCUTANEOUSLY 4-10 UNITS 3 TIMES DAILY BEFORE MEALS PER  SLIDING SCALE: 151-200 = 4 UN,  201-250 = 6 UN, 251-300 = 8 UN,  AND OVER 300 = 10 UN, Disp: 30 mL, Rfl: 2   lisinopril-hydrochlorothiazide (ZESTORETIC) 20-25 MG tablet, TAKE 1  TABLET BY MOUTH IN THE  MORNING, Disp: 100 tablet, Rfl: 2   meclizine (ANTIVERT) 12.5 MG tablet, Take 1 tablet (12.5 mg total) by mouth 3 (three) times daily as needed for dizziness., Disp: 30 tablet, Rfl: 0   naproxen sodium (ALEVE) 220 MG tablet, Take 220 mg by mouth at bedtime., Disp: , Rfl:    potassium chloride SA (KLOR-CON M) 20 MEQ tablet, TAKE 1 TABLET BY MOUTH DAILY, Disp: 100 tablet, Rfl: 2   tamsulosin (FLOMAX) 0.4 MG CAPS capsule, TAKE 1 CAPSULE BY MOUTH DAILY  AFTER SUPPER (Patient taking differently: Take 0.4 mg by mouth daily after supper.), Disp: 100 capsule, Rfl: 2   traMADol (ULTRAM) 50 MG tablet, TAKE 1 TABLET BY MOUTH EVERY 12 HOURS AS NEEDED FOR KNEE PAIN. (Patient taking differently: Take 50 mg by mouth every 12 (twelve) hours as needed for moderate pain (pain score 4-6) or severe pain (pain score 7-10) (Knee Pain).), Disp: 60 tablet, Rfl: 2   Accu-Chek Softclix Lancets lancets, TEST BLOOD SUGAR UP TO THREE TIMES DAILY, Disp: 300 each, Rfl: 3   Blood Glucose Calibration (ACCU-CHEK AVIVA) SOLN, Use as directed monthly. 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 E11.59   Insulin dependent, Disp: 1 kit, Rfl: 0   Continuous Glucose Receiver (FREESTYLE LIBRE 3 READER) DEVI, 1 each by Does not apply route as directed., Disp: 1 each, Rfl: 0   Continuous Glucose Sensor (FREESTYLE LIBRE 3 SENSOR) MISC, Place 1 sensor on the skin every 14 days. Use to check glucose continuously, Disp: 2 each, Rfl: 3   DROPLET PEN NEEDLES 30G X 8 MM MISC, USE  WITH  INSULIN  PEN, Disp: 400 each, Rfl: 5   glucose blood (ONETOUCH ULTRA) test strip, USE TO CHECK UP TO 3 TIMES DAILY AS NEEDED, Disp: 300 strip, Rfl: 2   SYRINGE-NEEDLE, DISP, 3 ML (B-D 3CC LUER-LOK SYR 25GX1") 25G X 1" 3 ML MISC, USE TO INJECT VITAMIN B12 MONTHLY., Disp: 3 each, Rfl: 1  Vitals   Vitals:   01/22/23 1853 01/22/23 1900 01/22/23 1930 01/22/23 2000  BP:  (!) 152/90 (!) 157/78 (!) 158/78  Pulse:  78 63 76  Resp:  19 (!) 21 13  Temp: 99.8 F (37.7 C)     TempSrc: Axillary     SpO2:  95% 95% 94%  Weight:      Height:        Body mass index is 30.83 kg/m.  Physical Exam   Physical Exam  HEENT-  [place]/AT    Lungs- Respirations unlabored Extremities- No edema  Neurological Examination Mental Status: Alert, oriented x 5, thought content appropriate.  Speech fluent without evidence of aphasia.  Able to follow all commands without difficulty. Cranial Nerves: II: Temporal visual fields intact with no extinction to DSS. PERRL  III,IV, VI: No ptosis. EOMI.  V: Temp sensation equal bilaterally  VII: Smile symmetric VIII: Hearing intact to voice IX,X: No hoarseness XI: Symmetric shoulder shrug XII: Midline tongue extension Motor: BUE 5/5 proximally and distally BLE 5/5 proximally and distally  No pronator drift.  No limb jerking, tremoring, twitching or asterixis seen No cogwheel rigidity  Sensory: Temp and light touch intact throughout, bilaterally. No extinction to DSS.  Deep Tendon Reflexes: 1+ bilateral brachioradialis and biceps. Unable to elicit patellar reflexes.  Cerebellar: No ataxia with FNF  bilaterally. No resting tremor, intention tremor or action  tremor.  Gait: Deferred   Labs/Imaging/Neurodiagnostic studies   CBC:  Recent Labs  Lab 02/04/23 1349 02/04/2023 1401  WBC 8.5  --   NEUTROABS 5.3  --   HGB 13.7 13.9  HCT 39.8 41.0  MCV 91.9  --   PLT 318  --    Basic Metabolic Panel:  Lab Results  Component Value Date   NA 136 2023-02-04   K 3.5 04-Feb-2023   CO2 24 2023-02-04   GLUCOSE 186 (H) 02/04/2023   BUN 22 02/04/23   CREATININE 1.20 Feb 04, 2023   CALCIUM 9.9 02-04-23   GFRNONAA >60 2023-02-04   GFRAA >60 03/16/2018   Lipid Panel:  Lab Results  Component Value Date   LDLCALC 88 01/27/2022   HgbA1c:  Lab Results  Component Value Date   HGBA1C 8.3 (A) 12/29/2022   Urine Drug Screen: No results found for: "LABOPIA", "COCAINSCRNUR", "LABBENZ", "AMPHETMU", "THCU", "LABBARB"  Alcohol Level No results found for: "ETH" INR  Lab Results  Component Value Date   INR 0.98 03/16/2018   APTT  Lab Results  Component Value Date   APTT 29 03/16/2018   AED levels: No results found for: "PHENYTOIN", "ZONISAMIDE", "LAMOTRIGINE", "LEVETIRACETA"  CT Head without contrast (Personally reviewed): 1. No evidence of an acute intracranial abnormality. 2. Generalized cerebral atrophy.    ASSESSMENT  88 y.o. male who  has a past medical history of Anxiety, Borderline hyperlipidemia, CAD (coronary artery disease), Colon polyps, Complication of anesthesia, Diverticulosis of colon, DJD (degenerative joint disease), DM (diabetes mellitus) (HCC), History of gastritis, History of kidney stones, History of pyelonephritis, History of syncope, HTN (hypertension), Hypertrophy of prostate with urinary obstruction and other lower urinary tract symptoms (LUTS), Melanoma (HCC), Melanoma (HCC) (2022), Myoclonus, and Pancreatitis due to biliary obstruction (several years ago).,who presented to the ED on Thursday afternoon with HTN, dizziness and tremors that started on the same day.  The patient told EMS that he has a prior history of tremors. Per EMS, the patietn had a possible seizure, during which he lost all functions of body, slid down in chair, but stayed alert. The patient later stated to Hospitalist that he had been feeling dizzy since he woke up in the morning.  When he sat down in the kitchen table he had generalized tremors, but never lost consciousness and did not have any bowel or bladder incontinence.  He had been having symptoms of dysuria and also minimal hematuria Thursday morning, but no abdominal pain. He had been feeling nauseous but with no vomiting, no diarrhea, no fevers or chills. CT head was negative.  UA with small leukocyte, negative nitrate and few bacteria. He was given IV rocephin in the ED. EDP also consulted Neurology to evaluate for possible seizures.  - Neurological exam is essentially unremarkable.  - Semiology described by patient and son for both the current episode of shaking and one prior episode several years ago does not militate in favor of epileptic seizure as the etiology. However, will obtain EEG to further assess.  - Overall impression: Acute onset of severe tremors, now resolved. Has a history of anxiety, which may be related.   RECOMMENDATIONS  - EEG (ordered) - Medical management per Hospitalist team ______________________________________________________________________    Dessa Phi, Gino Garrabrant, MD [facility]

## 2023-01-22 NOTE — ED Triage Notes (Signed)
Pt arrived via [facility] from home for c/o HTN, dizziness and tremors started today. Pt told EMS he has a hx of tremors. Per EMS, pt had a possible seizure, pt lost all functions of body, slid down in chair, but stayed alert.

## 2023-01-22 NOTE — ED Notes (Signed)
Verified with central telemetry pt is on cardiac monitor surveillance.

## 2023-01-22 NOTE — Telephone Encounter (Signed)
Patient son Casimiro Needle called in and wanted to make Dr. Para March aware that he had to call the EMS for patient. He stated that he was having seizures and they are there at the house now. Just an FYI.

## 2023-01-22 NOTE — ED Provider Notes (Signed)
[facility] Provider Note   CSN: [id number] Arrival date & time: 01/22/23  1322     History  Chief Complaint  Patient presents with   Near Syncope   Tremors    [name] is a 88 y.o. male.  [name] is a 88 y.o. male with a history of hypertension, hyperlipidemia, BPH, myoclonus, CAD, diabetes, who presents to the emergency department via EMS for evaluation of lightheadedness and shaking.  Patient reports that he woke up this morning and around 7 to 8 AM started feeling very lightheaded and dizzy.  He describes this as feeling as though he is going to pass out.  He reports some nausea associated with the episode.  Reports symptoms are worse with position changes.  Patient has underlying history of myoclonic tremors, but these have been much worse today.  Patient's son is at bedside he came over to the house when son called him and told him about the symptoms this morning.  He started having worsening shaking and son called EMS.  When EMS arrived patient had an episode where he slid down into the chair and was incontinent of bowel and bladder and had shaking but stayed alert reportedly.  No postictal period noted.  No prior seizure history.  No associated weakness, numbness or visual changes.  Patient denies chest pain but does report some shortness of breath this morning.  No abdominal pain.  The history is provided by the patient, a relative, medical records and the EMS personnel.       Home Medications Prior to Admission medications   Medication Sig Start Date End Date Taking? Authorizing Provider  Accu-Chek Softclix Lancets lancets TEST BLOOD SUGAR UP TO THREE TIMES DAILY 01/23/20   Joaquim Nam, MD  amLODipine (NORVASC) 10 MG tablet TAKE 1 TABLET BY MOUTH IN THE  MORNING 06/23/22   Joaquim Nam, MD  aspirin EC 81 MG tablet Take 81 mg by mouth every evening.    [provider]  Blood Glucose Calibration  (ACCU-CHEK AVIVA) SOLN Use as directed monthly. E11.59  Insulin dependent 12/06/18   Joaquim Nam, MD  Blood Glucose Monitoring Suppl (ACCU-CHEK AVIVA PLUS) w/Device KIT Check sugar up to 3 times daily. E11.59  Insulin dependent 11/30/18   Joaquim Nam, MD  carvedilol (COREG) 12.5 MG tablet TAKE 1 TABLET BY MOUTH TWICE  DAILY WITH MEALS 06/23/22   Joaquim Nam, MD  COD LIVER OIL PO Take 1 capsule by mouth 2 (two) times daily.     [provider]  Continuous Glucose Receiver (FREESTYLE LIBRE 3 READER) DEVI 1 each by Does not apply route as directed. 05/28/22   Joaquim Nam, MD  Continuous Glucose Sensor (FREESTYLE LIBRE 3 SENSOR) MISC Place 1 sensor on the skin every 14 days. Use to check glucose continuously 05/28/22   Joaquim Nam, MD  cyanocobalamin (,VITAMIN B-12,) 1000 MCG/ML injection Inject 1 mL (1,000 mcg total) into the muscle every 30 (thirty) days. Dispense 3 of the 1 mL vials. 10/29/18   Joaquim Nam, MD  DROPLET PEN NEEDLES 30G X 8 MM MISC USE  WITH  INSULIN  PEN 02/03/19   Joaquim Nam, MD  glucose blood (ONETOUCH ULTRA) test strip USE TO CHECK UP TO 3 TIMES DAILY AS NEEDED 09/01/22   Joaquim Nam, MD  hydrALAZINE (APRESOLINE) 50 MG tablet TAKE 1 TABLET BY MOUTH 3 TIMES  DAILY 06/23/22   Para March,  Dwana Curd, MD  insulin glargine, 1 Unit Dial, ([name] SOLOSTAR) 300 UNIT/ML Solostar Pen INJECT SUBCUTANEOUSLY 16  UNITS AT BEDTIME 01/02/23   Joaquim Nam, MD  insulin lispro (HUMALOG KWIKPEN) 100 UNIT/ML KwikPen INJECT SUBCUTANEOUSLY 4-10 UNITS 3 TIMES DAILY BEFORE MEALS PER  SLIDING SCALE: 151-200 = 4 UN,  201-250 = 6 UN, 251-300 = 8 UN,  AND OVER 300 = 10 UN 01/02/23   Joaquim Nam, MD  lisinopril-hydrochlorothiazide (ZESTORETIC) 20-25 MG tablet TAKE 1 TABLET BY MOUTH IN THE  MORNING 06/23/22   Joaquim Nam, MD  meclizine (ANTIVERT) 12.5 MG tablet Take 1 tablet (12.5 mg total) by mouth 3 (three) times daily as needed for dizziness. 01/02/23   Cardama,  Amadeo Garnet, MD  potassium chloride (KLOR-CON) 10 MEQ tablet Take 1 tablet (10 mEq total) by mouth daily for 14 days. 01/02/23 01/16/23  Nira Conn, MD  potassium chloride SA (KLOR-CON M) 20 MEQ tablet TAKE 1 TABLET BY MOUTH DAILY 06/23/22   Joaquim Nam, MD  SYRINGE-NEEDLE, DISP, 3 ML (B-D 3CC LUER-LOK SYR 25GX1") 25G X 1" 3 ML MISC USE TO INJECT VITAMIN B12 MONTHLY. 08/22/19   Joaquim Nam, MD  tamsulosin (FLOMAX) 0.4 MG CAPS capsule TAKE 1 CAPSULE BY MOUTH DAILY  AFTER SUPPER 12/05/22   Joaquim Nam, MD  traMADol (ULTRAM) 50 MG tablet TAKE 1 TABLET BY MOUTH EVERY 12 HOURS AS NEEDED FOR KNEE PAIN. 11/19/22   Joaquim Nam, MD      Allergies    Morphine, Atorvastatin, Codeine phosphate, and Metformin and related    Review of Systems   Review of Systems  Constitutional:  Negative for chills and fever.  HENT: Negative.    Respiratory:  Positive for shortness of breath. Negative for cough.   Cardiovascular:  Negative for chest pain, palpitations and leg swelling.  Gastrointestinal:  Positive for nausea. Negative for abdominal pain, diarrhea and vomiting.  Genitourinary:  Negative for dysuria and hematuria.  Neurological:  Positive for dizziness, tremors and light-headedness.  All other systems reviewed and are negative.   Physical Exam Updated Vital Signs There were no vitals taken for this visit. Physical Exam Vitals and nursing note reviewed.  Constitutional:      General: He is not in acute distress.    Appearance: Normal appearance. He is well-developed. He is not diaphoretic.     Comments: Alert, mildly anxious, no acute distress  HENT:     Head: Normocephalic and atraumatic.     Mouth/Throat:     Mouth: Mucous membranes are moist.     Pharynx: Oropharynx is clear.  Eyes:     General:        Right eye: No discharge.        Left eye: No discharge.     Extraocular Movements: Extraocular movements intact.     Pupils: Pupils are equal, round, and  reactive to light.     Comments: No nystagmus  Cardiovascular:     Rate and Rhythm: Normal rate and regular rhythm.     Pulses: Normal pulses.     Heart sounds: Normal heart sounds.  Pulmonary:     Effort: No respiratory distress.     Breath sounds: Normal breath sounds. No wheezing or rales.     Comments: Patient is mildly tachypneic but without increased respiratory effort, satting well on room air, patient able to speak in full sentences, lungs clear to auscultation bilaterally  Abdominal:     General: Bowel  sounds are normal. There is no distension.     Palpations: Abdomen is soft. There is no mass.     Tenderness: There is no abdominal tenderness. There is no guarding.     Comments: Abdomen soft, nondistended, nontender to palpation in all quadrants without guarding or peritoneal signs  Musculoskeletal:        General: No deformity.     Cervical back: Neck supple.     Right lower leg: No edema.     Left lower leg: No edema.  Skin:    General: Skin is warm and dry.     Capillary Refill: Capillary refill takes less than 2 seconds.  Neurological:     Mental Status: He is alert and oriented to person, place, and time.     Coordination: Coordination normal.     Comments: Speech is clear, able to follow commands CN III-XII intact Normal strength in upper and lower extremities bilaterally including dorsiflexion and plantar flexion, strong and equal grip strength Sensation normal to light and sharp touch Moves extremities without ataxia, coordination intact. Tremor of extremities worse with movement.  Psychiatric:        Mood and Affect: Mood normal.        Behavior: Behavior normal.     ED Results / Procedures / Treatments   Labs (all labs ordered are listed, but only abnormal results are displayed) Labs Reviewed  URINALYSIS, ROUTINE W REFLEX MICROSCOPIC - Abnormal; Notable for the following components:      Result Value   pH 9.0 (*)    Hgb urine dipstick SMALL (*)     Protein, ur 100 (*)    Leukocytes,Ua SMALL (*)    Bacteria, UA FEW (*)    All other components within normal limits  I-STAT CHEM 8, ED - Abnormal; Notable for the following components:   Glucose, Bld 186 (*)    All other components within normal limits  CBG MONITORING, ED - Abnormal; Notable for the following components:   Glucose-Capillary 186 (*)    All other components within normal limits  COMPREHENSIVE METABOLIC PANEL  CBC WITH DIFFERENTIAL/PLATELET  MAGNESIUM  BRAIN NATRIURETIC PEPTIDE  D-DIMER, QUANTITATIVE  TROPONIN I (HIGH SENSITIVITY)    EKG None  Radiology No results found.  Procedures Procedures    Medications Ordered in ED Medications - No data to display  ED Course/ Medical Decision Making/ A&P                                 Medical Decision Making  88 y.o. male presents to the ED with complaints of lightheadedness and tremors, this involves an extensive number of treatment options, and is a complaint that carries with it a high risk of complications and morbidity.  The differential diagnosis includes syncope/near syncope, cardiac arrhythmia, dehydration, electrolyte derangement, seizure, intracranial mass, intracranial bleed, peripheral vertigo, posterior circulation stroke.  On arrival pt is nontoxic, vitals WNL.  No focal neurologic deficits noted on exam.  Additional history obtained from son at bedside. Previous records obtained and reviewed, patient had recent ED visit for lightheadedness, symptoms today are more severe than this prior episode.  Lab Tests:  I Ordered, reviewed, and interpreted labs, which included: No leukocytosis and normal hemoglobin, i-STAT Chem-8 with glucose of 186 and no other electrolyte derangements.  Troponin, D-dimer, BNP, magnesium, CMP pending.  UA with some RBCs and WBCs present and a few bacteria.  Imaging Studies  ordered:  I ordered imaging studies which included chest x-ray and head CT, imaging pending at shift  change.  ED Course:   Patient has previously been seen in the ED for lightheadedness, had worsening symptoms today with possible syncope, near syncopal episode versus seizure.  Feel patient would likely benefit from admission for further evaluation pending additional ED labs and imaging.  At shift change care signed out to Dr. Jeraldine Loots, who will follow up on pending workup and disposition appropriately.   Portions of this note were generated with Scientist, clinical (histocompatibility and immunogenetics). Dictation errors may occur despite best attempts at proofreading.        Final Clinical Impression(s) / ED Diagnoses Final diagnoses:  Near syncope  Tremor    Rx / DC Orders ED Discharge Orders     None         Velda Shell 01/22/23 1512    Gerhard Munch, MD 01/22/23 2151

## 2023-01-23 ENCOUNTER — Observation Stay (HOSPITAL_COMMUNITY): Payer: Medicare Other

## 2023-01-23 DIAGNOSIS — E785 Hyperlipidemia, unspecified: Secondary | ICD-10-CM | POA: Diagnosis not present

## 2023-01-23 DIAGNOSIS — M159 Polyosteoarthritis, unspecified: Secondary | ICD-10-CM | POA: Diagnosis not present

## 2023-01-23 DIAGNOSIS — E669 Obesity, unspecified: Secondary | ICD-10-CM

## 2023-01-23 DIAGNOSIS — E1159 Type 2 diabetes mellitus with other circulatory complications: Secondary | ICD-10-CM | POA: Diagnosis not present

## 2023-01-23 DIAGNOSIS — R251 Tremor, unspecified: Secondary | ICD-10-CM

## 2023-01-23 DIAGNOSIS — R569 Unspecified convulsions: Secondary | ICD-10-CM

## 2023-01-23 DIAGNOSIS — I10 Essential (primary) hypertension: Secondary | ICD-10-CM | POA: Diagnosis not present

## 2023-01-23 DIAGNOSIS — R319 Hematuria, unspecified: Secondary | ICD-10-CM

## 2023-01-23 DIAGNOSIS — N390 Urinary tract infection, site not specified: Secondary | ICD-10-CM

## 2023-01-23 LAB — GLUCOSE, CAPILLARY
Glucose-Capillary: 317 mg/dL — ABNORMAL HIGH (ref 70–99)
Glucose-Capillary: 226 mg/dL — ABNORMAL HIGH (ref 70–99)
Glucose-Capillary: 273 mg/dL — ABNORMAL HIGH (ref 70–99)
Glucose-Capillary: 267 mg/dL — ABNORMAL HIGH (ref 70–99)

## 2023-01-23 LAB — CBC
HCT: 39.2 % (ref 39.0–52.0)
Hemoglobin: 13.3 g/dL (ref 13.0–17.0)
MCH: 31.4 pg (ref 26.0–34.0)
MCHC: 33.9 g/dL (ref 30.0–36.0)
MCV: 92.7 fL (ref 80.0–100.0)
Platelets: 322 10*3/uL (ref 150–400)
RBC: 4.23 MIL/uL (ref 4.22–5.81)
RDW: 13.4 % (ref 11.5–15.5)
WBC: 7.2 10*3/uL (ref 4.0–10.5)
nRBC: 0 % (ref 0.0–0.2)

## 2023-01-23 LAB — LACTIC ACID, PLASMA: Lactic Acid, Venous: 1.5 mmol/L (ref 0.5–1.9)

## 2023-01-23 LAB — CK: Total CK: 453 U/L — ABNORMAL HIGH (ref 49–397)

## 2023-01-23 MED ORDER — LORAZEPAM 0.5 MG PO TABS
0.5000 mg | ORAL_TABLET | Freq: Once | ORAL | Status: AC
Start: 2023-01-23 — End: 2023-01-23
  Administered 2023-01-23: 0.5 mg via ORAL
  Filled 2023-01-23: qty 1

## 2023-01-23 MED ORDER — NAPROXEN 250 MG PO TABS
250.0000 mg | ORAL_TABLET | Freq: Every day | ORAL | Status: DC
Start: 2023-01-23 — End: 2023-01-24
  Administered 2023-01-23: 250 mg via ORAL
  Filled 2023-01-23 (×2): qty 1

## 2023-01-23 MED ORDER — TRAMADOL HCL 50 MG PO TABS: 50.0000 mg | ORAL_TABLET | Freq: Two times a day (BID) | ORAL | Status: DC | PRN

## 2023-01-23 MED ORDER — POTASSIUM CHLORIDE CRYS ER 20 MEQ PO TBCR
20.0000 meq | EXTENDED_RELEASE_TABLET | Freq: Every day | ORAL | Status: DC
Start: 2023-01-23 — End: 2023-01-24
  Administered 2023-01-23 – 2023-01-24 (×2): 20 meq via ORAL
  Filled 2023-01-23 (×2): qty 1

## 2023-01-23 MED ORDER — INSULIN ASPART 100 UNIT/ML IJ SOLN
0.0000 [IU] | Freq: Three times a day (TID) | INTRAMUSCULAR | Status: DC
  Administered 2023-01-23: 8 [IU] via SUBCUTANEOUS
  Administered 2023-01-23: 5 [IU] via SUBCUTANEOUS
  Administered 2023-01-23: 11 [IU] via SUBCUTANEOUS
  Administered 2023-01-23 – 2023-01-24 (×2): 8 [IU] via SUBCUTANEOUS
  Administered 2023-01-24: 5 [IU] via SUBCUTANEOUS

## 2023-01-23 MED ORDER — TAMSULOSIN HCL 0.4 MG PO CAPS
0.4000 mg | ORAL_CAPSULE | Freq: Every day | ORAL | Status: DC
  Administered 2023-01-23: 0.4 mg via ORAL
  Filled 2023-01-23: qty 1

## 2023-01-23 MED ORDER — ASPIRIN 81 MG PO TBEC
81.0000 mg | DELAYED_RELEASE_TABLET | Freq: Every evening | ORAL | Status: DC
  Administered 2023-01-23: 81 mg via ORAL
  Filled 2023-01-23: qty 1

## 2023-01-23 MED ORDER — LISINOPRIL-HYDROCHLOROTHIAZIDE 20-25 MG PO TABS: 1.0000 | ORAL_TABLET | Freq: Every morning | ORAL | Status: DC

## 2023-01-23 MED ORDER — SODIUM CHLORIDE 0.9 % IV SOLN
1.0000 g | INTRAVENOUS | Status: DC
  Administered 2023-01-23: 1 g via INTRAVENOUS
  Filled 2023-01-23: qty 10

## 2023-01-23 MED ORDER — LISINOPRIL 20 MG PO TABS
20.0000 mg | ORAL_TABLET | Freq: Every day | ORAL | Status: DC
  Administered 2023-01-23 – 2023-01-24 (×2): 20 mg via ORAL
  Filled 2023-01-23 (×2): qty 1

## 2023-01-23 MED ORDER — HYDRALAZINE HCL 20 MG/ML IJ SOLN: 10.0000 mg | Freq: Four times a day (QID) | INTRAMUSCULAR | Status: DC | PRN

## 2023-01-23 MED ORDER — SODIUM CHLORIDE 0.9 % IV SOLN: INTRAVENOUS | Status: DC | PRN

## 2023-01-23 MED ORDER — HYDROCHLOROTHIAZIDE 25 MG PO TABS
25.0000 mg | ORAL_TABLET | Freq: Every day | ORAL | Status: DC
  Administered 2023-01-23 – 2023-01-24 (×2): 25 mg via ORAL
  Filled 2023-01-23 (×2): qty 1

## 2023-01-23 MED ORDER — AMLODIPINE BESYLATE 10 MG PO TABS
10.0000 mg | ORAL_TABLET | Freq: Every morning | ORAL | Status: DC
  Administered 2023-01-23 – 2023-01-24 (×2): 10 mg via ORAL
  Filled 2023-01-23 (×2): qty 1

## 2023-01-23 MED ORDER — HYDRALAZINE HCL 50 MG PO TABS
50.0000 mg | ORAL_TABLET | Freq: Three times a day (TID) | ORAL | Status: DC
  Administered 2023-01-23 – 2023-01-24 (×4): 50 mg via ORAL
  Filled 2023-01-23 (×4): qty 1

## 2023-01-23 MED ORDER — ENOXAPARIN SODIUM 40 MG/0.4ML IJ SOSY
40.0000 mg | PREFILLED_SYRINGE | INTRAMUSCULAR | Status: DC
  Administered 2023-01-23 – 2023-01-24 (×2): 40 mg via SUBCUTANEOUS
  Filled 2023-01-23 (×2): qty 0.4

## 2023-01-23 MED ORDER — CARVEDILOL 12.5 MG PO TABS
12.5000 mg | ORAL_TABLET | Freq: Two times a day (BID) | ORAL | Status: DC
  Administered 2023-01-23 – 2023-01-24 (×3): 12.5 mg via ORAL
  Filled 2023-01-23 (×3): qty 1

## 2023-01-23 NOTE — H&P (Addendum)
History and Physical    [name] JXB:[id number] DOB: [birth year] DOA: 01/22/2023 DOS: the patient was seen and examined on 01/23/2023 PCP: Joaquim Nam, MD  Patient coming from: Home  Chief Complaint:  Chief Complaint  Patient presents with   Near Syncope   Tremors   HPI: [name] is a 88 y.o. male with medical history significant of combined systolic and diastolic CHF, HTN, CAD, T2DM, BPH, anxiety with dizziness and tremors.   Son at bedside who lives with him collaborates on history. Patient states that he has been feeling dizzy since he woke up in the morning.  When he sat down in the kitchen table had generalized tremors.  Never lost consciousness.  No bowel or bladder incontinence.  Has noted dysuria and also minimal hematuria this morning. No abdominal pain.  Has felt nauseous but no vomiting.  No diarrhea.  No fevers or chills.   On arrival to ED, he was afebrile, elevated BP of 164/80 on room air.  CBC without leukocytosis or anemia.  CMP is unremarkable.  Troponin mildly elevated but downward trending at 26 and 19.  EKG on my review in sinus rhythm with PVCs.  CT head was negative.  UA with small leukocyte, negative nitrate and few bacteria's.  He was given IV rocephin in the ED.  EDP also consulted neurology to evaluate for possible seizures.   Hospitalist then consulted for admission for possible syncope vs seizures.   Review of Systems: As mentioned in the history of present illness. All other systems reviewed and are negative. Past Medical History:  Diagnosis Date   Anxiety    Borderline hyperlipidemia    CAD (coronary artery disease)    Colon polyps    Complication of anesthesia    trouble urinating after anesthesia   Diverticulosis of colon    DJD (degenerative joint disease)    DM (diabetes mellitus) (HCC)    Adult onset  type 2   History of gastritis    History of kidney stones    surgery to remove   History of  pyelonephritis    History of syncope    HTN (hypertension)    Hypertrophy of prostate with urinary obstruction and other lower urinary tract symptoms (LUTS)    Melanoma (HCC)    back of neck   Melanoma (HCC) 2022   per dermatology   Myoclonus    Pancreatitis due to biliary obstruction several years ago   treated by Dr. Lina Sar   Past Surgical History:  Procedure Laterality Date   APPENDECTOMY     cartarized  nose blood vessel     left side   CATARACT EXTRACTION     RIGHT EYE   CHOLECYSTECTOMY     cystoscopy  04/17/11   stent placed   CYSTOSCOPY W/ URETERAL STENT PLACEMENT  04/17/2011   Procedure: CYSTOSCOPY WITH RETROGRADE PYELOGRAM/URETERAL STENT PLACEMENT;  Surgeon: Lindaann Slough, MD;  Location: [facility];  Service: Urology;  Laterality: Left;   CYSTOSCOPY WITH RETROGRADE PYELOGRAM, URETEROSCOPY AND STENT PLACEMENT Left 04/18/2013   Procedure: CYSTOSCOPY WITH LEFT RETROGRADE PYELOGRAM, LEFT URETEROSCOPY AND LASER LITHOTRIPSY LEFT STENT PLACEMENT;  Surgeon: Crecencio Mc, MD;  Location: [facility];  Service: Urology;  Laterality: Left;   HOLMIUM LASER APPLICATION Left 04/18/2013   Procedure: HOLMIUM LASER APPLICATION;  Surgeon: Crecencio Mc, MD;  Location: [facility];  Service: Urology;  Laterality: Left;   INCISION AND DRAINAGE ABSCESS Left 04/04/2020   Procedure: IWASHOUT OF LEFT ARM HEMATOMA;  Surgeon: Almond Lint, MD;  Location: [facility];  Service: General;  Laterality: Left;  left   IRRIGATION AND DEBRIDEMENT ABSCESS Left 10/05/2014   Procedure: Leighton Ruff D LEFT INDEX FINGER;  Surgeon: Knute Neu, MD;  Location: [facility];  Service: Plastics;  Laterality: Left;   KNEE SURGERY     bilateral ARTHROSCOPY X2 TO EACH KNEE   MELANOMA EXCISION  2018   MELANOMA EXCISION WITH SENTINEL LYMPH NODE BIOPSY Left 03/29/2020   Procedure: WIDE LOCAL EXCISION, ADVANCED FLAP CLOSURE LEFT ARM MELANOMA DEFECT WITH SENTINEL LYMPH NODE MAPPING AND BIOPSY;  Surgeon: Almond Lint, MD;  Location: [facility];  Service: General;   Laterality: Left;   S/P cysto & stents for kidnedy stone 11/08 by Dr. Wanda Plump     S/P ELap 1986 w/excision of leiomyoma @ GE junction, Meckle's divertic resected, & cholecystectomy     SHOULDER SURGERY  04/2005   left by Dr. Luiz Blare   Social History:  reports that he has never smoked. He has never used smokeless tobacco. He reports that he does not drink alcohol and does not use drugs.  Allergies  Allergen Reactions   Morphine Nausea Only, Other (See Comments) and Nausea And Vomiting    Can take with food and usually doesn't cause nausea   Atorvastatin     Intolerant - nausea/myalgias   Codeine Phosphate Nausea And Vomiting   Metformin And Related Diarrhea    Family History  Problem Relation Age of Onset   Heart attack Mother    Dementia Brother    Colon cancer Neg Hx    Prostate cancer Neg Hx     Prior to Admission medications   Medication Sig Start Date End Date Taking? Authorizing Provider  amLODipine (NORVASC) 10 MG tablet TAKE 1 TABLET BY MOUTH IN THE  MORNING 06/23/22  Yes Joaquim Nam, MD  aspirin EC 81 MG tablet Take 81 mg by mouth every evening.   Yes [provider]  carvedilol (COREG) 12.5 MG tablet TAKE 1 TABLET BY MOUTH TWICE  DAILY WITH MEALS 06/23/22  Yes Joaquim Nam, MD  COD LIVER OIL PO Take 1 capsule by mouth 2 (two) times daily.    Yes [provider]  cyanocobalamin (,VITAMIN B-12,) 1000 MCG/ML injection Inject 1 mL (1,000 mcg total) into the muscle every 30 (thirty) days. Dispense 3 of the 1 mL vials. 10/29/18  Yes Joaquim Nam, MD  diphenhydrAMINE HCl (BENADRYL ALLERGY PO) Take 1 each by mouth at bedtime. Liquid Gels   Yes [provider]  glimepiride (AMARYL) 4 MG tablet Take 0.5 tablets by mouth daily with breakfast.   Yes [provider]  hydrALAZINE (APRESOLINE) 50 MG tablet TAKE 1 TABLET BY MOUTH 3 TIMES  DAILY 06/23/22  Yes Joaquim Nam, MD  insulin glargine, 1 Unit Dial, ([name] SOLOSTAR) 300 UNIT/ML  Solostar Pen INJECT SUBCUTANEOUSLY 16  UNITS AT BEDTIME Patient taking differently: Inject 16 Units into the skin at bedtime. 01/02/23  Yes Joaquim Nam, MD  insulin lispro (HUMALOG KWIKPEN) 100 UNIT/ML KwikPen INJECT SUBCUTANEOUSLY 4-10 UNITS 3 TIMES DAILY BEFORE MEALS PER  SLIDING SCALE: 151-200 = 4 UN,  201-250 = 6 UN, 251-300 = 8 UN,  AND OVER 300 = 10 UN 01/02/23  Yes Joaquim Nam, MD  lisinopril-hydrochlorothiazide (ZESTORETIC) 20-25 MG tablet TAKE 1 TABLET BY MOUTH IN THE  MORNING 06/23/22  Yes Joaquim Nam, MD  meclizine (ANTIVERT) 12.5 MG tablet Take 1 tablet (12.5 mg total) by mouth 3 (  three) times daily as needed for dizziness. 01/02/23  Yes Cardama, Amadeo Garnet, MD  naproxen sodium (ALEVE) 220 MG tablet Take 220 mg by mouth at bedtime.   Yes [provider]  potassium chloride SA (KLOR-CON M) 20 MEQ tablet TAKE 1 TABLET BY MOUTH DAILY 06/23/22  Yes Joaquim Nam, MD  tamsulosin (FLOMAX) 0.4 MG CAPS capsule TAKE 1 CAPSULE BY MOUTH DAILY  AFTER SUPPER Patient taking differently: Take 0.4 mg by mouth daily after supper. 12/05/22  Yes Joaquim Nam, MD  traMADol (ULTRAM) 50 MG tablet TAKE 1 TABLET BY MOUTH EVERY 12 HOURS AS NEEDED FOR KNEE PAIN. Patient taking differently: Take 50 mg by mouth every 12 (twelve) hours as needed for moderate pain (pain score 4-6) or severe pain (pain score 7-10) (Knee Pain). 11/19/22  Yes Joaquim Nam, MD  Accu-Chek Softclix Lancets lancets TEST BLOOD SUGAR UP TO THREE TIMES DAILY 01/23/20   Joaquim Nam, MD  Blood Glucose Calibration (ACCU-CHEK AVIVA) SOLN Use as directed monthly. E11.59  Insulin dependent 12/06/18   Joaquim Nam, MD  Blood Glucose Monitoring Suppl (ACCU-CHEK AVIVA PLUS) w/Device KIT Check sugar up to 3 times daily. E11.59  Insulin dependent 11/30/18   Joaquim Nam, MD  Continuous Glucose Receiver (FREESTYLE LIBRE 3 READER) DEVI 1 each by Does not apply route as directed. 05/28/22   Joaquim Nam, MD   Continuous Glucose Sensor (FREESTYLE LIBRE 3 SENSOR) MISC Place 1 sensor on the skin every 14 days. Use to check glucose continuously 05/28/22   Joaquim Nam, MD  DROPLET PEN NEEDLES 30G X 8 MM MISC USE  WITH  INSULIN  PEN 02/03/19   Joaquim Nam, MD  glucose blood (ONETOUCH ULTRA) test strip USE TO CHECK UP TO 3 TIMES DAILY AS NEEDED 09/01/22   Joaquim Nam, MD  SYRINGE-NEEDLE, DISP, 3 ML (B-D 3CC LUER-LOK SYR 25GX1") 25G X 1" 3 ML MISC USE TO INJECT VITAMIN B12 MONTHLY. 08/22/19   Joaquim Nam, MD    Physical Exam: Vitals:   01/22/23 2300 01/22/23 2303 01/23/23 0200 01/23/23 0230  BP: (!) 166/79  (!) 163/89 (!) 161/88  Pulse: 68  80 67  Resp: (!) 22  (!) 23 16  Temp:  98.2 F (36.8 C)    TempSrc:  Oral    SpO2: 93%  96% 95%  Weight:      Height:       Constitutional: NAD, calm, comfortable,obese male appearing younger than stated age lying in bed Eyes:  lids and conjunctivae normal ENMT: Mucous membranes are moist.  Neck: normal, supple Respiratory: clear to auscultation bilaterally, no wheezing, no crackles. Normal respiratory effort. No accessory muscle use.  Cardiovascular: Regular rate and rhythm, no murmurs / rubs / gallops. No extremity edema.  Abdomen: Soft, nontender nondistended  musculoskeletal: no clubbing / cyanosis. No joint deformity upper and lower extremities. Good ROM, no contractures. Normal muscle tone.  Skin: no rashes, lesions, ulcers. No induration Neurologic: CN 2-12 grossly intact. Sensation intact, Strength 5/5 in all 4.  Psychiatric: Normal judgment and insight. Alert and oriented x 3. Normal mood. Data Reviewed:  See HPI  Assessment and Plan: * Tremor -Pt reports dizziness this morning with an episode of generalized tremors and here in ED witnessed by son. No loss of consciousness.  No bowel or bladder incontinence. CBC and BMP otherwise unremarkable other than possible UTI with UA with small leukocyte, negative nitrate and few bacteria.   He also reports  dysuria. -Doubts initial ED concerns of syncope or seizure.  Patient has also been evaluated by neurology Dr. Otelia Limes and awaiting his full consult. -Continue IV Rocephin  UTI (urinary tract infection) Continue IV Rocephin pending urine culture  Obesity (BMI 30-39.9) BMI of 30  Type 2 diabetes mellitus with vascular disease (HCC) - Uncontrolled with last hemoglobin A1c of 8.3 in 12/2022 - Keep on sliding scale insulin  HLD (hyperlipidemia) - Not on statin due to myalgias  Osteoarthritis Continue home NSAIDs  Essential hypertension Elevated - Continue home amlodipine, Coreg, hydralazine, lisinopril-HCTZ      Advance Care Planning:Full  Consults: neurology  Family Communication: son at bedside  Severity of Illness: The appropriate patient status for this patient is OBSERVATION. Observation status is judged to be reasonable and necessary in order to provide the required intensity of service to ensure the patient's safety. The patient's presenting symptoms, physical exam findings, and initial radiographic and laboratory data in the context of their medical condition is felt to place them at decreased risk for further clinical deterioration. Furthermore, it is anticipated that the patient will be medically stable for discharge from the hospital within 2 midnights of admission.   Author: Anselm Jungling, DO 01/23/2023 3:14 AM  For on call review [email or web address].

## 2023-01-23 NOTE — Assessment & Plan Note (Signed)
BMI of 30

## 2023-01-23 NOTE — Progress Notes (Signed)
PROGRESS NOTE    [name]  QIO:[id number] DOB: 09-Jun-1934 DOA: 01/22/2023 PCP: Joaquim Nam, MD  Chief Complaint  Patient presents with   Near Syncope   Tremors    Hospital Course:  BAYLE Mathews is 88 y.o. male with CAD, hyperlipidemia, CAD, diverticulosis, DJD, diabetes, gastritis, history of kidney stones and pyelonephritis, hypertension, hypertrophy of prostate with urinary obstruction, history of melanoma, myoclonus, who presented to the ED on 12/12 after tremulous episode accompanied by dizziness.  Patient does endorse a history of tremors but he had concerned that this was seizure episode.  Per EMS patient had possible witnessed seizure-like event during which she lost all function of his body, slid down the chair, but stayed alert.  Did not have any loss of bowel or bladder.  In the ED CT head was negative, UA with small leukocytes, negative nitrates and few bacteria.  He was given IV Rocephin.  Neurology was consulted by ED PA.  Subjective: On initial evaluation this morning patient was resting easily in bed.  He had no acute complaints.   Later in the afternoon patient had tremulous event while he was on EEG monitor.  Neurology reportedly reviewed this period of time and reported EEG did not note any seizure activity. Lactic and CK ordered. I went to evaluate the patient immediately after the event.  He was calm, alert, oriented.  While discussing our plans he began shaking again.  He was redirectable throughout the event.  He was able to make eye contact and answer questions and seemingly control the shaking.  Objective: Vitals:   01/23/23 0200 01/23/23 0230 01/23/23 0410 01/23/23 0725  BP: (!) 163/89 (!) 161/88 (!) 161/87 (!) 147/79  Pulse: 80 67 71 70  Resp: (!) 23 16 (!) 22 17  Temp:   97.8 F (36.6 C) 98 F (36.7 C)  TempSrc:   Oral Oral  SpO2: 96% 95% 95% 96%  Weight:   99.9 kg   Height:   5\' 11"  (1.803 m)     Intake/Output Summary (Last 24 hours) at  01/23/2023 3244 Last data filed at 01/22/2023 1801 Gross per 24 hour  Intake 100.18 ml  Output --  Net 100.18 ml   Filed Weights   01/22/23 1331 01/23/23 0410  Weight: 103.1 kg 99.9 kg    Examination: General exam: Appears calm and comfortable, NAD  Respiratory system: No work of breathing, symmetric chest wall expansion Cardiovascular system: S1 & S2 heard, RRR.  Gastrointestinal system: Abdomen is nondistended, soft and nontender.  Neuro: Alert and oriented. No focal neurological deficits. Extremities: Symmetric, expected ROM Skin: No rashes, lesions Psychiatry: Demonstrates appropriate judgement and insight. Mood & affect appropriate for situation.   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  Patient was easily redirected during event, was not postictal, maintained consciousness, no loss of bowel or bladder. - CK and lactic acid were ordered, 0.5 mg p.o. Ativan administered.  Patient did become upset after the event discussing his wife and daughter who have died.  Appears to be anxiety component. Monitor response --Has completed an EEG today, pending official read. - Unlikely seizure activity - Neurology following - Head  CT negative -Orthostatics negative - Troponin downtrending on trend  Possible UTI - UA on arrival with small leuks, negative nitrates, few bacteria.  Patient does report dysuria. - Status post IV Rocephin, follow urine cultures.  Continue Rocephin for now  Type 2 diabetes uncontrolled, with vascular disease - Hemoglobin A1c 8.3% - Sliding scale insulin, titrate up as tolerated  Hyperlipidemia - Not on statin due to  history of myalgias  Osteoarthritis - On home dose NSAIDs - Kidney function normal - Will continue home meds  Hypertension - Resume home meds, titrate as needed  BPH - Continue Flomax  BMI 30 - Outpatient follow up for lifestyle modification and risk factor management     DVT prophylaxis: lovenox   Code Status: Full Code Family Communication: Son at bedside for discussion of careplan Disposition:  Status is: Observation The patient remains OBS appropriate and will d/c before 2 midnights.    Consultants:  Neuro    Procedures:  N/a  Antimicrobials:  Anti-infectives (From admission, onward)    Start     Dose/Rate Route Frequency Ordered Stop   01/23/23 1700  cefTRIAXone (ROCEPHIN) 1 g in sodium chloride 0.9 % 100 mL IVPB        1 g 200 mL/hr over 30 Minutes Intravenous Every 24 hours 01/23/23 0300     01/22/23 1730  cefTRIAXone (ROCEPHIN) 1 g in sodium chloride 0.9 % 100 mL IVPB        1 g 200 mL/hr over 30 Minutes Intravenous  Once 01/22/23 1719 01/22/23 1801       Data Reviewed: I have personally reviewed following labs and imaging studies CBC: Recent Labs  Lab 01/22/23 1349 01/22/23 1401  WBC 8.5  --   NEUTROABS 5.3  --   HGB 13.7 13.9  HCT 39.8 41.0  MCV 91.9  --   PLT 318  --    Basic Metabolic Panel: Recent Labs  Lab 01/22/23 1349 01/22/23 1401  NA 137 136  K 3.5 3.5  CL 101 100  CO2 24  --   GLUCOSE 175* 186*  BUN 20 22  CREATININE 1.18 1.20  CALCIUM 9.9  --   MG 1.8  --    GFR: Estimated Creatinine Clearance: 53.2 mL/min (by C-G formula based on SCr of 1.2 mg/dL). Liver Function Tests: Recent Labs  Lab 01/22/23 1349  AST 18  ALT 17  ALKPHOS 54  BILITOT 0.6  PROT 6.3*  ALBUMIN 3.5   CBG: Recent Labs  Lab 01/22/23 1345 01/22/23 1816 01/23/23 0418  GLUCAP 186* 235* 317*    No results found for this or any previous visit (from the past 240 hours).   Radiology Studies: CT Head Wo Contrast Result Date:  01/22/2023 CLINICAL DATA:  Provided history: Syncope/presyncope, cerebrovascular cause suspected. Syncope versus seizure. EXAM: CT HEAD WITHOUT CONTRAST TECHNIQUE: Contiguous axial images were obtained from the base of the skull through the vertex without intravenous contrast. RADIATION DOSE REDUCTION: This exam was performed according to the departmental dose-optimization program which includes automated exposure control, adjustment of the mA and/or kV according to patient size and/or use of iterative reconstruction technique. COMPARISON:  Head CT 05/06/2022. FINDINGS: Brain: Generalized cerebral atrophy. There is no acute intracranial hemorrhage. No demarcated cortical infarct. No extra-axial fluid collection. No evidence of an intracranial mass. No midline shift. Vascular: No hyperdense vessel.  Atherosclerotic calcifications. Skull: No calvarial fracture. Sinuses/Orbits: No mass or acute finding within the imaged orbits. Mild mucosal thickening within the left maxillary sinus. IMPRESSION:  1. No evidence of an acute intracranial abnormality. 2. Generalized cerebral atrophy. Electronically Signed   By: Jackey Loge D.O.   On: 01/22/2023 16:07   DG Chest Port 1 View Result Date: 01/22/2023 CLINICAL DATA:  Lightheadedness. History of tremors. Possible seizure. EXAM: PORTABLE CHEST 1 VIEW COMPARISON:  Chest radiographs 11/17/2022 and 04/02/2020 FINDINGS: Cardiac silhouette and mediastinal contours within normal limits. Moderate atherosclerotic calcifications within the aortic arch. Unchanged chronic linear scarring overlying the left costophrenic angle. No acute airspace opacity. No pleural effusion pneumothorax. Moderate bilateral glenohumeral osteoarthritis. Surgical clips overlie the left upper abdominal quadrant and left axilla. IMPRESSION: 1. No active disease. 2. Unchanged chronic linear scarring overlying the left costophrenic angle. Electronically Signed   By: Neita Garnet M.D.   On: 01/22/2023 15:10     Scheduled Meds:  amLODipine  10 mg Oral q morning   aspirin EC  81 mg Oral QPM   carvedilol  12.5 mg Oral BID WC   enoxaparin (LOVENOX) injection  40 mg Subcutaneous Q24H   hydrALAZINE  50 mg Oral TID   hydrochlorothiazide  25 mg Oral Daily   insulin aspart  0-15 Units Subcutaneous TID PC & HS   lisinopril  20 mg Oral Daily   naproxen  250 mg Oral QHS   potassium chloride SA  20 mEq Oral Daily   tamsulosin  0.4 mg Oral QPC supper   Continuous Infusions:  cefTRIAXone (ROCEPHIN)  IV       LOS: 0 days    Time spent:   Debarah Crape, DO [facility]  To contact the attending physician between 7A-7P please use Epic Chat. To contact the covering physician during after hours 7P-7A, please review Amion.   01/23/2023, 8:21 AM   *This document has been created with the assistance of dictation software. Please excuse typographical errors. *

## 2023-01-23 NOTE — Assessment & Plan Note (Signed)
Continue IV Rocephin pending urine culture

## 2023-01-23 NOTE — ED Notes (Addendum)
ED TO INPATIENT HANDOFF REPORT  ED Nurse Name and Phone #: Manus Gunning, RN   S Name/Age/Gender [name] 88 y.o. male Room/Bed: 045C/045C  Code Status   Code Status: Full Code  Home/SNF/Other Home Patient oriented to: self, place, time, and situation Is this baseline? Yes   Triage Complete: Triage complete  Chief Complaint Syncope [R55]  Triage Note Pt arrived via [facility] from home for c/o HTN, dizziness and tremors started today. Pt told EMS he has a hx of tremors. Per EMS, pt had a possible seizure, pt lost all functions of body, slid down in chair, but stayed alert.   Allergies Allergies  Allergen Reactions   Morphine Nausea Only, Other (See Comments) and Nausea And Vomiting    Can take with food and usually doesn't cause nausea   Atorvastatin     Intolerant - nausea/myalgias   Codeine Phosphate Nausea And Vomiting   Metformin And Related Diarrhea    Level of Care/Admitting Diagnosis ED Disposition     ED Disposition  Admit   Condition  --   Comment  Hospital Area: [facility] [[id number]]  Level of Care: Telemetry Cardiac [103]  May place patient in observation at [facility] or [facility] if equivalent level of care is available:: No  Covid Evaluation: Asymptomatic - no recent exposure (last 10 days) testing not required  Diagnosis: Syncope [206001]  Admitting Physician: Anselm Jungling [[id number]]  Attending Physician: Anselm Jungling [[id number]]          B Medical/Surgery History Past Medical History:  Diagnosis Date   Anxiety    Borderline hyperlipidemia    CAD (coronary artery disease)    Colon polyps    Complication of anesthesia    trouble urinating after anesthesia   Diverticulosis of colon    DJD (degenerative joint disease)    DM (diabetes mellitus) (HCC)    Adult onset  type 2   History of gastritis    History of kidney stones    surgery to remove   History of pyelonephritis    History of syncope    HTN (hypertension)     Hypertrophy of prostate with urinary obstruction and other lower urinary tract symptoms (LUTS)    Melanoma (HCC)    back of neck   Melanoma (HCC) 2022   per dermatology   Myoclonus    Pancreatitis due to biliary obstruction several years ago   treated by Dr. Lina Sar   Past Surgical History:  Procedure Laterality Date   APPENDECTOMY     cartarized  nose blood vessel     left side   CATARACT EXTRACTION     RIGHT EYE   CHOLECYSTECTOMY     cystoscopy  04/17/11   stent placed   CYSTOSCOPY W/ URETERAL STENT PLACEMENT  04/17/2011   Procedure: CYSTOSCOPY WITH RETROGRADE PYELOGRAM/URETERAL STENT PLACEMENT;  Surgeon: Lindaann Slough, MD;  Location: [facility];  Service: Urology;  Laterality: Left;   CYSTOSCOPY WITH RETROGRADE PYELOGRAM, URETEROSCOPY AND STENT PLACEMENT Left 04/18/2013   Procedure: CYSTOSCOPY WITH LEFT RETROGRADE PYELOGRAM, LEFT URETEROSCOPY AND LASER LITHOTRIPSY LEFT STENT PLACEMENT;  Surgeon: Crecencio Mc, MD;  Location: [facility];  Service: Urology;  Laterality: Left;   HOLMIUM LASER APPLICATION Left 04/18/2013   Procedure: HOLMIUM LASER APPLICATION;  Surgeon: Crecencio Mc, MD;  Location: [facility];  Service: Urology;  Laterality: Left;   INCISION AND DRAINAGE ABSCESS Left 04/04/2020   Procedure: Angelina Pih OF LEFT ARM HEMATOMA;  Surgeon: Almond Lint, MD;  Location: [facility];  Service: General;  Laterality: Left;  left   IRRIGATION AND DEBRIDEMENT ABSCESS Left 10/05/2014   Procedure: IAND D LEFT INDEX FINGER;  Surgeon: Knute Neu, MD;  Location: [facility];  Service: Plastics;  Laterality: Left;   KNEE SURGERY     bilateral ARTHROSCOPY X2 TO EACH KNEE   MELANOMA EXCISION  2018   MELANOMA EXCISION WITH SENTINEL LYMPH NODE BIOPSY Left 03/29/2020   Procedure: WIDE LOCAL EXCISION, ADVANCED FLAP CLOSURE LEFT ARM MELANOMA DEFECT WITH SENTINEL LYMPH NODE MAPPING AND BIOPSY;  Surgeon: Almond Lint, MD;  Location: [facility];  Service: General;  Laterality: Left;   S/P cysto & stents for kidnedy stone 11/08 by  Dr. Wanda Plump     S/P ELap 1986 w/excision of leiomyoma @ GE junction, Meckle's divertic resected, & cholecystectomy     SHOULDER SURGERY  04/2005   left by Dr. Louellen Molder IV Location/Drains/Wounds Patient Lines/Drains/Airways Status     Active Line/Drains/Airways     Name Placement date Placement time Site Days   Peripheral IV 01/22/23 18 G Anterior;Right Forearm 01/22/23  --  Forearm  1            Intake/Output Last 24 hours  Intake/Output Summary (Last 24 hours) at 01/23/2023 0305 Last data filed at 01/22/2023 1801 Gross per 24 hour  Intake 100.18 ml  Output --  Net 100.18 ml    Labs/Imaging Results for orders placed or performed during the hospital encounter of 01/22/23 (from the past 48 hours)  CBG monitoring, ED     Status: Abnormal   Collection Time: 01/22/23  1:45 PM  Result Value Ref Range   Glucose-Capillary 186 (H) 70 - 99 mg/dL    Comment: Glucose reference range applies only to samples taken after fasting for at least 8 hours.  Comprehensive metabolic panel     Status: Abnormal   Collection Time: 01/22/23  1:49 PM  Result Value Ref Range   Sodium 137 135 - 145 mmol/L   Potassium 3.5 3.5 - 5.1 mmol/L   Chloride 101 98 - 111 mmol/L   CO2 24 22 - 32 mmol/L   Glucose, Bld 175 (H) 70 - 99 mg/dL    Comment: Glucose reference range applies only to samples taken after fasting for at least 8 hours.   BUN 20 8 - 23 mg/dL   Creatinine, Ser 2.53 0.61 - 1.24 mg/dL   Calcium 9.9 8.9 - 66.4 mg/dL   Total Protein 6.3 (L) 6.5 - 8.1 g/dL   Albumin 3.5 3.5 - 5.0 g/dL   AST 18 15 - 41 U/L   ALT 17 0 - 44 U/L   Alkaline Phosphatase 54 38 - 126 U/L   Total Bilirubin 0.6 <1.2 mg/dL   GFR, Estimated >40 >34 mL/min    Comment: (NOTE) Calculated using the CKD-EPI Creatinine Equation (2021)    Anion gap 12 5 - 15    Comment: Performed at [facility], [address], [place], [place] [address]  Troponin I (High Sensitivity)     Status: Abnormal   Collection  Time: 01/22/23  1:49 PM  Result Value Ref Range   Troponin I (High Sensitivity) 26 (H) <18 ng/L    Comment: (NOTE) Elevated high sensitivity troponin I (hsTnI) values and significant  changes across serial measurements may suggest ACS but many other  chronic and acute conditions are known to elevate hsTnI results.  Refer to the "Links" section for chest pain algorithms and  additional  guidance. Performed at [facility], [address], [place], [place] [address]   CBC with Differential     Status: None   Collection Time: 01/22/23  1:49 PM  Result Value Ref Range   WBC 8.5 4.0 - 10.5 K/uL   RBC 4.33 4.22 - 5.81 MIL/uL   Hemoglobin 13.7 13.0 - 17.0 g/dL   HCT 60.4 54.0 - 98.1 %   MCV 91.9 80.0 - 100.0 fL   MCH 31.6 26.0 - 34.0 pg   MCHC 34.4 30.0 - 36.0 g/dL   RDW 19.1 47.8 - 29.5 %   Platelets 318 150 - 400 K/uL   nRBC 0.0 0.0 - 0.2 %   Neutrophils Relative % 61 %   Neutro Abs 5.3 1.7 - 7.7 K/uL   Lymphocytes Relative 25 %   Lymphs Abs 2.1 0.7 - 4.0 K/uL   Monocytes Relative 9 %   Monocytes Absolute 0.8 0.1 - 1.0 K/uL   Eosinophils Relative 3 %   Eosinophils Absolute 0.3 0.0 - 0.5 K/uL   Basophils Relative 1 %   Basophils Absolute 0.1 0.0 - 0.1 K/uL   Immature Granulocytes 1 %   Abs Immature Granulocytes 0.04 0.00 - 0.07 K/uL    Comment: Performed at [facility], [address], [place], [place] [address]  Magnesium     Status: None   Collection Time: 01/22/23  1:49 PM  Result Value Ref Range   Magnesium 1.8 1.7 - 2.4 mg/dL    Comment: Performed at [facility], [address], [place], [place] [address]  Brain natriuretic peptide     Status: Abnormal   Collection Time: 01/22/23  1:49 PM  Result Value Ref Range   B Natriuretic Peptide 247.4 (H) 0.0 - 100.0 pg/mL    Comment: Performed at [facility], [address] [place], [place] [address]  D-dimer, quantitative     Status: None   Collection Time: 01/22/23  1:49 PM  Result Value Ref Range    D-Dimer, Quant 0.49 0.00 - 0.50 ug/mL-FEU    Comment: (NOTE) At the manufacturer cut-off value of 0.5 g/mL FEU, this assay has a negative predictive value of 95-100%.This assay is intended for use in conjunction with a clinical pretest probability (PTP) assessment model to exclude pulmonary embolism (PE) and deep venous thrombosis (DVT) in outpatients suspected of PE or DVT. Results should be correlated with clinical presentation. Performed at [facility], [address] [place], [place] [address]   I-stat chem 8, ED     Status: Abnormal   Collection Time: 01/22/23  2:01 PM  Result Value Ref Range   Sodium 136 135 - 145 mmol/L   Potassium 3.5 3.5 - 5.1 mmol/L   Chloride 100 98 - 111 mmol/L   BUN 22 8 - 23 mg/dL   Creatinine, Ser 1.32 0.61 - 1.24 mg/dL   Glucose, Bld 440 (H) 70 - 99 mg/dL    Comment: Glucose reference range applies only to samples taken after fasting for at least 8 hours.   Calcium, Ion 1.19 1.15 - 1.40 mmol/L   TCO2 24 22 - 32 mmol/L   Hemoglobin 13.9 13.0 - 17.0 g/dL   HCT 10.2 72.5 - 36.6 %  Urinalysis, Routine w reflex microscopic -Urine, Clean Catch     Status: Abnormal   Collection Time: 01/22/23  2:08 PM  Result Value Ref Range   Color, Urine YELLOW YELLOW   APPearance CLEAR CLEAR  Specific Gravity, Urine 1.010 1.005 - 1.030   pH 9.0 (H) 5.0 - 8.0   Glucose, UA NEGATIVE NEGATIVE mg/dL   Hgb urine dipstick SMALL (A) NEGATIVE   Bilirubin Urine NEGATIVE NEGATIVE   Ketones, ur NEGATIVE NEGATIVE mg/dL   Protein, ur 536 (A) NEGATIVE mg/dL   Nitrite NEGATIVE NEGATIVE   Leukocytes,Ua SMALL (A) NEGATIVE   RBC / HPF 21-50 0 - 5 RBC/hpf   WBC, UA 21-50 0 - 5 WBC/hpf   Bacteria, UA FEW (A) NONE SEEN   Squamous Epithelial / HPF 0-5 0 - 5 /HPF    Comment: Performed at [facility], [address], [place], [place] [address]  Troponin I (High Sensitivity)     Status: Abnormal   Collection Time: 01/22/23  4:11 PM  Result Value Ref Range    Troponin I (High Sensitivity) 19 (H) <18 ng/L    Comment: (NOTE) Elevated high sensitivity troponin I (hsTnI) values and significant  changes across serial measurements may suggest ACS but many other  chronic and acute conditions are known to elevate hsTnI results.  Refer to the "Links" section for chest pain algorithms and additional  guidance. Performed at [facility], [address], [place], [place] [address]   CBG monitoring, ED     Status: Abnormal   Collection Time: 01/22/23  6:16 PM  Result Value Ref Range   Glucose-Capillary 235 (H) 70 - 99 mg/dL    Comment: Glucose reference range applies only to samples taken after fasting for at least 8 hours.   CT Head Wo Contrast Result Date: 01/22/2023 CLINICAL DATA:  Provided history: Syncope/presyncope, cerebrovascular cause suspected. Syncope versus seizure. EXAM: CT HEAD WITHOUT CONTRAST TECHNIQUE: Contiguous axial images were obtained from the base of the skull through the vertex without intravenous contrast. RADIATION DOSE REDUCTION: This exam was performed according to the departmental dose-optimization program which includes automated exposure control, adjustment of the mA and/or kV according to patient size and/or use of iterative reconstruction technique. COMPARISON:  Head CT 05/06/2022. FINDINGS: Brain: Generalized cerebral atrophy. There is no acute intracranial hemorrhage. No demarcated cortical infarct. No extra-axial fluid collection. No evidence of an intracranial mass. No midline shift. Vascular: No hyperdense vessel.  Atherosclerotic calcifications. Skull: No calvarial fracture. Sinuses/Orbits: No mass or acute finding within the imaged orbits. Mild mucosal thickening within the left maxillary sinus. IMPRESSION: 1. No evidence of an acute intracranial abnormality. 2. Generalized cerebral atrophy. Electronically Signed   By: Jackey Loge D.O.   On: 01/22/2023 16:07   DG Chest Port 1 View Result Date: 01/22/2023 CLINICAL  DATA:  Lightheadedness. History of tremors. Possible seizure. EXAM: PORTABLE CHEST 1 VIEW COMPARISON:  Chest radiographs 11/17/2022 and 04/02/2020 FINDINGS: Cardiac silhouette and mediastinal contours within normal limits. Moderate atherosclerotic calcifications within the aortic arch. Unchanged chronic linear scarring overlying the left costophrenic angle. No acute airspace opacity. No pleural effusion pneumothorax. Moderate bilateral glenohumeral osteoarthritis. Surgical clips overlie the left upper abdominal quadrant and left axilla. IMPRESSION: 1. No active disease. 2. Unchanged chronic linear scarring overlying the left costophrenic angle. Electronically Signed   By: Neita Garnet M.D.   On: 01/22/2023 15:10    Pending Labs Unresulted Labs (From admission, onward)     Start     Ordered   01/23/23 0500  CBC  Tomorrow morning,   R        01/23/23 0300   01/22/23 1628  Urine Culture  Once,   URGENT  Question:  Indication  Answer:  Altered mental status (if no other cause identified)   01/22/23 1627            Vitals/Pain Today's Vitals   01/23/23 0115 01/23/23 0200 01/23/23 0230 01/23/23 0256  BP:  (!) 163/89 (!) 161/88   Pulse:  80 67   Resp:  (!) 23 16   Temp:      TempSrc:      SpO2:  96% 95%   Weight:      Height:      PainSc: 0-No pain  0-No pain 0-No pain    Isolation Precautions No active isolations  Medications Medications  enoxaparin (LOVENOX) injection 40 mg (has no administration in time range)  cefTRIAXone (ROCEPHIN) 1 g in sodium chloride 0.9 % 100 mL IVPB (has no administration in time range)  aspirin EC tablet 81 mg (has no administration in time range)  naproxen sodium (ALEVE) tablet 220 mg (has no administration in time range)  traMADol (ULTRAM) tablet 50 mg (has no administration in time range)  amLODipine (NORVASC) tablet 10 mg (has no administration in time range)  carvedilol (COREG) tablet 12.5 mg (has no administration in time range)   hydrALAZINE (APRESOLINE) tablet 50 mg (has no administration in time range)  lisinopril-hydrochlorothiazide (ZESTORETIC) 20-25 MG per tablet 1 tablet (has no administration in time range)  tamsulosin (FLOMAX) capsule 0.4 mg (has no administration in time range)  potassium chloride SA (KLOR-CON M) CR tablet 20 mEq (has no administration in time range)  cefTRIAXone (ROCEPHIN) 1 g in sodium chloride 0.9 % 100 mL IVPB (0 g Intravenous Stopped 01/22/23 1801)    Mobility walks     Focused Assessments     R Recommendations: See Admitting Provider Note  Report given to:   Additional Notes:  Arrives [facility] from home with ongoing HTN, dizziness and a tremor. Admits to a minimal baseline tremor but reported it had increased. While at home had slid from chair and experienced loss of bowel and bladder. No history of seizure. Tremor not described as seizure like activity. Troponin's minimally elevated to BNP. Pt alert, oriented, ambulatory at baseline but haws not walked while in ED. Pain free. Being admitted for possible syncopal episode

## 2023-01-23 NOTE — Plan of Care (Signed)

## 2023-01-23 NOTE — Plan of Care (Signed)
Pt able to stand pivot to bed and sometimes taking few steps.

## 2023-01-23 NOTE — Progress Notes (Signed)
EEG complete - results pending 

## 2023-01-23 NOTE — Procedures (Signed)
Patient Name: [name]  MRN: [id number]  Epilepsy Attending: Charlsie Quest  Referring Physician/Provider: Rejeana Brock, MD  Date: 01/23/2023 Duration: 24.37 mins  Patient history: 88 yo M. Per EMS, the patietn had a possible seizure, during which he lost all functions of body, slid down in chair, but stayed alert. The patient later stated to Hospitalist that he had been feeling dizzy since he woke up in the morning.  When he sat down in the kitchen table he had generalized tremors, but never lost consciousness and did not have any bowel or bladder incontinence. EEG to evaluate for seizure  Level of alertness: Awake  AEDs during EEG study: Ativan  Technical aspects: This EEG study was done with scalp electrodes positioned according to the 10-20 International system of electrode placement. Electrical activity was reviewed with band pass filter of 1-70Hz , sensitivity of 7 uV/mm, display speed of 51mm/sec with a 60Hz  notched filter applied as appropriate. EEG data were recorded continuously and digitally stored.  Video monitoring was available and reviewed as appropriate.  Description: The posterior dominant rhythm consists of 8Hz  activity of moderate voltage (25-35 uV) seen predominantly in posterior head regions, symmetric and reactive to eye opening and eye closing.   Patient was noted to have episodes of whole body tremor like movements intermittently throughout the study. Concomitant EEG before, during and after the event did not show any EEG changes suggest seizure.  Hyperventilation and photic stimulation were not performed.     IMPRESSION: This study is within normal limits. No seizures or epileptiform discharges were seen throughout the recording.  Patient was noted to have episodes of whole body tremor like movements intermittently throughout the study without concomitant EEG change. These events are NOT epileptic  [name]

## 2023-01-23 NOTE — Inpatient Diabetes Management (Addendum)
Inpatient Diabetes Program Recommendations  AACE/ADA: New Consensus Statement on Inpatient Glycemic Control (2015)  Target Ranges:  Prepandial:   less than 140 mg/dL      Peak postprandial:   less than 180 mg/dL (1-2 hours)      Critically ill patients:  140 - 180 mg/dL    Latest Reference Range & Units 01/22/23 13:45 01/22/23 18:16 01/23/23 04:18  Glucose-Capillary 70 - 99 mg/dL 409 (H) 811 (H) 914 (H)  11 units Novolog @0631   (H): Data is abnormally high    Admit with: Tremor/ UTI  History: DM, CHF  Home DM Meds: Amaryl 2 mg daily        [name] 16 units at bedtime        Humalog 4-10 units TID per SSI        Freestyle Libre 3 CGM  Current Orders: Novolog Moderate Correction Scale/ SSI (0-15 units) TID AC + HS     MD- Note pt takes [name] insulin at home  1. Please consider starting Semglee 12 units at bedtime (80% total home dose)  2. Adjust Novolog SSI to TID [facility] before meals (currently ordered after meals)    --Will follow patient during hospitalization--  Ambrose Finland RN, MSN, CDCES Diabetes Coordinator Inpatient Glycemic Control Team Team Pager: [phone] (8a-5p)

## 2023-01-23 NOTE — Assessment & Plan Note (Signed)
-   Not on statin due to myalgias

## 2023-01-23 NOTE — Progress Notes (Signed)
Patient has been noncompliant with fall precautions throughout shift. Patient has been impulsive and quickly standing and walking without assistance or walker to void.Patient refuses to stay in bed or recliner despite complaints of dizziness and tremors.Patient insists on sitting in small guest chair in room. Education has been provided many times throughout shift and patient placed back in bed or recliner, but patient moves to chair again despite use of bed alarm and other fall bundle precautions.Night shift RN informed and RN discussed with patient's son.

## 2023-01-23 NOTE — Progress Notes (Signed)
During EEG, patient had several minutes long episode of whole-body tremor like activity. Rapid response RN reviewed EEG video of this activity and stated this did not appear to be seizure-like. EEG tech discussed with Neurologist, who stated that EEG did not indicate seizure activity and no further orders were received. Dr. Rennis Chris was notified and came to assess patient. While MD assessing patient, another episode of full-body tremors occurred. MD states these are pseudo-seizures and order for Ativan received. MD also states to apply purewick due to patient's noncompliance with fall restrictions to include quickly standing without assistance to void.

## 2023-01-23 NOTE — Assessment & Plan Note (Addendum)
-  Pt reports dizziness this morning with an episode of generalized tremors and here in ED witnessed by son. No loss of consciousness.  No bowel or bladder incontinence. CBC and BMP otherwise unremarkable other than possible UTI with UA with small leukocyte, negative nitrate and few bacteria.  He also reports dysuria. -Doubts initial ED concerns of syncope or seizure.  Patient has also been evaluated by neurology Dr. Otelia Limes and awaiting his full consult. -Continue IV Rocephin

## 2023-01-23 NOTE — Assessment & Plan Note (Signed)
Continue home NSAIDs

## 2023-01-23 NOTE — TOC Initial Note (Signed)
Transition of Care [facility]) - Initial/Assessment Note    Patient Details  Name: [name] MRN: [id number] Date of Birth: Jun 11, 1934  Transition of Care [facility]) CM/SW Contact:    Leone Haven, RN Phone Number: 01/23/2023, 4:47 PM  Clinical Narrative:                 From home with son, has PCP , Crawford Givens and insurance on file, states has no HH services in place at this time , has a walker and a cane at home.  States son will transport him  home at [employer] and son  is support system, states gets medications from [facility].  Pta self ambulatory with walker.  Expected Discharge Plan: Home w Home Health Services Barriers to Discharge: Continued Medical Work up   Patient Goals and CMS Choice Patient states their goals for this hospitalization and ongoing recovery are:: return home   Choice offered to / list presented to : NA      Expected Discharge Plan and Services   Discharge Planning Services: CM Consult Post Acute Care Choice: NA Living arrangements for the past 2 months: Single Family Home                 DME Arranged: N/A DME Agency: NA       HH Arranged: NA          Prior Living Arrangements/Services Living arrangements for the past 2 months: Single Family Home Lives with:: Adult Children Patient language and need for interpreter reviewed:: Yes Do you feel safe going back to the place where you live?: Yes      Need for Family Participation in Patient Care: Yes (Comment) Care giver support system in place?: Yes (comment) Current home services: DME (walker and a cane) Criminal Activity/Legal Involvement Pertinent to Current Situation/Hospitalization: No - Comment as needed  Activities of Daily Living      Permission Sought/Granted Permission sought to share information with : Case Manager Permission granted to share information with : Yes, Verbal Permission Granted              Emotional Assessment Appearance:: Appears stated  age Attitude/Demeanor/Rapport: Engaged Affect (typically observed): Appropriate Orientation: : Oriented to Self, Oriented to Place, Oriented to  Time, Oriented to Situation Alcohol / Substance Use: Not Applicable Psych Involvement: No (comment)  Admission diagnosis:  Syncope [R55] Tremor [R25.1] Near syncope [R55] Patient Active Problem List   Diagnosis Date Noted   Obesity (BMI 30-39.9) 01/23/2023   Memory change 07/31/2022   Drug-induced myopathy 07/28/2021   Tachycardia 01/09/2021   DNR (do not resuscitate) 04/13/2020   Hematoma 04/05/2020   Epigastric abdominal pain 04/01/2020   History of melanoma 2022   Medicare annual wellness visit, subsequent 01/03/2019   Recurrent Epistaxis 03/02/2017   Vitamin B 12 deficiency 08/17/2015   Elevated TSH 10/06/2014   Unsteady gait 09/18/2014   Midline low back pain without sciatica 09/18/2014   Tremor 05/04/2014   Chronic combined systolic and diastolic congestive heart failure (HCC) 01/27/2014   Ataxia 01/10/2014   UTI (urinary tract infection) 01/02/2014   History of coronary artery disease    Chest pain 12/15/2013   Healthcare maintenance 11/10/2013   Advance care planning 11/10/2013   ED (erectile dysfunction) 06/24/2013   HLD (hyperlipidemia) 03/22/2012   Type 2 diabetes mellitus with vascular disease (HCC) 03/22/2012   Thyroid cyst 03/19/2011   BPH with obstruction/lower urinary tract symptoms 03/15/2010   GASTRITIS 02/21/2007   Anxiety state  02/15/2007   CAD (coronary artery disease) 02/15/2007   DIVERTICULOSIS OF COLON 02/15/2007   COLONIC POLYPS 02/12/2007   Essential hypertension 02/12/2007   RENAL CALCULUS 02/12/2007   Osteoarthritis 02/12/2007   PCP:  Joaquim Nam, MD Pharmacy:   [facility] - [place], [place] - [address] [place] [address] Phone: [phone] Fax: [phone]  [facility]) - [place], [place] - [address] [place] [place] [address] Phone: [phone] Fax: [phone]  [facility] - [place], [place] - [address] [place]  [address] Phone: [phone] Fax: [phone]     Social Drivers of Health (SDOH) Social History: SDOH Screenings   Food Insecurity: No Food Insecurity (01/23/2023)  Housing: Unknown (01/23/2023)  Transportation Needs: No Transportation Needs (01/23/2023)  Utilities: Not At Risk (01/23/2023)  Alcohol Screen: Low Risk  (07/22/2022)  Depression (PHQ2-9): Low Risk  (12/29/2022)  Financial Resource Strain: Low Risk  (07/22/2022)  Physical Activity: Sufficiently Active (07/22/2022)  Social Connections: Moderately Isolated (07/22/2022)  Stress: No Stress Concern Present (07/22/2022)  Tobacco Use: Low Risk  (01/22/2023)   SDOH Interventions:     Readmission Risk Interventions     No data to display

## 2023-01-23 NOTE — Assessment & Plan Note (Signed)
Elevated - Continue home amlodipine, Coreg, hydralazine, lisinopril-HCTZ

## 2023-01-23 NOTE — Assessment & Plan Note (Signed)
-   Uncontrolled with last hemoglobin A1c of 8.3 in 12/2022 - Keep on sliding scale insulin

## 2023-01-24 DIAGNOSIS — R251 Tremor, unspecified: Secondary | ICD-10-CM | POA: Diagnosis not present

## 2023-01-24 LAB — GLUCOSE, CAPILLARY
Glucose-Capillary: 295 mg/dL — ABNORMAL HIGH (ref 70–99)
Glucose-Capillary: 241 mg/dL — ABNORMAL HIGH (ref 70–99)

## 2023-01-24 MED ORDER — ESCITALOPRAM OXALATE 10 MG PO TABS: 10.0000 mg | ORAL_TABLET | Freq: Every day | ORAL | 0 refills | Status: DC

## 2023-01-24 MED ORDER — CEFDINIR 300 MG PO CAPS: 300.0000 mg | ORAL_CAPSULE | Freq: Two times a day (BID) | ORAL | 0 refills | Status: AC

## 2023-01-24 NOTE — Discharge Summary (Signed)
Physician Discharge Summary   [name] MRN: [id number] DOB: Jun 21, 1934  Admit date:     01/22/2023  Discharge date: 01/24/23  Discharge Physician: Debarah Crape   PCP: Joaquim Nam, MD   Recommendations at discharge:    Follow up with PCP to discuss these ongoing problems  Discharge Diagnoses: Principal Problem:   Tremor Active Problems:   UTI (urinary tract infection)   Essential hypertension   Osteoarthritis   HLD (hyperlipidemia)   Type 2 diabetes mellitus with vascular disease (HCC)   Obesity (BMI 30-39.9)  Resolved Problems:   * No resolved hospital problems. [facility] Hospital Course: [name] is a pleasant 88 year old male with CAD, hyperlipidemia, diverticulosis, DJD, diabetes, gastritis, history of kidney stones with pyelonephritis, hypertension, hypertrophy of prostate with urinary obstruction, history of melanoma presented to the ED on 12/12 after tremulous episode and dizziness.  Patient experienced tremors at home and had concern for seizure so his son brought him to the ED.  EMS witnessed a seizure-like event, but patient maintained consciousness throughout the event.  No postictal period.  He did not have any loss of bowel or bladder.  Workup was mostly negative.  Head CT unremarkable, CBC and CMP within normal limits.  UA was consistent with urine infection so patient was initiated on Rocephin.  Neurology was consulted.  Patient had an EEG on 12/13 during which time one of his tremulous events occurred.  There was no evidence of seizure or epileptic discharge on this EEG.  I also witnessed an episode after the EEG.  During his episode he was redirectable, maintained consciousness, had no loss of bowel or bladder, and was able to communicate with me throughout.  With deep breaths patient was able to control his movements.  Immediately after his tremors resolved, he began to get tearful discussing his wife who has passed away and also his daughter.  His son  was at bedside and reports that he feels his father has become increasingly anxious and depressed as of late.  By reevaluation on 12/14 patient is complaining of some chronic low back pain, but otherwise appears at baseline.  Vitals and labs remain normal. I had extensive discussion with the patient's son, Casimiro Needle, regarding this diagnosis of psychogenic nonepileptic seizure.  He endorses understanding.  The patient has been initiated on Lexapro which will need to be titrated by his primary care physician outpatient. He is also being sent home with an additional 5 days of antibiotics to cover his urinary tract infection.  Urine cultures at this time are growing gram-negative rods, will follow ID with susceptibilities and follow-up if alternative medication needs to be prescribed.   Consultants: Neuro Procedures performed: EEG  Disposition: Home Diet recommendation:  Discharge Diet Orders (From admission, onward)     Start     Ordered   01/24/23 0000  Diet - low sodium heart healthy        01/24/23 1324           Cardiac diet DISCHARGE MEDICATION: Allergies as of 01/24/2023       Reactions   Morphine Nausea Only, Other (See Comments), Nausea And Vomiting   Can take with food and usually doesn't cause nausea   Atorvastatin    Intolerant - nausea/myalgias   Codeine Phosphate Nausea And Vomiting   Metformin And Related Diarrhea        Medication List     TAKE these medications    Accu-Chek Aviva Plus w/Device Kit Check sugar  up to 3 times daily. E11.59  Insulin dependent   Accu-Chek Aviva Soln Use as directed monthly. E11.59  Insulin dependent   Accu-Chek Softclix Lancets lancets TEST BLOOD SUGAR UP TO THREE TIMES DAILY   amLODipine 10 MG tablet Commonly known as: NORVASC TAKE 1 TABLET BY MOUTH IN THE  MORNING   aspirin EC 81 MG tablet Take 81 mg by mouth every evening.   B-D 3CC LUER-LOK SYR 25GX1" 25G X 1" 3 ML Misc Generic drug: SYRINGE-NEEDLE (DISP) 3 ML USE  TO INJECT VITAMIN B12 MONTHLY.   BENADRYL ALLERGY PO Take 1 each by mouth at bedtime. Liquid Gels   carvedilol 12.5 MG tablet Commonly known as: COREG TAKE 1 TABLET BY MOUTH TWICE  DAILY WITH MEALS   cefdinir 300 MG capsule Commonly known as: OMNICEF Take 1 capsule (300 mg total) by mouth 2 (two) times daily for 5 days.   COD LIVER OIL PO Take 1 capsule by mouth 2 (two) times daily.   cyanocobalamin 1000 MCG/ML injection Commonly known as: VITAMIN B12 Inject 1 mL (1,000 mcg total) into the muscle every 30 (thirty) days. Dispense 3 of the 1 mL vials.   Droplet Pen Needles 30G X 8 MM Misc Generic drug: Insulin Pen Needle USE  WITH  INSULIN  PEN   escitalopram 10 MG tablet Commonly known as: Lexapro Take 1 tablet (10 mg total) by mouth daily.   FreeStyle Libre 3 Reader Golva 1 each by Does not apply route as directed.   FreeStyle Libre 3 Sensor Misc Place 1 sensor on the skin every 14 days. Use to check glucose continuously   glimepiride 4 MG tablet Commonly known as: AMARYL Take 0.5 tablets by mouth daily with breakfast.   hydrALAZINE 50 MG tablet Commonly known as: APRESOLINE TAKE 1 TABLET BY MOUTH 3 TIMES  DAILY   insulin lispro 100 UNIT/ML KwikPen Commonly known as: HumaLOG KwikPen INJECT SUBCUTANEOUSLY 4-10 UNITS 3 TIMES DAILY BEFORE MEALS PER  SLIDING SCALE: 151-200 = 4 UN,  201-250 = 6 UN, 251-300 = 8 UN,  AND OVER 300 = 10 UN   lisinopril-hydrochlorothiazide 20-25 MG tablet Commonly known as: ZESTORETIC TAKE 1 TABLET BY MOUTH IN THE  MORNING   meclizine 12.5 MG tablet Commonly known as: ANTIVERT Take 1 tablet (12.5 mg total) by mouth 3 (three) times daily as needed for dizziness.   naproxen sodium 220 MG tablet Commonly known as: ALEVE Take 220 mg by mouth at bedtime.   OneTouch Ultra test strip Generic drug: glucose blood USE TO CHECK UP TO 3 TIMES DAILY AS NEEDED   potassium chloride SA 20 MEQ tablet Commonly known as: KLOR-CON M TAKE 1 TABLET BY  MOUTH DAILY   tamsulosin 0.4 MG Caps capsule Commonly known as: FLOMAX TAKE 1 CAPSULE BY MOUTH DAILY  AFTER SUPPER What changed: See the new instructions.   [name] SoloStar 300 UNIT/ML Solostar Pen Generic drug: insulin glargine (1 Unit Dial) INJECT SUBCUTANEOUSLY 16  UNITS AT BEDTIME What changed:  how much to take how to take this when to take this additional instructions   traMADol 50 MG tablet Commonly known as: ULTRAM TAKE 1 TABLET BY MOUTH EVERY 12 HOURS AS NEEDED FOR KNEE PAIN. What changed: See the new instructions.        Discharge Exam: Filed Weights   01/22/23 1331 01/23/23 0410 01/24/23 0600  Weight: 103.1 kg 99.9 kg 99.8 kg   Constitutional:  Normal appearance. Non toxic-appearing.  HENT: Head Normocephalic and atraumatic.  Mucous  membranes are moist.  Eyes:  Extraocular intact. Conjunctivae normal. Pupils are equal, round, and reactive to light.  Cardiovascular: Rate and Rhythm: Normal rate and regular rhythm.  Pulmonary: Non labored, symmetric rise of chest wall.  Musculoskeletal:  Normal range of motion.  Skin: warm and dry. not jaundiced.  Neurological: No focal deficit present. alert. Oriented. Psychiatric: Mood and Affect congruent.    Condition at discharge: good  The results of significant diagnostics from this hospitalization (including imaging, microbiology, ancillary and laboratory) are listed below for reference.   Imaging Studies: EEG adult Result Date: 01/23/2023 Charlsie Quest, MD     01/23/2023  5:18 PM Patient Name: TYLAND REAGLE MRN: [id number] Epilepsy Attending: Charlsie Quest Referring Physician/Provider: Rejeana Brock, MD Date: 01/23/2023 Duration: 24.37 mins Patient history: 88 yo M. Per EMS, the patietn had a possible seizure, during which he lost all functions of body, slid down in chair, but stayed alert. The patient later stated to Hospitalist that he had been feeling dizzy since he woke up in the morning.  When he  sat down in the kitchen table he had generalized tremors, but never lost consciousness and did not have any bowel or bladder incontinence. EEG to evaluate for seizure Level of alertness: Awake AEDs during EEG study: Ativan Technical aspects: This EEG study was done with scalp electrodes positioned according to the 10-20 International system of electrode placement. Electrical activity was reviewed with band pass filter of 1-70Hz , sensitivity of 7 uV/mm, display speed of 85mm/sec with a 60Hz  notched filter applied as appropriate. EEG data were recorded continuously and digitally stored.  Video monitoring was available and reviewed as appropriate. Description: The posterior dominant rhythm consists of 8Hz  activity of moderate voltage (25-35 uV) seen predominantly in posterior head regions, symmetric and reactive to eye opening and eye closing. Patient was noted to have episodes of whole body tremor like movements intermittently throughout the study. Concomitant EEG before, during and after the event did not show any EEG changes suggest seizure. Hyperventilation and photic stimulation were not performed.   IMPRESSION: This study is within normal limits. No seizures or epileptiform discharges were seen throughout the recording. Patient was noted to have episodes of whole body tremor like movements intermittently throughout the study without concomitant EEG change. These events are NOT epileptic Charlsie Quest   CT Head Wo Contrast Result Date: 01/22/2023 CLINICAL DATA:  Provided history: Syncope/presyncope, cerebrovascular cause suspected. Syncope versus seizure. EXAM: CT HEAD WITHOUT CONTRAST TECHNIQUE: Contiguous axial images were obtained from the base of the skull through the vertex without intravenous contrast. RADIATION DOSE REDUCTION: This exam was performed according to the departmental dose-optimization program which includes automated exposure control, adjustment of the mA and/or kV according to patient  size and/or use of iterative reconstruction technique. COMPARISON:  Head CT 05/06/2022. FINDINGS: Brain: Generalized cerebral atrophy. There is no acute intracranial hemorrhage. No demarcated cortical infarct. No extra-axial fluid collection. No evidence of an intracranial mass. No midline shift. Vascular: No hyperdense vessel.  Atherosclerotic calcifications. Skull: No calvarial fracture. Sinuses/Orbits: No mass or acute finding within the imaged orbits. Mild mucosal thickening within the left maxillary sinus. IMPRESSION: 1. No evidence of an acute intracranial abnormality. 2. Generalized cerebral atrophy. Electronically Signed   By: Jackey Loge D.O.   On: 01/22/2023 16:07   DG Chest Port 1 View Result Date: 01/22/2023 CLINICAL DATA:  Lightheadedness. History of tremors. Possible seizure. EXAM: PORTABLE CHEST 1 VIEW COMPARISON:  Chest radiographs 11/17/2022 and 04/02/2020  FINDINGS: Cardiac silhouette and mediastinal contours within normal limits. Moderate atherosclerotic calcifications within the aortic arch. Unchanged chronic linear scarring overlying the left costophrenic angle. No acute airspace opacity. No pleural effusion pneumothorax. Moderate bilateral glenohumeral osteoarthritis. Surgical clips overlie the left upper abdominal quadrant and left axilla. IMPRESSION: 1. No active disease. 2. Unchanged chronic linear scarring overlying the left costophrenic angle. Electronically Signed   By: Neita Garnet M.D.   On: 01/22/2023 15:10    Microbiology: Results for orders placed or performed during the hospital encounter of 01/22/23  Urine Culture     Status: Abnormal (Preliminary result)   Collection Time: 01/22/23  2:45 PM   Specimen: Urine, Clean Catch  Result Value Ref Range Status   Specimen Description URINE, CLEAN CATCH  Final   Special Requests NONE  Final   Culture (A)  Final    70,000 COLONIES/mL GRAM NEGATIVE RODS IDENTIFICATION AND SUSCEPTIBILITIES TO FOLLOW Performed at [facility], [address], [place], [place] [address]    Report Status PENDING  Incomplete    Labs: CBC: Recent Labs  Lab 01/22/23 1349 01/22/23 1401 01/23/23 0815  WBC 8.5  --  7.2  NEUTROABS 5.3  --   --   HGB 13.7 13.9 13.3  HCT 39.8 41.0 39.2  MCV 91.9  --  92.7  PLT 318  --  322   Basic Metabolic Panel: Recent Labs  Lab 01/22/23 1349 01/22/23 1401  NA 137 136  K 3.5 3.5  CL 101 100  CO2 24  --   GLUCOSE 175* 186*  BUN 20 22  CREATININE 1.18 1.20  CALCIUM 9.9  --   MG 1.8  --    Liver Function Tests: Recent Labs  Lab 01/22/23 1349  AST 18  ALT 17  ALKPHOS 54  BILITOT 0.6  PROT 6.3*  ALBUMIN 3.5   CBG: Recent Labs  Lab 01/23/23 1135 01/23/23 1652 01/23/23 2120 01/24/23 0914 01/24/23 1142  GLUCAP 226* 273* 267* 295* 241*    Discharge time spent: greater than 30 minutes.  Signed: Debarah Crape, DO [facility] 01/24/2023

## 2023-01-24 NOTE — Hospital Course (Signed)
Mr. Allocco is a pleasant 88 year old male with CAD, hyperlipidemia, diverticulosis, DJD, diabetes, gastritis, history of kidney stones with pyelonephritis, hypertension, hypertrophy of prostate with urinary obstruction, history of melanoma presented to the ED on 12/12 after tremulous episode and dizziness.  Patient experienced tremors at home and had concern for seizure so his son brought him to the ED.  EMS witnessed a seizure-like event, but patient maintained consciousness throughout the event.  No postictal period.  He did not have any loss of bowel or bladder.  Workup was mostly negative.  Head CT unremarkable, CBC and CMP within normal limits.  UA was consistent with urine infection so patient was initiated on Rocephin.  Neurology was consulted.  Patient had an EEG on 12/13 during which time one of his tremulous events occurred.  There was no evidence of seizure or epileptic discharge on this EEG.  I also witnessed an episode after the EEG.  During his episode he was redirectable, maintained consciousness, had no loss of bowel or bladder, and was able to communicate with me throughout.  With deep breaths patient was able to control his movements.  Immediately after his tremors resolved, he began to get tearful discussing his wife who has passed away and also his daughter.  His son was at bedside and reports that he feels his father has become increasingly anxious and depressed as of late.  By reevaluation on 12/14 patient is complaining of some chronic low back pain, but otherwise appears at baseline.  Vitals and labs remain normal. I had extensive discussion with the patient's son, Casimiro Needle, regarding this diagnosis of psychogenic nonepileptic seizure.  He endorses understanding.  The patient has been initiated on Lexapro which will need to be titrated by his primary care physician outpatient. He is also being sent home with an additional 5 days of antibiotics to cover his urinary tract infection.  Urine  cultures at this time are growing gram-negative rods, will follow ID with susceptibilities and follow-up if alternative medication needs to be prescribed.

## 2023-01-24 NOTE — Progress Notes (Addendum)
Patient had episodes of tremors while sitting at the edge of the bed. Pt alert and oriented verbalized dizziness and pain on lower back, son is at bedside. Pt did not loss consciousness. Pt instructed to lie back down  and tremors stopped, assisted back to bed.  Stated that when he feels pain on his lower  back ,tremors starts and then feels dizzy. No further complaints at this time.

## 2023-01-24 NOTE — Plan of Care (Signed)
  Problem: Coping: Goal: Ability to adjust to condition or change in health will improve Outcome: Progressing   Problem: Fluid Volume: Goal: Ability to maintain a balanced intake and output will improve Outcome: Progressing

## 2023-01-24 NOTE — Care Management Obs Status (Signed)
MEDICARE OBSERVATION STATUS NOTIFICATION   Patient Details  Name: [name] MRN: [id number] Date of Birth: 1934/09/21   Medicare Observation Status Notification Given:  Yes    Lawerance Sabal, RN 01/24/2023, 7:53 AM

## 2023-01-25 LAB — URINE CULTURE: Culture: 70000 — AB

## 2023-01-30 ENCOUNTER — Encounter: Payer: Self-pay | Admitting: Family Medicine

## 2023-01-30 ENCOUNTER — Ambulatory Visit (INDEPENDENT_AMBULATORY_CARE_PROVIDER_SITE_OTHER): Payer: Medicare Other | Admitting: Family Medicine

## 2023-01-30 VITALS — BP 142/64 | HR 88 | Temp 97.8°F | Ht 71.0 in | Wt 222.8 lb

## 2023-01-30 DIAGNOSIS — N390 Urinary tract infection, site not specified: Secondary | ICD-10-CM | POA: Diagnosis not present

## 2023-01-30 DIAGNOSIS — F445 Conversion disorder with seizures or convulsions: Secondary | ICD-10-CM | POA: Diagnosis not present

## 2023-01-30 DIAGNOSIS — R319 Hematuria, unspecified: Secondary | ICD-10-CM | POA: Diagnosis not present

## 2023-01-30 DIAGNOSIS — E1159 Type 2 diabetes mellitus with other circulatory complications: Secondary | ICD-10-CM | POA: Diagnosis not present

## 2023-01-30 MED ORDER — ESCITALOPRAM OXALATE 10 MG PO TABS: 10.0000 mg | ORAL_TABLET | Freq: Every day | ORAL | 3 refills | Status: DC

## 2023-01-30 NOTE — Progress Notes (Unsigned)
Felt like he had lower sugar at OV, improved with snack.  Hadn't had lunch and only had minimal breakfast.  Routine cautions given to patient.  Sugar improved since coming home from hospital, 149 this AM.    Discussed events prior to admission and then inpatient course.  EMS witnessed a seizure-like event, but patient maintained consciousness throughout the event.  No postictal period.  He did not have any loss of bowel or bladder.   Treated for presumed UTI as inpatient. No fevers, no burning with urination now. Done with cefdinir.    Patient had an EEG on 12/13 during which time one of his tremulous events occurred.  There was no evidence of seizure or epileptic discharge on this EEG.   Discussed diagnosis of psychogenic nonepileptic seizure.  Stressors discussed with patient.  Has been started on Lexapro in the meantime without adverse effect on medication.  No other events in the meantime, since discharge.  Meds, vitals, and allergies reviewed.   ROS: Per HPI unless specifically indicated in ROS section   GEN: nad, alert and oriented HEENT: mucous membranes moist NECK: supple w/o LA CV: rrr. PULM: ctab, no inc wob ABD: soft, +bs EXT: no edema SKIN: Well-perfused.  35 minutes were devoted to patient care in this encounter (this includes time spent reviewing the patient's file/history, interviewing and examining the patient, counseling/reviewing plan with patient).

## 2023-01-30 NOTE — Patient Instructions (Addendum)
Should be done with cefdinir - antibiotic.   If any burning with urination, then let me know.   Keep taking lexapro and update me about your anxiety/mood in about 2-3 weeks.  Please call with an update.   Plan on recheck in about 3 months with A1c at the visit.   Take care.  Glad to see you./

## 2023-02-01 DIAGNOSIS — F445 Conversion disorder with seizures or convulsions: Secondary | ICD-10-CM | POA: Insufficient documentation

## 2023-02-01 NOTE — Assessment & Plan Note (Signed)
Hypoglycemia cautions discussed with patient.  Symptoms related likely to relative hypoglycemia at office visit due to prolonged fasting.  See after visit summary.  Recheck periodically.  Continue glimepiride and insulin for now but he can update me if he has any more low sugars.

## 2023-02-01 NOTE — Assessment & Plan Note (Signed)
No symptoms now, update me if any dysuria.

## 2023-02-01 NOTE — Assessment & Plan Note (Signed)
Likely complicated by UTI/stressors. Keep taking lexapro and update me about anxiety/mood in about 2-3 weeks.  Please call with an update.  Patient and son agree.  Okay for outpatient follow-up.

## 2023-02-06 ENCOUNTER — Ambulatory Visit: Payer: Self-pay | Admitting: Family Medicine

## 2023-02-06 NOTE — Telephone Encounter (Signed)
Lvm asking pt to call back.  Need to relay Dr. Josefine Class message.

## 2023-02-06 NOTE — Telephone Encounter (Signed)
I spoke with [name] (DPR signed) [name] said he just found out that for 2 - 3 days pt had been vomiting; not sure how many times or how often pt vomited. Pt has dizziness on and off when stands up; pt  recently started taking Lexapro and [name] wondered if could be side effects of that med. 02/06/23 pt is no longer dizzy and no vomiting today. Pt is able to eat and drink normally. [name] is encouraging pt to drink liquids so will not get dehydrated.Pt  also having constipation. Michael gave pt miralax and that relieved the constipation. Pt is  not running fever but [name] is not aware of BP readings and no abd or Chest pain. [name] said that pt said today he is feeling better. I offered to schedule appt for pt on 02/09/23 with a different provider and [name] said pt only wants to see Dr Para March. [name] said he would wait and see how pt felt Mon and would cb with update and schedule appt if needed. UC & ED precautions given and [name] voiced  understanding and [name] said if pt condition worsened then [name] would take pt to [facility] or ED. Sending note to Dr Para March who is out of office and Dr Reece Agar who is in office.

## 2023-02-06 NOTE — Telephone Encounter (Signed)
Given the timeline, I wouldn't suspect this was med related/due to lexapro.  If he improved while still on med, then likely not med related.  There have been mult patients in the community with GI illness recently, and this could have been a transient issue that self resolved.  Update [place] as needed, seek eval if worse, and let me know if persistently lightheaded.  Thanks.

## 2023-02-06 NOTE — Telephone Encounter (Addendum)
Copied from CRM [id number]. Topic: Clinical - Red Word Triage >> Feb 06, 2023 10:40 AM Corin V wrote: Red Word that prompted transfer to Nurse Triage: Patient started Lexapro a few days ago and patient has been throwing up for 2-3 days. Patient son, Casimiro Needle, is unsure if he needs to stop medication.   Chief Complaint: Nausea/Vomiting Symptoms: Nausea/vomiting, dizziness Frequency: 2-3 days  Additional Notes:   Patient's son Casimiro Needle stated that patient has been having nausea and vomiting for the past 2-3 days. He is not sure of how many times patient has vomited. Casimiro Needle stated that his girlfriend told him this information. Patient also has been experiencing occasional dizziness when standing, but he is still able to walk okay. Casimiro Needle believes that these symptoms are caused by Lexapro which patient recently started taking a couple weeks ago. He wants to know if patient should continue taking this medication. I asked if I could speak to patient directly. Casimiro Needle stated that he will speak to patient and I will be able to hear patient's response. Casimiro Needle asked the patient if he has been sick/vomiting and this RN was able to hear the patient deny these symptoms.   Unable to fully complete assessment due to patient denying having symptoms. Casimiro Needle stated that the patient wont admit having any symptoms because he is stubborn, but he might be comfortable with telling Dr. Para March about it personally. Please advise.   Answer Assessment - Initial Assessment Questions 1. NAUSEA SEVERITY: "How bad is the nausea?" (e.g., mild, moderate, severe; dehydration, weight loss)   - MILD: loss of appetite without change in eating habits   - MODERATE: decreased oral intake without significant weight loss, dehydration, or malnutrition   - SEVERE: inadequate caloric or fluid intake, significant weight loss, symptoms of dehydration     Patient has been vomiting for 2-3 days.  2. ONSET: "When did the nausea begin?"     2  or 3 days ago  3. VOMITING: "Any vomiting?" If Yes, ask: "How many times today?"     Unknown  4. CAUSE: "What do you think is causing the nausea?"     Medication, patient recently started Lexapro  Protocols used: Nausea-A-AH

## 2023-02-09 NOTE — Telephone Encounter (Signed)
Left message to return call to our office.  

## 2023-02-10 NOTE — Telephone Encounter (Signed)
Called patient left message to call office. He does not have my chart. Will send letter to have him call office.

## 2023-02-10 NOTE — Telephone Encounter (Signed)
 Noted. Thanks.

## 2023-03-06 ENCOUNTER — Emergency Department (HOSPITAL_COMMUNITY): Payer: Medicare Other

## 2023-03-06 ENCOUNTER — Encounter (HOSPITAL_COMMUNITY): Payer: Self-pay | Admitting: Emergency Medicine

## 2023-03-06 ENCOUNTER — Other Ambulatory Visit: Payer: Self-pay | Admitting: Family Medicine

## 2023-03-06 ENCOUNTER — Emergency Department (HOSPITAL_COMMUNITY)
Admission: EM | Admit: 2023-03-06 | Discharge: 2023-03-06 | Disposition: A | Discharge status: Home / Self Care | Payer: Medicare Other | Attending: Emergency Medicine | Admitting: Emergency Medicine

## 2023-03-06 ENCOUNTER — Other Ambulatory Visit: Payer: Self-pay

## 2023-03-06 DIAGNOSIS — Z79899 Other long term (current) drug therapy: Secondary | ICD-10-CM | POA: Diagnosis not present

## 2023-03-06 DIAGNOSIS — Z794 Long term (current) use of insulin: Secondary | ICD-10-CM | POA: Diagnosis not present

## 2023-03-06 DIAGNOSIS — Z7982 Long term (current) use of aspirin: Secondary | ICD-10-CM | POA: Insufficient documentation

## 2023-03-06 DIAGNOSIS — R339 Retention of urine, unspecified: Secondary | ICD-10-CM

## 2023-03-06 DIAGNOSIS — R42 Dizziness and giddiness: Secondary | ICD-10-CM

## 2023-03-06 DIAGNOSIS — R030 Elevated blood-pressure reading, without diagnosis of hypertension: Secondary | ICD-10-CM | POA: Diagnosis present

## 2023-03-06 DIAGNOSIS — I10 Essential (primary) hypertension: Secondary | ICD-10-CM | POA: Diagnosis not present

## 2023-03-06 DIAGNOSIS — R739 Hyperglycemia, unspecified: Secondary | ICD-10-CM | POA: Insufficient documentation

## 2023-03-06 DIAGNOSIS — R000 Tachycardia, unspecified: Secondary | ICD-10-CM | POA: Diagnosis not present

## 2023-03-06 DIAGNOSIS — Z7984 Long term (current) use of oral hypoglycemic drugs: Secondary | ICD-10-CM | POA: Insufficient documentation

## 2023-03-06 DIAGNOSIS — G319 Degenerative disease of nervous system, unspecified: Secondary | ICD-10-CM | POA: Diagnosis not present

## 2023-03-06 DIAGNOSIS — I4891 Unspecified atrial fibrillation: Secondary | ICD-10-CM | POA: Diagnosis not present

## 2023-03-06 DIAGNOSIS — E1165 Type 2 diabetes mellitus with hyperglycemia: Secondary | ICD-10-CM | POA: Diagnosis not present

## 2023-03-06 LAB — COMPREHENSIVE METABOLIC PANEL
ALT: 17 U/L (ref 0–44)
AST: 18 U/L (ref 15–41)
Albumin: 3.6 g/dL (ref 3.5–5.0)
Alkaline Phosphatase: 70 U/L (ref 38–126)
Anion gap: 14 (ref 5–15)
BUN: 16 mg/dL (ref 8–23)
CO2: 23 mmol/L (ref 22–32)
Calcium: 9.7 mg/dL (ref 8.9–10.3)
Chloride: 99 mmol/L (ref 98–111)
Creatinine, Ser: 1.22 mg/dL (ref 0.61–1.24)
GFR, Estimated: 58 mL/min — ABNORMAL LOW (ref >60)
Glucose, Bld: 274 mg/dL — ABNORMAL HIGH (ref 70–99)
Potassium: 3.7 mmol/L (ref 3.5–5.1)
Sodium: 136 mmol/L (ref 135–145)
Total Bilirubin: 0.5 mg/dL (ref 0.0–1.2)
Total Protein: 6.6 g/dL (ref 6.5–8.1)

## 2023-03-06 LAB — CBC
HCT: 42.5 % (ref 39.0–52.0)
Hemoglobin: 14.2 g/dL (ref 13.0–17.0)
MCH: 30.9 pg (ref 26.0–34.0)
MCHC: 33.4 g/dL (ref 30.0–36.0)
MCV: 92.6 fL (ref 80.0–100.0)
Platelets: 333 10*3/uL (ref 150–400)
RBC: 4.59 MIL/uL (ref 4.22–5.81)
RDW: 13.4 % (ref 11.5–15.5)
WBC: 8.9 10*3/uL (ref 4.0–10.5)
nRBC: 0 % (ref 0.0–0.2)

## 2023-03-06 LAB — URINALYSIS, ROUTINE W REFLEX MICROSCOPIC
Bilirubin Urine: NEGATIVE
Glucose, UA: >=500 mg/dL — AB
Hgb urine dipstick: NEGATIVE
Ketones, ur: NEGATIVE mg/dL
Nitrite: NEGATIVE
Protein, ur: 100 mg/dL — AB
Specific Gravity, Urine: 1.007 (ref 1.005–1.030)
pH: 7 (ref 5.0–8.0)

## 2023-03-06 LAB — CBG MONITORING, ED: Glucose-Capillary: 252 mg/dL — ABNORMAL HIGH (ref 70–99)

## 2023-03-06 MED ORDER — MECLIZINE HCL 25 MG PO TABS
12.5000 mg | ORAL_TABLET | Freq: Once | ORAL | Status: AC
Start: 2023-03-06 — End: 2023-03-06
  Administered 2023-03-06: 12.5 mg via ORAL
  Filled 2023-03-06: qty 1

## 2023-03-06 MED ORDER — TAMSULOSIN HCL 0.4 MG PO CAPS
0.4000 mg | ORAL_CAPSULE | Freq: Once | ORAL | Status: AC
  Administered 2023-03-06: 0.4 mg via ORAL
  Filled 2023-03-06: qty 1

## 2023-03-06 MED ORDER — MECLIZINE HCL 25 MG PO TABS
12.5000 mg | ORAL_TABLET | Freq: Once | ORAL | Status: AC
  Administered 2023-03-06: 12.5 mg via ORAL
  Filled 2023-03-06: qty 1

## 2023-03-06 MED ORDER — MECLIZINE HCL 12.5 MG PO TABS
12.5000 mg | ORAL_TABLET | Freq: Three times a day (TID) | ORAL | 0 refills | Status: DC | PRN
Start: 2023-03-06 — End: 2023-03-17

## 2023-03-06 MED ORDER — LORAZEPAM 2 MG/ML IJ SOLN
1.0000 mg | Freq: Once | INTRAMUSCULAR | Status: AC
  Administered 2023-03-06: 1 mg via INTRAVENOUS
  Filled 2023-03-06: qty 1

## 2023-03-06 MED ORDER — LACTATED RINGERS IV BOLUS
1000.0000 mL | Freq: Once | INTRAVENOUS | Status: AC
  Administered 2023-03-06: 1000 mL via INTRAVENOUS

## 2023-03-06 MED ORDER — ONDANSETRON HCL 4 MG PO TABS
4.0000 mg | ORAL_TABLET | Freq: Four times a day (QID) | ORAL | 0 refills | Status: DC
Start: 2023-03-06 — End: 2024-01-04

## 2023-03-06 NOTE — Telephone Encounter (Signed)
Copied from CRM [id number]. Topic: Clinical - Medication Refill >> Mar 06, 2023  2:07 PM Fonda Kinder J wrote: Most Recent Primary Care Visit:  Provider: Joaquim Nam  Department: [facility]  Visit Type: HOSPITAL FU  Date: 01/30/2023  Medication: escitalopram (LEXAPRO) 10 MG tablet  Has the patient contacted their pharmacy? Yes (Agent: If no, request that the patient contact the pharmacy for the refill. If patient does not wish to contact the pharmacy document the reason why and proceed with request.) (Agent: If yes, when and what did the pharmacy advise?) Contact PCP  Is this the correct pharmacy for this prescription? Yes If no, delete pharmacy and type the correct one.  This is the patient's preferred pharmacy:  [facility] - [place], [place] - [address] [place] [place] [address] Phone: [phone] Fax: [phone]     Has the prescription been filled recently? No  Is the patient out of the medication? Yes  Has the patient been seen for an appointment in the last year OR does the patient have an upcoming appointment? Yes  Can we respond through MyChart? No  Agent: Please be advised that Rx refills may take up to 3 business days. We ask that you follow-up with your pharmacy.

## 2023-03-06 NOTE — ED Notes (Signed)
16 fr catheter placed, but urine out not observed. Melony Overly, MD mad aware and told RN to place 38fr catheter instead.

## 2023-03-06 NOTE — ED Provider Notes (Signed)
Patient signed out to me at 1530 by Dr. Denton Lank pending MRI.  In short this is an 89 year old male with a past medical history of diabetes, CAD, hypertension that presented to the emergency department with dizziness.  He was initially evaluated by Dr. Denton Lank and was found to be in sinus tachycardia with PVCs and PACs.  Lab workup showed mild hyperglycemia and otherwise no acute abnormalities.  He has been given fluids and meclizine and Ativan for symptomatic management and is pending MRI to evaluate for possible central vertigo as a cause of his dizziness.  At my time of signout he is in MRI.  Clinical Course as of 03/06/23 1929  Fri Mar 06, 2023  1744 Abnormal signal in R V2 segment, otherwise no acute abnormality on MRI.  [VK]  1816 Upon reevaluation, the patient reports that his dizziness is worsening again.  He is able to stand and urinate without any ataxia.  He had some mild right sided gaze nystagmus but no other neurologic deficits on exam.  The patient is also requesting his home Flomax.  Urine was normal here.  Patient will be given additional dose of meclizine and he is otherwise stable for discharge home and recommended outpatient follow-up.  Of note respiratory rate of 37 was recorded but he is not tachypneic at all on my exam.  He is mildly tachycardic as he is standing and trying to PE and his heart rate normalizes upon sitting and resting in bed. [VK]  1928 I was notified by RN that patient was unable to urinate. Bladder scan showed >500 cc urine. Foley catheter will be placed. Patient states he has an outpatient urologist to follow up with. [VK]    Clinical Course User Index [VK] Rexford Maus, DO      Rexford Maus, [place] 03/06/23 1819

## 2023-03-06 NOTE — Discharge Instructions (Addendum)
You were seen in the emergency department for your dizziness.  This is likely due to vertigo which is that room spinning sensation.  You had no signs of stroke on your MRI.  I have given you prescription of meclizine that you can take as needed for dizziness and Zofran as needed for your nausea.  You can follow-up with the ear nose and throat doctor for further management of your vertigo symptoms.  You should return to the emergency department if you are having worsening dizziness and you cannot walk, you have associated numbness or weakness in your arms or legs, you pass out or if you have any other new or concerning symptoms.

## 2023-03-06 NOTE — ED Notes (Addendum)
PT c/o of not being able to void. PT bladder scanned at .

## 2023-03-06 NOTE — ED Notes (Signed)
Catheter placed. Bladder rescanned and pt showing 56 cc from 538cc in bladder. Urine draining

## 2023-03-06 NOTE — ED Notes (Signed)
Theresia Lo, DO gave confirmation to discharge pt

## 2023-03-06 NOTE — ED Provider Notes (Signed)
[place] EMERGENCY DEPARTMENT AT [facility] Provider Note   CSN: [id number] Arrival date & time: 03/06/23  1302     History  Chief Complaint  Patient presents with   Dizziness    [name] is a 89 y.o. male.  Pt co dizziness acute onset today. Indicates noticed more after was on commode today. States when tried to walk subsequently felt unsteady. Describes dizziness as both a room spinning/unsteady feeling, as well as lightheadedness. No syncope. No trauma/fall. Denies ear pain, tinnitus or hearing loss. Denies cough, sore throat, sinus congestion or uri symptoms.  No headache. No change in vision or speech. No numbness/weakness. Denies hx vertigo. Denies blood loss, rectal bleeding or melena. No recent change in meds. No fever or chills. No gu c/o.   The history is provided by the patient, medical records and the EMS personnel.       Home Medications Prior to Admission medications   Medication Sig Start Date End Date Taking? Authorizing Provider  Accu-Chek Softclix Lancets lancets TEST BLOOD SUGAR UP TO THREE TIMES DAILY 01/23/20   Joaquim Nam, MD  amLODipine (NORVASC) 10 MG tablet TAKE 1 TABLET BY MOUTH IN THE  MORNING 06/23/22   Joaquim Nam, MD  aspirin EC 81 MG tablet Take 81 mg by mouth every evening.    [provider]  Blood Glucose Calibration (ACCU-CHEK AVIVA) SOLN Use as directed monthly. E11.59  Insulin dependent 12/06/18   Joaquim Nam, MD  Blood Glucose Monitoring Suppl (ACCU-CHEK AVIVA PLUS) w/Device KIT Check sugar up to 3 times daily. E11.59  Insulin dependent 11/30/18   Joaquim Nam, MD  carvedilol (COREG) 12.5 MG tablet TAKE 1 TABLET BY MOUTH TWICE  DAILY WITH MEALS 06/23/22   Joaquim Nam, MD  COD LIVER OIL PO Take 1 capsule by mouth 2 (two) times daily.     [provider]  Continuous Glucose Receiver (FREESTYLE LIBRE 3 READER) DEVI 1 each by Does not apply route as directed. 05/28/22   Joaquim Nam, MD   Continuous Glucose Sensor (FREESTYLE LIBRE 3 SENSOR) MISC Place 1 sensor on the skin every 14 days. Use to check glucose continuously 05/28/22   Joaquim Nam, MD  cyanocobalamin (,VITAMIN B-12,) 1000 MCG/ML injection Inject 1 mL (1,000 mcg total) into the muscle every 30 (thirty) days. Dispense 3 of the 1 mL vials. 10/29/18   Joaquim Nam, MD  diphenhydrAMINE HCl (BENADRYL ALLERGY PO) Take 1 each by mouth at bedtime. Liquid Gels    [provider]  DROPLET PEN NEEDLES 30G X 8 MM MISC USE  WITH  INSULIN  PEN 02/03/19   Joaquim Nam, MD  escitalopram (LEXAPRO) 10 MG tablet Take 1 tablet (10 mg total) by mouth daily. 01/30/23 01/30/24  Joaquim Nam, MD  glimepiride (AMARYL) 4 MG tablet Take 0.5 tablets by mouth daily with breakfast.    [provider]  glucose blood (ONETOUCH ULTRA) test strip USE TO CHECK UP TO 3 TIMES DAILY AS NEEDED 09/01/22   Joaquim Nam, MD  hydrALAZINE (APRESOLINE) 50 MG tablet TAKE 1 TABLET BY MOUTH 3 TIMES  DAILY 06/23/22   Joaquim Nam, MD  insulin glargine, 1 Unit Dial, ([name] SOLOSTAR) 300 UNIT/ML Solostar Pen INJECT SUBCUTANEOUSLY 16  UNITS AT BEDTIME 01/02/23   Joaquim Nam, MD  insulin lispro (HUMALOG KWIKPEN) 100 UNIT/ML KwikPen INJECT SUBCUTANEOUSLY 4-10 UNITS 3 TIMES DAILY BEFORE MEALS PER  SLIDING SCALE: 151-200 = 4  UN,  201-250 = 6 UN, 251-300 = 8 UN,  AND OVER 300 = 10 UN 01/02/23   Joaquim Nam, MD  lisinopril-hydrochlorothiazide (ZESTORETIC) 20-25 MG tablet TAKE 1 TABLET BY MOUTH IN THE  MORNING 06/23/22   Joaquim Nam, MD  meclizine (ANTIVERT) 12.5 MG tablet Take 1 tablet (12.5 mg total) by mouth 3 (three) times daily as needed for dizziness. 01/02/23   Nira Conn, MD  naproxen sodium (ALEVE) 220 MG tablet Take 220 mg by mouth at bedtime.    [provider]  potassium chloride SA (KLOR-CON M) 20 MEQ tablet TAKE 1 TABLET BY MOUTH DAILY 06/23/22   Joaquim Nam, MD  SYRINGE-NEEDLE, DISP, 3 ML  (B-D 3CC LUER-LOK SYR 25GX1") 25G X 1" 3 ML MISC USE TO INJECT VITAMIN B12 MONTHLY. 08/22/19   Joaquim Nam, MD  tamsulosin (FLOMAX) 0.4 MG CAPS capsule TAKE 1 CAPSULE BY MOUTH DAILY  AFTER SUPPER 12/05/22   Joaquim Nam, MD  traMADol (ULTRAM) 50 MG tablet TAKE 1 TABLET BY MOUTH EVERY 12 HOURS AS NEEDED FOR KNEE PAIN. 11/19/22   Joaquim Nam, MD      Allergies    Morphine, Atorvastatin, Codeine phosphate, and Metformin and related    Review of Systems   Review of Systems  Constitutional:  Negative for chills and fever.  HENT:  Negative for ear pain, hearing loss, sore throat, tinnitus and trouble swallowing.   Eyes:  Negative for visual disturbance.  Respiratory:  Negative for cough and shortness of breath.   Cardiovascular:  Negative for chest pain, palpitations and leg swelling.  Gastrointestinal:  Negative for abdominal pain, blood in stool, diarrhea and vomiting.  Genitourinary:  Negative for dysuria and flank pain.  Musculoskeletal:  Negative for back pain and neck pain.  Skin:  Negative for rash.  Neurological:  Positive for dizziness and light-headedness. Negative for speech difficulty, weakness, numbness and headaches.  Psychiatric/Behavioral:  Negative for confusion.     Physical Exam Updated Vital Signs BP (!) 170/90   Pulse 95   Temp 98.3 F (36.8 C) (Oral)   Resp 19   SpO2 95%  Physical Exam Vitals and nursing note reviewed.  Constitutional:      Appearance: Normal appearance. He is well-developed.  HENT:     Head: Atraumatic.     Right Ear: Tympanic membrane normal.     Left Ear: Tympanic membrane normal.     Nose: Nose normal.     Mouth/Throat:     Mouth: Mucous membranes are moist.     Pharynx: Oropharynx is clear.  Eyes:     General: No scleral icterus.    Extraocular Movements: Extraocular movements intact.     Conjunctiva/sclera: Conjunctivae normal.     Pupils: Pupils are equal, round, and reactive to light.  Neck:     Vascular: No carotid  bruit.     Trachea: No tracheal deviation.  Cardiovascular:     Rate and Rhythm: Normal rate and regular rhythm.     Pulses: Normal pulses.     Heart sounds: Normal heart sounds. No murmur heard.    No friction rub. No gallop.  Pulmonary:     Effort: Pulmonary effort is normal. No accessory muscle usage or respiratory distress.     Breath sounds: Normal breath sounds.  Abdominal:     General: Bowel sounds are normal. There is no distension.     Palpations: Abdomen is soft.     Tenderness: There is  no abdominal tenderness.  Genitourinary:    Comments: No cva tenderness. Musculoskeletal:        General: No swelling or tenderness.     Cervical back: Normal range of motion and neck supple. No rigidity.     Right lower leg: No edema.     Left lower leg: No edema.  Skin:    General: Skin is warm and dry.     Findings: No rash.  Neurological:     Mental Status: He is alert.     Comments: Alert, speech clear. No dysarthria or aphasia. Motor/sens grossly intact bil. Stre 5/5. No pronator drift. Sensation grossly intact. No nystagmus. +unsteady gait.   Psychiatric:        Mood and Affect: Mood normal.     ED Results / Procedures / Treatments   Labs (all labs ordered are listed, but only abnormal results are displayed) Results for orders placed or performed during the hospital encounter of 03/06/23  CBG monitoring, ED   Collection Time: 03/06/23  1:23 PM  Result Value Ref Range   Glucose-Capillary 252 (H) 70 - 99 mg/dL  CBC   Collection Time: 03/06/23  1:31 PM  Result Value Ref Range   WBC 8.9 4.0 - 10.5 K/uL   RBC 4.59 4.22 - 5.81 MIL/uL   Hemoglobin 14.2 13.0 - 17.0 g/dL   HCT 40.9 81.1 - 91.4 %   MCV 92.6 80.0 - 100.0 fL   MCH 30.9 26.0 - 34.0 pg   MCHC 33.4 30.0 - 36.0 g/dL   RDW 78.2 95.6 - 21.3 %   Platelets 333 150 - 400 K/uL   nRBC 0.0 0.0 - 0.2 %  Comprehensive metabolic panel   Collection Time: 03/06/23  1:31 PM  Result Value Ref Range   Sodium 136 135 - 145  mmol/L   Potassium 3.7 3.5 - 5.1 mmol/L   Chloride 99 98 - 111 mmol/L   CO2 23 22 - 32 mmol/L   Glucose, Bld 274 (H) 70 - 99 mg/dL   BUN 16 8 - 23 mg/dL   Creatinine, Ser 0.86 0.61 - 1.24 mg/dL   Calcium 9.7 8.9 - 57.8 mg/dL   Total Protein 6.6 6.5 - 8.1 g/dL   Albumin 3.6 3.5 - 5.0 g/dL   AST 18 15 - 41 U/L   ALT 17 0 - 44 U/L   Alkaline Phosphatase 70 38 - 126 U/L   Total Bilirubin 0.5 0.0 - 1.2 mg/dL   GFR, Estimated 58 (L) >60 mL/min   Anion gap 14 5 - 15  Urinalysis, Routine w reflex microscopic -Urine, Clean Catch   Collection Time: 03/06/23  1:31 PM  Result Value Ref Range   Color, Urine STRAW (A) YELLOW   APPearance CLEAR CLEAR   Specific Gravity, Urine 1.007 1.005 - 1.030   pH 7.0 5.0 - 8.0   Glucose, UA >=500 (A) NEGATIVE mg/dL   Hgb urine dipstick NEGATIVE NEGATIVE   Bilirubin Urine NEGATIVE NEGATIVE   Ketones, ur NEGATIVE NEGATIVE mg/dL   Protein, ur 469 (A) NEGATIVE mg/dL   Nitrite NEGATIVE NEGATIVE   Leukocytes,Ua TRACE (A) NEGATIVE   RBC / HPF 0-5 0 - 5 RBC/hpf   WBC, UA 6-10 0 - 5 WBC/hpf   Bacteria, UA FEW (A) NONE SEEN   Squamous Epithelial / HPF 0-5 0 - 5 /HPF      EKG EKG Interpretation Date/Time:  Friday March 06 2023 13:11:57 EST Ventricular Rate:  105 PR Interval:  180 QRS Duration:  111 QT Interval:  375 QTC Calculation: 446 R Axis:   31  Text Interpretation: Sinus rhythm Premature ventricular complexes Premature atrial complexes Baseline wander Confirmed by Cathren Laine ([id number]) on 03/06/2023 1:15:58 PM  Radiology No results found.  Procedures Procedures    Medications Ordered in ED Medications  meclizine (ANTIVERT) tablet 12.5 mg (12.5 mg Oral Given 03/06/23 1342)    ED Course/ Medical Decision Making/ A&P                                 Medical Decision Making Problems Addressed: Dizziness: acute illness or injury with systemic symptoms that poses a threat to life or bodily functions Elevated blood pressure reading: acute  illness or injury Essential hypertension: chronic illness or injury with exacerbation, progression, or side effects of treatment that poses a threat to life or bodily functions Hyperglycemia: acute illness or injury  Amount and/or Complexity of Data Reviewed Independent Historian: EMS    Details: hx External Data Reviewed: notes. Labs: ordered. Decision-making details documented in ED Course. Radiology: ordered and independent interpretation performed. Decision-making details documented in ED Course. ECG/medicine tests: ordered and independent interpretation performed. Decision-making details documented in ED Course.  Risk Prescription drug management. Decision regarding hospitalization.   Iv ns. Continuous pulse ox and cardiac monitoring. Labs ordered/sent. Imaging ordered.   Differential diagnosis includes peripheral vertigo, central vertigo, near syncope, anemia, uti, etc. Dispo decision including potential need for admission considered - will get labs and imaging and reassess.   Reviewed nursing notes and prior charts for additional history. External reports reviewed. Additional history from: EMS.   Antivert po, po fluids.   Cardiac monitor: sinus rhythm, rate 94.  Labs reviewed/interpreted by me - wbc and hgb normal. Chem unremarkable except glucose mild-mod elevated, hco3 is normal.  Ivf bolus.  Ua w few wbc, 6-10 (pt denies fever, chills, or dysuria).   MRI reviewed/interpreted by me - pnd.   1511 MRI pending. Signed out to Dr Theresia Lo to check MRI, and dispo appropriately.            Final Clinical Impression(s) / ED Diagnoses Final diagnoses:  None    Rx / DC Orders ED Discharge Orders     None         Cathren Laine, MD 03/06/23 1511

## 2023-03-06 NOTE — ED Triage Notes (Addendum)
Pt bib ems from home; pt states he was on commode trying to have BM, became dizzy ; dizziness has persisted for several hours; ems reports afib on monitor, no documented history; pt on carvedilol , not on thinner; pt sob on exertion; 95% RA; denies pain; BP 220/80, HR 80-110 , cbg 344, RR 24; 18 RFA; hx HTN, DM, CAD; denies cough, denies fevers

## 2023-03-09 ENCOUNTER — Telehealth: Payer: Self-pay

## 2023-03-09 ENCOUNTER — Telehealth: Payer: Self-pay | Admitting: Family Medicine

## 2023-03-09 ENCOUNTER — Other Ambulatory Visit: Payer: Self-pay | Admitting: Family Medicine

## 2023-03-09 ENCOUNTER — Telehealth: Payer: Self-pay | Admitting: Otolaryngology

## 2023-03-09 NOTE — Telephone Encounter (Signed)
Copied from CRM [id number]. Topic: Referral - Status >> Mar 09, 2023  4:20 PM [name] wrote: Reason for CRM: Patient's son,[name], calling back to get a follow up on referral status. He states his father is needing to see an Oncologist ASAP. He states his father's catheter is leaking blood & wants Dr. Para March to try to get this sent as soon as possible. Advised pt that the referral request was previously sent over as well. Please give pt's son a call back once this has been completed or to give an update. CB #: [phone].

## 2023-03-09 NOTE — Telephone Encounter (Signed)
Copied from CRM [id number]. Topic: Referral - Request for Referral >> Mar 09, 2023 10:51 AM Drema Balzarine wrote: Did the patient discuss referral with their provider in the last year? No (If No - schedule appointment) (If Yes - send message)  Appointment offered? No, patient currently admitted at [facility] and they need referral asap   Type of order/referral and detailed reason for visit: Patient catheter leaking  Preference of office, provider, location: Dr. Heloise Purpura  If referral order, have you been seen by this specialty before? No (If Yes, this issue or another issue? When? Where?  Can we respond through MyChart? No

## 2023-03-09 NOTE — Telephone Encounter (Signed)
Closing this encounter as duplicate. One has already been sent.

## 2023-03-09 NOTE — Transitions of Care (Post Inpatient/ED Visit) (Unsigned)
   03/09/2023  Name: [name] MRN: [id number] DOB: March 19, 1934  Today's TOC FU Call Status: Today's TOC FU Call Status:: Unsuccessful Call (1st Attempt) Unsuccessful Call (1st Attempt) Date: 03/09/23  Attempted to reach the patient regarding the most recent Inpatient/ED visit.  Follow Up Plan: Additional outreach attempts will be made to reach the patient to complete the Transitions of Care (Post Inpatient/ED visit) call.   Signature Agnes Lawrence, CMA (AAMA)  [facility] [phone]

## 2023-03-09 NOTE — Telephone Encounter (Signed)
Copied from CRM [id number]. Topic: Referral - Status >> Mar 09, 2023  4:20 PM Prudencio Pair wrote: Reason for CRM: Patient's son,Michael, calling back to get a follow up on referral status. He states his father is needing to see an Oncologist ASAP. He states his father's catheter is leaking blood & wants Dr. Para March to try to get this sent as soon as possible. Advised pt that the referral request was previously sent over as well. Please give pt's son a call back once this has been completed or to give an update. CB #: [phone].

## 2023-03-10 DIAGNOSIS — R339 Retention of urine, unspecified: Secondary | ICD-10-CM | POA: Diagnosis not present

## 2023-03-11 NOTE — Addendum Note (Signed)
Addended by: Joaquim Nam on: 03/11/2023 02:40 PM   Modules accepted: Orders

## 2023-03-11 NOTE — Telephone Encounter (Signed)
I put in the urology referral. Please check with pharmacy, he should have #90, 3RF of lexapro starting back in 01/2023.  Thanks.

## 2023-03-11 NOTE — Telephone Encounter (Signed)
I have been out of clinic.  He was seen by urology in the meantime.  My understanding is the appointment needed to be with urology, not oncology.    Please get update on patient.  Thanks.

## 2023-03-11 NOTE — Telephone Encounter (Signed)
Left voicemail for patient to return call to office.

## 2023-03-12 NOTE — Telephone Encounter (Signed)
Left voicemail for patient to return call to office.

## 2023-03-13 ENCOUNTER — Other Ambulatory Visit: Payer: Self-pay | Admitting: Family Medicine

## 2023-03-13 ENCOUNTER — Other Ambulatory Visit: Payer: Self-pay

## 2023-03-13 NOTE — Telephone Encounter (Signed)
Copied from CRM [id number]. Topic: General - Other >> Mar 13, 2023 11:44 AM Almira Coaster wrote: Reason for CRM: Patient's son is calling because they received a call, asked if patient was seen by the urologist and the catheter was removed  and is in stable. Referral is no longer needed.

## 2023-03-13 NOTE — Telephone Encounter (Signed)
Last Fill: 01/30/23  Last OV: 01/30/23 Next OV: 04/13/23  Routing to provider for review/authorization.

## 2023-03-13 NOTE — Telephone Encounter (Unsigned)
Copied from CRM [id number]. Topic: Clinical - Medication Refill >> Mar 13, 2023 11:51 AM Almira Coaster wrote: Most Recent Primary Care Visit:  Provider: Joaquim Nam  Department: [facility]  Visit Type: HOSPITAL FU  Date: 01/30/2023  Medication: escitalopram (LEXAPRO) 10 MG tablet  Has the patient contacted their pharmacy? No (Agent: If no, request that the patient contact the pharmacy for the refill. If patient does not wish to contact the pharmacy document the reason why and proceed with request.) (Agent: If yes, when and what did the pharmacy advise?)  Is this the correct pharmacy for this prescription? Yes If no, delete pharmacy and type the correct one.  This is the patient's preferred pharmacy:  [facility] - [place], [place] - [address] [place] [address] Phone: [phone] Fax: [phone]  [facility]) - [place], [place] - [address] [place] [address] [place] [address] Phone: [phone] Fax: [phone]  [facility] - [place], [place] - [address] [place] [address] Phone: [phone] Fax: [phone]   Has the prescription been filled recently? No  Is the patient out of the medication? Yes  Has the patient been seen for an appointment in the last year OR does the patient have an upcoming appointment? Yes  Can we respond through MyChart? No  Agent: Please be advised that Rx refills may take up to 3 business days. We ask that you follow-up with your pharmacy.

## 2023-03-15 ENCOUNTER — Other Ambulatory Visit: Payer: Self-pay

## 2023-03-15 ENCOUNTER — Emergency Department (HOSPITAL_COMMUNITY)
Admission: EM | Admit: 2023-03-15 | Discharge: 2023-03-16 | Disposition: A | Discharge status: Home / Self Care | Payer: Medicare Other | Attending: Emergency Medicine | Admitting: Emergency Medicine

## 2023-03-15 ENCOUNTER — Emergency Department (HOSPITAL_COMMUNITY): Payer: Medicare Other

## 2023-03-15 DIAGNOSIS — R000 Tachycardia, unspecified: Secondary | ICD-10-CM | POA: Insufficient documentation

## 2023-03-15 DIAGNOSIS — R350 Frequency of micturition: Secondary | ICD-10-CM | POA: Insufficient documentation

## 2023-03-15 DIAGNOSIS — K5900 Constipation, unspecified: Secondary | ICD-10-CM | POA: Diagnosis not present

## 2023-03-15 DIAGNOSIS — R42 Dizziness and giddiness: Secondary | ICD-10-CM | POA: Insufficient documentation

## 2023-03-15 DIAGNOSIS — Z794 Long term (current) use of insulin: Secondary | ICD-10-CM | POA: Insufficient documentation

## 2023-03-15 DIAGNOSIS — Z7984 Long term (current) use of oral hypoglycemic drugs: Secondary | ICD-10-CM | POA: Insufficient documentation

## 2023-03-15 DIAGNOSIS — Z7982 Long term (current) use of aspirin: Secondary | ICD-10-CM | POA: Diagnosis not present

## 2023-03-15 DIAGNOSIS — I6501 Occlusion and stenosis of right vertebral artery: Secondary | ICD-10-CM | POA: Diagnosis not present

## 2023-03-15 DIAGNOSIS — Z20822 Contact with and (suspected) exposure to covid-19: Secondary | ICD-10-CM | POA: Insufficient documentation

## 2023-03-15 DIAGNOSIS — R531 Weakness: Secondary | ICD-10-CM | POA: Diagnosis not present

## 2023-03-15 DIAGNOSIS — R059 Cough, unspecified: Secondary | ICD-10-CM | POA: Diagnosis not present

## 2023-03-15 DIAGNOSIS — I10 Essential (primary) hypertension: Secondary | ICD-10-CM | POA: Diagnosis not present

## 2023-03-15 LAB — COMPREHENSIVE METABOLIC PANEL
ALT: 15 U/L (ref 0–44)
AST: 17 U/L (ref 15–41)
Albumin: 3.5 g/dL (ref 3.5–5.0)
Alkaline Phosphatase: 67 U/L (ref 38–126)
Anion gap: 11 (ref 5–15)
BUN: 21 mg/dL (ref 8–23)
CO2: 26 mmol/L (ref 22–32)
Calcium: 9.7 mg/dL (ref 8.9–10.3)
Chloride: 97 mmol/L — ABNORMAL LOW (ref 98–111)
Creatinine, Ser: 1.37 mg/dL — ABNORMAL HIGH (ref 0.61–1.24)
GFR, Estimated: 50 mL/min — ABNORMAL LOW (ref >60)
Glucose, Bld: 181 mg/dL — ABNORMAL HIGH (ref 70–99)
Potassium: 3.6 mmol/L (ref 3.5–5.1)
Sodium: 134 mmol/L — ABNORMAL LOW (ref 135–145)
Total Bilirubin: 0.9 mg/dL (ref 0.0–1.2)
Total Protein: 6.9 g/dL (ref 6.5–8.1)

## 2023-03-15 LAB — CBC WITH DIFFERENTIAL/PLATELET
Abs Immature Granulocytes: 0.05 10*3/uL (ref 0.00–0.07)
Basophils Absolute: 0.1 10*3/uL (ref 0.0–0.1)
Basophils Relative: 1 %
Eosinophils Absolute: 0.2 10*3/uL (ref 0.0–0.5)
Eosinophils Relative: 1 %
HCT: 41.4 % (ref 39.0–52.0)
Hemoglobin: 13.9 g/dL (ref 13.0–17.0)
Immature Granulocytes: 0 %
Lymphocytes Relative: 17 %
Lymphs Abs: 2 10*3/uL (ref 0.7–4.0)
MCH: 30.5 pg (ref 26.0–34.0)
MCHC: 33.6 g/dL (ref 30.0–36.0)
MCV: 91 fL (ref 80.0–100.0)
Monocytes Absolute: 0.6 10*3/uL (ref 0.1–1.0)
Monocytes Relative: 5 %
Neutro Abs: 9.1 10*3/uL — ABNORMAL HIGH (ref 1.7–7.7)
Neutrophils Relative %: 76 %
Platelets: 419 10*3/uL — ABNORMAL HIGH (ref 150–400)
RBC: 4.55 MIL/uL (ref 4.22–5.81)
RDW: 13.3 % (ref 11.5–15.5)
WBC: 12 10*3/uL — ABNORMAL HIGH (ref 4.0–10.5)
nRBC: 0 % (ref 0.0–0.2)

## 2023-03-15 LAB — URINALYSIS, ROUTINE W REFLEX MICROSCOPIC
Bilirubin Urine: NEGATIVE
Glucose, UA: NEGATIVE mg/dL
Hgb urine dipstick: NEGATIVE
Ketones, ur: NEGATIVE mg/dL
Nitrite: NEGATIVE
Protein, ur: 100 mg/dL — AB
Specific Gravity, Urine: 1.011 (ref 1.005–1.030)
pH: 7 (ref 5.0–8.0)

## 2023-03-15 LAB — RESP PANEL BY RT-PCR (RSV, FLU A&B, COVID)  RVPGX2
Influenza A by PCR: NEGATIVE
Influenza B by PCR: NEGATIVE
Resp Syncytial Virus by PCR: NEGATIVE
SARS Coronavirus 2 by RT PCR: NEGATIVE

## 2023-03-15 LAB — CBG MONITORING, ED: Glucose-Capillary: 173 mg/dL — ABNORMAL HIGH (ref 70–99)

## 2023-03-15 MED ORDER — LORAZEPAM 2 MG/ML IJ SOLN
1.0000 mg | INTRAMUSCULAR | Status: DC | PRN
  Administered 2023-03-15: 1 mg via INTRAVENOUS
  Filled 2023-03-15: qty 1

## 2023-03-15 NOTE — ED Provider Notes (Signed)
[place] EMERGENCY DEPARTMENT AT [facility] Provider Note   CSN: [id number] Arrival date & time: 03/15/23  1201     History  Chief Complaint  Patient presents with   Dizziness   Urinary Frequency   Constipation    [name] is a 89 y.o. male.  HPI Presents with dizziness, urinary frequency.  Patient also has constipation.  Initially the history is obtained by the patient, chart review, but the patient is eventually joined by his son.  History is notable for evaluation about 1 week ago for dizziness.  Patient had evaluation including MRI, was discharged with meclizine.  It seems though in spite of that medication he continues to have dizziness, more profound today, prompting EMS notification. No additional falls. Son eventually notes that the patient also has had constipation attributed to his meclizine, and today was particularly dizzy, constipated, prompting EMS notification. EMS reports the patient was hypertensive, awake, alert, mildly tachycardic in transport.    Home Medications Prior to Admission medications   Medication Sig Start Date End Date Taking? Authorizing Provider  Accu-Chek Softclix Lancets lancets TEST BLOOD SUGAR UP TO THREE TIMES DAILY 01/23/20   Joaquim Nam, MD  amLODipine (NORVASC) 10 MG tablet TAKE 1 TABLET BY MOUTH IN THE  MORNING 03/10/23   Joaquim Nam, MD  aspirin EC 81 MG tablet Take 81 mg by mouth every evening.    [provider]  Blood Glucose Calibration (ACCU-CHEK AVIVA) SOLN Use as directed monthly. E11.59  Insulin dependent 12/06/18   Joaquim Nam, MD  Blood Glucose Monitoring Suppl (ACCU-CHEK AVIVA PLUS) w/Device KIT Check sugar up to 3 times daily. E11.59  Insulin dependent 11/30/18   Joaquim Nam, MD  carvedilol (COREG) 12.5 MG tablet TAKE 1 TABLET BY MOUTH TWICE  DAILY WITH MEALS 03/10/23   Joaquim Nam, MD  COD LIVER OIL PO Take 1 capsule by mouth 2 (two) times daily.     [provider]   Continuous Glucose Receiver (FREESTYLE LIBRE 3 READER) DEVI 1 each by Does not apply route as directed. 05/28/22   Joaquim Nam, MD  Continuous Glucose Sensor (FREESTYLE LIBRE 3 SENSOR) MISC Place 1 sensor on the skin every 14 days. Use to check glucose continuously 05/28/22   Joaquim Nam, MD  cyanocobalamin (,VITAMIN B-12,) 1000 MCG/ML injection Inject 1 mL (1,000 mcg total) into the muscle every 30 (thirty) days. Dispense 3 of the 1 mL vials. 10/29/18   Joaquim Nam, MD  diphenhydrAMINE HCl (BENADRYL ALLERGY PO) Take 1 each by mouth at bedtime. Liquid Gels    [provider]  DROPLET PEN NEEDLES 30G X 8 MM MISC USE  WITH  INSULIN  PEN 02/03/19   Joaquim Nam, MD  escitalopram (LEXAPRO) 10 MG tablet Take 1 tablet (10 mg total) by mouth daily. 01/30/23 01/30/24  Joaquim Nam, MD  glimepiride (AMARYL) 4 MG tablet Take 0.5 tablets by mouth daily with breakfast.    [provider]  glucose blood (ONETOUCH ULTRA) test strip USE TO CHECK UP TO 3 TIMES DAILY AS NEEDED 09/01/22   Joaquim Nam, MD  hydrALAZINE (APRESOLINE) 50 MG tablet TAKE 1 TABLET BY MOUTH 3 TIMES  DAILY 03/10/23   Joaquim Nam, MD  insulin glargine, 1 Unit Dial, ([name] SOLOSTAR) 300 UNIT/ML Solostar Pen INJECT SUBCUTANEOUSLY 16  UNITS AT BEDTIME 01/02/23   Joaquim Nam, MD  insulin lispro (HUMALOG KWIKPEN) 100 UNIT/ML KwikPen INJECT SUBCUTANEOUSLY 4-10  UNITS 3 TIMES DAILY BEFORE MEALS PER  SLIDING SCALE: 151-200 = 4 UN,  201-250 = 6 UN, 251-300 = 8 UN,  AND OVER 300 = 10 UN 01/02/23   Joaquim Nam, MD  lisinopril-hydrochlorothiazide (ZESTORETIC) 20-25 MG tablet TAKE 1 TABLET BY MOUTH IN THE  MORNING 03/10/23   Joaquim Nam, MD  meclizine (ANTIVERT) 12.5 MG tablet Take 1 tablet (12.5 mg total) by mouth 3 (three) times daily as needed for dizziness. 03/06/23   Elayne Snare K, DO  naproxen sodium (ALEVE) 220 MG tablet Take 220 mg by mouth at bedtime.    [provider]   ondansetron (ZOFRAN) 4 MG tablet Take 1 tablet (4 mg total) by mouth every 6 (six) hours. 03/06/23   Elayne Snare K, DO  potassium chloride SA (KLOR-CON M) 20 MEQ tablet TAKE 1 TABLET BY MOUTH DAILY 03/10/23   Joaquim Nam, MD  SYRINGE-NEEDLE, DISP, 3 ML (B-D 3CC LUER-LOK SYR 25GX1") 25G X 1" 3 ML MISC USE TO INJECT VITAMIN B12 MONTHLY. 08/22/19   Joaquim Nam, MD  tamsulosin (FLOMAX) 0.4 MG CAPS capsule TAKE 1 CAPSULE BY MOUTH DAILY  AFTER SUPPER 12/05/22   Joaquim Nam, MD  traMADol (ULTRAM) 50 MG tablet TAKE 1 TABLET BY MOUTH EVERY 12 HOURS AS NEEDED FOR KNEE PAIN. 11/19/22   Joaquim Nam, MD      Allergies    Morphine, Atorvastatin, Codeine phosphate, and Metformin and related    Review of Systems   Review of Systems  Physical Exam Updated Vital Signs BP (!) 167/86   Pulse (!) 111   Temp 97.9 F (36.6 C)   Resp 20   Ht 5\' 11"  (1.803 m)   Wt 101.1 kg   SpO2 90%   BMI 31.09 kg/m  Physical Exam Vitals and nursing note reviewed.  Constitutional:      Appearance: He is well-developed.     Comments: Uncomfortable appearing large adult male standing, unsteadily.  HENT:     Head: Normocephalic and atraumatic.  Eyes:     Conjunctiva/sclera: Conjunctivae normal.  Cardiovascular:     Rate and Rhythm: Regular rhythm. Tachycardia present.  Pulmonary:     Effort: Pulmonary effort is normal. No respiratory distress.     Breath sounds: No stridor.  Abdominal:     General: There is no distension.  Skin:    General: Skin is warm and dry.  Neurological:     Mental Status: He is alert and oriented to person, place, and time.     Comments: Patient perseverating on needing to urinate, speaking clearly, moving all extremities spontaneously and appropriately to command, gait is unsteady.     ED Results / Procedures / Treatments   Labs (all labs ordered are listed, but only abnormal results are displayed) Labs Reviewed  URINALYSIS, ROUTINE W REFLEX MICROSCOPIC -  Abnormal; Notable for the following components:      Result Value   Protein, ur 100 (*)    Leukocytes,Ua TRACE (*)    Bacteria, UA FEW (*)    All other components within normal limits  COMPREHENSIVE METABOLIC PANEL - Abnormal; Notable for the following components:   Sodium 134 (*)    Chloride 97 (*)    Glucose, Bld 181 (*)    Creatinine, Ser 1.37 (*)    GFR, Estimated 50 (*)    All other components within normal limits  CBC WITH DIFFERENTIAL/PLATELET - Abnormal; Notable for the following components:   WBC 12.0 (*)  Platelets 419 (*)    Neutro Abs 9.1 (*)    All other components within normal limits  CBG MONITORING, ED - Abnormal; Notable for the following components:   Glucose-Capillary 173 (*)    All other components within normal limits  RESP PANEL BY RT-PCR (RSV, FLU A&B, COVID)  [id number]    EKG EKG Interpretation Date/Time:  Sunday March 15 2023 12:06:37 EST Ventricular Rate:  96 PR Interval:  180 QRS Duration:  106 QT Interval:  410 QTC Calculation: 517 R Axis:   27  Text Interpretation: Sinus rhythm with Premature atrial complexes with Abberant conduction Possible Anterior infarct , age undetermined Artifact Confirmed by Gerhard Munch [phone]) on 03/15/2023 12:15:14 PM  Radiology MR ANGIO HEAD WO CONTRAST Result Date: 03/15/2023 CLINICAL DATA:  Neuro deficit, acute, stroke suspected. Abnormal appearance of the right V4 segment on recent MRI. Worsening symptoms. EXAM: MRA HEAD WITHOUT CONTRAST TECHNIQUE: Angiographic images of the Circle of Willis were acquired using MRA technique without intravenous contrast. COMPARISON:  Head MRI 03/06/2023.  Head and neck CTA 12/16/2013. FINDINGS: Anterior circulation: The internal carotid arteries are widely patent from skull base to carotid termini. ACAs and MCAs are patent without evidence of a proximal branch occlusion or significant proximal stenosis. No aneurysm is identified. Posterior circulation: The included distal V3 and V4  segments of the left vertebral artery are patent. There is diminished signal in the distal V3 and proximal V4 segments of the right vertebral artery with absent signal distally consistent with occlusion which is new from 2015. Only faint flow related enhancement is visualized in the right PICA. The basilar artery is patent with an apparent small linear filling defect proximally near the vertebrobasilar junction, indeterminate for artifact, a small amount of thrombus, or localized dissection. A normal variant fenestration could also have this appearance, however one was not apparent on the prior CTA. There is a large right posterior communicating artery. Both PCAs are patent without evidence of a significant proximal stenosis. No aneurysm is identified. Anatomic variants: None. IMPRESSION: 1. Occlusion of the distal right vertebral artery, new from 2015. 2. Apparent small filling defect in the proximal basilar artery, indeterminate for artifact, thrombus, localized dissection, or fenestration. 3. No significant anterior circulation stenosis. Electronically Signed   By: Sebastian Ache M.D.   On: 03/15/2023 15:48    Procedures Procedures    Medications Ordered in ED Medications  LORazepam (ATIVAN) injection 1 mg (1 mg Intravenous Given 03/15/23 1406)    ED Course/ Medical Decision Making/ A&P                                 Medical Decision Making Adult male with recent evaluation for dizziness now presents with worsening dizziness in spite of taking his meclizine.  Chart review notable for MRI suggest possible vascular abnormality on recent evaluation, and given consideration of this contributing to his symptoms versus vertigo versus electrolyte abnormalities or infection, patient had MRI angiography ordered, labs ordered, monitoring started. Cardiac initially tachycardic, but this improved with patient was in sinus rhythm, 90s, unremarkable. Pulse ox 90% room air borderline   Amount and/or  Complexity of Data Reviewed Independent Historian:     Details: Son External Data Reviewed: notes.    Details: Recent ED eval notes Labs: ordered. Decision-making details documented in ED Course. Radiology: ordered and independent interpretation performed. Decision-making details documented in ED Course. ECG/medicine tests: ordered and independent interpretation performed. Decision-making  details documented in ED Course.  Risk Prescription drug management. Decision regarding hospitalization. Diagnosis or treatment significantly limited by social determinants of health.   8:04 PM With son at bedside we discussed results thus far.  I discussed his case with our neurology colleagues.  Given the patient's abnormal MRA, recommendation for additional MRI imaging of the head to ensure no new stroke.  Patient's initial labs reassuring, ECG reassuring, no evidence for sustained arrhythmia, infection, bacteremia, sepsis.  Mild leukocytosis only, mild renal dysfunction as well.  On signout the patient is awaiting MRI, anticipated neurology consult.        Final Clinical Impression(s) / ED Diagnoses Final diagnoses:  Dizziness     Gerhard Munch, MD 03/15/23 2049

## 2023-03-15 NOTE — ED Triage Notes (Signed)
BIB [facility] from home with complaints of dizziness, increased urination, constipation, and hypertension for several days.  EMS  182/100 Hr 110 CBG 220 94% RA placed on 3l RR 35

## 2023-03-16 ENCOUNTER — Telehealth: Payer: Self-pay

## 2023-03-16 ENCOUNTER — Other Ambulatory Visit: Payer: Self-pay | Admitting: Family Medicine

## 2023-03-16 ENCOUNTER — Ambulatory Visit (HOSPITAL_COMMUNITY): Admission: RE | Admit: 2023-03-16 | Payer: Medicare Other | Source: Ambulatory Visit

## 2023-03-16 MED ORDER — LORAZEPAM 2 MG/ML IJ SOLN
0.5000 mg | Freq: Once | INTRAMUSCULAR | Status: AC
  Administered 2023-03-16: 0.5 mg via INTRAVENOUS
  Filled 2023-03-16: qty 1

## 2023-03-16 MED ORDER — LORAZEPAM 1 MG PO TABS
1.0000 mg | ORAL_TABLET | Freq: Once | ORAL | Status: AC
  Administered 2023-03-16: 1 mg via ORAL
  Filled 2023-03-16: qty 1

## 2023-03-16 NOTE — Telephone Encounter (Signed)
Copied from CRM [id number]. Topic: Clinical - Medical Advice >> Mar 16, 2023 10:33 AM Orinda Kenner C wrote: Reason for CRM: Lorain Childes, Patient's son Casimiro Needle [phone] Patient left [facility] hospital today and is still having a lot of dizziness, they advised patient o follow up with pcp. They rule out stroke, an artery is clogged up in the neck area. Casimiro Needle wants to discuss with Dr. Para March patient seeing a neurologist, scheduled an appointment for 03/19/23 12 pm.

## 2023-03-16 NOTE — ED Notes (Addendum)
Pt received ativan for MRI, MRI tech informed this RN that they would be returning the patient without completing the scan because the patient would not lay flat. MD Pollina made aware

## 2023-03-16 NOTE — Discharge Instructions (Addendum)
As we discussed, if you develop new, or concerning changes that is important to return here.  Otherwise take all medication as previously prescribed, with the adjustment of taking the meclizine/Antivert 3 times daily for the next 5 days.  Do this instead of taking it as needed.  Be sure to take your aspirin daily.  Below is the MR angiography result from yesterday.  Discussed this with your physician on follow-up this week.  IMPRESSION: 1. Occlusion of the distal right vertebral artery, new from 2015. 2. Apparent small filling defect in the proximal basilar artery, indeterminate for artifact, thrombus, localized dissection, or fenestration. 3. No significant anterior circulation stenosis.

## 2023-03-16 NOTE — Telephone Encounter (Signed)
Last office visit: 12/04/2410/20/24 Next office visit: 03/19/23 Last refill: meclizine (ANTIVERT) 12.5 MG tablet 03/06/23 30 tablets 0 refills

## 2023-03-16 NOTE — Telephone Encounter (Signed)
Noted, we can discuss at the [facility].  Thanks.

## 2023-03-16 NOTE — ED Provider Notes (Signed)
7:55 AM Signout yesterday, as previously documented, with patient awaiting MR after I discussed this case with our neurology colleagues, reviewed the MRA results with vertebral occlusion.  Patient has had MRI within the past week without evidence for stroke.  He was unable to tolerate an MRI overnight, and now we discussed risks and benefits of leaving to follow-up with primary care versus repeat attempt of MRI.  Patient, son, both favor the patient leaving, to follow-up with primary care, as he is feeling better, continues to have no other new focal neurodeficits, with dizziness markedly improved.  We discussed importance of taking all medication as prescribed, explicit return precautions, and the patient discharged to follow-up with primary care.   Gerhard Munch, MD 03/16/23 [phone]

## 2023-03-17 ENCOUNTER — Telehealth: Payer: Self-pay

## 2023-03-17 ENCOUNTER — Encounter: Payer: Self-pay | Admitting: Neurology

## 2023-03-17 DIAGNOSIS — R42 Dizziness and giddiness: Secondary | ICD-10-CM

## 2023-03-17 MED ORDER — MECLIZINE HCL 12.5 MG PO TABS: 12.5000 mg | ORAL_TABLET | Freq: Three times a day (TID) | ORAL | 2 refills | Status: DC | PRN

## 2023-03-17 MED ORDER — ESCITALOPRAM OXALATE 10 MG PO TABS: 10.0000 mg | ORAL_TABLET | Freq: Every day | ORAL | 1 refills | Status: DC

## 2023-03-17 NOTE — Addendum Note (Signed)
Addended by: Joaquim Nam on: 03/17/2023 02:57 PM   Modules accepted: Orders

## 2023-03-17 NOTE — Telephone Encounter (Signed)
Rx sent and referral placed.

## 2023-03-17 NOTE — Transitions of Care (Post Inpatient/ED Visit) (Signed)
 03/17/2023  Name: [name] MRN: [id number] DOB: 1934/05/22  Today's TOC FU Call Status: Today's TOC FU Call Status:: Successful TOC FU Call Completed TOC FU Call Complete Date: 03/17/23 Patient's Name and Date of Birth confirmed.  Transition Care Management Follow-up Telephone Call Date of Discharge: 03/16/23 Discharge Facility: [facility]) Type of Discharge: Emergency Department Reason for ED Visit:  (dizziness and giddiness) How have you been since you were released from the hospital?: Better Any questions or concerns?: No  Items Reviewed: Did you receive and understand the discharge instructions provided?: Yes Medications obtained,verified, and reconciled?: Yes (Medications Reviewed) (needs a refill on Meclizine ) Any new allergies since your discharge?: No Dietary orders reviewed?: Yes Type of Diet Ordered:: heart healthy Do you have support at home?: Yes People in Home: child(ren), adult  Medications Reviewed Today: Medications Reviewed Today     Reviewed by Dvaughn Fickle, [place] LABOR, CMA (Certified Medical Assistant) on 03/17/23 at 1137  Med List Status: <None>   Medication Order Taking? Sig Documenting Provider Last Dose Status Informant  Accu-Chek Softclix Lancets lancets [id number] No TEST BLOOD SUGAR UP TO THREE TIMES DAILY [name], MD Taking Active Self, Pharmacy Records, Child  amLODipine  (NORVASC ) 10 MG tablet [id number]  TAKE 1 TABLET BY MOUTH IN THE  MORNING [name], MD  Active   aspirin  EC 81 MG tablet [id number] No Take 81 mg by mouth every evening. [provider] Taking Active Self, Pharmacy Records, Child  Blood Glucose Calibration (ACCU-CHEK AVIVA) SOLN [id number] No Use as directed monthly. E11.59  Insulin  dependent [name], MD Taking Active Self, Pharmacy Records, Child  Blood Glucose Monitoring Suppl (ACCU-CHEK AVIVA PLUS) w/Device KIT [id number] No Check sugar up to 3 times daily. E11.59  Insulin  dependent [name], MD Taking Active Self, Pharmacy Records, Child  carvedilol  (COREG ) 12.5 MG tablet [id number]  TAKE 1 TABLET BY MOUTH TWICE  DAILY WITH MEALS [name], MD  Active   COD LIVER OIL PO [id number] No Take 1 capsule by mouth 2 (two) times daily.  [provider] Taking Active Self, Pharmacy Records, Child  Continuous Glucose Receiver (FREESTYLE LIBRE 3 READER) DEVI [id number] No 1 each by Does not apply route as directed. [name], MD Taking Active Self, Pharmacy Records, Child  Continuous Glucose Sensor (FREESTYLE LIBRE 3 SENSOR) [place] [id number] No Place 1 sensor on the skin every 14 days. Use to check glucose continuously [name], MD Taking Active Self, Pharmacy Records, Child  cyanocobalamin  (,VITAMIN B-12,) 1000 MCG/ML injection [id number] No Inject 1 mL (1,000 mcg total) into the muscle every 30 (thirty) days. Dispense 3 of the 1 mL vials. [name], MD Taking Active Self, Pharmacy Records, Child           Med Note IDAMAE ALFREIDA LITTIE [place] Jan 22, 2023  5:24 PM) Patient unsure, does not have a specific day he gets his shot  diphenhydrAMINE  HCl (BENADRYL  ALLERGY PO) [id number] No Take 1 each by mouth at bedtime. Liquid Gels [provider] Taking Active Self, Child, Pharmacy Records  DROPLET PEN NEEDLES 30G X 8 MM MISC [id number] No USE  WITH  INSULIN   PEN [name], MD Taking Active Self, Pharmacy Records, Child  escitalopram  (LEXAPRO ) 10 MG tablet [id number]  Take 1 tablet (10 mg total) by mouth daily. [name], MD  Active   glimepiride  (AMARYL ) 4 MG tablet [id number] No Take 0.5 tablets by mouth daily with breakfast.  [provider] Taking Active Self, Pharmacy Records, Child  glucose blood (ONETOUCH ULTRA) test strip [id number] No USE TO CHECK UP TO 3 TIMES DAILY AS NEEDED [name], MD Taking Active Self, Pharmacy Records, Child  hydrALAZINE  (APRESOLINE ) 50 MG tablet [id number]  TAKE 1 TABLET BY MOUTH 3 TIMES  DAILY  [name], MD  Active   insulin  glargine, 1 Unit Dial , ([name]  SOLOSTAR) 300 UNIT/ML Solostar Pen [id number] No INJECT SUBCUTANEOUSLY 16  UNITS AT BEDTIME [name], MD Taking Active Self, Pharmacy Records, Child  insulin  lispro (HUMALOG  KWIKPEN) 100 UNIT/ML KwikPen [id number] No INJECT SUBCUTANEOUSLY 4-10 UNITS 3 TIMES DAILY BEFORE MEALS PER  SLIDING SCALE: 151-200 = 4 UN,  201-250 = 6 UN, 251-300 = 8 UN,  AND OVER 300 = 10 UN [name], MD Taking Active Self, Pharmacy Records, Child  lisinopril -hydrochlorothiazide  (ZESTORETIC ) 20-25 MG tablet [id number]  TAKE 1 TABLET BY MOUTH IN THE  MORNING [name], MD  Active   meclizine  (ANTIVERT ) 12.5 MG tablet [id number]  Take 1 tablet (12.5 mg total) by mouth 3 (three) times daily as needed for dizziness. Kingsley, Victoria K, DO  Active   naproxen  sodium (ALEVE ) 220 MG tablet [id number] No Take 220 mg by mouth at bedtime. [provider] Taking Active Self, Pharmacy Records, Child  ondansetron  (ZOFRAN ) 4 MG tablet [id number]  Take 1 tablet (4 mg total) by mouth every 6 (six) hours. Kingsley, Victoria K, DO  Active   potassium chloride  SA (KLOR-CON  M) 20 MEQ tablet [id number]  TAKE 1 TABLET BY MOUTH DAILY [name], MD  Active   SYRINGE-NEEDLE, DISP, 3 ML (B-D 3CC LUER-LOK SYR 25GX1) 25G X 1 3 ML MISC [id number] No USE TO INJECT VITAMIN B12 MONTHLY. [name], MD Taking Active Self, Pharmacy Records, Child  tamsulosin  (FLOMAX ) 0.4 MG CAPS capsule [id number] No TAKE 1 CAPSULE BY MOUTH DAILY  AFTER SUPPER [name], MD Taking Active Self, Pharmacy Records, Child  traMADol  (ULTRAM ) 50 MG tablet [id number] No TAKE 1 TABLET BY MOUTH EVERY 12 HOURS AS NEEDED FOR KNEE PAIN. [name], MD Taking Active Self, Pharmacy Records, Child            Home Care and Equipment/Supplies: Were Home Health Services Ordered?: NA Any new equipment or medical supplies ordered?: NA  Functional Questionnaire: Do you  need assistance with bathing/showering or dressing?: Yes (son assits him) Do you need assistance with meal preparation?: Yes (family assists him) Do you need assistance with eating?: No Do you have difficulty maintaining continence: No Do you need assistance with getting out of bed/getting out of a chair/moving?: No Do you have difficulty managing or taking your medications?: Yes (son manages meds)  Follow up appointments reviewed: PCP Follow-up appointment confirmed?: Yes Date of PCP follow-up appointment?: 03/19/23 Follow-up Provider: Arlyss Cleatus Specialist [facility] Follow-up appointment confirmed?: No Reason Specialist Follow-Up Not Confirmed:  (provider needs to place referral) Do you need transportation to your follow-up appointment?: No Do you understand care options if your condition(s) worsen?: Yes-patient verbalized understanding  Patient needs a refill on Meclizine  and Neurology referral per Discharge Instructions.   Luella Gardenhire, CMA  [facility] Team Direct Dial : [phone]

## 2023-03-19 ENCOUNTER — Inpatient Hospital Stay: Payer: Medicare Other | Admitting: Family Medicine

## 2023-03-19 NOTE — Transitions of Care (Post Inpatient/ED Visit) (Signed)
   03/19/2023  Name: [name] MRN: [id number] DOB: 1934-03-30  Today's TOC FU Call Status: Today's TOC FU Call Status:: Unsuccessful Call (1st Attempt) Unsuccessful Call (1st Attempt) Date: 03/09/23  Attempted to reach the patient regarding the most recent Inpatient/ED visit.  Follow Up Plan: No further outreach attempts will be made at this time. We have been unable to contact the patient.  Signature Avelina Essex, CMA (AAMA)  [facility] Program [phone]

## 2023-03-24 ENCOUNTER — Encounter (HOSPITAL_COMMUNITY): Payer: Self-pay

## 2023-03-24 ENCOUNTER — Emergency Department (HOSPITAL_COMMUNITY): Payer: Medicare Other

## 2023-03-24 ENCOUNTER — Ambulatory Visit: Payer: Medicare Other | Admitting: Family Medicine

## 2023-03-24 ENCOUNTER — Emergency Department (HOSPITAL_COMMUNITY)
Admission: EM | Admit: 2023-03-24 | Discharge: 2023-03-24 | Disposition: A | Discharge status: Home / Self Care | Payer: Medicare Other | Attending: Emergency Medicine | Admitting: Emergency Medicine

## 2023-03-24 ENCOUNTER — Encounter: Payer: Self-pay | Admitting: Family Medicine

## 2023-03-24 ENCOUNTER — Other Ambulatory Visit: Payer: Self-pay

## 2023-03-24 VITALS — BP 150/70 | HR 71 | Temp 97.9°F | Ht 71.0 in | Wt 225.0 lb

## 2023-03-24 DIAGNOSIS — R42 Dizziness and giddiness: Secondary | ICD-10-CM | POA: Diagnosis not present

## 2023-03-24 DIAGNOSIS — F445 Conversion disorder with seizures or convulsions: Secondary | ICD-10-CM | POA: Diagnosis not present

## 2023-03-24 DIAGNOSIS — I10 Essential (primary) hypertension: Secondary | ICD-10-CM | POA: Diagnosis not present

## 2023-03-24 DIAGNOSIS — I6501 Occlusion and stenosis of right vertebral artery: Secondary | ICD-10-CM | POA: Diagnosis not present

## 2023-03-24 DIAGNOSIS — R29818 Other symptoms and signs involving the nervous system: Secondary | ICD-10-CM | POA: Diagnosis not present

## 2023-03-24 DIAGNOSIS — R569 Unspecified convulsions: Secondary | ICD-10-CM | POA: Insufficient documentation

## 2023-03-24 DIAGNOSIS — R251 Tremor, unspecified: Secondary | ICD-10-CM | POA: Diagnosis not present

## 2023-03-24 DIAGNOSIS — W19XXXA Unspecified fall, initial encounter: Secondary | ICD-10-CM | POA: Diagnosis not present

## 2023-03-24 DIAGNOSIS — I491 Atrial premature depolarization: Secondary | ICD-10-CM | POA: Diagnosis not present

## 2023-03-24 DIAGNOSIS — Z7982 Long term (current) use of aspirin: Secondary | ICD-10-CM | POA: Insufficient documentation

## 2023-03-24 LAB — DIFFERENTIAL
Abs Immature Granulocytes: 0.05 10*3/uL (ref 0.00–0.07)
Basophils Absolute: 0.1 10*3/uL (ref 0.0–0.1)
Basophils Relative: 1 %
Eosinophils Absolute: 0.3 10*3/uL (ref 0.0–0.5)
Eosinophils Relative: 4 %
Immature Granulocytes: 1 %
Lymphocytes Relative: 26 %
Lymphs Abs: 2 10*3/uL (ref 0.7–4.0)
Monocytes Absolute: 0.6 10*3/uL (ref 0.1–1.0)
Monocytes Relative: 8 %
Neutro Abs: 4.5 10*3/uL (ref 1.7–7.7)
Neutrophils Relative %: 60 %

## 2023-03-24 LAB — PROTIME-INR
INR: 1 (ref 0.8–1.2)
Prothrombin Time: 13.8 s (ref 11.4–15.2)

## 2023-03-24 LAB — COMPREHENSIVE METABOLIC PANEL
ALT: 16 U/L (ref 0–44)
AST: 15 U/L (ref 15–41)
Albumin: 3.6 g/dL (ref 3.5–5.0)
Alkaline Phosphatase: 50 U/L (ref 38–126)
Anion gap: 11 (ref 5–15)
BUN: 16 mg/dL (ref 8–23)
CO2: 25 mmol/L (ref 22–32)
Calcium: 9.8 mg/dL (ref 8.9–10.3)
Chloride: 101 mmol/L (ref 98–111)
Creatinine, Ser: 1.28 mg/dL — ABNORMAL HIGH (ref 0.61–1.24)
GFR, Estimated: 55 mL/min — ABNORMAL LOW (ref >60)
Glucose, Bld: 202 mg/dL — ABNORMAL HIGH (ref 70–99)
Potassium: 3.4 mmol/L — ABNORMAL LOW (ref 3.5–5.1)
Sodium: 137 mmol/L (ref 135–145)
Total Bilirubin: 0.6 mg/dL (ref 0.0–1.2)
Total Protein: 6.7 g/dL (ref 6.5–8.1)

## 2023-03-24 LAB — ETHANOL: Alcohol, Ethyl (B): <10 mg/dL (ref <10)

## 2023-03-24 LAB — CBC
HCT: 41.1 % (ref 39.0–52.0)
Hemoglobin: 13.4 g/dL (ref 13.0–17.0)
MCH: 29.7 pg (ref 26.0–34.0)
MCHC: 32.6 g/dL (ref 30.0–36.0)
MCV: 91.1 fL (ref 80.0–100.0)
Platelets: 429 10*3/uL — ABNORMAL HIGH (ref 150–400)
RBC: 4.51 MIL/uL (ref 4.22–5.81)
RDW: 13.4 % (ref 11.5–15.5)
WBC: 7.5 10*3/uL (ref 4.0–10.5)
nRBC: 0 % (ref 0.0–0.2)

## 2023-03-24 LAB — APTT: aPTT: 31 s (ref 24–36)

## 2023-03-24 MED ORDER — LORAZEPAM 2 MG/ML IJ SOLN
1.0000 mg | INTRAMUSCULAR | Status: DC | PRN
  Administered 2023-03-24: 1 mg via INTRAVENOUS
  Filled 2023-03-24: qty 1

## 2023-03-24 NOTE — ED Provider Notes (Signed)
[place] EMERGENCY DEPARTMENT AT [facility] Provider Note   CSN: [id number] Arrival date & time: 03/24/23  1403     History {Add pertinent medical, surgical, social history, OB history to HPI:1} Chief Complaint  Patient presents with   Anxiety    [name] is a 89 y.o. male.  HPI Patient arrives via EMS from his physician's office.  Patient has been seen, evaluated here twice within the past 3 weeks.  Today was a follow-up following those visits.  In essence the patient has multiple medical issues, but was well until a few weeks ago.  About that time he began having episodes of dizziness, disequilibrium.  He was seen, evaluated, had MRI, return had MRA, but during the second visit was intolerant of repeat MRI and left to follow-up as an outpatient.  Concurrent with those visits the patient stopped taking his Lexapro.  Today he went for follow-up, and with ongoing dizziness, and then demonstration of seizure-like episodes he was sent here.  EMS staff report the patient was hemodynamically unremarkable, awake, alert, but had 2 episodes of transient shaking consistent with seizure-like episodes, though with no postictal phase, no prodrome, and the only brief duration.    Home Medications Prior to Admission medications   Medication Sig Start Date End Date Taking? Authorizing Provider  Accu-Chek Softclix Lancets lancets TEST BLOOD SUGAR UP TO THREE TIMES DAILY 01/23/20   Joaquim Nam, MD  amLODipine (NORVASC) 10 MG tablet TAKE 1 TABLET BY MOUTH IN THE  MORNING 03/10/23   Joaquim Nam, MD  aspirin EC 81 MG tablet Take 81 mg by mouth every evening.    [provider]  Blood Glucose Calibration (ACCU-CHEK AVIVA) SOLN Use as directed monthly. E11.59  Insulin dependent 12/06/18   Joaquim Nam, MD  Blood Glucose Monitoring Suppl (ACCU-CHEK AVIVA PLUS) w/Device KIT Check sugar up to 3 times daily. E11.59  Insulin dependent 11/30/18   Joaquim Nam, MD   carvedilol (COREG) 12.5 MG tablet TAKE 1 TABLET BY MOUTH TWICE  DAILY WITH MEALS 03/10/23   Joaquim Nam, MD  COD LIVER OIL PO Take 1 capsule by mouth 2 (two) times daily.     [provider]  Continuous Glucose Receiver (FREESTYLE LIBRE 3 READER) DEVI 1 each by Does not apply route as directed. 05/28/22   Joaquim Nam, MD  Continuous Glucose Sensor (FREESTYLE LIBRE 3 SENSOR) MISC Place 1 sensor on the skin every 14 days. Use to check glucose continuously 05/28/22   Joaquim Nam, MD  cyanocobalamin (,VITAMIN B-12,) 1000 MCG/ML injection Inject 1 mL (1,000 mcg total) into the muscle every 30 (thirty) days. Dispense 3 of the 1 mL vials. 10/29/18   Joaquim Nam, MD  diphenhydrAMINE HCl (BENADRYL ALLERGY PO) Take 1 each by mouth at bedtime. Liquid Gels    [provider]  DROPLET PEN NEEDLES 30G X 8 MM MISC USE  WITH  INSULIN  PEN 02/03/19   Joaquim Nam, MD  escitalopram (LEXAPRO) 10 MG tablet Take 1 tablet (10 mg total) by mouth daily. 03/17/23 03/16/24  Joaquim Nam, MD  glucose blood (ONETOUCH ULTRA) test strip USE TO CHECK UP TO 3 TIMES DAILY AS NEEDED 09/01/22   Joaquim Nam, MD  hydrALAZINE (APRESOLINE) 50 MG tablet TAKE 1 TABLET BY MOUTH 3 TIMES  DAILY 03/10/23   Joaquim Nam, MD  insulin glargine, 1 Unit Dial, ([name] SOLOSTAR) 300 UNIT/ML Solostar Pen INJECT SUBCUTANEOUSLY 16  UNITS AT BEDTIME 01/02/23   Joaquim Nam, MD  insulin lispro (HUMALOG KWIKPEN) 100 UNIT/ML KwikPen INJECT SUBCUTANEOUSLY 4-10 UNITS 3 TIMES DAILY BEFORE MEALS PER  SLIDING SCALE: 151-200 = 4 UN,  201-250 = 6 UN, 251-300 = 8 UN,  AND OVER 300 = 10 UN 01/02/23   Joaquim Nam, MD  lisinopril-hydrochlorothiazide (ZESTORETIC) 20-25 MG tablet TAKE 1 TABLET BY MOUTH IN THE  MORNING 03/10/23   Joaquim Nam, MD  meclizine (ANTIVERT) 12.5 MG tablet Take 1 tablet (12.5 mg total) by mouth 3 (three) times daily as needed for dizziness. 03/17/23   Joaquim Nam, MD  naproxen sodium  (ALEVE) 220 MG tablet Take 220 mg by mouth at bedtime.    [provider]  ondansetron (ZOFRAN) 4 MG tablet Take 1 tablet (4 mg total) by mouth every 6 (six) hours. 03/06/23   Elayne Snare K, DO  potassium chloride SA (KLOR-CON M) 20 MEQ tablet TAKE 1 TABLET BY MOUTH DAILY 03/10/23   Joaquim Nam, MD  SYRINGE-NEEDLE, DISP, 3 ML (B-D 3CC LUER-LOK SYR 25GX1") 25G X 1" 3 ML MISC USE TO INJECT VITAMIN B12 MONTHLY. 08/22/19   Joaquim Nam, MD  tamsulosin (FLOMAX) 0.4 MG CAPS capsule TAKE 1 CAPSULE BY MOUTH DAILY  AFTER SUPPER 12/05/22   Joaquim Nam, MD  traMADol (ULTRAM) 50 MG tablet TAKE 1 TABLET BY MOUTH EVERY 12 HOURS AS NEEDED FOR KNEE PAIN. 11/19/22   Joaquim Nam, MD      Allergies    Morphine, Atorvastatin, Codeine phosphate, and Metformin and related    Review of Systems   Review of Systems  Physical Exam Updated Vital Signs BP (!) 154/89 (BP Location: Right Arm)   Pulse 61   Temp 97.9 F (36.6 C) (Oral)   Resp 16   SpO2 95%  Physical Exam Vitals and nursing note reviewed.  Constitutional:      General: He is not in acute distress.    Appearance: He is well-developed.  HENT:     Head: Normocephalic and atraumatic.  Eyes:     Conjunctiva/sclera: Conjunctivae normal.  Cardiovascular:     Rate and Rhythm: Normal rate and regular rhythm.  Pulmonary:     Effort: Pulmonary effort is normal. No respiratory distress.     Breath sounds: No stridor.  Abdominal:     General: There is no distension.  Skin:    General: Skin is warm and dry.  Neurological:     Mental Status: He is alert and oriented to person, place, and time.     Motor: Atrophy present. No tremor or abnormal muscle tone.     ED Results / Procedures / Treatments   Labs (all labs ordered are listed, but only abnormal results are displayed) Labs Reviewed  PROTIME-INR  APTT  CBC  DIFFERENTIAL  COMPREHENSIVE METABOLIC PANEL  ETHANOL    EKG None  Radiology No results  found.  Procedures Procedures  {Document cardiac monitor, telemetry assessment procedure when appropriate:1}  Medications Ordered in ED Medications  LORazepam (ATIVAN) injection 1 mg (has no administration in time range)    ED Course/ Medical Decision Making/ A&P   {   Click here for ABCD2, HEART and other calculatorsREFRESH Note before signing :1}                              Medical Decision Making Patient with multiple medical problems  Amount and/or  Complexity of Data Reviewed Labs: ordered. Radiology: ordered.  Risk Prescription drug management.   ***  {Document critical care time when appropriate:1} {Document review of labs and clinical decision tools ie heart score, Chads2Vasc2 etc:1}  {Document your independent review of radiology images, and any outside records:1} {Document your discussion with family members, caretakers, and with consultants:1} {Document social determinants of health affecting pt's care:1} {Document your decision making why or why not admission, treatments were needed:1} Final Clinical Impression(s) / ED Diagnoses Final diagnoses:  None    Rx / DC Orders ED Discharge Orders     None

## 2023-03-24 NOTE — ED Triage Notes (Signed)
Pt BIB [facility] from his PCP for diagnosed pseudoseizures that have been increasing in frequency. Pt has diagnosed anxiety, stopped taking his lexapro in January, but restarted last week. Pt had 2 seizure-like episodes with EMS with no LOC, change in vitals, or post-ictal state. Pt has hx of vertebral artery occlusion. Aox4 and VSS.

## 2023-03-24 NOTE — Discharge Instructions (Signed)
Please follow-up with your PCP for further management and evaluation.  We had a shared decision-making conversation offering further evaluation by neurology tonight but you would prefer to go home and follow-up with your primary doctor and have the MRI did not show evidence of acute stroke.  This is reasonable given your well appearance.  If any symptoms recur, change, or worsen acutely, please return to the nearest emergency department.

## 2023-03-24 NOTE — ED Notes (Signed)
Patient discharged home. VSS. Patient ambulatory out of treatment area with clean and steady gait. All questions answered at time of discharge. Belongings sent home with patient.

## 2023-03-24 NOTE — ED Provider Notes (Signed)
3:54 PM Care assumed from Dr. Jeraldine Loots.  At time of transfer care, patient waiting for recommendation of neurology and MRI to be completed to determine disposition.   6:29 PM MRI returned without acute intracranial abnormality stable vertebral flow compared to prior.  I went to reassess the patient and he still feels that something is not right.  Family said that he had 13 of those episodes of diffuse body shaking today and his PCP is concerned about seizures.  Will call neurology again and see what they would like to do given the multiple visits he has had and the PCP concerns to send him here.  Previous team suspected he may need EEG and admission for monitoring.  8:03 PM Neurology was going to come see patient and further assess rule out seizures however patient says he would like to go and family wants to take him back to PCP.  They understand return precautions and follow-up instructions.  They had no other questions or concerns and patient was discharged in stable condition.   Clinical Impression: 1. Episode of shaking     Disposition: Discharge  Condition: Good  I have discussed the results, Dx and Tx plan with the pt(& family if present). He/she/they expressed understanding and agree(s) with the plan. Discharge instructions discussed at great length. Strict return precautions discussed and pt &/or family have verbalized understanding of the instructions. No further questions at time of discharge.    New Prescriptions   No medications on file    Follow Up: Joaquim Nam, MD [address] [place] [address] [phone]        Charod Slawinski, Canary Brim, MD 03/24/23 2005

## 2023-03-24 NOTE — Patient Instructions (Addendum)
Make sure your med list is accurate and if you are still taking glimepiride then stop it.    If you have more trouble with lower sugars, then let me know.  Take care.  Glad to see you.  Keep taking lexapro 10mg  a day.  We may need to increase that to 20mg  a day but I wouldn't do that yet.    Keep taking meclizine as needed.    Go to the lab on the way out.   If you have mychart we'll likely use that to update you.

## 2023-03-24 NOTE — Progress Notes (Unsigned)
Rare low sugars overall.  Felt low at the OV, took a snack and felt better.  This AM sugar was ~160 at home.  244 at the OV after a snack.    Dizzy sensation, went to ER.  Took meclizine in the meantime.  Using walker.    Still on lexparo.  Anxiety some better with lexapro.    Taking meclizine 2 times pr day now, down from 3 per day.     Unclear how much anxiety contributes to the events.  Being on lexapro clearly helped with tremors and anxiety.    He ran out of lexapro in January.  Restarted and improved in the meantime.

## 2023-03-25 ENCOUNTER — Telehealth: Payer: Self-pay

## 2023-03-25 DIAGNOSIS — F411 Generalized anxiety disorder: Secondary | ICD-10-CM

## 2023-03-25 DIAGNOSIS — F445 Conversion disorder with seizures or convulsions: Secondary | ICD-10-CM

## 2023-03-25 MED ORDER — ESCITALOPRAM OXALATE 20 MG PO TABS: 20.0000 mg | ORAL_TABLET | Freq: Every day | ORAL | 1 refills | Status: DC

## 2023-03-25 MED ORDER — ESCITALOPRAM OXALATE 10 MG PO TABS: 20.0000 mg | ORAL_TABLET | Freq: Every day | ORAL | Status: DC

## 2023-03-25 NOTE — Addendum Note (Signed)
Addended by: Joaquim Nam on: 03/25/2023 05:32 PM   Modules accepted: Orders

## 2023-03-25 NOTE — Telephone Encounter (Signed)
I spoke with [name] pts son (DPR signed)and he said no one told him that neurolgist was coming to see his father; pt wanted to go home and [name] took pt home. [name] said he did talk with neurologist (does not know neurologist name ) and was advised for pt to see psychiatrist. [name] declined appt today because pt "is doing fine today". No tremors or problems. [name] wants to know if Dr Para March wants to do referral to psychiatrist or not. Pt already has appt with Dr Karel Jarvis on 04/06/23 and [name] plans to take pt to that appt.UC & ED precautions given and [name] voiced understanding. Michael request cb after note reviewed by Dr Para March who is out of office today and sending note also to Dr G who is in office today.

## 2023-03-25 NOTE — Telephone Encounter (Signed)
Spoke with patients son Casimiro Needle. He is aware of the referral, he also request the refill. He wants to thank you for everything.

## 2023-03-25 NOTE — Telephone Encounter (Signed)
I sent the refill for the higher dose- so he'll only need 1 tab of the 20mg  dose.  Thanks.

## 2023-03-25 NOTE — Telephone Encounter (Signed)
I would increase lexapro to 20mg  a day and I put in the referral to psychiatry.  Let me know if he needs a refill.  Thanks.

## 2023-03-25 NOTE — Telephone Encounter (Signed)
Marland Kitchen

## 2023-03-25 NOTE — Assessment & Plan Note (Signed)
See above.  ER eval via EMS.   51 minutes were devoted to patient care in this encounter (this includes time spent reviewing the patient's file/history, interviewing and examining the patient, counseling/reviewing plan with patient).

## 2023-03-25 NOTE — Transitions of Care (Post Inpatient/ED Visit) (Signed)
03/25/2023  Name: [name] MRN: [id number] DOB: 1934/03/30  Today's TOC FU Call Status: Today's TOC FU Call Status:: Successful TOC FU Call Completed TOC FU Call Complete Date: 03/25/23 Patient's Name and Date of Birth confirmed.  Transition Care Management Follow-up Telephone Call Date of Discharge: 03/24/23 Discharge Facility: [facility]) Type of Discharge: Emergency Department Reason for ED Visit: Other: (tremor) How have you been since you were released from the hospital?: Better Any questions or concerns?: No  Items Reviewed: Did you receive and understand the discharge instructions provided?: Yes Medications obtained,verified, and reconciled?: Yes (Medications Reviewed) Any new allergies since your discharge?: No Dietary orders reviewed?: Yes Do you have support at home?: Yes People in Home: child(ren), adult  Medications Reviewed Today: Medications Reviewed Today     Reviewed by Karena Addison, LPN (Licensed Practical Nurse) on 03/25/23 at 1429  Med List Status: <None>   Medication Order Taking? Sig Documenting Provider Last Dose Status Informant  Accu-Chek Softclix Lancets lancets [id number] No TEST BLOOD SUGAR UP TO THREE TIMES DAILY Joaquim Nam, MD Taking Active Self, Pharmacy Records, Child  amLODipine (NORVASC) 10 MG tablet [id number] No TAKE 1 TABLET BY MOUTH IN THE  MORNING Joaquim Nam, MD Taking Active   aspirin EC 81 MG tablet [id number] No Take 81 mg by mouth every evening. [provider] Taking Active Self, Pharmacy Records, Child  Blood Glucose Calibration (ACCU-CHEK AVIVA) SOLN [id number] No Use as directed monthly. E11.59  Insulin dependent Joaquim Nam, MD Taking Active Self, Pharmacy Records, Child  Blood Glucose Monitoring Suppl (ACCU-CHEK AVIVA PLUS) w/Device KIT [id number] No Check sugar up to 3 times daily. E11.59  Insulin dependent Joaquim Nam, MD Taking Active Self, Pharmacy Records, Child  carvedilol (COREG)  12.5 MG tablet [id number] No TAKE 1 TABLET BY MOUTH TWICE  DAILY WITH MEALS Joaquim Nam, MD Taking Active   COD LIVER OIL PO [id number] No Take 1 capsule by mouth 2 (two) times daily.  [provider] Taking Active Self, Pharmacy Records, Child  Continuous Glucose Receiver (FREESTYLE LIBRE 3 READER) DEVI [id number] No 1 each by Does not apply route as directed. Joaquim Nam, MD Taking Active Self, Pharmacy Records, Child  Continuous Glucose Sensor (FREESTYLE LIBRE 3 SENSOR) [place] [id number] No Place 1 sensor on the skin every 14 days. Use to check glucose continuously Joaquim Nam, MD Taking Active Self, Pharmacy Records, Child  cyanocobalamin (,VITAMIN B-12,) 1000 MCG/ML injection [id number] No Inject 1 mL (1,000 mcg total) into the muscle every 30 (thirty) days. Dispense 3 of the 1 mL vials. Joaquim Nam, MD Taking Active Self, Pharmacy Records, Child           Med Note Para March, Porfirio Oar Mar 24, 2023 12:27 PM)    diphenhydrAMINE HCl (BENADRYL ALLERGY PO) [id number] No Take 1 each by mouth at bedtime. Liquid Gels [provider] Taking Active Self, Child, Pharmacy Records  DROPLET PEN NEEDLES 30G X 8 MM MISC [id number] No USE  WITH  INSULIN  PEN Joaquim Nam, MD Taking Active Self, Pharmacy Records, Child  escitalopram (LEXAPRO) 10 MG tablet [id number]  Take 2 tablets (20 mg total) by mouth daily. Joaquim Nam, MD  Active   glucose blood [facility] ULTRA) test strip [id number] No USE TO CHECK UP TO 3 TIMES DAILY AS NEEDED Joaquim Nam, MD Taking Active Self, Pharmacy Records, Child  hydrALAZINE (APRESOLINE) 50 MG tablet [id number] No TAKE 1 TABLET BY  MOUTH 3 TIMES  DAILY Joaquim Nam, MD Taking Active   insulin glargine, 1 Unit Dial, ([name] SOLOSTAR) 300 UNIT/ML Solostar Pen [id number] No INJECT SUBCUTANEOUSLY 16  UNITS AT BEDTIME Joaquim Nam, MD Taking Active Self, Pharmacy Records, Child  insulin lispro (HUMALOG KWIKPEN) 100 UNIT/ML KwikPen [id number]  No INJECT SUBCUTANEOUSLY 4-10 UNITS 3 TIMES DAILY BEFORE MEALS PER  SLIDING SCALE: 151-200 = 4 UN,  201-250 = 6 UN, 251-300 = 8 UN,  AND OVER 300 = 10 Rickard Rhymes, MD Taking Active Self, Pharmacy Records, Child  lisinopril-hydrochlorothiazide (ZESTORETIC) 20-25 MG tablet [id number] No TAKE 1 TABLET BY MOUTH IN THE  MORNING Joaquim Nam, MD Taking Active   meclizine (ANTIVERT) 12.5 MG tablet [id number] No Take 1 tablet (12.5 mg total) by mouth 3 (three) times daily as needed for dizziness. Joaquim Nam, MD Taking Active   naproxen sodium (ALEVE) 220 MG tablet [id number] No Take 220 mg by mouth at bedtime. [provider] Taking Active Self, Pharmacy Records, Child  ondansetron (ZOFRAN) 4 MG tablet [id number] No Take 1 tablet (4 mg total) by mouth every 6 (six) hours. Rexford Maus, DO Taking Active   potassium chloride SA (KLOR-CON M) 20 MEQ tablet [id number] No TAKE 1 TABLET BY MOUTH DAILY Joaquim Nam, MD Taking Active   SYRINGE-NEEDLE, DISP, 3 ML (B-D 3CC LUER-LOK SYR 25GX1") 25G X 1" 3 ML MISC [id number] No USE TO INJECT VITAMIN B12 MONTHLY. Joaquim Nam, MD Taking Active Self, Pharmacy Records, Child  tamsulosin [facility]) 0.4 MG CAPS capsule [id number] No TAKE 1 CAPSULE BY MOUTH DAILY  AFTER SUPPER Joaquim Nam, MD Taking Active Self, Pharmacy Records, Child  traMADol (ULTRAM) 50 MG tablet [id number] No TAKE 1 TABLET BY MOUTH EVERY 12 HOURS AS NEEDED FOR KNEE PAIN. Joaquim Nam, MD Taking Active Self, Pharmacy Records, Child            Home Care and Equipment/Supplies: Were Home Health Services Ordered?: NA Any new equipment or medical supplies ordered?: NA  Functional Questionnaire: Do you need assistance with bathing/showering or dressing?: No Do you need assistance with meal preparation?: No Do you need assistance with eating?: No Do you have difficulty maintaining continence: No Do you need assistance with getting out of bed/getting out of a  chair/moving?: No Do you have difficulty managing or taking your medications?: No  Follow up appointments reviewed: PCP Follow-up appointment confirmed?: NA Specialist Hospital Follow-up appointment confirmed?: Yes Date of Specialist follow-up appointment?: 04/06/23 Follow-Up Specialty Provider:: neuro Do you need transportation to your follow-up appointment?: No Do you understand care options if your condition(s) worsen?: Yes-patient verbalized understanding    SIGNATURE Karena Addison, LPN [facility] Nurse Health Advisor Direct Dial [phone]

## 2023-04-06 ENCOUNTER — Ambulatory Visit: Payer: Medicare Other | Admitting: Neurology

## 2023-04-06 ENCOUNTER — Encounter: Payer: Self-pay | Admitting: Neurology

## 2023-04-06 VITALS — BP 126/57 | HR 83 | Ht 72.0 in | Wt 225.2 lb

## 2023-04-06 DIAGNOSIS — R251 Tremor, unspecified: Secondary | ICD-10-CM

## 2023-04-06 DIAGNOSIS — I672 Cerebral atherosclerosis: Secondary | ICD-10-CM | POA: Diagnosis not present

## 2023-04-06 DIAGNOSIS — R42 Dizziness and giddiness: Secondary | ICD-10-CM

## 2023-04-06 NOTE — Patient Instructions (Signed)
 Good to see you again. Glad to hear symptoms are overall better.  Continue to monitor cholesterol levels with PCP, goal LDL is less than 70. Continue daily aspirin.  2. If spinning sensation recurs, recommend Vestibular therapy  3. The shaking episodes are not seizures, they are typically due to anxiety, continue anxiety medication  4. Follow-up as needed, call for any changes

## 2023-04-06 NOTE — Progress Notes (Signed)
 NEUROLOGY CONSULTATION NOTE  [name] MRN: [id number] DOB: 1934-03-31  Referring provider: Dr. Crawford Givens  Primary care provider: Dr. Crawford Givens  Reason for consult:  dizziness  Dear Dr Para March:  Thank you for your kind referral of [name] for consultation of the above symptoms. Although his history is well known to you, please allow me to reiterate it for the purpose of our medical record. He is alone in the office today. Records and images were personally reviewed where available.   HISTORY OF PRESENT ILLNESS: This is an 89 year old right-handed man with a history of hypertension, CAD, CHF, DM, melanoma, seen previously in our office in March 2016 after he had an episode of dizziness in 12/2013 followed by shaking episodes, presenting for symptom recurrence. He had an EEG at that time with 3 episodes of wooziness and 5 episodes of jerking/staring with no EEG changes seen. He was treated with Keppra then switched to Depakote. The shakiness recurred in 09/2014 while at a friend's funeral. He had an episode on his office visit on 09/18/14 with irregular, asynchronous shaking of the arms and legs, hyperventilating to the point of wheezing. It was non-epileptic and suggestive of a panic attack. Sertraline was recommended and he was lost to follow-up. He reports that the shaking episodes quieted down for 8 years until 01/22/23 when he was brought to the ER after a tremulous episode and dizziness. He feels the dizziness started after he donated blood on 01/20/23. He describes a spinning sensation where he could not do anything and could feel his neck popping. He was awake the entire time. EEG where one of tremulous episodes was captured was normal. Inpatient Neurology also witnessed an episode during which he was redirectable with maintained consciousness, able to control movements with deep breaths. After tremors resolved, he became tearful discussing his late wife and his daughter.  His son reported he felt the patient was becoming increasingly anxious and depressed. He was back in the ER on 03/06/23 for dizziness. MRI brain no acute changes, there was moderate diffuse atrophy, abnormal signal in the right V4 segment. He was back in the ER on 03/15/23 for dizziness. MRA had "showed diminished signal in the distal V3 and proximal V4 segments of the right vertebral artery with absent signal distally consistent with occlusion which is new from 2015. Only faint flow related enhancement is visualized in the right PICA. The basilar artery is patent with an apparent small linear filling defect proximally near the vertebrobasilar junction, indeterminate for artifact, a small amount of thrombus, or localized dissection. A normal variant fenestration could also have this appearance, however one was not apparent on the prior CTA." He was again in the ER on 03/24/23 for transient shaking with no loss of consciousness as he was leaving his PCP office. Per notes, Lexapro clearly helped with anxiety and tremors, his son helps manage medications. He feels he cannot control the movements. He denies any olfactory/gustatory hallucinations, gaps in time, focal numbness/tingling/weakness. No headaches, no tinnitus. He has a cataract on the left eye. He has neck, back, and knee arthritis, both knees just give out L>R. He uses a walker or cane to ambulate. He "sleeps like a log." He does think the Lexapro is helping, he is having less of the shaking. He talks about his daughter who passed away in Jun 10, 2018, it "really hurt more than anything." His son has been living with him for 4 years.    PAST MEDICAL HISTORY:  Past Medical History:  Diagnosis Date   Anxiety    Borderline hyperlipidemia    CAD (coronary artery disease)    Colon polyps    Complication of anesthesia    trouble urinating after anesthesia   Diverticulosis of colon    DJD (degenerative joint disease)    DM (diabetes mellitus) (HCC)    Adult  onset  type 2   History of gastritis    History of kidney stones    surgery to remove   History of pyelonephritis    History of syncope    HTN (hypertension)    Hypertrophy of prostate with urinary obstruction and other lower urinary tract symptoms (LUTS)    Melanoma (HCC)    back of neck   Melanoma (HCC) 2022   per dermatology   Myoclonus    Pancreatitis due to biliary obstruction several years ago   treated by Dr. Lina Sar    PAST SURGICAL HISTORY: Past Surgical History:  Procedure Laterality Date   APPENDECTOMY     cartarized  nose blood vessel     left side   CATARACT EXTRACTION     RIGHT EYE   CHOLECYSTECTOMY     cystoscopy  04/17/11   stent placed   CYSTOSCOPY W/ URETERAL STENT PLACEMENT  04/17/2011   Procedure: CYSTOSCOPY WITH RETROGRADE PYELOGRAM/URETERAL STENT PLACEMENT;  Surgeon: Lindaann Slough, MD;  Location: [facility];  Service: Urology;  Laterality: Left;   CYSTOSCOPY WITH RETROGRADE PYELOGRAM, URETEROSCOPY AND STENT PLACEMENT Left 04/18/2013   Procedure: CYSTOSCOPY WITH LEFT RETROGRADE PYELOGRAM, LEFT URETEROSCOPY AND LASER LITHOTRIPSY LEFT STENT PLACEMENT;  Surgeon: Crecencio Mc, MD;  Location: [facility];  Service: Urology;  Laterality: Left;   HOLMIUM LASER APPLICATION Left 04/18/2013   Procedure: HOLMIUM LASER APPLICATION;  Surgeon: Crecencio Mc, MD;  Location: [facility];  Service: Urology;  Laterality: Left;   INCISION AND DRAINAGE ABSCESS Left 04/04/2020   Procedure: Angelina Pih OF LEFT ARM HEMATOMA;  Surgeon: Almond Lint, MD;  Location: [facility];  Service: General;  Laterality: Left;  left   IRRIGATION AND DEBRIDEMENT ABSCESS Left 10/05/2014   Procedure: Leighton Ruff D LEFT INDEX FINGER;  Surgeon: Knute Neu, MD;  Location: [facility];  Service: Plastics;  Laterality: Left;   KNEE SURGERY     bilateral ARTHROSCOPY X2 TO EACH KNEE   MELANOMA EXCISION  2018   MELANOMA EXCISION WITH SENTINEL LYMPH NODE BIOPSY Left 03/29/2020   Procedure: WIDE LOCAL EXCISION, ADVANCED FLAP CLOSURE LEFT ARM  MELANOMA DEFECT WITH SENTINEL LYMPH NODE MAPPING AND BIOPSY;  Surgeon: Almond Lint, MD;  Location: [facility];  Service: General;  Laterality: Left;   S/P cysto & stents for kidnedy stone 11/08 by Dr. Wanda Plump     S/P ELap 1986 w/excision of leiomyoma @ GE junction, Meckle's divertic resected, & cholecystectomy     SHOULDER SURGERY  04/2005   left by Dr. Luiz Blare    MEDICATIONS: Current Outpatient Medications on File Prior to Visit  Medication Sig Dispense Refill   Accu-Chek Softclix Lancets lancets TEST BLOOD SUGAR UP TO THREE TIMES DAILY 300 each 3   amLODipine (NORVASC) 10 MG tablet TAKE 1 TABLET BY MOUTH IN THE  MORNING 100 tablet 2   aspirin EC 81 MG tablet Take 81 mg by mouth every evening.     Blood Glucose Calibration (ACCU-CHEK AVIVA) SOLN Use as directed monthly. E11.59  Insulin dependent 1 each 3   Blood Glucose Monitoring Suppl (ACCU-CHEK AVIVA PLUS) w/Device KIT Check sugar up to 3 times daily. E11.59  Insulin dependent 1 kit 0   carvedilol (COREG) 12.5 MG tablet TAKE 1 TABLET BY MOUTH TWICE  DAILY WITH MEALS 200 tablet 2   COD LIVER OIL PO Take 1 capsule by mouth 2 (two) times daily.      Continuous Glucose Receiver (FREESTYLE LIBRE 3 READER) DEVI 1 each by Does not apply route as directed. 1 each 0   Continuous Glucose Sensor (FREESTYLE LIBRE 3 SENSOR) MISC Place 1 sensor on the skin every 14 days. Use to check glucose continuously 2 each 3   cyanocobalamin (,VITAMIN B-12,) 1000 MCG/ML injection Inject 1 mL (1,000 mcg total) into the muscle every 30 (thirty) days. Dispense 3 of the 1 mL vials. 3 mL 3   diphenhydrAMINE HCl (BENADRYL ALLERGY PO) Take 1 each by mouth at bedtime. Liquid Gels     DROPLET PEN NEEDLES 30G X 8 MM MISC USE  WITH  INSULIN  PEN 400 each 5   escitalopram (LEXAPRO) 20 MG tablet Take 1 tablet (20 mg total) by mouth daily. 90 tablet 1   glucose blood (ONETOUCH ULTRA) test strip USE TO CHECK UP TO 3 TIMES DAILY AS NEEDED 300 strip 2   hydrALAZINE (APRESOLINE) 50  MG tablet TAKE 1 TABLET BY MOUTH 3 TIMES  DAILY 300 tablet 2   insulin glargine, 1 Unit Dial, ([name] SOLOSTAR) 300 UNIT/ML Solostar Pen INJECT SUBCUTANEOUSLY 16  UNITS AT BEDTIME 4.5 mL 2   insulin lispro (HUMALOG KWIKPEN) 100 UNIT/ML KwikPen INJECT SUBCUTANEOUSLY 4-10 UNITS 3 TIMES DAILY BEFORE MEALS PER  SLIDING SCALE: 151-200 = 4 UN,  201-250 = 6 UN, 251-300 = 8 UN,  AND OVER 300 = 10 UN 30 mL 2   lisinopril-hydrochlorothiazide (ZESTORETIC) 20-25 MG tablet TAKE 1 TABLET BY MOUTH IN THE  MORNING 100 tablet 2   meclizine (ANTIVERT) 12.5 MG tablet Take 1 tablet (12.5 mg total) by mouth 3 (three) times daily as needed for dizziness. 30 tablet 2   naproxen sodium (ALEVE) 220 MG tablet Take 220 mg by mouth at bedtime.     ondansetron (ZOFRAN) 4 MG tablet Take 1 tablet (4 mg total) by mouth every 6 (six) hours. 12 tablet 0   potassium chloride SA (KLOR-CON M) 20 MEQ tablet TAKE 1 TABLET BY MOUTH DAILY 100 tablet 2   SYRINGE-NEEDLE, DISP, 3 ML (B-D 3CC LUER-LOK SYR 25GX1") 25G X 1" 3 ML MISC USE TO INJECT VITAMIN B12 MONTHLY. 3 each 1   tamsulosin (FLOMAX) 0.4 MG CAPS capsule TAKE 1 CAPSULE BY MOUTH DAILY  AFTER SUPPER 100 capsule 2   traMADol (ULTRAM) 50 MG tablet TAKE 1 TABLET BY MOUTH EVERY 12 HOURS AS NEEDED FOR KNEE PAIN. 60 tablet 2   No current facility-administered medications on file prior to visit.    ALLERGIES: Allergies  Allergen Reactions   Morphine Nausea Only, Other (See Comments) and Nausea And Vomiting    Can take with food and usually doesn't cause nausea   Atorvastatin     Intolerant - nausea/myalgias   Codeine Phosphate Nausea And Vomiting   Metformin And Related Diarrhea    FAMILY HISTORY: Family History  Problem Relation Age of Onset   Heart attack Mother    Dementia Brother    Colon cancer Neg Hx    Prostate cancer Neg Hx     SOCIAL HISTORY: Social History   Socioeconomic History   Marital status: Widowed    Spouse name: Not on file   Number of children: 2    Years of  education: Not on file   Highest education level: Not on file  Occupational History   Occupation: Retired  Tobacco Use   Smoking status: Never   Smokeless tobacco: Never  Vaping Use   Vaping status: Never Used  Substance and Sexual Activity   Alcohol use: No    Alcohol/week: 0.0 standard drinks of alcohol   Drug use: No   Sexual activity: Not Currently  Other Topics Concern   Not on file  Social History Narrative   Widowed 2004/05/26   Initially 2 kids- 1 son still alive as of 05/27/22.  Daughter died 27-Jun-2018 in coronavirus pandemic.  Son lives with patient.    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   Right handed    Social Drivers of Corporate investment banker Strain: Low Risk  (07/22/2022)   Overall Financial Resource Strain (CARDIA)    Difficulty of Paying Living Expenses: Not hard at all  Food Insecurity: No Food Insecurity (01/23/2023)   Hunger Vital Sign    Worried About Running Out of Food in the Last Year: Never true    Ran Out of Food in the Last Year: Never true  Transportation Needs: No Transportation Needs (01/23/2023)   PRAPARE - Administrator, Civil Service (Medical): No    Lack of Transportation (Non-Medical): No  Physical Activity: Sufficiently Active (07/22/2022)   Exercise Vital Sign    Days of Exercise per Week: 7 days    Minutes of Exercise per Session: 30 min  Stress: No Stress Concern Present (07/22/2022)   [employer] of Occupational Health - Occupational Stress Questionnaire    Feeling of Stress : Not at all  Social Connections: Moderately Isolated (07/22/2022)   Social Connection and Isolation Panel [NHANES]    Frequency of Communication with Friends and Family: More than three times a week    Frequency of Social Gatherings with Friends and Family: More than three times a week    Attends Religious Services: More than 4 times per year    Active Member of [employer] or Organizations: No    Attends Tax inspector Meetings: Never    Marital Status: Widowed  Intimate Partner Violence: Not At Risk (01/23/2023)   Humiliation, Afraid, Rape, and Kick questionnaire    Fear of Current or Ex-Partner: No    Emotionally Abused: No    Physically Abused: No    Sexually Abused: No     PHYSICAL EXAM: Vitals:   04/06/23 1240  BP: (!) 126/57  Pulse: 83  SpO2: 90%   General: No acute distress Head:  Normocephalic/atraumatic Skin/Extremities: No rash, no edema Neurological Exam: Mental status: alert and awake, no dysarthria or aphasia, Fund of knowledge is appropriate.  Attention and concentration are normal. Cranial nerves: CN I: not tested CN II: pupils equal, round, visual fields intact CN III, IV, VI:  full range of motion, no nystagmus, no ptosis CN V: facial sensation intact CN VII: upper and lower face symmetric CN VIII: hearing intact to conversation CN XI: sternocleidomastoid and trapezius muscles intact CN XII: tongue midline Bulk & Tone: normal, no fasciculations. Motor: 5/5 throughout with no pronator drift. Sensation: intact to light touch, cold, pin, vibration sense.  No extinction to double simultaneous stimulation.  Romberg test negative Deep Tendon Reflexes: +1 throughout Cerebellar: no incoordination on finger to nose testing Gait: slow and cautious, no ataxia Tremor: none in office today.   IMPRESSION: This is an 89 year old  right-handed man with a history of hypertension, CAD, CHF, DM, melanoma, seen previously in our office in March 2016 after he had an episode of dizziness in 12/2013 followed by shaking episodes, presenting for symptom recurrence. MRI brain no acute changes, there is intracranial atherosclerosis in the posterior circulation which may potentially cause dizziness however would not cause the shaking episodes, which have been captured on EEG previously and are non-epileptic. He notes the shaking is better with Lexapro. We discussed intracranial  atherosclerosis and the importance of control of vascular risk factors, continue daily aspirin. If spinning sensation/vertigo recurs, recommend Vestibular therapy. All his questions were answered to the best of my abilities. Follow-up as needed, call for any changes.    Thank you for allowing me to participate in the care of this patient. Please do not hesitate to call for any questions or concerns.   Patrcia Dolly, M.D.  CC: Dr. Para March

## 2023-04-08 ENCOUNTER — Emergency Department (HOSPITAL_COMMUNITY): Payer: Medicare Other

## 2023-04-08 ENCOUNTER — Inpatient Hospital Stay (HOSPITAL_COMMUNITY)
Admission: EM | Admit: 2023-04-08 | Discharge: 2023-04-17 | DRG: 291 | Disposition: A | Discharge status: Home Health | Payer: Medicare Other | Attending: Family Medicine | Admitting: Family Medicine

## 2023-04-08 ENCOUNTER — Other Ambulatory Visit: Payer: Self-pay

## 2023-04-08 ENCOUNTER — Encounter (HOSPITAL_COMMUNITY): Payer: Self-pay

## 2023-04-08 DIAGNOSIS — E861 Hypovolemia: Secondary | ICD-10-CM | POA: Diagnosis present

## 2023-04-08 DIAGNOSIS — E871 Hypo-osmolality and hyponatremia: Secondary | ICD-10-CM | POA: Diagnosis present

## 2023-04-08 DIAGNOSIS — I152 Hypertension secondary to endocrine disorders: Secondary | ICD-10-CM | POA: Diagnosis present

## 2023-04-08 DIAGNOSIS — N179 Acute kidney failure, unspecified: Secondary | ICD-10-CM | POA: Diagnosis not present

## 2023-04-08 DIAGNOSIS — E785 Hyperlipidemia, unspecified: Secondary | ICD-10-CM | POA: Diagnosis not present

## 2023-04-08 DIAGNOSIS — F419 Anxiety disorder, unspecified: Secondary | ICD-10-CM | POA: Diagnosis present

## 2023-04-08 DIAGNOSIS — R079 Chest pain, unspecified: Secondary | ICD-10-CM | POA: Diagnosis not present

## 2023-04-08 DIAGNOSIS — E1165 Type 2 diabetes mellitus with hyperglycemia: Secondary | ICD-10-CM | POA: Diagnosis present

## 2023-04-08 DIAGNOSIS — E860 Dehydration: Secondary | ICD-10-CM | POA: Diagnosis present

## 2023-04-08 DIAGNOSIS — I472 Ventricular tachycardia, unspecified: Secondary | ICD-10-CM | POA: Diagnosis not present

## 2023-04-08 DIAGNOSIS — I2489 Other forms of acute ischemic heart disease: Secondary | ICD-10-CM | POA: Diagnosis present

## 2023-04-08 DIAGNOSIS — R1111 Vomiting without nausea: Secondary | ICD-10-CM | POA: Diagnosis not present

## 2023-04-08 DIAGNOSIS — Z743 Need for continuous supervision: Secondary | ICD-10-CM | POA: Diagnosis not present

## 2023-04-08 DIAGNOSIS — G8929 Other chronic pain: Secondary | ICD-10-CM | POA: Diagnosis present

## 2023-04-08 DIAGNOSIS — R569 Unspecified convulsions: Secondary | ICD-10-CM | POA: Diagnosis not present

## 2023-04-08 DIAGNOSIS — I110 Hypertensive heart disease with heart failure: Secondary | ICD-10-CM | POA: Diagnosis not present

## 2023-04-08 DIAGNOSIS — I10 Essential (primary) hypertension: Secondary | ICD-10-CM | POA: Diagnosis present

## 2023-04-08 DIAGNOSIS — R0689 Other abnormalities of breathing: Secondary | ICD-10-CM | POA: Diagnosis not present

## 2023-04-08 DIAGNOSIS — E1159 Type 2 diabetes mellitus with other circulatory complications: Secondary | ICD-10-CM | POA: Diagnosis present

## 2023-04-08 DIAGNOSIS — N138 Other obstructive and reflux uropathy: Secondary | ICD-10-CM | POA: Diagnosis present

## 2023-04-08 DIAGNOSIS — R531 Weakness: Secondary | ICD-10-CM | POA: Diagnosis not present

## 2023-04-08 DIAGNOSIS — R509 Fever, unspecified: Secondary | ICD-10-CM | POA: Diagnosis not present

## 2023-04-08 DIAGNOSIS — I491 Atrial premature depolarization: Secondary | ICD-10-CM | POA: Diagnosis present

## 2023-04-08 DIAGNOSIS — J9601 Acute respiratory failure with hypoxia: Secondary | ICD-10-CM | POA: Diagnosis not present

## 2023-04-08 DIAGNOSIS — R0902 Hypoxemia: Secondary | ICD-10-CM | POA: Diagnosis not present

## 2023-04-08 DIAGNOSIS — Z794 Long term (current) use of insulin: Secondary | ICD-10-CM

## 2023-04-08 DIAGNOSIS — I5023 Acute on chronic systolic (congestive) heart failure: Secondary | ICD-10-CM | POA: Diagnosis not present

## 2023-04-08 DIAGNOSIS — I2510 Atherosclerotic heart disease of native coronary artery without angina pectoris: Secondary | ICD-10-CM | POA: Diagnosis not present

## 2023-04-08 DIAGNOSIS — Z8249 Family history of ischemic heart disease and other diseases of the circulatory system: Secondary | ICD-10-CM

## 2023-04-08 DIAGNOSIS — N401 Enlarged prostate with lower urinary tract symptoms: Secondary | ICD-10-CM | POA: Diagnosis not present

## 2023-04-08 DIAGNOSIS — Z85820 Personal history of malignant melanoma of skin: Secondary | ICD-10-CM

## 2023-04-08 DIAGNOSIS — Z885 Allergy status to narcotic agent status: Secondary | ICD-10-CM

## 2023-04-08 DIAGNOSIS — Z7982 Long term (current) use of aspirin: Secondary | ICD-10-CM

## 2023-04-08 DIAGNOSIS — G9341 Metabolic encephalopathy: Secondary | ICD-10-CM | POA: Diagnosis not present

## 2023-04-08 DIAGNOSIS — E876 Hypokalemia: Secondary | ICD-10-CM | POA: Diagnosis not present

## 2023-04-08 DIAGNOSIS — R6889 Other general symptoms and signs: Secondary | ICD-10-CM | POA: Diagnosis not present

## 2023-04-08 DIAGNOSIS — Z634 Disappearance and death of family member: Secondary | ICD-10-CM

## 2023-04-08 DIAGNOSIS — I493 Ventricular premature depolarization: Secondary | ICD-10-CM | POA: Diagnosis present

## 2023-04-08 DIAGNOSIS — Z888 Allergy status to other drugs, medicaments and biological substances status: Secondary | ICD-10-CM

## 2023-04-08 DIAGNOSIS — Z860100 Personal history of colon polyps, unspecified: Secondary | ICD-10-CM

## 2023-04-08 DIAGNOSIS — Z6831 Body mass index (BMI) 31.0-31.9, adult: Secondary | ICD-10-CM

## 2023-04-08 DIAGNOSIS — R0989 Other specified symptoms and signs involving the circulatory and respiratory systems: Secondary | ICD-10-CM | POA: Diagnosis not present

## 2023-04-08 DIAGNOSIS — Z1152 Encounter for screening for COVID-19: Secondary | ICD-10-CM | POA: Diagnosis not present

## 2023-04-08 DIAGNOSIS — E66811 Obesity, class 1: Secondary | ICD-10-CM | POA: Diagnosis not present

## 2023-04-08 DIAGNOSIS — I428 Other cardiomyopathies: Secondary | ICD-10-CM | POA: Diagnosis not present

## 2023-04-08 DIAGNOSIS — J101 Influenza due to other identified influenza virus with other respiratory manifestations: Secondary | ICD-10-CM

## 2023-04-08 DIAGNOSIS — I509 Heart failure, unspecified: Secondary | ICD-10-CM

## 2023-04-08 DIAGNOSIS — R5383 Other fatigue: Secondary | ICD-10-CM | POA: Diagnosis not present

## 2023-04-08 DIAGNOSIS — R918 Other nonspecific abnormal finding of lung field: Secondary | ICD-10-CM | POA: Diagnosis not present

## 2023-04-08 DIAGNOSIS — Z7984 Long term (current) use of oral hypoglycemic drugs: Secondary | ICD-10-CM

## 2023-04-08 DIAGNOSIS — Z87442 Personal history of urinary calculi: Secondary | ICD-10-CM

## 2023-04-08 DIAGNOSIS — R197 Diarrhea, unspecified: Secondary | ICD-10-CM | POA: Diagnosis present

## 2023-04-08 DIAGNOSIS — Z79899 Other long term (current) drug therapy: Secondary | ICD-10-CM

## 2023-04-08 LAB — CBC WITH DIFFERENTIAL/PLATELET
Abs Immature Granulocytes: 0.04 10*3/uL (ref 0.00–0.07)
Basophils Absolute: 0.1 10*3/uL (ref 0.0–0.1)
Basophils Relative: 1 %
Eosinophils Absolute: 0.1 10*3/uL (ref 0.0–0.5)
Eosinophils Relative: 1 %
HCT: 36.7 % — ABNORMAL LOW (ref 39.0–52.0)
Hemoglobin: 12.2 g/dL — ABNORMAL LOW (ref 13.0–17.0)
Immature Granulocytes: 0 %
Lymphocytes Relative: 14 %
Lymphs Abs: 1.3 10*3/uL (ref 0.7–4.0)
MCH: 30.1 pg (ref 26.0–34.0)
MCHC: 33.2 g/dL (ref 30.0–36.0)
MCV: 90.6 fL (ref 80.0–100.0)
Monocytes Absolute: 0.8 10*3/uL (ref 0.1–1.0)
Monocytes Relative: 9 %
Neutro Abs: 7.5 10*3/uL (ref 1.7–7.7)
Neutrophils Relative %: 75 %
Platelets: 236 10*3/uL (ref 150–400)
RBC: 4.05 MIL/uL — ABNORMAL LOW (ref 4.22–5.81)
RDW: 14.2 % (ref 11.5–15.5)
WBC: 9.8 10*3/uL (ref 4.0–10.5)
nRBC: 0 % (ref 0.0–0.2)

## 2023-04-08 LAB — COMPREHENSIVE METABOLIC PANEL
ALT: 15 U/L (ref 0–44)
AST: 14 U/L — ABNORMAL LOW (ref 15–41)
Albumin: 3.6 g/dL (ref 3.5–5.0)
Alkaline Phosphatase: 50 U/L (ref 38–126)
Anion gap: 11 (ref 5–15)
BUN: 19 mg/dL (ref 8–23)
CO2: 24 mmol/L (ref 22–32)
Calcium: 9.5 mg/dL (ref 8.9–10.3)
Chloride: 97 mmol/L — ABNORMAL LOW (ref 98–111)
Creatinine, Ser: 1.2 mg/dL (ref 0.61–1.24)
GFR, Estimated: 59 mL/min — ABNORMAL LOW (ref >60)
Glucose, Bld: 173 mg/dL — ABNORMAL HIGH (ref 70–99)
Potassium: 3.1 mmol/L — ABNORMAL LOW (ref 3.5–5.1)
Sodium: 132 mmol/L — ABNORMAL LOW (ref 135–145)
Total Bilirubin: 1 mg/dL (ref 0.0–1.2)
Total Protein: 6.4 g/dL — ABNORMAL LOW (ref 6.5–8.1)

## 2023-04-08 LAB — URINALYSIS, W/ REFLEX TO CULTURE (INFECTION SUSPECTED)
Bilirubin Urine: NEGATIVE
Glucose, UA: NEGATIVE mg/dL
Hgb urine dipstick: NEGATIVE
Ketones, ur: NEGATIVE mg/dL
Leukocytes,Ua: NEGATIVE
Nitrite: NEGATIVE
Protein, ur: 100 mg/dL — AB
Specific Gravity, Urine: 1.015 (ref 1.005–1.030)
pH: 5 (ref 5.0–8.0)

## 2023-04-08 LAB — RESP PANEL BY RT-PCR (RSV, FLU A&B, COVID)  RVPGX2
Influenza A by PCR: POSITIVE — AB
Influenza B by PCR: NEGATIVE
Resp Syncytial Virus by PCR: NEGATIVE
SARS Coronavirus 2 by RT PCR: NEGATIVE

## 2023-04-08 LAB — BRAIN NATRIURETIC PEPTIDE: B Natriuretic Peptide: 618.2 pg/mL — ABNORMAL HIGH (ref 0.0–100.0)

## 2023-04-08 LAB — PROTIME-INR
INR: 1.1 (ref 0.8–1.2)
Prothrombin Time: 14.7 s (ref 11.4–15.2)

## 2023-04-08 LAB — I-STAT CG4 LACTIC ACID, ED: Lactic Acid, Venous: 0.7 mmol/L (ref 0.5–1.9)

## 2023-04-08 LAB — TROPONIN I (HIGH SENSITIVITY): Troponin I (High Sensitivity): 32 ng/L — ABNORMAL HIGH (ref <18)

## 2023-04-08 LAB — APTT: aPTT: 31 s (ref 24–36)

## 2023-04-08 MED ORDER — IPRATROPIUM-ALBUTEROL 0.5-2.5 (3) MG/3ML IN SOLN
3.0000 mL | Freq: Once | RESPIRATORY_TRACT | Status: AC
  Administered 2023-04-08: 3 mL via RESPIRATORY_TRACT
  Filled 2023-04-08: qty 3

## 2023-04-08 MED ORDER — OSELTAMIVIR PHOSPHATE 75 MG PO CAPS
75.0000 mg | ORAL_CAPSULE | Freq: Once | ORAL | Status: AC
  Administered 2023-04-09: 75 mg via ORAL
  Filled 2023-04-08: qty 1

## 2023-04-08 NOTE — H&P (Signed)
 History and Physical    [name] ZOX:[id number] DOB: May 08, 1934 DOA: 04/08/2023  PCP: Joaquim Nam, MD  Patient coming from: ***  I have personally briefly reviewed patient's old medical records in [facility]  Chief Complaint: ***  HPI: [name] is a 89 y.o. male with medical history significant for HFmrEF, CAD, insulin-dependent T2DM, HTN, HLD, PNES, BPH, anxiety who presented to the ED for evaluation of shortness of breath and cough.  ***  ED Course  Labs/Imaging on admission: I have personally reviewed following labs and imaging studies.  ***  Review of Systems: All systems reviewed and are negative except as documented in history of present illness above.   Past Medical History:  Diagnosis Date  . Anxiety   . Borderline hyperlipidemia   . CAD (coronary artery disease)   . Colon polyps   . Complication of anesthesia    trouble urinating after anesthesia  . Diverticulosis of colon   . DJD (degenerative joint disease)   . DM (diabetes mellitus) (HCC)    Adult onset  type 2  . History of gastritis   . History of kidney stones    surgery to remove  . History of pyelonephritis   . History of syncope   . HTN (hypertension)   . Hypertrophy of prostate with urinary obstruction and other lower urinary tract symptoms (LUTS)   . Melanoma (HCC)    back of neck  . Melanoma (HCC) 2022   per dermatology  . Myoclonus   . Pancreatitis due to biliary obstruction several years ago   treated by Dr. Lina Sar    Past Surgical History:  Procedure Laterality Date  . APPENDECTOMY    . cartarized  nose blood vessel     left side  . CATARACT EXTRACTION     RIGHT EYE  . CHOLECYSTECTOMY    . cystoscopy  04/17/11   stent placed  . CYSTOSCOPY W/ URETERAL STENT PLACEMENT  04/17/2011   Procedure: CYSTOSCOPY WITH RETROGRADE PYELOGRAM/URETERAL STENT PLACEMENT;  Surgeon: Lindaann Slough, MD;  Location: [facility];  Service: Urology;  Laterality: Left;  . CYSTOSCOPY  WITH RETROGRADE PYELOGRAM, URETEROSCOPY AND STENT PLACEMENT Left 04/18/2013   Procedure: CYSTOSCOPY WITH LEFT RETROGRADE PYELOGRAM, LEFT URETEROSCOPY AND LASER LITHOTRIPSY LEFT STENT PLACEMENT;  Surgeon: Crecencio Mc, MD;  Location: [facility];  Service: Urology;  Laterality: Left;  . HOLMIUM LASER APPLICATION Left 04/18/2013   Procedure: HOLMIUM LASER APPLICATION;  Surgeon: Crecencio Mc, MD;  Location: [facility];  Service: Urology;  Laterality: Left;  . INCISION AND DRAINAGE ABSCESS Left 04/04/2020   Procedure: Angelina Pih OF LEFT ARM HEMATOMA;  Surgeon: Almond Lint, MD;  Location: [facility];  Service: General;  Laterality: Left;  left  . IRRIGATION AND DEBRIDEMENT ABSCESS Left 10/05/2014   Procedure: IAND D LEFT INDEX FINGER;  Surgeon: Knute Neu, MD;  Location: [facility];  Service: Plastics;  Laterality: Left;  . KNEE SURGERY     bilateral ARTHROSCOPY X2 TO EACH KNEE  . MELANOMA EXCISION  2018  . MELANOMA EXCISION WITH SENTINEL LYMPH NODE BIOPSY Left 03/29/2020   Procedure: WIDE LOCAL EXCISION, ADVANCED FLAP CLOSURE LEFT ARM MELANOMA DEFECT WITH SENTINEL LYMPH NODE MAPPING AND BIOPSY;  Surgeon: Almond Lint, MD;  Location: [facility];  Service: General;  Laterality: Left;  . S/P cysto & stents for kidnedy stone 11/08 by Dr. Wanda Plump    . 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  SHOULDER SURGERY  04/2005   left by Dr. Luiz Blare    Social History:  reports that he has never smoked. He has never used smokeless tobacco. He reports that he does not drink alcohol and does not use drugs.  Allergies  Allergen Reactions  . Morphine Nausea Only, Other (See Comments) and Nausea And Vomiting    Can take with food and usually doesn't cause nausea  . Atorvastatin     Intolerant - nausea/myalgias  . Codeine Phosphate Nausea And Vomiting  . Metformin And Related Diarrhea    Family History  Problem Relation Age of Onset  . Heart attack Mother   . Dementia  Brother   . Colon cancer Neg Hx   . Prostate cancer Neg Hx      Prior to Admission medications   Medication Sig Start Date End Date Taking? Authorizing Provider  Accu-Chek Softclix Lancets lancets TEST BLOOD SUGAR UP TO THREE TIMES DAILY 01/23/20   Joaquim Nam, MD  amLODipine (NORVASC) 10 MG tablet TAKE 1 TABLET BY MOUTH IN THE  MORNING 03/10/23   Joaquim Nam, MD  aspirin EC 81 MG tablet Take 81 mg by mouth every evening.    [provider]  Blood Glucose Calibration (ACCU-CHEK AVIVA) SOLN Use as directed monthly. E11.59  Insulin dependent 12/06/18   Joaquim Nam, MD  Blood Glucose Monitoring Suppl (ACCU-CHEK AVIVA PLUS) w/Device KIT Check sugar up to 3 times daily. E11.59  Insulin dependent 11/30/18   Joaquim Nam, MD  carvedilol (COREG) 12.5 MG tablet TAKE 1 TABLET BY MOUTH TWICE  DAILY WITH MEALS 03/10/23   Joaquim Nam, MD  COD LIVER OIL PO Take 1 capsule by mouth 2 (two) times daily.     [provider]  Continuous Glucose Receiver (FREESTYLE LIBRE 3 READER) DEVI 1 each by Does not apply route as directed. 05/28/22   Joaquim Nam, MD  Continuous Glucose Sensor (FREESTYLE LIBRE 3 SENSOR) MISC Place 1 sensor on the skin every 14 days. Use to check glucose continuously 05/28/22   Joaquim Nam, MD  cyanocobalamin (,VITAMIN B-12,) 1000 MCG/ML injection Inject 1 mL (1,000 mcg total) into the muscle every 30 (thirty) days. Dispense 3 of the 1 mL vials. 10/29/18   Joaquim Nam, MD  diphenhydrAMINE HCl (BENADRYL ALLERGY PO) Take 1 each by mouth at bedtime. Liquid Gels    [provider]  DROPLET PEN NEEDLES 30G X 8 MM MISC USE  WITH  INSULIN  PEN 02/03/19   Joaquim Nam, MD  escitalopram (LEXAPRO) 20 MG tablet Take 1 tablet (20 mg total) by mouth daily. 03/25/23   Joaquim Nam, MD  glucose blood (ONETOUCH ULTRA) test strip USE TO CHECK UP TO 3 TIMES DAILY AS NEEDED 09/01/22   Joaquim Nam, MD  hydrALAZINE (APRESOLINE) 50 MG tablet  TAKE 1 TABLET BY MOUTH 3 TIMES  DAILY 03/10/23   Joaquim Nam, MD  insulin glargine, 1 Unit Dial, ([name] SOLOSTAR) 300 UNIT/ML Solostar Pen INJECT SUBCUTANEOUSLY 16  UNITS AT BEDTIME 01/02/23   Joaquim Nam, MD  insulin lispro (HUMALOG KWIKPEN) 100 UNIT/ML KwikPen INJECT SUBCUTANEOUSLY 4-10 UNITS 3 TIMES DAILY BEFORE MEALS PER  SLIDING SCALE: 151-200 = 4 UN,  201-250 = 6 UN, 251-300 = 8 UN,  AND OVER 300 = 10 UN 01/02/23   Joaquim Nam, MD  lisinopril-hydrochlorothiazide (ZESTORETIC) 20-25 MG tablet TAKE 1 TABLET BY MOUTH IN THE  MORNING 03/10/23   Crawford Givens  S, MD  meclizine (ANTIVERT) 12.5 MG tablet Take 1 tablet (12.5 mg total) by mouth 3 (three) times daily as needed for dizziness. 03/17/23   Joaquim Nam, MD  naproxen sodium (ALEVE) 220 MG tablet Take 220 mg by mouth at bedtime.    [provider]  ondansetron (ZOFRAN) 4 MG tablet Take 1 tablet (4 mg total) by mouth every 6 (six) hours. 03/06/23   Elayne Snare K, DO  potassium chloride SA (KLOR-CON M) 20 MEQ tablet TAKE 1 TABLET BY MOUTH DAILY 03/10/23   Joaquim Nam, MD  SYRINGE-NEEDLE, DISP, 3 ML (B-D 3CC LUER-LOK SYR 25GX1") 25G X 1" 3 ML MISC USE TO INJECT VITAMIN B12 MONTHLY. 08/22/19   Joaquim Nam, MD  tamsulosin (FLOMAX) 0.4 MG CAPS capsule TAKE 1 CAPSULE BY MOUTH DAILY  AFTER SUPPER 12/05/22   Joaquim Nam, MD  traMADol (ULTRAM) 50 MG tablet TAKE 1 TABLET BY MOUTH EVERY 12 HOURS AS NEEDED FOR KNEE PAIN. 11/19/22   Joaquim Nam, MD    Physical Exam: Vitals:   04/08/23 2201 04/08/23 2205  BP: 131/78   Pulse: 95   Resp: (!) 22   Temp: 100.1 F (37.8 C)   TempSrc: Oral   SpO2: 91%   Weight:  102.2 kg  Height:  6' (1.829 m)   *** Constitutional: NAD, calm, comfortable Eyes: PERRL, lids and conjunctivae normal ENMT: Mucous membranes are moist. Posterior pharynx clear of any exudate or lesions.Normal dentition.  Neck: normal, supple, no masses. Respiratory: clear to auscultation  bilaterally, no wheezing, no crackles. Normal respiratory effort. No accessory muscle use.  Cardiovascular: Regular rate and rhythm, no murmurs / rubs / gallops. No extremity edema. 2+ pedal pulses. Abdomen: no tenderness, no masses palpated. No hepatosplenomegaly. Bowel sounds positive.  Musculoskeletal: no clubbing / cyanosis. No joint deformity upper and lower extremities. Good ROM, no contractures. Normal muscle tone.  Skin: no rashes, lesions, ulcers. No induration Neurologic: CN 2-12 grossly intact. Sensation intact. Strength 5/5 in all 4.  Psychiatric: Normal judgment and insight. Alert and oriented x 3. Normal mood.   EKG: Personally reviewed. Sinus rhythm with frequent PACs.  Assessment/Plan Active Problems:   * No active hospital problems. *   *** No notes on file *** Assessment and Plan: Acute on chronic HFmrEF: ***  Acute respiratory failure with hypoxia: ***  Influenza A: ***  Hypokalemia: ***  Insulin-dependent type 2 diabetes: ***  Hypertension: ***  CAD: ***  Anxiety: ***  BPH: ***    DVT prophylaxis: ***  Code Status: ***  Family Communication: ***  Disposition Plan: ***  Consults called: ***  Severity of Illness: {Observation/Inpatient:21159}  Darreld Mclean MD [facility]  If 7PM-7AM, please contact night-coverage [email or web address]  04/08/2023, 11:57 PM  Personally reviewed. Sinus rhythm with frequent  PACs.  Assessment/Plan Principal Problem:   Acute on chronic heart failure with mildly reduced ejection fraction (HFmrEF, 41-49%) (HCC) Active Problems:   Acute respiratory failure with hypoxia (HCC)   Anxiety   Essential hypertension   CAD (coronary artery disease)   BPH with obstruction/lower urinary tract symptoms   Type 2 diabetes mellitus with vascular disease (HCC)   Hypokalemia   Influenza A   [name] is a 89 y.o. male with medical history significant for HFmrEF, CAD, insulin-dependent T2DM, HTN, HLD, PNES, BPH, anxiety who is admitted with acute hypoxic respiratory failure due to acute on chronic CHF and influenza A.  Assessment and Plan: Acute on chronic HFmrEF: Presenting with DOE, pulmonary edema on CXR, BNP 618.2.  Last TTE 03/2020 showed EF 40-45%, severe hypokinesis of base/mid inferoseptal wall. -Start IV Lasix 40 mg twice daily -Supplement potassium -Continue Coreg 12.5 mg twice daily -Continue lisinopril 20 mg daily -Update echocardiogram -Strict I/O/daily weights  Acute respiratory failure with hypoxia: Secondary to acute on chronic CHF and influenza A.  SpO2 88% on room air, stable on 4-5 L O2 via [place] on admission.  Continue diuresis and wean supplemental oxygen as able.  Influenza A: Continue Tamiflu.  Hypokalemia: Supplementing.  Holding HCTZ.  Insulin-dependent type 2 diabetes: Placed on SSI and Semglee 8 units nightly.  Hypertension: -Continue Coreg 12.5 mg twice daily -Continue lisinopril 20 mg daily -Continue amlodipine 10 mg daily -Holding HCTZ and hydralazine for now  CAD: Stable, denies chest pain.  Mild troponin elevation likely type II demand ischemia in setting of acute hypoxic respiratory failure and acute on chronic CHF. -Continue aspirin and Coreg -Not on statin due to history of myopathy  Anxiety: Continue Lexapro.  BPH: Continue Flomax.  Chronic pain: Continue home tramadol 50 mg twice daily as needed.   DVT  prophylaxis: enoxaparin (LOVENOX) injection 40 mg Start: 04/09/23 1000 Code Status: Full code, discussed with patient on admission. Family Communication: Discussed with patient, he has discussed with family Disposition Plan: From home, dispo pending clinical progress Consults called: None Severity of Illness: The appropriate patient status for this patient is INPATIENT. Inpatient status is judged to be reasonable and necessary in order to provide the required intensity of service to ensure the patient's safety. The patient's presenting symptoms, physical exam findings, and initial radiographic and laboratory data in the context of their chronic comorbidities is felt to place them at high risk for further clinical deterioration. Furthermore, it is not anticipated that the patient will be medically stable for discharge from the hospital within 2 midnights of admission.   * I certify that at the point of admission it is my clinical judgment that the patient will require inpatient hospital care spanning beyond 2 midnights from the point of admission due to high intensity of service, high risk for further deterioration and high frequency of surveillance required.Darreld Mclean MD [facility]  If 7PM-7AM, please contact night-coverage [email or web address]  04/09/2023, 1:19 AM

## 2023-04-08 NOTE — ED Triage Notes (Addendum)
 Pt BIBA c/o flu like symptoms, Also c/o red tinged sputum, generalized weakness, and nausea. O2 on RA 88% EMS put him on 3L and now at [address] [place].  Was exposed to sick family. Hx of CHF.   Bp 170/100 CBG 186

## 2023-04-08 NOTE — ED Provider Notes (Signed)
 [facility] Emergency Department Provider Note MRN:  [id number]  Arrival date & time: 04/09/23     Chief Complaint   Weakness and Cough   History of Present Illness   [name] is a 89 y.o. year-old male presents to the ED with chief complaint of shortness of breath.  Onset was today.  He states that he has had some cough.  Noted to have low-grade fever in triage of 100.1.  He denies heart problems, but medical history lists CHF.  He states that he had some nausea and vomiting earlier today.  He denies choking or aspirating on any of his vomit. He states that he felt very lightheaded earlier. Denies being in any pain.  Denies any other associated symptoms.  Patient does not wear oxygen at baseline.  When EMS picked him up he was hypoxic and was placed on 3 L nasal cannula.  During triage, patient remained at 88% while on 3 L.  RN increased to 5 L nasal cannula.    History provided by patient.   Review of Systems  Pertinent positive and negative review of systems noted in HPI.    Physical Exam   Vitals:   04/08/23 2201  BP: 131/78  Pulse: 95  Resp: (!) 22  Temp: 100.1 F (37.8 C)  SpO2: 91%    CONSTITUTIONAL: Short of breath-appearing, NAD NEURO:  Alert and oriented x 3, CN 3-12 grossly intact EYES:  eyes equal and reactive ENT/NECK:  Supple, no stridor  CARDIO:  normal rate, regular rhythm, appears well-perfused  PULM: Mild respiratory distress, moderate increased WOB GI/GU:  non-distended,  MSK/SPINE:  No gross deformities, no edema, moves all extremities  SKIN:  no rash, atraumatic   *Additional and/or pertinent findings included in MDM below  Diagnostic and Interventional Summary    EKG Interpretation Date/Time:  Wednesday April 08 2023 23:08:46 EST Ventricular Rate:  91 PR Interval:  197 QRS Duration:  115 QT Interval:  364 QTC Calculation: 448 R Axis:   189  Text Interpretation: Unknown rhythm, irregular rate  Nonspecific intraventricular conduction delay Nonspecific T abnormalities, lateral leads No significant change since last tracing Confirmed by Elayne Snare ([id number]) on 04/08/2023 11:34:58 PM       Labs Reviewed  RESP PANEL BY RT-PCR (RSV, FLU A&B, COVID)  RVPGX2 - Abnormal; Notable for the following components:      Result Value   Influenza A by PCR POSITIVE (*)    All other components within normal limits  COMPREHENSIVE METABOLIC PANEL - Abnormal; Notable for the following components:   Sodium 132 (*)    Potassium 3.1 (*)    Chloride 97 (*)    Glucose, Bld 173 (*)    Total Protein 6.4 (*)    AST 14 (*)    GFR, Estimated 59 (*)    All other components within normal limits  CBC WITH DIFFERENTIAL/PLATELET - Abnormal; Notable for the following components:   RBC 4.05 (*)    Hemoglobin 12.2 (*)    HCT 36.7 (*)    All other components within normal limits  URINALYSIS, W/ REFLEX TO CULTURE (INFECTION SUSPECTED) - Abnormal; Notable for the following components:   Protein, ur 100 (*)    Bacteria, UA MANY (*)    All other components within normal limits  BRAIN NATRIURETIC PEPTIDE - Abnormal; Notable for the following components:   B Natriuretic Peptide 618.2 (*)    All other components within normal limits  TROPONIN I (HIGH SENSITIVITY) -  Abnormal; Notable for the following components:   Troponin I (High Sensitivity) 32 (*)    All other components within normal limits  CULTURE, BLOOD (ROUTINE X 2)  CULTURE, BLOOD (ROUTINE X 2)  PROTIME-INR  APTT  I-STAT CG4 LACTIC ACID, ED  TROPONIN I (HIGH SENSITIVITY)    DG Chest Port 1 View  Final Result      Medications  ipratropium-albuterol (DUONEB) 0.5-2.5 (3) MG/3ML nebulizer solution 3 mL (3 mLs Nebulization Given 04/08/23 2307)  oseltamivir (TAMIFLU) capsule 75 mg (75 mg Oral Given 04/09/23 0010)  potassium chloride SA (KLOR-CON M) CR tablet 40 mEq (40 mEq Oral Given 04/09/23 0012)     Procedures  /  Critical Care .Critical  Care  Performed by: Roxy Horseman, PA-C Authorized by: Roxy Horseman, PA-C   Critical care provider statement:    Critical care time (minutes):  48   Critical care was necessary to treat or prevent imminent or life-threatening deterioration of the following conditions:  Respiratory failure   Critical care was time spent personally by me on the following activities:  Development of treatment plan with patient or surrogate, discussions with consultants, evaluation of patient's response to treatment, examination of patient, ordering and review of laboratory studies, ordering and review of radiographic studies, ordering and performing treatments and interventions, pulse oximetry, re-evaluation of patient's condition and review of old charts   ED Course and Medical Decision Making  I have reviewed the triage vital signs, the nursing notes, and pertinent available records from the EMR.  Social Determinants Affecting Complexity of Care: Patient has no clinically significant social determinants affecting this chief complaint..   ED Course: Clinical Course as of 04/09/23 0023  Thu Apr 09, 2023  0021 Resp panel by RT-PCR (RSV, Flu A&B, Covid) Anterior Nasal Swab(!) Flu A positive, will start tamiflu. [RB]  0023 Brain natriuretic peptide(!) Elevated BNP, CXR also with CHF changes.  Will need admission. [RB]  0023 DG Chest Port 1 View Vascular congestion. [RB]    Clinical Course User Index [RB] Roxy Horseman, PA-C    Medical Decision Making Patient here with SOB.  Onset today.  He was noted to be hypoxic with EMS.  He does not wear oxygen at baseline.  He is on 5 L high flow nasal cannula now.  Will need admission.  Suspect CHF exacerbation in the setting of influenza A.  Amount and/or Complexity of Data Reviewed Labs: ordered. Decision-making details documented in ED Course. Radiology: ordered and independent interpretation performed. Decision-making details documented in ED  Course.  Risk Prescription drug management. Decision regarding hospitalization.         Consultants: I consulted with Hospitalist, Dr. Allena Katz, who is appreciated for admitting.   Treatment and Plan: Patient's exam and diagnostic results are concerning for CHF exacerbation, acute hypoxic respiratory failure and influenza A.  Feel that patient will need admission to the hospital for further treatment and evaluation.    Final Clinical Impressions(s) / ED Diagnoses     ICD-10-CM   1. Acute hypoxic respiratory failure (HCC)  J96.01     2. Acute on chronic congestive heart failure, unspecified heart failure type (HCC)  I50.9     3. Influenza A  J10.1       ED Discharge Orders     None         Discharge Instructions Discussed with and Provided to Patient:   Discharge Instructions   None      Damean, Poffenberger, PA-C 04/09/23 0024  Palumbo, April, MD 04/09/23 [phone]

## 2023-04-09 ENCOUNTER — Inpatient Hospital Stay (HOSPITAL_COMMUNITY): Payer: Medicare Other

## 2023-04-09 DIAGNOSIS — E1159 Type 2 diabetes mellitus with other circulatory complications: Secondary | ICD-10-CM | POA: Diagnosis not present

## 2023-04-09 DIAGNOSIS — I10 Essential (primary) hypertension: Secondary | ICD-10-CM | POA: Diagnosis not present

## 2023-04-09 DIAGNOSIS — N179 Acute kidney failure, unspecified: Secondary | ICD-10-CM | POA: Diagnosis not present

## 2023-04-09 DIAGNOSIS — J9601 Acute respiratory failure with hypoxia: Secondary | ICD-10-CM | POA: Diagnosis not present

## 2023-04-09 DIAGNOSIS — F419 Anxiety disorder, unspecified: Secondary | ICD-10-CM | POA: Diagnosis present

## 2023-04-09 DIAGNOSIS — Z1152 Encounter for screening for COVID-19: Secondary | ICD-10-CM | POA: Diagnosis not present

## 2023-04-09 DIAGNOSIS — N138 Other obstructive and reflux uropathy: Secondary | ICD-10-CM | POA: Diagnosis not present

## 2023-04-09 DIAGNOSIS — R079 Chest pain, unspecified: Secondary | ICD-10-CM | POA: Diagnosis not present

## 2023-04-09 DIAGNOSIS — I472 Ventricular tachycardia, unspecified: Secondary | ICD-10-CM | POA: Diagnosis not present

## 2023-04-09 DIAGNOSIS — I2489 Other forms of acute ischemic heart disease: Secondary | ICD-10-CM | POA: Diagnosis not present

## 2023-04-09 DIAGNOSIS — E876 Hypokalemia: Secondary | ICD-10-CM | POA: Diagnosis not present

## 2023-04-09 DIAGNOSIS — E66811 Obesity, class 1: Secondary | ICD-10-CM | POA: Diagnosis present

## 2023-04-09 DIAGNOSIS — I5023 Acute on chronic systolic (congestive) heart failure: Secondary | ICD-10-CM | POA: Diagnosis present

## 2023-04-09 DIAGNOSIS — E861 Hypovolemia: Secondary | ICD-10-CM | POA: Diagnosis not present

## 2023-04-09 DIAGNOSIS — J101 Influenza due to other identified influenza virus with other respiratory manifestations: Secondary | ICD-10-CM

## 2023-04-09 DIAGNOSIS — Z7984 Long term (current) use of oral hypoglycemic drugs: Secondary | ICD-10-CM | POA: Diagnosis not present

## 2023-04-09 DIAGNOSIS — E1165 Type 2 diabetes mellitus with hyperglycemia: Secondary | ICD-10-CM | POA: Diagnosis not present

## 2023-04-09 DIAGNOSIS — I428 Other cardiomyopathies: Secondary | ICD-10-CM | POA: Diagnosis not present

## 2023-04-09 DIAGNOSIS — N401 Enlarged prostate with lower urinary tract symptoms: Secondary | ICD-10-CM | POA: Diagnosis present

## 2023-04-09 DIAGNOSIS — R569 Unspecified convulsions: Secondary | ICD-10-CM | POA: Diagnosis not present

## 2023-04-09 DIAGNOSIS — E785 Hyperlipidemia, unspecified: Secondary | ICD-10-CM | POA: Diagnosis present

## 2023-04-09 DIAGNOSIS — Z794 Long term (current) use of insulin: Secondary | ICD-10-CM | POA: Diagnosis not present

## 2023-04-09 DIAGNOSIS — I2510 Atherosclerotic heart disease of native coronary artery without angina pectoris: Secondary | ICD-10-CM | POA: Diagnosis not present

## 2023-04-09 DIAGNOSIS — E871 Hypo-osmolality and hyponatremia: Secondary | ICD-10-CM | POA: Diagnosis not present

## 2023-04-09 DIAGNOSIS — E860 Dehydration: Secondary | ICD-10-CM | POA: Diagnosis not present

## 2023-04-09 DIAGNOSIS — I110 Hypertensive heart disease with heart failure: Secondary | ICD-10-CM | POA: Diagnosis not present

## 2023-04-09 DIAGNOSIS — Z7982 Long term (current) use of aspirin: Secondary | ICD-10-CM | POA: Diagnosis not present

## 2023-04-09 DIAGNOSIS — G9341 Metabolic encephalopathy: Secondary | ICD-10-CM | POA: Diagnosis not present

## 2023-04-09 LAB — GLUCOSE, CAPILLARY
Glucose-Capillary: 231 mg/dL — ABNORMAL HIGH (ref 70–99)
Glucose-Capillary: 363 mg/dL — ABNORMAL HIGH (ref 70–99)

## 2023-04-09 LAB — CBC
HCT: 40.2 % (ref 39.0–52.0)
Hemoglobin: 13.2 g/dL (ref 13.0–17.0)
MCH: 30 pg (ref 26.0–34.0)
MCHC: 32.8 g/dL (ref 30.0–36.0)
MCV: 91.4 fL (ref 80.0–100.0)
Platelets: 263 10*3/uL (ref 150–400)
RBC: 4.4 MIL/uL (ref 4.22–5.81)
RDW: 14.2 % (ref 11.5–15.5)
WBC: 8.6 10*3/uL (ref 4.0–10.5)
nRBC: 0 % (ref 0.0–0.2)

## 2023-04-09 LAB — ECHOCARDIOGRAM COMPLETE
AR max vel: 1.59 cm2
AV Area VTI: 1.38 cm2
AV Area mean vel: 1.41 cm2
AV Mean grad: 3 mm[Hg]
AV Peak grad: 6.6 mm[Hg]
Ao pk vel: 1.28 m/s
Area-P 1/2: 2.91 cm2
Calc EF: 37.7 %
Height: 72 in
S' Lateral: 5.3 cm
Single Plane A2C EF: 36.1 %
Single Plane A4C EF: 41.1 %
Weight: 3603.2 [oz_av]

## 2023-04-09 LAB — CBG MONITORING, ED
Glucose-Capillary: 192 mg/dL — ABNORMAL HIGH (ref 70–99)
Glucose-Capillary: 167 mg/dL — ABNORMAL HIGH (ref 70–99)
Glucose-Capillary: 207 mg/dL — ABNORMAL HIGH (ref 70–99)

## 2023-04-09 LAB — BASIC METABOLIC PANEL
Anion gap: 10 (ref 5–15)
BUN: 19 mg/dL (ref 8–23)
CO2: 25 mmol/L (ref 22–32)
Calcium: 9.3 mg/dL (ref 8.9–10.3)
Chloride: 97 mmol/L — ABNORMAL LOW (ref 98–111)
Creatinine, Ser: 1.18 mg/dL (ref 0.61–1.24)
GFR, Estimated: >60 mL/min (ref >60)
Glucose, Bld: 213 mg/dL — ABNORMAL HIGH (ref 70–99)
Potassium: 3.5 mmol/L (ref 3.5–5.1)
Sodium: 132 mmol/L — ABNORMAL LOW (ref 135–145)

## 2023-04-09 LAB — TROPONIN I (HIGH SENSITIVITY): Troponin I (High Sensitivity): 37 ng/L — ABNORMAL HIGH (ref <18)

## 2023-04-09 LAB — MAGNESIUM: Magnesium: 1.8 mg/dL (ref 1.7–2.4)

## 2023-04-09 MED ORDER — CARVEDILOL 12.5 MG PO TABS
12.5000 mg | ORAL_TABLET | Freq: Two times a day (BID) | ORAL | Status: DC
  Administered 2023-04-09 – 2023-04-17 (×17): 12.5 mg via ORAL
  Filled 2023-04-09 (×17): qty 1

## 2023-04-09 MED ORDER — ONDANSETRON HCL 4 MG/2ML IJ SOLN
4.0000 mg | Freq: Four times a day (QID) | INTRAMUSCULAR | Status: DC | PRN
  Administered 2023-04-13 – 2023-04-14 (×4): 4 mg via INTRAVENOUS
  Filled 2023-04-09 (×5): qty 2

## 2023-04-09 MED ORDER — METHYLPREDNISOLONE SODIUM SUCC 40 MG IJ SOLR
40.0000 mg | Freq: Two times a day (BID) | INTRAMUSCULAR | Status: AC
Start: 2023-04-09 — End: 2023-04-10
  Administered 2023-04-09 – 2023-04-10 (×2): 40 mg via INTRAVENOUS
  Filled 2023-04-09 (×2): qty 1

## 2023-04-09 MED ORDER — PREDNISONE 20 MG PO TABS
40.0000 mg | ORAL_TABLET | Freq: Every day | ORAL | Status: DC
  Administered 2023-04-10 – 2023-04-11 (×2): 40 mg via ORAL
  Filled 2023-04-09 (×2): qty 2

## 2023-04-09 MED ORDER — TAMSULOSIN HCL 0.4 MG PO CAPS
0.4000 mg | ORAL_CAPSULE | Freq: Every day | ORAL | Status: DC
  Administered 2023-04-09 – 2023-04-16 (×8): 0.4 mg via ORAL
  Filled 2023-04-09 (×8): qty 1

## 2023-04-09 MED ORDER — FUROSEMIDE 10 MG/ML IJ SOLN
40.0000 mg | Freq: Two times a day (BID) | INTRAMUSCULAR | Status: DC
  Administered 2023-04-09 – 2023-04-10 (×3): 40 mg via INTRAVENOUS
  Filled 2023-04-09 (×3): qty 4

## 2023-04-09 MED ORDER — OSELTAMIVIR PHOSPHATE 75 MG PO CAPS: 75.0000 mg | ORAL_CAPSULE | Freq: Two times a day (BID) | ORAL | Status: DC

## 2023-04-09 MED ORDER — LISINOPRIL 20 MG PO TABS
20.0000 mg | ORAL_TABLET | Freq: Every day | ORAL | Status: DC
  Administered 2023-04-09: 20 mg via ORAL
  Filled 2023-04-09: qty 2

## 2023-04-09 MED ORDER — ASPIRIN 81 MG PO TBEC
81.0000 mg | DELAYED_RELEASE_TABLET | Freq: Every evening | ORAL | Status: DC
  Administered 2023-04-09 – 2023-04-16 (×8): 81 mg via ORAL
  Filled 2023-04-09 (×8): qty 1

## 2023-04-09 MED ORDER — IPRATROPIUM-ALBUTEROL 0.5-2.5 (3) MG/3ML IN SOLN: 3.0000 mL | Freq: Four times a day (QID) | RESPIRATORY_TRACT | Status: DC | PRN

## 2023-04-09 MED ORDER — INSULIN ASPART 100 UNIT/ML IJ SOLN
0.0000 [IU] | Freq: Three times a day (TID) | INTRAMUSCULAR | Status: DC
  Administered 2023-04-09 (×2): 3 [IU] via SUBCUTANEOUS
  Administered 2023-04-09: 2 [IU] via SUBCUTANEOUS
  Filled 2023-04-09: qty 0.09

## 2023-04-09 MED ORDER — ONDANSETRON HCL 4 MG PO TABS
4.0000 mg | ORAL_TABLET | Freq: Four times a day (QID) | ORAL | Status: DC | PRN
  Filled 2023-04-09: qty 1

## 2023-04-09 MED ORDER — HYDRALAZINE HCL 25 MG PO TABS: 50.0000 mg | ORAL_TABLET | Freq: Three times a day (TID) | ORAL | Status: DC

## 2023-04-09 MED ORDER — SODIUM CHLORIDE 0.9% FLUSH
3.0000 mL | Freq: Two times a day (BID) | INTRAVENOUS | Status: DC
  Administered 2023-04-09 – 2023-04-17 (×16): 3 mL via INTRAVENOUS

## 2023-04-09 MED ORDER — OSELTAMIVIR PHOSPHATE 30 MG PO CAPS
30.0000 mg | ORAL_CAPSULE | Freq: Two times a day (BID) | ORAL | Status: AC
  Administered 2023-04-09 – 2023-04-13 (×9): 30 mg via ORAL
  Filled 2023-04-09 (×9): qty 1

## 2023-04-09 MED ORDER — ACETAMINOPHEN 325 MG PO TABS: 650.0000 mg | ORAL_TABLET | Freq: Four times a day (QID) | ORAL | Status: DC | PRN

## 2023-04-09 MED ORDER — POTASSIUM CHLORIDE CRYS ER 20 MEQ PO TBCR
40.0000 meq | EXTENDED_RELEASE_TABLET | Freq: Once | ORAL | Status: AC
  Administered 2023-04-09: 40 meq via ORAL
  Filled 2023-04-09: qty 2

## 2023-04-09 MED ORDER — ENOXAPARIN SODIUM 40 MG/0.4ML IJ SOSY
40.0000 mg | PREFILLED_SYRINGE | INTRAMUSCULAR | Status: DC
  Administered 2023-04-09 – 2023-04-17 (×9): 40 mg via SUBCUTANEOUS
  Filled 2023-04-09 (×9): qty 0.4

## 2023-04-09 MED ORDER — GUAIFENESIN ER 600 MG PO TB12
600.0000 mg | ORAL_TABLET | Freq: Two times a day (BID) | ORAL | Status: DC | PRN
  Administered 2023-04-11 – 2023-04-16 (×3): 600 mg via ORAL
  Filled 2023-04-09 (×3): qty 1

## 2023-04-09 MED ORDER — TRAMADOL HCL 50 MG PO TABS
50.0000 mg | ORAL_TABLET | Freq: Two times a day (BID) | ORAL | Status: DC | PRN
  Administered 2023-04-09: 50 mg via ORAL
  Filled 2023-04-09: qty 1

## 2023-04-09 MED ORDER — SENNOSIDES-DOCUSATE SODIUM 8.6-50 MG PO TABS
1.0000 | ORAL_TABLET | Freq: Every evening | ORAL | Status: DC | PRN
  Administered 2023-04-10 – 2023-04-12 (×2): 1 via ORAL
  Filled 2023-04-09 (×2): qty 1

## 2023-04-09 MED ORDER — INSULIN ASPART 100 UNIT/ML IJ SOLN
0.0000 [IU] | Freq: Every day | INTRAMUSCULAR | Status: DC
  Administered 2023-04-09: 5 [IU] via SUBCUTANEOUS
  Administered 2023-04-10: 4 [IU] via SUBCUTANEOUS
  Administered 2023-04-11: 2 [IU] via SUBCUTANEOUS
  Administered 2023-04-12: 3 [IU] via SUBCUTANEOUS
  Administered 2023-04-13: 2 [IU] via SUBCUTANEOUS
  Filled 2023-04-09: qty 0.05

## 2023-04-09 MED ORDER — ACETAMINOPHEN 650 MG RE SUPP: 650.0000 mg | Freq: Four times a day (QID) | RECTAL | Status: DC | PRN

## 2023-04-09 MED ORDER — AMLODIPINE BESYLATE 10 MG PO TABS
10.0000 mg | ORAL_TABLET | Freq: Every morning | ORAL | Status: DC
  Administered 2023-04-09 – 2023-04-10 (×2): 10 mg via ORAL
  Filled 2023-04-09: qty 1
  Filled 2023-04-09: qty 2

## 2023-04-09 MED ORDER — ESCITALOPRAM OXALATE 20 MG PO TABS
20.0000 mg | ORAL_TABLET | Freq: Every day | ORAL | Status: DC
  Administered 2023-04-09 – 2023-04-17 (×9): 20 mg via ORAL
  Filled 2023-04-09 (×3): qty 1
  Filled 2023-04-09: qty 2
  Filled 2023-04-09 (×5): qty 1

## 2023-04-09 MED ORDER — POTASSIUM CHLORIDE CRYS ER 20 MEQ PO TBCR: 20.0000 meq | EXTENDED_RELEASE_TABLET | Freq: Every day | ORAL | Status: DC

## 2023-04-09 MED ORDER — INSULIN GLARGINE-YFGN 100 UNIT/ML ~~LOC~~ SOLN
8.0000 [IU] | Freq: Every day | SUBCUTANEOUS | Status: DC
  Administered 2023-04-09: 8 [IU] via SUBCUTANEOUS
  Filled 2023-04-09: qty 0.08

## 2023-04-09 NOTE — ED Notes (Signed)
 Pt had an occurrence of blood tinged sputum.

## 2023-04-09 NOTE — Progress Notes (Signed)
  Echocardiogram 2D Echocardiogram has been performed.  [name] [name] 04/09/2023, 1:09 PM

## 2023-04-09 NOTE — Plan of Care (Signed)
  Problem: Activity: Goal: Risk for activity intolerance will decrease Outcome: Progressing   Problem: Coping: Goal: Level of anxiety will decrease Outcome: Progressing   Problem: Pain Managment: Goal: General experience of comfort will improve and/or be controlled Outcome: Progressing   Problem: Safety: Goal: Ability to remain free from injury will improve Outcome: Progressing   Problem: Skin Integrity: Goal: Risk for impaired skin integrity will decrease Outcome: Progressing   Problem: Activity: Goal: Capacity to carry out activities will improve Outcome: Progressing

## 2023-04-09 NOTE — ED Notes (Signed)
 Ox reduced to 3L,  per admitting provider attempting to wean off oxygen.  Will monitor Ox sats for stability on 3L

## 2023-04-09 NOTE — ED Notes (Signed)
 Patient ambulated to bathroom with assistance of son.

## 2023-04-09 NOTE — H&P (Incomplete)
 History and Physical    [name] ZOX:[id number] DOB: May 08, 1934 DOA: 04/08/2023  PCP: Joaquim Nam, MD  Patient coming from: ***  I have personally briefly reviewed patient's old medical records in [facility]  Chief Complaint: ***  HPI: [name] is a 89 y.o. male with medical history significant for HFmrEF, CAD, insulin-dependent T2DM, HTN, HLD, PNES, BPH, anxiety who presented to the ED for evaluation of shortness of breath and cough.  ***  ED Course  Labs/Imaging on admission: I have personally reviewed following labs and imaging studies.  ***  Review of Systems: All systems reviewed and are negative except as documented in history of present illness above.   Past Medical History:  Diagnosis Date  . Anxiety   . Borderline hyperlipidemia   . CAD (coronary artery disease)   . Colon polyps   . Complication of anesthesia    trouble urinating after anesthesia  . Diverticulosis of colon   . DJD (degenerative joint disease)   . DM (diabetes mellitus) (HCC)    Adult onset  type 2  . History of gastritis   . History of kidney stones    surgery to remove  . History of pyelonephritis   . History of syncope   . HTN (hypertension)   . Hypertrophy of prostate with urinary obstruction and other lower urinary tract symptoms (LUTS)   . Melanoma (HCC)    back of neck  . Melanoma (HCC) 2022   per dermatology  . Myoclonus   . Pancreatitis due to biliary obstruction several years ago   treated by Dr. Lina Sar    Past Surgical History:  Procedure Laterality Date  . APPENDECTOMY    . cartarized  nose blood vessel     left side  . CATARACT EXTRACTION     RIGHT EYE  . CHOLECYSTECTOMY    . cystoscopy  04/17/11   stent placed  . CYSTOSCOPY W/ URETERAL STENT PLACEMENT  04/17/2011   Procedure: CYSTOSCOPY WITH RETROGRADE PYELOGRAM/URETERAL STENT PLACEMENT;  Surgeon: Lindaann Slough, MD;  Location: [facility];  Service: Urology;  Laterality: Left;  . CYSTOSCOPY  WITH RETROGRADE PYELOGRAM, URETEROSCOPY AND STENT PLACEMENT Left 04/18/2013   Procedure: CYSTOSCOPY WITH LEFT RETROGRADE PYELOGRAM, LEFT URETEROSCOPY AND LASER LITHOTRIPSY LEFT STENT PLACEMENT;  Surgeon: Crecencio Mc, MD;  Location: [facility];  Service: Urology;  Laterality: Left;  . HOLMIUM LASER APPLICATION Left 04/18/2013   Procedure: HOLMIUM LASER APPLICATION;  Surgeon: Crecencio Mc, MD;  Location: [facility];  Service: Urology;  Laterality: Left;  . INCISION AND DRAINAGE ABSCESS Left 04/04/2020   Procedure: Angelina Pih OF LEFT ARM HEMATOMA;  Surgeon: Almond Lint, MD;  Location: [facility];  Service: General;  Laterality: Left;  left  . IRRIGATION AND DEBRIDEMENT ABSCESS Left 10/05/2014   Procedure: IAND D LEFT INDEX FINGER;  Surgeon: Knute Neu, MD;  Location: [facility];  Service: Plastics;  Laterality: Left;  . KNEE SURGERY     bilateral ARTHROSCOPY X2 TO EACH KNEE  . MELANOMA EXCISION  2018  . MELANOMA EXCISION WITH SENTINEL LYMPH NODE BIOPSY Left 03/29/2020   Procedure: WIDE LOCAL EXCISION, ADVANCED FLAP CLOSURE LEFT ARM MELANOMA DEFECT WITH SENTINEL LYMPH NODE MAPPING AND BIOPSY;  Surgeon: Almond Lint, MD;  Location: [facility];  Service: General;  Laterality: Left;  . S/P cysto & stents for kidnedy stone 11/08 by Dr. Wanda Plump    . 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  SHOULDER SURGERY  04/2005   left by Dr. Luiz Blare    Social History:  reports that he has never smoked. He has never used smokeless tobacco. He reports that he does not drink alcohol and does not use drugs.  Allergies  Allergen Reactions  . Morphine Nausea Only, Other (See Comments) and Nausea And Vomiting    Can take with food and usually doesn't cause nausea  . Atorvastatin     Intolerant - nausea/myalgias  . Codeine Phosphate Nausea And Vomiting  . Metformin And Related Diarrhea    Family History  Problem Relation Age of Onset  . Heart attack Mother   . Dementia  Brother   . Colon cancer Neg Hx   . Prostate cancer Neg Hx      Prior to Admission medications   Medication Sig Start Date End Date Taking? Authorizing Provider  Accu-Chek Softclix Lancets lancets TEST BLOOD SUGAR UP TO THREE TIMES DAILY 01/23/20   Joaquim Nam, MD  amLODipine (NORVASC) 10 MG tablet TAKE 1 TABLET BY MOUTH IN THE  MORNING 03/10/23   Joaquim Nam, MD  aspirin EC 81 MG tablet Take 81 mg by mouth every evening.    [provider]  Blood Glucose Calibration (ACCU-CHEK AVIVA) SOLN Use as directed monthly. E11.59  Insulin dependent 12/06/18   Joaquim Nam, MD  Blood Glucose Monitoring Suppl (ACCU-CHEK AVIVA PLUS) w/Device KIT Check sugar up to 3 times daily. E11.59  Insulin dependent 11/30/18   Joaquim Nam, MD  carvedilol (COREG) 12.5 MG tablet TAKE 1 TABLET BY MOUTH TWICE  DAILY WITH MEALS 03/10/23   Joaquim Nam, MD  COD LIVER OIL PO Take 1 capsule by mouth 2 (two) times daily.     [provider]  Continuous Glucose Receiver (FREESTYLE LIBRE 3 READER) DEVI 1 each by Does not apply route as directed. 05/28/22   Joaquim Nam, MD  Continuous Glucose Sensor (FREESTYLE LIBRE 3 SENSOR) MISC Place 1 sensor on the skin every 14 days. Use to check glucose continuously 05/28/22   Joaquim Nam, MD  cyanocobalamin (,VITAMIN B-12,) 1000 MCG/ML injection Inject 1 mL (1,000 mcg total) into the muscle every 30 (thirty) days. Dispense 3 of the 1 mL vials. 10/29/18   Joaquim Nam, MD  diphenhydrAMINE HCl (BENADRYL ALLERGY PO) Take 1 each by mouth at bedtime. Liquid Gels    [provider]  DROPLET PEN NEEDLES 30G X 8 MM MISC USE  WITH  INSULIN  PEN 02/03/19   Joaquim Nam, MD  escitalopram (LEXAPRO) 20 MG tablet Take 1 tablet (20 mg total) by mouth daily. 03/25/23   Joaquim Nam, MD  glucose blood (ONETOUCH ULTRA) test strip USE TO CHECK UP TO 3 TIMES DAILY AS NEEDED 09/01/22   Joaquim Nam, MD  hydrALAZINE (APRESOLINE) 50 MG tablet  TAKE 1 TABLET BY MOUTH 3 TIMES  DAILY 03/10/23   Joaquim Nam, MD  insulin glargine, 1 Unit Dial, ([name] SOLOSTAR) 300 UNIT/ML Solostar Pen INJECT SUBCUTANEOUSLY 16  UNITS AT BEDTIME 01/02/23   Joaquim Nam, MD  insulin lispro (HUMALOG KWIKPEN) 100 UNIT/ML KwikPen INJECT SUBCUTANEOUSLY 4-10 UNITS 3 TIMES DAILY BEFORE MEALS PER  SLIDING SCALE: 151-200 = 4 UN,  201-250 = 6 UN, 251-300 = 8 UN,  AND OVER 300 = 10 UN 01/02/23   Joaquim Nam, MD  lisinopril-hydrochlorothiazide (ZESTORETIC) 20-25 MG tablet TAKE 1 TABLET BY MOUTH IN THE  MORNING 03/10/23   Crawford Givens  S, MD  meclizine (ANTIVERT) 12.5 MG tablet Take 1 tablet (12.5 mg total) by mouth 3 (three) times daily as needed for dizziness. 03/17/23   Joaquim Nam, MD  naproxen sodium (ALEVE) 220 MG tablet Take 220 mg by mouth at bedtime.    [provider]  ondansetron (ZOFRAN) 4 MG tablet Take 1 tablet (4 mg total) by mouth every 6 (six) hours. 03/06/23   Elayne Snare K, DO  potassium chloride SA (KLOR-CON M) 20 MEQ tablet TAKE 1 TABLET BY MOUTH DAILY 03/10/23   Joaquim Nam, MD  SYRINGE-NEEDLE, DISP, 3 ML (B-D 3CC LUER-LOK SYR 25GX1") 25G X 1" 3 ML MISC USE TO INJECT VITAMIN B12 MONTHLY. 08/22/19   Joaquim Nam, MD  tamsulosin (FLOMAX) 0.4 MG CAPS capsule TAKE 1 CAPSULE BY MOUTH DAILY  AFTER SUPPER 12/05/22   Joaquim Nam, MD  traMADol (ULTRAM) 50 MG tablet TAKE 1 TABLET BY MOUTH EVERY 12 HOURS AS NEEDED FOR KNEE PAIN. 11/19/22   Joaquim Nam, MD    Physical Exam: Vitals:   04/08/23 2201 04/08/23 2205  BP: 131/78   Pulse: 95   Resp: (!) 22   Temp: 100.1 F (37.8 C)   TempSrc: Oral   SpO2: 91%   Weight:  102.2 kg  Height:  6' (1.829 m)   *** Constitutional: NAD, calm, comfortable Eyes: PERRL, lids and conjunctivae normal ENMT: Mucous membranes are moist. Posterior pharynx clear of any exudate or lesions.Normal dentition.  Neck: normal, supple, no masses. Respiratory: clear to auscultation  bilaterally, no wheezing, no crackles. Normal respiratory effort. No accessory muscle use.  Cardiovascular: Regular rate and rhythm, no murmurs / rubs / gallops. No extremity edema. 2+ pedal pulses. Abdomen: no tenderness, no masses palpated. No hepatosplenomegaly. Bowel sounds positive.  Musculoskeletal: no clubbing / cyanosis. No joint deformity upper and lower extremities. Good ROM, no contractures. Normal muscle tone.  Skin: no rashes, lesions, ulcers. No induration Neurologic: CN 2-12 grossly intact. Sensation intact. Strength 5/5 in all 4.  Psychiatric: Normal judgment and insight. Alert and oriented x 3. Normal mood.   EKG: Personally reviewed. Sinus rhythm with frequent PACs.  Assessment/Plan Active Problems:   * No active hospital problems. *   *** No notes on file *** Assessment and Plan: Acute on chronic HFmrEF: ***  Acute respiratory failure with hypoxia: ***  Influenza A: ***  Hypokalemia: ***  Insulin-dependent type 2 diabetes: ***  Hypertension: ***  CAD: ***  Anxiety: ***  BPH: ***    DVT prophylaxis: ***  Code Status: ***  Family Communication: ***  Disposition Plan: ***  Consults called: ***  Severity of Illness: {Observation/Inpatient:21159}  Darreld Mclean MD [facility]  If 7PM-7AM, please contact night-coverage [email or web address]  04/08/2023, 11:57 PM

## 2023-04-09 NOTE — Progress Notes (Signed)
 PROGRESS NOTE  [name]  WRU:[id number] DOB: [birth year] DOA: 04/08/2023 PCP: Joaquim Nam, MD   Brief Narrative: Patient is a 89 year old male with history of HFmrEF, insulin-dependent diabetes type 2, hypertension, hyperlipidemia, BPH, anxiety who presented from home with complaint of shortness of breath, cough.  Reported dyspnea on minimal exertion, weakness, had subjective fever at home.  Does not use oxygen at home.  Lives with son and grand daughter-in-law.  EMS found him hypoxic in the range of 88% on room air.  On presentation, he had mild grade fever, required 4 L of oxygen per minute via nasal cannula.  Lab work showed potassium 3.1, creatinine 1.2, elevated BNP of 618.  Influenza A found to be positive.  Chest x-ray showed a large cardiac silhouette, increased pulmonary vasculature with patchy bilateral airspace disease.  Started on Lasix, Tamiflu.  Assessment & Plan:  Principal Problem:   Acute on chronic heart failure with mildly reduced ejection fraction (HFmrEF, 41-49%) (HCC) Active Problems:   Acute respiratory failure with hypoxia (HCC)   Anxiety   Essential hypertension   CAD (coronary artery disease)   BPH with obstruction/lower urinary tract symptoms   Type 2 diabetes mellitus with vascular disease (HCC)   Hypokalemia   Influenza A  Acute hypoxic respiratory failure: Does not use oxygen at home.  Hypoxic on arrival.  Currently on 4-5 L of oxygen.  Most likely from combination of acute on chronic CHF, influenza.  Continue to wean oxygen.  Acute on chronic HFmrEF: Presented with dyspnea on exertion, elevated BNP.  CXR showed  features of volume overload on chest x-ray.  Last TTE done on 2/22 showed EF of 40 to 45%.  Monitor input/output, daily weight, started on Lasix 40 mg twice daily.  Follow-up on echocardiogram.  Influenza A: Currently on Tamiflu.All family members has flu at home  Insulin-dependent diabetes 2: Continue current insulin regimen.  Monitor  blood sugars  Hypertension: Continue Coreg, lisinopril, amlodipine.  Home hydrochlorothiazide and hydrochlorothiazide on hold  History of coronary artery disease: No anginal symptoms.  Mild elevated troponin likely from demand ischemia from CHF.  Continue Coreg, aspirin.  Not on statin due to history of myopathy  Anxiety: On Lexapro  BPH: On Flomax  Weakness: Ambulates with the help of walker.  Lives with family.  Consult PT/OT        DVT prophylaxis:enoxaparin (LOVENOX) injection 40 mg Start: 04/09/23 1000     Code Status: Full Code  Family Communication: Called and discussed with son [name] Needle on phone on 2/27  Patient status:Inpatient  Patient is from :home  Anticipated discharge BJ:YNWG  Estimated DC date:2-3 days   Consultants: None  Procedures:None  Antimicrobials:  Anti-infectives (From admission, onward)    Start     Dose/Rate Route Frequency Ordered Stop   04/09/23 1000  oseltamivir (TAMIFLU) capsule 75 mg  Status:  Discontinued        75 mg Oral 2 times daily 04/09/23 0107 04/09/23 0111   04/09/23 1000  oseltamivir (TAMIFLU) capsule 30 mg        30 mg Oral 2 times daily 04/09/23 0111 04/13/23 2159   04/08/23 2345  oseltamivir (TAMIFLU) capsule 75 mg        75 mg Oral  Once 04/08/23 2338 04/09/23 0010       Subjective: Patient seen and examined at bedside today.  Hemodynamically stable.  On 4 to 5 L of high flow oxygen.  He says he feels better today.  Not in severe  respite distress.  Speaking in full sentences.  Looks weak and deconditioned.  Has lower extremity edema.  Objective: Vitals:   04/09/23 0430 04/09/23 0458 04/09/23 0553 04/09/23 0630  BP: (!) 146/62  (!) 153/70 (!) 151/71  Pulse: 77  79 (!) 49  Resp: 17  (!) 22 (!) 22  Temp:  98.9 F (37.2 C) (!) 97.5 F (36.4 C)   TempSrc:   Oral   SpO2: 96%  95% 96%  Weight:      Height:       No intake or output data in the 24 hours ending 04/09/23 0755 Filed Weights   04/08/23 2205  Weight:  102.2 kg    Examination:  General exam: Overall comfortable, not in distress HEENT: PERRL Respiratory system:  no wheezes or crackles  Cardiovascular system: S1 & S2 heard, RRR.  Gastrointestinal system: Abdomen is nondistended, soft and nontender. Central nervous system: Alert and oriented Extremities: No edema, no clubbing ,no cyanosis Skin: No rashes, no ulcers,no icterus     Data Reviewed: I have personally reviewed following labs and imaging studies  CBC: Recent Labs  Lab 04/08/23 2225 04/09/23 0532  WBC 9.8 8.6  NEUTROABS 7.5  --   HGB 12.2* 13.2  HCT 36.7* 40.2  MCV 90.6 91.4  PLT 236 263   Basic Metabolic Panel: Recent Labs  Lab 04/08/23 2225 04/09/23 0532  NA 132* 132*  K 3.1* 3.5  CL 97* 97*  CO2 24 25  GLUCOSE 173* 213*  BUN 19 19  CREATININE 1.20 1.18  CALCIUM 9.5 9.3  MG  --  1.8     Recent Results (from the past 240 hours)  Blood Culture (routine x 2)     Status: None (Preliminary result)   Collection Time: 04/08/23 10:27 PM   Specimen: BLOOD RIGHT ARM  Result Value Ref Range Status   Specimen Description   Final    BLOOD RIGHT ARM Performed at [facility], [address], [place], [place] [address]    Special Requests   Final    BOTTLES DRAWN AEROBIC AND ANAEROBIC Blood Culture adequate volume Performed at [facility], [address] [place], [place] [address]    Culture   Final    NO GROWTH < 12 HOURS Performed at [facility], [address], [place], [place] [address]    Report Status PENDING  Incomplete  Resp panel by RT-PCR (RSV, Flu A&B, Covid) Anterior Nasal Swab     Status: Abnormal   Collection Time: 04/08/23 10:50 PM   Specimen: Anterior Nasal Swab  Result Value Ref Range Status   SARS Coronavirus 2 by RT PCR NEGATIVE NEGATIVE Final    Comment: (NOTE) SARS-CoV-2 target nucleic acids are NOT DETECTED.  The SARS-CoV-2 RNA is generally detectable in upper respiratory specimens  during the acute phase of infection. The lowest concentration of SARS-CoV-2 viral copies this assay can detect is 138 copies/mL. A negative result does not preclude SARS-Cov-2 infection and should not be used as the sole basis for treatment or other patient management decisions. A negative result may occur with  improper specimen collection/handling, submission of specimen other than nasopharyngeal swab, presence of viral mutation(s) within the areas targeted by this assay, and inadequate number of viral copies(<138 copies/mL). A negative result must be combined with clinical observations, patient history, and epidemiological information. The expected result is Negative.  Fact Sheet for Patients:  BloggerCourse.com  Fact Sheet for Healthcare Providers:  SeriousBroker.it  This test is no t yet approved or cleared by the [place] and  has been authorized for detection and/or diagnosis of SARS-CoV-2 by FDA under an Emergency Use Authorization (EUA). This EUA will remain  in effect (meaning this test can be used) for the duration of the COVID-19 declaration under Section 564(b)(1) of the Act, 21 U.S.C.section 360bbb-3(b)(1), unless the authorization is terminated  or revoked sooner.       Influenza A by PCR POSITIVE (A) NEGATIVE Final   Influenza B by PCR NEGATIVE NEGATIVE Final    Comment: (NOTE) The Xpert Xpress SARS-CoV-2/FLU/RSV plus assay is intended as an aid in the diagnosis of influenza from Nasopharyngeal swab specimens and should not be used as a sole basis for treatment. Nasal washings and aspirates are unacceptable for Xpert Xpress SARS-CoV-2/FLU/RSV testing.  Fact Sheet for Patients: BloggerCourse.com  Fact Sheet for Healthcare Providers: SeriousBroker.it  This test is not yet approved or cleared by the [place] FDA and has been authorized for detection  and/or diagnosis of SARS-CoV-2 by FDA under an Emergency Use Authorization (EUA). This EUA will remain in effect (meaning this test can be used) for the duration of the COVID-19 declaration under Section 564(b)(1) of the Act, 21 U.S.C. section 360bbb-3(b)(1), unless the authorization is terminated or revoked.     Resp Syncytial Virus by PCR NEGATIVE NEGATIVE Final    Comment: (NOTE) Fact Sheet for Patients: BloggerCourse.com  Fact Sheet for Healthcare Providers: SeriousBroker.it  This test is not yet approved or cleared by the [place] FDA and has been authorized for detection and/or diagnosis of SARS-CoV-2 by FDA under an Emergency Use Authorization (EUA). This EUA will remain in effect (meaning this test can be used) for the duration of the COVID-19 declaration under Section 564(b)(1) of the Act, 21 U.S.C. section 360bbb-3(b)(1), unless the authorization is terminated or revoked.  Performed at [facility], [address] [place], [place] [address]      Radiology Studies: [facility] Chest Port 1 View Result Date: 04/08/2023 CLINICAL DATA:  Fever, weakness, hypoxia EXAM: PORTABLE CHEST 1 VIEW COMPARISON:  01/22/2023 FINDINGS: Single frontal view of the chest demonstrates a stable enlarged cardiac silhouette. There is increased pulmonary vascular congestion, with patchy bilateral airspace disease. No effusion or pneumothorax. No acute bony abnormalities. IMPRESSION: 1. Constellation of findings most consistent with congestive heart failure. Underlying pneumonia cannot be excluded. Electronically Signed   By: Sharlet Salina M.D.   On: 04/08/2023 22:19    Scheduled Meds:  amLODipine  10 mg Oral q morning   aspirin EC  81 mg Oral QPM   carvedilol  12.5 mg Oral BID WC   enoxaparin (LOVENOX) injection  40 mg Subcutaneous Q24H   escitalopram  20 mg Oral Daily   furosemide  40 mg Intravenous Q12H   insulin aspart  0-5  Units Subcutaneous QHS   insulin aspart  0-9 Units Subcutaneous TID WC   insulin glargine-yfgn  8 Units Subcutaneous QHS   lisinopril  20 mg Oral Daily   oseltamivir  30 mg Oral BID   potassium chloride  20 mEq Oral Daily   sodium chloride flush  3 mL Intravenous Q12H   tamsulosin  0.4 mg Oral QPC supper   Continuous Infusions:   LOS: 0 days   Burnadette Pop, MD [facility] P2/27/2025, 7:55 AM

## 2023-04-09 NOTE — ED Notes (Signed)
 Pt removed oxygen while eating,  pt ox sats dropped to high 80%  placed on 2L [place], ox sats rebounded to 95%

## 2023-04-09 NOTE — Hospital Course (Addendum)
 Mr. Youngberg was admitted to the hospital with the working diagnosis of acute on chronic systolic heart failure exacerbation in the setting of acute influenza infection.   89 year old male with history of heart failure, insulin-dependent diabetes type 2, hypertension, hyperlipidemia, BPH, anxiety who presented from home with complaint of shortness of breath, cough.  Reported dyspnea on minimal exertion, weakness, and subjective fever at home.  EMS found him hypoxic in the range of 88% on room air.  On presentation, he had mild grade fever, required 4 L of oxygen per minute via nasal cannula.   Lab work showed potassium 3.1, creatinine 1.2, elevated BNP of 618.  Influenza A found to be positive.   Chest x-ray showed a large cardiac silhouette, increased pulmonary vasculature with patchy bilateral airspace disease.   Patient was placed on furosemide for diuresis and Oseltamivir for influenza.   Echocardiogram with reduced LV systolic function.   03/01 recovering from heart failure and influenza.

## 2023-04-09 NOTE — ED Notes (Signed)
Bed linens changed

## 2023-04-10 ENCOUNTER — Telehealth: Payer: Self-pay | Admitting: Family Medicine

## 2023-04-10 ENCOUNTER — Inpatient Hospital Stay (HOSPITAL_COMMUNITY)
Admit: 2023-04-10 | Discharge: 2023-04-10 | Disposition: A | Discharge status: Home / Self Care | Payer: Medicare Other | Attending: Internal Medicine | Admitting: Internal Medicine

## 2023-04-10 DIAGNOSIS — R569 Unspecified convulsions: Secondary | ICD-10-CM | POA: Diagnosis not present

## 2023-04-10 DIAGNOSIS — I428 Other cardiomyopathies: Secondary | ICD-10-CM

## 2023-04-10 DIAGNOSIS — I5023 Acute on chronic systolic (congestive) heart failure: Secondary | ICD-10-CM | POA: Diagnosis not present

## 2023-04-10 DIAGNOSIS — I10 Essential (primary) hypertension: Secondary | ICD-10-CM | POA: Diagnosis not present

## 2023-04-10 LAB — BASIC METABOLIC PANEL
Anion gap: 12 (ref 5–15)
BUN: 28 mg/dL — ABNORMAL HIGH (ref 8–23)
CO2: 27 mmol/L (ref 22–32)
Calcium: 9.6 mg/dL (ref 8.9–10.3)
Chloride: 96 mmol/L — ABNORMAL LOW (ref 98–111)
Creatinine, Ser: 1.23 mg/dL (ref 0.61–1.24)
GFR, Estimated: 57 mL/min — ABNORMAL LOW (ref >60)
Glucose, Bld: 272 mg/dL — ABNORMAL HIGH (ref 70–99)
Potassium: 3.6 mmol/L (ref 3.5–5.1)
Sodium: 135 mmol/L (ref 135–145)

## 2023-04-10 LAB — CBC
HCT: 43.3 % (ref 39.0–52.0)
Hemoglobin: 13.6 g/dL (ref 13.0–17.0)
MCH: 29.4 pg (ref 26.0–34.0)
MCHC: 31.4 g/dL (ref 30.0–36.0)
MCV: 93.5 fL (ref 80.0–100.0)
Platelets: 266 10*3/uL (ref 150–400)
RBC: 4.63 MIL/uL (ref 4.22–5.81)
RDW: 14.1 % (ref 11.5–15.5)
WBC: 7.2 10*3/uL (ref 4.0–10.5)
nRBC: 0 % (ref 0.0–0.2)

## 2023-04-10 LAB — GLUCOSE, CAPILLARY
Glucose-Capillary: 274 mg/dL — ABNORMAL HIGH (ref 70–99)
Glucose-Capillary: 370 mg/dL — ABNORMAL HIGH (ref 70–99)
Glucose-Capillary: 330 mg/dL — ABNORMAL HIGH (ref 70–99)
Glucose-Capillary: 265 mg/dL — ABNORMAL HIGH (ref 70–99)
Glucose-Capillary: 334 mg/dL — ABNORMAL HIGH (ref 70–99)

## 2023-04-10 MED ORDER — HYDRALAZINE HCL 50 MG PO TABS
50.0000 mg | ORAL_TABLET | Freq: Three times a day (TID) | ORAL | Status: DC
Start: 2023-04-10 — End: 2023-04-17
  Administered 2023-04-10 – 2023-04-17 (×22): 50 mg via ORAL
  Filled 2023-04-10 (×22): qty 1

## 2023-04-10 MED ORDER — INSULIN GLARGINE-YFGN 100 UNIT/ML ~~LOC~~ SOLN
16.0000 [IU] | Freq: Every day | SUBCUTANEOUS | Status: DC
  Administered 2023-04-10 – 2023-04-11 (×2): 16 [IU] via SUBCUTANEOUS
  Filled 2023-04-10 (×2): qty 0.16

## 2023-04-10 MED ORDER — SPIRONOLACTONE 25 MG PO TABS: 25.0000 mg | ORAL_TABLET | Freq: Every day | ORAL | Status: DC

## 2023-04-10 MED ORDER — INSULIN ASPART 100 UNIT/ML IJ SOLN: 0.0000 [IU] | Freq: Every day | INTRAMUSCULAR | Status: DC

## 2023-04-10 MED ORDER — LORAZEPAM 2 MG/ML IJ SOLN: 2.0000 mg | Freq: Once | INTRAMUSCULAR | Status: DC

## 2023-04-10 MED ORDER — FUROSEMIDE 10 MG/ML IJ SOLN
40.0000 mg | Freq: Two times a day (BID) | INTRAMUSCULAR | Status: DC
  Administered 2023-04-10 – 2023-04-12 (×6): 40 mg via INTRAVENOUS
  Filled 2023-04-10 (×6): qty 4

## 2023-04-10 MED ORDER — LORAZEPAM 2 MG/ML IJ SOLN: 1.0000 mg | Freq: Once | INTRAMUSCULAR | Status: AC

## 2023-04-10 MED ORDER — LORAZEPAM 2 MG/ML IJ SOLN
INTRAMUSCULAR | Status: AC
  Administered 2023-04-10: 1 mg via INTRAVENOUS
  Filled 2023-04-10: qty 1

## 2023-04-10 MED ORDER — INSULIN ASPART 100 UNIT/ML IJ SOLN
0.0000 [IU] | Freq: Three times a day (TID) | INTRAMUSCULAR | Status: DC
  Administered 2023-04-10: 11 [IU] via SUBCUTANEOUS
  Administered 2023-04-10: 15 [IU] via SUBCUTANEOUS
  Administered 2023-04-10: 20 [IU] via SUBCUTANEOUS
  Administered 2023-04-11 (×3): 7 [IU] via SUBCUTANEOUS
  Administered 2023-04-12 (×2): 4 [IU] via SUBCUTANEOUS
  Administered 2023-04-12 – 2023-04-13 (×3): 7 [IU] via SUBCUTANEOUS
  Administered 2023-04-14: 4 [IU] via SUBCUTANEOUS
  Administered 2023-04-14: 3 [IU] via SUBCUTANEOUS
  Administered 2023-04-14: 4 [IU] via SUBCUTANEOUS
  Administered 2023-04-15 (×2): 3 [IU] via SUBCUTANEOUS
  Administered 2023-04-16: 4 [IU] via SUBCUTANEOUS
  Administered 2023-04-16: 3 [IU] via SUBCUTANEOUS
  Administered 2023-04-17: 4 [IU] via SUBCUTANEOUS

## 2023-04-10 MED ORDER — ALPRAZOLAM 0.5 MG PO TABS
0.5000 mg | ORAL_TABLET | Freq: Three times a day (TID) | ORAL | Status: DC | PRN
  Administered 2023-04-10 – 2023-04-11 (×2): 0.5 mg via ORAL
  Filled 2023-04-10 (×2): qty 1

## 2023-04-10 MED ORDER — LOSARTAN POTASSIUM 50 MG PO TABS
50.0000 mg | ORAL_TABLET | Freq: Every day | ORAL | Status: DC
  Administered 2023-04-10 – 2023-04-11 (×2): 50 mg via ORAL
  Filled 2023-04-10 (×3): qty 1

## 2023-04-10 MED ORDER — LORAZEPAM 2 MG/ML IJ SOLN: 1.0000 mg | Freq: Once | INTRAMUSCULAR | Status: DC

## 2023-04-10 MED ORDER — SPIRONOLACTONE 12.5 MG HALF TABLET
12.5000 mg | ORAL_TABLET | Freq: Every day | ORAL | Status: DC
  Administered 2023-04-10 – 2023-04-13 (×4): 12.5 mg via ORAL
  Filled 2023-04-10 (×4): qty 1

## 2023-04-10 NOTE — Progress Notes (Signed)
 PROGRESS NOTE  [name]  YQM:[id number] DOB: July 14, 1934 DOA: 04/08/2023 PCP: Joaquim Nam, MD   Brief Narrative: Patient is a 89 year old male with history of HFmrEF, insulin-dependent diabetes type 2, hypertension, hyperlipidemia, BPH, anxiety who presented from home with complaint of shortness of breath, cough.  Reported dyspnea on minimal exertion, weakness, had subjective fever at home.  Does not use oxygen at home.  Lives with son and grand daughter-in-law.  EMS found him hypoxic in the range of 88% on room air.  On presentation, he had mild grade fever, required 4 L of oxygen per minute via nasal cannula.  Lab work showed potassium 3.1, creatinine 1.2, elevated BNP of 618.  Influenza A found to be positive.  Chest x-ray showed a large cardiac silhouette, increased pulmonary vasculature with patchy bilateral airspace disease.  Started on Lasix, Tamiflu.  Echo showed diminished function from prior.  Cardiology also consulted  Assessment & Plan:  Principal Problem:   Acute on chronic heart failure with mildly reduced ejection fraction (HFmrEF, 41-49%) (HCC) Active Problems:   Acute respiratory failure with hypoxia (HCC)   Anxiety   Essential hypertension   CAD (coronary artery disease)   BPH with obstruction/lower urinary tract symptoms   Type 2 diabetes mellitus with vascular disease (HCC)   Hypokalemia   Influenza A  Acute hypoxic respiratory failure: Does not use oxygen at home.  Hypoxic on arrival.  Was on 4-5 L of oxygen.  Most likely from combination of acute on chronic CHF, influenza.  Weaned to room air today  Acute on chronic HFmrEF: Presented with dyspnea on exertion, elevated BNP.  CXR showed  features of volume overload on chest x-ray.  Last TTE done on 2/22 showed EF of 40 to 45%.  Monitor input/output, daily weight, started on Lasix 40 mg twice daily.  New echocardiogram showed EF of 35 to 40%.  Cardiology consulted.  Volume status looks improved  today.  Influenza A: Currently on Tamiflu.All family members has flu at home  Seizure-like activity: Patient had sudden onset of jerky movements in all his extremities this morning.  During this jerking movements, he has been completely alert and oriented.  Less likely seizure.  Given 1 mg of IV Ativan.  Will hold on starting on antiseizure medications.  Will get EEG.  Likely pseudoseizures.  No history of seizures in the past  Insulin-dependent diabetes 2: Continue current insulin regimen.  Monitor blood sugars  Hypertension: Continue Coreg, lisinopril, amlodipine, hydralazine.  Also on Lasix.  Started on spironolactone by cardiology  History of coronary artery disease: No anginal symptoms.  Mild elevated troponin likely from demand ischemia from CHF.  Continue Coreg, aspirin.  Not on statin due to history of myopathy  Anxiety: On Lexapro  BPH: On Flomax  Weakness: Ambulates with the help of walker.  Lives with family.  Consulted PT/OT        DVT prophylaxis:enoxaparin (LOVENOX) injection 40 mg Start: 04/09/23 1000     Code Status: Full Code  Family Communication: Called and discussed with son Casimiro Needle on phone on 2/27.  Called again for update twice today, calls not received  Patient status:Inpatient  Patient is from :home  Anticipated discharge BM:WUXL  Estimated DC date:1-2 days   Consultants: Cardiology  Procedures:None  Antimicrobials:  Anti-infectives (From admission, onward)    Start     Dose/Rate Route Frequency Ordered Stop   04/09/23 1000  oseltamivir (TAMIFLU) capsule 75 mg  Status:  Discontinued  75 mg Oral 2 times daily 04/09/23 0107 04/09/23 0111   04/09/23 1000  oseltamivir (TAMIFLU) capsule 30 mg        30 mg Oral 2 times daily 04/09/23 0111 04/13/23 2159   04/08/23 2345  oseltamivir (TAMIFLU) capsule 75 mg        75 mg Oral  Once 04/08/23 2338 04/09/23 0010       Subjective: Patient seen and examined at bedside today.  During my  evaluation this morning, he was sitting on the toilet.  He was comfortable.  On room air.  Home alert and oriented.  He said he feels better.  He still have some cough. I was called again later this morning for the evaluation of seizure-like activity.  He was having jerking movements on bilateral upper and lower extremities but he remained alert and oriented throughout the activity.  Objective: Vitals:   04/10/23 0002 04/10/23 0217 04/10/23 0500 04/10/23 0954  BP: (!) 177/98 (!) 160/73  (!) 211/105  Pulse: 73 (!) 41  (!) 42  Resp: 18 18    Temp: 98.1 F (36.7 C) 97.9 F (36.6 C)    TempSrc: Oral     SpO2: 96% 98%  98%  Weight:   102.7 kg   Height:        Intake/Output Summary (Last 24 hours) at 04/10/2023 1047 Last data filed at 04/10/2023 0541 Gross per 24 hour  Intake --  Output 1025 ml  Net -1025 ml   Filed Weights   04/08/23 2205 04/10/23 0500  Weight: 102.2 kg 102.7 kg    Examination:  General exam: Overall comfortable, not in distress, pleasant elderly male HEENT: PERRL Respiratory system: Bilateral rhonchi, mild wheezing no wheezes or crackles  Cardiovascular system: S1 & S2 heard, RRR.  Gastrointestinal system: Abdomen is nondistended, soft and nontender. Central nervous system: Alert and oriented Extremities: Trace edema on bilateral lower extremities , no clubbing ,no cyanosis Skin: No rashes, no ulcers,no icterus     Data Reviewed: I have personally reviewed following labs and imaging studies  CBC: Recent Labs  Lab 04/08/23 2225 04/09/23 0532 04/10/23 0526  WBC 9.8 8.6 7.2  NEUTROABS 7.5  --   --   HGB 12.2* 13.2 13.6  HCT 36.7* 40.2 43.3  MCV 90.6 91.4 93.5  PLT 236 263 266   Basic Metabolic Panel: Recent Labs  Lab 04/08/23 2225 04/09/23 0532 04/10/23 0526  NA 132* 132* 135  K 3.1* 3.5 3.6  CL 97* 97* 96*  CO2 24 25 27   GLUCOSE 173* 213* 272*  BUN 19 19 28*  CREATININE 1.20 1.18 1.23  CALCIUM 9.5 9.3 9.6  MG  --  1.8  --      Recent  Results (from the past 240 hours)  Blood Culture (routine x 2)     Status: None (Preliminary result)   Collection Time: 04/08/23 10:27 PM   Specimen: BLOOD RIGHT ARM  Result Value Ref Range Status   Specimen Description   Final    BLOOD RIGHT ARM Performed at [facility], [address], [place], [place] [address]    Special Requests   Final    BOTTLES DRAWN AEROBIC AND ANAEROBIC Blood Culture adequate volume Performed at [facility], [address] [place], [place] [address]    Culture   Final    NO GROWTH 1 DAY Performed at [facility], [address], [place], [place] [address]    Report Status PENDING  Incomplete  Resp panel by RT-PCR (RSV, Flu A&B, Covid) Anterior Nasal Swab     Status: Abnormal   Collection Time: 04/08/23 10:50 PM   Specimen: Anterior Nasal Swab  Result Value Ref Range Status   SARS Coronavirus 2 by RT PCR NEGATIVE NEGATIVE Final    Comment: (NOTE) SARS-CoV-2 target nucleic acids are NOT DETECTED.  The SARS-CoV-2 RNA is generally detectable in upper respiratory specimens during the acute phase of infection. The lowest concentration of SARS-CoV-2 viral copies this assay can detect is 138 copies/mL. A negative result does not preclude SARS-Cov-2 infection and should not be used as the sole basis for treatment or other patient management decisions. A negative result may occur with  improper specimen collection/handling, submission of specimen other than nasopharyngeal swab, presence of viral mutation(s) within the areas targeted by this assay, and inadequate number of viral copies(<138 copies/mL). A negative result must be combined with clinical observations, patient history, and epidemiological information. The expected result is Negative.  Fact Sheet for Patients:  BloggerCourse.com  Fact Sheet for Healthcare Providers:  SeriousBroker.it  This test is no  t yet approved or cleared by the [place] FDA and  has been authorized for detection and/or diagnosis of SARS-CoV-2 by FDA under an Emergency Use Authorization (EUA). This EUA will remain  in effect (meaning this test can be used) for the duration of the COVID-19 declaration under Section 564(b)(1) of the Act, 21 U.S.C.section 360bbb-3(b)(1), unless the authorization is terminated  or revoked sooner.       Influenza A by PCR POSITIVE (A) NEGATIVE Final   Influenza B by PCR NEGATIVE NEGATIVE Final    Comment: (NOTE) The Xpert Xpress SARS-CoV-2/FLU/RSV plus assay is intended as an aid in the diagnosis of influenza from Nasopharyngeal swab specimens and should not be used as a sole basis for treatment. Nasal washings and aspirates are unacceptable for Xpert Xpress SARS-CoV-2/FLU/RSV testing.  Fact Sheet for Patients: BloggerCourse.com  Fact Sheet for Healthcare Providers: SeriousBroker.it  This test is not yet approved or cleared by the [place] FDA and has been authorized for detection and/or diagnosis of SARS-CoV-2 by FDA under an Emergency Use Authorization (EUA). This EUA will remain in effect (meaning this test can be used) for the duration of the COVID-19 declaration under Section 564(b)(1) of the Act, 21 U.S.C. section 360bbb-3(b)(1), unless the authorization is terminated or revoked.     Resp Syncytial Virus by PCR NEGATIVE NEGATIVE Final    Comment: (NOTE) Fact Sheet for Patients: BloggerCourse.com  Fact Sheet for Healthcare Providers: SeriousBroker.it  This test is not yet approved or cleared by the [place] FDA and has been authorized for detection and/or diagnosis of SARS-CoV-2 by FDA under an Emergency Use Authorization (EUA). This EUA will remain in effect (meaning this test can be used) for the duration of the COVID-19 declaration under Section  564(b)(1) of the Act, 21 U.S.C. section 360bbb-3(b)(1), unless the authorization is terminated or revoked.  Performed at [facility], [address], [place], [place] [address]      Radiology Studies: ECHOCARDIOGRAM COMPLETE Result Date: 04/09/2023    ECHOCARDIOGRAM REPORT   Patient Name:   BRADEY LUZIER Date of Exam: 04/09/2023 Medical Rec #:  [id number]       Height:       72.0 in Accession #:    [id number]      Weight:       225.2 lb Date of Birth:  07/31/1934  BSA:          2.241 m Patient Age:    88 years        BP:           138/67 mmHg Patient Gender: M               HR:           61 bpm. Exam Location:  Inpatient Procedure: 2D Echo, Cardiac Doppler and Color Doppler (Both Spectral and Color            Flow Doppler were utilized during procedure). Indications:    Chest Pain  History:        Patient has prior history of Echocardiogram examinations, most                 recent 04/02/2020. CAD; Risk Factors:Hypertension, Diabetes and                 Dyslipidemia.  Sonographer:    Karma Ganja Referring Phys: [id number] VISHAL R PATEL IMPRESSIONS  1. Left ventricular ejection fraction, by estimation, is 35 to 40%. The left ventricle has moderately decreased function. The left ventricle demonstrates global hypokinesis. The left ventricular internal cavity size was mildly dilated. Left ventricular diastolic parameters are consistent with Grade II diastolic dysfunction (pseudonormalization).  2. Right ventricular systolic function is normal. The right ventricular size is moderately enlarged. Tricuspid regurgitation signal is inadequate for assessing PA pressure.  3. Left atrial size was mildly dilated.  4. The mitral valve is normal in structure. Trivial mitral valve regurgitation. No evidence of mitral stenosis.  5. The aortic valve was not well visualized. Aortic valve regurgitation is not visualized. No aortic stenosis is present.  6. The inferior vena cava is dilated in size with  <50% respiratory variability, suggesting right atrial pressure of 15 mmHg.  7. Aortic dilatation noted. There is mild dilatation of the aortic root, measuring 40 mm, but  8. Within normal limits for age when indexed to body surface area. Comparison(s): Prior images reviewed side by side. Left ventricle if more dilated from prior. Function appears worse (prior study uses echo-contrast). FINDINGS  Left Ventricle: Left ventricular ejection fraction, by estimation, is 35 to 40%. The left ventricle has moderately decreased function. The left ventricle demonstrates global hypokinesis. Strain imaging was not performed. The left ventricular internal cavity size was mildly dilated. There is no left ventricular hypertrophy. Left ventricular diastolic parameters are consistent with Grade II diastolic dysfunction (pseudonormalization). Right Ventricle: The right ventricular size is moderately enlarged. No increase in right ventricular wall thickness. Right ventricular systolic function is normal. Tricuspid regurgitation signal is inadequate for assessing PA pressure. Left Atrium: Left atrial size was mildly dilated. Right Atrium: Right atrial size was normal in size. Pericardium: There is no evidence of pericardial effusion. Mitral Valve: The mitral valve is normal in structure. Trivial mitral valve regurgitation. No evidence of mitral valve stenosis. Tricuspid Valve: The tricuspid valve is normal in structure. Tricuspid valve regurgitation is not demonstrated. No evidence of tricuspid stenosis. Aortic Valve: The aortic valve was not well visualized. Aortic valve regurgitation is not visualized. No aortic stenosis is present. Aortic valve mean gradient measures 3.0 mmHg. Aortic valve peak gradient measures 6.6 mmHg. Aortic valve area, by VTI measures 1.38 cm. Pulmonic Valve: The pulmonic valve was not well visualized. Pulmonic valve regurgitation is not visualized. No evidence of pulmonic stenosis. Aorta: Aortic dilatation  noted. Ascending aorta measurements are within normal limits for age when indexed  to body surface area. There is mild dilatation of the aortic root, measuring 40 mm. Venous: The inferior vena cava is dilated in size with less than 50% respiratory variability, suggesting right atrial pressure of 15 mmHg. IAS/Shunts: The atrial septum is grossly normal. Additional Comments: 3D imaging was not performed.  LEFT VENTRICLE PLAX 2D LVIDd:         6.50 cm      Diastology LVIDs:         5.30 cm      LV e' medial:    5.22 cm/s LV PW:         0.90 cm      LV E/e' medial:  15.3 LV IVS:        0.80 cm      LV e' lateral:   6.31 cm/s LVOT diam:     1.90 cm      LV E/e' lateral: 12.7 LV SV:         40 LV SV Index:   18 LVOT Area:     2.84 cm                              3D Volume EF: LV Volumes (MOD)            LV EDV:       234 ml LV vol d, MOD A2C: 194.0 ml LV ESV:       112 ml LV vol d, MOD A4C: 180.0 ml LV SV:        122 ml LV vol s, MOD A2C: 124.0 ml LV vol s, MOD A4C: 106.0 ml LV SV MOD A2C:     70.0 ml LV SV MOD A4C:     180.0 ml LV SV MOD BP:      70.7 ml RIGHT VENTRICLE            IVC RV Basal diam:  4.60 cm    IVC diam: 2.20 cm RV S prime:     8.49 cm/s TAPSE (M-mode): 2.0 cm LEFT ATRIUM              Index        RIGHT ATRIUM           Index LA diam:        4.60 cm  2.05 cm/m   RA Area:     19.00 cm LA Vol (A2C):   119.0 ml 53.11 ml/m  RA Volume:   51.90 ml  23.16 ml/m LA Vol (A4C):   54.5 ml  24.32 ml/m LA Biplane Vol: 80.1 ml  35.75 ml/m  AORTIC VALVE AV Area (Vmax):    1.59 cm AV Area (Vmean):   1.41 cm AV Area (VTI):     1.38 cm AV Vmax:           128.00 cm/s AV Vmean:          83.400 cm/s AV VTI:            0.287 m AV Peak Grad:      6.6 mmHg AV Mean Grad:      3.0 mmHg LVOT Vmax:         71.80 cm/s LVOT Vmean:        41.500 cm/s LVOT VTI:          0.140 m LVOT/AV VTI ratio: 0.49  AORTA Ao Root diam: 4.00 cm MITRAL VALVE MV Area (PHT): 2.91 cm  SHUNTS MV Decel Time: 261 msec    Systemic VTI:  0.14 m MV  E velocity: 80.10 cm/s  Systemic Diam: 1.90 cm MV A velocity: 55.30 cm/s MV E/A ratio:  1.45 Riley Lam MD Electronically signed by Riley Lam MD Signature Date/Time: 04/09/2023/1:57:00 PM    Final    DG Chest Port 1 View Result Date: 04/08/2023 CLINICAL DATA:  Fever, weakness, hypoxia EXAM: PORTABLE CHEST 1 VIEW COMPARISON:  01/22/2023 FINDINGS: Single frontal view of the chest demonstrates a stable enlarged cardiac silhouette. There is increased pulmonary vascular congestion, with patchy bilateral airspace disease. No effusion or pneumothorax. No acute bony abnormalities. IMPRESSION: 1. Constellation of findings most consistent with congestive heart failure. Underlying pneumonia cannot be excluded. Electronically Signed   By: Sharlet Salina M.D.   On: 04/08/2023 22:19    Scheduled Meds:  amLODipine  10 mg Oral q morning   aspirin EC  81 mg Oral QPM   carvedilol  12.5 mg Oral BID WC   enoxaparin (LOVENOX) injection  40 mg Subcutaneous Q24H   escitalopram  20 mg Oral Daily   furosemide  40 mg Intravenous Q12H   hydrALAZINE  50 mg Oral TID   insulin aspart  0-20 Units Subcutaneous TID WC   insulin aspart  0-5 Units Subcutaneous QHS   insulin aspart  0-5 Units Subcutaneous QHS   insulin glargine-yfgn  16 Units Subcutaneous QHS   LORazepam       LORazepam  1 mg Intravenous Once   losartan  50 mg Oral Daily   oseltamivir  30 mg Oral BID   predniSONE  40 mg Oral Q breakfast   sodium chloride flush  3 mL Intravenous Q12H   spironolactone  12.5 mg Oral Daily   tamsulosin  0.4 mg Oral QPC supper   Continuous Infusions:   LOS: 1 day   Burnadette Pop, MD [facility] P2/28/2025, 10:47 AM

## 2023-04-10 NOTE — Progress Notes (Signed)
 MEWS Progress Note  Patient Details Name: [name] MRN: [id number] DOB: Sep 27, 1934 Today's Date: 04/10/2023   MEWS Flowsheet Documentation:  Assess: MEWS Score Temp: 97.9 F (36.6 C) BP: (!) 211/105 MAP (mmHg): 129 Pulse Rate: (!) 42 ECG Heart Rate: 70 Resp: 18 Level of Consciousness: Alert SpO2: 98 % O2 Device: Room Air O2 Flow Rate (L/min): 2 L/min Assess: MEWS Score MEWS Temp: 0 MEWS Systolic: 2 MEWS Pulse: 1 MEWS RR: 0 MEWS LOC: 0 MEWS Score: 3 MEWS Score Color: Yellow Assess: SIRS CRITERIA SIRS Temperature : 0 SIRS Respirations : 0 SIRS Pulse: 0 SIRS WBC: 0 SIRS Score Sum : 0 SIRS Temperature : 0 SIRS Pulse: 0 SIRS Respirations : 0 SIRS WBC: 0 SIRS Score Sum : 0 Assess: if the MEWS score is Yellow or Red Were vital signs accurate and taken at a resting state?: No, vital signs rechecked Does the patient meet 2 or more of the SIRS criteria?: No MEWS guidelines implemented : Yes, yellow Treat MEWS Interventions: Considered administering scheduled or prn medications/treatments as ordered Take Vital Signs Increase Vital Sign Frequency : Yellow: Q2hr x1, continue Q4hrs until patient remains green for 12hrs Escalate MEWS: Escalate: Yellow: Discuss with charge nurse and consider notifying provider and/or RRT        Gus Rankin 04/10/2023, 11:18 AM

## 2023-04-10 NOTE — Evaluation (Signed)
 Physical Therapy Evaluation Patient Details Name: [name] MRN: [id number] DOB: [birth year] Today's Date: 04/10/2023  History of Present Illness  Pt is an 89 y.o. male admitted on 04/08/23 with dx of Acute respiratory failure with hypoxia, Acute on chronic HFmrEF, and Influenza A. PMH: HFmrEF, insulin-dependent DM2, HTN, hyperlipidemia, BPH, anxiety, CAD  Clinical Impression  Pt admitted with above diagnosis.  Pt currently with functional limitations due to the deficits listed below (see PT Problem List). Pt will benefit from acute skilled PT to increase their independence and safety with mobility to allow discharge.   Pt in recliner on arrival to room and reports just working with OT. Pt agreeable to ambulate however upon attempting to stand, pt required 3 attempts and unable to rise, then had seizure like episodes.  Pt with elevated HR and BP.  RN and Consulting civil engineer into room.  After pt assessed by staff, RN later requested PT assist pt back to bed so returned to safely assist pt to bed.  Anticipate pt to return home upon d/c pending medical work up since he was able to ambulate around room with OT per nursing and pt report.      If plan is discharge home, recommend the following: A little help with walking and/or transfers;A little help with bathing/dressing/bathroom   Can travel by private vehicle        Equipment Recommendations None recommended by PT  Recommendations for Other Services       Functional Status Assessment Patient has had a recent decline in their functional status and demonstrates the ability to make significant improvements in function in a reasonable and predictable amount of time.     Precautions / Restrictions Precautions Precautions: Fall Precaution/Restrictions Comments: Seizure?      Mobility  Bed Mobility Overal bed mobility: Needs Assistance Bed Mobility: Sit to Supine       Sit to supine: Min assist   General bed mobility comments: light assist  for LEs onto bed    Transfers Overall transfer level: Needs assistance Equipment used: Rolling walker (2 wheels) Transfers: Sit to/from Stand, Bed to chair/wheelchair/BSC Sit to Stand: Mod assist, +2 safety/equipment   Step pivot transfers: Min assist, +2 safety/equipment       General transfer comment: pt initially requiring more then max assist, and unable to rise with 3 attempts however then had some seizure-like activity so nursing staff called into room; pt seen by RN, charge RN and MD then RN requested assist for pt back to bed and pt required mod assist for rise, stabilize and control of descent    Ambulation/Gait                  Stairs            Wheelchair Mobility     Tilt Bed    Modified Rankin (Stroke Patients Only)       Balance Overall balance assessment: Mild deficits observed, not formally tested                                           Pertinent Vitals/Pain Pain Assessment Pain Assessment: No/denies pain    Home Living Family/patient expects to be discharged to:: Private residence Living Arrangements: Children Available Help at Discharge: Family;Available 24 hours/day Type of Home: House Home Access: Stairs to enter Entrance Stairs-Rails: Right Entrance Stairs-Number of Steps: 3  Home Layout: One level Home Equipment: Agricultural consultant (2 wheels);Cane - single point;BSC/3in1;Shower seat;Grab bars - toilet;Grab bars - tub/shower;Hand held shower head;Adaptive equipment;Lift chair      Prior Function Prior Level of Function : Independent/Modified Independent;Driving             Mobility Comments: used RW mod I ADLs Comments: family often assists with groceries     Extremity/Trunk Assessment        Lower Extremity Assessment Lower Extremity Assessment: Generalized weakness       Communication   Communication Communication: No apparent difficulties    Cognition Arousal: Alert Behavior During  Therapy: WFL for tasks assessed/performed   PT - Cognitive impairments: No apparent impairments                         Following commands: Intact       Cueing       General Comments      Exercises     Assessment/Plan    PT Assessment Patient needs continued PT services  PT Problem List Decreased strength;Decreased balance;Decreased mobility;Decreased activity tolerance;Decreased knowledge of use of DME       PT Treatment Interventions DME instruction;Gait training;Balance training;Functional mobility training;Therapeutic activities;Therapeutic exercise;Patient/family education;Stair training    PT Goals (Current goals can be found in the Care Plan section)  Acute Rehab PT Goals PT Goal Formulation: With patient Time For Goal Achievement: 04/24/23 Potential to Achieve Goals: Good    Frequency Min 1X/week     Co-evaluation               AM-PAC PT "6 Clicks" Mobility  Outcome Measure Help needed turning from your back to your side while in a flat bed without using bedrails?: A Little Help needed moving from lying on your back to sitting on the side of a flat bed without using bedrails?: A Lot Help needed moving to and from a bed to a chair (including a wheelchair)?: A Lot Help needed standing up from a chair using your arms (e.g., wheelchair or bedside chair)?: A Lot Help needed to walk in hospital room?: A Lot Help needed climbing 3-5 steps with a railing? : A Lot 6 Click Score: 13    End of Session Equipment Utilized During Treatment: Gait belt Activity Tolerance: Treatment limited secondary to medical complications (Comment) Patient left: in bed;with call bell/phone within reach;with bed alarm set;with nursing/sitter in room Nurse Communication: Mobility status PT Visit Diagnosis: Difficulty in walking, not elsewhere classified (R26.2);Muscle weakness (generalized) (M62.81)    Time: 1610-9604 PT Time Calculation (min) (ACUTE ONLY): 11  min   Charges:   PT Evaluation $PT Eval Low Complexity: 1 Low   PT General Charges $$ ACUTE PT VISIT: 1 Visit        Thomasene Mohair PT, DPT Physical Therapist Acute Rehabilitation Services Office: [phone]   Janan Halter [place] 04/10/2023, 12:24 PM

## 2023-04-10 NOTE — Telephone Encounter (Signed)
 Please check with son.  I need to defer to the inpatient team- please have him check with them.    He had neg RSV test 2 days ago, according to EMR.  I am awaiting the inpatient team notes.    I hope he feels better soon.  Thanks.

## 2023-04-10 NOTE — Consult Note (Signed)
 Cardiology Consultation   Patient ID: VANN OKERLUND MRN: [id number]; DOB: February 19, 1934  Admit date: 04/08/2023 Date of Consult: 04/10/2023  PCP:  Joaquim Nam, MD   [facility] Providers Cardiologist:  Donato Schultz, MD        Patient Profile:   PISTOL KESSENICH is a 89 y.o. male with a hx of chronic HFrEF, type 2 DM, HTN, HLD, BPH, anxiety who is being seen 04/10/2023 for the evaluation of CHF at the request of Dr. Renford Dills.  History of Present Illness:   Mr. Matters is an 89 year old male with above medical history.  Patient is followed by Dr. Anne Fu, but does not regularly see cardiology on an outpatient basis.  Patient previously had a nuclear stress test in 02/2012 that was a low risk study.  Later, echocardiogram 12/2013 showed EF 35-40% with mild LVH, diffuse hypokinesis, grade 1 DD.  Patient was admitted in 03/2020 with chest pain and left arm pain. He did have surgical resection of a left arm melanoma including biopsy of his lymph nodes in his left axilla a few days prior. He had a prolonged episode of nausea, vomiting as well. Overall, not felt to be ischemic. He underwent echocardiogram on 04/02/20 that showed EF 40-45%, grade I DD, normal RV function, normal PA systolic pressure. HE was discharged on lisinopril, hydrochlorothiazide, amlodipine, carvedilol, and hydralazine. He has not been seen by a cardiologist since that time.   Patient presented to the ED on 04/08/23 complaining of flu like symptoms. Reported generalized weakness, nausea, red tinged sputum. BP 170/100 on arrival, oxygen 88% on room air. Oxygen improved when started on [place]. In the ED, patient tested positive for influenza A. BNP elevated to 618.2, hsTn 32>37. CXR concerning for congestive heart failure, underlying pneumonia could not be excluded.   Patient was admitted to the internal medicine service for treatment of influenza A, acute respiratory failure. He underwent echocardiogram on 2/27 that showed  EF 35-40%, global hypokinesis, grade II DD, normal RV function. Cardiology consulted.   On interview, patient reports that he has had a reduced EF for about 10 years.  He denies ever having history of heart attack.  He does not regularly follow with a cardiologist.  He came to the ER due to weakness.  Reports that he was feeling so weak that he had a hard time standing and walking around his house.  He also had widespread body aches and a cough.  He denies shortness of breath, orthopnea, lower extremity swelling.  When he got to the ED, he was told that he had the flu.  He has been given IV Lasix and has had some improvement in his symptoms.  He denies chest pain, palpitations.  He takes meclizine at home for dizziness, and reports that the medicine has really been helping his dizziness.  He is currently feeling well without shortness of breath, orthopnea, lower extremity swelling, chest pain, palpitations.  On telemetry, he is maintaining normal sinus rhythm but he does have frequent ectopy with very frequent PACs and PVCs.  Past Medical History:  Diagnosis Date   Anxiety    Borderline hyperlipidemia    CAD (coronary artery disease)    Colon polyps    Complication of anesthesia    trouble urinating after anesthesia   Diverticulosis of colon    DJD (degenerative joint disease)    DM (diabetes mellitus) (HCC)    Adult onset  type 2   History of gastritis    History  of kidney stones    surgery to remove   History of pyelonephritis    History of syncope    HTN (hypertension)    Hypertrophy of prostate with urinary obstruction and other lower urinary tract symptoms (LUTS)    Melanoma (HCC)    back of neck   Melanoma (HCC) 2022   per dermatology   Myoclonus    Pancreatitis due to biliary obstruction several years ago   treated by Dr. Lina Sar    Past Surgical History:  Procedure Laterality Date   APPENDECTOMY     cartarized  nose blood vessel     left side   CATARACT EXTRACTION      RIGHT EYE   CHOLECYSTECTOMY     cystoscopy  04/17/11   stent placed   CYSTOSCOPY W/ URETERAL STENT PLACEMENT  04/17/2011   Procedure: CYSTOSCOPY WITH RETROGRADE PYELOGRAM/URETERAL STENT PLACEMENT;  Surgeon: Lindaann Slough, MD;  Location: [facility];  Service: Urology;  Laterality: Left;   CYSTOSCOPY WITH RETROGRADE PYELOGRAM, URETEROSCOPY AND STENT PLACEMENT Left 04/18/2013   Procedure: CYSTOSCOPY WITH LEFT RETROGRADE PYELOGRAM, LEFT URETEROSCOPY AND LASER LITHOTRIPSY LEFT STENT PLACEMENT;  Surgeon: Crecencio Mc, MD;  Location: [facility];  Service: Urology;  Laterality: Left;   HOLMIUM LASER APPLICATION Left 04/18/2013   Procedure: HOLMIUM LASER APPLICATION;  Surgeon: Crecencio Mc, MD;  Location: [facility];  Service: Urology;  Laterality: Left;   INCISION AND DRAINAGE ABSCESS Left 04/04/2020   Procedure: Angelina Pih OF LEFT ARM HEMATOMA;  Surgeon: Almond Lint, MD;  Location: [facility];  Service: General;  Laterality: Left;  left   IRRIGATION AND DEBRIDEMENT ABSCESS Left 10/05/2014   Procedure: Leighton Ruff D LEFT INDEX FINGER;  Surgeon: Knute Neu, MD;  Location: [facility];  Service: Plastics;  Laterality: Left;   KNEE SURGERY     bilateral ARTHROSCOPY X2 TO EACH KNEE   MELANOMA EXCISION  2018   MELANOMA EXCISION WITH SENTINEL LYMPH NODE BIOPSY Left 03/29/2020   Procedure: WIDE LOCAL EXCISION, ADVANCED FLAP CLOSURE LEFT ARM MELANOMA DEFECT WITH SENTINEL LYMPH NODE MAPPING AND BIOPSY;  Surgeon: Almond Lint, MD;  Location: [facility];  Service: General;  Laterality: Left;   S/P cysto & stents for kidnedy stone 11/08 by Dr. Wanda Plump     S/P ELap 1986 w/excision of leiomyoma @ GE junction, Meckle's divertic resected, & cholecystectomy     SHOULDER SURGERY  04/2005   left by Dr. Luiz Blare      Inpatient Medications: Scheduled Meds:  amLODipine  10 mg Oral q morning   aspirin EC  81 mg Oral QPM   carvedilol  12.5 mg Oral BID WC   enoxaparin (LOVENOX) injection  40 mg Subcutaneous Q24H   escitalopram  20 mg Oral Daily    furosemide  40 mg Intravenous Q12H   hydrALAZINE  50 mg Oral TID   insulin aspart  0-20 Units Subcutaneous TID WC   insulin aspart  0-5 Units Subcutaneous QHS   insulin aspart  0-5 Units Subcutaneous QHS   insulin glargine-yfgn  16 Units Subcutaneous QHS   lisinopril  20 mg Oral Daily   oseltamivir  30 mg Oral BID   predniSONE  40 mg Oral Q breakfast   sodium chloride flush  3 mL Intravenous Q12H   tamsulosin  0.4 mg Oral QPC supper   Continuous Infusions:  PRN Meds: acetaminophen **OR** acetaminophen, guaiFENesin, ipratropium-albuterol, ondansetron **OR** ondansetron (ZOFRAN) IV, senna-docusate, traMADol  Allergies:    Allergies  Allergen Reactions   Morphine Nausea Only, Other (See  Comments) and Nausea And Vomiting    Can take with food and usually doesn't cause nausea   Atorvastatin     Intolerant - nausea/myalgias   Codeine Phosphate Nausea And Vomiting   Metformin And Related Diarrhea    Social History:   Social History   Socioeconomic History   Marital status: Widowed    Spouse name: Not on file   Number of children: 2   Years of education: Not on file   Highest education level: Not on file  Occupational History   Occupation: Retired  Tobacco Use   Smoking status: Never   Smokeless tobacco: Never  Vaping Use   Vaping status: Never Used  Substance and Sexual Activity   Alcohol use: No    Alcohol/week: 0.0 standard drinks of alcohol   Drug use: No   Sexual activity: Not Currently  Other Topics Concern   Not on file  Social History Narrative   Widowed 04/17/04   Initially 2 kids- 1 son still alive as of Apr 18, 2022.  Daughter died 2018/05/19 in coronavirus pandemic.  Son lives with patient.    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   Right handed    Social Drivers of Corporate investment banker Strain: Low Risk  (07/22/2022)   Overall Financial Resource Strain (CARDIA)    Difficulty of Paying Living Expenses: Not hard at all  Food  Insecurity: No Food Insecurity (04/09/2023)   Hunger Vital Sign    Worried About Running Out of Food in the Last Year: Never true    Ran Out of Food in the Last Year: Never true  Transportation Needs: No Transportation Needs (04/09/2023)   PRAPARE - Administrator, Civil Service (Medical): No    Lack of Transportation (Non-Medical): No  Physical Activity: Sufficiently Active (07/22/2022)   Exercise Vital Sign    Days of Exercise per Week: 7 days    Minutes of Exercise per Session: 30 min  Stress: No Stress Concern Present (07/22/2022)   [employer] of Occupational Health - Occupational Stress Questionnaire    Feeling of Stress : Not at all  Social Connections: Moderately Isolated (04/09/2023)   Social Connection and Isolation Panel [NHANES]    Frequency of Communication with Friends and Family: More than three times a week    Frequency of Social Gatherings with Friends and Family: More than three times a week    Attends Religious Services: More than 4 times per year    Active Member of [employer] or Organizations: No    Attends Banker Meetings: Never    Marital Status: Widowed  Intimate Partner Violence: Not At Risk (04/09/2023)   Humiliation, Afraid, Rape, and Kick questionnaire    Fear of Current or Ex-Partner: No    Emotionally Abused: No    Physically Abused: No    Sexually Abused: No    Family History:    Family History  Problem Relation Age of Onset   Heart attack Mother    Dementia Brother    Colon cancer Neg Hx    Prostate cancer Neg Hx      ROS:  Please see the history of present illness.   All other ROS reviewed and negative.     Physical Exam/Data:   Vitals:   04/09/23 1943 04/10/23 0002 04/10/23 0217 04/10/23 0500  BP: (!) 153/78 (!) 177/98 (!) 160/73   Pulse: 72 73 (!) 41   Resp: 20 18 18  Temp: 97.9 F (36.6 C) 98.1 F (36.7 C) 97.9 F (36.6 C)   TempSrc: Axillary Oral    SpO2: 92% 96% 98%   Weight:    102.7 kg  Height:         Intake/Output Summary (Last 24 hours) at 04/10/2023 0937 Last data filed at 04/10/2023 0541 Gross per 24 hour  Intake --  Output 1025 ml  Net -1025 ml      04/10/2023    5:00 AM 04/08/2023   10:05 PM 04/06/2023   12:40 PM  Last 3 Weights  Weight (lbs) 226 lb 6.6 oz 225 lb 3.2 oz 225 lb 3.2 oz  Weight (kg) 102.7 kg 102.15 kg 102.15 kg     Body mass index is 30.71 kg/m.  General:  Well nourished, well developed, in no acute distress. Laying in the bed with head elevated  HEENT: normal Neck: no JVD Vascular: Radial pulses 2+ bilaterally Cardiac:  normal S1, S2; RRR with occasional skipped beats.  Lungs:  crackles in bilateral lung bases. Normal WOB on room air  Abd: soft, nontender Ext: no edema in BLE  Musculoskeletal:  No deformities  Skin: warm and dry  Neuro:  no focal abnormalities noted Psych:  Normal affect   EKG:  The EKG was personally reviewed and demonstrates:  EKG from 2/26 showed sinus rhythm with frequent PACs  Telemetry:  Telemetry was personally reviewed and demonstrates:  NSR with frequent ectopy. HR in the 80s-90s   Relevant CV Studies: Cardiac Studies & Procedures   ______________________________________________________________________________________________     ECHOCARDIOGRAM  ECHOCARDIOGRAM COMPLETE 04/09/2023  Narrative ECHOCARDIOGRAM REPORT    Patient Name:   KIMARION CHERY Date of Exam: 04/09/2023 Medical Rec #:  [id number]       Height:       72.0 in Accession #:    [id number]      Weight:       225.2 lb Date of Birth:  09-19-1934      BSA:          2.241 m Patient Age:    88 years        BP:           138/67 mmHg Patient Gender: M               HR:           61 bpm. Exam Location:  Inpatient  Procedure: 2D Echo, Cardiac Doppler and Color Doppler (Both Spectral and Color Flow Doppler were utilized during procedure).  Indications:    Chest Pain  History:        Patient has prior history of Echocardiogram examinations, most recent  04/02/2020. CAD; Risk Factors:Hypertension, Diabetes and Dyslipidemia.  Sonographer:    Karma Ganja Referring Phys: [id number] VISHAL R PATEL  IMPRESSIONS   1. Left ventricular ejection fraction, by estimation, is 35 to 40%. The left ventricle has moderately decreased function. The left ventricle demonstrates global hypokinesis. The left ventricular internal cavity size was mildly dilated. Left ventricular diastolic parameters are consistent with Grade II diastolic dysfunction (pseudonormalization). 2. Right ventricular systolic function is normal. The right ventricular size is moderately enlarged. Tricuspid regurgitation signal is inadequate for assessing PA pressure. 3. Left atrial size was mildly dilated. 4. The mitral valve is normal in structure. Trivial mitral valve regurgitation. No evidence of mitral stenosis. 5. The aortic valve was not well visualized. Aortic valve regurgitation is not visualized. No aortic stenosis is present. 6. The inferior vena cava is  dilated in size with <50% respiratory variability, suggesting right atrial pressure of 15 mmHg. 7. Aortic dilatation noted. There is mild dilatation of the aortic root, measuring 40 mm, but 8. Within normal limits for age when indexed to body surface area.  Comparison(s): Prior images reviewed side by side. Left ventricle if more dilated from prior. Function appears worse (prior study uses echo-contrast).  FINDINGS Left Ventricle: Left ventricular ejection fraction, by estimation, is 35 to 40%. The left ventricle has moderately decreased function. The left ventricle demonstrates global hypokinesis. Strain imaging was not performed. The left ventricular internal cavity size was mildly dilated. There is no left ventricular hypertrophy. Left ventricular diastolic parameters are consistent with Grade II diastolic dysfunction (pseudonormalization).  Right Ventricle: The right ventricular size is moderately enlarged. No increase in right  ventricular wall thickness. Right ventricular systolic function is normal. Tricuspid regurgitation signal is inadequate for assessing PA pressure.  Left Atrium: Left atrial size was mildly dilated.  Right Atrium: Right atrial size was normal in size.  Pericardium: There is no evidence of pericardial effusion.  Mitral Valve: The mitral valve is normal in structure. Trivial mitral valve regurgitation. No evidence of mitral valve stenosis.  Tricuspid Valve: The tricuspid valve is normal in structure. Tricuspid valve regurgitation is not demonstrated. No evidence of tricuspid stenosis.  Aortic Valve: The aortic valve was not well visualized. Aortic valve regurgitation is not visualized. No aortic stenosis is present. Aortic valve mean gradient measures 3.0 mmHg. Aortic valve peak gradient measures 6.6 mmHg. Aortic valve area, by VTI measures 1.38 cm.  Pulmonic Valve: The pulmonic valve was not well visualized. Pulmonic valve regurgitation is not visualized. No evidence of pulmonic stenosis.  Aorta: Aortic dilatation noted. Ascending aorta measurements are within normal limits for age when indexed to body surface area. There is mild dilatation of the aortic root, measuring 40 mm.  Venous: The inferior vena cava is dilated in size with less than 50% respiratory variability, suggesting right atrial pressure of 15 mmHg.  IAS/Shunts: The atrial septum is grossly normal.  Additional Comments: 3D imaging was not performed.   LEFT VENTRICLE PLAX 2D LVIDd:         6.50 cm      Diastology LVIDs:         5.30 cm      LV e' medial:    5.22 cm/s LV PW:         0.90 cm      LV E/e' medial:  15.3 LV IVS:        0.80 cm      LV e' lateral:   6.31 cm/s LVOT diam:     1.90 cm      LV E/e' lateral: 12.7 LV SV:         40 LV SV Index:   18 LVOT Area:     2.84 cm  3D Volume EF: LV Volumes (MOD)            LV EDV:       234 ml LV vol d, MOD A2C: 194.0 ml LV ESV:       112 ml LV vol d, MOD A4C: 180.0  ml LV SV:        122 ml LV vol s, MOD A2C: 124.0 ml LV vol s, MOD A4C: 106.0 ml LV SV MOD A2C:     70.0 ml LV SV MOD A4C:     180.0 ml LV SV MOD BP:  70.7 ml  RIGHT VENTRICLE            IVC RV Basal diam:  4.60 cm    IVC diam: 2.20 cm RV S prime:     8.49 cm/s TAPSE (M-mode): 2.0 cm  LEFT ATRIUM              Index        RIGHT ATRIUM           Index LA diam:        4.60 cm  2.05 cm/m   RA Area:     19.00 cm LA Vol (A2C):   119.0 ml 53.11 ml/m  RA Volume:   51.90 ml  23.16 ml/m LA Vol (A4C):   54.5 ml  24.32 ml/m LA Biplane Vol: 80.1 ml  35.75 ml/m AORTIC VALVE AV Area (Vmax):    1.59 cm AV Area (Vmean):   1.41 cm AV Area (VTI):     1.38 cm AV Vmax:           128.00 cm/s AV Vmean:          83.400 cm/s AV VTI:            0.287 m AV Peak Grad:      6.6 mmHg AV Mean Grad:      3.0 mmHg LVOT Vmax:         71.80 cm/s LVOT Vmean:        41.500 cm/s LVOT VTI:          0.140 m LVOT/AV VTI ratio: 0.49  AORTA Ao Root diam: 4.00 cm  MITRAL VALVE MV Area (PHT): 2.91 cm    SHUNTS MV Decel Time: 261 msec    Systemic VTI:  0.14 m MV E velocity: 80.10 cm/s  Systemic Diam: 1.90 cm MV A velocity: 55.30 cm/s MV E/A ratio:  1.45  Riley Lam MD Electronically signed by Riley Lam MD Signature Date/Time: 04/09/2023/1:57:00 PM    Final          ______________________________________________________________________________________________       Laboratory Data:  High Sensitivity Troponin:   Recent Labs  Lab 04/08/23 2225 04/09/23 0015  TROPONINIHS 32* 37*     Chemistry Recent Labs  Lab 04/08/23 2225 04/09/23 0532 04/10/23 0526  NA 132* 132* 135  K 3.1* 3.5 3.6  CL 97* 97* 96*  CO2 24 25 27   GLUCOSE 173* 213* 272*  BUN 19 19 28*  CREATININE 1.20 1.18 1.23  CALCIUM 9.5 9.3 9.6  MG  --  1.8  --   GFRNONAA 59* >60 57*  ANIONGAP 11 10 12     Recent Labs  Lab 04/08/23 2225  PROT 6.4*  ALBUMIN 3.6  AST 14*  ALT 15  ALKPHOS 50   BILITOT 1.0   Lipids No results for input(s): "CHOL", "TRIG", "HDL", "LABVLDL", "LDLCALC", "CHOLHDL" in the last 168 hours.  Hematology Recent Labs  Lab 04/08/23 2225 04/09/23 0532 04/10/23 0526  WBC 9.8 8.6 7.2  RBC 4.05* 4.40 4.63  HGB 12.2* 13.2 13.6  HCT 36.7* 40.2 43.3  MCV 90.6 91.4 93.5  MCH 30.1 30.0 29.4  MCHC 33.2 32.8 31.4  RDW 14.2 14.2 14.1  PLT 236 263 266   Thyroid No results for input(s): "TSH", "FREET4" in the last 168 hours.  BNP Recent Labs  Lab 04/08/23 2225  BNP 618.2*    DDimer No results for input(s): "DDIMER" in the last 168 hours.   Radiology/Studies:  ECHOCARDIOGRAM COMPLETE Result Date: 04/09/2023  ECHOCARDIOGRAM REPORT   Patient Name:   LENNEX PIETILA Date of Exam: 04/09/2023 Medical Rec #:  [id number]       Height:       72.0 in Accession #:    [id number]      Weight:       225.2 lb Date of Birth:  04-01-1934      BSA:          2.241 m Patient Age:    88 years        BP:           138/67 mmHg Patient Gender: M               HR:           61 bpm. Exam Location:  Inpatient Procedure: 2D Echo, Cardiac Doppler and Color Doppler (Both Spectral and Color            Flow Doppler were utilized during procedure). Indications:    Chest Pain  History:        Patient has prior history of Echocardiogram examinations, most                 recent 04/02/2020. CAD; Risk Factors:Hypertension, Diabetes and                 Dyslipidemia.  Sonographer:    Karma Ganja Referring Phys: [id number] VISHAL R PATEL IMPRESSIONS  1. Left ventricular ejection fraction, by estimation, is 35 to 40%. The left ventricle has moderately decreased function. The left ventricle demonstrates global hypokinesis. The left ventricular internal cavity size was mildly dilated. Left ventricular diastolic parameters are consistent with Grade II diastolic dysfunction (pseudonormalization).  2. Right ventricular systolic function is normal. The right ventricular size is moderately enlarged. Tricuspid  regurgitation signal is inadequate for assessing PA pressure.  3. Left atrial size was mildly dilated.  4. The mitral valve is normal in structure. Trivial mitral valve regurgitation. No evidence of mitral stenosis.  5. The aortic valve was not well visualized. Aortic valve regurgitation is not visualized. No aortic stenosis is present.  6. The inferior vena cava is dilated in size with <50% respiratory variability, suggesting right atrial pressure of 15 mmHg.  7. Aortic dilatation noted. There is mild dilatation of the aortic root, measuring 40 mm, but  8. Within normal limits for age when indexed to body surface area. Comparison(s): Prior images reviewed side by side. Left ventricle if more dilated from prior. Function appears worse (prior study uses echo-contrast). FINDINGS  Left Ventricle: Left ventricular ejection fraction, by estimation, is 35 to 40%. The left ventricle has moderately decreased function. The left ventricle demonstrates global hypokinesis. Strain imaging was not performed. The left ventricular internal cavity size was mildly dilated. There is no left ventricular hypertrophy. Left ventricular diastolic parameters are consistent with Grade II diastolic dysfunction (pseudonormalization). Right Ventricle: The right ventricular size is moderately enlarged. No increase in right ventricular wall thickness. Right ventricular systolic function is normal. Tricuspid regurgitation signal is inadequate for assessing PA pressure. Left Atrium: Left atrial size was mildly dilated. Right Atrium: Right atrial size was normal in size. Pericardium: There is no evidence of pericardial effusion. Mitral Valve: The mitral valve is normal in structure. Trivial mitral valve regurgitation. No evidence of mitral valve stenosis. Tricuspid Valve: The tricuspid valve is normal in structure. Tricuspid valve regurgitation is not demonstrated. No evidence of tricuspid stenosis. Aortic Valve: The aortic valve was not well  visualized. Aortic valve regurgitation  is not visualized. No aortic stenosis is present. Aortic valve mean gradient measures 3.0 mmHg. Aortic valve peak gradient measures 6.6 mmHg. Aortic valve area, by VTI measures 1.38 cm. Pulmonic Valve: The pulmonic valve was not well visualized. Pulmonic valve regurgitation is not visualized. No evidence of pulmonic stenosis. Aorta: Aortic dilatation noted. Ascending aorta measurements are within normal limits for age when indexed to body surface area. There is mild dilatation of the aortic root, measuring 40 mm. Venous: The inferior vena cava is dilated in size with less than 50% respiratory variability, suggesting right atrial pressure of 15 mmHg. IAS/Shunts: The atrial septum is grossly normal. Additional Comments: 3D imaging was not performed.  LEFT VENTRICLE PLAX 2D LVIDd:         6.50 cm      Diastology LVIDs:         5.30 cm      LV e' medial:    5.22 cm/s LV PW:         0.90 cm      LV E/e' medial:  15.3 LV IVS:        0.80 cm      LV e' lateral:   6.31 cm/s LVOT diam:     1.90 cm      LV E/e' lateral: 12.7 LV SV:         40 LV SV Index:   18 LVOT Area:     2.84 cm                              3D Volume EF: LV Volumes (MOD)            LV EDV:       234 ml LV vol d, MOD A2C: 194.0 ml LV ESV:       112 ml LV vol d, MOD A4C: 180.0 ml LV SV:        122 ml LV vol s, MOD A2C: 124.0 ml LV vol s, MOD A4C: 106.0 ml LV SV MOD A2C:     70.0 ml LV SV MOD A4C:     180.0 ml LV SV MOD BP:      70.7 ml RIGHT VENTRICLE            IVC RV Basal diam:  4.60 cm    IVC diam: 2.20 cm RV S prime:     8.49 cm/s TAPSE (M-mode): 2.0 cm LEFT ATRIUM              Index        RIGHT ATRIUM           Index LA diam:        4.60 cm  2.05 cm/m   RA Area:     19.00 cm LA Vol (A2C):   119.0 ml 53.11 ml/m  RA Volume:   51.90 ml  23.16 ml/m LA Vol (A4C):   54.5 ml  24.32 ml/m LA Biplane Vol: 80.1 ml  35.75 ml/m  AORTIC VALVE AV Area (Vmax):    1.59 cm AV Area (Vmean):   1.41 cm AV Area (VTI):      1.38 cm AV Vmax:           128.00 cm/s AV Vmean:          83.400 cm/s AV VTI:            0.287 m AV Peak Grad:  6.6 mmHg AV Mean Grad:      3.0 mmHg LVOT Vmax:         71.80 cm/s LVOT Vmean:        41.500 cm/s LVOT VTI:          0.140 m LVOT/AV VTI ratio: 0.49  AORTA Ao Root diam: 4.00 cm MITRAL VALVE MV Area (PHT): 2.91 cm    SHUNTS MV Decel Time: 261 msec    Systemic VTI:  0.14 m MV E velocity: 80.10 cm/s  Systemic Diam: 1.90 cm MV A velocity: 55.30 cm/s MV E/A ratio:  1.45 Riley Lam MD Electronically signed by Riley Lam MD Signature Date/Time: 04/09/2023/1:57:00 PM    Final    DG Chest Port 1 View Result Date: 04/08/2023 CLINICAL DATA:  Fever, weakness, hypoxia EXAM: PORTABLE CHEST 1 VIEW COMPARISON:  01/22/2023 FINDINGS: Single frontal view of the chest demonstrates a stable enlarged cardiac silhouette. There is increased pulmonary vascular congestion, with patchy bilateral airspace disease. No effusion or pneumothorax. No acute bony abnormalities. IMPRESSION: 1. Constellation of findings most consistent with congestive heart failure. Underlying pneumonia cannot be excluded. Electronically Signed   By: Sharlet Salina M.D.   On: 04/08/2023 22:19     Assessment and Plan:   Chronic HFrEF  NICM  - Patient previously underwent nuclear stress test in 02/2012 that was a low risk study. -Echocardiogram in 12/2013 showed EF 35-40%.  Recently, echocardiogram in 03/2020 showed EF 40-45% -Now, patient admitted with acute hypoxic respiratory failure due to influenza A, CHF.  BNP 618.  Chest x-ray concerning for CHF, could not exclude pneumonia -Echocardiogram this admission showed EF 35-40% with global hypokinesis, grade 2 DD, normal RV function -Patient has been on IV Lasix 40 mg twice daily.  Put out 1 L urine yesterday.  No function stable. - Continues to have crackles in bilateral lung bases but is otherwise euvolemic. Continue IV lasix today, plan to transition to PO tomorrow  -  BP is elevated - has been in the 140s-160s systolic this admission, up to 211/105 thisAM  -Continue carvedilol 12.5 mg twice daily -Patient has been on lisinopril.  Stop lisinopril, start losartan 50 mg daily today.  Plan to transition to [facility] once ACEi has been out of system for 36 hours -Patient was on hydrochlorothiazide at home.  So far has been held here.  Stop hydrochlorothiazide and start spironolactone 12.5 mg daily   PVCs, PACs  - Patient has frequent ectopy on telemetry. He is asymptomatic  - Continue carvedilol 12.5  mg BID. Titrate as BP allows  - Maintain K>4, mag >2.   HTN  - Patient is on several different BP medications. I would like to decrease his pill burden by titrating GDMT and stopping medications that are not indicated for CHF  - As above- stop hydrochlorothiazide and start spironolactone 12.5 mg daily. Stop lisinopril and start losartan today. Plan to transition to entresto tomorrow  - Continue carvedilol 12.5 mg BID  - Continue amlodipine 10 mg daily and hydralazine 50 mg TID for now. Plan to titrate entresto and spiro to hopefully be able to stop hydralazine and amlodipine prior to DC   Otherwise per primary  - Influenza A - Anxiety  - BPH  - Weakness    Risk Assessment/Risk Scores:   New York Heart Association (NYHA) Functional Class NYHA Class III  For questions or updates, please contact [facility] Please consult [email or web address] for contact info under    Signed, Jonita Albee,  PA-C  04/10/2023 9:37 AM

## 2023-04-10 NOTE — Progress Notes (Signed)
 Occupational Therapy Evaluation Patient Details Name: [name] MRN: [id number] DOB: 27-Sep-1934 Today's Date: 04/10/2023   History of Present Illness   Pt is an 89 y.o. male with dx of Acute respiratory failure with hypoxia, Acute on chronic HFmrEF, and Influenza A. PMH: HFmrEF, insulin-dependent DM2, HTN, hyperlipidemia, BPH, anxiety, CAD     Clinical Impressions PTA pt was mod independent for A/IADL's including driving living at home with daughter and her boyfriend. Pt reports family are able to assist 24/7. Pt now with decreased activity tolerance, weakness, balance deficits impacting BADL's and transfers and ambulation with functional status below. Pt would benefit from continue Acute OT services with recommendation to return home with family assist and follow up HHOT to maximize safety and independence in home setting.      If plan is discharge home, recommend the following:   A little help with walking and/or transfers;A little help with bathing/dressing/bathroom;Assistance with cooking/housework;Direct supervision/assist for medications management;Direct supervision/assist for financial management;Assist for transportation;Help with stairs or ramp for entrance     Functional Status Assessment   Patient has had a recent decline in their functional status and demonstrates the ability to make significant improvements in function in a reasonable and predictable amount of time.     Equipment Recommendations   None recommended by OT      Precautions/Restrictions   Precautions Precautions: Fall Precaution/Restrictions Comments: Seizure? Restrictions Weight Bearing Restrictions Per Provider Order: No     Mobility Bed Mobility Overal bed mobility: Needs Assistance Bed Mobility: Sit to Supine       Sit to supine: Min assist   General bed mobility comments: light assist for LEs onto bed    Transfers Overall transfer level: Needs assistance Equipment used:  Rolling walker (2 wheels) Transfers: Sit to/from Stand, Bed to chair/wheelchair/BSC Sit to Stand: Min assist     Step pivot transfers: Min assist     General transfer comment: use of grab bar, Rw and min cues for powering up      Balance Overall balance assessment: Mild deficits observed, not formally tested                                         ADL either performed or assessed with clinical judgement   ADL Overall ADL's : Needs assistance/impaired Eating/Feeding: Independent   Grooming: Wash/dry hands;Wash/dry face;Oral care;Applying deodorant;Supervision/safety;Sitting;Standing   Upper Body Bathing: Set up;Sitting   Lower Body Bathing: Sitting/lateral leans;Contact guard assist   Upper Body Dressing : Set up;Sitting   Lower Body Dressing: Contact guard assist;Sit to/from stand;Sitting/lateral leans   Toilet Transfer: Contact guard assist;Grab bars;Ambulation;Rolling walker (2 wheels) Toilet Transfer Details (indicate cue type and reason): min cues for forward weight shift Toileting- Clothing Manipulation and Hygiene: Set up       Functional mobility during ADLs: Contact guard assist;Rolling walker (2 wheels) General ADL Comments: decreased activity tolerance with rests needed     Vision Baseline Vision/History: 1 Wears glasses       Perception Perception: Within Functional Limits       Praxis Praxis: WFL       Pertinent Vitals/Pain Pain Assessment Pain Assessment: No/denies pain     Extremity/Trunk Assessment     Lower Extremity Assessment Lower Extremity Assessment: Generalized weakness       Communication Communication Communication: No apparent difficulties   Cognition Arousal: Alert Behavior During Therapy: [facility] for tasks  assessed/performed                                 Following commands: Intact                  Home Living Family/patient expects to be discharged to:: Private residence Living  Arrangements: Children Available Help at Discharge: Family;Available 24 hours/day Type of Home: House Home Access: Stairs to enter [employer] of Steps: 3 Entrance Stairs-Rails: Right Home Layout: One level     Bathroom Shower/Tub: Tub/shower unit;Curtain   Bathroom Toilet: Handicapped height Bathroom Accessibility: Yes How Accessible: Accessible via wheelchair;Accessible via walker Home Equipment: Rolling Walker (2 wheels);Cane - single point;BSC/3in1;Shower seat;Grab bars - toilet;Grab bars - tub/shower;Hand held shower head;Adaptive equipment;Lift chair          Prior Functioning/Environment Prior Level of Function : Independent/Modified Independent;Driving             Mobility Comments: used RW mod I ADLs Comments: family often assists with groceries    OT Problem List: Decreased strength;Decreased activity tolerance;Impaired balance (sitting and/or standing)   OT Treatment/Interventions: Self-care/ADL training;Therapeutic exercise;Neuromuscular education;Energy conservation;DME and/or AE instruction;Therapeutic activities;Patient/family education;Balance training      OT Goals(Current goals can be found in the care plan section)   Acute Rehab OT Goals Patient Stated Goal: to go home with family OT Goal Formulation: With patient Time For Goal Achievement: 04/24/23 Potential to Achieve Goals: Good   OT Frequency:  Min 2X/week       AM-PAC OT "6 Clicks" Daily Activity     Outcome Measure Help from another person eating meals?: None Help from another person taking care of personal grooming?: A Little Help from another person toileting, which includes using toliet, bedpan, or urinal?: A Little Help from another person bathing (including washing, rinsing, drying)?: A Little Help from another person to put on and taking off regular upper body clothing?: A Little Help from another person to put on and taking off regular lower body clothing?: A Little 6  Click Score: 19   End of Session Equipment Utilized During Treatment: Rolling walker (2 wheels);Gait belt Nurse Communication: Mobility status;Precautions  Activity Tolerance: Patient limited by fatigue Patient left: in chair;with call bell/phone within reach;with chair alarm set  OT Visit Diagnosis: Unsteadiness on feet (R26.81);Muscle weakness (generalized) (M62.81)                Time: 5784-6962 OT Time Calculation (min): 44 min Charges:  OT General Charges $OT Visit: 1 Visit OT Evaluation $OT Eval Moderate Complexity: 1 Mod OT Treatments $Self Care/Home Management : 23-37 mins  Jalexia Lalli OT/L Acute Rehabilitation Department  [phone]    04/10/2023, 1:34 PM

## 2023-04-10 NOTE — Telephone Encounter (Signed)
 Spoke to pt's son. He said they are doing a EEG right now. York Spaniel they are taking really good care of him.

## 2023-04-10 NOTE — TOC Initial Note (Signed)
 Transition of Care [facility]) - Initial/Assessment Note    Patient Details  Name: [name] MRN: [id number] Date of Birth: Jun 24, 1934  Transition of [facility]) CM/SW Contact:    Otelia Santee, LCSW Phone Number: 04/10/2023, 2:46 PM  Clinical Narrative:                 Met with pt and son at bedside. Pt lives with son and DIL. Pt has RW that he uses at baseline. Pt and son are agreeable to recommendation for [facility] services and do not have a preference for [facility]. HHPT/OT has been arranged with [facility]. HH orders will need to be placed prior to discharge.   Expected Discharge Plan: Home w Home Health Services Barriers to Discharge: Continued Medical Work up   Patient Goals and CMS Choice Patient states their goals for this hospitalization and ongoing recovery are:: For pt to return home          Expected Discharge Plan and Services In-house Referral: Clinical Social Work Discharge Planning Services: NA Post Acute Care Choice: Home Health Living arrangements for the past 2 months: Single Family Home                 DME Arranged: N/A DME Agency: NA       HH Arranged: PT, OT HH AgencyHotel manager Home Health Care Date HH Agency Contacted: 04/10/23 Time HH Agency Contacted: 1446 Representative spoke with at [facility] Agency: Cindie  Prior Living Arrangements/Services Living arrangements for the past 2 months: Single Family Home Lives with:: Adult Children Patient language and need for interpreter reviewed:: Yes Do you feel safe going back to the place where you live?: Yes      Need for Family Participation in Patient Care: No (Comment) Care giver support system in place?: Yes (comment) Current home services: DME (RW, cane, BSC, shower seat) Criminal Activity/Legal Involvement Pertinent to Current Situation/Hospitalization: No - Comment as needed  Activities of Daily Living   ADL Screening (condition at time of admission) Independently performs ADLs?: Yes (appropriate for  developmental age) Is the patient deaf or have difficulty hearing?: No Does the patient have difficulty seeing, even when wearing glasses/contacts?: Yes Does the patient have difficulty concentrating, remembering, or making decisions?: No  Permission Sought/Granted Permission sought to share information with : Family Supports, Oceanographer granted to share information with : Yes, Verbal Permission Granted  Share Information with NAME: Freiman,MICHAEL  Permission granted to share info w AGENCY: [facility]        Emotional Assessment   Attitude/Demeanor/Rapport: Unable to Assess Affect (typically observed): Unable to Assess Orientation: : Oriented to Self, Oriented to Place, Oriented to  Time, Oriented to Situation Alcohol / Substance Use: Not Applicable Psych Involvement: No (comment)  Admission diagnosis:  Influenza A [J10.1] Acute respiratory failure with hypoxia (HCC) [J96.01] Acute on chronic congestive heart failure, unspecified heart failure type (HCC) [I50.9] Acute hypoxic respiratory failure (HCC) [J96.01] Patient Active Problem List   Diagnosis Date Noted   Acute respiratory failure with hypoxia (HCC) 04/09/2023   Acute on chronic heart failure with mildly reduced ejection fraction (HFmrEF, 41-49%) (HCC) 04/09/2023   Influenza A 04/09/2023   Psychogenic nonepileptic seizure 02/01/2023   Obesity (BMI 30-39.9) 01/23/2023   Memory change 07/31/2022   Drug-induced myopathy 07/28/2021   Tachycardia 01/09/2021   DNR (do not resuscitate) 04/13/2020   Hematoma 04/05/2020   Epigastric abdominal pain 04/01/2020   History of melanoma 2022   Medicare annual wellness visit, subsequent  01/03/2019   Recurrent Epistaxis 03/02/2017   Vitamin B 12 deficiency 08/17/2015   Elevated TSH 10/06/2014   Unsteady gait 09/18/2014   Midline low back pain without sciatica 09/18/2014   Tremor 05/04/2014   Chronic combined systolic and diastolic congestive heart failure  (HCC) 01/27/2014   Ataxia 01/10/2014   UTI (urinary tract infection) 01/02/2014   History of coronary artery disease    Hypokalemia    Chest pain 12/15/2013   Healthcare maintenance 11/10/2013   Advance care planning 11/10/2013   ED (erectile dysfunction) 06/24/2013   HLD (hyperlipidemia) 03/22/2012   Type 2 diabetes mellitus with vascular disease (HCC) 03/22/2012   Thyroid cyst 03/19/2011   BPH with obstruction/lower urinary tract symptoms 03/15/2010   GASTRITIS 02/21/2007   Anxiety 02/15/2007   CAD (coronary artery disease) 02/15/2007   DIVERTICULOSIS OF COLON 02/15/2007   COLONIC POLYPS 02/12/2007   Essential hypertension 02/12/2007   RENAL CALCULUS 02/12/2007   Osteoarthritis 02/12/2007   PCP:  Joaquim Nam, MD Pharmacy:   [facility] - [place], [place] - [address] [place] [address] Phone: [phone] Fax: [phone]  [facility]) - [place], [place] - [address] [place] [address] [place] [address] Phone: [phone] Fax: [phone]  [facility] - [place], [place] - [address] [place]  [address] Phone: [phone] Fax: [phone]     Social Drivers of Health (SDOH) Social History: SDOH Screenings   Food Insecurity: No Food Insecurity (04/09/2023)  Housing: Low Risk  (04/09/2023)  Transportation Needs: No Transportation Needs (04/09/2023)  Utilities: Not At Risk (04/09/2023)  Alcohol Screen: Low Risk  (07/22/2022)  Depression (PHQ2-9): Low Risk  (03/24/2023)  Financial Resource Strain: Low Risk  (07/22/2022)  Physical Activity: Sufficiently Active (07/22/2022)  Social Connections: Moderately Isolated (04/09/2023)  Stress: No Stress Concern Present (07/22/2022)  Tobacco Use: Low Risk  (04/08/2023)   SDOH Interventions:     Readmission Risk Interventions    04/10/2023    1:41 PM  Readmission Risk  Prevention Plan  Transportation Screening Complete  PCP or Specialist Appt within 5-7 Days Complete  Home Care Screening Complete  Medication Review (RN CM) Complete

## 2023-04-10 NOTE — Inpatient Diabetes Management (Signed)
 Inpatient Diabetes Program Recommendations  AACE/ADA: New Consensus Statement on Inpatient Glycemic Control (2015)  Target Ranges:  Prepandial:   less than 140 mg/dL      Peak postprandial:   less than 180 mg/dL (1-2 hours)      Critically ill patients:  140 - 180 mg/dL   Lab Results  Component Value Date   GLUCAP 370 (H) 04/10/2023   HGBA1C 8.3 (A) 12/29/2022    Review of Glycemic Control  Diabetes history: DM2 Outpatient Diabetes medications: [name] 16 at bedtime, Humalog 4-10 TID Current orders for Inpatient glycemic control: Lantus 20 at bedtime, Novolog 0-20 TID with meals and 0-5 HS, Jardiance 10 daily  HgbA1C - 8.3% CBGs 226, 229  Inpatient Diabetes Program Recommendations:    Consider adding Novolog 2 units TID with meals if eating > 50%  Continue to follow glucose trends.  Thank you. Ailene Ards, RD, LDN, CDCES Inpatient Diabetes Coordinator [phone]

## 2023-04-10 NOTE — Progress Notes (Signed)
   04/10/23 0954  Assess: MEWS Score  BP (!) 211/105  MAP (mmHg) 129  Pulse Rate (!) 42  SpO2 98 %  O2 Device Room Air  Assess: MEWS Score  MEWS Temp 0  MEWS Systolic 2  MEWS Pulse 1  MEWS RR 0  MEWS LOC 0  MEWS Score 3  MEWS Score Color Yellow  Assess: if the MEWS score is Yellow or Red  Were vital signs accurate and taken at a resting state? No, vital signs rechecked  Does the patient meet 2 or more of the SIRS criteria? No  MEWS guidelines implemented  Yes, yellow  Treat  MEWS Interventions Considered administering scheduled or prn medications/treatments as ordered  Take Vital Signs  Increase Vital Sign Frequency  Yellow: Q2hr x1, continue Q4hrs until patient remains green for 12hrs  Escalate  MEWS: Escalate Yellow: Discuss with charge nurse and consider notifying provider and/or RRT  Notify: Charge Nurse/RN  Name of Charge Nurse/RN Notified Gelene Mink RN  Provider Notification  Provider Name/Title Renford Dills, MD  Date Provider Notified 04/10/23  Time Provider Notified [phone]  Method of Notification Page  Notification Reason Change in status  Provider response At bedside;Other (Comment) (Ordered 1mg  of Ativan)  Date of Provider Response 04/10/23  Time of Provider Response [phone]  Notify: Rapid Response  Name of Rapid Response RN Notified Ethan RN  Date Rapid Response Notified 04/10/23  Time Rapid Response Notified 0952  Assess: SIRS CRITERIA  SIRS Temperature  0  SIRS Respirations  0  SIRS Pulse 0  SIRS WBC 0  SIRS Score Sum  0

## 2023-04-10 NOTE — Plan of Care (Signed)

## 2023-04-10 NOTE — TOC CM/SW Note (Signed)

## 2023-04-10 NOTE — Progress Notes (Signed)
 EEG complete - results pending

## 2023-04-10 NOTE — Telephone Encounter (Signed)
 Copied from CRM [id number]. Topic: General - Other >> Apr 10, 2023  1:25 PM Gurney Maxin H wrote: Reason for CRM: Patients son is calling to notify provider that patient is in the hospital with the flu, patients son also states he wanted provider know that his dad also had another shaking episode and spitting up blood as well. Casimiro Needle was also asking if Dr. Para March thinks patient needs a RSV test, and if Dr. Para March can look at his chart and see what's going on with his dad and call him.  Casimiro Needle [phone]

## 2023-04-10 NOTE — Procedures (Signed)
 Patient Name: [name]  MRN: [id number]  Epilepsy Attending: Charlsie Quest  Referring Physician/Provider: Burnadette Pop, MD  Date: 04/10/2023 Duration: 24.15 mins  Patient history: 89 yo male patient had sudden onset of jerky movements in all his extremities this morning. EEG to evaluate for seizure  Level of alertness: Awake  AEDs during EEG study: Ativan  Technical aspects: This EEG study was done with scalp electrodes positioned according to the 10-20 International system of electrode placement. Electrical activity was reviewed with band pass filter of 1-70Hz , sensitivity of 7 uV/mm, display speed of 75mm/sec with a 60Hz  notched filter applied as appropriate. EEG data were recorded continuously and digitally stored.  Video monitoring was available and reviewed as appropriate.  Description: The posterior dominant rhythm consists of 9 Hz activity of moderate voltage (25-35 uV) seen predominantly in posterior head regions, symmetric and reactive to eye opening and eye closing. Hyperventilation and photic stimulation were not performed.     IMPRESSION: This study is within normal limits. No seizures or epileptiform discharges were seen throughout the recording.  A normal interictal EEG does not exclude  the diagnosis of epilepsy.  Earlyne Feeser Annabelle Harman

## 2023-04-11 DIAGNOSIS — I428 Other cardiomyopathies: Secondary | ICD-10-CM | POA: Diagnosis not present

## 2023-04-11 DIAGNOSIS — E1159 Type 2 diabetes mellitus with other circulatory complications: Secondary | ICD-10-CM

## 2023-04-11 DIAGNOSIS — I10 Essential (primary) hypertension: Secondary | ICD-10-CM | POA: Diagnosis not present

## 2023-04-11 DIAGNOSIS — E876 Hypokalemia: Secondary | ICD-10-CM

## 2023-04-11 DIAGNOSIS — F419 Anxiety disorder, unspecified: Secondary | ICD-10-CM

## 2023-04-11 DIAGNOSIS — I2510 Atherosclerotic heart disease of native coronary artery without angina pectoris: Secondary | ICD-10-CM

## 2023-04-11 DIAGNOSIS — I5023 Acute on chronic systolic (congestive) heart failure: Secondary | ICD-10-CM | POA: Diagnosis not present

## 2023-04-11 DIAGNOSIS — N401 Enlarged prostate with lower urinary tract symptoms: Secondary | ICD-10-CM

## 2023-04-11 DIAGNOSIS — E66811 Obesity, class 1: Secondary | ICD-10-CM

## 2023-04-11 DIAGNOSIS — J101 Influenza due to other identified influenza virus with other respiratory manifestations: Secondary | ICD-10-CM | POA: Diagnosis not present

## 2023-04-11 DIAGNOSIS — N138 Other obstructive and reflux uropathy: Secondary | ICD-10-CM

## 2023-04-11 LAB — CBC
HCT: 40.5 % (ref 39.0–52.0)
Hemoglobin: 13 g/dL (ref 13.0–17.0)
MCH: 29.3 pg (ref 26.0–34.0)
MCHC: 32.1 g/dL (ref 30.0–36.0)
MCV: 91.4 fL (ref 80.0–100.0)
Platelets: 275 10*3/uL (ref 150–400)
RBC: 4.43 MIL/uL (ref 4.22–5.81)
RDW: 14.2 % (ref 11.5–15.5)
WBC: 13.6 10*3/uL — ABNORMAL HIGH (ref 4.0–10.5)
nRBC: 0 % (ref 0.0–0.2)

## 2023-04-11 LAB — GLUCOSE, CAPILLARY
Glucose-Capillary: 225 mg/dL — ABNORMAL HIGH (ref 70–99)
Glucose-Capillary: 242 mg/dL — ABNORMAL HIGH (ref 70–99)
Glucose-Capillary: 239 mg/dL — ABNORMAL HIGH (ref 70–99)
Glucose-Capillary: 241 mg/dL — ABNORMAL HIGH (ref 70–99)

## 2023-04-11 LAB — BASIC METABOLIC PANEL
Anion gap: 12 (ref 5–15)
BUN: 37 mg/dL — ABNORMAL HIGH (ref 8–23)
CO2: 28 mmol/L (ref 22–32)
Calcium: 9.4 mg/dL (ref 8.9–10.3)
Chloride: 95 mmol/L — ABNORMAL LOW (ref 98–111)
Creatinine, Ser: 1.16 mg/dL (ref 0.61–1.24)
GFR, Estimated: >60 mL/min (ref >60)
Glucose, Bld: 228 mg/dL — ABNORMAL HIGH (ref 70–99)
Potassium: 3 mmol/L — ABNORMAL LOW (ref 3.5–5.1)
Sodium: 135 mmol/L (ref 135–145)

## 2023-04-11 MED ORDER — POTASSIUM CHLORIDE CRYS ER 20 MEQ PO TBCR
40.0000 meq | EXTENDED_RELEASE_TABLET | ORAL | Status: AC
  Administered 2023-04-11 (×2): 40 meq via ORAL
  Filled 2023-04-11 (×2): qty 2

## 2023-04-11 MED ORDER — EMPAGLIFLOZIN 10 MG PO TABS
10.0000 mg | ORAL_TABLET | Freq: Every day | ORAL | Status: DC
  Administered 2023-04-11 – 2023-04-13 (×3): 10 mg via ORAL
  Filled 2023-04-11 (×3): qty 1

## 2023-04-11 NOTE — Progress Notes (Signed)
 Rounding Note    Patient Name: [name] Date of Encounter: 04/11/2023  [facility] Cardiologist: Donato Schultz, MD   Subjective   He still has a significant cough.   Inpatient Medications    Scheduled Meds:  amLODipine  10 mg Oral q morning   aspirin EC  81 mg Oral QPM   carvedilol  12.5 mg Oral BID WC   enoxaparin (LOVENOX) injection  40 mg Subcutaneous Q24H   escitalopram  20 mg Oral Daily   furosemide  40 mg Intravenous Q12H   hydrALAZINE  50 mg Oral TID   insulin aspart  0-20 Units Subcutaneous TID WC   insulin aspart  0-5 Units Subcutaneous QHS   insulin aspart  0-5 Units Subcutaneous QHS   insulin glargine-yfgn  16 Units Subcutaneous QHS   losartan  50 mg Oral Daily   oseltamivir  30 mg Oral BID   predniSONE  40 mg Oral Q breakfast   sodium chloride flush  3 mL Intravenous Q12H   spironolactone  12.5 mg Oral Daily   tamsulosin  0.4 mg Oral QPC supper   Continuous Infusions:  PRN Meds: acetaminophen **OR** acetaminophen, ALPRAZolam, guaiFENesin, ipratropium-albuterol, ondansetron **OR** ondansetron (ZOFRAN) IV, senna-docusate, traMADol   Vital Signs    Vitals:   04/10/23 1951 04/10/23 2312 04/11/23 0327 04/11/23 0500  BP: 123/70 133/66 (!) 144/98   Pulse: 86 (!) 53 76   Resp: 18 18 18    Temp: 98.5 F (36.9 C) 98.1 F (36.7 C) 97.6 F (36.4 C)   TempSrc:  Oral    SpO2: 94% 100% 92%   Weight:    103.8 kg  Height:        Intake/Output Summary (Last 24 hours) at 04/11/2023 0856 Last data filed at 04/11/2023 0500 Gross per 24 hour  Intake 120 ml  Output 750 ml  Net -630 ml      04/11/2023    5:00 AM 04/10/2023    5:00 AM 04/08/2023   10:05 PM  Last 3 Weights  Weight (lbs) 228 lb 13.4 oz 226 lb 6.6 oz 225 lb 3.2 oz  Weight (kg) 103.8 kg 102.7 kg 102.15 kg      Telemetry    Sinus with runs of PACs, PVCs- Personally Reviewed  ECG    No new- Personally Reviewed  Physical Exam   Vitals:   04/10/23 2312 04/11/23 0327  BP: 133/66  (!) 144/98  Pulse: (!) 53 76  Resp: 18 18  Temp: 98.1 F (36.7 C) 97.6 F (36.4 C)  SpO2: 100% 92%    GEN: No acute distress.   Neck: + JVD Cardiac: RRR, no murmurs, rubs, or gallops.  Respiratory: nl wob, coarse breath sounds bilaterally GI: Soft, nontender, non-distended  MS: No edema; No deformity. Neuro:  Nonfocal  Psych: Normal affect   Labs    High Sensitivity Troponin:   Recent Labs  Lab 04/08/23 2225 04/09/23 0015  TROPONINIHS 32* 37*     Chemistry Recent Labs  Lab 04/08/23 2225 04/09/23 0532 04/10/23 0526  NA 132* 132* 135  K 3.1* 3.5 3.6  CL 97* 97* 96*  CO2 24 25 27   GLUCOSE 173* 213* 272*  BUN 19 19 28*  CREATININE 1.20 1.18 1.23  CALCIUM 9.5 9.3 9.6  MG  --  1.8  --   PROT 6.4*  --   --   ALBUMIN 3.6  --   --   AST 14*  --   --   ALT  15  --   --   ALKPHOS 50  --   --   BILITOT 1.0  --   --   GFRNONAA 59* >60 57*  ANIONGAP 11 10 12     Lipids No results for input(s): "CHOL", "TRIG", "HDL", "LABVLDL", "LDLCALC", "CHOLHDL" in the last 168 hours.  Hematology Recent Labs  Lab 04/09/23 0532 04/10/23 0526 04/11/23 0710  WBC 8.6 7.2 13.6*  RBC 4.40 4.63 4.43  HGB 13.2 13.6 13.0  HCT 40.2 43.3 40.5  MCV 91.4 93.5 91.4  MCH 30.0 29.4 29.3  MCHC 32.8 31.4 32.1  RDW 14.2 14.1 14.2  PLT 263 266 275   Thyroid No results for input(s): "TSH", "FREET4" in the last 168 hours.  BNP Recent Labs  Lab 04/08/23 2225  BNP 618.2*    DDimer No results for input(s): "DDIMER" in the last 168 hours.   Radiology    EEG adult Result Date: 04/10/2023 Charlsie Quest, MD     04/10/2023  6:26 PM Patient Name: [name] MRN: [id number] Epilepsy Attending: Charlsie Quest Referring Physician/Provider: Burnadette Pop, MD Date: 04/10/2023 Duration: 24.15 mins Patient history: 89 yo male patient had sudden onset of jerky movements in all his extremities this morning. EEG to evaluate for seizure Level of alertness: Awake AEDs during EEG study: Ativan  Technical aspects: This EEG study was done with scalp electrodes positioned according to the 10-20 International system of electrode placement. Electrical activity was reviewed with band pass filter of 1-70Hz , sensitivity of 7 uV/mm, display speed of 39mm/sec with a 60Hz  notched filter applied as appropriate. EEG data were recorded continuously and digitally stored.  Video monitoring was available and reviewed as appropriate. Description: The posterior dominant rhythm consists of 9 Hz activity of moderate voltage (25-35 uV) seen predominantly in posterior head regions, symmetric and reactive to eye opening and eye closing. Hyperventilation and photic stimulation were not performed.   IMPRESSION: This study is within normal limits. No seizures or epileptiform discharges were seen throughout the recording. A normal interictal EEG does not exclude  the diagnosis of epilepsy. Charlsie Quest   ECHOCARDIOGRAM COMPLETE Result Date: 04/09/2023    ECHOCARDIOGRAM REPORT   Patient Name:   [name] Date of Exam: 04/09/2023 Medical Rec #:  [id number]       Height:       72.0 in Accession #:    [id number]      Weight:       225.2 lb Date of Birth:  1934/10/02      BSA:          2.241 m Patient Age:    88 years        BP:           138/67 mmHg Patient Gender: M               HR:           61 bpm. Exam Location:  Inpatient Procedure: 2D Echo, Cardiac Doppler and Color Doppler (Both Spectral and Color            Flow Doppler were utilized during procedure). Indications:    Chest Pain  History:        Patient has prior history of Echocardiogram examinations, most                 recent 04/02/2020. CAD; Risk Factors:Hypertension, Diabetes and  Dyslipidemia.  Sonographer:    Karma Ganja Referring Phys: [id number] VISHAL R PATEL IMPRESSIONS  1. Left ventricular ejection fraction, by estimation, is 35 to 40%. The left ventricle has moderately decreased function. The left ventricle demonstrates global hypokinesis.  The left ventricular internal cavity size was mildly dilated. Left ventricular diastolic parameters are consistent with Grade II diastolic dysfunction (pseudonormalization).  2. Right ventricular systolic function is normal. The right ventricular size is moderately enlarged. Tricuspid regurgitation signal is inadequate for assessing PA pressure.  3. Left atrial size was mildly dilated.  4. The mitral valve is normal in structure. Trivial mitral valve regurgitation. No evidence of mitral stenosis.  5. The aortic valve was not well visualized. Aortic valve regurgitation is not visualized. No aortic stenosis is present.  6. The inferior vena cava is dilated in size with <50% respiratory variability, suggesting right atrial pressure of 15 mmHg.  7. Aortic dilatation noted. There is mild dilatation of the aortic root, measuring 40 mm, but  8. Within normal limits for age when indexed to body surface area. Comparison(s): Prior images reviewed side by side. Left ventricle if more dilated from prior. Function appears worse (prior study uses echo-contrast). FINDINGS  Left Ventricle: Left ventricular ejection fraction, by estimation, is 35 to 40%. The left ventricle has moderately decreased function. The left ventricle demonstrates global hypokinesis. Strain imaging was not performed. The left ventricular internal cavity size was mildly dilated. There is no left ventricular hypertrophy. Left ventricular diastolic parameters are consistent with Grade II diastolic dysfunction (pseudonormalization). Right Ventricle: The right ventricular size is moderately enlarged. No increase in right ventricular wall thickness. Right ventricular systolic function is normal. Tricuspid regurgitation signal is inadequate for assessing PA pressure. Left Atrium: Left atrial size was mildly dilated. Right Atrium: Right atrial size was normal in size. Pericardium: There is no evidence of pericardial effusion. Mitral Valve: The mitral valve is normal  in structure. Trivial mitral valve regurgitation. No evidence of mitral valve stenosis. Tricuspid Valve: The tricuspid valve is normal in structure. Tricuspid valve regurgitation is not demonstrated. No evidence of tricuspid stenosis. Aortic Valve: The aortic valve was not well visualized. Aortic valve regurgitation is not visualized. No aortic stenosis is present. Aortic valve mean gradient measures 3.0 mmHg. Aortic valve peak gradient measures 6.6 mmHg. Aortic valve area, by VTI measures 1.38 cm. Pulmonic Valve: The pulmonic valve was not well visualized. Pulmonic valve regurgitation is not visualized. No evidence of pulmonic stenosis. Aorta: Aortic dilatation noted. Ascending aorta measurements are within normal limits for age when indexed to body surface area. There is mild dilatation of the aortic root, measuring 40 mm. Venous: The inferior vena cava is dilated in size with less than 50% respiratory variability, suggesting right atrial pressure of 15 mmHg. IAS/Shunts: The atrial septum is grossly normal. Additional Comments: 3D imaging was not performed.  LEFT VENTRICLE PLAX 2D LVIDd:         6.50 cm      Diastology LVIDs:         5.30 cm      LV e' medial:    5.22 cm/s LV PW:         0.90 cm      LV E/e' medial:  15.3 LV IVS:        0.80 cm      LV e' lateral:   6.31 cm/s LVOT diam:     1.90 cm      LV E/e' lateral: 12.7 LV SV:  40 LV SV Index:   18 LVOT Area:     2.84 cm                              3D Volume EF: LV Volumes (MOD)            LV EDV:       234 ml LV vol d, MOD A2C: 194.0 ml LV ESV:       112 ml LV vol d, MOD A4C: 180.0 ml LV SV:        122 ml LV vol s, MOD A2C: 124.0 ml LV vol s, MOD A4C: 106.0 ml LV SV MOD A2C:     70.0 ml LV SV MOD A4C:     180.0 ml LV SV MOD BP:      70.7 ml RIGHT VENTRICLE            IVC RV Basal diam:  4.60 cm    IVC diam: 2.20 cm RV S prime:     8.49 cm/s TAPSE (M-mode): 2.0 cm LEFT ATRIUM              Index        RIGHT ATRIUM           Index LA diam:         4.60 cm  2.05 cm/m   RA Area:     19.00 cm LA Vol (A2C):   119.0 ml 53.11 ml/m  RA Volume:   51.90 ml  23.16 ml/m LA Vol (A4C):   54.5 ml  24.32 ml/m LA Biplane Vol: 80.1 ml  35.75 ml/m  AORTIC VALVE AV Area (Vmax):    1.59 cm AV Area (Vmean):   1.41 cm AV Area (VTI):     1.38 cm AV Vmax:           128.00 cm/s AV Vmean:          83.400 cm/s AV VTI:            0.287 m AV Peak Grad:      6.6 mmHg AV Mean Grad:      3.0 mmHg LVOT Vmax:         71.80 cm/s LVOT Vmean:        41.500 cm/s LVOT VTI:          0.140 m LVOT/AV VTI ratio: 0.49  AORTA Ao Root diam: 4.00 cm MITRAL VALVE MV Area (PHT): 2.91 cm    SHUNTS MV Decel Time: 261 msec    Systemic VTI:  0.14 m MV E velocity: 80.10 cm/s  Systemic Diam: 1.90 cm MV A velocity: 55.30 cm/s MV E/A ratio:  1.45 Riley Lam MD Electronically signed by Riley Lam MD Signature Date/Time: 04/09/2023/1:57:00 PM    Final     Cardiac Studies   TTE 04/09/2023  1. Left ventricular ejection fraction, by estimation, is 35 to 40%. The  left ventricle has moderately decreased function. The left ventricle  demonstrates global hypokinesis. The left ventricular internal cavity size  was mildly dilated. Left ventricular  diastolic parameters are consistent with Grade II diastolic dysfunction  (pseudonormalization).   2. Right ventricular systolic function is normal. The right ventricular  size is moderately enlarged. Tricuspid regurgitation signal is inadequate  for assessing PA pressure.   3. Left atrial size was mildly dilated.   4. The mitral valve is normal in structure. Trivial mitral valve  regurgitation. No evidence  of mitral stenosis.   5. The aortic valve was not well visualized. Aortic valve regurgitation  is not visualized. No aortic stenosis is present.   6. The inferior vena cava is dilated in size with <50% respiratory  variability, suggesting right atrial pressure of 15 mmHg.   7. Aortic dilatation noted. There is mild dilatation of  the aortic root,  measuring 40 mm, but   8. Within normal limits for age when indexed to body surface area.   Comparison(s): Prior images reviewed side by side. Left ventricle if more  dilated from prior. Function appears worse (prior study uses  echo-contrast).    Patient Profile  This is an 89yo male with a hx o chronic HFrEF, DM2, HTN, HLD followed by Dr. Anne Fu.  He was dx with DCM back in 2014 and stress test showed no ischemia.  Echo showed EF 35-40% with GHK.  Repeat echo 2022 with EF 40-45%  who cardiology was consulted for reduced EF in the setting of influenza    Assessment & Plan  Acute decompensated heart failure He has history of nonischemic cardiomyopathy with an EF 35 to 40% by echo in 2015 that improved to 40 to 45% in 2022.  He was admitted here with an EF back down to 35 to 40% in the setting of influenza.  His BNP was 618 -Still has some volume on him, continue IV Lasix 40 mg twice daily.  Renal function is normal -GDMT: Carvedilol 12.5mg  BID, Hydralazine 50mg  TID >>stop PTA Lisinopril HCT and start Losartan 50mg  daily and spiro 12.5mg  daily.  Starting Jardiance 10 mg daily. Can uptitrate  -transition Losartan to Entresto 49-51mg  BID at discharge (if feasible) once he has been off ACE I for at least 36 hours  HTN -Holding his home amlodipine to give room for GDMT  Can plan to continue IV Lasix over the weekend and consider transition to oral on Monday.  Cardiology will follow peripherally.  For questions or updates, please contact [facility] Please consult [email or web address] for contact info under        Signed, Maisie Fus, MD  04/11/2023, 8:56 AM

## 2023-04-11 NOTE — Assessment & Plan Note (Signed)
 Uncontrolled hyperglycemia.  Fasting glucose this am 228 mg/dl   Plan to continue insulin sliding scale for glucose cover and monitoring.  Basal insulin.  Discontinue steroids.

## 2023-04-11 NOTE — Assessment & Plan Note (Signed)
No chest pain, no acute coronary syndrome.  

## 2023-04-11 NOTE — Progress Notes (Addendum)
 Progress Note   [name] ZOX:[id number] DOB: 03-22-1934 DOA: 04/08/2023     2 DOS: the patient was seen and examined on 04/11/2023   Brief hospital course: [name] was admitted to the hospital with the working diagnosis of acute on chronic systolic heart failure exacerbation in the setting of acute influenza infection.   89 year old male with history of heart failure, insulin-dependent diabetes type 2, hypertension, hyperlipidemia, BPH, anxiety who presented from home with complaint of shortness of breath, cough.  Reported dyspnea on minimal exertion, weakness, and subjective fever at home.  EMS found him hypoxic in the range of 88% on room air.  On presentation, he had mild grade fever, required 4 L of oxygen per minute via nasal cannula.   Lab work showed potassium 3.1, creatinine 1.2, elevated BNP of 618.  Influenza A found to be positive.   Chest x-ray showed a large cardiac silhouette, increased pulmonary vasculature with patchy bilateral airspace disease.   Patient was placed on furosemide for diuresis and Oseltamivir for influenza.   Echocardiogram with reduced LV systolic function.   03/01 recovering from heart failure and influenza.   Assessment and Plan: * Acute on chronic systolic CHF (congestive heart failure) (HCC) Echocardiogram with reduced LV systolic function with EF 35 to 40%, global hypokinesis, mild dilatated internal cavity, RV systolic function preserved, RV with moderate dilatation, LA with mild dilatation, no significant valvular disease.   Documented urine output is 750 ml Systolic blood pressure 140 mmHg range.   Clinically volume status is improving.  Plan to continue furosemide 40 mg IV bid Empagliflozin, carvedilol, spironolactone and losartan.   Acute hypoxemic respiratory failure due to acute cardiogenic pulmonary edema.  Improving with diuresis,  02 saturation today is 95% on room air at rest.   Influenza A Continue supportive  medical therapy including antiviral with Oseltamivir.  Discontinue systemic corticosteroids.   Essential hypertension Continue blood pressure control with carvedilol, hydralazine, spironolactone and losartan.  Possible transition to [facility].    Hypokalemia Renal function with serum cr at 1.16 with K at 3,0 and serum bicarbonate at 28  Na 135   Plan to continue K correction with Kcl 40 meq x doses.  Follow up renal function and electrolytes.  Diuretic therapy with furosemide, spironolactone and empagliflozin.   CAD (coronary artery disease) No chest pain, no acute coronary syndrome.   Type 2 diabetes mellitus with vascular disease (HCC) Uncontrolled hyperglycemia.  Fasting glucose this am 228 mg/dl   Plan to continue insulin sliding scale for glucose cover and monitoring.  Basal insulin.  Discontinue steroids.   BPH with obstruction/lower urinary tract symptoms Continue with tamsulosin.   Anxiety Continue with escitalopram.   Acute metabolic encephalopathy has resolved.  EEG negative for seizures.  Continue supportive medical therapy, PT and OT.   Obesity, class 1 Calculated BMI is 31.0        Subjective: Patient is feeling better, dyspnea is improving, no PND or orthopnea.  Continue very weak and deconditioned   Physical Exam: Vitals:   04/10/23 2312 04/11/23 0327 04/11/23 0500 04/11/23 1125  BP: 133/66 (!) 144/98  (!) 152/74  Pulse: (!) 53 76  (!) 110  Resp: 18 18    Temp: 98.1 F (36.7 C) 97.6 F (36.4 C)  98.2 F (36.8 C)  TempSrc: Oral     SpO2: 100% 92%  95%  Weight:   103.8 kg   Height:       Neurology awake and alert, deconditioned ENT  with mild pallor with no icterus Cardiovascular with S1 and S2 present and regular with no gallops, rubs or murmurs Respiratory with no wheezing or rhonchi, mild rales at bases Abdomen with no distention  No lower extremity edema   Data Reviewed:    Family Communication: no family at the bedside    Disposition: Status is: Inpatient Remains inpatient appropriate because: Recovering heart failure  Planned Discharge Destination: Home   Author: Coralie Keens, MD 04/11/2023 2:43 PM  For on call review [email or web address].

## 2023-04-11 NOTE — Assessment & Plan Note (Addendum)
 Continue supportive medical therapy including antiviral with Oseltamivir.  Discontinue systemic corticosteroids.

## 2023-04-11 NOTE — Assessment & Plan Note (Signed)
 Continue with escitalopram.   Acute metabolic encephalopathy has resolved.  EEG negative for seizures.  Continue supportive medical therapy, PT and OT.

## 2023-04-11 NOTE — Assessment & Plan Note (Addendum)
 Continue blood pressure control with carvedilol, hydralazine, spironolactone and losartan.  Possible transition to [facility].

## 2023-04-11 NOTE — Assessment & Plan Note (Signed)
 Calculated BMI is 31.0

## 2023-04-11 NOTE — Assessment & Plan Note (Addendum)
 Echocardiogram with reduced LV systolic function with EF 35 to 40%, global hypokinesis, mild dilatated internal cavity, RV systolic function preserved, RV with moderate dilatation, LA with mild dilatation, no significant valvular disease.   Documented urine output is 750 ml Systolic blood pressure 140 mmHg range.   Clinically volume status is improving.  Plan to continue furosemide 40 mg IV bid Empagliflozin, carvedilol, spironolactone and losartan.   Acute hypoxemic respiratory failure due to acute cardiogenic pulmonary edema.  Improving with diuresis,  02 saturation today is 95% on room air at rest.

## 2023-04-11 NOTE — Assessment & Plan Note (Signed)
Continue with tamsulosin 

## 2023-04-11 NOTE — Plan of Care (Signed)
 No acute events overnight. Problem: Education: Goal: Ability to describe self-care measures that may prevent or decrease complications (Diabetes Survival Skills Education) will improve Outcome: Progressing Goal: Individualized Educational Video(s) Outcome: Progressing   Problem: Coping: Goal: Ability to adjust to condition or change in health will improve Outcome: Progressing   Problem: Fluid Volume: Goal: Ability to maintain a balanced intake and output will improve Outcome: Progressing   Problem: Health Behavior/Discharge Planning: Goal: Ability to identify and utilize available resources and services will improve Outcome: Progressing Goal: Ability to manage health-related needs will improve Outcome: Progressing   Problem: Metabolic: Goal: Ability to maintain appropriate glucose levels will improve Outcome: Progressing   Problem: Nutritional: Goal: Maintenance of adequate nutrition will improve Outcome: Progressing Goal: Progress toward achieving an optimal weight will improve Outcome: Progressing   Problem: Skin Integrity: Goal: Risk for impaired skin integrity will decrease Outcome: Progressing   Problem: Tissue Perfusion: Goal: Adequacy of tissue perfusion will improve Outcome: Progressing   Problem: Education: Goal: Knowledge of General Education information will improve Description: Including pain rating scale, medication(s)/side effects and non-pharmacologic comfort measures Outcome: Progressing   Problem: Health Behavior/Discharge Planning: Goal: Ability to manage health-related needs will improve Outcome: Progressing   Problem: Clinical Measurements: Goal: Ability to maintain clinical measurements within normal limits will improve Outcome: Progressing Goal: Will remain free from infection Outcome: Progressing Goal: Diagnostic test results will improve Outcome: Progressing Goal: Respiratory complications will improve Outcome: Progressing Goal:  Cardiovascular complication will be avoided Outcome: Progressing   Problem: Activity: Goal: Risk for activity intolerance will decrease Outcome: Progressing   Problem: Nutrition: Goal: Adequate nutrition will be maintained Outcome: Progressing   Problem: Coping: Goal: Level of anxiety will decrease Outcome: Progressing   Problem: Elimination: Goal: Will not experience complications related to bowel motility Outcome: Progressing Goal: Will not experience complications related to urinary retention Outcome: Progressing   Problem: Pain Managment: Goal: General experience of comfort will improve and/or be controlled Outcome: Progressing   Problem: Safety: Goal: Ability to remain free from injury will improve Outcome: Progressing   Problem: Skin Integrity: Goal: Risk for impaired skin integrity will decrease Outcome: Progressing   Problem: Education: Goal: Ability to demonstrate management of disease process will improve Outcome: Progressing Goal: Ability to verbalize understanding of medication therapies will improve Outcome: Progressing Goal: Individualized Educational Video(s) Outcome: Progressing   Problem: Activity: Goal: Capacity to carry out activities will improve Outcome: Progressing   Problem: Cardiac: Goal: Ability to achieve and maintain adequate cardiopulmonary perfusion will improve Outcome: Progressing

## 2023-04-11 NOTE — Assessment & Plan Note (Signed)
 Renal function with serum cr at 1.16 with K at 3,0 and serum bicarbonate at 28  Na 135   Plan to continue K correction with Kcl 40 meq x doses.  Follow up renal function and electrolytes.  Diuretic therapy with furosemide, spironolactone and empagliflozin.

## 2023-04-12 DIAGNOSIS — I5023 Acute on chronic systolic (congestive) heart failure: Secondary | ICD-10-CM | POA: Diagnosis not present

## 2023-04-12 LAB — BASIC METABOLIC PANEL
Anion gap: 11 (ref 5–15)
BUN: 42 mg/dL — ABNORMAL HIGH (ref 8–23)
CO2: 30 mmol/L (ref 22–32)
Calcium: 9.8 mg/dL (ref 8.9–10.3)
Chloride: 99 mmol/L (ref 98–111)
Creatinine, Ser: 1.36 mg/dL — ABNORMAL HIGH (ref 0.61–1.24)
GFR, Estimated: 51 mL/min — ABNORMAL LOW (ref >60)
Glucose, Bld: 199 mg/dL — ABNORMAL HIGH (ref 70–99)
Potassium: 3.5 mmol/L (ref 3.5–5.1)
Sodium: 140 mmol/L (ref 135–145)

## 2023-04-12 LAB — GLUCOSE, CAPILLARY
Glucose-Capillary: 191 mg/dL — ABNORMAL HIGH (ref 70–99)
Glucose-Capillary: 195 mg/dL — ABNORMAL HIGH (ref 70–99)
Glucose-Capillary: 212 mg/dL — ABNORMAL HIGH (ref 70–99)
Glucose-Capillary: 300 mg/dL — ABNORMAL HIGH (ref 70–99)

## 2023-04-12 LAB — MAGNESIUM: Magnesium: 2.1 mg/dL (ref 1.7–2.4)

## 2023-04-12 MED ORDER — INSULIN GLARGINE 100 UNIT/ML ~~LOC~~ SOLN
20.0000 [IU] | Freq: Every day | SUBCUTANEOUS | Status: DC
  Administered 2023-04-12 – 2023-04-16 (×5): 20 [IU] via SUBCUTANEOUS
  Filled 2023-04-12 (×6): qty 0.2

## 2023-04-12 NOTE — Plan of Care (Signed)

## 2023-04-12 NOTE — Progress Notes (Signed)
 PROGRESS NOTE    [name]  ZOX:[id number] DOB: 10/26/1934 DOA: 04/08/2023 PCP: Joaquim Nam, MD   Brief Narrative:  Mr. Cryer was admitted to the hospital with the working diagnosis of acute on chronic systolic heart failure exacerbation in the setting of acute influenza infection.    89 year old male with history of heart failure, insulin-dependent diabetes type 2, hypertension, hyperlipidemia, BPH, anxiety who presented from home with complaint of shortness of breath, cough.  Reported dyspnea on minimal exertion, weakness, and subjective fever at home.  EMS found him hypoxic in the range of 88% on room air.  On presentation, he had mild grade fever, required 4 L of oxygen per minute via nasal cannula.   Lab work showed potassium 3.1, creatinine 1.2, elevated BNP of 618.  Influenza A found to be positive.   Chest x-ray showed a large cardiac silhouette, increased pulmonary vasculature with patchy bilateral airspace disease.   Patient was placed on furosemide for diuresis and Oseltamivir for influenza.    Echocardiogram with reduced LV systolic function.   Assessment & Plan:   Principal Problem:   Acute on chronic systolic CHF (congestive heart failure) (HCC) Active Problems:   Influenza A   Essential hypertension   Hypokalemia   CAD (coronary artery disease)   Type 2 diabetes mellitus with vascular disease (HCC)   BPH with obstruction/lower urinary tract symptoms   Anxiety   Obesity, class 1  Acute hypoxic respiratory failure secondary to acute on chronic combined systolic and CHF (congestive heart failure) (HCC), POA: Echocardiogram with reduced LV systolic function with EF 35 to 40%, global hypokinesis, grade 1 diastolic dysfunction.  Chest x-ray also with pulmonary edema.  Cardiology following.  Patient started on Lasix IV 40 mg twice daily.  Slight bump in creatinine however cardiology recommends continuing Lasix as well as continuing Jardiance, Aldactone, Coreg and  losartan.  Patient has been weaned to room air.   Influenza A Symptoms improving, continue supportive medical therapy with Oseltamivir.    Essential hypertension Blood pressure very well-controlled, I will continue Coreg and hydralazine but will hold losartan to prevent further renal insult.   Renal insufficiency: Upon extensive chart review, it appears that patient's baseline creatinine fluctuates but mostly ranges between 1-1.3.  Currently jumped slightly to 1.36.  Cardiology recommends continuing Jardiance, Aldactone and Lasix however I will hold losartan for now to prevent further renal injury.   Hypokalemia Resolved.  But at borderline low.  Will replenish since he is on Lasix to prevent hypokalemia.   CAD (coronary artery disease) No chest pain, no acute coronary syndrome.    Type 2 diabetes mellitus with vascular disease (HCC) Uncontrolled hyperglycemia.  Steroids discontinued.  Blood sugar slightly improved but still elevated.  Patient on 16 units of Semglee at night and SSI.  Will increase Semglee to 20 units.   BPH with obstruction/lower urinary tract symptoms Continue with tamsulosin.    Anxiety Continue with escitalopram.    Acute metabolic encephalopathy has resolved.  EEG negative for seizures.  Continue supportive medical therapy, PT and OT.    Obesity, class 1 Calculated BMI is 31.0, weight loss and diet modification counseled.  DVT prophylaxis: enoxaparin (LOVENOX) injection 40 mg Start: 04/09/23 1000   Code Status: Full Code  Family Communication:  None present at bedside.  Plan of care discussed with patient in length and he/she verbalized understanding and agreed with it.  Status is: Inpatient Remains inpatient appropriate because: Rising creatinine   Estimated body mass index  is 30.71 kg/m as calculated from the following:   Height as of this encounter: 6' (1.829 m).   Weight as of this encounter: 102.7 kg.    Nutritional Assessment: Body mass index  is 30.71 kg/m.Marland Kitchen Seen by dietician.  I agree with the assessment and plan as outlined below: Nutrition Status:        . Skin Assessment: I have examined the patient's skin and I agree with the wound assessment as performed by the wound care RN as outlined below:    Consultants:  Cardiology  Procedures:  None  Antimicrobials:  Anti-infectives (From admission, onward)    Start     Dose/Rate Route Frequency Ordered Stop   04/09/23 1000  oseltamivir (TAMIFLU) capsule 75 mg  Status:  Discontinued        75 mg Oral 2 times daily 04/09/23 0107 04/09/23 0111   04/09/23 1000  oseltamivir (TAMIFLU) capsule 30 mg        30 mg Oral 2 times daily 04/09/23 0111 04/13/23 2159   04/08/23 2345  oseltamivir (TAMIFLU) capsule 75 mg        75 mg Oral  Once 04/08/23 2338 04/09/23 0010         Subjective: Patient seen and examined.  Although he denies any complaint but appears to be very weak and coughing.  Objective: Vitals:   04/11/23 1125 04/11/23 2024 04/12/23 0125 04/12/23 0638  BP: (!) 152/74 (!) 144/67  (!) 172/76  Pulse: (!) 110 (!) 47  (!) 103  Resp:  18  18  Temp: 98.2 F (36.8 C) 97.8 F (36.6 C)  97.6 F (36.4 C)  TempSrc:      SpO2: 95% 95%  93%  Weight:   102.7 kg   Height:        Intake/Output Summary (Last 24 hours) at 04/12/2023 0858 Last data filed at 04/12/2023 0643 Gross per 24 hour  Intake 980 ml  Output 3275 ml  Net -2295 ml   Filed Weights   04/10/23 0500 04/11/23 0500 04/12/23 0125  Weight: 102.7 kg 103.8 kg 102.7 kg    Examination:  General exam: Appears calm and comfortable  Respiratory system: Clear to auscultation. Respiratory effort normal. Cardiovascular system: S1 & S2 heard, RRR. No JVD, murmurs, rubs, gallops or clicks. No pedal edema. Gastrointestinal system: Abdomen is nondistended, soft and nontender. No organomegaly or masses felt. Normal bowel sounds heard. Central nervous system: Alert and oriented. No focal neurological  deficits. Extremities: Symmetric 5 x 5 power. Skin: No rashes, lesions or ulcers Psychiatry: Judgement and insight appear normal. Mood & affect appropriate.    Data Reviewed: I have personally reviewed following labs and imaging studies  CBC: Recent Labs  Lab 04/08/23 2225 04/09/23 0532 04/10/23 0526 04/11/23 0710  WBC 9.8 8.6 7.2 13.6*  NEUTROABS 7.5  --   --   --   HGB 12.2* 13.2 13.6 13.0  HCT 36.7* 40.2 43.3 40.5  MCV 90.6 91.4 93.5 91.4  PLT 236 263 266 275   Basic Metabolic Panel: Recent Labs  Lab 04/08/23 2225 04/09/23 0532 04/10/23 0526 04/11/23 0710 04/12/23 0628  NA 132* 132* 135 135 140  K 3.1* 3.5 3.6 3.0* 3.5  CL 97* 97* 96* 95* 99  CO2 24 25 27 28 30   GLUCOSE 173* 213* 272* 228* 199*  BUN 19 19 28* 37* 42*  CREATININE 1.20 1.18 1.23 1.16 1.36*  CALCIUM 9.5 9.3 9.6 9.4 9.8  MG  --  1.8  --   --  2.1   GFR: Estimated Creatinine Clearance: 48.3 mL/min (A) (by C-G formula based on SCr of 1.36 mg/dL (H)). Liver Function Tests: Recent Labs  Lab 04/08/23 2225  AST 14*  ALT 15  ALKPHOS 50  BILITOT 1.0  PROT 6.4*  ALBUMIN 3.6   No results for input(s): "LIPASE", "AMYLASE" in the last 168 hours. No results for input(s): "AMMONIA" in the last 168 hours. Coagulation Profile: Recent Labs  Lab 04/08/23 2225  INR 1.1   Cardiac Enzymes: No results for input(s): "CKTOTAL", "CKMB", "CKMBINDEX", "TROPONINI" in the last 168 hours. BNP (last 3 results) No results for input(s): "PROBNP" in the last 8760 hours. HbA1C: No results for input(s): "HGBA1C" in the last 72 hours. CBG: Recent Labs  Lab 04/11/23 0743 04/11/23 1202 04/11/23 1552 04/11/23 2027 04/12/23 0823  GLUCAP 225* 242* 239* 241* 191*   Lipid Profile: No results for input(s): "CHOL", "HDL", "LDLCALC", "TRIG", "CHOLHDL", "LDLDIRECT" in the last 72 hours. Thyroid Function Tests: No results for input(s): "TSH", "T4TOTAL", "FREET4", "T3FREE", "THYROIDAB" in the last 72 hours. Anemia  Panel: No results for input(s): "VITAMINB12", "FOLATE", "FERRITIN", "TIBC", "IRON", "RETICCTPCT" in the last 72 hours. Sepsis Labs: Recent Labs  Lab 04/08/23 2234  LATICACIDVEN 0.7    Recent Results (from the past 240 hours)  Blood Culture (routine x 2)     Status: None (Preliminary result)   Collection Time: 04/08/23 10:27 PM   Specimen: BLOOD RIGHT ARM  Result Value Ref Range Status   Specimen Description   Final    BLOOD RIGHT ARM Performed at [facility], [address] [place], [place] [address]    Special Requests   Final    BOTTLES DRAWN AEROBIC AND ANAEROBIC Blood Culture adequate volume Performed at [facility], [address], [place], [place] [address]    Culture   Final    NO GROWTH 3 DAYS Performed at [facility], [address], [place], [place] [address]    Report Status PENDING  Incomplete  Resp panel by RT-PCR (RSV, Flu A&B, Covid) Anterior Nasal Swab     Status: Abnormal   Collection Time: 04/08/23 10:50 PM   Specimen: Anterior Nasal Swab  Result Value Ref Range Status   SARS Coronavirus 2 by RT PCR NEGATIVE NEGATIVE Final    Comment: (NOTE) SARS-CoV-2 target nucleic acids are NOT DETECTED.  The SARS-CoV-2 RNA is generally detectable in upper respiratory specimens during the acute phase of infection. The lowest concentration of SARS-CoV-2 viral copies this assay can detect is 138 copies/mL. A negative result does not preclude SARS-Cov-2 infection and should not be used as the sole basis for treatment or other patient management decisions. A negative result may occur with  improper specimen collection/handling, submission of specimen other than nasopharyngeal swab, presence of viral mutation(s) within the areas targeted by this assay, and inadequate number of viral copies(<138 copies/mL). A negative result must be combined with clinical observations, patient history, and epidemiological information. The  expected result is Negative.  Fact Sheet for Patients:  BloggerCourse.com  Fact Sheet for Healthcare Providers:  SeriousBroker.it  This test is no t yet approved or cleared by the [place] FDA and  has been authorized for detection and/or diagnosis of SARS-CoV-2 by FDA under an Emergency Use Authorization (EUA). This EUA will remain  in effect (meaning this test can be used) for the duration of the COVID-19 declaration under Section 564(b)(1) of the Act, 21 U.S.C.section 360bbb-3(b)(1), unless the authorization is terminated  or revoked sooner.       Influenza A by PCR POSITIVE (A) NEGATIVE Final   Influenza B by PCR NEGATIVE NEGATIVE Final    Comment: (NOTE) The Xpert Xpress SARS-CoV-2/FLU/RSV plus assay is intended as an aid in the diagnosis of influenza from Nasopharyngeal swab specimens and should not be used as a sole basis for treatment. Nasal washings and aspirates are unacceptable for Xpert Xpress SARS-CoV-2/FLU/RSV testing.  Fact Sheet for Patients: BloggerCourse.com  Fact Sheet for Healthcare Providers: SeriousBroker.it  This test is not yet approved or cleared by the [place] FDA and has been authorized for detection and/or diagnosis of SARS-CoV-2 by FDA under an Emergency Use Authorization (EUA). This EUA will remain in effect (meaning this test can be used) for the duration of the COVID-19 declaration under Section 564(b)(1) of the Act, 21 U.S.C. section 360bbb-3(b)(1), unless the authorization is terminated or revoked.     Resp Syncytial Virus by PCR NEGATIVE NEGATIVE Final    Comment: (NOTE) Fact Sheet for Patients: BloggerCourse.com  Fact Sheet for Healthcare Providers: SeriousBroker.it  This test is not yet approved or cleared by the [place] FDA and has been authorized for detection and/or  diagnosis of SARS-CoV-2 by FDA under an Emergency Use Authorization (EUA). This EUA will remain in effect (meaning this test can be used) for the duration of the COVID-19 declaration under Section 564(b)(1) of the Act, 21 U.S.C. section 360bbb-3(b)(1), unless the authorization is terminated or revoked.  Performed at [facility], [address], [place], [place] [address]      Radiology Studies: EEG adult Result Date: 04/10/2023 Charlsie Quest, MD     04/10/2023  6:26 PM Patient Name: [name] MRN: [id number] Epilepsy Attending: Charlsie Quest Referring Physician/Provider: Burnadette Pop, MD Date: 04/10/2023 Duration: 24.15 mins Patient history: 89 yo male patient had sudden onset of jerky movements in all his extremities this morning. EEG to evaluate for seizure Level of alertness: Awake AEDs during EEG study: Ativan Technical aspects: This EEG study was done with scalp electrodes positioned according to the 10-20 International system of electrode placement. Electrical activity was reviewed with band pass filter of 1-70Hz , sensitivity of 7 uV/mm, display speed of 42mm/sec with a 60Hz  notched filter applied as appropriate. EEG data were recorded continuously and digitally stored.  Video monitoring was available and reviewed as appropriate. Description: The posterior dominant rhythm consists of 9 Hz activity of moderate voltage (25-35 uV) seen predominantly in posterior head regions, symmetric and reactive to eye opening and eye closing. Hyperventilation and photic stimulation were not performed.   IMPRESSION: This study is within normal limits. No seizures or epileptiform discharges were seen throughout the recording. A normal interictal EEG does not exclude  the diagnosis of epilepsy. Priyanka Annabelle Harman    Scheduled Meds:  aspirin EC  81 mg Oral QPM   carvedilol  12.5 mg Oral BID WC   empagliflozin  10 mg Oral Daily   enoxaparin (LOVENOX) injection  40 mg Subcutaneous  Q24H   escitalopram  20 mg Oral Daily   furosemide  40 mg Intravenous Q12H   hydrALAZINE  50 mg Oral TID   insulin aspart  0-20 Units Subcutaneous TID WC   insulin aspart  0-5 Units Subcutaneous QHS   insulin glargine-yfgn  16 Units Subcutaneous QHS   oseltamivir  30 mg Oral BID   sodium chloride flush  3 mL Intravenous Q12H   spironolactone  12.5 mg Oral Daily   tamsulosin  0.4 mg Oral QPC supper   Continuous Infusions:   LOS: 3 days   Hughie Closs, MD [facility]  04/12/2023, 8:58 AM   *Please note that this is a verbal dictation therefore any spelling or grammatical errors are due to the "Dragon Medical One" system interpretation.  Please page via Amion and do not message via secure chat for urgent patient care matters. Secure chat can be used for non urgent patient care matters.  How to contact the [facility] Attending or Consulting provider 7A - 7P or covering provider during after hours 7P -7A, for this patient?  Check the care team in [facility] and look for a) attending/consulting TR[facility] provider listed and b) the [facility] team listed. Page or secure chat 7A-7P. Log into [email or web address] and use [facility] universal password to access. If you do not have the password, please contact the hospital operator. Locate the [facility] provider you are looking for under [facility] and page to a number that you can be directly reached. If you still have difficulty reaching the provider, please page the [facility] (Director on Call) for the Hospitalists listed on amion for assistance.

## 2023-04-12 NOTE — Plan of Care (Signed)
 Crt bumped today, will continue this regimen today and can plan to consider transitioning to oral Monday or Tuesday. Otherwise no changes from cardiology

## 2023-04-12 NOTE — Telephone Encounter (Signed)
 Noted. Thanks.

## 2023-04-13 ENCOUNTER — Ambulatory Visit: Payer: Medicare Other | Admitting: Family Medicine

## 2023-04-13 DIAGNOSIS — I5023 Acute on chronic systolic (congestive) heart failure: Secondary | ICD-10-CM | POA: Diagnosis not present

## 2023-04-13 LAB — CBC WITH DIFFERENTIAL/PLATELET
Abs Immature Granulocytes: 0.11 10*3/uL — ABNORMAL HIGH (ref 0.00–0.07)
Basophils Absolute: 0 10*3/uL (ref 0.0–0.1)
Basophils Relative: 0 %
Eosinophils Absolute: 0.1 10*3/uL (ref 0.0–0.5)
Eosinophils Relative: 1 %
HCT: 50.2 % (ref 39.0–52.0)
Hemoglobin: 16 g/dL (ref 13.0–17.0)
Immature Granulocytes: 1 %
Lymphocytes Relative: 9 %
Lymphs Abs: 1.1 10*3/uL (ref 0.7–4.0)
MCH: 29.9 pg (ref 26.0–34.0)
MCHC: 31.9 g/dL (ref 30.0–36.0)
MCV: 93.7 fL (ref 80.0–100.0)
Monocytes Absolute: 0.7 10*3/uL (ref 0.1–1.0)
Monocytes Relative: 6 %
Neutro Abs: 10.2 10*3/uL — ABNORMAL HIGH (ref 1.7–7.7)
Neutrophils Relative %: 83 %
Platelets: 302 10*3/uL (ref 150–400)
RBC: 5.36 MIL/uL (ref 4.22–5.81)
RDW: 14.2 % (ref 11.5–15.5)
WBC: 12.2 10*3/uL — ABNORMAL HIGH (ref 4.0–10.5)
nRBC: 0 % (ref 0.0–0.2)

## 2023-04-13 LAB — BASIC METABOLIC PANEL
Anion gap: 12 (ref 5–15)
BUN: 44 mg/dL — ABNORMAL HIGH (ref 8–23)
CO2: 31 mmol/L (ref 22–32)
Calcium: 9.5 mg/dL (ref 8.9–10.3)
Chloride: 93 mmol/L — ABNORMAL LOW (ref 98–111)
Creatinine, Ser: 1.71 mg/dL — ABNORMAL HIGH (ref 0.61–1.24)
GFR, Estimated: 39 mL/min — ABNORMAL LOW (ref >60)
Glucose, Bld: 229 mg/dL — ABNORMAL HIGH (ref 70–99)
Potassium: 3.3 mmol/L — ABNORMAL LOW (ref 3.5–5.1)
Sodium: 136 mmol/L (ref 135–145)

## 2023-04-13 LAB — GLUCOSE, CAPILLARY
Glucose-Capillary: 226 mg/dL — ABNORMAL HIGH (ref 70–99)
Glucose-Capillary: 207 mg/dL — ABNORMAL HIGH (ref 70–99)
Glucose-Capillary: 209 mg/dL — ABNORMAL HIGH (ref 70–99)
Glucose-Capillary: 229 mg/dL — ABNORMAL HIGH (ref 70–99)

## 2023-04-13 MED ORDER — SODIUM CHLORIDE 0.9 % IV SOLN
12.5000 mg | Freq: Once | INTRAVENOUS | Status: AC
  Administered 2023-04-13: 12.5 mg via INTRAVENOUS
  Filled 2023-04-13: qty 12.5

## 2023-04-13 MED ORDER — LOPERAMIDE HCL 2 MG PO CAPS
2.0000 mg | ORAL_CAPSULE | Freq: Once | ORAL | Status: AC
  Administered 2023-04-13: 2 mg via ORAL
  Filled 2023-04-13: qty 1

## 2023-04-13 MED ORDER — POTASSIUM CHLORIDE 10 MEQ/100ML IV SOLN
10.0000 meq | INTRAVENOUS | Status: AC
  Administered 2023-04-13 (×4): 10 meq via INTRAVENOUS
  Filled 2023-04-13 (×4): qty 100

## 2023-04-13 MED ORDER — POTASSIUM CHLORIDE CRYS ER 20 MEQ PO TBCR: 40.0000 meq | EXTENDED_RELEASE_TABLET | ORAL | Status: AC

## 2023-04-13 MED ORDER — SODIUM CHLORIDE 0.9 % IV SOLN: INTRAVENOUS | Status: AC

## 2023-04-13 NOTE — Progress Notes (Signed)
   Patient Name: [name] Date of Encounter: 04/13/2023 [facility] Cardiologist: Donato Schultz, MD   Interval Summary  .    He denies any shortness of breath, but woke up this morning with abdominal discomfort, then had nausea and vomiting and then had 1 episode of diarrhea.  Still has some epigastric discomfort, but not as bad.  Net diuresis 6.7 L since admission weight down 4 kg from maximum weight on this admission. Able to lie fully flat in bed without dyspnea.  Vital Signs .    Vitals:   04/12/23 0638 04/12/23 2003 04/12/23 2315 04/13/23 0630  BP: (!) 172/76 138/67 139/65 (!) 148/83  Pulse: (!) 103 (!) 45 (!) 49 (!) 48  Resp: 18 20  16   Temp: 97.6 F (36.4 C) 98.2 F (36.8 C)  97.9 F (36.6 C)  TempSrc:  Oral    SpO2: 93% 94%  92%  Weight:    99.7 kg  Height:        Intake/Output Summary (Last 24 hours) at 04/13/2023 1148 Last data filed at 04/13/2023 0641 Gross per 24 hour  Intake 240 ml  Output 3030 ml  Net -2790 ml      04/13/2023    6:30 AM 04/12/2023    1:25 AM 04/11/2023    5:00 AM  Last 3 Weights  Weight (lbs) 219 lb 12.8 oz 226 lb 6.6 oz 228 lb 13.4 oz  Weight (kg) 99.7 kg 102.7 kg 103.8 kg      Telemetry/ECG    Sinus rhythm with very frequent PACs and brief bursts of tachycardia, both narrow complex and wide-complex, mostly 3-4 beats long.  Maybe had an episode of atrial fibrillation around 7 AM today, but hard to say due to artifact- Personally Reviewed  Physical Exam .   GEN: No acute distress.  He is lying supine in bed without any respiratory difficulty. Neck: No JVD Cardiac: Baseline RRR with very frequent ectopy, no murmurs, rubs, or gallops.  Respiratory: Clear to auscultation bilaterally. GI: Soft, minimally tender in epigastrium, non-distended  MS: No edema  Assessment & Plan .     He is no longer short of breath, has no peripheral findings to suggest hypervolemia, creatinine has increased substantially.  He may indeed be somewhat  hypovolemic. Has had nausea and vomiting as well as diarrhea. Based on clinic appointment weights his "dry weight" is probably 220-225 pounds.  Today he weighed 220 pounds. Stop diuretics until we see improving trend in renal parameters.  Agree with placing ARB and Jardiance on hold as well.  Replace potassium.  For questions or updates, please contact [facility] Please consult [email or web address] for contact info under        Signed, Thurmon Fair, MD

## 2023-04-13 NOTE — Plan of Care (Signed)
  Problem: Education: Goal: Knowledge of General Education information will improve Description: Including pain rating scale, medication(s)/side effects and non-pharmacologic comfort measures Outcome: Progressing   Problem: Activity: Goal: Risk for activity intolerance will decrease Outcome: Progressing   Problem: Coping: Goal: Level of anxiety will decrease Outcome: Progressing   Problem: Elimination: Goal: Will not experience complications related to bowel motility Outcome: Progressing   Problem: Safety: Goal: Ability to remain free from injury will improve Outcome: Progressing   Problem: Skin Integrity: Goal: Risk for impaired skin integrity will decrease Outcome: Progressing   

## 2023-04-13 NOTE — Progress Notes (Signed)
 OT Cancellation Note  Patient Details Name: [name] MRN: [id number] DOB: June 03, 1934   Cancelled Treatment:    Reason Eval/Treat Not Completed: Fatigue/lethargy limiting ability to participate OT attempted visit this day. Nursing reports pt was feeling nauseous with emesis this am. OT checked in with pt and he requested to rest and for OT to check back later due to nausea. OT will continue to follow.   Vicenta Dunning 04/13/2023, 1:59 PM

## 2023-04-13 NOTE — Plan of Care (Signed)

## 2023-04-13 NOTE — Plan of Care (Signed)

## 2023-04-13 NOTE — Progress Notes (Signed)
 PROGRESS NOTE    JHOEL STIEG  ZOX:[id number] DOB: 1934-06-01 DOA: 04/08/2023 PCP: Joaquim Nam, MD   Brief Narrative:  Mr. Cottingham was admitted to the hospital with the working diagnosis of acute on chronic systolic heart failure exacerbation in the setting of acute influenza infection.    89 year old male with history of heart failure, insulin-dependent diabetes type 2, hypertension, hyperlipidemia, BPH, anxiety who presented from home with complaint of shortness of breath, cough.  Reported dyspnea on minimal exertion, weakness, and subjective fever at home.  EMS found him hypoxic in the range of 88% on room air.  On presentation, he had mild grade fever, required 4 L of oxygen per minute via nasal cannula.   Lab work showed potassium 3.1, creatinine 1.2, elevated BNP of 618.  Influenza A found to be positive.   Chest x-ray showed a large cardiac silhouette, increased pulmonary vasculature with patchy bilateral airspace disease.   Patient was placed on furosemide for diuresis and Oseltamivir for influenza.    Echocardiogram with reduced LV systolic function.   Assessment & Plan:   Principal Problem:   Acute on chronic systolic CHF (congestive heart failure) (HCC) Active Problems:   Influenza A   Essential hypertension   Hypokalemia   CAD (coronary artery disease)   Type 2 diabetes mellitus with vascular disease (HCC)   BPH with obstruction/lower urinary tract symptoms   Anxiety   Obesity, class 1  Acute hypoxic respiratory failure secondary to acute on chronic combined systolic and CHF (congestive heart failure) (HCC), POA: Echocardiogram with reduced LV systolic function with EF 35 to 40%, global hypokinesis, grade 1 diastolic dysfunction.  Chest x-ray also with pulmonary edema.  Cardiology following.  Patient started on Lasix IV 40 mg twice daily.  Slight bump in creatinine however cardiology recommends continuing Lasix as well as continuing Jardiance, Aldactone, Coreg and  losartan.  Patient has been weaned to room air.  He actually appears dry to me.  I will gently hydrate him today.   Influenza A Symptoms improving, continue supportive medical therapy with Oseltamivir.    Essential hypertension Blood pressure very well-controlled, I will continue Coreg and hydralazine but will hold losartan to prevent further renal insult.   Nausea vomiting/diarrhea: He is complaining of having 2 episodes of vomiting and diarrhea this morning.  Could not finish his breakfast.  Appears clinically dehydrated further approved by rising creatinine.  Will gently hydrate him.  Treat symptomatically.  AKI: Upon extensive chart review, it appears that patient's baseline creatinine fluctuates but mostly ranges between 1-1.3.  Currently jumped slightly to 1.36 yesterday but jumped further to 1.71 today.  Cardiology yesterday recommended continuing Jardiance, Aldactone and Lasix however I stopped patient's losartan.  Patient has already received Jardiance before his labs came back.  I have now discontinued his Lasix as well as Aldactone and Jardiance as well.   Hypokalemia Low again, will replenish.   CAD (coronary artery disease) No chest pain, no acute coronary syndrome.    Type 2 diabetes mellitus with vascular disease (HCC) Uncontrolled hyperglycemia.  Steroids discontinued.  Blood sugar slightly improved but still elevated.  Patient on 16 units of Semglee at night and SSI.  Will increase Semglee to 20 units.   BPH with obstruction/lower urinary tract symptoms Continue with tamsulosin.    Anxiety Continue with escitalopram.    Acute metabolic encephalopathy has resolved.  EEG negative for seizures.  Continue supportive medical therapy, PT and OT.    Obesity, class 1 Calculated BMI  is 31.0, weight loss and diet modification counseled.  DVT prophylaxis: enoxaparin (LOVENOX) injection 40 mg Start: 04/09/23 1000   Code Status: Full Code  Family Communication: Son present at  bedside.  Plan of care discussed with patient in length and he/she verbalized understanding and agreed with it.  Status is: Inpatient Remains inpatient appropriate because: Rising creatinine   Estimated body mass index is 29.81 kg/m as calculated from the following:   Height as of this encounter: 6' (1.829 m).   Weight as of this encounter: 99.7 kg.    Nutritional Assessment: Body mass index is 29.81 kg/m.Marland Kitchen Seen by dietician.  I agree with the assessment and plan as outlined below: Nutrition Status:        . Skin Assessment: I have examined the patient's skin and I agree with the wound assessment as performed by the wound care RN as outlined below:    Consultants:  Cardiology  Procedures:  None  Antimicrobials:  Anti-infectives (From admission, onward)    Start     Dose/Rate Route Frequency Ordered Stop   04/09/23 1000  oseltamivir (TAMIFLU) capsule 75 mg  Status:  Discontinued        75 mg Oral 2 times daily 04/09/23 0107 04/09/23 0111   04/09/23 1000  oseltamivir (TAMIFLU) capsule 30 mg        30 mg Oral 2 times daily 04/09/23 0111 04/13/23 0858   04/08/23 2345  oseltamivir (TAMIFLU) capsule 75 mg        75 mg Oral  Once 04/08/23 2338 04/09/23 0010         Subjective: Patient seen and examined.  Son at the bedside.  Patient complains of nausea and vomiting as well as diarrhea.  No other complaint.  No shortness of breath.  Objective: Vitals:   04/12/23 0638 04/12/23 2003 04/12/23 2315 04/13/23 0630  BP: (!) 172/76 138/67 139/65 (!) 148/83  Pulse: (!) 103 (!) 45 (!) 49 (!) 48  Resp: 18 20  16   Temp: 97.6 F (36.4 C) 98.2 F (36.8 C)  97.9 F (36.6 C)  TempSrc:  Oral    SpO2: 93% 94%  92%  Weight:    99.7 kg  Height:        Intake/Output Summary (Last 24 hours) at 04/13/2023 1037 Last data filed at 04/13/2023 0641 Gross per 24 hour  Intake 240 ml  Output 3030 ml  Net -2790 ml   Filed Weights   04/11/23 0500 04/12/23 0125 04/13/23 0630  Weight:  103.8 kg 102.7 kg 99.7 kg    Examination:  General exam: Appears calm and comfortable  Respiratory system: Clear to auscultation. Respiratory effort normal. Cardiovascular system: S1 & S2 heard, RRR. No JVD, murmurs, rubs, gallops or clicks. No pedal edema. Gastrointestinal system: Abdomen is nondistended, soft and nontender. No organomegaly or masses felt. Normal bowel sounds heard. Central nervous system: Alert and oriented. No focal neurological deficits. Extremities: Symmetric 5 x 5 power. Skin: No rashes, lesions or ulcers.  Psychiatry: Judgement and insight appear normal. Mood & affect appropriate.   Data Reviewed: I have personally reviewed following labs and imaging studies  CBC: Recent Labs  Lab 04/08/23 2225 04/09/23 0532 04/10/23 0526 04/11/23 0710 04/13/23 0953  WBC 9.8 8.6 7.2 13.6* 12.2*  NEUTROABS 7.5  --   --   --  10.2*  HGB 12.2* 13.2 13.6 13.0 16.0  HCT 36.7* 40.2 43.3 40.5 50.2  MCV 90.6 91.4 93.5 91.4 93.7  PLT 236 263 266 275 302  Basic Metabolic Panel: Recent Labs  Lab 04/09/23 0532 04/10/23 0526 04/11/23 0710 04/12/23 0628 04/13/23 0953  NA 132* 135 135 140 136  K 3.5 3.6 3.0* 3.5 3.3*  CL 97* 96* 95* 99 93*  CO2 25 27 28 30 31   GLUCOSE 213* 272* 228* 199* 229*  BUN 19 28* 37* 42* 44*  CREATININE 1.18 1.23 1.16 1.36* 1.71*  CALCIUM 9.3 9.6 9.4 9.8 9.5  MG 1.8  --   --  2.1  --    GFR: Estimated Creatinine Clearance: 37.9 mL/min (A) (by C-G formula based on SCr of 1.71 mg/dL (H)). Liver Function Tests: Recent Labs  Lab 04/08/23 2225  AST 14*  ALT 15  ALKPHOS 50  BILITOT 1.0  PROT 6.4*  ALBUMIN 3.6   No results for input(s): "LIPASE", "AMYLASE" in the last 168 hours. No results for input(s): "AMMONIA" in the last 168 hours. Coagulation Profile: Recent Labs  Lab 04/08/23 2225  INR 1.1   Cardiac Enzymes: No results for input(s): "CKTOTAL", "CKMB", "CKMBINDEX", "TROPONINI" in the last 168 hours. BNP (last 3 results) No  results for input(s): "PROBNP" in the last 8760 hours. HbA1C: No results for input(s): "HGBA1C" in the last 72 hours. CBG: Recent Labs  Lab 04/12/23 0823 04/12/23 1131 04/12/23 1704 04/12/23 2124 04/13/23 0801  GLUCAP 191* 195* 212* 300* 226*   Lipid Profile: No results for input(s): "CHOL", "HDL", "LDLCALC", "TRIG", "CHOLHDL", "LDLDIRECT" in the last 72 hours. Thyroid Function Tests: No results for input(s): "TSH", "T4TOTAL", "FREET4", "T3FREE", "THYROIDAB" in the last 72 hours. Anemia Panel: No results for input(s): "VITAMINB12", "FOLATE", "FERRITIN", "TIBC", "IRON", "RETICCTPCT" in the last 72 hours. Sepsis Labs: Recent Labs  Lab 04/08/23 2234  LATICACIDVEN 0.7    Recent Results (from the past 240 hours)  Blood Culture (routine x 2)     Status: None (Preliminary result)   Collection Time: 04/08/23 10:27 PM   Specimen: BLOOD RIGHT ARM  Result Value Ref Range Status   Specimen Description   Final    BLOOD RIGHT ARM Performed at [facility], [address] [place], [place] [address]    Special Requests   Final    BOTTLES DRAWN AEROBIC AND ANAEROBIC Blood Culture adequate volume Performed at [facility], [address], [place], [place] [address]    Culture   Final    NO GROWTH 4 DAYS Performed at [facility], [address], [place], [place] [address]    Report Status PENDING  Incomplete  Resp panel by RT-PCR (RSV, Flu A&B, Covid) Anterior Nasal Swab     Status: Abnormal   Collection Time: 04/08/23 10:50 PM   Specimen: Anterior Nasal Swab  Result Value Ref Range Status   SARS Coronavirus 2 by RT PCR NEGATIVE NEGATIVE Final    Comment: (NOTE) SARS-CoV-2 target nucleic acids are NOT DETECTED.  The SARS-CoV-2 RNA is generally detectable in upper respiratory specimens during the acute phase of infection. The lowest concentration of SARS-CoV-2 viral copies this assay can detect is 138 copies/mL. A negative result does  not preclude SARS-Cov-2 infection and should not be used as the sole basis for treatment or other patient management decisions. A negative result may occur with  improper specimen collection/handling, submission of specimen other than nasopharyngeal swab, presence of viral mutation(s) within the areas targeted by this assay, and inadequate number of viral copies(<138 copies/mL). A negative result must be combined with clinical observations, patient history, and epidemiological information. The expected result  is Negative.  Fact Sheet for Patients:  BloggerCourse.com  Fact Sheet for Healthcare Providers:  SeriousBroker.it  This test is no t yet approved or cleared by the [place] FDA and  has been authorized for detection and/or diagnosis of SARS-CoV-2 by FDA under an Emergency Use Authorization (EUA). This EUA will remain  in effect (meaning this test can be used) for the duration of the COVID-19 declaration under Section 564(b)(1) of the Act, 21 U.S.C.section 360bbb-3(b)(1), unless the authorization is terminated  or revoked sooner.       Influenza A by PCR POSITIVE (A) NEGATIVE Final   Influenza B by PCR NEGATIVE NEGATIVE Final    Comment: (NOTE) The Xpert Xpress SARS-CoV-2/FLU/RSV plus assay is intended as an aid in the diagnosis of influenza from Nasopharyngeal swab specimens and should not be used as a sole basis for treatment. Nasal washings and aspirates are unacceptable for Xpert Xpress SARS-CoV-2/FLU/RSV testing.  Fact Sheet for Patients: BloggerCourse.com  Fact Sheet for Healthcare Providers: SeriousBroker.it  This test is not yet approved or cleared by the [place] FDA and has been authorized for detection and/or diagnosis of SARS-CoV-2 by FDA under an Emergency Use Authorization (EUA). This EUA will remain in effect (meaning this test can be used) for the  duration of the COVID-19 declaration under Section 564(b)(1) of the Act, 21 U.S.C. section 360bbb-3(b)(1), unless the authorization is terminated or revoked.     Resp Syncytial Virus by PCR NEGATIVE NEGATIVE Final    Comment: (NOTE) Fact Sheet for Patients: BloggerCourse.com  Fact Sheet for Healthcare Providers: SeriousBroker.it  This test is not yet approved or cleared by the [place] FDA and has been authorized for detection and/or diagnosis of SARS-CoV-2 by FDA under an Emergency Use Authorization (EUA). This EUA will remain in effect (meaning this test can be used) for the duration of the COVID-19 declaration under Section 564(b)(1) of the Act, 21 U.S.C. section 360bbb-3(b)(1), unless the authorization is terminated or revoked.  Performed at [facility], [address] [place], [place] [address]      Radiology Studies: No results found.   Scheduled Meds:  aspirin EC  81 mg Oral QPM   carvedilol  12.5 mg Oral BID WC   enoxaparin (LOVENOX) injection  40 mg Subcutaneous Q24H   escitalopram  20 mg Oral Daily   hydrALAZINE  50 mg Oral TID   insulin aspart  0-20 Units Subcutaneous TID WC   insulin aspart  0-5 Units Subcutaneous QHS   insulin glargine  20 Units Subcutaneous QHS   sodium chloride flush  3 mL Intravenous Q12H   tamsulosin  0.4 mg Oral QPC supper   Continuous Infusions:   LOS: 4 days   Hughie Closs, MD [facility]  04/13/2023, 10:37 AM   *Please note that this is a verbal dictation therefore any spelling or grammatical errors are due to the "Dragon Medical One" system interpretation.  Please page via Amion and do not message via secure chat for urgent patient care matters. Secure chat can be used for non urgent patient care matters.  How to contact the [facility] Attending or Consulting provider 7A - 7P or covering provider during after hours 7P -7A, for this patient?  Check  the care team in [facility] and look for a) attending/consulting TRH provider listed and b) the [facility] team listed. Page or secure chat 7A-7P. Log into [email or web address] and use [place]'s universal password to access. If you do not have the password, please contact  the hospital operator. Locate the [facility] provider you are looking for under [facility] and page to a number that you can be directly reached. If you still have difficulty reaching the provider, please page the [facility] (Director on Call) for the Hospitalists listed on amion for assistance.

## 2023-04-14 ENCOUNTER — Encounter: Payer: Self-pay | Admitting: Family Medicine

## 2023-04-14 DIAGNOSIS — I5023 Acute on chronic systolic (congestive) heart failure: Secondary | ICD-10-CM | POA: Diagnosis not present

## 2023-04-14 LAB — BASIC METABOLIC PANEL
Anion gap: 12 (ref 5–15)
BUN: 54 mg/dL — ABNORMAL HIGH (ref 8–23)
CO2: 27 mmol/L (ref 22–32)
Calcium: 8.6 mg/dL — ABNORMAL LOW (ref 8.9–10.3)
Chloride: 95 mmol/L — ABNORMAL LOW (ref 98–111)
Creatinine, Ser: 1.7 mg/dL — ABNORMAL HIGH (ref 0.61–1.24)
GFR, Estimated: 39 mL/min — ABNORMAL LOW (ref >60)
Glucose, Bld: 191 mg/dL — ABNORMAL HIGH (ref 70–99)
Potassium: 3.5 mmol/L (ref 3.5–5.1)
Sodium: 134 mmol/L — ABNORMAL LOW (ref 135–145)

## 2023-04-14 LAB — GLUCOSE, CAPILLARY
Glucose-Capillary: 176 mg/dL — ABNORMAL HIGH (ref 70–99)
Glucose-Capillary: 163 mg/dL — ABNORMAL HIGH (ref 70–99)
Glucose-Capillary: 129 mg/dL — ABNORMAL HIGH (ref 70–99)
Glucose-Capillary: 117 mg/dL — ABNORMAL HIGH (ref 70–99)

## 2023-04-14 LAB — CULTURE, BLOOD (ROUTINE X 2)
Culture: NO GROWTH
Special Requests: ADEQUATE

## 2023-04-14 MED ORDER — LOPERAMIDE HCL 2 MG PO CAPS
2.0000 mg | ORAL_CAPSULE | Freq: Once | ORAL | Status: AC
  Administered 2023-04-14: 2 mg via ORAL
  Filled 2023-04-14: qty 1

## 2023-04-14 MED ORDER — SODIUM CHLORIDE 0.9 % IV SOLN
12.5000 mg | Freq: Once | INTRAVENOUS | Status: AC
  Administered 2023-04-14: 12.5 mg via INTRAVENOUS
  Filled 2023-04-14: qty 0.5

## 2023-04-14 MED ORDER — SODIUM CHLORIDE 0.9 % IV SOLN: INTRAVENOUS | Status: AC

## 2023-04-14 NOTE — Progress Notes (Signed)
 Occupational Therapy Treatment Patient Details Name: [name] MRN: [id number] DOB: [birth year] Today's Date: 04/14/2023   History of present illness Pt is an 89 y.o. male with dx of Acute respiratory failure with hypoxia, Acute on chronic HFmrEF, and Influenza A. PMH: HFmrEF, insulin-dependent DM2, HTN, hyperlipidemia, BPH, anxiety, CAD   OT comments  Patient is progressing toward all OT goals. No pain reported and pt tolerated treatment well. OT instructed in ECT strategies during bathroom level BADL's and toilet transfer with RW and increased standing tolerance sink side. Pt continues to present with generalized weakness, activity tolerance and balance deficits. Discharge recommendation for HHOT with family S/support remains appropriate. OT will continue to follow and progress established goals and POC.       If plan is discharge home, recommend the following:  A little help with walking and/or transfers;A little help with bathing/dressing/bathroom;Assistance with cooking/housework;Direct supervision/assist for medications management;Direct supervision/assist for financial management;Assist for transportation;Help with stairs or ramp for entrance   Equipment Recommendations  None recommended by OT       Precautions / Restrictions Precautions Precautions: Fall Recall of Precautions/Restrictions: Intact Restrictions Weight Bearing Restrictions Per Provider Order: No       Mobility Bed Mobility Overal bed mobility: Modified Independent       Supine to sit: Supervision          Transfers Overall transfer level: Needs assistance Equipment used: Rolling walker (2 wheels) Transfers: Sit to/from Stand, Bed to chair/wheelchair/BSC Sit to Stand: Contact guard assist Stand pivot transfers: Contact guard assist         General transfer comment: amb to toilet with RW with CGA     Balance Overall balance assessment: Needs assistance         Standing balance support:  Bilateral upper extremity supported, Reliant on assistive device for balance, During functional activity Standing balance-Leahy Scale: Fair                             ADL either performed or assessed with clinical judgement   ADL Overall ADL's : Needs assistance/impaired Eating/Feeding: Independent   Grooming: Wash/dry hands;Wash/dry face;Oral care;Applying deodorant;Supervision/safety;Sitting;Standing   Upper Body Bathing: Supervision/ safety;Sitting   Lower Body Bathing: Contact guard assist   Upper Body Dressing : Supervision/safety;Sitting   Lower Body Dressing: Contact guard assist   Toilet Transfer: Supervision/safety;Grab bars;Rolling walker (2 wheels);Ambulation Toilet Transfer Details (indicate cue type and reason): min cues for forward weight shift         Functional mobility during ADLs: Supervision/safety General ADL Comments: decreased activity tolerance with rests needed    Extremity/Trunk Assessment Upper Extremity Assessment Upper Extremity Assessment: Overall WFL for tasks assessed   Lower Extremity Assessment Lower Extremity Assessment: Generalized weakness        Vision   Vision Assessment?: No apparent visual deficits   Perception Perception Perception: Within Functional Limits   Praxis     Communication Communication Communication: No apparent difficulties Factors Affecting Communication: Hearing impaired   Cognition Arousal: Alert Behavior During Therapy: WFL for tasks assessed/performed                                 Following commands: Intact                 General Comments no skin issues noted    Pertinent Vitals/ Pain  Pain Assessment Pain Assessment: No/denies pain   Frequency  Min 2X/week        Progress Toward Goals  OT Goals(current goals can now be found in the care plan section)  Progress towards OT goals: Progressing toward goals  Acute Rehab OT Goals Patient Stated  Goal: to go home stronger OT Goal Formulation: With patient Time For Goal Achievement: 04/24/23 Potential to Achieve Goals: Good ADL Goals Pt Will Perform Grooming: with modified independence;standing Pt Will Perform Lower Body Bathing: with supervision;sitting/lateral leans;sit to/from stand Pt Will Perform Lower Body Dressing: with supervision;sitting/lateral leans;sit to/from stand Pt Will Transfer to Toilet: with supervision;grab bars;regular height toilet;ambulating  Plan         AM-PAC OT "6 Clicks" Daily Activity     Outcome Measure   Help from another person eating meals?: None Help from another person taking care of personal grooming?: A Little Help from another person toileting, which includes using toliet, bedpan, or urinal?: A Little Help from another person bathing (including washing, rinsing, drying)?: A Little Help from another person to put on and taking off regular upper body clothing?: A Little Help from another person to put on and taking off regular lower body clothing?: A Little 6 Click Score: 19    End of Session Equipment Utilized During Treatment: Rolling walker (2 wheels);Gait belt  OT Visit Diagnosis: Unsteadiness on feet (R26.81);Muscle weakness (generalized) (M62.81)   Activity Tolerance Patient limited by fatigue   Patient Left in bed;with call bell/phone within reach;with bed alarm set   Nurse Communication Mobility status;Precautions        Time: 1422-1450 OT Time Calculation (min): 28 min  Charges: OT General Charges $OT Visit: 1 Visit OT Treatments $Self Care/Home Management : 23-37 mins  Noheli Melder OT/L Acute Rehabilitation Department  [phone]   04/14/2023, 4:16 PM

## 2023-04-14 NOTE — Progress Notes (Addendum)
 Rounding Note    Patient Name: [name] Date of Encounter: 04/14/2023   [facility] Cardiologist: Donato Schultz, MD   Subjective   Pt has no cardiac complaints today. Reports having diarrhea. No further vomiting today.   Inpatient Medications    Scheduled Meds:  aspirin EC  81 mg Oral QPM   carvedilol  12.5 mg Oral BID WC   enoxaparin (LOVENOX) injection  40 mg Subcutaneous Q24H   escitalopram  20 mg Oral Daily   hydrALAZINE  50 mg Oral TID   insulin aspart  0-20 Units Subcutaneous TID WC   insulin aspart  0-5 Units Subcutaneous QHS   insulin glargine  20 Units Subcutaneous QHS   sodium chloride flush  3 mL Intravenous Q12H   tamsulosin  0.4 mg Oral QPC supper   Continuous Infusions:  sodium chloride     PRN Meds: acetaminophen **OR** acetaminophen, guaiFENesin, ipratropium-albuterol, ondansetron **OR** ondansetron (ZOFRAN) IV, senna-docusate, traMADol   Vital Signs    Vitals:   04/13/23 0630 04/13/23 1351 04/13/23 1949 04/14/23 0501  BP: (!) 148/83 (!) 153/69 (!) 159/80 (!) 137/57  Pulse: (!) 48 64 (!) 59 72  Resp: 16 18 20 15   Temp: 97.9 F (36.6 C) 98.4 F (36.9 C) 97.9 F (36.6 C) 98.4 F (36.9 C)  TempSrc:      SpO2: 92% (!) 89% 92% 90%  Weight: 99.7 kg   99.9 kg  Height:        Intake/Output Summary (Last 24 hours) at 04/14/2023 1038 Last data filed at 04/14/2023 0533 Gross per 24 hour  Intake --  Output 150 ml  Net -150 ml      04/14/2023    5:01 AM 04/13/2023    6:30 AM 04/12/2023    1:25 AM  Last 3 Weights  Weight (lbs) 220 lb 3.8 oz 219 lb 12.8 oz 226 lb 6.6 oz  Weight (kg) 99.9 kg 99.7 kg 102.7 kg      Telemetry    Sinus rhythm with high buren of PACs, atrial bigeminy, PVCs, I did not see AFib - Personally Reviewed  ECG    No new tracings - Personally Reviewed  Physical Exam   GEN: No acute distress.   Neck: No JVD Cardiac: irregular rhythm, regular rate Respiratory: Clear to auscultation bilaterally. GI: Soft,  nontender, non-distended  MS: No edema; No deformity. Neuro:  Nonfocal  Psych: Normal affect   Labs    High Sensitivity Troponin:   Recent Labs  Lab 04/08/23 2225 04/09/23 0015  TROPONINIHS 32* 37*     Chemistry Recent Labs  Lab 04/08/23 2225 04/09/23 0532 04/10/23 0526 04/12/23 0628 04/13/23 0953 04/14/23 0603  NA 132* 132*   < > 140 136 134*  K 3.1* 3.5   < > 3.5 3.3* 3.5  CL 97* 97*   < > 99 93* 95*  CO2 24 25   < > 30 31 27   GLUCOSE 173* 213*   < > 199* 229* 191*  BUN 19 19   < > 42* 44* 54*  CREATININE 1.20 1.18   < > 1.36* 1.71* 1.70*  CALCIUM 9.5 9.3   < > 9.8 9.5 8.6*  MG  --  1.8  --  2.1  --   --   PROT 6.4*  --   --   --   --   --   ALBUMIN 3.6  --   --   --   --   --  AST 14*  --   --   --   --   --   ALT 15  --   --   --   --   --   ALKPHOS 50  --   --   --   --   --   BILITOT 1.0  --   --   --   --   --   GFRNONAA 59* >60   < > 51* 39* 39*  ANIONGAP 11 10   < > 11 12 12    < > = values in this interval not displayed.    Lipids No results for input(s): "CHOL", "TRIG", "HDL", "LABVLDL", "LDLCALC", "CHOLHDL" in the last 168 hours.  Hematology Recent Labs  Lab 04/10/23 0526 04/11/23 0710 04/13/23 0953  WBC 7.2 13.6* 12.2*  RBC 4.63 4.43 5.36  HGB 13.6 13.0 16.0  HCT 43.3 40.5 50.2  MCV 93.5 91.4 93.7  MCH 29.4 29.3 29.9  MCHC 31.4 32.1 31.9  RDW 14.1 14.2 14.2  PLT 266 275 302   Thyroid No results for input(s): "TSH", "FREET4" in the last 168 hours.  BNP Recent Labs  Lab 04/08/23 2225  BNP 618.2*    DDimer No results for input(s): "DDIMER" in the last 168 hours.   Radiology    No results found.  Cardiac Studies   Echo 04/09/23 1. Left ventricular ejection fraction, by estimation, is 35 to 40%. The  left ventricle has moderately decreased function. The left ventricle  demonstrates global hypokinesis. The left ventricular internal cavity size  was mildly dilated. Left ventricular  diastolic parameters are consistent with Grade II  diastolic dysfunction  (pseudonormalization).   2. Right ventricular systolic function is normal. The right ventricular  size is moderately enlarged. Tricuspid regurgitation signal is inadequate  for assessing PA pressure.   3. Left atrial size was mildly dilated.   4. The mitral valve is normal in structure. Trivial mitral valve  regurgitation. No evidence of mitral stenosis.   5. The aortic valve was not well visualized. Aortic valve regurgitation  is not visualized. No aortic stenosis is present.   6. The inferior vena cava is dilated in size with <50% respiratory  variability, suggesting right atrial pressure of 15 mmHg.   7. Aortic dilatation noted. There is mild dilatation of the aortic root,  measuring 40 mm, but   8. Within normal limits for age when indexed to body surface area.   Patient Profile     89 y.o. male with a hx of HFrEF, NICM, HNT, DM2, BPH, and anxiety who presented with concerns for CHF exacerbation in the setting of influenza.  Assessment & Plan    Acute on chronic heart failure NICM AKI - echo this admission with LVEF 35-40%, presented with concern for hypervolemia - he was initially diuresed, complicated by rising creatinine, which has now reach plateau - sCr 1.70 (1.71), BUN 54 - holding diuresis, jardiance, and losartan for possibly hypovolemia and AKI - he remains on 12.5 mg coreg BID and 50 mg hydralazine TID - dry weigh felt 220-225 lbs - weight is 220 lbs today - he had emesis overnight and diarrhea x 4 - may consider C diff testing     For questions or updates, please contact [facility] Please consult [email or web address] for contact info under        Signed, Marcelino Duster, PA  04/14/2023, 10:38 AM     I have seen and examined the patient along with  Marcelino Duster, PA .  I have reviewed the chart, notes and new data.  I agree with PA/NP's note.  Key new complaints: No longer having nausea or vomiting, but has had new episodes  of diarrhea.  Denies dyspnea or chest pain. Key examination changes: No signs of hypervolemia.  Continues to have regular baseline sinus rhythm, but with extremely frequent ectopy (PACs on monitor).  No convincing evidence of true atrial fibrillation. Key new findings / data: Renal function parameters are essentially unchanged since yesterday.  Normal electrolytes.  PLAN: No evidence for ongoing heart failure exacerbation.  Continue to hold RAAS inhibitors/diuretics/SGLT2 inhibitors medications that could worsen renal dysfunction until his diarrhea resolves.  Continue carvedilol.  Thurmon Fair, MD, [facility] [phone] 04/14/2023, 12:33 PM

## 2023-04-14 NOTE — Plan of Care (Signed)

## 2023-04-14 NOTE — Progress Notes (Signed)
 PROGRESS NOTE    NATE COMMON  WUJ:[id number] DOB: 1934/10/23 DOA: 04/08/2023 PCP: Joaquim Nam, MD   Brief Narrative:  Mr. Fetterman was admitted to the hospital with the working diagnosis of acute on chronic systolic heart failure exacerbation in the setting of acute influenza infection.    89 year old male with history of heart failure, insulin-dependent diabetes type 2, hypertension, hyperlipidemia, BPH, anxiety who presented from home with complaint of shortness of breath, cough.  Reported dyspnea on minimal exertion, weakness, and subjective fever at home.  EMS found him hypoxic in the range of 88% on room air.  On presentation, he had mild grade fever, required 4 L of oxygen per minute via nasal cannula.   Lab work showed potassium 3.1, creatinine 1.2, elevated BNP of 618.  Influenza A found to be positive.   Chest x-ray showed a large cardiac silhouette, increased pulmonary vasculature with patchy bilateral airspace disease.   Patient was placed on furosemide for diuresis and Oseltamivir for influenza.    Echocardiogram with reduced LV systolic function.   Assessment & Plan:   Principal Problem:   Acute on chronic systolic CHF (congestive heart failure) (HCC) Active Problems:   Influenza A   Essential hypertension   Hypokalemia   CAD (coronary artery disease)   Type 2 diabetes mellitus with vascular disease (HCC)   BPH with obstruction/lower urinary tract symptoms   Anxiety   Obesity, class 1  Acute hypoxic respiratory failure secondary to acute on chronic combined systolic and CHF (congestive heart failure) (HCC), POA: Echocardiogram with reduced LV systolic function with EF 35 to 40%, global hypokinesis, grade 1 diastolic dysfunction.  Chest x-ray also with pulmonary edema.  Cardiology following.  Patient started on Lasix IV 40 mg twice daily.  Slight bump in creatinine, Lasix, Jardiance, Aldactone and losartan are on hold.  Still appears dry with elevated creatinine.   Continue Coreg but hold these medications.   Influenza A Symptoms improving, continue supportive medical therapy with Oseltamivir.    Essential hypertension Blood pressure very well-controlled, I will continue Coreg and hydralazine but will hold losartan to prevent further renal insult.   Nausea vomiting/diarrhea: He did not have any of those complaints today however he mentioned that he has not tries to eat anything either.  I encouraged him to try to eat today.  AKI: Upon extensive chart review, it appears that patient's baseline creatinine fluctuates but mostly ranges between 1-1.3.  Currently elevated 1.71 today but stable compared to yesterday.  Still clinically appears dry.  Will hydrate him gently again with normal saline at 50 cc/h for 10 hours.  Continue to hold nephrotoxic agents.     Hypokalemia Resolved.   CAD (coronary artery disease) No chest pain, no acute coronary syndrome.    Type 2 diabetes mellitus with vascular disease (HCC) Uncontrolled hyperglycemia.  Steroids discontinued.  Blood sugar slightly improved but still elevated.  Patient on 16 units of Semglee at night and SSI.  Will increase Semglee to 20 units.   BPH with obstruction/lower urinary tract symptoms Continue with tamsulosin.    Anxiety Continue with escitalopram.    Acute metabolic encephalopathy has resolved.  EEG negative for seizures.  Continue supportive medical therapy, PT and OT.    Obesity, class 1 Calculated BMI is 31.0, weight loss and diet modification counseled.  DVT prophylaxis: enoxaparin (LOVENOX) injection 40 mg Start: 04/09/23 1000   Code Status: Full Code  Family Communication: None present at bedside.  Plan of care discussed with  patient in length and he/she verbalized understanding and agreed with it.  Status is: Inpatient Remains inpatient appropriate because: Rising creatinine   Estimated body mass index is 29.87 kg/m as calculated from the following:   Height as of this  encounter: 6' (1.829 m).   Weight as of this encounter: 99.9 kg.    Nutritional Assessment: Body mass index is 29.87 kg/m.Marland Kitchen Seen by dietician.  I agree with the assessment and plan as outlined below: Nutrition Status:        . Skin Assessment: I have examined the patient's skin and I agree with the wound assessment as performed by the wound care RN as outlined below:    Consultants:  Cardiology  Procedures:  None  Antimicrobials:  Anti-infectives (From admission, onward)    Start     Dose/Rate Route Frequency Ordered Stop   04/09/23 1000  oseltamivir (TAMIFLU) capsule 75 mg  Status:  Discontinued        75 mg Oral 2 times daily 04/09/23 0107 04/09/23 0111   04/09/23 1000  oseltamivir (TAMIFLU) capsule 30 mg        30 mg Oral 2 times daily 04/09/23 0111 04/13/23 0858   04/08/23 2345  oseltamivir (TAMIFLU) capsule 75 mg        75 mg Oral  Once 04/08/23 2338 04/09/23 0010         Subjective: Patient seen and examined.  Sleepy but arousable but still slightly weak.  Denies any nausea, vomiting diarrhea or any other complaint.  Objective: Vitals:   04/13/23 0630 04/13/23 1351 04/13/23 1949 04/14/23 0501  BP: (!) 148/83 (!) 153/69 (!) 159/80 (!) 137/57  Pulse: (!) 48 64 (!) 59 72  Resp: 16 18 20 15   Temp: 97.9 F (36.6 C) 98.4 F (36.9 C) 97.9 F (36.6 C) 98.4 F (36.9 C)  TempSrc:      SpO2: 92% (!) 89% 92% 90%  Weight: 99.7 kg   99.9 kg  Height:        Intake/Output Summary (Last 24 hours) at 04/14/2023 0820 Last data filed at 04/14/2023 0533 Gross per 24 hour  Intake --  Output 150 ml  Net -150 ml   Filed Weights   04/12/23 0125 04/13/23 0630 04/14/23 0501  Weight: 102.7 kg 99.7 kg 99.9 kg    Examination:  General exam: Appears lethargic and dehydrated but comfortable. Respiratory system: Clear to auscultation. Respiratory effort normal. Cardiovascular system: S1 & S2 heard, RRR. No JVD, murmurs, rubs, gallops or clicks. No pedal  edema. Gastrointestinal system: Abdomen is nondistended, soft and nontender. No organomegaly or masses felt. Normal bowel sounds heard. Central nervous system: Alert and oriented. No focal neurological deficits. Extremities: Symmetric 5 x 5 power. Skin: No rashes, lesions or ulcers.  Psychiatry: Judgement and insight appear normal. Mood & affect appropriate.   Data Reviewed: I have personally reviewed following labs and imaging studies  CBC: Recent Labs  Lab 04/08/23 2225 04/09/23 0532 04/10/23 0526 04/11/23 0710 04/13/23 0953  WBC 9.8 8.6 7.2 13.6* 12.2*  NEUTROABS 7.5  --   --   --  10.2*  HGB 12.2* 13.2 13.6 13.0 16.0  HCT 36.7* 40.2 43.3 40.5 50.2  MCV 90.6 91.4 93.5 91.4 93.7  PLT 236 263 266 275 302   Basic Metabolic Panel: Recent Labs  Lab 04/09/23 0532 04/10/23 0526 04/11/23 0710 04/12/23 0628 04/13/23 0953 04/14/23 0603  NA 132* 135 135 140 136 134*  K 3.5 3.6 3.0* 3.5 3.3* 3.5  CL 97*  96* 95* 99 93* 95*  CO2 25 27 28 30 31 27   GLUCOSE 213* 272* 228* 199* 229* 191*  BUN 19 28* 37* 42* 44* 54*  CREATININE 1.18 1.23 1.16 1.36* 1.71* 1.70*  CALCIUM 9.3 9.6 9.4 9.8 9.5 8.6*  MG 1.8  --   --  2.1  --   --    GFR: Estimated Creatinine Clearance: 38.2 mL/min (A) (by C-G formula based on SCr of 1.7 mg/dL (H)). Liver Function Tests: Recent Labs  Lab 04/08/23 2225  AST 14*  ALT 15  ALKPHOS 50  BILITOT 1.0  PROT 6.4*  ALBUMIN 3.6   No results for input(s): "LIPASE", "AMYLASE" in the last 168 hours. No results for input(s): "AMMONIA" in the last 168 hours. Coagulation Profile: Recent Labs  Lab 04/08/23 2225  INR 1.1   Cardiac Enzymes: No results for input(s): "CKTOTAL", "CKMB", "CKMBINDEX", "TROPONINI" in the last 168 hours. BNP (last 3 results) No results for input(s): "PROBNP" in the last 8760 hours. HbA1C: No results for input(s): "HGBA1C" in the last 72 hours. CBG: Recent Labs  Lab 04/13/23 0801 04/13/23 1220 04/13/23 1722 04/13/23 2115  04/14/23 0733  GLUCAP 226* 207* 209* 229* 176*   Lipid Profile: No results for input(s): "CHOL", "HDL", "LDLCALC", "TRIG", "CHOLHDL", "LDLDIRECT" in the last 72 hours. Thyroid Function Tests: No results for input(s): "TSH", "T4TOTAL", "FREET4", "T3FREE", "THYROIDAB" in the last 72 hours. Anemia Panel: No results for input(s): "VITAMINB12", "FOLATE", "FERRITIN", "TIBC", "IRON", "RETICCTPCT" in the last 72 hours. Sepsis Labs: Recent Labs  Lab 04/08/23 2234  LATICACIDVEN 0.7    Recent Results (from the past 240 hours)  Blood Culture (routine x 2)     Status: None (Preliminary result)   Collection Time: 04/08/23 10:27 PM   Specimen: BLOOD RIGHT ARM  Result Value Ref Range Status   Specimen Description   Final    BLOOD RIGHT ARM Performed at [facility], [address], [place], [place] [address]    Special Requests   Final    BOTTLES DRAWN AEROBIC AND ANAEROBIC Blood Culture adequate volume Performed at [facility], [address] [place], [place] [address]    Culture   Final    NO GROWTH 4 DAYS Performed at [facility], [address] [place], [place] [address]    Report Status PENDING  Incomplete  Resp panel by RT-PCR (RSV, Flu A&B, Covid) Anterior Nasal Swab     Status: Abnormal   Collection Time: 04/08/23 10:50 PM   Specimen: Anterior Nasal Swab  Result Value Ref Range Status   SARS Coronavirus 2 by RT PCR NEGATIVE NEGATIVE Final    Comment: (NOTE) SARS-CoV-2 target nucleic acids are NOT DETECTED.  The SARS-CoV-2 RNA is generally detectable in upper respiratory specimens during the acute phase of infection. The lowest concentration of SARS-CoV-2 viral copies this assay can detect is 138 copies/mL. A negative result does not preclude SARS-Cov-2 infection and should not be used as the sole basis for treatment or other patient management decisions. A negative result may occur with  improper specimen collection/handling,  submission of specimen other than nasopharyngeal swab, presence of viral mutation(s) within the areas targeted by this assay, and inadequate number of viral copies(<138 copies/mL). A negative result must be combined with clinical observations, patient history, and epidemiological information. The expected result is Negative.  Fact Sheet for Patients:  BloggerCourse.com  Fact Sheet for Healthcare Providers:  SeriousBroker.it  This test is no t yet approved  or cleared by the [place] and  has been authorized for detection and/or diagnosis of SARS-CoV-2 by FDA under an Emergency Use Authorization (EUA). This EUA will remain  in effect (meaning this test can be used) for the duration of the COVID-19 declaration under Section 564(b)(1) of the Act, 21 U.S.C.section 360bbb-3(b)(1), unless the authorization is terminated  or revoked sooner.       Influenza A by PCR POSITIVE (A) NEGATIVE Final   Influenza B by PCR NEGATIVE NEGATIVE Final    Comment: (NOTE) The Xpert Xpress SARS-CoV-2/FLU/RSV plus assay is intended as an aid in the diagnosis of influenza from Nasopharyngeal swab specimens and should not be used as a sole basis for treatment. Nasal washings and aspirates are unacceptable for Xpert Xpress SARS-CoV-2/FLU/RSV testing.  Fact Sheet for Patients: BloggerCourse.com  Fact Sheet for Healthcare Providers: SeriousBroker.it  This test is not yet approved or cleared by the [place] FDA and has been authorized for detection and/or diagnosis of SARS-CoV-2 by FDA under an Emergency Use Authorization (EUA). This EUA will remain in effect (meaning this test can be used) for the duration of the COVID-19 declaration under Section 564(b)(1) of the Act, 21 U.S.C. section 360bbb-3(b)(1), unless the authorization is terminated or revoked.     Resp Syncytial Virus by PCR  NEGATIVE NEGATIVE Final    Comment: (NOTE) Fact Sheet for Patients: BloggerCourse.com  Fact Sheet for Healthcare Providers: SeriousBroker.it  This test is not yet approved or cleared by the [place] FDA and has been authorized for detection and/or diagnosis of SARS-CoV-2 by FDA under an Emergency Use Authorization (EUA). This EUA will remain in effect (meaning this test can be used) for the duration of the COVID-19 declaration under Section 564(b)(1) of the Act, 21 U.S.C. section 360bbb-3(b)(1), unless the authorization is terminated or revoked.  Performed at [facility], [address], [place], [place] [address]      Radiology Studies: No results found.   Scheduled Meds:  aspirin EC  81 mg Oral QPM   carvedilol  12.5 mg Oral BID WC   enoxaparin (LOVENOX) injection  40 mg Subcutaneous Q24H   escitalopram  20 mg Oral Daily   hydrALAZINE  50 mg Oral TID   insulin aspart  0-20 Units Subcutaneous TID WC   insulin aspart  0-5 Units Subcutaneous QHS   insulin glargine  20 Units Subcutaneous QHS   sodium chloride flush  3 mL Intravenous Q12H   tamsulosin  0.4 mg Oral QPC supper   Continuous Infusions:   LOS: 5 days   Hughie Closs, MD [facility]  04/14/2023, 8:20 AM   *Please note that this is a verbal dictation therefore any spelling or grammatical errors are due to the "Dragon Medical One" system interpretation.  Please page via Amion and do not message via secure chat for urgent patient care matters. Secure chat can be used for non urgent patient care matters.  How to contact the [facility] Attending or Consulting provider 7A - 7P or covering provider during after hours 7P -7A, for this patient?  Check the care team in [facility] and look for a) attending/consulting TRH provider listed and b) the [facility] team listed. Page or secure chat 7A-7P. Log into [email or web address] and use [place]'s universal password  to access. If you do not have the password, please contact the hospital operator. Locate the [facility] provider you are looking for under [facility] and page to a number that you can be directly reached.  If you still have difficulty reaching the provider, please page the [facility] (Director on Call) for the Hospitalists listed on amion for assistance.

## 2023-04-14 NOTE — Progress Notes (Signed)
 Physical Therapy Treatment Patient Details Name: [name] MRN: [id number] DOB: 1934/09/14 Today's Date: 04/14/2023   History of Present Illness Pt is an 89 y.o. male with dx of Acute respiratory failure with hypoxia, Acute on chronic HFmrEF, and Influenza A. PMH: HFmrEF, insulin-dependent DM2, HTN, hyperlipidemia, BPH, anxiety, CAD    PT Comments  Pt agreeable to working with therapy. He tolerated activity well. Will continue to follow. Recommend HHPT f/u.     If plan is discharge home, recommend the following: A little help with walking and/or transfers;A little help with bathing/dressing/bathroom;Assistance with cooking/housework;Assist for transportation;Help with stairs or ramp for entrance   Can travel by private vehicle        Equipment Recommendations  None recommended by PT    Recommendations for Other Services       Precautions / Restrictions Precautions Precautions: Fall Restrictions Weight Bearing Restrictions Per Provider Order: No     Mobility  Bed Mobility Overal bed mobility: Needs Assistance Bed Mobility: Supine to Sit     Supine to sit: Supervision, HOB elevated, Used rails     General bed mobility comments: Supv for lines. Increased time.    Transfers Overall transfer level: Needs assistance Equipment used: None Transfers: Sit to/from Stand, Bed to chair/wheelchair/BSC Sit to Stand: Contact guard assist Stand pivot transfers: Contact guard assist         General transfer comment: Pt used BSC armrests for support. Increased time.    Ambulation/Gait Ambulation/Gait assistance: Contact guard assist Gait Distance (Feet): 120 Feet Assistive device: Rolling walker (2 wheels) Gait Pattern/deviations: Step-through pattern, Decreased stride length       General Gait Details: Tolerated distance well. No LOB with RW use.   Stairs             Wheelchair Mobility     Tilt Bed    Modified Rankin (Stroke Patients Only)        Balance Overall balance assessment: Needs assistance         Standing balance support: Bilateral upper extremity supported, Reliant on assistive device for balance, During functional activity Standing balance-Leahy Scale: Fair                              Hotel manager: No apparent difficulties Factors Affecting Communication: Hearing impaired  Cognition Arousal: Alert Behavior During Therapy: WFL for tasks assessed/performed   PT - Cognitive impairments: No apparent impairments                         Following commands: Intact      Cueing    Exercises      General Comments        Pertinent Vitals/Pain Pain Assessment Pain Assessment: No/denies pain    Home Living                          Prior Function            PT Goals (current goals can now be found in the care plan section) Progress towards PT goals: Progressing toward goals    Frequency    Min 1X/week      PT Plan      Co-evaluation              AM-PAC PT "6 Clicks" Mobility   Outcome Measure  Help needed turning from your back to your  side while in a flat bed without using bedrails?: A Little Help needed moving from lying on your back to sitting on the side of a flat bed without using bedrails?: A Little Help needed moving to and from a bed to a chair (including a wheelchair)?: A Little Help needed standing up from a chair using your arms (e.g., wheelchair or bedside chair)?: A Little Help needed to walk in hospital room?: A Little Help needed climbing 3-5 steps with a railing? : A Lot 6 Click Score: 17    End of Session Equipment Utilized During Treatment: Gait belt Activity Tolerance: Patient tolerated treatment well Patient left: in chair;with call bell/phone within reach;with chair alarm set   PT Visit Diagnosis: Difficulty in walking, not elsewhere classified (R26.2);Muscle weakness (generalized) (M62.81)      Time: 3086-5784 PT Time Calculation (min) (ACUTE ONLY): 23 min  Charges:    $Gait Training: 8-22 mins $Therapeutic Activity: 8-22 mins PT General Charges $$ ACUTE PT VISIT: 1 Visit                         [name], PT Acute Rehabilitation  Office: [phone]

## 2023-04-15 DIAGNOSIS — I5023 Acute on chronic systolic (congestive) heart failure: Secondary | ICD-10-CM | POA: Diagnosis not present

## 2023-04-15 LAB — GLUCOSE, CAPILLARY
Glucose-Capillary: 127 mg/dL — ABNORMAL HIGH (ref 70–99)
Glucose-Capillary: 138 mg/dL — ABNORMAL HIGH (ref 70–99)
Glucose-Capillary: 79 mg/dL (ref 70–99)
Glucose-Capillary: 134 mg/dL — ABNORMAL HIGH (ref 70–99)

## 2023-04-15 LAB — BASIC METABOLIC PANEL
Anion gap: 10 (ref 5–15)
BUN: 52 mg/dL — ABNORMAL HIGH (ref 8–23)
CO2: 25 mmol/L (ref 22–32)
Calcium: 8.4 mg/dL — ABNORMAL LOW (ref 8.9–10.3)
Chloride: 95 mmol/L — ABNORMAL LOW (ref 98–111)
Creatinine, Ser: 1.53 mg/dL — ABNORMAL HIGH (ref 0.61–1.24)
GFR, Estimated: 44 mL/min — ABNORMAL LOW (ref >60)
Glucose, Bld: 132 mg/dL — ABNORMAL HIGH (ref 70–99)
Potassium: 3 mmol/L — ABNORMAL LOW (ref 3.5–5.1)
Sodium: 130 mmol/L — ABNORMAL LOW (ref 135–145)

## 2023-04-15 MED ORDER — SODIUM CHLORIDE 0.9 % IV SOLN: INTRAVENOUS | Status: AC

## 2023-04-15 MED ORDER — POTASSIUM CHLORIDE CRYS ER 20 MEQ PO TBCR
40.0000 meq | EXTENDED_RELEASE_TABLET | ORAL | Status: AC
  Administered 2023-04-15 (×2): 40 meq via ORAL
  Filled 2023-04-15 (×2): qty 2

## 2023-04-15 NOTE — Progress Notes (Signed)
   Patient Name: JEROMEY KRUER Date of Encounter: 04/15/2023 [facility] Cardiologist: Donato Schultz, MD   Interval Summary  .    Unfortunately he continues to have diarrhea.  Required IV fluids.  Doing better now.  Slight improvement in renal parameters.  Vital Signs .    Vitals:   04/15/23 0500 04/15/23 0515 04/15/23 1003 04/15/23 1254  BP:  (!) 132/58  (!) 131/51  Pulse:  (!) 49 70 (!) 45  Resp:  18  14  Temp:  98.6 F (37 C)  98.1 F (36.7 C)  TempSrc:  Oral    SpO2:  92%  93%  Weight: 102 kg     Height:        Intake/Output Summary (Last 24 hours) at 04/15/2023 1336 Last data filed at 04/15/2023 1005 Gross per 24 hour  Intake 980 ml  Output 575 ml  Net 405 ml      04/15/2023    5:00 AM 04/14/2023    5:01 AM 04/13/2023    6:30 AM  Last 3 Weights  Weight (lbs) 224 lb 13.9 oz 220 lb 3.8 oz 219 lb 12.8 oz  Weight (kg) 102 kg 99.9 kg 99.7 kg      Telemetry/ECG    SR with PACs - Personally Reviewed  Physical Exam .   GEN: No acute distress.   Neck: No JVD Cardiac: Frequent ectopy on a pattern of background RRR, no murmurs, rubs, or gallops.  Respiratory: Clear to auscultation bilaterally. GI: Soft, nontender, non-distended  MS: No edema  Assessment & Plan .     Significant increase in weight since yesterday.  Appears grossly euvolemic, although renal parameters not back to baseline.  Diarrhea seems to have abated.  He is not having true bradycardia on telemetry.  Note that he can have falsely low peripheral pulse (as measured by palpating the radial pulse, automatic blood pressure cuff or pulse oximeter) since he has extremely frequent premature atrial complexes.  Premature beats (associated with a lower blood pressure) are often not detected by peripheral mechanical means.  Any report of severe bradycardia obtained with these methods should be confirmed with an electrical tracing.  Continue carvedilol.  It is probably still premature to restart his Jardiance  or his diuretics, but suspect that tomorrow we will see improvement.   For questions or updates, please contact [facility] Please consult [email or web address] for contact info under        Signed, Thurmon Fair, MD

## 2023-04-15 NOTE — Plan of Care (Signed)
  Problem: Fluid Volume: Goal: Ability to maintain a balanced intake and output will improve Outcome: Progressing   Problem: Metabolic: Goal: Ability to maintain appropriate glucose levels will improve Outcome: Progressing   Problem: Education: Goal: Knowledge of General Education information will improve Description: Including pain rating scale, medication(s)/side effects and non-pharmacologic comfort measures Outcome: Progressing   Problem: Activity: Goal: Risk for activity intolerance will decrease Outcome: Progressing   Problem: Elimination: Goal: Will not experience complications related to bowel motility Outcome: Progressing Goal: Will not experience complications related to urinary retention Outcome: Progressing   Problem: Pain Managment: Goal: General experience of comfort will improve and/or be controlled Outcome: Progressing   Problem: Safety: Goal: Ability to remain free from injury will improve Outcome: Progressing

## 2023-04-15 NOTE — Plan of Care (Signed)

## 2023-04-15 NOTE — Progress Notes (Signed)
 Occupational Therapy Treatment Patient Details Name: [name] MRN: [id number] DOB: 12-10-1934 Today's Date: 04/15/2023   History of present illness Pt is an 89 y.o. male with dx of Acute respiratory failure with hypoxia, Acute on chronic HFmrEF, and Influenza A. PMH: HFmrEF, insulin-dependent DM2, HTN, hyperlipidemia, BPH, anxiety, CAD   OT comments  Patient seen for skilled OT session this am. Reports having a "bad night" with more nausea and diarrhea. Open to all presented activity and tolerated session without issues. OT progressed activity and standing tolerance with shaving activity as well as grooming with ECT education and balance retraining. OT also issued and trained in B UE foam resistance ball and light yellow tband for in room and HEP with + teach back of pacing and breathing integration. OT will continue to follow in Acute setting with no changes to rec for home with family assist/S and HHOT services.       If plan is discharge home, recommend the following:  A little help with walking and/or transfers;A little help with bathing/dressing/bathroom;Assistance with cooking/housework;Direct supervision/assist for medications management;Direct supervision/assist for financial management;Assist for transportation;Help with stairs or ramp for entrance   Equipment Recommendations  None recommended by OT       Precautions / Restrictions Precautions Precautions: Fall Recall of Precautions/Restrictions: Intact Restrictions Weight Bearing Restrictions Per Provider Order: No       Mobility Bed Mobility Overal bed mobility: Modified Independent Bed Mobility: Supine to Sit     Supine to sit: Supervision Sit to supine: Contact guard assist        Transfers Overall transfer level: Needs assistance Equipment used: Rolling walker (2 wheels) Transfers: Sit to/from Stand, Bed to chair/wheelchair/BSC Sit to Stand: Contact guard assist                 Balance Overall  balance assessment: Needs assistance         Standing balance support: Single extremity supported Standing balance-Leahy Scale: Fair                             ADL either performed or assessed with clinical judgement   ADL Overall ADL's : Needs assistance/impaired     Grooming: Sitting;Standing;Contact guard assist;Wash/dry hands;Wash/dry face;Supervision/safety (shaving seated and standing)                                 General ADL Comments: Progressed overall activity tolerance this session with some grooming tasks standing level with seated rests required    Extremity/Trunk Assessment Upper Extremity Assessment Upper Extremity Assessment: Overall WFL for tasks assessed   Lower Extremity Assessment Lower Extremity Assessment: Generalized weakness                 Communication Communication Communication: No apparent difficulties Factors Affecting Communication: Hearing impaired   Cognition Arousal: Alert Behavior During Therapy: WFL for tasks assessed/performed Cognition: No apparent impairments                               Following commands: Intact           Exercises Exercises: Other exercises (pt completed B hand ex with foam resistance ball 10 reps x 2 sets gross grasp and pinch, B UE yellow (light) tband for horizontal sh abd/add and triceps press 10 reps x 2 sets wit rec for  10 reps of each 3x per day. Issued both for in room/home c/o)            Pertinent Vitals/ Pain       Pain Assessment Pain Assessment: No/denies pain   Frequency  Min 2X/week        Progress Toward Goals  OT Goals(current goals can now be found in the care plan section)  Progress towards OT goals: Progressing toward goals  Acute Rehab OT Goals Patient Stated Goal: to get better OT Goal Formulation: With patient Time For Goal Achievement: 04/24/23 Potential to Achieve Goals: Good ADL Goals Pt Will Perform Grooming: with  modified independence;standing Pt Will Perform Lower Body Bathing: with supervision;sitting/lateral leans;sit to/from stand Pt Will Perform Lower Body Dressing: with supervision;sitting/lateral leans;sit to/from stand Pt Will Transfer to Toilet: with supervision;grab bars;regular height toilet;ambulating  Plan         AM-PAC OT "6 Clicks" Daily Activity     Outcome Measure   Help from another person eating meals?: None Help from another person taking care of personal grooming?: A Little Help from another person toileting, which includes using toliet, bedpan, or urinal?: A Little Help from another person bathing (including washing, rinsing, drying)?: A Little Help from another person to put on and taking off regular upper body clothing?: A Little Help from another person to put on and taking off regular lower body clothing?: A Little 6 Click Score: 19    End of Session Equipment Utilized During Treatment: Gait belt;Rolling walker (2 wheels)  OT Visit Diagnosis: Unsteadiness on feet (R26.81);Muscle weakness (generalized) (M62.81)   Activity Tolerance Patient limited by fatigue   Patient Left in bed;with call bell/phone within reach;with bed alarm set   Nurse Communication Mobility status        Time: 0981-1914 OT Time Calculation (min): 30 min  Charges: OT General Charges $OT Visit: 1 Visit OT Treatments $Self Care/Home Management : 23-37 mins  Whitten Andreoni OT/L Acute Rehabilitation Department  [phone]    04/15/2023, 10:01 AM

## 2023-04-15 NOTE — Progress Notes (Signed)
 PROGRESS NOTE    [name]  NFA:[id number] DOB: 1934/11/24 DOA: 04/08/2023 PCP: [name] Nam, MD   Brief Narrative:  [name] was admitted to the hospital with the working diagnosis of acute on chronic systolic heart failure exacerbation in the setting of acute influenza infection.    89 year old male with history of heart failure, insulin-dependent diabetes type 2, hypertension, hyperlipidemia, BPH, anxiety who presented from home with complaint of shortness of breath, cough.  Reported dyspnea on minimal exertion, weakness, and subjective fever at home.  EMS found him hypoxic in the range of 88% on room air.  On presentation, he had mild grade fever, required 4 L of oxygen per minute via nasal cannula.   Lab work showed potassium 3.1, creatinine 1.2, elevated BNP of 618.  Influenza A found to be positive.   Chest x-ray showed a large cardiac silhouette, increased pulmonary vasculature with patchy bilateral airspace disease.   Patient was placed on furosemide for diuresis and Oseltamivir for influenza.    Echocardiogram with reduced LV systolic function.   Assessment & Plan:   Principal Problem:   Acute on chronic systolic CHF (congestive heart failure) (HCC) Active Problems:   Influenza A   Essential hypertension   Hypokalemia   CAD (coronary artery disease)   Type 2 diabetes mellitus with vascular disease (HCC)   BPH with obstruction/lower urinary tract symptoms   Anxiety   Obesity, class 1  Acute hypoxic respiratory failure secondary to acute on chronic combined systolic and CHF (congestive heart failure) (HCC), POA: Echocardiogram with reduced LV systolic function with EF 35 to 40%, global hypokinesis, grade 1 diastolic dysfunction.  Chest x-ray also with pulmonary edema.  Cardiology following.  Patient started on Lasix IV 40 mg twice daily.  Slight bump in creatinine which is only slightly improved but not back to normal yet, Lasix, Jardiance, Aldactone and losartan  are on hold.  Still appears dry with elevated creatinine.  Patient appears to have bradycardia, on Coreg 12.5 p.o. twice daily, wonder if we should reduce that, will defer to cardiology.  Appreciate cardiology help.   Influenza A Symptoms improving, continue supportive medical therapy with Oseltamivir.    Essential hypertension Blood pressure very well-controlled, I will continue Coreg and hydralazine but will hold losartan to prevent further renal insult.   Nausea vomiting/diarrhea: Resolved.  Feels better.  Tolerating diet.  AKI: Upon extensive chart review, it appears that patient's baseline creatinine fluctuates but mostly ranges between 1-1.3.  Peaked to 1.71 04/14/2023, given 500 cc fluid, improved to only 1.53.  Much still appears dry, will give another 500 cc today.  Continue to hold nephrotoxic agents.     Hypokalemia Resolved.   CAD (coronary artery disease) No chest pain, no acute coronary syndrome.    Type 2 diabetes mellitus with vascular disease (HCC) Now blood sugar better controlled.  Patient on 20 units of Semglee at night and SSI.    BPH with obstruction/lower urinary tract symptoms Continue with tamsulosin.    Anxiety Continue with escitalopram.    Acute metabolic encephalopathy has resolved.  EEG negative for seizures.  Continue supportive medical therapy, PT and OT.    Obesity, class 1 Calculated BMI is 31.0, weight loss and diet modification counseled.  DVT prophylaxis: enoxaparin (LOVENOX) injection 40 mg Start: 04/09/23 1000   Code Status: Full Code  Family Communication: None present at bedside.  Plan of care discussed with patient in length and he/she verbalized understanding and agreed with it.  Status  is: Inpatient Remains inpatient appropriate because: Needs further IV fluids, creatinine is still high.  Will likely need rechallenging of GDMT medications once creatinine normal.   Estimated body mass index is 30.5 kg/m as calculated from the  following:   Height as of this encounter: 6' (1.829 m).   Weight as of this encounter: 102 kg.    Nutritional Assessment: Body mass index is 30.5 kg/m.Marland Kitchen Seen by dietician.  I agree with the assessment and plan as outlined below: Nutrition Status:        . Skin Assessment: I have examined the patient's skin and I agree with the wound assessment as performed by the wound care RN as outlined below:    Consultants:  Cardiology  Procedures:  None  Antimicrobials:  Anti-infectives (From admission, onward)    Start     Dose/Rate Route Frequency Ordered Stop   04/09/23 1000  oseltamivir (TAMIFLU) capsule 75 mg  Status:  Discontinued        75 mg Oral 2 times daily 04/09/23 0107 04/09/23 0111   04/09/23 1000  oseltamivir (TAMIFLU) capsule 30 mg        30 mg Oral 2 times daily 04/09/23 0111 04/13/23 0858   04/08/23 2345  oseltamivir (TAMIFLU) capsule 75 mg        75 mg Oral  Once 04/08/23 2338 04/09/23 0010         Subjective: Patient seen and examined.  He is fully alert and feels much better today.  No complaints. Objective: Vitals:   04/14/23 1153 04/14/23 2239 04/15/23 0500 04/15/23 0515  BP: 139/72 132/75  (!) 132/58  Pulse: (!) 52 (!) 49  (!) 49  Resp: 17 18  18   Temp: 97.6 F (36.4 C) 97.9 F (36.6 C)  98.6 F (37 C)  TempSrc:    Oral  SpO2: 96% 92%  92%  Weight:   102 kg   Height:        Intake/Output Summary (Last 24 hours) at 04/15/2023 0848 Last data filed at 04/15/2023 0500 Gross per 24 hour  Intake 740 ml  Output 575 ml  Net 165 ml   Filed Weights   04/13/23 0630 04/14/23 0501 04/15/23 0500  Weight: 99.7 kg 99.9 kg 102 kg    Examination:  General exam: Appears calm and comfortable  Respiratory system: Clear to auscultation. Respiratory effort normal. Cardiovascular system: S1 & S2 heard, RRR. No JVD, murmurs, rubs, gallops or clicks. No pedal edema. Gastrointestinal system: Abdomen is nondistended, soft and nontender. No organomegaly or masses  felt. Normal bowel sounds heard. Central nervous system: Alert and oriented. No focal neurological deficits. Extremities: Symmetric 5 x 5 power. Skin: No rashes, lesions or ulcers.  Psychiatry: Judgement and insight appear normal. Mood & affect appropriate.   Data Reviewed: I have personally reviewed following labs and imaging studies  CBC: Recent Labs  Lab 04/08/23 2225 04/09/23 0532 04/10/23 0526 04/11/23 0710 04/13/23 0953  WBC 9.8 8.6 7.2 13.6* 12.2*  NEUTROABS 7.5  --   --   --  10.2*  HGB 12.2* 13.2 13.6 13.0 16.0  HCT 36.7* 40.2 43.3 40.5 50.2  MCV 90.6 91.4 93.5 91.4 93.7  PLT 236 263 266 275 302   Basic Metabolic Panel: Recent Labs  Lab 04/09/23 0532 04/10/23 0526 04/11/23 0710 04/12/23 0628 04/13/23 0953 04/14/23 0603 04/15/23 0547  NA 132*   < > 135 140 136 134* 130*  K 3.5   < > 3.0* 3.5 3.3* 3.5 3.0*  CL  97*   < > 95* 99 93* 95* 95*  CO2 25   < > 28 30 31 27 25   GLUCOSE 213*   < > 228* 199* 229* 191* 132*  BUN 19   < > 37* 42* 44* 54* 52*  CREATININE 1.18   < > 1.16 1.36* 1.71* 1.70* 1.53*  CALCIUM 9.3   < > 9.4 9.8 9.5 8.6* 8.4*  MG 1.8  --   --  2.1  --   --   --    < > = values in this interval not displayed.   GFR: Estimated Creatinine Clearance: 42.8 mL/min (A) (by C-G formula based on SCr of 1.53 mg/dL (H)). Liver Function Tests: Recent Labs  Lab 04/08/23 2225  AST 14*  ALT 15  ALKPHOS 50  BILITOT 1.0  PROT 6.4*  ALBUMIN 3.6   No results for input(s): "LIPASE", "AMYLASE" in the last 168 hours. No results for input(s): "AMMONIA" in the last 168 hours. Coagulation Profile: Recent Labs  Lab 04/08/23 2225  INR 1.1   Cardiac Enzymes: No results for input(s): "CKTOTAL", "CKMB", "CKMBINDEX", "TROPONINI" in the last 168 hours. BNP (last 3 results) No results for input(s): "PROBNP" in the last 8760 hours. HbA1C: No results for input(s): "HGBA1C" in the last 72 hours. CBG: Recent Labs  Lab 04/14/23 0733 04/14/23 1154 04/14/23 1730  04/14/23 2149 04/15/23 0739  GLUCAP 176* 163* 129* 117* 127*   Lipid Profile: No results for input(s): "CHOL", "HDL", "LDLCALC", "TRIG", "CHOLHDL", "LDLDIRECT" in the last 72 hours. Thyroid Function Tests: No results for input(s): "TSH", "T4TOTAL", "FREET4", "T3FREE", "THYROIDAB" in the last 72 hours. Anemia Panel: No results for input(s): "VITAMINB12", "FOLATE", "FERRITIN", "TIBC", "IRON", "RETICCTPCT" in the last 72 hours. Sepsis Labs: Recent Labs  Lab 04/08/23 2234  LATICACIDVEN 0.7    Recent Results (from the past 240 hours)  Blood Culture (routine x 2)     Status: None   Collection Time: 04/08/23 10:27 PM   Specimen: BLOOD RIGHT ARM  Result Value Ref Range Status   Specimen Description   Final    BLOOD RIGHT ARM Performed at [facility], [address], [place], [place] [address]    Special Requests   Final    BOTTLES DRAWN AEROBIC AND ANAEROBIC Blood Culture adequate volume Performed at [facility], [address], [place], [place] [address]    Culture   Final    NO GROWTH 5 DAYS Performed at [facility], [address], [place], [place] [address]    Report Status 04/14/2023 FINAL  Final  Resp panel by RT-PCR (RSV, Flu A&B, Covid) Anterior Nasal Swab     Status: Abnormal   Collection Time: 04/08/23 10:50 PM   Specimen: Anterior Nasal Swab  Result Value Ref Range Status   SARS Coronavirus 2 by RT PCR NEGATIVE NEGATIVE Final    Comment: (NOTE) SARS-CoV-2 target nucleic acids are NOT DETECTED.  The SARS-CoV-2 RNA is generally detectable in upper respiratory specimens during the acute phase of infection. The lowest concentration of SARS-CoV-2 viral copies this assay can detect is 138 copies/mL. A negative result does not preclude SARS-Cov-2 infection and should not be used as the sole basis for treatment or other patient management decisions. A negative result may occur with  improper specimen collection/handling,  submission of specimen other than nasopharyngeal swab, presence of viral mutation(s) within the areas targeted by this assay, and inadequate number of viral copies(<138 copies/mL). A negative result must  be combined with clinical observations, patient history, and epidemiological information. The expected result is Negative.  Fact Sheet for Patients:  BloggerCourse.com  Fact Sheet for Healthcare Providers:  SeriousBroker.it  This test is no t yet approved or cleared by the [place] FDA and  has been authorized for detection and/or diagnosis of SARS-CoV-2 by FDA under an Emergency Use Authorization (EUA). This EUA will remain  in effect (meaning this test can be used) for the duration of the COVID-19 declaration under Section 564(b)(1) of the Act, 21 U.S.C.section 360bbb-3(b)(1), unless the authorization is terminated  or revoked sooner.       Influenza A by PCR POSITIVE (A) NEGATIVE Final   Influenza B by PCR NEGATIVE NEGATIVE Final    Comment: (NOTE) The Xpert Xpress SARS-CoV-2/FLU/RSV plus assay is intended as an aid in the diagnosis of influenza from Nasopharyngeal swab specimens and should not be used as a sole basis for treatment. Nasal washings and aspirates are unacceptable for Xpert Xpress SARS-CoV-2/FLU/RSV testing.  Fact Sheet for Patients: BloggerCourse.com  Fact Sheet for Healthcare Providers: SeriousBroker.it  This test is not yet approved or cleared by the [place] FDA and has been authorized for detection and/or diagnosis of SARS-CoV-2 by FDA under an Emergency Use Authorization (EUA). This EUA will remain in effect (meaning this test can be used) for the duration of the COVID-19 declaration under Section 564(b)(1) of the Act, 21 U.S.C. section 360bbb-3(b)(1), unless the authorization is terminated or revoked.     Resp Syncytial Virus by PCR  NEGATIVE NEGATIVE Final    Comment: (NOTE) Fact Sheet for Patients: BloggerCourse.com  Fact Sheet for Healthcare Providers: SeriousBroker.it  This test is not yet approved or cleared by the [place] FDA and has been authorized for detection and/or diagnosis of SARS-CoV-2 by FDA under an Emergency Use Authorization (EUA). This EUA will remain in effect (meaning this test can be used) for the duration of the COVID-19 declaration under Section 564(b)(1) of the Act, 21 U.S.C. section 360bbb-3(b)(1), unless the authorization is terminated or revoked.  Performed at [facility], [address], [place], [place] [address]      Radiology Studies: No results found.   Scheduled Meds:  aspirin EC  81 mg Oral QPM   carvedilol  12.5 mg Oral BID WC   enoxaparin (LOVENOX) injection  40 mg Subcutaneous Q24H   escitalopram  20 mg Oral Daily   hydrALAZINE  50 mg Oral TID   insulin aspart  0-20 Units Subcutaneous TID WC   insulin aspart  0-5 Units Subcutaneous QHS   insulin glargine  20 Units Subcutaneous QHS   potassium chloride  40 mEq Oral Q4H   sodium chloride flush  3 mL Intravenous Q12H   tamsulosin  0.4 mg Oral QPC supper   Continuous Infusions:   LOS: 6 days   Hughie Closs, MD [facility]  04/15/2023, 8:48 AM   *Please note that this is a verbal dictation therefore any spelling or grammatical errors are due to the "Dragon Medical One" system interpretation.  Please page via Amion and do not message via secure chat for urgent patient care matters. Secure chat can be used for non urgent patient care matters.  How to contact the [facility] Attending or Consulting provider 7A - 7P or covering provider during after hours 7P -7A, for this patient?  Check the care team in [facility] and look for a) attending/consulting TR[facility] provider listed and b) the [facility] team listed. Page or secure chat  7A-7P. Log into [email or web address] and  use Rosalie's universal password to access. If you do not have the password, please contact the hospital operator. Locate the [facility] provider you are looking for under [facility] and page to a number that you can be directly reached. If you still have difficulty reaching the provider, please page the [facility] (Director on Call) for the Hospitalists listed on amion for assistance.

## 2023-04-16 DIAGNOSIS — I5023 Acute on chronic systolic (congestive) heart failure: Secondary | ICD-10-CM | POA: Diagnosis not present

## 2023-04-16 LAB — BASIC METABOLIC PANEL
Anion gap: 11 (ref 5–15)
BUN: 36 mg/dL — ABNORMAL HIGH (ref 8–23)
CO2: 24 mmol/L (ref 22–32)
Calcium: 8.7 mg/dL — ABNORMAL LOW (ref 8.9–10.3)
Chloride: 101 mmol/L (ref 98–111)
Creatinine, Ser: 1.16 mg/dL (ref 0.61–1.24)
GFR, Estimated: >60 mL/min (ref >60)
Glucose, Bld: 163 mg/dL — ABNORMAL HIGH (ref 70–99)
Potassium: 3.7 mmol/L (ref 3.5–5.1)
Sodium: 136 mmol/L (ref 135–145)

## 2023-04-16 LAB — GLUCOSE, CAPILLARY
Glucose-Capillary: 181 mg/dL — ABNORMAL HIGH (ref 70–99)
Glucose-Capillary: 128 mg/dL — ABNORMAL HIGH (ref 70–99)
Glucose-Capillary: 115 mg/dL — ABNORMAL HIGH (ref 70–99)
Glucose-Capillary: 133 mg/dL — ABNORMAL HIGH (ref 70–99)

## 2023-04-16 LAB — MAGNESIUM: Magnesium: 2 mg/dL (ref 1.7–2.4)

## 2023-04-16 LAB — TSH: TSH: 2.751 u[IU]/mL (ref 0.350–4.500)

## 2023-04-16 MED ORDER — EMPAGLIFLOZIN 10 MG PO TABS
10.0000 mg | ORAL_TABLET | Freq: Every day | ORAL | Status: DC
  Administered 2023-04-16 – 2023-04-17 (×2): 10 mg via ORAL
  Filled 2023-04-16 (×2): qty 1

## 2023-04-16 MED ORDER — POTASSIUM CHLORIDE CRYS ER 20 MEQ PO TBCR
40.0000 meq | EXTENDED_RELEASE_TABLET | Freq: Once | ORAL | Status: AC
  Administered 2023-04-16: 40 meq via ORAL
  Filled 2023-04-16: qty 2

## 2023-04-16 MED ORDER — LOSARTAN POTASSIUM 50 MG PO TABS: 25.0000 mg | ORAL_TABLET | Freq: Every day | ORAL | Status: DC

## 2023-04-16 NOTE — Plan of Care (Signed)

## 2023-04-16 NOTE — Progress Notes (Signed)
 Mobility Specialist - Progress Note   04/16/23 0938  Mobility  Activity Ambulated with assistance in hallway  Level of Assistance Contact guard assist, steadying assist  Assistive Device Front wheel walker  Distance Ambulated (ft) 80 ft  Activity Response Tolerated well  Mobility Referral Yes  Mobility visit 1 Mobility  Mobility Specialist Start Time (ACUTE ONLY) 0930  Mobility Specialist Stop Time (ACUTE ONLY) [id number]  Mobility Specialist Time Calculation (min) (ACUTE ONLY) 8 min   Pt received in bed and agreeable to mobility. Pt was minA from STS & CG during ambulation. No complaints during session. Pt to recliner after session with all needs met.    [facility]

## 2023-04-16 NOTE — Progress Notes (Signed)
 Heart Failure Navigator Progress Note  Assessed for Heart & Vascular TOC clinic readiness.  Patient does not meet criteria due to patient has a scheduled [facility] appointment on 04/24/2023. .   Navigator will sign off at this time.   Rhae Hammock, BSN, Scientist, clinical (histocompatibility and immunogenetics) Only

## 2023-04-16 NOTE — Progress Notes (Addendum)
 Rounding Note    Patient Name: [name] Date of Encounter: 04/16/2023  [facility] Cardiologist: Donato Schultz, MD   Subjective   No significant overnight events. He has no specific complaints this morning. He states he has not had any more vomiting or diarrhea overnight. No chest pain, shortness of breath, palpitations. Renal function improving.  Inpatient Medications    Scheduled Meds:  aspirin EC  81 mg Oral QPM   carvedilol  12.5 mg Oral BID WC   enoxaparin (LOVENOX) injection  40 mg Subcutaneous Q24H   escitalopram  20 mg Oral Daily   hydrALAZINE  50 mg Oral TID   insulin aspart  0-20 Units Subcutaneous TID WC   insulin aspart  0-5 Units Subcutaneous QHS   insulin glargine  20 Units Subcutaneous QHS   sodium chloride flush  3 mL Intravenous Q12H   tamsulosin  0.4 mg Oral QPC supper   Continuous Infusions:  PRN Meds: acetaminophen **OR** acetaminophen, guaiFENesin, ipratropium-albuterol, ondansetron **OR** ondansetron (ZOFRAN) IV, senna-docusate, traMADol   Vital Signs    Vitals:   04/15/23 1955 04/15/23 2000 04/16/23 0524 04/16/23 0717  BP: 139/65  139/60   Pulse: (!) 46 62 (!) 46   Resp: 18  16   Temp: 98.5 F (36.9 C)  98.2 F (36.8 C)   TempSrc:   Oral   SpO2: 93%  95%   Weight:    103.4 kg  Height:        Intake/Output Summary (Last 24 hours) at 04/16/2023 0808 Last data filed at 04/16/2023 0421 Gross per 24 hour  Intake 577 ml  Output 1000 ml  Net -423 ml      04/16/2023    7:17 AM 04/15/2023    5:00 AM 04/14/2023    5:01 AM  Last 3 Weights  Weight (lbs) 228 lb 224 lb 13.9 oz 220 lb 3.8 oz  Weight (kg) 103.42 kg 102 kg 99.9 kg      Telemetry    Normal sinus rhythm with frequent PACs/ PVCs. One 10 beat run of NSVT also noted. - Personally Reviewed  ECG    No new ECG tracing today. - Personally Reviewed  Physical Exam   GEN: No acute distress.   Neck: No JVD. Cardiac: Irregular rhythm with normal rate. No murmurs, rubs, or  gallops.  Respiratory: No increased work of breathing. Clear to auscultation bilaterally. No wheezes, rhonchi, or rales. MS: No edema. No deformity. Neuro:  No focal deficits.  Psych: Normal affect. Responds appropriately.   Labs    High Sensitivity Troponin:   Recent Labs  Lab 04/08/23 2225 04/09/23 0015  TROPONINIHS 32* 37*     Chemistry Recent Labs  Lab 04/12/23 0628 04/13/23 0953 04/14/23 0603 04/15/23 0547 04/16/23 0543  NA 140   < > 134* 130* 136  K 3.5   < > 3.5 3.0* 3.7  CL 99   < > 95* 95* 101  CO2 30   < > 27 25 24   GLUCOSE 199*   < > 191* 132* 163*  BUN 42*   < > 54* 52* 36*  CREATININE 1.36*   < > 1.70* 1.53* 1.16  CALCIUM 9.8   < > 8.6* 8.4* 8.7*  MG 2.1  --   --   --   --   GFRNONAA 51*   < > 39* 44* >60  ANIONGAP 11   < > 12 10 11    < > = values in this interval not  displayed.    Lipids No results for input(s): "CHOL", "TRIG", "HDL", "LABVLDL", "LDLCALC", "CHOLHDL" in the last 168 hours.  Hematology Recent Labs  Lab 04/10/23 0526 04/11/23 0710 04/13/23 0953  WBC 7.2 13.6* 12.2*  RBC 4.63 4.43 5.36  HGB 13.6 13.0 16.0  HCT 43.3 40.5 50.2  MCV 93.5 91.4 93.7  MCH 29.4 29.3 29.9  MCHC 31.4 32.1 31.9  RDW 14.1 14.2 14.2  PLT 266 275 302   Thyroid No results for input(s): "TSH", "FREET4" in the last 168 hours.  BNPNo results for input(s): "BNP", "PROBNP" in the last 168 hours.  DDimer No results for input(s): "DDIMER" in the last 168 hours.   Radiology    No results found.  Cardiac Studies   Echocardiogram 04/09/2023: Impressions:   1. Left ventricular ejection fraction, by estimation, is 35 to 40%. The  left ventricle has moderately decreased function. The left ventricle  demonstrates global hypokinesis. The left ventricular internal cavity size  was mildly dilated. Left ventricular  diastolic parameters are consistent with Grade II diastolic dysfunction  (pseudonormalization).   2. Right ventricular systolic function is normal. The  right ventricular  size is moderately enlarged. Tricuspid regurgitation signal is inadequate  for assessing PA pressure.   3. Left atrial size was mildly dilated.   4. The mitral valve is normal in structure. Trivial mitral valve  regurgitation. No evidence of mitral stenosis.   5. The aortic valve was not well visualized. Aortic valve regurgitation  is not visualized. No aortic stenosis is present.   6. The inferior vena cava is dilated in size with <50% respiratory  variability, suggesting right atrial pressure of 15 mmHg.   7. Aortic dilatation noted. There is mild dilatation of the aortic root,  measuring 40 mm, but   8. Within normal limits for age when indexed to body surface area.   Comparison(s): Prior images reviewed side by side. Left ventricle if more  dilated from prior. Function appears worse (prior study uses  echo-contrast).    Patient Profile     89 y.o. male with a history of non-obstructive CAD on remote cardiac catheterization in 2004, non-ischemic cardiomyopathy/ chronic HFrEF with EF of 40-45% on Echo in 2022, hypertension , hyperlipidemia, type 2 diabetes mellitus, BPH, anxiety, and melanoma who presented on 04/08/2023 for flu like symptoms (generalized weakness, nausea, red tinged sputum) and was noted to be hypoxic. He did test positive for influenza A but he also was noted to be volume overloaded. He was admitted for acute hypoxic respiratory failure secondary to influenza A and acute on chronic CHF. Cardiology consulted to assist with management of CHF.  Assessment & Plan    Acute on Chronic HFrEF Non-Ischemic Cardiomyopathy Patient has a history of non-ischemic cardiomyopathy dating back to 2015 Stress test in 2014 was negative. Most recent EF prior to admission was 40-45% in 2022. She presented with flu like symptoms but was also noted to be volume overloaded. BNP elevated at 618. Chest x-ray showed enlarged cardiac silhouette and increased pulmonary vascular  congestion with patchy bilateral airspace disease felt to be consistent with CHF. Echo showed LVEF of 35-40% with global hypokinesis and grade 2 diastolic dysfunction, moderately enlarged RV with normal function, and no significant valvular disease. He was initially started on IV Lasix but then this was stopped due to worsening renal function (last dose was 3/2). Net negative 7.1 L this admission. Weight has been trending up the last few days. Renal function continues to improve.  -  Euvolemic on exam.  - Continue Coreg 12.5mg  twice daily. - Continue Hydralazine 50mg  three time daily.  - He is on Lisinopril-HCTZ at home. HCTZ was initially switched to Spironolactone due to reduced EF. However, both Lisinopril and Spironolactone has been held due to AKI. [place] Pepper has also been held for this reason. Renal function is much improved today. Vomiting/ diarrhea seem to have resolved. This is the first day he has been back to baseline. We can likely start to add back some of these medications but don't want to be too hasty with this. Will discuss gradually restarting GDMT with MD. - Continue to monitor volume status closely.   Elevated Troponin Non-Obstructive CAD Noted on remote cardiac catheterization in 2004. Myoview in 2014 was low risk. High-sensitivity troponin minimally elevated and flat this admission at 32 >> 27 not consistent with ACS. Echo as above. - No chest pain.  - Continue Aspirin 81mg  daily.  - He does not appear to be on a statin; however, do not think we have to be overly aggressive in starting given his age. - Troponin elevation consistent with demand ischemia in setting of acute illness. No plans for ischemic evaluation at this time.  Frequent PACs/ PVCs NSVT Telemetry shows frequent PACs/ PVCs as well as one 10 beat run of NSVT.  - Patient is asymptomatic with this. - Potassium 3.7 today. Will replete.  - Magnesium 2.1 on 3/2. Will recheck today. - Will check TSH.  - Continue  Coreg as above. - Continue to monitor on telemetry.  Hypertension BP well controlled. - Will manage in context of CHF as above.  AKI Baseline creatinine around 1.1 to 1.2. Peaked at 1.71 on 3/3 in setting of diuresis and nausea/ vomiting, diarrhea. Diuretics were held and he was treated with IV fluids. - Renal function has been improving over the last couple of days. Creatinine back down to 1.16 today and BUN 36. - See recommendation for restarting GDMT above. - Continue to monitor closely.   Otherwise, per primary team: - Influenza A - Acute metabolic encephalopathy - resolved - Nausea/ vomiting/ diarrhea - Hyperlipidemia - Type 2 diabetes mellitus - BPH - Anxiety     For questions or updates, please contact [facility] Please consult [email or web address] for contact info under        Signed, Corrin Parker, PA-C  04/16/2023, 8:08 AM     I have seen and examined the patient along with Corrin Parker, PA-C .  I have reviewed the chart, notes and new data.  I agree with PA/NP's note.  Key new complaints: feeling almost back to normal Key examination changes: euvolemic at a weight of 228 lb Key new findings / data: creatinine continues to improve  PLAN: He was taking amlodipine, hydralazine, ACEinh and thiazide before admission. LVEF is low. Will switch to losartan and low dose loop diuretic, anticipating transition to Entresto at office follow up.  Thurmon Fair, MD, [facility] [phone] 04/16/2023, 12:19 PM   [facility] will sign off.   Medication Recommendations:  Aspirin 81 mg daily  Carvedilol 12.5 mg twice daily Jardiance 10 mg daily Losartan 25 mg daily Furosemide 20 mg daily Stop amlodipine, lisinopril-HCTZ, hydralazine Other recommendations (labs, testing, etc):  daily weight monitoring, call us if weight exceeds 230 lb. Sodium restricted diet. Follow up as an outpatient:  scheduled 04/24/2023. We can recheck labs then.

## 2023-04-16 NOTE — Progress Notes (Signed)
 PROGRESS NOTE    [name]  VWU:[id number] DOB: May 23, 1934 DOA: 04/08/2023 PCP: Joaquim Nam, MD   Brief Narrative:  Mr. Niblack was admitted to the hospital with the working diagnosis of acute on chronic systolic heart failure exacerbation in the setting of acute influenza infection.    89 year old male with history of heart failure, insulin-dependent diabetes type 2, hypertension, hyperlipidemia, BPH, anxiety who presented from home with complaint of shortness of breath, cough.  Reported dyspnea on minimal exertion, weakness, and subjective fever at home.  EMS found him hypoxic in the range of 88% on room air.  On presentation, he had mild grade fever, required 4 L of oxygen per minute via nasal cannula.   Lab work showed potassium 3.1, creatinine 1.2, elevated BNP of 618.  Influenza A found to be positive.   Chest x-ray showed a large cardiac silhouette, increased pulmonary vasculature with patchy bilateral airspace disease.   Patient was placed on furosemide for diuresis and Oseltamivir for influenza.    Echocardiogram with reduced LV systolic function.   Assessment & Plan:   Principal Problem:   Acute on chronic systolic CHF (congestive heart failure) (HCC) Active Problems:   Influenza A   Essential hypertension   Hypokalemia   CAD (coronary artery disease)   Type 2 diabetes mellitus with vascular disease (HCC)   BPH with obstruction/lower urinary tract symptoms   Anxiety   Obesity, class 1  Acute hypoxic respiratory failure secondary to acute on chronic combined systolic and CHF (congestive heart failure) (HCC), POA: Echocardiogram with reduced LV systolic function with EF 35 to 40%, global hypokinesis, grade 1 diastolic dysfunction.  Chest x-ray also with pulmonary edema.  Cardiology following.  Patient started on Lasix IV 40 mg twice daily, Aldactone and Jardiance.  Due to AKI, all of those medications were held but Coreg was continued.  Finally his AKI has  resolved.  Cardiology is planning to likely rechallenge him with GDMT gradually.  Management per cardiology and appreciate their help.   Influenza A Symptoms improving, continue supportive medical therapy with Oseltamivir.    Essential hypertension Blood pressure very well-controlled, I will continue Coreg and hydralazine.  Losartan on hold.  AKI resolved.  Defer to cardiology for timing of resumption.  Nausea vomiting/diarrhea: Resolved.  Feels better.  Tolerating diet.  AKI: Upon extensive chart review, it appears that patient's baseline creatinine fluctuates but mostly ranges between 1-1.3.  Peaked to 1.71 04/14/2023, given 500 cc fluid, improved to only 1.53 on 04/15/2023, gave another dose of 500 cc fluid.  AKI has now resolved.  He appears euvolemic.   Hypokalemia Resolved.   CAD (coronary artery disease) No chest pain, no acute coronary syndrome.    Type 2 diabetes mellitus with vascular disease (HCC) Now blood sugar better controlled.  Patient on 20 units of Semglee at night and SSI.    BPH with obstruction/lower urinary tract symptoms Continue with tamsulosin.    Anxiety Continue with escitalopram.    Acute metabolic encephalopathy has resolved.  EEG negative for seizures.  Continue supportive medical therapy, PT and OT.    Obesity, class 1 Calculated BMI is 31.0, weight loss and diet modification counseled.  DVT prophylaxis: enoxaparin (LOVENOX) injection 40 mg Start: 04/09/23 1000   Code Status: Full Code  Family Communication: None present at bedside.  Plan of care discussed with patient in length and he/she verbalized understanding and agreed with it.  Status is: Inpatient Remains inpatient appropriate because: Cardiology planning to slowly rechallenge  him with GDMT.   Estimated body mass index is 30.92 kg/m as calculated from the following:   Height as of this encounter: 6' (1.829 m).   Weight as of this encounter: 103.4 kg.    Nutritional Assessment: Body  mass index is 30.92 kg/m.Marland Kitchen Seen by dietician.  I agree with the assessment and plan as outlined below: Nutrition Status:        . Skin Assessment: I have examined the patient's skin and I agree with the wound assessment as performed by the wound care RN as outlined below:    Consultants:  Cardiology  Procedures:  None  Antimicrobials:  Anti-infectives (From admission, onward)    Start     Dose/Rate Route Frequency Ordered Stop   04/09/23 1000  oseltamivir (TAMIFLU) capsule 75 mg  Status:  Discontinued        75 mg Oral 2 times daily 04/09/23 0107 04/09/23 0111   04/09/23 1000  oseltamivir (TAMIFLU) capsule 30 mg        30 mg Oral 2 times daily 04/09/23 0111 04/13/23 0858   04/08/23 2345  oseltamivir (TAMIFLU) capsule 75 mg        75 mg Oral  Once 04/08/23 2338 04/09/23 0010         Subjective: Patient seen and examined, sitting in the chair.  Feeling much better.  No complaints.  Objective: Vitals:   04/15/23 1955 04/15/23 2000 04/16/23 0524 04/16/23 0717  BP: 139/65  139/60   Pulse: (!) 46 62 (!) 46   Resp: 18  16   Temp: 98.5 F (36.9 C)  98.2 F (36.8 C)   TempSrc:   Oral   SpO2: 93%  95%   Weight:    103.4 kg  Height:        Intake/Output Summary (Last 24 hours) at 04/16/2023 1014 Last data filed at 04/16/2023 0900 Gross per 24 hour  Intake 737 ml  Output 1000 ml  Net -263 ml   Filed Weights   04/14/23 0501 04/15/23 0500 04/16/23 0717  Weight: 99.9 kg 102 kg 103.4 kg    Examination:  General exam: Appears calm and comfortable  Respiratory system: Clear to auscultation. Respiratory effort normal. Cardiovascular system: S1 & S2 heard, RRR. No JVD, murmurs, rubs, gallops or clicks. No pedal edema. Gastrointestinal system: Abdomen is nondistended, soft and nontender. No organomegaly or masses felt. Normal bowel sounds heard. Central nervous system: Alert and oriented. No focal neurological deficits. Extremities: Symmetric 5 x 5 power. Skin: No  rashes, lesions or ulcers.  Psychiatry: Judgement and insight appear normal. Mood & affect appropriate.   Data Reviewed: I have personally reviewed following labs and imaging studies  CBC: Recent Labs  Lab 04/10/23 0526 04/11/23 0710 04/13/23 0953  WBC 7.2 13.6* 12.2*  NEUTROABS  --   --  10.2*  HGB 13.6 13.0 16.0  HCT 43.3 40.5 50.2  MCV 93.5 91.4 93.7  PLT 266 275 302   Basic Metabolic Panel: Recent Labs  Lab 04/12/23 0628 04/13/23 0953 04/14/23 0603 04/15/23 0547 04/16/23 0543  NA 140 136 134* 130* 136  K 3.5 3.3* 3.5 3.0* 3.7  CL 99 93* 95* 95* 101  CO2 30 31 27 25 24   GLUCOSE 199* 229* 191* 132* 163*  BUN 42* 44* 54* 52* 36*  CREATININE 1.36* 1.71* 1.70* 1.53* 1.16  CALCIUM 9.8 9.5 8.6* 8.4* 8.7*  MG 2.1  --   --   --   --    GFR: Estimated Creatinine  Clearance: 56.8 mL/min (by C-G formula based on SCr of 1.16 mg/dL). Liver Function Tests: No results for input(s): "AST", "ALT", "ALKPHOS", "BILITOT", "PROT", "ALBUMIN" in the last 168 hours.  No results for input(s): "LIPASE", "AMYLASE" in the last 168 hours. No results for input(s): "AMMONIA" in the last 168 hours. Coagulation Profile: No results for input(s): "INR", "PROTIME" in the last 168 hours.  Cardiac Enzymes: No results for input(s): "CKTOTAL", "CKMB", "CKMBINDEX", "TROPONINI" in the last 168 hours. BNP (last 3 results) No results for input(s): "PROBNP" in the last 8760 hours. HbA1C: No results for input(s): "HGBA1C" in the last 72 hours. CBG: Recent Labs  Lab 04/15/23 0739 04/15/23 1133 04/15/23 1624 04/15/23 2100 04/16/23 0729  GLUCAP 127* 138* 79 134* 181*   Lipid Profile: No results for input(s): "CHOL", "HDL", "LDLCALC", "TRIG", "CHOLHDL", "LDLDIRECT" in the last 72 hours. Thyroid Function Tests: No results for input(s): "TSH", "T4TOTAL", "FREET4", "T3FREE", "THYROIDAB" in the last 72 hours. Anemia Panel: No results for input(s): "VITAMINB12", "FOLATE", "FERRITIN", "TIBC", "IRON",  "RETICCTPCT" in the last 72 hours. Sepsis Labs: No results for input(s): "PROCALCITON", "LATICACIDVEN" in the last 168 hours.   Recent Results (from the past 240 hours)  Blood Culture (routine x 2)     Status: None   Collection Time: 04/08/23 10:27 PM   Specimen: BLOOD RIGHT ARM  Result Value Ref Range Status   Specimen Description   Final    BLOOD RIGHT ARM Performed at [facility], [address], [place], [place] [address]    Special Requests   Final    BOTTLES DRAWN AEROBIC AND ANAEROBIC Blood Culture adequate volume Performed at [facility], [address] [place], [place] [address]    Culture   Final    NO GROWTH 5 DAYS Performed at [facility], [address], [place], [place] [address]    Report Status 04/14/2023 FINAL  Final  Resp panel by RT-PCR (RSV, Flu A&B, Covid) Anterior Nasal Swab     Status: Abnormal   Collection Time: 04/08/23 10:50 PM   Specimen: Anterior Nasal Swab  Result Value Ref Range Status   SARS Coronavirus 2 by RT PCR NEGATIVE NEGATIVE Final    Comment: (NOTE) SARS-CoV-2 target nucleic acids are NOT DETECTED.  The SARS-CoV-2 RNA is generally detectable in upper respiratory specimens during the acute phase of infection. The lowest concentration of SARS-CoV-2 viral copies this assay can detect is 138 copies/mL. A negative result does not preclude SARS-Cov-2 infection and should not be used as the sole basis for treatment or other patient management decisions. A negative result may occur with  improper specimen collection/handling, submission of specimen other than nasopharyngeal swab, presence of viral mutation(s) within the areas targeted by this assay, and inadequate number of viral copies(<138 copies/mL). A negative result must be combined with clinical observations, patient history, and epidemiological information. The expected result is Negative.  Fact Sheet for Patients:   BloggerCourse.com  Fact Sheet for Healthcare Providers:  SeriousBroker.it  This test is no t yet approved or cleared by the [place] FDA and  has been authorized for detection and/or diagnosis of SARS-CoV-2 by FDA under an Emergency Use Authorization (EUA). This EUA will remain  in effect (meaning this test can be used) for the duration of the COVID-19 declaration under Section 564(b)(1) of the Act, 21 U.S.C.section 360bbb-3(b)(1), unless the authorization is terminated  or revoked sooner.       Influenza A by PCR POSITIVE (A) NEGATIVE  Final   Influenza B by PCR NEGATIVE NEGATIVE Final    Comment: (NOTE) The Xpert Xpress SARS-CoV-2/FLU/RSV plus assay is intended as an aid in the diagnosis of influenza from Nasopharyngeal swab specimens and should not be used as a sole basis for treatment. Nasal washings and aspirates are unacceptable for Xpert Xpress SARS-CoV-2/FLU/RSV testing.  Fact Sheet for Patients: BloggerCourse.com  Fact Sheet for Healthcare Providers: SeriousBroker.it  This test is not yet approved or cleared by the [place] FDA and has been authorized for detection and/or diagnosis of SARS-CoV-2 by FDA under an Emergency Use Authorization (EUA). This EUA will remain in effect (meaning this test can be used) for the duration of the COVID-19 declaration under Section 564(b)(1) of the Act, 21 U.S.C. section 360bbb-3(b)(1), unless the authorization is terminated or revoked.     Resp Syncytial Virus by PCR NEGATIVE NEGATIVE Final    Comment: (NOTE) Fact Sheet for Patients: BloggerCourse.com  Fact Sheet for Healthcare Providers: SeriousBroker.it  This test is not yet approved or cleared by the [place] FDA and has been authorized for detection and/or diagnosis of SARS-CoV-2 by FDA under an Emergency Use  Authorization (EUA). This EUA will remain in effect (meaning this test can be used) for the duration of the COVID-19 declaration under Section 564(b)(1) of the Act, 21 U.S.C. section 360bbb-3(b)(1), unless the authorization is terminated or revoked.  Performed at [facility], [address] [place], [place] [address]      Radiology Studies: No results found.   Scheduled Meds:  aspirin EC  81 mg Oral QPM   carvedilol  12.5 mg Oral BID WC   enoxaparin (LOVENOX) injection  40 mg Subcutaneous Q24H   escitalopram  20 mg Oral Daily   hydrALAZINE  50 mg Oral TID   insulin aspart  0-20 Units Subcutaneous TID WC   insulin aspart  0-5 Units Subcutaneous QHS   insulin glargine  20 Units Subcutaneous QHS   potassium chloride  40 mEq Oral Once   sodium chloride flush  3 mL Intravenous Q12H   tamsulosin  0.4 mg Oral QPC supper   Continuous Infusions:   LOS: 7 days   Hughie Closs, MD [facility]  04/16/2023, 10:14 AM   *Please note that this is a verbal dictation therefore any spelling or grammatical errors are due to the "Dragon Medical One" system interpretation.  Please page via Amion and do not message via secure chat for urgent patient care matters. Secure chat can be used for non urgent patient care matters.  How to contact the [facility] Attending or Consulting provider 7A - 7P or covering provider during after hours 7P -7A, for this patient?  Check the care team in [facility] and look for a) attending/consulting [facility] provider listed and b) the [facility] team listed. Page or secure chat 7A-7P. Log into [email or web address] and use [facility]'s universal password to access. If you do not have the password, please contact the hospital operator. Locate the [facility] provider you are looking for under [facility] and page to a number that you can be directly reached. If you still have difficulty reaching the provider, please page the [facility] (Director on Call) for the Hospitalists listed  on amion for assistance.

## 2023-04-17 ENCOUNTER — Other Ambulatory Visit (HOSPITAL_COMMUNITY): Payer: Self-pay

## 2023-04-17 DIAGNOSIS — I5023 Acute on chronic systolic (congestive) heart failure: Secondary | ICD-10-CM | POA: Diagnosis not present

## 2023-04-17 LAB — BASIC METABOLIC PANEL
Anion gap: 8 (ref 5–15)
BUN: 29 mg/dL — ABNORMAL HIGH (ref 8–23)
CO2: 24 mmol/L (ref 22–32)
Calcium: 8.6 mg/dL — ABNORMAL LOW (ref 8.9–10.3)
Chloride: 105 mmol/L (ref 98–111)
Creatinine, Ser: 1.18 mg/dL (ref 0.61–1.24)
GFR, Estimated: >60 mL/min (ref >60)
Glucose, Bld: 110 mg/dL — ABNORMAL HIGH (ref 70–99)
Potassium: 4.9 mmol/L (ref 3.5–5.1)
Sodium: 137 mmol/L (ref 135–145)

## 2023-04-17 LAB — GLUCOSE, CAPILLARY
Glucose-Capillary: 109 mg/dL — ABNORMAL HIGH (ref 70–99)
Glucose-Capillary: 152 mg/dL — ABNORMAL HIGH (ref 70–99)

## 2023-04-17 MED ORDER — EMPAGLIFLOZIN 10 MG PO TABS
10.0000 mg | ORAL_TABLET | Freq: Every day | ORAL | 0 refills | Status: AC
  Filled 2023-04-17: qty 30, 30d supply, fill #0

## 2023-04-17 MED ORDER — TOUJEO SOLOSTAR 300 UNIT/ML ~~LOC~~ SOPN: 20.0000 [IU] | PEN_INJECTOR | Freq: Every day | SUBCUTANEOUS | Status: DC

## 2023-04-17 MED ORDER — POTASSIUM CHLORIDE ER 10 MEQ PO TBCR
10.0000 meq | EXTENDED_RELEASE_TABLET | Freq: Every day | ORAL | 0 refills | Status: AC
  Filled 2023-04-17: qty 30, 30d supply, fill #0

## 2023-04-17 MED ORDER — LOSARTAN POTASSIUM 25 MG PO TABS
25.0000 mg | ORAL_TABLET | Freq: Every day | ORAL | 0 refills | Status: DC
  Filled 2023-04-17: qty 30, 30d supply, fill #0

## 2023-04-17 MED ORDER — FUROSEMIDE 20 MG PO TABS
20.0000 mg | ORAL_TABLET | Freq: Every day | ORAL | 0 refills | Status: DC
Start: 2023-04-17 — End: 2023-06-11
  Filled 2023-04-17: qty 30, 30d supply, fill #0

## 2023-04-17 NOTE — Plan of Care (Signed)
  Problem: Nutritional: Goal: Maintenance of adequate nutrition will improve Outcome: Progressing   Problem: Tissue Perfusion: Goal: Adequacy of tissue perfusion will improve Outcome: Progressing   Problem: Clinical Measurements: Goal: Respiratory complications will improve Outcome: Progressing

## 2023-04-17 NOTE — Progress Notes (Signed)
 Patient medications delivered form [facility] outpatient pharmacy by this RN.

## 2023-04-17 NOTE — Discharge Summary (Signed)
 Physician Discharge Summary  [name]:[id number] DOB: [birth year] DOA: 04/08/2023  PCP: Joaquim Nam, MD  Admit date: 04/08/2023 Discharge date: 04/17/2023 30 Day Unplanned Readmission Risk Score    Flowsheet Row ED to Hosp-Admission (Current) from 04/08/2023 in [facility]  30 Day Unplanned Readmission Risk Score (%) 20.88 Filed at 04/17/2023 0801       This score is the patient's risk of an unplanned readmission within 30 days of being discharged (0 -100%). The score is based on dignosis, age, lab data, medications, orders, and past utilization.   Low:  0-14.9   Medium: 15-21.9   High: 22-29.9   Extreme: 30 and above          Admitted From: Home Disposition: Home  Recommendations for Outpatient Follow-up:  Follow up with PCP in 1-2 weeks Please obtain BMP/CBC in one week Please follow up with your PCP on the following pending results: Unresulted Labs (From admission, onward)    None         Home Health: Yes Equipment/Devices: None  Discharge Condition: Stable CODE STATUS: Full code Diet recommendation: Low-sodium  Subjective: Patient seen and examined, he has no complaints.  He wants to go home today.  Brief/Interim Summary: Mr. Mathey is 89 year old male with history of heart failure, insulin-dependent diabetes type 2, hypertension, hyperlipidemia, BPH, anxiety who presented from home with complaint of shortness of breath, cough.  Reported dyspnea on minimal exertion, weakness, and subjective fever at home.  EMS found him hypoxic in the range of 88% on room air.  On presentation, he had mild grade fever, required 4 L of oxygen per minute via nasal cannula.  Lab work showed potassium 3.1, creatinine 1.2, elevated BNP of 618.  Influenza A found to be positive.  Chest x-ray showed a large cardiac silhouette, increased pulmonary vasculature with patchy bilateral airspace disease.  Patient was admitted under hospital  service for the following.  Acute hypoxic respiratory failure secondary to acute on chronic combined systolic and CHF (congestive heart failure) (HCC), POA: Previous echo was done in February 2022 which showed EF of 40 to 45%, echocardiogram this hospitalization with reduced LV systolic function with EF 35 to 40%, global hypokinesis, grade 1 diastolic dysfunction.  Chest x-ray also with pulmonary edema.  Cardiology followed.  Patient started on Lasix IV 40 mg twice daily, Aldactone and Jardiance.  Due to AKI, all of those medications were held but Coreg was continued.  Finally his AKI has resolved.  Cardiology resumed losartan, Lasix and Jardiance and cleared him for discharge.    Influenza A Symptoms completely resolved now.  He completed Tamiflu.   Essential hypertension Blood pressure very well-controlled, cardiology recommended discontinuing amlodipine, hydrochlorothiazide and hydralazine, patient is being discharged on Coreg and losartan.    Nausea vomiting/diarrhea: Resolved.  Feels better.  Tolerating diet.   AKI: Upon extensive chart review, it appears that patient's baseline creatinine fluctuates but mostly ranges between 1-1.3.  Peaked to 1.71 04/14/2023, given 500 cc fluid, improved to only 1.53 on 04/15/2023, gave another dose of 500 cc fluid.  AKI has now resolved.  He appears euvolemic.   Hypokalemia Resolved.  Starting on supplementation because he is going to be on Lasix.   CAD (coronary artery disease) No chest pain, no acute coronary syndrome.    Type 2 diabetes mellitus with vascular disease (HCC) Now blood sugar better controlled.  Patient on 20 units of Semglee at night and SSI.  He was  on 16 units prior to admission, this has been updated to 20 units at discharge.   BPH with obstruction/lower urinary tract symptoms Continue with tamsulosin.    Anxiety Continue with escitalopram.    Acute metabolic encephalopathy has resolved.  EEG negative for seizures.  Continue  supportive medical therapy, PT and OT.    Obesity, class 1 Calculated BMI is 31.0, weight loss and diet modification counseled.  Discharge Diagnoses:  Principal Problem:   Acute on chronic systolic CHF (congestive heart failure) (HCC) Active Problems:   Influenza A   Essential hypertension   Hypokalemia   CAD (coronary artery disease)   Type 2 diabetes mellitus with vascular disease (HCC)   BPH with obstruction/lower urinary tract symptoms   Anxiety   Obesity, class 1    Discharge Instructions   Allergies as of 04/17/2023       Reactions   Morphine Nausea Only, Other (See Comments), Nausea And Vomiting   Can take with food and usually doesn't cause nausea   Atorvastatin    Intolerant - nausea/myalgias   Codeine Phosphate Nausea And Vomiting   Metformin And Related Diarrhea        Medication List     STOP taking these medications    amLODipine 10 MG tablet Commonly known as: NORVASC   BENADRYL ALLERGY PO   lisinopril-hydrochlorothiazide 20-25 MG tablet Commonly known as: ZESTORETIC       TAKE these medications    Accu-Chek Aviva Plus w/Device Kit Check sugar up to 3 times daily. E11.59  Insulin dependent   Accu-Chek Aviva Soln Use as directed monthly. E11.59  Insulin dependent   Accu-Chek Softclix Lancets lancets TEST BLOOD SUGAR UP TO THREE TIMES DAILY   aspirin EC 81 MG tablet Take 81 mg by mouth every evening.   B-D 3CC LUER-LOK SYR 25GX1" 25G X 1" 3 ML Misc Generic drug: SYRINGE-NEEDLE (DISP) 3 ML USE TO INJECT VITAMIN B12 MONTHLY.   carvedilol 12.5 MG tablet Commonly known as: COREG TAKE 1 TABLET BY MOUTH TWICE  DAILY WITH MEALS   COD LIVER OIL PO Take 1 capsule by mouth 2 (two) times daily.   cyanocobalamin 1000 MCG/ML injection Commonly known as: VITAMIN B12 Inject 1 mL (1,000 mcg total) into the muscle every 30 (thirty) days. Dispense 3 of the 1 mL vials.   Droplet Pen Needles 30G X 8 MM Misc Generic drug: Insulin Pen Needle USE   WITH  INSULIN  PEN   empagliflozin 10 MG Tabs tablet Commonly known as: JARDIANCE Take 1 tablet (10 mg total) by mouth daily. Start taking on: April 18, 2023   escitalopram 20 MG tablet Commonly known as: Lexapro Take 1 tablet (20 mg total) by mouth daily.   FreeStyle Libre 3 Reader [place] 1 each by Does not apply route as directed.   FreeStyle Libre 3 Sensor Misc Place 1 sensor on the skin every 14 days. Use to check glucose continuously   furosemide 20 MG tablet Commonly known as: Lasix Take 1 tablet (20 mg total) by mouth daily.   hydrALAZINE 50 MG tablet Commonly known as: APRESOLINE TAKE 1 TABLET BY MOUTH 3 TIMES  DAILY   insulin lispro 100 UNIT/ML KwikPen Commonly known as: HumaLOG KwikPen INJECT SUBCUTANEOUSLY 4-10 UNITS 3 TIMES DAILY BEFORE MEALS PER  SLIDING SCALE: 151-200 = 4 UN,  201-250 = 6 UN, 251-300 = 8 UN,  AND OVER 300 = 10 UN   losartan 25 MG tablet Commonly known as: Cozaar Take 1 tablet (25  mg total) by mouth daily.   meclizine 12.5 MG tablet Commonly known as: ANTIVERT Take 1 tablet (12.5 mg total) by mouth 3 (three) times daily as needed for dizziness.   naproxen sodium 220 MG tablet Commonly known as: ALEVE Take 220 mg by mouth daily as needed.   ondansetron 4 MG tablet Commonly known as: ZOFRAN Take 1 tablet (4 mg total) by mouth every 6 (six) hours.   OneTouch Ultra test strip Generic drug: glucose blood USE TO CHECK UP TO 3 TIMES DAILY AS NEEDED   potassium chloride 10 MEQ tablet Commonly known as: KLOR-CON M Take 1 tablet (10 mEq total) by mouth daily. What changed:  medication strength how much to take   tamsulosin 0.4 MG Caps capsule Commonly known as: FLOMAX TAKE 1 CAPSULE BY MOUTH DAILY  AFTER SUPPER   [name] SoloStar 300 UNIT/ML Solostar Pen Generic drug: insulin glargine (1 Unit Dial) Inject 20 Units into the skin at bedtime. INJECT SUBCUTANEOUSLY 16  UNITS AT BEDTIME What changed:  how much to take how to take this when  to take this   traMADol 50 MG tablet Commonly known as: ULTRAM TAKE 1 TABLET BY MOUTH EVERY 12 HOURS AS NEEDED FOR KNEE PAIN.        Follow-up Information     [facility] Follow up.   Specialty: Home Health Services Why: Frances Furbish will follow up with you at discharge to provide home health services Contact information: [address] [place] [address] [phone]         Joaquim Nam, MD Follow up in 1 week(s).   Specialty: Family Medicine Contact information: [address] [place] [address] [phone]                Allergies  Allergen Reactions   Morphine Nausea Only, Other (See Comments) and Nausea And Vomiting    Can take with food and usually doesn't cause nausea   Atorvastatin     Intolerant - nausea/myalgias   Codeine Phosphate Nausea And Vomiting   Metformin And Related Diarrhea    Consultations: Cardiology   Procedures/Studies: EEG adult Result Date: 04/10/2023 Charlsie Quest, MD     04/10/2023  6:26 PM Patient Name: [name] MRN: [id number] Epilepsy Attending: Charlsie Quest Referring Physician/Provider: Burnadette Pop, MD Date: 04/10/2023 Duration: 24.15 mins Patient history: 89 yo male patient had sudden onset of jerky movements in all his extremities this morning. EEG to evaluate for seizure Level of alertness: Awake AEDs during EEG study: Ativan Technical aspects: This EEG study was done with scalp electrodes positioned according to the 10-20 International system of electrode placement. Electrical activity was reviewed with band pass filter of 1-70Hz , sensitivity of 7 uV/mm, display speed of 42mm/sec with a 60Hz  notched filter applied as appropriate. EEG data were recorded continuously and digitally stored.  Video monitoring was available and reviewed as appropriate. Description: The posterior dominant rhythm consists of 9 Hz activity of moderate voltage (25-35 uV) seen predominantly in posterior  head regions, symmetric and reactive to eye opening and eye closing. Hyperventilation and photic stimulation were not performed.   IMPRESSION: This study is within normal limits. No seizures or epileptiform discharges were seen throughout the recording. A normal interictal EEG does not exclude  the diagnosis of epilepsy. Charlsie Quest   ECHOCARDIOGRAM COMPLETE Result Date: 04/09/2023    ECHOCARDIOGRAM REPORT   Patient Name:   [name] Date of Exam: 04/09/2023 Medical  Rec #:  [id number]       Height:       72.0 in Accession #:    [id number]      Weight:       225.2 lb Date of Birth:  [birth year]      BSA:          2.241 m Patient Age:    88 years        BP:           138/67 mmHg Patient Gender: M               HR:           61 bpm. Exam Location:  Inpatient Procedure: 2D Echo, Cardiac Doppler and Color Doppler (Both Spectral and Color            Flow Doppler were utilized during procedure). Indications:    Chest Pain  History:        Patient has prior history of Echocardiogram examinations, most                 recent 04/02/2020. CAD; Risk Factors:Hypertension, Diabetes and                 Dyslipidemia.  Sonographer:    Karma Ganja Referring Phys: [id number] VISHAL R PATEL IMPRESSIONS  1. Left ventricular ejection fraction, by estimation, is 35 to 40%. The left ventricle has moderately decreased function. The left ventricle demonstrates global hypokinesis. The left ventricular internal cavity size was mildly dilated. Left ventricular diastolic parameters are consistent with Grade II diastolic dysfunction (pseudonormalization).  2. Right ventricular systolic function is normal. The right ventricular size is moderately enlarged. Tricuspid regurgitation signal is inadequate for assessing PA pressure.  3. Left atrial size was mildly dilated.  4. The mitral valve is normal in structure. Trivial mitral valve regurgitation. No evidence of mitral stenosis.  5. The aortic valve was not well visualized. Aortic valve  regurgitation is not visualized. No aortic stenosis is present.  6. The inferior vena cava is dilated in size with <50% respiratory variability, suggesting right atrial pressure of 15 mmHg.  7. Aortic dilatation noted. There is mild dilatation of the aortic root, measuring 40 mm, but  8. Within normal limits for age when indexed to body surface area. Comparison(s): Prior images reviewed side by side. Left ventricle if more dilated from prior. Function appears worse (prior study uses echo-contrast). FINDINGS  Left Ventricle: Left ventricular ejection fraction, by estimation, is 35 to 40%. The left ventricle has moderately decreased function. The left ventricle demonstrates global hypokinesis. Strain imaging was not performed. The left ventricular internal cavity size was mildly dilated. There is no left ventricular hypertrophy. Left ventricular diastolic parameters are consistent with Grade II diastolic dysfunction (pseudonormalization). Right Ventricle: The right ventricular size is moderately enlarged. No increase in right ventricular wall thickness. Right ventricular systolic function is normal. Tricuspid regurgitation signal is inadequate for assessing PA pressure. Left Atrium: Left atrial size was mildly dilated. Right Atrium: Right atrial size was normal in size. Pericardium: There is no evidence of pericardial effusion. Mitral Valve: The mitral valve is normal in structure. Trivial mitral valve regurgitation. No evidence of mitral valve stenosis. Tricuspid Valve: The tricuspid valve is normal in structure. Tricuspid valve regurgitation is not demonstrated. No evidence of tricuspid stenosis. Aortic Valve: The aortic valve was not well visualized. Aortic valve regurgitation is not visualized. No aortic stenosis is present. Aortic valve mean gradient measures 3.0 mmHg. Aortic valve  peak gradient measures 6.6 mmHg. Aortic valve area, by VTI measures 1.38 cm. Pulmonic Valve: The pulmonic valve was not well  visualized. Pulmonic valve regurgitation is not visualized. No evidence of pulmonic stenosis. Aorta: Aortic dilatation noted. Ascending aorta measurements are within normal limits for age when indexed to body surface area. There is mild dilatation of the aortic root, measuring 40 mm. Venous: The inferior vena cava is dilated in size with less than 50% respiratory variability, suggesting right atrial pressure of 15 mmHg. IAS/Shunts: The atrial septum is grossly normal. Additional Comments: 3D imaging was not performed.  LEFT VENTRICLE PLAX 2D LVIDd:         6.50 cm      Diastology LVIDs:         5.30 cm      LV e' medial:    5.22 cm/s LV PW:         0.90 cm      LV E/e' medial:  15.3 LV IVS:        0.80 cm      LV e' lateral:   6.31 cm/s LVOT diam:     1.90 cm      LV E/e' lateral: 12.7 LV SV:         40 LV SV Index:   18 LVOT Area:     2.84 cm                              3D Volume EF: LV Volumes (MOD)            LV EDV:       234 ml LV vol d, MOD A2C: 194.0 ml LV ESV:       112 ml LV vol d, MOD A4C: 180.0 ml LV SV:        122 ml LV vol s, MOD A2C: 124.0 ml LV vol s, MOD A4C: 106.0 ml LV SV MOD A2C:     70.0 ml LV SV MOD A4C:     180.0 ml LV SV MOD BP:      70.7 ml RIGHT VENTRICLE            IVC RV Basal diam:  4.60 cm    IVC diam: 2.20 cm RV S prime:     8.49 cm/s TAPSE (M-mode): 2.0 cm LEFT ATRIUM              Index        RIGHT ATRIUM           Index LA diam:        4.60 cm  2.05 cm/m   RA Area:     19.00 cm LA Vol (A2C):   119.0 ml 53.11 ml/m  RA Volume:   51.90 ml  23.16 ml/m LA Vol (A4C):   54.5 ml  24.32 ml/m LA Biplane Vol: 80.1 ml  35.75 ml/m  AORTIC VALVE AV Area (Vmax):    1.59 cm AV Area (Vmean):   1.41 cm AV Area (VTI):     1.38 cm AV Vmax:           128.00 cm/s AV Vmean:          83.400 cm/s AV VTI:            0.287 m AV Peak Grad:      6.6 mmHg AV Mean Grad:      3.0 mmHg LVOT Vmax:  71.80 cm/s LVOT Vmean:        41.500 cm/s LVOT VTI:          0.140 m LVOT/AV VTI ratio: 0.49  AORTA Ao  Root diam: 4.00 cm MITRAL VALVE MV Area (PHT): 2.91 cm    SHUNTS MV Decel Time: 261 msec    Systemic VTI:  0.14 m MV E velocity: 80.10 cm/s  Systemic Diam: 1.90 cm MV A velocity: 55.30 cm/s MV E/A ratio:  1.45 Riley Lam MD Electronically signed by Riley Lam MD Signature Date/Time: 04/09/2023/1:57:00 PM    Final    DG Chest Port 1 View Result Date: 04/08/2023 CLINICAL DATA:  Fever, weakness, hypoxia EXAM: PORTABLE CHEST 1 VIEW COMPARISON:  01/22/2023 FINDINGS: Single frontal view of the chest demonstrates a stable enlarged cardiac silhouette. There is increased pulmonary vascular congestion, with patchy bilateral airspace disease. No effusion or pneumothorax. No acute bony abnormalities. IMPRESSION: 1. Constellation of findings most consistent with congestive heart failure. Underlying pneumonia cannot be excluded. Electronically Signed   By: Sharlet Salina M.D.   On: 04/08/2023 22:19   MR BRAIN WO CONTRAST Result Date: 03/24/2023 CLINICAL DATA:  89 year old male neurologic deficit. Abnormal intracranial MRA on 03/15/2023 including occluded distal right vertebral artery, proximal basilar artery filling defect. EXAM: MRI HEAD WITHOUT CONTRAST TECHNIQUE: Multiplanar, multiecho pulse sequences of the brain and surrounding structures were obtained without intravenous contrast. COMPARISON:  Brain MRI 03/06/2023, intracranial MRA 03/15/2023, and earlier. FINDINGS: Brain: No restricted diffusion to suggest acute infarction. No midline shift, mass effect, evidence of mass lesion, ventriculomegaly, extra-axial collection or acute intracranial hemorrhage. Cervicomedullary junction and pituitary are within normal limits. Stable and largely normal for age gray and white matter signal. Stable cerebral volume. No cortical encephalomalacia or chronic cerebral blood products identified. Vascular: Major intracranial vascular flow voids are stable compared to 03/06/2023. Skull and upper cervical spine:  Stable degenerative appearing ligamentous hypertrophy about the odontoid process. Visualized bone marrow signal is within normal limits. Sinuses/Orbits: Stable, negative. Other: Mild right mastoid effusion is stable. Negative visible nasopharynx. Negative visible scalp and face. IMPRESSION: 1. No acute intracranial abnormality. 2. Stable abnormal distal right vertebral artery flow void since January, corresponding to MRA finding this month. Electronically Signed   By: Odessa Fleming M.D.   On: 03/24/2023 17:06     Discharge Exam: Vitals:   04/16/23 1926 04/17/23 0523  BP: (!) 177/68 (!) 148/61  Pulse: (!) 50 (!) 44  Resp: 18 19  Temp: 98 F (36.7 C) (!) 97.5 F (36.4 C)  SpO2: 96% 94%   Vitals:   04/16/23 0717 04/16/23 1131 04/16/23 1926 04/17/23 0523  BP:  (!) 157/67 (!) 177/68 (!) 148/61  Pulse:  (!) 44 (!) 50 (!) 44  Resp:   18 19  Temp:  98.4 F (36.9 C) 98 F (36.7 C) (!) 97.5 F (36.4 C)  TempSrc:  Oral    SpO2:  96% 96% 94%  Weight: 103.4 kg     Height:        General: Pt is alert, awake, not in acute distress Cardiovascular: RRR, S1/S2 +, no rubs, no gallops Respiratory: CTA bilaterally, no wheezing, no rhonchi Abdominal: Soft, NT, ND, bowel sounds + Extremities: no edema, no cyanosis    The results of significant diagnostics from this hospitalization (including imaging, microbiology, ancillary and laboratory) are listed below for reference.     Microbiology: Recent Results (from the past 240 hours)  Blood Culture (routine x 2)     Status:  None   Collection Time: 04/08/23 10:27 PM   Specimen: BLOOD RIGHT ARM  Result Value Ref Range Status   Specimen Description   Final    BLOOD RIGHT ARM Performed at [facility], [address] [place], [place] [address]    Special Requests   Final    BOTTLES DRAWN AEROBIC AND ANAEROBIC Blood Culture adequate volume Performed at [facility], [address] [place], [place] [address]     Culture   Final    NO GROWTH 5 DAYS Performed at [facility], [address], [place], [place] [address]    Report Status 04/14/2023 FINAL  Final  Resp panel by RT-PCR (RSV, Flu A&B, Covid) Anterior Nasal Swab     Status: Abnormal   Collection Time: 04/08/23 10:50 PM   Specimen: Anterior Nasal Swab  Result Value Ref Range Status   SARS Coronavirus 2 by RT PCR NEGATIVE NEGATIVE Final    Comment: (NOTE) SARS-CoV-2 target nucleic acids are NOT DETECTED.  The SARS-CoV-2 RNA is generally detectable in upper respiratory specimens during the acute phase of infection. The lowest concentration of SARS-CoV-2 viral copies this assay can detect is 138 copies/mL. A negative result does not preclude SARS-Cov-2 infection and should not be used as the sole basis for treatment or other patient management decisions. A negative result may occur with  improper specimen collection/handling, submission of specimen other than nasopharyngeal swab, presence of viral mutation(s) within the areas targeted by this assay, and inadequate number of viral copies(<138 copies/mL). A negative result must be combined with clinical observations, patient history, and epidemiological information. The expected result is Negative.  Fact Sheet for Patients:  BloggerCourse.com  Fact Sheet for Healthcare Providers:  SeriousBroker.it  This test is no t yet approved or cleared by the [place] FDA and  has been authorized for detection and/or diagnosis of SARS-CoV-2 by FDA under an Emergency Use Authorization (EUA). This EUA will remain  in effect (meaning this test can be used) for the duration of the COVID-19 declaration under Section 564(b)(1) of the Act, 21 U.S.C.section 360bbb-3(b)(1), unless the authorization is terminated  or revoked sooner.       Influenza A by PCR POSITIVE (A) NEGATIVE Final   Influenza B by PCR NEGATIVE NEGATIVE Final    Comment:  (NOTE) The Xpert Xpress SARS-CoV-2/FLU/RSV plus assay is intended as an aid in the diagnosis of influenza from Nasopharyngeal swab specimens and should not be used as a sole basis for treatment. Nasal washings and aspirates are unacceptable for Xpert Xpress SARS-CoV-2/FLU/RSV testing.  Fact Sheet for Patients: BloggerCourse.com  Fact Sheet for Healthcare Providers: SeriousBroker.it  This test is not yet approved or cleared by the [place] FDA and has been authorized for detection and/or diagnosis of SARS-CoV-2 by FDA under an Emergency Use Authorization (EUA). This EUA will remain in effect (meaning this test can be used) for the duration of the COVID-19 declaration under Section 564(b)(1) of the Act, 21 U.S.C. section 360bbb-3(b)(1), unless the authorization is terminated or revoked.     Resp Syncytial Virus by PCR NEGATIVE NEGATIVE Final    Comment: (NOTE) Fact Sheet for Patients: BloggerCourse.com  Fact Sheet for Healthcare Providers: SeriousBroker.it  This test is not yet approved or cleared by the [place] FDA and has been authorized for detection and/or diagnosis of SARS-CoV-2 by FDA under an Emergency Use Authorization (EUA). This EUA will remain in effect (meaning this test can be used) for the duration  of the COVID-19 declaration under Section 564(b)(1) of the Act, 21 U.S.C. section 360bbb-3(b)(1), unless the authorization is terminated or revoked.  Performed at [facility], [address] [place], [place] [address]      Labs: BNP (last 3 results) Recent Labs    11/17/22 1814 01/22/23 1349 04/08/23 2225  BNP 132.4* 247.4* 618.2*   Basic Metabolic Panel: Recent Labs  Lab 04/12/23 0628 04/13/23 0953 04/14/23 0603 04/15/23 0547 04/16/23 0543 04/17/23 0511  NA 140 136 134* 130* 136 137  K 3.5 3.3* 3.5 3.0* 3.7 4.9  CL 99  93* 95* 95* 101 105  CO2 30 31 27 25 24 24   GLUCOSE 199* 229* 191* 132* 163* 110*  BUN 42* 44* 54* 52* 36* 29*  CREATININE 1.36* 1.71* 1.70* 1.53* 1.16 1.18  CALCIUM 9.8 9.5 8.6* 8.4* 8.7* 8.6*  MG 2.1  --   --   --  2.0  --    Liver Function Tests: No results for input(s): "AST", "ALT", "ALKPHOS", "BILITOT", "PROT", "ALBUMIN" in the last 168 hours. No results for input(s): "LIPASE", "AMYLASE" in the last 168 hours. No results for input(s): "AMMONIA" in the last 168 hours. CBC: Recent Labs  Lab 04/11/23 0710 04/13/23 0953  WBC 13.6* 12.2*  NEUTROABS  --  10.2*  HGB 13.0 16.0  HCT 40.5 50.2  MCV 91.4 93.7  PLT 275 302   Cardiac Enzymes: No results for input(s): "CKTOTAL", "CKMB", "CKMBINDEX", "TROPONINI" in the last 168 hours. BNP: Invalid input(s): "POCBNP" CBG: Recent Labs  Lab 04/16/23 0729 04/16/23 1128 04/16/23 1657 04/16/23 2035 04/17/23 0743  GLUCAP 181* 128* 115* 133* 109*   D-Dimer No results for input(s): "DDIMER" in the last 72 hours. Hgb A1c No results for input(s): "HGBA1C" in the last 72 hours. Lipid Profile No results for input(s): "CHOL", "HDL", "LDLCALC", "TRIG", "CHOLHDL", "LDLDIRECT" in the last 72 hours. Thyroid function studies Recent Labs    04/16/23 0543  TSH 2.751   Anemia work up No results for input(s): "VITAMINB12", "FOLATE", "FERRITIN", "TIBC", "IRON", "RETICCTPCT" in the last 72 hours. Urinalysis    Component Value Date/Time   COLORURINE YELLOW 04/08/2023 2331   APPEARANCEUR CLEAR 04/08/2023 2331   LABSPEC 1.015 04/08/2023 2331   PHURINE 5.0 04/08/2023 2331   GLUCOSEU NEGATIVE 04/08/2023 2331   HGBUR NEGATIVE 04/08/2023 2331   BILIRUBINUR NEGATIVE 04/08/2023 2331   KETONESUR NEGATIVE 04/08/2023 2331   PROTEINUR 100 (A) 04/08/2023 2331   UROBILINOGEN 0.2 02/02/2014 1509   NITRITE NEGATIVE 04/08/2023 2331   LEUKOCYTESUR NEGATIVE 04/08/2023 2331   Sepsis Labs Recent Labs  Lab 04/11/23 0710 04/13/23 0953  WBC 13.6* 12.2*    Microbiology Recent Results (from the past 240 hours)  Blood Culture (routine x 2)     Status: None   Collection Time: 04/08/23 10:27 PM   Specimen: BLOOD RIGHT ARM  Result Value Ref Range Status   Specimen Description   Final    BLOOD RIGHT ARM Performed at [facility], [address], [place], [place] [address]    Special Requests   Final    BOTTLES DRAWN AEROBIC AND ANAEROBIC Blood Culture adequate volume Performed at [facility], [address], [place], [place] [address]    Culture   Final    NO GROWTH 5 DAYS Performed at [facility], [address], [place], [place] [address]    Report Status 04/14/2023 FINAL  Final  Resp panel by RT-PCR (RSV, Flu A&B, Covid) Anterior  Nasal Swab     Status: Abnormal   Collection Time: 04/08/23 10:50 PM   Specimen: Anterior Nasal Swab  Result Value Ref Range Status   SARS Coronavirus 2 by RT PCR NEGATIVE NEGATIVE Final    Comment: (NOTE) SARS-CoV-2 target nucleic acids are NOT DETECTED.  The SARS-CoV-2 RNA is generally detectable in upper respiratory specimens during the acute phase of infection. The lowest concentration of SARS-CoV-2 viral copies this assay can detect is 138 copies/mL. A negative result does not preclude SARS-Cov-2 infection and should not be used as the sole basis for treatment or other patient management decisions. A negative result may occur with  improper specimen collection/handling, submission of specimen other than nasopharyngeal swab, presence of viral mutation(s) within the areas targeted by this assay, and inadequate number of viral copies(<138 copies/mL). A negative result must be combined with clinical observations, patient history, and epidemiological information. The expected result is Negative.  Fact Sheet for Patients:  BloggerCourse.com  Fact Sheet for Healthcare Providers:  SeriousBroker.it  This  test is no t yet approved or cleared by the [place] FDA and  has been authorized for detection and/or diagnosis of SARS-CoV-2 by FDA under an Emergency Use Authorization (EUA). This EUA will remain  in effect (meaning this test can be used) for the duration of the COVID-19 declaration under Section 564(b)(1) of the Act, 21 U.S.C.section 360bbb-3(b)(1), unless the authorization is terminated  or revoked sooner.       Influenza A by PCR POSITIVE (A) NEGATIVE Final   Influenza B by PCR NEGATIVE NEGATIVE Final    Comment: (NOTE) The Xpert Xpress SARS-CoV-2/FLU/RSV plus assay is intended as an aid in the diagnosis of influenza from Nasopharyngeal swab specimens and should not be used as a sole basis for treatment. Nasal washings and aspirates are unacceptable for Xpert Xpress SARS-CoV-2/FLU/RSV testing.  Fact Sheet for Patients: BloggerCourse.com  Fact Sheet for Healthcare Providers: SeriousBroker.it  This test is not yet approved or cleared by the [place] FDA and has been authorized for detection and/or diagnosis of SARS-CoV-2 by FDA under an Emergency Use Authorization (EUA). This EUA will remain in effect (meaning this test can be used) for the duration of the COVID-19 declaration under Section 564(b)(1) of the Act, 21 U.S.C. section 360bbb-3(b)(1), unless the authorization is terminated or revoked.     Resp Syncytial Virus by PCR NEGATIVE NEGATIVE Final    Comment: (NOTE) Fact Sheet for Patients: BloggerCourse.com  Fact Sheet for Healthcare Providers: SeriousBroker.it  This test is not yet approved or cleared by the [place] FDA and has been authorized for detection and/or diagnosis of SARS-CoV-2 by FDA under an Emergency Use Authorization (EUA). This EUA will remain in effect (meaning this test can be used) for the duration of the COVID-19 declaration under  Section 564(b)(1) of the Act, 21 U.S.C. section 360bbb-3(b)(1), unless the authorization is terminated or revoked.  Performed at [facility], [address] [place], [place] [address]     FURTHER DISCHARGE INSTRUCTIONS:   Get Medicines reviewed and adjusted: Please take all your medications with you for your next visit with your Primary MD   Laboratory/radiological data: Please request your Primary MD to go over all hospital tests and procedure/radiological results at the follow up, please ask your Primary MD to get all Hospital records sent to his/her office.   In some cases, they will be blood work, cultures and biopsy results pending at the time of your discharge. Please request that your  primary care M.D. goes through all the records of your hospital data and follows up on these results.   Also Note the following: If you experience worsening of your admission symptoms, develop shortness of breath, life threatening emergency, suicidal or homicidal thoughts you must seek medical attention immediately by calling 911 or calling your MD immediately  if symptoms less severe.   You must read complete instructions/literature along with all the possible adverse reactions/side effects for all the Medicines you take and that have been prescribed to you. Take any new Medicines after you have completely understood and accpet all the possible adverse reactions/side effects.    Do not drive when taking Pain medications or sleeping medications (Benzodaizepines)   Do not take more than prescribed Pain, Sleep and Anxiety Medications. It is not advisable to combine anxiety,sleep and pain medications without talking with your primary care practitioner   Special Instructions: If you have smoked or chewed Tobacco  in the last 2 yrs please stop smoking, stop any regular Alcohol  and or any Recreational drug use.   Wear Seat belts while driving.   Please note: You were cared for by  a hospitalist during your hospital stay. Once you are discharged, your primary care physician will handle any further medical issues. Please note that NO REFILLS for any discharge medications will be authorized once you are discharged, as it is imperative that you return to your primary care physician (or establish a relationship with a primary care physician if you do not have one) for your post hospital discharge needs so that they can reassess your need for medications and monitor your lab values  Time coordinating discharge: Over 30 minutes  SIGNED:   Hughie Closs, MD  [facility] 04/17/2023, 10:57 AM *Please note that this is a verbal dictation therefore any spelling or grammatical errors are due to the "Dragon Medical One" system interpretation. If 7PM-7AM, please contact night-coverage [email or web address]

## 2023-04-17 NOTE — TOC Transition Note (Signed)
 Transition of Care [facility]) - Discharge Note   Patient Details  Name: [name] MRN: [id number] Date of Birth: [birth year]  Transition of Care [facility] CM/SW Contact:  Otelia Santee, LCSW Phone Number: 04/17/2023, 10:54 AM   Clinical Narrative:    Pt to return home with son and DIL. Pt will be receiving HHPT/OT through [facility]. HH orders in place. No DME needs identified. Family to transport home at discharge.    Final next level of care: Home w Home Health Services Barriers to Discharge: Barriers Resolved   Patient Goals and CMS Choice Patient states their goals for this hospitalization and ongoing recovery are:: For pt to return home CMS Medicare.gov Compare Post Acute Care list provided to:: Patient Choice offered to / list presented to : Patient [place] ownership interest in [facility].provided to::  (NA)    Discharge Placement                       Discharge Plan and Services Additional resources added to the After Visit Summary for   In-house Referral: Clinical Social Work Discharge Planning Services: NA Post Acute Care Choice: Home Health          DME Arranged: N/A DME Agency: NA       HH Arranged: PT, OT HH Agency: [facility] Date [facility] Agency Contacted: 04/10/23 Time HH Agency Contacted: 1446 Representative spoke with at [facility] Agency: Cindie  Social Drivers of Health (SDOH) Interventions SDOH Screenings   Food Insecurity: No Food Insecurity (04/09/2023)  Housing: Low Risk  (04/09/2023)  Transportation Needs: No Transportation Needs (04/09/2023)  Utilities: Not At Risk (04/09/2023)  Alcohol Screen: Low Risk  (07/22/2022)  Depression (PHQ2-9): Low Risk  (03/24/2023)  Financial Resource Strain: Low Risk  (07/22/2022)  Physical Activity: Sufficiently Active (07/22/2022)  Social Connections: Moderately Isolated (04/09/2023)  Stress: No Stress Concern Present (07/22/2022)  Tobacco Use: Low Risk  (04/08/2023)     Readmission Risk  Interventions    04/17/2023   10:53 AM 04/10/2023    1:41 PM  Readmission Risk Prevention Plan  Transportation Screening Complete Complete  PCP or Specialist Appt within 5-7 Days Complete Complete  Home Care Screening Complete Complete  Medication Review (RN CM) Complete Complete

## 2023-04-20 ENCOUNTER — Telehealth: Payer: Self-pay

## 2023-04-20 NOTE — Transitions of Care (Post Inpatient/ED Visit) (Signed)
   04/20/2023  Name: [name] MRN: [id number] DOB: [birth year]  Today's TOC FU Call Status: Today's TOC FU Call Status:: Unsuccessful Call (1st Attempt) Unsuccessful Call (1st Attempt) Date: 04/20/23  Attempted to reach the patient regarding the most recent Inpatient/ED visit.  Follow Up Plan: Additional outreach attempts will be made to reach the patient to complete the Transitions of Care (Post Inpatient/ED visit) call.   Deidre Ala, BSN, RN [place]  VBCI - [employer] [facility] RN Care Manager [phone]

## 2023-04-21 ENCOUNTER — Telehealth: Payer: Self-pay

## 2023-04-21 NOTE — Transitions of Care (Post Inpatient/ED Visit) (Signed)
   04/21/2023  Name: [name] MRN: [id number] DOB: 10-18-1934  Today's TOC FU Call Status: Today's TOC FU Call Status:: Unsuccessful Call (2nd Attempt) Unsuccessful Call (1st Attempt) Date: 04/20/23 Unsuccessful Call (2nd Attempt) Date: 04/21/23  Attempted to reach the patient regarding the most recent Inpatient/ED visit.  Follow Up Plan: Additional outreach attempts will be made to reach the patient to complete the Transitions of Care (Post Inpatient/ED visit) call.   Deidre Ala, BSN, RN [place]  [facility]BCI - [employer] [facility] RN Care Manager [phone]

## 2023-04-22 ENCOUNTER — Ambulatory Visit: Payer: Medicare Other | Admitting: Licensed Clinical Social Worker

## 2023-04-22 ENCOUNTER — Telehealth: Payer: Self-pay

## 2023-04-22 NOTE — Transitions of Care (Post Inpatient/ED Visit) (Signed)
   04/22/2023  Name: [name] MRN: [id number] DOB: [birth year]  Today's TOC FU Call Status: Today's TOC FU Call Status:: Unsuccessful Call (3rd Attempt) Unsuccessful Call (1st Attempt) Date: 04/20/23 Unsuccessful Call (2nd Attempt) Date: 04/21/23 Unsuccessful Call (3rd Attempt) Date: 04/22/23  Attempted to reach the patient regarding the most recent Inpatient/ED visit.  Follow Up Plan: No further outreach attempts will be made at this time. We have been unable to contact the patient.  Deidre Ala, BSN, RN [place]  [facility]BC[facility] - [employer] [facility] RN Care Manager [phone]

## 2023-04-23 ENCOUNTER — Encounter: Payer: Self-pay | Admitting: Neurology

## 2023-04-23 NOTE — Progress Notes (Deleted)
  Cardiology Office Note:  .   Date:  04/23/2023  ID:  [name], DOB Jun 05, 1934, MRN [id number] PCP: Joaquim Nam, MD  [facility] Providers Cardiologist:  Donato Schultz, MD {  History of Present Illness: .   [name] is a 89 y.o. male with a history of heart failure, insulin-dependent diabetes type 2, hypertension, hyperlipidemia, BPH, anxiety who presents with complaint of shortness of breath to the ED and cough.  Reported dyspnea on exertion and weakness also subjective fever at home.  EMS found him hypoxic in the range of 88% on room air.  On presentation had mild grade fever requiring 4 L of oxygen per minute via nasal cannula.  Lab work showed potassium 3.1 and creatinine 1.2 with elevated BNP of 618.  Influenza A found to be positive.  His chest x-ray showed a large cardiac silhouette and increased pulmonary vasculature with patchy bilateral airspace disease.  Patient admitted under hospital service care.  He was eventually discharged April 17, 2023.  Echocardiogram performed February 2022 showed LVEF 40 to 45% in echocardiogram this hospitalization with reduced LV systolic function with EF 35 to 40%, global hypokinesis, grade 1 DD.  Chest x-ray also showed pulmonary edema.  Cardiology was consulted at this time and patient was started on IV Lasix 40 mg twice a day, Aldactone and Jardiance.  Due to AKI all those medications were held but the Coreg was continued.  AKI resolved and we resumed losartan, Lasix and Jardiance.  Influenza A was treated and Tamiflu completed.  Hypertension was well-controlled.  He did have some nausea vomiting and diarrhea which all resolved.  AKI also resolved.  Today, he***  ROS: Pertinent ROS in HPI  Studies Reviewed: .        *** Risk Assessment/Calculations:   {Does this patient have ATRIAL FIBRILLATION?:[phone]} No BP recorded.  {Refresh Note OR Click here to enter BP  :1}***       Physical Exam:   VS:  There were no vitals  taken for this visit.   Wt Readings from Last 3 Encounters:  04/16/23 228 lb (103.4 kg)  04/06/23 225 lb 3.2 oz (102.2 kg)  03/24/23 225 lb (102.1 kg)    GEN: Well nourished, well developed in no acute distress NECK: No JVD; No carotid bruits CARDIAC: ***RRR, no murmurs, rubs, gallops RESPIRATORY:  Clear to auscultation without rales, wheezing or rhonchi  ABDOMEN: Soft, non-tender, non-distended EXTREMITIES:  No edema; No deformity   ASSESSMENT AND PLAN: .   Acute hypoxic respiratory failure secondary to acute on chronic combined systolic and diastolic CHF Influenza A Hypertension AKI CAD Type 2 diabetes mellitus Hypokalemia    {Are you ordering a CV Procedure (e.g. stress test, cath, DCCV, TEE, etc)?   Press F2        :[id number]}  Dispo: ***  Signed, Sharlene Dory, PA-C

## 2023-04-24 ENCOUNTER — Ambulatory Visit: Attending: Cardiology | Admitting: Physician Assistant

## 2023-04-24 DIAGNOSIS — N179 Acute kidney failure, unspecified: Secondary | ICD-10-CM

## 2023-04-24 DIAGNOSIS — J101 Influenza due to other identified influenza virus with other respiratory manifestations: Secondary | ICD-10-CM

## 2023-04-24 DIAGNOSIS — I5042 Chronic combined systolic (congestive) and diastolic (congestive) heart failure: Secondary | ICD-10-CM

## 2023-04-24 DIAGNOSIS — I2510 Atherosclerotic heart disease of native coronary artery without angina pectoris: Secondary | ICD-10-CM

## 2023-04-24 DIAGNOSIS — E876 Hypokalemia: Secondary | ICD-10-CM

## 2023-04-24 DIAGNOSIS — I10 Essential (primary) hypertension: Secondary | ICD-10-CM

## 2023-04-24 DIAGNOSIS — J9601 Acute respiratory failure with hypoxia: Secondary | ICD-10-CM

## 2023-04-24 DIAGNOSIS — E1159 Type 2 diabetes mellitus with other circulatory complications: Secondary | ICD-10-CM

## 2023-04-27 ENCOUNTER — Ambulatory Visit: Payer: Medicare Other | Admitting: Neurology

## 2023-04-29 ENCOUNTER — Other Ambulatory Visit: Payer: Self-pay | Admitting: Family Medicine

## 2023-04-29 DIAGNOSIS — R42 Dizziness and giddiness: Secondary | ICD-10-CM

## 2023-04-29 NOTE — Telephone Encounter (Unsigned)
 Copied from CRM [id number]. Topic: Clinical - Medication Refill >> Apr 29, 2023  3:03 PM Orinda Kenner C wrote: Most Recent Primary Care Visit:  Provider: Joaquim Nam  Department: [facility]  Visit Type: HOSPITAL FOLLOW UP  Date: 03/24/2023  Medication: meclizine (ANTIVERT) 12.5 MG tablet   Has the patient contacted their pharmacy? Yes (Agent: If no, request that the patient contact the pharmacy for the refill. If patient does not wish to contact the pharmacy document the reason why and proceed with request.) (Agent: If yes, when and what did the pharmacy advise?)  Is this the correct pharmacy for this prescription? Yes If no, delete pharmacy and type the correct one.  This is the patient's preferred pharmacy:  [facility] [place], [place] - [address] [place] [address] Phone: [phone] Fax: [phone]  Has the prescription been filled recently? No  Is the patient out of the medication? No, has 5 pills left. Patient takes 2 pills daily.   Has the patient been seen for an appointment in the last year OR does the patient have an upcoming appointment? Yes  Can we respond through MyChart? No, call patient's son Casimiro Needle [phone]  Agent: Please be advised that Rx refills may take up to 3 business days. We ask that you follow-up with your pharmacy.

## 2023-04-30 ENCOUNTER — Other Ambulatory Visit: Payer: Self-pay | Admitting: Family Medicine

## 2023-04-30 DIAGNOSIS — R42 Dizziness and giddiness: Secondary | ICD-10-CM

## 2023-05-01 MED ORDER — MECLIZINE HCL 12.5 MG PO TABS: 12.5000 mg | ORAL_TABLET | Freq: Three times a day (TID) | ORAL | 2 refills | Status: DC | PRN

## 2023-05-01 NOTE — Telephone Encounter (Signed)
 Sent. Thanks.

## 2023-05-14 ENCOUNTER — Other Ambulatory Visit: Payer: Self-pay | Admitting: Family Medicine

## 2023-05-22 ENCOUNTER — Other Ambulatory Visit: Payer: Self-pay | Admitting: Family Medicine

## 2023-06-08 ENCOUNTER — Inpatient Hospital Stay (HOSPITAL_COMMUNITY)
Admission: EM | Admit: 2023-06-08 | Discharge: 2023-06-11 | DRG: 291 | Disposition: A | Discharge status: Home / Self Care | Attending: Internal Medicine | Admitting: Internal Medicine

## 2023-06-08 ENCOUNTER — Other Ambulatory Visit: Payer: Self-pay

## 2023-06-08 ENCOUNTER — Emergency Department (HOSPITAL_COMMUNITY)

## 2023-06-08 DIAGNOSIS — M4802 Spinal stenosis, cervical region: Secondary | ICD-10-CM | POA: Diagnosis not present

## 2023-06-08 DIAGNOSIS — I110 Hypertensive heart disease with heart failure: Principal | ICD-10-CM | POA: Diagnosis present

## 2023-06-08 DIAGNOSIS — I509 Heart failure, unspecified: Secondary | ICD-10-CM | POA: Diagnosis not present

## 2023-06-08 DIAGNOSIS — J9601 Acute respiratory failure with hypoxia: Secondary | ICD-10-CM | POA: Diagnosis present

## 2023-06-08 DIAGNOSIS — R0689 Other abnormalities of breathing: Secondary | ICD-10-CM | POA: Diagnosis not present

## 2023-06-08 DIAGNOSIS — I2510 Atherosclerotic heart disease of native coronary artery without angina pectoris: Secondary | ICD-10-CM | POA: Diagnosis not present

## 2023-06-08 DIAGNOSIS — G8929 Other chronic pain: Secondary | ICD-10-CM | POA: Diagnosis present

## 2023-06-08 DIAGNOSIS — Z7984 Long term (current) use of oral hypoglycemic drugs: Secondary | ICD-10-CM | POA: Diagnosis not present

## 2023-06-08 DIAGNOSIS — F419 Anxiety disorder, unspecified: Secondary | ICD-10-CM | POA: Diagnosis present

## 2023-06-08 DIAGNOSIS — Z85820 Personal history of malignant melanoma of skin: Secondary | ICD-10-CM

## 2023-06-08 DIAGNOSIS — Z794 Long term (current) use of insulin: Secondary | ICD-10-CM

## 2023-06-08 DIAGNOSIS — E785 Hyperlipidemia, unspecified: Secondary | ICD-10-CM | POA: Diagnosis not present

## 2023-06-08 DIAGNOSIS — Z7982 Long term (current) use of aspirin: Secondary | ICD-10-CM | POA: Diagnosis not present

## 2023-06-08 DIAGNOSIS — M542 Cervicalgia: Secondary | ICD-10-CM | POA: Diagnosis not present

## 2023-06-08 DIAGNOSIS — N179 Acute kidney failure, unspecified: Secondary | ICD-10-CM | POA: Diagnosis not present

## 2023-06-08 DIAGNOSIS — Z8249 Family history of ischemic heart disease and other diseases of the circulatory system: Secondary | ICD-10-CM | POA: Diagnosis not present

## 2023-06-08 DIAGNOSIS — R0600 Dyspnea, unspecified: Secondary | ICD-10-CM | POA: Diagnosis not present

## 2023-06-08 DIAGNOSIS — M47812 Spondylosis without myelopathy or radiculopathy, cervical region: Secondary | ICD-10-CM | POA: Diagnosis not present

## 2023-06-08 DIAGNOSIS — R0602 Shortness of breath: Secondary | ICD-10-CM | POA: Diagnosis not present

## 2023-06-08 DIAGNOSIS — E876 Hypokalemia: Secondary | ICD-10-CM | POA: Diagnosis not present

## 2023-06-08 DIAGNOSIS — M25561 Pain in right knee: Secondary | ICD-10-CM | POA: Diagnosis present

## 2023-06-08 DIAGNOSIS — R6889 Other general symptoms and signs: Secondary | ICD-10-CM | POA: Diagnosis not present

## 2023-06-08 DIAGNOSIS — M25562 Pain in left knee: Secondary | ICD-10-CM | POA: Diagnosis present

## 2023-06-08 DIAGNOSIS — Z885 Allergy status to narcotic agent status: Secondary | ICD-10-CM | POA: Diagnosis not present

## 2023-06-08 DIAGNOSIS — E1169 Type 2 diabetes mellitus with other specified complication: Secondary | ICD-10-CM | POA: Diagnosis not present

## 2023-06-08 DIAGNOSIS — Z79899 Other long term (current) drug therapy: Secondary | ICD-10-CM | POA: Diagnosis not present

## 2023-06-08 DIAGNOSIS — N3000 Acute cystitis without hematuria: Secondary | ICD-10-CM | POA: Diagnosis not present

## 2023-06-08 DIAGNOSIS — I5043 Acute on chronic combined systolic (congestive) and diastolic (congestive) heart failure: Secondary | ICD-10-CM | POA: Diagnosis present

## 2023-06-08 DIAGNOSIS — I499 Cardiac arrhythmia, unspecified: Secondary | ICD-10-CM | POA: Diagnosis not present

## 2023-06-08 DIAGNOSIS — Z1152 Encounter for screening for COVID-19: Secondary | ICD-10-CM | POA: Diagnosis not present

## 2023-06-08 DIAGNOSIS — I152 Hypertension secondary to endocrine disorders: Secondary | ICD-10-CM | POA: Diagnosis present

## 2023-06-08 DIAGNOSIS — N138 Other obstructive and reflux uropathy: Secondary | ICD-10-CM | POA: Diagnosis present

## 2023-06-08 DIAGNOSIS — N3090 Cystitis, unspecified without hematuria: Secondary | ICD-10-CM

## 2023-06-08 DIAGNOSIS — R0989 Other specified symptoms and signs involving the circulatory and respiratory systems: Secondary | ICD-10-CM | POA: Diagnosis not present

## 2023-06-08 DIAGNOSIS — Z888 Allergy status to other drugs, medicaments and biological substances status: Secondary | ICD-10-CM

## 2023-06-08 DIAGNOSIS — N401 Enlarged prostate with lower urinary tract symptoms: Secondary | ICD-10-CM | POA: Diagnosis present

## 2023-06-08 DIAGNOSIS — E1159 Type 2 diabetes mellitus with other circulatory complications: Secondary | ICD-10-CM | POA: Diagnosis present

## 2023-06-08 LAB — BLOOD GAS, VENOUS
Acid-Base Excess: 4.7 mmol/L — ABNORMAL HIGH (ref 0.0–2.0)
Bicarbonate: 28.2 mmol/L — ABNORMAL HIGH (ref 20.0–28.0)
O2 Saturation: 89.3 %
Patient temperature: 37
pCO2, Ven: 37 mmHg — ABNORMAL LOW (ref 44–60)
pH, Ven: 7.49 — ABNORMAL HIGH (ref 7.25–7.43)
pO2, Ven: 53 mmHg — ABNORMAL HIGH (ref 32–45)

## 2023-06-08 LAB — I-STAT CHEM 8, ED
BUN: 13 mg/dL (ref 8–23)
Calcium, Ion: 1.21 mmol/L (ref 1.15–1.40)
Chloride: 102 mmol/L (ref 98–111)
Creatinine, Ser: 1.2 mg/dL (ref 0.61–1.24)
Glucose, Bld: 153 mg/dL — ABNORMAL HIGH (ref 70–99)
HCT: 40 % (ref 39.0–52.0)
Hemoglobin: 13.6 g/dL (ref 13.0–17.0)
Potassium: 3.3 mmol/L — ABNORMAL LOW (ref 3.5–5.1)
Sodium: 138 mmol/L (ref 135–145)
TCO2: 24 mmol/L (ref 22–32)

## 2023-06-08 LAB — CBC WITH DIFFERENTIAL/PLATELET
Abs Immature Granulocytes: 0.02 10*3/uL (ref 0.00–0.07)
Basophils Absolute: 0 10*3/uL (ref 0.0–0.1)
Basophils Relative: 0 %
Eosinophils Absolute: 0.1 10*3/uL (ref 0.0–0.5)
Eosinophils Relative: 1 %
HCT: 39.9 % (ref 39.0–52.0)
Hemoglobin: 13.2 g/dL (ref 13.0–17.0)
Immature Granulocytes: 0 %
Lymphocytes Relative: 17 %
Lymphs Abs: 1.6 10*3/uL (ref 0.7–4.0)
MCH: 30.1 pg (ref 26.0–34.0)
MCHC: 33.1 g/dL (ref 30.0–36.0)
MCV: 90.9 fL (ref 80.0–100.0)
Monocytes Absolute: 0.7 10*3/uL (ref 0.1–1.0)
Monocytes Relative: 8 %
Neutro Abs: 7 10*3/uL (ref 1.7–7.7)
Neutrophils Relative %: 74 %
Platelets: 268 10*3/uL (ref 150–400)
RBC: 4.39 MIL/uL (ref 4.22–5.81)
RDW: 15.7 % — ABNORMAL HIGH (ref 11.5–15.5)
WBC: 9.5 10*3/uL (ref 4.0–10.5)
nRBC: 0 % (ref 0.0–0.2)

## 2023-06-08 LAB — COMPREHENSIVE METABOLIC PANEL WITH GFR
ALT: 13 U/L (ref 0–44)
AST: 13 U/L — ABNORMAL LOW (ref 15–41)
Albumin: 3.6 g/dL (ref 3.5–5.0)
Alkaline Phosphatase: 61 U/L (ref 38–126)
Anion gap: 9 (ref 5–15)
BUN: 15 mg/dL (ref 8–23)
CO2: 24 mmol/L (ref 22–32)
Calcium: 9.4 mg/dL (ref 8.9–10.3)
Chloride: 103 mmol/L (ref 98–111)
Creatinine, Ser: 1.15 mg/dL (ref 0.61–1.24)
GFR, Estimated: >60 mL/min (ref >60)
Glucose, Bld: 155 mg/dL — ABNORMAL HIGH (ref 70–99)
Potassium: 3.3 mmol/L — ABNORMAL LOW (ref 3.5–5.1)
Sodium: 136 mmol/L (ref 135–145)
Total Bilirubin: 0.8 mg/dL (ref 0.0–1.2)
Total Protein: 6.5 g/dL (ref 6.5–8.1)

## 2023-06-08 LAB — URINALYSIS, ROUTINE W REFLEX MICROSCOPIC
Bilirubin Urine: NEGATIVE
Glucose, UA: NEGATIVE mg/dL
Ketones, ur: NEGATIVE mg/dL
Nitrite: NEGATIVE
Protein, ur: 100 mg/dL — AB
Specific Gravity, Urine: 1.009 (ref 1.005–1.030)
pH: 8 (ref 5.0–8.0)

## 2023-06-08 LAB — BRAIN NATRIURETIC PEPTIDE: B Natriuretic Peptide: 872.4 pg/mL — ABNORMAL HIGH (ref 0.0–100.0)

## 2023-06-08 LAB — D-DIMER, QUANTITATIVE: D-Dimer, Quant: 0.47 ug{FEU}/mL (ref 0.00–0.50)

## 2023-06-08 LAB — TROPONIN I (HIGH SENSITIVITY)
Troponin I (High Sensitivity): 31 ng/L — ABNORMAL HIGH (ref <18)
Troponin I (High Sensitivity): 33 ng/L — ABNORMAL HIGH (ref <18)

## 2023-06-08 LAB — RESP PANEL BY RT-PCR (RSV, FLU A&B, COVID)  RVPGX2
Influenza A by PCR: NEGATIVE
Influenza B by PCR: NEGATIVE
Resp Syncytial Virus by PCR: NEGATIVE
SARS Coronavirus 2 by RT PCR: NEGATIVE

## 2023-06-08 LAB — CBG MONITORING, ED: Glucose-Capillary: 270 mg/dL — ABNORMAL HIGH (ref 70–99)

## 2023-06-08 LAB — MAGNESIUM: Magnesium: 1.8 mg/dL (ref 1.7–2.4)

## 2023-06-08 LAB — I-STAT CG4 LACTIC ACID, ED: Lactic Acid, Venous: 1.5 mmol/L (ref 0.5–1.9)

## 2023-06-08 MED ORDER — HYDRALAZINE HCL 20 MG/ML IJ SOLN
10.0000 mg | INTRAMUSCULAR | Status: DC | PRN
  Filled 2023-06-08: qty 1

## 2023-06-08 MED ORDER — INSULIN ASPART 100 UNIT/ML IJ SOLN
0.0000 [IU] | Freq: Three times a day (TID) | INTRAMUSCULAR | Status: DC
Start: 2023-06-08 — End: 2023-06-11
  Administered 2023-06-08: 5 [IU] via SUBCUTANEOUS
  Administered 2023-06-09: 2 [IU] via SUBCUTANEOUS
  Administered 2023-06-09: 3 [IU] via SUBCUTANEOUS
  Administered 2023-06-09 – 2023-06-10 (×2): 2 [IU] via SUBCUTANEOUS
  Administered 2023-06-10: 3 [IU] via SUBCUTANEOUS
  Administered 2023-06-10: 2 [IU] via SUBCUTANEOUS
  Administered 2023-06-11 (×2): 3 [IU] via SUBCUTANEOUS
  Filled 2023-06-08: qty 0.09

## 2023-06-08 MED ORDER — CARVEDILOL 12.5 MG PO TABS
12.5000 mg | ORAL_TABLET | Freq: Two times a day (BID) | ORAL | Status: DC
  Administered 2023-06-09 – 2023-06-11 (×5): 12.5 mg via ORAL
  Filled 2023-06-08 (×5): qty 1

## 2023-06-08 MED ORDER — HYDRALAZINE HCL 50 MG PO TABS
50.0000 mg | ORAL_TABLET | Freq: Three times a day (TID) | ORAL | Status: DC
Start: 2023-06-08 — End: 2023-06-11
  Administered 2023-06-08 – 2023-06-11 (×8): 50 mg via ORAL
  Filled 2023-06-08: qty 2
  Filled 2023-06-08 (×3): qty 1
  Filled 2023-06-08: qty 2
  Filled 2023-06-08 (×3): qty 1

## 2023-06-08 MED ORDER — ESCITALOPRAM OXALATE 20 MG PO TABS
20.0000 mg | ORAL_TABLET | Freq: Every day | ORAL | Status: DC
  Administered 2023-06-09 – 2023-06-11 (×3): 20 mg via ORAL
  Filled 2023-06-08: qty 1
  Filled 2023-06-08: qty 2
  Filled 2023-06-08: qty 1

## 2023-06-08 MED ORDER — LOSARTAN POTASSIUM 25 MG PO TABS
25.0000 mg | ORAL_TABLET | Freq: Every day | ORAL | Status: DC
  Administered 2023-06-09 – 2023-06-11 (×3): 25 mg via ORAL
  Filled 2023-06-08 (×3): qty 1

## 2023-06-08 MED ORDER — ONDANSETRON HCL 4 MG PO TABS
4.0000 mg | ORAL_TABLET | Freq: Four times a day (QID) | ORAL | Status: DC | PRN
Start: 2023-06-08 — End: 2023-06-11

## 2023-06-08 MED ORDER — TRAMADOL HCL 50 MG PO TABS
50.0000 mg | ORAL_TABLET | Freq: Two times a day (BID) | ORAL | Status: DC | PRN
  Administered 2023-06-09 (×2): 50 mg via ORAL
  Filled 2023-06-08 (×2): qty 1

## 2023-06-08 MED ORDER — OXYCODONE-ACETAMINOPHEN 5-325 MG PO TABS
1.0000 | ORAL_TABLET | Freq: Once | ORAL | Status: AC
  Administered 2023-06-08: 1 via ORAL
  Filled 2023-06-08: qty 1

## 2023-06-08 MED ORDER — POTASSIUM CHLORIDE CRYS ER 20 MEQ PO TBCR
40.0000 meq | EXTENDED_RELEASE_TABLET | Freq: Once | ORAL | Status: AC
Start: 2023-06-08 — End: 2023-06-08
  Administered 2023-06-08: 40 meq via ORAL
  Filled 2023-06-08: qty 2

## 2023-06-08 MED ORDER — ONDANSETRON HCL 4 MG/2ML IJ SOLN
4.0000 mg | Freq: Four times a day (QID) | INTRAMUSCULAR | Status: DC | PRN
Start: 2023-06-08 — End: 2023-06-11

## 2023-06-08 MED ORDER — SODIUM CHLORIDE 0.9 % IV SOLN
1.0000 g | Freq: Once | INTRAVENOUS | Status: AC
  Administered 2023-06-08: 1 g via INTRAVENOUS
  Filled 2023-06-08: qty 10

## 2023-06-08 MED ORDER — INSULIN GLARGINE-YFGN 100 UNIT/ML ~~LOC~~ SOLN
10.0000 [IU] | Freq: Every day | SUBCUTANEOUS | Status: DC
  Administered 2023-06-09 – 2023-06-10 (×3): 10 [IU] via SUBCUTANEOUS
  Filled 2023-06-08 (×4): qty 0.1

## 2023-06-08 MED ORDER — SODIUM CHLORIDE 0.9% FLUSH
3.0000 mL | Freq: Two times a day (BID) | INTRAVENOUS | Status: DC
  Administered 2023-06-09 – 2023-06-11 (×6): 3 mL via INTRAVENOUS

## 2023-06-08 MED ORDER — EMPAGLIFLOZIN 10 MG PO TABS
10.0000 mg | ORAL_TABLET | Freq: Every day | ORAL | Status: DC
  Administered 2023-06-09 – 2023-06-11 (×3): 10 mg via ORAL
  Filled 2023-06-08 (×3): qty 1

## 2023-06-08 MED ORDER — TAMSULOSIN HCL 0.4 MG PO CAPS
0.4000 mg | ORAL_CAPSULE | Freq: Every day | ORAL | Status: DC
  Administered 2023-06-08 – 2023-06-10 (×3): 0.4 mg via ORAL
  Filled 2023-06-08 (×3): qty 1

## 2023-06-08 MED ORDER — ENOXAPARIN SODIUM 40 MG/0.4ML IJ SOSY
40.0000 mg | PREFILLED_SYRINGE | INTRAMUSCULAR | Status: DC
  Administered 2023-06-08 – 2023-06-10 (×3): 40 mg via SUBCUTANEOUS
  Filled 2023-06-08 (×3): qty 0.4

## 2023-06-08 MED ORDER — FUROSEMIDE 10 MG/ML IJ SOLN
40.0000 mg | Freq: Two times a day (BID) | INTRAMUSCULAR | Status: DC
  Administered 2023-06-08 – 2023-06-10 (×4): 40 mg via INTRAVENOUS
  Filled 2023-06-08 (×4): qty 4

## 2023-06-08 MED ORDER — ACETAMINOPHEN 650 MG RE SUPP: 650.0000 mg | Freq: Four times a day (QID) | RECTAL | Status: DC | PRN

## 2023-06-08 MED ORDER — POTASSIUM CHLORIDE CRYS ER 10 MEQ PO TBCR
10.0000 meq | EXTENDED_RELEASE_TABLET | Freq: Every day | ORAL | Status: DC
  Administered 2023-06-08 – 2023-06-10 (×3): 10 meq via ORAL
  Filled 2023-06-08 (×3): qty 1

## 2023-06-08 MED ORDER — IPRATROPIUM-ALBUTEROL 0.5-2.5 (3) MG/3ML IN SOLN
3.0000 mL | Freq: Once | RESPIRATORY_TRACT | Status: AC
  Administered 2023-06-08: 3 mL via RESPIRATORY_TRACT
  Filled 2023-06-08: qty 3

## 2023-06-08 MED ORDER — FUROSEMIDE 10 MG/ML IJ SOLN
40.0000 mg | Freq: Once | INTRAMUSCULAR | Status: AC
  Administered 2023-06-08: 40 mg via INTRAVENOUS
  Filled 2023-06-08: qty 4

## 2023-06-08 MED ORDER — ACETAMINOPHEN 325 MG PO TABS
650.0000 mg | ORAL_TABLET | Freq: Four times a day (QID) | ORAL | Status: DC | PRN
  Administered 2023-06-09 – 2023-06-11 (×4): 650 mg via ORAL
  Filled 2023-06-08 (×4): qty 2

## 2023-06-08 MED ORDER — SENNOSIDES-DOCUSATE SODIUM 8.6-50 MG PO TABS: 1.0000 | ORAL_TABLET | Freq: Every evening | ORAL | Status: DC | PRN

## 2023-06-08 MED ORDER — BISACODYL 5 MG PO TBEC: 5.0000 mg | DELAYED_RELEASE_TABLET | Freq: Every day | ORAL | Status: DC | PRN

## 2023-06-08 NOTE — ED Provider Notes (Signed)
 Received signout; see morning team's note for full HPI.  Family called out due to tremoring and weakness which is not necessarily a new problem but seemingly worse this morning.  Was hypoxic, no oxygen requirements currently requiring 2 L nasal cannula does not appear to be obvious distress.  CHF type picture, given Lasix .  Possible UTI given Rocephin .  CT scan of the neck unchanged.  Will admit for acute hypoxic respiratory failure secondary to CHF.   Rolinda Climes, DO 06/08/23 1807

## 2023-06-08 NOTE — ED Notes (Signed)
Called Lab to add urine culture  

## 2023-06-08 NOTE — Hospital Course (Signed)
 [name] is a 89 y.o. male with medical history significant for chronic combined systolic and diastolic CHF, insulin -dependent T2DM, HTN, HLD, BPH, anxiety who is admitted with acute on chronic combined systolic and diastolic CHF.

## 2023-06-08 NOTE — ED Provider Notes (Signed)
 [place] EMERGENCY DEPARTMENT AT [facility] Provider Note   CSN: [id number] Arrival date & time: 06/08/23  1328     History  Chief Complaint  Patient presents with   Tremors    [name] is a 89 y.o. male.  HPI Patient via EMS for concern of sepsis.  His medical history includes anxiety, CAD, HTN, BPH, HLD, DM, CHF.  He is not on a blood thinner.  Patient reports that his family called 911 due to tremoring that he gets in his legs.  He states that this is chronic and occurs intermittently.  Family also stated that he has had urinary frequency.  When EMS arrived on scene, fire department had placed patient on supplemental oxygen by nasal cannula.  His SpO2 was 94% on 4 L of supplemental oxygen.  Patient denies baseline use of supplemental oxygen.  Patient states that he has acute on chronic pain in bilateral knees and his neck.  He denies any areas of new discomfort.  In addition to hypoxia, EMS also noted mild tachycardia and tactile fever.    Home Medications Prior to Admission medications   Medication Sig Start Date End Date Taking? Authorizing Provider  Accu-Chek Softclix Lancets lancets TEST BLOOD SUGAR UP TO THREE TIMES DAILY 01/23/20   Donnie Galea, MD  aspirin  EC 81 MG tablet Take 81 mg by mouth every evening.    [provider]  Blood Glucose Calibration (ACCU-CHEK AVIVA) SOLN Use as directed monthly. E11.59  Insulin  dependent 12/06/18   Donnie Galea, MD  Blood Glucose Monitoring Suppl (ACCU-CHEK AVIVA PLUS) w/Device KIT Check sugar up to 3 times daily. E11.59  Insulin  dependent 11/30/18   Donnie Galea, MD  carvedilol  (COREG ) 12.5 MG tablet TAKE 1 TABLET BY MOUTH TWICE  DAILY WITH MEALS 03/10/23   Donnie Galea, MD  COD LIVER OIL PO Take 1 capsule by mouth 2 (two) times daily.     [provider]  Continuous Glucose Receiver (FREESTYLE LIBRE 3 READER) DEVI 1 each by Does not apply route as directed. 05/28/22   Donnie Galea,  MD  Continuous Glucose Sensor (FREESTYLE LIBRE 3 SENSOR) MISC Place 1 sensor on the skin every 14 days. Use to check glucose continuously 05/28/22   Donnie Galea, MD  cyanocobalamin  (,VITAMIN B-12,) 1000 MCG/ML injection Inject 1 mL (1,000 mcg total) into the muscle every 30 (thirty) days. Dispense 3 of the 1 mL vials. 10/29/18   Donnie Galea, MD  DROPLET PEN NEEDLES 30G X 8 MM MISC USE  WITH  INSULIN   PEN 02/03/19   Donnie Galea, MD  escitalopram  (LEXAPRO ) 20 MG tablet Take 1 tablet (20 mg total) by mouth daily. 03/25/23   Donnie Galea, MD  furosemide  (LASIX ) 20 MG tablet Take 1 tablet (20 mg total) by mouth daily. 04/17/23 04/16/24  Modena Andes, MD  hydrALAZINE  (APRESOLINE ) 50 MG tablet TAKE 1 TABLET BY MOUTH 3 TIMES  DAILY 03/10/23   Donnie Galea, MD  insulin  glargine, 1 Unit Dial , ([name]  SOLOSTAR) 300 UNIT/ML Solostar Pen Inject 20 Units into the skin at bedtime. INJECT SUBCUTANEOUSLY 16  UNITS AT BEDTIME 04/17/23   Modena Andes, MD  insulin  lispro (HUMALOG  KWIKPEN) 100 UNIT/ML KwikPen INJECT SUBCUTANEOUSLY 4-10 UNITS 3 TIMES DAILY BEFORE MEALS PER  SLIDING SCALE: 151-200 = 4 UN,  201-250 = 6 UN, 251-300 = 8 UN,  AND OVER 300 = 10 UN 01/02/23   Donnie Galea, MD  losartan  (  COZAAR ) 25 MG tablet Take 1 tablet (25 mg total) by mouth daily. 04/17/23 04/16/24  Modena Andes, MD  meclizine  (ANTIVERT ) 12.5 MG tablet Take 1 tablet (12.5 mg total) by mouth 3 (three) times daily as needed for dizziness. 05/01/23   Donnie Galea, MD  naproxen  sodium (ALEVE ) 220 MG tablet Take 220 mg by mouth daily as needed.    [provider]  ondansetron  (ZOFRAN ) 4 MG tablet Take 1 tablet (4 mg total) by mouth every 6 (six) hours. 03/06/23   Kingsley, Victoria K, DO  ONETOUCH ULTRA test strip USE TO CHECK UP TO 3 TIMES DAILY AS NEEDED 05/14/23   Donnie Galea, MD  SYRINGE-NEEDLE, DISP, 3 ML (B-D 3CC LUER-LOK SYR 25GX1") 25G X 1" 3 ML MISC USE TO INJECT VITAMIN B12 MONTHLY. 08/22/19   Donnie Galea,  MD  tamsulosin  (FLOMAX ) 0.4 MG CAPS capsule TAKE 1 CAPSULE BY MOUTH DAILY  AFTER SUPPER 12/05/22   Donnie Galea, MD  traMADol  (ULTRAM ) 50 MG tablet TAKE 1 TABLET BY MOUTH EVERY 12 HOURS AS NEEDED FOR KNEE PAIN. 05/24/23   Donnie Galea, MD      Allergies    Morphine , Atorvastatin , Codeine phosphate, and Metformin  and related    Review of Systems   Review of Systems  Respiratory:  Positive for shortness of breath.   Genitourinary:  Positive for frequency.  Musculoskeletal:  Positive for arthralgias and neck pain.  Neurological:  Positive for tremors.  All other systems reviewed and are negative.   Physical Exam Updated Vital Signs BP (!) 131/99 (BP Location: Left Arm)   Pulse 85   Temp 99.1 F (37.3 C) (Rectal)   Resp (!) 24 Comment: previous value filed in error  Ht 6' (1.829 m)   Wt 99.8 kg   SpO2 93%   BMI 29.84 kg/m  Physical Exam Vitals and nursing note reviewed.  Constitutional:      General: He is not in acute distress.    Appearance: Normal appearance. He is well-developed. He is not toxic-appearing or diaphoretic.  HENT:     Head: Normocephalic and atraumatic.     Right Ear: External ear normal.     Left Ear: External ear normal.     Nose: Nose normal.     Mouth/Throat:     Mouth: Mucous membranes are moist.  Eyes:     Extraocular Movements: Extraocular movements intact.     Conjunctiva/sclera: Conjunctivae normal.  Cardiovascular:     Rate and Rhythm: Normal rate and regular rhythm.     Heart sounds: No murmur heard. Pulmonary:     Effort: Pulmonary effort is normal. Tachypnea present. No respiratory distress.     Breath sounds: Examination of the right-lower field reveals decreased breath sounds. Examination of the left-lower field reveals decreased breath sounds. Decreased breath sounds and wheezing present.  Abdominal:     General: There is no distension.     Palpations: Abdomen is soft.     Tenderness: There is no abdominal tenderness.   Musculoskeletal:        General: No swelling, tenderness or deformity.     Cervical back: Normal range of motion and neck supple.  Skin:    General: Skin is warm and dry.     Coloration: Skin is not jaundiced or pale.  Neurological:     General: No focal deficit present.     Mental Status: He is alert and oriented to person, place, and time.  Psychiatric:  Mood and Affect: Mood normal.        Behavior: Behavior normal.     ED Results / Procedures / Treatments   Labs (all labs ordered are listed, but only abnormal results are displayed) Labs Reviewed  COMPREHENSIVE METABOLIC PANEL WITH GFR - Abnormal; Notable for the following components:      Result Value   Potassium 3.3 (*)    Glucose, Bld 155 (*)    AST 13 (*)    All other components within normal limits  BRAIN NATRIURETIC PEPTIDE - Abnormal; Notable for the following components:   B Natriuretic Peptide 872.4 (*)    All other components within normal limits  BLOOD GAS, VENOUS - Abnormal; Notable for the following components:   pH, Ven 7.49 (*)    pCO2, Ven 37 (*)    pO2, Ven 53 (*)    Bicarbonate 28.2 (*)    Acid-Base Excess 4.7 (*)    All other components within normal limits  CBC WITH DIFFERENTIAL/PLATELET - Abnormal; Notable for the following components:   RDW 15.7 (*)    All other components within normal limits  URINALYSIS, ROUTINE W REFLEX MICROSCOPIC - Abnormal; Notable for the following components:   Color, Urine STRAW (*)    Hgb urine dipstick SMALL (*)    Protein, ur 100 (*)    Leukocytes,Ua SMALL (*)    Bacteria, UA FEW (*)    All other components within normal limits  I-STAT CHEM 8, ED - Abnormal; Notable for the following components:   Potassium 3.3 (*)    Glucose, Bld 153 (*)    All other components within normal limits  TROPONIN I (HIGH SENSITIVITY) - Abnormal; Notable for the following components:   Troponin I (High Sensitivity) 31 (*)    All other components within normal limits  RESP  PANEL BY RT-PCR (RSV, FLU A&B, COVID)  RVPGX2  CULTURE, BLOOD (ROUTINE X 2)  CULTURE, BLOOD (ROUTINE X 2)  URINE CULTURE  D-DIMER, QUANTITATIVE  MAGNESIUM   I-STAT CG4 LACTIC ACID, ED  TROPONIN I (HIGH SENSITIVITY)    EKG EKG Interpretation Date/Time:  Monday June 08 2023 13:47:10 EDT Ventricular Rate:  89 PR Interval:  194 QRS Duration:  106 QT Interval:  351 QTC Calculation: 427 R Axis:   44  Text Interpretation: Sinus rhythm Premature atrial complexes Borderline repolarization abnormality Confirmed by Iva Mariner ([id number]) on 06/08/2023 2:58:54 PM  Radiology No results found.  Procedures Procedures    Medications Ordered in ED Medications  cefTRIAXone  (ROCEPHIN ) 1 g in sodium chloride  0.9 % 100 mL IVPB (has no administration in time range)  potassium chloride  SA (KLOR-CON  M) CR tablet 40 mEq (has no administration in time range)  furosemide  (LASIX ) injection 40 mg (has no administration in time range)  ipratropium-albuterol  (DUONEB) 0.5-2.5 (3) MG/3ML nebulizer solution 3 mL (3 mLs Nebulization Given 06/08/23 1458)  oxyCODONE -acetaminophen  (PERCOCET/ROXICET) 5-325 MG per tablet 1 tablet (1 tablet Oral Given 06/08/23 1455)    ED Course/ Medical Decision Making/ A&P                                 Medical Decision Making Amount and/or Complexity of Data Reviewed Labs: ordered. Radiology: ordered.  Risk Prescription drug management.   This patient presents to the ED for concern of multiple concerns, this involves an extensive number of treatment options, and is a complaint that carries with it a high risk of complications and  morbidity.  The differential diagnosis includes pneumonia, PE, reactive airway disease, CHF, sepsis, anemia, acidosis   Co morbidities that complicate the patient evaluation  anxiety, CAD, HTN, BPH, HLD, DM, CHF   Additional history obtained:  Additional history obtained from EMS External records from outside source obtained and reviewed  including EMR   Lab Tests:  I Ordered, and personally interpreted labs.  The pertinent results include: Hypokalemia is present with otherwise normal electrolytes; BNP is elevated consistent with CHF exacerbation; urinalysis shows evidence of UTI   Imaging Studies ordered:  I ordered imaging studies including chest x-ray, CT of cervical spine I independently visualized and interpreted imaging which showed (pending at time of signout) I agree with the radiologist interpretation   Cardiac Monitoring: / EKG:  The patient was maintained on a cardiac monitor.  I personally viewed and interpreted the cardiac monitored which showed an underlying rhythm of: Sinus rhythm   Problem List / ED Course / Critical interventions / Medication management  Patient presenting, reportedly initially for tremoring in his legs.  EMS then noted concern of sepsis.  On arrival, patient's main complaint is worsened chronic pain in bilateral knees and neck.  He has been to be tachypneic with mildly increased work of breathing.  He is able to speak in complete sentences.  SpO2 is in the mid 90s on 4 L of supplemental oxygen.  This does appear to be a new hypoxia.  Rectal temperature was 99.3 degrees.  Patient was placed on monitor.  Workup was initiated.  Patient's lab work and x-ray are consistent with CHF exacerbation.  He was given potassium replacement and a dose of IV Lasix  was ordered.  His urinalysis does show evidence of infection.  Given his recent frequency, patient to be treated empirically for UTI.  Ceftriaxone  was ordered.  Will send for urine cultures.  Imaging studies pending at time of signout.  Care of patient was signed out to oncoming ED provider. I ordered medication including Percocet for analgesia; DuoNeb for shortness of breath; ceftriaxone  for UTI; potassium chloride  for hypokalemia; Lasix  for diuresis Reevaluation of the patient after these medicines showed that the patient improved I have  reviewed the patients home medicines and have made adjustments as needed   Social Determinants of Health:  Lives at home with family  CRITICAL CARE Performed by: Iva Mariner   Total critical care time: 32 minutes  Critical care time was exclusive of separately billable procedures and treating other patients.  Critical care was necessary to treat or prevent imminent or life-threatening deterioration.  Critical care was time spent personally by me on the following activities: development of treatment plan with patient and/or surrogate as well as nursing, discussions with consultants, evaluation of patient's response to treatment, examination of patient, obtaining history from patient or surrogate, ordering and performing treatments and interventions, ordering and review of laboratory studies, ordering and review of radiographic studies, pulse oximetry and re-evaluation of patient's condition.        Final Clinical Impression(s) / ED Diagnoses Final diagnoses:  Acute on chronic congestive heart failure, unspecified heart failure type (HCC)  Acute respiratory failure with hypoxia (HCC)  Cystitis    Rx / DC Orders ED Discharge Orders     None         Iva Mariner, MD 06/08/23 1600

## 2023-06-08 NOTE — ED Triage Notes (Signed)
 Pt BIB [facility] coming from home. Pt presents with chills, increased urinary frequency, but he wasn't able to say how long he has been having his symptoms. EMS report pt feels warm to the touch and is hypertensive. EMS started IV with LR infusion. EMS state the pt is A/Ox4 but is a poor historian.  EMS Vitals  200/p HR 80-105  SpO2 92% on 4L via [place] (EMS does not know if pt is on O2 at baseline) RR 24 CBG 139

## 2023-06-08 NOTE — H&P (Signed)
 History and Physical    [name]:[id number] DOB: 02-05-1935 DOA: 06/08/2023  PCP: [name] Galea, MD  Patient coming from: Home  I have personally briefly reviewed patient's old medical records in [facility]  Chief Complaint: Hypoxia  HPI: [name] is a 89 y.o. male with medical history significant for chronic combined systolic and diastolic CHF, insulin -dependent T2DM, HTN, HLD, BPH, anxiety who presented to the ED for evaluation of hypoxia.  Patient has chronic issues of intermittent tremoring of lower extremities.  This occurred today and EMS were called to the home.  Per patient's son, his SpO2 was 80% on their evaluation and he was placed on 4 L O2 via [place] and brought to the ED for further evaluation.  Patient himself denies having much shortness of breath.  He does report cough occasionally productive of white sputum but feels that this is chronic.  He denies chest pain, fevers, chills, diaphoresis.  He has had increased urine output with use of Lasix  but denies dysuria.  He denies increased swelling to his lower extremities.  He was recently admitted 2/26-3/7 for acute hypoxic respiratory failure due to acute on chronic CHF and influenza A.  TTE on 2/27 showed reduced EF 35-40% compared to previous 40-45% in 2022.  ED Course  Labs/Imaging on admission: I have personally reviewed following labs and imaging studies.  Initial vitals showed BP 131/99, pulse 86, RR 17, temp 97.1 F, SpO2 94% on 2 L [place].  Labs showed WBC 9.5, hemoglobin 13.2, platelets 268, sodium 136, potassium 3.3, bicarb 24, BUN 15, creatinine 1.15, serum glucose 155, BNP 872.4, troponin 31 > 33, magnesium  1.8, D-dimer 0.47, lactic acid 1.5.  SARS-CoV-2, influenza, RSV PCR negative.  UA showed negative nitrites, small leukocytes, 11-20 RBCs, 21-50 WBCs, few bacteria.  Urine culture in process.  Blood cultures in process.  Formal chest x-ray negative for focal consolidation, edema,  effusion.  CT cervical spine without contrast negative for acute cervical spine fracture.  Severe multilevel cervical spondylosis and facet hypertrophy again noted without significant change compared to prior study.  Patient was given IV Lasix  40 mg, IV ceftriaxone , DuoNeb, oral K 40 mEq.  The hospitalist service was consulted to admit.  Review of Systems: All systems reviewed and are negative except as documented in history of present illness above.   Past Medical History:  Diagnosis Date   Anxiety    Borderline hyperlipidemia    CAD (coronary artery disease)    Colon polyps    Complication of anesthesia    trouble urinating after anesthesia   Diverticulosis of colon    DJD (degenerative joint disease)    DM (diabetes mellitus) (HCC)    Adult onset  type 2   History of gastritis    History of kidney stones    surgery to remove   History of pyelonephritis    History of syncope    HTN (hypertension)    Hypertrophy of prostate with urinary obstruction and other lower urinary tract symptoms (LUTS)    Melanoma (HCC)    back of neck   Melanoma (HCC) 2022   per dermatology   Myoclonus    Pancreatitis due to biliary obstruction several years ago   treated by Dr. Joshua Nieves    Past Surgical History:  Procedure Laterality Date   APPENDECTOMY     cartarized  nose blood vessel     left side   CATARACT EXTRACTION     RIGHT EYE  CHOLECYSTECTOMY     cystoscopy  04/17/11   stent placed   CYSTOSCOPY W/ URETERAL STENT PLACEMENT  04/17/2011   Procedure: CYSTOSCOPY WITH RETROGRADE PYELOGRAM/URETERAL STENT PLACEMENT;  Surgeon: Jinny Mounts, MD;  Location: [facility];  Service: Urology;  Laterality: Left;   CYSTOSCOPY WITH RETROGRADE PYELOGRAM, URETEROSCOPY AND STENT PLACEMENT Left 04/18/2013   Procedure: CYSTOSCOPY WITH LEFT RETROGRADE PYELOGRAM, LEFT URETEROSCOPY AND LASER LITHOTRIPSY LEFT STENT PLACEMENT;  Surgeon: Kristeen Peto, MD;  Location: [facility];  Service: Urology;  Laterality: Left;    HOLMIUM LASER APPLICATION Left 04/18/2013   Procedure: HOLMIUM LASER APPLICATION;  Surgeon: Kristeen Peto, MD;  Location: [facility];  Service: Urology;  Laterality: Left;   INCISION AND DRAINAGE ABSCESS Left 04/04/2020   Procedure: Otilio Block OF LEFT ARM HEMATOMA;  Surgeon: Lockie Rima, MD;  Location: [facility];  Service: General;  Laterality: Left;  left   IRRIGATION AND DEBRIDEMENT ABSCESS Left 10/05/2014   Procedure: Ples Brim D LEFT INDEX FINGER;  Surgeon: Mauricia South, MD;  Location: [facility];  Service: Plastics;  Laterality: Left;   KNEE SURGERY     bilateral ARTHROSCOPY X2 TO EACH KNEE   MELANOMA EXCISION  2018   MELANOMA EXCISION WITH SENTINEL LYMPH NODE BIOPSY Left 03/29/2020   Procedure: WIDE LOCAL EXCISION, ADVANCED FLAP CLOSURE LEFT ARM MELANOMA DEFECT WITH SENTINEL LYMPH NODE MAPPING AND BIOPSY;  Surgeon: Lockie Rima, MD;  Location: [facility];  Service: General;  Laterality: Left;   S/P cysto & stents for kidnedy stone 11/08 by Dr. Theotis Flake     S/P ELap 1986 w/excision of leiomyoma @ GE junction, Meckle's divertic resected, & cholecystectomy     SHOULDER SURGERY  04/2005   left by Dr. Murrell Arrant    Social History: Social History   Tobacco Use   Smoking status: Never   Smokeless tobacco: Never  Vaping Use   Vaping status: Never Used  Substance Use Topics   Alcohol use: No    Alcohol/week: 0.0 standard drinks of alcohol   Drug use: No    Allergies  Allergen Reactions   Morphine  Nausea Only, Other (See Comments) and Nausea And Vomiting    Can take with food and usually doesn't cause nausea   Atorvastatin      Intolerant - nausea/myalgias   Codeine Phosphate Nausea And Vomiting   Metformin  And Related Diarrhea    Family History  Problem Relation Age of Onset   Heart attack Mother    Dementia Brother    Colon cancer Neg Hx    Prostate cancer Neg Hx      Prior to Admission medications   Medication Sig Start Date End Date Taking? Authorizing Provider  Accu-Chek Softclix Lancets lancets  TEST BLOOD SUGAR UP TO THREE TIMES DAILY 01/23/20   [name] Galea, MD  aspirin  EC 81 MG tablet Take 81 mg by mouth every evening.    [provider]  Blood Glucose Calibration (ACCU-CHEK AVIVA) SOLN Use as directed monthly. E11.59  Insulin  dependent 12/06/18   [name] Galea, MD  Blood Glucose Monitoring Suppl (ACCU-CHEK AVIVA PLUS) w/Device KIT Check sugar up to 3 times daily. E11.59  Insulin  dependent 11/30/18   [name] Galea, MD  carvedilol  (COREG ) 12.5 MG tablet TAKE 1 TABLET BY MOUTH TWICE  DAILY WITH MEALS 03/10/23   [name] Galea, MD  COD LIVER OIL PO Take 1 capsule by mouth 2 (two) times daily.     [provider]  Continuous Glucose Receiver (FREESTYLE LIBRE 3 READER) DEVI 1 each by  Does not apply route as directed. 05/28/22   [name] Galea, MD  Continuous Glucose Sensor (FREESTYLE LIBRE 3 SENSOR) MISC Place 1 sensor on the skin every 14 days. Use to check glucose continuously 05/28/22   [name] Galea, MD  cyanocobalamin  (,VITAMIN B-12,) 1000 MCG/ML injection Inject 1 mL (1,000 mcg total) into the muscle every 30 (thirty) days. Dispense 3 of the 1 mL vials. 10/29/18   [name] Galea, MD  DROPLET PEN NEEDLES 30G X 8 MM MISC USE  WITH  INSULIN   PEN 02/03/19   [name] Galea, MD  escitalopram  (LEXAPRO ) 20 MG tablet Take 1 tablet (20 mg total) by mouth daily. 03/25/23   [name] Galea, MD  furosemide  (LASIX ) 20 MG tablet Take 1 tablet (20 mg total) by mouth daily. 04/17/23 04/16/24  Modena Andes, MD  hydrALAZINE  (APRESOLINE ) 50 MG tablet TAKE 1 TABLET BY MOUTH 3 TIMES  DAILY 03/10/23   [name] Galea, MD  insulin  glargine, 1 Unit Dial , ([name]  SOLOSTAR) 300 UNIT/ML Solostar Pen Inject 20 Units into the skin at bedtime. INJECT SUBCUTANEOUSLY 16  UNITS AT BEDTIME 04/17/23   Modena Andes, MD  insulin  lispro (HUMALOG  KWIKPEN) 100 UNIT/ML KwikPen INJECT SUBCUTANEOUSLY 4-10 UNITS 3 TIMES DAILY BEFORE MEALS PER  SLIDING SCALE: 151-200 = 4 UN,  201-250 = 6 UN, 251-300 = 8  UN,  AND OVER 300 = 10 UN 01/02/23   [name] Galea, MD  losartan  (COZAAR ) 25 MG tablet Take 1 tablet (25 mg total) by mouth daily. 04/17/23 04/16/24  Modena Andes, MD  meclizine  (ANTIVERT ) 12.5 MG tablet Take 1 tablet (12.5 mg total) by mouth 3 (three) times daily as needed for dizziness. 05/01/23   [name] Galea, MD  naproxen  sodium (ALEVE ) 220 MG tablet Take 220 mg by mouth daily as needed.    [provider]  ondansetron  (ZOFRAN ) 4 MG tablet Take 1 tablet (4 mg total) by mouth every 6 (six) hours. 03/06/23   Kingsley, Victoria K, DO  ONETOUCH ULTRA test strip USE TO CHECK UP TO 3 TIMES DAILY AS NEEDED 05/14/23   [name] Galea, MD  SYRINGE-NEEDLE, DISP, 3 ML (B-D 3CC LUER-LOK SYR 25GX1") 25G X 1" 3 ML MISC USE TO INJECT VITAMIN B12 MONTHLY. 08/22/19   [name] Galea, MD  tamsulosin  (FLOMAX ) 0.4 MG CAPS capsule TAKE 1 CAPSULE BY MOUTH DAILY  AFTER SUPPER 12/05/22   [name] Galea, MD  traMADol  (ULTRAM ) 50 MG tablet TAKE 1 TABLET BY MOUTH EVERY 12 HOURS AS NEEDED FOR KNEE PAIN. 05/24/23   [name] Galea, MD    Physical Exam: Vitals:   06/08/23 1432 06/08/23 1600 06/08/23 1854 06/08/23 1941  BP:  (!) 178/79 (!) 200/93 (!) 190/81  Pulse:  76 83 82  Resp:  (!) 22 (!) 24 (!) 25  Temp:   97.8 F (36.6 C)   TempSrc:   Oral   SpO2:  95% 96% 93%  Weight: 99.8 kg     Height: 6' (1.829 m)      Constitutional: Resting in bed with head elevated, NAD, calm, comfortable Eyes: EOMI, lids and conjunctivae normal ENMT: Mucous membranes are moist. Posterior pharynx clear of any exudate or lesions.Normal dentition.  Neck: normal, supple, no masses. Respiratory: clear to auscultation bilaterally, no wheezing, no crackles. Normal respiratory effort while on 2 L O2 via [place]. No accessory muscle use.  Cardiovascular: Regular rate and rhythm, no murmurs / rubs / gallops.  Trace bilateral lower extremity edema. 2+ pedal  pulses. Abdomen: no tenderness, no masses palpated. Musculoskeletal: no  clubbing / cyanosis. No joint deformity upper and lower extremities. Good ROM, no contractures. Normal muscle tone.  Skin: no rashes, lesions, ulcers. No induration Neurologic: Sensation intact. Strength 5/5 in all 4.  Psychiatric: Alert and oriented x 3. Normal mood.   EKG: Personally reviewed. Sinus rhythm, rate 89, PACs, no acute ischemic changes.  Similar to prior.  Assessment/Plan Principal Problem:   Acute on chronic combined systolic and diastolic heart failure (HCC) Active Problems:   Acute respiratory failure with hypoxia (HCC)   Hypertension associated with diabetes (HCC)   Type 2 diabetes mellitus with vascular disease (HCC)   Hypokalemia   BPH with obstruction/lower urinary tract symptoms   [name] is a 89 y.o. male with medical history significant for chronic combined systolic and diastolic CHF, insulin -dependent T2DM, HTN, HLD, BPH, anxiety who is admitted with acute on chronic combined systolic and diastolic CHF.  Assessment and Plan: Acute on chronic combined systolic and diastolic CHF: TTE 2/27 showed EF 35-40%.  Has trace peripheral edema.  BNP 872.4.  Hypoxic prior to arrival.  Overall seems to be mildly hypervolemic. - Continue IV Lasix  40 mg twice daily - Continue Coreg  12.5 mg twice daily - Continue losartan  25 mg daily - Continue Jardiance  10 mg daily - Monitor I/O's, daily weights  Acute respiratory failure with hypoxia: SpO2 reportedly in the 80s PTA.  Currently stable on 2 L O2 via [place].  Continue management as above and wean off supplemental oxygen as able.  Insulin -dependent type 2 diabetes: Hemoglobin A1c 8.3% 12/30/2022.  Placed on reduced [place]gle[place]  10 units nightly plus SSI.  Continue Jardiance .  Hypertension: Continue Coreg , hydralazine , losartan .  Hypokalemia: Supplementing.  BPH: Continue Flomax .    Abnormal UA: UA with few bacteria, small leukocytes, negative nitrates, 21-50 WBCs, 11-20 RBCs.  Patient was given IV ceftriaxone  in the  ED.  No dysuria.  Hold further antibiotics, follow urine culture.  Anxiety: Continue Lexapro .  Chronic neck and bilateral knee pain: Continue home tramadol  50 mg every 12 hours as needed.  Chronic intermittent lower extremity tremors: Has been evaluated by neurology, felt to be related to episodes of anxiety as he has seen improvement with Lexapro  and has had normal EEGs.   DVT prophylaxis: enoxaparin  (LOVENOX ) injection 40 mg Start: 06/08/23 2000 Code Status: Full code, confirmed with patient on admission Family Communication: Son at bedside Disposition Plan: From home, dispo pending clinical progress Consults called: None Severity of Illness: The appropriate patient status for this patient is OBSERVATION. Observation status is judged to be reasonable and necessary in order to provide the required intensity of service to ensure the patient's safety. The patient's presenting symptoms, physical exam findings, and initial radiographic and laboratory data in the context of their medical condition is felt to place them at decreased risk for further clinical deterioration. Furthermore, it is anticipated that the patient will be medically stable for discharge from the hospital within 2 midnights of admission.   Edith Gores MD [facility]  If 7PM-7AM, please contact night-coverage [email or web address]  06/08/2023, 8:08 PM

## 2023-06-09 ENCOUNTER — Encounter (HOSPITAL_COMMUNITY): Payer: Self-pay | Admitting: Internal Medicine

## 2023-06-09 ENCOUNTER — Telehealth: Payer: Self-pay | Admitting: Family Medicine

## 2023-06-09 ENCOUNTER — Inpatient Hospital Stay: Admitting: Family Medicine

## 2023-06-09 DIAGNOSIS — I5043 Acute on chronic combined systolic (congestive) and diastolic (congestive) heart failure: Secondary | ICD-10-CM | POA: Diagnosis not present

## 2023-06-09 LAB — CBG MONITORING, ED
Glucose-Capillary: 182 mg/dL — ABNORMAL HIGH (ref 70–99)
Glucose-Capillary: 196 mg/dL — ABNORMAL HIGH (ref 70–99)
Glucose-Capillary: 205 mg/dL — ABNORMAL HIGH (ref 70–99)

## 2023-06-09 LAB — BASIC METABOLIC PANEL WITH GFR
Anion gap: 12 (ref 5–15)
BUN: 20 mg/dL (ref 8–23)
CO2: 26 mmol/L (ref 22–32)
Calcium: 9.7 mg/dL (ref 8.9–10.3)
Chloride: 101 mmol/L (ref 98–111)
Creatinine, Ser: 1.05 mg/dL (ref 0.61–1.24)
GFR, Estimated: >60 mL/min (ref >60)
Glucose, Bld: 227 mg/dL — ABNORMAL HIGH (ref 70–99)
Potassium: 3.4 mmol/L — ABNORMAL LOW (ref 3.5–5.1)
Sodium: 139 mmol/L (ref 135–145)

## 2023-06-09 LAB — CBC
HCT: 43 % (ref 39.0–52.0)
Hemoglobin: 13.5 g/dL (ref 13.0–17.0)
MCH: 29.2 pg (ref 26.0–34.0)
MCHC: 31.4 g/dL (ref 30.0–36.0)
MCV: 92.9 fL (ref 80.0–100.0)
Platelets: 279 10*3/uL (ref 150–400)
RBC: 4.63 MIL/uL (ref 4.22–5.81)
RDW: 15.7 % — ABNORMAL HIGH (ref 11.5–15.5)
WBC: 7.4 10*3/uL (ref 4.0–10.5)
nRBC: 0 % (ref 0.0–0.2)

## 2023-06-09 LAB — MAGNESIUM: Magnesium: 1.7 mg/dL (ref 1.7–2.4)

## 2023-06-09 LAB — GLUCOSE, CAPILLARY: Glucose-Capillary: 194 mg/dL — ABNORMAL HIGH (ref 70–99)

## 2023-06-09 MED ORDER — TRAMADOL HCL 50 MG PO TABS
50.0000 mg | ORAL_TABLET | Freq: Four times a day (QID) | ORAL | Status: DC | PRN
  Administered 2023-06-09 – 2023-06-10 (×3): 50 mg via ORAL
  Filled 2023-06-09 (×3): qty 1

## 2023-06-09 NOTE — ED Notes (Signed)
 Arbutus Knoll, MD advised 2nd trop draw not needed

## 2023-06-09 NOTE — Telephone Encounter (Signed)
 Please let him know that I saw the intake charts and they are updating me in the meantime.  I hope he feels better soon.  Thanks.

## 2023-06-09 NOTE — Progress Notes (Signed)
 PROGRESS NOTE    [name]  ZOX:[id number] DOB: 1934-12-15 DOA: 06/08/2023 PCP: Donnie Galea, MD   Brief Narrative: This 89 y.o. male with medical history significant for chronic combined systolic and diastolic CHF, insulin -dependent T2DM, HTN, HLD, BPH, anxiety who presented to the ED for evaluation of shortness of breath / Hypoxia.  Patient has chronic issues with intermittent tremoring of lower extremities.  Patient was found to be hypoxic at home family has called EMS and he was found to be saturating 80% on room air requiring 4 L of oxygen and he was brought in the ED. He was recently admitted 2/26-3/7 for acute hypoxic respiratory failure due to acute on chronic CHF and influenza A.  TTE on 2/27 showed reduced EF 35-40% compared to previous 40-45% in 2022. Patient was given IV Lasix ,  IV ceftriaxone .Patient was admitted for further evaluation.   Assessment & Plan:   Principal Problem:   Acute on chronic combined systolic and diastolic heart failure (HCC) Active Problems:   Acute respiratory failure with hypoxia (HCC)   Hypertension associated with diabetes (HCC)   Type 2 diabetes mellitus with vascular disease (HCC)   Hypokalemia   BPH with obstruction/lower urinary tract symptoms   Acute on chronic combined systolic and diastolic CHF: Patient presented with peripheral edema, shortness of breath, hypoxia prior to arrival. He appears mildly hypervolemic on exam. Recent TTE 2/27 showed EF 35-40%. BNP 872.4.   - Continue IV Lasix  40 mg twice daily. - Continue Coreg  12.5 mg twice daily. - Continue losartan  25 mg daily. - Continue Jardiance  10 mg daily. - Monitor I/O's, daily weights.   Acute hypoxic respiratory failure: Likely die to above. SpO2 reportedly in the 80s PTA.  Currently stable on 2 L O2 via [place].   Continue management as above and wean off supplemental oxygen as able.   Insulin -dependent type 2 diabetes: Hemoglobin A1c 8.3% 12/30/2022.   Continue  Semglee  10 units nightly plus SSI.  Continue Jardiance .   Hypertension: Continue Coreg , hydralazine , losartan .   Hypokalemia: Replaced.  Continue to monitor   BPH: Continue Flomax .     Abnormal UA: UA with few bacteria, small leukocytes, negative nitrates, 21-50 WBCs, 11-20 RBCs.   Patient was given IV ceftriaxone  in the ED.  No dysuria.  Hold further antibiotics, follow urine culture.   Anxiety: Continue Lexapro .   Chronic neck and bilateral knee pain: Continue home tramadol  50 mg every 12 hours as needed.   Chronic intermittent lower extremity tremors: Has been evaluated by neurology, felt to be related to episodes of anxiety as he has seen improvement with Lexapro  and has had normal EEGs.  Continue Lexapro .   DVT prophylaxis: Lovenox  Code Status: Full code Family Communication: No family at bedside Disposition Plan:    Status is: Observation The patient remains OBS appropriate and will d/c before 2 midnights.   Admitted for likely acute on chronic systolic CHF.  Consultants:  None  Procedures: None  Antimicrobials:  Anti-infectives (From admission, onward)    Start     Dose/Rate Route Frequency Ordered Stop   06/08/23 1600  cefTRIAXone  (ROCEPHIN ) 1 g in sodium chloride  0.9 % 100 mL IVPB        1 g 200 mL/hr over 30 Minutes Intravenous  Once 06/08/23 1557 06/08/23 1655      Subjective: Patient was seen and examined at bedside. Overnight events noted. Patient was sitting comfortably on the bed,  states he is feeling better,  he reports improvement in his  leg swellings.  Objective: Vitals:   06/09/23 0800 06/09/23 0952 06/09/23 1100 06/09/23 1243  BP: (!) 176/90 (!) 158/102 (!) 166/95 (!) 156/72  Pulse: 70 78 65 73  Resp: 16 16 (!) 21 14  Temp:    97.6 F (36.4 C)  TempSrc:    Oral  SpO2: 98% 95% 97% 97%  Weight:      Height:        Intake/Output Summary (Last 24 hours) at 06/09/2023 1420 Last data filed at 06/08/2023 1713 Gross per 24 hour  Intake  100 ml  Output 325 ml  Net -225 ml   Filed Weights   06/08/23 1432  Weight: 99.8 kg    Examination:  General exam: Appears calm and comfortable, deconditioned, not in any acute distress. Respiratory system: CTA Bilaterally. Respiratory effort normal. RR 17 Cardiovascular system: S1 & S2 heard, RRR. No JVD, murmurs, rubs, gallops or clicks.  Gastrointestinal system: Abdomen is non distended, soft and non tender. Normal bowel sounds heard. Central nervous system: Alert and oriented X 3. No focal neurological deficits. Extremities: No edema, no cyanosis, no clubbing. Skin: No rashes, lesions or ulcers Psychiatry: Judgement and insight appear normal. Mood & affect appropriate.     Data Reviewed: I have personally reviewed following labs and imaging studies  CBC: Recent Labs  Lab 06/08/23 1428 06/08/23 1443 06/09/23 0742  WBC 9.5  --  7.4  NEUTROABS 7.0  --   --   HGB 13.2 13.6 13.5  HCT 39.9 40.0 43.0  MCV 90.9  --  92.9  PLT 268  --  279   Basic Metabolic Panel: Recent Labs  Lab 06/08/23 1428 06/08/23 1443 06/09/23 0742  NA 136 138 139  K 3.3* 3.3* 3.4*  CL 103 102 101  CO2 24  --  26  GLUCOSE 155* 153* 227*  BUN 15 13 20   CREATININE 1.15 1.20 1.05  CALCIUM  9.4  --  9.7  MG 1.8  --  1.7   GFR: Estimated Creatinine Clearance: 61.8 mL/min (by C-G formula based on SCr of 1.05 mg/dL). Liver Function Tests: Recent Labs  Lab 06/08/23 1428  AST 13*  ALT 13  ALKPHOS 61  BILITOT 0.8  PROT 6.5  ALBUMIN 3.6   No results for input(s): "LIPASE", "AMYLASE" in the last 168 hours. No results for input(s): "AMMONIA" in the last 168 hours. Coagulation Profile: No results for input(s): "INR", "PROTIME" in the last 168 hours. Cardiac Enzymes: No results for input(s): "CKTOTAL", "CKMB", "CKMBINDEX", "TROPONINI" in the last 168 hours. BNP (last 3 results) No results for input(s): "PROBNP" in the last 8760 hours. HbA1C: No results for input(s): "HGBA1C" in the last 72  hours. CBG: Recent Labs  Lab 06/08/23 2046 06/09/23 0113 06/09/23 0752 06/09/23 1238  GLUCAP 270* 182* 196* 205*   Lipid Profile: No results for input(s): "CHOL", "HDL", "LDLCALC", "TRIG", "CHOLHDL", "LDLDIRECT" in the last 72 hours. Thyroid  Function Tests: No results for input(s): "TSH", "T4TOTAL", "FREET4", "T3FREE", "THYROIDAB" in the last 72 hours. Anemia Panel: No results for input(s): "VITAMINB12", "FOLATE", "FERRITIN", "TIBC", "IRON", "RETICCTPCT" in the last 72 hours. Sepsis Labs: Recent Labs  Lab 06/08/23 1444  LATICACIDVEN 1.5    Recent Results (from the past 240 hours)  Resp panel by RT-PCR (RSV, Flu A&B, Covid) Anterior Nasal Swab     Status: None   Collection Time: 06/08/23  2:02 PM   Specimen: Anterior Nasal Swab  Result Value Ref Range Status   SARS Coronavirus 2 by RT PCR  NEGATIVE NEGATIVE Final    Comment: (NOTE) SARS-CoV-2 target nucleic acids are NOT DETECTED.  The SARS-CoV-2 RNA is generally detectable in upper respiratory specimens during the acute phase of infection. The lowest concentration of SARS-CoV-2 viral copies this assay can detect is 138 copies/mL. A negative result does not preclude SARS-Cov-2 infection and should not be used as the sole basis for treatment or other patient management decisions. A negative result may occur with  improper specimen collection/handling, submission of specimen other than nasopharyngeal swab, presence of viral mutation(s) within the areas targeted by this assay, and inadequate number of viral copies(<138 copies/mL). A negative result must be combined with clinical observations, patient history, and epidemiological information. The expected result is Negative.  Fact Sheet for Patients:  BloggerCourse.com  Fact Sheet for Healthcare Providers:  SeriousBroker.it  This test is no t yet approved or cleared by the [place]  FDA and  has been authorized for  detection and/or diagnosis of SARS-CoV-2 by FDA under an Emergency Use Authorization (EUA). This EUA will remain  in effect (meaning this test can be used) for the duration of the COVID-19 declaration under Section 564(b)(1) of the Act, 21 U.S.C.section 360bbb-3(b)(1), unless the authorization is terminated  or revoked sooner.       Influenza A by PCR NEGATIVE NEGATIVE Final   Influenza B by PCR NEGATIVE NEGATIVE Final    Comment: (NOTE) The Xpert Xpress SARS-CoV-2/FLU/RSV plus assay is intended as an aid in the diagnosis of influenza from Nasopharyngeal swab specimens and should not be used as a sole basis for treatment. Nasal washings and aspirates are unacceptable for Xpert Xpress SARS-CoV-2/FLU/RSV testing.  Fact Sheet for Patients: BloggerCourse.com  Fact Sheet for Healthcare Providers: SeriousBroker.it  This test is not yet approved or cleared by the [place]  FDA and has been authorized for detection and/or diagnosis of SARS-CoV-2 by FDA under an Emergency Use Authorization (EUA). This EUA will remain in effect (meaning this test can be used) for the duration of the COVID-19 declaration under Section 564(b)(1) of the Act, 21 U.S.C. section 360bbb-3(b)(1), unless the authorization is terminated or revoked.     Resp Syncytial Virus by PCR NEGATIVE NEGATIVE Final    Comment: (NOTE) Fact Sheet for Patients: BloggerCourse.com  Fact Sheet for Healthcare Providers: SeriousBroker.it  This test is not yet approved or cleared by the [place]  FDA and has been authorized for detection and/or diagnosis of SARS-CoV-2 by FDA under an Emergency Use Authorization (EUA). This EUA will remain in effect (meaning this test can be used) for the duration of the COVID-19 declaration under Section 564(b)(1) of the Act, 21 U.S.C. section 360bbb-3(b)(1), unless the authorization is  terminated or revoked.  Performed at [facility], [address] [place], [place] [address]   Blood culture (routine x 2)     Status: None (Preliminary result)   Collection Time: 06/08/23  2:28 PM   Specimen: BLOOD RIGHT WRIST  Result Value Ref Range Status   Specimen Description   Final    BLOOD RIGHT WRIST Performed at [facility], [address] [place], [place] [address]    Special Requests   Final    BOTTLES DRAWN AEROBIC AND ANAEROBIC Blood Culture adequate volume Performed at [facility], [address], [place], [place] [address]    Culture   Final    NO GROWTH < 12 HOURS Performed at [facility], [address], [place], [place] [address]    Report  Status PENDING  Incomplete  Urine Culture     Status: Abnormal (Preliminary result)   Collection Time: 06/08/23  2:48 PM   Specimen: Urine, Clean Catch  Result Value Ref Range Status   Specimen Description   Final    URINE, CLEAN CATCH Performed at [facility], [address], [place], [place] [address]    Special Requests   Final    NONE Performed at [facility], [address], [place], [place] [address]    Culture (A)  Final    >=100,000 COLONIES/mL GRAM NEGATIVE RODS SUSCEPTIBILITIES TO FOLLOW Performed at [facility], [address] [place], [place] [address]    Report Status PENDING  Incomplete  Blood culture (routine x 2)     Status: None (Preliminary result)   Collection Time: 06/08/23  4:23 PM   Specimen: BLOOD RIGHT FOREARM  Result Value Ref Range Status   Specimen Description   Final    BLOOD RIGHT FOREARM Performed at [facility], [address], [place], [place] [address]    Special Requests   Final    BOTTLES DRAWN AEROBIC AND ANAEROBIC Blood Culture results may not be optimal due to an inadequate volume of blood received in culture bottles Performed at [facility], [address], [place], [place] [address]    Culture   Final    NO GROWTH < 12 HOURS Performed at [facility], [address] [place], [place] [address]    Report Status PENDING  Incomplete     Radiology Studies: CT Cervical Spine Wo Contrast Result Date: 06/08/2023 CLINICAL DATA:  Acute neck pain EXAM: CT CERVICAL SPINE WITHOUT CONTRAST TECHNIQUE: Multidetector CT imaging of the cervical spine was performed without intravenous contrast. Multiplanar CT image reconstructions were also generated. RADIATION DOSE REDUCTION: This exam was performed according to the departmental dose-optimization program which includes automated exposure control, adjustment of the mA and/or kV according to patient size and/or use of iterative reconstruction technique. COMPARISON:  05/06/2022 FINDINGS: Alignment: Alignment is grossly anatomic. Skull base and vertebrae: No acute fracture. No primary bone lesion or focal pathologic process. Soft tissues and spinal canal: No prevertebral fluid or swelling. No visible canal hematoma. Disc levels: Stable severe hypertrophic changes at the C1-C2 interface. Mild diffuse disc space narrowing greatest at C3-4. Bridging anterior osteophytes are seen from C4 through T2. There is diffuse facet hypertrophy, with partial bony fusion across the C2-3 C4-5, and C5-6 facets. Upper chest: Airway is patent.  Lung apices are clear. Other: Reconstructed images demonstrate no additional findings. IMPRESSION: 1. No acute cervical spine fracture. 2. Severe multilevel cervical spondylosis and facet hypertrophy, without significant change since prior study. Electronically Signed   By: Bobbye Burrow M.D.   On: 06/08/2023 17:37   DG Chest Port 1 View Result Date: 06/08/2023 CLINICAL DATA:  Dyspnea. EXAM: PORTABLE CHEST 1 VIEW COMPARISON:  04/08/2023. FINDINGS: Low lung volume. There are probable atelectatic changes at the lung bases. Bilateral lung fields are otherwise clear. No acute consolidation  or lung collapse. Bilateral costophrenic angles are clear. Stable cardio-mediastinal silhouette. No acute osseous abnormalities. The soft tissues are within normal limits. IMPRESSION: No active disease. Electronically Signed   By: Beula Brunswick M.D.   On: 06/08/2023 16:17   Scheduled Meds:  carvedilol   12.5 mg Oral BID WC   empagliflozin   10 mg Oral Daily   enoxaparin  (LOVENOX ) injection  40 mg Subcutaneous Q24H   escitalopram   20 mg Oral Daily   furosemide   40 mg Intravenous Q12H   hydrALAZINE   50 mg Oral TID   insulin  aspart  0-9 Units Subcutaneous TID WC   insulin  glargine-yfgn  10 Units Subcutaneous QHS   losartan   25 mg Oral Daily   potassium chloride   10 mEq Oral Daily   sodium chloride  flush  3 mL Intravenous Q12H   tamsulosin   0.4 mg Oral QPC supper   Continuous Infusions:   LOS: 0 days    Time spent: 50 mins    Magdalene School, MD [facility]   If 7PM-7AM, please contact night-coverage

## 2023-06-09 NOTE — Telephone Encounter (Signed)
 Copied from CRM [id number]. Topic: General - Other >> Jun 09, 2023 10:08 AM [employer] wrote: Reason for CRM: Patient's son, Bambi Lever, would like to notify Dr. Vallarie Gauze that patient is in the hospital.  Callback #: [id number]

## 2023-06-10 DIAGNOSIS — E876 Hypokalemia: Secondary | ICD-10-CM | POA: Diagnosis present

## 2023-06-10 DIAGNOSIS — E1169 Type 2 diabetes mellitus with other specified complication: Secondary | ICD-10-CM | POA: Diagnosis present

## 2023-06-10 DIAGNOSIS — N401 Enlarged prostate with lower urinary tract symptoms: Secondary | ICD-10-CM | POA: Diagnosis present

## 2023-06-10 DIAGNOSIS — F419 Anxiety disorder, unspecified: Secondary | ICD-10-CM | POA: Diagnosis present

## 2023-06-10 DIAGNOSIS — Z7984 Long term (current) use of oral hypoglycemic drugs: Secondary | ICD-10-CM | POA: Diagnosis not present

## 2023-06-10 DIAGNOSIS — Z79899 Other long term (current) drug therapy: Secondary | ICD-10-CM | POA: Diagnosis not present

## 2023-06-10 DIAGNOSIS — N138 Other obstructive and reflux uropathy: Secondary | ICD-10-CM | POA: Diagnosis present

## 2023-06-10 DIAGNOSIS — M25562 Pain in left knee: Secondary | ICD-10-CM | POA: Diagnosis present

## 2023-06-10 DIAGNOSIS — Z888 Allergy status to other drugs, medicaments and biological substances status: Secondary | ICD-10-CM | POA: Diagnosis not present

## 2023-06-10 DIAGNOSIS — I5043 Acute on chronic combined systolic (congestive) and diastolic (congestive) heart failure: Secondary | ICD-10-CM | POA: Diagnosis not present

## 2023-06-10 DIAGNOSIS — I110 Hypertensive heart disease with heart failure: Secondary | ICD-10-CM | POA: Diagnosis present

## 2023-06-10 DIAGNOSIS — J9601 Acute respiratory failure with hypoxia: Secondary | ICD-10-CM | POA: Diagnosis present

## 2023-06-10 DIAGNOSIS — Z794 Long term (current) use of insulin: Secondary | ICD-10-CM | POA: Diagnosis not present

## 2023-06-10 DIAGNOSIS — I509 Heart failure, unspecified: Secondary | ICD-10-CM

## 2023-06-10 DIAGNOSIS — G8929 Other chronic pain: Secondary | ICD-10-CM | POA: Diagnosis present

## 2023-06-10 DIAGNOSIS — N179 Acute kidney failure, unspecified: Secondary | ICD-10-CM | POA: Diagnosis not present

## 2023-06-10 DIAGNOSIS — Z1152 Encounter for screening for COVID-19: Secondary | ICD-10-CM | POA: Diagnosis not present

## 2023-06-10 DIAGNOSIS — M25561 Pain in right knee: Secondary | ICD-10-CM | POA: Diagnosis present

## 2023-06-10 DIAGNOSIS — Z885 Allergy status to narcotic agent status: Secondary | ICD-10-CM | POA: Diagnosis not present

## 2023-06-10 DIAGNOSIS — Z85820 Personal history of malignant melanoma of skin: Secondary | ICD-10-CM | POA: Diagnosis not present

## 2023-06-10 DIAGNOSIS — Z7982 Long term (current) use of aspirin: Secondary | ICD-10-CM | POA: Diagnosis not present

## 2023-06-10 DIAGNOSIS — M542 Cervicalgia: Secondary | ICD-10-CM | POA: Diagnosis present

## 2023-06-10 DIAGNOSIS — Z8249 Family history of ischemic heart disease and other diseases of the circulatory system: Secondary | ICD-10-CM | POA: Diagnosis not present

## 2023-06-10 DIAGNOSIS — I2510 Atherosclerotic heart disease of native coronary artery without angina pectoris: Secondary | ICD-10-CM | POA: Diagnosis present

## 2023-06-10 DIAGNOSIS — R0602 Shortness of breath: Secondary | ICD-10-CM | POA: Diagnosis present

## 2023-06-10 DIAGNOSIS — E785 Hyperlipidemia, unspecified: Secondary | ICD-10-CM | POA: Diagnosis present

## 2023-06-10 LAB — GLUCOSE, CAPILLARY
Glucose-Capillary: 196 mg/dL — ABNORMAL HIGH (ref 70–99)
Glucose-Capillary: 205 mg/dL — ABNORMAL HIGH (ref 70–99)
Glucose-Capillary: 158 mg/dL — ABNORMAL HIGH (ref 70–99)
Glucose-Capillary: 205 mg/dL — ABNORMAL HIGH (ref 70–99)

## 2023-06-10 LAB — URINE CULTURE: Culture: >=100000 — AB

## 2023-06-10 LAB — BASIC METABOLIC PANEL WITH GFR
Anion gap: 10 (ref 5–15)
BUN: 25 mg/dL — ABNORMAL HIGH (ref 8–23)
CO2: 30 mmol/L (ref 22–32)
Calcium: 9.6 mg/dL (ref 8.9–10.3)
Chloride: 96 mmol/L — ABNORMAL LOW (ref 98–111)
Creatinine, Ser: 1.61 mg/dL — ABNORMAL HIGH (ref 0.61–1.24)
GFR, Estimated: 41 mL/min — ABNORMAL LOW (ref >60)
Glucose, Bld: 203 mg/dL — ABNORMAL HIGH (ref 70–99)
Potassium: 3.3 mmol/L — ABNORMAL LOW (ref 3.5–5.1)
Sodium: 136 mmol/L (ref 135–145)

## 2023-06-10 MED ORDER — POTASSIUM CHLORIDE CRYS ER 20 MEQ PO TBCR
40.0000 meq | EXTENDED_RELEASE_TABLET | Freq: Once | ORAL | Status: AC
  Administered 2023-06-10: 40 meq via ORAL
  Filled 2023-06-10: qty 2

## 2023-06-10 MED ORDER — METHOCARBAMOL 500 MG PO TABS: 500.0000 mg | ORAL_TABLET | Freq: Four times a day (QID) | ORAL | Status: DC | PRN

## 2023-06-10 MED ORDER — DICLOFENAC SODIUM 1 % EX GEL
2.0000 g | Freq: Four times a day (QID) | CUTANEOUS | Status: DC
  Administered 2023-06-10 – 2023-06-11 (×5): 2 g via TOPICAL
  Filled 2023-06-10: qty 100

## 2023-06-10 NOTE — TOC Initial Note (Signed)
 Transition of Care [facility]) - Initial/Assessment Note    Patient Details  Name: [name] MRN: [id number] Date of Birth: 1934/02/26  Transition of Care [facility]) CM/SW Contact:    Ruben Corolla, RN Phone Number: 06/10/2023, 3:39 PM  Clinical Narrative: d/c plan home. Has own transport home.                  Expected Discharge Plan: Home/Self Care Barriers to Discharge: Continued Medical Work up   Patient Goals and CMS Choice Patient states their goals for this hospitalization and ongoing recovery are:: Home CMS Medicare.gov Compare Post Acute Care list provided to:: Patient Choice offered to / list presented to : Patient [place] ownership interest in [facility].provided to:: Adult Children    Expected Discharge Plan and Services   Discharge Planning Services: CM Consult   Living arrangements for the past 2 months: Single Family Home                                      Prior Living Arrangements/Services Living arrangements for the past 2 months: Single Family Home Lives with:: Adult Children   Do you feel safe going back to the place where you live?: Yes               Activities of Daily Living   ADL Screening (condition at time of admission) Independently performs ADLs?: Yes (appropriate for developmental age) Is the patient deaf or have difficulty hearing?: Yes Does the patient have difficulty seeing, even when wearing glasses/contacts?: No Does the patient have difficulty concentrating, remembering, or making decisions?: No  Permission Sought/Granted Permission sought to share information with : Case Manager Permission granted to share information with : Yes, Verbal Permission Granted  Share Information with NAME: Case Manager           Emotional Assessment              Admission diagnosis:  Acute on chronic combined systolic and diastolic heart failure (HCC) [I50.43] Acute respiratory failure with hypoxia (HCC)  [J96.01] Cystitis [N30.90] Acute on chronic congestive heart failure, unspecified heart failure type (HCC) [I50.9] CHF (congestive heart failure) (HCC) [I50.9] Patient Active Problem List   Diagnosis Date Noted   CHF (congestive heart failure) (HCC) 06/10/2023   Acute on chronic combined systolic and diastolic heart failure (HCC) 06/08/2023   Obesity, class 1 04/11/2023   Acute respiratory failure with hypoxia (HCC) 04/09/2023   Acute on chronic systolic CHF (congestive heart failure) (HCC) 04/09/2023   Psychogenic nonepileptic seizure 02/01/2023   Obesity (BMI 30-39.9) 01/23/2023   Memory change 07/31/2022   Drug-induced myopathy 07/28/2021   Tachycardia 01/09/2021   DNR (do not resuscitate) 04/13/2020   Hematoma 04/05/2020   Epigastric abdominal pain 04/01/2020   History of melanoma 2022   Medicare annual wellness visit, subsequent 01/03/2019   Recurrent Epistaxis 03/02/2017   Vitamin B 12 deficiency 08/17/2015   Elevated TSH 10/06/2014   Unsteady gait 09/18/2014   Midline low back pain without sciatica 09/18/2014   Tremor 05/04/2014   Chronic combined systolic and diastolic congestive heart failure (HCC) 01/27/2014   Ataxia 01/10/2014   UTI (urinary tract infection) 01/02/2014   History of coronary artery disease    Hypokalemia    Chest pain 12/15/2013   Healthcare maintenance 11/10/2013   Advance care planning 11/10/2013   ED (erectile dysfunction) 06/24/2013   HLD (hyperlipidemia) 03/22/2012  Type 2 diabetes mellitus with vascular disease (HCC) 03/22/2012   Thyroid  cyst 03/19/2011   BPH with obstruction/lower urinary tract symptoms 03/15/2010   GASTRITIS 02/21/2007   Anxiety 02/15/2007   CAD (coronary artery disease) 02/15/2007   DIVERTICULOSIS OF COLON 02/15/2007   COLONIC POLYPS 02/12/2007   Hypertension associated with diabetes (HCC) 02/12/2007   RENAL CALCULUS 02/12/2007   Osteoarthritis 02/12/2007   PCP:  Donnie Galea, MD Pharmacy:   [facility] -  [place], [place] - [address] [place] [address] Phone: [phone] Fax: [phone]  [facility]) - [place], [place] - [address] [facility] [address] [facility] [address] [place] [address] [place] [address] Phone: [phone] Fax: [phone]  [facility] - [place], [place] - [address] [place] [address] Phone: [phone] Fax: [phone]  [facility] [address] [place] [address] Phone: [phone] Fax: [phone]     Social Drivers of Health (SDOH) Social History: SDOH Screenings   Food Insecurity: No Food Insecurity (06/09/2023)  Housing: Low Risk  (06/09/2023)  Transportation Needs: No Transportation Needs (06/09/2023)  Utilities: Not At Risk (06/09/2023)  Alcohol Screen: Low Risk  (07/22/2022)  Depression (PHQ2-9): Low Risk  (03/24/2023)  Financial Resource Strain: Low Risk  (07/22/2022)  Physical Activity: Sufficiently Active (07/22/2022)  Social Connections: Moderately Isolated (06/09/2023)  Stress: No Stress Concern Present (07/22/2022)  Tobacco Use: Low Risk  (06/09/2023)   SDOH Interventions:     Readmission Risk Interventions    04/17/2023   10:53 AM 04/10/2023    1:41 PM  Readmission Risk Prevention Plan  Transportation Screening Complete Complete  PCP or Specialist Appt within 5-7 Days Complete Complete  Home Care Screening Complete Complete  Medication Review (RN CM) Complete Complete

## 2023-06-10 NOTE — Telephone Encounter (Signed)
 I will send a staff message to inpatient MD in the EMR.  Thanks.

## 2023-06-10 NOTE — Progress Notes (Signed)
 PROGRESS NOTE    [name]  ZOX:[id number] DOB: 1934/02/12 DOA: 06/08/2023 PCP: Donnie Galea, MD    Brief Narrative:  [name] is a 89 y.o. male with medical history significant for chronic combined systolic and diastolic CHF, insulin -dependent T2DM, HTN, HLD, BPH, anxiety who is admitted with acute on chronic combined systolic and diastolic CHF.    Assessment and Plan: Acute on chronic combined systolic and diastolic CHF: Patient presented with peripheral edema, shortness of breath, hypoxia prior to arrival. He appears hypervolemic on exam. Recent TTE 2/27 showed EF 35-40%. BNP 872.4.   -s/p lasix  and will hold further doses due to AKI - Continue Coreg  12.5 mg twice daily. - Continue losartan  25 mg daily. - Continue Jardiance  10 mg daily. - Monitor I/O's, daily weights.-- down 8lbs   Acute hypoxic respiratory failure: -wean to RA   Insulin -dependent type 2 diabetes: Hemoglobin A1c 8.3% 12/30/2022.   Continue Semglee  10 units nightly plus SSI.  Continue Jardiance .   Hypertension: Continue Coreg , hydralazine , losartan .   Hypokalemia: Replaced.  Continue to monitor   BPH: Continue Flomax .     Abnormal UA: UA with few bacteria, small leukocytes, negative nitrates, 21-50 WBCs, 11-20 RBCs.   Patient was given IV ceftriaxone  in the ED -culture showing citrobacter   Anxiety: Continue Lexapro .   Chronic neck and bilateral knee pain: -voltaren  gel and PRN robaxin   Chronic intermittent lower extremity tremors: Has been evaluated by neurology, felt to be related to episodes of anxiety as he has seen improvement with Lexapro  and has had normal EEGs.  Continue Lexapro .   DVT prophylaxis: enoxaparin  (LOVENOX ) injection 40 mg Start: 06/08/23 2000    Code Status: Full Code Family Communication: called son but no answer x 2- LM again  Disposition Plan:  Level of care: Telemetry Status is: Observation   Consultants:  none   Subjective: Main issue  is neck pain Denies SOB  Objective: Vitals:   06/10/23 0425 06/10/23 0600 06/10/23 0818 06/10/23 1034  BP: (!) 165/95  (!) 188/103 (!) 147/69  Pulse: 64  67   Resp: 18     Temp: 98.2 F (36.8 C)     TempSrc: Oral     SpO2: 96%     Weight:  96.6 kg    Height:        Intake/Output Summary (Last 24 hours) at 06/10/2023 1244 Last data filed at 06/10/2023 0216 Gross per 24 hour  Intake 480 ml  Output 2000 ml  Net -1520 ml   Filed Weights   06/08/23 1432 06/10/23 0600  Weight: 99.8 kg 96.6 kg    Examination:   General: Appearance:     Overweight male in no acute distress     Lungs:     Off O2, respirations unlabored  Heart:    Normal heart rate   MS:   All extremities are intact.    Neurologic:   Awake, alert       Data Reviewed: I have personally reviewed following labs and imaging studies  CBC: Recent Labs  Lab 06/08/23 1428 06/08/23 1443 06/09/23 0742  WBC 9.5  --  7.4  NEUTROABS 7.0  --   --   HGB 13.2 13.6 13.5  HCT 39.9 40.0 43.0  MCV 90.9  --  92.9  PLT 268  --  279   Basic Metabolic Panel: Recent Labs  Lab 06/08/23 1428 06/08/23 1443 06/09/23 0742 06/10/23 0823  NA 136 138 139 136  K 3.3* 3.3*  3.4* 3.3*  CL 103 102 101 96*  CO2 24  --  26 30  GLUCOSE 155* 153* 227* 203*  BUN 15 13 20  25*  CREATININE 1.15 1.20 1.05 1.61*  CALCIUM  9.4  --  9.7 9.6  MG 1.8  --  1.7  --    GFR: Estimated Creatinine Clearance: 39.7 mL/min (A) (by C-G formula based on SCr of 1.61 mg/dL (H)). Liver Function Tests: Recent Labs  Lab 06/08/23 1428  AST 13*  ALT 13  ALKPHOS 61  BILITOT 0.8  PROT 6.5  ALBUMIN 3.6   No results for input(s): "LIPASE", "AMYLASE" in the last 168 hours. No results for input(s): "AMMONIA" in the last 168 hours. Coagulation Profile: No results for input(s): "INR", "PROTIME" in the last 168 hours. Cardiac Enzymes: No results for input(s): "CKTOTAL", "CKMB", "CKMBINDEX", "TROPONINI" in the last 168 hours. BNP (last 3  results) No results for input(s): "PROBNP" in the last 8760 hours. HbA1C: No results for input(s): "HGBA1C" in the last 72 hours. CBG: Recent Labs  Lab 06/09/23 0752 06/09/23 1238 06/09/23 1708 06/10/23 0736 06/10/23 1146  GLUCAP 196* 205* 194* 196* 205*   Lipid Profile: No results for input(s): "CHOL", "HDL", "LDLCALC", "TRIG", "CHOLHDL", "LDLDIRECT" in the last 72 hours. Thyroid  Function Tests: No results for input(s): "TSH", "T4TOTAL", "FREET4", "T3FREE", "THYROIDAB" in the last 72 hours. Anemia Panel: No results for input(s): "VITAMINB12", "FOLATE", "FERRITIN", "TIBC", "IRON", "RETICCTPCT" in the last 72 hours. Sepsis Labs: Recent Labs  Lab 06/08/23 1444  LATICACIDVEN 1.5    Recent Results (from the past 240 hours)  Resp panel by RT-PCR (RSV, Flu A&B, Covid) Anterior Nasal Swab     Status: None   Collection Time: 06/08/23  2:02 PM   Specimen: Anterior Nasal Swab  Result Value Ref Range Status   SARS Coronavirus 2 by RT PCR NEGATIVE NEGATIVE Final    Comment: (NOTE) SARS-CoV-2 target nucleic acids are NOT DETECTED.  The SARS-CoV-2 RNA is generally detectable in upper respiratory specimens during the acute phase of infection. The lowest concentration of SARS-CoV-2 viral copies this assay can detect is 138 copies/mL. A negative result does not preclude SARS-Cov-2 infection and should not be used as the sole basis for treatment or other patient management decisions. A negative result may occur with  improper specimen collection/handling, submission of specimen other than nasopharyngeal swab, presence of viral mutation(s) within the areas targeted by this assay, and inadequate number of viral copies(<138 copies/mL). A negative result must be combined with clinical observations, patient history, and epidemiological information. The expected result is Negative.  Fact Sheet for Patients:  BloggerCourse.com  Fact Sheet for Healthcare Providers:   SeriousBroker.it  This test is no t yet approved or cleared by the [place]  FDA and  has been authorized for detection and/or diagnosis of SARS-CoV-2 by FDA under an Emergency Use Authorization (EUA). This EUA will remain  in effect (meaning this test can be used) for the duration of the COVID-19 declaration under Section 564(b)(1) of the Act, 21 U.S.C.section 360bbb-3(b)(1), unless the authorization is terminated  or revoked sooner.       Influenza A by PCR NEGATIVE NEGATIVE Final   Influenza B by PCR NEGATIVE NEGATIVE Final    Comment: (NOTE) The Xpert Xpress SARS-CoV-2/FLU/RSV plus assay is intended as an aid in the diagnosis of influenza from Nasopharyngeal swab specimens and should not be used as a sole basis for treatment. Nasal washings and aspirates are unacceptable for Xpert Xpress SARS-CoV-2/FLU/RSV testing.  Fact  Sheet for Patients: BloggerCourse.com  Fact Sheet for Healthcare Providers: SeriousBroker.it  This test is not yet approved or cleared by the [place]  FDA and has been authorized for detection and/or diagnosis of SARS-CoV-2 by FDA under an Emergency Use Authorization (EUA). This EUA will remain in effect (meaning this test can be used) for the duration of the COVID-19 declaration under Section 564(b)(1) of the Act, 21 U.S.C. section 360bbb-3(b)(1), unless the authorization is terminated or revoked.     Resp Syncytial Virus by PCR NEGATIVE NEGATIVE Final    Comment: (NOTE) Fact Sheet for Patients: BloggerCourse.com  Fact Sheet for Healthcare Providers: SeriousBroker.it  This test is not yet approved or cleared by the [place]  FDA and has been authorized for detection and/or diagnosis of SARS-CoV-2 by FDA under an Emergency Use Authorization (EUA). This EUA will remain in effect (meaning this test can be used) for  the duration of the COVID-19 declaration under Section 564(b)(1) of the Act, 21 U.S.C. section 360bbb-3(b)(1), unless the authorization is terminated or revoked.  Performed at [facility], [address] [place], [place] [address]   Blood culture (routine x 2)     Status: None (Preliminary result)   Collection Time: 06/08/23  2:28 PM   Specimen: BLOOD RIGHT WRIST  Result Value Ref Range Status   Specimen Description   Final    BLOOD RIGHT WRIST Performed at [facility], [address] [place], [place] [address]    Special Requests   Final    BOTTLES DRAWN AEROBIC AND ANAEROBIC Blood Culture adequate volume Performed at [facility], [address] [place], [place] [address]    Culture   Final    NO GROWTH 2 DAYS Performed at [facility], [address] [place], [place] [address]    Report Status PENDING  Incomplete  Urine Culture     Status: Abnormal   Collection Time: 06/08/23  2:48 PM   Specimen: Urine, Clean Catch  Result Value Ref Range Status   Specimen Description   Final    URINE, CLEAN CATCH Performed at [facility], [address] [place], [place] [address]    Special Requests   Final    NONE Performed at [facility], [address] [place], [place] [address]    Culture >=100,000 COLONIES/mL CITROBACTER SPECIES (A)  Final   Report Status 06/10/2023 FINAL  Final   Organism ID, Bacteria CITROBACTER SPECIES (A)  Final      Susceptibility   Citrobacter species - MIC*    CEFEPIME <=0.12 SENSITIVE Sensitive     CEFTRIAXONE  <=0.25 SENSITIVE Sensitive     CIPROFLOXACIN  0.5 INTERMEDIATE Intermediate     GENTAMICIN <=1 SENSITIVE Sensitive     IMIPENEM 2 SENSITIVE Sensitive     NITROFURANTOIN 32 SENSITIVE Sensitive     TRIMETH /SULFA  <=20 SENSITIVE Sensitive     PIP/TAZO <=4 SENSITIVE Sensitive ug/mL    * >=100,000 COLONIES/mL CITROBACTER SPECIES  Blood culture (routine x  2)     Status: None (Preliminary result)   Collection Time: 06/08/23  4:23 PM   Specimen: BLOOD RIGHT FOREARM  Result Value Ref Range Status   Specimen Description   Final    BLOOD RIGHT FOREARM Performed at [facility], [address], [place], [place] [address]    Special Requests   Final    BOTTLES DRAWN AEROBIC AND ANAEROBIC Blood Culture results may not be optimal due to an  inadequate volume of blood received in culture bottles Performed at [facility], [address], [place], [place] [address]    Culture   Final    NO GROWTH 2 DAYS Performed at [facility], [address] [place], [place] [address]    Report Status PENDING  Incomplete         Radiology Studies: CT Cervical Spine Wo Contrast Result Date: 06/08/2023 CLINICAL DATA:  Acute neck pain EXAM: CT CERVICAL SPINE WITHOUT CONTRAST TECHNIQUE: Multidetector CT imaging of the cervical spine was performed without intravenous contrast. Multiplanar CT image reconstructions were also generated. RADIATION DOSE REDUCTION: This exam was performed according to the departmental dose-optimization program which includes automated exposure control, adjustment of the mA and/or kV according to patient size and/or use of iterative reconstruction technique. COMPARISON:  05/06/2022 FINDINGS: Alignment: Alignment is grossly anatomic. Skull base and vertebrae: No acute fracture. No primary bone lesion or focal pathologic process. Soft tissues and spinal canal: No prevertebral fluid or swelling. No visible canal hematoma. Disc levels: Stable severe hypertrophic changes at the C1-C2 interface. Mild diffuse disc space narrowing greatest at C3-4. Bridging anterior osteophytes are seen from C4 through T2. There is diffuse facet hypertrophy, with partial bony fusion across the C2-3 C4-5, and C5-6 facets. Upper chest: Airway is patent.  Lung apices are clear. Other: Reconstructed images demonstrate no additional findings.  IMPRESSION: 1. No acute cervical spine fracture. 2. Severe multilevel cervical spondylosis and facet hypertrophy, without significant change since prior study. Electronically Signed   By: Bobbye Burrow M.D.   On: 06/08/2023 17:37   DG Chest Port 1 View Result Date: 06/08/2023 CLINICAL DATA:  Dyspnea. EXAM: PORTABLE CHEST 1 VIEW COMPARISON:  04/08/2023. FINDINGS: Low lung volume. There are probable atelectatic changes at the lung bases. Bilateral lung fields are otherwise clear. No acute consolidation or lung collapse. Bilateral costophrenic angles are clear. Stable cardio-mediastinal silhouette. No acute osseous abnormalities. The soft tissues are within normal limits. IMPRESSION: No active disease. Electronically Signed   By: Beula Brunswick M.D.   On: 06/08/2023 16:17        Scheduled Meds:  carvedilol   12.5 mg Oral BID WC   diclofenac  Sodium  2 g Topical QID   empagliflozin   10 mg Oral Daily   enoxaparin  (LOVENOX ) injection  40 mg Subcutaneous Q24H   escitalopram   20 mg Oral Daily   hydrALAZINE   50 mg Oral TID   insulin  aspart  0-9 Units Subcutaneous TID WC   insulin  glargine-yfgn  10 Units Subcutaneous QHS   losartan   25 mg Oral Daily   potassium chloride   10 mEq Oral Daily   potassium chloride   40 mEq Oral Once   sodium chloride  flush  3 mL Intravenous Q12H   tamsulosin   0.4 mg Oral QPC supper   Continuous Infusions:   LOS: 0 days    Time spent: 45 minutes spent on chart review, discussion with nursing staff, consultants, updating family and interview/physical exam; more than 50% of that time was spent in counseling and/or coordination of care.    Enrigue Harvard, DO [facility] Available via Epic secure chat 7am-7pm After these hours, please refer to coverage provider listed on [email or web address] 06/10/2023, 12:44 PM

## 2023-06-10 NOTE — Progress Notes (Signed)
 Pt ambulated ~249ft, on RA O2 Sat 94%, O2 lowest 89% but increased quickly. Pt got SOB once, but recovered fine. MD aware

## 2023-06-10 NOTE — Telephone Encounter (Signed)
 Spoke with patients son and he is grateful for the call. He is not sure if you are able to speak with who is working on his father care at the hospital but if you can to reach out and maybe you all can figure out together what may be going on with the patient.

## 2023-06-10 NOTE — Progress Notes (Signed)
 Called son to update,  had to LM JV

## 2023-06-11 ENCOUNTER — Other Ambulatory Visit (HOSPITAL_COMMUNITY): Payer: Self-pay

## 2023-06-11 DIAGNOSIS — I5043 Acute on chronic combined systolic (congestive) and diastolic (congestive) heart failure: Secondary | ICD-10-CM | POA: Diagnosis not present

## 2023-06-11 LAB — BASIC METABOLIC PANEL WITH GFR
Anion gap: 12 (ref 5–15)
BUN: 26 mg/dL — ABNORMAL HIGH (ref 8–23)
CO2: 27 mmol/L (ref 22–32)
Calcium: 9.4 mg/dL (ref 8.9–10.3)
Chloride: 97 mmol/L — ABNORMAL LOW (ref 98–111)
Creatinine, Ser: 1.17 mg/dL (ref 0.61–1.24)
GFR, Estimated: >60 mL/min (ref >60)
Glucose, Bld: 189 mg/dL — ABNORMAL HIGH (ref 70–99)
Potassium: 3 mmol/L — ABNORMAL LOW (ref 3.5–5.1)
Sodium: 136 mmol/L (ref 135–145)

## 2023-06-11 LAB — GLUCOSE, CAPILLARY
Glucose-Capillary: 248 mg/dL — ABNORMAL HIGH (ref 70–99)
Glucose-Capillary: 222 mg/dL — ABNORMAL HIGH (ref 70–99)

## 2023-06-11 MED ORDER — MAGNESIUM SULFATE 2 GM/50ML IV SOLN
2.0000 g | Freq: Once | INTRAVENOUS | Status: AC
  Administered 2023-06-11: 2 g via INTRAVENOUS
  Filled 2023-06-11: qty 50

## 2023-06-11 MED ORDER — POTASSIUM CHLORIDE CRYS ER 20 MEQ PO TBCR
40.0000 meq | EXTENDED_RELEASE_TABLET | Freq: Once | ORAL | Status: AC
  Administered 2023-06-11: 40 meq via ORAL
  Filled 2023-06-11: qty 2

## 2023-06-11 MED ORDER — POTASSIUM CHLORIDE CRYS ER 20 MEQ PO TBCR
40.0000 meq | EXTENDED_RELEASE_TABLET | Freq: Every day | ORAL | Status: DC
  Administered 2023-06-11: 40 meq via ORAL
  Filled 2023-06-11: qty 2

## 2023-06-11 MED ORDER — FUROSEMIDE 40 MG PO TABS
40.0000 mg | ORAL_TABLET | Freq: Every day | ORAL | 1 refills | Status: DC
  Filled 2023-06-11: qty 30, 30d supply, fill #0

## 2023-06-11 MED ORDER — FUROSEMIDE 40 MG PO TABS: 40.0000 mg | ORAL_TABLET | Freq: Every day | ORAL | Status: DC

## 2023-06-11 MED ORDER — DICLOFENAC SODIUM 1 % EX GEL: 2.0000 g | Freq: Four times a day (QID) | CUTANEOUS | Status: AC

## 2023-06-11 MED ORDER — POTASSIUM CHLORIDE CRYS ER 20 MEQ PO TBCR
40.0000 meq | EXTENDED_RELEASE_TABLET | Freq: Every day | ORAL | 1 refills | Status: DC
  Filled 2023-06-11: qty 60, 30d supply, fill #0

## 2023-06-11 NOTE — Progress Notes (Signed)
 Called son-- again no answer-- left message JV

## 2023-06-11 NOTE — Progress Notes (Signed)
 SATURATION QUALIFICATIONS: (This note is used to comply with regulatory documentation for home oxygen)  Patient Saturations on Room Air at Rest = 93%  Patient Saturations on Room Air while Ambulating = 93%  Patient did not require any supplement oxygen to maintain saturation level above 90%.  Korine Winton, Connell Degree, RN

## 2023-06-11 NOTE — TOC Transition Note (Signed)
 Transition of Care [facility]) - Discharge Note   Patient Details  Name: [name] MRN: [id number] Date of Birth: 1934/04/26  Transition of Care [facility] CM/SW Contact:  Ruben Corolla, RN Phone Number: 06/11/2023, 11:19 AM   Clinical Narrative: d/c plan home. Just spoke to Bambi Lever son on his way up to rm shortly for p/u. No further CM needs.      Final next level of care: Home/Self Care Barriers to Discharge: No Barriers Identified   Patient Goals and CMS Choice Patient states their goals for this hospitalization and ongoing recovery are:: Home CMS Medicare.gov Compare Post Acute Care list provided to:: Patient Represenative (must comment) Choice offered to / list presented to : Patient, Adult Children [place] ownership interest in [facility].provided to:: Adult Children    Discharge Placement                       Discharge Plan and Services Additional resources added to the After Visit Summary for     Discharge Planning Services: CM Consult                                 Social Drivers of Health (SDOH) Interventions SDOH Screenings   Food Insecurity: No Food Insecurity (06/09/2023)  Housing: Low Risk  (06/09/2023)  Transportation Needs: No Transportation Needs (06/09/2023)  Utilities: Not At Risk (06/09/2023)  Alcohol Screen: Low Risk  (07/22/2022)  Depression (PHQ2-9): Low Risk  (03/24/2023)  Financial Resource Strain: Low Risk  (07/22/2022)  Physical Activity: Sufficiently Active (07/22/2022)  Social Connections: Moderately Isolated (06/09/2023)  Stress: No Stress Concern Present (07/22/2022)  Tobacco Use: Low Risk  (06/09/2023)     Readmission Risk Interventions    04/17/2023   10:53 AM 04/10/2023    1:41 PM  Readmission Risk Prevention Plan  Transportation Screening Complete Complete  PCP or Specialist Appt within 5-7 Days Complete Complete  Home Care Screening Complete Complete  Medication Review (RN CM) Complete Complete

## 2023-06-11 NOTE — Discharge Summary (Signed)
 Physician Discharge Summary  [name] ZOX:[id number] DOB: [birth year] DOA: 06/08/2023  PCP: Donnie Galea, MD  Admit date: 06/08/2023 Discharge date: 06/11/2023  Admitted From: Home Discharge disposition: Home   Recommendations for Outpatient Follow-Up:   Needs to weigh self daily Appears to have missed cardiology appointment in March so I have rescheduled patient for another appointment in May, needs to follow-up so they can adjust his medications BMP 1 week (increased dose of Lasix )  Discharge Diagnosis:   Principal Problem:   Acute on chronic combined systolic and diastolic heart failure (HCC) Active Problems:   Acute respiratory failure with hypoxia (HCC)   Hypertension associated with diabetes (HCC)   Type 2 diabetes mellitus with vascular disease (HCC)   Hypokalemia   BPH with obstruction/lower urinary tract symptoms   CHF (congestive heart failure) (HCC)    Discharge Condition: Improved.  Diet recommendation: Low sodium, heart healthy.  Carbohydrate-modified.  Wound care: None.  Code status: Full.   History of Present Illness:   [name] is a 89 y.o. male with medical history significant for chronic combined systolic and diastolic CHF, insulin -dependent T2DM, HTN, HLD, BPH, anxiety who presented to the ED for evaluation of hypoxia.   Patient has chronic issues of intermittent tremoring of lower extremities.  This occurred today and EMS were called to the home.  Per patient's son, his SpO2 was 80% on their evaluation and he was placed on 4 L O2 via [place] and brought to the ED for further evaluation.   Patient himself denies having much shortness of breath.  He does report cough occasionally productive of white sputum but feels that this is chronic.  He denies chest pain, fevers, chills, diaphoresis.  He has had increased urine output with use of Lasix  but denies dysuria.  He denies increased swelling to his lower extremities.   He was recently  admitted 2/26-3/7 for acute hypoxic respiratory failure due to acute on chronic CHF and influenza A.  TTE on 2/27 showed reduced EF 35-40% compared to previous 40-45% in 2022.   ED Course  Labs/Imaging on admission: I have personally reviewed following labs and imaging studies.   Initial vitals showed BP 131/99, pulse 86, RR 17, temp 97.1 F, SpO2 94% on 2 L [place].   Labs showed WBC 9.5, hemoglobin 13.2, platelets 268, sodium 136, potassium 3.3, bicarb 24, BUN 15, creatinine 1.15, serum glucose 155, BNP 872.4, troponin 31 > 33, magnesium  1.8, D-dimer 0.47, lactic acid 1.5.   SARS-CoV-2, influenza, RSV PCR negative.  UA showed negative nitrites, small leukocytes, 11-20 RBCs, 21-50 WBCs, few bacteria.  Urine culture in process.  Blood cultures in process.   Formal chest x-ray negative for focal consolidation, edema, effusion.   CT cervical spine without contrast negative for acute cervical spine fracture.  Severe multilevel cervical spondylosis and facet hypertrophy again noted without significant change compared to prior study.   Patient was given IV Lasix  40 mg, IV ceftriaxone , DuoNeb, oral K 40 mEq.  The hospitalist service was consulted to admit.   Hospital Course by Problem:   Acute on chronic combined systolic and diastolic CHF: Patient presented with peripheral edema, shortness of breath, hypoxia prior to arrival. He appears hypervolemic on exam. Recent TTE 2/27 showed EF 35-40%. BNP 872.4.   - Continue Coreg  12.5 mg twice daily. - Continue losartan  25 mg daily. - Continue Jardiance  10 mg daily. - Monitor I/O's, daily weights.-- down 10 lbs   Acute hypoxic respiratory  failure: -weaned to RA   Insulin -dependent type 2 diabetes: -Continue home regimen  Hypertension: Continue Coreg , hydralazine , losartan .   Hypokalemia: Replaced.  Continue to monitor   BPH: Continue Flomax .     Abnormal UA: UA with few bacteria, small leukocytes, negative nitrates, 21-50 WBCs, 11-20 RBCs.    Patient was given IV ceftriaxone  in the ED -culture showing citrobacter   Anxiety: Continue Lexapro .   Chronic neck and bilateral knee pain: -voltaren  gel and PRN robaxin    Chronic intermittent lower extremity tremors: Has been evaluated by neurology, felt to be related to episodes of anxiety as he has seen improvement with Lexapro  and has had normal EEGs.  Continue Lexapro .      Medical Consultants:      Discharge Exam:   Vitals:   06/10/23 2110 06/11/23 0542  BP: (!) 183/101 (!) 151/75  Pulse: 63 86  Resp: 20 18  Temp: 98.3 F (36.8 C) 98.1 F (36.7 C)  SpO2: 90% 92%   Vitals:   06/10/23 1325 06/10/23 2110 06/11/23 0500 06/11/23 0542  BP: (!) 149/69 (!) 183/101  (!) 151/75  Pulse: 73 63  86  Resp: 16 20  18   Temp: 98.7 F (37.1 C) 98.3 F (36.8 C)  98.1 F (36.7 C)  TempSrc: Oral Oral  Oral  SpO2: 90% 90%  92%  Weight:   95.7 kg   Height:        General exam: Appears calm and comfortable.   The results of significant diagnostics from this hospitalization (including imaging, microbiology, ancillary and laboratory) are listed below for reference.     Procedures and Diagnostic Studies:   CT Cervical Spine Wo Contrast Result Date: 06/08/2023 CLINICAL DATA:  Acute neck pain EXAM: CT CERVICAL SPINE WITHOUT CONTRAST TECHNIQUE: Multidetector CT imaging of the cervical spine was performed without intravenous contrast. Multiplanar CT image reconstructions were also generated. RADIATION DOSE REDUCTION: This exam was performed according to the departmental dose-optimization program which includes automated exposure control, adjustment of the mA and/or kV according to patient size and/or use of iterative reconstruction technique. COMPARISON:  05/06/2022 FINDINGS: Alignment: Alignment is grossly anatomic. Skull base and vertebrae: No acute fracture. No primary bone lesion or focal pathologic process. Soft tissues and spinal canal: No prevertebral fluid or swelling. No  visible canal hematoma. Disc levels: Stable severe hypertrophic changes at the C1-C2 interface. Mild diffuse disc space narrowing greatest at C3-4. Bridging anterior osteophytes are seen from C4 through T2. There is diffuse facet hypertrophy, with partial bony fusion across the C2-3 C4-5, and C5-6 facets. Upper chest: Airway is patent.  Lung apices are clear. Other: Reconstructed images demonstrate no additional findings. IMPRESSION: 1. No acute cervical spine fracture. 2. Severe multilevel cervical spondylosis and facet hypertrophy, without significant change since prior study. Electronically Signed   By: Bobbye Burrow M.D.   On: 06/08/2023 17:37   DG Chest Port 1 View Result Date: 06/08/2023 CLINICAL DATA:  Dyspnea. EXAM: PORTABLE CHEST 1 VIEW COMPARISON:  04/08/2023. FINDINGS: Low lung volume. There are probable atelectatic changes at the lung bases. Bilateral lung fields are otherwise clear. No acute consolidation or lung collapse. Bilateral costophrenic angles are clear. Stable cardio-mediastinal silhouette. No acute osseous abnormalities. The soft tissues are within normal limits. IMPRESSION: No active disease. Electronically Signed   By: Beula Brunswick M.D.   On: 06/08/2023 16:17     Labs:   Basic Metabolic Panel: Recent Labs  Lab 06/08/23 1428 06/08/23 1443 06/09/23 0742 06/10/23 0823 06/11/23 0507  NA  136 138 139 136 136  K 3.3* 3.3* 3.4* 3.3* 3.0*  CL 103 102 101 96* 97*  CO2 24  --  26 30 27   GLUCOSE 155* 153* 227* 203* 189*  BUN 15 13 20  25* 26*  CREATININE 1.15 1.20 1.05 1.61* 1.17  CALCIUM  9.4  --  9.7 9.6 9.4  MG 1.8  --  1.7  --   --    GFR Estimated Creatinine Clearance: 54.4 mL/min (by C-G formula based on SCr of 1.17 mg/dL). Liver Function Tests: Recent Labs  Lab 06/08/23 1428  AST 13*  ALT 13  ALKPHOS 61  BILITOT 0.8  PROT 6.5  ALBUMIN 3.6   No results for input(s): "LIPASE", "AMYLASE" in the last 168 hours. No results for input(s): "AMMONIA" in the last  168 hours. Coagulation profile No results for input(s): "INR", "PROTIME" in the last 168 hours.  CBC: Recent Labs  Lab 06/08/23 1428 06/08/23 1443 06/09/23 0742  WBC 9.5  --  7.4  NEUTROABS 7.0  --   --   HGB 13.2 13.6 13.5  HCT 39.9 40.0 43.0  MCV 90.9  --  92.9  PLT 268  --  279   Cardiac Enzymes: No results for input(s): "CKTOTAL", "CKMB", "CKMBINDEX", "TROPONINI" in the last 168 hours. BNP: Invalid input(s): "POCBNP" CBG: Recent Labs  Lab 06/10/23 0736 06/10/23 1146 06/10/23 1632 06/10/23 2146 06/11/23 0735  GLUCAP 196* 205* 158* 205* 248*   D-Dimer Recent Labs    06/08/23 1428  DDIMER 0.47   Hgb A1c No results for input(s): "HGBA1C" in the last 72 hours. Lipid Profile No results for input(s): "CHOL", "HDL", "LDLCALC", "TRIG", "CHOLHDL", "LDLDIRECT" in the last 72 hours. Thyroid  function studies No results for input(s): "TSH", "T4TOTAL", "T3FREE", "THYROIDAB" in the last 72 hours.  Invalid input(s): "FREET3" Anemia work up No results for input(s): "VITAMINB12", "FOLATE", "FERRITIN", "TIBC", "IRON", "RETICCTPCT" in the last 72 hours. Microbiology Recent Results (from the past 240 hours)  Resp panel by RT-PCR (RSV, Flu A&B, Covid) Anterior Nasal Swab     Status: None   Collection Time: 06/08/23  2:02 PM   Specimen: Anterior Nasal Swab  Result Value Ref Range Status   SARS Coronavirus 2 by RT PCR NEGATIVE NEGATIVE Final    Comment: (NOTE) SARS-CoV-2 target nucleic acids are NOT DETECTED.  The SARS-CoV-2 RNA is generally detectable in upper respiratory specimens during the acute phase of infection. The lowest concentration of SARS-CoV-2 viral copies this assay can detect is 138 copies/mL. A negative result does not preclude SARS-Cov-2 infection and should not be used as the sole basis for treatment or other patient management decisions. A negative result may occur with  improper specimen collection/handling, submission of specimen other than  nasopharyngeal swab, presence of viral mutation(s) within the areas targeted by this assay, and inadequate number of viral copies(<138 copies/mL). A negative result must be combined with clinical observations, patient history, and epidemiological information. The expected result is Negative.  Fact Sheet for Patients:  BloggerCourse.com  Fact Sheet for Healthcare Providers:  SeriousBroker.it  This test is no t yet approved or cleared by the [place]  FDA and  has been authorized for detection and/or diagnosis of SARS-CoV-2 by FDA under an Emergency Use Authorization (EUA). This EUA will remain  in effect (meaning this test can be used) for the duration of the COVID-19 declaration under Section 564(b)(1) of the Act, 21 U.S.C.section 360bbb-3(b)(1), unless the authorization is terminated  or revoked sooner.  Influenza A by PCR NEGATIVE NEGATIVE Final   Influenza B by PCR NEGATIVE NEGATIVE Final    Comment: (NOTE) The Xpert Xpress SARS-CoV-2/FLU/RSV plus assay is intended as an aid in the diagnosis of influenza from Nasopharyngeal swab specimens and should not be used as a sole basis for treatment. Nasal washings and aspirates are unacceptable for Xpert Xpress SARS-CoV-2/FLU/RSV testing.  Fact Sheet for Patients: BloggerCourse.com  Fact Sheet for Healthcare Providers: SeriousBroker.it  This test is not yet approved or cleared by the [place]  FDA and has been authorized for detection and/or diagnosis of SARS-CoV-2 by FDA under an Emergency Use Authorization (EUA). This EUA will remain in effect (meaning this test can be used) for the duration of the COVID-19 declaration under Section 564(b)(1) of the Act, 21 U.S.C. section 360bbb-3(b)(1), unless the authorization is terminated or revoked.     Resp Syncytial Virus by PCR NEGATIVE NEGATIVE Final    Comment:  (NOTE) Fact Sheet for Patients: BloggerCourse.com  Fact Sheet for Healthcare Providers: SeriousBroker.it  This test is not yet approved or cleared by the [place]  FDA and has been authorized for detection and/or diagnosis of SARS-CoV-2 by FDA under an Emergency Use Authorization (EUA). This EUA will remain in effect (meaning this test can be used) for the duration of the COVID-19 declaration under Section 564(b)(1) of the Act, 21 U.S.C. section 360bbb-3(b)(1), unless the authorization is terminated or revoked.  Performed at [facility], [address], [place], [place] [address]   Blood culture (routine x 2)     Status: None (Preliminary result)   Collection Time: 06/08/23  2:28 PM   Specimen: BLOOD RIGHT WRIST  Result Value Ref Range Status   Specimen Description   Final    BLOOD RIGHT WRIST Performed at [facility], [address] [place], [place] [address]    Special Requests   Final    BOTTLES DRAWN AEROBIC AND ANAEROBIC Blood Culture adequate volume Performed at [facility], [address] [place], [place] [address]    Culture   Final    NO GROWTH 3 DAYS Performed at [facility], [address], [place], [place] [address]    Report Status PENDING  Incomplete  Urine Culture     Status: Abnormal   Collection Time: 06/08/23  2:48 PM   Specimen: Urine, Clean Catch  Result Value Ref Range Status   Specimen Description   Final    URINE, CLEAN CATCH Performed at [facility], [address] [place], [place] [address]    Special Requests   Final    NONE Performed at [facility], [address], [place], [place] [address]    Culture >=100,000 COLONIES/mL CITROBACTER SPECIES (A)  Final   Report Status 06/10/2023 FINAL  Final   Organism ID, Bacteria CITROBACTER SPECIES (A)  Final      Susceptibility   Citrobacter species -  MIC*    CEFEPIME <=0.12 SENSITIVE Sensitive     CEFTRIAXONE  <=0.25 SENSITIVE Sensitive     CIPROFLOXACIN  0.5 INTERMEDIATE Intermediate     GENTAMICIN <=1 SENSITIVE Sensitive     IMIPENEM 2 SENSITIVE Sensitive     NITROFURANTOIN 32 SENSITIVE Sensitive     TRIMETH /SULFA  <=20 SENSITIVE Sensitive     PIP/TAZO <=4 SENSITIVE Sensitive ug/mL    * >=100,000 COLONIES/mL CITROBACTER SPECIES  Blood culture (routine x 2)     Status: None (Preliminary result)   Collection Time: 06/08/23  4:23  PM   Specimen: BLOOD RIGHT FOREARM  Result Value Ref Range Status   Specimen Description   Final    BLOOD RIGHT FOREARM Performed at [facility], [address], [place], [place] [address]    Special Requests   Final    BOTTLES DRAWN AEROBIC AND ANAEROBIC Blood Culture results may not be optimal due to an inadequate volume of blood received in culture bottles Performed at [facility], [address] [place], [place] [address]    Culture   Final    NO GROWTH 3 DAYS Performed at [facility], [address], [place], [place] [address]    Report Status PENDING  Incomplete     Discharge Instructions:   Discharge Instructions     (HEART FAILURE PATIENTS) Call MD:  Anytime you have any of the following symptoms: 1) 3 pound weight gain in 24 hours or 5 pounds in 1 week 2) shortness of breath, with or without a dry hacking cough 3) swelling in the hands, feet or stomach 4) if you have to sleep on extra pillows at night in order to breathe.   Complete by: As directed    Diet - low sodium heart healthy   Complete by: As directed    Diet Carb Modified   Complete by: As directed    Heart Failure patients record your daily weight using the same scale at the same time of day   Complete by: As directed    Increase activity slowly   Complete by: As directed    STOP any activity that causes chest pain, shortness of breath, dizziness, sweating, or exessive weakness   Complete by:  As directed       Allergies as of 06/11/2023       Reactions   Morphine  Nausea Only, Other (See Comments), Nausea And Vomiting   Can take with food and usually doesn't cause nausea   Atorvastatin     Intolerant - nausea/myalgias   Codeine Phosphate Nausea And Vomiting   Metformin  And Related Diarrhea        Medication List     TAKE these medications    Accu-Chek Aviva Plus w/Device Kit Check sugar up to 3 times daily. E11.59  Insulin  dependent   Accu-Chek Aviva Soln Use as directed monthly. E11.59  Insulin  dependent   Accu-Chek Softclix Lancets lancets TEST BLOOD SUGAR UP TO THREE TIMES DAILY   aspirin  EC 81 MG tablet Take 81 mg by mouth every evening.   B-D 3CC LUER-LOK SYR 25GX1" 25G X 1" 3 ML Misc Generic drug: SYRINGE-NEEDLE (DISP) 3 ML USE TO INJECT VITAMIN B12 MONTHLY.   carvedilol  12.5 MG tablet Commonly known as: COREG  TAKE 1 TABLET BY MOUTH TWICE  DAILY WITH MEALS   COD LIVER OIL PO Take 1 capsule by mouth 2 (two) times daily.   cyanocobalamin  1000 MCG/ML injection Commonly known as: VITAMIN B12 Inject 1 mL (1,000 mcg total) into the muscle every 30 (thirty) days. Dispense 3 of the 1 mL vials.   diclofenac  Sodium 1 % Gel Commonly known as: VOLTAREN  Apply 2 g topically 4 (four) times daily.   Droplet Pen Needles 30G X 8 MM Misc Generic drug: Insulin  Pen Needle USE  WITH  INSULIN   PEN   empagliflozin  10 MG Tabs tablet Commonly known as: JARDIANCE  Take 10 mg by mouth daily.   escitalopram  20 MG tablet Commonly known as: Lexapro  Take 1 tablet (20 mg total) by mouth daily.  FreeStyle Libre 3 Reader [place] 1 each by Does not apply route as directed.   FreeStyle Libre 3 Sensor Misc Place 1 sensor on the skin every 14 days. Use to check glucose continuously   furosemide  40 MG tablet Commonly known as: LASIX  Take 1 tablet (40 mg total) by mouth daily. Start taking on: Jun 12, 2023 What changed:  medication strength how much to take    hydrALAZINE  50 MG tablet Commonly known as: APRESOLINE  TAKE 1 TABLET BY MOUTH 3 TIMES  DAILY   insulin  lispro 100 UNIT/ML KwikPen Commonly known as: HumaLOG  KwikPen INJECT SUBCUTANEOUSLY 4-10 UNITS 3 TIMES DAILY BEFORE MEALS PER  SLIDING SCALE: 151-200 = 4 UN,  201-250 = 6 UN, 251-300 = 8 UN,  AND OVER 300 = 10 UN   losartan  25 MG tablet Commonly known as: Cozaar  Take 1 tablet (25 mg total) by mouth daily.   meclizine  12.5 MG tablet Commonly known as: ANTIVERT  Take 1 tablet (12.5 mg total) by mouth 3 (three) times daily as needed for dizziness.   naproxen  sodium 220 MG tablet Commonly known as: ALEVE  Take 220 mg by mouth daily as needed.   ondansetron  4 MG tablet Commonly known as: ZOFRAN  Take 1 tablet (4 mg total) by mouth every 6 (six) hours.   OneTouch Ultra test strip Generic drug: glucose blood USE TO CHECK UP TO 3 TIMES DAILY AS NEEDED   potassium chloride  SA 20 MEQ tablet Commonly known as: KLOR-CON  M Take 2 tablets (40 mEq total) by mouth daily. Start taking on: Jun 12, 2023 What changed:  medication strength how much to take   tamsulosin  0.4 MG Caps capsule Commonly known as: FLOMAX  TAKE 1 CAPSULE BY MOUTH DAILY  AFTER SUPPER   [name]  SoloStar 300 UNIT/ML Solostar Pen Generic drug: insulin  glargine (1 Unit Dial ) Inject 20 Units into the skin at bedtime. INJECT SUBCUTANEOUSLY 16  UNITS AT BEDTIME   traMADol  50 MG tablet Commonly known as: ULTRAM  TAKE 1 TABLET BY MOUTH EVERY 12 HOURS AS NEEDED FOR KNEE PAIN.          Time coordinating discharge: 45 min  Signed:  Enrigue Harvard DO  [facility] 06/11/2023, 10:57 AM

## 2023-06-11 NOTE — Plan of Care (Signed)

## 2023-06-11 NOTE — Discharge Instructions (Signed)
 Cariology appointment 5/13

## 2023-06-12 ENCOUNTER — Telehealth: Payer: Self-pay

## 2023-06-12 NOTE — Transitions of Care (Post Inpatient/ED Visit) (Signed)
   06/12/2023  Name: [name] MRN: [id number] DOB: 06-16-1934  Today's TOC FU Call Status: Today's TOC FU Call Status:: Unsuccessful Call (1st Attempt) Unsuccessful Call (1st Attempt) Date: 06/12/23  Attempted to reach the patient regarding the most recent Inpatient/ED visit. Left a HIPAA appropriate voice mail requesting a return call.  Follow Up Plan: Additional outreach attempts will be made to reach the patient to complete the Transitions of Care (Post Inpatient/ED visit) call.   [place], RN, BSN, CCM [facility], [facility] RN Care Manager Direct Dial : [phone]

## 2023-06-13 LAB — CULTURE, BLOOD (ROUTINE X 2)
Culture: NO GROWTH
Culture: NO GROWTH
Special Requests: ADEQUATE

## 2023-06-15 ENCOUNTER — Telehealth: Payer: Self-pay | Admitting: *Deleted

## 2023-06-15 NOTE — Transitions of Care (Post Inpatient/ED Visit) (Signed)
   06/15/2023  Name: KELLEE KNEECE MRN: [id number] DOB: August 23, 1934  Today's TOC FU Call Status: Today's TOC FU Call Status:: Unsuccessful Call (2nd Attempt) Unsuccessful Call (2nd Attempt) Date: 06/15/23  Attempted to reach the patient regarding the most recent Inpatient visit; left HIPAA compliant voice message requesting call back  Follow Up Plan: Additional outreach attempts will be made to reach the patient to complete the Transitions of Care (Post Inpatient visit) call.   Pls call/ message for questions,  Chandan Fly Mckinney Tyleah Loh, RN, BSN, CCRN Alumnus RN Care Manager  Transitions of Care  [facility] [phone]: direct office

## 2023-06-16 ENCOUNTER — Ambulatory Visit: Admitting: Family Medicine

## 2023-06-16 ENCOUNTER — Telehealth: Payer: Self-pay

## 2023-06-16 ENCOUNTER — Encounter: Payer: Self-pay | Admitting: Family Medicine

## 2023-06-16 VITALS — BP 154/76 | HR 64 | Temp 97.9°F | Ht 72.0 in | Wt 223.2 lb

## 2023-06-16 DIAGNOSIS — E1159 Type 2 diabetes mellitus with other circulatory complications: Secondary | ICD-10-CM

## 2023-06-16 DIAGNOSIS — I5043 Acute on chronic combined systolic (congestive) and diastolic (congestive) heart failure: Secondary | ICD-10-CM

## 2023-06-16 DIAGNOSIS — F445 Conversion disorder with seizures or convulsions: Secondary | ICD-10-CM | POA: Diagnosis not present

## 2023-06-16 DIAGNOSIS — Z794 Long term (current) use of insulin: Secondary | ICD-10-CM

## 2023-06-16 LAB — BASIC METABOLIC PANEL WITH GFR
BUN: 15 mg/dL (ref 6–23)
CO2: 29 meq/L (ref 19–32)
Calcium: 9.5 mg/dL (ref 8.4–10.5)
Chloride: 103 meq/L (ref 96–112)
Creatinine, Ser: 1.24 mg/dL (ref 0.40–1.50)
GFR: 52.66 mL/min — ABNORMAL LOW (ref >60.00)
Glucose, Bld: 146 mg/dL — ABNORMAL HIGH (ref 70–99)
Potassium: 4.1 meq/L (ref 3.5–5.1)
Sodium: 139 meq/L (ref 135–145)

## 2023-06-16 LAB — HEMOGLOBIN A1C: Hgb A1c MFr Bld: 8.7 % — ABNORMAL HIGH (ref 4.6–6.5)

## 2023-06-16 NOTE — Transitions of Care (Post Inpatient/ED Visit) (Addendum)
 06/16/2023  Name: [name] MRN: [id number] DOB: [birth year]  Today's TOC FU Call Status: Today's TOC FU Call Status:: Successful TOC FU Call Completed TOC FU Call Complete Date: 06/16/23 Patient's Name and Date of Birth confirmed.  Transition Care Management Follow-up Telephone Call Date of Discharge: 06/11/23 Discharge Facility: [facility] Type of Discharge: Inpatient Admission Primary Inpatient Discharge Diagnosis:: Congestive HF How have you been since you were released from the hospital?: Same ("I'm ok saw the doctor this morning) Any questions or concerns?: No  Items Reviewed: Did you receive and understand the discharge instructions provided?: Yes ("yes pretty much, my cardiologist to adjust my medicines) Medications obtained,verified, and reconciled?: Yes (Medications Reviewed) ("Just had them check at my primary") Any new allergies since your discharge?: No Dietary orders reviewed?: No Do you have support at home?: Yes  Medications Reviewed Today: Medications Reviewed Today     Reviewed by Jamie Mccoy, RN (Registered Nurse) on 06/16/23 at 1444  Med List Status: <None>   Medication Order Taking? Sig Documenting Provider Last Dose Status Informant  Accu-Chek Softclix Lancets lancets [id number] Yes TEST BLOOD SUGAR UP TO THREE TIMES DAILY Donnie Galea, MD Taking Active Self, Pharmacy Records  amLODipine  (NORVASC ) 10 MG tablet [id number] Yes Take 10 mg by mouth daily. [provider] Taking Active   aspirin  EC 81 MG tablet [id number] Yes Take 81 mg by mouth every evening. [provider] Taking Active Self, Pharmacy Records  Blood Glucose Calibration (ACCU-CHEK AVIVA) SOLN [id number] Yes Use as directed monthly. E11.59  Insulin  dependent Donnie Galea, MD Taking Active Self, Pharmacy Records  Blood Glucose Monitoring Suppl (ACCU-CHEK AVIVA PLUS) w/Device KIT [id number] Yes Check sugar up to 3 times daily. E11.59  Insulin  dependent Donnie Galea, MD Taking Active Self, Pharmacy Records  carvedilol  (COREG ) 12.5 MG tablet [id number] Yes TAKE 1 TABLET BY MOUTH TWICE  DAILY WITH MEALS Donnie Galea, MD Taking Active Self, Pharmacy Records  COD LIVER OIL PO [id number] Yes Take 1 capsule by mouth 2 (two) times daily.  [provider] Taking Active Self, Pharmacy Records  Continuous Glucose Receiver (FREESTYLE LIBRE 3 READER) DEVI [id number]  1 each by Does not apply route as directed. Donnie Galea, MD  Active Self, Pharmacy Records  Continuous Glucose Sensor (FREESTYLE LIBRE 3 [place]) [place] [id number] Yes Place 1 sensor on the skin every 14 days. Use to check glucose continuously Donnie Galea, MD Taking Active Self, Pharmacy Records  cyanocobalamin  (,VITAMIN B-12,) 1000 MCG/ML injection [id number] Yes Inject 1 mL (1,000 mcg total) into the muscle every 30 (thirty) days. Dispense 3 of the 1 mL vials. Donnie Galea, MD Taking Active Self, Pharmacy Records           Med Note Vallarie Gauze, Zackery Herring Mar 24, 2023 12:27 PM)    diclofenac  Sodium (VOLTAREN ) 1 % GEL [id number] Yes Apply 2 g topically 4 (four) times daily. Vann, Jessica U, DO Taking Active   DROPLET PEN NEEDLES 30G X 8 MM MISC [id number] Yes USE  WITH  INSULIN   PEN Donnie Galea, MD Taking Active Self, Pharmacy Records  empagliflozin  (JARDIANCE ) 10 MG TABS tablet [id number] No Take 10 mg by mouth daily.  Patient not taking: Reported on 06/16/2023   [provider] Not Taking Active Self, Pharmacy Records           Med Note Alray Jenny Jun 16, 2023 11:23 AM)    escitalopram  (  LEXAPRO ) 20 MG tablet [id number] Yes Take 1 tablet (20 mg total) by mouth daily. Donnie Galea, MD Taking Active Self, Pharmacy Records  furosemide  (LASIX ) 40 MG tablet [id number] Yes Take 1 tablet (40 mg total) by mouth daily. Vann, Jessica U, DO Taking Active   hydrALAZINE  (APRESOLINE ) 50 MG tablet [id number] Yes TAKE 1 TABLET BY MOUTH 3 TIMES  DAILY Donnie Galea, MD  Taking Active Self, Pharmacy Records  insulin  glargine, 1 Unit Dial , ([name]  SOLOSTAR) 300 UNIT/ML Solostar Pen [id number] Yes Inject 20 Units into the skin at bedtime. INJECT SUBCUTANEOUSLY 16  UNITS AT BEDTIME Modena Andes, MD Taking Active Self, Pharmacy Records  insulin  lispro (HUMALOG  KWIKPEN) 100 UNIT/ML KwikPen [id number] Yes INJECT SUBCUTANEOUSLY 4-10 UNITS 3 TIMES DAILY BEFORE MEALS PER  SLIDING SCALE: 151-200 = 4 UN,  201-250 = 6 UN, 251-300 = 8 UN,  AND OVER 300 = 10 UN Donnie Galea, MD Taking Active Self, Pharmacy Records           Med Note Vallarie Gauze, Gwynda Leriche   Tue Jun 16, 2023 11:24 AM)    losartan  (COZAAR ) 25 MG tablet [id number]  Take 1 tablet (25 mg total) by mouth daily. Modena Andes, MD  Active Self, Pharmacy Records  meclizine  (ANTIVERT ) 12.5 MG tablet [id number] Yes Take 1 tablet (12.5 mg total) by mouth 3 (three) times daily as needed for dizziness. Donnie Galea, MD Taking Active Self, Pharmacy Records  naproxen  sodium (ALEVE ) 220 MG tablet [id number] Yes Take 220 mg by mouth daily as needed. [provider] Taking Active Self, Pharmacy Records  ondansetron  (ZOFRAN ) 4 MG tablet [id number]  Take 1 tablet (4 mg total) by mouth every 6 (six) hours. Kingsley, Landan Fedie K, DO  Active Self, Pharmacy Records  [facility] ULTRA test strip [id number] Yes USE TO CHECK UP TO 3 TIMES DAILY AS NEEDED Donnie Galea, MD Taking Active Self, Pharmacy Records  potassium chloride  SA (KLOR-CON  M) 20 MEQ tablet [id number] Yes Take 2 tablets (40 mEq total) by mouth daily. Vann, Jessica U, DO Taking Active   SYRINGE-NEEDLE, DISP, 3 ML (B-D 3CC LUER-LOK SYR 25GX1") 25G X 1" 3 ML MISC [id number] Yes USE TO INJECT VITAMIN B12 MONTHLY. Donnie Galea, MD Taking Active Self, Pharmacy Records  tamsulosin  (FLOMAX ) 0.4 MG CAPS capsule [id number] Yes TAKE 1 CAPSULE BY MOUTH DAILY  AFTER SUPPER Donnie Galea, MD Taking Active Self, Pharmacy Records  traMADol  (ULTRAM ) 50 MG tablet [id number] Yes TAKE 1  TABLET BY MOUTH EVERY 12 HOURS AS NEEDED FOR KNEE PAIN. Donnie Galea, MD Taking Active Self, Pharmacy Records              SDOH Interventions Today    Flowsheet Row Most Recent Value  SDOH Interventions   Food Insecurity Interventions Intervention Not Indicated  Housing Interventions Intervention Not Indicated  Transportation Interventions Intervention Not Indicated  Utilities Interventions Intervention Not Indicated       Goals Addressed             This Visit's Progress    VBCI Transitions of Care (TOC) Care Plan       Problems:  Recent Hospitalization for treatment of CHF Medication access barrier  patient not taking Jardiance  cost too much noted per MD not patient just states "I'm not taking that" Encourage patient to have son, Bambi Lever follow up with pharmacist.  Goal:  Over the next 30 days, the patient will not experience hospital readmission  Interventions:   Heart Failure Interventions: Discussed  importance of daily weight and advised patient to weigh and record daily Discussed the importance of keeping all appointments with provider  follow up with cardiologist 06/23/23 1445  Patient Self Care Activities:  Attend all scheduled provider appointments Call provider office for new concerns or questions     Plan:  Telephone follow up appointment with care management team member scheduled for:  06/23/23 at 1300         [place], Charity fundraiser, Scientist, research (physical sciences), CCM [employer], [facility] RN Care Manager Direct Dial : [phone]

## 2023-06-16 NOTE — Patient Instructions (Signed)
 Go to the lab on the way out.   If you have mychart we'll likely use that to update you.    Take care.  Glad to see you. Keep taking furosemide  and potassium as is.  I would keep the cardiology appointment.

## 2023-06-16 NOTE — Patient Instructions (Signed)
 Visit Information  Thank you for taking time to visit with me today. Please don't hesitate to contact me if I can be of assistance to you before our next scheduled appointment.  Your next care management appointment is by telephone on Jun 23, 2023 at 1300.  Telephone follow-up in 1 week  Please call the care guide team at [phone] if you need to cancel, schedule, or reschedule an appointment.   Please call the [place]  National Suicide Prevention Lifeline: 331-455-6393 or TTY: 682-722-3211 TTY [phone]) to talk to a trained counselor if you are experiencing a Mental Health or Behavioral Health Crisis or need someone to talk to.  [place], RN, BSN, CCM [facility], [facility] RN Care Manager Direct Dial : [phone]

## 2023-06-16 NOTE — Progress Notes (Unsigned)
 Inpatient follow-up.  Inpatient course discussed with patient.   Admit date: 06/08/2023 Discharge date: 06/11/2023   Admitted From: Home Discharge disposition: Home     Recommendations for Outpatient Follow-Up:    Needs to weigh self daily Appears to have missed cardiology appointment in March so I have rescheduled patient for another appointment in May, needs to follow-up so they can adjust his medications BMP 1 week (increased dose of Lasix )   Discharge Diagnosis:    Principal Problem:   Acute on chronic combined systolic and diastolic heart failure (HCC) Active Problems:   Acute respiratory failure with hypoxia (HCC)   Hypertension associated with diabetes (HCC)   Type 2 diabetes mellitus with vascular disease (HCC)   Hypokalemia   BPH with obstruction/lower urinary tract symptoms   CHF (congestive heart failure) (HCC)   Acute on chronic combined systolic and diastolic CHF: Patient presented with peripheral edema, shortness of breath, hypoxia prior to arrival. He appears hypervolemic on exam. Recent TTE 2/27 showed EF 35-40%. BNP 872.4.   - Continue Coreg  12.5 mg twice daily. - Continue losartan  25 mg daily. - Continue Jardiance  10 mg daily. - Monitor I/O's, daily weights.-- down 10 lbs   Acute hypoxic respiratory failure: -weaned to RA   Insulin -dependent type 2 diabetes: -Continue home regimen   Hypertension: Continue Coreg , hydralazine , losartan .   Hypokalemia: Replaced.  Continue to monitor   BPH: Continue Flomax .     Abnormal UA: UA with few bacteria, small leukocytes, negative nitrates, 21-50 WBCs, 11-20 RBCs.   Patient was given IV ceftriaxone  in the ED -culture showing citrobacter   Anxiety: Continue Lexapro .   Chronic neck and bilateral knee pain: -voltaren  gel and PRN robaxin    Chronic intermittent lower extremity tremors: Has been evaluated by neurology, felt to be related to episodes of anxiety as he has seen improvement with Lexapro  and  has had normal EEGs.  Continue Lexapro .    ======================= Admitted with CHF and required diuresis and was weaned to room air.  Improved and became stable for discharge.  Disc has chronic anxiety.  Continues with Lexapro  in the meantime.  He is not short of breath now.  Discussed Jardiance  use.  He is off jardiance  in the meantime.  It was too expensive.  He needs pharmacy help on that.  Referral placed. Still on toujeo  20 units with SSI.    On lasix  with potassium.  D/w pt about cards f/u.  No BP.  Not SOB.  Not SOB supine.    DM2.  Sugar usually 170-200 in the AM.  A1c pending.  Still on insulin .  Not having low sugars.    BLE edema resolved.   Meds, vitals, and allergies reviewed.   ROS: Per HPI unless specifically indicated in ROS section   GEN: nad, alert and oriented HEENT: mucous membranes moist NECK: supple w/o LA CV: rrr. PULM: ctab, no inc wob ABD: soft, +bs EXT: no edema SKIN: Well-perfused

## 2023-06-17 NOTE — Assessment & Plan Note (Signed)
 Will ask pharmacy help with Jardiance  cost.  Encourage cardiology follow-up.  Advised to check his weight and continue with Lasix /potassium as is.  See notes on labs. Continue amlodipine  carvedilol  hydralazine  losartan 

## 2023-06-17 NOTE — Assessment & Plan Note (Signed)
 Will ask for pharmacy help with Jardiance .  Continue insulin  in the meantime.  Recheck periodically.

## 2023-06-17 NOTE — Assessment & Plan Note (Signed)
 Would continue Lexapro  for anxiety.

## 2023-06-18 ENCOUNTER — Other Ambulatory Visit: Payer: Self-pay | Admitting: Family Medicine

## 2023-06-18 ENCOUNTER — Telehealth: Payer: Self-pay

## 2023-06-18 MED ORDER — TOUJEO SOLOSTAR 300 UNIT/ML ~~LOC~~ SOPN: 20.0000 [IU] | PEN_INJECTOR | Freq: Every day | SUBCUTANEOUS | Status: DC

## 2023-06-18 NOTE — Progress Notes (Signed)
 Care Guide Pharmacy Note  06/18/2023 Name: JERRITT OBERG MRN: [id number] DOB: 28-Dec-1934  Referred By: Donnie Galea, MD Reason for referral: Complex Care Management (Outreach to schedule with Pharm d )   STEPAN BRODERSON is a 89 y.o. year old male who is a primary care patient of Donnie Galea, MD.  [name] was referred to the pharmacist for assistance related to: DMII  An unsuccessful telephone outreach was attempted today to contact the patient who was referred to the pharmacy team for assistance with medication assistance. Additional attempts will be made to contact the patient.  Lenton Rail , RMA     [facility]  [facility], [facility] Guide  Direct Dial : [phone]  Website: [email or web address]

## 2023-06-22 NOTE — Progress Notes (Unsigned)
 Cardiology Office Note    Patient Name: [name] Date of Encounter: 06/22/2023  Primary Care Provider:  Donnie Galea, MD Primary Cardiologist:  Dorothye Gathers, MD Primary Electrophysiologist: None   Past Medical History    Past Medical History:  Diagnosis Date   Anxiety    Borderline hyperlipidemia    CAD (coronary artery disease)    Colon polyps    Complication of anesthesia    trouble urinating after anesthesia   Diverticulosis of colon    DJD (degenerative joint disease)    DM (diabetes mellitus) (HCC)    Adult onset  type 2   History of gastritis    History of kidney stones    surgery to remove   History of pyelonephritis    History of syncope    HTN (hypertension)    Hypertrophy of prostate with urinary obstruction and other lower urinary tract symptoms (LUTS)    Melanoma (HCC)    back of neck   Melanoma (HCC) 2022   per dermatology   Myoclonus    Pancreatitis due to biliary obstruction several years ago   treated by Dr. Joshua Nieves    History of Present Illness  [name] is a 89 y.o. male with a PMH of HFrEF, NICM, DM type II, HTN, startle myoclonus HLD, BPH, anxiety who presents today for posthospital follow-up.  [name] was seen in 2010 for syncopal episode with 2D echo completed showing EF of 50-55% and LHC completed on 07/2002 showing nonobstructive CAD.  He was admitted 01/04/2014 with dizziness and presyncope.  He was seen initially by Dr. Renna Cary on 03/15/2014 for evaluation and follow-up.  He was admitted 04/01/2020 for complaint of chest pain.  He described midsternal chest pain with nausea.  EKG was completed showing sinus rhythm with frequent PACs and troponins were mildly elevated.  Blood pressure was also found to be elevated on arrival 1 160s over 90s.  He had previously had surgical excision of a left arm melanoma with biopsy of lymph nodes few days prior.  He underwent 2D echo on 04/02/2020 that showed EF of 40-45% with grade 1 DD and  normal RV function with normal PA systolic pressure.  He was discharged on lisinopril  HCTZ amlodipine  and carvedilol  along with hydralazine .   He was seen next on 04/10/2023 with complaints of flulike symptoms and generalized weakness.  O2 sats were 88% on room air and patient tested positive for influenza A.  BNP was elevated at 618 and troponins were bumped at 32-37.  Chest x-ray was completed and concerning for pneumonia versus CHF.  2D echo was completed that showed reduced EF of 35 as 40% with global hypokinesis and grade 2 DD with normal RV function.  He was treated with IV Lasix  and spironolactone  and Jardiance  were held due to AKI.  He was seen on 06/08/2023 with complaint of tremors and concern for sepsis.  He was noted to be 80% on room air at home and was placed on 4 L nasal cannula and brought to the ED.  He denies shortness of breath but reported occasional coughing productive of white sputum.  BP was elevated 131/99 with BNP 872.4, troponin 31 > 33.  Chest x-ray was also completed and was negative for consolidation, edema or effusion.  He was given IV Lasix  with IV ceftriaxone  and DuoNebs he was discharged following treatment and seen by his PCP on 06/16/2023 and had missed his follow-up scheduled in March following hospitalization.  [name]  presents today with his son for posthospital follow-up.  During today's visit he reported doing well with no new cardiac complaints.  He reports compliance with his current medication regimen and denies any adverse reactions.  His blood pressure today was stable at 122/50 and heart rate was 62 bpm.  He also reports stable weight since being discharged from the hospital.  He has been compliant with his current diet and has abstain from excess salt.  He reports neck stiffness and pain which is not new for him.  He has previously been followed by different orthopedics.  He is also planning to begin PT/OT at home.  During today's visit we discussed the importance of  daily weights and also he was advised to take all of his medications as prescribed. Patient denies chest pain, palpitations, dyspnea, PND, orthopnea, nausea, vomiting, dizziness, syncope, edema, weight gain, or early satiety.  Discussed the use of AI scribe software for clinical note transcription with the patient, who gave verbal consent to proceed.  History of Present Illness   Review of Systems  Please see the history of present illness.    All other systems reviewed and are otherwise negative except as noted above.  Physical Exam     Wt Readings from Last 3 Encounters:  06/16/23 223 lb (101.2 kg)  06/16/23 223 lb 3.2 oz (101.2 kg)  06/11/23 210 lb 15.7 oz (95.7 kg)   WJ:XBJYN were no vitals filed for this visit.,There is no height or weight on file to calculate BMI. GEN: Well nourished, well developed in no acute distress Neck: No JVD; No carotid bruits Pulmonary: Clear to auscultation without rales, wheezing or rhonchi  Cardiovascular: Normal rate. Regular rhythm. Normal S1. Normal S2.   Murmurs: There is no murmur.  ABDOMEN: Soft, non-tender, non-distended EXTREMITIES: Trace bilateral lower extremity edema  EKG/LABS/ Recent Cardiac Studies   ECG personally reviewed by me today -none completed today  Risk Assessment/Calculations:          Lab Results  Component Value Date   WBC 7.4 06/09/2023   HGB 13.5 06/09/2023   HCT 43.0 06/09/2023   MCV 92.9 06/09/2023   PLT 279 06/09/2023   Lab Results  Component Value Date   CREATININE 1.24 06/16/2023   BUN 15 06/16/2023   NA 139 06/16/2023   K 4.1 06/16/2023   CL 103 06/16/2023   CO2 29 06/16/2023   Lab Results  Component Value Date   CHOL 162 07/23/2022   HDL 32.70 (L) 07/23/2022   LDLCALC 88 01/27/2022   LDLDIRECT 103.0 07/23/2022   TRIG 284.0 (H) 07/23/2022   CHOLHDL 5 07/23/2022    Lab Results  Component Value Date   HGBA1C 8.7 (H) 06/16/2023   Assessment & Plan    Assessment & Plan   1.  Chronic  combined CHF: - 2D echo completed 04/09/2023 showing EF of 30-40% global hypokinesis and mildly dilated LV and grade 2 DD - Patient is euvolemic on examination today and reports compliance with low-sodium diet - Continue current GDMT with carvedilol  12.5 mg twice daily, Lasix  40 mg daily with as needed 20 mg, Jardiance  10 mg daily hydralazine  50 mg 3 times daily, losartan  25 mg, potassium 20 mEq -Low sodium diet, fluid restriction <2L, and daily weights encouraged. Educated to contact our office for weight gain of 2 lbs overnight or 5 lbs in one week.   2.  Essential hypertension: - Patient's blood pressure today was stable at 122/50 - Continue hydralazine  50 mg 3  times daily, Norvasc  10 mg daily, carvedilol  12.5 mg twice daily  3.  NICM: -2D echo completed 04/09/2023 showing EF of 30-40% global hypokinesis and mildly dilated LV and grade 2 DD - Continue guideline directed therapy as noted above  4.  DM type II: - Hemoglobin A1c was elevated at 8.7 - Continue Jardiance  10 mg, [name]  and Humalog  as prescribed by PCP  Disposition: Follow-up with Dorothye Gathers, MD or APP in 6 months    Signed, Francene Ing, Retha Cast, NP 06/22/2023, 12:25 PM [facility]

## 2023-06-23 ENCOUNTER — Encounter: Payer: Self-pay | Admitting: Nurse Practitioner

## 2023-06-23 ENCOUNTER — Ambulatory Visit: Attending: Nurse Practitioner | Admitting: Nurse Practitioner

## 2023-06-23 ENCOUNTER — Other Ambulatory Visit: Payer: Self-pay | Admitting: Family Medicine

## 2023-06-23 ENCOUNTER — Telehealth: Payer: Self-pay

## 2023-06-23 VITALS — BP 122/50 | HR 62 | Ht 72.0 in | Wt 224.0 lb

## 2023-06-23 DIAGNOSIS — I10 Essential (primary) hypertension: Secondary | ICD-10-CM | POA: Diagnosis not present

## 2023-06-23 DIAGNOSIS — I5042 Chronic combined systolic (congestive) and diastolic (congestive) heart failure: Secondary | ICD-10-CM | POA: Diagnosis not present

## 2023-06-23 DIAGNOSIS — E1159 Type 2 diabetes mellitus with other circulatory complications: Secondary | ICD-10-CM

## 2023-06-23 DIAGNOSIS — I428 Other cardiomyopathies: Secondary | ICD-10-CM

## 2023-06-23 DIAGNOSIS — R42 Dizziness and giddiness: Secondary | ICD-10-CM

## 2023-06-23 MED ORDER — FUROSEMIDE 40 MG PO TABS: 40.0000 mg | ORAL_TABLET | Freq: Every day | ORAL | 1 refills | Status: DC

## 2023-06-23 NOTE — Progress Notes (Signed)
 Care Guide Pharmacy Note  06/23/2023 Name: [name] MRN: [id number] DOB: 10-08-1934  Referred By: Donnie Galea, MD Reason for referral: Complex Care Management (Outreach to schedule with Pharm d )   [name] is a 89 y.o. year old male who is a primary care patient of Donnie Galea, MD.  [name] was referred to the pharmacist for assistance related to: DMII  A second unsuccessful telephone outreach was attempted today to contact the patient who was referred to the pharmacy team for assistance with medication assistance. Additional attempts will be made to contact the patient.  Lenton Rail , RMA     [facility]  [facility], [facility] Guide  Direct Dial : [phone]  Website: [email or web address]

## 2023-06-23 NOTE — Patient Instructions (Signed)
 Medication Instructions:  Can take an additional 20mg  of Lasix  if you gain more than 2 lbs in a day or 5lbs in a week. *If you need a refill on your cardiac medications before your next appointment, please call your pharmacy*  Lab Work: None ordered If you have labs (blood work) drawn today and your tests are completely normal, you will receive your results only by: MyChart Message (if you have MyChart) OR A paper copy in the mail If you have any lab test that is abnormal or we need to change your treatment, we will call you to review the results.  Testing/Procedures: None ordered  Follow-Up: At [facility], you and your health needs are our priority.  As part of our continuing mission to provide you with exceptional heart care, our providers are all part of one team.  This team includes your primary Cardiologist (physician) and Advanced Practice Providers or APPs (Physician Assistants and Nurse Practitioners) who all work together to provide you with the care you need, when you need it.  Your next appointment:   3 month(s)  Provider:   Dorothye Gathers, MD or Charles Connor, NP   We recommend signing up for the patient portal called "MyChart".  Sign up information is provided on this After Visit Summary.  MyChart is used to connect with patients for Virtual Visits (Telemedicine).  Patients are able to view lab/test results, encounter notes, upcoming appointments, etc.  Non-urgent messages can be sent to your provider as well.   To learn more about what you can do with MyChart, go to [email or web address].   Other Instructions

## 2023-06-24 ENCOUNTER — Ambulatory Visit: Payer: Self-pay

## 2023-06-24 NOTE — Telephone Encounter (Signed)
 Copied from CRM [id number]. Topic: Clinical - Medication Question >> Jun 23, 2023 12:58 PM Caliyah H wrote: Reason for CRM: Patient's son called regarding a refill request for Meclizine . He stated that the pharmacy has already sent a request and wanted to inform the provider that the patient has 4 tablets remaining before running out.

## 2023-06-24 NOTE — Telephone Encounter (Signed)
 Sent. Thanks.

## 2023-07-01 NOTE — Progress Notes (Signed)
 Care Guide Pharmacy Note  07/01/2023 Name: [name] MRN: [id number] DOB: 04-19-1934  Referred By: Donnie Galea, MD Reason for referral: Complex Care Management (Outreach to schedule with Pharm d )   [name] is a 89 y.o. year old male who is a primary care patient of Donnie Galea, MD.  [name] was referred to the pharmacist for assistance related to: DMII  Successful contact was made with the patient to discuss pharmacy services including being ready for the pharmacist to call at least 5 minutes before the scheduled appointment time and to have medication bottles and any blood pressure readings ready for review. The patient agreed to meet with the pharmacist via telephone visit on (date/time).07/02/2023  Lenton Rail , RMA     [place]  [facility], [facility] Guide  Direct Dial : [phone]  Website: [email or web address]

## 2023-07-02 ENCOUNTER — Telehealth: Payer: Self-pay

## 2023-07-02 ENCOUNTER — Other Ambulatory Visit (INDEPENDENT_AMBULATORY_CARE_PROVIDER_SITE_OTHER): Admitting: Pharmacist

## 2023-07-02 DIAGNOSIS — E1159 Type 2 diabetes mellitus with other circulatory complications: Secondary | ICD-10-CM

## 2023-07-02 NOTE — Progress Notes (Signed)
   07/02/2023 Name: [name] MRN: [id number] DOB: 09/07/1934  Subjective  Chief Complaint  Patient presents with   Medication Access   Care Team: Primary Care Provider: Donnie Galea, MD  Reason for visit: ?  [name] is a 89 y.o. male who presents today for a telephone visit with the pharmacist due to medication access concerns regarding Jardiance . ?   Medication Access: ?  Reports that all medications are not affordable.  $300 at the pharmacy for Jardiance    Prescription drug coverage: YES Payor: Advertising copywriter MEDICARE / Plan: [facility] MEDICARE / Product Type: *No Product type* / .   Current Patient Assistance: None  Patient lives in a household of 1 with an estimated combined annual income of only social security retirement.   Assessment and Plan:   1. Medication Access Patient is not eligible for copay cards due to government insurance. [employer]well foundation  Cardiomyopathy fund should assist with cost of Jardiance  if diagnosis is verified. Will attempt enrollment with recent cardiology encounter note.  Notably, would assist with cost of other CHF medication such as Entresto which has been considered by cardiology team at recent encounter.  Patient may qualify for patient assistance for other brand medications as needed in the future based on income. Would likely be eligible to receive Jardiance  or Farxiga and Entresto though would require application to program(s)   [employer] - Initial enrollment 2025   Medication(s): All CHF medications    Application Status:  Approved for re-enrollment conditionally - pending diagnosis verification   HealthWell: ID [id number] Fund: Hypercholesterolemia - Medicare Access Assistance Type: Co-pay Start Date: 06/02/2023 End Date: 05/31/2024               Rx Card: Card No.  [id number] RX BIN:  [id number] PCN:  [id number] Group:  [id number]     Daron Ellen, PharmD Clinical Pharmacist [facility] [phone]   Future Appointments  Date Time Provider Department Center  07/23/2023  1:00 PM [facility] ANNUAL WELLNESS VISIT 1 [facility] PEC  09/24/2023  2:00 PM Donnie Galea, MD [facility]  09/29/2023  3:35 PM Valentina Gasman, Karon Packer., NP [facility]   Daron Ellen, PharmD Clinical Pharmacist [facility] [phone]

## 2023-07-02 NOTE — Transitions of Care (Post Inpatient/ED Visit) (Signed)
   07/02/2023  Name: NIHAAL FRIESEN MRN: [id number] DOB: [birth year]  Today's TOC FU Call Status: Today's TOC FU Call Status:: Unsuccessful Call (2nd Attempt) Unsuccessful Call (2nd Attempt) Date: 07/02/23 Week 3   Attempted to reach the patient regarding the most recent Inpatient/ED visit.  Follow Up Plan: Additional outreach attempts will be made to reach the patient to complete the Transitions of Care (Post Inpatient/ED visit) call.   [place], RN, BSN, CCM [facility], [facility] RN Care Manager Direct Dial : [phone]

## 2023-07-04 ENCOUNTER — Other Ambulatory Visit: Payer: Self-pay | Admitting: Family Medicine

## 2023-07-16 ENCOUNTER — Telehealth: Payer: Self-pay

## 2023-07-19 ENCOUNTER — Other Ambulatory Visit: Payer: Self-pay | Admitting: Family Medicine

## 2023-07-19 NOTE — Telephone Encounter (Signed)
 Please check with patient's son.  My understanding is the Jardiance  should not be expensive for him now.  If he is willing to start the medication then let me know and I can send a prescription if needed.  Thanks.

## 2023-07-21 NOTE — Telephone Encounter (Signed)
 Left voicemail for patient to return call to office.

## 2023-07-23 ENCOUNTER — Ambulatory Visit: Payer: Medicare Other

## 2023-07-23 VITALS — Ht 72.0 in | Wt 214.0 lb

## 2023-07-23 DIAGNOSIS — Z0000 Encounter for general adult medical examination without abnormal findings: Secondary | ICD-10-CM | POA: Diagnosis not present

## 2023-07-23 NOTE — Progress Notes (Signed)
 Subjective:   [name] is a 89 y.o. who presents for a Medicare Wellness preventive visit.  As a reminder, Annual Wellness Visits don't include a physical exam, and some assessments may be limited, especially if this visit is performed virtually. We may recommend an in-person follow-up visit with your provider if needed.  Visit Complete: Virtual I connected with  [name] on 07/23/23 by a audio enabled telemedicine application and verified that I am speaking with the correct person using two identifiers.  Patient Location: Home  Provider Location: Office/Clinic  I discussed the limitations of evaluation and management by telemedicine. The patient expressed understanding and agreed to proceed.  Vital Signs: Because this visit was a virtual/telehealth visit, some criteria may be missing or patient reported. Any vitals not documented were not able to be obtained and vitals that have been documented are patient reported.  VideoDeclined- This patient declined Librarian, academic. Therefore the visit was completed with audio only.  Persons Participating in Visit: Patient.  AWV Questionnaire: No: Patient Medicare AWV questionnaire was not completed prior to this visit.  Cardiac Risk Factors include: advanced age (>51men, >63 women);diabetes mellitus;dyslipidemia;male gender;hypertension;sedentary lifestyle     Objective:    Today's Vitals   07/23/23 1305  Weight: 214 lb (97.1 kg)  Height: 6' (1.829 m)   Body mass index is 29.02 kg/m.     07/23/2023    1:17 PM 06/09/2023    6:27 PM 06/08/2023    2:33 PM 04/09/2023    3:00 PM 04/08/2023   10:06 PM 04/06/2023   12:51 PM 03/15/2023   12:07 PM  Advanced Directives  Does Patient Have a Medical Advance Directive? Yes Yes Yes Yes Yes No Yes  Type of Estate agent of [place];Living will Living will Healthcare Power of [place];Living will Living will;Healthcare Power of [employer] Power of [place];Living will  Healthcare Power of [place];Living will  Does patient want to make changes to medical advance directive?  No - Patient declined  No - Patient declined     Copy of Healthcare Power of Attorney in Chart? Yes - validated most recent copy scanned in chart (See row information)   No - copy requested       Current Medications (verified) Outpatient Encounter Medications as of 07/23/2023  Medication Sig   Accu-Chek Softclix Lancets lancets TEST BLOOD SUGAR UP TO THREE TIMES DAILY   amLODipine  (NORVASC ) 10 MG tablet Take 10 mg by mouth daily.   aspirin  EC 81 MG tablet Take 81 mg by mouth every evening.   Blood Glucose Calibration (ACCU-CHEK AVIVA) SOLN Use as directed monthly. E11.59  Insulin  dependent   Blood Glucose Monitoring Suppl (ACCU-CHEK AVIVA PLUS) w/Device KIT Check sugar up to 3 times daily. E11.59  Insulin  dependent   carvedilol  (COREG ) 12.5 MG tablet TAKE 1 TABLET BY MOUTH TWICE  DAILY WITH MEALS   COD LIVER OIL PO Take 1 capsule by mouth 2 (two) times daily.    Continuous Glucose Receiver (FREESTYLE LIBRE 3 READER) DEVI 1 each by Does not apply route as directed.   Continuous Glucose Sensor (FREESTYLE LIBRE 3 SENSOR) MISC Place 1 sensor on the skin every 14 days. Use to check glucose continuously   cyanocobalamin  (,VITAMIN B-12,) 1000 MCG/ML injection Inject 1 mL (1,000 mcg total) into the muscle every 30 (thirty) days. Dispense 3 of the 1 mL vials.   diclofenac  Sodium (VOLTAREN ) 1 % GEL Apply 2 g topically 4 (four) times daily.  DROPLET PEN NEEDLES 30G X 8 MM MISC USE  WITH  INSULIN   PEN   empagliflozin  (JARDIANCE ) 10 MG TABS tablet Take 10 mg by mouth daily.   escitalopram  (LEXAPRO ) 20 MG tablet Take 1 tablet (20 mg total) by mouth daily.   furosemide  (LASIX ) 40 MG tablet Take 1 tablet (40 mg total) by mouth daily. Can take an additional 20mg  as needed for weight gain of 2 lbs in a day or 5lbs in a week   hydrALAZINE  (APRESOLINE ) 50 MG tablet  TAKE 1 TABLET BY MOUTH 3 TIMES  DAILY   insulin  glargine, 1 Unit Dial , ([name]  SOLOSTAR) 300 UNIT/ML Solostar Pen Inject 20 Units into the skin at bedtime.   insulin  lispro (HUMALOG  KWIKPEN) 100 UNIT/ML KwikPen INJECT SUBCUTANEOUSLY 4 TO 10  UNITS 3 TIMES DAILY BEFORE MEALS PER SLIDING SCALE: 151-200 = 4  UN, 201-250 = 6 UN, 251-300 = 8  UN, AND OVER 300 = 10 UN   losartan  (COZAAR ) 25 MG tablet Take 1 tablet (25 mg total) by mouth daily.   meclizine  (ANTIVERT ) 12.5 MG tablet TAKE 1 TABLET BY MOUTH 3 TIMES DAILY AS NEEDED FOR DIZZINESS.   naproxen  sodium (ALEVE ) 220 MG tablet Take 220 mg by mouth daily as needed.   ONETOUCH ULTRA test strip USE TO CHECK UP TO 3 TIMES DAILY AS NEEDED   potassium chloride  SA (KLOR-CON  M) 20 MEQ tablet Take 2 tablets (40 mEq total) by mouth daily.   SYRINGE-NEEDLE, DISP, 3 ML (B-D 3CC LUER-LOK SYR 25GX1) 25G X 1 3 ML MISC USE TO INJECT VITAMIN B12 MONTHLY.   tamsulosin  (FLOMAX ) 0.4 MG CAPS capsule TAKE 1 CAPSULE BY MOUTH DAILY  AFTER SUPPER   traMADol  (ULTRAM ) 50 MG tablet TAKE 1 TABLET BY MOUTH EVERY 12 HOURS AS NEEDED FOR KNEE PAIN.   ondansetron  (ZOFRAN ) 4 MG tablet Take 1 tablet (4 mg total) by mouth every 6 (six) hours. (Patient not taking: Reported on 07/23/2023)   No facility-administered encounter medications on file as of 07/23/2023.    Allergies (verified) Morphine , Atorvastatin , Codeine phosphate, and Metformin  and related   History: Past Medical History:  Diagnosis Date   Anxiety    Borderline hyperlipidemia    CAD (coronary artery disease)    Colon polyps    Complication of anesthesia    trouble urinating after anesthesia   Diverticulosis of colon    DJD (degenerative joint disease)    DM (diabetes mellitus) (HCC)    Adult onset  type 2   History of gastritis    History of Mathews stones    surgery to remove   History of pyelonephritis    History of syncope    HTN (hypertension)    Hypertrophy of prostate with urinary obstruction and  other lower urinary tract symptoms (LUTS)    Melanoma (HCC)    back of neck   Melanoma (HCC) 2022   per dermatology   Myoclonus    Pancreatitis due to biliary obstruction several years ago   treated by Dr. Joshua Nieves   Past Surgical History:  Procedure Laterality Date   APPENDECTOMY     cartarized  nose blood vessel     left side   CATARACT EXTRACTION     RIGHT EYE   CHOLECYSTECTOMY     cystoscopy  04/17/11   stent placed   CYSTOSCOPY W/ URETERAL STENT PLACEMENT  04/17/2011   Procedure: CYSTOSCOPY WITH RETROGRADE PYELOGRAM/URETERAL STENT PLACEMENT;  Surgeon: Jinny Mounts, MD;  Location: [facility];  Service:  Urology;  Laterality: Left;   CYSTOSCOPY WITH RETROGRADE PYELOGRAM, URETEROSCOPY AND STENT PLACEMENT Left 04/18/2013   Procedure: CYSTOSCOPY WITH LEFT RETROGRADE PYELOGRAM, LEFT URETEROSCOPY AND LASER LITHOTRIPSY LEFT STENT PLACEMENT;  Surgeon: Kristeen Peto, MD;  Location: [facility];  Service: Urology;  Laterality: Left;   HOLMIUM LASER APPLICATION Left 04/18/2013   Procedure: HOLMIUM LASER APPLICATION;  Surgeon: Kristeen Peto, MD;  Location: [facility];  Service: Urology;  Laterality: Left;   INCISION AND DRAINAGE ABSCESS Left 04/04/2020   Procedure: Otilio Block OF LEFT ARM HEMATOMA;  Surgeon: Lockie Rima, MD;  Location: [facility];  Service: General;  Laterality: Left;  left   IRRIGATION AND DEBRIDEMENT ABSCESS Left 10/05/2014   Procedure: Ples Brim D LEFT INDEX FINGER;  Surgeon: Mauricia South, MD;  Location: [facility];  Service: Plastics;  Laterality: Left;   KNEE SURGERY     bilateral ARTHROSCOPY X2 TO EACH KNEE   MELANOMA EXCISION  2016/08/21   MELANOMA EXCISION WITH SENTINEL LYMPH NODE BIOPSY Left 03/29/2020   Procedure: WIDE LOCAL EXCISION, ADVANCED FLAP CLOSURE LEFT ARM MELANOMA DEFECT WITH SENTINEL LYMPH NODE MAPPING AND BIOPSY;  Surgeon: Lockie Rima, MD;  Location: [facility];  Service: General;  Laterality: Left;   S/P cysto & stents for kidnedy stone 11/08 by Dr. 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 Murrell Arrant   Family History  Problem Relation Age of Onset   Heart attack Mother    Dementia Brother    Colon cancer Neg Hx    Prostate cancer Neg Hx    Social History   Socioeconomic History   Marital status: Widowed    Spouse name: Not on file   Number of children: 2   Years of education: Not on file   Highest education level: Not on file  Occupational History   Occupation: Retired  Tobacco Use   Smoking status: Never    Passive exposure: Never   Smokeless tobacco: Never  Vaping Use   Vaping status: Never Used  Substance and Sexual Activity   Alcohol use: No    Alcohol/week: 0.0 standard drinks of alcohol   Drug use: No   Sexual activity: Not Currently  Other Topics Concern   Not on file  Social History Narrative   Widowed Aug 21, 2004   Initially 2 kids- 1 son still alive as of 08/22/2022.  Daughter died 06-22-18 in coronavirus pandemic.  Son lives with patient.    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   Right handed    Social Drivers of Corporate investment banker Strain: Low Risk  (07/23/2023)   Overall Financial Resource Strain (CARDIA)    Difficulty of Paying Living Expenses: Not hard at all  Food Insecurity: No Food Insecurity (07/23/2023)   Hunger Vital Sign    Worried About Running Out of Food in the Last Year: Never true    Ran Out of Food in the Last Year: Never true  Transportation Needs: No Transportation Needs (07/23/2023)   PRAPARE - Administrator, Civil Service (Medical): No    Lack of Transportation (Non-Medical): No  Physical Activity: Inactive (07/23/2023)   Exercise Vital Sign    Days of Exercise per Week: 0 days    Minutes of Exercise per Session: 0 min  Stress: No Stress Concern Present (07/23/2023)   [employer] of Occupational  Health - Occupational Stress Questionnaire    Feeling of Stress: Not at  all  Social Connections: Moderately Isolated (07/23/2023)   Social Connection and Isolation Panel    Frequency of Communication with Friends and Family: More than three times a week    Frequency of Social Gatherings with Friends and Family: More than three times a week    Attends Religious Services: More than 4 times per year    Active Member of [employer] or Organizations: No    Attends Banker Meetings: Never    Marital Status: Widowed    Tobacco Counseling Counseling given: Not Answered    Clinical Intake:  Pre-visit preparation completed: Yes  Pain : No/denies pain     BMI - recorded: 29.02 Nutritional Status: BMI 25 -29 Overweight Nutritional Risks: None Diabetes: Yes CBG done?: Yes (BS 138 this am at home) CBG resulted in Enter/ Edit results?: No Did pt. bring in CBG monitor from home?: No  Lab Results  Component Value Date   HGBA1C 8.7 (H) 06/16/2023   HGBA1C 8.3 (A) 12/29/2022   HGBA1C 9.6 (H) 07/23/2022     How often do you need to have someone help you when you read instructions, pamphlets, or other written materials from your doctor or pharmacy?: 1 - Never  Interpreter Needed?: No  Comments: son and girlfriend lives with pt Information entered by :: B.Norwin Aleman,LPN   Activities of Daily Living     07/23/2023    1:18 PM 06/09/2023    6:00 PM  In your present state of health, do you have any difficulty performing the following activities:  Hearing? 1 1  Vision? 0 0  Difficulty concentrating or making decisions? 0 0  Walking or climbing stairs? 0   Dressing or bathing? 0   Doing errands, shopping? 1 0  Preparing Food and eating ? N   Using the Toilet? N   In the past six months, have you accidently leaked urine? N   Do you have problems with loss of bowel control? N   Managing your Medications? N   Managing your Finances? N   Housekeeping or managing your Housekeeping? N     Patient Care Team: Donnie Galea, MD as PCP - General  (Family Medicine) Hugh Madura, MD as PCP - Cardiology (Cardiology) Maris Sickle, MD as Consulting Physician (Ophthalmology) Eduardo Grade, MD as Consulting Physician (Dermatology) Wendolyn Hamburger, MD as Consulting Physician (Orthopedic Surgery) Florencio Hunting, MD as Consulting Physician (Urology) Callie Cater, DDS as Referring Physician (Dentistry) Alta Ast, [facility] (Inactive) as Pharmacist (Pharmacist) Jonathan Neighbor, [facility] (Inactive) as Pharmacist (Pharmacist) Jhonny Moss, MD as Consulting Physician (Neurology)  I have updated your Care Teams any recent Medical Services you may have received from other providers in the past year.     Assessment:   This is a routine wellness examination for [name].  Hearing/Vision screen Hearing Screening - Comments:: Pt says he has hearing aids in which he does not wear much Vision Screening - Comments:: Pt says his vision is good w/glasses Dr Candi Chafe   Goals Addressed             This Visit's Progress    DIET - EAT MORE FRUITS AND VEGETABLES   On track    07/23/23     Increase water  intake   On track    07/23/23 I will continue to drink 6-8 glasses of water  daily.       Patient Stated   On  track    07/23/23-No new goals       Depression Screen     07/23/2023    1:14 PM 03/24/2023   12:08 PM 12/29/2022   10:01 AM 07/29/2022   12:16 PM 07/22/2022    1:12 PM 07/19/2021   11:01 AM 07/18/2020   10:31 AM  PHQ 2/9 Scores  PHQ - 2 Score 0 0 0 1 0 0 0  PHQ- 9 Score  0 0 1   0    Fall Risk     07/23/2023    1:12 PM 04/06/2023   12:51 PM 12/29/2022   10:00 AM 07/29/2022   12:16 PM 07/22/2022    1:08 PM  Fall Risk   Falls in the past year? 0 0 0 1 1  Number falls in past yr: 0 0 0 1 1  Injury with Fall? 0 0 0 0 0  Risk for fall due to : No Fall Risks  No Fall Risks History of fall(s);Impaired balance/gait Impaired balance/gait  Follow up Falls prevention discussed;Education provided Falls evaluation completed Falls  evaluation completed Falls evaluation completed Falls evaluation completed;Falls prevention discussed;Education provided    MEDICARE RISK AT HOME:  Medicare Risk at Home Any stairs in or around the home?: Yes If so, are there any without handrails?: Yes Home free of loose throw rugs in walkways, pet beds, electrical cords, etc?: Yes Adequate lighting in your home to reduce risk of falls?: Yes Life alert?: No Use of a cane, walker or w/c?: Yes Grab bars in the bathroom?: Yes Shower chair or bench in shower?: Yes Elevated toilet seat or a handicapped toilet?: Yes  TIMED UP AND GO:  Was the test performed?  No  Cognitive Function: 6CIT completed    07/18/2020   10:34 AM 12/25/2017   11:01 AM 12/17/2016    9:40 AM 12/17/2015   11:01 AM 05/04/2014   10:14 AM  MMSE - Mini Mental State Exam  Orientation to time 5 5 5  5  4    Orientation to Place 5 5 5  5  5    Registration 3 3 3  3  3    Attention/ Calculation 5 0 0  0  0   Recall 3 3 3  2  3    Recall-comments    pt was unable to recall 1 of 3 words    Language- name 2 objects  0 0  0  2   Language- repeat 1 1 1 1 1   Language- follow 3 step command  3 2  3  3    Language- follow 3 step command-comments   unable to follow 1 step of 3 step command     Language- read & follow direction  0 0  0  1   Write a sentence  0 0  0  1   Copy design  0 0  0  0   Total score  20 19  19  23       Data saved with a previous flowsheet row definition        07/23/2023    1:19 PM 07/22/2022    1:14 PM  6CIT Screen  What Year? 0 points 0 points  What month? 0 points 0 points  What time? 0 points 0 points  Count back from 20 0 points 4 points  Months in reverse 0 points 4 points  Repeat phrase 8 points 2 points  Total Score 8 points 10 points    Immunizations Immunization  History  Administered Date(s) Administered   Fluad Quad(high Dose 65+) 10/25/2018, 01/03/2020, 01/07/2021   Fluad Trivalent(High Dose 65+) 12/29/2022   Influenza Split  11/30/2010, 11/24/2011   Influenza Whole 03/27/2009   Influenza, High Dose Seasonal PF 12/05/2014, 12/17/2016, 12/25/2017, 11/15/2021   Influenza,inj,Quad PF,6+ Mos 12/29/2012   Influenza-Unspecified 11/10/2013   Moderna Sars-Covid-2 Vaccination 04/21/2019, 05/23/2019, 12/20/2019, 06/07/2020   Pneumococcal Conjugate-13 11/10/2013   Pneumococcal Polysaccharide-23 09/06/2004   Rsv, Bivalent, Protein Subunit Rsvpref,pf (Abrysvo) 11/15/2021   Td 02/12/2009   Tdap 10/09/2011, 08/15/2014, 05/06/2022   Zoster, Live 07/28/2013    Screening Tests Health Maintenance  Topic Date Due   Zoster Vaccines- Shingrix (1 of 2) 12/23/1953   FOOT EXAM  07/29/2023   OPHTHALMOLOGY EXAM  08/13/2023   INFLUENZA VACCINE  09/11/2023   HEMOGLOBIN A1C  12/17/2023   Medicare Annual Wellness (AWV)  07/22/2024   DTaP/Tdap/Td (5 - Td or Tdap) 05/05/2032   Pneumococcal Vaccine: 50+ Years  Completed   HPV VACCINES  Aged Out   Meningococcal B Vaccine  Aged Out   COVID-19 Vaccine  Discontinued    Health Maintenance  Health Maintenance Due  Topic Date Due   Zoster Vaccines- Shingrix (1 of 2) 12/23/1953   Health Maintenance Items Addressed: None needed at this time  Additional Screening:  Vision Screening: Recommended annual ophthalmology exams for early detection of glaucoma and other disorders of the eye. Would you like a referral to an eye doctor? No    Dental Screening: Recommended annual dental exams for proper oral hygiene  Community Resource Referral / Chronic Care Management: CRR required this visit?  No   CCM required this visit?  No   Plan:    I have personally reviewed and noted the following in the patient's chart:   Medical and social history Use of alcohol, tobacco or illicit drugs  Current medications and supplements including opioid prescriptions. Patient is currently taking opioid prescriptions. Information provided to patient regarding non-opioid alternatives. Patient advised to  discuss non-opioid treatment plan with their provider. Functional ability and status Nutritional status Physical activity Advanced directives List of other physicians Hospitalizations, surgeries, and ER visits in previous 12 months Vitals Screenings to include cognitive, depression, and falls Referrals and appointments  In addition, I have reviewed and discussed with patient certain preventive protocols, quality metrics, and best practice recommendations. A written personalized care plan for preventive services as well as general preventive health recommendations were provided to patient.   Nerissa Bannister, LPN   09/04/3662   After Visit Summary: (Declined) Due to this being a telephonic visit, with patients personalized plan was offered to patient but patient Declined AVS at this time   Notes: Nothing significant to report at this time.

## 2023-07-23 NOTE — Patient Instructions (Signed)
 [name] , Thank you for taking time out of your busy schedule to complete your Annual Wellness Visit with me. I enjoyed our conversation and look forward to speaking with you again next year. I, as well as your care team,  appreciate your ongoing commitment to your health goals. Please review the following plan we discussed and let me know if I can assist you in the future. Your Game plan/ To Do List    Follow up Visits: Next Medicare AWV with our clinical staff: 07/26/2024 @ 1pm   Have you seen your provider in the last 6 months (3 months if uncontrolled diabetes)? Yes Next Office Visit with your provider: 09/24/23  Clinician Recommendations:  Aim for 30 minutes of exercise or brisk walking, 6-8 glasses of water , and 5 servings of fruits and vegetables each day.       This is a list of the screening recommended for you and due dates:  Health Maintenance  Topic Date Due   Zoster (Shingles) Vaccine (1 of 2) 12/23/1953   Complete foot exam   07/29/2023   Eye exam for diabetics  08/13/2023   Flu Shot  09/11/2023   Hemoglobin A1C  12/17/2023   Medicare Annual Wellness Visit  07/22/2024   DTaP/Tdap/Td vaccine (5 - Td or Tdap) 05/05/2032   Pneumococcal Vaccine for age over 25  Completed   HPV Vaccine  Aged Out   Meningitis B Vaccine  Aged Out   COVID-19 Vaccine  Discontinued    Advanced directives: (In Chart) A copy of your advanced directives are scanned into your chart should your provider ever need it. Advance Care Planning is important because it:  [x]  Makes sure you receive the medical care that is consistent with your values, goals, and preferences  [x]  It provides guidance to your family and loved ones and reduces their decisional burden about whether or not they are making the right decisions based on your wishes.  Follow the link provided in your after visit summary or read over the paperwork we have mailed to you to help you started getting your Advance Directives in place. If  you need assistance in completing these, please reach out to us  so that we can help you!

## 2023-07-24 NOTE — Telephone Encounter (Signed)
 Left voicemail for patient to return call to office.

## 2023-08-06 ENCOUNTER — Emergency Department (HOSPITAL_COMMUNITY)
Admission: EM | Admit: 2023-08-06 | Discharge: 2023-08-06 | Discharge status: Against Medical Advice | Attending: Emergency Medicine | Admitting: Emergency Medicine

## 2023-08-06 ENCOUNTER — Emergency Department (HOSPITAL_COMMUNITY)

## 2023-08-06 ENCOUNTER — Encounter (HOSPITAL_COMMUNITY): Payer: Self-pay | Admitting: *Deleted

## 2023-08-06 ENCOUNTER — Other Ambulatory Visit: Payer: Self-pay

## 2023-08-06 DIAGNOSIS — E162 Hypoglycemia, unspecified: Secondary | ICD-10-CM | POA: Insufficient documentation

## 2023-08-06 DIAGNOSIS — R0989 Other specified symptoms and signs involving the circulatory and respiratory systems: Secondary | ICD-10-CM | POA: Diagnosis not present

## 2023-08-06 DIAGNOSIS — R918 Other nonspecific abnormal finding of lung field: Secondary | ICD-10-CM | POA: Diagnosis not present

## 2023-08-06 DIAGNOSIS — I10 Essential (primary) hypertension: Secondary | ICD-10-CM | POA: Diagnosis not present

## 2023-08-06 DIAGNOSIS — I700 Atherosclerosis of aorta: Secondary | ICD-10-CM | POA: Diagnosis not present

## 2023-08-06 DIAGNOSIS — R059 Cough, unspecified: Secondary | ICD-10-CM | POA: Insufficient documentation

## 2023-08-06 DIAGNOSIS — Z5321 Procedure and treatment not carried out due to patient leaving prior to being seen by health care provider: Secondary | ICD-10-CM | POA: Insufficient documentation

## 2023-08-06 LAB — CBC WITH DIFFERENTIAL/PLATELET
Abs Immature Granulocytes: 0.18 10*3/uL — ABNORMAL HIGH (ref 0.00–0.07)
Basophils Absolute: 0.1 10*3/uL (ref 0.0–0.1)
Basophils Relative: 1 %
Eosinophils Absolute: 0.3 10*3/uL (ref 0.0–0.5)
Eosinophils Relative: 3 %
HCT: 39.3 % (ref 39.0–52.0)
Hemoglobin: 12.9 g/dL — ABNORMAL LOW (ref 13.0–17.0)
Immature Granulocytes: 2 %
Lymphocytes Relative: 13 %
Lymphs Abs: 1.6 10*3/uL (ref 0.7–4.0)
MCH: 30.1 pg (ref 26.0–34.0)
MCHC: 32.8 g/dL (ref 30.0–36.0)
MCV: 91.6 fL (ref 80.0–100.0)
Monocytes Absolute: 1.3 10*3/uL — ABNORMAL HIGH (ref 0.1–1.0)
Monocytes Relative: 10 %
Neutro Abs: 8.7 10*3/uL — ABNORMAL HIGH (ref 1.7–7.7)
Neutrophils Relative %: 71 %
Platelets: 354 10*3/uL (ref 150–400)
RBC: 4.29 MIL/uL (ref 4.22–5.81)
RDW: 14.3 % (ref 11.5–15.5)
WBC: 12.2 10*3/uL — ABNORMAL HIGH (ref 4.0–10.5)
nRBC: 0 % (ref 0.0–0.2)

## 2023-08-06 LAB — COMPREHENSIVE METABOLIC PANEL WITH GFR
ALT: 19 U/L (ref 0–44)
AST: 21 U/L (ref 15–41)
Albumin: 3.2 g/dL — ABNORMAL LOW (ref 3.5–5.0)
Alkaline Phosphatase: 64 U/L (ref 38–126)
Anion gap: 13 (ref 5–15)
BUN: 19 mg/dL (ref 8–23)
CO2: 25 mmol/L (ref 22–32)
Calcium: 9.7 mg/dL (ref 8.9–10.3)
Chloride: 97 mmol/L — ABNORMAL LOW (ref 98–111)
Creatinine, Ser: 1.14 mg/dL (ref 0.61–1.24)
GFR, Estimated: >60 mL/min (ref >60)
Glucose, Bld: 109 mg/dL — ABNORMAL HIGH (ref 70–99)
Potassium: 4 mmol/L (ref 3.5–5.1)
Sodium: 135 mmol/L (ref 135–145)
Total Bilirubin: 0.4 mg/dL (ref 0.0–1.2)
Total Protein: 6.3 g/dL — ABNORMAL LOW (ref 6.5–8.1)

## 2023-08-06 LAB — CBG MONITORING, ED: Glucose-Capillary: 110 mg/dL — ABNORMAL HIGH (ref 70–99)

## 2023-08-06 LAB — I-STAT CG4 LACTIC ACID, ED: Lactic Acid, Venous: 1.5 mmol/L (ref 0.5–1.9)

## 2023-08-06 LAB — RESP PANEL BY RT-PCR (RSV, FLU A&B, COVID)  RVPGX2
Influenza A by PCR: NEGATIVE
Influenza B by PCR: NEGATIVE
Resp Syncytial Virus by PCR: NEGATIVE
SARS Coronavirus 2 by RT PCR: NEGATIVE

## 2023-08-06 NOTE — ED Triage Notes (Signed)
 Pt arrives via [facility], pt family called out for his glucose levels being as high as  400- and  down to 58, last 81, taking all meds as prescribed. Cough for 2 weeks 88-92% on RA, hx of CHF, rhonchi in bases. Pt denies SOB. 164/70, hr 86, a/o x 4.

## 2023-08-06 NOTE — ED Triage Notes (Signed)
 Pt says that his sugar was 400 down to the 50's tonight. He has had a productive cough (with thick yellow sputum) and discharge in his eyes for several weeks. He denies fevers, sob or pain.

## 2023-08-06 NOTE — ED Notes (Signed)
 Pt left AMA

## 2023-08-07 ENCOUNTER — Encounter: Payer: Self-pay | Admitting: Emergency Medicine

## 2023-08-07 ENCOUNTER — Ambulatory Visit: Payer: Self-pay | Admitting: Urgent Care

## 2023-08-07 ENCOUNTER — Ambulatory Visit (INDEPENDENT_AMBULATORY_CARE_PROVIDER_SITE_OTHER)

## 2023-08-07 ENCOUNTER — Ambulatory Visit
Admission: EM | Admit: 2023-08-07 | Discharge: 2023-08-07 | Disposition: A | Discharge status: Home / Self Care | Attending: Family Medicine | Admitting: Family Medicine

## 2023-08-07 DIAGNOSIS — R0902 Hypoxemia: Secondary | ICD-10-CM | POA: Diagnosis not present

## 2023-08-07 DIAGNOSIS — J984 Other disorders of lung: Secondary | ICD-10-CM | POA: Diagnosis not present

## 2023-08-07 DIAGNOSIS — R051 Acute cough: Secondary | ICD-10-CM | POA: Diagnosis not present

## 2023-08-07 DIAGNOSIS — R918 Other nonspecific abnormal finding of lung field: Secondary | ICD-10-CM | POA: Diagnosis not present

## 2023-08-07 DIAGNOSIS — J189 Pneumonia, unspecified organism: Secondary | ICD-10-CM | POA: Diagnosis not present

## 2023-08-07 DIAGNOSIS — J168 Pneumonia due to other specified infectious organisms: Secondary | ICD-10-CM

## 2023-08-07 DIAGNOSIS — J069 Acute upper respiratory infection, unspecified: Secondary | ICD-10-CM | POA: Diagnosis not present

## 2023-08-07 DIAGNOSIS — B9689 Other specified bacterial agents as the cause of diseases classified elsewhere: Secondary | ICD-10-CM

## 2023-08-07 DIAGNOSIS — R058 Other specified cough: Secondary | ICD-10-CM | POA: Diagnosis not present

## 2023-08-07 LAB — POCT FASTING CBG KUC MANUAL ENTRY: POCT Glucose (KUC): 115 mg/dL — AB (ref 70–99)

## 2023-08-07 MED ORDER — AZITHROMYCIN 250 MG PO TABS
ORAL_TABLET | ORAL | 0 refills | Status: DC
Start: 2023-08-07 — End: 2023-09-29

## 2023-08-07 MED ORDER — AMOXICILLIN-POT CLAVULANATE 875-125 MG PO TABS: 1.0000 | ORAL_TABLET | Freq: Two times a day (BID) | ORAL | 0 refills | Status: DC

## 2023-08-07 MED ORDER — BENZONATATE 100 MG PO CAPS: 100.0000 mg | ORAL_CAPSULE | Freq: Three times a day (TID) | ORAL | 0 refills | Status: DC | PRN

## 2023-08-07 NOTE — ED Provider Notes (Addendum)
 [facility]  Note:  This document was prepared using Conservation officer, historic buildings and may include unintentional dictation errors.  MRN: [id number] DOB: 1934-11-22  Subjective:   [name] is a 89 y.o. male presenting for 2-week history of persistent and worsening malaise, fatigue, productive cough, sinus congestion and drainage, throat pain.  Has felt short of breath with coughing spells.  No fever. No history of asthma.  Patient is a diabetic treated with insulin .  Last A1c was 8.7% 06/16/2023.  Patient is not a smoker.   No current facility-administered medications for this encounter.  Current Outpatient Medications:    Accu-Chek Softclix Lancets lancets, TEST BLOOD SUGAR UP TO THREE TIMES DAILY, Disp: 300 each, Rfl: 3   amLODipine  (NORVASC ) 10 MG tablet, Take 10 mg by mouth daily., Disp: , Rfl:    aspirin  EC 81 MG tablet, Take 81 mg by mouth every evening., Disp: , Rfl:    Blood Glucose Calibration (ACCU-CHEK AVIVA) SOLN, Use as directed monthly. E11.59  Insulin  dependent, Disp: 1 each, Rfl: 3   Blood Glucose Monitoring Suppl (ACCU-CHEK AVIVA PLUS) w/Device KIT, Check sugar up to 3 times daily. E11.59  Insulin  dependent, Disp: 1 kit, Rfl: 0   carvedilol  (COREG ) 12.5 MG tablet, TAKE 1 TABLET BY MOUTH TWICE  DAILY WITH MEALS, Disp: 200 tablet, Rfl: 2   COD LIVER OIL PO, Take 1 capsule by mouth 2 (two) times daily. , Disp: , Rfl:    Continuous Glucose Receiver (FREESTYLE LIBRE 3 READER) DEVI, 1 each by Does not apply route as directed., Disp: 1 each, Rfl: 0   Continuous Glucose Sensor (FREESTYLE LIBRE 3 SENSOR) MISC, Place 1 sensor on the skin every 14 days. Use to check glucose continuously, Disp: 2 each, Rfl: 3   cyanocobalamin  (,VITAMIN B-12,) 1000 MCG/ML injection, Inject 1 mL (1,000 mcg total) into the muscle every 30 (thirty) days. Dispense 3 of the 1 mL vials., Disp: 3 mL, Rfl: 3   diclofenac  Sodium (VOLTAREN ) 1 % GEL, Apply 2 g topically 4 (four)  times daily., Disp: , Rfl:    DROPLET PEN NEEDLES 30G X 8 MM MISC, USE  WITH  INSULIN   PEN, Disp: 400 each, Rfl: 5   empagliflozin  (JARDIANCE ) 10 MG TABS tablet, Take 10 mg by mouth daily., Disp: , Rfl:    escitalopram  (LEXAPRO ) 20 MG tablet, Take 1 tablet (20 mg total) by mouth daily., Disp: 90 tablet, Rfl: 1   furosemide  (LASIX ) 40 MG tablet, Take 1 tablet (40 mg total) by mouth daily. Can take an additional 20mg  as needed for weight gain of 2 lbs in a day or 5lbs in a week, Disp: 45 tablet, Rfl: 1   hydrALAZINE  (APRESOLINE ) 50 MG tablet, TAKE 1 TABLET BY MOUTH 3 TIMES  DAILY, Disp: 300 tablet, Rfl: 2   insulin  glargine, 1 Unit Dial , ([name]  SOLOSTAR) 300 UNIT/ML Solostar Pen, Inject 20 Units into the skin at bedtime., Disp: , Rfl:    insulin  lispro (HUMALOG  KWIKPEN) 100 UNIT/ML KwikPen, INJECT SUBCUTANEOUSLY 4 TO 10  UNITS 3 TIMES DAILY BEFORE MEALS PER SLIDING SCALE: 151-200 = 4  UN, 201-250 = 6 UN, 251-300 = 8  UN, AND OVER 300 = 10 UN, Disp: 30 mL, Rfl: 2   losartan  (COZAAR ) 25 MG tablet, Take 1 tablet (25 mg total) by mouth daily., Disp: 30 tablet, Rfl: 0   meclizine  (ANTIVERT ) 12.5 MG tablet, TAKE 1 TABLET BY MOUTH 3 TIMES DAILY AS NEEDED FOR DIZZINESS., Disp: 30  tablet, Rfl: 3   naproxen  sodium (ALEVE ) 220 MG tablet, Take 220 mg by mouth daily as needed., Disp: , Rfl:    ondansetron  (ZOFRAN ) 4 MG tablet, Take 1 tablet (4 mg total) by mouth every 6 (six) hours. (Patient not taking: Reported on 07/23/2023), Disp: 12 tablet, Rfl: 0   ONETOUCH ULTRA test strip, USE TO CHECK UP TO 3 TIMES DAILY AS NEEDED, Disp: 300 strip, Rfl: 2   potassium chloride  SA (KLOR-CON  M) 20 MEQ tablet, Take 2 tablets (40 mEq total) by mouth daily., Disp: 60 tablet, Rfl: 1   SYRINGE-NEEDLE, DISP, 3 ML (B-D 3CC LUER-LOK SYR 25GX1) 25G X 1 3 ML MISC, USE TO INJECT VITAMIN B12 MONTHLY., Disp: 3 each, Rfl: 1   tamsulosin  (FLOMAX ) 0.4 MG CAPS capsule, TAKE 1 CAPSULE BY MOUTH DAILY  AFTER SUPPER, Disp: 100 capsule, Rfl: 2    traMADol  (ULTRAM ) 50 MG tablet, TAKE 1 TABLET BY MOUTH EVERY 12 HOURS AS NEEDED FOR KNEE PAIN., Disp: 60 tablet, Rfl: 3   Allergies  Allergen Reactions   Morphine  Nausea Only, Other (See Comments) and Nausea And Vomiting    Can take with food and usually doesn't cause nausea   Atorvastatin      Intolerant - nausea/myalgias   Codeine Phosphate Nausea And Vomiting   Metformin  And Related Diarrhea    Past Medical History:  Diagnosis Date   Anxiety    Borderline hyperlipidemia    CAD (coronary artery disease)    Colon polyps    Complication of anesthesia    trouble urinating after anesthesia   Diverticulosis of colon    DJD (degenerative joint disease)    DM (diabetes mellitus) (HCC)    Adult onset  type 2   History of gastritis    History of kidney stones    surgery to remove   History of pyelonephritis    History of syncope    HTN (hypertension)    Hypertrophy of prostate with urinary obstruction and other lower urinary tract symptoms (LUTS)    Melanoma (HCC)    back of neck   Melanoma (HCC) 2022   per dermatology   Myoclonus    Pancreatitis due to biliary obstruction several years ago   treated by Dr. Princella Nida     Past Surgical History:  Procedure Laterality Date   APPENDECTOMY     cartarized  nose blood vessel     left side   CATARACT EXTRACTION     RIGHT EYE   CHOLECYSTECTOMY     cystoscopy  04/17/11   stent placed   CYSTOSCOPY W/ URETERAL STENT PLACEMENT  04/17/2011   Procedure: CYSTOSCOPY WITH RETROGRADE PYELOGRAM/URETERAL STENT PLACEMENT;  Surgeon: Thomasine Oiler, MD;  Location: [facility];  Service: Urology;  Laterality: Left;   CYSTOSCOPY WITH RETROGRADE PYELOGRAM, URETEROSCOPY AND STENT PLACEMENT Left 04/18/2013   Procedure: CYSTOSCOPY WITH LEFT RETROGRADE PYELOGRAM, LEFT URETEROSCOPY AND LASER LITHOTRIPSY LEFT STENT PLACEMENT;  Surgeon: Noretta Ferrara, MD;  Location: [facility];  Service: Urology;  Laterality: Left;   HOLMIUM LASER APPLICATION Left 04/18/2013    Procedure: HOLMIUM LASER APPLICATION;  Surgeon: Noretta Ferrara, MD;  Location: [facility];  Service: Urology;  Laterality: Left;   INCISION AND DRAINAGE ABSCESS Left 04/04/2020   Procedure: SELBY OF LEFT ARM HEMATOMA;  Surgeon: Aron Shoulders, MD;  Location: [facility];  Service: General;  Laterality: Left;  left   IRRIGATION AND DEBRIDEMENT ABSCESS Left 10/05/2014   Procedure: WILHEMENA D LEFT INDEX FINGER;  Surgeon: Balinda Rogue, MD;  Location: [facility];  Service: Plastics;  Laterality: Left;   KNEE SURGERY     bilateral ARTHROSCOPY X2 TO EACH KNEE   MELANOMA EXCISION  2018   MELANOMA EXCISION WITH SENTINEL LYMPH NODE BIOPSY Left 03/29/2020   Procedure: WIDE LOCAL EXCISION, ADVANCED FLAP CLOSURE LEFT ARM MELANOMA DEFECT WITH SENTINEL LYMPH NODE MAPPING AND BIOPSY;  Surgeon: Aron Shoulders, MD;  Location: [facility];  Service: General;  Laterality: Left;   S/P cysto & stents for kidnedy stone 11/08 by Dr. Amiel     S/P ELap 1986 w/excision of leiomyoma @ GE junction, Meckle's divertic resected, & cholecystectomy     SHOULDER SURGERY  04/2005   left by Dr. Yvone    Family History  Problem Relation Age of Onset   Heart attack Mother    Dementia Brother    Colon cancer Neg Hx    Prostate cancer Neg Hx     Social History   Tobacco Use   Smoking status: Never    Passive exposure: Never   Smokeless tobacco: Never  Vaping Use   Vaping status: Never Used  Substance Use Topics   Alcohol use: No    Alcohol/week: 0.0 standard drinks of alcohol   Drug use: No    ROS   Objective:   Vitals: BP (!) 141/75 (BP Location: Left Arm)   Pulse 79   Temp 98.4 F (36.9 C) (Oral)   Resp 20   SpO2 91%   Physical Exam Constitutional:      General: He is not in acute distress.    Appearance: Normal appearance. He is well-developed and normal weight. He is not ill-appearing, toxic-appearing or diaphoretic.  HENT:     Head: Normocephalic and atraumatic.     Right Ear: Tympanic membrane, ear canal and external  ear normal. No drainage, swelling or tenderness. No middle ear effusion. There is no impacted cerumen. Tympanic membrane is not erythematous or bulging.     Left Ear: Tympanic membrane, ear canal and external ear normal. No drainage, swelling or tenderness.  No middle ear effusion. There is no impacted cerumen. Tympanic membrane is not erythematous or bulging.     Nose: Congestion present. No rhinorrhea.     Mouth/Throat:     Mouth: Mucous membranes are moist.     Pharynx: Posterior oropharyngeal erythema present. No oropharyngeal exudate.     Comments: Thick streaks of post-nasal drainage overlying pharynx.  Eyes:     General: No scleral icterus.       Right eye: No discharge.        Left eye: No discharge.     Extraocular Movements: Extraocular movements intact.     Conjunctiva/sclera: Conjunctivae normal.    Cardiovascular:     Rate and Rhythm: Normal rate and regular rhythm.     Heart sounds: Normal heart sounds. No murmur heard.    No friction rub. No gallop.  Pulmonary:     Effort: Pulmonary effort is normal. No respiratory distress.     Breath sounds: Normal breath sounds. No stridor. No wheezing, rhonchi or rales.   Musculoskeletal:     Cervical back: Normal range of motion and neck supple. No rigidity. No muscular tenderness.   Neurological:     General: No focal deficit present.     Mental Status: He is alert and oriented to person, place, and time.   Psychiatric:        Mood and Affect: Mood normal.        Behavior:  Behavior normal.        Thought Content: Thought content normal.    Results for orders placed or performed during the hospital encounter of 08/07/23 (from the past 24 hours)  POCT CBG (manual entry)     Status: Abnormal   Collection Time: 08/07/23  6:44 PM  Result Value Ref Range   POCT Glucose (KUC) 115 (A) 70 - 99 mg/dL    Assessment and Plan :   PDMP not reviewed this encounter.  1. Bacterial upper respiratory infection   2. Acute cough     Creatinine clearance calculated at 56 mL/min - 16mL/min. X-ray over-read was pending at time of discharge, recommended follow up with only abnormal results. Otherwise will not call for negative over-read. Patient was in agreement.  No will start empiric treatment for bacterial upper respiratory infection with Augmentin.  Recommended supportive care otherwise. Counseled patient on potential for adverse effects with medications prescribed/recommended today, ER and return-to-clinic precautions discussed, patient verbalized understanding.    Christopher Savannah, PA-C 08/07/23 1927   UPDATE: DG Chest 2 View Result Date: 08/07/2023 CLINICAL DATA:  Productive cough and hypoxia. EXAM: CHEST - 2 VIEW COMPARISON:  08/06/2023 FINDINGS: The heart size and mediastinal contours are within normal limits. Airspace disease seen in the right middle lobe, suspicious for pneumonia. Left lung is clear. Surgical clips noted in left axilla. IMPRESSION: Right middle lobe airspace disease, suspicious for pneumonia. Recommend continued chest radiographic follow-up to confirm resolution. Electronically Signed   By: Norleen DELENA Kil M.D.   On: 08/07/2023 19:28    1. Pneumonia of right middle lobe due to infectious organism   2. Acute cough   3. Bacterial upper respiratory infection     Maintain the above treatment plan and will add in azithromycin  to cover for atypical organisms for management of his right middle lobe pneumonia.   Christopher Savannah, [place] 08/07/23 1932

## 2023-08-07 NOTE — Discharge Instructions (Signed)
 Will update your chest x-ray results as they become available which may end up being tomorrow. For now start amoxicillin-clavulanate for a bacterial upper respiratory infection. Will make changes based off of the x-ray report as needed. Use Tylenol  for aches and pains.

## 2023-08-07 NOTE — ED Triage Notes (Signed)
 Pt presents c/o productive cough and URI x 2 weeks. Pt saw a nurse in home today who advised he come in to UC to be treated.

## 2023-08-18 ENCOUNTER — Other Ambulatory Visit: Payer: Self-pay | Admitting: Family Medicine

## 2023-09-03 DIAGNOSIS — Z961 Presence of intraocular lens: Secondary | ICD-10-CM | POA: Diagnosis not present

## 2023-09-03 DIAGNOSIS — E119 Type 2 diabetes mellitus without complications: Secondary | ICD-10-CM | POA: Diagnosis not present

## 2023-09-03 DIAGNOSIS — H2512 Age-related nuclear cataract, left eye: Secondary | ICD-10-CM | POA: Diagnosis not present

## 2023-09-03 DIAGNOSIS — H40013 Open angle with borderline findings, low risk, bilateral: Secondary | ICD-10-CM | POA: Diagnosis not present

## 2023-09-24 ENCOUNTER — Ambulatory Visit: Admitting: Family Medicine

## 2023-09-28 NOTE — Progress Notes (Deleted)
 Cardiology Office Note    Patient Name: [name] Date of Encounter: 09/28/2023  Primary Care Provider:  [name], MD Primary Cardiologist:  [name] Parchment, MD Primary Electrophysiologist: None   Past Medical History    Past Medical History:  Diagnosis Date   Anxiety    Borderline hyperlipidemia    CAD (coronary artery disease)    Colon polyps    Complication of anesthesia    trouble urinating after anesthesia   Diverticulosis of colon    DJD (degenerative joint disease)    DM (diabetes mellitus) (HCC)    Adult onset  type 2   History of gastritis    History of kidney stones    surgery to remove   History of pyelonephritis    History of syncope    HTN (hypertension)    Hypertrophy of prostate with urinary obstruction and other lower urinary tract symptoms (LUTS)    Melanoma (HCC)    back of neck   Melanoma (HCC) 2022   per dermatology   Myoclonus    Pancreatitis due to biliary obstruction several years ago   treated by Dr. Princella Mathews    History of Present Illness  [name] is a 89 y.o. male with a PMH of HFrEF, NICM, DM type II, HTN, startle myoclonus HLD, BPH, anxiety who presents today for 80-month follow-up.  [name] was last seen on 06/23/2023 following previous hospitalization for influenza A and AKI.  During visit he reported doing well with no new cardiac complaints.  Patient's blood pressure was stable and patient was planned to begin PT OT at home.  He was noted to be euvolemic on exam and was continued on current GDMT as prescribed.   Patient denies chest pain, palpitations, dyspnea, PND, orthopnea, nausea, vomiting, dizziness, syncope, edema, weight gain, or early satiety.   Discussed the use of AI scribe software for clinical note transcription with the patient, who gave verbal consent to proceed.  History of Present Illness    ***Notes:   Review of Systems  Please see the history of present illness.    All other systems  reviewed and are otherwise negative except as noted above.  Physical Exam    Wt Readings from Last 3 Encounters:  07/23/23 214 lb (97.1 kg)  06/23/23 224 lb (101.6 kg)  06/16/23 223 lb (101.2 kg)   CD:Uyzmz were no vitals filed for this visit.,There is no height or weight on file to calculate BMI. GEN: Well nourished, well developed in no acute distress Neck: No JVD; No carotid bruits Pulmonary: Clear to auscultation without rales, wheezing or rhonchi  Cardiovascular: Normal rate. Regular rhythm. Normal S1. Normal S2.   Murmurs: There is no murmur.  ABDOMEN: Soft, non-tender, non-distended EXTREMITIES:  No edema; No deformity   EKG/LABS/ Recent Cardiac Studies   ECG personally reviewed by me today - ***  Risk Assessment/Calculations:   {Does this patient have ATRIAL FIBRILLATION?:[phone]}      Lab Results  Component Value Date   WBC 12.2 (H) 08/06/2023   HGB 12.9 (L) 08/06/2023   HCT 39.3 08/06/2023   MCV 91.6 08/06/2023   PLT 354 08/06/2023   Lab Results  Component Value Date   CREATININE 1.14 08/06/2023   BUN 19 08/06/2023   NA 135 08/06/2023   K 4.0 08/06/2023   CL 97 (L) 08/06/2023   CO2 25 08/06/2023   Lab Results  Component Value Date   CHOL 162 07/23/2022   HDL 32.70 (L)  07/23/2022   LDLCALC 88 01/27/2022   LDLDIRECT 103.0 07/23/2022   TRIG 284.0 (H) 07/23/2022   CHOLHDL 5 07/23/2022    Lab Results  Component Value Date   HGBA1C 8.7 (H) 06/16/2023   Assessment & Plan    Assessment and Plan Assessment & Plan     1.  Chronic combined CHF: - 2D echo completed 04/09/2023 showing EF of 30-40% global hypokinesis and mildly dilated LV and grade 2 DD - Patient is euvolemic on examination today and reports compliance with low-sodium diet - Continue current GDMT with carvedilol  12.5 mg twice daily, Lasix  40 mg daily with as needed 20 mg, Jardiance  10 mg daily hydralazine  50 mg 3 times daily, losartan  25 mg, potassium 20 mEq -Low sodium diet, fluid  restriction <2L, and daily weights encouraged. Educated to contact our office for weight gain of 2 lbs overnight or 5 lbs in one week.    2.  Essential hypertension: - Patient's blood pressure today was stable at 122/50 - Continue hydralazine  50 mg 3 times daily, Norvasc  10 mg daily, carvedilol  12.5 mg twice daily   3.  NICM: -2D echo completed 04/09/2023 showing EF of 30-40% global hypokinesis and mildly dilated LV and grade 2 DD - Continue guideline directed therapy as noted above   4.  DM type II: - Hemoglobin A1c was elevated at 8.7 - Continue Jardiance  10 mg, [name]  and Humalog  as prescribed by PCP      Disposition: Follow-up with [name] Parchment, MD or APP in *** months {Are you ordering a CV Procedure (e.g. stress test, cath, DCCV, TEE, etc)?   Press F2        :[id number]}   Signed, [name], [name] Shove, NP 09/28/2023, 1:23 PM [facility]

## 2023-09-29 ENCOUNTER — Encounter: Payer: Self-pay | Admitting: Family Medicine

## 2023-09-29 ENCOUNTER — Ambulatory Visit: Attending: Nurse Practitioner | Admitting: Nurse Practitioner

## 2023-09-29 ENCOUNTER — Ambulatory Visit (INDEPENDENT_AMBULATORY_CARE_PROVIDER_SITE_OTHER): Admitting: Family Medicine

## 2023-09-29 ENCOUNTER — Ambulatory Visit (INDEPENDENT_AMBULATORY_CARE_PROVIDER_SITE_OTHER)
Admission: RE | Admit: 2023-09-29 | Discharge: 2023-09-29 | Disposition: A | Discharge status: Home / Self Care | Source: Ambulatory Visit | Attending: Family Medicine | Admitting: Family Medicine

## 2023-09-29 VITALS — BP 126/68 | HR 80 | Temp 97.8°F | Ht 72.0 in | Wt 224.2 lb

## 2023-09-29 DIAGNOSIS — E1159 Type 2 diabetes mellitus with other circulatory complications: Secondary | ICD-10-CM | POA: Diagnosis not present

## 2023-09-29 DIAGNOSIS — Z8701 Personal history of pneumonia (recurrent): Secondary | ICD-10-CM | POA: Diagnosis not present

## 2023-09-29 DIAGNOSIS — F419 Anxiety disorder, unspecified: Secondary | ICD-10-CM

## 2023-09-29 DIAGNOSIS — M159 Polyosteoarthritis, unspecified: Secondary | ICD-10-CM

## 2023-09-29 DIAGNOSIS — Z7984 Long term (current) use of oral hypoglycemic drugs: Secondary | ICD-10-CM | POA: Diagnosis not present

## 2023-09-29 DIAGNOSIS — I700 Atherosclerosis of aorta: Secondary | ICD-10-CM | POA: Diagnosis not present

## 2023-09-29 DIAGNOSIS — J984 Other disorders of lung: Secondary | ICD-10-CM | POA: Diagnosis not present

## 2023-09-29 DIAGNOSIS — R42 Dizziness and giddiness: Secondary | ICD-10-CM

## 2023-09-29 DIAGNOSIS — R918 Other nonspecific abnormal finding of lung field: Secondary | ICD-10-CM | POA: Diagnosis not present

## 2023-09-29 DIAGNOSIS — R7309 Other abnormal glucose: Secondary | ICD-10-CM

## 2023-09-29 LAB — POCT GLYCOSYLATED HEMOGLOBIN (HGB A1C): Hemoglobin A1C: 8.8 % — AB (ref 4.0–5.6)

## 2023-09-29 MED ORDER — ONETOUCH ULTRA VI STRP: ORAL_STRIP | 2 refills | Status: DC

## 2023-09-29 MED ORDER — LANCETS MISC. MISC: 1.0000 | Freq: Three times a day (TID) | 0 refills | Status: AC

## 2023-09-29 MED ORDER — ESCITALOPRAM OXALATE 20 MG PO TABS: 20.0000 mg | ORAL_TABLET | Freq: Every day | ORAL | 1 refills | Status: AC

## 2023-09-29 MED ORDER — LANCET DEVICE MISC: 1.0000 | Freq: Three times a day (TID) | 0 refills | Status: AC

## 2023-09-29 MED ORDER — MELOXICAM 7.5 MG PO TABS: 7.5000 mg | ORAL_TABLET | Freq: Every day | ORAL | 1 refills | Status: DC

## 2023-09-29 MED ORDER — MECLIZINE HCL 12.5 MG PO TABS
12.5000 mg | ORAL_TABLET | Freq: Three times a day (TID) | ORAL | 1 refills | Status: DC | PRN
Start: 2023-09-29 — End: 2024-01-08

## 2023-09-29 MED ORDER — VITAMIN B-12 1000 MCG PO TABS: 1000.0000 ug | ORAL_TABLET | Freq: Every day | ORAL | Status: AC

## 2023-09-29 MED ORDER — TRAMADOL HCL 50 MG PO TABS: ORAL_TABLET | ORAL | 3 refills | Status: DC

## 2023-09-29 NOTE — Patient Instructions (Addendum)
 Please schedule your next visit as a yearly visit in about 4 months.  Labs at the visit.  You don't need to fast.   Try taking meloxicam  for joint pain.   Update me as needed.   I'll check on jardiance  help in the meantime.   Take care.  Glad to see you.

## 2023-09-29 NOTE — Progress Notes (Unsigned)
 Diabetes:  Using medications without difficulties: yes Hypoglycemic episodes: rare ,cautions d/w pt.  Hyperglycemic episodes: rare Feet problems:no Blood Sugars averaging: 170-200, rarely low, down to 154 this AM.   eye exam within last year: yes, Dr. Octavia.   He is not yet on jardiance .  Discussed that I will check with pharmacy about medication assistance.  Referral placed.  Had been on oral B12.   Discussed.  Med list updated.  Not on IM replacement.  Discussed rechecking level later on.  D/w pt about getting f/u xray done.  History of pneumonia noted.  D/w pt and son.  Mood improved on lexapro .  Rx sent.  No ADE on med.  Taking meclizine  usually every day prn with relief w/o ADE on med.  Refill sent.    He is still taking tramadol  as needed for joint/knee pain.  Tramadol  doesn't help much.  Discussed options.  PMH and SH reviewed  Meds, vitals, and allergies reviewed.   ROS: Per HPI unless specifically indicated in ROS section   GEN: nad, alert and oriented HEENT: ncat NECK: supple w/o LA CV: rrr. PULM: ctab, no inc wob ABD: soft, +bs EXT: no edema SKIN: no acute rash Chronic B knee OA changes.   Chronic IP joint changes B.    Diabetic foot exam: Normal inspection No skin breakdown No calluses  Normal DP pulses Normal sensation to light touch and monofilament Nails thickened.

## 2023-09-30 ENCOUNTER — Ambulatory Visit: Payer: Self-pay | Admitting: Family Medicine

## 2023-09-30 ENCOUNTER — Telehealth: Payer: Self-pay | Admitting: Family Medicine

## 2023-09-30 DIAGNOSIS — Z8701 Personal history of pneumonia (recurrent): Secondary | ICD-10-CM

## 2023-09-30 MED ORDER — POLYETHYLENE GLYCOL 3350 17 GM/SCOOP PO POWD
17.0000 g | Freq: Every day | ORAL | Status: DC | PRN
Start: 2023-09-30 — End: 2023-11-24

## 2023-09-30 NOTE — Telephone Encounter (Signed)
 Copied from CRM [id number]. Topic: Clinical - Prescription Issue >> Sep 30, 2023 12:21 PM Aleatha C wrote: Reason for CRM: Patient  son [name] called to let Dr. Cleatus know patient has been constipated since taking his medication and would like a call to get information on what his father may take to help call back [phone]

## 2023-09-30 NOTE — Assessment & Plan Note (Signed)
 Follow-up chest x-ray pending.  Lungs are clear.  See notes on imaging.

## 2023-09-30 NOTE — Assessment & Plan Note (Signed)
 No change in medications yet.  Recheck periodically.  Continue insulin  as is for now.  Goal to limit hypoglycemia risk.  He is not yet on jardiance .  Discussed that I will check with pharmacy about medication assistance.

## 2023-09-30 NOTE — Assessment & Plan Note (Signed)
 Continue Lexapro  as is.

## 2023-09-30 NOTE — Telephone Encounter (Signed)
 Would try miralax  OTC prn.  I added it to med list.  Thanks.

## 2023-09-30 NOTE — Assessment & Plan Note (Signed)
 He can try meloxicam  with food but not with other NSAIDs.  Can still use tramadol  with that if needed.  He can let me know if does not helping his knee pain.

## 2023-09-30 NOTE — Addendum Note (Signed)
 Addended by: [name] on: 09/30/2023 08:03 PM   Modules accepted: Orders

## 2023-10-01 ENCOUNTER — Telehealth: Payer: Self-pay

## 2023-10-01 ENCOUNTER — Encounter: Payer: Self-pay | Admitting: Family Medicine

## 2023-10-01 NOTE — Progress Notes (Unsigned)
 Care Guide Pharmacy Note  10/01/2023 Name: [name] MRN: [id number] DOB: [birth year]  Referred By: [name], MD Reason for referral: Complex Care Management and Call Attempt #1 (Unsuccessful initial outreach to schedule with PHARM D- Manuelita)   [name] is a 89 y.o. year old male who is a primary care patient of [name], MD.  [name] was referred to the pharmacist for assistance related to: DMII  An unsuccessful telephone outreach was attempted today to contact the patient who was referred to the pharmacy team for assistance with medication assistance. Additional attempts will be made to contact the patient.  [name] [facility], [facility] Guide  Direct Dial : [phone]  Fax [phone]

## 2023-10-01 NOTE — Telephone Encounter (Signed)
 Lvm asking pt/pt's son, [name] (on dpr), to call back. Plz relay Dr Elfredia message.

## 2023-10-02 NOTE — Telephone Encounter (Signed)
 Left voicemail for patients son to return call to office.

## 2023-10-02 NOTE — Progress Notes (Signed)
 Care Guide Pharmacy Note  10/02/2023 Name: [name] MRN: [id number] DOB: [birth year]  Referred By: [name], MD Reason for referral: Complex Care Management, Call Attempt #1 (Unsuccessful initial outreach to schedule with PHARM [name]), and Call Attempt #2 (Successful initial outreach scheduled with [name])   [name] is a 89 y.o. year old male who is a primary care patient of [name], MD.  [name] was referred to the pharmacist for assistance related to: DMII  Successful contact was made with the patient to discuss pharmacy services including being ready for the pharmacist to call at least 5 minutes before the scheduled appointment time and to have medication bottles and any blood pressure readings ready for review. The patient agreed to meet with the pharmacist via telephone visit on (date/time). 10/08/23 @ 1 pm  [name] [facility], [facility] Guide  Direct Dial : [phone]  Fax [phone]

## 2023-10-08 ENCOUNTER — Other Ambulatory Visit (INDEPENDENT_AMBULATORY_CARE_PROVIDER_SITE_OTHER)

## 2023-10-08 DIAGNOSIS — E119 Type 2 diabetes mellitus without complications: Secondary | ICD-10-CM

## 2023-10-08 NOTE — Progress Notes (Unsigned)
   10/08/2023 Name: [name] MRN: [id number] DOB: 03/14/1934  Subjective  No chief complaint on file.  Care Team: Primary Care Provider: [name], MD  Reason for visit: ?  [name] is a 89 y.o. male who presents today for a telephone visit with the pharmacist due to medication access concerns regarding Jardiance . ?   Medication Access: ?  Previous enrolled in the [employer] Cardiomyopathy fund to cover the cost of Jardiance .   Prescription drug coverage: YES Payor: Multimedia programmer / Plan: [facility] MEDICARE / Product Type: *No Product type* / .   Current Patient Assistance: None  Patient lives in a household of 1 with an estimated combined annual income of only social security retirement.   Assessment and Plan:   1. Medication Access Patient is not eligible for copay cards due to government insurance. Remains enrolled in the [facility] Cardiomyopathy grant which will cover the cost of CHF medications that are approved by his insurance.  Patient may qualify for patient assistance for other brand medications as needed in the future based on income. Would likely be eligible to receive Jardiance  or Farxiga and Entresto though would require application to program(s)  No active Rx on file at this time for Jardiance . eGFR up to date, >60 (08/06/23) Due for repeat BMP ~4 weeks after SGL2i start    [employer] - Initial enrollment 2025   Medication(s): All CHF medications    Application Status:  Approved   [employer]: ID [id number] Fund: Cardiomyopathy - Medicare Access Assistance Type: Co-pay Start Date: 06/02/2023 End Date: 05/31/2024               Rx Card: Card No.  [id number] RX BIN:  [id number] PCN:  [id number] Group:  [id number]     Manuelita FABIENE Kobs, PharmD Clinical Pharmacist [facility] [phone]   Future Appointments  Date Time Provider Department Center  10/08/2023  1:00 PM  [facility] PHARMACIST [facility] [address]  07/26/2024  1:00 PM [facility] ANNUAL WELLNESS VISIT 1 [facility] [address]   Manuelita FABIENE Kobs, PharmD Clinical Pharmacist [facility] [phone]

## 2023-10-08 NOTE — Telephone Encounter (Signed)
 Called patients son and reviewed all information. He verbalized understanding. Will call if any further questions.

## 2023-10-09 MED ORDER — EMPAGLIFLOZIN 10 MG PO TABS: 10.0000 mg | ORAL_TABLET | Freq: Every day | ORAL | 3 refills | Status: AC

## 2023-10-09 NOTE — Progress Notes (Signed)
 Noted. Thanks.  I signed the rx.

## 2023-10-23 ENCOUNTER — Encounter: Payer: Self-pay | Admitting: Family Medicine

## 2023-10-23 ENCOUNTER — Ambulatory Visit (INDEPENDENT_AMBULATORY_CARE_PROVIDER_SITE_OTHER): Admitting: Family Medicine

## 2023-10-23 VITALS — BP 138/78 | HR 75 | Temp 98.5°F | Ht 72.0 in | Wt 226.0 lb

## 2023-10-23 DIAGNOSIS — I152 Hypertension secondary to endocrine disorders: Secondary | ICD-10-CM | POA: Diagnosis not present

## 2023-10-23 DIAGNOSIS — E1159 Type 2 diabetes mellitus with other circulatory complications: Secondary | ICD-10-CM

## 2023-10-23 NOTE — Patient Instructions (Signed)
 I would cut amlodipine  back to 5mg  (1/2 of a 10mg  tab).  See if you are less lightheaded, more stable, and have less swelling.   Update me as needed.   Keep using your walker and keep a phone nearby.   Take care.  Glad to see you.

## 2023-10-23 NOTE — Progress Notes (Unsigned)
 Recent falls d/w pt.  He got up in the middle of the night to go to [facility], fell near the fireplace.  No LOC.  Son helped patient up, to a chair.  No syncope known.  Per patient, he could have been lightheaded.  He reportedly had some bruising on his back.  He was able get jardiance  with pharmacy help.   He has walker to use.  Didn't want to use an alert button.  He can keep a phone with him, in case of emergency, d/w pt.   Meds, vitals, and allergies reviewed.   ROS: Per HPI unless specifically indicated in ROS section   Nad Ncat Neck supple no LA Rrr Ctab Abdomen soft. He was lightheaded on standing at clinic.  Some BLE edema.   L lower back bruised.   Skin well-perfused.

## 2023-10-25 NOTE — Assessment & Plan Note (Signed)
 With blood pressure treatment potentially contributing to lightheadedness.  Fall cautions discussed with patient. I would cut amlodipine  back to 5mg  (1/2 of a 10mg  tab). I asked him to see if he is less lightheaded, more stable, and have less swelling with the lower dose of amlodipine . Update me as needed.  I asked him to keep using his walker and keep a phone nearby.

## 2023-11-05 ENCOUNTER — Telehealth: Payer: Self-pay | Admitting: Family Medicine

## 2023-11-05 NOTE — Telephone Encounter (Signed)
 Requesting Contour Meter, Test strips, and Lancets. Not on current med list

## 2023-11-05 NOTE — Telephone Encounter (Signed)
 Copied from CRM [id number]. Topic: Clinical - Medication Refill >> Nov 05, 2023  2:20 PM Burnard DEL wrote: Medication: Contour meter Contour test strips Contour lancets  Has the patient contacted their pharmacy? Yes (Agent: If no, request that the patient contact the pharmacy for the refill. If patient does not wish to contact the pharmacy document the reason why and proceed with request.) (Agent: If yes, when and what did the pharmacy advise?)  This is the patient's preferred pharmacy:    [facility] - [place], [place] - [address] [place]  [address] Phone: [phone] Fax: [phone]  Is this the correct pharmacy for this prescription? Yes If no, delete pharmacy and type the correct one.   Has the prescription been filled recently? No  Is the patient out of the medication? Yes  Has the patient been seen for an appointment in the last year OR does the patient have an upcoming appointment? Yes  Can we respond through MyChart? No  Agent: Please be advised that Rx refills may take up to 3 business days. We ask that you follow-up with your pharmacy.  **Patients insurance no longer covers one touch diabetic supplies,[facility] would like to have Contour brand prescriptions sent to them**

## 2023-11-08 NOTE — Telephone Encounter (Signed)
 Please send 90 day supplies with 3 rf.  He needs to be able to check his sugar up to 3 times per day.  Dx diabetes with insulin  use.  Thanks.

## 2023-11-09 ENCOUNTER — Other Ambulatory Visit: Payer: Self-pay

## 2023-11-09 MED ORDER — CONTOUR TEST VI STRP: ORAL_STRIP | 12 refills | Status: DC

## 2023-11-09 MED ORDER — CONTOUR BLOOD GLUCOSE SYSTEM W/DEVICE KIT: 1.0000 | PACK | Freq: Three times a day (TID) | 0 refills | Status: DC

## 2023-11-09 NOTE — Telephone Encounter (Signed)
 Sent. Left message

## 2023-11-12 ENCOUNTER — Other Ambulatory Visit: Payer: Self-pay | Admitting: Family Medicine

## 2023-11-13 NOTE — Telephone Encounter (Signed)
 I sent all medication except for: amlodipine  because I am not showing that was orginally done by you and the lisinopril  because it shows it was discontinued

## 2023-11-15 NOTE — Telephone Encounter (Signed)
 Please update patient/son.   Would not take lisinopril  with losartan .  I didn't sent lisinopril .   Did he feel better taking 5mg  amlodipine , down from 10mg ?  Please let me know.    If so, I can send new rx for 5mg  tabs so he will not have to cut the 10mg  tabs.  Thanks.

## 2023-11-16 ENCOUNTER — Emergency Department (HOSPITAL_COMMUNITY)

## 2023-11-16 ENCOUNTER — Inpatient Hospital Stay (HOSPITAL_COMMUNITY): Admitting: Anesthesiology

## 2023-11-16 ENCOUNTER — Inpatient Hospital Stay (HOSPITAL_COMMUNITY)
Admission: EM | Admit: 2023-11-16 | Discharge: 2023-11-24 | DRG: 481 | Disposition: A | Discharge status: Skilled Nursing Facility | Attending: Internal Medicine | Admitting: Internal Medicine

## 2023-11-16 ENCOUNTER — Encounter (HOSPITAL_COMMUNITY): Payer: Self-pay

## 2023-11-16 ENCOUNTER — Other Ambulatory Visit: Payer: Self-pay

## 2023-11-16 ENCOUNTER — Other Ambulatory Visit: Payer: Self-pay | Admitting: Orthopaedic Surgery

## 2023-11-16 DIAGNOSIS — Z8249 Family history of ischemic heart disease and other diseases of the circulatory system: Secondary | ICD-10-CM

## 2023-11-16 DIAGNOSIS — S2232XD Fracture of one rib, left side, subsequent encounter for fracture with routine healing: Secondary | ICD-10-CM | POA: Diagnosis not present

## 2023-11-16 DIAGNOSIS — E785 Hyperlipidemia, unspecified: Secondary | ICD-10-CM | POA: Diagnosis present

## 2023-11-16 DIAGNOSIS — I2510 Atherosclerotic heart disease of native coronary artery without angina pectoris: Secondary | ICD-10-CM | POA: Diagnosis not present

## 2023-11-16 DIAGNOSIS — I10 Essential (primary) hypertension: Secondary | ICD-10-CM | POA: Diagnosis not present

## 2023-11-16 DIAGNOSIS — Z7984 Long term (current) use of oral hypoglycemic drugs: Secondary | ICD-10-CM

## 2023-11-16 DIAGNOSIS — S72142A Displaced intertrochanteric fracture of left femur, initial encounter for closed fracture: Principal | ICD-10-CM | POA: Diagnosis present

## 2023-11-16 DIAGNOSIS — Z743 Need for continuous supervision: Secondary | ICD-10-CM | POA: Diagnosis not present

## 2023-11-16 DIAGNOSIS — K5730 Diverticulosis of large intestine without perforation or abscess without bleeding: Secondary | ICD-10-CM | POA: Diagnosis not present

## 2023-11-16 DIAGNOSIS — G8918 Other acute postprocedural pain: Secondary | ICD-10-CM | POA: Diagnosis not present

## 2023-11-16 DIAGNOSIS — S72002A Fracture of unspecified part of neck of left femur, initial encounter for closed fracture: Secondary | ICD-10-CM | POA: Diagnosis present

## 2023-11-16 DIAGNOSIS — N179 Acute kidney failure, unspecified: Secondary | ICD-10-CM | POA: Diagnosis not present

## 2023-11-16 DIAGNOSIS — E66811 Obesity, class 1: Secondary | ICD-10-CM | POA: Diagnosis present

## 2023-11-16 DIAGNOSIS — I5042 Chronic combined systolic (congestive) and diastolic (congestive) heart failure: Secondary | ICD-10-CM | POA: Diagnosis not present

## 2023-11-16 DIAGNOSIS — Z7982 Long term (current) use of aspirin: Secondary | ICD-10-CM

## 2023-11-16 DIAGNOSIS — M170 Bilateral primary osteoarthritis of knee: Secondary | ICD-10-CM | POA: Diagnosis present

## 2023-11-16 DIAGNOSIS — Z885 Allergy status to narcotic agent status: Secondary | ICD-10-CM

## 2023-11-16 DIAGNOSIS — S3013XA Contusion of flank (latus) region, initial encounter: Secondary | ICD-10-CM | POA: Diagnosis present

## 2023-11-16 DIAGNOSIS — E1165 Type 2 diabetes mellitus with hyperglycemia: Secondary | ICD-10-CM | POA: Diagnosis present

## 2023-11-16 DIAGNOSIS — R0781 Pleurodynia: Secondary | ICD-10-CM | POA: Diagnosis not present

## 2023-11-16 DIAGNOSIS — N401 Enlarged prostate with lower urinary tract symptoms: Secondary | ICD-10-CM | POA: Diagnosis present

## 2023-11-16 DIAGNOSIS — R9082 White matter disease, unspecified: Secondary | ICD-10-CM | POA: Diagnosis not present

## 2023-11-16 DIAGNOSIS — R531 Weakness: Secondary | ICD-10-CM | POA: Diagnosis not present

## 2023-11-16 DIAGNOSIS — Z79899 Other long term (current) drug therapy: Secondary | ICD-10-CM | POA: Diagnosis not present

## 2023-11-16 DIAGNOSIS — N138 Other obstructive and reflux uropathy: Secondary | ICD-10-CM | POA: Diagnosis not present

## 2023-11-16 DIAGNOSIS — Z6830 Body mass index (BMI) 30.0-30.9, adult: Secondary | ICD-10-CM

## 2023-11-16 DIAGNOSIS — Z7401 Bed confinement status: Secondary | ICD-10-CM | POA: Diagnosis not present

## 2023-11-16 DIAGNOSIS — S199XXA Unspecified injury of neck, initial encounter: Secondary | ICD-10-CM | POA: Diagnosis not present

## 2023-11-16 DIAGNOSIS — Z85820 Personal history of malignant melanoma of skin: Secondary | ICD-10-CM | POA: Diagnosis not present

## 2023-11-16 DIAGNOSIS — I110 Hypertensive heart disease with heart failure: Secondary | ICD-10-CM | POA: Diagnosis not present

## 2023-11-16 DIAGNOSIS — Z4789 Encounter for other orthopedic aftercare: Secondary | ICD-10-CM | POA: Diagnosis not present

## 2023-11-16 DIAGNOSIS — K5900 Constipation, unspecified: Secondary | ICD-10-CM | POA: Diagnosis not present

## 2023-11-16 DIAGNOSIS — W19XXXA Unspecified fall, initial encounter: Secondary | ICD-10-CM | POA: Diagnosis not present

## 2023-11-16 DIAGNOSIS — Z9181 History of falling: Secondary | ICD-10-CM | POA: Diagnosis not present

## 2023-11-16 DIAGNOSIS — I152 Hypertension secondary to endocrine disorders: Secondary | ICD-10-CM | POA: Diagnosis present

## 2023-11-16 DIAGNOSIS — I5043 Acute on chronic combined systolic (congestive) and diastolic (congestive) heart failure: Secondary | ICD-10-CM

## 2023-11-16 DIAGNOSIS — E876 Hypokalemia: Secondary | ICD-10-CM | POA: Diagnosis present

## 2023-11-16 DIAGNOSIS — R1313 Dysphagia, pharyngeal phase: Secondary | ICD-10-CM | POA: Diagnosis not present

## 2023-11-16 DIAGNOSIS — R54 Age-related physical debility: Secondary | ICD-10-CM | POA: Diagnosis not present

## 2023-11-16 DIAGNOSIS — S2242XA Multiple fractures of ribs, left side, initial encounter for closed fracture: Secondary | ICD-10-CM | POA: Diagnosis present

## 2023-11-16 DIAGNOSIS — D62 Acute posthemorrhagic anemia: Secondary | ICD-10-CM | POA: Diagnosis not present

## 2023-11-16 DIAGNOSIS — E1159 Type 2 diabetes mellitus with other circulatory complications: Secondary | ICD-10-CM | POA: Diagnosis present

## 2023-11-16 DIAGNOSIS — F32A Depression, unspecified: Secondary | ICD-10-CM | POA: Diagnosis present

## 2023-11-16 DIAGNOSIS — M6281 Muscle weakness (generalized): Secondary | ICD-10-CM | POA: Diagnosis not present

## 2023-11-16 DIAGNOSIS — M1990 Unspecified osteoarthritis, unspecified site: Secondary | ICD-10-CM | POA: Diagnosis not present

## 2023-11-16 DIAGNOSIS — S72002D Fracture of unspecified part of neck of left femur, subsequent encounter for closed fracture with routine healing: Secondary | ICD-10-CM | POA: Diagnosis not present

## 2023-11-16 DIAGNOSIS — T148XXA Other injury of unspecified body region, initial encounter: Secondary | ICD-10-CM | POA: Diagnosis not present

## 2023-11-16 DIAGNOSIS — E119 Type 2 diabetes mellitus without complications: Secondary | ICD-10-CM | POA: Diagnosis not present

## 2023-11-16 DIAGNOSIS — Z794 Long term (current) use of insulin: Secondary | ICD-10-CM

## 2023-11-16 DIAGNOSIS — R079 Chest pain, unspecified: Secondary | ICD-10-CM | POA: Diagnosis not present

## 2023-11-16 DIAGNOSIS — R9389 Abnormal findings on diagnostic imaging of other specified body structures: Secondary | ICD-10-CM | POA: Diagnosis not present

## 2023-11-16 DIAGNOSIS — M25552 Pain in left hip: Secondary | ICD-10-CM | POA: Diagnosis not present

## 2023-11-16 DIAGNOSIS — N2889 Other specified disorders of kidney and ureter: Secondary | ICD-10-CM | POA: Diagnosis present

## 2023-11-16 DIAGNOSIS — F419 Anxiety disorder, unspecified: Secondary | ICD-10-CM | POA: Diagnosis not present

## 2023-11-16 DIAGNOSIS — R2689 Other abnormalities of gait and mobility: Secondary | ICD-10-CM | POA: Diagnosis not present

## 2023-11-16 DIAGNOSIS — S3011XA Contusion of abdominal wall, initial encounter: Secondary | ICD-10-CM | POA: Diagnosis present

## 2023-11-16 DIAGNOSIS — I700 Atherosclerosis of aorta: Secondary | ICD-10-CM | POA: Diagnosis not present

## 2023-11-16 DIAGNOSIS — R7989 Other specified abnormal findings of blood chemistry: Secondary | ICD-10-CM | POA: Diagnosis present

## 2023-11-16 DIAGNOSIS — R2681 Unsteadiness on feet: Secondary | ICD-10-CM | POA: Diagnosis not present

## 2023-11-16 DIAGNOSIS — S72002S Fracture of unspecified part of neck of left femur, sequela: Secondary | ICD-10-CM | POA: Diagnosis not present

## 2023-11-16 DIAGNOSIS — S0990XA Unspecified injury of head, initial encounter: Secondary | ICD-10-CM | POA: Diagnosis not present

## 2023-11-16 DIAGNOSIS — S2232XA Fracture of one rib, left side, initial encounter for closed fracture: Secondary | ICD-10-CM | POA: Diagnosis not present

## 2023-11-16 LAB — BASIC METABOLIC PANEL WITH GFR
Anion gap: 15 (ref 5–15)
BUN: 42 mg/dL — ABNORMAL HIGH (ref 8–23)
CO2: 17 mmol/L — ABNORMAL LOW (ref 22–32)
Calcium: 9.8 mg/dL (ref 8.9–10.3)
Chloride: 101 mmol/L (ref 98–111)
Creatinine, Ser: 1.53 mg/dL — ABNORMAL HIGH (ref 0.61–1.24)
GFR, Estimated: 44 mL/min — ABNORMAL LOW (ref >60)
Glucose, Bld: 380 mg/dL — ABNORMAL HIGH (ref 70–99)
Potassium: 3.4 mmol/L — ABNORMAL LOW (ref 3.5–5.1)
Sodium: 134 mmol/L — ABNORMAL LOW (ref 135–145)

## 2023-11-16 LAB — CBC WITH DIFFERENTIAL/PLATELET
Abs Immature Granulocytes: 0.1 K/uL — ABNORMAL HIGH (ref 0.00–0.07)
Basophils Absolute: 0.1 K/uL (ref 0.0–0.1)
Basophils Relative: 1 %
Eosinophils Absolute: 0.2 K/uL (ref 0.0–0.5)
Eosinophils Relative: 2 %
HCT: 44.3 % (ref 39.0–52.0)
Hemoglobin: 14.6 g/dL (ref 13.0–17.0)
Immature Granulocytes: 1 %
Lymphocytes Relative: 15 %
Lymphs Abs: 2.2 K/uL (ref 0.7–4.0)
MCH: 30.4 pg (ref 26.0–34.0)
MCHC: 33 g/dL (ref 30.0–36.0)
MCV: 92.1 fL (ref 80.0–100.0)
Monocytes Absolute: 0.8 K/uL (ref 0.1–1.0)
Monocytes Relative: 6 %
Neutro Abs: 10.8 K/uL — ABNORMAL HIGH (ref 1.7–7.7)
Neutrophils Relative %: 75 %
Platelets: 307 K/uL (ref 150–400)
RBC: 4.81 MIL/uL (ref 4.22–5.81)
RDW: 13.3 % (ref 11.5–15.5)
WBC: 14.2 K/uL — ABNORMAL HIGH (ref 4.0–10.5)
nRBC: 0 % (ref 0.0–0.2)

## 2023-11-16 LAB — TYPE AND SCREEN
ABO/RH(D): O POS
Antibody Screen: NEGATIVE

## 2023-11-16 LAB — CBG MONITORING, ED: Glucose-Capillary: 350 mg/dL — ABNORMAL HIGH (ref 70–99)

## 2023-11-16 LAB — GLUCOSE, CAPILLARY
Glucose-Capillary: 333 mg/dL — ABNORMAL HIGH (ref 70–99)
Glucose-Capillary: 280 mg/dL — ABNORMAL HIGH (ref 70–99)
Glucose-Capillary: 211 mg/dL — ABNORMAL HIGH (ref 70–99)

## 2023-11-16 LAB — ABO/RH: ABO/RH(D): O POS

## 2023-11-16 MED ORDER — ONDANSETRON HCL 4 MG/2ML IJ SOLN
4.0000 mg | Freq: Four times a day (QID) | INTRAMUSCULAR | Status: DC | PRN
  Administered 2023-11-16 – 2023-11-20 (×7): 4 mg via INTRAVENOUS
  Filled 2023-11-16 (×7): qty 2

## 2023-11-16 MED ORDER — ACETAMINOPHEN 650 MG RE SUPP: 650.0000 mg | Freq: Four times a day (QID) | RECTAL | Status: DC | PRN

## 2023-11-16 MED ORDER — TAMSULOSIN HCL 0.4 MG PO CAPS
0.4000 mg | ORAL_CAPSULE | Freq: Every day | ORAL | Status: DC
  Administered 2023-11-16 – 2023-11-24 (×9): 0.4 mg via ORAL
  Filled 2023-11-16 (×9): qty 1

## 2023-11-16 MED ORDER — AMLODIPINE BESYLATE 5 MG PO TABS
5.0000 mg | ORAL_TABLET | Freq: Every day | ORAL | Status: DC
  Administered 2023-11-16 – 2023-11-20 (×5): 5 mg via ORAL
  Filled 2023-11-16 (×5): qty 1

## 2023-11-16 MED ORDER — MECLIZINE HCL 25 MG PO TABS: 12.5000 mg | ORAL_TABLET | Freq: Three times a day (TID) | ORAL | Status: DC | PRN

## 2023-11-16 MED ORDER — BUPIVACAINE-EPINEPHRINE (PF) 0.5% -1:200000 IJ SOLN
INTRAMUSCULAR | Status: DC | PRN
  Administered 2023-11-16: 30 mL via PERINEURAL

## 2023-11-16 MED ORDER — POLYETHYLENE GLYCOL 3350 17 G PO PACK
17.0000 g | PACK | Freq: Every day | ORAL | Status: DC
  Administered 2023-11-16: 17 g via ORAL
  Filled 2023-11-16: qty 1

## 2023-11-16 MED ORDER — HYDROMORPHONE HCL 1 MG/ML IJ SOLN
0.5000 mg | Freq: Once | INTRAMUSCULAR | Status: AC
  Administered 2023-11-16: 0.5 mg via INTRAVENOUS
  Filled 2023-11-16: qty 1

## 2023-11-16 MED ORDER — ASPIRIN 81 MG PO TBEC: 81.0000 mg | DELAYED_RELEASE_TABLET | Freq: Every evening | ORAL | Status: DC

## 2023-11-16 MED ORDER — CARVEDILOL 12.5 MG PO TABS
12.5000 mg | ORAL_TABLET | Freq: Two times a day (BID) | ORAL | Status: DC
  Administered 2023-11-16 – 2023-11-24 (×17): 12.5 mg via ORAL
  Filled 2023-11-16 (×17): qty 1

## 2023-11-16 MED ORDER — EMPAGLIFLOZIN 10 MG PO TABS
10.0000 mg | ORAL_TABLET | Freq: Every day | ORAL | Status: DC
  Administered 2023-11-17: 10 mg via ORAL
  Filled 2023-11-16: qty 1

## 2023-11-16 MED ORDER — OXYCODONE HCL 5 MG PO TABS
5.0000 mg | ORAL_TABLET | ORAL | Status: DC | PRN
  Administered 2023-11-16 – 2023-11-24 (×16): 5 mg via ORAL
  Filled 2023-11-16 (×16): qty 1

## 2023-11-16 MED ORDER — SODIUM CHLORIDE 0.9 % IV BOLUS: 500.0000 mL | Freq: Once | INTRAVENOUS | Status: DC

## 2023-11-16 MED ORDER — INSULIN GLARGINE 100 UNIT/ML ~~LOC~~ SOLN
16.0000 [IU] | Freq: Every day | SUBCUTANEOUS | Status: DC
Start: 2023-11-16 — End: 2023-11-18
  Administered 2023-11-16: 16 [IU] via SUBCUTANEOUS
  Filled 2023-11-16 (×3): qty 0.16

## 2023-11-16 MED ORDER — VITAMIN B-12 1000 MCG PO TABS
1000.0000 ug | ORAL_TABLET | Freq: Every day | ORAL | Status: DC
  Administered 2023-11-16 – 2023-11-24 (×9): 1000 ug via ORAL
  Filled 2023-11-16 (×9): qty 1

## 2023-11-16 MED ORDER — SODIUM CHLORIDE (PF) 0.9 % IJ SOLN
INTRAMUSCULAR | Status: AC
  Filled 2023-11-16: qty 50

## 2023-11-16 MED ORDER — INSULIN GLARGINE (1 UNIT DIAL) 300 UNIT/ML ~~LOC~~ SOPN: 16.0000 [IU] | PEN_INJECTOR | Freq: Every day | SUBCUTANEOUS | Status: DC

## 2023-11-16 MED ORDER — HYDRALAZINE HCL 50 MG PO TABS
50.0000 mg | ORAL_TABLET | Freq: Three times a day (TID) | ORAL | Status: DC
  Administered 2023-11-16 – 2023-11-24 (×24): 50 mg via ORAL
  Filled 2023-11-16 (×25): qty 1

## 2023-11-16 MED ORDER — IOHEXOL 300 MG/ML  SOLN
75.0000 mL | Freq: Once | INTRAMUSCULAR | Status: AC | PRN
  Administered 2023-11-16: 75 mL via INTRAVENOUS

## 2023-11-16 MED ORDER — ONDANSETRON HCL 4 MG PO TABS: 4.0000 mg | ORAL_TABLET | Freq: Four times a day (QID) | ORAL | Status: DC | PRN

## 2023-11-16 MED ORDER — POTASSIUM CHLORIDE CRYS ER 20 MEQ PO TBCR
40.0000 meq | EXTENDED_RELEASE_TABLET | Freq: Once | ORAL | Status: AC
  Administered 2023-11-16: 40 meq via ORAL
  Filled 2023-11-16: qty 2

## 2023-11-16 MED ORDER — HYDROMORPHONE HCL 1 MG/ML IJ SOLN
0.5000 mg | INTRAMUSCULAR | Status: DC | PRN
  Administered 2023-11-16 – 2023-11-24 (×6): 0.5 mg via INTRAVENOUS
  Filled 2023-11-16 (×7): qty 0.5

## 2023-11-16 MED ORDER — INSULIN ASPART 100 UNIT/ML IJ SOLN
0.0000 [IU] | Freq: Three times a day (TID) | INTRAMUSCULAR | Status: DC
  Administered 2023-11-16: 11 [IU] via SUBCUTANEOUS
  Administered 2023-11-16: 15 [IU] via SUBCUTANEOUS
  Administered 2023-11-17: 4 [IU] via SUBCUTANEOUS
  Administered 2023-11-17 – 2023-11-18 (×2): 7 [IU] via SUBCUTANEOUS
  Administered 2023-11-18: 11 [IU] via SUBCUTANEOUS
  Administered 2023-11-18: 4 [IU] via SUBCUTANEOUS
  Administered 2023-11-19: 11 [IU] via SUBCUTANEOUS
  Administered 2023-11-19: 4 [IU] via SUBCUTANEOUS
  Administered 2023-11-19: 11 [IU] via SUBCUTANEOUS
  Administered 2023-11-20: 4 [IU] via SUBCUTANEOUS
  Administered 2023-11-20: 7 [IU] via SUBCUTANEOUS
  Administered 2023-11-20: 11 [IU] via SUBCUTANEOUS
  Administered 2023-11-21: 4 [IU] via SUBCUTANEOUS
  Administered 2023-11-21 (×2): 7 [IU] via SUBCUTANEOUS
  Administered 2023-11-22: 4 [IU] via SUBCUTANEOUS
  Administered 2023-11-22: 11 [IU] via SUBCUTANEOUS
  Administered 2023-11-22 – 2023-11-24 (×6): 7 [IU] via SUBCUTANEOUS
  Administered 2023-11-24: 3 [IU] via SUBCUTANEOUS
  Filled 2023-11-16: qty 0.2

## 2023-11-16 MED ORDER — ESCITALOPRAM OXALATE 20 MG PO TABS
20.0000 mg | ORAL_TABLET | Freq: Every day | ORAL | Status: DC
Start: 2023-11-16 — End: 2023-11-24
  Administered 2023-11-16 – 2023-11-24 (×9): 20 mg via ORAL
  Filled 2023-11-16 (×3): qty 1
  Filled 2023-11-16: qty 2
  Filled 2023-11-16 (×5): qty 1

## 2023-11-16 MED ORDER — SODIUM CHLORIDE 0.45 % IV SOLN
INTRAVENOUS | Status: DC
Start: 2023-11-16 — End: 2023-11-17

## 2023-11-16 MED ORDER — FENTANYL CITRATE PF 50 MCG/ML IJ SOSY
PREFILLED_SYRINGE | INTRAMUSCULAR | Status: AC
  Administered 2023-11-16: 50 ug via INTRAVENOUS
  Filled 2023-11-16: qty 1

## 2023-11-16 MED ORDER — ONDANSETRON HCL 4 MG/2ML IJ SOLN
4.0000 mg | Freq: Once | INTRAMUSCULAR | Status: AC
  Administered 2023-11-16: 4 mg via INTRAVENOUS
  Filled 2023-11-16: qty 2

## 2023-11-16 MED ORDER — ACETAMINOPHEN 325 MG PO TABS
650.0000 mg | ORAL_TABLET | Freq: Four times a day (QID) | ORAL | Status: DC | PRN
  Administered 2023-11-19 – 2023-11-20 (×2): 650 mg via ORAL
  Filled 2023-11-16 (×2): qty 2

## 2023-11-16 MED ORDER — FENTANYL CITRATE PF 50 MCG/ML IJ SOSY: 50.0000 ug | PREFILLED_SYRINGE | INTRAMUSCULAR | Status: DC

## 2023-11-16 MED ORDER — SODIUM CHLORIDE 0.9 % IV BOLUS
1000.0000 mL | Freq: Once | INTRAVENOUS | Status: AC
  Administered 2023-11-16: 1000 mL via INTRAVENOUS

## 2023-11-16 NOTE — Anesthesia Procedure Notes (Signed)
 Anesthesia Regional Block: Femoral nerve block   Pre-Anesthetic Checklist: , timeout performed,  Correct Patient, Correct Site, Correct Laterality,  Correct Procedure, Correct Position, site marked,  Risks and benefits discussed,  Surgical consent,  Pre-op evaluation,  At surgeon's request and post-op pain management  Laterality: Left  Prep: chloraprep       Needles:  Injection technique: Single-shot  Needle Type: Echogenic Needle     Needle Length: 9cm  Needle Gauge: 21     Additional Needles:   Procedures:,,,, ultrasound used (permanent image in chart),,    Narrative:  Start time: 11/16/2023 2:36 PM End time: 11/16/2023 2:42 PM Injection made incrementally with aspirations every 5 mL.  Performed by: Personally  Anesthesiologist: Leonce Athens, MD  Additional Notes: Pt identified in Holding room.  Monitors applied. Working IV access confirmed. TImeout, Sterile prep L groin.  #21ga ECHOgenic Arrow block needle near femoral nerve with US  guidance.  30cc 0.5% Bupivacaine  1:200k epi injected incrementally after negative test dose.  Patient asymptomatic, VSS, no heme aspirated, tolerated well.   [name] Leonce, MD

## 2023-11-16 NOTE — Telephone Encounter (Signed)
 Reached out to patient and spoke with son. Patient is currently admitted to the hospital due to a fall. Broken hip and rib. Therefore I did not review information about medication

## 2023-11-16 NOTE — Anesthesia Preprocedure Evaluation (Signed)
 Anesthesia Evaluation  Patient identified by MRN, date of birth, ID band Patient awake    Reviewed: Allergy & Precautions, NPO status , Patient's Chart, lab work & pertinent test results  History of Anesthesia Complications (+) history of anesthetic complications  Airway Mallampati: II  TM Distance: >3 FB     Dental no notable dental hx.    Pulmonary neg COPD   breath sounds clear to auscultation       Cardiovascular hypertension, (-) angina + CAD and +CHF  (-) Cardiac Stents and (-) CABG  Rhythm:Regular Rate:Normal  03/2023 TTE  1. Left ventricular ejection fraction, by estimation, is 35 to 40%. The  left ventricle has moderately decreased function. The left ventricle  demonstrates global hypokinesis. The left ventricular internal cavity size  was mildly dilated. Left ventricular  diastolic parameters are consistent with Grade II diastolic dysfunction  (pseudonormalization).   2. Right ventricular systolic function is normal. The right ventricular  size is moderately enlarged. Tricuspid regurgitation signal is inadequate  for assessing PA pressure.   3. Left atrial size was mildly dilated.   4. The mitral valve is normal in structure. Trivial mitral valve  regurgitation. No evidence of mitral stenosis.   5. The aortic valve was not well visualized. Aortic valve regurgitation  is not visualized. No aortic stenosis is present.   6. The inferior vena cava is dilated in size with <50% respiratory  variability, suggesting right atrial pressure of 15 mmHg.   7. Aortic dilatation noted. There is mild dilatation of the aortic root,  measuring 40 mm, but   8. Within normal limits for age when indexed to body surface area.      Neuro/Psych neg Seizures PSYCHIATRIC DISORDERS Anxiety      Neuromuscular disease    GI/Hepatic ,,,(+) neg Cirrhosis        Endo/Other  diabetes, Type 2    Renal/GU ARFRenal diseaseLab Results       Component                Value               Date                                K                        3.4 (L)             11/16/2023                   BUN                      42 (H)              11/16/2023                CREATININE               1.53 (H)            11/16/2023                    GLUCOSE                  380 (H)             11/16/2023  Musculoskeletal  (+) Arthritis ,    Abdominal   Peds  Hematology Lab Results      Component                Value               Date                      WBC                      14.2 (H)            11/16/2023                HGB                      14.6                11/16/2023                HCT                      44.3                11/16/2023                      PLT                      307                 11/16/2023              Anesthesia Other Findings All: Morphine , atorvastatin , codeine, metformin   Reproductive/Obstetrics                              Anesthesia Physical Anesthesia Plan  ASA: 3  Anesthesia Plan: Spinal   Post-op Pain Management:    Induction:   PONV Risk Score and Plan: 2 and Propofol  infusion and Treatment may vary due to age or medical condition  Airway Management Planned: Natural Airway and Nasal Cannula  Additional Equipment: None  Intra-op Plan:   Post-operative Plan: Extubation in OR  Informed Consent: I have reviewed the patients History and Physical, chart, labs and discussed the procedure including the risks, benefits and alternatives for the proposed anesthesia with the patient or authorized representative who has indicated his/her understanding and acceptance.     Dental advisory given  Plan Discussed with: CRNA and Surgeon  Anesthesia Plan Comments:          Anesthesia Quick Evaluation

## 2023-11-16 NOTE — Consult Note (Signed)
 Maude Herald, MD    Prentice Earl, PA-C                                  [facility]                [address] [place], [place]  [address]   ORTHOPAEDIC CONSULTATION  [name]            MRN:  [id number] DOB/SEX:  Jan 08, 1935/male     CHIEF COMPLAINT:  Painful left hip.  Fell at home this morninig.  No antecedant CP or dizziness  HISTORY: [name] a 89 y.o. male with recent fall.  EDP consulted about hip fracture in man who normally walks with walker and has history of knee DJD bilat   PAST MEDICAL HISTORY: Patient Active Problem List   Diagnosis Date Noted   Closed left hip fracture (HCC) 11/16/2023   History of pneumonia 09/30/2023   CHF (congestive heart failure) (HCC) 06/10/2023   Acute on chronic combined systolic and diastolic heart failure (HCC) 06/08/2023   Obesity, class 1 04/11/2023   Acute respiratory failure with hypoxia (HCC) 04/09/2023   Acute on chronic systolic CHF (congestive heart failure) (HCC) 04/09/2023   Psychogenic nonepileptic seizure 02/01/2023   Obesity (BMI 30-39.9) 01/23/2023   Memory change 07/31/2022   Drug-induced myopathy 07/28/2021   Tachycardia 01/09/2021   DNR (do not resuscitate) 04/13/2020   Hematoma 04/05/2020   Epigastric abdominal pain 04/01/2020   History of melanoma 2022   Medicare annual wellness visit, subsequent 01/03/2019   Recurrent Epistaxis 03/02/2017   Vitamin B 12 deficiency 08/17/2015   Elevated TSH 10/06/2014   Unsteady gait 09/18/2014   Midline low back pain without sciatica 09/18/2014   Tremor 05/04/2014   Chronic combined systolic and diastolic congestive heart failure (HCC) 01/27/2014   Ataxia 01/10/2014   UTI (urinary tract infection) 01/02/2014   History of coronary artery disease    Hypokalemia    Chest pain 12/15/2013   Healthcare maintenance 11/10/2013   Advance care planning 11/10/2013   ED (erectile dysfunction) 06/24/2013   HLD (hyperlipidemia) 03/22/2012    Type 2 diabetes mellitus with vascular disease (HCC) 03/22/2012   Thyroid  cyst 03/19/2011   BPH with obstruction/lower urinary tract symptoms 03/15/2010   GASTRITIS 02/21/2007   Anxiety 02/15/2007   CAD (coronary artery disease) 02/15/2007   DIVERTICULOSIS OF COLON 02/15/2007   COLONIC POLYPS 02/12/2007   Hypertension associated with diabetes (HCC) 02/12/2007   RENAL CALCULUS 02/12/2007   Osteoarthritis 02/12/2007   Past Medical History:  Diagnosis Date   Anxiety    Borderline hyperlipidemia    CAD (coronary artery disease)    Colon polyps    Complication of anesthesia    trouble urinating after anesthesia   Diverticulosis of colon    DJD (degenerative joint disease)    DM (diabetes mellitus) (HCC)    Adult onset  type 2   History of gastritis    History of kidney stones    surgery to remove   History of pyelonephritis    History of syncope    HTN (hypertension)    Hypertrophy of prostate with urinary obstruction and other lower urinary tract symptoms (LUTS)    Melanoma (HCC)    back of neck   Melanoma (HCC) 2022   per dermatology   Myoclonus    Pancreatitis due to biliary obstruction (HCC) several years ago  treated by Dr. Princella Nida   Past Surgical History:  Procedure Laterality Date   APPENDECTOMY     cartarized  nose blood vessel     left side   CATARACT EXTRACTION     RIGHT EYE   CHOLECYSTECTOMY     cystoscopy  04/17/11   stent placed   CYSTOSCOPY W/ URETERAL STENT PLACEMENT  04/17/2011   Procedure: CYSTOSCOPY WITH RETROGRADE PYELOGRAM/URETERAL STENT PLACEMENT;  Surgeon: Thomasine Oiler, MD;  Location: [facility];  Service: Urology;  Laterality: Left;   CYSTOSCOPY WITH RETROGRADE PYELOGRAM, URETEROSCOPY AND STENT PLACEMENT Left 04/18/2013   Procedure: CYSTOSCOPY WITH LEFT RETROGRADE PYELOGRAM, LEFT URETEROSCOPY AND LASER LITHOTRIPSY LEFT STENT PLACEMENT;  Surgeon: Noretta Ferrara, MD;  Location: [facility];  Service: Urology;  Laterality: Left;   HOLMIUM LASER APPLICATION  Left 04/18/2013   Procedure: HOLMIUM LASER APPLICATION;  Surgeon: Noretta Ferrara, MD;  Location: [facility];  Service: Urology;  Laterality: Left;   INCISION AND DRAINAGE ABSCESS Left 04/04/2020   Procedure: SELBY OF LEFT ARM HEMATOMA;  Surgeon: Aron Shoulders, MD;  Location: [facility];  Service: General;  Laterality: Left;  left   IRRIGATION AND DEBRIDEMENT ABSCESS Left 10/05/2014   Procedure: WILHEMENA D LEFT INDEX FINGER;  Surgeon: Balinda Rogue, MD;  Location: [facility];  Service: Plastics;  Laterality: Left;   KNEE SURGERY     bilateral ARTHROSCOPY X2 TO EACH KNEE   MELANOMA EXCISION  2018   MELANOMA EXCISION WITH SENTINEL LYMPH NODE BIOPSY Left 03/29/2020   Procedure: WIDE LOCAL EXCISION, ADVANCED FLAP CLOSURE LEFT ARM MELANOMA DEFECT WITH SENTINEL LYMPH NODE MAPPING AND BIOPSY;  Surgeon: Aron Shoulders, MD;  Location: [facility];  Service: General;  Laterality: Left;   S/P cysto & stents for kidnedy stone 11/08 by Dr. Amiel     S/P ELap 1986 w/excision of leiomyoma @ GE junction, Meckle's divertic resected, & cholecystectomy     SHOULDER SURGERY  04/2005   left by Dr. Yvone     MEDICATIONS:   Current Facility-Administered Medications:    acetaminophen  (TYLENOL ) tablet 650 mg, 650 mg, Oral, Q6H PRN **OR** acetaminophen  (TYLENOL ) suppository 650 mg, 650 mg, Rectal, Q6H PRN, Celinda Alm Lot, MD   amLODipine  (NORVASC ) tablet 5 mg, 5 mg, Oral, Daily, Celinda Alm Lot, MD   [START ON 11/17/2023] aspirin  EC tablet 81 mg, 81 mg, Oral, QPM, Celinda Alm Lot, MD   carvedilol  (COREG ) tablet 12.5 mg, 12.5 mg, Oral, BID WC, Celinda Alm Lot, MD   cyanocobalamin  (VITAMIN B12) tablet 1,000 mcg, 1,000 mcg, Oral, Daily, Celinda Alm Lot, MD   [START ON 11/17/2023] empagliflozin  (JARDIANCE ) tablet 10 mg, 10 mg, Oral, QAC breakfast, Celinda Alm Lot, MD   escitalopram  (LEXAPRO ) tablet 20 mg, 20 mg, Oral, Daily, Celinda Alm Lot, MD   hydrALAZINE  (APRESOLINE ) tablet 50 mg, 50 mg, Oral, TID, Celinda Alm Lot, MD   HYDROmorphone  (DILAUDID ) injection 0.5 mg, 0.5 mg, Intravenous, Q2H PRN, Celinda Alm Lot, MD   insulin  aspart (novoLOG ) injection 0-20 Units, 0-20 Units, Subcutaneous, TID WC, Celinda Alm Lot, MD   insulin  glargine (1 Unit Dial ) ([name] ) Solostar Pen SOPN 16 Units, 16 Units, Subcutaneous, QHS, Celinda Alm Lot, MD   meclizine  (ANTIVERT ) tablet 12.5 mg, 12.5 mg, Oral, TID PRN, Celinda Alm Lot, MD   ondansetron  (ZOFRAN ) tablet 4 mg, 4 mg, Oral, Q6H PRN **OR** ondansetron  (ZOFRAN ) injection 4 mg, 4 mg, Intravenous, Q6H PRN, Celinda Alm Lot, MD   oxyCODONE  (Oxy IR/ROXICODONE ) immediate release tablet 5 mg, 5 mg, Oral, Q4H  PRN, Celinda Alm Lot, MD   polyethylene glycol powder (GLYCOLAX /MIRALAX ) container 17 g, 17 g, Oral, Daily, Celinda Alm Lot, MD   potassium chloride  SA (KLOR-CON  M) CR tablet 40 mEq, 40 mEq, Oral, Once, Celinda Alm Lot, MD   tamsulosin  (FLOMAX ) capsule 0.4 mg, 0.4 mg, Oral, Daily, Celinda Alm Lot, MD  Current Outpatient Medications:    amLODipine  (NORVASC ) 10 MG tablet, Take 5 mg by mouth daily., Disp: , Rfl:    aspirin  EC 81 MG tablet, Take 81 mg by mouth every evening., Disp: , Rfl:    carvedilol  (COREG ) 12.5 MG tablet, TAKE 1 TABLET BY MOUTH TWICE  DAILY WITH MEALS, Disp: 200 tablet, Rfl: 2   COD LIVER OIL PO, Take 1 capsule by mouth 2 (two) times daily. , Disp: , Rfl:    cyanocobalamin  (VITAMIN B12) 1000 MCG tablet, Take 1 tablet (1,000 mcg total) by mouth daily., Disp: , Rfl:    diclofenac  Sodium (VOLTAREN ) 1 % GEL, Apply 2 g topically 4 (four) times daily., Disp: , Rfl:    empagliflozin  (JARDIANCE ) 10 MG TABS tablet, Take 1 tablet (10 mg total) by mouth daily before breakfast. Bill secondary: BIN [id number], PCN [id number], GRP [id number], ID [id number], Disp: 90 tablet, Rfl: 3   escitalopram  (LEXAPRO ) 20 MG tablet, Take 1 tablet (20 mg total) by mouth daily., Disp: 90 tablet, Rfl: 1   furosemide  (LASIX ) 40 MG tablet, Take 1 tablet (40 mg  total) by mouth daily. Can take an additional 20mg  as needed for weight gain of 2 lbs in a day or 5lbs in a week, Disp: 45 tablet, Rfl: 1   hydrALAZINE  (APRESOLINE ) 50 MG tablet, TAKE 1 TABLET BY MOUTH 3 TIMES  DAILY, Disp: 300 tablet, Rfl: 2   losartan  (COZAAR ) 25 MG tablet, Take 1 tablet (25 mg total) by mouth daily., Disp: 30 tablet, Rfl: 0   meclizine  (ANTIVERT ) 12.5 MG tablet, Take 1 tablet (12.5 mg total) by mouth 3 (three) times daily as needed for dizziness., Disp: 90 tablet, Rfl: 1   meloxicam  (MOBIC ) 7.5 MG tablet, Take 1 tablet (7.5 mg total) by mouth daily. With food.  Don't take aleve  or ibuprofen ., Disp: 90 tablet, Rfl: 1   ondansetron  (ZOFRAN ) 4 MG tablet, Take 1 tablet (4 mg total) by mouth every 6 (six) hours., Disp: 12 tablet, Rfl: 0   potassium chloride  SA (KLOR-CON  M) 20 MEQ tablet, Take 2 tablets (40 mEq total) by mouth daily., Disp: 60 tablet, Rfl: 1   [name]  SOLOSTAR 300 UNIT/ML Solostar Pen, INJECT SUBCUTANEOUSLY 16 UNITS  AT BEDTIME (Patient taking differently: Inject 16 Units into the skin at bedtime.), Disp: 9 mL, Rfl: 2   traMADol  (ULTRAM ) 50 MG tablet, TAKE 1 TABLET BY MOUTH EVERY 12 HOURS AS NEEDED FOR KNEE PAIN. (Patient taking differently: Take 50 mg by mouth every 12 (twelve) hours as needed for moderate pain (pain score 4-6) or severe pain (pain score 7-10). TAKE 1 TABLET BY MOUTH EVERY 12 HOURS AS NEEDED FOR KNEE PAIN.), Disp: 60 tablet, Rfl: 3   Accu-Chek Softclix Lancets lancets, TEST BLOOD SUGAR UP TO THREE TIMES DAILY, Disp: 300 each, Rfl: 3   Blood Glucose Calibration (ACCU-CHEK AVIVA) SOLN, Use as directed monthly. E11.59  Insulin  dependent, Disp: 1 each, Rfl: 3   Blood Glucose Monitoring Suppl (CONTOUR BLOOD GLUCOSE SYSTEM) w/Device KIT, 1 each by Does not apply route 3 (three) times daily., Disp: 1 kit, Rfl: 0   DROPLET PEN NEEDLES 30G X 8 MM MISC, USE  WITH  INSULIN   PEN, Disp: 400 each, Rfl: 5   glucose blood (CONTOUR TEST) test strip, Use as instructed,  Disp: 100 each, Rfl: 12   glucose blood (ONETOUCH ULTRA) test strip, USE TO CHECK UP TO 3 TIMES DAILY AS NEEDED, Disp: 300 strip, Rfl: 2   insulin  lispro (HUMALOG  KWIKPEN) 100 UNIT/ML KwikPen, INJECT SUBCUTANEOUSLY 4 TO 10  UNITS 3 TIMES DAILY BEFORE MEALS PER SLIDING SCALE: 151-200 = 4  UN, 201-250 = 6 UN, 251-300 = 8  UN, AND OVER 300 = 10 UN, Disp: 30 mL, Rfl: 2   polyethylene glycol powder (GLYCOLAX /MIRALAX ) 17 GM/SCOOP powder, Take 17 g by mouth daily as needed. (Patient not taking: Reported on 11/16/2023), Disp: , Rfl:    tamsulosin  (FLOMAX ) 0.4 MG CAPS capsule, TAKE 1 CAPSULE BY MOUTH DAILY  AFTER SUPPER (Patient taking differently: Take 0.4 mg by mouth daily. TAKE 1 CAPSULE BY MOUTH  DAILY AFTER SUPPER), Disp: 100 capsule, Rfl: 2  ALLERGIES:   Allergies  Allergen Reactions   Morphine  Nausea Only, Other (See Comments) and Nausea And Vomiting    Can take with food and usually doesn't cause nausea   Atorvastatin      Intolerant - nausea/myalgias   Codeine Phosphate Nausea And Vomiting   Metformin  And Related Diarrhea    REVIEW OF SYSTEMS: REVIEWED IN DETAIL IN CHART  FAMILY HISTORY:   Family History  Problem Relation Age of Onset   Heart attack Mother    Dementia Brother    Colon cancer Neg Hx    Prostate cancer Neg Hx     SOCIAL HISTORY:   Social History   Tobacco Use   Smoking status: Never    Passive exposure: Never   Smokeless tobacco: Never  Substance Use Topics   Alcohol use: No    Alcohol/week: 0.0 standard drinks of alcohol     EXAMINATION: Vital signs in last 24 hours: Temp:  [98.6 F (37 C)-98.7 F (37.1 C)] 98.6 F (37 C) (10/06 1104) Pulse Rate:  [92-101] 101 (10/06 1104) Resp:  [16-18] 18 (10/06 1104) BP: (128-192)/(74-94) 148/74 (10/06 1104) SpO2:  [96 %-98 %] 98 % (10/06 1104)  BP (!) 148/74 (BP Location: Right Arm)   Pulse (!) 101   Temp 98.6 F (37 C) (Oral)   Resp 18   SpO2 98%   General Appearance:    Alert, cooperative, no distress,  appears stated age  Head:    Normocephalic, without obvious abnormality, atraumatic  Eyes:    PERRL, conjunctiva/corneas clear, EOM's intact, fundi    benign, both eyes       Ears:    Normal TM's and external ear canals, both ears  Nose:   Nares normal, septum midline, mucosa normal, no drainage    or sinus tenderness  Throat:   Lips, mucosa, and tongue normal; teeth and gums normal  Neck:   Supple, symmetrical, trachea midline, no adenopathy;       thyroid :  No enlargement/tenderness/nodules; no carotid   bruit or JVD  Back:     Large bruise and pain to palp  Lungs:     Clear to auscultation bilaterally, respirations unlabored  Chest wall:    Tenderness but no deformity  Heart:    Regular rate and rhythm, S1 and S2 normal, no murmur, rub   or gallop  Abdomen:     Soft, non-tender, bowel sounds active all four quadrants,    no masses, no organomegaly  Genitalia:    Rectal:    Extremities:  Extremities normal, atraumatic, no cyanosis or edema except left hip painful ROM.  Knees also with flexion contractures  Pulses:   2+ and symmetric all extremities  Skin:   Skin color, texture, turgor normal, no rashes or lesions  Lymph nodes:   Cervical, supraclavicular, and axillary nodes normal  Neurologic:   CNII-XII intact. Normal strength, sensation and reflexes      throughout    Musculoskeletal Exam:   painful left hip and stiff knees   DIAGNOSTIC STUDIES: Recent laboratory studies: Recent Labs    11/16/23 0820  WBC 14.2*  HGB 14.6  HCT 44.3  PLT 307   Recent Labs    11/16/23 0820  NA 134*  K 3.4*  CL 101  CO2 17*  BUN 42*  CREATININE 1.53*  GLUCOSE 380*  CALCIUM  9.8   Lab Results  Component Value Date   INR 1.1 04/08/2023   INR 1.0 03/24/2023   INR 0.98 03/16/2018     Recent Radiographic Studies :  CT CHEST ABDOMEN PELVIS W CONTRAST Result Date: 11/16/2023 EXAM: CT CHEST, ABDOMEN AND PELVIS WITH CONTRAST 11/16/2023 10:39:42 AM TECHNIQUE: CT of the chest, abdomen  and pelvis was performed with the administration of 75 mL of iohexol  (OMNIPAQUE ) 300 MG/ML solution. Both precontrast and postcontrast images of the chest abdomen and pelvis are provided. Multiplanar reformatted images are provided for review. Automated exposure control, iterative reconstruction, and/or weight based adjustment of the mA/kV was utilized to reduce the radiation dose to as low as reasonably achievable. COMPARISON: Chest CT 03/20/2016, chest radiographs 07:51 AM today, CT abdomen and pelvis 01/30/2014. CLINICAL HISTORY: 89 year old male with polytrauma from fall, left ribcage pain, and right forehead injury. FINDINGS: CHEST: MEDIASTINUM AND LYMPH NODES: Advanced chronic calcified coronary artery atherosclerosis calcified coronary artery plaque versus stents. A small anterior pericardial effusion with fairly simple fluid density on series 2 image 48 is larger than in 2018 and 2015 but significance is doubtful. The central airways are clear. No mediastinal, hilar or axillary lymphadenopathy. Left axillary surgical clips are new since 2018. LUNGS AND PLEURA: Mildly lower lung volumes than in 2018. Similar mild atelectatic changes to the major airways which remain patent. Chronic elevation of the right hemidiaphragm is stable. New bilateral pulmonary mosaics attenuation, but favor a combination of gas trapping and atelectasis in this setting. No pneumothorax. No pleural effusion. No convincing pulmonary contusion. Increased lung base atelectasis, including platelike opacity over the right hemidiaphragm. ABDOMEN AND PELVIS: LIVER: The liver is unremarkable. GALLBLADDER AND BILE DUCTS: Chronic cholecystectomy. No acute biliary ductal dilatation. SPLEEN: No acute abnormality. PANCREAS: No acute abnormality. ADRENAL GLANDS: No acute abnormality. KIDNEYS, URETERS AND BLADDER: Chronic hyperdense, but benign appearing exophytic left renal upper pole cyst (series 4 image 58) which does not enhance. Per consensus,  no follow-up is needed for simple Bosniak type 1 and 2 renal cysts, unless the patient has a malignancy history or risk factors. Left nephrolithiasis is chronic. No left hydronephrosis. Large solid, exophytic, and heterogeneously enhancing mass of the right renal upper pole encompassing 63 x 92 x 73 mm (AP x transverse x CC). This is new from previous exams and consistent with renal cell carcinoma see sagittal image 65 today. No superimposed right renal obstruction, and underlying chronic renal cortical scarring and volume loss. No hydroureter. Urinary bladder is unremarkable. On delayed phase images there is symmetric renal contrast excretion. GI AND BOWEL: Stomach demonstrates no acute abnormality. Abundant retained stool in the rectosigmoid colon including a 9 cm diameter rectal stool  ball. Diverticulosis of the descending colon with mild to moderate retained stool. Similar retained stool in the transverse colon. Right colon less affected. No large bowel inflammation identified. Diminutive or absent appendix. Chronic surgical clips in the transverse colon mesentery and also in the distal small bowel with no adverse features stable since 2015. Chronic postoperative changes also at the gastroesophageal junction, stable. There is no bowel obstruction. REPRODUCTIVE ORGANS: Heterogeneous prostate enlargement. PERITONEUM AND RETROPERITONEUM: No ascites. No free air. VASCULATURE: Aorta is normal in caliber. ABDOMINAL AND PELVIS LYMPH NODES: No lymphadenopathy. BONES AND SOFT TISSUES: Minimally displaced left posterolateral 10th rib fracture on series 8 image 116. Nondisplaced left posterolateral 9th rib fracture best seen on coronal image 47. Diffuse idiopathic skeletal hyperostosis. Associated widespread chronic thoracic spinal interbody ankylosis similar bulky hyperostosis and ankylosis in the partially visible lower cervical spine vertebral height appears maintained and stable. Mildly transitional lumbosacral  anatomy, vestigial S1-S2 disc space chronic lumbar spine degeneration including multilevel chronic vacuum disc disease lumbar vertebral height appears stable since 2015. Comminuted proximal left femur intertrochanteric fracture acute, with varus impaction (coronal image 78 and series 2 image 118). The left acetabulum, pubic rami, and pelvis appear to remain intact. No measurable soft tissue hematoma about the left hip. No other acute osseous abnormality is identified in the chest, abdomen, or pelvis. IMPRESSION: 1. Renal Cell Carcinoma: Large 9.2 cm solid, exophytic right upper pole renal mass is new since 2018. No metastatic disease identified. Recommend Urology consultation. 2. Acute comminuted proximal left femur intertrochanteric fracture with varus impaction. 3. Minimally displaced left posterolateral 10th rib fracture and nondisplaced left posterolateral 9th rib fracture. No pneumothorax, pulmonary contusion, or pleural effusion 4. Abundant retained stool in the rectosigmoid colon, including a 9 cm rectal stool ball. No large bowel obstruction or inflammation identified. Electronically signed by: Helayne Hurst MD 11/16/2023 11:04 AM EDT RP Workstation: [id number]   CT Cervical Spine Wo Contrast Result Date: 11/16/2023 EXAM: CT CERVICAL SPINE WITHOUT CONTRAST 11/16/2023 08:36:39 AM TECHNIQUE: CT of the cervical spine was performed without the administration of intravenous contrast. Multiplanar reformatted images are provided for review. Automated exposure control, iterative reconstruction, and/or weight based adjustment of the mA/kV was utilized to reduce the radiation dose to as low as reasonably achievable. COMPARISON: CT of the cervical spine dated 06/08/2023. CLINICAL HISTORY: Neck trauma, intoxicated or obtunded (Age >= 16y). Pt reports of fall from standing. Pt reports hitting the night stand on his left side ribcage and has pain down side and down leg. Pt reports hitting right side forehead as well. Pt  denies LOC and denies taking any blood thinners. FINDINGS: CERVICAL SPINE: BONES AND ALIGNMENT: No acute fracture or traumatic malalignment. Slight degenerative anterolisthesis at C3-C4. DEGENERATIVE CHANGES: Moderate hypertrophic changes again noted within the atlantoaxial joint resulting in moderate central spinal canal stenosis at the craniocervical junction, as before. There is a posterior bridging osteophyte again demonstrated at C2-C3 also resulting in moderate central spinal canal stenosis. There are anterior bridging osteophytes extending from C4 through T2. SOFT TISSUES: No prevertebral soft tissue swelling. There is calcification within the carotid bulbs. IMPRESSION: 1. No acute abnormality of the cervical spine related to the reported trauma. 2. Moderate hypertrophic changes within the atlantoaxial joint resulting in moderate central spinal canal stenosis at the craniocervical junction, unchanged. 3. Posterior bridging osteophyte at C2-3 resulting in moderate central spinal canal stenosis, unchanged. 4. Anterior bridging osteophytes extending from C4 through T2. Electronically signed by: Evalene Coho MD 11/16/2023 09:21 AM EDT RP Workstation:  [id number]   CT Head Wo Contrast Result Date: 11/16/2023 EXAM: CT HEAD WITHOUT CONTRAST 11/16/2023 08:36:39 AM TECHNIQUE: CT of the head was performed without the administration of intravenous contrast. Automated exposure control, iterative reconstruction, and/or weight based adjustment of the mA/kV was utilized to reduce the radiation dose to as low as reasonably achievable. COMPARISON: CT of the head dated 01/22/2023. CLINICAL HISTORY: Head trauma, intracranial arterial injury suspected. Pt reports of fall from standing. Pt reports hitting the night stand on his left side ribcage and has pain down side and down leg. Pt reports hitting right side forehead as well. Pt denies LOC and denies taking any blood thinners. FINDINGS: BRAIN AND VENTRICLES: No acute  hemorrhage. No evidence of acute infarct. No hydrocephalus. No extra-axial collection. No mass effect or midline shift. There is age-related volume loss and mild periventricular white matter disease. There is moderate calcific atheromatous disease within the carotid siphons and vertebral arteries. ORBITS: Patient is status post right lens replacement. SINUSES: No acute abnormality. SOFT TISSUES AND SKULL: No acute soft tissue abnormality. No skull fracture. IMPRESSION: 1. No acute intracranial abnormality. 2. Age-related cerebral volume loss and mild periventricular white matter changes. Electronically signed by: Evalene Coho MD 11/16/2023 08:56 AM EDT RP Workstation: [id number]   DG Chest 2 View Result Date: 11/16/2023 CLINICAL DATA:  Fall, left hip pain.  Left femur fracture. EXAM: CHEST - 2 VIEW COMPARISON:  09/29/2023. FINDINGS: Trachea is midline. Heart size stable. Thoracic aorta is calcified. Linear volume loss in the right lung base adjacent to an elevated right hemidiaphragm. Linear atelectasis or scarring in the lingula. No airspace consolidation or pleural fluid. Degenerative changes in the shoulders. IMPRESSION: No acute findings. Electronically Signed   By: Newell Eke M.D.   On: 11/16/2023 08:31   DG Hip Unilat W or Wo Pelvis 2-3 Views Left Result Date: 11/16/2023 CLINICAL DATA:  Fall, left hip pain. EXAM: DG HIP (WITH OR WITHOUT PELVIS) 2-3V LEFT COMPARISON:  None Available. FINDINGS: Mildly impacted intertrochanteric left femur fracture with varus angulation. No dislocation. IMPRESSION: Left intertrochanteric femur fracture. Electronically Signed   By: Newell Eke M.D.   On: 11/16/2023 08:30    ASSESSMENT:  left hip IT fracture   PLAN:  plan troch nail tomorrow if cleared by medicine. Spinal vs general anesthesia. Will be PWB afterwards for 1-2 months and will need PT  Maude KANDICE Herald 11/16/2023, 1:12 PM

## 2023-11-16 NOTE — ED Notes (Signed)
 Pt SpO2 noted to be around 86% with good waveform, pt placed on 2 L/M [place].

## 2023-11-16 NOTE — Anesthesia Preprocedure Evaluation (Signed)
 Anesthesia Evaluation  Patient identified by MRN, date of birth, ID band Patient awake    Reviewed: Allergy & Precautions, NPO status , Patient's Chart, lab work & pertinent test results  History of Anesthesia Complications Negative for: history of anesthetic complications  Airway Mallampati: II  TM Distance: >3 FB Neck ROM: Full    Dental  (+) Dental Advisory Given   Pulmonary  Multiple broken ribs   breath sounds clear to auscultation       Cardiovascular hypertension, Pt. on medications + CAD and +CHF   Rhythm:Regular Rate:Normal  03/2023 ECHO: EF 35 to 40%.  1. The LV has moderately decreased function. The left ventricle demonstrates global hypokinesis. The left ventricular internal cavity size was mildly dilated. Grade II diastolic dysfunction (pseudonormalization).   2. RVF is normal. The right ventricular size is moderately enlarged. Tricuspid regurgitation signal is inadequate for assessing PA pressure.   3. Left atrial size was mildly dilated.   4. The mitral valve is normal in structure. Trivial mitral valve regurgitation. No evidence of mitral stenosis.   5. The aortic valve was not well visualized. Aortic valve regurgitation is not visualized. No aortic stenosis is present.     Neuro/Psych   Anxiety        GI/Hepatic negative GI ROS, Neg liver ROS,,,  Endo/Other  diabetes, Oral Hypoglycemic Agents, Insulin  Dependent    Renal/GU Renal InsufficiencyRenal diseaseH/o stones     Musculoskeletal   Abdominal   Peds  Hematology Hb 14.6, plt 307   Anesthesia Other Findings   Reproductive/Obstetrics                              Anesthesia Physical Anesthesia Plan  ASA: 4  Anesthesia Plan: Regional   Post-op Pain Management:    Induction:   PONV Risk Score and Plan:   Airway Management Planned:   Additional Equipment:   Intra-op Plan:   Post-operative Plan:   Informed  Consent: I have reviewed the patients History and Physical, chart, labs and discussed the procedure including the risks, benefits and alternatives for the proposed anesthesia with the patient or authorized representative who has indicated his/her understanding and acceptance.     Dental advisory given and Consent reviewed with POA  Plan Discussed with: CRNA and Surgeon  Anesthesia Plan Comments: (Consent from pt and his son: plan femoral nerve block for bridging analgesia until OR tomorrow)        Anesthesia Quick Evaluation

## 2023-11-16 NOTE — ED Provider Notes (Signed)
 [place] EMERGENCY DEPARTMENT AT [facility] Provider Note   CSN: [id number] Arrival date & time: 11/16/23  [id number]     Patient presents with: [name] is a 89 y.o. male.   Patient has a history of coronary artery disease hypertension and diabetes.  He had a fall today and complains of left hip pain  The history is provided by the patient and medical records. No language interpreter was used.  Fall This is a new problem. The current episode started 6 to 12 hours ago. The problem occurs rarely. The problem has been resolved. Associated symptoms include chest pain. Pertinent negatives include no abdominal pain and no headaches. Nothing aggravates the symptoms. Nothing relieves the symptoms. He has tried nothing for the symptoms.       Prior to Admission medications   Medication Sig Start Date End Date Taking? Authorizing Provider  Accu-Chek Softclix Lancets lancets TEST BLOOD SUGAR UP TO THREE TIMES DAILY 01/23/20   [name], MD  amLODipine  (NORVASC ) 10 MG tablet Take 5 mg by mouth daily. 06/14/23   [provider]  aspirin  EC 81 MG tablet Take 81 mg by mouth every evening.    [provider]  Blood Glucose Calibration (ACCU-CHEK AVIVA) SOLN Use as directed monthly. E11.59  Insulin  dependent 12/06/18   [name], MD  Blood Glucose Monitoring Suppl (CONTOUR BLOOD GLUCOSE SYSTEM) w/Device KIT 1 each by Does not apply route 3 (three) times daily. 11/09/23   [name], MD  carvedilol  (COREG ) 12.5 MG tablet TAKE 1 TABLET BY MOUTH TWICE  DAILY WITH MEALS 11/13/23   [name], MD  COD LIVER OIL PO Take 1 capsule by mouth 2 (two) times daily.     [provider]  cyanocobalamin  (VITAMIN B12) 1000 MCG tablet Take 1 tablet (1,000 mcg total) by mouth daily. 09/29/23   [name], MD  diclofenac  Sodium (VOLTAREN ) 1 % GEL Apply 2 g topically 4 (four) times daily. 06/11/23   Vann, Jessica U, DO  DROPLET PEN NEEDLES 30G X  8 MM MISC USE  WITH  INSULIN   PEN 02/03/19   [name], MD  empagliflozin  (JARDIANCE ) 10 MG TABS tablet Take 1 tablet (10 mg total) by mouth daily before breakfast. Bill secondary: BIN [id number], PCN [id number], GRP [id number], ID [id number] 10/09/23   [name], MD  escitalopram  (LEXAPRO ) 20 MG tablet Take 1 tablet (20 mg total) by mouth daily. 09/29/23   [name], MD  furosemide  (LASIX ) 40 MG tablet Take 1 tablet (40 mg total) by mouth daily. Can take an additional 20mg  as needed for weight gain of 2 lbs in a day or 5lbs in a week 06/23/23   Wyn Jackee VEAR Mickey., NP  glucose blood (CONTOUR TEST) test strip Use as instructed 11/09/23   [name], MD  glucose blood (ONETOUCH ULTRA) test strip USE TO CHECK UP TO 3 TIMES DAILY AS NEEDED 09/29/23   [name], MD  hydrALAZINE  (APRESOLINE ) 50 MG tablet TAKE 1 TABLET BY MOUTH 3 TIMES  DAILY 11/13/23   [name], MD  insulin  lispro (HUMALOG  KWIKPEN) 100 UNIT/ML KwikPen INJECT SUBCUTANEOUSLY 4 TO 10  UNITS 3 TIMES DAILY BEFORE MEALS PER SLIDING SCALE: 151-200 = 4  UN, 201-250 = 6 UN, 251-300 = 8  UN, AND OVER 300 = 10 UN 07/08/23   [name], MD  losartan  (COZAAR ) 25 MG tablet Take 1 tablet (25  mg total) by mouth daily. 04/17/23 04/16/24  Vernon Ranks, MD  meclizine  (ANTIVERT ) 12.5 MG tablet Take 1 tablet (12.5 mg total) by mouth 3 (three) times daily as needed for dizziness. 09/29/23   [name], MD  meloxicam  (MOBIC ) 7.5 MG tablet Take 1 tablet (7.5 mg total) by mouth daily. With food.  Don't take aleve  or ibuprofen . 09/29/23   [name], MD  ondansetron  (ZOFRAN ) 4 MG tablet Take 1 tablet (4 mg total) by mouth every 6 (six) hours. 03/06/23   Kingsley, Victoria K, DO  polyethylene glycol powder (GLYCOLAX /MIRALAX ) 17 GM/SCOOP powder Take 17 g by mouth daily as needed. 09/30/23   [name], MD  potassium chloride  SA (KLOR-CON  M) 20 MEQ tablet Take 2 tablets (40 mEq total) by mouth daily. 06/12/23   Vann, Jessica U, DO   tamsulosin  (FLOMAX ) 0.4 MG CAPS capsule TAKE 1 CAPSULE BY MOUTH DAILY  AFTER SUPPER 08/19/23   [name], MD  [name]  SOLOSTAR 300 UNIT/ML Solostar Pen INJECT SUBCUTANEOUSLY 16 UNITS  AT BEDTIME 11/13/23   [name], MD  traMADol  (ULTRAM ) 50 MG tablet TAKE 1 TABLET BY MOUTH EVERY 12 HOURS AS NEEDED FOR KNEE PAIN. 09/29/23   [name], MD    Allergies: Morphine , Atorvastatin , Codeine phosphate, and Metformin  and related    Review of Systems  Constitutional:  Negative for appetite change and fatigue.  HENT:  Negative for congestion, ear discharge and sinus pressure.   Eyes:  Negative for discharge.  Respiratory:  Negative for cough.   Cardiovascular:  Positive for chest pain.  Gastrointestinal:  Negative for abdominal pain and diarrhea.  Genitourinary:  Negative for frequency and hematuria.  Musculoskeletal:  Negative for back pain.       Left hip pain  Skin:  Negative for rash.  Neurological:  Negative for seizures and headaches.  Psychiatric/Behavioral:  Negative for hallucinations.     Updated Vital Signs BP (!) 148/74 (BP Location: Right Arm)   Pulse (!) 101   Temp 98.6 F (37 C) (Oral)   Resp 18   SpO2 98%   Physical Exam Vitals and nursing note reviewed.  Constitutional:      Appearance: He is well-developed.  HENT:     Head: Normocephalic.     Nose: Nose normal.  Eyes:     General: No scleral icterus.    Conjunctiva/sclera: Conjunctivae normal.  Neck:     Thyroid : No thyromegaly.  Cardiovascular:     Rate and Rhythm: Normal rate and regular rhythm.     Heart sounds: No murmur heard.    No friction rub. No gallop.  Pulmonary:     Breath sounds: No stridor. No wheezing or rales.  Chest:     Chest wall: Tenderness present.  Abdominal:     General: There is no distension.     Tenderness: There is no abdominal tenderness. There is no rebound.  Musculoskeletal:     Cervical back: Neck supple.     Comments: Tender left hip  Lymphadenopathy:      Cervical: No cervical adenopathy.  Skin:    Findings: No erythema or rash.  Neurological:     Mental Status: He is alert and oriented to person, place, and time.     Motor: No abnormal muscle tone.     Coordination: Coordination normal.  Psychiatric:        Behavior: Behavior normal.     (all labs ordered are listed, but only abnormal results are  displayed) Labs Reviewed  CBC WITH DIFFERENTIAL/PLATELET - Abnormal; Notable for the following components:      Result Value   WBC 14.2 (*)    Neutro Abs 10.8 (*)    Abs Immature Granulocytes 0.10 (*)    All other components within normal limits  BASIC METABOLIC PANEL WITH GFR - Abnormal; Notable for the following components:   Sodium 134 (*)    Potassium 3.4 (*)    CO2 17 (*)    Glucose, Bld 380 (*)    BUN 42 (*)    Creatinine, Ser 1.53 (*)    GFR, Estimated 44 (*)    All other components within normal limits  TYPE AND SCREEN    EKG: None  Radiology: CT CHEST ABDOMEN PELVIS W CONTRAST Result Date: 11/16/2023 EXAM: CT CHEST, ABDOMEN AND PELVIS WITH CONTRAST 11/16/2023 10:39:42 AM TECHNIQUE: CT of the chest, abdomen and pelvis was performed with the administration of 75 mL of iohexol  (OMNIPAQUE ) 300 MG/ML solution. Both precontrast and postcontrast images of the chest abdomen and pelvis are provided. Multiplanar reformatted images are provided for review. Automated exposure control, iterative reconstruction, and/or weight based adjustment of the mA/kV was utilized to reduce the radiation dose to as low as reasonably achievable. COMPARISON: Chest CT 03/20/2016, chest radiographs 07:51 AM today, CT abdomen and pelvis 01/30/2014. CLINICAL HISTORY: 89 year old male with polytrauma from fall, left ribcage pain, and right forehead injury. FINDINGS: CHEST: MEDIASTINUM AND LYMPH NODES: Advanced chronic calcified coronary artery atherosclerosis calcified coronary artery plaque versus stents. A small anterior pericardial effusion with fairly simple  fluid density on series 2 image 48 is larger than in 2018 and 2015 but significance is doubtful. The central airways are clear. No mediastinal, hilar or axillary lymphadenopathy. Left axillary surgical clips are new since 2018. LUNGS AND PLEURA: Mildly lower lung volumes than in 2018. Similar mild atelectatic changes to the major airways which remain patent. Chronic elevation of the right hemidiaphragm is stable. New bilateral pulmonary mosaics attenuation, but favor a combination of gas trapping and atelectasis in this setting. No pneumothorax. No pleural effusion. No convincing pulmonary contusion. Increased lung base atelectasis, including platelike opacity over the right hemidiaphragm. ABDOMEN AND PELVIS: LIVER: The liver is unremarkable. GALLBLADDER AND BILE DUCTS: Chronic cholecystectomy. No acute biliary ductal dilatation. SPLEEN: No acute abnormality. PANCREAS: No acute abnormality. ADRENAL GLANDS: No acute abnormality. KIDNEYS, URETERS AND BLADDER: Chronic hyperdense, but benign appearing exophytic left renal upper pole cyst (series 4 image 58) which does not enhance. Per consensus, no follow-up is needed for simple Bosniak type 1 and 2 renal cysts, unless the patient has a malignancy history or risk factors. Left nephrolithiasis is chronic. No left hydronephrosis. Large solid, exophytic, and heterogeneously enhancing mass of the right renal upper pole encompassing 63 x 92 x 73 mm (AP x transverse x CC). This is new from previous exams and consistent with renal cell carcinoma see sagittal image 65 today. No superimposed right renal obstruction, and underlying chronic renal cortical scarring and volume loss. No hydroureter. Urinary bladder is unremarkable. On delayed phase images there is symmetric renal contrast excretion. GI AND BOWEL: Stomach demonstrates no acute abnormality. Abundant retained stool in the rectosigmoid colon including a 9 cm diameter rectal stool ball. Diverticulosis of the descending  colon with mild to moderate retained stool. Similar retained stool in the transverse colon. Right colon less affected. No large bowel inflammation identified. Diminutive or absent appendix. Chronic surgical clips in the transverse colon mesentery and also in the distal  small bowel with no adverse features stable since 2015. Chronic postoperative changes also at the gastroesophageal junction, stable. There is no bowel obstruction. REPRODUCTIVE ORGANS: Heterogeneous prostate enlargement. PERITONEUM AND RETROPERITONEUM: No ascites. No free air. VASCULATURE: Aorta is normal in caliber. ABDOMINAL AND PELVIS LYMPH NODES: No lymphadenopathy. BONES AND SOFT TISSUES: Minimally displaced left posterolateral 10th rib fracture on series 8 image 116. Nondisplaced left posterolateral 9th rib fracture best seen on coronal image 47. Diffuse idiopathic skeletal hyperostosis. Associated widespread chronic thoracic spinal interbody ankylosis similar bulky hyperostosis and ankylosis in the partially visible lower cervical spine vertebral height appears maintained and stable. Mildly transitional lumbosacral anatomy, vestigial S1-S2 disc space chronic lumbar spine degeneration including multilevel chronic vacuum disc disease lumbar vertebral height appears stable since 2015. Comminuted proximal left femur intertrochanteric fracture acute, with varus impaction (coronal image 78 and series 2 image 118). The left acetabulum, pubic rami, and pelvis appear to remain intact. No measurable soft tissue hematoma about the left hip. No other acute osseous abnormality is identified in the chest, abdomen, or pelvis. IMPRESSION: 1. Renal Cell Carcinoma: Large 9.2 cm solid, exophytic right upper pole renal mass is new since 2018. No metastatic disease identified. Recommend Urology consultation. 2. Acute comminuted proximal left femur intertrochanteric fracture with varus impaction. 3. Minimally displaced left posterolateral 10th rib fracture and  nondisplaced left posterolateral 9th rib fracture. No pneumothorax, pulmonary contusion, or pleural effusion 4. Abundant retained stool in the rectosigmoid colon, including a 9 cm rectal stool ball. No large bowel obstruction or inflammation identified. Electronically signed by: Helayne Hurst MD 11/16/2023 11:04 AM EDT RP Workstation: [id number]   CT Cervical Spine Wo Contrast Result Date: 11/16/2023 EXAM: CT CERVICAL SPINE WITHOUT CONTRAST 11/16/2023 08:36:39 AM TECHNIQUE: CT of the cervical spine was performed without the administration of intravenous contrast. Multiplanar reformatted images are provided for review. Automated exposure control, iterative reconstruction, and/or weight based adjustment of the mA/kV was utilized to reduce the radiation dose to as low as reasonably achievable. COMPARISON: CT of the cervical spine dated 06/08/2023. CLINICAL HISTORY: Neck trauma, intoxicated or obtunded (Age >= 16y). Pt reports of fall from standing. Pt reports hitting the night stand on his left side ribcage and has pain down side and down leg. Pt reports hitting right side forehead as well. Pt denies LOC and denies taking any blood thinners. FINDINGS: CERVICAL SPINE: BONES AND ALIGNMENT: No acute fracture or traumatic malalignment. Slight degenerative anterolisthesis at C3-C4. DEGENERATIVE CHANGES: Moderate hypertrophic changes again noted within the atlantoaxial joint resulting in moderate central spinal canal stenosis at the craniocervical junction, as before. There is a posterior bridging osteophyte again demonstrated at C2-C3 also resulting in moderate central spinal canal stenosis. There are anterior bridging osteophytes extending from C4 through T2. SOFT TISSUES: No prevertebral soft tissue swelling. There is calcification within the carotid bulbs. IMPRESSION: 1. No acute abnormality of the cervical spine related to the reported trauma. 2. Moderate hypertrophic changes within the atlantoaxial joint resulting in  moderate central spinal canal stenosis at the craniocervical junction, unchanged. 3. Posterior bridging osteophyte at C2-3 resulting in moderate central spinal canal stenosis, unchanged. 4. Anterior bridging osteophytes extending from C4 through T2. Electronically signed by: Evalene Coho MD 11/16/2023 09:21 AM EDT RP Workstation: [id number]   CT Head Wo Contrast Result Date: 11/16/2023 EXAM: CT HEAD WITHOUT CONTRAST 11/16/2023 08:36:39 AM TECHNIQUE: CT of the head was performed without the administration of intravenous contrast. Automated exposure control, iterative reconstruction, and/or weight based adjustment of the mA/kV was utilized  to reduce the radiation dose to as low as reasonably achievable. COMPARISON: CT of the head dated 01/22/2023. CLINICAL HISTORY: Head trauma, intracranial arterial injury suspected. Pt reports of fall from standing. Pt reports hitting the night stand on his left side ribcage and has pain down side and down leg. Pt reports hitting right side forehead as well. Pt denies LOC and denies taking any blood thinners. FINDINGS: BRAIN AND VENTRICLES: No acute hemorrhage. No evidence of acute infarct. No hydrocephalus. No extra-axial collection. No mass effect or midline shift. There is age-related volume loss and mild periventricular white matter disease. There is moderate calcific atheromatous disease within the carotid siphons and vertebral arteries. ORBITS: Patient is status post right lens replacement. SINUSES: No acute abnormality. SOFT TISSUES AND SKULL: No acute soft tissue abnormality. No skull fracture. IMPRESSION: 1. No acute intracranial abnormality. 2. Age-related cerebral volume loss and mild periventricular white matter changes. Electronically signed by: Evalene Coho MD 11/16/2023 08:56 AM EDT RP Workstation: [id number]   DG Chest 2 View Result Date: 11/16/2023 CLINICAL DATA:  Fall, left hip pain.  Left femur fracture. EXAM: CHEST - 2 VIEW COMPARISON:  09/29/2023.  FINDINGS: Trachea is midline. Heart size stable. Thoracic aorta is calcified. Linear volume loss in the right lung base adjacent to an elevated right hemidiaphragm. Linear atelectasis or scarring in the lingula. No airspace consolidation or pleural fluid. Degenerative changes in the shoulders. IMPRESSION: No acute findings. Electronically Signed   By: Newell Eke M.D.   On: 11/16/2023 08:31   DG Hip Unilat W or Wo Pelvis 2-3 Views Left Result Date: 11/16/2023 CLINICAL DATA:  Fall, left hip pain. EXAM: DG HIP (WITH OR WITHOUT PELVIS) 2-3V LEFT COMPARISON:  None Available. FINDINGS: Mildly impacted intertrochanteric left femur fracture with varus angulation. No dislocation. IMPRESSION: Left intertrochanteric femur fracture. Electronically Signed   By: Newell Eke M.D.   On: 11/16/2023 08:30     Procedures   Medications Ordered in the ED  HYDROmorphone  (DILAUDID ) injection 0.5 mg (0.5 mg Intravenous Given 11/16/23 0809)  ondansetron  (ZOFRAN ) injection 4 mg (4 mg Intravenous Given 11/16/23 0823)  ondansetron  (ZOFRAN ) injection 4 mg (4 mg Intravenous Given 11/16/23 0918)  sodium chloride  0.9 % bolus 1,000 mL (1,000 mLs Intravenous New Bag/Given 11/16/23 1039)  iohexol  (OMNIPAQUE ) 300 MG/ML solution 75 mL (75 mLs Intravenous Contrast Given 11/16/23 1016)  HYDROmorphone  (DILAUDID ) injection 0.5 mg (0.5 mg Intravenous Given 11/16/23 1105)   CRITICAL CARE Performed by: Fairy Sermon Total critical care time: 45 minutes Critical care time was exclusive of separately billable procedures and treating other patients. Critical care was necessary to treat or prevent imminent or life-threatening deterioration. Critical care was time spent personally by me on the following activities: development of treatment plan with patient and/or surrogate as well as nursing, discussions with consultants, evaluation of patient's response to treatment, examination of patient, obtaining history from patient or surrogate,  ordering and performing treatments and interventions, ordering and review of laboratory studies, ordering and review of radiographic studies, pulse oximetry and re-evaluation of patient's condition.                                  Medical Decision Making Amount and/or Complexity of Data Reviewed Labs: ordered. Radiology: ordered. ECG/medicine tests: ordered.  Risk Prescription drug management. Decision regarding hospitalization.   Patient with left hip fracture and fractured ribs.  He will be admitted to medicine and Dr. Dalldorf ortho  will see pt for fx hip     Final diagnoses:  Fall, initial encounter    ED Discharge Orders     None          Suzette Pac, MD 11/16/23 1606

## 2023-11-16 NOTE — Progress Notes (Signed)
 Short Stay Nursing Note TIME OUT for Regional Block Time Out procedure was done with attending anesthesiologist and Short Stay RN Name, DOB were confirmed MDA spoke with son to obtain consent as well Allergy Status and Current Medications also reviewed X-rays, MD notes reviewed and having patient pointing to area of injury prior to Regional Block being done. TIME OUT performed.

## 2023-11-16 NOTE — H&P (Signed)
 History and Physical    [name] FMW:[id number] DOB: [birth year] DOA: 11/16/2023 DOS: the patient was seen and examined on 11/16/2023 PCP: [name], MD  Patient coming from: Home  Chief Complaint:  Chief Complaint  Patient presents with   Fall   HPI: [name] is a 89 y.o. male with medical history significant of anxiety, hyperlipidemia, CAD, colon polyps, colon diverticulosis, type 2 diabetes, gastritis, nephrolithiasis, history of pyelonephritis, history of syncopal episode, hypertension, BPH, melanoma, myoclonus, biliary pancreatitis who had a  fall while standing at home hitting his left hip, left flank and left mid back.  He also stated he hit his forehead as well.  No LOC. He was unable to stand up and bear weight on his LLE.  Denied fever, chills, rhinorrhea, sore throat, wheezing or hemoptysis.  Positive chest wall pain,, but no precordial pain, palpitations, diaphoresis, PND, orthopnea or pitting edema of the lower extremities.  No abdominal pain, nausea, emesis, diarrhea, melena or hematochezia, but gets occasionally constipated.  No flank pain, dysuria, frequency or hematuria.  No polyuria, polydipsia, polyphagia or blurred vision.   Lab work: CBC showed a white count of 14.2, hemoglobin 14.6 g/dL and platelets 692.  BMP showed potassium of 3.4 and CO2 of 17 mmol/L with a normal anion gap, rest of the electrolytes are normal after sodium is corrected.  Glucose 380, BUN 42 and creatinine 1.53 mg/dL.  Imaging: 2 view chest radiograph with no acute findings.  Left hip x-ray showing left intertrochanteric femur fracture.  CT head without contrast with no acute intracranial abnormality.  C-spine CT with no acute abnormality.  There are some DJD changes.  CT chest/abdomen/pelvis showing a large renal 9.2 cm solid, exophytic right upper pole renal mass that is new since 2018.  No metastatic disease identified.  Urology consult recommended.  Minimally displaced left  posterolateral 10th rib fracture and nondisplaced left posterolateral ninth rib fracture.  Abundant retained stool in the rectosigmoid colon, including a 9 cm rectal stool ball.  No large bowel obstruction or inflammation identified.  ED course: Initial vital signs were temperature 98.7 F, pulse 92, respiration 18, BP 193/94 mmHg and O2 sat 98% on room air.  Patient received hydromorphone  0.5 mg IVP x 2, ondansetron  4 mg IVP x 2, KCl 40 mEq p.o. x 1 and 1000 mL normal saline bolus.   Review of Systems: As mentioned in the history of present illness. All other systems reviewed and are negative. Past Medical History:  Diagnosis Date   Anxiety    Borderline hyperlipidemia    CAD (coronary artery disease)    Colon polyps    Complication of anesthesia    trouble urinating after anesthesia   Diverticulosis of colon    DJD (degenerative joint disease)    DM (diabetes mellitus) (HCC)    Adult onset  type 2   History of gastritis    History of kidney stones    surgery to remove   History of pyelonephritis    History of syncope    HTN (hypertension)    Hypertrophy of prostate with urinary obstruction and other lower urinary tract symptoms (LUTS)    Melanoma (HCC)    back of neck   Melanoma (HCC) 2022   per dermatology   Myoclonus    Pancreatitis due to biliary obstruction (HCC) several years ago   treated by Dr. Princella Nida   Past Surgical History:  Procedure Laterality Date   APPENDECTOMY  cartarized  nose blood vessel     left side   CATARACT EXTRACTION     RIGHT EYE   CHOLECYSTECTOMY     cystoscopy  04/17/11   stent placed   CYSTOSCOPY W/ URETERAL STENT PLACEMENT  04/17/2011   Procedure: CYSTOSCOPY WITH RETROGRADE PYELOGRAM/URETERAL STENT PLACEMENT;  Surgeon: Thomasine Oiler, MD;  Location: [facility];  Service: Urology;  Laterality: Left;   CYSTOSCOPY WITH RETROGRADE PYELOGRAM, URETEROSCOPY AND STENT PLACEMENT Left 04/18/2013   Procedure: CYSTOSCOPY WITH LEFT RETROGRADE PYELOGRAM,  LEFT URETEROSCOPY AND LASER LITHOTRIPSY LEFT STENT PLACEMENT;  Surgeon: Noretta Ferrara, MD;  Location: [facility];  Service: Urology;  Laterality: Left;   HOLMIUM LASER APPLICATION Left 04/18/2013   Procedure: HOLMIUM LASER APPLICATION;  Surgeon: Noretta Ferrara, MD;  Location: [facility];  Service: Urology;  Laterality: Left;   INCISION AND DRAINAGE ABSCESS Left 04/04/2020   Procedure: SELBY OF LEFT ARM HEMATOMA;  Surgeon: Aron Shoulders, MD;  Location: [facility];  Service: General;  Laterality: Left;  left   IRRIGATION AND DEBRIDEMENT ABSCESS Left 10/05/2014   Procedure: WILHEMENA D LEFT INDEX FINGER;  Surgeon: Balinda Rogue, MD;  Location: [facility];  Service: Plastics;  Laterality: Left;   KNEE SURGERY     bilateral ARTHROSCOPY X2 TO EACH KNEE   MELANOMA EXCISION  2018   MELANOMA EXCISION WITH SENTINEL LYMPH NODE BIOPSY Left 03/29/2020   Procedure: WIDE LOCAL EXCISION, ADVANCED FLAP CLOSURE LEFT ARM MELANOMA DEFECT WITH SENTINEL LYMPH NODE MAPPING AND BIOPSY;  Surgeon: Aron Shoulders, MD;  Location: [facility];  Service: General;  Laterality: Left;   S/P cysto & stents for kidnedy stone 11/08 by Dr. Amiel     S/P ELap 1986 w/excision of leiomyoma @ GE junction, Meckle's divertic resected, & cholecystectomy     SHOULDER SURGERY  04/2005   left by Dr. Yvone   Social History:  reports that he has never smoked. He has never been exposed to tobacco smoke. He has never used smokeless tobacco. He reports that he does not drink alcohol and does not use drugs.  Allergies  Allergen Reactions   Morphine  Nausea Only, Other (See Comments) and Nausea And Vomiting    Can take with food and usually doesn't cause nausea   Atorvastatin      Intolerant - nausea/myalgias   Codeine Phosphate Nausea And Vomiting   Metformin  And Related Diarrhea    Family History  Problem Relation Age of Onset   Heart attack Mother    Dementia Brother    Colon cancer Neg Hx    Prostate cancer Neg Hx     Prior to Admission medications   Medication  Sig Start Date End Date Taking? Authorizing Provider  Accu-Chek Softclix Lancets lancets TEST BLOOD SUGAR UP TO THREE TIMES DAILY 01/23/20   [name], MD  amLODipine  (NORVASC ) 10 MG tablet Take 5 mg by mouth daily. 06/14/23   [provider]  aspirin  EC 81 MG tablet Take 81 mg by mouth every evening.    [provider]  Blood Glucose Calibration (ACCU-CHEK AVIVA) SOLN Use as directed monthly. E11.59  Insulin  dependent 12/06/18   [name], MD  Blood Glucose Monitoring Suppl (CONTOUR BLOOD GLUCOSE SYSTEM) w/Device KIT 1 each by Does not apply route 3 (three) times daily. 11/09/23   [name], MD  carvedilol  (COREG ) 12.5 MG tablet TAKE 1 TABLET BY MOUTH TWICE  DAILY WITH MEALS 11/13/23   [name], MD  COD LIVER OIL PO Take 1 capsule by  mouth 2 (two) times daily.     [provider]  cyanocobalamin  (VITAMIN B12) 1000 MCG tablet Take 1 tablet (1,000 mcg total) by mouth daily. 09/29/23   [name], MD  diclofenac  Sodium (VOLTAREN ) 1 % GEL Apply 2 g topically 4 (four) times daily. 06/11/23   Vann, Jessica U, DO  DROPLET PEN NEEDLES 30G X 8 MM MISC USE  WITH  INSULIN   PEN 02/03/19   [name], MD  empagliflozin  (JARDIANCE ) 10 MG TABS tablet Take 1 tablet (10 mg total) by mouth daily before breakfast. Bill secondary: BIN [id number], PCN [id number], GRP [id number], ID [id number] 10/09/23   [name], MD  escitalopram  (LEXAPRO ) 20 MG tablet Take 1 tablet (20 mg total) by mouth daily. 09/29/23   [name], MD  furosemide  (LASIX ) 40 MG tablet Take 1 tablet (40 mg total) by mouth daily. Can take an additional 20mg  as needed for weight gain of 2 lbs in a day or 5lbs in a week 06/23/23   Wyn Jackee VEAR Mickey., NP  glucose blood (CONTOUR TEST) test strip Use as instructed 11/09/23   [name], MD  glucose blood (ONETOUCH ULTRA) test strip USE TO CHECK UP TO 3 TIMES DAILY AS NEEDED 09/29/23   [name], MD  hydrALAZINE  (APRESOLINE ) 50 MG tablet  TAKE 1 TABLET BY MOUTH 3 TIMES  DAILY 11/13/23   [name], MD  insulin  lispro (HUMALOG  KWIKPEN) 100 UNIT/ML KwikPen INJECT SUBCUTANEOUSLY 4 TO 10  UNITS 3 TIMES DAILY BEFORE MEALS PER SLIDING SCALE: 151-200 = 4  UN, 201-250 = 6 UN, 251-300 = 8  UN, AND OVER 300 = 10 UN 07/08/23   [name], MD  losartan  (COZAAR ) 25 MG tablet Take 1 tablet (25 mg total) by mouth daily. 04/17/23 04/16/24  Vernon Ranks, MD  meclizine  (ANTIVERT ) 12.5 MG tablet Take 1 tablet (12.5 mg total) by mouth 3 (three) times daily as needed for dizziness. 09/29/23   [name], MD  meloxicam  (MOBIC ) 7.5 MG tablet Take 1 tablet (7.5 mg total) by mouth daily. With food.  Don't take aleve  or ibuprofen . 09/29/23   [name], MD  ondansetron  (ZOFRAN ) 4 MG tablet Take 1 tablet (4 mg total) by mouth every 6 (six) hours. 03/06/23   Kingsley, Victoria K, DO  polyethylene glycol powder (GLYCOLAX /MIRALAX ) 17 GM/SCOOP powder Take 17 g by mouth daily as needed. 09/30/23   [name], MD  potassium chloride  SA (KLOR-CON  M) 20 MEQ tablet Take 2 tablets (40 mEq total) by mouth daily. 06/12/23   Vann, Jessica U, DO  tamsulosin  (FLOMAX ) 0.4 MG CAPS capsule TAKE 1 CAPSULE BY MOUTH DAILY  AFTER SUPPER 08/19/23   [name], MD  [name]  SOLOSTAR 300 UNIT/ML Solostar Pen INJECT SUBCUTANEOUSLY 16 UNITS  AT BEDTIME 11/13/23   [name], MD  traMADol  (ULTRAM ) 50 MG tablet TAKE 1 TABLET BY MOUTH EVERY 12 HOURS AS NEEDED FOR KNEE PAIN. 09/29/23   [name], MD    Physical Exam: Vitals:   11/16/23 0648 11/16/23 0722 11/16/23 0920 11/16/23 1104  BP: (!) 192/94  128/84 (!) 148/74  Pulse: 92  94 (!) 101  Resp: 18  16 18   Temp: 98.7 F (37.1 C)   98.6 F (37 C)  TempSrc: Oral   Oral  SpO2: 98% 96% 98% 98%   Physical Exam Vitals and nursing note reviewed.  Constitutional:      General: He is awake. He  is not in acute distress.    Appearance: Normal appearance. He is ill-appearing.  HENT:     Head: Normocephalic.      Nose: No rhinorrhea.     Mouth/Throat:     Mouth: Mucous membranes are moist.  Eyes:     General: No scleral icterus.    Pupils: Pupils are equal, round, and reactive to light.  Neck:     Vascular: No JVD.  Cardiovascular:     Rate and Rhythm: Normal rate and regular rhythm.     Heart sounds: S1 normal and S2 normal.  Pulmonary:     Effort: Pulmonary effort is normal.     Breath sounds: Normal breath sounds. No wheezing, rhonchi or rales.  Abdominal:     General: Bowel sounds are normal. There is no distension.     Palpations: Abdomen is soft.     Tenderness: There is no abdominal tenderness. There is no right CVA tenderness or left CVA tenderness.  Musculoskeletal:     Cervical back: Neck supple.     Right lower leg: No edema.     Left lower leg: No edema.  Skin:    General: Skin is warm and dry.  Neurological:     General: No focal deficit present.     Mental Status: He is alert and oriented to person, place, and time.  Psychiatric:        Mood and Affect: Mood normal.        Behavior: Behavior normal. Behavior is cooperative.     Data Reviewed:  Results are pending, will review when available.  04/09/2023 ECHOCARDIOGRAM REPORT   IMPRESSIONS:   1. Left ventricular ejection fraction, by estimation, is 35 to 40%. The left ventricle has moderately decreased function. The left ventricle demonstrates global hypokinesis. The left ventricular internal cavity size was mildly dilated. Left ventricular diastolic parameters are consistent with Grade II diastolic dysfunction (pseudonormalization).  2. Right ventricular systolic function is normal. The right ventricular size is moderately enlarged. Tricuspid regurgitation signal is inadequate for assessing PA pressure.  3. Left atrial size was mildly dilated.  4. The mitral valve is normal in structure. Trivial mitral valve regurgitation. No evidence of mitral stenosis.  5. The aortic valve was not well visualized. Aortic  valve regurgitation is not visualized. No aortic stenosis is present.  6. The inferior vena cava is dilated in size with <50% respiratory variability, suggesting right atrial pressure of 15 mmHg.  7. Aortic dilatation noted. There is mild dilatation of the aortic root, measuring 40 mm, but  8. Within normal limits for age when indexed to body surface area.  Comparison(s): Prior images reviewed side by side. Left ventricle if more dilated from prior. Function appears worse (prior study uses echo-contrast).    EKG: Vent. rate 97 BPM PR interval 221 ms QRS duration 113 ms QT/QTcB 359/456 ms P-R-T axes 8 34 33 Sinus rhythm Prolonged PR interval Consider anterior infarct  Assessment and Plan: Principal Problem:   Closed left hip fracture (HCC) Admit to telemetry/inpatient. Ice area as needed. Buck's traction per protocol. Analgesics as needed. Antiemetics as needed. Consult TOC team. Consult nutritional services. PT evaluation after surgery. Orthopedic surgery evaluation appreciated.  Active Problems:   AKI (acute kidney injury) Time-limited/gentle IV fluids. Hold ARB/ACE. Hold diuretic. Avoid hypotension. Avoid nephrotoxins. Monitor intake and output. Monitor renal function electrolytes.    Hypertension associated with diabetes (HCC) Continue amlodipine  5 mg p.o. daily. Continue carvedilol  12.5 mg p.o. twice daily. Continue hydralazine   50 mg p.o. 3 times daily. Holding furosemide  and losartan .    Chronic combined systolic and diastolic congestive heart failure (HCC) Seems to be volume depleted. Continue antihypertensives as above.    Type 2 diabetes mellitus with vascular disease (HCC) Carbohydrate modified diet. CBG monitoring with RI SS. Continue Lantus  16 units SQ at bedtime.    Hypokalemia Replenishing. Follow level in the morning.    CAD (coronary artery disease) Continue ASA and carvedilol .    BPH with obstruction/lower urinary tract  symptoms Continue alpha-blocker.    Obesity, class 1 Current weight and BMI still pending. Would benefit from lifestyle modifications.    HLD (hyperlipidemia) Intolerant to statins.    Pseudohyponatremia Secondary to hyperglycemia. Continue IV fluids and DM treatment    Advance Care Planning:   Code Status: Full Code   Consults:   Family Communication:   Severity of Illness: The appropriate patient status for this patient is INPATIENT. Inpatient status is judged to be reasonable and necessary in order to provide the required intensity of service to ensure the patient's safety. The patient's presenting symptoms, physical exam findings, and initial radiographic and laboratory data in the context of their chronic comorbidities is felt to place them at high risk for further clinical deterioration. Furthermore, it is not anticipated that the patient will be medically stable for discharge from the hospital within 2 midnights of admission.   * I certify that at the point of admission it is my clinical judgment that the patient will require inpatient hospital care spanning beyond 2 midnights from the point of admission due to high intensity of service, high risk for further deterioration and high frequency of surveillance required.*  Author: Alm Dorn Castor, MD 11/16/2023 11:36 AM  For on call review [email or web address].   This document was prepared using Dragon voice recognition software and may contain some unintended transcription errors.

## 2023-11-16 NOTE — ED Triage Notes (Signed)
 Pt arrives EMS from home with reports of fall from standing. Pt reports hitting the night stand on his left side ribcage and has pain down side and down leg. Pt reports hitting right side forehead as well. Pt denies LOC and denies taking any blood thinners.

## 2023-11-17 ENCOUNTER — Encounter (HOSPITAL_COMMUNITY): Payer: Self-pay

## 2023-11-17 ENCOUNTER — Encounter (HOSPITAL_COMMUNITY)
Admission: EM | Disposition: A | Discharge status: Skilled Nursing Facility | Payer: Self-pay | Source: Home / Self Care | Attending: Internal Medicine

## 2023-11-17 ENCOUNTER — Inpatient Hospital Stay (HOSPITAL_COMMUNITY)

## 2023-11-17 ENCOUNTER — Inpatient Hospital Stay (HOSPITAL_COMMUNITY): Admitting: Anesthesiology

## 2023-11-17 ENCOUNTER — Other Ambulatory Visit: Payer: Self-pay

## 2023-11-17 ENCOUNTER — Telehealth: Payer: Self-pay

## 2023-11-17 DIAGNOSIS — I5042 Chronic combined systolic (congestive) and diastolic (congestive) heart failure: Secondary | ICD-10-CM | POA: Diagnosis not present

## 2023-11-17 DIAGNOSIS — S72142A Displaced intertrochanteric fracture of left femur, initial encounter for closed fracture: Secondary | ICD-10-CM

## 2023-11-17 DIAGNOSIS — F419 Anxiety disorder, unspecified: Secondary | ICD-10-CM | POA: Diagnosis not present

## 2023-11-17 DIAGNOSIS — W19XXXA Unspecified fall, initial encounter: Secondary | ICD-10-CM

## 2023-11-17 DIAGNOSIS — S72002A Fracture of unspecified part of neck of left femur, initial encounter for closed fracture: Secondary | ICD-10-CM | POA: Diagnosis not present

## 2023-11-17 DIAGNOSIS — I110 Hypertensive heart disease with heart failure: Secondary | ICD-10-CM | POA: Diagnosis not present

## 2023-11-17 HISTORY — PX: INTRAMEDULLARY (IM) NAIL INTERTROCHANTERIC: SHX5875

## 2023-11-17 LAB — CBC
HCT: 35.9 % — ABNORMAL LOW (ref 39.0–52.0)
Hemoglobin: 11.4 g/dL — ABNORMAL LOW (ref 13.0–17.0)
MCH: 29.9 pg (ref 26.0–34.0)
MCHC: 31.8 g/dL (ref 30.0–36.0)
MCV: 94.2 fL (ref 80.0–100.0)
Platelets: 250 K/uL (ref 150–400)
RBC: 3.81 MIL/uL — ABNORMAL LOW (ref 4.22–5.81)
RDW: 13.6 % (ref 11.5–15.5)
WBC: 12.2 K/uL — ABNORMAL HIGH (ref 4.0–10.5)
nRBC: 0 % (ref 0.0–0.2)

## 2023-11-17 LAB — COMPREHENSIVE METABOLIC PANEL WITH GFR
ALT: 14 U/L (ref 0–44)
AST: 16 U/L (ref 15–41)
Albumin: 3.6 g/dL (ref 3.5–5.0)
Alkaline Phosphatase: 58 U/L (ref 38–126)
Anion gap: 12 (ref 5–15)
BUN: 43 mg/dL — ABNORMAL HIGH (ref 8–23)
CO2: 23 mmol/L (ref 22–32)
Calcium: 9.4 mg/dL (ref 8.9–10.3)
Chloride: 102 mmol/L (ref 98–111)
Creatinine, Ser: 1.62 mg/dL — ABNORMAL HIGH (ref 0.61–1.24)
GFR, Estimated: 41 mL/min — ABNORMAL LOW (ref >60)
Glucose, Bld: 223 mg/dL — ABNORMAL HIGH (ref 70–99)
Potassium: 3.6 mmol/L (ref 3.5–5.1)
Sodium: 137 mmol/L (ref 135–145)
Total Bilirubin: 0.5 mg/dL (ref 0.0–1.2)
Total Protein: 5.6 g/dL — ABNORMAL LOW (ref 6.5–8.1)

## 2023-11-17 LAB — GLUCOSE, CAPILLARY
Glucose-Capillary: 232 mg/dL — ABNORMAL HIGH (ref 70–99)
Glucose-Capillary: 218 mg/dL — ABNORMAL HIGH (ref 70–99)
Glucose-Capillary: 223 mg/dL — ABNORMAL HIGH (ref 70–99)
Glucose-Capillary: 188 mg/dL — ABNORMAL HIGH (ref 70–99)
Glucose-Capillary: 152 mg/dL — ABNORMAL HIGH (ref 70–99)

## 2023-11-17 SURGERY — FIXATION, FRACTURE, INTERTROCHANTERIC, WITH INTRAMEDULLARY ROD
Anesthesia: General | Site: Hip | Laterality: Left

## 2023-11-17 MED ORDER — CEFAZOLIN SODIUM-DEXTROSE 2-4 GM/100ML-% IV SOLN
INTRAVENOUS | Status: AC
Start: 2023-11-17 — End: 2023-11-17
  Filled 2023-11-17: qty 100

## 2023-11-17 MED ORDER — FENTANYL CITRATE PF 50 MCG/ML IJ SOSY: 25.0000 ug | PREFILLED_SYRINGE | INTRAMUSCULAR | Status: DC | PRN

## 2023-11-17 MED ORDER — SUCCINYLCHOLINE CHLORIDE 200 MG/10ML IV SOSY
PREFILLED_SYRINGE | INTRAVENOUS | Status: DC | PRN
  Administered 2023-11-17: 140 mg via INTRAVENOUS

## 2023-11-17 MED ORDER — POVIDONE-IODINE 10 % EX SWAB: 2.0000 | Freq: Once | CUTANEOUS | Status: DC

## 2023-11-17 MED ORDER — ONDANSETRON HCL 4 MG/2ML IJ SOLN
INTRAMUSCULAR | Status: AC
  Filled 2023-11-17: qty 2

## 2023-11-17 MED ORDER — FENTANYL CITRATE (PF) 100 MCG/2ML IJ SOLN
INTRAMUSCULAR | Status: DC | PRN
  Administered 2023-11-17: 50 ug via INTRAVENOUS

## 2023-11-17 MED ORDER — CHLORHEXIDINE GLUCONATE 0.12 % MT SOLN: 15.0000 mL | Freq: Once | OROMUCOSAL | Status: DC

## 2023-11-17 MED ORDER — TRANEXAMIC ACID-NACL 1000-0.7 MG/100ML-% IV SOLN
INTRAVENOUS | Status: AC
  Filled 2023-11-17: qty 100

## 2023-11-17 MED ORDER — PROPOFOL 500 MG/50ML IV EMUL
INTRAVENOUS | Status: DC | PRN
  Administered 2023-11-17: 100 ug/kg/min via INTRAVENOUS

## 2023-11-17 MED ORDER — LACTATED RINGERS IV SOLN: INTRAVENOUS | Status: DC

## 2023-11-17 MED ORDER — ONDANSETRON HCL 4 MG/2ML IJ SOLN
INTRAMUSCULAR | Status: DC | PRN
Start: 2023-11-17 — End: 2023-11-17
  Administered 2023-11-17: 4 mg via INTRAVENOUS

## 2023-11-17 MED ORDER — BUPIVACAINE-EPINEPHRINE (PF) 0.25% -1:200000 IJ SOLN
INTRAMUSCULAR | Status: AC
Start: 2023-11-17 — End: 2023-11-17
  Filled 2023-11-17: qty 30

## 2023-11-17 MED ORDER — ROCURONIUM BROMIDE 10 MG/ML (PF) SYRINGE
PREFILLED_SYRINGE | INTRAVENOUS | Status: AC
  Filled 2023-11-17: qty 10

## 2023-11-17 MED ORDER — PROPOFOL 10 MG/ML IV BOLUS
INTRAVENOUS | Status: DC | PRN
  Administered 2023-11-17: 70 mg via INTRAVENOUS

## 2023-11-17 MED ORDER — ACETAMINOPHEN 10 MG/ML IV SOLN: 1000.0000 mg | Freq: Once | INTRAVENOUS | Status: DC | PRN

## 2023-11-17 MED ORDER — ADULT MULTIVITAMIN W/MINERALS CH
1.0000 | ORAL_TABLET | Freq: Every day | ORAL | Status: DC
  Administered 2023-11-18 – 2023-11-24 (×7): 1 via ORAL
  Filled 2023-11-17 (×7): qty 1

## 2023-11-17 MED ORDER — INSULIN ASPART 100 UNIT/ML IJ SOLN
7.0000 [IU] | Freq: Once | INTRAMUSCULAR | Status: AC
  Administered 2023-11-17: 7 [IU] via SUBCUTANEOUS

## 2023-11-17 MED ORDER — FLEET ENEMA RE ENEM
1.0000 | ENEMA | Freq: Once | RECTAL | Status: AC
  Administered 2023-11-17: 1 via RECTAL
  Filled 2023-11-17: qty 1

## 2023-11-17 MED ORDER — PROPOFOL 1000 MG/100ML IV EMUL
INTRAVENOUS | Status: AC
  Filled 2023-11-17: qty 100

## 2023-11-17 MED ORDER — DEXAMETHASONE SODIUM PHOSPHATE 10 MG/ML IJ SOLN
INTRAMUSCULAR | Status: AC
Start: 2023-11-17 — End: 2023-11-17
  Filled 2023-11-17: qty 1

## 2023-11-17 MED ORDER — PROPOFOL 10 MG/ML IV BOLUS
INTRAVENOUS | Status: AC
Start: 2023-11-17 — End: 2023-11-17
  Filled 2023-11-17: qty 20

## 2023-11-17 MED ORDER — CEFAZOLIN SODIUM-DEXTROSE 1-4 GM/50ML-% IV SOLN
1.0000 g | Freq: Three times a day (TID) | INTRAVENOUS | Status: AC
  Administered 2023-11-17 – 2023-11-18 (×3): 1 g via INTRAVENOUS
  Filled 2023-11-17 (×4): qty 50

## 2023-11-17 MED ORDER — SUGAMMADEX SODIUM 200 MG/2ML IV SOLN
INTRAVENOUS | Status: DC | PRN
  Administered 2023-11-17: 200 mg via INTRAVENOUS

## 2023-11-17 MED ORDER — ENSURE MAX PROTEIN PO LIQD
11.0000 [oz_av] | Freq: Two times a day (BID) | ORAL | Status: DC
  Administered 2023-11-18 – 2023-11-23 (×10): 11 [oz_av] via ORAL
  Filled 2023-11-17 (×14): qty 330

## 2023-11-17 MED ORDER — LACTATED RINGERS IV SOLN: INTRAVENOUS | Status: DC | PRN

## 2023-11-17 MED ORDER — ASPIRIN 81 MG PO TBEC
81.0000 mg | DELAYED_RELEASE_TABLET | Freq: Two times a day (BID) | ORAL | Status: DC
  Administered 2023-11-17 – 2023-11-21 (×8): 81 mg via ORAL
  Filled 2023-11-17 (×8): qty 1

## 2023-11-17 MED ORDER — SENNOSIDES-DOCUSATE SODIUM 8.6-50 MG PO TABS
1.0000 | ORAL_TABLET | Freq: Two times a day (BID) | ORAL | Status: DC
  Administered 2023-11-17 – 2023-11-24 (×14): 1 via ORAL
  Filled 2023-11-17 (×14): qty 1

## 2023-11-17 MED ORDER — ONDANSETRON HCL 4 MG/2ML IJ SOLN: 4.0000 mg | Freq: Once | INTRAMUSCULAR | Status: DC | PRN

## 2023-11-17 MED ORDER — ALBUMIN HUMAN 5 % IV SOLN: INTRAVENOUS | Status: DC | PRN

## 2023-11-17 MED ORDER — BUPIVACAINE-EPINEPHRINE (PF) 0.25% -1:200000 IJ SOLN
INTRAMUSCULAR | Status: DC | PRN
Start: 2023-11-17 — End: 2023-11-17
  Administered 2023-11-17: 30 mL via PERINEURAL

## 2023-11-17 MED ORDER — SUGAMMADEX SODIUM 200 MG/2ML IV SOLN
INTRAVENOUS | Status: AC
  Filled 2023-11-17: qty 2

## 2023-11-17 MED ORDER — BUPIVACAINE LIPOSOME 1.3 % IJ SUSP: 10.0000 mL | Freq: Once | INTRAMUSCULAR | Status: DC

## 2023-11-17 MED ORDER — CEFAZOLIN SODIUM-DEXTROSE 2-4 GM/100ML-% IV SOLN
2.0000 g | INTRAVENOUS | Status: AC
  Administered 2023-11-17: 2 g via INTRAVENOUS
  Filled 2023-11-17: qty 100

## 2023-11-17 MED ORDER — KETAMINE HCL 10 MG/ML IJ SOLN
INTRAMUSCULAR | Status: AC
  Filled 2023-11-17: qty 1

## 2023-11-17 MED ORDER — LIDOCAINE HCL (CARDIAC) PF 100 MG/5ML IV SOSY
PREFILLED_SYRINGE | INTRAVENOUS | Status: DC | PRN
  Administered 2023-11-17: 100 mg via INTRAVENOUS

## 2023-11-17 MED ORDER — TRANEXAMIC ACID-NACL 1000-0.7 MG/100ML-% IV SOLN
1000.0000 mg | Freq: Once | INTRAVENOUS | Status: AC
  Administered 2023-11-17: 1000 mg via INTRAVENOUS

## 2023-11-17 MED ORDER — PHENYLEPHRINE HCL (PRESSORS) 10 MG/ML IV SOLN
INTRAVENOUS | Status: DC | PRN
  Administered 2023-11-17 (×3): 80 ug via INTRAVENOUS

## 2023-11-17 MED ORDER — POLYETHYLENE GLYCOL 3350 17 G PO PACK
17.0000 g | PACK | Freq: Two times a day (BID) | ORAL | Status: DC
  Administered 2023-11-17 – 2023-11-24 (×13): 17 g via ORAL
  Filled 2023-11-17 (×12): qty 1

## 2023-11-17 MED ORDER — 0.9 % SODIUM CHLORIDE (POUR BTL) OPTIME
TOPICAL | Status: DC | PRN
  Administered 2023-11-17: 1000 mL

## 2023-11-17 MED ORDER — OXYCODONE HCL 5 MG PO TABS: 5.0000 mg | ORAL_TABLET | Freq: Once | ORAL | Status: DC | PRN

## 2023-11-17 MED ORDER — OXYCODONE HCL 5 MG/5ML PO SOLN: 5.0000 mg | Freq: Once | ORAL | Status: DC | PRN

## 2023-11-17 MED ORDER — PHENYLEPHRINE HCL-NACL 20-0.9 MG/250ML-% IV SOLN
INTRAVENOUS | Status: DC | PRN
Start: 2023-11-17 — End: 2023-11-17
  Administered 2023-11-17: 50 ug/min via INTRAVENOUS

## 2023-11-17 MED ORDER — FENTANYL CITRATE PF 50 MCG/ML IJ SOSY: 50.0000 ug | PREFILLED_SYRINGE | Freq: Once | INTRAMUSCULAR | Status: DC

## 2023-11-17 MED ORDER — ROCURONIUM BROMIDE 10 MG/ML (PF) SYRINGE
PREFILLED_SYRINGE | INTRAVENOUS | Status: DC | PRN
  Administered 2023-11-17: 30 mg via INTRAVENOUS

## 2023-11-17 MED ORDER — FENTANYL CITRATE (PF) 100 MCG/2ML IJ SOLN
INTRAMUSCULAR | Status: AC
  Filled 2023-11-17: qty 2

## 2023-11-17 MED ORDER — SODIUM CHLORIDE 0.45 % IV SOLN
INTRAVENOUS | Status: AC
Start: 2023-11-17 — End: 2023-11-18

## 2023-11-17 MED ORDER — LIDOCAINE HCL (PF) 2 % IJ SOLN
INTRAMUSCULAR | Status: AC
  Filled 2023-11-17: qty 5

## 2023-11-17 SURGICAL SUPPLY — 33 items
BAG COUNTER SPONGE SURGICOUNT (BAG) IMPLANT
BIT DRILL INTERTAN LAG SCREW (BIT) IMPLANT
BIT DRILL LONG 4.0 (BIT) IMPLANT
CHLORAPREP W/TINT 26 (MISCELLANEOUS) ×2 IMPLANT
COVER PERINEAL POST (MISCELLANEOUS) ×2 IMPLANT
COVER SURGICAL LIGHT HANDLE (MISCELLANEOUS) IMPLANT
DRAPE C-ARM 42X120 X-RAY (DRAPES) ×2 IMPLANT
DRAPE C-ARMOR (DRAPES) ×1 IMPLANT
DRAPE INCISE IOBAN 66X45 STRL (DRAPES) IMPLANT
DRAPE STERI IOBAN 125X83 (DRAPES) ×2 IMPLANT
DRSG TEGADERM 4X4.75 (GAUZE/BANDAGES/DRESSINGS) ×8 IMPLANT
DRSG XEROFORM 1X8 (GAUZE/BANDAGES/DRESSINGS) IMPLANT
ELECT PENCIL ROCKER SW 15FT (MISCELLANEOUS) ×2 IMPLANT
ELECT REM PT RETURN 15FT ADLT (MISCELLANEOUS) IMPLANT
GAUZE SPONGE 4X4 12PLY STRL (GAUZE/BANDAGES/DRESSINGS) ×2 IMPLANT
GAUZE XEROFORM 1X8 LF (GAUZE/BANDAGES/DRESSINGS) ×1 IMPLANT
GLOVE BIO SURGEON STRL SZ8 (GLOVE) ×4 IMPLANT
GLOVE BIOGEL PI IND STRL 8 (GLOVE) ×2 IMPLANT
GOWN STRL REUS W/ TWL XL LVL3 (GOWN DISPOSABLE) ×2 IMPLANT
KIT BASIN OR (CUSTOM PROCEDURE TRAY) ×2 IMPLANT
KIT TURNOVER KIT A (KITS) ×2 IMPLANT
MANIFOLD NEPTUNE II (INSTRUMENTS) ×1 IMPLANT
NAIL TRIGEN INTERTAN 10X18CM (Nail) IMPLANT
PACK GENERAL/GYN (CUSTOM PROCEDURE TRAY) ×2 IMPLANT
PIN GUIDE 3.2X343MM (PIN) IMPLANT
SCREW LAG COMPR KIT 105/100 (Screw) IMPLANT
SCREW TRIGEN LOW PROF 5.0X37.5 (Screw) IMPLANT
STAPLER SKIN PROX 35W (STAPLE) IMPLANT
SUT VIC AB 0 CT1 27XBRD ANTBC (SUTURE) ×2 IMPLANT
SUT VIC AB 0 CT1 36 (SUTURE) ×1 IMPLANT
SUT VIC AB 1 CT1 27XBRD ANBCTR (SUTURE) ×2 IMPLANT
SUT VIC AB 2-0 CT1 TAPERPNT 27 (SUTURE) ×2 IMPLANT
TOWEL OR 17X26 10 PK STRL BLUE (TOWEL DISPOSABLE) ×2 IMPLANT

## 2023-11-17 NOTE — Progress Notes (Signed)
 PROGRESS NOTE    [name]  FMW:[id number] DOB: 1935-01-11 DOA: 11/16/2023 PCP: [name], MD   Brief Narrative: 89 year old with past medical history significant for anxiety, hyperlipidemia, CAD, colon polyps, colon diverticulosis, diabetes type 2, gastritis, nephrolithiasis, history of pyelonephritis, history of syncopal episode, hypertension, BPH, melanoma, myoclonus, biliary pancreatitis who fell at home and hit his left hip and left flank and left mid back.  No loss of consciousness.  He was unable to stand up and bear weight on his left lower extremity.  Evaluation in the ED he was found to have left intertrochanteric femur fracture.  CT head without contrast no acute intracranial abnormality.  C-spine CT no acute abnormality.  CT chest abdomen and pelvis show a large renal 9.2 cm solid exophytic right upper pole renal mass new since 2018.  Minimal displaced left posterolateral 10th rib fracture and nondisplaced left posterior lateral ninth rib fracture.  Retained stool in the rectosigmoid colon.  Including a 9 cm rectal stool ball.    Assessment & Plan:   Principal Problem:   Closed left hip fracture (HCC) Active Problems:   Hypertension associated with diabetes (HCC)   Type 2 diabetes mellitus with vascular disease (HCC)   Hypokalemia   CAD (coronary artery disease)   BPH with obstruction/lower urinary tract symptoms   Obesity, class 1   HLD (hyperlipidemia)   Chronic combined systolic and diastolic congestive heart failure (HCC)   AKI (acute kidney injury)   Pseudohyponatremia   1-Close left hip fracture: - S/p mechanical fall found to have left intertrochanteric femur fracture. - Orthopedic consulted, plan for surgical intervention 10/7 - Pain management and bowel regimen ordered.   Renal mass: - Incidental finding on CT scan. - Son made aware of CT scan results - Patient will need to follow-up with urology   Severe constipation: -CT showed 9 cm rectal  stool ball - Fleet enema ordered for after the surgery -Started MiraLAX  and Senokot  AKI - Previous creatinine range 1.1--- 1.2 (4 month ago) -He has had previously creatinine of 1.6, 6 months ago. - Presented with a creatinine of 1.6 - Continue with IV fluids -Avoid hypotension and nephrotoxins  Hypertension - Continue Norvasc  and Coreg   Diabetes type 2 with vascular disease - Continue with Lantus  and sliding scale - Hold  Jardiance , due to AKI  Hypokalemia - Continue with Norvasc  and carvedilol   CAD - Continue carvedilol   BPH - Continue with Flomax   Obesity class I - Lifestyle modification  Hyperlipidemia  Pseudohyponatremia - Setting of hyperglycemia.  Continue with IV fluids  Depression: Continue Lexapro  Leukocytosis: Follow trend Estimated body mass index is 30.65 kg/m as calculated from the following:   Height as of 10/23/23: 6' (1.829 m).   Weight as of 10/23/23: 102.5 kg.   DVT prophylaxis: Follow Ortho recommendation postsurgery Code Status: Full code Family Communication: Son who was at bedside Disposition Plan:  Status is: Inpatient Remains inpatient appropriate because: Management of hip fracture    Consultants:  Ortho  Procedures:    Antimicrobials:    Subjective: He is alert he complaining of severe hip pain.  He denies chest pain or shortness of breath.  Objective: Vitals:   11/16/23 1445 11/16/23 1603 11/16/23 2004 11/17/23 0424  BP: (!) 157/76 (!) 152/75 (!) 121/54 (!) 147/71  Pulse:  89 82 83  Resp: 18 18 19 19   Temp:  98.1 F (36.7 C) 99.1 F (37.3 C) 98.5 F (36.9 C)  TempSrc:  Oral Oral Oral  SpO2: 95% 98% 92% 94%    Intake/Output Summary (Last 24 hours) at 11/17/2023 0818 Last data filed at 11/16/2023 2000 Gross per 24 hour  Intake --  Output 500 ml  Net -500 ml   There were no vitals filed for this visit.  Examination:  General exam: Appears calm and comfortable  Respiratory system: Clear to auscultation.  Respiratory effort normal. Cardiovascular system: S1 & S2 heard, RRR. Gastrointestinal system: Abdomen is nondistended, soft and nontender. No organomegaly or masses felt. Normal bowel sounds heard. Central nervous system: Alert and oriented Extremities: no edema   Data Reviewed: I have personally reviewed following labs and imaging studies  CBC: Recent Labs  Lab 11/16/23 0820 11/17/23 0546  WBC 14.2* 12.2*  NEUTROABS 10.8*  --   HGB 14.6 11.4*  HCT 44.3 35.9*  MCV 92.1 94.2  PLT 307 250   Basic Metabolic Panel: Recent Labs  Lab 11/16/23 0820 11/17/23 0546  NA 134* 137  K 3.4* 3.6  CL 101 102  CO2 17* 23  GLUCOSE 380* 223*  BUN 42* 43*  CREATININE 1.53* 1.62*  CALCIUM  9.8 9.4   GFR: CrCl cannot be calculated (Unknown ideal weight.). Liver Function Tests: Recent Labs  Lab 11/17/23 0546  AST 16  ALT 14  ALKPHOS 58  BILITOT 0.5  PROT 5.6*  ALBUMIN 3.6   No results for input(s): LIPASE, AMYLASE in the last 168 hours. No results for input(s): AMMONIA in the last 168 hours. Coagulation Profile: No results for input(s): INR, PROTIME in the last 168 hours. Cardiac Enzymes: No results for input(s): CKTOTAL, CKMB, CKMBINDEX, TROPONINI in the last 168 hours. BNP (last 3 results) No results for input(s): PROBNP in the last 8760 hours. HbA1C: No results for input(s): HGBA1C in the last 72 hours. CBG: Recent Labs  Lab 11/16/23 1340 11/16/23 1502 11/16/23 1644 11/16/23 2023 11/17/23 0715  GLUCAP 350* 333* 280* 211* 232*   Lipid Profile: No results for input(s): CHOL, HDL, LDLCALC, TRIG, CHOLHDL, LDLDIRECT in the last 72 hours. Thyroid  Function Tests: No results for input(s): TSH, T4TOTAL, FREET4, T3FREE, THYROIDAB in the last 72 hours. Anemia Panel: No results for input(s): VITAMINB12, FOLATE, FERRITIN, TIBC, IRON, RETICCTPCT in the last 72 hours. Sepsis Labs: No results for input(s): PROCALCITON,  LATICACIDVEN in the last 168 hours.  No results found for this or any previous visit (from the past 240 hours).       Radiology Studies: CT CHEST ABDOMEN PELVIS W CONTRAST Result Date: 11/16/2023 EXAM: CT CHEST, ABDOMEN AND PELVIS WITH CONTRAST 11/16/2023 10:39:42 AM TECHNIQUE: CT of the chest, abdomen and pelvis was performed with the administration of 75 mL of iohexol  (OMNIPAQUE ) 300 MG/ML solution. Both precontrast and postcontrast images of the chest abdomen and pelvis are provided. Multiplanar reformatted images are provided for review. Automated exposure control, iterative reconstruction, and/or weight based adjustment of the mA/kV was utilized to reduce the radiation dose to as low as reasonably achievable. COMPARISON: Chest CT 03/20/2016, chest radiographs 07:51 AM today, CT abdomen and pelvis 01/30/2014. CLINICAL HISTORY: 89 year old male with polytrauma from fall, left ribcage pain, and right forehead injury. FINDINGS: CHEST: MEDIASTINUM AND LYMPH NODES: Advanced chronic calcified coronary artery atherosclerosis calcified coronary artery plaque versus stents. A small anterior pericardial effusion with fairly simple fluid density on series 2 image 48 is larger than in 2018 and 2015 but significance is doubtful. The central airways are clear. No mediastinal, hilar or axillary lymphadenopathy. Left axillary surgical clips are new since 2018. LUNGS AND PLEURA:  Mildly lower lung volumes than in 2018. Similar mild atelectatic changes to the major airways which remain patent. Chronic elevation of the right hemidiaphragm is stable. New bilateral pulmonary mosaics attenuation, but favor a combination of gas trapping and atelectasis in this setting. No pneumothorax. No pleural effusion. No convincing pulmonary contusion. Increased lung base atelectasis, including platelike opacity over the right hemidiaphragm. ABDOMEN AND PELVIS: LIVER: The liver is unremarkable. GALLBLADDER AND BILE DUCTS: Chronic  cholecystectomy. No acute biliary ductal dilatation. SPLEEN: No acute abnormality. PANCREAS: No acute abnormality. ADRENAL GLANDS: No acute abnormality. KIDNEYS, URETERS AND BLADDER: Chronic hyperdense, but benign appearing exophytic left renal upper pole cyst (series 4 image 58) which does not enhance. Per consensus, no follow-up is needed for simple Bosniak type 1 and 2 renal cysts, unless the patient has a malignancy history or risk factors. Left nephrolithiasis is chronic. No left hydronephrosis. Large solid, exophytic, and heterogeneously enhancing mass of the right renal upper pole encompassing 63 x 92 x 73 mm (AP x transverse x CC). This is new from previous exams and consistent with renal cell carcinoma see sagittal image 65 today. No superimposed right renal obstruction, and underlying chronic renal cortical scarring and volume loss. No hydroureter. Urinary bladder is unremarkable. On delayed phase images there is symmetric renal contrast excretion. GI AND BOWEL: Stomach demonstrates no acute abnormality. Abundant retained stool in the rectosigmoid colon including a 9 cm diameter rectal stool ball. Diverticulosis of the descending colon with mild to moderate retained stool. Similar retained stool in the transverse colon. Right colon less affected. No large bowel inflammation identified. Diminutive or absent appendix. Chronic surgical clips in the transverse colon mesentery and also in the distal small bowel with no adverse features stable since 2015. Chronic postoperative changes also at the gastroesophageal junction, stable. There is no bowel obstruction. REPRODUCTIVE ORGANS: Heterogeneous prostate enlargement. PERITONEUM AND RETROPERITONEUM: No ascites. No free air. VASCULATURE: Aorta is normal in caliber. ABDOMINAL AND PELVIS LYMPH NODES: No lymphadenopathy. BONES AND SOFT TISSUES: Minimally displaced left posterolateral 10th rib fracture on series 8 image 116. Nondisplaced left posterolateral 9th rib  fracture best seen on coronal image 47. Diffuse idiopathic skeletal hyperostosis. Associated widespread chronic thoracic spinal interbody ankylosis similar bulky hyperostosis and ankylosis in the partially visible lower cervical spine vertebral height appears maintained and stable. Mildly transitional lumbosacral anatomy, vestigial S1-S2 disc space chronic lumbar spine degeneration including multilevel chronic vacuum disc disease lumbar vertebral height appears stable since 2015. Comminuted proximal left femur intertrochanteric fracture acute, with varus impaction (coronal image 78 and series 2 image 118). The left acetabulum, pubic rami, and pelvis appear to remain intact. No measurable soft tissue hematoma about the left hip. No other acute osseous abnormality is identified in the chest, abdomen, or pelvis. IMPRESSION: 1. Renal Cell Carcinoma: Large 9.2 cm solid, exophytic right upper pole renal mass is new since 2018. No metastatic disease identified. Recommend Urology consultation. 2. Acute comminuted proximal left femur intertrochanteric fracture with varus impaction. 3. Minimally displaced left posterolateral 10th rib fracture and nondisplaced left posterolateral 9th rib fracture. No pneumothorax, pulmonary contusion, or pleural effusion 4. Abundant retained stool in the rectosigmoid colon, including a 9 cm rectal stool ball. No large bowel obstruction or inflammation identified. Electronically signed by: Helayne Hurst MD 11/16/2023 11:04 AM EDT RP Workstation: [id number]   CT Cervical Spine Wo Contrast Result Date: 11/16/2023 EXAM: CT CERVICAL SPINE WITHOUT CONTRAST 11/16/2023 08:36:39 AM TECHNIQUE: CT of the cervical spine was performed without the administration of intravenous contrast.  Multiplanar reformatted images are provided for review. Automated exposure control, iterative reconstruction, and/or weight based adjustment of the mA/kV was utilized to reduce the radiation dose to as low as reasonably  achievable. COMPARISON: CT of the cervical spine dated 06/08/2023. CLINICAL HISTORY: Neck trauma, intoxicated or obtunded (Age >= 16y). Pt reports of fall from standing. Pt reports hitting the night stand on his left side ribcage and has pain down side and down leg. Pt reports hitting right side forehead as well. Pt denies LOC and denies taking any blood thinners. FINDINGS: CERVICAL SPINE: BONES AND ALIGNMENT: No acute fracture or traumatic malalignment. Slight degenerative anterolisthesis at C3-C4. DEGENERATIVE CHANGES: Moderate hypertrophic changes again noted within the atlantoaxial joint resulting in moderate central spinal canal stenosis at the craniocervical junction, as before. There is a posterior bridging osteophyte again demonstrated at C2-C3 also resulting in moderate central spinal canal stenosis. There are anterior bridging osteophytes extending from C4 through T2. SOFT TISSUES: No prevertebral soft tissue swelling. There is calcification within the carotid bulbs. IMPRESSION: 1. No acute abnormality of the cervical spine related to the reported trauma. 2. Moderate hypertrophic changes within the atlantoaxial joint resulting in moderate central spinal canal stenosis at the craniocervical junction, unchanged. 3. Posterior bridging osteophyte at C2-3 resulting in moderate central spinal canal stenosis, unchanged. 4. Anterior bridging osteophytes extending from C4 through T2. Electronically signed by: Evalene Coho MD 11/16/2023 09:21 AM EDT RP Workstation: [id number]   CT Head Wo Contrast Result Date: 11/16/2023 EXAM: CT HEAD WITHOUT CONTRAST 11/16/2023 08:36:39 AM TECHNIQUE: CT of the head was performed without the administration of intravenous contrast. Automated exposure control, iterative reconstruction, and/or weight based adjustment of the mA/kV was utilized to reduce the radiation dose to as low as reasonably achievable. COMPARISON: CT of the head dated 01/22/2023. CLINICAL HISTORY: Head  trauma, intracranial arterial injury suspected. Pt reports of fall from standing. Pt reports hitting the night stand on his left side ribcage and has pain down side and down leg. Pt reports hitting right side forehead as well. Pt denies LOC and denies taking any blood thinners. FINDINGS: BRAIN AND VENTRICLES: No acute hemorrhage. No evidence of acute infarct. No hydrocephalus. No extra-axial collection. No mass effect or midline shift. There is age-related volume loss and mild periventricular white matter disease. There is moderate calcific atheromatous disease within the carotid siphons and vertebral arteries. ORBITS: Patient is status post right lens replacement. SINUSES: No acute abnormality. SOFT TISSUES AND SKULL: No acute soft tissue abnormality. No skull fracture. IMPRESSION: 1. No acute intracranial abnormality. 2. Age-related cerebral volume loss and mild periventricular white matter changes. Electronically signed by: Evalene Coho MD 11/16/2023 08:56 AM EDT RP Workstation: [id number]   DG Chest 2 View Result Date: 11/16/2023 CLINICAL DATA:  Fall, left hip pain.  Left femur fracture. EXAM: CHEST - 2 VIEW COMPARISON:  09/29/2023. FINDINGS: Trachea is midline. Heart size stable. Thoracic aorta is calcified. Linear volume loss in the right lung base adjacent to an elevated right hemidiaphragm. Linear atelectasis or scarring in the lingula. No airspace consolidation or pleural fluid. Degenerative changes in the shoulders. IMPRESSION: No acute findings. Electronically Signed   By: Newell Eke M.D.   On: 11/16/2023 08:31   DG Hip Unilat W or Wo Pelvis 2-3 Views Left Result Date: 11/16/2023 CLINICAL DATA:  Fall, left hip pain. EXAM: DG HIP (WITH OR WITHOUT PELVIS) 2-3V LEFT COMPARISON:  None Available. FINDINGS: Mildly impacted intertrochanteric left femur fracture with varus angulation. No dislocation. IMPRESSION: Left intertrochanteric femur  fracture. Electronically Signed   By: Newell Eke M.D.    On: 11/16/2023 08:30        Scheduled Meds:  amLODipine   5 mg Oral Daily   aspirin  EC  81 mg Oral QPM   carvedilol   12.5 mg Oral BID WC   cyanocobalamin   1,000 mcg Oral Daily   empagliflozin   10 mg Oral QAC breakfast   escitalopram   20 mg Oral Daily   fentaNYL  (SUBLIMAZE ) injection  50-100 mcg Intravenous UD   hydrALAZINE   50 mg Oral TID   insulin  aspart  0-20 Units Subcutaneous TID WC   insulin  glargine  16 Units Subcutaneous QHS   polyethylene glycol  17 g Oral Daily   tamsulosin   0.4 mg Oral Daily   Continuous Infusions:   LOS: 1 day    Time spent: 35 Minutes    Lis Savitt A Ziare Orrick, MD [facility]   If 7PM-7AM, please contact night-coverage [email or web address]  11/17/2023, 8:18 AM

## 2023-11-17 NOTE — Telephone Encounter (Signed)
 Copied from CRM [id number]. Topic: General - Other >> Nov 16, 2023  2:08 PM Rea ORN wrote: Reason for CRM: Pt son [name] called to advise that pt fell this morning and is having surgery tomorrow. He is currently at [facility]

## 2023-11-17 NOTE — Progress Notes (Signed)
 Initial Nutrition Assessment  INTERVENTION:   -Ensure MAX Protein po BID, each supplement provides 150 kcal and 30 grams of protein   -Multivitamin with minerals daily  NUTRITION DIAGNOSIS:   Increased nutrient needs related to hip fracture, post-op healing as evidenced by estimated needs.  GOAL:   Patient will meet greater than or equal to 90% of their needs  MONITOR:   PO intake, Supplement acceptance  REASON FOR ASSESSMENT:   Consult Hip fracture protocol  ASSESSMENT:   89 y.o. male with medical history significant of anxiety, hyperlipidemia, CAD, colon polyps, colon diverticulosis, type 2 diabetes, gastritis, nephrolithiasis, history of pyelonephritis, history of syncopal episode, hypertension, BPH, melanoma, myoclonus, biliary pancreatitis who had a  fall while standing at home hitting his left hip, left flank and left mid back.  Patient in OR for left hip fracture. NPO for procedure today. Noted pt also with severe constipation, currently on bowel regimen. Will order Ensure Max supplements following surgery to aid in post-op healing.   Per weight records, pt's weight has fluctuated between 212-228 lbs over the past year.  Medications: Vitamin B-12, Miralax , Senokot, Lactated ringers , Zofran   Labs reviewed: CBGs: 211-232   NUTRITION - FOCUSED PHYSICAL EXAM:  Unable to complete  Diet Order:   Diet Order             Diet NPO time specified Except for: Sips with Meds  Diet effective midnight                   EDUCATION NEEDS:   Not appropriate for education at this time  Skin:  Skin Assessment: Reviewed RN Assessment  Last BM:  10/6  Height:   Ht Readings from Last 1 Encounters:  11/17/23 5' 11 (1.803 m)    Weight:   Wt Readings from Last 1 Encounters:  11/17/23 100.7 kg    BMI:  Body mass index is 30.96 kg/m.  Estimated Nutritional Needs:   Kcal:  1800-2000  Protein:  90-100g  Fluid:  2L/day   Morna Lee, MS, RD,  LDN Inpatient Clinical Dietitian Contact via Secure chat

## 2023-11-17 NOTE — TOC Initial Note (Signed)
 Transition of Care [facility]) - Initial/Assessment Note   Patient Details  Name: [name] MRN: [id number] Date of Birth: 1935-02-03  Transition [facility]) CM/SW Contact:    Duwaine GORMAN Aran, LCSW Phone Number: 11/17/2023, 10:45 AM  Clinical Narrative: Patient is from home with family. Patient scheduled for surgery today. Care management awaiting PT recommendations.  Expected Discharge Plan:  (TBD) Barriers to Discharge: Continued Medical Work up  Expected Discharge Plan and Services In-house Referral: Clinical Social Work Living arrangements for the past 2 months: Single Family Home  Prior Living Arrangements/Services Living arrangements for the past 2 months: Single Family Home Lives with:: Adult Children Patient language and need for interpreter reviewed:: Yes Do you feel safe going back to the place where you live?: Yes      Need for Family Participation in Patient Care: No (Comment) Care giver support system in place?: Yes (comment) Current home services: DME (RW, cane, BSC, shower seat) Criminal Activity/Legal Involvement Pertinent to Current Situation/Hospitalization: No - Comment as needed  Activities of Daily Living ADL Screening (condition at time of admission) Independently performs ADLs?: Yes (appropriate for developmental age) Is the patient deaf or have difficulty hearing?: No Does the patient have difficulty seeing, even when wearing glasses/contacts?: No Does the patient have difficulty concentrating, remembering, or making decisions?: No  Emotional Assessment Orientation: : Oriented to Place, Oriented to Self, Oriented to Situation Alcohol / Substance Use: Not Applicable Psych Involvement: No (comment)  Admission diagnosis:  Closed left hip fracture (HCC) [S72.002A] Fall, initial encounter [W19.XXXA] Patient Active Problem List   Diagnosis Date Noted   Closed left hip fracture (HCC) 11/16/2023   Pseudohyponatremia 11/16/2023   History of pneumonia  09/30/2023   CHF (congestive heart failure) (HCC) 06/10/2023   Acute on chronic combined systolic and diastolic heart failure (HCC) 06/08/2023   Obesity, class 1 04/11/2023   Acute respiratory failure with hypoxia (HCC) 04/09/2023   Acute on chronic systolic CHF (congestive heart failure) (HCC) 04/09/2023   Psychogenic nonepileptic seizure 02/01/2023   Obesity (BMI 30-39.9) 01/23/2023   Memory change 07/31/2022   Drug-induced myopathy 07/28/2021   Tachycardia 01/09/2021   DNR (do not resuscitate) 04/13/2020   Hematoma 04/05/2020   Epigastric abdominal pain 04/01/2020   History of melanoma 2022   Medicare annual wellness visit, subsequent 01/03/2019   Recurrent Epistaxis 03/02/2017   Vitamin B 12 deficiency 08/17/2015   Elevated TSH 10/06/2014   AKI (acute kidney injury) 10/05/2014   Unsteady gait 09/18/2014   Midline low back pain without sciatica 09/18/2014   Tremor 05/04/2014   Chronic combined systolic and diastolic congestive heart failure (HCC) 01/27/2014   Ataxia 01/10/2014   UTI (urinary tract infection) 01/02/2014   History of coronary artery disease    Hypokalemia    Chest pain 12/15/2013   Healthcare maintenance 11/10/2013   Advance care planning 11/10/2013   ED (erectile dysfunction) 06/24/2013   HLD (hyperlipidemia) 03/22/2012   Type 2 diabetes mellitus with vascular disease (HCC) 03/22/2012   Thyroid  cyst 03/19/2011   BPH with obstruction/lower urinary tract symptoms 03/15/2010   GASTRITIS 02/21/2007   Anxiety 02/15/2007   CAD (coronary artery disease) 02/15/2007   DIVERTICULOSIS OF COLON 02/15/2007   COLONIC POLYPS 02/12/2007   Hypertension associated with diabetes (HCC) 02/12/2007   RENAL CALCULUS 02/12/2007   Osteoarthritis 02/12/2007   PCP:  Cleatus Arlyss GORMAN, MD Pharmacy:   [facility] - [place], [place] - [address] [place] [address]  Phone: [phone] Fax: [phone]  [facility]  [facility]) - [place], [place] - [address] [place] [address] [place] [address] Phone: [phone] Fax: [phone]  [facility] - [place], [place] - [address] [place] [address] Phone: [phone] Fax: [phone]  Social Drivers of Health (SDOH) Social History: SDOH Screenings   Food Insecurity: No Food Insecurity (11/16/2023)  Housing: Low Risk  (11/16/2023)  Transportation Needs: No Transportation Needs (11/16/2023)  Utilities: Not At Risk (11/16/2023)  Alcohol Screen: Low Risk  (07/23/2023)  Depression (PHQ2-9): Medium Risk (10/23/2023)  Financial Resource Strain: Low Risk  (07/23/2023)  Physical Activity: Inactive (07/23/2023)  Social Connections: Moderately Isolated (11/16/2023)  Stress: No Stress Concern Present (07/23/2023)  Tobacco Use: Low Risk  (11/17/2023)  Health Literacy: Inadequate Health Literacy (07/23/2023)   SDOH Interventions:    Readmission Risk Interventions    04/17/2023   10:53 AM 04/10/2023    1:41 PM  Readmission Risk Prevention Plan  Transportation Screening Complete Complete  PCP or Specialist Appt within 5-7 Days Complete Complete  Home Care Screening Complete Complete  Medication Review (RN CM) Complete Complete

## 2023-11-17 NOTE — Op Note (Signed)
.  Orthopaedic Surgery Operative Note (CSN: [id number])  [name]  06/07/1934 Date of Surgery: 11/17/2023   Diagnoses:  LEFT HIP INTERCHANTERIC FRACTURE  Procedure: Left hip cephalomedullary nailing   Operative Finding  Post-operative plan: The patient will be weight bearing as tolerated left leg.  The patient will be evaluated by physical therapy.  DVT prophylaxis Aspirin  81 mg twice daily for 6 weeks.   Pain control with PRN pain medication preferring oral medicines.  Follow up plan will be scheduled in approximately 10-14 days for incision check and XR.  Post-Op Diagnosis: Same Surgeons:Primary: Sherida Adine BROCKS, MD Assistants: None Location: [facility] ROOM 07 Anesthesia: General Antibiotics: Ancef  2 g Tourniquet time:  N/A Estimated Blood Loss: 150cc Complications: None Specimens: None Implants: Implant Name Type Inv. Item Serial No. Manufacturer Lot No. LRB No. Used Action  NAIL LESTER GAILS [id number] - [id number] Nail NAIL LESTER GAILS [employer] [id number] Left 1 Implanted  SCREW LAG COMPR KIT 105/100 - [id number] Screw SCREW LAG COMPR KIT 105/100  [facility] [employer] [id number] Left 1 Implanted  SCREW TRIGEN LOW PROF 5.0X37.5 - [id number] Screw SCREW TRIGEN LOW PROF 5.0X37.5  [employer] [id number] Left 1 Implanted    Indications for Surgery:   [name] is a 89 y.o. male with left intertrochanteric hip fracture. He was indicated for a left hip cephalomedullary nailing   Risks and benefits of surgery discussed with the patient.  These risk include but are not limited to infection, bleeding, damage to surrounding structures, stiffness, DVT, prolonged rehabilitation, fracture, malunion, nonunion, prominent hardware, failure of hardware, need for additional surgery and cardiopulmonary complications.  The patient understands this and wished proceed with surgery.    Procedure:   The patient was identified properly.  Informed consent was obtained and the surgical site was marked. The patient was taken up to suite where general anesthesia was induced.  The patient was placed supine on a fracture table and appropriate reduction was obtained and visualized on fluoroscopy prior to the beginning of the procedure.  The left leg was prepped and draped in the usual sterile fashion. We made an incision proximal to the greater trochanter and dissected down through the fascia.  We then carefully placed our starting wire localizing under fluoroscopy prior to advancing the wire into the bone and an entry reamer used to open the canal We selected a length of nail noted above.  At this point we placed our nail localizing under fluoroscopy that it was at the appropriate level prior to using the outrigger device to pass a wire and then the cephalo-medullary screws.  The screws were locked proximally to avoid over collapse.  We took final shots at the proximal femur and then used the outrigger to place one distal interlock screw.  Final pictures were obtained.  The wounds were thoroughly irrigated closed in a multilayer fashion with absorable sutures and skin staples.  A sterile dressing was placed.  The patient was awoken from general anesthesia and taken to the PACU in stable condition without complication.

## 2023-11-17 NOTE — Telephone Encounter (Signed)
 Noted, thanks.  I am awaiting the inpatient notes.

## 2023-11-17 NOTE — Evaluation (Signed)
 Physical Therapy Evaluation Patient Details Name: [name] MRN: [id number] DOB: [birth year] Today's Date: 11/17/2023  History of Present Illness  Pt is an 89 y.o. male whom presents to therapy following mechanical fall in home setting in which pt struck L hip, flank and mid back as well as forehead. Fall resulted in L intertrochanteric femur fx and L 9 and 10th rib fx. Pt underwent L hip cephalomedulary nailing on 11/17/2023 and is currently L LE WBAT. Pt PMH includes but is not limited to: HFmrEF, insulin -dependent DM2, HTN, hyperlipidemia, BPH, anxiety, and CAD.  Clinical Impression     [name] is a 89 y.o. male POD 0 s/p L femur ORIF. Patient reports mod I with mobility at baseline. Patient is now limited by functional impairments (see PT problem list below) and requires max A x 2 to roll side to side  for bed mobility and unable to safely participate with transfers or gait tasks at time of eval.. Patient will benefit from continued skilled PT interventions to address impairments and progress towards PLOF. Acute PT will follow to progress mobility and stair training in preparation for safe discharge to the next venue.  Patient will benefit from continued inpatient follow up therapy, <3 hours/day..       If plan is discharge home, recommend the following: Two people to help with walking and/or transfers;Two people to help with bathing/dressing/bathroom;Assistance with cooking/housework;Assist for transportation;Help with stairs or ramp for entrance   Can travel by private vehicle   No    Equipment Recommendations None recommended by PT  Recommendations for Other Services       Functional Status Assessment Patient has had a recent decline in their functional status and demonstrates the ability to make significant improvements in function in a reasonable and predictable amount of time.     Precautions / Restrictions Precautions Precautions: Fall Restrictions Weight Bearing  Restrictions Per Provider Order: Yes LLE Weight Bearing Per Provider Order: Weight bearing as tolerated      Mobility  Bed Mobility Overal bed mobility: Needs Assistance Bed Mobility: Rolling Rolling: Max assist, +2 for physical assistance, +2 for safety/equipment, Used rails         General bed mobility comments: max A x 2 to roll side to side with limited rolling to the L due to acute surgery, pt nauseated, expressing pain and constipated. pt left in bed with nursing staff having administered enema during rolling tasks    Transfers                   General transfer comment: NT    Ambulation/Gait               General Gait Details: NT  Stairs            Wheelchair Mobility     Tilt Bed    Modified Rankin (Stroke Patients Only)       Balance Overall balance assessment: History of Falls                                           Pertinent Vitals/Pain Pain Assessment Pain Assessment: Faces Faces Pain Scale: Hurts worst Pain Location: L hip and LE Pain Descriptors / Indicators: Aching, Constant, Discomfort, Dull, Grimacing, Operative site guarding, Moaning, Sore Pain Intervention(s): Limited activity within patient's tolerance, Monitored during session, Premedicated before session, Repositioned  Home Living Family/patient expects to be discharged to:: Private residence Living Arrangements: Children Available Help at Discharge: Family;Available 24 hours/day Type of Home: House Home Access: Stairs to enter Entrance Stairs-Rails: Right Entrance Stairs-Number of Steps: 3   Home Layout: One level Home Equipment: Agricultural consultant (2 wheels);Cane - single point;BSC/3in1;Shower seat;Grab bars - toilet;Grab bars - tub/shower;Hand held shower head;Adaptive equipment;Lift chair      Prior Function Prior Level of Function : Independent/Modified Independent;Driving             Mobility Comments: used RW mod I ADLs Comments:  some assist with IADLs     Extremity/Trunk Assessment        Lower Extremity Assessment Lower Extremity Assessment: LLE deficits/detail LLE Deficits / Details: PT unable to assess due to pain and acute fall, fx and subsequent surgery, pt is unable to demonstrate active mm movement at time of eval LLE Sensation: WNL    Cervical / Trunk Assessment Cervical / Trunk Assessment: Normal  Communication   Communication Communication: Impaired Factors Affecting Communication: Hearing impaired    Cognition Arousal: Alert Behavior During Therapy: Flat affect   PT - Cognitive impairments: No apparent impairments                       PT - Cognition Comments: pt groggy and slight confusion at time of eval Following commands: Intact       Cueing       General Comments      Exercises     Assessment/Plan    PT Assessment Patient needs continued PT services  PT Problem List Decreased strength;Decreased range of motion;Decreased activity tolerance;Decreased balance;Decreased mobility;Decreased coordination;Pain       PT Treatment Interventions DME instruction;Gait training;Stair training;Functional mobility training;Therapeutic exercise;Therapeutic activities;Balance training;Neuromuscular re-education;Patient/family education;Modalities    PT Goals (Current goals can be found in the Care Plan section)  Acute Rehab PT Goals Patient Stated Goal: to be able to get back home PT Goal Formulation: With patient Time For Goal Achievement: 12/01/23 Potential to Achieve Goals: Fair    Frequency Min 3X/week     Co-evaluation               AM-PAC PT 6 Clicks Mobility  Outcome Measure Help needed turning from your back to your side while in a flat bed without using bedrails?: Total Help needed moving from lying on your back to sitting on the side of a flat bed without using bedrails?: Total Help needed moving to and from a bed to a chair (including a  wheelchair)?: Total Help needed standing up from a chair using your arms (e.g., wheelchair or bedside chair)?: Total Help needed to walk in hospital room?: Total Help needed climbing 3-5 steps with a railing? : Total 6 Click Score: 6    End of Session   Activity Tolerance: No increased pain;Patient limited by fatigue (nausea) Patient left: in bed;with call bell/phone within reach;with SCD's reapplied Nurse Communication: Mobility status;Need for lift equipment;Patient requests pain meds;Other (comment) (nausea) PT Visit Diagnosis: Unsteadiness on feet (R26.81);Other abnormalities of gait and mobility (R26.89);Muscle weakness (generalized) (M62.81);History of falling (Z91.81);Difficulty in walking, not elsewhere classified (R26.2);Pain Pain - Right/Left: Left Pain - part of body: Knee;Leg;Hip    Time: 8391-8366 PT Time Calculation (min) (ACUTE ONLY): 25 min   Charges:   PT Evaluation $PT Eval Low Complexity: 1 Low PT Treatments $Therapeutic Activity: 8-22 mins PT General Charges $$ ACUTE PT VISIT: 1 Visit  [place], PT Acute Rehab   [name] 11/17/2023, 5:50 PM

## 2023-11-17 NOTE — Telephone Encounter (Signed)
 Noted.  I deferred this refill in the meantime. Will await inpatient reports.

## 2023-11-17 NOTE — Anesthesia Procedure Notes (Signed)
 Procedure Name: Intubation Date/Time: 11/17/2023 12:04 PM  Performed by: Therisa Doyal CROME, CRNAPre-anesthesia Checklist: Patient identified, Emergency Drugs available, Suction available and Patient being monitored Patient Re-evaluated:Patient Re-evaluated prior to induction Oxygen Delivery Method: Circle system utilized Preoxygenation: Pre-oxygenation with 100% oxygen Induction Type: IV induction, Rapid sequence and Cricoid Pressure applied Tube type: Oral Tube size: 8.0 mm Number of attempts: 1 Airway Equipment and Method: Stylet and Oral airway Placement Confirmation: ETT inserted through vocal cords under direct vision, positive ETCO2 and breath sounds checked- equal and bilateral Secured at: 22 cm Tube secured with: Tape Dental Injury: Teeth and Oropharynx as per pre-operative assessment

## 2023-11-17 NOTE — Transfer of Care (Signed)
 Immediate Anesthesia Transfer of Care Note  [name]  Procedure(s) Performed: FIXATION, FRACTURE, INTERTROCHANTERIC, WITH INTRAMEDULLARY ROD (Left: Hip)  Patient Location: PACU  Anesthesia Type:General  Level of Consciousness: drowsy  Airway & Oxygen Therapy: Patient Spontanous Breathing and Patient connected to face mask oxygen  Post-op Assessment: Report given to RN and Post -op Vital signs reviewed and stable  Post vital signs: Reviewed and stable  Last Vitals:  Vitals Value Taken Time  BP 148/72 11/17/23 13:40  Temp    Pulse 79 11/17/23 13:41  Resp 9 11/17/23 13:41  SpO2 99 % 11/17/23 13:41  Vitals shown include unfiled device data.  Last Pain:  Vitals:   11/17/23 1014  TempSrc: Oral  PainSc: 8          Complications: No notable events documented.

## 2023-11-17 NOTE — Consult Note (Signed)
 89 year old male with a left IT/basicervical hip fracture after mechanical fall. Patient seen by Dr. Dalldor. Due to scheduling I am assuming care for surgery for this patient  Plan for left hip cephalomedullary nail  Risks and benefits of surgery discussed with the patient.  These risk include but are not limited to infection, bleeding, damage to surrounding structures, stiffness, DVT, prolonged rehabilitation, fracture, malunion, nonunion, prominent hardware, failure of hardware, need for additional surgery and cardiopulmonary complications.  The patient understands this and wished proceed with surgery.

## 2023-11-18 ENCOUNTER — Encounter (HOSPITAL_COMMUNITY): Payer: Self-pay | Admitting: Orthopedic Surgery

## 2023-11-18 DIAGNOSIS — S72002A Fracture of unspecified part of neck of left femur, initial encounter for closed fracture: Secondary | ICD-10-CM | POA: Diagnosis not present

## 2023-11-18 LAB — BASIC METABOLIC PANEL WITH GFR
Anion gap: 11 (ref 5–15)
BUN: 39 mg/dL — ABNORMAL HIGH (ref 8–23)
CO2: 23 mmol/L (ref 22–32)
Calcium: 9.3 mg/dL (ref 8.9–10.3)
Chloride: 104 mmol/L (ref 98–111)
Creatinine, Ser: 1.61 mg/dL — ABNORMAL HIGH (ref 0.61–1.24)
GFR, Estimated: 41 mL/min — ABNORMAL LOW (ref >60)
Glucose, Bld: 214 mg/dL — ABNORMAL HIGH (ref 70–99)
Potassium: 3.7 mmol/L (ref 3.5–5.1)
Sodium: 138 mmol/L (ref 135–145)

## 2023-11-18 LAB — GLUCOSE, CAPILLARY
Glucose-Capillary: 218 mg/dL — ABNORMAL HIGH (ref 70–99)
Glucose-Capillary: 162 mg/dL — ABNORMAL HIGH (ref 70–99)
Glucose-Capillary: 252 mg/dL — ABNORMAL HIGH (ref 70–99)
Glucose-Capillary: 136 mg/dL — ABNORMAL HIGH (ref 70–99)

## 2023-11-18 LAB — CBC
HCT: 30.7 % — ABNORMAL LOW (ref 39.0–52.0)
Hemoglobin: 9.8 g/dL — ABNORMAL LOW (ref 13.0–17.0)
MCH: 31 pg (ref 26.0–34.0)
MCHC: 31.9 g/dL (ref 30.0–36.0)
MCV: 97.2 fL (ref 80.0–100.0)
Platelets: 221 K/uL (ref 150–400)
RBC: 3.16 MIL/uL — ABNORMAL LOW (ref 4.22–5.81)
RDW: 13.7 % (ref 11.5–15.5)
WBC: 12.4 K/uL — ABNORMAL HIGH (ref 4.0–10.5)
nRBC: 0 % (ref 0.0–0.2)

## 2023-11-18 MED ORDER — SMOG ENEMA
960.0000 mL | Freq: Once | RECTAL | Status: AC
  Administered 2023-11-18: 960 mL via RECTAL
  Filled 2023-11-18: qty 960

## 2023-11-18 MED ORDER — INSULIN GLARGINE 100 UNIT/ML ~~LOC~~ SOLN
8.0000 [IU] | Freq: Every day | SUBCUTANEOUS | Status: DC
Start: 2023-11-18 — End: 2023-11-20
  Administered 2023-11-18 – 2023-11-19 (×2): 8 [IU] via SUBCUTANEOUS
  Filled 2023-11-18 (×3): qty 0.08

## 2023-11-18 NOTE — Plan of Care (Signed)
  Problem: Education: Goal: Ability to describe self-care measures that may prevent or decrease complications (Diabetes Survival Skills Education) will improve Outcome: Progressing   Problem: Coping: Goal: Ability to adjust to condition or change in health will improve Outcome: Progressing   Problem: Fluid Volume: Goal: Ability to maintain a balanced intake and output will improve Outcome: Progressing   Problem: Pain Managment: Goal: General experience of comfort will improve and/or be controlled Outcome: Progressing   Problem: Safety: Goal: Ability to remain free from injury will improve Outcome: Progressing   Problem: Skin Integrity: Goal: Risk for impaired skin integrity will decrease Outcome: Progressing

## 2023-11-18 NOTE — Anesthesia Postprocedure Evaluation (Signed)
 Anesthesia Post Note  [name]  Procedure(s) Performed: FIXATION, FRACTURE, INTERTROCHANTERIC, WITH INTRAMEDULLARY ROD (Left: Hip)     Patient location during evaluation: PACU Anesthesia Type: General Level of consciousness: awake and alert Pain management: pain level controlled Vital Signs Assessment: post-procedure vital signs reviewed and stable Respiratory status: spontaneous breathing, nonlabored ventilation, respiratory function stable and patient connected to nasal cannula oxygen Cardiovascular status: blood pressure returned to baseline and stable Postop Assessment: no apparent nausea or vomiting Anesthetic complications: no   No notable events documented.                  [place] MARLA Cornea

## 2023-11-18 NOTE — Progress Notes (Addendum)
 Physical Therapy Treatment Patient Details Name: [name] MRN: [id number] DOB: 24-Nov-1934 Today's Date: 11/18/2023   History of Present Illness Pt is an 89 y.o. male whom presents to therapy following mechanical fall in home setting in which pt struck L hip, flank and mid back as well as forehead. Fall resulted in L intertrochanteric femur fx and L 9 and 10th rib fx. Pt underwent L hip cephalomedulary nailing on 11/17/2023 and is currently L LE WBAT. Pt PMH includes but is not limited to: HFmrEF, insulin -dependent DM2, HTN, hyperlipidemia, BPH, anxiety, and CAD.    PT Comments   Pt admitted with above diagnosis.  Pt currently with functional limitations due to the deficits listed below (see PT Problem List). Pt in bed when PT arrived. Pt appears to be more alert and communicative. Pt required extensive assist for supine <> sit-- total A x 2, min A for sitting balance EOB with strong R lateral lean to offload L LE in sitting, pt unable to progress with trial of sit to stand  due to pt becoming nauseated seated EOB, pt assisted to supine and repositioned, all needs in place, CP to L LE and nursing aware of pt pain, O2 findings and nausea. Pt continues to be constipated.  Patient will benefit from continued inpatient follow up therapy, <3 hours/day. Pt will benefit from acute skilled PT to increase their independence and safety with mobility to allow discharge.   Pt 98% on 2 L/min, supplemental O2 doffed and pt 96% at rest on RA, end of tx session pt 94% (96 PR) semi recline in bed on RA.     If plan is discharge home, recommend the following: Two people to help with walking and/or transfers;Two people to help with bathing/dressing/bathroom;Assistance with cooking/housework;Assist for transportation;Help with stairs or ramp for entrance   Can travel by private vehicle     No  Equipment Recommendations  None recommended by PT    Recommendations for Other Services       Precautions /  Restrictions Precautions Precautions: Fall Restrictions Weight Bearing Restrictions Per Provider Order: Yes LLE Weight Bearing Per Provider Order: Weight bearing as tolerated     Mobility  Bed Mobility Overal bed mobility: Needs Assistance Bed Mobility: Rolling, Sit to Supine, Supine to Sit Rolling: Max assist, +2 for physical assistance, +2 for safety/equipment, Used rails   Supine to sit: Total assist, +2 for physical assistance, +2 for safety/equipment, HOB elevated Sit to supine: Total assist, +2 for physical assistance, +2 for safety/equipment   General bed mobility comments: extensive assist for bed mobiltiy, pt demonstrating limited initiation of movement, pt limited due to pain, pt seated EOB with min A and strong R lateral lean to offload L LE in sitting pt reported feeling nauseated and began to vomit seated EOB and assisted to return to supine with nurse aware    Transfers                   General transfer comment: NT    Ambulation/Gait               General Gait Details: NT   Stairs             Wheelchair Mobility     Tilt Bed    Modified Rankin (Stroke Patients Only)       Balance Overall balance assessment: History of Falls  Communication Communication Communication: Impaired Factors Affecting Communication: Hearing impaired  Cognition Arousal: Alert Behavior During Therapy: Flat affect   PT - Cognitive impairments: No apparent impairments                         Following commands: Intact      Cueing    Exercises      General Comments        Pertinent Vitals/Pain Pain Assessment Pain Assessment: Faces Faces Pain Scale: Hurts worst Breathing: normal Negative Vocalization: none Facial Expression: smiling or inexpressive Body Language: relaxed Consolability: no need to console PAINAD Score: 0 Pain Location: L hip and LE Pain Descriptors /  Indicators: Aching, Constant, Discomfort, Dull, Grimacing, Operative site guarding, Moaning, Sore Pain Intervention(s): Limited activity within patient's tolerance, Monitored during session, Premedicated before session, Ice applied, Repositioned, Patient requesting pain meds-RN notified    Home Living                          Prior Function            PT Goals (current goals can now be found in the care plan section) Acute Rehab PT Goals Patient Stated Goal: to be able to get back home PT Goal Formulation: With patient Time For Goal Achievement: 12/01/23 Potential to Achieve Goals: Fair Progress towards PT goals: Progressing toward goals (limited)    Frequency    Min 3X/week      PT Plan      Co-evaluation              AM-PAC PT 6 Clicks Mobility   Outcome Measure  Help needed turning from your back to your side while in a flat bed without using bedrails?: Total Help needed moving from lying on your back to sitting on the side of a flat bed without using bedrails?: Total Help needed moving to and from a bed to a chair (including a wheelchair)?: Total Help needed standing up from a chair using your arms (e.g., wheelchair or bedside chair)?: Total Help needed to walk in hospital room?: Total Help needed climbing 3-5 steps with a railing? : Total 6 Click Score: 6    End of Session   Activity Tolerance: Patient limited by fatigue;Patient limited by pain (nausea) Patient left: in bed;with call bell/phone within reach;with SCD's reapplied Nurse Communication: Mobility status;Need for lift equipment;Patient requests pain meds;Other (comment) (nausea) PT Visit Diagnosis: Unsteadiness on feet (R26.81);Other abnormalities of gait and mobility (R26.89);Muscle weakness (generalized) (M62.81);History of falling (Z91.81);Difficulty in walking, not elsewhere classified (R26.2);Pain Pain - Right/Left: Left Pain - part of body: Knee;Leg;Hip     Time: 8897-8874 PT  Time Calculation (min) (ACUTE ONLY): 23 min  Charges:    $Therapeutic Activity: 23-37 mins PT General Charges $$ ACUTE PT VISIT: 1 Visit                     [place], PT Acute Rehab    [name] 11/18/2023, 2:07 PM

## 2023-11-18 NOTE — Progress Notes (Signed)
 PROGRESS NOTE    [name]  FMW:[id number] DOB: 1934-12-27 DOA: 11/16/2023 PCP: [name], MD    Brief Narrative:  89 year old with history of anxiety, coronary artery disease, hyperlipidemia, type 2 diabetes on insulin , nephrolithiasis, BPH fell at home on his left hip.  Found to have intercurrently fracture left side.  Skeletal survey negative for injury, however CT scan abdomen pelvis positive for 9.2 cm exophytic right upper lobe renal mass and also significant constipation. Underwent IM nailing 10/7.  Subjective: Patient seen and examined.  Poor historian.  Excruciating pain on attempted mobility.  No bowel movement even after Fleet enema yesterday.  Patient denies any nausea vomiting.  Assessment & Plan:   Closed traumatic intertrochanteric fracture of the left ORIF: ORIF IM nailing 10/7-Dr. Sherida Weightbearing as tolerated Adequate pain medications oral and IV opiates along with laxative regimen. Aspirin  81 mg twice daily for 6 weeks as per orthopedics.  Outpatient follow-up with orthopedics.  Work with PT OT and referred to a SNF for rehab.  Right renal mass: Incidental finding of right renal mass on CT scan.  Will send referral to urology on discharge.  Severe constipation: MiraLAX  twice daily, Senokot twice daily.  Soapsuds enema today.  Hypertension: Blood pressure stable on Norvasc  and Coreg .  Type 2 diabetes with vascular disease: On Lantus  and sliding scale.  Holding home dose of Jardiance .  BPH: On Flomax .  AKI: Improved.  Encourage oral intake.  Discontinue IV fluids.    DVT prophylaxis: SCDs Start: 11/16/23 1147   Code Status: Full code Family Communication: None at the bedside Disposition Plan: Status is: Inpatient Remains inpatient appropriate because: Immediate postop     Consultants:  Orthopedics  Procedures:  Left hip ORIF  Antimicrobials:  Perioperative     Objective: Vitals:   11/17/23 1927 11/18/23 0523 11/18/23 0924  11/18/23 1249  BP: 138/67 (!) 143/85 (!) 150/68 (!) 110/48  Pulse: 76 87  70  Resp: 16 16  19   Temp: 98.5 F (36.9 C) 98.6 F (37 C)  98 F (36.7 C)  TempSrc: Oral Oral  Oral  SpO2: 96% 99%  100%  Weight:      Height:        Intake/Output Summary (Last 24 hours) at 11/18/2023 1340 Last data filed at 11/18/2023 0308 Gross per 24 hour  Intake 742.5 ml  Output 800 ml  Net -57.5 ml   Filed Weights   11/17/23 0949 11/17/23 1014  Weight: 100.7 kg 100.7 kg    Examination:  General exam: Appears calm but slightly anxious.  Frail. Respiratory system: Clear to auscultation. Respiratory effort normal.  Some conducted upper airway sounds. Cardiovascular system: S1 & S2 heard, RRR.  Gastrointestinal system: Abdomen is nondistended, soft and nontender. No organomegaly or masses felt. Normal bowel sounds heard. Central nervous system: Alert and oriented. No focal neurological deficits. Extremities: Symmetric 5 x 5 power. Skin: Left lateral thigh incisions clean and dry.  Mild swelling present.    Data Reviewed: I have personally reviewed following labs and imaging studies  CBC: Recent Labs  Lab 11/16/23 0820 11/17/23 0546 11/18/23 0602  WBC 14.2* 12.2* 12.4*  NEUTROABS 10.8*  --   --   HGB 14.6 11.4* 9.8*  HCT 44.3 35.9* 30.7*  MCV 92.1 94.2 97.2  PLT 307 250 221   Basic Metabolic Panel: Recent Labs  Lab 11/16/23 0820 11/17/23 0546 11/18/23 0602  NA 134* 137 138  K 3.4* 3.6 3.7  CL 101 102 104  CO2  17* 23 23  GLUCOSE 380* 223* 214*  BUN 42* 43* 39*  CREATININE 1.53* 1.62* 1.61*  CALCIUM  9.8 9.4 9.3   GFR: Estimated Creatinine Clearance: 39.8 mL/min (A) (by C-G formula based on SCr of 1.61 mg/dL (H)). Liver Function Tests: Recent Labs  Lab 11/17/23 0546  AST 16  ALT 14  ALKPHOS 58  BILITOT 0.5  PROT 5.6*  ALBUMIN 3.6   No results for input(s): LIPASE, AMYLASE in the last 168 hours. No results for input(s): AMMONIA in the last 168  hours. Coagulation Profile: No results for input(s): INR, PROTIME in the last 168 hours. Cardiac Enzymes: No results for input(s): CKTOTAL, CKMB, CKMBINDEX, TROPONINI in the last 168 hours. BNP (last 3 results) No results for input(s): PROBNP in the last 8760 hours. HbA1C: No results for input(s): HGBA1C in the last 72 hours. CBG: Recent Labs  Lab 11/17/23 1406 11/17/23 1719 11/17/23 2039 11/18/23 0739 11/18/23 1225  GLUCAP 223* 188* 152* 218* 162*   Lipid Profile: No results for input(s): CHOL, HDL, LDLCALC, TRIG, CHOLHDL, LDLDIRECT in the last 72 hours. Thyroid  Function Tests: No results for input(s): TSH, T4TOTAL, FREET4, T3FREE, THYROIDAB in the last 72 hours. Anemia Panel: No results for input(s): VITAMINB12, FOLATE, FERRITIN, TIBC, IRON, RETICCTPCT in the last 72 hours. Sepsis Labs: No results for input(s): PROCALCITON, LATICACIDVEN in the last 168 hours.  No results found for this or any previous visit (from the past 240 hours).       Radiology Studies: DG HIP UNILAT WITH PELVIS 2-3 VIEWS LEFT Result Date: 11/17/2023 CLINICAL DATA:  Elective surgery. EXAM: DG HIP (WITH OR WITHOUT PELVIS) 2-3V LEFT COMPARISON:  Radiograph 11/16/2023 FINDINGS: Five fluoroscopic spot views of the left hip submitted from the operating room. Imaging obtained during femoral intramedullary nail with trans trochanteric and distal locking screw fixation of proximal femur fracture. Fluoroscopy time 49 seconds. Dose 19.618 mGy. IMPRESSION: Intraoperative fluoroscopy during left proximal femur fracture ORIF. Electronically Signed   By: Andrea Gasman M.D.   On: 11/17/2023 15:10   DG C-Arm 1-60 Min-No Report Result Date: 11/17/2023 Fluoroscopy was utilized by the requesting physician.  No radiographic interpretation.        Scheduled Meds:  amLODipine   5 mg Oral Daily   aspirin  EC  81 mg Oral BID   carvedilol   12.5 mg Oral BID WC    cyanocobalamin   1,000 mcg Oral Daily   escitalopram   20 mg Oral Daily   hydrALAZINE   50 mg Oral TID   insulin  aspart  0-20 Units Subcutaneous TID WC   insulin  glargine  8 Units Subcutaneous QHS   multivitamin with minerals  1 tablet Oral Daily   polyethylene glycol  17 g Oral BID   Ensure Max Protein  11 oz Oral BID   senna-docusate  1 tablet Oral BID   SMOG  960 mL Rectal Once   tamsulosin   0.4 mg Oral Daily   Continuous Infusions:   ceFAZolin  (ANCEF ) IV 1 g (11/18/23 0849)     LOS: 2 days    Time spent: 51 minutes    Renato Applebaum, MD [facility]

## 2023-11-18 NOTE — NC FL2 (Signed)
 [place]  MEDICAID [id number] LEVEL OF CARE FORM     IDENTIFICATION  Patient Name: [name] Birthdate: 06-29-1934 Sex: male Admission Date (Current Location): 11/16/2023  [facility] and [place] Number:  Producer, television/film/video and Address:  [facility],  [address], [place] [address]      Provider Number: [id number]  Attending Physician Name and Address:  Raenelle Coria, MD  Relative Name and Phone Number:  [facility]) Ph: [phone]    Current Level of Care: Hospital Recommended Level of Care: Skilled Nursing Facility Prior Approval Number:    Date Approved/Denied:   PASRR Number: [id number]  Discharge Plan: SNF    Current Diagnoses: Patient Active Problem List   Diagnosis Date Noted   Closed left hip fracture (HCC) 11/16/2023   Pseudohyponatremia 11/16/2023   History of pneumonia 09/30/2023   CHF (congestive heart failure) (HCC) 06/10/2023   Acute on chronic combined systolic and diastolic heart failure (HCC) 06/08/2023   Obesity, class 1 04/11/2023   Acute respiratory failure with hypoxia (HCC) 04/09/2023   Acute on chronic systolic CHF (congestive heart failure) (HCC) 04/09/2023   Psychogenic nonepileptic seizure 02/01/2023   Obesity (BMI 30-39.9) 01/23/2023   Memory change 07/31/2022   Drug-induced myopathy 07/28/2021   Tachycardia 01/09/2021   DNR (do not resuscitate) 04/13/2020   Hematoma 04/05/2020   Epigastric abdominal pain 04/01/2020   History of melanoma 2022   Medicare annual wellness visit, subsequent 01/03/2019   Recurrent Epistaxis 03/02/2017   Vitamin B 12 deficiency 08/17/2015   Elevated TSH 10/06/2014   AKI (acute kidney injury) 10/05/2014   Unsteady gait 09/18/2014   Midline low back pain without sciatica 09/18/2014   Tremor 05/04/2014   Chronic combined systolic and diastolic congestive heart failure (HCC) 01/27/2014   Ataxia 01/10/2014   UTI (urinary tract infection) 01/02/2014   History of coronary artery  disease    Hypokalemia    Chest pain 12/15/2013   Healthcare maintenance 11/10/2013   Advance care planning 11/10/2013   ED (erectile dysfunction) 06/24/2013   HLD (hyperlipidemia) 03/22/2012   Type 2 diabetes mellitus with vascular disease (HCC) 03/22/2012   Thyroid  cyst 03/19/2011   BPH with obstruction/lower urinary tract symptoms 03/15/2010   GASTRITIS 02/21/2007   Anxiety 02/15/2007   CAD (coronary artery disease) 02/15/2007   DIVERTICULOSIS OF COLON 02/15/2007   COLONIC POLYPS 02/12/2007   Hypertension associated with diabetes (HCC) 02/12/2007   RENAL CALCULUS 02/12/2007   Osteoarthritis 02/12/2007    Orientation RESPIRATION BLADDER Height & Weight     Self, Time, Situation, Place  Normal Continent Weight: 222 lb (100.7 kg) Height:  5' 11 (180.3 cm)  BEHAVIORAL SYMPTOMS/MOOD NEUROLOGICAL BOWEL NUTRITION STATUS      Continent Diet (Full liquid diet)  AMBULATORY STATUS COMMUNICATION OF NEEDS Skin   Extensive Assist Verbally Surgical wounds                       Personal Care Assistance Level of Assistance  Bathing, Dressing, Feeding Bathing Assistance: Limited assistance Feeding assistance: Independent Dressing Assistance: Limited assistance     Functional Limitations Info  Sight, Hearing, Speech Sight Info: Impaired Hearing Info: Impaired Speech Info: Adequate    SPECIAL CARE FACTORS FREQUENCY  PT (By licensed PT), OT (By licensed OT)     PT Frequency: 5x's/week OT Frequency: 5x's/week            Contractures Contractures Info: Not present    Additional Factors Info  Code Status, Allergies  Code Status Info: Full Allergies Info: Morphine , Atorvastatin , Codeine Phosphate, Metformin  And Related           Current Medications (11/18/2023):  This is the current hospital active medication list Current Facility-Administered Medications  Medication Dose Route Frequency Provider Last Rate Last Admin   acetaminophen  (TYLENOL ) tablet 650 mg  650 mg  Oral Q6H PRN Sherida Adine BROCKS, MD       Or   acetaminophen  (TYLENOL ) suppository 650 mg  650 mg Rectal Q6H PRN Sherida Adine BROCKS, MD       amLODipine  (NORVASC ) tablet 5 mg  5 mg Oral Daily Sherida Adine BROCKS, MD   5 mg at 11/18/23 0924   aspirin  EC tablet 81 mg  81 mg Oral BID Sherida Adine BROCKS, MD   81 mg at 11/18/23 9075   carvedilol  (COREG ) tablet 12.5 mg  12.5 mg Oral BID WC Sherida Adine BROCKS, MD   12.5 mg at 11/18/23 0849   ceFAZolin  (ANCEF ) IVPB 1 g/50 mL premix  1 g Intravenous Q8H Sherida Adine BROCKS, MD 100 mL/hr at 11/18/23 0849 1 g at 11/18/23 0849   cyanocobalamin  (VITAMIN B12) tablet 1,000 mcg  1,000 mcg Oral Daily Wham, Bradley C, MD   1,000 mcg at 11/18/23 9075   escitalopram  (LEXAPRO ) tablet 20 mg  20 mg Oral Daily Sherida Adine BROCKS, MD   20 mg at 11/18/23 9075   hydrALAZINE  (APRESOLINE ) tablet 50 mg  50 mg Oral TID Sherida Adine BROCKS, MD   50 mg at 11/18/23 9075   HYDROmorphone  (DILAUDID ) injection 0.5 mg  0.5 mg Intravenous Q2H PRN Sherida Adine BROCKS, MD   0.5 mg at 11/18/23 1130   insulin  aspart (novoLOG ) injection 0-20 Units  0-20 Units Subcutaneous TID WC Sherida Adine BROCKS, MD   4 Units at 11/18/23 1320   insulin  glargine (LANTUS ) injection 8 Units  8 Units Subcutaneous QHS Ghimire, Kuber, MD       meclizine  (ANTIVERT ) tablet 12.5 mg  12.5 mg Oral TID PRN Wham, Bradley C, MD       multivitamin with minerals tablet 1 tablet  1 tablet Oral Daily Sherida Adine BROCKS, MD   1 tablet at 11/18/23 9075   ondansetron  (ZOFRAN ) tablet 4 mg  4 mg Oral Q6H PRN Sherida Adine BROCKS, MD       Or   ondansetron  (ZOFRAN ) injection 4 mg  4 mg Intravenous Q6H PRN Sherida Adine BROCKS, MD   4 mg at 11/18/23 1128   oxyCODONE  (Oxy IR/ROXICODONE ) immediate release tablet 5 mg  5 mg Oral Q4H PRN Sherida Adine BROCKS, MD   5 mg at 11/18/23 0151   polyethylene glycol (MIRALAX  / GLYCOLAX ) packet 17 g  17 g Oral BID Sherida Adine BROCKS, MD   17 g at 11/18/23 0924   protein supplement (ENSURE MAX) liquid  11 oz Oral BID Sherida Adine BROCKS, MD   11 oz at  11/18/23 0929   senna-docusate (Senokot-S) tablet 1 tablet  1 tablet Oral BID Sherida Adine BROCKS, MD   1 tablet at 11/18/23 9075   tamsulosin  (FLOMAX ) capsule 0.4 mg  0.4 mg Oral Daily Sherida Adine BROCKS, MD   0.4 mg at 11/18/23 9075     Discharge Medications: Please see discharge summary for a list of discharge medications.  Relevant Imaging Results:  Relevant Lab Results:   Additional Information SSN: [id number]  Duwaine GORMAN Aran, LCSW

## 2023-11-18 NOTE — Inpatient Diabetes Management (Signed)
 Inpatient Diabetes Program Recommendations  AACE/ADA: New Consensus Statement on Inpatient Glycemic Control (2015)  Target Ranges:  Prepandial:   less than 140 mg/dL      Peak postprandial:   less than 180 mg/dL (1-2 hours)      Critically ill patients:  140 - 180 mg/dL   Lab Results  Component Value Date   GLUCAP 218 (H) 11/18/2023   HGBA1C 8.8 (A) 09/29/2023    Review of Glycemic Control  Latest Reference Range & Units 11/17/23 07:15 11/17/23 10:22 11/17/23 14:06 11/17/23 17:19 11/17/23 20:39 11/18/23 07:39  Glucose-Capillary 70 - 99 mg/dL 767 (H) 781 (H) 776 (H) 188 (H) 152 (H) 218 (H)  (H): Data is abnormally high  Diabetes history: DM2 Outpatient Diabetes medications: [name]  16 units every day, Jardiance  10 mg QD Current orders for Inpatient glycemic control: Lantus  16 units at bedtime, Novolog  0-20 units TID  Inpatient Diabetes Program Recommendations:    Lantus  has not been administered since admission due to NPO status and Dry heaving.    Please consider administering 50% of home dose-Lantus  8 units every day  Thank you, Wyvonna Pinal, MSN, CDCES Diabetes Coordinator Inpatient Diabetes Program [phone] (team pager from 8a-5p)

## 2023-11-18 NOTE — Progress Notes (Signed)
.  Subjective: 1 Day Post-Op Procedure(s) (LRB): FIXATION, FRACTURE, INTERTROCHANTERIC, WITH INTRAMEDULLARY ROD (Left)  Seen and examined. No events overnight. Minimal pain at rest  Activity level:  WBAT Diet tolerance:  as tolerated Patient reports pain as Minimal at rest but severe with movement    Objective: Vital signs in last 24 hours: Temp:  [97 F (36.1 C)-98.6 F (37 C)] 98.6 F (37 C) (10/08 0523) Pulse Rate:  [76-96] 87 (10/08 0523) Resp:  [14-23] 16 (10/08 0523) BP: (138-168)/(50-85) 143/85 (10/08 0523) SpO2:  [95 %-100 %] 99 % (10/08 0523) Weight:  [100.7 kg] 100.7 kg (10/07 1014)  Labs: Recent Labs    11/16/23 0820 11/17/23 0546 11/18/23 0602  HGB 14.6 11.4* 9.8*   Recent Labs    11/17/23 0546 11/18/23 0602  WBC 12.2* 12.4*  RBC 3.81* 3.16*  HCT 35.9* 30.7*  PLT 250 221   Recent Labs    11/17/23 0546 11/18/23 0602  NA 137 138  K 3.6 3.7  CL 102 104  CO2 23 23  BUN 43* 39*  CREATININE 1.62* 1.61*  GLUCOSE 223* 214*  CALCIUM  9.4 9.3   No results for input(s): LABPT, INR in the last 72 hours.  Physical Exam:  Neurologically intact ABD soft Neurovascular intact Sensation intact distally Intact pulses distally Dorsiflexion/Plantar flexion intact Incision: dressing C/D/I  Assessment/Plan:  1 Day Post-Op Procedure(s) (LRB): FIXATION, FRACTURE, INTERTROCHANTERIC, WITH INTRAMEDULLARY ROD (Left)  WBAT LLE PT/Rehab Diet as tolerated Aspirin  81mg  BID for DVT ppx Ancef  1g q8 hours x 24 hours for surgical ppx Pain control as needed    Adine [name] Mon 11/18/2023, 7:58 AM

## 2023-11-18 NOTE — TOC Progression Note (Signed)
 Transition of Care [facility]) - Progression Note   Patient Details  Name: [name] MRN: [id number] Date of Birth: 1934-09-12  [facility]) CM/SW Contact  Duwaine GORMAN Aran, LCSW Phone Number: 11/18/2023, 3:52 PM  Clinical Narrative: PT evaluation recommended SNF. Patient agreeable to rehab. FL2 done; PASRR verified. Initial referral faxed out. Care management awaiting bed offers.  Expected Discharge Plan: Skilled Nursing Facility Barriers to Discharge: Continued Medical Work up, SNF Pending bed offer, English as a second language teacher  Expected Discharge Plan and Services In-house Referral: Clinical Social Work Post Acute Care Choice: Skilled Nursing Facility Living arrangements for the past 2 months: Single Family Home            DME Arranged: N/A DME Agency: NA  Social Drivers of Health (SDOH) Interventions SDOH Screenings   Food Insecurity: No Food Insecurity (11/16/2023)  Housing: Low Risk  (11/16/2023)  Transportation Needs: No Transportation Needs (11/16/2023)  Utilities: Not At Risk (11/16/2023)  Alcohol Screen: Low Risk  (07/23/2023)  Depression (PHQ2-9): Medium Risk (10/23/2023)  Financial Resource Strain: Low Risk  (07/23/2023)  Physical Activity: Inactive (07/23/2023)  Social Connections: Moderately Isolated (11/16/2023)  Stress: No Stress Concern Present (07/23/2023)  Tobacco Use: Low Risk  (11/17/2023)  Health Literacy: Inadequate Health Literacy (07/23/2023)   Readmission Risk Interventions    04/17/2023   10:53 AM 04/10/2023    1:41 PM  Readmission Risk Prevention Plan  Transportation Screening Complete Complete  PCP or Specialist Appt within 5-7 Days Complete Complete  Home Care Screening Complete Complete  Medication Review (RN CM) Complete Complete

## 2023-11-19 DIAGNOSIS — S72002A Fracture of unspecified part of neck of left femur, initial encounter for closed fracture: Secondary | ICD-10-CM | POA: Diagnosis not present

## 2023-11-19 LAB — GLUCOSE, CAPILLARY
Glucose-Capillary: 158 mg/dL — ABNORMAL HIGH (ref 70–99)
Glucose-Capillary: 261 mg/dL — ABNORMAL HIGH (ref 70–99)
Glucose-Capillary: 259 mg/dL — ABNORMAL HIGH (ref 70–99)
Glucose-Capillary: 159 mg/dL — ABNORMAL HIGH (ref 70–99)

## 2023-11-19 NOTE — Progress Notes (Signed)
 PROGRESS NOTE    [name]  FMW:[id number] DOB: 15-Feb-1934 DOA: 11/16/2023 PCP: [name], MD    Brief Narrative:  89 year old with history of anxiety, coronary artery disease, hyperlipidemia, type 2 diabetes on insulin , nephrolithiasis, BPH fell at home on his left hip.  Found to have intertrochanteric fracture left hip.  CT scan abdomen pelvis positive for 9.2 cm exophytic right upper lobe renal mass and also significant constipation.  Also nondisplaced fracture of the left side 9th and 10th rib. Underwent IM nailing 10/7.  Subjective: Patient seen and examined.  Son at the bedside.  After many days today patient complained of being hungry and wants to eat regular food.  He had a good bowel movement after soapsuds enema yesterday.  Still has significant pain.  Assessment & Plan:   Closed traumatic intertrochanteric fracture of the left hip. ORIF IM nailing 10/7-Dr. Sherida Weightbearing as tolerated Adequate pain medications oral and IV opiates along with laxative regimen. Aspirin  81 mg twice daily for 6 weeks as per orthopedics.  Outpatient follow-up with orthopedics.  Work with PT OT and referred to a SNF for rehab.  Right renal mass: Incidental finding of right renal mass on CT scan.  Morphologically malignant looking. Updated patient and his son at the bedside.  message sent to Dr. Renda for follow-up.  Severe constipation: MiraLAX  twice daily, Senokot twice daily.  Soapsuds enema with results.  If no adequate output, will need another enema tomorrow.  Hypertension: Blood pressure stable on Norvasc  and Coreg .  Type 2 diabetes with vascular disease: On Lantus  and sliding scale.  Holding home dose of Jardiance .  BPH: On Flomax .  AKI: Improved.  Encourage oral intake.  Discontinue IV fluids.    DVT prophylaxis: SCDs Start: 11/16/23 1147   Code Status: Full code Family Communication: Patient's son at the bedside. Disposition Plan: Status is: Inpatient Remains  inpatient appropriate because: Medically stabilizing.  Needs SNF.     Consultants:  Orthopedics  Procedures:  Left hip ORIF  Antimicrobials:  Perioperative     Objective: Vitals:   11/18/23 1754 11/18/23 2014 11/19/23 0456 11/19/23 0812  BP: (!) 134/59 133/72 (!) 153/63 (!) 176/75  Pulse: 79 (!) 59 77 79  Resp:  19 20   Temp:  (!) 97.5 F (36.4 C) 98.3 F (36.8 C)   TempSrc:  Oral Oral   SpO2: 94% 97% 96%   Weight:      Height:        Intake/Output Summary (Last 24 hours) at 11/19/2023 1246 Last data filed at 11/18/2023 1617 Gross per 24 hour  Intake 141.21 ml  Output --  Net 141.21 ml   Filed Weights   11/17/23 0949 11/17/23 1014  Weight: 100.7 kg 100.7 kg    Examination:  General exam: Frail.  Looks fairly comfortable today.  Alert and interactive. Respiratory system: Clear to auscultation. Respiratory effort normal.  Some conducted upper airway sounds. Cardiovascular system: S1 & S2 heard, RRR.  Gastrointestinal system: Abdomen is nondistended, soft and nontender. No organomegaly or masses felt. Normal bowel sounds heard. Central nervous system: Alert and oriented. No focal neurological deficits. Extremities: Symmetric 5 x 5 power. Skin: Left lateral thigh incisions clean and dry.  Mild swelling present.    Data Reviewed: I have personally reviewed following labs and imaging studies  CBC: Recent Labs  Lab 11/16/23 0820 11/17/23 0546 11/18/23 0602  WBC 14.2* 12.2* 12.4*  NEUTROABS 10.8*  --   --   HGB 14.6 11.4* 9.8*  HCT 44.3 35.9* 30.7*  MCV 92.1 94.2 97.2  PLT 307 250 221   Basic Metabolic Panel: Recent Labs  Lab 11/16/23 0820 11/17/23 0546 11/18/23 0602  NA 134* 137 138  K 3.4* 3.6 3.7  CL 101 102 104  CO2 17* 23 23  GLUCOSE 380* 223* 214*  BUN 42* 43* 39*  CREATININE 1.53* 1.62* 1.61*  CALCIUM  9.8 9.4 9.3   GFR: Estimated Creatinine Clearance: 39.8 mL/min (A) (by C-G formula based on SCr of 1.61 mg/dL (H)). Liver Function  Tests: Recent Labs  Lab 11/17/23 0546  AST 16  ALT 14  ALKPHOS 58  BILITOT 0.5  PROT 5.6*  ALBUMIN 3.6   No results for input(s): LIPASE, AMYLASE in the last 168 hours. No results for input(s): AMMONIA in the last 168 hours. Coagulation Profile: No results for input(s): INR, PROTIME in the last 168 hours. Cardiac Enzymes: No results for input(s): CKTOTAL, CKMB, CKMBINDEX, TROPONINI in the last 168 hours. BNP (last 3 results) No results for input(s): PROBNP in the last 8760 hours. HbA1C: No results for input(s): HGBA1C in the last 72 hours. CBG: Recent Labs  Lab 11/18/23 1225 11/18/23 1659 11/18/23 2050 11/19/23 0736 11/19/23 1130  GLUCAP 162* 252* 136* 158* 261*   Lipid Profile: No results for input(s): CHOL, HDL, LDLCALC, TRIG, CHOLHDL, LDLDIRECT in the last 72 hours. Thyroid  Function Tests: No results for input(s): TSH, T4TOTAL, FREET4, T3FREE, THYROIDAB in the last 72 hours. Anemia Panel: No results for input(s): VITAMINB12, FOLATE, FERRITIN, TIBC, IRON, RETICCTPCT in the last 72 hours. Sepsis Labs: No results for input(s): PROCALCITON, LATICACIDVEN in the last 168 hours.  No results found for this or any previous visit (from the past 240 hours).       Radiology Studies: DG HIP UNILAT WITH PELVIS 2-3 VIEWS LEFT Result Date: 11/17/2023 CLINICAL DATA:  Elective surgery. EXAM: DG HIP (WITH OR WITHOUT PELVIS) 2-3V LEFT COMPARISON:  Radiograph 11/16/2023 FINDINGS: Five fluoroscopic spot views of the left hip submitted from the operating room. Imaging obtained during femoral intramedullary nail with trans trochanteric and distal locking screw fixation of proximal femur fracture. Fluoroscopy time 49 seconds. Dose 19.618 mGy. IMPRESSION: Intraoperative fluoroscopy during left proximal femur fracture ORIF. Electronically Signed   By: Andrea Gasman M.D.   On: 11/17/2023 15:10   DG C-Arm 1-60 Min-No  Report Result Date: 11/17/2023 Fluoroscopy was utilized by the requesting physician.  No radiographic interpretation.        Scheduled Meds:  amLODipine   5 mg Oral Daily   aspirin  EC  81 mg Oral BID   carvedilol   12.5 mg Oral BID WC   cyanocobalamin   1,000 mcg Oral Daily   escitalopram   20 mg Oral Daily   hydrALAZINE   50 mg Oral TID   insulin  aspart  0-20 Units Subcutaneous TID WC   insulin  glargine  8 Units Subcutaneous QHS   multivitamin with minerals  1 tablet Oral Daily   polyethylene glycol  17 g Oral BID   Ensure Max Protein  11 oz Oral BID   senna-docusate  1 tablet Oral BID   tamsulosin   0.4 mg Oral Daily   Continuous Infusions:     LOS: 3 days    Time spent: 51 minutes    Renato Applebaum, MD [facility]

## 2023-11-19 NOTE — Plan of Care (Signed)
  Problem: Education: Goal: Ability to describe self-care measures that may prevent or decrease complications (Diabetes Survival Skills Education) will improve Outcome: Progressing   Problem: Coping: Goal: Ability to adjust to condition or change in health will improve Outcome: Progressing   Problem: Fluid Volume: Goal: Ability to maintain a balanced intake and output will improve Outcome: Progressing   Problem: Nutritional: Goal: Maintenance of adequate nutrition will improve Outcome: Progressing Goal: Progress toward achieving an optimal weight will improve Outcome: Progressing   Problem: Health Behavior/Discharge Planning: Goal: Ability to manage health-related needs will improve Outcome: Progressing   Problem: Clinical Measurements: Goal: Ability to maintain clinical measurements within normal limits will improve Outcome: Progressing Goal: Cardiovascular complication will be avoided Outcome: Progressing   Problem: Activity: Goal: Risk for activity intolerance will decrease Outcome: Progressing   Problem: Elimination: Goal: Will not experience complications related to urinary retention Outcome: Progressing   Problem: Skin Integrity: Goal: Risk for impaired skin integrity will decrease Outcome: Progressing

## 2023-11-19 NOTE — Progress Notes (Signed)
.  Subjective: 2 Days Post-Op Procedure(s) (LRB): FIXATION, FRACTURE, INTERTROCHANTERIC, WITH INTRAMEDULLARY ROD (Left)  Seen and examined. Patient had significant pain and difficulty with PT yesterday. Likely SNF at discharge  Activity level:  WBAT Diet tolerance:  as tolerated Patient reports pain as severe.    Objective: Vital signs in last 24 hours: Temp:  [97.5 F (36.4 C)-98.3 F (36.8 C)] 98.3 F (36.8 C) (10/09 0456) Pulse Rate:  [59-79] 77 (10/09 0456) Resp:  [19-20] 20 (10/09 0456) BP: (110-153)/(48-72) 153/63 (10/09 0456) SpO2:  [94 %-100 %] 96 % (10/09 0456)  Labs: Recent Labs    11/16/23 0820 11/17/23 0546 11/18/23 0602  HGB 14.6 11.4* 9.8*   Recent Labs    11/17/23 0546 11/18/23 0602  WBC 12.2* 12.4*  RBC 3.81* 3.16*  HCT 35.9* 30.7*  PLT 250 221   Recent Labs    11/17/23 0546 11/18/23 0602  NA 137 138  K 3.6 3.7  CL 102 104  CO2 23 23  BUN 43* 39*  CREATININE 1.62* 1.61*  GLUCOSE 223* 214*  CALCIUM  9.4 9.3   No results for input(s): LABPT, INR in the last 72 hours.  Physical Exam:  Neurologically intact ABD soft Neurovascular intact Sensation intact distally Intact pulses distally Dorsiflexion/Plantar flexion intact Incision: dressing C/D/I  Assessment/Plan:  2 Days Post-Op Procedure(s) (LRB): FIXATION, FRACTURE, INTERTROCHANTERIC, WITH INTRAMEDULLARY ROD (Left)  WBAT LLE PT/Rehab Diet as tolerated Given patient's difficulty with movement/transfer to chair would consider starting daily lovenox  for DVT ppx with transition to aspirin  81 BID at discharge Pain control as needed Continue aggressive bowel regimen  [name] Mon 11/19/2023, 8:00 AM

## 2023-11-19 NOTE — Progress Notes (Signed)
 Physical Therapy Treatment Patient Details Name: [name] MRN: [id number] DOB: 04-16-1934 Today's Date: 11/19/2023   History of Present Illness Pt is an 89 y.o. male whom presents to therapy following mechanical fall in home setting in which pt struck L hip, flank and mid back as well as forehead. Fall resulted in L intertrochanteric femur fx and L 9 and 10th rib fx. Pt underwent L hip cephalomedulary nailing on 11/17/2023 and is currently L LE WBAT. Pt PMH includes but is not limited to: HFmrEF, insulin -dependent DM2, HTN, hyperlipidemia, BPH, anxiety, and CAD.    PT Comments  POD # 2 am session Had Pt pre medicated with 5mg  OXY 30 min prior to session Assisted OOB required + 2 assist and increased time.  General bed mobility comments: Required + 2 assist and increased time to transition from supine to EOB using bed pad to complete scooting.  Pt reports increased hip pain and decreased weight shift to left side. General transfer comment: Pt required Max Assist + 2 to rise from elevated bed with 50% VC's on proper hand placement and upright posture.  Increased hip pain but Pt was able to perform self PWB. General Gait Details: Pt was able to amb 4 feet + 2 side by side assist with walker and recliner following closely behind.  Distance limited by pain and fatigue. Positioned in recliner with pillows.  ICE applied to L hip. Prior to admit, Pt was IND with amb and ADL's living with [name].  LPT has rec Pt will need ST Rehab at SNF to address mobility and functional decline prior to safely returning home.    If plan is discharge home, recommend the following: Two people to help with walking and/or transfers;Two people to help with bathing/dressing/bathroom;Assistance with cooking/housework;Assist for transportation;Help with stairs or ramp for entrance   Can travel by private vehicle     No  Equipment Recommendations  None recommended by PT    Recommendations for Other Services        Precautions / Restrictions Precautions Precautions: Fall Restrictions Weight Bearing Restrictions Per Provider Order: No LLE Weight Bearing Per Provider Order: Weight bearing as tolerated     Mobility  Bed Mobility Overal bed mobility: Needs Assistance Bed Mobility: Supine to Sit     Supine to sit: Max assist, Total assist, +2 for physical assistance, +2 for safety/equipment     General bed mobility comments: Required + 2 assist and increased time to transition from supine to EOB using bed pad to complete scooting.  Pt reports increased hip pain and decreased weight shift to left side.    Transfers Overall transfer level: Needs assistance Equipment used: Rolling walker (2 wheels) Transfers: Sit to/from Stand Sit to Stand: Max assist, +2 physical assistance, +2 safety/equipment, From elevated surface           General transfer comment: Pt required Max Assist + 2 to rise from elevated bed with 50% VC's on proper hand placement and upright posture.  Increased hip pain but Pt was able to perform self PWB.    Ambulation/Gait Ambulation/Gait assistance: Max assist, +2 physical assistance, +2 safety/equipment Gait Distance (Feet): 4 Feet Assistive device: Rolling walker (2 wheels) Gait Pattern/deviations: Step-to pattern, Decreased stance time - left, Trunk flexed, Decreased step length - left, Decreased step length - right Gait velocity: decreased     General Gait Details: Pt was able to amb 4 feet + 2 side by side assist with walker and recliner following closely behind.  Distance limited by pain and fatigue.   Stairs             Wheelchair Mobility     Tilt Bed    Modified Rankin (Stroke Patients Only)       Balance                                            Communication Communication Communication: No apparent difficulties Factors Affecting Communication: Hearing impaired  Cognition Arousal: Alert Behavior During Therapy: Flat  affect   PT - Cognitive impairments: No apparent impairments                       PT - Cognition Comments: Improved alertness, feels tired and sore    following all commands. Following commands: Intact      Cueing Cueing Techniques: Verbal cues  Exercises      General Comments        Pertinent Vitals/Pain Pain Assessment Pain Assessment: Faces Faces Pain Scale: Hurts even more Pain Location: L hip and LE Pain Descriptors / Indicators: Aching, Constant, Discomfort, Grimacing, Operative site guarding, Sore Pain Intervention(s): Monitored during session, Premedicated before session, Ice applied    Home Living                          Prior Function            PT Goals (current goals can now be found in the care plan section) Progress towards PT goals: Progressing toward goals    Frequency    Min 3X/week      PT Plan      Co-evaluation              AM-PAC PT 6 Clicks Mobility   Outcome Measure  Help needed turning from your back to your side while in a flat bed without using bedrails?: A Lot Help needed moving from lying on your back to sitting on the side of a flat bed without using bedrails?: A Lot Help needed moving to and from a bed to a chair (including a wheelchair)?: A Lot Help needed standing up from a chair using your arms (e.g., wheelchair or bedside chair)?: A Lot Help needed to walk in hospital room?: A Lot Help needed climbing 3-5 steps with a railing? : Total 6 Click Score: 11    End of Session Equipment Utilized During Treatment: Gait belt Activity Tolerance: Patient limited by fatigue;Patient limited by pain Patient left: in chair;with call bell/phone within reach;with chair alarm set Nurse Communication: Mobility status;Need for lift equipment;Other (comment) PT Visit Diagnosis: Unsteadiness on feet (R26.81);Other abnormalities of gait and mobility (R26.89);Muscle weakness (generalized) (M62.81);History of  falling (Z91.81);Difficulty in walking, not elsewhere classified (R26.2);Pain Pain - Right/Left: Left Pain - part of body: Knee;Leg;Hip     Time: 8858-8783 PT Time Calculation (min) (ACUTE ONLY): 35 min  Charges:    $Gait Training: 8-22 mins $Therapeutic Activity: 8-22 mins PT General Charges $$ ACUTE PT VISIT: 1 Visit                     Katheryn Leap  PTA Acute  Rehabilitation Services Office M-F          [phone]

## 2023-11-19 NOTE — TOC Progression Note (Signed)
 Transition of Care [facility]) - Progression Note    Patient Details  Name: [name] MRN: [id number] Date of Birth: [birth year]  Transition of Care [facility]) CM/SW Contact  Tawni CHRISTELLA Eva, LCSW Phone Number: 11/19/2023, 3:20 PM  Clinical Narrative:     CSW met with pt to present bed offers. Pt is requesting [facility]. CSW has sent message to admission for review. Pt is requesting time to review bed and speak with his son. ICM to follow.   Expected Discharge Plan: Skilled Nursing Facility Barriers to Discharge: Continued Medical Work up, SNF Pending bed offer, English as a second language teacher               Expected Discharge Plan and Services In-house Referral: Clinical Social Work   Post Acute Care Choice: Skilled Nursing Facility Living arrangements for the past 2 months: Single Family Home                 DME Arranged: N/A DME Agency: NA                   Social Drivers of Health (SDOH) Interventions SDOH Screenings   Food Insecurity: No Food Insecurity (11/16/2023)  Housing: Low Risk  (11/16/2023)  Transportation Needs: No Transportation Needs (11/16/2023)  Utilities: Not At Risk (11/16/2023)  Alcohol Screen: Low Risk  (07/23/2023)  Depression (PHQ2-9): Medium Risk (10/23/2023)  Financial Resource Strain: Low Risk  (07/23/2023)  Physical Activity: Inactive (07/23/2023)  Social Connections: Moderately Isolated (11/16/2023)  Stress: No Stress Concern Present (07/23/2023)  Tobacco Use: Low Risk  (11/17/2023)  Health Literacy: Inadequate Health Literacy (07/23/2023)    Readmission Risk Interventions    04/17/2023   10:53 AM 04/10/2023    1:41 PM  Readmission Risk Prevention Plan  Transportation Screening Complete Complete  PCP or Specialist Appt within 5-7 Days Complete Complete  Home Care Screening Complete Complete  Medication Review (RN CM) Complete Complete

## 2023-11-19 NOTE — Progress Notes (Signed)
 Physical Therapy Treatment Patient Details Name: [name] MRN: [id number] DOB: Dec 08, 1934 Today's Date: 11/19/2023   History of Present Illness Pt is an 89 y.o. male whom presents to therapy following mechanical fall in home setting in which pt struck L hip, flank and mid back as well as forehead. Fall resulted in L intertrochanteric femur fx and L 9 and 10th rib fx. Pt underwent L hip cephalomedulary nailing on 11/17/2023 and is currently L LE WBAT. Pt PMH includes but is not limited to: HFmrEF, insulin -dependent DM2, HTN, hyperlipidemia, BPH, anxiety, and CAD.    PT Comments  Pt hit the call light requesting to get back to bed.  Called to room to assist.  General transfer comment: Required + 2 side by side Max Assist to rise from recliner and assist to lower to seated EOB with increased pain. General bed mobility comments: Assisted back to bed required + 2 Total Assist and positined to comfort.  Positioned to comfort. Applied ICE.  Prior to admit, Pt was IND with amb and ADL's living with Son.  LPT has rec Pt will need ST Rehab at SNF to address mobility and functional decline prior to safely returning home.     If plan is discharge home, recommend the following: Two people to help with walking and/or transfers;Two people to help with bathing/dressing/bathroom;Assistance with cooking/housework;Assist for transportation;Help with stairs or ramp for entrance   Can travel by private vehicle     No  Equipment Recommendations  None recommended by PT    Recommendations for Other Services       Precautions / Restrictions Precautions Precautions: Fall Restrictions Weight Bearing Restrictions Per Provider Order: No LLE Weight Bearing Per Provider Order: Weight bearing as tolerated     Mobility  Bed Mobility Overal bed mobility: Needs Assistance Bed Mobility: Sit to Supine      Sit to supine: Total assist, +2 for physical assistance, +2 for safety/equipment   General bed  mobility comments: Assisted back to bed required + 2 Total Assist and positined to comfort.  Positioned to comfort.    Transfers Overall transfer level: Needs assistance Equipment used: Rolling walker (2 wheels) Transfers: Sit to/from Stand Sit to Stand: Max assist, +2 physical assistance, +2 safety/equipment, From elevated surface           General transfer comment: Required + 2 side by side Max Assist to rise from recliner and assist to lower to seated EOB with increased pain.    Ambulation/Gait     General Gait Details: Stand pivot 1/4 turn from recliner back to bed a few side/turning steps with assist to navigate walker.   Stairs             Wheelchair Mobility     Tilt Bed    Modified Rankin (Stroke Patients Only)       Balance                                            Communication Communication Communication: No apparent difficulties Factors Affecting Communication: Hearing impaired  Cognition Arousal: Alert Behavior During Therapy: Flat affect   PT - Cognitive impairments: No apparent impairments                       PT - Cognition Comments: Improved alertness, feels tired and sore    following all commands.  Following commands: Intact      Cueing Cueing Techniques: Verbal cues  Exercises      General Comments        Pertinent Vitals/Pain Pain Assessment Pain Assessment: Faces Faces Pain Scale: Hurts even more Pain Location: L hip and LE Pain Descriptors / Indicators: Aching, Constant, Discomfort, Grimacing, Operative site guarding, Sore Pain Intervention(s): Monitored during session, Premedicated before session, Ice applied    Home Living                          Prior Function            PT Goals (current goals can now be found in the care plan section) Progress towards PT goals: Progressing toward goals    Frequency    Min 3X/week      PT Plan      Co-evaluation               AM-PAC PT 6 Clicks Mobility   Outcome Measure  Help needed turning from your back to your side while in a flat bed without using bedrails?: A Lot Help needed moving from lying on your back to sitting on the side of a flat bed without using bedrails?: A Lot Help needed moving to and from a bed to a chair (including a wheelchair)?: A Lot Help needed standing up from a chair using your arms (e.g., wheelchair or bedside chair)?: A Lot Help needed to walk in hospital room?: A Lot Help needed climbing 3-5 steps with a railing? : Total 6 Click Score: 11    End of Session Equipment Utilized During Treatment: Gait belt Activity Tolerance: Patient limited by fatigue;Patient limited by pain Patient left: in bed;with call bell/phone within reach;with bed alarm set Nurse Communication: Mobility status;Need for lift equipment;Other (comment) PT Visit Diagnosis: Unsteadiness on feet (R26.81);Other abnormalities of gait and mobility (R26.89);Muscle weakness (generalized) (M62.81);History of falling (Z91.81);Difficulty in walking, not elsewhere classified (R26.2);Pain Pain - Right/Left: Left Pain - part of body: Knee;Leg;Hip     Time: 8569-8557 PT Time Calculation (min) (ACUTE ONLY): 12 min  Charges:    $Gait Training: 8-22 mins $Therapeutic Activity: 8-22 mins PT General Charges $$ ACUTE PT VISIT: 1 Visit                     {Codi Folkerts  PTA Acute  Rehabilitation Services Office M-F          [phone]

## 2023-11-19 NOTE — Plan of Care (Signed)

## 2023-11-20 ENCOUNTER — Inpatient Hospital Stay (HOSPITAL_COMMUNITY)

## 2023-11-20 DIAGNOSIS — W19XXXA Unspecified fall, initial encounter: Secondary | ICD-10-CM | POA: Diagnosis not present

## 2023-11-20 DIAGNOSIS — S72002A Fracture of unspecified part of neck of left femur, initial encounter for closed fracture: Secondary | ICD-10-CM | POA: Diagnosis not present

## 2023-11-20 LAB — CBC
HCT: 30.3 % — ABNORMAL LOW (ref 39.0–52.0)
Hemoglobin: 9.7 g/dL — ABNORMAL LOW (ref 13.0–17.0)
MCH: 30.6 pg (ref 26.0–34.0)
MCHC: 32 g/dL (ref 30.0–36.0)
MCV: 95.6 fL (ref 80.0–100.0)
Platelets: 252 K/uL (ref 150–400)
RBC: 3.17 MIL/uL — ABNORMAL LOW (ref 4.22–5.81)
RDW: 13.4 % (ref 11.5–15.5)
WBC: 8.9 K/uL (ref 4.0–10.5)
nRBC: 0 % (ref 0.0–0.2)

## 2023-11-20 LAB — BASIC METABOLIC PANEL WITH GFR
Anion gap: 8 (ref 5–15)
BUN: 39 mg/dL — ABNORMAL HIGH (ref 8–23)
CO2: 25 mmol/L (ref 22–32)
Calcium: 9.3 mg/dL (ref 8.9–10.3)
Chloride: 101 mmol/L (ref 98–111)
Creatinine, Ser: 1.2 mg/dL (ref 0.61–1.24)
GFR, Estimated: 59 mL/min — ABNORMAL LOW (ref >60)
Glucose, Bld: 237 mg/dL — ABNORMAL HIGH (ref 70–99)
Potassium: 4 mmol/L (ref 3.5–5.1)
Sodium: 134 mmol/L — ABNORMAL LOW (ref 135–145)

## 2023-11-20 LAB — GLUCOSE, CAPILLARY
Glucose-Capillary: 248 mg/dL — ABNORMAL HIGH (ref 70–99)
Glucose-Capillary: 261 mg/dL — ABNORMAL HIGH (ref 70–99)
Glucose-Capillary: 160 mg/dL — ABNORMAL HIGH (ref 70–99)
Glucose-Capillary: 273 mg/dL — ABNORMAL HIGH (ref 70–99)

## 2023-11-20 MED ORDER — INSULIN GLARGINE 100 UNIT/ML ~~LOC~~ SOLN
13.0000 [IU] | Freq: Every day | SUBCUTANEOUS | Status: DC
  Administered 2023-11-20: 13 [IU] via SUBCUTANEOUS
  Filled 2023-11-20 (×2): qty 0.13

## 2023-11-20 MED ORDER — SMOG ENEMA
960.0000 mL | Freq: Once | RECTAL | Status: AC
  Administered 2023-11-20: 960 mL via RECTAL
  Filled 2023-11-20: qty 960

## 2023-11-20 MED ORDER — ORAL CARE MOUTH RINSE: 15.0000 mL | OROMUCOSAL | Status: DC | PRN

## 2023-11-20 NOTE — Progress Notes (Signed)
 PT Cancellation Note  Patient Details Name: [name] MRN: [id number] DOB: [birth year]   Cancelled Treatment:     Attempted to see Pt twice AM.....about to go down stairs for a imaging PM.....about to get an enema Pt has been evaluated with rec for SNF for ST Rehab.  Attempt to see another day as schedule permits.   Katheryn Leap  PTA Acute  Rehabilitation Services Office M-F          [phone]

## 2023-11-20 NOTE — TOC Progression Note (Addendum)
 Transition of Care [facility]) - Progression Note    Patient Details  Name: [name] MRN: [id number] Date of Birth: 10-Jun-1934  Transition of Care [facility]) CM/SW Contact  Lorraine LILLETTE Fenton, LCSW Phone Number: 11/20/2023, 1:27 PM  Clinical Narrative:    CSW learned [facility] offered a bed to pt. Visit to pt, granddaughter along with her son present.  Shared with pt that I have a bed offer, [facility] is offering, granddaughter and pt responded that he had been there before. Pt then said to call his son to decide where he will go.  GD called his son- son shared that they will accept [facility] as first choice or [facility] and directed GD to a highlighted sheet in the room where those two were agreed on, pt in agreement. CSW agreed to contact [place] and explained there is an authorization process.  Call to [facility], admissions rep in a meeting. ICM following.   Follow up to [facility] accepts pt but cannot offer a bed until Monday. [place] to MD advising. ICM following.  Expected Discharge Plan: Skilled Nursing Facility Barriers to Discharge: Continued Medical Work up               Expected Discharge Plan and Services In-house Referral: Clinical Social Work   Post Acute Care Choice: Skilled Nursing Facility Living arrangements for the past 2 months: Single Family Home                 DME Arranged: N/A DME Agency: NA                   Social Drivers of Health (SDOH) Interventions SDOH Screenings   Food Insecurity: No Food Insecurity (11/16/2023)  Housing: Low Risk  (11/16/2023)  Transportation Needs: No Transportation Needs (11/16/2023)  Utilities: Not At Risk (11/16/2023)  Alcohol Screen: Low Risk  (07/23/2023)  Depression (PHQ2-9): Medium Risk (10/23/2023)  Financial Resource Strain: Low Risk  (07/23/2023)  Physical Activity: Inactive (07/23/2023)  Social Connections: Moderately Isolated (11/16/2023)  Stress: No Stress Concern Present (07/23/2023)  Tobacco Use: Low Risk   (11/17/2023)  Health Literacy: Inadequate Health Literacy (07/23/2023)    Readmission Risk Interventions    04/17/2023   10:53 AM 04/10/2023    1:41 PM  Readmission Risk Prevention Plan  Transportation Screening Complete Complete  PCP or Specialist Appt within 5-7 Days Complete Complete  Home Care Screening Complete Complete  Medication Review (RN CM) Complete Complete

## 2023-11-20 NOTE — Inpatient Diabetes Management (Signed)
 Inpatient Diabetes Program Recommendations  AACE/ADA: New Consensus Statement on Inpatient Glycemic Control (2015)  Target Ranges:  Prepandial:   less than 140 mg/dL      Peak postprandial:   less than 180 mg/dL (1-2 hours)      Critically ill patients:  140 - 180 mg/dL   Lab Results  Component Value Date   GLUCAP 261 (H) 11/20/2023   HGBA1C 8.8 (A) 09/29/2023    Review of Glycemic Control  Latest Reference Range & Units 11/19/23 07:36 11/19/23 11:30 11/19/23 16:46 11/19/23 22:03 11/20/23 07:16 11/20/23 12:02  Glucose-Capillary 70 - 99 mg/dL 841 (H) 738 (H) 740 (H) 159 (H) 248 (H) 261 (H)  (H): Data is abnormally high  Diabetes history: DM2 Outpatient Diabetes medications: [name]  16 units every day, Jardiance  10 mg QD Current orders for Inpatient glycemic control: Lantus  16 units at bedtime, Novolog  0-20 units TID  Inpatient Diabetes Program Recommendations:    Please consider increasing basal to home dose:  Lantus  16 units every day  Thank you, Wyvonna Pinal, MSN, CDCES Diabetes Coordinator Inpatient Diabetes Program [phone] (team pager from 8a-5p)

## 2023-11-20 NOTE — Progress Notes (Signed)
 PROGRESS NOTE    [name]  FMW:[id number] DOB: Jul 05, 1934 DOA: 11/16/2023 PCP: [name], MD    Brief Narrative:  89 year old with history of anxiety, coronary artery disease, hyperlipidemia, type 2 diabetes on insulin , nephrolithiasis, BPH fell at home on his left hip.  Found to have intertrochanteric fracture left hip.  CT scan abdomen pelvis positive for 9.2 cm exophytic right upper lobe renal mass and also significant constipation.  Also nondisplaced fracture of the left side 9th and 10th rib.  Underwent IM nailing 10/7.  Subjective: Patient alert, report not feeling well, report he has not had BM.    Assessment & Plan:   Closed traumatic intertrochanteric fracture of the left hip. -ORIF IM nailing 10/7-Dr. Sherida -Weightbearing as tolerated -Pain management. Bowel regimen.  -Aspirin  81 mg twice daily for 6 weeks as per orthopedics.  Outpatient follow-up with orthopedics.  Work with PT OT and referred to a SNF for rehab.  nondisplaced fracture of the left side 9th and 10th rib. -pain control.  -incentive spirometry.   Large abdominal wall hematoma;  Will proceed with repeating CT abdomen pelvis Hb has decreased from 14 on admission to 9.  Hb has been stable over last two days.   Right renal mass: Incidental finding of right renal mass on CT scan.  Morphologically malignant looking. -Updated patient and his son at the bedside. -message was sent to Dr. Renda for follow-up.  Severe constipation: MiraLAX  twice daily, Senokot twice daily.  No BM in last two days per nurse report.  Plan to repeat SMOG enema.   Hypertension: Continue with Norvasc  and Coreg .  Type 2 diabetes with vascular disease: On Lantus  and sliding scale.  Holding home dose of Jardiance . Increase Lantus  to 13 units.   BPH: continue with  Flomax .  AKI: Improved.  NSL     DVT prophylaxis: SCDs Start: 11/16/23 1147   Code Status: Full code Family Communication: Disposition Plan:  Status is: Inpatient Remains inpatient appropriate because: Medically stabilizing.  Needs SNF.     Consultants:  Orthopedics  Procedures:  Left hip ORIF  Antimicrobials:  Perioperative     Objective: Vitals:   11/19/23 2245 11/20/23 0509 11/20/23 0900 11/20/23 1200  BP: (!) 148/76 (!) 156/76 (!) 183/84 (!) 178/69  Pulse:  77 90 82  Resp:  (!) 24  20  Temp:  98.6 F (37 C)  98.3 F (36.8 C)  TempSrc:  Oral    SpO2:  96%  96%  Weight:      Height:        Intake/Output Summary (Last 24 hours) at 11/20/2023 1317 Last data filed at 11/20/2023 0400 Gross per 24 hour  Intake --  Output 1150 ml  Net -1150 ml   Filed Weights   11/17/23 0949 11/17/23 1014  Weight: 100.7 kg 100.7 kg    Examination:  General exam: Frail. NAD Respiratory system; CTA Cardiovascular system: S1 & S2 heard, RRR.  Gastrointestinal system: BS present, soft, nt, large hematoma right abdominal wall Central nervous system: alert answer questions.  Extremities: no edema, left thigh with clean dressing.    Data Reviewed: I have personally reviewed following labs and imaging studies  CBC: Recent Labs  Lab 11/16/23 0820 11/17/23 0546 11/18/23 0602 11/20/23 0648  WBC 14.2* 12.2* 12.4* 8.9  NEUTROABS 10.8*  --   --   --   HGB 14.6 11.4* 9.8* 9.7*  HCT 44.3 35.9* 30.7* 30.3*  MCV 92.1 94.2 97.2 95.6  PLT 307 250  221 252   Basic Metabolic Panel: Recent Labs  Lab 11/16/23 0820 11/17/23 0546 11/18/23 0602 11/20/23 0648  NA 134* 137 138 134*  K 3.4* 3.6 3.7 4.0  CL 101 102 104 101  CO2 17* 23 23 25   GLUCOSE 380* 223* 214* 237*  BUN 42* 43* 39* 39*  CREATININE 1.53* 1.62* 1.61* 1.20  CALCIUM  9.8 9.4 9.3 9.3   GFR: Estimated Creatinine Clearance: 53.4 mL/min (by C-G formula based on SCr of 1.2 mg/dL). Liver Function Tests: Recent Labs  Lab 11/17/23 0546  AST 16  ALT 14  ALKPHOS 58  BILITOT 0.5  PROT 5.6*  ALBUMIN 3.6   No results for input(s): LIPASE, AMYLASE in the  last 168 hours. No results for input(s): AMMONIA in the last 168 hours. Coagulation Profile: No results for input(s): INR, PROTIME in the last 168 hours. Cardiac Enzymes: No results for input(s): CKTOTAL, CKMB, CKMBINDEX, TROPONINI in the last 168 hours. BNP (last 3 results) No results for input(s): PROBNP in the last 8760 hours. HbA1C: No results for input(s): HGBA1C in the last 72 hours. CBG: Recent Labs  Lab 11/19/23 1130 11/19/23 1646 11/19/23 2203 11/20/23 0716 11/20/23 1202  GLUCAP 261* 259* 159* 248* 261*   Lipid Profile: No results for input(s): CHOL, HDL, LDLCALC, TRIG, CHOLHDL, LDLDIRECT in the last 72 hours. Thyroid  Function Tests: No results for input(s): TSH, T4TOTAL, FREET4, T3FREE, THYROIDAB in the last 72 hours. Anemia Panel: No results for input(s): VITAMINB12, FOLATE, FERRITIN, TIBC, IRON, RETICCTPCT in the last 72 hours. Sepsis Labs: No results for input(s): PROCALCITON, LATICACIDVEN in the last 168 hours.  No results found for this or any previous visit (from the past 240 hours).       Radiology Studies: No results found.       Scheduled Meds:  amLODipine   5 mg Oral Daily   aspirin  EC  81 mg Oral BID   carvedilol   12.5 mg Oral BID WC   cyanocobalamin   1,000 mcg Oral Daily   escitalopram   20 mg Oral Daily   hydrALAZINE   50 mg Oral TID   insulin  aspart  0-20 Units Subcutaneous TID WC   insulin  glargine  8 Units Subcutaneous QHS   multivitamin with minerals  1 tablet Oral Daily   polyethylene glycol  17 g Oral BID   Ensure Max Protein  11 oz Oral BID   senna-docusate  1 tablet Oral BID   SMOG  960 mL Rectal Once   tamsulosin   0.4 mg Oral Daily   Continuous Infusions:     LOS: 4 days    Time spent: 51 minutes    Owen DELENA Lore, MD [facility]

## 2023-11-20 NOTE — Progress Notes (Signed)
.  Subjective: 3 Days Post-Op Procedure(s) (LRB): FIXATION, FRACTURE, INTERTROCHANTERIC, WITH INTRAMEDULLARY ROD (Left)  Seen and examined. Able to get to bedside chair yesterday. Had bowel movement   Activity level:  WBAT Diet tolerance:  as tolerated Patient reports pain as severe.    Objective: Vital signs in last 24 hours: Temp:  [97.8 F (36.6 C)-98.6 F (37 C)] 98.6 F (37 C) (10/10 0509) Pulse Rate:  [63-79] 77 (10/10 0509) Resp:  [18-24] 24 (10/10 0509) BP: (122-176)/(60-78) 156/76 (10/10 0509) SpO2:  [89 %-96 %] 96 % (10/10 0509)  Labs: Recent Labs    11/18/23 0602 11/20/23 0648  HGB 9.8* 9.7*   Recent Labs    11/18/23 0602 11/20/23 0648  WBC 12.4* 8.9  RBC 3.16* 3.17*  HCT 30.7* 30.3*  PLT 221 252   Recent Labs    11/18/23 0602 11/20/23 0648  NA 138 134*  K 3.7 4.0  CL 104 101  CO2 23 25  BUN 39* 39*  CREATININE 1.61* 1.20  GLUCOSE 214* 237*  CALCIUM  9.3 9.3   No results for input(s): LABPT, INR in the last 72 hours.  Physical Exam:   Neurologically intact ABD soft Neurovascular intact Sensation intact distally Intact pulses distally Dorsiflexion/Plantar flexion intact Incision: dressing C/D/I, moderate swelling around thigh  Assessment/Plan:  3 Days Post-Op Procedure(s) (LRB): FIXATION, FRACTURE, INTERTROCHANTERIC, WITH INTRAMEDULLARY ROD (Left)  WBAT LLE PT/Rehab Diet as tolerated Given patient's difficulty with movement/transfer to chair would consider starting daily lovenox  for DVT ppx with transition to aspirin  81 BID at discharge Pain control as needed Continue aggressive bowel regimen Patient can follow up in the office 2 weeks after surgery date for incision check and new XR    [name] [name] Mon 11/20/2023, 7:31 AM

## 2023-11-20 NOTE — NC FL2 (Signed)
 [place]  MEDICAID FL2 LEVEL OF CARE FORM     IDENTIFICATION  Patient Name: [name] Birthdate: 01/10/1935 Sex: male Admission Date (Current Location): 11/16/2023  [facility] and [place] Number:  Producer, television/film/video and Address:  [facility],  [address], [place] [address]      Provider Number: [id number]  Attending Physician Name and Address:  Madelyne Owen LABOR, MD  Relative Name and Phone Number:  Diondre Pulis [facility]) Ph: [phone]    Current Level of Care: Hospital Recommended Level of Care: Skilled Nursing Facility Prior Approval Number:    Date Approved/Denied:   PASRR Number: [id number]  Discharge Plan: SNF    Current Diagnoses: Patient Active Problem List   Diagnosis Date Noted   Closed left hip fracture (HCC) 11/16/2023   Pseudohyponatremia 11/16/2023   History of pneumonia 09/30/2023   CHF (congestive heart failure) (HCC) 06/10/2023   Acute on chronic combined systolic and diastolic heart failure (HCC) 06/08/2023   Obesity, class 1 04/11/2023   Acute respiratory failure with hypoxia (HCC) 04/09/2023   Acute on chronic systolic CHF (congestive heart failure) (HCC) 04/09/2023   Psychogenic nonepileptic seizure 02/01/2023   Obesity (BMI 30-39.9) 01/23/2023   Memory change 07/31/2022   Drug-induced myopathy 07/28/2021   Tachycardia 01/09/2021   DNR (do not resuscitate) 04/13/2020   Hematoma 04/05/2020   Epigastric abdominal pain 04/01/2020   History of melanoma 2022   Medicare annual wellness visit, subsequent 01/03/2019   Recurrent Epistaxis 03/02/2017   Vitamin B 12 deficiency 08/17/2015   Elevated TSH 10/06/2014   AKI (acute kidney injury) 10/05/2014   Unsteady gait 09/18/2014   Midline low back pain without sciatica 09/18/2014   Tremor 05/04/2014   Chronic combined systolic and diastolic congestive heart failure (HCC) 01/27/2014   Ataxia 01/10/2014   UTI (urinary tract infection) 01/02/2014   History of coronary artery  disease    Hypokalemia    Chest pain 12/15/2013   Healthcare maintenance 11/10/2013   Advance care planning 11/10/2013   ED (erectile dysfunction) 06/24/2013   HLD (hyperlipidemia) 03/22/2012   Type 2 diabetes mellitus with vascular disease (HCC) 03/22/2012   Thyroid  cyst 03/19/2011   BPH with obstruction/lower urinary tract symptoms 03/15/2010   GASTRITIS 02/21/2007   Anxiety 02/15/2007   CAD (coronary artery disease) 02/15/2007   DIVERTICULOSIS OF COLON 02/15/2007   COLONIC POLYPS 02/12/2007   Hypertension associated with diabetes (HCC) 02/12/2007   RENAL CALCULUS 02/12/2007   Osteoarthritis 02/12/2007    Orientation RESPIRATION BLADDER Height & Weight     Self, Time, Situation, Place  Normal Continent Weight: 222 lb (100.7 kg) Height:  5' 11 (180.3 cm)  BEHAVIORAL SYMPTOMS/MOOD NEUROLOGICAL BOWEL NUTRITION STATUS      Continent Diet (Regular)  AMBULATORY STATUS COMMUNICATION OF NEEDS Skin   Extensive Assist Verbally Surgical wounds                       Personal Care Assistance Level of Assistance  Bathing, Dressing, Feeding Bathing Assistance: Limited assistance Feeding assistance: Independent Dressing Assistance: Limited assistance     Functional Limitations Info  Sight, Hearing, Speech Sight Info: Impaired Hearing Info: Impaired Speech Info: Adequate    SPECIAL CARE FACTORS FREQUENCY  PT (By licensed PT), OT (By licensed OT)     PT Frequency: 5x's/week OT Frequency: 5x's/week            Contractures Contractures Info: Not present    Additional Factors Info  Code Status, Allergies Code  Status Info: Full Allergies Info: Morphine , Atorvastatin , Codeine Phosphate, Metformin  And Related           Current Medications (11/20/2023):  This is the current hospital active medication list Current Facility-Administered Medications  Medication Dose Route Frequency Provider Last Rate Last Admin   acetaminophen  (TYLENOL ) tablet 650 mg  650 mg Oral Q6H  PRN Sherida Adine BROCKS, MD   650 mg at 11/20/23 1039   Or   acetaminophen  (TYLENOL ) suppository 650 mg  650 mg Rectal Q6H PRN Sherida Adine BROCKS, MD       amLODipine  (NORVASC ) tablet 5 mg  5 mg Oral Daily Sherida Adine BROCKS, MD   5 mg at 11/20/23 0900   aspirin  EC tablet 81 mg  81 mg Oral BID Wham, Bradley C, MD   81 mg at 11/20/23 0900   carvedilol  (COREG ) tablet 12.5 mg  12.5 mg Oral BID WC Sherida Adine BROCKS, MD   12.5 mg at 11/20/23 0900   cyanocobalamin  (VITAMIN B12) tablet 1,000 mcg  1,000 mcg Oral Daily Wham, Bradley C, MD   1,000 mcg at 11/20/23 0900   escitalopram  (LEXAPRO ) tablet 20 mg  20 mg Oral Daily Sherida Adine BROCKS, MD   20 mg at 11/20/23 0900   hydrALAZINE  (APRESOLINE ) tablet 50 mg  50 mg Oral TID Sherida Adine BROCKS, MD   50 mg at 11/20/23 0900   HYDROmorphone  (DILAUDID ) injection 0.5 mg  0.5 mg Intravenous Q2H PRN Sherida Adine BROCKS, MD   0.5 mg at 11/20/23 0404   insulin  aspart (novoLOG ) injection 0-20 Units  0-20 Units Subcutaneous TID WC Sherida Adine BROCKS, MD   7 Units at 11/20/23 9189   insulin  glargine (LANTUS ) injection 8 Units  8 Units Subcutaneous QHS Ghimire, Kuber, MD   8 Units at 11/19/23 2245   meclizine  (ANTIVERT ) tablet 12.5 mg  12.5 mg Oral TID PRN Wham, Bradley C, MD       multivitamin with minerals tablet 1 tablet  1 tablet Oral Daily Sherida Adine BROCKS, MD   1 tablet at 11/20/23 0900   ondansetron  (ZOFRAN ) tablet 4 mg  4 mg Oral Q6H PRN Sherida Adine BROCKS, MD       Or   ondansetron  (ZOFRAN ) injection 4 mg  4 mg Intravenous Q6H PRN Sherida Adine BROCKS, MD   4 mg at 11/18/23 1128   oxyCODONE  (Oxy IR/ROXICODONE ) immediate release tablet 5 mg  5 mg Oral Q4H PRN Sherida Adine BROCKS, MD   5 mg at 11/20/23 1039   polyethylene glycol (MIRALAX  / GLYCOLAX ) packet 17 g  17 g Oral BID Sherida Adine BROCKS, MD   17 g at 11/20/23 0901   protein supplement (ENSURE MAX) liquid  11 oz Oral BID Sherida Adine BROCKS, MD   11 oz at 11/20/23 0901   senna-docusate (Senokot-S) tablet 1 tablet  1 tablet Oral BID Sherida Adine BROCKS,  MD   1 tablet at 11/20/23 0900   sorbitol, magnesium  hydroxide, mineral oil, glycerin (SMOG) enema  960 mL Rectal Once Regalado, Belkys A, MD       tamsulosin  (FLOMAX ) capsule 0.4 mg  0.4 mg Oral Daily Sherida Adine BROCKS, MD   0.4 mg at 11/20/23 0900     Discharge Medications: Please see discharge summary for a list of discharge medications.  Relevant Imaging Results:  Relevant Lab Results:   Additional Information SSN: [id number]  Lorraine LILLETTE Fenton, LCSW

## 2023-11-21 DIAGNOSIS — E66811 Obesity, class 1: Secondary | ICD-10-CM

## 2023-11-21 DIAGNOSIS — I2510 Atherosclerotic heart disease of native coronary artery without angina pectoris: Secondary | ICD-10-CM

## 2023-11-21 DIAGNOSIS — N401 Enlarged prostate with lower urinary tract symptoms: Secondary | ICD-10-CM

## 2023-11-21 DIAGNOSIS — I152 Hypertension secondary to endocrine disorders: Secondary | ICD-10-CM

## 2023-11-21 DIAGNOSIS — S3011XA Contusion of abdominal wall, initial encounter: Secondary | ICD-10-CM

## 2023-11-21 DIAGNOSIS — E876 Hypokalemia: Secondary | ICD-10-CM | POA: Diagnosis not present

## 2023-11-21 DIAGNOSIS — N138 Other obstructive and reflux uropathy: Secondary | ICD-10-CM

## 2023-11-21 DIAGNOSIS — D62 Acute posthemorrhagic anemia: Secondary | ICD-10-CM

## 2023-11-21 DIAGNOSIS — I5042 Chronic combined systolic (congestive) and diastolic (congestive) heart failure: Secondary | ICD-10-CM | POA: Diagnosis not present

## 2023-11-21 DIAGNOSIS — S72002A Fracture of unspecified part of neck of left femur, initial encounter for closed fracture: Secondary | ICD-10-CM | POA: Diagnosis not present

## 2023-11-21 DIAGNOSIS — T148XXA Other injury of unspecified body region, initial encounter: Secondary | ICD-10-CM

## 2023-11-21 DIAGNOSIS — R7989 Other specified abnormal findings of blood chemistry: Secondary | ICD-10-CM

## 2023-11-21 DIAGNOSIS — E1159 Type 2 diabetes mellitus with other circulatory complications: Secondary | ICD-10-CM | POA: Diagnosis not present

## 2023-11-21 DIAGNOSIS — N179 Acute kidney failure, unspecified: Secondary | ICD-10-CM

## 2023-11-21 LAB — GLUCOSE, CAPILLARY
Glucose-Capillary: 203 mg/dL — ABNORMAL HIGH (ref 70–99)
Glucose-Capillary: 209 mg/dL — ABNORMAL HIGH (ref 70–99)
Glucose-Capillary: 154 mg/dL — ABNORMAL HIGH (ref 70–99)
Glucose-Capillary: 298 mg/dL — ABNORMAL HIGH (ref 70–99)

## 2023-11-21 LAB — RETICULOCYTES
Immature Retic Fract: 28 % — ABNORMAL HIGH (ref 2.3–15.9)
RBC.: 3.27 MIL/uL — ABNORMAL LOW (ref 4.22–5.81)
Retic Count, Absolute: 98.1 K/uL (ref 19.0–186.0)
Retic Ct Pct: 3 % (ref 0.4–3.1)

## 2023-11-21 LAB — CBC
HCT: 31.2 % — ABNORMAL LOW (ref 39.0–52.0)
Hemoglobin: 9.8 g/dL — ABNORMAL LOW (ref 13.0–17.0)
MCH: 29.9 pg (ref 26.0–34.0)
MCHC: 31.4 g/dL (ref 30.0–36.0)
MCV: 95.1 fL (ref 80.0–100.0)
Platelets: 310 K/uL (ref 150–400)
RBC: 3.28 MIL/uL — ABNORMAL LOW (ref 4.22–5.81)
RDW: 13.6 % (ref 11.5–15.5)
WBC: 7.8 K/uL (ref 4.0–10.5)
nRBC: 0 % (ref 0.0–0.2)

## 2023-11-21 LAB — RENAL FUNCTION PANEL
Albumin: 3.1 g/dL — ABNORMAL LOW (ref 3.5–5.0)
Anion gap: 10 (ref 5–15)
BUN: 29 mg/dL — ABNORMAL HIGH (ref 8–23)
CO2: 24 mmol/L (ref 22–32)
Calcium: 9.5 mg/dL (ref 8.9–10.3)
Chloride: 100 mmol/L (ref 98–111)
Creatinine, Ser: 1.09 mg/dL (ref 0.61–1.24)
GFR, Estimated: >60 mL/min (ref >60)
Glucose, Bld: 248 mg/dL — ABNORMAL HIGH (ref 70–99)
Phosphorus: 2.7 mg/dL (ref 2.5–4.6)
Potassium: 4 mmol/L (ref 3.5–5.1)
Sodium: 134 mmol/L — ABNORMAL LOW (ref 135–145)

## 2023-11-21 LAB — IRON AND TIBC
Iron: 30 ug/dL — ABNORMAL LOW (ref 45–182)
Saturation Ratios: 13 % — ABNORMAL LOW (ref 17.9–39.5)
TIBC: 238 ug/dL — ABNORMAL LOW (ref 250–450)
UIBC: 208 ug/dL

## 2023-11-21 LAB — FERRITIN: Ferritin: 86 ng/mL (ref 24–336)

## 2023-11-21 LAB — MAGNESIUM: Magnesium: 2.2 mg/dL (ref 1.7–2.4)

## 2023-11-21 LAB — PROTIME-INR
INR: 1 (ref 0.8–1.2)
Prothrombin Time: 13.3 s (ref 11.4–15.2)

## 2023-11-21 LAB — APTT: aPTT: 32 s (ref 24–36)

## 2023-11-21 LAB — FOLATE: Folate: 11.8 ng/mL (ref >5.9)

## 2023-11-21 LAB — VITAMIN B12: Vitamin B-12: 559 pg/mL (ref 180–914)

## 2023-11-21 MED ORDER — AMLODIPINE BESYLATE 10 MG PO TABS
10.0000 mg | ORAL_TABLET | Freq: Every day | ORAL | Status: DC
  Administered 2023-11-21 – 2023-11-24 (×4): 10 mg via ORAL
  Filled 2023-11-21 (×4): qty 1

## 2023-11-21 MED ORDER — INSULIN GLARGINE 100 UNIT/ML ~~LOC~~ SOLN
15.0000 [IU] | Freq: Every day | SUBCUTANEOUS | Status: DC
  Administered 2023-11-21 – 2023-11-22 (×2): 15 [IU] via SUBCUTANEOUS
  Filled 2023-11-21 (×3): qty 0.15

## 2023-11-21 MED ORDER — INSULIN ASPART 100 UNIT/ML IJ SOLN
4.0000 [IU] | Freq: Three times a day (TID) | INTRAMUSCULAR | Status: DC
  Administered 2023-11-22 – 2023-11-24 (×8): 4 [IU] via SUBCUTANEOUS

## 2023-11-21 NOTE — Progress Notes (Signed)
 PROGRESS NOTE    [name]  FMW:[id number] DOB: 08-22-1934 DOA: 11/16/2023 PCP: [name], MD    Brief Narrative:  89 year old with history of anxiety, coronary artery disease, hyperlipidemia, type 2 diabetes on insulin , nephrolithiasis, BPH fell at home on his left hip.  Found to have intertrochanteric fracture left hip.  CT scan abdomen pelvis positive for 9.2 cm exophytic right upper lobe renal mass and also significant constipation.  Also nondisplaced fracture of the left side 9th and 10th rib.  Underwent IM nailing 10/7.  Therapy recommended SNF.   Subjective: No major events overnight or this morning.  Reports significant pain in his left chest from rib fractures.  He rates his pain 9/10 although he does not appear to be in much distress.   Assessment & Plan: Closed traumatic intertrochanteric fracture of the left hip. -ORIF IM nailing 10/7-Dr. Sherida -Weightbearing as tolerated -Pain management. Bowel regimen.  -SCD for VTE prophylaxis.  Holding aspirin  due to hematoma -Outpatient follow-up with orthopedics.   -Work with PT OT and referred to a SNF for rehab.  ABLA due to large abdominal wall hematoma: CT on 10/10 showed 3.4 x 6.5 x 6.5 cm intramuscular hematoma along the left mid back and additional 1.6 x 3.0 cm hematoma over left gluteal region. Hgb dropped about 5 g since admission.  No report of hematoma on initial CT on 10/7.  Significant left flank hematoma on exam.   Recent Labs    04/11/23 0710 04/13/23 0953 06/08/23 1428 06/08/23 1443 06/09/23 0742 08/06/23 0125 11/16/23 0820 11/17/23 0546 11/18/23 0602 11/20/23 0648  HGB 13.0 16.0 13.2 13.6 13.5 12.9* 14.6 11.4* 9.8* 9.7*  - Hold aspirin .  SCD for VTE prophylaxis - Check CBC, PTT/INR/PTT and anemia panel - Will consult IR if H&H continues to trend down  Nondisplaced fracture of the left side 9th and 10th rib. - Incentive spirometry and pain control.  Right renal mass: Incidental finding of  right renal mass on CT scan.  Concerning for malignancy. -Patient and his son updated by previous provider -Message was sent to Dr. Renda for follow-up by previous provider.  Severe constipation: Resolved with bowel regimen.  LBM 10/10. -Continue bowel regimen  IDDM-2 with hyperglycemia: A1c 8.8% on 8/19. Recent Labs  Lab 11/20/23 1202 11/20/23 1640 11/20/23 2318 11/21/23 0740 11/21/23 1151  GLUCAP 261* 160* 273* 203* 209*  - Increase basal insulin  from 13 to 15 units at bedtime -Add NovoLog  4 units 3 times daily with meals starting this evening -Continue SSI-resistant scale -Further adjustment as appropriate.  Essential hypertension: SBP in 160s. -Continue Coreg  and hydralazine . -Increase amlodipine  -Further adjustment as appropriate - Pain control   BPH:  -continue with  Flomax .  AKI: Resolved.  Class I obesity Body mass index is 30.96 kg/m.     DVT prophylaxis: SCDs Start: 11/16/23 1147   Code Status: Full code Family Communication: None at bedside. Disposition Plan: Status is: Inpatient Remains inpatient appropriate because: ABLA due to hematoma     Consultants:  Orthopedics  Procedures:  Left hip ORIF  Antimicrobials:  Perioperative     Objective: Vitals:   11/20/23 1200 11/20/23 2041 11/21/23 0624 11/21/23 1153  BP: (!) 178/69 (!) 165/76 (!) 162/89 (!) 155/69  Pulse: 82 76 80 75  Resp: 20 18 19 20   Temp: 98.3 F (36.8 C) 98.5 F (36.9 C) 98.2 F (36.8 C) 98.4 F (36.9 C)  TempSrc:   Oral   SpO2: 96% 97% 97% 96%  Weight:  Height:        Intake/Output Summary (Last 24 hours) at 11/21/2023 1228 Last data filed at 11/21/2023 1141 Gross per 24 hour  Intake 600 ml  Output 3050 ml  Net -2450 ml   Filed Weights   11/17/23 0949 11/17/23 1014  Weight: 100.7 kg 100.7 kg    Examination:  GENERAL: No apparent distress.  Nontoxic. HEENT: MMM.  Vision and hearing grossly intact.  NECK: Supple.  No apparent JVD.  RESP:  No  IWOB.  Fair aeration bilaterally. CVS:  RRR. Heart sounds normal.  ABD/GI/GU: BS+. Abd soft, NTND.  MSK/EXT:   No apparent deformity. Moves extremities. No edema.  SKIN: Large left buttock/flank hematoma.  Some staining on surgical wound dressing. NEURO: Awake and alert. Oriented appropriately.  No apparent focal neuro deficit. PSYCH: Calm. Normal affect.    Data Reviewed: I have personally reviewed following labs and imaging studies  CBC: Recent Labs  Lab 11/16/23 0820 11/17/23 0546 11/18/23 0602 11/20/23 0648  WBC 14.2* 12.2* 12.4* 8.9  NEUTROABS 10.8*  --   --   --   HGB 14.6 11.4* 9.8* 9.7*  HCT 44.3 35.9* 30.7* 30.3*  MCV 92.1 94.2 97.2 95.6  PLT 307 250 221 252   Basic Metabolic Panel: Recent Labs  Lab 11/16/23 0820 11/17/23 0546 11/18/23 0602 11/20/23 0648  NA 134* 137 138 134*  K 3.4* 3.6 3.7 4.0  CL 101 102 104 101  CO2 17* 23 23 25   GLUCOSE 380* 223* 214* 237*  BUN 42* 43* 39* 39*  CREATININE 1.53* 1.62* 1.61* 1.20  CALCIUM  9.8 9.4 9.3 9.3   GFR: Estimated Creatinine Clearance: 53.4 mL/min (by C-G formula based on SCr of 1.2 mg/dL). Liver Function Tests: Recent Labs  Lab 11/17/23 0546  AST 16  ALT 14  ALKPHOS 58  BILITOT 0.5  PROT 5.6*  ALBUMIN 3.6   No results for input(s): LIPASE, AMYLASE in the last 168 hours. No results for input(s): AMMONIA in the last 168 hours. Coagulation Profile: No results for input(s): INR, PROTIME in the last 168 hours. Cardiac Enzymes: No results for input(s): CKTOTAL, CKMB, CKMBINDEX, TROPONINI in the last 168 hours. BNP (last 3 results) No results for input(s): PROBNP in the last 8760 hours. HbA1C: No results for input(s): HGBA1C in the last 72 hours. CBG: Recent Labs  Lab 11/20/23 1202 11/20/23 1640 11/20/23 2318 11/21/23 0740 11/21/23 1151  GLUCAP 261* 160* 273* 203* 209*   Lipid Profile: No results for input(s): CHOL, HDL, LDLCALC, TRIG, CHOLHDL, LDLDIRECT in the  last 72 hours. Thyroid  Function Tests: No results for input(s): TSH, T4TOTAL, FREET4, T3FREE, THYROIDAB in the last 72 hours. Anemia Panel: No results for input(s): VITAMINB12, FOLATE, FERRITIN, TIBC, IRON, RETICCTPCT in the last 72 hours. Sepsis Labs: No results for input(s): PROCALCITON, LATICACIDVEN in the last 168 hours.  No results found for this or any previous visit (from the past 240 hours).       Radiology Studies: CT ABDOMEN PELVIS WO CONTRAST Result Date: 11/20/2023 CLINICAL DATA:  Retroperitoneal bleed suspected. EXAM: CT ABDOMEN AND PELVIS WITHOUT CONTRAST TECHNIQUE: Multidetector CT imaging of the abdomen and pelvis was performed following the standard protocol without IV contrast. RADIATION DOSE REDUCTION: This exam was performed according to the departmental dose-optimization program which includes automated exposure control, adjustment of the mA and/or kV according to patient size and/or use of iterative reconstruction technique. COMPARISON:  CT scan chest, abdomen and pelvis from 11/16/2023. FINDINGS: Lower chest: There are bilateral small pleural  effusions with associated compressive atelectatic changes in the bilateral lower lobes. No suspicious mass. Top-normal heart size. No pericardial effusion. There are coronary artery atherosclerotic calcifications, in keeping with coronary artery disease. Hepatobiliary: The liver is normal in size. Non-cirrhotic configuration. No suspicious mass. No intrahepatic or extrahepatic bile duct dilation. Gallbladder is surgically absent. Pancreas: Unremarkable. No pancreatic ductal dilatation or surrounding inflammatory changes. Spleen: Within normal limits. No focal lesion. Adrenals/Urinary Tract: Adrenal glands are unremarkable. No suspicious renal mass within the limitations of this unenhanced exam. There are multiple bilateral simple renal cysts with largest arising from the right kidney upper pole, posteriorly  measuring up to 6.7 x 8.8 cm. There are several mildly hyperattenuating structures which can not be characterized as a simple cyst. For example, there is a 5.0 x 5.1 cm structure arising from the left kidney upper pole, anteriorly with internal CT attenuation of 24 Hounsfield units. This may represent a proteinaceous/hemorrhagic cyst however, correlation with multiphasic contrast-enhanced MRI abdomen as per renal mass protocol is recommended. There are at least 3, 1-2 mm size nonobstructing calculi in bilateral kidneys. No hydroureteronephrosis on either side. Urinary bladder is partially distended and appears within normal limits. Stomach/Bowel: No disproportionate dilation of the small or large bowel loops. No evidence of abnormal bowel wall thickening or inflammatory changes. The appendix was not visualized; however there is no acute inflammatory process in the right lower quadrant. There are multiple diverticula mainly in the left hemi colon, without imaging signs of diverticulitis. Vascular/Lymphatic: No ascites or pneumoperitoneum. No abdominal or pelvic lymphadenopathy, by size criteria. No aneurysmal dilation of the major abdominal arteries. There are moderate peripheral atherosclerotic vascular calcifications of the aorta and its major branches. Reproductive: Markedly enlarged prostate gland. There is also asymmetrically enlarged left seminal vesicle in comparison to the right. Correlate clinically and with serum PSA levels to determine the need for additional imaging with MRI prostate. Other: Infraumbilical midline surgical scar noted. There is mild anasarca. There is a 3.4 x 6.5 x 6.5 cm intramuscular hematoma along the left mid back region. There is mildly displaced fracture of the overlying left tenth rib, posteriorly. There is also an additional subcutaneous 1.6 x 3.0 cm hematoma over the left gluteal region. No retroperitoneal hematoma noted. There bilateral small fat containing inguinal hernias.  Musculoskeletal: No suspicious osseous lesions. There are mild - moderate multilevel degenerative changes in the visualized spine. Metallic hardware noted in the left proximal femur, from prior intertrochanteric fracture repair. IMPRESSION: 1. There is a 3.4 x 6.5 x 6.5 cm intramuscular hematoma along the left mid back region with mildly displaced fracture of the overlying left tenth rib, posteriorly. There is also an additional subcutaneous 1.6 x 3.0 cm hematoma over the left gluteal region. No retroperitoneal hematoma noted. 2. Multiple bilateral simple and proteinaceous/hemorrhagic cysts. Bilateral vertebral mm sized nonobstructing renal calculi. No hydroureteronephrosis or ureterolithiasis on either side. 3. Multiple other nonacute observations, as described above. Aortic Atherosclerosis (ICD10-I70.0). Electronically Signed   By: Ree Molt M.D.   On: 11/20/2023 17:41         Scheduled Meds:  amLODipine   10 mg Oral Daily   aspirin  EC  81 mg Oral BID   carvedilol   12.5 mg Oral BID WC   cyanocobalamin   1,000 mcg Oral Daily   escitalopram   20 mg Oral Daily   hydrALAZINE   50 mg Oral TID   insulin  aspart  0-20 Units Subcutaneous TID WC   insulin  glargine  13 Units Subcutaneous QHS   multivitamin  with minerals  1 tablet Oral Daily   polyethylene glycol  17 g Oral BID   Ensure Max Protein  11 oz Oral BID   senna-docusate  1 tablet Oral BID   tamsulosin   0.4 mg Oral Daily   Continuous Infusions:     LOS: 5 days    Time spent: 51 minutes    Mignon ONEIDA Bump, MD [facility]

## 2023-11-21 NOTE — Plan of Care (Signed)
  Problem: Education: Goal: Ability to describe self-care measures that may prevent or decrease complications (Diabetes Survival Skills Education) will improve Outcome: Progressing   Problem: Coping: Goal: Ability to adjust to condition or change in health will improve Outcome: Progressing   Problem: Fluid Volume: Goal: Ability to maintain a balanced intake and output will improve Outcome: Progressing   Problem: Health Behavior/Discharge Planning: Goal: Ability to identify and utilize available resources and services will improve Outcome: Progressing Goal: Ability to manage health-related needs will improve Outcome: Progressing   Problem: Metabolic: Goal: Ability to maintain appropriate glucose levels will improve Outcome: Progressing   Problem: Nutritional: Goal: Maintenance of adequate nutrition will improve Outcome: Progressing Goal: Progress toward achieving an optimal weight will improve Outcome: Progressing   Problem: Skin Integrity: Goal: Risk for impaired skin integrity will decrease Outcome: Progressing   Problem: Tissue Perfusion: Goal: Adequacy of tissue perfusion will improve Outcome: Progressing   Problem: Health Behavior/Discharge Planning: Goal: Ability to manage health-related needs will improve Outcome: Progressing   Problem: Clinical Measurements: Goal: Ability to maintain clinical measurements within normal limits will improve Outcome: Progressing Goal: Will remain free from infection Outcome: Progressing Goal: Diagnostic test results will improve Outcome: Progressing Goal: Respiratory complications will improve Outcome: Progressing Goal: Cardiovascular complication will be avoided Outcome: Progressing   Problem: Activity: Goal: Risk for activity intolerance will decrease Outcome: Progressing   Problem: Nutrition: Goal: Adequate nutrition will be maintained Outcome: Progressing   Problem: Coping: Goal: Level of anxiety will  decrease Outcome: Progressing   Problem: Elimination: Goal: Will not experience complications related to bowel motility Outcome: Progressing Goal: Will not experience complications related to urinary retention Outcome: Progressing   Problem: Pain Managment: Goal: General experience of comfort will improve and/or be controlled Outcome: Progressing   Problem: Safety: Goal: Ability to remain free from injury will improve Outcome: Progressing   Problem: Skin Integrity: Goal: Risk for impaired skin integrity will decrease Outcome: Progressing

## 2023-11-21 NOTE — Plan of Care (Signed)
   Problem: Education: Goal: Ability to describe self-care measures that may prevent or decrease complications (Diabetes Survival Skills Education) will improve Outcome: Progressing   Problem: Coping: Goal: Ability to adjust to condition or change in health will improve Outcome: Progressing   Problem: Fluid Volume: Goal: Ability to maintain a balanced intake and output will improve Outcome: Progressing

## 2023-11-22 DIAGNOSIS — S72002A Fracture of unspecified part of neck of left femur, initial encounter for closed fracture: Secondary | ICD-10-CM | POA: Diagnosis not present

## 2023-11-22 LAB — RENAL FUNCTION PANEL
Albumin: 3.3 g/dL — ABNORMAL LOW (ref 3.5–5.0)
Anion gap: 11 (ref 5–15)
BUN: 26 mg/dL — ABNORMAL HIGH (ref 8–23)
CO2: 24 mmol/L (ref 22–32)
Calcium: 9.6 mg/dL (ref 8.9–10.3)
Chloride: 100 mmol/L (ref 98–111)
Creatinine, Ser: 1.02 mg/dL (ref 0.61–1.24)
GFR, Estimated: >60 mL/min (ref >60)
Glucose, Bld: 235 mg/dL — ABNORMAL HIGH (ref 70–99)
Phosphorus: 2.7 mg/dL (ref 2.5–4.6)
Potassium: 4.6 mmol/L (ref 3.5–5.1)
Sodium: 134 mmol/L — ABNORMAL LOW (ref 135–145)

## 2023-11-22 LAB — GLUCOSE, CAPILLARY
Glucose-Capillary: 224 mg/dL — ABNORMAL HIGH (ref 70–99)
Glucose-Capillary: 257 mg/dL — ABNORMAL HIGH (ref 70–99)
Glucose-Capillary: 155 mg/dL — ABNORMAL HIGH (ref 70–99)
Glucose-Capillary: 150 mg/dL — ABNORMAL HIGH (ref 70–99)

## 2023-11-22 LAB — CBC
HCT: 31.7 % — ABNORMAL LOW (ref 39.0–52.0)
Hemoglobin: 10.3 g/dL — ABNORMAL LOW (ref 13.0–17.0)
MCH: 30.7 pg (ref 26.0–34.0)
MCHC: 32.5 g/dL (ref 30.0–36.0)
MCV: 94.6 fL (ref 80.0–100.0)
Platelets: 347 K/uL (ref 150–400)
RBC: 3.35 MIL/uL — ABNORMAL LOW (ref 4.22–5.81)
RDW: 13.5 % (ref 11.5–15.5)
WBC: 9.4 K/uL (ref 4.0–10.5)
nRBC: 0 % (ref 0.0–0.2)

## 2023-11-22 LAB — MAGNESIUM: Magnesium: 2 mg/dL (ref 1.7–2.4)

## 2023-11-22 MED ORDER — METHOCARBAMOL 500 MG PO TABS
250.0000 mg | ORAL_TABLET | Freq: Four times a day (QID) | ORAL | Status: DC
  Administered 2023-11-22 – 2023-11-24 (×10): 250 mg via ORAL
  Filled 2023-11-22 (×10): qty 1

## 2023-11-22 NOTE — Progress Notes (Signed)
 PROGRESS NOTE    [name]  FMW:[id number] DOB: 1934-03-23 DOA: 11/16/2023 PCP: [name], MD    Brief Narrative:  89 year old with history of anxiety, coronary artery disease, hyperlipidemia, type 2 diabetes on insulin , nephrolithiasis, BPH fell at home on his left hip.  Found to have intertrochanteric fracture left hip.  CT scan abdomen pelvis positive for 9.2 cm exophytic right upper lobe renal mass and also significant constipation.  Also nondisplaced fracture of the left side 9th and 10th rib.  Underwent IM nailing 10/7.  Therapy recommended SNF.   Subjective:  Patient seen and examined at the bedside.  He was on a commode.  He reported ongoing pain.  He reports that his doctor told him not to take Aleve  or Tylenol .  He does have extensive subcutaneous hematoma in the left flank.  Vital signs are stable.  Hemoglobin remains stable at 10.3.  Assessment & Plan: Closed traumatic intertrochanteric fracture of the left hip. -ORIF IM nailing 10/7-Dr. Sherida -Weightbearing as tolerated -Pain management. Bowel regimen.  -SCD for VTE prophylaxis.  Holding aspirin  due to hematoma -Outpatient follow-up with orthopedics.   -Work with PT OT and referred to a SNF for rehab.  ABLA due to large abdominal wall hematoma: CT on 10/10 showed 3.4 x 6.5 x 6.5 cm intramuscular hematoma along the left mid back and additional 1.6 x 3.0 cm hematoma over left gluteal region. Hgb dropped about 5 g since admission.  No report of hematoma on initial CT on 10/7.  Significant left flank hematoma on exam.   Recent Labs    06/08/23 1428 06/08/23 1443 06/09/23 0742 08/06/23 0125 11/16/23 0820 11/17/23 0546 11/18/23 0602 11/20/23 0648 11/21/23 1248 11/22/23 0729  HGB 13.2 13.6 13.5 12.9* 14.6 11.4* 9.8* 9.7* 9.8* 10.3*  - Hold aspirin .  SCD for VTE prophylaxis - Hemoglobin has stabilized.  Between 9 to10 in past few days  Nondisplaced fracture of the left side 9th and 10th rib. - Incentive  spirometry and pain control. - Add Robaxin .  Continue oxycodone  as needed  Right renal mass: Incidental finding of right renal mass on CT scan.  Concerning for malignancy. -Patient and his son updated by previous provider -Message was sent to Dr. Renda for follow-up by previous provider.  Severe constipation: Resolved with bowel regimen.  LBM 10/10. -Continue bowel regimen  IDDM-2 with hyperglycemia: A1c 8.8% on 8/19. Recent Labs  Lab 11/21/23 0740 11/21/23 1151 11/21/23 1659 11/21/23 2124 11/22/23 0744  GLUCAP 203* 209* 154* 298* 224*  - I basal insulin  15 units at bedtime -continue  NovoLog  4 units 3 times daily with meals -Continue SSI-resistant scale -Further adjustment as appropriate.  Essential hypertension: SBP in 160s. -Continue Coreg  and hydralazine . -Increase amlodipine  -Further adjustment as appropriate - Pain control   BPH:  -continue with  Flomax .  AKI: Resolved.  Class I obesity Body mass index is 30.96 kg/m.     DVT prophylaxis: Place and maintain sequential compression device Start: 11/21/23 1232 SCDs Start: 11/16/23 1147   Code Status: Full code Family Communication: None at bedside. Disposition Plan: Status is: Inpatient Remains inpatient appropriate because: ABLA due to hematoma     Consultants:  Orthopedics  Procedures:  Left hip ORIF  Antimicrobials:  Perioperative     Objective: Vitals:   11/21/23 0624 11/21/23 1153 11/21/23 2033 11/22/23 0451  BP: (!) 162/89 (!) 155/69 (!) 152/65 (!) 158/80  Pulse: 80 75 78 80  Resp: 19 20 20 20   Temp: 98.2 F (36.8 C)  98.4 F (36.9 C) 98.1 F (36.7 C) 97.7 F (36.5 C)  TempSrc: Oral  Oral Oral  SpO2: 97% 96% 97% 98%  Weight:      Height:        Intake/Output Summary (Last 24 hours) at 11/22/2023 0845 Last data filed at 11/22/2023 0500 Gross per 24 hour  Intake 480 ml  Output 3600 ml  Net -3120 ml   Filed Weights   11/17/23 0949 11/17/23 1014  Weight: 100.7 kg 100.7 kg     Examination:  GENERAL: No apparent distress.  Nontoxic. HEENT: MMM.  Vision and hearing grossly intact.  NECK: Supple.  No apparent JVD.  RESP:  No IWOB.  Fair aeration bilaterally. CVS:  RRR. Heart sounds normal.  ABD/GI/GU: BS+. Abd soft, NTND.  MSK/EXT:   No apparent deformity. Moves extremities. No edema.  SKIN: Large left buttock/flank hematoma.  Some staining on surgical wound dressing. NEURO: Awake and alert. Oriented appropriately.  No apparent focal neuro deficit. PSYCH: Calm. Normal affect.    Data Reviewed: I have personally reviewed following labs and imaging studies  CBC: Recent Labs  Lab 11/16/23 0820 11/17/23 0546 11/18/23 0602 11/20/23 0648 11/21/23 1248 11/22/23 0729  WBC 14.2* 12.2* 12.4* 8.9 7.8 9.4  NEUTROABS 10.8*  --   --   --   --   --   HGB 14.6 11.4* 9.8* 9.7* 9.8* 10.3*  HCT 44.3 35.9* 30.7* 30.3* 31.2* 31.7*  MCV 92.1 94.2 97.2 95.6 95.1 94.6  PLT 307 250 221 252 310 347   Basic Metabolic Panel: Recent Labs  Lab 11/17/23 0546 11/18/23 0602 11/20/23 0648 11/21/23 1248 11/22/23 0639  NA 137 138 134* 134* 134*  K 3.6 3.7 4.0 4.0 4.6  CL 102 104 101 100 100  CO2 23 23 25 24 24   GLUCOSE 223* 214* 237* 248* 235*  BUN 43* 39* 39* 29* 26*  CREATININE 1.62* 1.61* 1.20 1.09 1.02  CALCIUM  9.4 9.3 9.3 9.5 9.6  MG  --   --   --  2.2 2.0  PHOS  --   --   --  2.7 2.7   GFR: Estimated Creatinine Clearance: 62.9 mL/min (by C-G formula based on SCr of 1.02 mg/dL). Liver Function Tests: Recent Labs  Lab 11/17/23 0546 11/21/23 1248 11/22/23 0639  AST 16  --   --   ALT 14  --   --   ALKPHOS 58  --   --   BILITOT 0.5  --   --   PROT 5.6*  --   --   ALBUMIN 3.6 3.1* 3.3*   No results for input(s): LIPASE, AMYLASE in the last 168 hours. No results for input(s): AMMONIA in the last 168 hours. Coagulation Profile: Recent Labs  Lab 11/21/23 1248  INR 1.0   Cardiac Enzymes: No results for input(s): CKTOTAL, CKMB, CKMBINDEX,  TROPONINI in the last 168 hours. BNP (last 3 results) No results for input(s): PROBNP in the last 8760 hours. HbA1C: No results for input(s): HGBA1C in the last 72 hours. CBG: Recent Labs  Lab 11/21/23 0740 11/21/23 1151 11/21/23 1659 11/21/23 2124 11/22/23 0744  GLUCAP 203* 209* 154* 298* 224*   Lipid Profile: No results for input(s): CHOL, HDL, LDLCALC, TRIG, CHOLHDL, LDLDIRECT in the last 72 hours. Thyroid  Function Tests: No results for input(s): TSH, T4TOTAL, FREET4, T3FREE, THYROIDAB in the last 72 hours. Anemia Panel: Recent Labs    11/21/23 1248  VITAMINB12 559  FOLATE 11.8  FERRITIN 86  TIBC 238*  IRON 30*  RETICCTPCT 3.0   Sepsis Labs: No results for input(s): PROCALCITON, LATICACIDVEN in the last 168 hours.  No results found for this or any previous visit (from the past 240 hours).       Radiology Studies: CT ABDOMEN PELVIS WO CONTRAST Result Date: 11/20/2023 CLINICAL DATA:  Retroperitoneal bleed suspected. EXAM: CT ABDOMEN AND PELVIS WITHOUT CONTRAST TECHNIQUE: Multidetector CT imaging of the abdomen and pelvis was performed following the standard protocol without IV contrast. RADIATION DOSE REDUCTION: This exam was performed according to the departmental dose-optimization program which includes automated exposure control, adjustment of the mA and/or kV according to patient size and/or use of iterative reconstruction technique. COMPARISON:  CT scan chest, abdomen and pelvis from 11/16/2023. FINDINGS: Lower chest: There are bilateral small pleural effusions with associated compressive atelectatic changes in the bilateral lower lobes. No suspicious mass. Top-normal heart size. No pericardial effusion. There are coronary artery atherosclerotic calcifications, in keeping with coronary artery disease. Hepatobiliary: The liver is normal in size. Non-cirrhotic configuration. No suspicious mass. No intrahepatic or extrahepatic bile duct  dilation. Gallbladder is surgically absent. Pancreas: Unremarkable. No pancreatic ductal dilatation or surrounding inflammatory changes. Spleen: Within normal limits. No focal lesion. Adrenals/Urinary Tract: Adrenal glands are unremarkable. No suspicious renal mass within the limitations of this unenhanced exam. There are multiple bilateral simple renal cysts with largest arising from the right kidney upper pole, posteriorly measuring up to 6.7 x 8.8 cm. There are several mildly hyperattenuating structures which can not be characterized as a simple cyst. For example, there is a 5.0 x 5.1 cm structure arising from the left kidney upper pole, anteriorly with internal CT attenuation of 24 Hounsfield units. This may represent a proteinaceous/hemorrhagic cyst however, correlation with multiphasic contrast-enhanced MRI abdomen as per renal mass protocol is recommended. There are at least 3, 1-2 mm size nonobstructing calculi in bilateral kidneys. No hydroureteronephrosis on either side. Urinary bladder is partially distended and appears within normal limits. Stomach/Bowel: No disproportionate dilation of the small or large bowel loops. No evidence of abnormal bowel wall thickening or inflammatory changes. The appendix was not visualized; however there is no acute inflammatory process in the right lower quadrant. There are multiple diverticula mainly in the left hemi colon, without imaging signs of diverticulitis. Vascular/Lymphatic: No ascites or pneumoperitoneum. No abdominal or pelvic lymphadenopathy, by size criteria. No aneurysmal dilation of the major abdominal arteries. There are moderate peripheral atherosclerotic vascular calcifications of the aorta and its major branches. Reproductive: Markedly enlarged prostate gland. There is also asymmetrically enlarged left seminal vesicle in comparison to the right. Correlate clinically and with serum PSA levels to determine the need for additional imaging with MRI prostate.  Other: Infraumbilical midline surgical scar noted. There is mild anasarca. There is a 3.4 x 6.5 x 6.5 cm intramuscular hematoma along the left mid back region. There is mildly displaced fracture of the overlying left tenth rib, posteriorly. There is also an additional subcutaneous 1.6 x 3.0 cm hematoma over the left gluteal region. No retroperitoneal hematoma noted. There bilateral small fat containing inguinal hernias. Musculoskeletal: No suspicious osseous lesions. There are mild - moderate multilevel degenerative changes in the visualized spine. Metallic hardware noted in the left proximal femur, from prior intertrochanteric fracture repair. IMPRESSION: 1. There is a 3.4 x 6.5 x 6.5 cm intramuscular hematoma along the left mid back region with mildly displaced fracture of the overlying left tenth rib, posteriorly. There is also an additional subcutaneous 1.6 x 3.0 cm hematoma over the  left gluteal region. No retroperitoneal hematoma noted. 2. Multiple bilateral simple and proteinaceous/hemorrhagic cysts. Bilateral vertebral mm sized nonobstructing renal calculi. No hydroureteronephrosis or ureterolithiasis on either side. 3. Multiple other nonacute observations, as described above. Aortic Atherosclerosis (ICD10-I70.0). Electronically Signed   By: Ree Molt M.D.   On: 11/20/2023 17:41         Scheduled Meds:  amLODipine   10 mg Oral Daily   carvedilol   12.5 mg Oral BID WC   cyanocobalamin   1,000 mcg Oral Daily   escitalopram   20 mg Oral Daily   hydrALAZINE   50 mg Oral TID   insulin  aspart  0-20 Units Subcutaneous TID WC   insulin  aspart  4 Units Subcutaneous TID WC   insulin  glargine  15 Units Subcutaneous QHS   multivitamin with minerals  1 tablet Oral Daily   polyethylene glycol  17 g Oral BID   Ensure Max Protein  11 oz Oral BID   senna-docusate  1 tablet Oral BID   tamsulosin   0.4 mg Oral Daily   Continuous Infusions:     LOS: 6 days    Time spent: 51 minutes    Derryl Duval, MD [facility]

## 2023-11-22 NOTE — Plan of Care (Signed)
  Problem: Coping: Goal: Ability to adjust to condition or change in health will improve Outcome: Progressing   Problem: Fluid Volume: Goal: Ability to maintain a balanced intake and output will improve Outcome: Progressing   Problem: Nutritional: Goal: Maintenance of adequate nutrition will improve Outcome: Progressing   Problem: Skin Integrity: Goal: Risk for impaired skin integrity will decrease Outcome: Progressing   Problem: Clinical Measurements: Goal: Ability to maintain clinical measurements within normal limits will improve Outcome: Progressing Goal: Will remain free from infection Outcome: Progressing Goal: Diagnostic test results will improve Outcome: Progressing   Problem: Coping: Goal: Level of anxiety will decrease Outcome: Progressing

## 2023-11-22 NOTE — Plan of Care (Signed)
  Problem: Education: Goal: Ability to describe self-care measures that may prevent or decrease complications (Diabetes Survival Skills Education) will improve Outcome: Progressing   Problem: Coping: Goal: Ability to adjust to condition or change in health will improve Outcome: Progressing   Problem: Fluid Volume: Goal: Ability to maintain a balanced intake and output will improve Outcome: Progressing   Problem: Health Behavior/Discharge Planning: Goal: Ability to identify and utilize available resources and services will improve Outcome: Progressing Goal: Ability to manage health-related needs will improve Outcome: Progressing   Problem: Metabolic: Goal: Ability to maintain appropriate glucose levels will improve Outcome: Progressing   Problem: Nutritional: Goal: Maintenance of adequate nutrition will improve Outcome: Progressing Goal: Progress toward achieving an optimal weight will improve Outcome: Progressing   Problem: Skin Integrity: Goal: Risk for impaired skin integrity will decrease Outcome: Progressing   Problem: Tissue Perfusion: Goal: Adequacy of tissue perfusion will improve Outcome: Progressing   Problem: Health Behavior/Discharge Planning: Goal: Ability to manage health-related needs will improve Outcome: Progressing   Problem: Clinical Measurements: Goal: Ability to maintain clinical measurements within normal limits will improve Outcome: Progressing Goal: Will remain free from infection Outcome: Progressing Goal: Diagnostic test results will improve Outcome: Progressing Goal: Respiratory complications will improve Outcome: Progressing Goal: Cardiovascular complication will be avoided Outcome: Progressing   Problem: Activity: Goal: Risk for activity intolerance will decrease Outcome: Progressing   Problem: Nutrition: Goal: Adequate nutrition will be maintained Outcome: Progressing   Problem: Coping: Goal: Level of anxiety will  decrease Outcome: Progressing   Problem: Elimination: Goal: Will not experience complications related to bowel motility Outcome: Progressing Goal: Will not experience complications related to urinary retention Outcome: Progressing   Problem: Pain Managment: Goal: General experience of comfort will improve and/or be controlled Outcome: Progressing   Problem: Safety: Goal: Ability to remain free from injury will improve Outcome: Progressing   Problem: Skin Integrity: Goal: Risk for impaired skin integrity will decrease Outcome: Progressing

## 2023-11-22 NOTE — Progress Notes (Signed)
.  Subjective: 5 Days Post-Op Procedure(s) (LRB): FIXATION, FRACTURE, INTERTROCHANTERIC, WITH INTRAMEDULLARY ROD (Left)  Seen and examined seated in bedside chair. Complaining of pain in right flank and hip  Activity level:  WBAT LLE Diet tolerance:  as tolerated   Patient reports pain as severe.    Objective: Vital signs in last 24 hours: Temp:  [97.7 F (36.5 C)-98.1 F (36.7 C)] 97.7 F (36.5 C) (10/12 0451) Pulse Rate:  [78-80] 80 (10/12 0451) Resp:  [20] 20 (10/12 0451) BP: (152-158)/(65-80) 158/80 (10/12 0451) SpO2:  [97 %-98 %] 98 % (10/12 0451)  Labs: Recent Labs    11/20/23 0648 11/21/23 1248 11/22/23 0729  HGB 9.7* 9.8* 10.3*   Recent Labs    11/21/23 1248 11/22/23 0729  WBC 7.8 9.4  RBC 3.28*  3.27* 3.35*  HCT 31.2* 31.7*  PLT 310 347   Recent Labs    11/21/23 1248 11/22/23 0639  NA 134* 134*  K 4.0 4.6  CL 100 100  CO2 24 24  BUN 29* 26*  CREATININE 1.09 1.02  GLUCOSE 248* 235*  CALCIUM  9.5 9.6   Recent Labs    11/21/23 1248  INR 1.0    Physical Exam:  Neurologically intact ABD soft Neurovascular intact Sensation intact distally Intact pulses distally Dorsiflexion/Plantar flexion intact Incision: dressing C/D/I  Assessment/Plan:  5 Days Post-Op Procedure(s) (LRB): FIXATION, FRACTURE, INTERTROCHANTERIC, WITH INTRAMEDULLARY ROD (Left)  WBAT LLE PT/Rehab - pending SNF Diet as tolerated Given patient's difficulty with movement/transfer to chair would consider starting daily lovenox  for DVT ppx with transition to aspirin  81 BID at discharge Pain control as needed Continue aggressive bowel regimen Patient can follow up in the office 2 weeks after surgery date for incision check and new XR    Adine [name] Mon 11/22/2023, 1:16 PM

## 2023-11-22 NOTE — Plan of Care (Signed)
   Problem: Education: Goal: Ability to describe self-care measures that may prevent or decrease complications (Diabetes Survival Skills Education) will improve Outcome: Progressing

## 2023-11-22 NOTE — Progress Notes (Signed)
 Physical Therapy Treatment Patient Details Name: [name] MRN: [id number] DOB: 07/27/1934 Today's Date: 11/22/2023   History of Present Illness Pt is an 89 y.o. male whom presents to therapy following mechanical fall in home setting in which pt struck L hip, flank and mid back as well as forehead. Fall resulted in L intertrochanteric femur fx and L 9 and 10th rib fx. Pt underwent L hip cephalomedulary nailing on 11/17/2023 and is currently L LE WBAT. Pt PMH includes but is not limited to: HFmrEF, insulin -dependent DM2, HTN, hyperlipidemia, BPH, anxiety, and CAD.    PT Comments  POD # 5 Cognition Comments: Improved alertness, feels tired and sore    following all commands. Pre medicated prior to session 5 mg OXY Assisted OOB was difficult.  General bed mobility comments: with HOB eelvated and use of bed pad to complete scooting to EOB required Max Assist + 2. General transfer comment: required + 2 side by side assist to rise from elevated bed onto B Platform EVA walker with Max VC's on proper use as well as 1/4 turn completion to [facility] for a BM.  Hand over hand assist to guide B hands from EVA walker to [facility] then assisted with a control desend.  Pt sat BSC several minutes and had a large soft BM.  Assisted with peri care standing + 2 assist. General Gait Details: Used B platform EVA walker + 2 Max Assist and recliner following closely behind.  Pt was able to stake small, short steps with heavy lean on walker pads.  Distance limited by fatigue/effort 15 feet.  Pain 8/10. Positioned in recliner to comfort and applied ICE to left hip. Prior to admit, Pt was IND with amb and ADL's living with Son.  LPT has rec Pt will need ST Rehab at SNF to address mobility and functional decline prior to safely returning home.     If plan is discharge home, recommend the following: Two people to help with walking and/or transfers;Two people to help with bathing/dressing/bathroom;Assistance with  cooking/housework;Assist for transportation;Help with stairs or ramp for entrance   Can travel by private vehicle        Equipment Recommendations  None recommended by PT    Recommendations for Other Services       Precautions / Restrictions Precautions Precautions: Fall Restrictions Weight Bearing Restrictions Per Provider Order: No LLE Weight Bearing Per Provider Order: Weight bearing as tolerated     Mobility  Bed Mobility Overal bed mobility: Needs Assistance Bed Mobility: Supine to Sit     Supine to sit: Max assist, +2 for physical assistance, +2 for safety/equipment     General bed mobility comments: with HOB eelvated and use of bed pad to complete scooting to EOB required Max Assist + 2.    Transfers Overall transfer level: Needs assistance   Transfers: Sit to/from Stand Sit to Stand: Max assist, +2 physical assistance, +2 safety/equipment, From elevated surface           General transfer comment: required + 2 side by side assist to rise from elevated bed onto B Platform EVA walker with Max VC's on proper use as well as 1/4 turn completion to [facility] for a BM.  Hand over hand assist to guide B hands from EVA walker to [facility] then assisted with a control desend.  Pt sat BSC several minutes and had a large soft BM.  Assisted with peri care standing + 2 assist.    Ambulation/Gait Ambulation/Gait assistance: Max assist, +2  physical assistance, +2 safety/equipment Gait Distance (Feet): 15 Feet Assistive device: Elyn Finder Gait Pattern/deviations: Step-to pattern, Decreased stance time - left, Trunk flexed, Decreased step length - left, Decreased step length - right Gait velocity: decreased     General Gait Details: Used B platform EVA walker + 2 Max Assist and recliner following closely behind.  Pt was able to stake small, short steps with heavy lean on walker pads.  Distance limited by fatigue/effort 15 feet.  Pain 8/10.   Stairs             Wheelchair  Mobility     Tilt Bed    Modified Rankin (Stroke Patients Only)       Balance                                            Communication Communication Communication: No apparent difficulties Factors Affecting Communication: Hearing impaired  Cognition Arousal: Alert Behavior During Therapy: WFL for tasks assessed/performed   PT - Cognitive impairments: No apparent impairments                       PT - Cognition Comments: Improved alertness, feels tired and sore    following all commands. Following commands: Intact      Cueing Cueing Techniques: Verbal cues  Exercises      General Comments        Pertinent Vitals/Pain Pain Assessment Pain Assessment: Faces Faces Pain Scale: Hurts whole lot Pain Location: L hip and LE Pain Descriptors / Indicators: Aching, Constant, Discomfort, Grimacing, Operative site guarding, Sore Pain Intervention(s): Monitored during session, Premedicated before session, Repositioned, Ice applied    Home Living                          Prior Function            PT Goals (current goals can now be found in the care plan section) Progress towards PT goals: Progressing toward goals    Frequency    Min 3X/week      PT Plan      Co-evaluation              AM-PAC PT 6 Clicks Mobility   Outcome Measure  Help needed turning from your back to your side while in a flat bed without using bedrails?: A Lot Help needed moving from lying on your back to sitting on the side of a flat bed without using bedrails?: A Lot Help needed moving to and from a bed to a chair (including a wheelchair)?: A Lot Help needed standing up from a chair using your arms (e.g., wheelchair or bedside chair)?: A Lot Help needed to walk in hospital room?: A Lot Help needed climbing 3-5 steps with a railing? : Total 6 Click Score: 11    End of Session Equipment Utilized During Treatment: Gait belt Activity  Tolerance: Patient limited by fatigue;Patient limited by pain Patient left: in chair;with call bell/phone within reach Nurse Communication: Mobility status;Need for lift equipment;Other (comment) PT Visit Diagnosis: Unsteadiness on feet (R26.81);Other abnormalities of gait and mobility (R26.89);Muscle weakness (generalized) (M62.81);History of falling (Z91.81);Difficulty in walking, not elsewhere classified (R26.2);Pain Pain - Right/Left: Left Pain - part of body: Knee;Leg;Hip     Time: 1100-1129 PT Time Calculation (min) (ACUTE ONLY): 29 min  Charges:    $  Gait Training: 8-22 mins $Therapeutic Activity: 8-22 mins PT General Charges $$ ACUTE PT VISIT: 1 Visit                    Katheryn Leap  PTA Acute  Rehabilitation Services Office M-F          [phone]

## 2023-11-23 DIAGNOSIS — S72002A Fracture of unspecified part of neck of left femur, initial encounter for closed fracture: Secondary | ICD-10-CM | POA: Diagnosis not present

## 2023-11-23 LAB — GLUCOSE, CAPILLARY
Glucose-Capillary: 207 mg/dL — ABNORMAL HIGH (ref 70–99)
Glucose-Capillary: 242 mg/dL — ABNORMAL HIGH (ref 70–99)
Glucose-Capillary: 227 mg/dL — ABNORMAL HIGH (ref 70–99)
Glucose-Capillary: 173 mg/dL — ABNORMAL HIGH (ref 70–99)

## 2023-11-23 MED ORDER — OXYCODONE HCL 5 MG PO TABS: 5.0000 mg | ORAL_TABLET | ORAL | 0 refills | Status: DC | PRN

## 2023-11-23 MED ORDER — ASPIRIN EC 81 MG PO TBEC: 81.0000 mg | DELAYED_RELEASE_TABLET | Freq: Two times a day (BID) | ORAL | 0 refills | Status: DC

## 2023-11-23 MED ORDER — INSULIN GLARGINE 100 UNIT/ML ~~LOC~~ SOLN
20.0000 [IU] | Freq: Every day | SUBCUTANEOUS | Status: DC
Start: 2023-11-23 — End: 2023-11-24
  Administered 2023-11-23: 20 [IU] via SUBCUTANEOUS
  Filled 2023-11-23 (×2): qty 0.2

## 2023-11-23 MED ORDER — ENOXAPARIN SODIUM 40 MG/0.4ML IJ SOSY
40.0000 mg | PREFILLED_SYRINGE | INTRAMUSCULAR | Status: DC
  Administered 2023-11-23 – 2023-11-24 (×2): 40 mg via SUBCUTANEOUS
  Filled 2023-11-23 (×2): qty 0.4

## 2023-11-23 NOTE — Progress Notes (Signed)
 Ins auth started for [facility]; pending approval.

## 2023-11-23 NOTE — Progress Notes (Signed)
 Physical Therapy Treatment Patient Details Name: [name] MRN: [id number] DOB: [birth year] Today's Date: 11/23/2023   History of Present Illness Pt is an 89 y.o. male whom presents to therapy following mechanical fall in home setting in which pt struck L hip, flank and mid back as well as forehead. Fall resulted in L intertrochanteric femur fx and L 9 and 10th rib fx. Pt underwent L hip cephalomedulary nailing on 11/17/2023 and is currently L LE WBAT. Pt PMH includes but is not limited to: HFmrEF, insulin -dependent DM2, HTN, hyperlipidemia, BPH, anxiety, and CAD.    PT Comments  POD # 6 Progressing slowly- Cognition Comments: Improved alertness, feels tired and sore    following all commands. Pt was pre medicated with 5mg  OXY as well as scheduled Robaxin .  Assisted to EOB with some progress.  General bed mobility comments: with HOB elvated and use of bed pad to complete scooting to EOB required Max Assist + 2.  Able to self static sit at Supervision level.  I need to go to the bathroom, stated Pt. General transfer comment: assisted from elevated bed to [facility] 1/4 turn + 2 Max Assist with assist to guide buttocks to complete turn and position.  Assisted off BSC + 2 side by side Max Assist VC's on proper hand placment to push up vs pull on walker.  Assist5ed with peri care after BM.  Then assisted to EVA walker to amb. General Gait Details: Used B platform EVA walker + 2 Max Assist and recliner following closely behind.  Pt was able to take small, short steps with heavy lean on walker pads.  Distance limited by fatigue/effort 14 + 12 feet one seated rest break.  VC's on upright posture and assist with EVA walker to prevent Pt pushing too far forward.   Pain 8/10. Returned to room in recliner.  Positined to comfort and applied ICE to LEFT hip. Prior to admit, Pt was living with his Son and IND with amb/ADLs.  Just recently gave up driving.  LPT has rec Pt will need ST Rehab at SNF to address  mobility and functional decline prior to safely returning home.    If plan is discharge home, recommend the following: Two people to help with walking and/or transfers;Two people to help with bathing/dressing/bathroom;Assistance with cooking/housework;Assist for transportation;Help with stairs or ramp for entrance   Can travel by private vehicle     No  Equipment Recommendations  None recommended by PT    Recommendations for Other Services       Precautions / Restrictions Precautions Precautions: Fall Restrictions Weight Bearing Restrictions Per Provider Order: No LLE Weight Bearing Per Provider Order: Weight bearing as tolerated     Mobility  Bed Mobility Overal bed mobility: Needs Assistance   Rolling: Max assist, +2 for physical assistance, +2 for safety/equipment, Used rails         General bed mobility comments: with HOB elvated and use of bed pad to complete scooting to EOB required Max Assist + 2.  Able to self static sit at Supervision level.  I need to go to the bathroom, stated Pt.    Transfers Overall transfer level: Needs assistance Equipment used: Rolling walker (2 wheels) Transfers: Sit to/from Stand Sit to Stand: Max assist, +2 physical assistance, +2 safety/equipment, From elevated surface           General transfer comment: assisted from elevated bed to [facility] 1/4 turn + 2 Max Assist with assist to guide buttocks to complete  turn and position.  Assisted off BSC + 2 side by side Max Assist VC's on proper hand placment to push up vs pull on walker.  Assist5ed with peri care after BM.  Then assisted to EVA walker to amb.    Ambulation/Gait Ambulation/Gait assistance: Max assist, +2 physical assistance, +2 safety/equipment Gait Distance (Feet): 26 Feet Assistive device: Elyn Finder Gait Pattern/deviations: Step-to pattern, Decreased stance time - left, Trunk flexed, Decreased step length - left, Decreased step length - right Gait velocity: decreased      General Gait Details: Used B platform EVA walker + 2 Max Assist and recliner following closely behind.  Pt was able to take small, short steps with heavy lean on walker pads.  Distance limited by fatigue/effort 14 + 12 feet one seated rest break.  VC's on upright posture and assist with EVA walker to prevent Pt pushing too far forward.   Pain 8/10.   Stairs             Wheelchair Mobility     Tilt Bed    Modified Rankin (Stroke Patients Only)       Balance                                            Communication Communication Communication: No apparent difficulties Factors Affecting Communication: Hearing impaired  Cognition Arousal: Alert Behavior During Therapy: WFL for tasks assessed/performed   PT - Cognitive impairments: No apparent impairments                       PT - Cognition Comments: Improved alertness, feels tired and sore    following all commands. Following commands: Intact      Cueing Cueing Techniques: Verbal cues  Exercises      General Comments        Pertinent Vitals/Pain Pain Assessment Pain Assessment: Faces Faces Pain Scale: Hurts even more Pain Location: L hip Pain Descriptors / Indicators: Discomfort, Grimacing, Operative site guarding, Tender Pain Intervention(s): Monitored during session, Premedicated before session, Repositioned, Ice applied    Home Living                          Prior Function            PT Goals (current goals can now be found in the care plan section) Progress towards PT goals: Progressing toward goals    Frequency    Min 3X/week      PT Plan      Co-evaluation              AM-PAC PT 6 Clicks Mobility   Outcome Measure  Help needed turning from your back to your side while in a flat bed without using bedrails?: A Lot Help needed moving from lying on your back to sitting on the side of a flat bed without using bedrails?: A Lot Help needed  moving to and from a bed to a chair (including a wheelchair)?: A Lot Help needed standing up from a chair using your arms (e.g., wheelchair or bedside chair)?: A Lot Help needed to walk in hospital room?: A Lot Help needed climbing 3-5 steps with a railing? : Total 6 Click Score: 11    End of Session Equipment Utilized During Treatment: Gait belt Activity Tolerance: Patient limited by fatigue;Patient limited  by pain Patient left: in chair;with call bell/phone within reach Nurse Communication: Mobility status PT Visit Diagnosis: Unsteadiness on feet (R26.81);Other abnormalities of gait and mobility (R26.89);Muscle weakness (generalized) (M62.81);History of falling (Z91.81);Difficulty in walking, not elsewhere classified (R26.2);Pain Pain - Right/Left: Left Pain - part of body: Knee;Leg;Hip     Time: 8860-8795 PT Time Calculation (min) (ACUTE ONLY): 25 min  Charges:    $Gait Training: 8-22 mins $Therapeutic Activity: 8-22 mins PT General Charges $$ ACUTE PT VISIT: 1 Visit                     Katheryn Leap  PTA Acute  Rehabilitation Services Office M-F          [phone]

## 2023-11-23 NOTE — Progress Notes (Signed)
 PROGRESS NOTE    [name]  FMW:[id number] DOB: 07/24/1934 DOA: 11/16/2023 PCP: [name], MD    Brief Narrative:  89 year old with history of anxiety, coronary artery disease, hyperlipidemia, type 2 diabetes on insulin , nephrolithiasis, BPH fell at home on his left hip.  Found to have intertrochanteric fracture left hip.  CT scan abdomen pelvis positive for 9.2 cm exophytic right upper lobe renal mass and also significant constipation.  Also nondisplaced fracture of the left side 9th and 10th rib.  Underwent IM nailing 10/7.  Therapy recommended SNF.   Subjective:  Patient seen and examined.  Sister at the bedside.  Patient stated significant difficulty mobilizing around as well as pain.  He did attempt to walk yesterday.  Assessment & Plan: Closed traumatic intertrochanteric fracture of the left hip. -ORIF IM nailing 10/7-Dr. Sherida -Weightbearing as tolerated -Pain management. Bowel regimen.  -SCD for VTE prophylaxis.  Lovenox  while in the hospital.  Aspirin  81 mg twice daily for 6 weeks on discharge. -Outpatient follow-up with orthopedics.   -Work with PT OT and referred to a SNF for rehab.  ABLA due to large abdominal wall hematoma: CT on 10/10 showed 3.4 x 6.5 x 6.5 cm intramuscular hematoma along the left mid back and additional 1.6 x 3.0 cm hematoma over left gluteal region. Hemoglobin dropped as anticipated from 13-10.  Currently does not have evidence of ongoing bleeding.  Benefits outweigh complications-will start back on Lovenox  and monitor.   Recent Labs    06/08/23 1428 06/08/23 1443 06/09/23 0742 08/06/23 0125 11/16/23 0820 11/17/23 0546 11/18/23 0602 11/20/23 0648 11/21/23 1248 11/22/23 0729  HGB 13.2 13.6 13.5 12.9* 14.6 11.4* 9.8* 9.7* 9.8* 10.3*    Nondisplaced fracture of the left side 9th and 10th rib. - Incentive spirometry and pain control. - Robaxin .  Continue oxycodone  as needed  Right renal mass: Incidental finding of right renal  mass on CT scan.  Concerning for malignancy. -Patient and his son updated by previous provider -Message was sent to Dr. Renda for follow-up.  Severe constipation: Resolved with bowel regimen.  LBM 10/10. -Continue bowel regimen  IDDM-2 with hyperglycemia: A1c 8.8% on 8/19. Recent Labs  Lab 11/22/23 1150 11/22/23 1643 11/22/23 2053 11/23/23 0812 11/23/23 1129  GLUCAP 257* 155* 150* 207* 242*  - basal insulin  15 units at bedtime-increased to 20 units. -continue  NovoLog  4 units 3 times daily with meals -Continue SSI-resistant scale   Essential hypertension: SBP in 160s. -Continue Coreg  and hydralazine . -Increase amlodipine  -Further adjustment as appropriate - Pain control   BPH:  -continue with  Flomax .  AKI: Resolved.  Class I obesity Body mass index is 30.96 kg/m.     DVT prophylaxis: enoxaparin  (LOVENOX ) injection 40 mg Start: 11/23/23 1000 Place and maintain sequential compression device Start: 11/21/23 1232 SCDs Start: 11/16/23 1147   Code Status: Full code Family Communication: Sister at the bedside. Disposition Plan: Status is: Inpatient Remains inpatient appropriate because: Medically stable.  Waiting for SNF bed.     Consultants:  Orthopedics  Procedures:  Left hip ORIF  Antimicrobials:  Perioperative     Objective: Vitals:   11/22/23 2028 11/23/23 0432 11/23/23 0859 11/23/23 0905  BP: (!) 147/82 (!) 166/71 (!) 161/89   Pulse: 69 76 89 86  Resp: 19 19 20    Temp: 98.6 F (37 C) 99.1 F (37.3 C) 98.2 F (36.8 C)   TempSrc: Oral Axillary Oral   SpO2: 97% 96% 93% 97%  Weight:  Height:        Intake/Output Summary (Last 24 hours) at 11/23/2023 1133 Last data filed at 11/23/2023 0700 Gross per 24 hour  Intake 1300 ml  Output 1850 ml  Net -550 ml   Filed Weights   11/17/23 0949 11/17/23 1014  Weight: 100.7 kg 100.7 kg    Examination:  GENERAL: No apparent distress.  Looks fairly comfortable.  Painful with  mobility. HEENT: MMM.  Hard of hearing. NECK: Supple.  No apparent JVD.  RESP:  No IWOB.  Fair aeration bilaterally. CVS:  RRR. Heart sounds normal.  ABD/GI/GU: BS+. Abd soft, NTND.  MSK/EXT:   No apparent deformity. Moves extremities. No edema.  SKIN: Patient has large area of ecchymosis left flank.  No palpable fluid. Mild swelling of the left hip.  Incisions intact.   Data Reviewed: I have personally reviewed following labs and imaging studies  CBC: Recent Labs  Lab 11/17/23 0546 11/18/23 0602 11/20/23 0648 11/21/23 1248 11/22/23 0729  WBC 12.2* 12.4* 8.9 7.8 9.4  HGB 11.4* 9.8* 9.7* 9.8* 10.3*  HCT 35.9* 30.7* 30.3* 31.2* 31.7*  MCV 94.2 97.2 95.6 95.1 94.6  PLT 250 221 252 310 347   Basic Metabolic Panel: Recent Labs  Lab 11/17/23 0546 11/18/23 0602 11/20/23 0648 11/21/23 1248 11/22/23 0639  NA 137 138 134* 134* 134*  K 3.6 3.7 4.0 4.0 4.6  CL 102 104 101 100 100  CO2 23 23 25 24 24   GLUCOSE 223* 214* 237* 248* 235*  BUN 43* 39* 39* 29* 26*  CREATININE 1.62* 1.61* 1.20 1.09 1.02  CALCIUM  9.4 9.3 9.3 9.5 9.6  MG  --   --   --  2.2 2.0  PHOS  --   --   --  2.7 2.7   GFR: Estimated Creatinine Clearance: 62.9 mL/min (by C-G formula based on SCr of 1.02 mg/dL). Liver Function Tests: Recent Labs  Lab 11/17/23 0546 11/21/23 1248 11/22/23 0639  AST 16  --   --   ALT 14  --   --   ALKPHOS 58  --   --   BILITOT 0.5  --   --   PROT 5.6*  --   --   ALBUMIN 3.6 3.1* 3.3*   No results for input(s): LIPASE, AMYLASE in the last 168 hours. No results for input(s): AMMONIA in the last 168 hours. Coagulation Profile: Recent Labs  Lab 11/21/23 1248  INR 1.0   Cardiac Enzymes: No results for input(s): CKTOTAL, CKMB, CKMBINDEX, TROPONINI in the last 168 hours. BNP (last 3 results) No results for input(s): PROBNP in the last 8760 hours. HbA1C: No results for input(s): HGBA1C in the last 72 hours. CBG: Recent Labs  Lab 11/22/23 1150  11/22/23 1643 11/22/23 2053 11/23/23 0812 11/23/23 1129  GLUCAP 257* 155* 150* 207* 242*   Lipid Profile: No results for input(s): CHOL, HDL, LDLCALC, TRIG, CHOLHDL, LDLDIRECT in the last 72 hours. Thyroid  Function Tests: No results for input(s): TSH, T4TOTAL, FREET4, T3FREE, THYROIDAB in the last 72 hours. Anemia Panel: Recent Labs    11/21/23 1248  VITAMINB12 559  FOLATE 11.8  FERRITIN 86  TIBC 238*  IRON 30*  RETICCTPCT 3.0   Sepsis Labs: No results for input(s): PROCALCITON, LATICACIDVEN in the last 168 hours.  No results found for this or any previous visit (from the past 240 hours).       Radiology Studies: No results found.        Scheduled Meds:  amLODipine   10  mg Oral Daily   carvedilol   12.5 mg Oral BID WC   cyanocobalamin   1,000 mcg Oral Daily   enoxaparin  (LOVENOX ) injection  40 mg Subcutaneous Q24H   escitalopram   20 mg Oral Daily   hydrALAZINE   50 mg Oral TID   insulin  aspart  0-20 Units Subcutaneous TID WC   insulin  aspart  4 Units Subcutaneous TID WC   insulin  glargine  15 Units Subcutaneous QHS   methocarbamol   250 mg Oral QID   multivitamin with minerals  1 tablet Oral Daily   polyethylene glycol  17 g Oral BID   Ensure Max Protein  11 oz Oral BID   senna-docusate  1 tablet Oral BID   tamsulosin   0.4 mg Oral Daily   Continuous Infusions:     LOS: 7 days    Time spent: 45 minutes    Renato Applebaum, MD [facility]

## 2023-11-24 DIAGNOSIS — Z7401 Bed confinement status: Secondary | ICD-10-CM | POA: Diagnosis not present

## 2023-11-24 DIAGNOSIS — R1313 Dysphagia, pharyngeal phase: Secondary | ICD-10-CM | POA: Diagnosis not present

## 2023-11-24 DIAGNOSIS — W19XXXA Unspecified fall, initial encounter: Secondary | ICD-10-CM | POA: Diagnosis not present

## 2023-11-24 DIAGNOSIS — N138 Other obstructive and reflux uropathy: Secondary | ICD-10-CM | POA: Diagnosis not present

## 2023-11-24 DIAGNOSIS — S72002D Fracture of unspecified part of neck of left femur, subsequent encounter for closed fracture with routine healing: Secondary | ICD-10-CM | POA: Diagnosis not present

## 2023-11-24 DIAGNOSIS — Z9181 History of falling: Secondary | ICD-10-CM | POA: Diagnosis not present

## 2023-11-24 DIAGNOSIS — I10 Essential (primary) hypertension: Secondary | ICD-10-CM | POA: Diagnosis not present

## 2023-11-24 DIAGNOSIS — R531 Weakness: Secondary | ICD-10-CM | POA: Diagnosis not present

## 2023-11-24 DIAGNOSIS — R2689 Other abnormalities of gait and mobility: Secondary | ICD-10-CM | POA: Diagnosis not present

## 2023-11-24 DIAGNOSIS — I152 Hypertension secondary to endocrine disorders: Secondary | ICD-10-CM | POA: Diagnosis not present

## 2023-11-24 DIAGNOSIS — I2510 Atherosclerotic heart disease of native coronary artery without angina pectoris: Secondary | ICD-10-CM | POA: Diagnosis not present

## 2023-11-24 DIAGNOSIS — S72002A Fracture of unspecified part of neck of left femur, initial encounter for closed fracture: Secondary | ICD-10-CM | POA: Diagnosis not present

## 2023-11-24 DIAGNOSIS — M1990 Unspecified osteoarthritis, unspecified site: Secondary | ICD-10-CM | POA: Diagnosis not present

## 2023-11-24 DIAGNOSIS — M6281 Muscle weakness (generalized): Secondary | ICD-10-CM | POA: Diagnosis not present

## 2023-11-24 DIAGNOSIS — S2232XD Fracture of one rib, left side, subsequent encounter for fracture with routine healing: Secondary | ICD-10-CM | POA: Diagnosis not present

## 2023-11-24 DIAGNOSIS — K5730 Diverticulosis of large intestine without perforation or abscess without bleeding: Secondary | ICD-10-CM | POA: Diagnosis not present

## 2023-11-24 DIAGNOSIS — Z4789 Encounter for other orthopedic aftercare: Secondary | ICD-10-CM | POA: Diagnosis not present

## 2023-11-24 DIAGNOSIS — I5042 Chronic combined systolic (congestive) and diastolic (congestive) heart failure: Secondary | ICD-10-CM | POA: Diagnosis not present

## 2023-11-24 DIAGNOSIS — M25552 Pain in left hip: Secondary | ICD-10-CM | POA: Diagnosis not present

## 2023-11-24 DIAGNOSIS — Z743 Need for continuous supervision: Secondary | ICD-10-CM | POA: Diagnosis not present

## 2023-11-24 DIAGNOSIS — E785 Hyperlipidemia, unspecified: Secondary | ICD-10-CM | POA: Diagnosis not present

## 2023-11-24 DIAGNOSIS — E119 Type 2 diabetes mellitus without complications: Secondary | ICD-10-CM | POA: Diagnosis not present

## 2023-11-24 DIAGNOSIS — R2681 Unsteadiness on feet: Secondary | ICD-10-CM | POA: Diagnosis not present

## 2023-11-24 DIAGNOSIS — S72002S Fracture of unspecified part of neck of left femur, sequela: Secondary | ICD-10-CM | POA: Diagnosis not present

## 2023-11-24 LAB — GLUCOSE, CAPILLARY
Glucose-Capillary: 235 mg/dL — ABNORMAL HIGH (ref 70–99)
Glucose-Capillary: 233 mg/dL — ABNORMAL HIGH (ref 70–99)
Glucose-Capillary: 150 mg/dL — ABNORMAL HIGH (ref 70–99)

## 2023-11-24 MED ORDER — OXYCODONE HCL 5 MG PO TABS: 5.0000 mg | ORAL_TABLET | ORAL | 0 refills | Status: DC | PRN

## 2023-11-24 MED ORDER — SENNOSIDES-DOCUSATE SODIUM 8.6-50 MG PO TABS: 1.0000 | ORAL_TABLET | Freq: Two times a day (BID) | ORAL | Status: DC

## 2023-11-24 MED ORDER — OXYCODONE HCL 5 MG PO TABS
5.0000 mg | ORAL_TABLET | Freq: Once | ORAL | Status: AC
  Administered 2023-11-24: 5 mg via ORAL
  Filled 2023-11-24: qty 1

## 2023-11-24 MED ORDER — POLYETHYLENE GLYCOL 3350 17 G PO PACK: 17.0000 g | PACK | Freq: Two times a day (BID) | ORAL | Status: DC

## 2023-11-24 MED ORDER — ASPIRIN EC 81 MG PO TBEC: 81.0000 mg | DELAYED_RELEASE_TABLET | Freq: Two times a day (BID) | ORAL | Status: DC

## 2023-11-24 MED ORDER — LIVING WELL WITH DIABETES BOOK
Freq: Once | Status: DC
  Filled 2023-11-24: qty 1

## 2023-11-24 MED ORDER — METHOCARBAMOL 500 MG PO TABS: 250.0000 mg | ORAL_TABLET | Freq: Four times a day (QID) | ORAL | Status: DC

## 2023-11-24 NOTE — Plan of Care (Signed)
  Problem: Health Behavior/Discharge Planning: Goal: Ability to identify and utilize available resources and services will improve Outcome: Progressing Goal: Ability to manage health-related needs will improve Outcome: Progressing   Problem: Skin Integrity: Goal: Risk for impaired skin integrity will decrease Outcome: Progressing   Problem: Clinical Measurements: Goal: Will remain free from infection Outcome: Progressing Goal: Respiratory complications will improve Outcome: Progressing Goal: Cardiovascular complication will be avoided Outcome: Progressing   Problem: Activity: Goal: Risk for activity intolerance will decrease Outcome: Progressing   Problem: Nutrition: Goal: Adequate nutrition will be maintained Outcome: Progressing   Problem: Coping: Goal: Level of anxiety will decrease Outcome: Progressing   Problem: Elimination: Goal: Will not experience complications related to bowel motility Outcome: Progressing Goal: Will not experience complications related to urinary retention Outcome: Progressing   Problem: Pain Managment: Goal: General experience of comfort will improve and/or be controlled Outcome: Progressing   Problem: Safety: Goal: Ability to remain free from injury will improve Outcome: Progressing   Problem: Skin Integrity: Goal: Risk for impaired skin integrity will decrease Outcome: Progressing

## 2023-11-24 NOTE — Progress Notes (Signed)
 Physical Therapy Treatment Patient Details Name: [name] MRN: [id number] DOB: 15-Feb-1934 Today's Date: 11/24/2023   History of Present Illness Pt is an 89 y.o. male whom presents to therapy following mechanical fall in home setting in which pt struck L hip, flank and mid back as well as forehead. Fall resulted in L intertrochanteric femur fx and L 9 and 10th rib fx. Pt underwent L hip cephalomedulary nailing on 11/17/2023 and is currently L LE WBAT. Pt PMH includes but is not limited to: HFmrEF, insulin -dependent DM2, HTN, hyperlipidemia, BPH, anxiety, and CAD.    PT Comments  POD # 7 Progressing slowly- Cognition Comments: Improved alertness, feels tired and sore    following all commands. Pt was pre medicated with 5mg  OXY as well as scheduled Robaxin .   Assisted OOB to [facility] then from [facility] to EVA walker to amb.  General Gait Details: Used B platform EVA walker + 2 Max Assist and recliner following closely behind.  Amb 28 feet with EVA, took a seated rest break, then amb another 6 feet with regular RW with recliner following closely behind. Returned to room in recliner and positioned to comfort.  ICE applied LEFT hip. Prior to admit, Pt was IND amb with NO AD living with his Son.  LPT has rec Pt will need ST Rehab at SNF to address mobility and functional decline prior to safely returning home.    If plan is discharge home, recommend the following: Two people to help with walking and/or transfers;Two people to help with bathing/dressing/bathroom;Assistance with cooking/housework;Assist for transportation;Help with stairs or ramp for entrance   Can travel by private vehicle     No  Equipment Recommendations  None recommended by PT    Recommendations for Other Services       Precautions / Restrictions Precautions Precautions: Fall Restrictions Weight Bearing Restrictions Per Provider Order: No LLE Weight Bearing Per Provider Order: Weight bearing as tolerated     Mobility   Bed Mobility Overal bed mobility: Needs Assistance Bed Mobility: Supine to Sit     Supine to sit: Max assist, +2 for physical assistance, +2 for safety/equipment, Mod assist     General bed mobility comments: with HOB elvated and use of bed pad to complete scooting to EOB required Max Assist + 2.  Able to self static sit at Supervision level.  I need to go to the bathroom, stated Pt.    Transfers Overall transfer level: Needs assistance Equipment used: Rolling walker (2 wheels)   Sit to Stand: Max assist, +2 physical assistance, +2 safety/equipment, From elevated surface, Mod assist           General transfer comment: assisted from elevated bed to [facility] 1/4 turn + 2 Max Assist with assist to guide buttocks to complete turn and position.  Assisted off BSC + 2 side by side Max Assist VC's on proper hand placment to push up vs pull on walker.  Assist5ed with peri care after BM.  Then assisted to EVA walker to amb.    Ambulation/Gait Ambulation/Gait assistance: Mod assist, +2 physical assistance, +2 safety/equipment Gait Distance (Feet): 34 Feet Assistive device: Elyn Finder, Rolling walker (2 wheels) Gait Pattern/deviations: Step-to pattern, Decreased stance time - left, Trunk flexed, Decreased step length - left, Decreased step length - right Gait velocity: decreased     General Gait Details: Used B platform EVA walker + 2 Max Assist and recliner following closely behind.  Amb 28 feet with EVA, took a seated rest break,  then amb another 6 feet with regular RW with recliner following closely behind.   Stairs             Wheelchair Mobility     Tilt Bed    Modified Rankin (Stroke Patients Only)       Balance                                            Communication Communication Communication: No apparent difficulties Factors Affecting Communication: Hearing impaired  Cognition Arousal: Alert Behavior During Therapy: WFL for tasks  assessed/performed   PT - Cognitive impairments: No apparent impairments                       PT - Cognition Comments: Improved alertness, feels tired and sore    following all commands. Following commands: Intact      Cueing Cueing Techniques: Verbal cues  Exercises      General Comments        Pertinent Vitals/Pain Pain Assessment Pain Assessment: Faces Faces Pain Scale: Hurts little more Pain Location: L hip Pain Descriptors / Indicators: Discomfort, Grimacing, Operative site guarding, Tender Pain Intervention(s): Monitored during session, Premedicated before session, Repositioned, Ice applied    Home Living                          Prior Function            PT Goals (current goals can now be found in the care plan section) Progress towards PT goals: Progressing toward goals    Frequency    Min 3X/week      PT Plan      Co-evaluation              AM-PAC PT 6 Clicks Mobility   Outcome Measure  Help needed turning from your back to your side while in a flat bed without using bedrails?: A Lot Help needed moving from lying on your back to sitting on the side of a flat bed without using bedrails?: A Lot Help needed moving to and from a bed to a chair (including a wheelchair)?: A Lot Help needed standing up from a chair using your arms (e.g., wheelchair or bedside chair)?: A Lot Help needed to walk in hospital room?: A Lot Help needed climbing 3-5 steps with a railing? : Total 6 Click Score: 11    End of Session Equipment Utilized During Treatment: Gait belt Activity Tolerance: Patient tolerated treatment well Patient left: in chair;with call bell/phone within reach Nurse Communication: Mobility status PT Visit Diagnosis: Unsteadiness on feet (R26.81);Other abnormalities of gait and mobility (R26.89);Muscle weakness (generalized) (M62.81);History of falling (Z91.81);Difficulty in walking, not elsewhere classified  (R26.2);Pain Pain - Right/Left: Left Pain - part of body: Knee;Leg;Hip     Time: 0927-0952 PT Time Calculation (min) (ACUTE ONLY): 25 min  Charges:    $Gait Training: 8-22 mins $Therapeutic Activity: 8-22 mins PT General Charges $$ ACUTE PT VISIT: 1 Visit                     Katheryn Leap  PTA Acute  Rehabilitation Services Office M-F          [phone]

## 2023-11-24 NOTE — Progress Notes (Signed)
 Patient complains of  progressive pain left hip 8/10. Hospitalist provider contacted, V.O received to administer Oxycodone  5 mg prior to transport.

## 2023-11-24 NOTE — Progress Notes (Signed)
 Patient transported with PTAR, no changes from am assessment. Dressing to left hip, clean dry and intact.

## 2023-11-24 NOTE — Progress Notes (Addendum)
 Attempted to call report to [facility] [phone] room 112 , Nurse unavailable at this time message left for nurse to return call.

## 2023-11-24 NOTE — Progress Notes (Signed)
 PROGRESS NOTE    [name]  FMW:[id number] DOB: 04-07-1934 DOA: 11/16/2023 PCP: [name], MD    Brief Narrative:  89 year old with history of anxiety, coronary artery disease, hyperlipidemia, type 2 diabetes on insulin , nephrolithiasis, BPH fell at home on his left hip.  Found to have intertrochanteric fracture left hip.  CT scan abdomen pelvis positive for 9.2 cm exophytic right upper lobe renal mass and also significant constipation.  Also nondisplaced fracture of the left side 9th and 10th rib.  Underwent IM nailing 10/7.  Therapy recommended SNF.   Subjective:  Patient seen and examined.  Denies any complaints but have persistent pain.  He has done very slow recovery from the surgery due to overall deconditioning.  Assessment & Plan: Closed traumatic intertrochanteric fracture of the left hip. -ORIF IM nailing 10/7-Dr. Sherida -Weightbearing as tolerated -Pain management. Bowel regimen.  -SCD for VTE prophylaxis.  Lovenox  while in the hospital.  Aspirin  81 mg twice daily for 6 weeks on discharge. -Outpatient follow-up with orthopedics.   -Work with PT OT and referred to a SNF for rehab.  ABLA due to large abdominal wall hematoma: CT on 10/10 showed 3.4 x 6.5 x 6.5 cm intramuscular hematoma along the left mid back and additional 1.6 x 3.0 cm hematoma over left gluteal region. Hemoglobin dropped as anticipated from 13-10.  Currently does not have evidence of ongoing bleeding.  Benefits outweigh complications-will start back on Lovenox  and monitor.  Recheck hemoglobin frequently.  Will order for tomorrow. Recent Labs    06/08/23 1428 06/08/23 1443 06/09/23 0742 08/06/23 0125 11/16/23 0820 11/17/23 0546 11/18/23 0602 11/20/23 0648 11/21/23 1248 11/22/23 0729  HGB 13.2 13.6 13.5 12.9* 14.6 11.4* 9.8* 9.7* 9.8* 10.3*    Nondisplaced fracture of the left side 9th and 10th rib. - Incentive spirometry and pain control. - Robaxin .  Continue oxycodone  as  needed  Right renal mass: Incidental finding of right renal mass on CT scan.  Concerning for malignancy. -Patient and his son updated by previous provider -Message was sent to Dr. Renda for follow-up.  Severe constipation: Resolved with bowel regimen.  LBM 10/10. -Continue bowel regimen  IDDM-2 with hyperglycemia: A1c 8.8% on 8/19. Recent Labs  Lab 11/23/23 0812 11/23/23 1129 11/23/23 1640 11/23/23 2102 11/24/23 0723  GLUCAP 207* 242* 227* 173* 235*  - basal insulin  15 units at bedtime-increased to 20 units.  Patient is not very good on carb controlled.  To avoid hypoglycemia, will keep on 20 units today. -continue  NovoLog  4 units 3 times daily with meals -Continue SSI-resistant scale   Essential hypertension: SBP in 160s. -Continue Coreg  and hydralazine . -Increase amlodipine  -Further adjustment as appropriate - Pain control   BPH:  -continue with  Flomax .  AKI: Resolved.  Class I obesity Body mass index is 30.96 kg/m.     DVT prophylaxis: enoxaparin  (LOVENOX ) injection 40 mg Start: 11/23/23 1000 Place and maintain sequential compression device Start: 11/21/23 1232 SCDs Start: 11/16/23 1147   Code Status: Full code Family Communication: None today. Disposition Plan: Status is: Inpatient Remains inpatient appropriate because: Medically stable.  Waiting for SNF bed.     Consultants:  Orthopedics  Procedures:  Left hip ORIF  Antimicrobials:  Perioperative     Objective: Vitals:   11/23/23 1644 11/23/23 2050 11/24/23 0546 11/24/23 0840  BP: 129/65 138/63 (!) 158/76 (!) 162/71  Pulse: 71 74 70 79  Resp: 16 18 (!) 24 18  Temp:  98.6 F (37 C) 98.5 F (36.9 C)  TempSrc:  Oral Oral   SpO2: 94% 94% 96% 92%  Weight:      Height:        Intake/Output Summary (Last 24 hours) at 11/24/2023 1058 Last data filed at 11/24/2023 0909 Gross per 24 hour  Intake 718 ml  Output 700 ml  Net 18 ml   Filed Weights   11/17/23 0949 11/17/23 1014   Weight: 100.7 kg 100.7 kg    Examination:  GENERAL: No apparent distress.  Looks fairly comfortable.  Painful with mobility. HEENT: MMM.  Hard of hearing. NECK: Supple.  No apparent JVD.  RESP:  No IWOB.  Fair aeration bilaterally. CVS:  RRR. Heart sounds normal.  ABD/GI/GU: BS+. Abd soft, NTND.  MSK/EXT:   No apparent deformity. Moves extremities. No edema.  SKIN: Patient has large area of ecchymosis left flank.  No palpable fluid.  Slight induration present. Mild swelling of the left hip.  Incisions intact.   Data Reviewed: I have personally reviewed following labs and imaging studies  CBC: Recent Labs  Lab 11/18/23 0602 11/20/23 0648 11/21/23 1248 11/22/23 0729  WBC 12.4* 8.9 7.8 9.4  HGB 9.8* 9.7* 9.8* 10.3*  HCT 30.7* 30.3* 31.2* 31.7*  MCV 97.2 95.6 95.1 94.6  PLT 221 252 310 347   Basic Metabolic Panel: Recent Labs  Lab 11/18/23 0602 11/20/23 0648 11/21/23 1248 11/22/23 0639  NA 138 134* 134* 134*  K 3.7 4.0 4.0 4.6  CL 104 101 100 100  CO2 23 25 24 24   GLUCOSE 214* 237* 248* 235*  BUN 39* 39* 29* 26*  CREATININE 1.61* 1.20 1.09 1.02  CALCIUM  9.3 9.3 9.5 9.6  MG  --   --  2.2 2.0  PHOS  --   --  2.7 2.7   GFR: Estimated Creatinine Clearance: 62.9 mL/min (by C-G formula based on SCr of 1.02 mg/dL). Liver Function Tests: Recent Labs  Lab 11/21/23 1248 11/22/23 0639  ALBUMIN 3.1* 3.3*   No results for input(s): LIPASE, AMYLASE in the last 168 hours. No results for input(s): AMMONIA in the last 168 hours. Coagulation Profile: Recent Labs  Lab 11/21/23 1248  INR 1.0   Cardiac Enzymes: No results for input(s): CKTOTAL, CKMB, CKMBINDEX, TROPONINI in the last 168 hours. BNP (last 3 results) No results for input(s): PROBNP in the last 8760 hours. HbA1C: No results for input(s): HGBA1C in the last 72 hours. CBG: Recent Labs  Lab 11/23/23 0812 11/23/23 1129 11/23/23 1640 11/23/23 2102 11/24/23 0723  GLUCAP 207* 242* 227*  173* 235*   Lipid Profile: No results for input(s): CHOL, HDL, LDLCALC, TRIG, CHOLHDL, LDLDIRECT in the last 72 hours. Thyroid  Function Tests: No results for input(s): TSH, T4TOTAL, FREET4, T3FREE, THYROIDAB in the last 72 hours. Anemia Panel: Recent Labs    11/21/23 1248  VITAMINB12 559  FOLATE 11.8  FERRITIN 86  TIBC 238*  IRON 30*  RETICCTPCT 3.0   Sepsis Labs: No results for input(s): PROCALCITON, LATICACIDVEN in the last 168 hours.  No results found for this or any previous visit (from the past 240 hours).       Radiology Studies: No results found.        Scheduled Meds:  amLODipine   10 mg Oral Daily   carvedilol   12.5 mg Oral BID WC   cyanocobalamin   1,000 mcg Oral Daily   enoxaparin  (LOVENOX ) injection  40 mg Subcutaneous Q24H   escitalopram   20 mg Oral Daily   hydrALAZINE   50 mg Oral TID   insulin   aspart  0-20 Units Subcutaneous TID WC   insulin  aspart  4 Units Subcutaneous TID WC   insulin  glargine  20 Units Subcutaneous QHS   living well with diabetes book   Does not apply Once   methocarbamol   250 mg Oral QID   multivitamin with minerals  1 tablet Oral Daily   polyethylene glycol  17 g Oral BID   Ensure Max Protein  11 oz Oral BID   senna-docusate  1 tablet Oral BID   tamsulosin   0.4 mg Oral Daily   Continuous Infusions:     LOS: 8 days    Time spent: 45 minutes    Renato Applebaum, MD [facility]

## 2023-11-24 NOTE — TOC Transition Note (Signed)
 Transition of Care [facility]) - Discharge Note   Patient Details  Name: [name] MRN: [id number] Date of Birth: [birth year]  Transition of [facility]) CM/SW Contact:  Sheri ONEIDA Sharps, LCSW Phone Number: 11/24/2023, 2:02 PM   Clinical Narrative:    Pt medically ready to dc SNF. Call report [phone] room 112 given to nurse. DC packet left at nurses station. PT[facility] called at 2pm. No further ICM needs   Final next level of care: Skilled Nursing Facility Barriers to Discharge: Barriers Resolved   Patient Goals and CMS Choice Patient states their goals for this hospitalization and ongoing recovery are:: return home following STR CMS Medicare.gov Compare Post Acute Care list provided to:: Patient Choice offered to / list presented to : Patient [place] ownership interest in [facility].provided to:: Patient    Discharge Placement PASRR number recieved: 11/20/23            Patient chooses bed at: [facility] Patient to be transferred to facility by: [facility]   Patient and family notified of of transfer: 11/24/23  Discharge Plan and Services Additional resources added to the After Visit Summary for   In-house Referral: Clinical Social Work   Post Acute Care Choice: Skilled Nursing Facility          DME Arranged: N/A DME Agency: NA       HH Arranged: NA HH Agency: NA        Social Drivers of Health (SDOH) Interventions SDOH Screenings   Food Insecurity: No Food Insecurity (11/16/2023)  Housing: Low Risk  (11/16/2023)  Transportation Needs: No Transportation Needs (11/16/2023)  Utilities: Not At Risk (11/16/2023)  Alcohol Screen: Low Risk  (07/23/2023)  Depression (PHQ2-9): Medium Risk (10/23/2023)  Financial Resource Strain: Low Risk  (07/23/2023)  Physical Activity: Inactive (07/23/2023)  Social Connections: Moderately Isolated (11/16/2023)  Stress: No Stress Concern Present (07/23/2023)  Tobacco Use: Low Risk  (11/17/2023)  Health Literacy:  Inadequate Health Literacy (07/23/2023)     Readmission Risk Interventions    11/24/2023    2:00 PM 04/17/2023   10:53 AM 04/10/2023    1:41 PM  Readmission Risk Prevention Plan  Transportation Screening Complete Complete Complete  PCP or Specialist Appt within 5-7 Days  Complete Complete  PCP or Specialist Appt within 3-5 Days Complete    Home Care Screening  Complete Complete  Medication Review (RN CM)  Complete Complete  HRI or Home Care Consult Complete    Social Work Consult for Recovery Care Planning/Counseling Complete    Palliative Care Screening Not Applicable    Medication Review Oceanographer) Complete

## 2023-11-24 NOTE — Progress Notes (Addendum)
 Attempted to call report to [facility] [phone] room 112, RN unavailable. Call received from [facility]. She denied questions or concerns at this time.

## 2023-11-24 NOTE — Inpatient Diabetes Management (Signed)
 Inpatient Diabetes Program Recommendations  AACE/ADA: New Consensus Statement on Inpatient Glycemic Control (2015)  Target Ranges:  Prepandial:   less than 140 mg/dL      Peak postprandial:   less than 180 mg/dL (1-2 hours)      Critically ill patients:  140 - 180 mg/dL   Lab Results  Component Value Date   GLUCAP 233 (H) 11/24/2023   HGBA1C 8.8 (A) 09/29/2023    Review of Glycemic Control  Diabetes history: DM2 Outpatient Diabetes medications: Jardiance  10 mg daily, Humalog  4-10 TID,[name]  16 units QHS Current orders for Inpatient glycemic control: Lantus  20 at bedtime, Novolog  0-20 TID + 4 units TID  HgbA1C -8.8%  Inpatient Diabetes Program Recommendations:    Spoke with pt at bedside regarding his diabetes, HgbA1C of 8.8% and importance of making effort to eat healthy and avoid simple carbohydrates. Pt states he drinks a lot of juice and regular sodas. We discussed how these are running his blood sugars up and gave good alternatives such as diet soda, Crystal Light and Gatorade Zero. Pt states he will try to make effort to leave off these sugary beverages. Otherwise eat healthy diet with a lot of variety in different types of foods. Appreciative of visit.   Thank you. Shona Brandy, RD, LDN, CDCES Inpatient Diabetes Coordinator [phone]

## 2023-11-24 NOTE — Discharge Summary (Signed)
 Physician Discharge Summary  [name] FMW:[id number] DOB: 1934/04/16 DOA: 11/16/2023  PCP: [name], MD  Admit date: 11/16/2023 Discharge date: 11/24/2023  Admitted From: Home Disposition: Skilled nursing facility  Recommendations for Outpatient Follow-up:  Follow up with PCP in 1-2 weeks Please obtain BMP/CBC in one week Orthopedics to schedule follow-up Send referral to urology to schedule follow-up  Home Health: N/A Equipment/Devices: N/A  Discharge Condition: Fair CODE STATUS: Full code Diet recommendation: Low-salt and low-carb diet  Discharge summary: 89 year old with history of anxiety, coronary artery disease, hyperlipidemia, type 2 diabetes on insulin , nephrolithiasis, BPH fell at home on his left hip.  Found to have intertrochanteric fracture left hip.  CT scan abdomen pelvis positive for 9.2 cm exophytic right upper lobe renal mass and also significant constipation.  Also nondisplaced fracture of the left side 9th and 10th rib.  Has significant bruising left flank.   Underwent IM nailing 10/7.  Therapy recommended SNF.  Treated for following conditions.    Assessment & Plan: Closed traumatic intertrochanteric fracture of the left hip. -ORIF IM nailing 10/7-Dr. Sherida -Weightbearing as tolerated -Pain management. Bowel regimen.  Pain management with oxycodone , Tylenol , muscle relaxants.  High risk of constipation, keep on a scheduled Senokot and MiraLAX . -SCD for VTE prophylaxis.  Lovenox  while in the hospital.  Aspirin  81 mg twice daily for 6 weeks on discharge. -Outpatient follow-up with orthopedics.   - Continue inpatient therapies with PT OT. Patient on chronic pain management with tramadol , will hold until using oxycodone  postop.   ABLA due to large abdominal wall hematoma: CT on 10/10 showed 3.4 x 6.5 x 6.5 cm intramuscular hematoma along the left mid back and additional 1.6 x 3.0 cm hematoma over left gluteal region. Hemoglobin dropped as anticipated  from 13-10.  Currently does not have evidence of ongoing bleeding.  Restarted on Lovenox .  Discharging on aspirin . Please recheck hemoglobin every week to ensure stabilization.   Nondisplaced fracture of the left side 9th and 10th rib. - Incentive spirometry and pain control. - Robaxin .  Continue oxycodone  as needed.  Mobilize.   Right renal mass: Incidental finding of right renal mass on CT scan.  Concerning for malignancy. -Patient and his son updated by previous provider -Message was sent to Dr. Renda for follow-up.   Severe constipation: Resolved with bowel regimen.  LBM 10/10. -Continue bowel regimen   IDDM-2 with hyperglycemia: A1c 8.8% on 8/19. Last Labs         Recent Labs  Lab 11/23/23 0812 11/23/23 1129 11/23/23 1640 11/23/23 2102 11/24/23 0723  GLUCAP 207* 242* 227* 173* 235*    - On long-acting insulin  16 units, carb count insulin .  Continue.  Avoid hypoglycemia.    Essential hypertension: Blood pressure improved with resumption of home medications including carvedilol , hydralazine , amlodipine  and losartan .   BPH:  -continue with  Flomax .   AKI: Resolved.   Class I obesity Body mass index is 30.96 kg/m.   Chronically sick.  Poor mobility.  Stable enough to transition to a skilled level of care.  Discharge Diagnoses:  Principal Problem:   Closed left hip fracture (HCC) Active Problems:   Hypertension associated with diabetes (HCC)   Type 2 diabetes mellitus with vascular disease (HCC)   Hypokalemia   CAD (coronary artery disease)   BPH with obstruction/lower urinary tract symptoms   Obesity, class 1   HLD (hyperlipidemia)   Chronic combined systolic and diastolic congestive heart failure (HCC)   AKI (acute kidney injury)  Pseudohyponatremia    Discharge Instructions  Discharge Instructions     Call MD for:  extreme fatigue   Complete by: As directed    Call MD for:  severe uncontrolled pain   Complete by: As directed    Diet - low  sodium heart healthy   Complete by: As directed    Diet Carb Modified   Complete by: As directed    Discharge instructions   Complete by: As directed    Check hemoglobin in one week   Increase activity slowly   Complete by: As directed    No wound care   Complete by: As directed       Allergies as of 11/24/2023       Reactions   Morphine  Nausea Only, Other (See Comments), Nausea And Vomiting   Can take with food and usually doesn't cause nausea   Atorvastatin     Intolerant - nausea/myalgias   Codeine Phosphate Nausea And Vomiting   Metformin  And Related Diarrhea        Medication List     PAUSE taking these medications    traMADol  50 MG tablet Wait to take this until: December 01, 2023 Commonly known as: ULTRAM  TAKE 1 TABLET BY MOUTH EVERY 12 HOURS AS NEEDED FOR KNEE PAIN. What changed:  how much to take how to take this when to take this reasons to take this       STOP taking these medications    meloxicam  7.5 MG tablet Commonly known as: MOBIC    polyethylene glycol powder 17 GM/SCOOP powder Commonly known as: GLYCOLAX /MIRALAX  Replaced by: polyethylene glycol 17 g packet       TAKE these medications    Accu-Chek Aviva Soln Use as directed monthly. E11.59  Insulin  dependent   Accu-Chek Softclix Lancets lancets TEST BLOOD SUGAR UP TO THREE TIMES DAILY   amLODipine  10 MG tablet Commonly known as: NORVASC  Take 5 mg by mouth daily.   aspirin  EC 81 MG tablet Take 1 tablet (81 mg total) by mouth 2 (two) times daily. What changed: when to take this   carvedilol  12.5 MG tablet Commonly known as: COREG  TAKE 1 TABLET BY MOUTH TWICE  DAILY WITH MEALS   COD LIVER OIL PO Take 1 capsule by mouth 2 (two) times daily.   Contour Blood Glucose System w/Device Kit 1 each by Does not apply route 3 (three) times daily.   cyanocobalamin  1000 MCG tablet Commonly known as: VITAMIN B12 Take 1 tablet (1,000 mcg total) by mouth daily.   diclofenac  Sodium 1 %  Gel Commonly known as: VOLTAREN  Apply 2 g topically 4 (four) times daily.   Droplet Pen Needles 30G X 8 MM Misc Generic drug: Insulin  Pen Needle USE  WITH  INSULIN   PEN   empagliflozin  10 MG Tabs tablet Commonly known as: Jardiance  Take 1 tablet (10 mg total) by mouth daily before breakfast. Bill secondary: BIN [id number], PCN [id number], GRP [id number], ID [id number]   escitalopram  20 MG tablet Commonly known as: Lexapro  Take 1 tablet (20 mg total) by mouth daily.   furosemide  40 MG tablet Commonly known as: LASIX  Take 1 tablet (40 mg total) by mouth daily. Can take an additional 20mg  as needed for weight gain of 2 lbs in a day or 5lbs in a week   hydrALAZINE  50 MG tablet Commonly known as: APRESOLINE  TAKE 1 TABLET BY MOUTH 3 TIMES  DAILY   insulin  lispro 100 UNIT/ML KwikPen Commonly known as: HumaLOG  KwikPen INJECT SUBCUTANEOUSLY 4 TO  10  UNITS 3 TIMES DAILY BEFORE MEALS PER SLIDING SCALE: 151-200 = 4  UN, 201-250 = 6 UN, 251-300 = 8  UN, AND OVER 300 = 10 UN   losartan  25 MG tablet Commonly known as: Cozaar  Take 1 tablet (25 mg total) by mouth daily.   meclizine  12.5 MG tablet Commonly known as: ANTIVERT  Take 1 tablet (12.5 mg total) by mouth 3 (three) times daily as needed for dizziness.   methocarbamol  500 MG tablet Commonly known as: ROBAXIN  Take 0.5 tablets (250 mg total) by mouth 4 (four) times daily.   ondansetron  4 MG tablet Commonly known as: ZOFRAN  Take 1 tablet (4 mg total) by mouth every 6 (six) hours.   OneTouch Ultra test strip Generic drug: glucose blood USE TO CHECK UP TO 3 TIMES DAILY AS NEEDED   Contour Test test strip Generic drug: glucose blood Use as instructed   oxyCODONE  5 MG immediate release tablet Commonly known as: Oxy IR/ROXICODONE  Take 1 tablet (5 mg total) by mouth every 4 (four) hours as needed for moderate pain (pain score 4-6) or severe pain (pain score 7-10).   polyethylene glycol 17 g packet Commonly known as: MIRALAX  /  GLYCOLAX  Take 17 g by mouth 2 (two) times daily. Replaces: polyethylene glycol powder 17 GM/SCOOP powder   potassium chloride  SA 20 MEQ tablet Commonly known as: KLOR-CON  M Take 2 tablets (40 mEq total) by mouth daily.   senna-docusate 8.6-50 MG tablet Commonly known as: Senokot-S Take 1 tablet by mouth 2 (two) times daily.   tamsulosin  0.4 MG Caps capsule Commonly known as: FLOMAX  TAKE 1 CAPSULE BY MOUTH DAILY  AFTER SUPPER What changed: See the new instructions.   [name]  SoloStar 300 UNIT/ML Solostar Pen Generic drug: insulin  glargine (1 Unit Dial ) INJECT SUBCUTANEOUSLY 16 UNITS  AT BEDTIME What changed: See the new instructions.        Follow-up Information     [name], MD Follow up.   Specialty: Family Medicine Contact information: [address] [place] [address] [phone]         Renda Glance, MD. Call in 2 week(s).   Specialty: Urology Why: call if you do not hear for follow up Contact information: [address] [place] [address] [phone]                Allergies  Allergen Reactions   Morphine  Nausea Only, Other (See Comments) and Nausea And Vomiting    Can take with food and usually doesn't cause nausea   Atorvastatin      Intolerant - nausea/myalgias   Codeine Phosphate Nausea And Vomiting   Metformin  And Related Diarrhea    Consultations: Orthopedics   Procedures/Studies: CT ABDOMEN PELVIS WO CONTRAST Result Date: 11/20/2023 CLINICAL DATA:  Retroperitoneal bleed suspected. EXAM: CT ABDOMEN AND PELVIS WITHOUT CONTRAST TECHNIQUE: Multidetector CT imaging of the abdomen and pelvis was performed following the standard protocol without IV contrast. RADIATION DOSE REDUCTION: This exam was performed according to the departmental dose-optimization program which includes automated exposure control, adjustment of the mA and/or kV according to patient size and/or use of iterative reconstruction technique.  COMPARISON:  CT scan chest, abdomen and pelvis from 11/16/2023. FINDINGS: Lower chest: There are bilateral small pleural effusions with associated compressive atelectatic changes in the bilateral lower lobes. No suspicious mass. Top-normal heart size. No pericardial effusion. There are coronary artery atherosclerotic calcifications, in keeping with coronary artery disease. Hepatobiliary: The liver is normal in size. Non-cirrhotic configuration. No suspicious  mass. No intrahepatic or extrahepatic bile duct dilation. Gallbladder is surgically absent. Pancreas: Unremarkable. No pancreatic ductal dilatation or surrounding inflammatory changes. Spleen: Within normal limits. No focal lesion. Adrenals/Urinary Tract: Adrenal glands are unremarkable. No suspicious renal mass within the limitations of this unenhanced exam. There are multiple bilateral simple renal cysts with largest arising from the right kidney upper pole, posteriorly measuring up to 6.7 x 8.8 cm. There are several mildly hyperattenuating structures which can not be characterized as a simple cyst. For example, there is a 5.0 x 5.1 cm structure arising from the left kidney upper pole, anteriorly with internal CT attenuation of 24 Hounsfield units. This may represent a proteinaceous/hemorrhagic cyst however, correlation with multiphasic contrast-enhanced MRI abdomen as per renal mass protocol is recommended. There are at least 3, 1-2 mm size nonobstructing calculi in bilateral kidneys. No hydroureteronephrosis on either side. Urinary bladder is partially distended and appears within normal limits. Stomach/Bowel: No disproportionate dilation of the small or large bowel loops. No evidence of abnormal bowel wall thickening or inflammatory changes. The appendix was not visualized; however there is no acute inflammatory process in the right lower quadrant. There are multiple diverticula mainly in the left hemi colon, without imaging signs of diverticulitis.  Vascular/Lymphatic: No ascites or pneumoperitoneum. No abdominal or pelvic lymphadenopathy, by size criteria. No aneurysmal dilation of the major abdominal arteries. There are moderate peripheral atherosclerotic vascular calcifications of the aorta and its major branches. Reproductive: Markedly enlarged prostate gland. There is also asymmetrically enlarged left seminal vesicle in comparison to the right. Correlate clinically and with serum PSA levels to determine the need for additional imaging with MRI prostate. Other: Infraumbilical midline surgical scar noted. There is mild anasarca. There is a 3.4 x 6.5 x 6.5 cm intramuscular hematoma along the left mid back region. There is mildly displaced fracture of the overlying left tenth rib, posteriorly. There is also an additional subcutaneous 1.6 x 3.0 cm hematoma over the left gluteal region. No retroperitoneal hematoma noted. There bilateral small fat containing inguinal hernias. Musculoskeletal: No suspicious osseous lesions. There are mild - moderate multilevel degenerative changes in the visualized spine. Metallic hardware noted in the left proximal femur, from prior intertrochanteric fracture repair. IMPRESSION: 1. There is a 3.4 x 6.5 x 6.5 cm intramuscular hematoma along the left mid back region with mildly displaced fracture of the overlying left tenth rib, posteriorly. There is also an additional subcutaneous 1.6 x 3.0 cm hematoma over the left gluteal region. No retroperitoneal hematoma noted. 2. Multiple bilateral simple and proteinaceous/hemorrhagic cysts. Bilateral vertebral mm sized nonobstructing renal calculi. No hydroureteronephrosis or ureterolithiasis on either side. 3. Multiple other nonacute observations, as described above. Aortic Atherosclerosis (ICD10-I70.0). Electronically Signed   By: Ree Molt M.D.   On: 11/20/2023 17:41   DG HIP UNILAT WITH PELVIS 2-3 VIEWS LEFT Result Date: 11/17/2023 CLINICAL DATA:  Elective surgery. EXAM: DG HIP  (WITH OR WITHOUT PELVIS) 2-3V LEFT COMPARISON:  Radiograph 11/16/2023 FINDINGS: Five fluoroscopic spot views of the left hip submitted from the operating room. Imaging obtained during femoral intramedullary nail with trans trochanteric and distal locking screw fixation of proximal femur fracture. Fluoroscopy time 49 seconds. Dose 19.618 mGy. IMPRESSION: Intraoperative fluoroscopy during left proximal femur fracture ORIF. Electronically Signed   By: Andrea Gasman M.D.   On: 11/17/2023 15:10   DG C-Arm 1-60 Min-No Report Result Date: 11/17/2023 Fluoroscopy was utilized by the requesting physician.  No radiographic interpretation.   CT CHEST ABDOMEN PELVIS W CONTRAST Result Date:  11/16/2023 EXAM: CT CHEST, ABDOMEN AND PELVIS WITH CONTRAST 11/16/2023 10:39:42 AM TECHNIQUE: CT of the chest, abdomen and pelvis was performed with the administration of 75 mL of iohexol  (OMNIPAQUE ) 300 MG/ML solution. Both precontrast and postcontrast images of the chest abdomen and pelvis are provided. Multiplanar reformatted images are provided for review. Automated exposure control, iterative reconstruction, and/or weight based adjustment of the mA/kV was utilized to reduce the radiation dose to as low as reasonably achievable. COMPARISON: Chest CT 03/20/2016, chest radiographs 07:51 AM today, CT abdomen and pelvis 01/30/2014. CLINICAL HISTORY: 89 year old male with polytrauma from fall, left ribcage pain, and right forehead injury. FINDINGS: CHEST: MEDIASTINUM AND LYMPH NODES: Advanced chronic calcified coronary artery atherosclerosis calcified coronary artery plaque versus stents. A small anterior pericardial effusion with fairly simple fluid density on series 2 image 48 is larger than in 2018 and 2015 but significance is doubtful. The central airways are clear. No mediastinal, hilar or axillary lymphadenopathy. Left axillary surgical clips are new since 2018. LUNGS AND PLEURA: Mildly lower lung volumes than in 2018. Similar  mild atelectatic changes to the major airways which remain patent. Chronic elevation of the right hemidiaphragm is stable. New bilateral pulmonary mosaics attenuation, but favor a combination of gas trapping and atelectasis in this setting. No pneumothorax. No pleural effusion. No convincing pulmonary contusion. Increased lung base atelectasis, including platelike opacity over the right hemidiaphragm. ABDOMEN AND PELVIS: LIVER: The liver is unremarkable. GALLBLADDER AND BILE DUCTS: Chronic cholecystectomy. No acute biliary ductal dilatation. SPLEEN: No acute abnormality. PANCREAS: No acute abnormality. ADRENAL GLANDS: No acute abnormality. KIDNEYS, URETERS AND BLADDER: Chronic hyperdense, but benign appearing exophytic left renal upper pole cyst (series 4 image 58) which does not enhance. Per consensus, no follow-up is needed for simple Bosniak type 1 and 2 renal cysts, unless the patient has a malignancy history or risk factors. Left nephrolithiasis is chronic. No left hydronephrosis. Large solid, exophytic, and heterogeneously enhancing mass of the right renal upper pole encompassing 63 x 92 x 73 mm (AP x transverse x CC). This is new from previous exams and consistent with renal cell carcinoma see sagittal image 65 today. No superimposed right renal obstruction, and underlying chronic renal cortical scarring and volume loss. No hydroureter. Urinary bladder is unremarkable. On delayed phase images there is symmetric renal contrast excretion. GI AND BOWEL: Stomach demonstrates no acute abnormality. Abundant retained stool in the rectosigmoid colon including a 9 cm diameter rectal stool ball. Diverticulosis of the descending colon with mild to moderate retained stool. Similar retained stool in the transverse colon. Right colon less affected. No large bowel inflammation identified. Diminutive or absent appendix. Chronic surgical clips in the transverse colon mesentery and also in the distal small bowel with no  adverse features stable since 2015. Chronic postoperative changes also at the gastroesophageal junction, stable. There is no bowel obstruction. REPRODUCTIVE ORGANS: Heterogeneous prostate enlargement. PERITONEUM AND RETROPERITONEUM: No ascites. No free air. VASCULATURE: Aorta is normal in caliber. ABDOMINAL AND PELVIS LYMPH NODES: No lymphadenopathy. BONES AND SOFT TISSUES: Minimally displaced left posterolateral 10th rib fracture on series 8 image 116. Nondisplaced left posterolateral 9th rib fracture best seen on coronal image 47. Diffuse idiopathic skeletal hyperostosis. Associated widespread chronic thoracic spinal interbody ankylosis similar bulky hyperostosis and ankylosis in the partially visible lower cervical spine vertebral height appears maintained and stable. Mildly transitional lumbosacral anatomy, vestigial S1-S2 disc space chronic lumbar spine degeneration including multilevel chronic vacuum disc disease lumbar vertebral height appears stable since 2015. Comminuted proximal left femur  intertrochanteric fracture acute, with varus impaction (coronal image 78 and series 2 image 118). The left acetabulum, pubic rami, and pelvis appear to remain intact. No measurable soft tissue hematoma about the left hip. No other acute osseous abnormality is identified in the chest, abdomen, or pelvis. IMPRESSION: 1. Renal Cell Carcinoma: Large 9.2 cm solid, exophytic right upper pole renal mass is new since 2018. No metastatic disease identified. Recommend Urology consultation. 2. Acute comminuted proximal left femur intertrochanteric fracture with varus impaction. 3. Minimally displaced left posterolateral 10th rib fracture and nondisplaced left posterolateral 9th rib fracture. No pneumothorax, pulmonary contusion, or pleural effusion 4. Abundant retained stool in the rectosigmoid colon, including a 9 cm rectal stool ball. No large bowel obstruction or inflammation identified. Electronically signed by: Helayne Hurst MD  11/16/2023 11:04 AM EDT RP Workstation: [id number]   CT Cervical Spine Wo Contrast Result Date: 11/16/2023 EXAM: CT CERVICAL SPINE WITHOUT CONTRAST 11/16/2023 08:36:39 AM TECHNIQUE: CT of the cervical spine was performed without the administration of intravenous contrast. Multiplanar reformatted images are provided for review. Automated exposure control, iterative reconstruction, and/or weight based adjustment of the mA/kV was utilized to reduce the radiation dose to as low as reasonably achievable. COMPARISON: CT of the cervical spine dated 06/08/2023. CLINICAL HISTORY: Neck trauma, intoxicated or obtunded (Age >= 16y). Pt reports of fall from standing. Pt reports hitting the night stand on his left side ribcage and has pain down side and down leg. Pt reports hitting right side forehead as well. Pt denies LOC and denies taking any blood thinners. FINDINGS: CERVICAL SPINE: BONES AND ALIGNMENT: No acute fracture or traumatic malalignment. Slight degenerative anterolisthesis at C3-C4. DEGENERATIVE CHANGES: Moderate hypertrophic changes again noted within the atlantoaxial joint resulting in moderate central spinal canal stenosis at the craniocervical junction, as before. There is a posterior bridging osteophyte again demonstrated at C2-C3 also resulting in moderate central spinal canal stenosis. There are anterior bridging osteophytes extending from C4 through T2. SOFT TISSUES: No prevertebral soft tissue swelling. There is calcification within the carotid bulbs. IMPRESSION: 1. No acute abnormality of the cervical spine related to the reported trauma. 2. Moderate hypertrophic changes within the atlantoaxial joint resulting in moderate central spinal canal stenosis at the craniocervical junction, unchanged. 3. Posterior bridging osteophyte at C2-3 resulting in moderate central spinal canal stenosis, unchanged. 4. Anterior bridging osteophytes extending from C4 through T2. Electronically signed by: Evalene Coho MD  11/16/2023 09:21 AM EDT RP Workstation: [id number]   CT Head Wo Contrast Result Date: 11/16/2023 EXAM: CT HEAD WITHOUT CONTRAST 11/16/2023 08:36:39 AM TECHNIQUE: CT of the head was performed without the administration of intravenous contrast. Automated exposure control, iterative reconstruction, and/or weight based adjustment of the mA/kV was utilized to reduce the radiation dose to as low as reasonably achievable. COMPARISON: CT of the head dated 01/22/2023. CLINICAL HISTORY: Head trauma, intracranial arterial injury suspected. Pt reports of fall from standing. Pt reports hitting the night stand on his left side ribcage and has pain down side and down leg. Pt reports hitting right side forehead as well. Pt denies LOC and denies taking any blood thinners. FINDINGS: BRAIN AND VENTRICLES: No acute hemorrhage. No evidence of acute infarct. No hydrocephalus. No extra-axial collection. No mass effect or midline shift. There is age-related volume loss and mild periventricular white matter disease. There is moderate calcific atheromatous disease within the carotid siphons and vertebral arteries. ORBITS: Patient is status post right lens replacement. SINUSES: No acute abnormality. SOFT TISSUES AND SKULL: No acute soft tissue  abnormality. No skull fracture. IMPRESSION: 1. No acute intracranial abnormality. 2. Age-related cerebral volume loss and mild periventricular white matter changes. Electronically signed by: Evalene Coho MD 11/16/2023 08:56 AM EDT RP Workstation: [id number]   DG Chest 2 View Result Date: 11/16/2023 CLINICAL DATA:  Fall, left hip pain.  Left femur fracture. EXAM: CHEST - 2 VIEW COMPARISON:  09/29/2023. FINDINGS: Trachea is midline. Heart size stable. Thoracic aorta is calcified. Linear volume loss in the right lung base adjacent to an elevated right hemidiaphragm. Linear atelectasis or scarring in the lingula. No airspace consolidation or pleural fluid. Degenerative changes in the shoulders.  IMPRESSION: No acute findings. Electronically Signed   By: Newell Eke M.D.   On: 11/16/2023 08:31   DG Hip Unilat W or Wo Pelvis 2-3 Views Left Result Date: 11/16/2023 CLINICAL DATA:  Fall, left hip pain. EXAM: DG HIP (WITH OR WITHOUT PELVIS) 2-3V LEFT COMPARISON:  None Available. FINDINGS: Mildly impacted intertrochanteric left femur fracture with varus angulation. No dislocation. IMPRESSION: Left intertrochanteric femur fracture. Electronically Signed   By: Newell Eke M.D.   On: 11/16/2023 08:30   (Echo, Carotid, EGD, Colonoscopy, ERCP)    Subjective: Patient seen and examined.  Moderate pain with attempted mobility.  Agreeable to transfer to a SNF. Patient's son called and updated.   Discharge Exam: Vitals:   11/24/23 0840 11/24/23 1148  BP: (!) 162/71 137/61  Pulse: 79 77  Resp: 18 15  Temp:  98.8 F (37.1 C)  SpO2: 92% 92%   Vitals:   11/23/23 2050 11/24/23 0546 11/24/23 0840 11/24/23 1148  BP: 138/63 (!) 158/76 (!) 162/71 137/61  Pulse: 74 70 79 77  Resp: 18 (!) 24 18 15   Temp: 98.6 F (37 C) 98.5 F (36.9 C)  98.8 F (37.1 C)  TempSrc: Oral Oral    SpO2: 94% 96% 92% 92%  Weight:      Height:        General: Pt is alert, awake, not in acute distress.  Frail and debilitated. Cardiovascular: RRR, S1/S2 +, no rubs, no gallops Respiratory: CTA bilaterally, no wheezing, no rhonchi Abdominal: Soft, NT, ND, bowel sounds + Extremities: no edema, no cyanosis Large ecchymotic area, bruising left flank extending to left anterior abdominal wall.  Mild swelling present. Mild swelling left hip.  Incisions intact.    The results of significant diagnostics from this hospitalization (including imaging, microbiology, ancillary and laboratory) are listed below for reference.     Microbiology: No results found for this or any previous visit (from the past 240 hours).   Labs: BNP (last 3 results) Recent Labs    01/22/23 1349 04/08/23 2225 06/08/23 1428  BNP  247.4* 618.2* 872.4*   Basic Metabolic Panel: Recent Labs  Lab 11/18/23 0602 11/20/23 0648 11/21/23 1248 11/22/23 0639  NA 138 134* 134* 134*  K 3.7 4.0 4.0 4.6  CL 104 101 100 100  CO2 23 25 24 24   GLUCOSE 214* 237* 248* 235*  BUN 39* 39* 29* 26*  CREATININE 1.61* 1.20 1.09 1.02  CALCIUM  9.3 9.3 9.5 9.6  MG  --   --  2.2 2.0  PHOS  --   --  2.7 2.7   Liver Function Tests: Recent Labs  Lab 11/21/23 1248 11/22/23 0639  ALBUMIN 3.1* 3.3*   No results for input(s): LIPASE, AMYLASE in the last 168 hours. No results for input(s): AMMONIA in the last 168 hours. CBC: Recent Labs  Lab 11/18/23 0602 11/20/23 9351 11/21/23 1248 11/22/23 0729  WBC 12.4* 8.9 7.8 9.4  HGB 9.8* 9.7* 9.8* 10.3*  HCT 30.7* 30.3* 31.2* 31.7*  MCV 97.2 95.6 95.1 94.6  PLT 221 252 310 347   Cardiac Enzymes: No results for input(s): CKTOTAL, CKMB, CKMBINDEX, TROPONINI in the last 168 hours. BNP: Invalid input(s): POCBNP CBG: Recent Labs  Lab 11/23/23 1129 11/23/23 1640 11/23/23 2102 11/24/23 0723 11/24/23 1145  GLUCAP 242* 227* 173* 235* 233*   D-Dimer No results for input(s): DDIMER in the last 72 hours. Hgb A1c No results for input(s): HGBA1C in the last 72 hours. Lipid Profile No results for input(s): CHOL, HDL, LDLCALC, TRIG, CHOLHDL, LDLDIRECT in the last 72 hours. Thyroid  function studies No results for input(s): TSH, T4TOTAL, T3FREE, THYROIDAB in the last 72 hours.  Invalid input(s): FREET3 Anemia work up Recent Labs    11/21/23 1248  VITAMINB12 559  FOLATE 11.8  FERRITIN 86  TIBC 238*  IRON 30*  RETICCTPCT 3.0   Urinalysis    Component Value Date/Time   COLORURINE STRAW (A) 06/08/2023 1448   APPEARANCEUR CLEAR 06/08/2023 1448   LABSPEC 1.009 06/08/2023 1448   PHURINE 8.0 06/08/2023 1448   GLUCOSEU NEGATIVE 06/08/2023 1448   HGBUR SMALL (A) 06/08/2023 1448   BILIRUBINUR NEGATIVE 06/08/2023 1448   KETONESUR NEGATIVE  06/08/2023 1448   PROTEINUR 100 (A) 06/08/2023 1448   UROBILINOGEN 0.2 02/02/2014 1509   NITRITE NEGATIVE 06/08/2023 1448   LEUKOCYTESUR SMALL (A) 06/08/2023 1448   Sepsis Labs Recent Labs  Lab 11/18/23 0602 11/20/23 0648 11/21/23 1248 11/22/23 0729  WBC 12.4* 8.9 7.8 9.4   Microbiology No results found for this or any previous visit (from the past 240 hours).   Time coordinating discharge: 40 minutes  SIGNED:   Renato Applebaum, MD  [facility] 11/24/2023, 11:56 AM

## 2023-11-25 DIAGNOSIS — S2232XD Fracture of one rib, left side, subsequent encounter for fracture with routine healing: Secondary | ICD-10-CM | POA: Diagnosis not present

## 2023-11-25 DIAGNOSIS — S72002D Fracture of unspecified part of neck of left femur, subsequent encounter for closed fracture with routine healing: Secondary | ICD-10-CM | POA: Diagnosis not present

## 2023-11-25 DIAGNOSIS — I10 Essential (primary) hypertension: Secondary | ICD-10-CM | POA: Diagnosis not present

## 2023-11-25 DIAGNOSIS — I5042 Chronic combined systolic (congestive) and diastolic (congestive) heart failure: Secondary | ICD-10-CM | POA: Diagnosis not present

## 2023-11-25 DIAGNOSIS — Z9181 History of falling: Secondary | ICD-10-CM | POA: Diagnosis not present

## 2023-11-25 DIAGNOSIS — Z4789 Encounter for other orthopedic aftercare: Secondary | ICD-10-CM | POA: Diagnosis not present

## 2023-11-25 DIAGNOSIS — S72002A Fracture of unspecified part of neck of left femur, initial encounter for closed fracture: Secondary | ICD-10-CM | POA: Diagnosis not present

## 2023-11-26 ENCOUNTER — Telehealth: Payer: Self-pay | Admitting: Family Medicine

## 2023-11-26 DIAGNOSIS — Z659 Problem related to unspecified psychosocial circumstances: Secondary | ICD-10-CM

## 2023-11-26 NOTE — Telephone Encounter (Signed)
 I can't arrange the transfer but I put in a SW referral.  Please have him contact [facility] and see if they can help with the transfer.  Thanks.
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
## 2023-11-27 ENCOUNTER — Telehealth: Payer: Self-pay

## 2023-11-27 DIAGNOSIS — Z4789 Encounter for other orthopedic aftercare: Secondary | ICD-10-CM | POA: Diagnosis not present

## 2023-11-27 DIAGNOSIS — I2510 Atherosclerotic heart disease of native coronary artery without angina pectoris: Secondary | ICD-10-CM | POA: Diagnosis not present

## 2023-11-27 DIAGNOSIS — M25552 Pain in left hip: Secondary | ICD-10-CM | POA: Diagnosis not present

## 2023-11-27 NOTE — Progress Notes (Signed)
 Complex Care Management Note  Care Guide Note 11/27/2023 Name: [name] MRN: [id number] DOB: 12-16-1934  [name] is a 89 y.o. year old male who sees [name], MD for primary care. I reached out to Lamar FORBES Kapur by phone today to offer complex care management services.  [name] was given information about Complex Care Management services today including:   The Complex Care Management services include support from the care team which includes your Nurse Care Manager, Clinical Social Worker, or Pharmacist.  The Complex Care Management team is here to help remove barriers to the health concerns and goals most important to you. Complex Care Management services are voluntary, and the patient may decline or stop services at any time by request to their care team member.   Complex Care Management Consent Status: Patient did not agree to participate in complex care management services at this time.  Follow up plan:  Son will follow up with patients PCP.  Encounter Outcome:  Patient Refused  Dreama Agent [facility], [facility] Assistant Direct Dial : [phone]  Fax: [phone]

## 2023-11-30 ENCOUNTER — Other Ambulatory Visit: Payer: Self-pay | Admitting: Family Medicine

## 2023-11-30 DIAGNOSIS — I152 Hypertension secondary to endocrine disorders: Secondary | ICD-10-CM

## 2023-11-30 NOTE — Telephone Encounter (Signed)
 Spoke with pt's son, [name] (on dpr) relaying Dr Elfredia message. [name] verbalizes understanding, expresses his sincere thanks for all the help and mentioned he spoke with [facility] earlier this AM.

## 2023-12-01 NOTE — Telephone Encounter (Signed)
 Amlodipine  Last filled:  09/17/23 Last OV:  10/23/23, dizziness Next OV:  none

## 2023-12-02 MED ORDER — AMLODIPINE BESYLATE 5 MG PO TABS: 5.0000 mg | ORAL_TABLET | Freq: Every morning | ORAL | 1 refills | Status: DC

## 2023-12-02 NOTE — Telephone Encounter (Signed)
 Please check with patient's son about amlodipine  dose.  The refill request came in for 10 mg pills and I initially sent that but then I realized he had been taking 5 mg.  I resent it for 5 mg.  Please get update on patient.  Thanks.

## 2023-12-03 DIAGNOSIS — T148XXD Other injury of unspecified body region, subsequent encounter: Secondary | ICD-10-CM | POA: Diagnosis not present

## 2023-12-03 DIAGNOSIS — S72002D Fracture of unspecified part of neck of left femur, subsequent encounter for closed fracture with routine healing: Secondary | ICD-10-CM | POA: Diagnosis not present

## 2023-12-03 DIAGNOSIS — R269 Unspecified abnormalities of gait and mobility: Secondary | ICD-10-CM | POA: Diagnosis not present

## 2023-12-03 DIAGNOSIS — N183 Chronic kidney disease, stage 3 unspecified: Secondary | ICD-10-CM | POA: Diagnosis not present

## 2023-12-04 NOTE — Telephone Encounter (Signed)
 Spoke with pt's son, [name] (on dpr), relaying Dr Elfredia message. [name] verbalizes understanding. States pt is currently in rehab in [place] and is doing pretty good, all things considered.

## 2023-12-04 NOTE — Telephone Encounter (Signed)
 Noted. Thanks.

## 2023-12-13 ENCOUNTER — Other Ambulatory Visit: Payer: Self-pay | Admitting: Family Medicine

## 2023-12-15 ENCOUNTER — Telehealth: Payer: Self-pay

## 2023-12-15 NOTE — Transitions of Care (Post Inpatient/ED Visit) (Signed)
 12/15/2023  Name: [name] MRN: [id number] DOB: 11/20/1934  Today's TOC FU Call Status: Today's TOC FU Call Status:: Successful TOC FU Call Completed TOC FU Call Complete Date: 12/15/23 Patient's Name and Date of Birth confirmed.  Transition Care Management Follow-up Telephone Call Date of Discharge: 12/14/23 Discharge Facility: Other [facility]) Name of Other (Non[facility]) Discharge Facility: [name] Type of Discharge: Inpatient Admission Primary Inpatient Discharge Diagnosis:: fracture left femur How have you been since you were released from the hospital?: Better Any questions or concerns?: No  Items Reviewed: Did you receive and understand the discharge instructions provided?: Yes Medications obtained,verified, and reconciled?: Yes (Medications Reviewed) Any new allergies since your discharge?: No Dietary orders reviewed?: Yes Do you have support at home?: Yes People in Home [RPT]: child(ren), adult  Medications Reviewed Today: Medications Reviewed Today     Reviewed by Emmitt Pan, LPN (Licensed Practical Nurse) on 12/15/23 at [phone]  Med List Status: <None>   Medication Order Taking? Sig Documenting Provider Last Dose Status Informant  Accu-Chek Softclix Lancets lancets [id number] Yes TEST BLOOD SUGAR UP TO THREE TIMES DAILY [name], MD  Active Self, Pharmacy Records  amLODipine  (NORVASC ) 5 MG tablet [id number] Yes Take 1 tablet (5 mg total) by mouth every morning. [name], MD  Active   aspirin  EC 81 MG tablet [id number] Yes Take 1 tablet (81 mg total) by mouth 2 (two) times daily. Raenelle Coria, MD  Active   Blood Glucose Calibration (ACCU-CHEK AVIVA) SOLN [id number] Yes Use as directed monthly. E11.59  Insulin  dependent [name], MD  Active Self, Pharmacy Records  Blood Glucose Monitoring Suppl [facility] BLOOD GLUCOSE SYSTEM) w/Device KIT [id number] Yes 1 each by Does not apply route 3 (three) times daily. [name], MD  Active  Self, Pharmacy Records  carvedilol  (COREG ) 12.5 MG tablet [id number] Yes TAKE 1 TABLET BY MOUTH TWICE  DAILY WITH MEALS [name], MD  Active Self, Pharmacy Records  COD LIVER OIL PO [id number] Yes Take 1 capsule by mouth 2 (two) times daily.  [provider]  Active Self, Pharmacy Records  cyanocobalamin  (VITAMIN B12) 1000 MCG tablet [id number] Yes Take 1 tablet (1,000 mcg total) by mouth daily. [name], MD  Active Self, Pharmacy Records  diclofenac  Sodium (VOLTAREN ) 1 % GEL [id number] Yes Apply 2 g topically 4 (four) times daily. Vann, Jessica U, DO  Active Self, Pharmacy Records  DROPLET PEN NEEDLES 30G X 8 MM MISC [id number] Yes USE  WITH  INSULIN   PEN [name], MD  Active Self, Pharmacy Records  empagliflozin  (JARDIANCE ) 10 MG TABS tablet [id number] Yes Take 1 tablet (10 mg total) by mouth daily before breakfast. Bill secondary: BIN [id number], PCN [id number], GRP [id number], ID [id number] [name], MD  Active Self, Pharmacy Records  escitalopram  (LEXAPRO ) 20 MG tablet [id number] Yes Take 1 tablet (20 mg total) by mouth daily. [name], MD  Active Self, Pharmacy Records  furosemide  (LASIX ) 40 MG tablet [id number] Yes Take 1 tablet (40 mg total) by mouth daily. Can take an additional 20mg  as needed for weight gain of 2 lbs in a day or 5lbs in a week Wyn Jackee VEAR Raddle., NP  Active Self, Pharmacy Records  glucose blood (CONTOUR TEST) test strip [id number] Yes Use as instructed [name], MD  Active Self, Pharmacy Records  glucose blood [facility] ULTRA) test strip [id number] Yes USE TO CHECK UP TO 3 TIMES DAILY AS NEEDED [name], MD  Active Self, Pharmacy Records  hydrALAZINE  (APRESOLINE ) 50 MG tablet [id number] Yes TAKE 1 TABLET BY MOUTH 3 TIMES  DAILY [name], MD  Active Self, Pharmacy Records  insulin  lispro (HUMALOG  KWIKPEN) 100 UNIT/ML KwikPen [id number] Yes INJECT SUBCUTANEOUSLY 4 TO 10  UNITS 3 TIMES DAILY BEFORE MEALS PER SLIDING SCALE: 151-200  = 4  UN, 201-250 = 6 UN, 251-300 = 8  UN, AND OVER 300 = 10 ROLANDA [name], MD  Active Self, Pharmacy Records  losartan  (COZAAR ) 25 MG tablet [id number] Yes Take 1 tablet (25 mg total) by mouth daily. Vernon Ranks, MD  Active Self, Pharmacy Records  meclizine  (ANTIVERT ) 12.5 MG tablet [id number] Yes Take 1 tablet (12.5 mg total) by mouth 3 (three) times daily as needed for dizziness. [name], MD  Active Self, Pharmacy Records  methocarbamol  (ROBAXIN ) 500 MG tablet [id number] Yes Take 0.5 tablets (250 mg total) by mouth 4 (four) times daily. Raenelle Coria, MD  Active   ondansetron  (ZOFRAN ) 4 MG tablet [id number] Yes Take 1 tablet (4 mg total) by mouth every 6 (six) hours. Kingsley, Victoria K, DO  Active Self, Pharmacy Records  oxyCODONE  (OXY IR/ROXICODONE ) 5 MG immediate release tablet [id number] Yes Take 1 tablet (5 mg total) by mouth every 4 (four) hours as needed for moderate pain (pain score 4-6) or severe pain (pain score 7-10). Ghimire, Kuber, MD  Active   polyethylene glycol (MIRALAX  / GLYCOLAX ) 17 g packet [id number] Yes Take 17 g by mouth 2 (two) times daily. Ghimire, Kuber, MD  Active   potassium chloride  SA (KLOR-CON  M) 20 MEQ tablet [id number] Yes Take 2 tablets (40 mEq total) by mouth daily. Vann, Jessica U, DO  Active Self, Pharmacy Records  senna-docusate (SENOKOT-S) 8.6-50 MG tablet [id number] Yes Take 1 tablet by mouth 2 (two) times daily. Raenelle Coria, MD  Active   tamsulosin  (FLOMAX ) 0.4 MG CAPS capsule [id number] Yes TAKE 1 CAPSULE BY MOUTH DAILY  AFTER SUPPER  Patient taking differently: Take 0.4 mg by mouth daily. TAKE 1 CAPSULE BY MOUTH  DAILY AFTER SUPPER   [name], MD  Active Self, Pharmacy Records  [name]  SOLOSTAR 300 UNIT/ML Solostar Pen [id number] Yes INJECT SUBCUTANEOUSLY 16 UNITS  AT BEDTIME  Patient taking differently: Inject 16 Units into the skin at bedtime.   [name], MD  Active Self, Pharmacy Records  traMADol  (ULTRAM ) 50 MG tablet [id number]  Yes TAKE 1 TABLET BY MOUTH EVERY 12 HOURS AS NEEDED FOR KNEE PAIN.  Patient taking differently: Take 50 mg by mouth every 12 (twelve) hours as needed for moderate pain (pain score 4-6) or severe pain (pain score 7-10). TAKE 1 TABLET BY MOUTH EVERY 12 HOURS AS NEEDED FOR KNEE PAIN.   [name], MD  Active Self, Pharmacy Records            Home Care and Equipment/Supplies: Were Home Health Services Ordered?: Yes Name of Home Health Agency:: unknown Has Agency set up a time to come to your home?: No Any new equipment or medical supplies ordered?: NA  Functional Questionnaire: Do you need assistance with bathing/showering or dressing?: No Do you need assistance with meal preparation?: No Do you need assistance with eating?: No Do you have difficulty maintaining continence: No Do you need assistance with getting out of bed/getting out of a chair/moving?: No Do you have difficulty managing or taking your medications?: No  Follow up appointments reviewed: PCP Follow-up appointment confirmed?: Yes Date of PCP follow-up appointment?: 12/18/23  Follow-up Provider: [facility] Follow-up appointment confirmed?: NA Do you need transportation to your follow-up appointment?: No Do you understand care options if your condition(s) worsen?: Yes-patient verbalized understanding    SIGNATURE Julian Lemmings, LPN [facility] Nurse Health Advisor Direct Dial  [phone]

## 2023-12-18 ENCOUNTER — Ambulatory Visit: Admitting: Family Medicine

## 2023-12-18 ENCOUNTER — Telehealth: Payer: Self-pay

## 2023-12-18 VITALS — BP 132/60 | HR 73 | Resp 18 | Ht 71.0 in | Wt 208.0 lb

## 2023-12-18 DIAGNOSIS — D62 Acute posthemorrhagic anemia: Secondary | ICD-10-CM

## 2023-12-18 DIAGNOSIS — S72109D Unspecified trochanteric fracture of unspecified femur, subsequent encounter for closed fracture with routine healing: Secondary | ICD-10-CM

## 2023-12-18 DIAGNOSIS — N2889 Other specified disorders of kidney and ureter: Secondary | ICD-10-CM

## 2023-12-18 LAB — BASIC METABOLIC PANEL WITH GFR
BUN: 24 mg/dL — ABNORMAL HIGH (ref 6–23)
CO2: 31 meq/L (ref 19–32)
Calcium: 10.2 mg/dL (ref 8.4–10.5)
Chloride: 98 meq/L (ref 96–112)
Creatinine, Ser: 1.45 mg/dL (ref 0.40–1.50)
GFR: 43.49 mL/min — ABNORMAL LOW (ref >60.00)
Glucose, Bld: 237 mg/dL — ABNORMAL HIGH (ref 70–99)
Potassium: 4.5 meq/L (ref 3.5–5.1)
Sodium: 138 meq/L (ref 135–145)

## 2023-12-18 LAB — CBC WITH DIFFERENTIAL/PLATELET
Basophils Absolute: 0 K/uL (ref 0.0–0.1)
Basophils Relative: 0.7 % (ref 0.0–3.0)
Eosinophils Absolute: 0.3 K/uL (ref 0.0–0.7)
Eosinophils Relative: 4.8 % (ref 0.0–5.0)
HCT: 43.7 % (ref 39.0–52.0)
Hemoglobin: 14.3 g/dL (ref 13.0–17.0)
Lymphocytes Relative: 31 % (ref 12.0–46.0)
Lymphs Abs: 1.8 K/uL (ref 0.7–4.0)
MCHC: 32.7 g/dL (ref 30.0–36.0)
MCV: 94.9 fl (ref 78.0–100.0)
Monocytes Absolute: 0.5 K/uL (ref 0.1–1.0)
Monocytes Relative: 8.3 % (ref 3.0–12.0)
Neutro Abs: 3.1 K/uL (ref 1.4–7.7)
Neutrophils Relative %: 55.2 % (ref 43.0–77.0)
Platelets: 304 K/uL (ref 150.0–400.0)
RBC: 4.61 Mil/uL (ref 4.22–5.81)
RDW: 16.4 % — ABNORMAL HIGH (ref 11.5–15.5)
WBC: 5.7 K/uL (ref 4.0–10.5)

## 2023-12-18 MED ORDER — OXYCODONE HCL 5 MG PO TABS: 5.0000 mg | ORAL_TABLET | Freq: Four times a day (QID) | ORAL | 0 refills | Status: DC | PRN

## 2023-12-18 NOTE — Progress Notes (Signed)
  Admit date: 11/16/2023 Discharge date: 11/24/2023   Admitted From: Home Disposition: Skilled nursing facility   Recommendations for Outpatient Follow-up:  Follow up with PCP in 1-2 weeks Please obtain BMP/CBC in one week Orthopedics to schedule follow-up Send referral to urology to schedule follow-up   Home Health: N/A Equipment/Devices: N/A   Discharge Condition: Fair CODE STATUS: Full code Diet recommendation: Low-salt and low-carb diet   Discharge summary: 89 year old with history of anxiety, coronary artery disease, hyperlipidemia, type 2 diabetes on insulin , nephrolithiasis, BPH fell at home on his left hip.  Found to have intertrochanteric fracture left hip.  CT scan abdomen pelvis positive for 9.2 cm exophytic right upper lobe renal mass and also significant constipation.  Also nondisplaced fracture of the left side 9th and 10th rib.  Has significant bruising left flank.   Underwent IM nailing 10/7.  Therapy recommended SNF.  Treated for following conditions.    Assessment & Plan: Closed traumatic intertrochanteric fracture of the left hip. -ORIF IM nailing 10/7-Dr. Sherida -Weightbearing as tolerated -Pain management. Bowel regimen.  Pain management with oxycodone , Tylenol , muscle relaxants.  High risk of constipation, keep on a scheduled Senokot and MiraLAX . -SCD for VTE prophylaxis.  Lovenox  while in the hospital.  Aspirin  81 mg twice daily for 6 weeks on discharge. -Outpatient follow-up with orthopedics.   - Continue inpatient therapies with PT OT. Patient on chronic pain management with tramadol , will hold until using oxycodone  postop.   ABLA due to large abdominal wall hematoma: CT on 10/10 showed 3.4 x 6.5 x 6.5 cm intramuscular hematoma along the left mid back and additional 1.6 x 3.0 cm hematoma over left gluteal region. Hemoglobin dropped as anticipated from 13-10.  Currently does not have evidence of ongoing bleeding.  Restarted on Lovenox .  Discharging on  aspirin . Please recheck hemoglobin every week to ensure stabilization.   Nondisplaced fracture of the left side 9th and 10th rib. - Incentive spirometry and pain control. - Robaxin .  Continue oxycodone  as needed.  Mobilize.   Right renal mass: Incidental finding of right renal mass on CT scan.  Concerning for malignancy. -Patient and his son updated by previous provider -Message was sent to Dr. Renda for follow-up.   Severe constipation: Resolved with bowel regimen.  LBM 10/10. -Continue bowel regimen   IDDM-2 with hyperglycemia: A1c 8.8% on 8/19.  ========================== Inpatient course discussed with patient. Still dealing with L leg pain, now back pain.  Still having left pain on range of motion and his left ribs are sore.  He had been weaning from oxycodone  down to tramadol .  Cautions d/w pt.    HH PT is coming out today.   Bowel regimen d/w pt.  Had a BM today.    No fevers.  Discussed inpatient imaging with abnormal renal findings and the rationale for urology follow-up/MRI.  Recheck labs pending regarding anemia.  Meds, vitals, and allergies reviewed.   ROS: Per HPI unless specifically indicated in ROS section   Nad Ncat Neck supple, no LA Rrr Ctab Abd soft not ttp Left ribs sore to palpation. Pain on left hip range of motion as expected with bruise noted on the medial left thigh. Skin well-perfused. Not sedated. Mentation at baseline.

## 2023-12-18 NOTE — Patient Instructions (Signed)
 Go to the lab on the way out.   If you have mychart we'll likely use that to update you.    Take care.  Glad to see you.

## 2023-12-18 NOTE — Telephone Encounter (Signed)
 Would continue both- okay to use.  Thanks.

## 2023-12-18 NOTE — Telephone Encounter (Signed)
 VO given.

## 2023-12-18 NOTE — Telephone Encounter (Signed)
 Copied from CRM #[id number]. Topic: Clinical - Home Health Verbal Orders >> Dec 18, 2023  3:24 PM Berneda FALCON wrote: Caller/Agency: Lorrene PT with [facility] Callback Number: [phone] Service Requested: Physical Therapy Frequency: 1W1, 2w2, 1W1 Any new concerns about the patient? Yes, patient is showing level 2 interaction between meclizine  (ANTIVERT ) 12.5 MG tablet and potassium chloride  SA (KLOR-CON  M) 20 MEQ tablet and would like to know what PCP would like to do.  Otherwise no new concerns

## 2023-12-20 ENCOUNTER — Ambulatory Visit: Payer: Self-pay | Admitting: Family Medicine

## 2023-12-20 DIAGNOSIS — D62 Acute posthemorrhagic anemia: Secondary | ICD-10-CM | POA: Insufficient documentation

## 2023-12-20 DIAGNOSIS — N2889 Other specified disorders of kidney and ureter: Secondary | ICD-10-CM

## 2023-12-20 DIAGNOSIS — S72109A Unspecified trochanteric fracture of unspecified femur, initial encounter for closed fracture: Secondary | ICD-10-CM | POA: Insufficient documentation

## 2023-12-20 NOTE — Assessment & Plan Note (Signed)
 See notes on labs.

## 2023-12-20 NOTE — Assessment & Plan Note (Signed)
 Discussed inpatient imaging with abnormal renal findings and the rationale for urology follow-up/MRI.

## 2023-12-20 NOTE — Assessment & Plan Note (Signed)
 History of, with PT follow-up pending.  Routine cautions discussed with patient.  Can taper from oxycodone  down to tramadol  and update me as needed.

## 2023-12-24 ENCOUNTER — Ambulatory Visit: Admitting: Family Medicine

## 2023-12-29 ENCOUNTER — Other Ambulatory Visit: Payer: Self-pay | Admitting: Family Medicine

## 2023-12-29 NOTE — Telephone Encounter (Signed)
 Copied from CRM [id number]. Topic: Clinical - Medication Refill >> Dec 29, 2023 12:52 PM Aleatha C wrote: Medication: oxyCODONE  (OXY IR/ROXICODONE ) 5 MG immediate release table  Has the patient contacted their pharmacy? No (Agent: If no, request that the patient contact the pharmacy for the refill. If patient does not wish to contact the pharmacy document the reason why and proceed with request.) (Agent: If yes, when and what did the pharmacy advise?)  This is the patient's preferred pharmacy:  [facility] - [place], [place] - [address] [place] [address] Phone: [phone] Fax: [phone]     Is this the correct pharmacy for this prescription? Yes If no, delete pharmacy and type the correct one.   Has the prescription been filled recently? No  Is the patient out of the medication? No 3 left  Has the patient been seen for an appointment in the last year OR does the patient have an upcoming appointment? Yes  Can we respond through MyChart? No  Agent: Please be advised that Rx refills may take up to 3 business days. We ask that you follow-up with your pharmacy.

## 2024-01-01 ENCOUNTER — Emergency Department (HOSPITAL_COMMUNITY)

## 2024-01-01 ENCOUNTER — Ambulatory Visit
Admission: RE | Admit: 2024-01-01 | Discharge: 2024-01-01 | Disposition: A | Discharge status: Home / Self Care | Source: Ambulatory Visit | Attending: Family Medicine | Admitting: Family Medicine

## 2024-01-01 ENCOUNTER — Inpatient Hospital Stay (HOSPITAL_COMMUNITY)
Admission: EM | Admit: 2024-01-01 | Discharge: 2024-01-08 | DRG: 498 | Disposition: A | Discharge status: Skilled Nursing Facility | Attending: Internal Medicine | Admitting: Internal Medicine

## 2024-01-01 ENCOUNTER — Other Ambulatory Visit: Payer: Self-pay

## 2024-01-01 DIAGNOSIS — I2510 Atherosclerotic heart disease of native coronary artery without angina pectoris: Secondary | ICD-10-CM | POA: Diagnosis present

## 2024-01-01 DIAGNOSIS — I110 Hypertensive heart disease with heart failure: Secondary | ICD-10-CM | POA: Diagnosis present

## 2024-01-01 DIAGNOSIS — E1159 Type 2 diabetes mellitus with other circulatory complications: Secondary | ICD-10-CM

## 2024-01-01 DIAGNOSIS — N138 Other obstructive and reflux uropathy: Secondary | ICD-10-CM | POA: Diagnosis present

## 2024-01-01 DIAGNOSIS — S72142A Displaced intertrochanteric fracture of left femur, initial encounter for closed fracture: Principal | ICD-10-CM | POA: Diagnosis present

## 2024-01-01 DIAGNOSIS — Z79899 Other long term (current) drug therapy: Secondary | ICD-10-CM

## 2024-01-01 DIAGNOSIS — Z8249 Family history of ischemic heart disease and other diseases of the circulatory system: Secondary | ICD-10-CM

## 2024-01-01 DIAGNOSIS — Z7982 Long term (current) use of aspirin: Secondary | ICD-10-CM

## 2024-01-01 DIAGNOSIS — Z85820 Personal history of malignant melanoma of skin: Secondary | ICD-10-CM

## 2024-01-01 DIAGNOSIS — E785 Hyperlipidemia, unspecified: Secondary | ICD-10-CM | POA: Diagnosis present

## 2024-01-01 DIAGNOSIS — S72002A Fracture of unspecified part of neck of left femur, initial encounter for closed fracture: Secondary | ICD-10-CM | POA: Diagnosis present

## 2024-01-01 DIAGNOSIS — M9702XA Periprosthetic fracture around internal prosthetic left hip joint, initial encounter: Secondary | ICD-10-CM | POA: Diagnosis present

## 2024-01-01 DIAGNOSIS — Z7901 Long term (current) use of anticoagulants: Secondary | ICD-10-CM

## 2024-01-01 DIAGNOSIS — I5042 Chronic combined systolic (congestive) and diastolic (congestive) heart failure: Secondary | ICD-10-CM | POA: Diagnosis present

## 2024-01-01 DIAGNOSIS — N401 Enlarged prostate with lower urinary tract symptoms: Secondary | ICD-10-CM | POA: Diagnosis present

## 2024-01-01 DIAGNOSIS — Y92009 Unspecified place in unspecified non-institutional (private) residence as the place of occurrence of the external cause: Secondary | ICD-10-CM

## 2024-01-01 DIAGNOSIS — Z860100 Personal history of colon polyps, unspecified: Secondary | ICD-10-CM

## 2024-01-01 DIAGNOSIS — Z794 Long term (current) use of insulin: Secondary | ICD-10-CM

## 2024-01-01 DIAGNOSIS — Z885 Allergy status to narcotic agent status: Secondary | ICD-10-CM

## 2024-01-01 DIAGNOSIS — L89322 Pressure ulcer of left buttock, stage 2: Secondary | ICD-10-CM | POA: Diagnosis present

## 2024-01-01 DIAGNOSIS — Z7984 Long term (current) use of oral hypoglycemic drugs: Secondary | ICD-10-CM

## 2024-01-01 DIAGNOSIS — M978XXA Periprosthetic fracture around other internal prosthetic joint, initial encounter: Principal | ICD-10-CM

## 2024-01-01 DIAGNOSIS — N2889 Other specified disorders of kidney and ureter: Secondary | ICD-10-CM | POA: Diagnosis present

## 2024-01-01 DIAGNOSIS — Z888 Allergy status to other drugs, medicaments and biological substances status: Secondary | ICD-10-CM

## 2024-01-01 DIAGNOSIS — E119 Type 2 diabetes mellitus without complications: Secondary | ICD-10-CM | POA: Diagnosis present

## 2024-01-01 DIAGNOSIS — D62 Acute posthemorrhagic anemia: Secondary | ICD-10-CM | POA: Diagnosis not present

## 2024-01-01 DIAGNOSIS — Z751 Person awaiting admission to adequate facility elsewhere: Secondary | ICD-10-CM

## 2024-01-01 DIAGNOSIS — Z820 Family history of epilepsy and other diseases of the nervous system: Secondary | ICD-10-CM

## 2024-01-01 DIAGNOSIS — W1830XA Fall on same level, unspecified, initial encounter: Secondary | ICD-10-CM | POA: Diagnosis present

## 2024-01-01 LAB — BASIC METABOLIC PANEL WITH GFR
Anion gap: 11 (ref 5–15)
BUN: 26 mg/dL — ABNORMAL HIGH (ref 8–23)
CO2: 24 mmol/L (ref 22–32)
Calcium: 9.5 mg/dL (ref 8.9–10.3)
Chloride: 100 mmol/L (ref 98–111)
Creatinine, Ser: 1.4 mg/dL — ABNORMAL HIGH (ref 0.61–1.24)
GFR, Estimated: 49 mL/min — ABNORMAL LOW (ref >60)
Glucose, Bld: 242 mg/dL — ABNORMAL HIGH (ref 70–99)
Potassium: 3.5 mmol/L (ref 3.5–5.1)
Sodium: 135 mmol/L (ref 135–145)

## 2024-01-01 LAB — CBC WITH DIFFERENTIAL/PLATELET
Abs Immature Granulocytes: 0.05 K/uL (ref 0.00–0.07)
Basophils Absolute: 0.1 K/uL (ref 0.0–0.1)
Basophils Relative: 1 %
Eosinophils Absolute: 0.2 K/uL (ref 0.0–0.5)
Eosinophils Relative: 2 %
HCT: 41 % (ref 39.0–52.0)
Hemoglobin: 13.7 g/dL (ref 13.0–17.0)
Immature Granulocytes: 1 %
Lymphocytes Relative: 29 %
Lymphs Abs: 2.6 K/uL (ref 0.7–4.0)
MCH: 31 pg (ref 26.0–34.0)
MCHC: 33.4 g/dL (ref 30.0–36.0)
MCV: 92.8 fL (ref 80.0–100.0)
Monocytes Absolute: 0.6 K/uL (ref 0.1–1.0)
Monocytes Relative: 6 %
Neutro Abs: 5.6 K/uL (ref 1.7–7.7)
Neutrophils Relative %: 61 %
Platelets: 333 K/uL (ref 150–400)
RBC: 4.42 MIL/uL (ref 4.22–5.81)
RDW: 14 % (ref 11.5–15.5)
WBC: 9 K/uL (ref 4.0–10.5)
nRBC: 0 % (ref 0.0–0.2)

## 2024-01-01 LAB — TYPE AND SCREEN
ABO/RH(D): O POS
Antibody Screen: NEGATIVE

## 2024-01-01 LAB — PROTIME-INR
INR: 0.9 (ref 0.8–1.2)
Prothrombin Time: 13.2 s (ref 11.4–15.2)

## 2024-01-01 MED ORDER — HYDROMORPHONE HCL 1 MG/ML IJ SOLN
1.0000 mg | Freq: Once | INTRAMUSCULAR | Status: AC
  Administered 2024-01-02: 1 mg via INTRAVENOUS
  Filled 2024-01-01: qty 1

## 2024-01-01 MED ORDER — GADOPICLENOL 0.5 MMOL/ML IV SOLN
10.0000 mL | Freq: Once | INTRAVENOUS | Status: AC | PRN
  Administered 2024-01-01: 10 mL via INTRAVENOUS

## 2024-01-01 MED ORDER — HYDROMORPHONE HCL 1 MG/ML IJ SOLN
0.5000 mg | INTRAMUSCULAR | Status: AC | PRN
  Administered 2024-01-01 (×2): 0.5 mg via INTRAVENOUS
  Filled 2024-01-01 (×2): qty 1

## 2024-01-01 NOTE — ED Triage Notes (Signed)
 Pt BIB [facility] from home, fall left hip injury, denies getting dizzy prior to fall, but dizzy when getting up. Recently just got R hip replacement Recently stopped vertigo meds.   fent 140/70 78 Hr 97% RA CBG 328 + 10 units insulin  at dinner

## 2024-01-01 NOTE — Consult Note (Signed)
 Called about the EDP for this patient.  He has status post intramedullary nail fixation of left hip fracture on 11/17/2023 by Dr. Sherida.  Patient had a fall and now has a peri-implant fracture.  Patient will likely require revision surgery.  Will discuss with Dr. Sherida and team in the morning.  Keep patient n.p.o. past midnight for possible surgery 11/22.  Bedrest.

## 2024-01-01 NOTE — Telephone Encounter (Signed)
 Name of Medication:  Oxycodone -APAP Name of Pharmacy:  [facility] Last Fill or Written Date and Quantity:  12/18/23, #30 Last Office Visit and Type:  12/18/23, HFU Next Office Visit and Type:  none Last Controlled Substance Agreement Date:  none Last UDS:  none

## 2024-01-01 NOTE — ED Provider Notes (Signed)
 [place] EMERGENCY DEPARTMENT AT [facility] Provider Note   CSN: [id number] Arrival date & time: 01/01/24  2101     Patient presents with: [name] is a 89 y.o. male.    Fall     The patient has a history of syncope hypertension hyperlipidemia diverticulosis, pyelonephritis, arthritis, coronary disease, diabetes, prior hip surgery.  Patient states he was walking at home.  He thinks his leg may have given out on him.  Patient had a falling landing on his left side.  Patient did feel dizzy after the fall.  Is not sure if he lost consciousness.  He is not having chest pain or abdominal pain.  No headaches now.  No fever.  Patient is having severe pain in his left hip and he was not able to stand.  He had to call EMS.  Prior to Admission medications   Medication Sig Start Date End Date Taking? Authorizing Provider  Accu-Chek Softclix Lancets lancets TEST BLOOD SUGAR UP TO THREE TIMES DAILY 01/23/20   [name], MD  amLODipine  (NORVASC ) 5 MG tablet Take 1 tablet (5 mg total) by mouth every morning. 12/02/23   [name], MD  aspirin  EC 81 MG tablet Take 1 tablet (81 mg total) by mouth 2 (two) times daily. 11/24/23 01/05/24  Raenelle Coria, MD  Blood Glucose Calibration (ACCU-CHEK AVIVA) SOLN Use as directed monthly. E11.59  Insulin  dependent 12/06/18   [name], MD  Blood Glucose Monitoring Suppl (CONTOUR BLOOD GLUCOSE SYSTEM) w/Device KIT 1 each by Does not apply route 3 (three) times daily. 11/09/23   [name], MD  carvedilol  (COREG ) 12.5 MG tablet TAKE 1 TABLET BY MOUTH TWICE  DAILY WITH MEALS 11/13/23   [name], MD  COD LIVER OIL PO Take 1 capsule by mouth 2 (two) times daily.     [provider]  cyanocobalamin  (VITAMIN B12) 1000 MCG tablet Take 1 tablet (1,000 mcg total) by mouth daily. 09/29/23   [name], MD  diclofenac  Sodium (VOLTAREN ) 1 % GEL Apply 2 g topically 4 (four) times daily. 06/11/23   Vann,  Jessica U, DO  DROPLET PEN NEEDLES 30G X 8 MM MISC USE  WITH  INSULIN   PEN 02/03/19   [name], MD  empagliflozin  (JARDIANCE ) 10 MG TABS tablet Take 1 tablet (10 mg total) by mouth daily before breakfast. Bill secondary: BIN [id number], PCN [id number], GRP [id number], ID [id number] 10/09/23   [name], MD  escitalopram  (LEXAPRO ) 20 MG tablet Take 1 tablet (20 mg total) by mouth daily. 09/29/23   [name], MD  furosemide  (LASIX ) 40 MG tablet Take 1 tablet (40 mg total) by mouth daily. Can take an additional 20mg  as needed for weight gain of 2 lbs in a day or 5lbs in a week 06/23/23   Wyn Jackee VEAR Mickey., NP  glucose blood (CONTOUR TEST) test strip Use as instructed 11/09/23   [name], MD  hydrALAZINE  (APRESOLINE ) 50 MG tablet TAKE 1 TABLET BY MOUTH 3 TIMES  DAILY 11/13/23   [name], MD  insulin  lispro (HUMALOG  KWIKPEN) 100 UNIT/ML KwikPen INJECT SUBCUTANEOUSLY 4 TO 10  UNITS 3 TIMES DAILY BEFORE MEALS PER SLIDING SCALE: 151-200 = 4  UN, 201-250 = 6 UN, 251-300 = 8  UN, AND OVER 300 = 10 UN 07/08/23   [name], MD  losartan  (COZAAR ) 25 MG tablet Take 1 tablet (25 mg total) by  mouth daily. 04/17/23 04/16/24  Vernon Ranks, MD  meclizine  (ANTIVERT ) 12.5 MG tablet Take 1 tablet (12.5 mg total) by mouth 3 (three) times daily as needed for dizziness. 09/29/23   [name], MD  methocarbamol  (ROBAXIN ) 500 MG tablet Take 0.5 tablets (250 mg total) by mouth 4 (four) times daily. 11/24/23   Ghimire, Kuber, MD  ondansetron  (ZOFRAN ) 4 MG tablet Take 1 tablet (4 mg total) by mouth every 6 (six) hours. 03/06/23   Kingsley, Victoria K, DO  oxyCODONE  (OXY IR/ROXICODONE ) 5 MG immediate release tablet Take 1 tablet (5 mg total) by mouth every 6 (six) hours as needed for moderate pain (pain score 4-6) or severe pain (pain score 7-10). Don't take with tramadol . 12/18/23   [name], MD  polyethylene glycol (MIRALAX  / GLYCOLAX ) 17 g packet Take 17 g by mouth 2 (two) times daily.  11/24/23   Ghimire, Kuber, MD  potassium chloride  SA (KLOR-CON  M) 20 MEQ tablet Take 2 tablets (40 mEq total) by mouth daily. 12/16/23   [name], MD  senna-docusate (SENOKOT-S) 8.6-50 MG tablet Take 1 tablet by mouth 2 (two) times daily. 11/24/23   Ghimire, Kuber, MD  tamsulosin  (FLOMAX ) 0.4 MG CAPS capsule TAKE 1 CAPSULE BY MOUTH DAILY  AFTER SUPPER 08/19/23   [name], MD  [name]  SOLOSTAR 300 UNIT/ML Solostar Pen INJECT SUBCUTANEOUSLY 16 UNITS  AT BEDTIME 11/13/23   [name], MD  traMADol  (ULTRAM ) 50 MG tablet TAKE 1 TABLET BY MOUTH EVERY 12 HOURS AS NEEDED FOR KNEE PAIN. 09/29/23   [name], MD    Allergies: Morphine , Atorvastatin , Codeine phosphate, and Metformin  and related    Review of Systems  Updated Vital Signs BP (!) 160/78   Pulse 71   Temp 97.9 F (36.6 C) (Oral)   Resp (!) 22   SpO2 99%   Physical Exam Vitals and nursing note reviewed.  Constitutional:      General: He is not in acute distress.    Appearance: He is well-developed.  HENT:     Head: Normocephalic and atraumatic.     Right Ear: External ear normal.     Left Ear: External ear normal.  Eyes:     General: No scleral icterus.       Right eye: No discharge.        Left eye: No discharge.     Conjunctiva/sclera: Conjunctivae normal.  Neck:     Trachea: No tracheal deviation.  Cardiovascular:     Rate and Rhythm: Normal rate and regular rhythm.  Pulmonary:     Effort: Pulmonary effort is normal. No respiratory distress.     Breath sounds: Normal breath sounds. No stridor. No wheezing or rales.  Abdominal:     General: Bowel sounds are normal. There is no distension.     Palpations: Abdomen is soft.     Tenderness: There is no abdominal tenderness. There is no guarding or rebound.  Musculoskeletal:        General: Tenderness present.     Cervical back: Neck supple.     Left hip: Deformity and tenderness present. Decreased range of motion.  Skin:    General: Skin is warm  and dry.     Findings: No rash.  Neurological:     General: No focal deficit present.     Mental Status: He is alert.     Cranial Nerves: No cranial nerve deficit, dysarthria or facial asymmetry.     Sensory: No  sensory deficit.     Motor: No abnormal muscle tone or seizure activity.     Coordination: Coordination normal.  Psychiatric:        Mood and Affect: Mood normal.     (all labs ordered are listed, but only abnormal results are displayed) Labs Reviewed  BASIC METABOLIC PANEL WITH GFR - Abnormal; Notable for the following components:      Result Value   Glucose, Bld 242 (*)    BUN 26 (*)    Creatinine, Ser 1.40 (*)    GFR, Estimated 49 (*)    All other components within normal limits  CBC WITH DIFFERENTIAL/PLATELET  PROTIME-INR  TYPE AND SCREEN    EKG: EKG Interpretation Date/Time:  Friday January 01 2024 21:17:08 EST Ventricular Rate:  69 PR Interval:  186 QRS Duration:  110 QT Interval:  422 QTC Calculation: 453 R Axis:   16  Text Interpretation: Sinus rhythm Abnormal R-wave progression, late transition Borderline repolarization abnormality No significant change since last tracing Confirmed by Randol Simmonds [phone]) on 01/01/2024 10:30:17 PM  Radiology: [facility]A Hip Unilat With Pelvis 2-3 Views Left Result Date: 01/01/2024 EXAM: 2 or 3 VIEW(S) XRAY OF THE LEFT HIP 01/01/2024 10:47:47 PM COMPARISON: 11/16/2023 CLINICAL HISTORY: fall, pain FINDINGS: BONES AND JOINTS: Prior internal fixation across the left femoral intertrochanteric fracture. Acute periprosthetic fracture in the proximal left femoral shaft with mild displacement. No dislocation. SOFT TISSUES: The soft tissues are unremarkable. IMPRESSION: 1. Acute periprosthetic fracture in the proximal left femoral shaft with mild displacement. Electronically signed by: Franky Crease MD 01/01/2024 10:52 PM EST RP Workstation: [id number]   CT CERVICAL SPINE WO CONTRAST Result Date: 01/01/2024 EXAM: CT CERVICAL SPINE WITHOUT  CONTRAST 01/01/2024 10:12:00 PM TECHNIQUE: CT of the cervical spine was performed without the administration of intravenous contrast. Multiplanar reformatted images are provided for review. Automated exposure control, iterative reconstruction, and/or weight based adjustment of the mA/kV was utilized to reduce the radiation dose to as low as reasonably achievable. COMPARISON: 11/16/2023 CLINICAL HISTORY: Neck trauma (Age >= 65y) FINDINGS: CERVICAL SPINE: BONES AND ALIGNMENT: No acute fracture or traumatic malalignment. DEGENERATIVE CHANGES: Advanced bilateral degenerative facet disease diffusely. Large flowing anterior osteophytes throughout the cervical spine. SOFT TISSUES: No prevertebral soft tissue swelling. IMPRESSION: 1. No acute abnormality of the cervical spine related to the reported neck trauma. Electronically signed by: Franky Crease MD 01/01/2024 10:34 PM EST RP Workstation: [id number]   CT HEAD WO CONTRAST Result Date: 01/01/2024 EXAM: CT HEAD WITHOUT CONTRAST 01/01/2024 10:12:00 PM TECHNIQUE: CT of the head was performed without the administration of intravenous contrast. Automated exposure control, iterative reconstruction, and/or weight based adjustment of the mA/kV was utilized to reduce the radiation dose to as low as reasonably achievable. COMPARISON: 11/16/2023 CLINICAL HISTORY: Head trauma, minor (Age >= 65y) FINDINGS: BRAIN AND VENTRICLES: There is atrophy and chronic small vessel disease throughout the deep white matter. No acute hemorrhage. No evidence of acute infarct. No hydrocephalus. No extra-axial collection. No mass effect or midline shift. ORBITS: No acute abnormality. SINUSES: No acute abnormality. SOFT TISSUES AND SKULL: No acute soft tissue abnormality. No skull fracture. IMPRESSION: 1. No acute intracranial abnormality. 2. Chronic atrophy and small vessel ischemic changes in the deep white matter. Electronically signed by: Franky Crease MD 01/01/2024 10:33 PM EST RP Workstation:  [id number]     Procedures   Medications Ordered in the ED  HYDROmorphone  (DILAUDID ) injection 0.5 mg (0.5 mg Intravenous Given 01/01/24 2326)    Clinical Course as of 01/01/24  2338  Fri Jan 01, 2024  2229 CBC with Differential CBC normal metabolic panel unremarkable [JK]  2308 Hip x-ray shows a periprosthetic fracture [JK]  2309 Head CT and C-spine CT without acute abnormality [JK]    Clinical Course User Index [JK] Randol Simmonds, MD                                 Medical Decision Making Amount and/or Complexity of Data Reviewed Labs: ordered. Decision-making details documented in ED Course. Radiology: ordered.  Risk Prescription drug management. Decision regarding hospitalization.   Patient presented to the ED for evaluation after a fall at home.  Presentation most consistent with a mechanical fall.  No signs of any significant metabolic abnormalities.  No dysrhythmia noted in the ED.  X-rays unfortunately do show signs of a periprosthetic hip fracture.  I will consult with orthopedic service.  Patient previously had surgery by Dr. Sherida  Discussed with Dr. Mannie regarding admission  Dr Darra spoke with Dr Luz, orthopedics    Final diagnoses:  Periprosthetic fracture of hip, initial encounter    ED Discharge Orders     None          Randol Simmonds, MD 01/01/24 2353

## 2024-01-02 ENCOUNTER — Encounter (HOSPITAL_COMMUNITY): Payer: Self-pay

## 2024-01-02 ENCOUNTER — Inpatient Hospital Stay (HOSPITAL_COMMUNITY): Admitting: Anesthesiology

## 2024-01-02 ENCOUNTER — Inpatient Hospital Stay (HOSPITAL_COMMUNITY)

## 2024-01-02 ENCOUNTER — Encounter (HOSPITAL_COMMUNITY)
Admission: EM | Disposition: A | Discharge status: Skilled Nursing Facility | Payer: Self-pay | Source: Home / Self Care | Attending: Family Medicine

## 2024-01-02 DIAGNOSIS — N401 Enlarged prostate with lower urinary tract symptoms: Secondary | ICD-10-CM | POA: Diagnosis present

## 2024-01-02 DIAGNOSIS — S72102A Unspecified trochanteric fracture of left femur, initial encounter for closed fracture: Secondary | ICD-10-CM

## 2024-01-02 DIAGNOSIS — L89322 Pressure ulcer of left buttock, stage 2: Secondary | ICD-10-CM | POA: Diagnosis present

## 2024-01-02 DIAGNOSIS — N2889 Other specified disorders of kidney and ureter: Secondary | ICD-10-CM | POA: Diagnosis present

## 2024-01-02 DIAGNOSIS — Z888 Allergy status to other drugs, medicaments and biological substances status: Secondary | ICD-10-CM | POA: Diagnosis not present

## 2024-01-02 DIAGNOSIS — I5042 Chronic combined systolic (congestive) and diastolic (congestive) heart failure: Secondary | ICD-10-CM | POA: Diagnosis present

## 2024-01-02 DIAGNOSIS — S72142A Displaced intertrochanteric fracture of left femur, initial encounter for closed fracture: Secondary | ICD-10-CM | POA: Diagnosis present

## 2024-01-02 DIAGNOSIS — W1830XA Fall on same level, unspecified, initial encounter: Secondary | ICD-10-CM | POA: Diagnosis present

## 2024-01-02 DIAGNOSIS — I2510 Atherosclerotic heart disease of native coronary artery without angina pectoris: Secondary | ICD-10-CM

## 2024-01-02 DIAGNOSIS — Z820 Family history of epilepsy and other diseases of the nervous system: Secondary | ICD-10-CM | POA: Diagnosis not present

## 2024-01-02 DIAGNOSIS — Z85820 Personal history of malignant melanoma of skin: Secondary | ICD-10-CM | POA: Diagnosis not present

## 2024-01-02 DIAGNOSIS — Z751 Person awaiting admission to adequate facility elsewhere: Secondary | ICD-10-CM | POA: Diagnosis not present

## 2024-01-02 DIAGNOSIS — Z7901 Long term (current) use of anticoagulants: Secondary | ICD-10-CM | POA: Diagnosis not present

## 2024-01-02 DIAGNOSIS — S72002A Fracture of unspecified part of neck of left femur, initial encounter for closed fracture: Secondary | ICD-10-CM | POA: Diagnosis present

## 2024-01-02 DIAGNOSIS — Y92009 Unspecified place in unspecified non-institutional (private) residence as the place of occurrence of the external cause: Secondary | ICD-10-CM | POA: Diagnosis not present

## 2024-01-02 DIAGNOSIS — Z7982 Long term (current) use of aspirin: Secondary | ICD-10-CM | POA: Diagnosis not present

## 2024-01-02 DIAGNOSIS — Z7984 Long term (current) use of oral hypoglycemic drugs: Secondary | ICD-10-CM | POA: Diagnosis not present

## 2024-01-02 DIAGNOSIS — Z794 Long term (current) use of insulin: Secondary | ICD-10-CM | POA: Diagnosis not present

## 2024-01-02 DIAGNOSIS — M9702XA Periprosthetic fracture around internal prosthetic left hip joint, initial encounter: Secondary | ICD-10-CM | POA: Diagnosis present

## 2024-01-02 DIAGNOSIS — N138 Other obstructive and reflux uropathy: Secondary | ICD-10-CM | POA: Diagnosis present

## 2024-01-02 DIAGNOSIS — I110 Hypertensive heart disease with heart failure: Secondary | ICD-10-CM

## 2024-01-02 DIAGNOSIS — E119 Type 2 diabetes mellitus without complications: Secondary | ICD-10-CM | POA: Diagnosis present

## 2024-01-02 DIAGNOSIS — Z8249 Family history of ischemic heart disease and other diseases of the circulatory system: Secondary | ICD-10-CM | POA: Diagnosis not present

## 2024-01-02 DIAGNOSIS — D62 Acute posthemorrhagic anemia: Secondary | ICD-10-CM | POA: Diagnosis not present

## 2024-01-02 DIAGNOSIS — Z79899 Other long term (current) drug therapy: Secondary | ICD-10-CM | POA: Diagnosis not present

## 2024-01-02 DIAGNOSIS — Z885 Allergy status to narcotic agent status: Secondary | ICD-10-CM | POA: Diagnosis not present

## 2024-01-02 DIAGNOSIS — Z860100 Personal history of colon polyps, unspecified: Secondary | ICD-10-CM | POA: Diagnosis not present

## 2024-01-02 DIAGNOSIS — E785 Hyperlipidemia, unspecified: Secondary | ICD-10-CM | POA: Diagnosis present

## 2024-01-02 HISTORY — PX: INTRAMEDULLARY (IM) NAIL INTERTROCHANTERIC: SHX5875

## 2024-01-02 LAB — GLUCOSE, CAPILLARY
Glucose-Capillary: 239 mg/dL — ABNORMAL HIGH (ref 70–99)
Glucose-Capillary: 230 mg/dL — ABNORMAL HIGH (ref 70–99)
Glucose-Capillary: 182 mg/dL — ABNORMAL HIGH (ref 70–99)
Glucose-Capillary: 177 mg/dL — ABNORMAL HIGH (ref 70–99)
Glucose-Capillary: 221 mg/dL — ABNORMAL HIGH (ref 70–99)

## 2024-01-02 LAB — VITAMIN D 25 HYDROXY (VIT D DEFICIENCY, FRACTURES): Vit D, 25-Hydroxy: 32.49 ng/mL (ref 30–100)

## 2024-01-02 LAB — SURGICAL PCR SCREEN
MRSA, PCR: NEGATIVE
Staphylococcus aureus: NEGATIVE

## 2024-01-02 MED ORDER — ROCURONIUM BROMIDE 10 MG/ML (PF) SYRINGE
PREFILLED_SYRINGE | INTRAVENOUS | Status: DC | PRN
  Administered 2024-01-02: 10 mg via INTRAVENOUS
  Administered 2024-01-02: 50 mg via INTRAVENOUS
  Administered 2024-01-02 (×2): 20 mg via INTRAVENOUS

## 2024-01-02 MED ORDER — KETOROLAC TROMETHAMINE 15 MG/ML IJ SOLN
15.0000 mg | Freq: Once | INTRAMUSCULAR | Status: AC
  Administered 2024-01-02: 15 mg via INTRAVENOUS
  Filled 2024-01-02 (×2): qty 1

## 2024-01-02 MED ORDER — DROPERIDOL 2.5 MG/ML IJ SOLN: 0.6250 mg | Freq: Once | INTRAMUSCULAR | Status: DC | PRN

## 2024-01-02 MED ORDER — HYDROMORPHONE HCL 1 MG/ML IJ SOLN: 0.5000 mg | INTRAMUSCULAR | Status: DC | PRN

## 2024-01-02 MED ORDER — INSULIN ASPART 100 UNIT/ML IJ SOLN
10.0000 [IU] | Freq: Once | INTRAMUSCULAR | Status: AC
  Administered 2024-01-02: 10 [IU] via SUBCUTANEOUS
  Filled 2024-01-02: qty 10

## 2024-01-02 MED ORDER — ACETAMINOPHEN 325 MG PO TABS
650.0000 mg | ORAL_TABLET | Freq: Four times a day (QID) | ORAL | Status: DC
  Administered 2024-01-02 – 2024-01-06 (×16): 650 mg via ORAL
  Filled 2024-01-02 (×16): qty 2

## 2024-01-02 MED ORDER — INSULIN ASPART 100 UNIT/ML IJ SOLN
0.0000 [IU] | Freq: Three times a day (TID) | INTRAMUSCULAR | Status: DC
  Administered 2024-01-02: 3 [IU] via SUBCUTANEOUS
  Administered 2024-01-02 (×2): 2 [IU] via SUBCUTANEOUS
  Administered 2024-01-03: 3 [IU] via SUBCUTANEOUS
  Administered 2024-01-03: 5 [IU] via SUBCUTANEOUS
  Administered 2024-01-03 – 2024-01-04 (×2): 2 [IU] via SUBCUTANEOUS
  Administered 2024-01-04 (×2): 3 [IU] via SUBCUTANEOUS
  Administered 2024-01-05: 5 [IU] via SUBCUTANEOUS
  Administered 2024-01-05: 3 [IU] via SUBCUTANEOUS
  Administered 2024-01-05 – 2024-01-06 (×4): 2 [IU] via SUBCUTANEOUS
  Administered 2024-01-07 (×2): 5 [IU] via SUBCUTANEOUS
  Administered 2024-01-07 – 2024-01-08 (×2): 2 [IU] via SUBCUTANEOUS
  Filled 2024-01-02 (×3): qty 2
  Filled 2024-01-02: qty 3
  Filled 2024-01-02: qty 2
  Filled 2024-01-02 (×2): qty 5
  Filled 2024-01-02: qty 3
  Filled 2024-01-02 (×2): qty 2
  Filled 2024-01-02 (×2): qty 3
  Filled 2024-01-02: qty 2
  Filled 2024-01-02: qty 3
  Filled 2024-01-02: qty 2
  Filled 2024-01-02: qty 5
  Filled 2024-01-02 (×2): qty 2

## 2024-01-02 MED ORDER — OXYCODONE HCL 5 MG PO TABS: 5.0000 mg | ORAL_TABLET | Freq: Once | ORAL | Status: DC | PRN

## 2024-01-02 MED ORDER — METOCLOPRAMIDE HCL 5 MG PO TABS: 5.0000 mg | ORAL_TABLET | Freq: Three times a day (TID) | ORAL | Status: DC | PRN

## 2024-01-02 MED ORDER — TAMSULOSIN HCL 0.4 MG PO CAPS
0.4000 mg | ORAL_CAPSULE | Freq: Every day | ORAL | Status: DC
  Administered 2024-01-02 – 2024-01-07 (×6): 0.4 mg via ORAL
  Filled 2024-01-02 (×6): qty 1

## 2024-01-02 MED ORDER — CHLORHEXIDINE GLUCONATE 0.12 % MT SOLN
OROMUCOSAL | Status: AC
  Filled 2024-01-02: qty 15

## 2024-01-02 MED ORDER — GLYCOPYRROLATE 0.2 MG/ML IJ SOLN
INTRAMUSCULAR | Status: DC | PRN
  Administered 2024-01-02: .2 mg via INTRAVENOUS

## 2024-01-02 MED ORDER — METHOCARBAMOL 1000 MG/10ML IJ SOLN: 500.0000 mg | Freq: Three times a day (TID) | INTRAMUSCULAR | Status: DC | PRN

## 2024-01-02 MED ORDER — PHENYLEPHRINE 80 MCG/ML (10ML) SYRINGE FOR IV PUSH (FOR BLOOD PRESSURE SUPPORT)
PREFILLED_SYRINGE | INTRAVENOUS | Status: DC | PRN
  Administered 2024-01-02 (×2): 80 ug via INTRAVENOUS

## 2024-01-02 MED ORDER — CHLORHEXIDINE GLUCONATE 0.12 % MT SOLN
15.0000 mL | Freq: Once | OROMUCOSAL | Status: AC
  Administered 2024-01-02: 15 mL via OROMUCOSAL

## 2024-01-02 MED ORDER — INSULIN ASPART 100 UNIT/ML IJ SOLN
15.0000 [IU] | Freq: Once | INTRAMUSCULAR | Status: DC
  Filled 2024-01-02: qty 15

## 2024-01-02 MED ORDER — INSULIN GLARGINE 100 UNITS/ML SOLOSTAR PEN
16.0000 [IU] | PEN_INJECTOR | Freq: Every day | SUBCUTANEOUS | Status: DC
  Filled 2024-01-02: qty 3

## 2024-01-02 MED ORDER — SUGAMMADEX SODIUM 200 MG/2ML IV SOLN
INTRAVENOUS | Status: DC | PRN
  Administered 2024-01-02: 200 mg via INTRAVENOUS

## 2024-01-02 MED ORDER — ORAL CARE MOUTH RINSE: 15.0000 mL | Freq: Once | OROMUCOSAL | Status: AC

## 2024-01-02 MED ORDER — HYDROMORPHONE HCL 1 MG/ML IJ SOLN
0.5000 mg | INTRAMUSCULAR | Status: DC | PRN
  Administered 2024-01-02 – 2024-01-04 (×4): 1 mg via INTRAVENOUS
  Filled 2024-01-02 (×4): qty 1

## 2024-01-02 MED ORDER — INSULIN GLARGINE-YFGN 100 UNIT/ML ~~LOC~~ SOLN
16.0000 [IU] | Freq: Every day | SUBCUTANEOUS | Status: DC
  Administered 2024-01-02 – 2024-01-05 (×4): 16 [IU] via SUBCUTANEOUS
  Filled 2024-01-02 (×6): qty 0.16

## 2024-01-02 MED ORDER — SENNOSIDES-DOCUSATE SODIUM 8.6-50 MG PO TABS: 1.0000 | ORAL_TABLET | Freq: Every evening | ORAL | Status: DC | PRN

## 2024-01-02 MED ORDER — ACETAMINOPHEN 10 MG/ML IV SOLN
INTRAVENOUS | Status: DC | PRN
  Administered 2024-01-02: 1000 mg via INTRAVENOUS

## 2024-01-02 MED ORDER — LOSARTAN POTASSIUM 25 MG PO TABS
25.0000 mg | ORAL_TABLET | Freq: Every day | ORAL | Status: DC
  Administered 2024-01-03 – 2024-01-07 (×5): 25 mg via ORAL
  Filled 2024-01-02 (×6): qty 1

## 2024-01-02 MED ORDER — EPINEPHRINE PF 1 MG/ML IJ SOLN
INTRAMUSCULAR | Status: DC | PRN
  Administered 2024-01-02: .005 mg via INTRAVENOUS
  Administered 2024-01-02: .01 mg via INTRAVENOUS
  Administered 2024-01-02 (×2): .005 mg via INTRAVENOUS

## 2024-01-02 MED ORDER — FENTANYL CITRATE (PF) 100 MCG/2ML IJ SOLN
25.0000 ug | INTRAMUSCULAR | Status: DC | PRN
  Administered 2024-01-02: 50 ug via INTRAVENOUS
  Administered 2024-01-02 (×2): 25 ug via INTRAVENOUS

## 2024-01-02 MED ORDER — VITAMIN B-12 1000 MCG PO TABS
1000.0000 ug | ORAL_TABLET | Freq: Every day | ORAL | Status: DC
  Administered 2024-01-02 – 2024-01-08 (×7): 1000 ug via ORAL
  Filled 2024-01-02 (×7): qty 1

## 2024-01-02 MED ORDER — PHENYLEPHRINE 80 MCG/ML (10ML) SYRINGE FOR IV PUSH (FOR BLOOD PRESSURE SUPPORT)
PREFILLED_SYRINGE | INTRAVENOUS | Status: AC
  Filled 2024-01-02: qty 10

## 2024-01-02 MED ORDER — PHENYLEPHRINE HCL-NACL 20-0.9 MG/250ML-% IV SOLN
INTRAVENOUS | Status: DC | PRN
  Administered 2024-01-02: 30 ug/min via INTRAVENOUS

## 2024-01-02 MED ORDER — MECLIZINE HCL 12.5 MG PO TABS
12.5000 mg | ORAL_TABLET | Freq: Three times a day (TID) | ORAL | Status: DC | PRN
Start: 2024-01-02 — End: 2024-01-08

## 2024-01-02 MED ORDER — ENOXAPARIN SODIUM 40 MG/0.4ML IJ SOSY
40.0000 mg | PREFILLED_SYRINGE | INTRAMUSCULAR | Status: DC
  Administered 2024-01-02 – 2024-01-08 (×7): 40 mg via SUBCUTANEOUS
  Filled 2024-01-02 (×7): qty 0.4

## 2024-01-02 MED ORDER — SODIUM CHLORIDE (PF) 0.9 % IJ SOLN
INTRAMUSCULAR | Status: AC
  Filled 2024-01-02: qty 10

## 2024-01-02 MED ORDER — FENTANYL CITRATE (PF) 100 MCG/2ML IJ SOLN
INTRAMUSCULAR | Status: AC
  Filled 2024-01-02: qty 2

## 2024-01-02 MED ORDER — ACETAMINOPHEN 10 MG/ML IV SOLN: 1000.0000 mg | Freq: Once | INTRAVENOUS | Status: DC | PRN

## 2024-01-02 MED ORDER — ACETAMINOPHEN 650 MG RE SUPP: 650.0000 mg | Freq: Four times a day (QID) | RECTAL | Status: DC | PRN

## 2024-01-02 MED ORDER — TRANEXAMIC ACID-NACL 1000-0.7 MG/100ML-% IV SOLN
1000.0000 mg | Freq: Once | INTRAVENOUS | Status: AC
  Administered 2024-01-02: 1000 mg via INTRAVENOUS
  Filled 2024-01-02: qty 100

## 2024-01-02 MED ORDER — ONDANSETRON HCL 4 MG PO TABS: 4.0000 mg | ORAL_TABLET | Freq: Four times a day (QID) | ORAL | Status: DC | PRN

## 2024-01-02 MED ORDER — VASOPRESSIN 20 UNIT/ML IV SOLN
INTRAVENOUS | Status: AC
  Filled 2024-01-02: qty 1

## 2024-01-02 MED ORDER — AMLODIPINE BESYLATE 5 MG PO TABS
5.0000 mg | ORAL_TABLET | Freq: Every morning | ORAL | Status: DC
Start: 2024-01-02 — End: 2024-01-05
  Administered 2024-01-03 – 2024-01-05 (×3): 5 mg via ORAL
  Filled 2024-01-02 (×4): qty 1

## 2024-01-02 MED ORDER — LACTATED RINGERS IV SOLN: INTRAVENOUS | Status: DC | PRN

## 2024-01-02 MED ORDER — VANCOMYCIN HCL 1000 MG IV SOLR
INTRAVENOUS | Status: AC
  Filled 2024-01-02: qty 20

## 2024-01-02 MED ORDER — CARVEDILOL 12.5 MG PO TABS
12.5000 mg | ORAL_TABLET | Freq: Two times a day (BID) | ORAL | Status: DC
  Administered 2024-01-02 – 2024-01-08 (×13): 12.5 mg via ORAL
  Filled 2024-01-02 (×13): qty 1

## 2024-01-02 MED ORDER — OXYCODONE HCL 5 MG/5ML PO SOLN: 5.0000 mg | Freq: Once | ORAL | Status: DC | PRN

## 2024-01-02 MED ORDER — ASPIRIN 81 MG PO TBEC
81.0000 mg | DELAYED_RELEASE_TABLET | Freq: Every day | ORAL | Status: DC
  Administered 2024-01-02 – 2024-01-08 (×7): 81 mg via ORAL
  Filled 2024-01-02 (×7): qty 1

## 2024-01-02 MED ORDER — CEFAZOLIN SODIUM-DEXTROSE 2-4 GM/100ML-% IV SOLN
2.0000 g | Freq: Once | INTRAVENOUS | Status: AC
  Administered 2024-01-02: 2 g via INTRAVENOUS

## 2024-01-02 MED ORDER — KETOROLAC TROMETHAMINE 15 MG/ML IJ SOLN
7.5000 mg | Freq: Four times a day (QID) | INTRAMUSCULAR | Status: AC
  Administered 2024-01-02 – 2024-01-03 (×4): 7.5 mg via INTRAVENOUS
  Filled 2024-01-02 (×4): qty 1

## 2024-01-02 MED ORDER — ALBUMIN HUMAN 5 % IV SOLN: INTRAVENOUS | Status: DC | PRN

## 2024-01-02 MED ORDER — FUROSEMIDE 40 MG PO TABS
40.0000 mg | ORAL_TABLET | Freq: Every day | ORAL | Status: DC
  Administered 2024-01-02 – 2024-01-08 (×7): 40 mg via ORAL
  Filled 2024-01-02 (×7): qty 1

## 2024-01-02 MED ORDER — VASOPRESSIN 20 UNIT/ML IV SOLN
INTRAVENOUS | Status: DC | PRN
  Administered 2024-01-02 (×2): 1 [IU] via INTRAVENOUS

## 2024-01-02 MED ORDER — CEFAZOLIN SODIUM-DEXTROSE 2-4 GM/100ML-% IV SOLN
INTRAVENOUS | Status: AC
  Filled 2024-01-02: qty 100

## 2024-01-02 MED ORDER — ACETAMINOPHEN 325 MG PO TABS
650.0000 mg | ORAL_TABLET | Freq: Four times a day (QID) | ORAL | Status: DC | PRN
Start: 2024-01-02 — End: 2024-01-03

## 2024-01-02 MED ORDER — ONDANSETRON HCL 4 MG/2ML IJ SOLN
4.0000 mg | Freq: Four times a day (QID) | INTRAMUSCULAR | Status: DC | PRN
  Administered 2024-01-05: 4 mg via INTRAVENOUS
  Filled 2024-01-02: qty 2

## 2024-01-02 MED ORDER — PROPOFOL 10 MG/ML IV BOLUS
INTRAVENOUS | Status: DC | PRN
  Administered 2024-01-02: 20 mg via INTRAVENOUS
  Administered 2024-01-02: 40 mg via INTRAVENOUS
  Administered 2024-01-02: 30 mg via INTRAVENOUS
  Administered 2024-01-02: 50 mg via INTRAVENOUS

## 2024-01-02 MED ORDER — FENTANYL CITRATE (PF) 250 MCG/5ML IJ SOLN
INTRAMUSCULAR | Status: DC | PRN
  Administered 2024-01-02: 25 ug via INTRAVENOUS
  Administered 2024-01-02: 75 ug via INTRAVENOUS

## 2024-01-02 MED ORDER — SENNOSIDES-DOCUSATE SODIUM 8.6-50 MG PO TABS
3.0000 | ORAL_TABLET | Freq: Every day | ORAL | Status: DC
  Administered 2024-01-02 – 2024-01-05 (×4): 3 via ORAL
  Filled 2024-01-02 (×4): qty 3

## 2024-01-02 MED ORDER — ESCITALOPRAM OXALATE 20 MG PO TABS
20.0000 mg | ORAL_TABLET | Freq: Every day | ORAL | Status: DC
  Administered 2024-01-02 – 2024-01-08 (×7): 20 mg via ORAL
  Filled 2024-01-02 (×7): qty 1

## 2024-01-02 MED ORDER — DIPHENHYDRAMINE HCL 12.5 MG/5ML PO ELIX: 12.5000 mg | ORAL_SOLUTION | ORAL | Status: DC | PRN

## 2024-01-02 MED ORDER — TRAMADOL HCL 50 MG PO TABS
50.0000 mg | ORAL_TABLET | Freq: Four times a day (QID) | ORAL | Status: DC | PRN
  Administered 2024-01-03: 100 mg via ORAL
  Administered 2024-01-03 (×2): 50 mg via ORAL
  Administered 2024-01-04: 100 mg via ORAL
  Administered 2024-01-04 – 2024-01-05 (×3): 50 mg via ORAL
  Filled 2024-01-02: qty 2
  Filled 2024-01-02: qty 1
  Filled 2024-01-02: qty 2
  Filled 2024-01-02 (×3): qty 1
  Filled 2024-01-02: qty 2

## 2024-01-02 MED ORDER — CEFAZOLIN SODIUM-DEXTROSE 2-4 GM/100ML-% IV SOLN
2.0000 g | Freq: Three times a day (TID) | INTRAVENOUS | Status: AC
  Administered 2024-01-02 – 2024-01-03 (×3): 2 g via INTRAVENOUS
  Filled 2024-01-02 (×3): qty 100

## 2024-01-02 MED ORDER — ACETAMINOPHEN 10 MG/ML IV SOLN
INTRAVENOUS | Status: AC
  Filled 2024-01-02: qty 100

## 2024-01-02 MED ORDER — DOCUSATE SODIUM 100 MG PO CAPS
100.0000 mg | ORAL_CAPSULE | Freq: Two times a day (BID) | ORAL | Status: DC
  Administered 2024-01-02 – 2024-01-06 (×9): 100 mg via ORAL
  Filled 2024-01-02 (×9): qty 1

## 2024-01-02 MED ORDER — VANCOMYCIN HCL 1000 MG IV SOLR
INTRAVENOUS | Status: DC | PRN
  Administered 2024-01-02: 1000 mg via TOPICAL

## 2024-01-02 MED ORDER — HYDRALAZINE HCL 50 MG PO TABS
50.0000 mg | ORAL_TABLET | Freq: Three times a day (TID) | ORAL | Status: DC
  Administered 2024-01-02 – 2024-01-07 (×15): 50 mg via ORAL
  Filled 2024-01-02 (×16): qty 1

## 2024-01-02 MED ORDER — METOCLOPRAMIDE HCL 5 MG/ML IJ SOLN: 5.0000 mg | Freq: Three times a day (TID) | INTRAMUSCULAR | Status: DC | PRN

## 2024-01-02 MED ORDER — POLYETHYLENE GLYCOL 3350 17 G PO PACK: 17.0000 g | PACK | Freq: Every day | ORAL | Status: DC | PRN

## 2024-01-02 MED ORDER — HYDROCODONE-ACETAMINOPHEN 5-325 MG PO TABS: 1.0000 | ORAL_TABLET | ORAL | Status: DC | PRN

## 2024-01-02 MED ORDER — LIDOCAINE 2% (20 MG/ML) 5 ML SYRINGE
INTRAMUSCULAR | Status: DC | PRN
  Administered 2024-01-02: 100 mg via INTRAVENOUS

## 2024-01-02 MED ORDER — METHOCARBAMOL 500 MG PO TABS
500.0000 mg | ORAL_TABLET | Freq: Three times a day (TID) | ORAL | Status: DC | PRN
  Administered 2024-01-03 – 2024-01-07 (×5): 500 mg via ORAL
  Filled 2024-01-02 (×5): qty 1

## 2024-01-02 MED ORDER — LACTATED RINGERS IV SOLN: INTRAVENOUS | Status: DC

## 2024-01-02 MED ORDER — 0.9 % SODIUM CHLORIDE (POUR BTL) OPTIME
TOPICAL | Status: DC | PRN
  Administered 2024-01-02: 1000 mL

## 2024-01-02 MED ORDER — TRAMADOL HCL 50 MG PO TABS: 50.0000 mg | ORAL_TABLET | Freq: Two times a day (BID) | ORAL | Status: DC | PRN

## 2024-01-02 MED ORDER — EPINEPHRINE 1 MG/10ML IV SOSY
PREFILLED_SYRINGE | INTRAVENOUS | Status: AC
  Filled 2024-01-02: qty 10

## 2024-01-02 SURGICAL SUPPLY — 32 items
BAG COUNTER SPONGE SURGICOUNT (BAG) IMPLANT
BIT DRILL INTERTAN LAG SCREW (BIT) IMPLANT
BIT DRILL SHORT 4.0 (BIT) IMPLANT
BRUSH SCRUB EZ PLAIN DRY (MISCELLANEOUS) ×2 IMPLANT
CHLORAPREP W/TINT 26 (MISCELLANEOUS) ×1 IMPLANT
COVER SURGICAL LIGHT HANDLE (MISCELLANEOUS) ×1 IMPLANT
DERMABOND ADVANCED .7 DNX12 (GAUZE/BANDAGES/DRESSINGS) ×1 IMPLANT
DRAPE IMP U-DRAPE 54X76 (DRAPES) ×2 IMPLANT
DRAPE INCISE IOBAN 66X45 STRL (DRAPES) ×1 IMPLANT
DRAPE STERI IOBAN 125X83 (DRAPES) ×1 IMPLANT
DRAPE SURG 17X23 STRL (DRAPES) ×2 IMPLANT
DRAPE U-SHAPE 47X51 STRL (DRAPES) ×1 IMPLANT
DRESSING MEPILEX FLEX 4X4 (GAUZE/BANDAGES/DRESSINGS) ×1 IMPLANT
ELECTRODE REM PT RTRN 9FT ADLT (ELECTROSURGICAL) ×1 IMPLANT
GLOVE BIO SURGEON STRL SZ 6.5 (GLOVE) ×3 IMPLANT
GLOVE BIO SURGEON STRL SZ7.5 (GLOVE) ×4 IMPLANT
GLOVE BIOGEL PI IND STRL 6.5 (GLOVE) ×1 IMPLANT
GLOVE BIOGEL PI IND STRL 7.5 (GLOVE) ×1 IMPLANT
GOWN STRL REUS W/ TWL LRG LVL3 (GOWN DISPOSABLE) ×1 IMPLANT
KIT BASIN OR (CUSTOM PROCEDURE TRAY) ×1 IMPLANT
KIT TURNOVER KIT B (KITS) ×1 IMPLANT
MANIFOLD NEPTUNE II (INSTRUMENTS) ×1 IMPLANT
NAIL EXTRACTOR DISP (INSTRUMENTS) IMPLANT
NAIL LOCK CANN 10X380 130D LT (Nail) IMPLANT
PACK GENERAL/GYN (CUSTOM PROCEDURE TRAY) ×1 IMPLANT
PAD ARMBOARD POSITIONER FOAM (MISCELLANEOUS) ×2 IMPLANT
PIN GUIDE 3.2X343MM (PIN) IMPLANT
ROD GUIDE 3.0 (MISCELLANEOUS) IMPLANT
SCREW LAG COMPR KIT 105/100 (Screw) IMPLANT
SUT MNCRL AB 3-0 PS2 18 (SUTURE) ×1 IMPLANT
SUT VIC AB 2-0 CT1 TAPERPNT 27 (SUTURE) ×2 IMPLANT
TOWEL GREEN STERILE (TOWEL DISPOSABLE) ×2 IMPLANT

## 2024-01-02 NOTE — Anesthesia Procedure Notes (Signed)
 Procedure Name: Intubation Date/Time: 01/02/2024 9:23 AM  Performed by: Oley Aleck LABOR, CRNAPre-anesthesia Checklist: Patient identified, Emergency Drugs available, Suction available and Patient being monitored Patient Re-evaluated:Patient Re-evaluated prior to induction Oxygen Delivery Method: Circle system utilized Preoxygenation: Pre-oxygenation with 100% oxygen Induction Type: IV induction Ventilation: Mask ventilation without difficulty and Oral airway inserted - appropriate to patient size Laryngoscope Size: Mac and 4 Grade View: Grade II Tube type: Oral Tube size: 8.0 mm Number of attempts: 1 Airway Equipment and Method: Stylet Placement Confirmation: ETT inserted through vocal cords under direct vision, positive ETCO2 and breath sounds checked- equal and bilateral Secured at: 26 cm Tube secured with: Tape Dental Injury: Teeth and Oropharynx as per pre-operative assessment  Comments: intubated by C. Merril Isakson, CRNA; ebbs

## 2024-01-02 NOTE — Anesthesia Preprocedure Evaluation (Addendum)
 Anesthesia Evaluation  Patient identified by MRN, date of birth, ID band Patient awake    Reviewed: Allergy & Precautions, NPO status , Patient's Chart, lab work & pertinent test results  History of Anesthesia Complications Negative for: history of anesthetic complications  Airway Mallampati: II  TM Distance: >3 FB     Dental no notable dental hx.    Pulmonary neg COPD   breath sounds clear to auscultation       Cardiovascular hypertension, (-) angina + CAD and +CHF  (-) Cardiac Stents and (-) CABG  Rhythm:Regular Rate:Normal  03/2023 TTE  1. Left ventricular ejection fraction, by estimation, is 35 to 40%. The  left ventricle has moderately decreased function. The left ventricle  demonstrates global hypokinesis. The left ventricular internal cavity size  was mildly dilated. Left ventricular  diastolic parameters are consistent with Grade II diastolic dysfunction  (pseudonormalization).   2. Right ventricular systolic function is normal. The right ventricular  size is moderately enlarged. Tricuspid regurgitation signal is inadequate  for assessing PA pressure.   3. Left atrial size was mildly dilated.   4. The mitral valve is normal in structure. Trivial mitral valve  regurgitation. No evidence of mitral stenosis.   5. The aortic valve was not well visualized. Aortic valve regurgitation  is not visualized. No aortic stenosis is present.   6. The inferior vena cava is dilated in size with <50% respiratory  variability, suggesting right atrial pressure of 15 mmHg.   7. Aortic dilatation noted. There is mild dilatation of the aortic root,  measuring 40 mm, but   8. Within normal limits for age when indexed to body surface area.      Neuro/Psych neg Seizures PSYCHIATRIC DISORDERS Anxiety      Neuromuscular disease    GI/Hepatic ,,,(+) neg Cirrhosis        Endo/Other  diabetes, Type 2    Renal/GU ARFRenal disease      Musculoskeletal  (+) Arthritis ,    Abdominal   Peds  Hematology Hg 13.7   Anesthesia Other Findings   Reproductive/Obstetrics                              Anesthesia Physical Anesthesia Plan  ASA: 3  Anesthesia Plan: General   Post-op Pain Management: Ofirmev  IV (intra-op)*   Induction:   PONV Risk Score and Plan: 2 and Treatment may vary due to age or medical condition, Ondansetron  and Dexamethasone   Airway Management Planned: Oral ETT  Additional Equipment: None  Intra-op Plan:   Post-operative Plan: Extubation in OR  Informed Consent: I have reviewed the patients History and Physical, chart, labs and discussed the procedure including the risks, benefits and alternatives for the proposed anesthesia with the patient or authorized representative who has indicated his/her understanding and acceptance.     Dental advisory given  Plan Discussed with: CRNA and Surgeon  Anesthesia Plan Comments:          Anesthesia Quick Evaluation

## 2024-01-02 NOTE — Plan of Care (Signed)
 Pt arrived alert oriented x 4. Complain of pain 10/10 on the left hip. Scheduled pain meds given. Advised pt NPO for upcoming surgery this AM. Pt verbalized understanding. All needs attended. Call light within reach. Plan of care ongoing.    Problem: Skin Integrity: Goal: Risk for impaired skin integrity will decrease Outcome: Progressing   Problem: Education: Goal: Knowledge of General Education information will improve Description: Including pain rating scale, medication(s)/side effects and non-pharmacologic comfort measures Outcome: Progressing   Problem: Clinical Measurements: Goal: Will remain free from infection Outcome: Progressing

## 2024-01-02 NOTE — Plan of Care (Signed)
  Problem: Coping: Goal: Ability to adjust to condition or change in health will improve Outcome: Progressing   Problem: Skin Integrity: Goal: Risk for impaired skin integrity will decrease Outcome: Progressing   Problem: Nutritional: Goal: Maintenance of adequate nutrition will improve Outcome: Progressing

## 2024-01-02 NOTE — Op Note (Signed)
 Orthopaedic Surgery Operative Note (CSN: [id number] ) Date of Surgery: 01/02/2024  Admit Date: 01/01/2024   Diagnoses: Pre-Op Diagnoses: Left periprosthetic femoral shaft fracture around previous hip nail   Post-Op Diagnosis: Same  Procedures: CPT 27506-Intramedullary nailing of left periprosthetic femoral shaft fracture CPT 27245-Revision fixation of left intertrochanteric femur fracture CPT 20680-Removal of hardware left proximal femur  Surgeons : Primary: Kendal Franky SQUIBB, MD  Assistant: Lauraine Moores, PA-C  Location: OR 5   Anesthesia: General   Antibiotics: Ancef  2g preop    Tourniquet time: None    Estimated Blood Loss: 150 mL  Complications:* No complications entered in OR log *   Specimens:* No specimens in log *   Implants: Implant Name Type Inv. Item Serial No. Manufacturer Lot No. LRB No. Used Action  NAIL LOCK CANN [id number] 130D LT - [id number] Nail NAIL LOCK CANN 10X380 130D LT  [employer] [id number] Left 1 Implanted  NAIL LESTER GAILS [id number] - [id number] Nail NAIL LESTER GAILS CY [employer] [id number] Left 1 Explanted  SCREW LAG COMPR KIT 105/100 - [id number] Screw SCREW LAG COMPR KIT 105/100  [employer] [id number] Left 1 Explanted  SCREW LAG COMPR KIT 105/100 - [id number] Screw SCREW LAG COMPR KIT 105/100  [employer] [id number] Left 1 Implanted     Indications for Surgery: 89 year old male who underwent cephalomedullary nailing of a left intertrochanteric femur fracture on November 17, 2023.  He subsequently had a ground-level fall and a periprosthetic femoral shaft fracture around his short hip nail.  Due to the unstable nature of his injury I recommend proceeding with revision fixation of his proximal femur with intramedullary nailing of the femoral shaft.  Risks and benefits were discussed with the patient.  Risks include but not limited to bleeding, infection, malunion, nonunion,  hardware failure, hardware rotation, nerve and blood vessel injury, DVT, even the possibility anesthetic complications.  He agreed to proceed with surgery and consent was obtained.  Operative Findings: 1.  Removal of previous short hip nail without complication. 2.  Revision fixation of left intertrochanteric femur fracture using [employer] InterTAN 10 x 380 mm nail with 105 mm lag screw and 100 mm compression screw 3.  Intramedullary nailing of left femoral shaft fracture using the above implant  Procedure: The patient was identified in the preoperative holding area. Consent was confirmed with the patient and their family and all questions were answered. The operative extremity was marked after confirmation with the patient. he was then brought back to the operating room by our anesthesia colleagues.  He was placed under general anesthetic and carefully transferred over to the [facility] table.  All bony prominences were well-padded.  Traction was applied to the left lower extremity.  Fluoroscopic imaging showed the unstable nature of his injury.  Traction was applied to align the fracture appropriately.  The left hip and leg were then prepped and draped in usual sterile fashion.  A timeout was performed to verify the patient, the procedure, and the extremity.  Preoperative antibiotics were dosed.  For started out by removing the distal interlock from the previous nail.  I was did this through the distal incision.  I then opened up the proximal incision and proceeded to thread in the conical extraction device to the proximal nail to get good fixation before I remove the lag screws.  Once I had that placed I then reopened the lateral incision and then proceeded to remove the compression screw  and then remove the lag screw.  The nail was then removed and further traction was applied to align the fracture appropriately.  I then placed a bent ball-tipped guidewire down the central canal and seated it into the  distal metaphysis.  Measured the length and chose to use a 38 cm nail.  I then sequentially reamed from 10 mm up to 11.5 mm.  I then placed a 10 x 380 mm nail down the center of the canal.  Aligned appropriately.  The previous nail was a 125 degree neck-shaft angle and I used a 130 degree nail.  I was able to guide the threaded guidewire through the nail and targeting arm into the previous lag screw path.  I placed an antirotation bar.  I then placed a 105 mm lag screw into the head and got excellent purchase.  I then placed a compression screw and compressed only 1 to 2 mm.  I then statically locked the proximal portion of the nail.  I then used a perfect circle technique to place distal interlocking screw from lateral to medial.  The targeting arm was removed and final fluoroscopic imaging was obtained.  The incisions were irrigated and closed with 2-0 Monocryl and Dermabond.  Sterile dressings were applied.  The patient was then awoken from anesthesia and taken to the PACU in stable condition.  Post Op Plan/Instructions: The patient be weightbearing as tolerated to the left lower extremity.  He will receive postoperative Ancef .  He will receive Lovenox  for DVT prophylaxis and discharged on an oral DOAC.  Will have him mobilize with physical and Occupational Therapy.  I was present and performed the entire surgery.  Lauraine Moores, PA-C did assist me throughout the case. An assistant was necessary given the difficulty in approach, maintenance of reduction and ability to instrument the fracture.   Franky Light, MD [facility]

## 2024-01-02 NOTE — Interval H&P Note (Signed)
 History and Physical Interval Note:  01/02/2024 8:54 AM  [name]  has presented today for surgery, with the diagnosis of Left periprosthetic proximal femur fracture.  The various methods of treatment have been discussed with the patient and family. After consideration of risks, benefits and other options for treatment, the patient has consented to  Procedure(s): FIXATION, FRACTURE, INTERTROCHANTERIC, WITH INTRAMEDULLARY ROD (Left) as a surgical intervention.  The patient's history has been reviewed, patient examined, no change in status, stable for surgery.  I have reviewed the patient's chart and labs.  Questions were answered to the patient's satisfaction.     Zamari Bonsall P Levita Monical

## 2024-01-02 NOTE — H&P (Signed)
 History and Physical    [name] FMW:[id number] DOB: November 25, 1934 DOA: 01/01/2024  PCP: [name], MD Patient coming from: Home  Chief Complaint: left hip pain  HPI: [name] is a 89 y.o. male with medical history significant of  anxiety, coronary artery disease, hyperlipidemia, type 2 diabetes on insulin , nephrolithiasis, BPH who presented to the hospital after mechanical fall at home. Patient states that he was armed with his walker and was headed into the living room when fell and hit his left hip.  According to his son, the patient went for MRI earlier in the day because he was noted to have a mass on kidney and they wanted further imaging. He returned home and eat lunch and then decided to head into the living room.  He also states that the patient had the same hip repaired recently and had recovered well from the earlier surgery.  ED Course:  In the ER, BP, HR, RR, O2 saturation,  and Tmax. Cbc was unremarkable. Chemistry demonstrated Na 135, K 3.5, Cl 100, bicarb 24, Bun/Cr 26/1.40 and glucose 242.  Hip Xray demonstrated  acute periprosthethic fracture int he proximal left femoral shaft with mild displacement. EKG demonstrated normal sinus rhythm.  Review of Systems:  All systems reviewed and apart from history of presenting illness, are negative.  Past Medical History:  Diagnosis Date   Anxiety    Borderline hyperlipidemia    CAD (coronary artery disease)    Colon polyps    Complication of anesthesia    trouble urinating after anesthesia   Diverticulosis of colon    DJD (degenerative joint disease)    DM (diabetes mellitus) (HCC)    Adult onset  type 2   History of gastritis    History of kidney stones    surgery to remove   History of pyelonephritis    History of syncope    HTN (hypertension)    Hypertrophy of prostate with urinary obstruction and other lower urinary tract symptoms (LUTS)    Melanoma (HCC)    back of neck   Melanoma (HCC) 2022    per dermatology   Myoclonus    Pancreatitis due to biliary obstruction (HCC) several years ago   treated by Dr. Princella Nida    Past Surgical History:  Procedure Laterality Date   APPENDECTOMY     cartarized  nose blood vessel     left side   CATARACT EXTRACTION     RIGHT EYE   CHOLECYSTECTOMY     cystoscopy  04/17/2011   stent placed   CYSTOSCOPY W/ URETERAL STENT PLACEMENT  04/17/2011   Procedure: CYSTOSCOPY WITH RETROGRADE PYELOGRAM/URETERAL STENT PLACEMENT;  Surgeon: Thomasine Oiler, MD;  Location: [facility];  Service: Urology;  Laterality: Left;   CYSTOSCOPY WITH RETROGRADE PYELOGRAM, URETEROSCOPY AND STENT PLACEMENT Left 04/18/2013   Procedure: CYSTOSCOPY WITH LEFT RETROGRADE PYELOGRAM, LEFT URETEROSCOPY AND LASER LITHOTRIPSY LEFT STENT PLACEMENT;  Surgeon: Noretta Ferrara, MD;  Location: [facility];  Service: Urology;  Laterality: Left;   HOLMIUM LASER APPLICATION Left 04/18/2013   Procedure: HOLMIUM LASER APPLICATION;  Surgeon: Noretta Ferrara, MD;  Location: [facility];  Service: Urology;  Laterality: Left;   INCISION AND DRAINAGE ABSCESS Left 04/04/2020   Procedure: SELBY OF LEFT ARM HEMATOMA;  Surgeon: Aron Shoulders, MD;  Location: [facility];  Service: General;  Laterality: Left;  left   INTRAMEDULLARY (IM) NAIL INTERTROCHANTERIC Left 11/17/2023   Procedure: FIXATION, FRACTURE, INTERTROCHANTERIC, WITH INTRAMEDULLARY ROD;  Surgeon: Sherida Adine BROCKS, MD;  Location: [facility];  Service: Orthopedics;  Laterality: Left;   IRRIGATION AND DEBRIDEMENT ABSCESS Left 10/05/2014   Procedure: WILHEMENA D LEFT INDEX FINGER;  Surgeon: Balinda Rogue, MD;  Location: [facility];  Service: Plastics;  Laterality: Left;   KNEE SURGERY     bilateral ARTHROSCOPY X2 TO EACH KNEE   MELANOMA EXCISION  2018   MELANOMA EXCISION WITH SENTINEL LYMPH NODE BIOPSY Left 03/29/2020   Procedure: WIDE LOCAL EXCISION, ADVANCED FLAP CLOSURE LEFT ARM MELANOMA DEFECT WITH SENTINEL LYMPH NODE MAPPING AND BIOPSY;  Surgeon: Aron Shoulders, MD;   Location: [facility];  Service: General;  Laterality: Left;   S/P cysto & stents for kidnedy stone 11/08 by Dr. Amiel     S/P ELap 1986 w/excision of leiomyoma @ GE junction, Meckle's divertic resected, & cholecystectomy     SHOULDER SURGERY  04/2005   left by Dr. Yvone     reports that he has never smoked. He has never been exposed to tobacco smoke. He has never used smokeless tobacco. He reports that he does not drink alcohol and does not use drugs.  Allergies  Allergen Reactions   Morphine  Nausea And Vomiting and Nausea Only    Can take with food and usually doesn't cause nausea   Atorvastatin      Intolerant - nausea/myalgias   Codeine Phosphate Nausea And Vomiting   Metformin  And Related Diarrhea    Family History  Problem Relation Age of Onset   Heart attack Mother    Dementia Brother    Colon cancer Neg Hx    Prostate cancer Neg Hx     Prior to Admission medications   Medication Sig Start Date End Date Taking? Authorizing Provider  amLODipine  (NORVASC ) 5 MG tablet Take 1 tablet (5 mg total) by mouth every morning. 12/02/23  Yes [name], MD  aspirin  EC 81 MG tablet Take 1 tablet (81 mg total) by mouth 2 (two) times daily. 11/24/23 01/05/24 Yes Ghimire, Renato, MD  carvedilol  (COREG ) 12.5 MG tablet TAKE 1 TABLET BY MOUTH TWICE  DAILY WITH MEALS 11/13/23  Yes [name], MD  COD LIVER OIL PO Take 1 capsule by mouth 2 (two) times daily.    Yes [provider]  cyanocobalamin  (VITAMIN B12) 1000 MCG tablet Take 1 tablet (1,000 mcg total) by mouth daily. 09/29/23  Yes [name], MD  diclofenac  Sodium (VOLTAREN ) 1 % GEL Apply 2 g topically 4 (four) times daily. 06/11/23  Yes Vann, Jessica U, DO  empagliflozin  (JARDIANCE ) 10 MG TABS tablet Take 1 tablet (10 mg total) by mouth daily before breakfast. Bill secondary: BIN [id number], PCN [id number], GRP [id number], ID [id number] 10/09/23  Yes [name], MD  escitalopram  (LEXAPRO ) 20 MG tablet Take 1 tablet (20 mg  total) by mouth daily. 09/29/23  Yes [name], MD  furosemide  (LASIX ) 40 MG tablet Take 1 tablet (40 mg total) by mouth daily. Can take an additional 20mg  as needed for weight gain of 2 lbs in a day or 5lbs in a week 06/23/23  Yes Wyn Jackee VEAR Mickey., NP  hydrALAZINE  (APRESOLINE ) 50 MG tablet TAKE 1 TABLET BY MOUTH 3 TIMES  DAILY 11/13/23  Yes [name], MD  insulin  lispro (HUMALOG  KWIKPEN) 100 UNIT/ML KwikPen INJECT SUBCUTANEOUSLY 4 TO 10  UNITS 3 TIMES DAILY BEFORE MEALS PER SLIDING SCALE: 151-200 = 4  UN, 201-250 = 6 UN, 251-300 = 8  UN, AND OVER 300 = 10 UN 07/08/23  Yes Cleatus Arlyss  S, MD  losartan  (COZAAR ) 25 MG tablet Take 1 tablet (25 mg total) by mouth daily. 04/17/23 04/16/24 Yes Pahwani, Fredia, MD  methocarbamol  (ROBAXIN ) 500 MG tablet Take 0.5 tablets (250 mg total) by mouth 4 (four) times daily. 11/24/23  Yes Raenelle Coria, MD  oxyCODONE  (OXY IR/ROXICODONE ) 5 MG immediate release tablet Take 1 tablet (5 mg total) by mouth every 6 (six) hours as needed for moderate pain (pain score 4-6) or severe pain (pain score 7-10). Don't take with tramadol . 12/18/23  Yes [name], MD  potassium chloride  SA (KLOR-CON  M) 20 MEQ tablet Take 2 tablets (40 mEq total) by mouth daily. Patient taking differently: Take 20 mEq by mouth 2 (two) times daily. 12/16/23  Yes [name], MD  senna-docusate (SENOKOT-S) 8.6-50 MG tablet Take 1 tablet by mouth 2 (two) times daily. Patient taking differently: Take 3 tablets by mouth daily at 6 (six) AM. 11/24/23  Yes Ghimire, Coria, MD  tamsulosin  (FLOMAX ) 0.4 MG CAPS capsule TAKE 1 CAPSULE BY MOUTH DAILY  AFTER SUPPER Patient taking differently: Take 0.4 mg by mouth 2 (two) times daily. 08/19/23  Yes [name], MD  [name]  SOLOSTAR 300 UNIT/ML Solostar Pen INJECT SUBCUTANEOUSLY 16 UNITS  AT BEDTIME 11/13/23  Yes [name], MD  traMADol  (ULTRAM ) 50 MG tablet TAKE 1 TABLET BY MOUTH EVERY 12 HOURS AS NEEDED FOR KNEE PAIN. Patient taking  differently: TAKE 2 TABLET BY MOUTH EVERY 12 HOURS AS NEEDED FOR KNEE PAIN. 09/29/23  Yes [name], MD  Accu-Chek Softclix Lancets lancets TEST BLOOD SUGAR UP TO THREE TIMES DAILY 01/23/20   [name], MD  Blood Glucose Calibration (ACCU-CHEK AVIVA) SOLN Use as directed monthly. E11.59  Insulin  dependent 12/06/18   [name], MD  Blood Glucose Monitoring Suppl (CONTOUR BLOOD GLUCOSE SYSTEM) w/Device KIT 1 each by Does not apply route 3 (three) times daily. 11/09/23   [name], MD  DROPLET PEN NEEDLES 30G X 8 MM MISC USE  WITH  INSULIN   PEN 02/03/19   [name], MD  glucose blood (CONTOUR TEST) test strip Use as instructed 11/09/23   [name], MD  meclizine  (ANTIVERT ) 12.5 MG tablet Take 1 tablet (12.5 mg total) by mouth 3 (three) times daily as needed for dizziness. Patient not taking: Reported on 01/02/2024 09/29/23   [name], MD  ondansetron  (ZOFRAN ) 4 MG tablet Take 1 tablet (4 mg total) by mouth every 6 (six) hours. Patient not taking: Reported on 01/02/2024 03/06/23   Kingsley, Victoria K, DO  polyethylene glycol (MIRALAX  / GLYCOLAX ) 17 g packet Take 17 g by mouth 2 (two) times daily. Patient not taking: Reported on 01/02/2024 11/24/23   Raenelle Coria, MD    Physical Exam: Vitals:   01/01/24 2115 01/01/24 2145 01/01/24 2330 01/02/24 0030  BP: (!) 187/89 (!) 179/73 (!) 160/78 (!) 181/75  Pulse: 70 67 71 75  Resp: (!) 22 (!) 25 (!) 22 18  Temp: 97.9 F (36.6 C)     TempSrc: Oral     SpO2: 100% 100% 99% 98%    Physical Exam Constitutional:      General: He is not in acute distress.    Appearance: Normal appearance.  HENT:     Head: Normocephalic and atraumatic.  Eyes:     Extraocular Movements: Extraocular movements intact.     Conjunctiva/sclera: Conjunctivae normal.     Pupils: Pupils are equal, round, and reactive to light.  Cardiovascular:  Rate and Rhythm: Normal rate and regular rhythm.     Pulses: Normal pulses.      Heart sounds: Normal heart sounds.  Pulmonary:     Effort: Pulmonary effort is normal. No respiratory distress.     Breath sounds: Normal breath sounds. No wheezing, rhonchi or rales.  Abdominal:     General: Abdomen is flat. Bowel sounds are normal. There is no distension.     Palpations: Abdomen is soft.     Tenderness: There is no abdominal tenderness.  Musculoskeletal:        General: tenderness on palpation of the left hip and decreased range of motion Skin:    General: Skin is warm and dry.     Coloration: Skin is not jaundiced.  Neurological:     General: No focal deficit present.     Mental Status: He is alert and oriented to person, place, and time. Mental status is at baseline.   Labs on Admission: I have personally reviewed following labs and imaging studies  CBC: Recent Labs  Lab 01/01/24 2147  WBC 9.0  NEUTROABS 5.6  HGB 13.7  HCT 41.0  MCV 92.8  PLT 333   Basic Metabolic Panel: Recent Labs  Lab 01/01/24 2147  NA 135  K 3.5  CL 100  CO2 24  GLUCOSE 242*  BUN 26*  CREATININE 1.40*  CALCIUM  9.5   GFR: CrCl cannot be calculated (Unknown ideal weight.). Liver Function Tests: No results for input(s): AST, ALT, ALKPHOS, BILITOT, PROT, ALBUMIN  in the last 168 hours. No results for input(s): LIPASE, AMYLASE in the last 168 hours. No results for input(s): AMMONIA in the last 168 hours. Coagulation Profile: Recent Labs  Lab 01/01/24 2147  INR 0.9   Cardiac Enzymes: No results for input(s): CKTOTAL, CKMB, CKMBINDEX, TROPONINI in the last 168 hours. BNP (last 3 results) No results for input(s): PROBNP in the last 8760 hours. HbA1C: No results for input(s): HGBA1C in the last 72 hours. CBG: No results for input(s): GLUCAP in the last 168 hours. Lipid Profile: No results for input(s): CHOL, HDL, LDLCALC, TRIG, CHOLHDL, LDLDIRECT in the last 72 hours. Thyroid  Function Tests: No results for input(s): TSH,  T4TOTAL, FREET4, T3FREE, THYROIDAB in the last 72 hours. Anemia Panel: No results for input(s): VITAMINB12, FOLATE, FERRITIN, TIBC, IRON, RETICCTPCT in the last 72 hours. Urine analysis:    Component Value Date/Time   COLORURINE STRAW (A) 06/08/2023 1448   APPEARANCEUR CLEAR 06/08/2023 1448   LABSPEC 1.009 06/08/2023 1448   PHURINE 8.0 06/08/2023 1448   GLUCOSEU NEGATIVE 06/08/2023 1448   HGBUR SMALL (A) 06/08/2023 1448   BILIRUBINUR NEGATIVE 06/08/2023 1448   KETONESUR NEGATIVE 06/08/2023 1448   PROTEINUR 100 (A) 06/08/2023 1448   UROBILINOGEN 0.2 02/02/2014 1509   NITRITE NEGATIVE 06/08/2023 1448   LEUKOCYTESUR SMALL (A) 06/08/2023 1448    Radiological Exams on Admission: DG Hip Unilat With Pelvis 2-3 Views Left Result Date: 01/01/2024 EXAM: 2 or 3 VIEW(S) XRAY OF THE LEFT HIP 01/01/2024 10:47:47 PM COMPARISON: 11/16/2023 CLINICAL HISTORY: fall, pain FINDINGS: BONES AND JOINTS: Prior internal fixation across the left femoral intertrochanteric fracture. Acute periprosthetic fracture in the proximal left femoral shaft with mild displacement. No dislocation. SOFT TISSUES: The soft tissues are unremarkable. IMPRESSION: 1. Acute periprosthetic fracture in the proximal left femoral shaft with mild displacement. Electronically signed by: Franky Crease MD 01/01/2024 10:52 PM EST RP Workstation: [id number]   CT CERVICAL SPINE WO CONTRAST Result Date: 01/01/2024 EXAM: CT CERVICAL SPINE WITHOUT CONTRAST  01/01/2024 10:12:00 PM TECHNIQUE: CT of the cervical spine was performed without the administration of intravenous contrast. Multiplanar reformatted images are provided for review. Automated exposure control, iterative reconstruction, and/or weight based adjustment of the mA/kV was utilized to reduce the radiation dose to as low as reasonably achievable. COMPARISON: 11/16/2023 CLINICAL HISTORY: Neck trauma (Age >= 65y) FINDINGS: CERVICAL SPINE: BONES AND ALIGNMENT: No acute  fracture or traumatic malalignment. DEGENERATIVE CHANGES: Advanced bilateral degenerative facet disease diffusely. Large flowing anterior osteophytes throughout the cervical spine. SOFT TISSUES: No prevertebral soft tissue swelling. IMPRESSION: 1. No acute abnormality of the cervical spine related to the reported neck trauma. Electronically signed by: Franky Crease MD 01/01/2024 10:34 PM EST RP Workstation: [id number]   CT HEAD WO CONTRAST Result Date: 01/01/2024 EXAM: CT HEAD WITHOUT CONTRAST 01/01/2024 10:12:00 PM TECHNIQUE: CT of the head was performed without the administration of intravenous contrast. Automated exposure control, iterative reconstruction, and/or weight based adjustment of the mA/kV was utilized to reduce the radiation dose to as low as reasonably achievable. COMPARISON: 11/16/2023 CLINICAL HISTORY: Head trauma, minor (Age >= 65y) FINDINGS: BRAIN AND VENTRICLES: There is atrophy and chronic small vessel disease throughout the deep white matter. No acute hemorrhage. No evidence of acute infarct. No hydrocephalus. No extra-axial collection. No mass effect or midline shift. ORBITS: No acute abnormality. SINUSES: No acute abnormality. SOFT TISSUES AND SKULL: No acute soft tissue abnormality. No skull fracture. IMPRESSION: 1. No acute intracranial abnormality. 2. Chronic atrophy and small vessel ischemic changes in the deep white matter. Electronically signed by: Franky Crease MD 01/01/2024 10:33 PM EST RP Workstation: [id number]    EKG: Independently reviewed.   Assessment/Plan Principal Problem:   Closed left hip fracture, initial encounter (HCC)  Closed left hip fracture Will give multimodal pain medication Will consult orthopedic surgery  Combined systolic and diastolic heart failure HTN Stable at this time Will continue coreg  12.5 mg BID Will continue lasix  40 mg daily Will continue norvasc  5 mg daily   Losartan  25 mg daily   HLD Will restart statin therapy   Dm type  2 Patient will be started on low dose sliding scale insulin  Lantus  16  QHS     DVT prophylaxis: scds and lovenox  Code Status: full  Family Communication:   QUINCEY, QUESINBERRY (Son) [phone] (Mobile)   Disposition Plan:  Patient class is not currently inpatient or observation. Update patient class prior to completing documentation    Consults called: orthopedic surgery  Admission status: inpatient  Level of care: Level of care:  The medical decision making on this patient was of high complexity and the patient is at high risk for clinical deterioration, therefore this is a level 3 visit.  The medical decision making is of moderate complexity, therefore this is a level 2 visit.  Bradly MARLA Drones MD [facility]  If 7PM-7AM, please contact night-coverage [email or web address]  01/02/2024, 12:57 AM

## 2024-01-02 NOTE — Anesthesia Postprocedure Evaluation (Signed)
 Anesthesia Post Note  [name]  Procedure(s) Performed: FIXATION, FRACTURE, INTERTROCHANTERIC, WITH INTRAMEDULLARY ROD (Left)     Patient location during evaluation: PACU Anesthesia Type: General Level of consciousness: awake and alert Pain management: pain level controlled Vital Signs Assessment: post-procedure vital signs reviewed and stable Respiratory status: spontaneous breathing, nonlabored ventilation, respiratory function stable and patient connected to nasal cannula oxygen Cardiovascular status: blood pressure returned to baseline and stable Postop Assessment: no apparent nausea or vomiting Anesthetic complications: no   There were no known notable events for this encounter.  Last Vitals:  Vitals:   01/02/24 1200 01/02/24 1222  BP: 138/62 135/62  Pulse: 61 62  Resp: (!) 23 16  Temp: 37.2 C 36.8 C  SpO2: 97% 100%    Last Pain:  Vitals:   01/02/24 1358  TempSrc:   PainSc: 10-Worst pain ever                 Thom JONELLE Peoples

## 2024-01-02 NOTE — H&P (View-Only) (Signed)
 Orthopaedic Trauma Service (OTS) Consult   Patient ID: [name] MRN: [id number] DOB/AGE: 1934/07/16 89 y.o.  Reason for Consult:Periprosthetic left femur fracture Referring Physician: Dr. Medford Pae, MD [name]  HPI: [name] is an 89 y.o. male who is being seen in consultation at the request of Dr. Pae for evaluation of left periprosthetic femur fracture.  Patient had a intertrochanteric femur fracture fixed by Dr. Sherida in early October.  Sustained a ground-level fall yesterday with a periprosthetic fracture extending from the distal interlock up into the intertrochanteric region.  His implant was unstable and I was consulted due to the complexity of his injury and need for orthopedic traumatology.  Patient lives at home with his son.  He was able to mobilize with a walker.  He is discharged from [facility].  Otherwise in his baseline health.  Denies any other major medical issues recently.  He had an MRI of his kidney that was performed yesterday.  Past Medical History:  Diagnosis Date   Anxiety    Borderline hyperlipidemia    CAD (coronary artery disease)    Colon polyps    Complication of anesthesia    trouble urinating after anesthesia   Diverticulosis of colon    DJD (degenerative joint disease)    DM (diabetes mellitus) (HCC)    Adult onset  type 2   History of gastritis    History of kidney stones    surgery to remove   History of pyelonephritis    History of syncope    HTN (hypertension)    Hypertrophy of prostate with urinary obstruction and other lower urinary tract symptoms (LUTS)    Melanoma (HCC)    back of neck   Melanoma (HCC) 2022   per dermatology   Myoclonus    Pancreatitis due to biliary obstruction (HCC) several years ago   treated by Dr. Princella Nida    Past Surgical History:  Procedure Laterality Date   APPENDECTOMY     cartarized  nose blood vessel     left side   CATARACT EXTRACTION     RIGHT EYE   CHOLECYSTECTOMY      cystoscopy  04/17/2011   stent placed   CYSTOSCOPY W/ URETERAL STENT PLACEMENT  04/17/2011   Procedure: CYSTOSCOPY WITH RETROGRADE PYELOGRAM/URETERAL STENT PLACEMENT;  Surgeon: Thomasine Oiler, MD;  Location: [facility];  Service: Urology;  Laterality: Left;   CYSTOSCOPY WITH RETROGRADE PYELOGRAM, URETEROSCOPY AND STENT PLACEMENT Left 04/18/2013   Procedure: CYSTOSCOPY WITH LEFT RETROGRADE PYELOGRAM, LEFT URETEROSCOPY AND LASER LITHOTRIPSY LEFT STENT PLACEMENT;  Surgeon: Noretta Ferrara, MD;  Location: [facility];  Service: Urology;  Laterality: Left;   HOLMIUM LASER APPLICATION Left 04/18/2013   Procedure: HOLMIUM LASER APPLICATION;  Surgeon: Noretta Ferrara, MD;  Location: [facility];  Service: Urology;  Laterality: Left;   INCISION AND DRAINAGE ABSCESS Left 04/04/2020   Procedure: SELBY OF LEFT ARM HEMATOMA;  Surgeon: Aron Shoulders, MD;  Location: [facility];  Service: General;  Laterality: Left;  left   INTRAMEDULLARY (IM) NAIL INTERTROCHANTERIC Left 11/17/2023   Procedure: FIXATION, FRACTURE, INTERTROCHANTERIC, WITH INTRAMEDULLARY ROD;  Surgeon: Sherida Adine BROCKS, MD;  Location: [facility];  Service: Orthopedics;  Laterality: Left;   IRRIGATION AND DEBRIDEMENT ABSCESS Left 10/05/2014   Procedure: WILHEMENA D LEFT INDEX FINGER;  Surgeon: Balinda Rogue, MD;  Location: [facility];  Service: Plastics;  Laterality: Left;   KNEE SURGERY     bilateral ARTHROSCOPY X2 TO EACH KNEE   MELANOMA EXCISION  2018  MELANOMA EXCISION WITH SENTINEL LYMPH NODE BIOPSY Left 03/29/2020   Procedure: WIDE LOCAL EXCISION, ADVANCED FLAP CLOSURE LEFT ARM MELANOMA DEFECT WITH SENTINEL LYMPH NODE MAPPING AND BIOPSY;  Surgeon: Aron Shoulders, MD;  Location: [facility];  Service: General;  Laterality: Left;   S/P cysto & stents for kidnedy stone 11/08 by Dr. Amiel     S/P ELap 1986 w/excision of leiomyoma @ GE junction, Meckle's divertic resected, & cholecystectomy     SHOULDER SURGERY  04/2005   left by Dr. Yvone    Family History  Problem Relation Age  of Onset   Heart attack Mother    Dementia Brother    Colon cancer Neg Hx    Prostate cancer Neg Hx     Social History:  reports that he has never smoked. He has never been exposed to tobacco smoke. He has never used smokeless tobacco. He reports that he does not drink alcohol and does not use drugs.  Allergies:  Allergies  Allergen Reactions   Morphine  Nausea And Vomiting and Nausea Only    Can take with food and usually doesn't cause nausea   Atorvastatin      Intolerant - nausea/myalgias   Codeine Phosphate Nausea And Vomiting   Metformin  And Related Diarrhea    Medications:  No current facility-administered medications on file prior to encounter.   Current Outpatient Medications on File Prior to Encounter  Medication Sig Dispense Refill   amLODipine  (NORVASC ) 5 MG tablet Take 1 tablet (5 mg total) by mouth every morning. 100 tablet 1   aspirin  EC 81 MG tablet Take 1 tablet (81 mg total) by mouth 2 (two) times daily.     carvedilol  (COREG ) 12.5 MG tablet TAKE 1 TABLET BY MOUTH TWICE  DAILY WITH MEALS 200 tablet 2   COD LIVER OIL PO Take 1 capsule by mouth 2 (two) times daily.      cyanocobalamin  (VITAMIN B12) 1000 MCG tablet Take 1 tablet (1,000 mcg total) by mouth daily.     diclofenac  Sodium (VOLTAREN ) 1 % GEL Apply 2 g topically 4 (four) times daily.     empagliflozin  (JARDIANCE ) 10 MG TABS tablet Take 1 tablet (10 mg total) by mouth daily before breakfast. Bill secondary: BIN [id number], PCN [id number], GRP [id number], ID [id number] 90 tablet 3   escitalopram  (LEXAPRO ) 20 MG tablet Take 1 tablet (20 mg total) by mouth daily. 90 tablet 1   furosemide  (LASIX ) 40 MG tablet Take 1 tablet (40 mg total) by mouth daily. Can take an additional 20mg  as needed for weight gain of 2 lbs in a day or 5lbs in a week 45 tablet 1   hydrALAZINE  (APRESOLINE ) 50 MG tablet TAKE 1 TABLET BY MOUTH 3 TIMES  DAILY 300 tablet 2   insulin  lispro (HUMALOG  KWIKPEN) 100 UNIT/ML KwikPen INJECT SUBCUTANEOUSLY 4 TO 10   UNITS 3 TIMES DAILY BEFORE MEALS PER SLIDING SCALE: 151-200 = 4  UN, 201-250 = 6 UN, 251-300 = 8  UN, AND OVER 300 = 10 UN 30 mL 2   losartan  (COZAAR ) 25 MG tablet Take 1 tablet (25 mg total) by mouth daily. 30 tablet 0   methocarbamol  (ROBAXIN ) 500 MG tablet Take 0.5 tablets (250 mg total) by mouth 4 (four) times daily.     oxyCODONE  (OXY IR/ROXICODONE ) 5 MG immediate release tablet Take 1 tablet (5 mg total) by mouth every 6 (six) hours as needed for moderate pain (pain score 4-6) or severe pain (pain score 7-10). Don't take with tramadol . 30  tablet 0   potassium chloride  SA (KLOR-CON  M) 20 MEQ tablet Take 2 tablets (40 mEq total) by mouth daily. (Patient taking differently: Take 20 mEq by mouth 2 (two) times daily.) 180 tablet 0   senna-docusate (SENOKOT-S) 8.6-50 MG tablet Take 1 tablet by mouth 2 (two) times daily. (Patient taking differently: Take 3 tablets by mouth daily at 6 (six) AM.)     tamsulosin  (FLOMAX ) 0.4 MG CAPS capsule TAKE 1 CAPSULE BY MOUTH DAILY  AFTER SUPPER (Patient taking differently: Take 0.4 mg by mouth 2 (two) times daily.) 100 capsule 2   [name]  SOLOSTAR 300 UNIT/ML Solostar Pen INJECT SUBCUTANEOUSLY 16 UNITS  AT BEDTIME 9 mL 2   traMADol  (ULTRAM ) 50 MG tablet TAKE 1 TABLET BY MOUTH EVERY 12 HOURS AS NEEDED FOR KNEE PAIN. (Patient taking differently: TAKE 2 TABLET BY MOUTH EVERY 12 HOURS AS NEEDED FOR KNEE PAIN.) 60 tablet 3   Accu-Chek Softclix Lancets lancets TEST BLOOD SUGAR UP TO THREE TIMES DAILY 300 each 3   Blood Glucose Calibration (ACCU-CHEK AVIVA) SOLN Use as directed monthly. E11.59  Insulin  dependent 1 each 3   Blood Glucose Monitoring Suppl (CONTOUR BLOOD GLUCOSE SYSTEM) w/Device KIT 1 each by Does not apply route 3 (three) times daily. 1 kit 0   DROPLET PEN NEEDLES 30G X 8 MM MISC USE  WITH  INSULIN   PEN 400 each 5   glucose blood (CONTOUR TEST) test strip Use as instructed 100 each 12   meclizine  (ANTIVERT ) 12.5 MG tablet Take 1 tablet (12.5 mg total) by  mouth 3 (three) times daily as needed for dizziness. (Patient not taking: Reported on 01/02/2024) 90 tablet 1   ondansetron  (ZOFRAN ) 4 MG tablet Take 1 tablet (4 mg total) by mouth every 6 (six) hours. (Patient not taking: Reported on 01/02/2024) 12 tablet 0   polyethylene glycol (MIRALAX  / GLYCOLAX ) 17 g packet Take 17 g by mouth 2 (two) times daily. (Patient not taking: Reported on 01/02/2024)       ROS: Constitutional: No fever or chills Vision: No changes in vision ENT: No difficulty swallowing CV: No chest pain Pulm: No SOB or wheezing GI: No nausea or vomiting GU: No urgency or inability to hold urine Skin: No poor wound healing Neurologic: No numbness or tingling Psychiatric: No depression or anxiety Heme: No bruising Allergic: No reaction to medications or food   Exam: Blood pressure (!) 143/69, pulse 76, temperature 98.2 F (36.8 C), temperature source Oral, resp. rate 18, height 5' 11 (1.803 m), weight 94.3 kg, SpO2 94%. General: No acute distress Orientation: Awake alert and oriented x 3 Mood and Affect: Cooperative and pleasant Gait: Unable to assess due to his fracture Coordination and balance: Within normal limits  Left lower extremity: Leg is shortened and externally rotated.  Previous surgical incisions are present.  They are healed well.  Thigh compartments are soft and compressible.  He has active dorsiflexion plantarflexion foot and ankle he is warm well-perfused foot with brisk cap refill.  Right lower extremity: Skin without lesions. No tenderness to palpation. Full painless ROM, full strength in each muscle groups without evidence of instability.   Medical Decision Making: Data: Imaging: X-rays have been reviewed which shows a periprosthetic proximal femur fracture between the previous distal interlock in his intertrochanteric femur fracture.  Labs:  Results for orders placed or performed during the hospital encounter of 01/01/24 (from the past 24 hours)   Basic metabolic panel     Status: Abnormal   Collection Time:  01/01/24  9:47 PM  Result Value Ref Range   Sodium 135 135 - 145 mmol/L   Potassium 3.5 3.5 - 5.1 mmol/L   Chloride 100 98 - 111 mmol/L   CO2 24 22 - 32 mmol/L   Glucose, Bld 242 (H) 70 - 99 mg/dL   BUN 26 (H) 8 - 23 mg/dL   Creatinine, Ser 8.59 (H) 0.61 - 1.24 mg/dL   Calcium  9.5 8.9 - 10.3 mg/dL   GFR, Estimated 49 (L) >60 mL/min   Anion gap 11 5 - 15  CBC with Differential     Status: None   Collection Time: 01/01/24  9:47 PM  Result Value Ref Range   WBC 9.0 4.0 - 10.5 K/uL   RBC 4.42 4.22 - 5.81 MIL/uL   Hemoglobin 13.7 13.0 - 17.0 g/dL   HCT 58.9 60.9 - 47.9 %   MCV 92.8 80.0 - 100.0 fL   MCH 31.0 26.0 - 34.0 pg   MCHC 33.4 30.0 - 36.0 g/dL   RDW 85.9 88.4 - 84.4 %   Platelets 333 150 - 400 K/uL   nRBC 0.0 0.0 - 0.2 %   Neutrophils Relative % 61 %   Neutro Abs 5.6 1.7 - 7.7 K/uL   Lymphocytes Relative 29 %   Lymphs Abs 2.6 0.7 - 4.0 K/uL   Monocytes Relative 6 %   Monocytes Absolute 0.6 0.1 - 1.0 K/uL   Eosinophils Relative 2 %   Eosinophils Absolute 0.2 0.0 - 0.5 K/uL   Basophils Relative 1 %   Basophils Absolute 0.1 0.0 - 0.1 K/uL   Immature Granulocytes 1 %   Abs Immature Granulocytes 0.05 0.00 - 0.07 K/uL  Protime-INR     Status: None   Collection Time: 01/01/24  9:47 PM  Result Value Ref Range   Prothrombin Time 13.2 11.4 - 15.2 seconds   INR 0.9 0.8 - 1.2  Type and screen [facility]     Status: None   Collection Time: 01/01/24  9:47 PM  Result Value Ref Range   ABO/RH(D) O POS    Antibody Screen NEG    Sample Expiration      01/04/2024,2359 Performed at [facility], [address], [place], [place] [address]   Glucose, capillary     Status: Abnormal   Collection Time: 01/02/24  2:38 AM  Result Value Ref Range   Glucose-Capillary 239 (H) 70 - 99 mg/dL  Surgical pcr screen     Status: None   Collection Time: 01/02/24  2:59 AM   Specimen: Nasal Mucosa; Nasal Swab   Result Value Ref Range   MRSA, PCR NEGATIVE NEGATIVE   Staphylococcus aureus NEGATIVE NEGATIVE  Glucose, capillary     Status: Abnormal   Collection Time: 01/02/24  7:28 AM  Result Value Ref Range   Glucose-Capillary 230 (H) 70 - 99 mg/dL   Comment 1 Notify RN      Imaging or Labs ordered: None  Medical history and chart was reviewed and case discussed with medical provider.  Assessment/Plan: 89 year old male with periprosthetic proximal femur fracture.  Due to the location and unstable nature of his injury he will need revision fixation of his femur.  Will likely remove his previous nail and perform renailing with a longer nail.  Risks and benefits were discussed with the patient.  Risks include but not limited to bleeding, infection, malunion, nonunion, periprosthetic fracture, hardware failure, hardware loosening, nerve or blood vessel injury, DVT, even the possibility  anesthetic complications.  He agreed to proceed with surgery and consent was obtained.  Surgery w/ risks or Emergency surgery: High complexity Risk (Level 5)  Franky MYRTIS Light, MD [facility] [phone] (office) [email or web address]

## 2024-01-02 NOTE — Transfer of Care (Signed)
 Immediate Anesthesia Transfer of Care Note  [name]  Procedure(s) Performed: FIXATION, FRACTURE, INTERTROCHANTERIC, WITH INTRAMEDULLARY ROD (Left)  Patient Location: PACU  Anesthesia Type:General  Level of Consciousness: awake, drowsy, patient cooperative, and responds to stimulation  Airway & Oxygen Therapy: Patient Spontanous Breathing  Post-op Assessment: Report given to RN and Post -op Vital signs reviewed and stable  Post vital signs: Reviewed and stable  Last Vitals:  Vitals Value Taken Time  BP 121/57 01/02/24 11:01  Temp 37.2 C 01/02/24 11:01  Pulse 61 01/02/24 11:03  Resp 26 01/02/24 11:03  SpO2 93 % 01/02/24 11:03  Vitals shown include unfiled device data.  Last Pain:  Vitals:   01/02/24 1101  TempSrc:   PainSc: 0-No pain         Complications: No notable events documented.

## 2024-01-02 NOTE — Care Plan (Signed)
 This 89 yrs old Male with PMH significant for anxiety, coronary artery disease, hyperlipidemia, type 2 diabetes on insulin , Nephrolithiasis, BPH presented in the ED status post mechanical fall at home.  According to the son patient went for an MRI earlier in the day because he was noted to have a mass on the kidney and they wanted further imaging. He returned home and ate lunch and then decided to head to the living room and then fell and hit the left hip.  He was unable to stand.  X-ray hip shows acute periprosthetic fracture into the proximal left femoral shaft with mild displacement.  Patient was admitted for further evaluation.  Orthopedics is consulted. Patient is scheduled for ORIF today.

## 2024-01-02 NOTE — Consult Note (Signed)
 Orthopaedic Trauma Service (OTS) Consult   Patient ID: [name] MRN: [id number] DOB/AGE: 1934/07/16 89 y.o.  Reason for Consult:Periprosthetic left femur fracture Referring Physician: Dr. Medford Pae, MD [name]  HPI: [name] is an 89 y.o. male who is being seen in consultation at the request of Dr. Pae for evaluation of left periprosthetic femur fracture.  Patient had a intertrochanteric femur fracture fixed by Dr. Sherida in early October.  Sustained a ground-level fall yesterday with a periprosthetic fracture extending from the distal interlock up into the intertrochanteric region.  His implant was unstable and I was consulted due to the complexity of his injury and need for orthopedic traumatology.  Patient lives at home with his son.  He was able to mobilize with a walker.  He is discharged from [facility].  Otherwise in his baseline health.  Denies any other major medical issues recently.  He had an MRI of his kidney that was performed yesterday.  Past Medical History:  Diagnosis Date   Anxiety    Borderline hyperlipidemia    CAD (coronary artery disease)    Colon polyps    Complication of anesthesia    trouble urinating after anesthesia   Diverticulosis of colon    DJD (degenerative joint disease)    DM (diabetes mellitus) (HCC)    Adult onset  type 2   History of gastritis    History of kidney stones    surgery to remove   History of pyelonephritis    History of syncope    HTN (hypertension)    Hypertrophy of prostate with urinary obstruction and other lower urinary tract symptoms (LUTS)    Melanoma (HCC)    back of neck   Melanoma (HCC) 2022   per dermatology   Myoclonus    Pancreatitis due to biliary obstruction (HCC) several years ago   treated by Dr. Princella Nida    Past Surgical History:  Procedure Laterality Date   APPENDECTOMY     cartarized  nose blood vessel     left side   CATARACT EXTRACTION     RIGHT EYE   CHOLECYSTECTOMY      cystoscopy  04/17/2011   stent placed   CYSTOSCOPY W/ URETERAL STENT PLACEMENT  04/17/2011   Procedure: CYSTOSCOPY WITH RETROGRADE PYELOGRAM/URETERAL STENT PLACEMENT;  Surgeon: Thomasine Oiler, MD;  Location: [facility];  Service: Urology;  Laterality: Left;   CYSTOSCOPY WITH RETROGRADE PYELOGRAM, URETEROSCOPY AND STENT PLACEMENT Left 04/18/2013   Procedure: CYSTOSCOPY WITH LEFT RETROGRADE PYELOGRAM, LEFT URETEROSCOPY AND LASER LITHOTRIPSY LEFT STENT PLACEMENT;  Surgeon: Noretta Ferrara, MD;  Location: [facility];  Service: Urology;  Laterality: Left;   HOLMIUM LASER APPLICATION Left 04/18/2013   Procedure: HOLMIUM LASER APPLICATION;  Surgeon: Noretta Ferrara, MD;  Location: [facility];  Service: Urology;  Laterality: Left;   INCISION AND DRAINAGE ABSCESS Left 04/04/2020   Procedure: SELBY OF LEFT ARM HEMATOMA;  Surgeon: Aron Shoulders, MD;  Location: [facility];  Service: General;  Laterality: Left;  left   INTRAMEDULLARY (IM) NAIL INTERTROCHANTERIC Left 11/17/2023   Procedure: FIXATION, FRACTURE, INTERTROCHANTERIC, WITH INTRAMEDULLARY ROD;  Surgeon: Sherida Adine BROCKS, MD;  Location: [facility];  Service: Orthopedics;  Laterality: Left;   IRRIGATION AND DEBRIDEMENT ABSCESS Left 10/05/2014   Procedure: WILHEMENA D LEFT INDEX FINGER;  Surgeon: Balinda Rogue, MD;  Location: [facility];  Service: Plastics;  Laterality: Left;   KNEE SURGERY     bilateral ARTHROSCOPY X2 TO EACH KNEE   MELANOMA EXCISION  2018  MELANOMA EXCISION WITH SENTINEL LYMPH NODE BIOPSY Left 03/29/2020   Procedure: WIDE LOCAL EXCISION, ADVANCED FLAP CLOSURE LEFT ARM MELANOMA DEFECT WITH SENTINEL LYMPH NODE MAPPING AND BIOPSY;  Surgeon: Aron Shoulders, MD;  Location: [facility];  Service: General;  Laterality: Left;   S/P cysto & stents for kidnedy stone 11/08 by Dr. Amiel     S/P ELap 1986 w/excision of leiomyoma @ GE junction, Meckle's divertic resected, & cholecystectomy     SHOULDER SURGERY  04/2005   left by Dr. Yvone    Family History  Problem Relation Age  of Onset   Heart attack Mother    Dementia Brother    Colon cancer Neg Hx    Prostate cancer Neg Hx     Social History:  reports that he has never smoked. He has never been exposed to tobacco smoke. He has never used smokeless tobacco. He reports that he does not drink alcohol and does not use drugs.  Allergies:  Allergies  Allergen Reactions   Morphine  Nausea And Vomiting and Nausea Only    Can take with food and usually doesn't cause nausea   Atorvastatin      Intolerant - nausea/myalgias   Codeine Phosphate Nausea And Vomiting   Metformin  And Related Diarrhea    Medications:  No current facility-administered medications on file prior to encounter.   Current Outpatient Medications on File Prior to Encounter  Medication Sig Dispense Refill   amLODipine  (NORVASC ) 5 MG tablet Take 1 tablet (5 mg total) by mouth every morning. 100 tablet 1   aspirin  EC 81 MG tablet Take 1 tablet (81 mg total) by mouth 2 (two) times daily.     carvedilol  (COREG ) 12.5 MG tablet TAKE 1 TABLET BY MOUTH TWICE  DAILY WITH MEALS 200 tablet 2   COD LIVER OIL PO Take 1 capsule by mouth 2 (two) times daily.      cyanocobalamin  (VITAMIN B12) 1000 MCG tablet Take 1 tablet (1,000 mcg total) by mouth daily.     diclofenac  Sodium (VOLTAREN ) 1 % GEL Apply 2 g topically 4 (four) times daily.     empagliflozin  (JARDIANCE ) 10 MG TABS tablet Take 1 tablet (10 mg total) by mouth daily before breakfast. Bill secondary: BIN [id number], PCN [id number], GRP [id number], ID [id number] 90 tablet 3   escitalopram  (LEXAPRO ) 20 MG tablet Take 1 tablet (20 mg total) by mouth daily. 90 tablet 1   furosemide  (LASIX ) 40 MG tablet Take 1 tablet (40 mg total) by mouth daily. Can take an additional 20mg  as needed for weight gain of 2 lbs in a day or 5lbs in a week 45 tablet 1   hydrALAZINE  (APRESOLINE ) 50 MG tablet TAKE 1 TABLET BY MOUTH 3 TIMES  DAILY 300 tablet 2   insulin  lispro (HUMALOG  KWIKPEN) 100 UNIT/ML KwikPen INJECT SUBCUTANEOUSLY 4 TO 10   UNITS 3 TIMES DAILY BEFORE MEALS PER SLIDING SCALE: 151-200 = 4  UN, 201-250 = 6 UN, 251-300 = 8  UN, AND OVER 300 = 10 UN 30 mL 2   losartan  (COZAAR ) 25 MG tablet Take 1 tablet (25 mg total) by mouth daily. 30 tablet 0   methocarbamol  (ROBAXIN ) 500 MG tablet Take 0.5 tablets (250 mg total) by mouth 4 (four) times daily.     oxyCODONE  (OXY IR/ROXICODONE ) 5 MG immediate release tablet Take 1 tablet (5 mg total) by mouth every 6 (six) hours as needed for moderate pain (pain score 4-6) or severe pain (pain score 7-10). Don't take with tramadol . 30  tablet 0   potassium chloride  SA (KLOR-CON  M) 20 MEQ tablet Take 2 tablets (40 mEq total) by mouth daily. (Patient taking differently: Take 20 mEq by mouth 2 (two) times daily.) 180 tablet 0   senna-docusate (SENOKOT-S) 8.6-50 MG tablet Take 1 tablet by mouth 2 (two) times daily. (Patient taking differently: Take 3 tablets by mouth daily at 6 (six) AM.)     tamsulosin  (FLOMAX ) 0.4 MG CAPS capsule TAKE 1 CAPSULE BY MOUTH DAILY  AFTER SUPPER (Patient taking differently: Take 0.4 mg by mouth 2 (two) times daily.) 100 capsule 2   [name]  SOLOSTAR 300 UNIT/ML Solostar Pen INJECT SUBCUTANEOUSLY 16 UNITS  AT BEDTIME 9 mL 2   traMADol  (ULTRAM ) 50 MG tablet TAKE 1 TABLET BY MOUTH EVERY 12 HOURS AS NEEDED FOR KNEE PAIN. (Patient taking differently: TAKE 2 TABLET BY MOUTH EVERY 12 HOURS AS NEEDED FOR KNEE PAIN.) 60 tablet 3   Accu-Chek Softclix Lancets lancets TEST BLOOD SUGAR UP TO THREE TIMES DAILY 300 each 3   Blood Glucose Calibration (ACCU-CHEK AVIVA) SOLN Use as directed monthly. E11.59  Insulin  dependent 1 each 3   Blood Glucose Monitoring Suppl (CONTOUR BLOOD GLUCOSE SYSTEM) w/Device KIT 1 each by Does not apply route 3 (three) times daily. 1 kit 0   DROPLET PEN NEEDLES 30G X 8 MM MISC USE  WITH  INSULIN   PEN 400 each 5   glucose blood (CONTOUR TEST) test strip Use as instructed 100 each 12   meclizine  (ANTIVERT ) 12.5 MG tablet Take 1 tablet (12.5 mg total) by  mouth 3 (three) times daily as needed for dizziness. (Patient not taking: Reported on 01/02/2024) 90 tablet 1   ondansetron  (ZOFRAN ) 4 MG tablet Take 1 tablet (4 mg total) by mouth every 6 (six) hours. (Patient not taking: Reported on 01/02/2024) 12 tablet 0   polyethylene glycol (MIRALAX  / GLYCOLAX ) 17 g packet Take 17 g by mouth 2 (two) times daily. (Patient not taking: Reported on 01/02/2024)       ROS: Constitutional: No fever or chills Vision: No changes in vision ENT: No difficulty swallowing CV: No chest pain Pulm: No SOB or wheezing GI: No nausea or vomiting GU: No urgency or inability to hold urine Skin: No poor wound healing Neurologic: No numbness or tingling Psychiatric: No depression or anxiety Heme: No bruising Allergic: No reaction to medications or food   Exam: Blood pressure (!) 143/69, pulse 76, temperature 98.2 F (36.8 C), temperature source Oral, resp. rate 18, height 5' 11 (1.803 m), weight 94.3 kg, SpO2 94%. General: No acute distress Orientation: Awake alert and oriented x 3 Mood and Affect: Cooperative and pleasant Gait: Unable to assess due to his fracture Coordination and balance: Within normal limits  Left lower extremity: Leg is shortened and externally rotated.  Previous surgical incisions are present.  They are healed well.  Thigh compartments are soft and compressible.  He has active dorsiflexion plantarflexion foot and ankle he is warm well-perfused foot with brisk cap refill.  Right lower extremity: Skin without lesions. No tenderness to palpation. Full painless ROM, full strength in each muscle groups without evidence of instability.   Medical Decision Making: Data: Imaging: X-rays have been reviewed which shows a periprosthetic proximal femur fracture between the previous distal interlock in his intertrochanteric femur fracture.  Labs:  Results for orders placed or performed during the hospital encounter of 01/01/24 (from the past 24 hours)   Basic metabolic panel     Status: Abnormal   Collection Time:  01/01/24  9:47 PM  Result Value Ref Range   Sodium 135 135 - 145 mmol/L   Potassium 3.5 3.5 - 5.1 mmol/L   Chloride 100 98 - 111 mmol/L   CO2 24 22 - 32 mmol/L   Glucose, Bld 242 (H) 70 - 99 mg/dL   BUN 26 (H) 8 - 23 mg/dL   Creatinine, Ser 8.59 (H) 0.61 - 1.24 mg/dL   Calcium  9.5 8.9 - 10.3 mg/dL   GFR, Estimated 49 (L) >60 mL/min   Anion gap 11 5 - 15  CBC with Differential     Status: None   Collection Time: 01/01/24  9:47 PM  Result Value Ref Range   WBC 9.0 4.0 - 10.5 K/uL   RBC 4.42 4.22 - 5.81 MIL/uL   Hemoglobin 13.7 13.0 - 17.0 g/dL   HCT 58.9 60.9 - 47.9 %   MCV 92.8 80.0 - 100.0 fL   MCH 31.0 26.0 - 34.0 pg   MCHC 33.4 30.0 - 36.0 g/dL   RDW 85.9 88.4 - 84.4 %   Platelets 333 150 - 400 K/uL   nRBC 0.0 0.0 - 0.2 %   Neutrophils Relative % 61 %   Neutro Abs 5.6 1.7 - 7.7 K/uL   Lymphocytes Relative 29 %   Lymphs Abs 2.6 0.7 - 4.0 K/uL   Monocytes Relative 6 %   Monocytes Absolute 0.6 0.1 - 1.0 K/uL   Eosinophils Relative 2 %   Eosinophils Absolute 0.2 0.0 - 0.5 K/uL   Basophils Relative 1 %   Basophils Absolute 0.1 0.0 - 0.1 K/uL   Immature Granulocytes 1 %   Abs Immature Granulocytes 0.05 0.00 - 0.07 K/uL  Protime-INR     Status: None   Collection Time: 01/01/24  9:47 PM  Result Value Ref Range   Prothrombin Time 13.2 11.4 - 15.2 seconds   INR 0.9 0.8 - 1.2  Type and screen [facility]     Status: None   Collection Time: 01/01/24  9:47 PM  Result Value Ref Range   ABO/RH(D) O POS    Antibody Screen NEG    Sample Expiration      01/04/2024,2359 Performed at [facility], [address], [place], [place] [address]   Glucose, capillary     Status: Abnormal   Collection Time: 01/02/24  2:38 AM  Result Value Ref Range   Glucose-Capillary 239 (H) 70 - 99 mg/dL  Surgical pcr screen     Status: None   Collection Time: 01/02/24  2:59 AM   Specimen: Nasal Mucosa; Nasal Swab   Result Value Ref Range   MRSA, PCR NEGATIVE NEGATIVE   Staphylococcus aureus NEGATIVE NEGATIVE  Glucose, capillary     Status: Abnormal   Collection Time: 01/02/24  7:28 AM  Result Value Ref Range   Glucose-Capillary 230 (H) 70 - 99 mg/dL   Comment 1 Notify RN      Imaging or Labs ordered: None  Medical history and chart was reviewed and case discussed with medical provider.  Assessment/Plan: 89 year old male with periprosthetic proximal femur fracture.  Due to the location and unstable nature of his injury he will need revision fixation of his femur.  Will likely remove his previous nail and perform renailing with a longer nail.  Risks and benefits were discussed with the patient.  Risks include but not limited to bleeding, infection, malunion, nonunion, periprosthetic fracture, hardware failure, hardware loosening, nerve or blood vessel injury, DVT, even the possibility  anesthetic complications.  He agreed to proceed with surgery and consent was obtained.  Surgery w/ risks or Emergency surgery: High complexity Risk (Level 5)  [name] MYRTIS Light, MD [facility] [phone] (office) [email or web address]

## 2024-01-03 DIAGNOSIS — S72002A Fracture of unspecified part of neck of left femur, initial encounter for closed fracture: Secondary | ICD-10-CM | POA: Diagnosis not present

## 2024-01-03 LAB — BASIC METABOLIC PANEL WITH GFR
Anion gap: 11 (ref 5–15)
BUN: 25 mg/dL — ABNORMAL HIGH (ref 8–23)
CO2: 26 mmol/L (ref 22–32)
Calcium: 9.2 mg/dL (ref 8.9–10.3)
Chloride: 101 mmol/L (ref 98–111)
Creatinine, Ser: 1.45 mg/dL — ABNORMAL HIGH (ref 0.61–1.24)
GFR, Estimated: 47 mL/min — ABNORMAL LOW (ref >60)
Glucose, Bld: 192 mg/dL — ABNORMAL HIGH (ref 70–99)
Potassium: 3.3 mmol/L — ABNORMAL LOW (ref 3.5–5.1)
Sodium: 138 mmol/L (ref 135–145)

## 2024-01-03 LAB — CBC
HCT: 33.6 % — ABNORMAL LOW (ref 39.0–52.0)
Hemoglobin: 11.1 g/dL — ABNORMAL LOW (ref 13.0–17.0)
MCH: 31 pg (ref 26.0–34.0)
MCHC: 33 g/dL (ref 30.0–36.0)
MCV: 93.9 fL (ref 80.0–100.0)
Platelets: 276 K/uL (ref 150–400)
RBC: 3.58 MIL/uL — ABNORMAL LOW (ref 4.22–5.81)
RDW: 14.4 % (ref 11.5–15.5)
WBC: 11 K/uL — ABNORMAL HIGH (ref 4.0–10.5)
nRBC: 0 % (ref 0.0–0.2)

## 2024-01-03 LAB — GLUCOSE, CAPILLARY
Glucose-Capillary: 198 mg/dL — ABNORMAL HIGH (ref 70–99)
Glucose-Capillary: 224 mg/dL — ABNORMAL HIGH (ref 70–99)
Glucose-Capillary: 275 mg/dL — ABNORMAL HIGH (ref 70–99)
Glucose-Capillary: 240 mg/dL — ABNORMAL HIGH (ref 70–99)

## 2024-01-03 MED ORDER — ORAL CARE MOUTH RINSE: 15.0000 mL | OROMUCOSAL | Status: DC | PRN

## 2024-01-03 MED ORDER — POTASSIUM CHLORIDE 20 MEQ PO PACK
40.0000 meq | PACK | Freq: Once | ORAL | Status: AC
  Administered 2024-01-03: 40 meq via ORAL
  Filled 2024-01-03: qty 2

## 2024-01-03 NOTE — Plan of Care (Signed)
   Problem: Activity: Goal: Risk for activity intolerance will decrease Outcome: Progressing   Problem: Nutrition: Goal: Adequate nutrition will be maintained Outcome: Progressing   Problem: Elimination: Goal: Will not experience complications related to bowel motility Outcome: Progressing   Problem: Safety: Goal: Ability to remain free from injury will improve Outcome: Progressing

## 2024-01-03 NOTE — Evaluation (Signed)
 Physical Therapy Evaluation Patient Details Name: [name] MRN: [id number] DOB: 1934/04/20 Today's Date: 01/03/2024  History of Present Illness  Admitted after a ground level fall resulting in a periprosthetic fracture of L hip (fall in early Oct with L intertrochanteric fx); s/p surgical fixation of L hip periprosthetic fx on 11/22, WBAT; Noteworthy that pt went to SNF for rehab after previous L hip fx, had a good rehab experience, and was able to dc home; male with medical history significant of  anxiety, coronary artery disease, hyperlipidemia, type 2 diabetes on insulin , nephrolithiasis, BPH  Clinical Impression   Pt admitted with above diagnosis. Lives at home with son, in a single-level home with 3 steps to enter; Prior to admission, pt was able to manage walking short distances with RW; Noteworthy that before his fall in October, he was independent with RW; Presents to PT with a functional decline compared to his baseline, LLE pain and weakness effecting safety with mobility, incr fall risk, decr activity tolerance; Needed 2 person up to Mod assist today to move supine to sit, to stand from EOB, and to take pivot steps bed to recliner; He participated well despite LLE pain and seems to have had a good rehab experience at [facility] after previous fall and L hip fx; Patient will benefit from continued inpatient follow up therapy, <3 hours/day to maximize independence and safety with mobility and ADLs;  Pt currently with functional limitations due to the deficits listed below (see PT Problem List). Pt will benefit from skilled PT to increase their independence and safety with mobility to allow discharge to the venue listed below.           If plan is discharge home, recommend the following: Two people to help with walking and/or transfers;A lot of help with bathing/dressing/bathroom   Can travel by private vehicle   No    Equipment Recommendations None recommended by PT   Recommendations for Other Services       Functional Status Assessment Patient has had a recent decline in their functional status and demonstrates the ability to make significant improvements in function in a reasonable and predictable amount of time.     Precautions / Restrictions Precautions Precautions: Fall Recall of Precautions/Restrictions: Intact Restrictions Weight Bearing Restrictions Per Provider Order: No LLE Weight Bearing Per Provider Order: Weight bearing as tolerated      Mobility  Bed Mobility Overal bed mobility: Needs Assistance Bed Mobility: Supine to Sit     Supine to sit: Mod assist     General bed mobility comments: Cues and min assist for moving LEs towards EOB; Good use of bedrail to push up to sitting; Mod assist to comletely elevate trunk to sitting and to use bed pads to square off hips at EOB    Transfers Overall transfer level: Needs assistance Equipment used: Rolling walker (2 wheels) Transfers: Sit to/from Stand, Bed to chair/wheelchair/BSC Sit to Stand: Mod assist, +2 safety/equipment (from elelvaed bed)   Step pivot transfers: Mod assist, +2 safety/equipment, +2 physical assistance       General transfer comment: Able to power up to sand well; light mod assist to steady; noting dcer weight acceptance on LLE during transition to stand; pivot steps bed to recliner on pt's R with mod assist for bilateral support; decr standing tolerance, and pulled chair closer to pt to sit    Ambulation/Gait                  Stairs  Wheelchair Mobility     Tilt Bed    Modified Rankin (Stroke Patients Only)       Balance Overall balance assessment: Needs assistance   Sitting balance-Leahy Scale: Poor Sitting balance - Comments: Min assist to keep balance at initial sit     Standing balance-Leahy Scale: Poor                               Pertinent Vitals/Pain Pain Assessment Pain Assessment: 0-10 Pain  Score: 5  Pain Location: L hip Pain Descriptors / Indicators: Aching, Grimacing, Guarding Pain Intervention(s): Monitored during session, Premedicated before session, Repositioned    Home Living Family/patient expects to be discharged to:: Private residence Living Arrangements: Children Available Help at Discharge: Family;Available 24 hours/day Type of Home: House Home Access: Stairs to enter Entrance Stairs-Rails: Right Entrance Stairs-Number of Steps: 3   Home Layout: One level Home Equipment: Agricultural Consultant (2 wheels);Cane - single point;BSC/3in1;Shower seat;Grab bars - toilet;Grab bars - tub/shower;Hand held shower head;Adaptive equipment;Lift chair Additional Comments: Notable that pt was Modified Independent, driving prior to his first fall in early October    Prior Function               Mobility Comments: used RW mod I ADLs Comments: some assist with IADLs     Extremity/Trunk Assessment   Upper Extremity Assessment Upper Extremity Assessment: Defer to OT evaluation    Lower Extremity Assessment Lower Extremity Assessment: Generalized weakness;RLE deficits/detail RLE Deficits / Details: Grossly decr AROM and strength, limited by pain post fracture and surgical fixation; Good ankle pumps and abel to fire quadriceps for very short arc quad in bed       Communication   Communication Communication: Impaired Factors Affecting Communication: Hearing impaired;Reduced clarity of speech    Cognition Arousal: Alert Behavior During Therapy: WFL for tasks assessed/performed   PT - Cognitive impairments: Attention                       PT - Cognition Comments: Appeared internally distracted by pain Following commands: Intact       Cueing Cueing Techniques: Verbal cues, Tactile cues     General Comments General comments (skin integrity, edema, etc.): Session conducted on room air and O2 sats ranged from 89 to 96%; Taught incentive spirometry, and pt  performed 10 inhalations with cues for slow, strong, deep breaths; re-started supplemental O2 end of session    Exercises     Assessment/Plan    PT Assessment Patient needs continued PT services  PT Problem List Decreased strength;Decreased range of motion;Decreased activity tolerance;Decreased balance;Decreased mobility;Decreased coordination;Decreased knowledge of use of DME;Decreased safety awareness;Decreased knowledge of precautions;Cardiopulmonary status limiting activity;Pain       PT Treatment Interventions DME instruction;Gait training;Stair training;Functional mobility training;Therapeutic activities;Therapeutic exercise;Balance training;Neuromuscular re-education;Cognitive remediation;Patient/family education;Wheelchair mobility training;Manual techniques    PT Goals (Current goals can be found in the Care Plan section)  Acute Rehab PT Goals Patient Stated Goal: Did not specifically state; Agrees to getting OOB PT Goal Formulation: Patient unable to participate in goal setting Time For Goal Achievement: 01/17/24 Potential to Achieve Goals: Good    Frequency Min 2X/week     Co-evaluation               AM-PAC PT 6 Clicks Mobility  Outcome Measure Help needed turning from your back to your side while in a flat bed without using bedrails?: A Little  Help needed moving from lying on your back to sitting on the side of a flat bed without using bedrails?: A Lot Help needed moving to and from a bed to a chair (including a wheelchair)?: Total Help needed standing up from a chair using your arms (e.g., wheelchair or bedside chair)?: A Lot Help needed to walk in hospital room?: Total Help needed climbing 3-5 steps with a railing? : Total 6 Click Score: 10    End of Session Equipment Utilized During Treatment: Gait belt Activity Tolerance: Patient tolerated treatment well Patient left: in chair;with call bell/phone within reach;with chair alarm set Nurse Communication:  Mobility status PT Visit Diagnosis: Unsteadiness on feet (R26.81);History of falling (Z91.81);Pain Pain - Right/Left: Left Pain - part of body: Hip    Time: 9089-9064 PT Time Calculation (min) (ACUTE ONLY): 25 min   Charges:   PT Evaluation $PT Eval Moderate Complexity: 1 Mod PT Treatments $Therapeutic Activity: 8-22 mins PT General Charges $$ ACUTE PT VISIT: 1 Visit         Silvano Currier, PT  Acute Rehabilitation Services Office [phone] Secure Chat welcomed   Silvano VEAR Currier 01/03/2024, 10:26 AM

## 2024-01-03 NOTE — Plan of Care (Signed)
  Problem: Coping: Goal: Ability to adjust to condition or change in health will improve Outcome: Progressing   Problem: Pain Managment: Goal: General experience of comfort will improve and/or be controlled Outcome: Progressing   Problem: Safety: Goal: Ability to remain free from injury will improve Outcome: Progressing

## 2024-01-03 NOTE — Evaluation (Signed)
 Occupational Therapy Evaluation Patient Details Name: [name] MRN: [id number] DOB: [birth year] Today's Date: 01/03/2024   History of Present Illness   89 y/o M presenting to ED on 11/21 after mechanical fall, found to have L periprosthetic femoral shaft fx around previous IMN, s/p IMN on 11/22.     PMH includes anxiety, CAD, HLD, DM2 on insulin , nephrolithaisis, BPH     Clinical Impressions Pt reports using RW at baseline for mobility and  has assist from son for ADLs, pt lives with son who can assist PRN. Pt currently needs up to max A for ADLs, mod A for bed mobility and mod A for step pivot transfer to chair on R with RW. Pt needs cues to keep hands on RW as pt frequently trying to reach for arm of chair to sit prematurely. Pt with VSS on 0.5L O2 during session, RN aware. Pt presenting with impairments listed below, will follow acutely. Patient will benefit from continued inpatient follow up therapy, <3 hours/day.       If plan is discharge home, recommend the following:   A lot of help with walking and/or transfers;A lot of help with bathing/dressing/bathroom;Assistance with cooking/housework;Direct supervision/assist for medications management;Direct supervision/assist for financial management;Assist for transportation;Help with stairs or ramp for entrance     Functional Status Assessment   Patient has had a recent decline in their functional status and demonstrates the ability to make significant improvements in function in a reasonable and predictable amount of time.     Equipment Recommendations   Other (comment) (defer)     Recommendations for Other Services   PT consult     Precautions/Restrictions   Precautions Precautions: Fall Recall of Precautions/Restrictions: Intact Restrictions LLE Weight Bearing Per Provider Order: Weight bearing as tolerated     Mobility Bed Mobility Overal bed mobility: Needs Assistance Bed Mobility: Supine to Sit      Supine to sit: Mod assist          Transfers Overall transfer level: Needs assistance Equipment used: Rolling walker (2 wheels) Transfers: Sit to/from Stand, Bed to chair/wheelchair/BSC Sit to Stand: Mod assist     Step pivot transfers: Mod assist     General transfer comment: cues to keep hands on RW, frequently letting go to reach for arms of chair      Balance Overall balance assessment: Needs assistance   Sitting balance-Leahy Scale: Fair Sitting balance - Comments: sits unsupported but with R lateral lean to offload LLE     Standing balance-Leahy Scale: Poor Standing balance comment: heavy reliance on external support                           ADL either performed or assessed with clinical judgement   ADL Overall ADL's : Needs assistance/impaired Eating/Feeding: Set up;Sitting   Grooming: Set up;Sitting   Upper Body Bathing: Moderate assistance   Lower Body Bathing: Maximal assistance   Upper Body Dressing : Moderate assistance   Lower Body Dressing: Maximal assistance   Toilet Transfer: Moderate assistance;Rolling walker (2 wheels);BSC/3in1;Stand-pivot   Toileting- Clothing Manipulation and Hygiene: Maximal assistance       Functional mobility during ADLs: Moderate assistance;Rolling walker (2 wheels)       Vision   Vision Assessment?: No apparent visual deficits     Perception Perception: Not tested       Praxis Praxis: Not tested       Pertinent Vitals/Pain Pain Assessment Pain Assessment: Faces  Pain Score: 5  Faces Pain Scale: Hurts even more Pain Location: L hip Pain Descriptors / Indicators: Aching, Grimacing, Guarding Pain Intervention(s): Limited activity within patient's tolerance, Monitored during session, Repositioned     Extremity/Trunk Assessment Upper Extremity Assessment Upper Extremity Assessment: Generalized weakness   Lower Extremity Assessment Lower Extremity Assessment: Defer to PT evaluation    Cervical / Trunk Assessment Cervical / Trunk Assessment: Normal   Communication Communication Communication: Impaired Factors Affecting Communication: Hearing impaired;Reduced clarity of speech   Cognition Arousal: Alert Behavior During Therapy: WFL for tasks assessed/performed Cognition: No apparent impairments                               Following commands: Intact       Cueing  General Comments   Cueing Techniques: Verbal cues;Tactile cues  SpO2 95% on 0.5L O2   Exercises     Shoulder Instructions      Home Living Family/patient expects to be discharged to:: Private residence Living Arrangements: Children (son) Available Help at Discharge: Family;Available PRN/intermittently Type of Home: House Home Access: Ramped entrance   Entrance Stairs-Rails: Right Home Layout: One level     Bathroom Shower/Tub: Tub/shower unit;Curtain   Bathroom Toilet: Handicapped height     Home Equipment: Agricultural Consultant (2 wheels);Cane - single point;BSC/3in1;Shower seat;Grab bars - toilet;Grab bars - tub/shower;Hand held shower head;Adaptive equipment;Lift chair   Additional Comments: adjustable bed      Prior Functioning/Environment Prior Level of Function : Needs assist             Mobility Comments: used RW mod I ADLs Comments: has assist for ADLs, able to self feed and perform grooming tasks    OT Problem List: Decreased strength;Decreased range of motion;Decreased activity tolerance;Impaired balance (sitting and/or standing);Decreased safety awareness   OT Treatment/Interventions: Self-care/ADL training;Therapeutic exercise;Energy conservation;DME and/or AE instruction;Therapeutic activities;Visual/perceptual remediation/compensation;Patient/family education;Balance training      OT Goals(Current goals can be found in the care plan section)   Acute Rehab OT Goals Patient Stated Goal: did not state OT Goal Formulation: With patient Time For Goal  Achievement: 01/17/24 Potential to Achieve Goals: Good ADL Goals Pt Will Perform Upper Body Dressing: with contact guard assist;sitting Pt Will Perform Lower Body Dressing: with adaptive equipment;sitting/lateral leans;with min assist Pt Will Transfer to Toilet: ambulating;regular height toilet;with min assist Additional ADL Goal #1: pt will demo good sitting balance in prep for seated ADLs   OT Frequency:  Min 2X/week    Co-evaluation              AM-PAC OT 6 Clicks Daily Activity     Outcome Measure Help from another person eating meals?: A Little Help from another person taking care of personal grooming?: A Little Help from another person toileting, which includes using toliet, bedpan, or urinal?: A Lot Help from another person bathing (including washing, rinsing, drying)?: A Lot Help from another person to put on and taking off regular upper body clothing?: A Lot Help from another person to put on and taking off regular lower body clothing?: A Lot 6 Click Score: 14   End of Session Equipment Utilized During Treatment: Rolling walker (2 wheels);Gait belt Nurse Communication: Mobility status  Activity Tolerance: Patient tolerated treatment well Patient left: in chair;with call bell/phone within reach;with chair alarm set;with family/visitor present  OT Visit Diagnosis: Unsteadiness on feet (R26.81);Other abnormalities of gait and mobility (R26.89);Muscle weakness (generalized) (M62.81);History of falling (Z91.81)  Time: 8576-8558 OT Time Calculation (min): 18 min Charges:  OT General Charges $OT Visit: 1 Visit OT Evaluation $OT Eval Moderate Complexity: 1 Mod  Shogo Larkey K, OTD, OTR/L SecureChat Preferred Acute Rehab [phone]   Laneta POUR Koonce 01/03/2024, 3:56 PM

## 2024-01-03 NOTE — Progress Notes (Signed)
 Orthopaedic Trauma Progress Note  SUBJECTIVE: Resting comfortably in bed this morning.  Notes that pain in the left leg is fairly well-controlled.  Denies any numbness or tingling throughout the left lower extremity.  No acute events noted overnight.  Has not been out of bed yet since surgery.  No chest pain. No SOB. No nausea/vomiting. No other complaints.  No family at bedside currently  OBJECTIVE:  Vitals:   01/03/24 0029 01/03/24 0623  BP: (!) 140/63 (!) 154/67  Pulse: 72 77  Resp: 19 18  Temp: 98.3 F (36.8 C) 98.5 F (36.9 C)  SpO2: 92% 93%    Opiates Today (MME): Today's  total administered Morphine  Milligram Equivalents: 0 Opiates Yesterday (MME): Yesterday's total administered Morphine  Milligram Equivalents: 120  General: Resting in bed, no acute distress Respiratory: No increased work of breathing.  Operative Extremity (LLE): There is a small spot of drainage on proximal dressing but otherwise all other dressings are clean, dry, intact.  Some soreness over the hip as expected.  Otherwise no significant tenderness throughout the thigh or lower leg.  No calf tenderness.  Tolerates gentle ankle DF/PF.  Foot warm and well-perfused.  2+ DP pulse  IMAGING: Stable post op imaging left femur  LABS:  Results for orders placed or performed during the hospital encounter of 01/01/24 (from the past 24 hours)  Glucose, capillary     Status: Abnormal   Collection Time: 01/02/24  7:28 AM  Result Value Ref Range   Glucose-Capillary 230 (H) 70 - 99 mg/dL   Comment 1 Notify RN   Glucose, capillary     Status: Abnormal   Collection Time: 01/02/24 11:02 AM  Result Value Ref Range   Glucose-Capillary 182 (H) 70 - 99 mg/dL  VITAMIN D  25 Hydroxy (Vit-D Deficiency, Fractures)     Status: None   Collection Time: 01/02/24  1:28 PM  Result Value Ref Range   Vit D, 25-Hydroxy 32.49 30 - 100 ng/mL  Glucose, capillary     Status: Abnormal   Collection Time: 01/02/24  4:38 PM  Result Value Ref  Range   Glucose-Capillary 177 (H) 70 - 99 mg/dL   Comment 1 Notify RN   Glucose, capillary     Status: Abnormal   Collection Time: 01/02/24  8:02 PM  Result Value Ref Range   Glucose-Capillary 221 (H) 70 - 99 mg/dL  CBC     Status: Abnormal   Collection Time: 01/03/24  6:12 AM  Result Value Ref Range   WBC 11.0 (H) 4.0 - 10.5 K/uL   RBC 3.58 (L) 4.22 - 5.81 MIL/uL   Hemoglobin 11.1 (L) 13.0 - 17.0 g/dL   HCT 66.3 (L) 60.9 - 47.9 %   MCV 93.9 80.0 - 100.0 fL   MCH 31.0 26.0 - 34.0 pg   MCHC 33.0 30.0 - 36.0 g/dL   RDW 85.5 88.4 - 84.4 %   Platelets 276 150 - 400 K/uL   nRBC 0.0 0.0 - 0.2 %    ASSESSMENT: [name] is a 89 y.o. male, 1 Day Post-Op s/p ground-level fall Procedures:  INTRAMEDULLARY NAILING OF LEFT PERIPROSTHETIC FEMORAL SHAFT FRACTURE REVISION FIXATION OF LEFT INTERTROCHANTERIC FEMUR FRACTURE REMOVAL OF HARDWARE LEFT PROXIMAL FEMUR  CV/Blood loss: Acute blood loss anemia, Hgb 11.1 this AM. Hemodynamically stable  PLAN: Weightbearing: WBAT LLE ROM: Unrestricted ROM Incisional and dressing care: Reinforce dressings as needed  Showering: Okay to be getting incisions wet starting 01/05/2024 Orthopedic device(s): None  Pain management:  1. Tylenol   650 mg q 6 hours  2. Robaxin  500 mg q 8 hours PRN 3. Tramadol  50-100 mg q 6 hours PRN 4. Dilaudid  0.5-1 mg q 3 hours PRN VTE prophylaxis: Lovenox , SCDs ID:  Ancef  2gm post op Foley/Lines:  No foley, KVO IVFs Impediments to Fracture Healing: Vitamin D  level looks okay at 32, no additional supplementation needed Dispo: PT/OT evaluation today, patient may require SNF at discharge.  Ortho issues stable.  Plan to remove dressings LLE tomorrow 01/04/2024.  Okay for discharge from ortho standpoint once cleared by medicine team and therapies  D/C recommendations: - Tramadol , Robaxin , Tylenol  for pain control - Eliquis  2.5 mg twice daily x 30 days for DVT prophylaxis - No additional need for Vit D  supplementation  Follow - up plan: 2 weeks after d/c for wound check and repeat x-rays   Contact information:  Franky Light MD, Lauraine Moores PA-C. After hours and holidays please check [email or web address] for group call information for [facility]   Lauraine PATRIC Moores, PA-C [phone] (office) [email or web address]

## 2024-01-03 NOTE — Progress Notes (Signed)
 PROGRESS NOTE    [name]  FMW:[id number] DOB: [birth year] DOA: 01/01/2024 PCP: [name], MD    Brief Narrative:  This 89 yrs old Male with PMH significant for anxiety, coronary artery disease, hyperlipidemia, type 2 diabetes on insulin , Nephrolithiasis, BPH presented in the ED status post mechanical fall at home.  According to the son patient went for an MRI earlier in the day because he was noted to have a mass on the kidney and they wanted further imaging. He returned home and ate lunch and then decided to head to the living room and then fell and hit the left hip.  He was unable to stand.  X-ray hip shows acute periprosthetic fracture into the proximal left femoral shaft with mild displacement.  Patient was admitted for further evaluation.  Orthopedics is consulted.  Patient underwent ORIF.  POD #1  Assessment & Plan:   Principal Problem:   Closed left hip fracture, initial encounter (HCC)  Closed left hip fracture: Patient is status post mechanical fall with left hip pain. X-ray shows acute periprosthetic fracture into the proximal left femoral shaft with mild displacement. Continue adequate pain control. Orthopedics is consulted,  Patient underwent ORIF POD # 1. PT and OT evaluation. Weightbearing as tolerated left lower extremity.  Chronic Combined systolic and diastolic heart failure: Does not appear in acute exacerbation. Continue Lasix  40 mg daily. Continue losartan  25 mg daily. Coreg  12.5 mg twice daily. Daily weight , intake/ output charting.  Essential hypertension: Continue Norvasc , Lasix , Coreg , losartan .  Hyperlipidemia Will resume statin.  Type 2 diabetes: Hold p.o. diabetic medications,  Continue low-dose sliding scale. Continue Lantus  16 unit daily.  DVT prophylaxis: Lovenox  Code Status: Full code Family Communication: No family at bedside Disposition Plan:    Status is: Inpatient Remains inpatient appropriate because: Admitted status  post mechanical fall with left femoral shaft fracture.   Status post ORIF POD #1.  PT and OT recommended SNF.   Consultants:  Orthopedics  Procedures: S/P ORIF POD # 1   Antimicrobials: Anti-infectives (From admission, onward)    Start     Dose/Rate Route Frequency Ordered Stop   01/02/24 1400  ceFAZolin  (ANCEF ) IVPB 2g/100 mL premix        2 g 200 mL/hr over 30 Minutes Intravenous Every 8 hours 01/02/24 1221 01/03/24 0648   01/02/24 0948  vancomycin  (VANCOCIN ) powder  Status:  Discontinued          As needed 01/02/24 0948 01/02/24 1057   01/02/24 0845  ceFAZolin  (ANCEF ) IVPB 2g/100 mL premix        2 g 200 mL/hr over 30 Minutes Intravenous  Once 01/02/24 0844 01/02/24 0940      Subjective: Patient was seen and examined at bedside.  Overnight events noted. Patient was sitting comfortably on the recliner,  status post ORIF POD #1. Patient reports pain is reasonably controlled.  Objective: Vitals:   01/03/24 0029 01/03/24 0623 01/03/24 0836 01/03/24 1020  BP: (!) 140/63 (!) 154/67 (!) 181/72 (!) (P) 124/58  Pulse: 72 77 74 (P) 70  Resp: 19 18 18  (P) 18  Temp: 98.3 F (36.8 C) 98.5 F (36.9 C) 98.1 F (36.7 C) (P) 98.2 F (36.8 C)  TempSrc: Oral Oral Oral (P) Oral  SpO2: 92% 93% 96% (P) 93%  Weight:      Height:        Intake/Output Summary (Last 24 hours) at 01/03/2024 1136 Last data filed at 01/03/2024 9371 Gross per 24 hour  Intake 259.83 ml  Output 1250 ml  Net -990.17 ml   Filed Weights   01/02/24 0115  Weight: 94.3 kg    Examination:  General exam: Appears calm and comfortable, not in any acute distress. Respiratory system: Clear to auscultation. Respiratory effort normal. RR 12 Cardiovascular system: S1 & S2 heard, RRR. No JVD, murmurs, rubs, gallops or clicks.  Gastrointestinal system: Abdomen is non distended, soft and non tender.  Normal bowel sounds heard. Central nervous system: Alert and oriented x 3. No focal neurological  deficits. Extremities:  Left Hip pain, Status post ORIF.  POD #1. Skin: No rashes, lesions or ulcers Psychiatry: Judgement and insight appear normal. Mood & affect appropriate.     Data Reviewed: I have personally reviewed following labs and imaging studies  CBC: Recent Labs  Lab 01/01/24 2147 01/03/24 0612  WBC 9.0 11.0*  NEUTROABS 5.6  --   HGB 13.7 11.1*  HCT 41.0 33.6*  MCV 92.8 93.9  PLT 333 276   Basic Metabolic Panel: Recent Labs  Lab 01/01/24 2147 01/03/24 0612  NA 135 138  K 3.5 3.3*  CL 100 101  CO2 24 26  GLUCOSE 242* 192*  BUN 26* 25*  CREATININE 1.40* 1.45*  CALCIUM  9.5 9.2   GFR: Estimated Creatinine Clearance: 42.1 mL/min (A) (by C-G formula based on SCr of 1.45 mg/dL (H)). Liver Function Tests: No results for input(s): AST, ALT, ALKPHOS, BILITOT, PROT, ALBUMIN  in the last 168 hours. No results for input(s): LIPASE, AMYLASE in the last 168 hours. No results for input(s): AMMONIA in the last 168 hours. Coagulation Profile: Recent Labs  Lab 01/01/24 2147  INR 0.9   Cardiac Enzymes: No results for input(s): CKTOTAL, CKMB, CKMBINDEX, TROPONINI in the last 168 hours. BNP (last 3 results) No results for input(s): PROBNP in the last 8760 hours. HbA1C: No results for input(s): HGBA1C in the last 72 hours. CBG: Recent Labs  Lab 01/02/24 0728 01/02/24 1102 01/02/24 1638 01/02/24 2002 01/03/24 0813  GLUCAP 230* 182* 177* 221* 198*   Lipid Profile: No results for input(s): CHOL, HDL, LDLCALC, TRIG, CHOLHDL, LDLDIRECT in the last 72 hours. Thyroid  Function Tests: No results for input(s): TSH, T4TOTAL, FREET4, T3FREE, THYROIDAB in the last 72 hours. Anemia Panel: No results for input(s): VITAMINB12, FOLATE, FERRITIN, TIBC, IRON, RETICCTPCT in the last 72 hours. Sepsis Labs: No results for input(s): PROCALCITON, LATICACIDVEN in the last 168 hours.  Recent Results (from the past  240 hours)  Surgical pcr screen     Status: None   Collection Time: 01/02/24  2:59 AM   Specimen: Nasal Mucosa; Nasal Swab  Result Value Ref Range Status   MRSA, PCR NEGATIVE NEGATIVE Final   Staphylococcus aureus NEGATIVE NEGATIVE Final    Comment: (NOTE) The Xpert SA Assay (FDA approved for NASAL specimens in patients 32 years of age and older), is one component of a comprehensive surveillance program. It is not intended to diagnose infection nor to guide or monitor treatment. Performed at [facility], [address], [place], [place] [address]     Radiology Studies: DG FEMUR PORT MIN 2 VIEWS LEFT Result Date: 01/02/2024 CLINICAL DATA:  [id number] Fracture [id number] EXAM: LEFT FEMUR PORTABLE 2 VIEWS COMPARISON:  November twenty-first 2025 FINDINGS: Status post interval removal of a short stem intramedullary rod with replacement with a long stem intramedullary rod within the LEFT femur. Fracture fragments are in mildly improved alignment with mild persistent apex posterior angulation and comminution. Comminuted appearance of the lesser  trochanter. Adjacent soft tissue air. Osteopenia. Vascular calcifications. Degenerative changes of the knee. IMPRESSION: Expected immediate postsurgical appearance status post long-stem placement of a femoral intramedullary rod. Electronically Signed   By: Corean Salter M.D.   On: 01/02/2024 12:03   DG FEMUR MIN 2 VIEWS LEFT Result Date: 01/02/2024 CLINICAL DATA:  [id number] Surgery, elective [id number] EXAM: LEFT FEMUR 2 VIEWS COMPARISON:  January 01, 2024 FINDINGS: Spot fluoroscopy images were obtained for surgical planning purposes. This demonstrates revisualization of a fracture inferior to prior LEFT intramedullary femoral rod. Fluoroscopic images demonstrate subsequent removal of prior LEFT femoral intramedullary rod with replacement with a longer intramedullary rod to span site of fracture. Vascular calcifications. Time: 3 minute 16 seconds Dose: 23.46 mGy  Please reference procedure report for further details. IMPRESSION: Spot fluoroscopy images obtained for surgical planning purposes. Electronically Signed   By: Corean Salter M.D.   On: 01/02/2024 12:01   DG C-Arm 1-60 Min-No Report Result Date: 01/02/2024 Fluoroscopy was utilized by the requesting physician.  No radiographic interpretation.   DG C-Arm 1-60 Min-No Report Result Date: 01/02/2024 Fluoroscopy was utilized by the requesting physician.  No radiographic interpretation.   DG Hip Unilat With Pelvis 2-3 Views Left Result Date: 01/01/2024 EXAM: 2 or 3 VIEW(S) XRAY OF THE LEFT HIP 01/01/2024 10:47:47 PM COMPARISON: 11/16/2023 CLINICAL HISTORY: fall, pain FINDINGS: BONES AND JOINTS: Prior internal fixation across the left femoral intertrochanteric fracture. Acute periprosthetic fracture in the proximal left femoral shaft with mild displacement. No dislocation. SOFT TISSUES: The soft tissues are unremarkable. IMPRESSION: 1. Acute periprosthetic fracture in the proximal left femoral shaft with mild displacement. Electronically signed by: Franky Crease MD 01/01/2024 10:52 PM EST RP Workstation: [id number]   CT CERVICAL SPINE WO CONTRAST Result Date: 01/01/2024 EXAM: CT CERVICAL SPINE WITHOUT CONTRAST 01/01/2024 10:12:00 PM TECHNIQUE: CT of the cervical spine was performed without the administration of intravenous contrast. Multiplanar reformatted images are provided for review. Automated exposure control, iterative reconstruction, and/or weight based adjustment of the mA/kV was utilized to reduce the radiation dose to as low as reasonably achievable. COMPARISON: 11/16/2023 CLINICAL HISTORY: Neck trauma (Age >= 65y) FINDINGS: CERVICAL SPINE: BONES AND ALIGNMENT: No acute fracture or traumatic malalignment. DEGENERATIVE CHANGES: Advanced bilateral degenerative facet disease diffusely. Large flowing anterior osteophytes throughout the cervical spine. SOFT TISSUES: No prevertebral soft tissue  swelling. IMPRESSION: 1. No acute abnormality of the cervical spine related to the reported neck trauma. Electronically signed by: Franky Crease MD 01/01/2024 10:34 PM EST RP Workstation: [id number]   CT HEAD WO CONTRAST Result Date: 01/01/2024 EXAM: CT HEAD WITHOUT CONTRAST 01/01/2024 10:12:00 PM TECHNIQUE: CT of the head was performed without the administration of intravenous contrast. Automated exposure control, iterative reconstruction, and/or weight based adjustment of the mA/kV was utilized to reduce the radiation dose to as low as reasonably achievable. COMPARISON: 11/16/2023 CLINICAL HISTORY: Head trauma, minor (Age >= 65y) FINDINGS: BRAIN AND VENTRICLES: There is atrophy and chronic small vessel disease throughout the deep white matter. No acute hemorrhage. No evidence of acute infarct. No hydrocephalus. No extra-axial collection. No mass effect or midline shift. ORBITS: No acute abnormality. SINUSES: No acute abnormality. SOFT TISSUES AND SKULL: No acute soft tissue abnormality. No skull fracture. IMPRESSION: 1. No acute intracranial abnormality. 2. Chronic atrophy and small vessel ischemic changes in the deep white matter. Electronically signed by: Franky Crease MD 01/01/2024 10:33 PM EST RP Workstation: [id number]   Scheduled Meds:  acetaminophen   650 mg Oral Q6H   amLODipine   5 mg Oral  q morning   aspirin  EC  81 mg Oral Daily   carvedilol   12.5 mg Oral BID WC   cyanocobalamin   1,000 mcg Oral Daily   docusate sodium   100 mg Oral BID   enoxaparin  (LOVENOX ) injection  40 mg Subcutaneous Q24H   escitalopram   20 mg Oral Daily   furosemide   40 mg Oral Daily   hydrALAZINE   50 mg Oral TID   insulin  aspart  0-9 Units Subcutaneous TID WC   insulin  glargine-yfgn  16 Units Subcutaneous Q2200   losartan   25 mg Oral Daily   senna-docusate  3 tablet Oral QHS   tamsulosin   0.4 mg Oral QPC supper   Continuous Infusions:   LOS: 1 day    Time spent: 50 mins    Darcel Dawley, MD [facility]   If 7PM-7AM, please contact night-coverage

## 2024-01-03 NOTE — Telephone Encounter (Signed)
 Please update his son.  I declined filling this prescription since he is currently inpatient.  I am going to await his inpatient notes.  I hope he feels better soon.  Thanks.

## 2024-01-04 ENCOUNTER — Encounter (HOSPITAL_COMMUNITY): Payer: Self-pay | Admitting: Student

## 2024-01-04 DIAGNOSIS — S72002A Fracture of unspecified part of neck of left femur, initial encounter for closed fracture: Secondary | ICD-10-CM | POA: Diagnosis not present

## 2024-01-04 LAB — CBC
HCT: 32.4 % — ABNORMAL LOW (ref 39.0–52.0)
Hemoglobin: 10.6 g/dL — ABNORMAL LOW (ref 13.0–17.0)
MCH: 30.7 pg (ref 26.0–34.0)
MCHC: 32.7 g/dL (ref 30.0–36.0)
MCV: 93.9 fL (ref 80.0–100.0)
Platelets: 272 K/uL (ref 150–400)
RBC: 3.45 MIL/uL — ABNORMAL LOW (ref 4.22–5.81)
RDW: 14.6 % (ref 11.5–15.5)
WBC: 9.8 K/uL (ref 4.0–10.5)
nRBC: 0 % (ref 0.0–0.2)

## 2024-01-04 LAB — GLUCOSE, CAPILLARY
Glucose-Capillary: 218 mg/dL — ABNORMAL HIGH (ref 70–99)
Glucose-Capillary: 218 mg/dL — ABNORMAL HIGH (ref 70–99)
Glucose-Capillary: 175 mg/dL — ABNORMAL HIGH (ref 70–99)
Glucose-Capillary: 255 mg/dL — ABNORMAL HIGH (ref 70–99)

## 2024-01-04 MED ORDER — TRAMADOL HCL 50 MG PO TABS: ORAL_TABLET | ORAL | 3 refills | Status: DC

## 2024-01-04 MED ORDER — APIXABAN 2.5 MG PO TABS: 2.5000 mg | ORAL_TABLET | Freq: Two times a day (BID) | ORAL | 0 refills | Status: DC

## 2024-01-04 MED ORDER — TRAMADOL HCL 50 MG PO TABS: 50.0000 mg | ORAL_TABLET | Freq: Four times a day (QID) | ORAL | 0 refills | Status: DC | PRN

## 2024-01-04 MED ORDER — ACETAMINOPHEN 325 MG PO TABS: 650.0000 mg | ORAL_TABLET | Freq: Four times a day (QID) | ORAL | Status: DC | PRN

## 2024-01-04 NOTE — [place] FL2 (Addendum)
   MEDICAID FL2 LEVEL OF CARE FORM     IDENTIFICATION  Patient Name: [name] Birthdate: [birth year] Sex: male Admission Date (Current Location): 01/01/2024  [facility] and [place] Number:  Producer, Television/film/video and Address:  [facility] [place], [address] [place], [place] [address]      Provider Number: [id number]  Attending Physician Name and Address:  Leotis Bogus, MD  Relative Name and Phone Number:       Current Level of Care: Hospital Recommended Level of Care: Skilled Nursing Facility Prior Approval Number:    Date Approved/Denied:   PASRR Number: [id number]  Discharge Plan: SNF    Current Diagnoses: Patient Active Problem List   Diagnosis Date Noted   Closed left hip fracture, initial encounter (HCC) 01/02/2024   ABLA (acute blood loss anemia) 12/20/2023   Renal mass 12/20/2023   Trochanteric fracture of femur (HCC) 12/20/2023   Closed left hip fracture (HCC) 11/16/2023   Pseudohyponatremia 11/16/2023   History of pneumonia 09/30/2023   CHF (congestive heart failure) (HCC) 06/10/2023   Acute on chronic combined systolic and diastolic heart failure (HCC) 06/08/2023   Obesity, class 1 04/11/2023   Acute respiratory failure with hypoxia (HCC) 04/09/2023   Acute on chronic systolic CHF (congestive heart failure) (HCC) 04/09/2023   Psychogenic nonepileptic seizure 02/01/2023   Obesity (BMI 30-39.9) 01/23/2023   Memory change 07/31/2022   Drug-induced myopathy 07/28/2021   Tachycardia 01/09/2021   DNR (do not resuscitate) 04/13/2020   Hematoma 04/05/2020   Epigastric abdominal pain 04/01/2020   History of melanoma 2022   Medicare annual wellness visit, subsequent 01/03/2019   Recurrent Epistaxis 03/02/2017   Vitamin B 12 deficiency 08/17/2015   Elevated TSH 10/06/2014   AKI (acute kidney injury) 10/05/2014   Unsteady gait 09/18/2014   Midline low back pain without sciatica 09/18/2014   Tremor 05/04/2014   Chronic  combined systolic and diastolic congestive heart failure (HCC) 01/27/2014   Ataxia 01/10/2014   UTI (urinary tract infection) 01/02/2014   History of coronary artery disease    Hypokalemia    Chest pain 12/15/2013   Healthcare maintenance 11/10/2013   Advance care planning 11/10/2013   ED (erectile dysfunction) 06/24/2013   HLD (hyperlipidemia) 03/22/2012   Type 2 diabetes mellitus with vascular disease (HCC) 03/22/2012   Thyroid  cyst 03/19/2011   BPH with obstruction/lower urinary tract symptoms 03/15/2010   GASTRITIS 02/21/2007   Anxiety 02/15/2007   CAD (coronary artery disease) 02/15/2007   DIVERTICULOSIS OF COLON 02/15/2007   COLONIC POLYPS 02/12/2007   Hypertension associated with diabetes (HCC) 02/12/2007   RENAL CALCULUS 02/12/2007   Osteoarthritis 02/12/2007    Orientation RESPIRATION BLADDER Height & Weight     Self, Time, Situation, Place  Normal Continent Weight: 207 lb 14.3 oz (94.3 kg) (Wt from 12/18/23) Height:  5' 11 (180.3 cm)  BEHAVIORAL SYMPTOMS/MOOD NEUROLOGICAL BOWEL NUTRITION STATUS      Continent Diet (See DC Summary)  AMBULATORY STATUS COMMUNICATION OF NEEDS Skin   Extensive Assist Verbally Other (Comment) (wound on left buttocks; closed surgical incision on left hip)                       Personal Care Assistance Level of Assistance  Bathing, Feeding, Dressing Bathing Assistance: Maximum assistance Feeding assistance: Independent Dressing Assistance: Maximum assistance     Functional Limitations Info  Sight, Speech, Hearing Sight Info: Adequate Hearing Info: Impaired Speech Info: Adequate    SPECIAL CARE FACTORS  FREQUENCY  PT (By licensed PT), OT (By licensed OT)     PT Frequency: 5x/week OT Frequency: 5x/week            Contractures Contractures Info: Not present    Additional Factors Info  Code Status, Allergies Code Status Info: Full Allergies Info: Morphine ; Atorvastatin ; Codeine Phosphate; Metformin  & Related            Current Medications (01/04/2024):  This is the current hospital active medication list Current Facility-Administered Medications  Medication Dose Route Frequency Provider Last Rate Last Admin   acetaminophen  (TYLENOL ) tablet 650 mg  650 mg Oral Q6H Danton Lauraine LABOR, PA-C   650 mg at 01/04/24 1252   amLODipine  (NORVASC ) tablet 5 mg  5 mg Oral q morning Danton Lauraine LABOR, PA-C   5 mg at 01/04/24 1044   aspirin  EC tablet 81 mg  81 mg Oral Daily Danton Lauraine LABOR, PA-C   81 mg at 01/04/24 1044   carvedilol  (COREG ) tablet 12.5 mg  12.5 mg Oral BID WC Danton Lauraine LABOR, PA-C   12.5 mg at 01/04/24 9184   cyanocobalamin  (VITAMIN B12) tablet 1,000 mcg  1,000 mcg Oral Daily Danton Lauraine LABOR, PA-C   1,000 mcg at 01/04/24 1044   diphenhydrAMINE  (BENADRYL ) 12.5 MG/5ML elixir 12.5-25 mg  12.5-25 mg Oral Q4H PRN Danton Lauraine LABOR, PA-C       docusate sodium  (COLACE) capsule 100 mg  100 mg Oral BID Danton Lauraine LABOR, PA-C   100 mg at 01/04/24 1044   enoxaparin  (LOVENOX ) injection 40 mg  40 mg Subcutaneous Q24H Danton Lauraine LABOR, PA-C   40 mg at 01/04/24 1043   escitalopram  (LEXAPRO ) tablet 20 mg  20 mg Oral Daily Danton Lauraine LABOR, PA-C   20 mg at 01/04/24 1044   furosemide  (LASIX ) tablet 40 mg  40 mg Oral Daily Danton Lauraine LABOR, PA-C   40 mg at 01/04/24 1044   hydrALAZINE  (APRESOLINE ) tablet 50 mg  50 mg Oral TID Danton Lauraine LABOR, PA-C   50 mg at 01/04/24 1043   HYDROmorphone  (DILAUDID ) injection 0.5-1 mg  0.5-1 mg Intravenous Q3H PRN Danton Lauraine LABOR, PA-C   1 mg at 01/04/24 9173   insulin  aspart (novoLOG ) injection 0-9 Units  0-9 Units Subcutaneous TID WC Danton Lauraine LABOR, PA-C   3 Units at 01/04/24 1252   insulin  glargine-yfgn (SEMGLEE ) injection 16 Units  16 Units Subcutaneous Q2200 Danton Lauraine LABOR, PA-C   16 Units at 01/03/24 2119   losartan  (COZAAR ) tablet 25 mg  25 mg Oral Daily Danton Lauraine LABOR, PA-C   25 mg at 01/04/24 1044   meclizine  (ANTIVERT ) tablet 12.5 mg  12.5 mg Oral TID PRN Danton Lauraine LABOR, PA-C        methocarbamol  (ROBAXIN ) tablet 500 mg  500 mg Oral Q8H PRN Danton Lauraine LABOR, PA-C   500 mg at 01/04/24 1044   Or   methocarbamol  (ROBAXIN ) injection 500 mg  500 mg Intravenous Q8H PRN Danton Lauraine LABOR, PA-C       metoCLOPramide  (REGLAN ) tablet 5-10 mg  5-10 mg Oral Q8H PRN Danton Lauraine LABOR, PA-C       Or   metoCLOPramide  (REGLAN ) injection 5-10 mg  5-10 mg Intravenous Q8H PRN McClung, Sarah A, PA-C       ondansetron  (ZOFRAN ) tablet 4 mg  4 mg Oral Q6H PRN Danton, Sarah A, PA-C       Or   ondansetron  (ZOFRAN ) injection 4 mg  4 mg Intravenous Q6H PRN  Danton Lauraine LABOR, PA-C       Oral care mouth rinse  15 mL Mouth Rinse PRN Leotis Bogus, MD       polyethylene glycol (MIRALAX  / GLYCOLAX ) packet 17 g  17 g Oral Daily PRN Danton, Sarah A, PA-C       senna-docusate (Senokot-S) tablet 3 tablet  3 tablet Oral QHS Danton Lauraine LABOR, PA-C   3 tablet at 01/03/24 2119   tamsulosin  (FLOMAX ) capsule 0.4 mg  0.4 mg Oral QPC supper Danton Lauraine LABOR, PA-C   0.4 mg at 01/03/24 1730   traMADol  (ULTRAM ) tablet 50-100 mg  50-100 mg Oral Q6H PRN Danton Lauraine LABOR, PA-C   50 mg at 01/04/24 1252     Discharge Medications: Please see discharge summary for a list of discharge medications.  Relevant Imaging Results:  Relevant Lab Results:   Additional Information SSN: [id number]  Jeoffrey LITTIE Moose, LCSWA

## 2024-01-04 NOTE — TOC Initial Note (Addendum)
 Transition of Care [facility]) - Initial/Assessment Note    Patient Details  Name: [name] MRN: [id number] Date of Birth: 10/31/1934  Transition of Care [facility]) CM/SW Contact:    Jeoffrey LITTIE Maranda ISRAEL Phone Number: 01/04/2024, 2:21 PM  Clinical Narrative:                 Pt admitted from home due to fall. CSW completed SNF workup with pt at bedside. Pt is agreeable to SNF at DC and stated that [name] is his first choice. CSW completed Fl2 and sent out SNF referrals. CSW will continue to follow and provide bed offers.   Expected Discharge Plan: Skilled Nursing Facility Barriers to Discharge: English As A Second Language Teacher, SNF Pending bed offer, Continued Medical Work up   Patient Goals and CMS Choice Patient states their goals for this hospitalization and ongoing recovery are:: SNF          Expected Discharge Plan and Services       Living arrangements for the past 2 months: Single Family Home                                      Prior Living Arrangements/Services Living arrangements for the past 2 months: Single Family Home Lives with:: Self Patient language and need for interpreter reviewed:: Yes Do you feel safe going back to the place where you live?: Yes      Need for Family Participation in Patient Care: No (Comment) Care giver support system in place?: Yes (comment)   Criminal Activity/Legal Involvement Pertinent to Current Situation/Hospitalization: No - Comment as needed  Activities of Daily Living   ADL Screening (condition at time of admission) Independently performs ADLs?: Yes (appropriate for developmental age) Is the patient deaf or have difficulty hearing?: Yes Does the patient have difficulty seeing, even when wearing glasses/contacts?: Yes Does the patient have difficulty concentrating, remembering, or making decisions?: No  Permission Sought/Granted Permission sought to share information with : Facility Medical Sales Representative, Family  Supports    Share Information with NAME: [name]  Permission granted to share info w AGENCY: SNFs  Permission granted to share info w Relationship: Son  Permission granted to share info w Contact Information: [phone]  Emotional Assessment Appearance:: Appears stated age Attitude/Demeanor/Rapport: Engaged Affect (typically observed): Pleasant Orientation: : Oriented to Self, Oriented to Place, Oriented to  Time, Oriented to Situation Alcohol / Substance Use: Not Applicable Psych Involvement: No (comment)  Admission diagnosis:  Closed left hip fracture, initial encounter (HCC) [S72.002A] Periprosthetic fracture of hip, initial encounter [[id number].X1269358, Z96.649] Patient Active Problem List   Diagnosis Date Noted   Closed left hip fracture, initial encounter (HCC) 01/02/2024   ABLA (acute blood loss anemia) 12/20/2023   Renal mass 12/20/2023   Trochanteric fracture of femur (HCC) 12/20/2023   Closed left hip fracture (HCC) 11/16/2023   Pseudohyponatremia 11/16/2023   History of pneumonia 09/30/2023   CHF (congestive heart failure) (HCC) 06/10/2023   Acute on chronic combined systolic and diastolic heart failure (HCC) 06/08/2023   Obesity, class 1 04/11/2023   Acute respiratory failure with hypoxia (HCC) 04/09/2023   Acute on chronic systolic CHF (congestive heart failure) (HCC) 04/09/2023   Psychogenic nonepileptic seizure 02/01/2023   Obesity (BMI 30-39.9) 01/23/2023   Memory change 07/31/2022   Drug-induced myopathy 07/28/2021   Tachycardia 01/09/2021   DNR (do not resuscitate) 04/13/2020   Hematoma 04/05/2020   Epigastric abdominal pain  04/01/2020   History of melanoma 2022   Medicare annual wellness visit, subsequent 01/03/2019   Recurrent Epistaxis 03/02/2017   Vitamin B 12 deficiency 08/17/2015   Elevated TSH 10/06/2014   AKI (acute kidney injury) 10/05/2014   Unsteady gait 09/18/2014   Midline low back pain without sciatica 09/18/2014   Tremor 05/04/2014   Chronic  combined systolic and diastolic congestive heart failure (HCC) 01/27/2014   Ataxia 01/10/2014   UTI (urinary tract infection) 01/02/2014   History of coronary artery disease    Hypokalemia    Chest pain 12/15/2013   Healthcare maintenance 11/10/2013   Advance care planning 11/10/2013   ED (erectile dysfunction) 06/24/2013   HLD (hyperlipidemia) 03/22/2012   Type 2 diabetes mellitus with vascular disease (HCC) 03/22/2012   Thyroid  cyst 03/19/2011   BPH with obstruction/lower urinary tract symptoms 03/15/2010   GASTRITIS 02/21/2007   Anxiety 02/15/2007   CAD (coronary artery disease) 02/15/2007   DIVERTICULOSIS OF COLON 02/15/2007   COLONIC POLYPS 02/12/2007   Hypertension associated with diabetes (HCC) 02/12/2007   RENAL CALCULUS 02/12/2007   Osteoarthritis 02/12/2007   PCP:  [name], MD Pharmacy:   [facility] - [place], [place] - [address] [place] [address] Phone: [phone] Fax: [phone]  [facility]) - [place], [place] - [address] [place] [address] [place] [address] Phone: [phone] Fax: [phone]  [facility] - [place], [place] - [address] [place] [address] Phone: [phone] Fax: [phone]     Social Drivers of Health (SDOH) Social History: SDOH Screenings   Food Insecurity: No Food Insecurity (01/02/2024)  Housing: Low Risk  (01/02/2024)  Transportation Needs: No Transportation Needs (01/02/2024)  Utilities: Not At Risk (01/02/2024)  Alcohol Screen: Low Risk  (07/23/2023)  Depression (PHQ2-9): Medium Risk (10/23/2023)  Financial Resource Strain: Low Risk  (07/23/2023)  Physical Activity: Inactive (07/23/2023)  Social Connections: Moderately Isolated (01/02/2024)  Stress: No Stress Concern Present (07/23/2023)  Tobacco Use: Low Risk  (01/02/2024)  Health Literacy: Inadequate Health  Literacy (07/23/2023)   SDOH Interventions:     Readmission Risk Interventions    11/24/2023    2:00 PM 04/17/2023   10:53 AM 04/10/2023    1:41 PM  Readmission Risk Prevention Plan  Transportation Screening Complete Complete Complete  PCP or Specialist Appt within 5-7 Days  Complete Complete  PCP or Specialist Appt within 3-5 Days Complete    Home Care Screening  Complete Complete  Medication Review (RN CM)  Complete Complete  HRI or Home Care Consult Complete    Social Work Consult for Recovery Care Planning/Counseling Complete    Palliative Care Screening Not Applicable    Medication Review Oceanographer) Complete

## 2024-01-04 NOTE — Progress Notes (Signed)
 Physical Therapy Treatment Patient Details Name: [name] MRN: [id number] DOB: 1934/11/16 Today's Date: 01/04/2024   History of Present Illness 89 y/o M presenting to ED on 11/21 after mechanical fall, found to have L periprosthetic femoral shaft fx around previous IMN, s/p IMN on 11/22.     PMH includes anxiety, CAD, HLD, DM2 on insulin , nephrolithaisis, BPH    PT Comments  Pt continues to report a lot of pain in L hip however improved after AA rom to L hip in supine. Pt with improved ability to transfer and take steps to chair however demonstrates anxiety and fear of worsening pain limiting ability to progress ambulation despite max encouragement. Pt remains appropriate for inpatient rehab program < 3 hrs a day to address above deficits and achieve safe level of function to return home with son. Acute PT to cont to follow.    If plan is discharge home, recommend the following: Two people to help with walking and/or transfers;A lot of help with bathing/dressing/bathroom   Can travel by private vehicle     No  Equipment Recommendations  None recommended by PT    Recommendations for Other Services       Precautions / Restrictions Precautions Precautions: Fall Recall of Precautions/Restrictions: Intact Restrictions Weight Bearing Restrictions Per Provider Order: Yes LLE Weight Bearing Per Provider Order: Weight bearing as tolerated     Mobility  Bed Mobility Overal bed mobility: Needs Assistance Bed Mobility: Supine to Sit     Supine to sit: Mod assist     General bed mobility comments: Cues and min assist for moving LEs towards EOB; Good use of bedrail to push up to sitting; Mod assist to comletely elevate trunk to sitting and to use bed pads to square off hips at EOB    Transfers Overall transfer level: Needs assistance Equipment used: Rolling walker (2 wheels) Transfers: Sit to/from Stand, Bed to chair/wheelchair/BSC Sit to Stand: Mod assist   Step pivot  transfers: Mod assist       General transfer comment: verbal cues to sequence stepping, 2nd person for line management, pt took 7 steps but declined ambulation    Ambulation/Gait               General Gait Details: pt declined further amb s/p 7 steps to chair   Stairs             Wheelchair Mobility     Tilt Bed    Modified Rankin (Stroke Patients Only)       Balance Overall balance assessment: Needs assistance   Sitting balance-Leahy Scale: Fair Sitting balance - Comments: sits unsupported but with R lateral lean to offload LLE   Standing balance support: During functional activity, Bilateral upper extremity supported, Reliant on assistive device for balance Standing balance-Leahy Scale: Poor Standing balance comment: heavy reliance on external support                            Communication Communication Communication: Impaired Factors Affecting Communication: Hearing impaired;Reduced clarity of speech  Cognition Arousal: Alert Behavior During Therapy: Anxious   PT - Cognitive impairments: Attention                       PT - Cognition Comments: Appeared internally distracted by pain and fear of worsening pain with mobility Following commands: Intact      Cueing Cueing Techniques: Verbal cues, Tactile cues  Exercises General  Exercises - Lower Extremity Ankle Circles/Pumps: AROM, Both, 10 reps, Supine Quad Sets: AROM, Both, 10 reps, Supine Gluteal Sets: AROM, Both, 10 reps, Supine Long Arc Quad: AROM, Left, 10 reps, Seated Hip Flexion/Marching: AAROM, Left, 10 reps, Supine    General Comments General comments (skin integrity, edema, etc.): VSS      Pertinent Vitals/Pain Pain Assessment Pain Assessment: Faces Faces Pain Scale: Hurts even more Pain Location: L hip Pain Descriptors / Indicators: Aching, Grimacing, Guarding Pain Intervention(s): Limited activity within patient's tolerance    Home Living                           Prior Function            PT Goals (current goals can now be found in the care plan section) Acute Rehab PT Goals Patient Stated Goal: Did not specifically state; Agrees to getting OOB PT Goal Formulation: Patient unable to participate in goal setting Time For Goal Achievement: 01/17/24 Potential to Achieve Goals: Good Progress towards PT goals: Progressing toward goals    Frequency    Min 2X/week      PT Plan      Co-evaluation              AM-PAC PT 6 Clicks Mobility   Outcome Measure  Help needed turning from your back to your side while in a flat bed without using bedrails?: A Little Help needed moving from lying on your back to sitting on the side of a flat bed without using bedrails?: A Lot Help needed moving to and from a bed to a chair (including a wheelchair)?: A Lot Help needed standing up from a chair using your arms (e.g., wheelchair or bedside chair)?: A Lot Help needed to walk in hospital room?: Total Help needed climbing 3-5 steps with a railing? : Total 6 Click Score: 11    End of Session Equipment Utilized During Treatment: Gait belt Activity Tolerance: Patient tolerated treatment well Patient left: in chair;with call bell/phone within reach;with chair alarm set Nurse Communication: Mobility status PT Visit Diagnosis: Unsteadiness on feet (R26.81);History of falling (Z91.81);Pain Pain - Right/Left: Left Pain - part of body: Hip     Time: 0903-0927 PT Time Calculation (min) (ACUTE ONLY): 24 min  Charges:    $Therapeutic Exercise: 8-22 mins $Therapeutic Activity: 8-22 mins PT General Charges $$ ACUTE PT VISIT: 1 Visit                     Norene Ames, PT, DPT Acute Rehabilitation Services Secure chat preferred Office #: [phone]    Norene CHRISTELLA Ames 01/04/2024, 11:04 AM

## 2024-01-04 NOTE — Progress Notes (Addendum)
 Orthopaedic Trauma Progress Note  SUBJECTIVE: Resting comfortably in bedside chair this afternoon.  Notes that pain in the left leg is fairly well-controlled.  Denies any numbness or tingling throughout the left lower extremity.  No chest pain. No SOB. No nausea/vomiting. No other complaints.  No family at bedside currently  OBJECTIVE:  Vitals:   01/04/24 1000 01/04/24 1043  BP: (!) 149/75 (!) 149/75  Pulse: 72   Resp: 16   Temp: 97.7 F (36.5 C)   SpO2: 92%     Opiates Today (MME): Today's  total administered Morphine  Milligram Equivalents: 35 Opiates Yesterday (MME): Yesterday's total administered Morphine  Milligram Equivalents: 40  General: Resting in bed, no acute distress Respiratory: No increased work of breathing.  Operative Extremity (LLE): Dressing stable.  Some soreness over the hip as expected.  Otherwise no significant tenderness throughout the thigh or lower leg.  No calf tenderness.  Tolerates gentle ankle DF/PF.  Foot warm and well-perfused.  2+ DP pulse  IMAGING: Stable post op imaging left femur  LABS:  Results for orders placed or performed during the hospital encounter of 01/01/24 (from the past 24 hours)  Glucose, capillary     Status: Abnormal   Collection Time: 01/03/24  5:59 PM  Result Value Ref Range   Glucose-Capillary 275 (H) 70 - 99 mg/dL  Glucose, capillary     Status: Abnormal   Collection Time: 01/03/24  9:00 PM  Result Value Ref Range   Glucose-Capillary 240 (H) 70 - 99 mg/dL  CBC     Status: Abnormal   Collection Time: 01/04/24  4:37 AM  Result Value Ref Range   WBC 9.8 4.0 - 10.5 K/uL   RBC 3.45 (L) 4.22 - 5.81 MIL/uL   Hemoglobin 10.6 (L) 13.0 - 17.0 g/dL   HCT 67.5 (L) 60.9 - 47.9 %   MCV 93.9 80.0 - 100.0 fL   MCH 30.7 26.0 - 34.0 pg   MCHC 32.7 30.0 - 36.0 g/dL   RDW 85.3 88.4 - 84.4 %   Platelets 272 150 - 400 K/uL   nRBC 0.0 0.0 - 0.2 %  Glucose, capillary     Status: Abnormal   Collection Time: 01/04/24  7:44 AM  Result Value  Ref Range   Glucose-Capillary 218 (H) 70 - 99 mg/dL   Comment 1 Notify RN   Glucose, capillary     Status: Abnormal   Collection Time: 01/04/24 11:54 AM  Result Value Ref Range   Glucose-Capillary 218 (H) 70 - 99 mg/dL   Comment 1 Notify RN     ASSESSMENT: [name] is a 89 y.o. male, 2 Days Post-Op s/p ground-level fall Procedures:  INTRAMEDULLARY NAILING OF LEFT PERIPROSTHETIC FEMORAL SHAFT FRACTURE REVISION FIXATION OF LEFT INTERTROCHANTERIC FEMUR FRACTURE REMOVAL OF HARDWARE LEFT PROXIMAL FEMUR  CV/Blood loss: Acute blood loss anemia, Hgb 10.6 this AM. Hemodynamically stable  PLAN: Weightbearing: WBAT LLE ROM: Unrestricted ROM Incisional and dressing care: Dressing changes starting today Showering: Okay to be getting incisions wet starting 01/05/2024 Orthopedic device(s): None  Pain management:  1. Tylenol  650 mg q 6 hours  2. Robaxin  500 mg q 8 hours PRN 3. Tramadol  50-100 mg q 6 hours PRN 4. Dilaudid  0.5-1 mg q 3 hours PRN VTE prophylaxis: Lovenox , SCDs ID:  Ancef  2gm post op completed Foley/Lines:  No foley, KVO IVFs Impediments to Fracture Healing: Vitamin D  level looks okay at 32, no additional supplementation needed Dispo: PT/OT evaluation ongoing, recommending SNF.  Ortho issues stable. Okay for  discharge from ortho standpoint once cleared by medicine team and therapies  D/C recommendations: - Tramadol , Robaxin , Tylenol  for pain control - Eliquis  2.5 mg twice daily x 30 days for DVT prophylaxis - No additional need for Vit D supplementation  Follow - up plan: 2 weeks after d/c for wound check and repeat x-rays   Contact information:  Franky Light MD, Lauraine Moores PA-C. After hours and holidays please check [email or web address] for group call information for [facility]   Lauraine PATRIC Moores, PA-C [phone] (office) [email or web address]

## 2024-01-04 NOTE — Telephone Encounter (Signed)
 Spoke with pt's son, [name] (on dpr), relaying Dr Elfredia message. [name] verbalizes understanding and states that's fine since pt prefers tramadol  anyway, which is what he's getting now. Per [name], pt requests to change pain med be changed to tramadol  and a refill be sent in.

## 2024-01-04 NOTE — Telephone Encounter (Signed)
Noted. Thanks.  Sent.  

## 2024-01-04 NOTE — Plan of Care (Signed)

## 2024-01-04 NOTE — Addendum Note (Signed)
 Addended by: [name] on: 01/04/2024 11:44 AM   Modules accepted: Orders

## 2024-01-04 NOTE — Progress Notes (Signed)
 PROGRESS NOTE    [name]  FMW:[id number] DOB: [birth year] DOA: 01/01/2024 PCP: [name], MD    Brief Narrative:  This 89 yrs old Male with PMH significant for anxiety, coronary artery disease, hyperlipidemia, type 2 diabetes on insulin , Nephrolithiasis, BPH presented in the ED status post mechanical fall at home.  According to the son patient went for an MRI earlier in the day because he was noted to have a mass on the kidney and they wanted further imaging. He returned home and ate lunch and then decided to head to the living room and then fell and hit the left hip.  He was unable to stand.  X-ray hip shows acute periprosthetic fracture into the proximal left femoral shaft with mild displacement.  Patient was admitted for further evaluation.  Orthopedics is consulted.  Patient underwent ORIF.  POD # 2  Assessment & Plan:   Principal Problem:   Closed left hip fracture, initial encounter (HCC)  Closed left hip fracture: Patient is status post mechanical fall with left hip pain. X-ray shows acute periprosthetic fracture into the proximal left femoral shaft with mild displacement. Continue adequate pain control. Orthopedics is consulted,  Patient underwent ORIF POD # 2. PT and OT evaluation. > SNF Weightbearing as tolerated left lower extremity.  Chronic Combined systolic and diastolic heart failure: Does not appear in acute exacerbation. Continue Lasix  40 mg daily. Continue losartan  25 mg daily. Coreg  12.5 mg twice daily. Daily weight , intake/ output charting.  Essential hypertension: Continue Norvasc , Lasix , Coreg , losartan .  Hyperlipidemia Will resume statin.  Type 2 diabetes: Hold p.o. diabetic medications,  Continue low-dose sliding scale. Continue Lantus  16 unit daily.  DVT prophylaxis: Lovenox  Code Status: Full code Family Communication: Sister at bed side Disposition Plan:    Status is: Inpatient Remains inpatient appropriate because: Admitted  status post mechanical fall with left femoral shaft fracture.   Status post ORIF POD # 2.  PT and OT recommended SNF.   Consultants:  Orthopedics  Procedures: S/P ORIF POD # 2   Antimicrobials: Anti-infectives (From admission, onward)    Start     Dose/Rate Route Frequency Ordered Stop   01/02/24 1400  ceFAZolin  (ANCEF ) IVPB 2g/100 mL premix        2 g 200 mL/hr over 30 Minutes Intravenous Every 8 hours 01/02/24 1221 01/03/24 0648   01/02/24 0948  vancomycin  (VANCOCIN ) powder  Status:  Discontinued          As needed 01/02/24 0948 01/02/24 1057   01/02/24 0845  ceFAZolin  (ANCEF ) IVPB 2g/100 mL premix        2 g 200 mL/hr over 30 Minutes Intravenous  Once 01/02/24 0844 01/02/24 0940      Subjective: Patient was seen and examined at bedside.  Overnight events noted. Patient was sitting comfortably on the recliner,  status post ORIF POD # 2. Patient reports pain is reasonably controlled.  He is awaiting insurance authorization for SNF.  Objective: Vitals:   01/04/24 0514 01/04/24 0815 01/04/24 1000 01/04/24 1043  BP: (!) 160/66 (!) 152/64 (!) 149/75 (!) 149/75  Pulse: 63 71 72   Resp: 17  16   Temp: 97.6 F (36.4 C)  97.7 F (36.5 C)   TempSrc: Oral  Oral   SpO2: 93%  92%   Weight:      Height:        Intake/Output Summary (Last 24 hours) at 01/04/2024 1332 Last data filed at 01/04/2024 0600 Gross per 24 hour  Intake 360 ml  Output 2450 ml  Net -2090 ml   Filed Weights   01/02/24 0115  Weight: 94.3 kg    Examination:  General exam: Appears calm and comfortable, not in any acute distress. Respiratory system: CTA Bilaterally. Respiratory effort normal. RR 14 Cardiovascular system: S1 & S2 heard, RRR. No JVD, murmurs, rubs, gallops or clicks.  Gastrointestinal system: Abdomen is non distended, soft and non tender.  Normal bowel sounds heard. Central nervous system: Alert and oriented x 3. No focal neurological deficits. Extremities:  Left Hip pain, Status post  ORIF.  POD # 2. Skin: No rashes, lesions or ulcers Psychiatry: Judgement and insight appear normal. Mood & affect appropriate.     Data Reviewed: I have personally reviewed following labs and imaging studies  CBC: Recent Labs  Lab 01/01/24 2147 01/03/24 0612 01/04/24 0437  WBC 9.0 11.0* 9.8  NEUTROABS 5.6  --   --   HGB 13.7 11.1* 10.6*  HCT 41.0 33.6* 32.4*  MCV 92.8 93.9 93.9  PLT 333 276 272   Basic Metabolic Panel: Recent Labs  Lab 01/01/24 2147 01/03/24 0612  NA 135 138  K 3.5 3.3*  CL 100 101  CO2 24 26  GLUCOSE 242* 192*  BUN 26* 25*  CREATININE 1.40* 1.45*  CALCIUM  9.5 9.2   GFR: Estimated Creatinine Clearance: 42.1 mL/min (A) (by C-G formula based on SCr of 1.45 mg/dL (H)). Liver Function Tests: No results for input(s): AST, ALT, ALKPHOS, BILITOT, PROT, ALBUMIN  in the last 168 hours. No results for input(s): LIPASE, AMYLASE in the last 168 hours. No results for input(s): AMMONIA in the last 168 hours. Coagulation Profile: Recent Labs  Lab 01/01/24 2147  INR 0.9   Cardiac Enzymes: No results for input(s): CKTOTAL, CKMB, CKMBINDEX, TROPONINI in the last 168 hours. BNP (last 3 results) No results for input(s): PROBNP in the last 8760 hours. HbA1C: No results for input(s): HGBA1C in the last 72 hours. CBG: Recent Labs  Lab 01/03/24 1230 01/03/24 1759 01/03/24 2100 01/04/24 0744 01/04/24 1154  GLUCAP 224* 275* 240* 218* 218*   Lipid Profile: No results for input(s): CHOL, HDL, LDLCALC, TRIG, CHOLHDL, LDLDIRECT in the last 72 hours. Thyroid  Function Tests: No results for input(s): TSH, T4TOTAL, FREET4, T3FREE, THYROIDAB in the last 72 hours. Anemia Panel: No results for input(s): VITAMINB12, FOLATE, FERRITIN, TIBC, IRON, RETICCTPCT in the last 72 hours. Sepsis Labs: No results for input(s): PROCALCITON, LATICACIDVEN in the last 168 hours.  Recent Results (from the past 240  hours)  Surgical pcr screen     Status: None   Collection Time: 01/02/24  2:59 AM   Specimen: Nasal Mucosa; Nasal Swab  Result Value Ref Range Status   MRSA, PCR NEGATIVE NEGATIVE Final   Staphylococcus aureus NEGATIVE NEGATIVE Final    Comment: (NOTE) The Xpert SA Assay (FDA approved for NASAL specimens in patients 60 years of age and older), is one component of a comprehensive surveillance program. It is not intended to diagnose infection nor to guide or monitor treatment. Performed at [facility], [address] [place], [place] [address]     Radiology Studies: No results found.  Scheduled Meds:  acetaminophen   650 mg Oral Q6H   amLODipine   5 mg Oral q morning   aspirin  EC  81 mg Oral Daily   carvedilol   12.5 mg Oral BID WC   cyanocobalamin   1,000 mcg Oral Daily   docusate sodium   100 mg Oral BID   enoxaparin  (  LOVENOX ) injection  40 mg Subcutaneous Q24H   escitalopram   20 mg Oral Daily   furosemide   40 mg Oral Daily   hydrALAZINE   50 mg Oral TID   insulin  aspart  0-9 Units Subcutaneous TID WC   insulin  glargine-yfgn  16 Units Subcutaneous Q2200   losartan   25 mg Oral Daily   senna-docusate  3 tablet Oral QHS   tamsulosin   0.4 mg Oral QPC supper   Continuous Infusions:   LOS: 2 days    Time spent: 35 mins    Darcel Dawley, MD [facility]   If 7PM-7AM, please contact night-coverage

## 2024-01-04 NOTE — Discharge Instructions (Addendum)
 Orthopaedic Trauma Service Discharge Instructions  Discharge Wound Care Instructions  Wound Care: You may remove your surgical dressing on postoperative day #3 (Tuesday, 01/05/2024). Incisions can be left open to air if there is no drainage.   If any drainage is noted, use foam dressing (Mepilex)  Once the incision is completely dry and without drainage, it may be left open to air out.  Showering may begin postoperative day #4 (Wednesday, 01/06/2024).  Clean incision gently with soap and water .  Do NOT apply any ointments, solutions or lotions to pin sites or surgical wounds.  These prevent needed drainage and even though solutions like hydrogen peroxide kill bacteria, they also damage cells lining the pin sites that help fight infection.  Applying lotions or ointments can keep the wounds moist and can cause them to breakdown and open up as well. This can increase the risk for infection. When in doubt call the office.   General Discharge Instructions  WEIGHT BEARING STATUS: Weightbearing as tolerated  RANGE OF MOTION/ACTIVITY: Unrestricted hip and knee range of motion  DVT/PE prophylaxis: Eliquis  twice daily x 30 days  Diet: as you were eating previously.  Can use over the counter stool softeners and bowel preparations, such as Miralax , to help with bowel movements.  Narcotics can be constipating.  Be sure to drink plenty of fluids  PAIN MEDICATION USE AND EXPECTATIONS  You have likely been given narcotic medications to help control your pain.  After a traumatic event that results in an fracture (broken bone) with or without surgery, it is ok to use narcotic pain medications to help control one's pain.  We understand that everyone responds to pain differently and each individual patient will be evaluated on a regular basis for the continued need for narcotic medications. Ideally, narcotic medication use should last no more than 6-8 weeks (coinciding with fracture healing).   As a patient  it is your responsibility as well to monitor narcotic medication use and report the amount and frequency you use these medications when you come to your office visit.   We would also advise that if you are using narcotic medications, you should take a dose prior to therapy to maximize you participation.  IF YOU ARE ON NARCOTIC MEDICATIONS IT IS NOT PERMISSIBLE TO OPERATE A MOTOR VEHICLE (MOTORCYCLE/CAR/TRUCK/MOPED) OR HEAVY MACHINERY DO NOT MIX NARCOTICS WITH OTHER CNS (CENTRAL NERVOUS SYSTEM) DEPRESSANTS SUCH AS ALCOHOL  POST-OPERATIVE OPIOID TAPER INSTRUCTIONS: It is important to wean off of your opioid medication as soon as possible. If you do not need pain medication after your surgery it is ok to stop day one. Opioids include: Codeine, Hydrocodone (Norco, Vicodin), Oxycodone (Percocet, oxycontin ) and hydromorphone  amongst others.  Long term and even short term use of opiods can cause: Increased pain response Dependence Constipation Depression Respiratory depression And more.  Withdrawal symptoms can include Flu like symptoms Nausea, vomiting And more Techniques to manage these symptoms Hydrate well Eat regular healthy meals Stay active Use relaxation techniques(deep breathing, meditating, yoga) Do Not substitute Alcohol to help with tapering If you have been on opioids for less than two weeks and do not have pain than it is ok to stop all together.  Plan to wean off of opioids This plan should start within one week post op of your fracture surgery  Maintain the same interval or time between taking each dose and first decrease the dose.  Cut the total daily intake of opioids by one tablet each day Next start to increase the time  between doses. The last dose that should be eliminated is the evening dose.    STOP SMOKING OR USING NICOTINE PRODUCTS!!!!  As discussed nicotine severely impairs your body's ability to heal surgical and traumatic wounds but also impairs bone healing.   Wounds and bone heal by forming microscopic blood vessels (angiogenesis) and nicotine is a vasoconstrictor (essentially, shrinks blood vessels).  Therefore, if vasoconstriction occurs to these microscopic blood vessels they essentially disappear and are unable to deliver necessary nutrients to the healing tissue.  This is one modifiable factor that you can do to dramatically increase your chances of healing your injury.  (This means no smoking, no nicotine gum, patches, etc)  DO NOT USE NONSTEROIDAL ANTI-INFLAMMATORY DRUGS (NSAID'S)  Using products such as Advil  (ibuprofen ), Aleve  (naproxen ), Motrin  (ibuprofen ) for additional pain control during fracture healing can delay and/or prevent the healing response.  If you would like to take over the counter (OTC) medication, Tylenol  (acetaminophen ) is ok.  However, some narcotic medications that are given for pain control contain acetaminophen  as well. Therefore, you should not exceed more than 4000 mg of tylenol  in a day if you do not have liver disease.  Also note that there are may OTC medicines, such as cold medicines and allergy medicines that my contain tylenol  as well.  If you have any questions about medications and/or interactions please ask your doctor/PA or your pharmacist.      ICE AND ELEVATE INJURED/OPERATIVE EXTREMITY  Using ice and elevating the injured extremity above your heart can help with swelling and pain control.  Icing in a pulsatile fashion, such as 20 minutes on and 20 minutes off, can be followed.    Do not place ice directly on skin. Make sure there is a barrier between to skin and the ice pack.    Using frozen items such as frozen peas works well as the conform nicely to the are that needs to be iced.  USE AN ACE WRAP OR TED HOSE FOR SWELLING CONTROL  In addition to icing and elevation, Ace wraps or TED hose are used to help limit and resolve swelling.  It is recommended to use Ace wraps or TED hose until you are informed to stop.     When using Ace Wraps start the wrapping distally (farthest away from the body) and wrap proximally (closer to the body)   Example: If you had surgery on your leg or thing and you do not have a splint on, start the ace wrap at the toes and work your way up to the thigh        If you had surgery on your upper extremity and do not have a splint on, start the ace wrap at your fingers and work your way up to the upper arm   CALL THE OFFICE FOR MEDICATION REFILLS OR WITH ANY QUESTIONS/CONCERNS: [phone]   VISIT OUR WEBSITE FOR ADDITIONAL INFORMATION: [email or web address]   Call office for the following: Temperature greater than 101F Persistent nausea and vomiting Severe uncontrolled pain Redness, tenderness, or signs of infection (pain, swelling, redness, odor or green/yellow discharge around the site) Difficulty breathing, headache or visual disturbances Hives Persistent dizziness or light-headedness Extreme fatigue Any other questions or concerns you may have after discharge  In an emergency, call 911 or go to an Emergency Department at a nearby hospital  OTHER HELPFUL INFORMATION  If you had a block, it will wear off between 8-24 hrs postop typically.  This is period when your pain may  go from nearly zero to the pain you would have had postop without the block.  This is an abrupt transition but nothing dangerous is happening.  You may take an extra dose of narcotic when this happens.  You should wean off your narcotic medicines as soon as you are able.  Most patients will be off or using minimal narcotics before their first postop appointment.   We suggest you use the pain medication the first night prior to going to bed, in order to ease any pain when the anesthesia wears off. You should avoid taking pain medications on an empty stomach as it will make you nauseous.  Do not drink alcoholic beverages or take illicit drugs when taking pain medications.  In most states it is against  the law to drive while you are in a splint or sling.  And certainly against the law to drive while taking narcotics.  You may return to work/school in the next couple of days when you feel up to it.   Pain medication may make you constipated.  Below are a few solutions to try in this order: Decrease the amount of pain medication if you aren't having pain. Drink lots of decaffeinated fluids. Drink prune juice and/or each dried prunes  If the first 3 don't work start with additional solutions Take Colace - an over-the-counter stool softener Take Senokot - an over-the-counter laxative Take Miralax  - a stronger over-the-counter laxative

## 2024-01-05 ENCOUNTER — Inpatient Hospital Stay (HOSPITAL_COMMUNITY)

## 2024-01-05 ENCOUNTER — Telehealth: Payer: Self-pay | Admitting: Family Medicine

## 2024-01-05 DIAGNOSIS — S72002A Fracture of unspecified part of neck of left femur, initial encounter for closed fracture: Secondary | ICD-10-CM | POA: Diagnosis not present

## 2024-01-05 LAB — GLUCOSE, CAPILLARY
Glucose-Capillary: 266 mg/dL — ABNORMAL HIGH (ref 70–99)
Glucose-Capillary: 184 mg/dL — ABNORMAL HIGH (ref 70–99)
Glucose-Capillary: 235 mg/dL — ABNORMAL HIGH (ref 70–99)
Glucose-Capillary: 213 mg/dL — ABNORMAL HIGH (ref 70–99)

## 2024-01-05 LAB — BASIC METABOLIC PANEL WITH GFR
Anion gap: 10 (ref 5–15)
BUN: 22 mg/dL (ref 8–23)
CO2: 26 mmol/L (ref 22–32)
Calcium: 9.2 mg/dL (ref 8.9–10.3)
Chloride: 99 mmol/L (ref 98–111)
Creatinine, Ser: 1.15 mg/dL (ref 0.61–1.24)
GFR, Estimated: >60 mL/min (ref >60)
Glucose, Bld: 179 mg/dL — ABNORMAL HIGH (ref 70–99)
Potassium: 3.3 mmol/L — ABNORMAL LOW (ref 3.5–5.1)
Sodium: 135 mmol/L (ref 135–145)

## 2024-01-05 LAB — CBC
HCT: 33.9 % — ABNORMAL LOW (ref 39.0–52.0)
Hemoglobin: 11.1 g/dL — ABNORMAL LOW (ref 13.0–17.0)
MCH: 30.7 pg (ref 26.0–34.0)
MCHC: 32.7 g/dL (ref 30.0–36.0)
MCV: 93.6 fL (ref 80.0–100.0)
Platelets: 283 K/uL (ref 150–400)
RBC: 3.62 MIL/uL — ABNORMAL LOW (ref 4.22–5.81)
RDW: 14.1 % (ref 11.5–15.5)
WBC: 9.6 K/uL (ref 4.0–10.5)
nRBC: 0 % (ref 0.0–0.2)

## 2024-01-05 LAB — PHOSPHORUS: Phosphorus: 2.5 mg/dL (ref 2.5–4.6)

## 2024-01-05 LAB — MAGNESIUM: Magnesium: 1.7 mg/dL (ref 1.7–2.4)

## 2024-01-05 MED ORDER — POTASSIUM CHLORIDE 20 MEQ PO PACK
40.0000 meq | PACK | Freq: Once | ORAL | Status: AC
  Administered 2024-01-05: 40 meq via ORAL
  Filled 2024-01-05: qty 2

## 2024-01-05 MED ORDER — AMLODIPINE BESYLATE 10 MG PO TABS
10.0000 mg | ORAL_TABLET | Freq: Every morning | ORAL | Status: DC
  Administered 2024-01-06 – 2024-01-08 (×3): 10 mg via ORAL
  Filled 2024-01-05 (×3): qty 1

## 2024-01-05 NOTE — Telephone Encounter (Signed)
 Agreed, this should come through the inpatient team.  Thanks.

## 2024-01-05 NOTE — Plan of Care (Signed)
  Problem: Nutrition: Goal: Adequate nutrition will be maintained Outcome: Progressing   Problem: Coping: Goal: Level of anxiety will decrease Outcome: Progressing   Problem: Pain Managment: Goal: General experience of comfort will improve and/or be controlled Outcome: Progressing

## 2024-01-05 NOTE — Telephone Encounter (Signed)
 Called patient son he had another fall while in the office. He will not be discharged anytime soon. He thought that we needed to put order in advised that would be done by hospital.

## 2024-01-05 NOTE — Progress Notes (Signed)
 PROGRESS NOTE    [name]  FMW:[id number] DOB: 05-18-1934 DOA: 01/01/2024 PCP: [name], MD    Brief Narrative:  This 89 yrs old Male with PMH significant for anxiety, coronary artery disease, hyperlipidemia, type 2 diabetes on insulin , Nephrolithiasis, BPH presented in the ED status post mechanical fall at home.  According to the son patient went for an MRI earlier in the day because he was noted to have a mass on the kidney and they wanted further imaging. He returned home and ate lunch and then decided to head to the living room and then fell and hit the left hip.  He was unable to stand.  X-ray hip shows acute periprosthetic fracture into the proximal left femoral shaft with mild displacement.  Patient was admitted for further evaluation.  Orthopedics is consulted.  Patient underwent ORIF.  POD # 2  Assessment & Plan:   Principal Problem:   Closed left hip fracture, initial encounter (HCC)  Closed left hip fracture: Patient is status post mechanical fall with left hip pain. X-ray shows acute periprosthetic fracture into the proximal left femoral shaft with mild displacement. Continue adequate pain control. Orthopedics is consulted,  Patient underwent ORIF POD # 3. PT and OT evaluation. > SNF Weightbearing as tolerated left lower extremity. Ortho signed off.  Okay to discharge from Ortho to SNF. Ortho recommended Eliquis  2.5 twice daily for 30 days for DVT prophylaxis.  Chronic Combined systolic and diastolic heart failure: Does not appear in acute exacerbation. Continue Lasix  40 mg daily. Continue losartan  25 mg daily. Coreg  12.5 mg twice daily. Daily weight , intake/ output charting.  Essential hypertension: Continue Norvasc , Lasix , Coreg , losartan .  Hyperlipidemia: LDL 103.  Not on statin.  Type 2 diabetes: Hold p.o. diabetic medications,  Continue low-dose sliding scale. Continue Lantus  16 unit daily.  DVT prophylaxis: Lovenox  Code Status: Full  code Family Communication: Sister at bed side. Disposition Plan:    Status is: Inpatient Remains inpatient appropriate because: Admitted status post mechanical fall with left femoral shaft fracture.   Status post ORIF POD # 3.  PT and OT recommended SNF.  Medically clear, awaiting SNF placement   Consultants:  Orthopedics  Procedures: S/P ORIF POD # 3   Antimicrobials: Anti-infectives (From admission, onward)    Start     Dose/Rate Route Frequency Ordered Stop   01/02/24 1400  ceFAZolin  (ANCEF ) IVPB 2g/100 mL premix        2 g 200 mL/hr over 30 Minutes Intravenous Every 8 hours 01/02/24 1221 01/03/24 0648   01/02/24 0948  vancomycin  (VANCOCIN ) powder  Status:  Discontinued          As needed 01/02/24 0948 01/02/24 1057   01/02/24 0845  ceFAZolin  (ANCEF ) IVPB 2g/100 mL premix        2 g 200 mL/hr over 30 Minutes Intravenous  Once 01/02/24 0844 01/02/24 0940      Subjective: Patient was seen and examined at bedside.  Overnight events noted. Patient was sitting comfortably on the recliner,  status post ORIF POD # 3. Patient reports pain is reasonably controlled.  He is awaiting insurance authorization for SNF.  Objective: Vitals:   01/04/24 2138 01/05/24 0615 01/05/24 0844 01/05/24 0906  BP: (!) 157/64 (!) 167/65 (!) 184/79 (!) 184/79  Pulse: 71 75 85 85  Resp: 18  19   Temp: 98.1 F (36.7 C) 98.4 F (36.9 C) 98.3 F (36.8 C)   TempSrc: Oral Oral    SpO2: 93% 93%  93%   Weight:      Height:        Intake/Output Summary (Last 24 hours) at 01/05/2024 1337 Last data filed at 01/05/2024 1200 Gross per 24 hour  Intake 840 ml  Output 1200 ml  Net -360 ml   Filed Weights   01/02/24 0115  Weight: 94.3 kg    Examination:  General exam: Appears calm and comfortable, not in any acute distress. Respiratory system: CTA Bilaterally. Respiratory effort normal. RR 15 Cardiovascular system: S1 & S2 heard, RRR. No JVD, murmurs, rubs, gallops or clicks.  Gastrointestinal  system: Abdomen is non distended, soft and non tender.  Normal bowel sounds heard. Central nervous system: Alert and oriented x 3. No focal neurological deficits. Extremities:  Left Hip pain, Status post ORIF.  POD # 3. Skin: No rashes, lesions or ulcers Psychiatry: Judgement and insight appear normal. Mood & affect appropriate.     Data Reviewed: I have personally reviewed following labs and imaging studies  CBC: Recent Labs  Lab 01/01/24 2147 01/03/24 0612 01/04/24 0437 01/05/24 0515  WBC 9.0 11.0* 9.8 9.6  NEUTROABS 5.6  --   --   --   HGB 13.7 11.1* 10.6* 11.1*  HCT 41.0 33.6* 32.4* 33.9*  MCV 92.8 93.9 93.9 93.6  PLT 333 276 272 283   Basic Metabolic Panel: Recent Labs  Lab 01/01/24 2147 01/03/24 0612 01/05/24 0515  NA 135 138 135  K 3.5 3.3* 3.3*  CL 100 101 99  CO2 24 26 26   GLUCOSE 242* 192* 179*  BUN 26* 25* 22  CREATININE 1.40* 1.45* 1.15  CALCIUM  9.5 9.2 9.2  MG  --   --  1.7  PHOS  --   --  2.5   GFR: Estimated Creatinine Clearance: 53.1 mL/min (by C-G formula based on SCr of 1.15 mg/dL). Liver Function Tests: No results for input(s): AST, ALT, ALKPHOS, BILITOT, PROT, ALBUMIN  in the last 168 hours. No results for input(s): LIPASE, AMYLASE in the last 168 hours. No results for input(s): AMMONIA in the last 168 hours. Coagulation Profile: Recent Labs  Lab 01/01/24 2147  INR 0.9   Cardiac Enzymes: No results for input(s): CKTOTAL, CKMB, CKMBINDEX, TROPONINI in the last 168 hours. BNP (last 3 results) No results for input(s): PROBNP in the last 8760 hours. HbA1C: No results for input(s): HGBA1C in the last 72 hours. CBG: Recent Labs  Lab 01/04/24 1154 01/04/24 1652 01/04/24 2135 01/05/24 0842 01/05/24 1133  GLUCAP 218* 175* 255* 266* 184*   Lipid Profile: No results for input(s): CHOL, HDL, LDLCALC, TRIG, CHOLHDL, LDLDIRECT in the last 72 hours. Thyroid  Function Tests: No results for input(s):  TSH, T4TOTAL, FREET4, T3FREE, THYROIDAB in the last 72 hours. Anemia Panel: No results for input(s): VITAMINB12, FOLATE, FERRITIN, TIBC, IRON, RETICCTPCT in the last 72 hours. Sepsis Labs: No results for input(s): PROCALCITON, LATICACIDVEN in the last 168 hours.  Recent Results (from the past 240 hours)  Surgical pcr screen     Status: None   Collection Time: 01/02/24  2:59 AM   Specimen: Nasal Mucosa; Nasal Swab  Result Value Ref Range Status   MRSA, PCR NEGATIVE NEGATIVE Final   Staphylococcus aureus NEGATIVE NEGATIVE Final    Comment: (NOTE) The Xpert SA Assay (FDA approved for NASAL specimens in patients 74 years of age and older), is one component of a comprehensive surveillance program. It is not intended to diagnose infection nor to guide or monitor treatment. Performed at [facility], [address] [place], [place] [address]     Radiology Studies: No results found.  Scheduled Meds:  acetaminophen   650 mg Oral Q6H   [START ON 01/06/2024] amLODipine   10 mg Oral q morning   aspirin  EC  81 mg Oral Daily   carvedilol   12.5 mg Oral BID WC   cyanocobalamin   1,000 mcg Oral Daily   docusate sodium   100 mg Oral BID   enoxaparin  (LOVENOX ) injection  40 mg Subcutaneous Q24H   escitalopram   20 mg Oral Daily   furosemide   40 mg Oral Daily   hydrALAZINE   50 mg Oral TID   insulin  aspart  0-9 Units Subcutaneous TID WC   insulin  glargine-yfgn  16 Units Subcutaneous Q2200   losartan   25 mg Oral Daily   senna-docusate  3 tablet Oral QHS   tamsulosin   0.4 mg Oral QPC supper   Continuous Infusions:   LOS: 3 days    Time spent: 35 mins    Darcel Dawley, MD [facility]   If 7PM-7AM, please contact night-coverage

## 2024-01-05 NOTE — TOC CAGE-AID Note (Signed)
 Transition of Care [facility]) - CAGE-AID Screening   Patient Details  Name: [name] MRN: [id number] Date of Birth: [birth year]  Transition of Care [facility]) CM/SW Contact:    Mauro Arps E Elliana Bal, LCSW Phone Number: 01/05/2024, 9:04 AM   Clinical Narrative: No SA noted.   CAGE-AID Screening:    Have You Ever Felt You Ought to Cut Down on Your Drinking or Drug Use?: No Have People Annoyed You By Critizing Your Drinking Or Drug Use?: No Have You Felt Bad Or Guilty About Your Drinking Or Drug Use?: No Have You Ever Had a Drink or Used Drugs First Thing In The Morning to Steady Your Nerves or to Get Rid of a Hangover?: No CAGE-AID Score: 0  Substance Abuse Education Offered: No

## 2024-01-05 NOTE — Telephone Encounter (Signed)
 Copied from CRM [id number]. Topic: General - Other >> Jan 04, 2024  1:08 PM Thersia BROCKS wrote: Reason for CRM: Patient son called in stated patient fell and refracture left hip, has surgery, is talking about releasing . [facility] like [facility]   [phone]

## 2024-01-05 NOTE — TOC Progression Note (Signed)
 Transition of Care [facility]) - Progression Note    Patient Details  Name: [name] MRN: [id number] Date of Birth: [birth year]  Transition of Care [facility]) CM/SW Contact  Arta Stump LITTIE Moose, [place] Phone Number: 01/05/2024, 2:33 PM  Clinical Narrative:    CSW provided pt with SNF bed offers at bedside. Pt chose [name]. CSW initiated insurance auth process, ref [id number]. CSW updated pt son, [name]. CSW will continue to follow.   Expected Discharge Plan: Skilled Nursing Facility Barriers to Discharge: Insurance Authorization, SNF Pending bed offer, Continued Medical Work up               Expected Discharge Plan and Services       Living arrangements for the past 2 months: Single Family Home                                       Social Drivers of Health (SDOH) Interventions SDOH Screenings   Food Insecurity: No Food Insecurity (01/02/2024)  Housing: Low Risk  (01/02/2024)  Transportation Needs: No Transportation Needs (01/02/2024)  Utilities: Not At Risk (01/02/2024)  Alcohol Screen: Low Risk  (07/23/2023)  Depression (PHQ2-9): Medium Risk (10/23/2023)  Financial Resource Strain: Low Risk  (07/23/2023)  Physical Activity: Inactive (07/23/2023)  Social Connections: Moderately Isolated (01/02/2024)  Stress: No Stress Concern Present (07/23/2023)  Tobacco Use: Low Risk  (01/02/2024)  Health Literacy: Inadequate Health Literacy (07/23/2023)    Readmission Risk Interventions    11/24/2023    2:00 PM 04/17/2023   10:53 AM 04/10/2023    1:41 PM  Readmission Risk Prevention Plan  Transportation Screening Complete Complete Complete  PCP or Specialist Appt within 5-7 Days  Complete Complete  PCP or Specialist Appt within 3-5 Days Complete    Home Care Screening  Complete Complete  Medication Review (RN CM)  Complete Complete  HRI or Home Care Consult Complete    Social Work Consult for Recovery Care Planning/Counseling Complete    Palliative Care Screening Not  Applicable    Medication Review Oceanographer) Complete

## 2024-01-05 NOTE — Progress Notes (Signed)
 Notified by Bernardino, PT that the patient was in the floor. Entered the room Eleanore, RN was in the room assessing the patient asking what happened. Patient stated he just slid out of the recliner on to his bottom reaching for his call light and his legs got tangled up in the suction tubing for the Primofit. Patient said he did not hit his head, but was complaining of intense pain between his buttocks. The chair alarm was set but did not go off due to the amount of linen and cushion the patient was sitting, which may have contributed to the fall.

## 2024-01-05 NOTE — Care Management Important Message (Signed)
 Important Message  Patient Details  Name: [name] MRN: [id number] Date of Birth: 08/11/1934   Important Message Given:  Yes - Medicare IM     Jennie Laneta Dragon 01/05/2024, 1:34 PM

## 2024-01-05 NOTE — Progress Notes (Signed)
 Physical Therapy Treatment Patient Details Name: [name] MRN: [id number] DOB: 1934/08/02 Today's Date: 01/05/2024   History of Present Illness 89 y/o M presenting to ED on 11/21 after mechanical fall, found to have L periprosthetic femoral shaft fx around previous IMN, s/p IMN on 11/22.     PMH includes anxiety, CAD, HLD, DM2 on insulin , nephrolithaisis, BPH    PT Comments  Pt with improved spirits this date and pain appears to be better managed. Pt more attentive to treatment session and more engaged in conversation. Pt began to tolerate short bouts of ambulation with RW this date however continues to require modA for all mobility and ADLs. Pt to cont to benefit from inpatient rehab program < 3 hrs a day to address above deficits and progress towards safe level of function to return home with son. Acute PT to cont to follow.    If plan is discharge home, recommend the following: Two people to help with walking and/or transfers;A lot of help with bathing/dressing/bathroom   Can travel by private vehicle     No  Equipment Recommendations  None recommended by PT    Recommendations for Other Services       Precautions / Restrictions Precautions Precautions: Fall Recall of Precautions/Restrictions: Intact Restrictions Weight Bearing Restrictions Per Provider Order: Yes LLE Weight Bearing Per Provider Order: Weight bearing as tolerated     Mobility  Bed Mobility Overal bed mobility: Needs Assistance Bed Mobility: Supine to Sit     Supine to sit: Mod assist     General bed mobility comments: Cues and min assist for moving LEs towards EOB; Good use of bedrail to push up to sitting; Mod assist to comletely elevate trunk to sitting and to use bed pads to square off hips at EOB    Transfers Overall transfer level: Needs assistance Equipment used: Rolling walker (2 wheels) Transfers: Sit to/from Stand, Bed to chair/wheelchair/BSC Sit to Stand: Mod assist            General transfer comment: modA to power up, verbal cues for hand placement    Ambulation/Gait Ambulation/Gait assistance: Mod assist, +2 safety/equipment (chair follow) Gait Distance (Feet): 10 Feet (x1, 20x1) Assistive device: Rolling walker (2 wheels) Gait Pattern/deviations: Step-to pattern, Decreased stride length, Trunk flexed Gait velocity: dec Gait velocity interpretation: <1.31 ft/sec, indicative of household ambulator   General Gait Details: modA to manage RW, significant trunk flexion, pt impulsively trying to sit without warning x2   Stairs             Wheelchair Mobility     Tilt Bed    Modified Rankin (Stroke Patients Only)       Balance Overall balance assessment: Needs assistance   Sitting balance-Leahy Scale: Fair Sitting balance - Comments: sits unsupported but with R lateral lean to offload LLE   Standing balance support: During functional activity, Bilateral upper extremity supported, Reliant on assistive device for balance Standing balance-Leahy Scale: Poor Standing balance comment: heavy reliance on external support                            Communication Communication Communication: Impaired Factors Affecting Communication: Hearing impaired;Reduced clarity of speech  Cognition Arousal: Alert Behavior During Therapy: WFL for tasks assessed/performed   PT - Cognitive impairments: No family/caregiver present to determine baseline  PT - Cognition Comments: pt with improved attentiveness today Following commands: Intact      Cueing Cueing Techniques: Verbal cues, Tactile cues  Exercises General Exercises - Lower Extremity Ankle Circles/Pumps: AROM, Both, 10 reps, Supine Quad Sets: AROM, Both, 10 reps, Supine Gluteal Sets: AROM, Both, 10 reps, Supine Long Arc Quad: AROM, Left, 10 reps, Seated Hip Flexion/Marching: AAROM, Left, 10 reps, Supine    General Comments General comments (skin  integrity, edema, etc.): VSS      Pertinent Vitals/Pain Pain Assessment Pain Assessment: Faces Faces Pain Scale: Hurts little more Pain Location: L hip Pain Descriptors / Indicators: Aching, Grimacing, Guarding    Home Living                          Prior Function            PT Goals (current goals can now be found in the care plan section) Acute Rehab PT Goals PT Goal Formulation: Patient unable to participate in goal setting Time For Goal Achievement: 01/17/24 Potential to Achieve Goals: Good Progress towards PT goals: Progressing toward goals    Frequency    Min 2X/week      PT Plan      Co-evaluation              AM-PAC PT 6 Clicks Mobility   Outcome Measure  Help needed turning from your back to your side while in a flat bed without using bedrails?: A Little Help needed moving from lying on your back to sitting on the side of a flat bed without using bedrails?: A Lot Help needed moving to and from a bed to a chair (including a wheelchair)?: A Lot Help needed standing up from a chair using your arms (e.g., wheelchair or bedside chair)?: A Lot Help needed to walk in hospital room?: A Lot Help needed climbing 3-5 steps with a railing? : Total 6 Click Score: 12    End of Session Equipment Utilized During Treatment: Gait belt Activity Tolerance: Patient tolerated treatment well Patient left: in chair;with call bell/phone within reach;with chair alarm set Nurse Communication: Mobility status PT Visit Diagnosis: Unsteadiness on feet (R26.81);History of falling (Z91.81);Pain Pain - Right/Left: Left Pain - part of body: Hip     Time: 1032-1056 PT Time Calculation (min) (ACUTE ONLY): 24 min  Charges:    $Gait Training: 8-22 mins $Therapeutic Exercise: 8-22 mins PT General Charges $$ ACUTE PT VISIT: 1 Visit                     Norene Ames, PT, DPT Acute Rehabilitation Services Secure chat preferred Office #: [phone]    Norene CHRISTELLA Ames 01/05/2024, 1:12 PM

## 2024-01-06 DIAGNOSIS — S72002A Fracture of unspecified part of neck of left femur, initial encounter for closed fracture: Secondary | ICD-10-CM | POA: Diagnosis not present

## 2024-01-06 LAB — GLUCOSE, CAPILLARY
Glucose-Capillary: 180 mg/dL — ABNORMAL HIGH (ref 70–99)
Glucose-Capillary: 169 mg/dL — ABNORMAL HIGH (ref 70–99)
Glucose-Capillary: 199 mg/dL — ABNORMAL HIGH (ref 70–99)
Glucose-Capillary: 270 mg/dL — ABNORMAL HIGH (ref 70–99)

## 2024-01-06 MED ORDER — SENNOSIDES-DOCUSATE SODIUM 8.6-50 MG PO TABS
1.0000 | ORAL_TABLET | Freq: Every day | ORAL | Status: DC
  Administered 2024-01-06 – 2024-01-07 (×2): 1 via ORAL
  Filled 2024-01-06 (×2): qty 1

## 2024-01-06 MED ORDER — POLYETHYLENE GLYCOL 3350 17 G PO PACK: 17.0000 g | PACK | Freq: Every day | ORAL | Status: DC | PRN

## 2024-01-06 MED ORDER — INSULIN GLARGINE-YFGN 100 UNIT/ML ~~LOC~~ SOLN
20.0000 [IU] | Freq: Every day | SUBCUTANEOUS | Status: DC
  Administered 2024-01-06: 20 [IU] via SUBCUTANEOUS
  Filled 2024-01-06 (×2): qty 0.2

## 2024-01-06 MED ORDER — ACETAMINOPHEN 500 MG PO TABS
1000.0000 mg | ORAL_TABLET | Freq: Three times a day (TID) | ORAL | Status: DC
  Administered 2024-01-06 – 2024-01-08 (×6): 1000 mg via ORAL
  Filled 2024-01-06 (×7): qty 2

## 2024-01-06 MED ORDER — TRAMADOL HCL 50 MG PO TABS
50.0000 mg | ORAL_TABLET | Freq: Four times a day (QID) | ORAL | Status: DC | PRN
  Administered 2024-01-07 – 2024-01-08 (×5): 50 mg via ORAL
  Filled 2024-01-06 (×5): qty 1

## 2024-01-06 NOTE — Progress Notes (Signed)
 Returned call to patient's son, Michael, call to give an update on the patient.

## 2024-01-06 NOTE — TOC Progression Note (Signed)
 Transition of Care [facility]) - Progression Note    Patient Details  Name: [name] MRN: [id number] Date of Birth: May 29, 1934  Transition of Care [facility]) CM/SW Contact  Dary Dilauro LITTIE Moose, [place] Phone Number: 01/06/2024, 12:58 PM  Clinical Narrative:    CSW received insurance auth approval for [name]. Auth ID [id number] effective 11/26-12/1/25. CSW confirmed with facility, Randine with [name] stated they will have a bed on Friday 11/28, pt can admit following hospital DC. CSW will continue to follow.   Expected Discharge Plan: Skilled Nursing Facility Barriers to Discharge: Insurance Authorization, SNF Pending bed offer, Continued Medical Work up               Expected Discharge Plan and Services       Living arrangements for the past 2 months: Single Family Home                                       Social Drivers of Health (SDOH) Interventions SDOH Screenings   Food Insecurity: No Food Insecurity (01/02/2024)  Housing: Low Risk  (01/02/2024)  Transportation Needs: No Transportation Needs (01/02/2024)  Utilities: Not At Risk (01/02/2024)  Alcohol Screen: Low Risk  (07/23/2023)  Depression (PHQ2-9): Medium Risk (10/23/2023)  Financial Resource Strain: Low Risk  (07/23/2023)  Physical Activity: Inactive (07/23/2023)  Social Connections: Moderately Isolated (01/02/2024)  Stress: No Stress Concern Present (07/23/2023)  Tobacco Use: Low Risk  (01/02/2024)  Health Literacy: Inadequate Health Literacy (07/23/2023)    Readmission Risk Interventions    11/24/2023    2:00 PM 04/17/2023   10:53 AM 04/10/2023    1:41 PM  Readmission Risk Prevention Plan  Transportation Screening Complete Complete Complete  PCP or Specialist Appt within 5-7 Days  Complete Complete  PCP or Specialist Appt within 3-5 Days Complete    Home Care Screening  Complete Complete  Medication Review (RN CM)  Complete Complete  HRI or Home Care Consult Complete    Social Work Consult for  Recovery Care Planning/Counseling Complete    Palliative Care Screening Not Applicable    Medication Review Oceanographer) Complete

## 2024-01-06 NOTE — Plan of Care (Signed)
  Problem: Activity: Goal: Risk for activity intolerance will decrease Outcome: Progressing   Problem: Coping: Goal: Level of anxiety will decrease Outcome: Progressing   Problem: Pain Managment: Goal: General experience of comfort will improve and/or be controlled Outcome: Progressing   Problem: Safety: Goal: Ability to remain free from injury will improve Outcome: Progressing

## 2024-01-06 NOTE — Progress Notes (Signed)
 Physical Therapy Treatment Patient Details Name: [name] MRN: [id number] DOB: Feb 23, 1934 Today's Date: 01/06/2024   History of Present Illness 89 y/o M presenting to ED on 11/21 after mechanical fall, found to have L periprosthetic femoral shaft fx around previous IMN, s/p IMN on 11/22.     PMH includes anxiety, CAD, HLD, DM2 on insulin , nephrolithaisis, BPH    PT Comments  Pt making gradual progress with transfers.  Does still need mod cues and min-mod A.  Ambulating 5' with RW -declined further due to fatigue and pain.  On 1 L O2 with VSS.  Good participation with exercises with cues for correct form.  Cont POC. Patient will benefit from continued inpatient follow up therapy, <3 hours/day at d/c    If plan is discharge home, recommend the following: Two people to help with walking and/or transfers;A lot of help with bathing/dressing/bathroom;Assistance with cooking/housework;Help with stairs or ramp for entrance   Can travel by private vehicle     No  Equipment Recommendations  None recommended by PT    Recommendations for Other Services       Precautions / Restrictions Precautions Precautions: Fall Restrictions LLE Weight Bearing Per Provider Order: Weight bearing as tolerated     Mobility  Bed Mobility Overal bed mobility: Needs Assistance Bed Mobility: Supine to Sit     Supine to sit: Mod assist     General bed mobility comments: Cues and min A for L LE to EOB then mod A to lift trunk; pt able to scoot to EOB wtih cues    Transfers Overall transfer level: Needs assistance Equipment used: Rolling walker (2 wheels) Transfers: Sit to/from Stand Sit to Stand: Min assist, From elevated surface           General transfer comment: min A but from elevated surface and cues for hand placement    Ambulation/Gait Ambulation/Gait assistance: Min assist Gait Distance (Feet): 5 Feet Assistive device: Rolling walker (2 wheels) Gait Pattern/deviations: Step-to  pattern, Decreased stride length, Trunk flexed Gait velocity: decreased     General Gait Details: Pt only ambulating 5 ' to chair, declined further.  Min A for balance and assist turning RW.  Cues for controlled descent wtih return to chair   Stairs             Wheelchair Mobility     Tilt Bed    Modified Rankin (Stroke Patients Only)       Balance Overall balance assessment: Needs assistance Sitting-balance support: No upper extremity supported Sitting balance-Leahy Scale: Good     Standing balance support: During functional activity, Bilateral upper extremity supported, Reliant on assistive device for balance Standing balance-Leahy Scale: Poor Standing balance comment: RW adn min A                            Communication Communication Communication: Impaired Factors Affecting Communication: Hearing impaired;Reduced clarity of speech  Cognition Arousal: Lethargic Behavior During Therapy: Flat affect (Few words during session)   PT - Cognitive impairments: No family/caregiver present to determine baseline                       PT - Cognition Comments: NUrsing reports lethargic most of the day.  Pt was arousable and able to maintain alertness during therapy. Following commands: Intact      Cueing Cueing Techniques: Verbal cues, Gestural cues  Exercises General Exercises - Lower Extremity Ankle  Circles/Pumps: AROM, Both, 10 reps, Supine Quad Sets: AROM, Both, 10 reps, Supine (max cues) Gluteal Sets: AROM, Both, 10 reps, Supine Long Arc Quad: AROM, Both, 10 reps, Seated Heel Slides: AAROM, Left, 10 reps, Supine    General Comments General comments (skin integrity, edema, etc.): Pt on 1 L O2 with sats >95%      Pertinent Vitals/Pain Pain Assessment Pain Assessment: Faces Faces Pain Scale: Hurts even more Pain Location: L hip w movement Pain Descriptors / Indicators: Grimacing Pain Intervention(s): Limited activity within patient's  tolerance, Monitored during session, Repositioned, Other (comment) (Pt had received tylenol  earlier, did not note any other pain meds but pt was lethargic)    Home Living                          Prior Function            PT Goals (current goals can now be found in the care plan section) Progress towards PT goals: Progressing toward goals    Frequency    Min 2X/week      PT Plan      Co-evaluation              AM-PAC PT 6 Clicks Mobility   Outcome Measure  Help needed turning from your back to your side while in a flat bed without using bedrails?: A Little Help needed moving from lying on your back to sitting on the side of a flat bed without using bedrails?: A Lot Help needed moving to and from a bed to a chair (including a wheelchair)?: A Lot Help needed standing up from a chair using your arms (e.g., wheelchair or bedside chair)?: A Lot Help needed to walk in hospital room?: Total (<20') Help needed climbing 3-5 steps with a railing? : Total 6 Click Score: 11    End of Session Equipment Utilized During Treatment: Gait belt Activity Tolerance: Patient tolerated treatment well Patient left: in chair;with call bell/phone within reach;with chair alarm set (dinner arrived - set up tray) Nurse Communication: Mobility status PT Visit Diagnosis: Unsteadiness on feet (R26.81);History of falling (Z91.81);Pain Pain - Right/Left: Left Pain - part of body: Hip     Time: 8358-8294 PT Time Calculation (min) (ACUTE ONLY): 24 min  Charges:    $Gait Training: 8-22 mins $Therapeutic Exercise: 8-22 mins PT General Charges $$ ACUTE PT VISIT: 1 Visit                     Benjiman, PT Acute Rehab [facility] Rehab [phone]    Benjiman VEAR Mulberry 01/06/2024, 5:17 PM

## 2024-01-06 NOTE — Progress Notes (Signed)
 PROGRESS NOTE  [name]  DOB: [birth year]  PCP: [name], MD FMW:[id number]  DOA: 01/01/2024  LOS: 4 days  Hospital Day: 6  Subjective: Patient was seen and examined this morning.  Pleasant elderly Caucasian male.  Lying down in bed.  Not in distress Chart reviewed. Afebrile, blood pressure elevated to 150s this morning Blood glucose level running elevated over 200 mostly  Brief narrative: [name] is a 89 y.o. male with PMH significant for DM2, HTN, HLD, CAD, diverticulosis, BPH, anxiety, renal mass 11/21, patient was brought to the ED from home after a mechanical fall at home.   X-ray on admission showed acute periprosthetic fracture in the proximal left femoral shaft with mild displacement Admitted to [facility] consulted 11/22, underwent ORIF Currently pending SNF   Assessment and plan: Closed left hip fracture Secondary to a mechanical fall at home Imaging and procedure as above PT recommended SNF.  Currently pending placement Per orthopedics Weightbearing as tolerated left lower extremity. Ortho recommended Eliquis  2.5 twice daily for 30 days for DVT prophylaxis. Bowel regimen with scheduled Senokot, as needed MiraLAX  Pain regimen --- Scheduled: Tylenol  1 g 3 times daily --- PRN: Tramadol ,   Hypokalemia Was low at 3.3 yesterday.  Replacement was given Recent Labs  Lab 01/01/24 2147 01/03/24 0612 01/05/24 0515  K 3.5 3.3* 3.3*  MG  --   --  1.7  PHOS  --   --  2.5   Chronic Combined systolic and diastolic heart failure HTN Volume status and blood pressure stable  PTA meds- Coreg  12.5 mg twice daily, Lasix  40 mg daily, losartan  25 mg daily, amlodipine  5 mg daily Currently requiring more medicines than normal but blood pressure still remains elevated Currently on Coreg  12.5 mg twice daily, Lasix  40 mg daily, losartan  25 mg daily, amlodipine  10 mg daily, hydralazine  50 mg 3 times daily Continue to monitor.  If blood pressure  continues to remain elevated, may need to increase doses  Type 2 diabetes mellitus uncontrolled with hyperglycemia A1c 8.8 on 09/29/2023 Currently on Semglee  16 units nightly, SSI/Accu-Cheks Blood sugar level consistently elevated over 200.  Increase Semglee  to 20 units for tonight Recent Labs  Lab 01/05/24 1133 01/05/24 1725 01/05/24 2115 01/06/24 0803 01/06/24 1206  GLUCAP 184* 235* 213* 180* 169*   CAD, HLD Continue aspirin  81 mg daily  LDL 103.  Not on statin.  BPH Flomax   Left kidney mass 10/10, CT abdomen done during last hospitalization showed 5 cm left kidney upper pole mass and recommended MRI.  Patient had outpatient MRI done by his PCP on 11/21, pending report.   Continue to follow-up with PCP    Goals of care   Code Status: Full Code     DVT prophylaxis:  SCDs Start: 01/02/24 1222 enoxaparin  (LOVENOX ) injection 40 mg Start: 01/02/24 1000 SCDs Start: 01/02/24 0110   Antimicrobials: None Fluid: None Consultants: Orthopedics Family Communication: None at bedside  Status: Inpatient Level of care:  Med-Surg   Patient is from: Home Needs to continue in-hospital care: Medically stable to discharge to SNF, pending insurance authorization    Diet:  Diet Order             Diet Carb Modified Fluid consistency: Thin; Room service appropriate? Yes  Diet effective now                   Scheduled Meds:  acetaminophen   1,000 mg Oral TID   amLODipine   10 mg Oral q  morning   aspirin  EC  81 mg Oral Daily   carvedilol   12.5 mg Oral BID WC   cyanocobalamin   1,000 mcg Oral Daily   enoxaparin  (LOVENOX ) injection  40 mg Subcutaneous Q24H   escitalopram   20 mg Oral Daily   furosemide   40 mg Oral Daily   hydrALAZINE   50 mg Oral TID   insulin  aspart  0-9 Units Subcutaneous TID WC   insulin  glargine-yfgn  20 Units Subcutaneous Q2200   losartan   25 mg Oral Daily   senna-docusate  1 tablet Oral QHS   tamsulosin   0.4 mg Oral QPC supper    PRN  meds: diphenhydrAMINE , meclizine , methocarbamol  **OR** methocarbamol  (ROBAXIN ) injection, metoCLOPramide  **OR** metoCLOPramide  (REGLAN ) injection, ondansetron  **OR** ondansetron  (ZOFRAN ) IV, mouth rinse, polyethylene glycol, traMADol    Infusions:    Antimicrobials: Anti-infectives (From admission, onward)    Start     Dose/Rate Route Frequency Ordered Stop   01/02/24 1400  ceFAZolin  (ANCEF ) IVPB 2g/100 mL premix        2 g 200 mL/hr over 30 Minutes Intravenous Every 8 hours 01/02/24 1221 01/03/24 0648   01/02/24 0948  vancomycin  (VANCOCIN ) powder  Status:  Discontinued          As needed 01/02/24 0948 01/02/24 1057   01/02/24 0845  ceFAZolin  (ANCEF ) IVPB 2g/100 mL premix        2 g 200 mL/hr over 30 Minutes Intravenous  Once 01/02/24 0844 01/02/24 0940       Objective: Vitals:   01/06/24 0939 01/06/24 1026  BP: (!) 176/95 (!) 159/78  Pulse: 80 75  Resp: 18   Temp: 98.1 F (36.7 C)   SpO2: 94%     Intake/Output Summary (Last 24 hours) at 01/06/2024 1511 Last data filed at 01/06/2024 0426 Gross per 24 hour  Intake 360 ml  Output 1700 ml  Net -1340 ml   Filed Weights   01/02/24 0115  Weight: 94.3 kg   Weight change:  Body mass index is 29 kg/m.   Physical Exam: General exam: Pleasant, elderly Caucasian male Skin: No rashes, lesions or ulcers. HEENT: Atraumatic, normocephalic, no obvious bleeding Lungs: Clear to auscultation bilaterally,  CVS: S1, S2, no murmur,   GI/Abd: Soft, nontender, nondistended, bowel sound present,   CNS: Alert, awake, oriented x 3 Psychiatry: Mood appropriate Extremities: No pedal edema, no calf tenderness,   Data Review: I have personally reviewed the laboratory data and studies available.  F/u labs ordered Unresulted Labs (From admission, onward)     Start     Ordered   01/09/24 0500  Creatinine, serum  (enoxaparin  (LOVENOX )    CrCl >/= 30 ml/min)  Weekly,   R     Comments: while on enoxaparin  therapy    01/02/24 0112    01/07/24 0500  Basic metabolic panel with GFR  Tomorrow morning,   R        01/06/24 1510   01/07/24 0500  CBC with Differential/Platelet  Tomorrow morning,   R        01/06/24 1510            Signed, Chapman Rota, MD [facility] 01/06/2024

## 2024-01-07 DIAGNOSIS — S72002A Fracture of unspecified part of neck of left femur, initial encounter for closed fracture: Secondary | ICD-10-CM | POA: Diagnosis not present

## 2024-01-07 LAB — CBC WITH DIFFERENTIAL/PLATELET
Abs Immature Granulocytes: 0.05 K/uL (ref 0.00–0.07)
Basophils Absolute: 0 K/uL (ref 0.0–0.1)
Basophils Relative: 1 %
Eosinophils Absolute: 0.4 K/uL (ref 0.0–0.5)
Eosinophils Relative: 4 %
HCT: 34.5 % — ABNORMAL LOW (ref 39.0–52.0)
Hemoglobin: 11.4 g/dL — ABNORMAL LOW (ref 13.0–17.0)
Immature Granulocytes: 1 %
Lymphocytes Relative: 27 %
Lymphs Abs: 2.3 K/uL (ref 0.7–4.0)
MCH: 30.9 pg (ref 26.0–34.0)
MCHC: 33 g/dL (ref 30.0–36.0)
MCV: 93.5 fL (ref 80.0–100.0)
Monocytes Absolute: 0.7 K/uL (ref 0.1–1.0)
Monocytes Relative: 9 %
Neutro Abs: 5 K/uL (ref 1.7–7.7)
Neutrophils Relative %: 58 %
Platelets: 364 K/uL (ref 150–400)
RBC: 3.69 MIL/uL — ABNORMAL LOW (ref 4.22–5.81)
RDW: 14 % (ref 11.5–15.5)
WBC: 8.5 K/uL (ref 4.0–10.5)
nRBC: 0 % (ref 0.0–0.2)

## 2024-01-07 LAB — BASIC METABOLIC PANEL WITH GFR
Anion gap: 10 (ref 5–15)
BUN: 20 mg/dL (ref 8–23)
CO2: 27 mmol/L (ref 22–32)
Calcium: 9.1 mg/dL (ref 8.9–10.3)
Chloride: 99 mmol/L (ref 98–111)
Creatinine, Ser: 1.18 mg/dL (ref 0.61–1.24)
GFR, Estimated: 60 mL/min — ABNORMAL LOW (ref >60)
Glucose, Bld: 263 mg/dL — ABNORMAL HIGH (ref 70–99)
Potassium: 4 mmol/L (ref 3.5–5.1)
Sodium: 136 mmol/L (ref 135–145)

## 2024-01-07 LAB — GLUCOSE, CAPILLARY
Glucose-Capillary: 255 mg/dL — ABNORMAL HIGH (ref 70–99)
Glucose-Capillary: 175 mg/dL — ABNORMAL HIGH (ref 70–99)
Glucose-Capillary: 261 mg/dL — ABNORMAL HIGH (ref 70–99)
Glucose-Capillary: 185 mg/dL — ABNORMAL HIGH (ref 70–99)

## 2024-01-07 MED ORDER — HYDRALAZINE HCL 50 MG PO TABS
50.0000 mg | ORAL_TABLET | Freq: Three times a day (TID) | ORAL | Status: DC
  Administered 2024-01-07 – 2024-01-08 (×3): 50 mg via ORAL
  Filled 2024-01-07 (×3): qty 1

## 2024-01-07 MED ORDER — HYDRALAZINE HCL 50 MG PO TABS: 75.0000 mg | ORAL_TABLET | Freq: Three times a day (TID) | ORAL | Status: DC

## 2024-01-07 MED ORDER — LOSARTAN POTASSIUM 50 MG PO TABS
50.0000 mg | ORAL_TABLET | Freq: Every day | ORAL | Status: DC
  Administered 2024-01-08: 50 mg via ORAL
  Filled 2024-01-07: qty 1

## 2024-01-07 MED ORDER — EMPAGLIFLOZIN 10 MG PO TABS: 10.0000 mg | ORAL_TABLET | Freq: Every day | ORAL | Status: DC

## 2024-01-07 MED ORDER — INSULIN GLARGINE-YFGN 100 UNIT/ML ~~LOC~~ SOLN
25.0000 [IU] | Freq: Every day | SUBCUTANEOUS | Status: DC
  Administered 2024-01-07: 25 [IU] via SUBCUTANEOUS
  Filled 2024-01-07 (×2): qty 0.25

## 2024-01-07 MED ORDER — EMPAGLIFLOZIN 10 MG PO TABS
10.0000 mg | ORAL_TABLET | Freq: Every day | ORAL | Status: DC
  Administered 2024-01-07 – 2024-01-08 (×2): 10 mg via ORAL
  Filled 2024-01-07 (×2): qty 1

## 2024-01-07 NOTE — Plan of Care (Signed)
   Problem: Education: Goal: Ability to describe self-care measures that may prevent or decrease complications (Diabetes Survival Skills Education) will improve Outcome: Progressing   Problem: Fluid Volume: Goal: Ability to maintain a balanced intake and output will improve Outcome: Progressing   Problem: Nutritional: Goal: Maintenance of adequate nutrition will improve Outcome: Progressing   Problem: Skin Integrity: Goal: Risk for impaired skin integrity will decrease Outcome: Progressing

## 2024-01-07 NOTE — Plan of Care (Signed)
   Problem: Safety: Goal: Ability to remain free from injury will improve Outcome: Progressing   Problem: Skin Integrity: Goal: Risk for impaired skin integrity will decrease Outcome: Progressing

## 2024-01-07 NOTE — Progress Notes (Signed)
 PROGRESS NOTE  [name]  DOB: 04-24-1934  PCP: [name], MD FMW:[id number]  DOA: 01/01/2024  LOS: 5 days  Hospital Day: 7  Subjective: Patient was seen and examined this morning. Lying on bed.  Not in distress. Afebrile, heart rate in 70s, blood pressure in 180s prior to meds Labs this morning with WC count 8.5, hemoglobin 11.4, blood glucose level low at 250  Brief narrative: [name] is a 89 y.o. male with PMH significant for DM2, HTN, HLD, CAD, diverticulosis, BPH, anxiety, renal mass 11/21, patient was brought to the ED from home after a mechanical fall at home.   X-ray on admission showed acute periprosthetic fracture in the proximal left femoral shaft with mild displacement Admitted to [facility] Orthopedics consulted 11/22, underwent ORIF Currently pending SNF, facility able to accept on Friday 11/28   Assessment and plan: Closed left hip fracture Secondary to a mechanical fall at home Imaging and procedure as above PT recommended SNF.  Currently pending placement Per orthopedics Weightbearing as tolerated left lower extremity. Ortho recommended Eliquis  2.5 twice daily for 30 days for DVT prophylaxis. Bowel regimen with scheduled Senokot, as needed MiraLAX  Pain regimen --- Scheduled: Tylenol  1 g 3 times daily --- PRN: Tramadol ,   Chronic Combined systolic and diastolic heart failure HTN Volume status and blood pressure stable  PTA meds- Coreg  12.5 mg twice daily, Lasix  40 mg daily, losartan  25 mg daily, Jardiance  10 mg daily, amlodipine  5 mg daily Currently requiring more medicines than normal but blood pressure still remains elevated Currently on Coreg  12.5 mg twice daily, Lasix  40 mg daily, losartan  25 mg daily, amlodipine  10 mg daily, hydralazine  50 mg 3 times daily Blood pressure continues to remain elevated, I will increase the dose of losartan  to 50 mg daily and resume Jardiance  10 mg daily  Type 2 diabetes mellitus uncontrolled with  hyperglycemia A1c 8.8 on 09/29/2023 PTA meds-Lantus , Premeal insulin  Jardiance  10 mg daily Currently on Semglee  20 units nightly, SSI/Accu-Cheks Blood sugar level consistently elevated over 200.  Increase Semglee  to 25 units for tonight.  Jardiance  has been resumed as well Recent Labs  Lab 01/06/24 0803 01/06/24 1206 01/06/24 1657 01/06/24 2055 01/07/24 0915  GLUCAP 180* 169* 199* 270* 255*   CAD, HLD Continue aspirin  81 mg daily  LDL 103.  Not on statin.  BPH Flomax   Left kidney mass 10/10, CT abdomen done during last hospitalization showed 5 cm left kidney upper pole mass and recommended MRI.  Patient had outpatient MRI done by his PCP on 11/21, pending report.   Continue to follow-up with PCP    Goals of care   Code Status: Full Code     DVT prophylaxis:  SCDs Start: 01/02/24 1222 enoxaparin  (LOVENOX ) injection 40 mg Start: 01/02/24 1000 SCDs Start: 01/02/24 0110   Antimicrobials: None Fluid: None Consultants: Orthopedics Family Communication: None at bedside  Status: Inpatient Level of care:  Med-Surg   Patient is from: Home Needs to continue in-hospital care: Medically stable to discharge to SNF.  Per case worker, facility will be able to take him tomorrow    Diet:  Diet Order             Diet Carb Modified Fluid consistency: Thin; Room service appropriate? Yes  Diet effective now                   Scheduled Meds:  acetaminophen   1,000 mg Oral TID   amLODipine   10 mg Oral q morning  aspirin  EC  81 mg Oral Daily   carvedilol   12.5 mg Oral BID WC   cyanocobalamin   1,000 mcg Oral Daily   empagliflozin   10 mg Oral QAC breakfast   enoxaparin  (LOVENOX ) injection  40 mg Subcutaneous Q24H   escitalopram   20 mg Oral Daily   furosemide   40 mg Oral Daily   hydrALAZINE   50 mg Oral TID   insulin  aspart  0-9 Units Subcutaneous TID WC   insulin  glargine-yfgn  25 Units Subcutaneous Q2200   [START ON 01/08/2024] losartan   50 mg Oral Daily    senna-docusate  1 tablet Oral QHS   tamsulosin   0.4 mg Oral QPC supper    PRN meds: diphenhydrAMINE , meclizine , methocarbamol  **OR** methocarbamol  (ROBAXIN ) injection, metoCLOPramide  **OR** metoCLOPramide  (REGLAN ) injection, ondansetron  **OR** ondansetron  (ZOFRAN ) IV, mouth rinse, polyethylene glycol, traMADol    Infusions:    Antimicrobials: Anti-infectives (From admission, onward)    Start     Dose/Rate Route Frequency Ordered Stop   01/02/24 1400  ceFAZolin  (ANCEF ) IVPB 2g/100 mL premix        2 g 200 mL/hr over 30 Minutes Intravenous Every 8 hours 01/02/24 1221 01/03/24 0648   01/02/24 0948  vancomycin  (VANCOCIN ) powder  Status:  Discontinued          As needed 01/02/24 0948 01/02/24 1057   01/02/24 0845  ceFAZolin  (ANCEF ) IVPB 2g/100 mL premix        2 g 200 mL/hr over 30 Minutes Intravenous  Once 01/02/24 0844 01/02/24 0940       Objective: Vitals:   01/07/24 0415 01/07/24 0910  BP: (!) 187/85 (!) 178/75  Pulse: 79 79  Resp: 18   Temp: (!) 97.4 F (36.3 C)   SpO2: 97%     Intake/Output Summary (Last 24 hours) at 01/07/2024 1100 Last data filed at 01/07/2024 1000 Gross per 24 hour  Intake --  Output 750 ml  Net -750 ml   Filed Weights   01/02/24 0115  Weight: 94.3 kg   Weight change:  Body mass index is 29 kg/m.   Physical Exam: General exam: Pleasant, elderly Caucasian male. Skin: No rashes, lesions or ulcers. HEENT: Atraumatic, normocephalic, no obvious bleeding Lungs: Clear to auscultation bilaterally,  CVS: S1, S2, no murmur,   GI/Abd: Soft, nontender, nondistended, bowel sound present,   CNS: Alert, awake, oriented x 3 Psychiatry: Mood appropriate Extremities: No pedal edema, no calf tenderness,   Data Review: I have personally reviewed the laboratory data and studies available.  F/u labs ordered Unresulted Labs (From admission, onward)     Start     Ordered   01/09/24 0500  Creatinine, serum  (enoxaparin  (LOVENOX )    CrCl >/= 30 ml/min)   Weekly,   R     Comments: while on enoxaparin  therapy    01/02/24 0112            Signed, Chapman Rota, MD [facility] 01/07/2024

## 2024-01-08 DIAGNOSIS — S72002A Fracture of unspecified part of neck of left femur, initial encounter for closed fracture: Secondary | ICD-10-CM | POA: Diagnosis not present

## 2024-01-08 LAB — GLUCOSE, CAPILLARY: Glucose-Capillary: 164 mg/dL — ABNORMAL HIGH (ref 70–99)

## 2024-01-08 MED ORDER — POLYETHYLENE GLYCOL 3350 17 G PO PACK: 17.0000 g | PACK | Freq: Every day | ORAL | Status: AC | PRN

## 2024-01-08 MED ORDER — AMLODIPINE BESYLATE 10 MG PO TABS: 10.0000 mg | ORAL_TABLET | Freq: Every morning | ORAL | Status: AC

## 2024-01-08 MED ORDER — ACETAMINOPHEN 500 MG PO TABS: 1000.0000 mg | ORAL_TABLET | Freq: Three times a day (TID) | ORAL | Status: DC

## 2024-01-08 MED ORDER — INSULIN ASPART 100 UNIT/ML IJ SOLN: 0.0000 [IU] | Freq: Three times a day (TID) | INTRAMUSCULAR | Status: DC

## 2024-01-08 MED ORDER — TAMSULOSIN HCL 0.4 MG PO CAPS: 0.4000 mg | ORAL_CAPSULE | Freq: Every day | ORAL | Status: AC

## 2024-01-08 MED ORDER — LOSARTAN POTASSIUM 50 MG PO TABS: 50.0000 mg | ORAL_TABLET | Freq: Every day | ORAL | Status: DC

## 2024-01-08 MED ORDER — INSULIN GLARGINE-YFGN 100 UNIT/ML ~~LOC~~ SOLN: 30.0000 [IU] | Freq: Every day | SUBCUTANEOUS | Status: DC

## 2024-01-08 MED ORDER — INSULIN GLARGINE-YFGN 100 UNIT/ML ~~LOC~~ SOLN
30.0000 [IU] | Freq: Every day | SUBCUTANEOUS | Status: DC
  Filled 2024-01-08: qty 0.3

## 2024-01-08 MED ORDER — SENNOSIDES-DOCUSATE SODIUM 8.6-50 MG PO TABS: 1.0000 | ORAL_TABLET | Freq: Two times a day (BID) | ORAL | Status: DC

## 2024-01-08 NOTE — Progress Notes (Signed)
 Discharge Summary: DC order noted per MD. DC RN at bedside. Med details completed. AVS printed, set in discharge packet with med necessity/prescription with patient chart pending transport to [name].

## 2024-01-08 NOTE — TOC Transition Note (Signed)
 Transition of Care [facility]) - Discharge Note   Patient Details  Name: [name] MRN: [id number] Date of Birth: 01/03/1935  [facility] CM/SW Contact:  Jeoffrey LITTIE Maranda ISRAEL Phone Number: 01/08/2024, 10:55 AM   Clinical Narrative:    Patient will DC to: [name] Anticipated DC date: 01/08/24 Family notified: Yes Transport by: [facility]   Per MD patient ready for DC to [name]. RN to call report prior to discharge [phone]. RN, patient, patient's family, and facility notified of DC. Discharge Summary and FL2 sent to facility. DC packet on chart. Ambulance transport requested for patient.   CSW will sign off for now as social work intervention is no longer needed. Please consult us  again if new needs arise.     Final next level of care: Skilled Nursing Facility Barriers to Discharge: Barriers Resolved   Patient Goals and CMS Choice Patient states their goals for this hospitalization and ongoing recovery are:: SNF          Discharge Placement   Existing PASRR number confirmed : 01/08/24          Patient chooses bed at: [facility] [place] Patient to be transferred to facility by: [facility] Name of family member notified: [name] Patient and family notified of of transfer: 01/08/24  Discharge Plan and Services Additional resources added to the After Visit Summary for                                       Social Drivers of Health (SDOH) Interventions SDOH Screenings   Food Insecurity: No Food Insecurity (01/02/2024)  Housing: Low Risk  (01/02/2024)  Transportation Needs: No Transportation Needs (01/02/2024)  Utilities: Not At Risk (01/02/2024)  Alcohol Screen: Low Risk  (07/23/2023)  Depression (PHQ2-9): Medium Risk (10/23/2023)  Financial Resource Strain: Low Risk  (07/23/2023)  Physical Activity: Inactive (07/23/2023)  Social Connections: Moderately Isolated (01/02/2024)  Stress: No Stress Concern Present (07/23/2023)  Tobacco  Use: Low Risk  (01/02/2024)  Health Literacy: Inadequate Health Literacy (07/23/2023)     Readmission Risk Interventions    11/24/2023    2:00 PM 04/17/2023   10:53 AM 04/10/2023    1:41 PM  Readmission Risk Prevention Plan  Transportation Screening Complete Complete Complete  PCP or Specialist Appt within 5-7 Days  Complete Complete  PCP or Specialist Appt within 3-5 Days Complete    Home Care Screening  Complete Complete  Medication Review (RN CM)  Complete Complete  HRI or Home Care Consult Complete    Social Work Consult for Recovery Care Planning/Counseling Complete    Palliative Care Screening Not Applicable    Medication Review [employer]) Complete

## 2024-01-08 NOTE — Discharge Summary (Addendum)
 Physician Discharge Summary  [name] FMW:[id number] DOB: August 22, 1934 DOA: 01/01/2024  PCP: [name], MD  Admit date: 01/01/2024 Discharge date: 01/08/2024  Admitted from: Home Discharge disposition: SNF  Recommendations at discharge:  Per orthopedics -Weightbearing as tolerated left lower extremity. -Eliquis  2.5 twice daily for 30 days for DVT prophylaxis. Keep aspirin  on hold while on Eliquis  for DVT prophylaxis.  Resume afterwards. Follow-up with PCP, urologist for MRI abdomen findings.  New referral sent.  Subjective: Patient was seen and examined this morning. Lying on bed.  Not in distress.  No new symptoms.  Family not at bedside.  I called and spoke with his son [name] on the phone. Afebrile, blood pressure better this morning 130s Blood sugar level better in the 160s  Brief narrative: [name] is a 89 y.o. male with PMH significant for DM2, HTN, HLD, CAD, diverticulosis, BPH, anxiety, renal mass 11/21, patient was brought to the ED from home after a mechanical fall at home.   X-ray on admission showed acute periprosthetic fracture in the proximal left femoral shaft with mild displacement Admitted to [facility] Orthopedics consulted 11/22, underwent ORIF Currently pending SNF, facility able to accept on Friday 11/28   Hospital course: Closed left hip fracture Secondary to a mechanical fall at home Imaging and procedure as above PT recommended SNF.   Bowel regimen with scheduled Senokot, as needed MiraLAX  Pain control with Tylenol  1 g 3 times daily and as needed tramadol   Per orthopedics -Weightbearing as tolerated left lower extremity. -Eliquis  2.5 twice daily for 30 days for DVT prophylaxis.  Chronic Combined systolic and diastolic heart failure HTN Volume status and blood pressure stable  PTA meds-on multiple blood pressure medicines including Coreg , Lasix , losartan , Jardiance , amlodipine , hydralazine  Currently blood pressure  and heart rate are controlled on Coreg  12.5 mg twice daily, Lasix  40 mg daily, losartan  50 mg daily, amlodipine  10 mg daily, hydralazine  50 mg 3 times daily and Jardiance  10 mg daily. Continue this regimen postdischarge  Type 2 diabetes mellitus uncontrolled with hyperglycemia A1c 8.8 on 09/29/2023 PTA meds-Lantus , Premeal insulin  Jardiance   Currently on Semglee  nightly, SSI/Accu-Cheks Blood sugar level improving but still remains elevated over 150s consistently.   Based on blood sugar response, will discharge him on Semglee  30 units, Jardiance  10 mg daily and SSI with Accu-Cheks Recent Labs  Lab 01/07/24 0915 01/07/24 1149 01/07/24 1714 01/07/24 2030 01/08/24 0700  GLUCAP 255* 175* 261* 185* 164*   CAD, HLD PTA meds- aspirin  81 mg daily. keep aspirin  on hold while on Eliquis  for DVT prophylaxis LDL 103.  Not on statin??  BPH Flomax   Left kidney mass 10/10, CT abdomen done during last hospitalization showed 5 cm left kidney upper pole mass and recommended MRI.  Prior to the fall and hospitalization, he had MRI abdomen done on 11/21 as a follow-up of renal mass. This morning, while doing the discharge preparation, I noticed that the result is in this morning and is suspicious for RCC. On chart review, I noticed that Dr. Cleatus had sent a urology referral but it seems the patient refused and it is closed now.   I have discussed this with patient's son [name] who asked me to send a referral again.  He wanted me to not reveal the MRI findings to his dad and hence patient is not aware of the result. I have sent a request for another referral.  I have also sent a secure message to his PCP Dr.  Cleatus and urologist Dr. Renda.    Goals of care   Code Status: Full Code   Diet:  Diet Order             Diet Carb Modified           Diet Carb Modified Fluid consistency: Thin; Room service appropriate? Yes  Diet effective now                   Nutritional status:   Body mass index is 29 kg/m.       Wounds:  - Wound 11/17/23 1321 Surgical Closed Surgical Incision Hip Left (Active)  Date First Assessed/Time First Assessed: 11/17/23 1321   Primary Wound Type: Surgical  Secondary Wound Type - Surgical: Closed Surgical Incision  Location: Hip  Location Orientation: Left    Assessments 11/17/2023  1:43 PM 01/08/2024  8:04 AM  Site / Wound Assessment -- Dressing in place / Unable to assess  Dressing Type Gauze (Comment) --  Dressing Status Clean, Dry, Intact Clean, Dry, Intact     No associated orders.     Wound 01/02/24 0200 Pressure Injury Buttocks Left Stage 2 -  Partial thickness loss of dermis presenting as a shallow open injury with a red, pink wound bed without slough. (Active)  Date First Assessed/Time First Assessed: 01/02/24 0200   Present on Original Admission: Yes  Primary Wound Type: Pressure Injury  Location: Buttocks  Location Orientation: Left  Staging: Stage 2 -  Partial thickness loss of dermis presenting as a shal...    Assessments 01/02/2024  2:30 AM 01/07/2024  1:00 PM  Wound Image     Site / Wound Assessment Clean;Dry;Red Clean;Dry;Red  Peri-wound Assessment Intact;Erythema (blanchable) Intact;Erythema (blanchable)  Wound Length (cm) 0.3 cm --  Wound Width (cm) 0.5 cm --  Wound Surface Area (cm^2) 0.12 cm^2 --  Wound Depth (cm) 0 cm --  Wound Volume (cm^3) 0 cm^3 --  Drainage Description No odor No odor  Drainage Amount None None  Treatment Cleansed Cleansed  Dressing Type Foam - Lift dressing to assess site every shift Foam - Lift dressing to assess site every shift  Dressing Changed New Changed  Dressing Status Clean, Dry, Intact Clean, Dry, Intact     No associated orders.    Discharge Medications:   Allergies as of 01/08/2024       Reactions   Morphine  Nausea And Vomiting, Nausea Only   Can take with food and usually doesn't cause nausea   Atorvastatin     Intolerant - nausea/myalgias   Codeine Phosphate Nausea  And Vomiting   Metformin  And Related Diarrhea        Medication List     STOP taking these medications    aspirin  EC 81 MG tablet   insulin  lispro 100 UNIT/ML KwikPen Commonly known as: HumaLOG  KwikPen   meclizine  12.5 MG tablet Commonly known as: ANTIVERT    potassium chloride  SA 20 MEQ tablet Commonly known as: KLOR-CON  M   [name]  SoloStar 300 UNIT/ML Solostar Pen Generic drug: insulin  glargine (1 Unit Dial )       TAKE these medications    Accu-Chek Aviva Soln Use as directed monthly. E11.59  Insulin  dependent   Accu-Chek Softclix Lancets lancets TEST BLOOD SUGAR UP TO THREE TIMES DAILY   acetaminophen  500 MG tablet Commonly known as: TYLENOL  Take 2 tablets (1,000 mg total) by mouth 3 (three) times daily.   amLODipine  10 MG tablet Commonly known as: NORVASC  Take 1 tablet (10 mg total)  by mouth every morning. What changed:  medication strength how much to take   apixaban  2.5 MG Tabs tablet Commonly known as: Eliquis  Take 1 tablet (2.5 mg total) by mouth 2 (two) times daily.   carvedilol  12.5 MG tablet Commonly known as: COREG  TAKE 1 TABLET BY MOUTH TWICE  DAILY WITH MEALS   COD LIVER OIL PO Take 1 capsule by mouth 2 (two) times daily.   Contour Blood Glucose System w/Device Kit 1 each by Does not apply route 3 (three) times daily.   Contour Test test strip Generic drug: glucose blood Use as instructed   cyanocobalamin  1000 MCG tablet Commonly known as: VITAMIN B12 Take 1 tablet (1,000 mcg total) by mouth daily.   diclofenac  Sodium 1 % Gel Commonly known as: VOLTAREN  Apply 2 g topically 4 (four) times daily.   Droplet Pen Needles 30G X 8 MM Misc Generic drug: Insulin  Pen Needle USE  WITH  INSULIN   PEN   empagliflozin  10 MG Tabs tablet Commonly known as: Jardiance  Take 1 tablet (10 mg total) by mouth daily before breakfast. Bill secondary: BIN [id number], PCN [id number], GRP [id number], ID [id number]   escitalopram  20 MG tablet Commonly known as:  Lexapro  Take 1 tablet (20 mg total) by mouth daily.   furosemide  40 MG tablet Commonly known as: LASIX  Take 1 tablet (40 mg total) by mouth daily. Can take an additional 20mg  as needed for weight gain of 2 lbs in a day or 5lbs in a week   hydrALAZINE  50 MG tablet Commonly known as: APRESOLINE  TAKE 1 TABLET BY MOUTH 3 TIMES  DAILY   insulin  aspart 100 UNIT/ML injection Commonly known as: novoLOG  Inject 0-9 Units into the skin 3 (three) times daily with meals.   insulin  glargine-yfgn 100 UNIT/ML injection Commonly known as: SEMGLEE  Inject 0.3 mLs (30 Units total) into the skin daily at 10 pm.   losartan  50 MG tablet Commonly known as: COZAAR  Take 1 tablet (50 mg total) by mouth daily. What changed:  medication strength how much to take   methocarbamol  500 MG tablet Commonly known as: ROBAXIN  Take 0.5 tablets (250 mg total) by mouth 4 (four) times daily.   polyethylene glycol 17 g packet Commonly known as: MIRALAX  / GLYCOLAX  Take 17 g by mouth daily as needed for moderate constipation.   senna-docusate 8.6-50 MG tablet Commonly known as: Senokot-S Take 1 tablet by mouth 2 (two) times daily. What changed:  how much to take when to take this   tamsulosin  0.4 MG Caps capsule Commonly known as: FLOMAX  Take 1 capsule (0.4 mg total) by mouth daily after supper. What changed: See the new instructions.   traMADol  50 MG tablet Commonly known as: ULTRAM  Take 1-2 tablets (50-100 mg total) by mouth every 6 (six) hours as needed for moderate pain (pain score 4-6) or severe pain (pain score 7-10).               Discharge Care Instructions  (From admission, onward)           Start     Ordered   01/08/24 0000  Discharge wound care:        01/08/24 1009             Follow ups:    Contact information for follow-up providers     Renda Glance, MD Follow up.   Specialty: Urology Contact information: [address] [place] [address] [phone]          Cleatus Molly  S, MD Follow up.   Specialty: Family Medicine Contact information: [address] [place] [address] [phone]              Contact information for after-discharge care     Destination     [name], [place] .   Service: Skilled Nursing Contact information: [address] [place]  [phone]                     Discharge Instructions:   Discharge Instructions     Ambulatory referral to Urology   Complete by: As directed    Call MD for:  difficulty breathing, headache or visual disturbances   Complete by: As directed    Call MD for:  extreme fatigue   Complete by: As directed    Call MD for:  hives   Complete by: As directed    Call MD for:  persistant dizziness or light-headedness   Complete by: As directed    Call MD for:  persistant nausea and vomiting   Complete by: As directed    Call MD for:  severe uncontrolled pain   Complete by: As directed    Call MD for:  temperature >100.4   Complete by: As directed    Diet Carb Modified   Complete by: As directed    Discharge instructions   Complete by: As directed    Recommendations at discharge:   Per orthopedics -Weightbearing as tolerated left lower extremity. -Eliquis  2.5 twice daily for 30 days for DVT prophylaxis.  Keep aspirin  on hold while on Eliquis  for DVT prophylaxis.  Resume afterwards.  Follow-up with PCP, urologist for MRI abdomen findings.  New referral sent.  Discharge instructions for diabetes mellitus: Check blood sugar 3 times a day and bedtime at home. If blood sugar running above 200 or less than 70 please call your MD to adjust insulin . If you notice signs and symptoms of hypoglycemia (low blood sugar) like jitteriness, confusion, thirst, tremor and sweating, please check blood sugar, drink sugary drink/biscuits/sweets to increase sugar level and call MD or return to ER.      PDMP reviewed  this encounter.   Opioid taper instructions: It is important to wean off of your opioid medication as soon as possible. If you do not need pain medication after your surgery it is ok to stop day one. Opioids include: Codeine, Hydrocodone (Norco, Vicodin), Oxycodone (Percocet, oxycontin ) and hydromorphone  amongst others.  Long term and even short term use of opiods can cause: Increased pain response Dependence Constipation Depression Respiratory depression And more.  Withdrawal symptoms can include Flu like symptoms Nausea, vomiting And more Techniques to manage these symptoms Hydrate well Eat regular healthy meals Stay active Use relaxation techniques(deep breathing, meditating, yoga) Do Not substitute Alcohol to help with tapering If you have been on opioids for less than two weeks and do not have pain than it is ok to stop all together.  Plan to wean off of opioids This plan should start within one week post op of your joint replacement. Maintain the same interval or time between taking each dose and first decrease the dose.  Cut the total daily intake of opioids by one tablet each day Next start to increase the time between doses. The last dose that should be eliminated is the evening dose.        General discharge instructions: Follow with Primary MD [name], MD in 7 days  Please request  your PCP  to go over your hospital tests, procedures, radiology results at the follow up. Please get your medicines reviewed and adjusted.  Your PCP may decide to repeat certain labs or tests as needed. Do not drive, operate heavy machinery, perform activities at heights, swimming or participation in water  activities or provide baby sitting services if your were admitted for syncope or siezures until you have seen by Primary MD or a Neurologist and advised to do so again. [place]  Controlled Substance Reporting System database was reviewed. Do not drive, operate heavy  machinery, perform activities at heights, swim, participate in water  activities or provide baby-sitting services while on medications for pain, sleep and mood until your outpatient physician has reevaluated you and advised to do so again.  You are strongly recommended to comply with the dose, frequency and duration of prescribed medications. Activity: As tolerated with Full fall precautions use walker/cane & assistance as needed Avoid using any recreational substances like cigarette, tobacco, alcohol, or non-prescribed drug. If you experience worsening of your admission symptoms, develop shortness of breath, life threatening emergency, suicidal or homicidal thoughts you must seek medical attention immediately by calling 911 or calling your MD immediately  if symptoms less severe. You must read complete instructions/literature along with all the possible adverse reactions/side effects for all the medicines you take and that have been prescribed to you. Take any new medicine only after you have completely understood and accepted all the possible adverse reactions/side effects.  Wear Seat belts while driving. You were cared for by a hospitalist during your hospital stay. If you have any questions about your discharge medications or the care you received while you were in the hospital after you are discharged, you can call the unit and ask to speak with the hospitalist or the covering physician. Once you are discharged, your primary care physician will handle any further medical issues. Please note that NO REFILLS for any discharge medications will be authorized once you are discharged, as it is imperative that you return to your primary care physician (or establish a relationship with a primary care physician if you do not have one).   Discharge wound care:   Complete by: As directed    Increase activity slowly   Complete by: As directed        Discharge Exam:   Vitals:   01/07/24 1727 01/07/24 2032  01/08/24 0600 01/08/24 0901  BP: (!) 166/79 (!) 147/69 133/65 (!) 151/74  Pulse: 73 65 75 87  Resp:   16 18  Temp:  98.2 F (36.8 C) 98.2 F (36.8 C) 98.2 F (36.8 C)  TempSrc:  Oral Oral Oral  SpO2:  92% 98% 95%  Weight:      Height:        Body mass index is 29 kg/m.  General exam: Pleasant, elderly Caucasian male. Skin: No rashes, lesions or ulcers. HEENT: Atraumatic, normocephalic, no obvious bleeding Lungs: Clear to auscultation bilaterally,  CVS: S1, S2, no murmur,   GI/Abd: Soft, nontender, nondistended, bowel sound present,   CNS: Alert, awake, oriented x 3 Psychiatry: Mood appropriate Extremities: No pedal edema, no calf tenderness,    The results of significant diagnostics from this hospitalization (including imaging, microbiology, ancillary and laboratory) are listed below for reference.    Procedures and Diagnostic Studies:   MR Abdomen W Wo Contrast Result Date: 01/08/2024 CLINICAL DATA:  Right renal mass. EXAM: MRI ABDOMEN WITHOUT AND WITH CONTRAST TECHNIQUE: Multiplanar multisequence MR imaging of the abdomen  was performed both before and after the administration of intravenous contrast. CONTRAST:  10 mL Vueway  COMPARISON:  CT on 11/16/2023 FINDINGS: Lower chest: No acute findings. Hepatobiliary: No hepatic masses identified. Prior cholecystectomy. No evidence of biliary obstruction. Pancreas:  No mass or inflammatory changes. Spleen:  Within normal limits in size and appearance. Adrenals/Urinary Tract: Normal adrenal glands. Several benign Bosniak category 1 and 2 cysts are seen in both kidneys. A solid enhancing exophytic mass is seen arising from the upper pole of the right kidney, which measures 8.8 x 6.4 cm, consistent with renal cell carcinoma. No hydronephrosis. Stomach/Bowel: Unremarkable. Vascular/Lymphatic: Shotty retroperitoneal lymph nodes are seen in aorto-caval and left paraaortic spaces measuring up to 10 mm. No acute vascular findings. No evidence renal  vein or IVC tumor thrombus. Other:  None. Musculoskeletal:  No suspicious bone lesions identified. IMPRESSION: 8.8 cm solid enhancing mass arising from the upper pole of the right kidney, consistent with renal cell carcinoma. Shotty retroperitoneal lymph nodes measuring up to 10 mm, which are nonspecific. Lymph node metastases cannot be excluded. No other sites of abdominal metastatic disease. Electronically Signed   By: Norleen DELENA Kil M.D.   On: 01/08/2024 06:37   DG FEMUR PORT MIN 2 VIEWS LEFT Result Date: 01/02/2024 CLINICAL DATA:  [id number] Fracture [id number] EXAM: LEFT FEMUR PORTABLE 2 VIEWS COMPARISON:  November twenty-first 2025 FINDINGS: Status post interval removal of a short stem intramedullary rod with replacement with a long stem intramedullary rod within the LEFT femur. Fracture fragments are in mildly improved alignment with mild persistent apex posterior angulation and comminution. Comminuted appearance of the lesser trochanter. Adjacent soft tissue air. Osteopenia. Vascular calcifications. Degenerative changes of the knee. IMPRESSION: Expected immediate postsurgical appearance status post long-stem placement of a femoral intramedullary rod. Electronically Signed   By: Corean Salter M.D.   On: 01/02/2024 12:03   DG FEMUR MIN 2 VIEWS LEFT Result Date: 01/02/2024 CLINICAL DATA:  [id number] Surgery, elective [id number] EXAM: LEFT FEMUR 2 VIEWS COMPARISON:  January 01, 2024 FINDINGS: Spot fluoroscopy images were obtained for surgical planning purposes. This demonstrates revisualization of a fracture inferior to prior LEFT intramedullary femoral rod. Fluoroscopic images demonstrate subsequent removal of prior LEFT femoral intramedullary rod with replacement with a longer intramedullary rod to span site of fracture. Vascular calcifications. Time: 3 minute 16 seconds Dose: 23.46 mGy Please reference procedure report for further details. IMPRESSION: Spot fluoroscopy images obtained for surgical planning purposes.  Electronically Signed   By: Corean Salter M.D.   On: 01/02/2024 12:01   DG C-Arm 1-60 Min-No Report Result Date: 01/02/2024 Fluoroscopy was utilized by the requesting physician.  No radiographic interpretation.   DG C-Arm 1-60 Min-No Report Result Date: 01/02/2024 Fluoroscopy was utilized by the requesting physician.  No radiographic interpretation.   DG Hip Unilat With Pelvis 2-3 Views Left Result Date: 01/01/2024 EXAM: 2 or 3 VIEW(S) XRAY OF THE LEFT HIP 01/01/2024 10:47:47 PM COMPARISON: 11/16/2023 CLINICAL HISTORY: fall, pain FINDINGS: BONES AND JOINTS: Prior internal fixation across the left femoral intertrochanteric fracture. Acute periprosthetic fracture in the proximal left femoral shaft with mild displacement. No dislocation. SOFT TISSUES: The soft tissues are unremarkable. IMPRESSION: 1. Acute periprosthetic fracture in the proximal left femoral shaft with mild displacement. Electronically signed by: Franky Crease MD 01/01/2024 10:52 PM EST RP Workstation: [id number]   CT CERVICAL SPINE WO CONTRAST Result Date: 01/01/2024 EXAM: CT CERVICAL SPINE WITHOUT CONTRAST 01/01/2024 10:12:00 PM TECHNIQUE: CT of the cervical spine was performed without the administration of intravenous contrast.  Multiplanar reformatted images are provided for review. Automated exposure control, iterative reconstruction, and/or weight based adjustment of the mA/kV was utilized to reduce the radiation dose to as low as reasonably achievable. COMPARISON: 11/16/2023 CLINICAL HISTORY: Neck trauma (Age >= 65y) FINDINGS: CERVICAL SPINE: BONES AND ALIGNMENT: No acute fracture or traumatic malalignment. DEGENERATIVE CHANGES: Advanced bilateral degenerative facet disease diffusely. Large flowing anterior osteophytes throughout the cervical spine. SOFT TISSUES: No prevertebral soft tissue swelling. IMPRESSION: 1. No acute abnormality of the cervical spine related to the reported neck trauma. Electronically signed by: Franky Crease MD 01/01/2024 10:34 PM EST RP Workstation: [id number]   CT HEAD WO CONTRAST Result Date: 01/01/2024 EXAM: CT HEAD WITHOUT CONTRAST 01/01/2024 10:12:00 PM TECHNIQUE: CT of the head was performed without the administration of intravenous contrast. Automated exposure control, iterative reconstruction, and/or weight based adjustment of the mA/kV was utilized to reduce the radiation dose to as low as reasonably achievable. COMPARISON: 11/16/2023 CLINICAL HISTORY: Head trauma, minor (Age >= 65y) FINDINGS: BRAIN AND VENTRICLES: There is atrophy and chronic small vessel disease throughout the deep white matter. No acute hemorrhage. No evidence of acute infarct. No hydrocephalus. No extra-axial collection. No mass effect or midline shift. ORBITS: No acute abnormality. SINUSES: No acute abnormality. SOFT TISSUES AND SKULL: No acute soft tissue abnormality. No skull fracture. IMPRESSION: 1. No acute intracranial abnormality. 2. Chronic atrophy and small vessel ischemic changes in the deep white matter. Electronically signed by: Franky Crease MD 01/01/2024 10:33 PM EST RP Workstation: [id number]     Labs:   Basic Metabolic Panel: Recent Labs  Lab 01/01/24 2147 01/03/24 0612 01/05/24 0515 01/07/24 0423  NA 135 138 135 136  K 3.5 3.3* 3.3* 4.0  CL 100 101 99 99  CO2 24 26 26 27   GLUCOSE 242* 192* 179* 263*  BUN 26* 25* 22 20  CREATININE 1.40* 1.45* 1.15 1.18  CALCIUM  9.5 9.2 9.2 9.1  MG  --   --  1.7  --   PHOS  --   --  2.5  --    GFR Estimated Creatinine Clearance: 51.7 mL/min (by C-G formula based on SCr of 1.18 mg/dL). Liver Function Tests: No results for input(s): AST, ALT, ALKPHOS, BILITOT, PROT, ALBUMIN  in the last 168 hours. No results for input(s): LIPASE, AMYLASE in the last 168 hours. No results for input(s): AMMONIA in the last 168 hours. Coagulation profile Recent Labs  Lab 01/01/24 2147  INR 0.9    CBC: Recent Labs  Lab 01/01/24 2147 01/03/24 0612  01/04/24 0437 01/05/24 0515 01/07/24 0423  WBC 9.0 11.0* 9.8 9.6 8.5  NEUTROABS 5.6  --   --   --  5.0  HGB 13.7 11.1* 10.6* 11.1* 11.4*  HCT 41.0 33.6* 32.4* 33.9* 34.5*  MCV 92.8 93.9 93.9 93.6 93.5  PLT 333 276 272 283 364   Cardiac Enzymes: No results for input(s): CKTOTAL, CKMB, CKMBINDEX, TROPONINI in the last 168 hours. BNP: Invalid input(s): POCBNP CBG: Recent Labs  Lab 01/07/24 0915 01/07/24 1149 01/07/24 1714 01/07/24 2030 01/08/24 0700  GLUCAP 255* 175* 261* 185* 164*   D-Dimer No results for input(s): DDIMER in the last 72 hours. Hgb A1c No results for input(s): HGBA1C in the last 72 hours. Lipid Profile No results for input(s): CHOL, HDL, LDLCALC, TRIG, CHOLHDL, LDLDIRECT in the last 72 hours. Thyroid  function studies No results for input(s): TSH, T4TOTAL, T3FREE, THYROIDAB in the last 72 hours.  Invalid input(s): FREET3 Anemia work up No results for input(s): VITAMINB12, FOLATE,  FERRITIN, TIBC, IRON, RETICCTPCT in the last 72 hours. Microbiology Recent Results (from the past 240 hours)  Surgical pcr screen     Status: None   Collection Time: 01/02/24  2:59 AM   Specimen: Nasal Mucosa; Nasal Swab  Result Value Ref Range Status   MRSA, PCR NEGATIVE NEGATIVE Final   Staphylococcus aureus NEGATIVE NEGATIVE Final    Comment: (NOTE) The Xpert SA Assay (FDA approved for NASAL specimens in patients 1 years of age and older), is one component of a comprehensive surveillance program. It is not intended to diagnose infection nor to guide or monitor treatment. Performed at [facility], [address], [place], [place] [address]     Time coordinating discharge: 45 minutes  Signed: Kaileah Shevchenko  [facility] 01/08/2024, 11:11 AM

## 2024-01-08 NOTE — Plan of Care (Signed)
  Problem: Metabolic: Goal: Ability to maintain appropriate glucose levels will improve Outcome: Progressing   Problem: Nutritional: Goal: Maintenance of adequate nutrition will improve Outcome: Progressing   Problem: Elimination: Goal: Will not experience complications related to bowel motility Outcome: Progressing   Problem: Pain Managment: Goal: General experience of comfort will improve and/or be controlled Outcome: Progressing   Problem: Safety: Goal: Ability to remain free from injury will improve Outcome: Progressing

## 2024-01-22 ENCOUNTER — Telehealth: Payer: Self-pay | Admitting: Family Medicine

## 2024-01-22 ENCOUNTER — Other Ambulatory Visit: Payer: Self-pay | Admitting: Family Medicine

## 2024-01-22 NOTE — Telephone Encounter (Unsigned)
 Copied from CRM #[id number]. Topic: Clinical - Medication Refill >> Jan 22, 2024  3:46 PM Tiffini S wrote: Medication:  methocarbamol  (ROBAXIN ) 500 MG tablet   Has the patient contacted their pharmacy? Yes (Agent: If no, request that the patient contact the pharmacy for the refill. If patient does not wish to contact the pharmacy document the reason why and proceed with request.) (Agent: If yes, when and what did the pharmacy advise?)  This is the patient's preferred pharmacy:  [facility] - [place], [place] - [address] [place] [place] [place] [address] Phone: [phone] Fax: [phone]   Is this the correct pharmacy for this prescription? Yes If no, delete pharmacy and type the correct one.   Has the prescription been filled recently? Yes  Is the patient out of the medication? Yes  Has the patient been seen for an appointment in the last year OR does the patient have an upcoming appointment? Yes   Can we respond through MyChart? No please call [phone]   Agent: Please be advised that Rx refills may take up to 3 business days. We ask that you follow-up with your pharmacy.

## 2024-01-25 ENCOUNTER — Telehealth: Payer: Self-pay | Admitting: Family Medicine

## 2024-01-25 ENCOUNTER — Telehealth: Payer: Self-pay

## 2024-01-25 NOTE — Telephone Encounter (Signed)
 Noted. Thanks.

## 2024-01-25 NOTE — Telephone Encounter (Signed)
 To which pharmacy did Dr. Delice send the rxs?  Did they contact Dr. Delice about the rxs?  I need the notes from Dr. Delice about the rxs.    The inpatient notes state he was to take eliquis  2.5 twice daily for 30 days, but that window started last month.  Rx was done for that last month, per EMR.   Please let me know about any details you can get.  Thanks.

## 2024-01-25 NOTE — Transitions of Care (Post Inpatient/ED Visit) (Signed)
 01/25/2024  Name: [name] MRN: [id number] DOB: 17-Mar-1934  Today's TOC FU Call Status: Today's TOC FU Call Status:: Successful TOC FU Call Completed TOC FU Call Complete Date: 01/25/24  Patient's Name and Date of Birth confirmed. DOB, Name  Transition Care Management Follow-up Telephone Call Date of Discharge: 01/22/24 Discharge Facility: Other [facility]) Name of Other (Non[facility]) Discharge Facility: [facility] Type of Discharge: Inpatient Admission Primary Inpatient Discharge Diagnosis:: left femur fracture How have you been since you were released from the hospital?: Better Any questions or concerns?: No  Items Reviewed: Did you receive and understand the discharge instructions provided?: Yes Medications obtained,verified, and reconciled?: Yes (Medications Reviewed) Any new allergies since your discharge?: No Dietary orders reviewed?: NA Do you have support at home?: Yes People in Home [RPT]: child(ren), adult  Medications Reviewed Today: Medications Reviewed Today     Reviewed by Lavelle Charmaine NOVAK, LPN (Licensed Practical Nurse) on 01/25/24 at 1037  Med List Status: <None>   Medication Order Taking? Sig Documenting Provider Last Dose Status Informant  Accu-Chek Softclix Lancets lancets [id number]  TEST BLOOD SUGAR UP TO THREE TIMES DAILY [name], MD  Active Pharmacy Records, Child  acetaminophen  (TYLENOL ) 500 MG tablet [id number]  Take 2 tablets (1,000 mg total) by mouth 3 (three) times daily. Arlice Reichert, MD  Active   amLODipine  (NORVASC ) 10 MG tablet [id number]  Take 1 tablet (10 mg total) by mouth every morning. Arlice Reichert, MD  Active   apixaban  (ELIQUIS ) 2.5 MG TABS tablet [id number]  Take 1 tablet (2.5 mg total) by mouth 2 (two) times daily. Danton Lauraine LABOR, PA-C  Active   Blood Glucose Calibration (ACCU-CHEK AVIVA) SOLN [id number]  Use as directed monthly. E11.59  Insulin  dependent [name], MD  Active Pharmacy Records,  Child  Blood Glucose Monitoring Suppl [facility] BLOOD GLUCOSE SYSTEM) w/Device KIT [id number]  1 each by Does not apply route 3 (three) times daily. [name], MD  Active Pharmacy Records, Child  carvedilol  (COREG ) 12.5 MG tablet [id number] No TAKE 1 TABLET BY MOUTH TWICE  DAILY WITH MEALS [name], MD 01/01/2024 Evening Active Pharmacy Records, Child  COD LIVER OIL PO [id number] No Take 1 capsule by mouth 2 (two) times daily.  [provider] 01/01/2024 Noon Active Pharmacy Records, Child  cyanocobalamin  (VITAMIN B12) 1000 MCG tablet [id number] No Take 1 tablet (1,000 mcg total) by mouth daily. [name], MD 01/01/2024 Noon Active Pharmacy Records, Child  diclofenac  Sodium (VOLTAREN ) 1 % GEL [id number] No Apply 2 g topically 4 (four) times daily. Vann, Jessica U, DO 01/01/2024 Evening Active Pharmacy Records, Child  DROPLET PEN NEEDLES 30G X 8 MM MISC [id number]  USE  WITH  INSULIN   PEN [name], MD  Active Pharmacy Records, Child  empagliflozin  (JARDIANCE ) 10 MG TABS tablet [id number] No Take 1 tablet (10 mg total) by mouth daily before breakfast. Bill secondary: BIN [id number], PCN [id number], GRP [id number], ID [id number] [name], MD 01/01/2024 Noon Active Pharmacy Records, Child  escitalopram  (LEXAPRO ) 20 MG tablet [id number] No Take 1 tablet (20 mg total) by mouth daily. [name], MD 01/01/2024 Noon Active Pharmacy Records, Child  furosemide  (LASIX ) 40 MG tablet [id number] No Take 1 tablet (40 mg total) by mouth daily. Can take an additional 20mg  as needed for weight gain of 2 lbs in a day or 5lbs in a week Wyn Jackee VEAR Mickey., NP 01/01/2024 Noon Active Pharmacy Records, Child  glucose blood (CONTOUR  TEST) test strip [id number]  Use as instructed [name], MD  Active Pharmacy Records, Child  hydrALAZINE  (APRESOLINE ) 50 MG tablet [id number] No TAKE 1 TABLET BY MOUTH 3 TIMES  DAILY [name], MD 01/01/2024 Evening Active Pharmacy Records, Child  insulin   aspart (NOVOLOG ) 100 UNIT/ML injection [id number]  Inject 0-9 Units into the skin 3 (three) times daily with meals. Dahal, Binaya, MD  Active   insulin  glargine-yfgn (SEMGLEE ) 100 UNIT/ML injection [id number]  Inject 0.3 mLs (30 Units total) into the skin daily at 10 pm. Arlice Reichert, MD  Active   losartan  (COZAAR ) 50 MG tablet [id number]  Take 1 tablet (50 mg total) by mouth daily. Arlice Reichert, MD  Active   methocarbamol  (ROBAXIN ) 500 MG tablet [id number] No Take 0.5 tablets (250 mg total) by mouth 4 (four) times daily. Raenelle Coria, MD 01/01/2024 Evening Active Child, Pharmacy Records  polyethylene glycol (MIRALAX  / GLYCOLAX ) 17 g packet [id number]  Take 17 g by mouth daily as needed for moderate constipation. Arlice Reichert, MD  Active   senna-docusate (SENOKOT-S) 8.6-50 MG tablet [id number]  Take 1 tablet by mouth 2 (two) times daily. Arlice Reichert, MD  Active   tamsulosin  (FLOMAX ) 0.4 MG CAPS capsule [id number]  Take 1 capsule (0.4 mg total) by mouth daily after supper. Arlice Reichert, MD  Active   traMADol  (ULTRAM ) 50 MG tablet [id number]  Take 1-2 tablets (50-100 mg total) by mouth every 6 (six) hours as needed for moderate pain (pain score 4-6) or severe pain (pain score 7-10). Danton Lauraine DELENA DEVONNA  Active             Home Care and Equipment/Supplies: Were Home Health Services Ordered?: Yes Name of Home Health Agency:: [facility] Has Agency set up a time to come to your home?: Yes First Home Health Visit Date: 01/24/24 Any new equipment or medical supplies ordered?: NA  Functional Questionnaire: Do you need assistance with bathing/showering or dressing?: Yes Do you need assistance with meal preparation?: Yes Do you need assistance with eating?: No Do you have difficulty maintaining continence: No Do you need assistance with getting out of bed/getting out of a chair/moving?: Yes Do you have difficulty managing or taking your medications?: No  Follow up appointments reviewed: PCP  Follow-up appointment confirmed?: Yes Date of PCP follow-up appointment?: 01/26/24 Follow-up Provider: Dr. Cleatus Specialist [facility] Follow-up appointment confirmed?: NA Do you need transportation to your follow-up appointment?: No Do you understand care options if your condition(s) worsen?: Yes-patient verbalized understanding    SIGNATURE Charmaine Bloodgood, LPN [facility] Health Advisor [place] [facility] You Are. We Are. [facility] Direct Dial  [phone]

## 2024-01-25 NOTE — Telephone Encounter (Signed)
 Copied from CRM #[id number]. Topic: Clinical - Medication Question >> Jan 22, 2024  3:39 PM Aisha D wrote: Reason for CRM: Pt's son [name] stated that the pt was released from [facility] today and was prescribed eliquis  2.5MG  30 day supply and Doxycycline  100MG  5 tabs. [name] stated that the pt hasn't received the medication and wd wants to know if Dr.Duncan is able to write a prescription for these medications. [name] would like a callback with an update, CB [phone]

## 2024-01-26 ENCOUNTER — Encounter: Payer: Self-pay | Admitting: Family Medicine

## 2024-01-26 ENCOUNTER — Ambulatory Visit: Admitting: Family Medicine

## 2024-01-26 VITALS — BP 174/80 | HR 74 | Temp 98.3°F | Ht 71.0 in | Wt 215.5 lb

## 2024-01-26 DIAGNOSIS — M978XXA Periprosthetic fracture around other internal prosthetic joint, initial encounter: Secondary | ICD-10-CM

## 2024-01-26 DIAGNOSIS — N2889 Other specified disorders of kidney and ureter: Secondary | ICD-10-CM

## 2024-01-26 DIAGNOSIS — E1159 Type 2 diabetes mellitus with other circulatory complications: Secondary | ICD-10-CM

## 2024-01-26 DIAGNOSIS — Z96649 Presence of unspecified artificial hip joint: Secondary | ICD-10-CM

## 2024-01-26 MED ORDER — APIXABAN 2.5 MG PO TABS: 2.5000 mg | ORAL_TABLET | Freq: Two times a day (BID) | ORAL | 0 refills | Status: DC

## 2024-01-26 MED ORDER — METHOCARBAMOL 500 MG PO TABS: 250.0000 mg | ORAL_TABLET | Freq: Four times a day (QID) | ORAL | 1 refills | Status: AC | PRN

## 2024-01-26 MED ORDER — ASPIRIN 81 MG PO TBEC: DELAYED_RELEASE_TABLET | ORAL | Status: AC

## 2024-01-26 NOTE — Telephone Encounter (Signed)
 Patient was seen in office today was this addressed?

## 2024-01-26 NOTE — Telephone Encounter (Signed)
 Sent. Thanks.

## 2024-01-26 NOTE — Patient Instructions (Addendum)
 I would restart eliquis  for 5 days and then change to aspirin  when done with eliquis .   Labs today.   Would take tylenol  for pain with tramadol .  Take methocarbamol  if needed for muscle spasms.   Let me know if that isn't helping the pain.  Take care.  Glad to see you.

## 2024-01-26 NOTE — Progress Notes (Unsigned)
 Inpatient course discussed with patient, followed by SNF placement after left periprosthetic femur fracture with surgical repair.  He improved to the point where he could be discharged home.  Family is caring for patient in the meantime at home.    D/w pt about continuing eliquis  for 5 more days for 30 days total.  Rx sent.  Would restart aspirin  after finishing eliquis .  Med list reviewed in detail.  Labs pending. Has PT ongoing at home.   We talked about his renal lesion, with his son's permission.  Needs appointment with urology, referral placed.  Meds, vitals, and allergies reviewed.   ROS: Per HPI unless specifically indicated in ROS section   Nad Ncat In wheelchair.  Able to stand and bear weight. MMM Neck supple, no LA Rrr Ctab Healing sites on the L leg x3.  No BLE edema.   45 minutes were devoted to patient care in this encounter (this includes time spent reviewing the patient's file/history, interviewing and examining the patient, counseling/reviewing plan with patient).

## 2024-01-27 ENCOUNTER — Ambulatory Visit: Payer: Self-pay | Admitting: Family Medicine

## 2024-01-27 DIAGNOSIS — M978XXA Periprosthetic fracture around other internal prosthetic joint, initial encounter: Secondary | ICD-10-CM | POA: Insufficient documentation

## 2024-01-27 LAB — CBC WITH DIFFERENTIAL/PLATELET
Basophils Absolute: 0.1 K/uL (ref 0.0–0.1)
Basophils Relative: 0.9 % (ref 0.0–3.0)
Eosinophils Absolute: 0.5 K/uL (ref 0.0–0.7)
Eosinophils Relative: 6.8 % — ABNORMAL HIGH (ref 0.0–5.0)
HCT: 36.5 % — ABNORMAL LOW (ref 39.0–52.0)
Hemoglobin: 12.2 g/dL — ABNORMAL LOW (ref 13.0–17.0)
Lymphocytes Relative: 29.4 % (ref 12.0–46.0)
Lymphs Abs: 2 K/uL (ref 0.7–4.0)
MCHC: 33.5 g/dL (ref 30.0–36.0)
MCV: 92.3 fl (ref 78.0–100.0)
Monocytes Absolute: 0.5 K/uL (ref 0.1–1.0)
Monocytes Relative: 7.7 % (ref 3.0–12.0)
Neutro Abs: 3.8 K/uL (ref 1.4–7.7)
Neutrophils Relative %: 55.2 % (ref 43.0–77.0)
Platelets: 376 K/uL (ref 150.0–400.0)
RBC: 3.96 Mil/uL — ABNORMAL LOW (ref 4.22–5.81)
RDW: 16 % — ABNORMAL HIGH (ref 11.5–15.5)
WBC: 6.8 K/uL (ref 4.0–10.5)

## 2024-01-27 LAB — COMPREHENSIVE METABOLIC PANEL WITH GFR
ALT: 9 U/L (ref 3–53)
AST: 11 U/L (ref 5–37)
Albumin: 3.6 g/dL (ref 3.5–5.2)
Alkaline Phosphatase: 139 U/L — ABNORMAL HIGH (ref 39–117)
BUN: 18 mg/dL (ref 6–23)
CO2: 27 meq/L (ref 19–32)
Calcium: 9.5 mg/dL (ref 8.4–10.5)
Chloride: 101 meq/L (ref 96–112)
Creatinine, Ser: 0.99 mg/dL (ref 0.40–1.50)
GFR: 68.69 mL/min (ref >60.00)
Glucose, Bld: 274 mg/dL — ABNORMAL HIGH (ref 70–99)
Potassium: 4.2 meq/L (ref 3.5–5.1)
Sodium: 136 meq/L (ref 135–145)
Total Bilirubin: 0.3 mg/dL (ref 0.2–1.2)
Total Protein: 6.3 g/dL (ref 6.0–8.3)

## 2024-01-27 LAB — HEMOGLOBIN A1C: Hgb A1c MFr Bld: 8.2 % — ABNORMAL HIGH (ref 4.6–6.5)

## 2024-01-27 NOTE — Assessment & Plan Note (Signed)
 History of, status post repair. I would restart eliquis  for 5 days and then change to aspirin  when done with eliquis .  Labs today.  See notes on labs. Would take tylenol  for pain with tramadol .  Take methocarbamol  if needed for muscle spasms.  Sedation caution. Continue PT.

## 2024-01-27 NOTE — Telephone Encounter (Signed)
 We addressed refill at the OV.  Thanks.

## 2024-01-27 NOTE — Assessment & Plan Note (Signed)
 We talked about his renal lesion, with his son's permission.  Needs appointment with urology, referral placed.

## 2024-02-01 ENCOUNTER — Telehealth: Payer: Self-pay

## 2024-02-01 NOTE — Telephone Encounter (Signed)
 Spoke with Lorrene of [facility], informing him Dr Cleatus is giving verbal orders for services requested. He verbalizes understanding and wants to make Dr Cleatus aware, pt had a fall but no injuries. EMS was called, helped pt up but pt refused to be transported to ED.

## 2024-02-01 NOTE — Telephone Encounter (Signed)
 Please give the order.  If he has any redness at the incision site then he needs that checked ASAP, either in clinic, UC or via home health.  Thanks.

## 2024-02-01 NOTE — Telephone Encounter (Signed)
 Copied from CRM #[id number]. Topic: Clinical - Home Health Verbal Orders >> Jan 29, 2024  3:08 PM Rea ORN wrote: Caller/Agency: [facility] Callback Number: [phone] Service Requested: Skilled Nursing, Social Worker, caregiver resources for the son since he is the only care giver to pt  Frequency: Med education and Management Any new concerns about the patient? Yes, redness around incision site

## 2024-02-02 ENCOUNTER — Other Ambulatory Visit: Payer: Self-pay | Admitting: Family Medicine

## 2024-02-02 MED ORDER — LOSARTAN POTASSIUM 50 MG PO TABS: 50.0000 mg | ORAL_TABLET | Freq: Every day | ORAL | 1 refills | Status: AC

## 2024-02-02 NOTE — Telephone Encounter (Signed)
 Copied from CRM [id number]. Topic: Clinical - Medication Refill >> Feb 02, 2024 10:39 AM Drema MATSU wrote: Medication: losartan  (COZAAR ) 50 MG tablet  Has the patient contacted their pharmacy? Yes (Agent: If no, request that the patient contact the pharmacy for the refill. If patient does not wish to contact the pharmacy document the reason why and proceed with request.) call didn't go through (Agent: If yes, when and what did the pharmacy advise?)  This is the patient's preferred pharmacy:  [facility] - [place], [place] - [address] [name] [place] [address] Phone: [phone] Fax: [phone]  Is this the correct pharmacy for this prescription? Yes If no, delete pharmacy and type the correct one.   Has the prescription been filled recently? No  Is the patient out of the medication? Yes  Has the patient been seen for an appointment in the last year OR does the patient have an upcoming appointment? Yes  Can we respond through MyChart? Yes  Agent: Please be advised that Rx refills may take up to 3 business days. We ask that you follow-up with your pharmacy.

## 2024-02-02 NOTE — Telephone Encounter (Signed)
 Noted. Thanks.

## 2024-02-05 ENCOUNTER — Telehealth: Payer: Self-pay

## 2024-02-05 ENCOUNTER — Other Ambulatory Visit: Payer: Self-pay | Admitting: Family Medicine

## 2024-02-05 NOTE — Telephone Encounter (Signed)
 Agree with this. Thanks.

## 2024-02-05 NOTE — Telephone Encounter (Signed)
 Called left detailed message on secured voicemail for April. Requested call back if any further questions.

## 2024-02-05 NOTE — Telephone Encounter (Signed)
 Copied from CRM #[id number]. Topic: Clinical - Medication Refill >> Feb 05, 2024  2:52 PM Berneda F wrote: Medication:  furosemide  (LASIX ) 40 MG tablet  Son wants to know if patient should still be taking his apixaban  (ELIQUIS ) 2.5 MG TABS tablet (if so, he will need a refill)  Has the patient contacted their pharmacy? Yes (Agent: If no, request that the patient contact the pharmacy for the refill. If patient does not wish to contact the pharmacy document the reason why and proceed with request.) (Agent: If yes, when and what did the pharmacy advise?)  This is the patient's preferred pharmacy:  [facility] - [place], [place] - [address] [place] [place] [address] Phone: [phone] Fax: [phone]  Is this the correct pharmacy for this prescription? Yes If no, delete pharmacy and type the correct one.   Has the prescription been filled recently? No  Is the patient out of the medication? No  Has the patient been seen for an appointment in the last year OR does the patient have an upcoming appointment? Yes  Can we respond through MyChart? Yes  Agent: Please be advised that Rx refills may take up to 3 business days. We ask that you follow-up with your pharmacy.

## 2024-02-05 NOTE — Telephone Encounter (Signed)
 Copied from CRM #[id number]. Topic: Clinical - Home Health Verbal Orders >> Feb 05, 2024 12:09 PM Thersia BROCKS wrote: Caller/Agency: April  Callback Number: [phone] Service Requested: Skilled Nursing Frequency: weekly for 4 weeks  Any new concerns about the patient? No

## 2024-02-08 MED ORDER — FUROSEMIDE 40 MG PO TABS: 40.0000 mg | ORAL_TABLET | Freq: Every day | ORAL | 1 refills | Status: AC

## 2024-02-09 ENCOUNTER — Other Ambulatory Visit: Payer: Self-pay | Admitting: Family Medicine

## 2024-02-12 ENCOUNTER — Telehealth: Payer: Self-pay | Admitting: Family Medicine

## 2024-02-12 NOTE — Telephone Encounter (Signed)
 Copied from CRM [id number]. Topic: Clinical - Medication Question >> Feb 12, 2024  4:25 PM Jayma L wrote: Reason for CRM: April the home health nurse called in wanting to know if the patient should still be taking apixaban  (ELIQUIS ) 2.5 MG TABS tablet.   Please reach April back at [phone]

## 2024-02-15 NOTE — Telephone Encounter (Signed)
 The plan per inpatient team was to take Eliquis  2.5 twice daily for 30 days (which should be completed at this point), then stop eliquis  and restart aspirin  81mg  a day.   My understanding is that at this point, patient should be on aspirin  and off eliquis .  Thanks.

## 2024-02-15 NOTE — Addendum Note (Signed)
 Addended by: CLEATUS LORELI RAMAN on: 02/15/2024 05:37 PM   Modules accepted: Orders

## 2024-02-16 NOTE — Telephone Encounter (Signed)
 Spoke with pt. Son Michael(on DPR) He is aware of keeping a check on Patients BP and letting us  know if its stays above 140/90 in the next few days to call us . Nothing further was needed.

## 2024-02-16 NOTE — Telephone Encounter (Unsigned)
 Copied from CRM #[id number]. Topic: Clinical - Home Health Verbal Orders >> Feb 16, 2024 10:11 AM Delon DASEN wrote: Caller/Agency: April with [facility] Callback Number: [phone] secure line Service Requested: Skilled Nursing Frequency: n/a Any new concerns about the patient? Yes- 164/80 after medication at 7am

## 2024-02-16 NOTE — Telephone Encounter (Signed)
 Please let me know if BP stays above 140/90 on mult checks over the next few days.  Thanks.

## 2024-02-16 NOTE — Telephone Encounter (Signed)
 Noted. Thanks.

## 2024-02-16 NOTE — Telephone Encounter (Signed)
 Called and spoke to April. Reviewed information she will call back if any further questions.

## 2024-02-23 ENCOUNTER — Ambulatory Visit: Payer: Self-pay

## 2024-02-23 NOTE — Telephone Encounter (Signed)
 Unable to reach patient for triage x 3 attempts.

## 2024-02-23 NOTE — Telephone Encounter (Signed)
 Pt HH RN calling to advise PCP that pt has been having elevated BGM of 150+ as well as elevated BP, lowest is 146/76, per RN pt has been having 160's/70's documented in home BP journal. RN confirms he is compliant with his BP medication, non compliant with blood sugar.    Attempt to contact pt for scheduling, no option to leave a VM, routing to call back.   Copied from CRM [id number]. Topic: Clinical - Medical Advice >> Feb 23, 2024 12:09 PM Montie POUR wrote: Reason for CRM:  April is calling to report his blood sugar levels and blood pressure. Today: 247 Blood sugar; 160/70 blood pressure 02/20/24 - 177 Blood sugar; 166/77 blood pressure. Please call April at [phone] to discuss. He does not live by himself.

## 2024-02-23 NOTE — Telephone Encounter (Signed)
 Second attempt to contact pt/family for triage, no answer, LVM for call back to PCP office. Placed in call back.

## 2024-02-26 ENCOUNTER — Ambulatory Visit: Payer: Self-pay

## 2024-02-26 NOTE — Telephone Encounter (Signed)
 FYI Only or Action Required?: Action required by provider: request for appointment.  Patient was last seen in primary care on 01/26/2024 by [name] Arlyss RAMAN, MD.  Called Nurse Triage reporting Hip Pain, Neck Pain, Back Pain, and Urinary Frequency.  Symptoms began chronic x years, worsening x several months ago.  Interventions attempted: Prescription medications: Tramadol .  Symptoms are: gradually worsening.  Triage Disposition: See PCP When Office is Open (Within 3 Days)  Patient/caregiver understands and will follow disposition?: No, wishes to speak with PCP         Message from [facility] sent at 02/26/2024  1:32 PM EST  Reason for Triage: patient is complaining of hip pain that's worsening, the son is on the line   Reason for Disposition  [1] MODERATE pain (e.g., interferes with normal activities, limping) AND [2] present > 3 days  Answer Assessment - Initial Assessment Questions Patient's son, [name], calling in on behalf of patient. He's been complaining of pain all over. Son unsure of specific details/pain level/symptoms.   Patient is able to stand and walk. Had a call last night, the chair slipped out of him and no injuries from the fall. Chronic issues with urinary incontinence, back pain, neck pain and left hip pain.BP has been 160-180s SBP Blood sugars in the 200s Worsening pain and urinary frequency x couple of months. Advised to be seen but he states patient will only see his PCP.  No fever, chest pain, SOB.  Protocols used: Hip Pain-A-AH

## 2024-02-26 NOTE — Telephone Encounter (Deleted)
" ° °  COPIED: Message from [facility] L sent at 02/26/2024  1:32 PM EST  Reason for Triage: patient is complaining of hip pain that's worsening, the son is on the line   "

## 2024-02-26 NOTE — Telephone Encounter (Signed)
 I spoke with [name] (DPR signed) [name] said since Christmas pt has had Lt hip pain, neck and back pain and frequency of urine. [name] said pt pain is worsening but pt will not be more specific of where pain is located and the pain level. Pt just tells his son he hurts. [name] said no new injury but pt has fallen x 2 recently but no new injury.  [name] said pt is more off balance now than usual. No CP or SOB.Pt can walk in home with walker. Offered pt appt with different provider  but pt only wants to see Dr Cleatus. Pt's son given appt with Dr Cleatus on 03/01/24 at 3 pm  with UC & ED precautions and pts son voiced understanding. Sending ntoe to Dr Cleatus, Cleatus pool and will teams Amy CMA.

## 2024-02-27 ENCOUNTER — Emergency Department (HOSPITAL_COMMUNITY)

## 2024-02-27 ENCOUNTER — Inpatient Hospital Stay (HOSPITAL_COMMUNITY)
Admission: EM | Admit: 2024-02-27 | Discharge: 2024-03-02 | DRG: 556 | Disposition: A | Discharge status: Home / Self Care | Attending: Internal Medicine | Admitting: Internal Medicine

## 2024-02-27 DIAGNOSIS — Z7984 Long term (current) use of oral hypoglycemic drugs: Secondary | ICD-10-CM

## 2024-02-27 DIAGNOSIS — Z79899 Other long term (current) drug therapy: Secondary | ICD-10-CM | POA: Diagnosis not present

## 2024-02-27 DIAGNOSIS — E669 Obesity, unspecified: Secondary | ICD-10-CM | POA: Diagnosis present

## 2024-02-27 DIAGNOSIS — E119 Type 2 diabetes mellitus without complications: Secondary | ICD-10-CM | POA: Diagnosis present

## 2024-02-27 DIAGNOSIS — Z794 Long term (current) use of insulin: Secondary | ICD-10-CM

## 2024-02-27 DIAGNOSIS — E785 Hyperlipidemia, unspecified: Secondary | ICD-10-CM | POA: Diagnosis present

## 2024-02-27 DIAGNOSIS — M25562 Pain in left knee: Secondary | ICD-10-CM | POA: Diagnosis present

## 2024-02-27 DIAGNOSIS — R531 Weakness: Secondary | ICD-10-CM | POA: Diagnosis present

## 2024-02-27 DIAGNOSIS — N401 Enlarged prostate with lower urinary tract symptoms: Secondary | ICD-10-CM | POA: Diagnosis present

## 2024-02-27 DIAGNOSIS — F445 Conversion disorder with seizures or convulsions: Secondary | ICD-10-CM | POA: Diagnosis present

## 2024-02-27 DIAGNOSIS — W19XXXA Unspecified fall, initial encounter: Secondary | ICD-10-CM

## 2024-02-27 DIAGNOSIS — Z87442 Personal history of urinary calculi: Secondary | ICD-10-CM

## 2024-02-27 DIAGNOSIS — Z885 Allergy status to narcotic agent status: Secondary | ICD-10-CM

## 2024-02-27 DIAGNOSIS — M25752 Osteophyte, left hip: Secondary | ICD-10-CM | POA: Diagnosis present

## 2024-02-27 DIAGNOSIS — Z888 Allergy status to other drugs, medicaments and biological substances status: Secondary | ICD-10-CM

## 2024-02-27 DIAGNOSIS — N390 Urinary tract infection, site not specified: Secondary | ICD-10-CM | POA: Diagnosis present

## 2024-02-27 DIAGNOSIS — I110 Hypertensive heart disease with heart failure: Secondary | ICD-10-CM | POA: Diagnosis present

## 2024-02-27 DIAGNOSIS — R296 Repeated falls: Secondary | ICD-10-CM | POA: Diagnosis present

## 2024-02-27 DIAGNOSIS — Z9049 Acquired absence of other specified parts of digestive tract: Secondary | ICD-10-CM

## 2024-02-27 DIAGNOSIS — Z85820 Personal history of malignant melanoma of skin: Secondary | ICD-10-CM

## 2024-02-27 DIAGNOSIS — I2510 Atherosclerotic heart disease of native coronary artery without angina pectoris: Secondary | ICD-10-CM | POA: Diagnosis present

## 2024-02-27 DIAGNOSIS — M25559 Pain in unspecified hip: Secondary | ICD-10-CM | POA: Diagnosis present

## 2024-02-27 DIAGNOSIS — Y92009 Unspecified place in unspecified non-institutional (private) residence as the place of occurrence of the external cause: Secondary | ICD-10-CM | POA: Diagnosis not present

## 2024-02-27 DIAGNOSIS — B9689 Other specified bacterial agents as the cause of diseases classified elsewhere: Secondary | ICD-10-CM | POA: Diagnosis present

## 2024-02-27 DIAGNOSIS — Z860100 Personal history of colon polyps, unspecified: Secondary | ICD-10-CM

## 2024-02-27 DIAGNOSIS — M6289 Other specified disorders of muscle: Secondary | ICD-10-CM | POA: Diagnosis present

## 2024-02-27 DIAGNOSIS — Z7982 Long term (current) use of aspirin: Secondary | ICD-10-CM | POA: Diagnosis not present

## 2024-02-27 DIAGNOSIS — Z8249 Family history of ischemic heart disease and other diseases of the circulatory system: Secondary | ICD-10-CM | POA: Diagnosis not present

## 2024-02-27 DIAGNOSIS — M25552 Pain in left hip: Secondary | ICD-10-CM | POA: Diagnosis present

## 2024-02-27 DIAGNOSIS — I5042 Chronic combined systolic (congestive) and diastolic (congestive) heart failure: Secondary | ICD-10-CM | POA: Diagnosis present

## 2024-02-27 DIAGNOSIS — Z9841 Cataract extraction status, right eye: Secondary | ICD-10-CM

## 2024-02-27 DIAGNOSIS — Z6827 Body mass index (BMI) 27.0-27.9, adult: Secondary | ICD-10-CM

## 2024-02-27 DIAGNOSIS — R7401 Elevation of levels of liver transaminase levels: Secondary | ICD-10-CM | POA: Diagnosis present

## 2024-02-27 DIAGNOSIS — R338 Other retention of urine: Secondary | ICD-10-CM | POA: Diagnosis present

## 2024-02-27 DIAGNOSIS — N2889 Other specified disorders of kidney and ureter: Secondary | ICD-10-CM | POA: Diagnosis present

## 2024-02-27 DIAGNOSIS — F419 Anxiety disorder, unspecified: Secondary | ICD-10-CM | POA: Diagnosis present

## 2024-02-27 DIAGNOSIS — E876 Hypokalemia: Secondary | ICD-10-CM | POA: Diagnosis present

## 2024-02-27 DIAGNOSIS — H9110 Presbycusis, unspecified ear: Secondary | ICD-10-CM | POA: Diagnosis present

## 2024-02-27 LAB — COMPREHENSIVE METABOLIC PANEL WITH GFR
ALT: 11 U/L (ref 0–44)
AST: 24 U/L (ref 15–41)
Albumin: 3.1 g/dL — ABNORMAL LOW (ref 3.5–5.0)
Alkaline Phosphatase: 80 U/L (ref 38–126)
Anion gap: 10 (ref 5–15)
BUN: 20 mg/dL (ref 8–23)
CO2: 22 mmol/L (ref 22–32)
Calcium: 8 mg/dL — ABNORMAL LOW (ref 8.9–10.3)
Chloride: 110 mmol/L (ref 98–111)
Creatinine, Ser: 0.99 mg/dL (ref 0.61–1.24)
GFR, Estimated: >60 mL/min
Glucose, Bld: 179 mg/dL — ABNORMAL HIGH (ref 70–99)
Potassium: 2.7 mmol/L — CL (ref 3.5–5.1)
Sodium: 141 mmol/L (ref 135–145)
Total Bilirubin: 0.2 mg/dL (ref 0.0–1.2)
Total Protein: 5.3 g/dL — ABNORMAL LOW (ref 6.5–8.1)

## 2024-02-27 LAB — CBC WITH DIFFERENTIAL/PLATELET
Abs Immature Granulocytes: 0.02 K/uL (ref 0.00–0.07)
Basophils Absolute: 0.1 K/uL (ref 0.0–0.1)
Basophils Relative: 1 %
Eosinophils Absolute: 0.2 K/uL (ref 0.0–0.5)
Eosinophils Relative: 2 %
HCT: 38.6 % — ABNORMAL LOW (ref 39.0–52.0)
Hemoglobin: 12.8 g/dL — ABNORMAL LOW (ref 13.0–17.0)
Immature Granulocytes: 0 %
Lymphocytes Relative: 27 %
Lymphs Abs: 2.3 K/uL (ref 0.7–4.0)
MCH: 30.3 pg (ref 26.0–34.0)
MCHC: 33.2 g/dL (ref 30.0–36.0)
MCV: 91.5 fL (ref 80.0–100.0)
Monocytes Absolute: 0.6 K/uL (ref 0.1–1.0)
Monocytes Relative: 7 %
Neutro Abs: 5.3 K/uL (ref 1.7–7.7)
Neutrophils Relative %: 63 %
Platelets: 311 K/uL (ref 150–400)
RBC: 4.22 MIL/uL (ref 4.22–5.81)
RDW: 14.6 % (ref 11.5–15.5)
WBC: 8.4 K/uL (ref 4.0–10.5)
nRBC: 0 % (ref 0.0–0.2)

## 2024-02-27 LAB — CBG MONITORING, ED: Glucose-Capillary: 219 mg/dL — ABNORMAL HIGH (ref 70–99)

## 2024-02-27 MED ORDER — ASPIRIN 81 MG PO TBEC
81.0000 mg | DELAYED_RELEASE_TABLET | Freq: Every day | ORAL | Status: DC
  Administered 2024-02-28 – 2024-03-02 (×4): 81 mg via ORAL
  Filled 2024-02-27 (×4): qty 1

## 2024-02-27 MED ORDER — FUROSEMIDE 40 MG PO TABS
40.0000 mg | ORAL_TABLET | Freq: Every day | ORAL | Status: DC
  Administered 2024-02-28 – 2024-03-02 (×4): 40 mg via ORAL
  Filled 2024-02-27 (×4): qty 1

## 2024-02-27 MED ORDER — KETOROLAC TROMETHAMINE 15 MG/ML IJ SOLN
15.0000 mg | Freq: Once | INTRAMUSCULAR | Status: AC
  Administered 2024-02-28: 15 mg via INTRAVENOUS
  Filled 2024-02-27: qty 1

## 2024-02-27 MED ORDER — LIDOCAINE 5 % EX PTCH
1.0000 | MEDICATED_PATCH | CUTANEOUS | Status: DC
  Administered 2024-02-27 – 2024-03-01 (×4): 1 via TRANSDERMAL
  Filled 2024-02-27 (×5): qty 1

## 2024-02-27 MED ORDER — ACETAMINOPHEN 325 MG PO TABS
650.0000 mg | ORAL_TABLET | Freq: Once | ORAL | Status: AC
  Administered 2024-02-27: 650 mg via ORAL
  Filled 2024-02-27: qty 2

## 2024-02-27 MED ORDER — POTASSIUM CHLORIDE CRYS ER 20 MEQ PO TBCR
40.0000 meq | EXTENDED_RELEASE_TABLET | Freq: Once | ORAL | Status: AC
  Administered 2024-02-27: 40 meq via ORAL
  Filled 2024-02-27: qty 2

## 2024-02-27 MED ORDER — IBUPROFEN 400 MG PO TABS
400.0000 mg | ORAL_TABLET | Freq: Four times a day (QID) | ORAL | Status: DC | PRN
  Administered 2024-02-28: 400 mg via ORAL
  Filled 2024-02-27: qty 1

## 2024-02-27 MED ORDER — TAMSULOSIN HCL 0.4 MG PO CAPS
0.4000 mg | ORAL_CAPSULE | Freq: Every day | ORAL | Status: DC
  Administered 2024-02-28 – 2024-03-01 (×3): 0.4 mg via ORAL
  Filled 2024-02-27 (×3): qty 1

## 2024-02-27 MED ORDER — CARVEDILOL 12.5 MG PO TABS
12.5000 mg | ORAL_TABLET | Freq: Two times a day (BID) | ORAL | Status: DC
  Administered 2024-02-28 – 2024-03-02 (×7): 12.5 mg via ORAL
  Filled 2024-02-27 (×7): qty 1

## 2024-02-27 MED ORDER — CYCLOBENZAPRINE HCL 10 MG PO TABS
5.0000 mg | ORAL_TABLET | Freq: Once | ORAL | Status: AC
  Administered 2024-02-28: 5 mg via ORAL
  Filled 2024-02-27: qty 1

## 2024-02-27 MED ORDER — INSULIN GLARGINE-YFGN 100 UNIT/ML ~~LOC~~ SOLN
30.0000 [IU] | Freq: Every day | SUBCUTANEOUS | Status: DC
  Administered 2024-02-28 – 2024-03-01 (×4): 30 [IU] via SUBCUTANEOUS
  Filled 2024-02-27 (×5): qty 0.3

## 2024-02-27 MED ORDER — INSULIN ASPART 100 UNIT/ML IJ SOLN: 0.0000 [IU] | Freq: Three times a day (TID) | INTRAMUSCULAR | Status: DC

## 2024-02-27 MED ORDER — HYDROCODONE-ACETAMINOPHEN 5-325 MG PO TABS
1.0000 | ORAL_TABLET | ORAL | Status: DC | PRN
  Administered 2024-02-28: 1 via ORAL
  Administered 2024-02-28: 2 via ORAL
  Filled 2024-02-27 (×2): qty 2

## 2024-02-27 MED ORDER — AMLODIPINE BESYLATE 5 MG PO TABS
10.0000 mg | ORAL_TABLET | Freq: Every morning | ORAL | Status: DC
  Administered 2024-02-28 – 2024-03-02 (×4): 10 mg via ORAL
  Filled 2024-02-27 (×4): qty 1

## 2024-02-27 MED ORDER — ONDANSETRON HCL 4 MG PO TABS: 4.0000 mg | ORAL_TABLET | Freq: Four times a day (QID) | ORAL | Status: DC | PRN

## 2024-02-27 MED ORDER — ONDANSETRON HCL 4 MG/2ML IJ SOLN: 4.0000 mg | Freq: Four times a day (QID) | INTRAMUSCULAR | Status: DC | PRN

## 2024-02-27 MED ORDER — POLYETHYLENE GLYCOL 3350 17 G PO PACK: 17.0000 g | PACK | Freq: Every day | ORAL | Status: DC | PRN

## 2024-02-27 MED ORDER — ALBUTEROL SULFATE (2.5 MG/3ML) 0.083% IN NEBU: 2.5000 mg | INHALATION_SOLUTION | RESPIRATORY_TRACT | Status: DC | PRN

## 2024-02-27 MED ORDER — EMPAGLIFLOZIN 10 MG PO TABS
10.0000 mg | ORAL_TABLET | Freq: Every day | ORAL | Status: DC
  Administered 2024-02-28 – 2024-03-02 (×4): 10 mg via ORAL
  Filled 2024-02-27 (×4): qty 1

## 2024-02-27 MED ORDER — METHOCARBAMOL 500 MG PO TABS
250.0000 mg | ORAL_TABLET | Freq: Three times a day (TID) | ORAL | Status: DC | PRN
  Administered 2024-02-28 (×2): 250 mg via ORAL
  Filled 2024-02-27 (×2): qty 1

## 2024-02-27 MED ORDER — HEPARIN SODIUM (PORCINE) 5000 UNIT/ML IJ SOLN
5000.0000 [IU] | Freq: Three times a day (TID) | INTRAMUSCULAR | Status: DC
  Administered 2024-02-28 – 2024-03-02 (×10): 5000 [IU] via SUBCUTANEOUS
  Filled 2024-02-27 (×10): qty 1

## 2024-02-27 MED ORDER — LOSARTAN POTASSIUM 50 MG PO TABS
50.0000 mg | ORAL_TABLET | Freq: Every day | ORAL | Status: DC
  Administered 2024-02-28 – 2024-03-02 (×4): 50 mg via ORAL
  Filled 2024-02-27 (×4): qty 1

## 2024-02-27 MED ORDER — ESCITALOPRAM OXALATE 20 MG PO TABS
20.0000 mg | ORAL_TABLET | Freq: Every day | ORAL | Status: DC
  Administered 2024-02-28 – 2024-03-02 (×4): 20 mg via ORAL
  Filled 2024-02-27 (×4): qty 1

## 2024-02-27 MED ORDER — VITAMIN B-12 1000 MCG PO TABS
1000.0000 ug | ORAL_TABLET | Freq: Every day | ORAL | Status: DC
  Administered 2024-02-28 – 2024-03-02 (×4): 1000 ug via ORAL
  Filled 2024-02-27 (×4): qty 1

## 2024-02-27 MED ORDER — TRAMADOL HCL 50 MG PO TABS
50.0000 mg | ORAL_TABLET | Freq: Once | ORAL | Status: AC
  Administered 2024-02-27: 50 mg via ORAL
  Filled 2024-02-27: qty 1

## 2024-02-27 NOTE — ED Provider Notes (Signed)
 " [place] EMERGENCY DEPARTMENT AT [facility] Provider Note   CSN: [id number] Arrival date & time: 02/27/24  1531     Patient presents with: [name] is a 89 y.o. male. With h/o DM2, HTN, HLD, CAD, diverticulosis, BPH, anxiety, renal mass, left femur fx s/p ORIF in November briefly in SNF and now back home walking with walker presents with left hip and knee pain. He cannot recall a fall but states his son told him that he missed the chair when trying to sit and landed on his bottom. No head injury or LOC. No thinners other than aspirin . Has had left knee and hip pain since fall but able to ambulate with walker with some difficulty according to patient.     Prior to Admission medications  Medication Sig Start Date End Date Taking? Authorizing Provider  Accu-Chek Softclix Lancets lancets TEST BLOOD SUGAR UP TO THREE TIMES DAILY 01/23/20   [name], MD  acetaminophen  (TYLENOL ) 500 MG tablet Take 2 tablets (1,000 mg total) by mouth 3 (three) times daily. 01/08/24   Arlice Reichert, MD  amLODipine  (NORVASC ) 10 MG tablet Take 1 tablet (10 mg total) by mouth every morning. 01/08/24   Arlice Reichert, MD  aspirin  EC 81 MG tablet Swallow whole. Take 1 daily after finishing eliquis . 01/26/24   [name], MD  Blood Glucose Calibration (ACCU-CHEK AVIVA) SOLN Use as directed monthly. E11.59  Insulin  dependent 12/06/18   [name], MD  Blood Glucose Monitoring Suppl (CONTOUR BLOOD GLUCOSE SYSTEM) w/Device KIT 1 each by Does not apply route 3 (three) times daily. 11/09/23   [name], MD  carvedilol  (COREG ) 12.5 MG tablet TAKE 1 TABLET BY MOUTH TWICE  DAILY WITH MEALS 11/13/23   [name], MD  COD LIVER OIL PO Take 1 capsule by mouth 2 (two) times daily.     [provider]  cyanocobalamin  (VITAMIN B12) 1000 MCG tablet Take 1 tablet (1,000 mcg total) by mouth daily. 09/29/23   [name], MD  diclofenac  Sodium (VOLTAREN ) 1 % GEL Apply 2  g topically 4 (four) times daily. 06/11/23   Vann, Jessica U, DO  DROPLET PEN NEEDLES 30G X 8 MM MISC USE  WITH  INSULIN   PEN 02/03/19   [name], MD  empagliflozin  (JARDIANCE ) 10 MG TABS tablet Take 1 tablet (10 mg total) by mouth daily before breakfast. Bill secondary: BIN [id number], PCN [id number], GRP [id number], ID [id number] 10/09/23   [name], MD  escitalopram  (LEXAPRO ) 20 MG tablet Take 1 tablet (20 mg total) by mouth daily. 09/29/23   [name], MD  furosemide  (LASIX ) 40 MG tablet Take 1 tablet (40 mg total) by mouth daily. Can take an additional 20mg  as needed for weight gain of 2 lbs in a day or 5lbs in a week 02/08/24   [name], MD  hydrALAZINE  (APRESOLINE ) 50 MG tablet TAKE 1 TABLET BY MOUTH 3 TIMES  DAILY 11/13/23   [name], MD  insulin  aspart (NOVOLOG ) 100 UNIT/ML injection Inject 0-9 Units into the skin 3 (three) times daily with meals. 01/08/24   Arlice Reichert, MD  insulin  glargine-yfgn (SEMGLEE ) 100 UNIT/ML injection Inject 0.3 mLs (30 Units total) into the skin daily at 10 pm. 01/08/24   Dahal, Reichert, MD  losartan  (COZAAR ) 50 MG tablet Take 1 tablet (50 mg total) by mouth daily. 02/02/24   [name], MD  methocarbamol  (ROBAXIN ) 500 MG  tablet Take 0.5 tablets (250 mg total) by mouth every 6 (six) hours as needed for muscle spasms. 01/26/24   [name], MD  [facility] ULTRA test strip USE TO CHECK UP TO 3 TIMES DAILY AS NEEDED 02/10/24   [name], MD  polyethylene glycol (MIRALAX  / GLYCOLAX ) 17 g packet Take 17 g by mouth daily as needed for moderate constipation. 01/08/24   Dahal, Chapman, MD  senna-docusate (SENOKOT-S) 8.6-50 MG tablet Take 1 tablet by mouth 2 (two) times daily. 01/08/24   Arlice Chapman, MD  tamsulosin  (FLOMAX ) 0.4 MG CAPS capsule Take 1 capsule (0.4 mg total) by mouth daily after supper. 01/08/24   Arlice Chapman, MD  traMADol  (ULTRAM ) 50 MG tablet Take 1-2 tablets (50-100 mg total) by mouth every 6 (six) hours as  needed for moderate pain (pain score 4-6) or severe pain (pain score 7-10). 01/04/24   Danton Lauraine LABOR, PA-C    Allergies: Morphine , Atorvastatin , Codeine phosphate, and Metformin  and related    Review of Systems + hip and knee pain Updated Vital Signs BP (!) 185/86 (BP Location: Right Arm)   Pulse 65   Temp 98 F (36.7 C) (Oral)   Resp (!) 22   SpO2 99%   Physical Exam  Constitutional: Patient appears well-developed and well-nourished. No distress.  HENT:  Head: Normocephalic and atraumatic.  Mouth/Throat: Oropharynx is clear and moist. No oropharyngeal exudate.  Eyes: Conjunctivae are normal. Pupils are equal, round, and reactive to light. Neck: Normal range of motion. Neck supple. No midline tenderness. Cardiovascular: Normal rate, regular rhythm, normal heart sounds and intact distal pulses.   Pulmonary/Chest: Effort normal and breath sounds normal. No respiratory distress. No wheezes or rales.  Abdominal: Soft. Bowel sounds are normal. No distension or tenderness.  Musculoskeletal: Normal inspection with no obvious deformity. Right hip and knee with full ROM and no pain. Left hip nontender with good ROM without pain. Left knee has pain with flexion past 90 degrees and crepitus, no bony point tenderness.  Neurological: Patient is alert and oriented.  No facial droop.  Clear speech.  Normal strength and sensation throughout.  Normal coordination. Skin: Skin is warm and dry. No diaphoresis.  Psychiatric: Normal mood and affect. Normal behavior. Judgment and thought content normal.  Nursing note and vitals reviewed.  (all labs ordered are listed, but only abnormal results are displayed) Labs Reviewed  COMPREHENSIVE METABOLIC PANEL WITH GFR - Abnormal; Notable for the following components:      Result Value   Potassium 2.7 (*)    Glucose, Bld 179 (*)    Calcium  8.0 (*)    Total Protein 5.3 (*)    Albumin  3.1 (*)    All other components within normal limits  CBC WITH  DIFFERENTIAL/PLATELET - Abnormal; Notable for the following components:   Hemoglobin 12.8 (*)    HCT 38.6 (*)    All other components within normal limits  CBG MONITORING, ED - Abnormal; Notable for the following components:   Glucose-Capillary 219 (*)    All other components within normal limits  URINALYSIS, ROUTINE W REFLEX MICROSCOPIC    EKG: EKG Interpretation Date/Time:  Saturday February 27 2024 19:51:14 EST Ventricular Rate:  66 PR Interval:  210 QRS Duration:  116 QT Interval:  425 QTC Calculation: 446 R Axis:   25  Text Interpretation: Sinus rhythm First degree A-V block Confirmed by Jonavan Vanhorn [phone]) on 02/27/2024 10:27:20 PM  Radiology: CT Hip Left Wo Contrast Result Date: 02/27/2024 EXAM: CT  OF THE LEFT HIP WITHOUT IV CONTRAST 02/27/2024 08:45:46 PM TECHNIQUE: CT of the left hip was performed without the administration of intravenous contrast. Multiplanar reformatted images are provided for review. Automated exposure control, iterative reconstruction, and/or weight based adjustment of the mA/kV was utilized to reduce the radiation dose to as low as reasonably achievable. COMPARISON: None available. CLINICAL HISTORY: Hip replacement, periprosthetic fracture suspected. FINDINGS: BONES: Old left femoral intertrochanteric fracture with extension into the proximal femoral shaft. Dynamic hip screw and femoral nail are in place across the fracture. Early evidence of healing within the femoral shaft component with callus formation. Otherwise, no significant evidence of healing. Fracture lines remain evident. Mild displacement of fracture fragments is present in the region of the proximal femoral shaft. No additional acute fracture seen. No aggressive appearing osseous abnormality or periostitis. SOFT TISSUE: Heterotopic bone formation is noted in the soft tissues medial to the proximal femoral shaft. No significant soft tissue edema or fluid collections. No soft tissue mass. JOINT: No  subluxation or dislocation. No significant degenerative changes. No osseous erosions. INTRAPELVIC CONTENTS: Aortoiliac atherosclerosis is present. Large stool burden is noted in the colon. IMPRESSION: 1. Old left femoral intertrochanteric fracture with extension into the proximal femoral shaft, treated with dynamic hip screw and femoral nail, with early callus formation in the femoral shaft component but otherwise no significant healing. 2. Heterotopic bone formation in the soft tissues medial to the proximal femoral shaft. Electronically signed by: Franky Crease MD 02/27/2024 09:01 PM EST RP Workstation: [id number]   CT PELVIS WO CONTRAST Result Date: 02/27/2024 EXAM: CT PELVIS, WITHOUT IV CONTRAST 02/27/2024 08:45:46 PM TECHNIQUE: Axial images were acquired through the pelvis without IV contrast. Reformatted images were reviewed. Automated exposure control, iterative reconstruction, and/or weight based adjustment of the mA/kV was utilized to reduce the radiation dose to as low as reasonably achievable. COMPARISON: None available. CLINICAL HISTORY: Pelvic fracture. FINDINGS: BONES: Old fracture is noted in the left femoral intertrochanteric region and extending into the proximal left femoral shaft. Dynamic hip screw and femoral nail noted in place. Early callus formation around the femoral shaft component of the fracture. Otherwise, no significant evidence of healing a fracture line remains evident. No acute fracture seen. JOINTS: No subluxation or dislocation. The joint spaces are normal. SOFT TISSUES: The soft tissues are unremarkable. INTRAPELVIC CONTENTS: Aortoiliac atherosclerosis. 2 mm nonobstructing stone in the lower pole of the left kidney. Large stool burden in the rectum could reflect fecal impaction. Prostate enlargement. IMPRESSION: 1. No acute fracture, subluxation, or dislocation. 2. Old fracture in the left femoral intertrochanteric region and proximal shaft with fixation hardware and early callus  formation around the shaft component, without additional evidence of significant interval healing. 3. Large stool burden in the rectum, which could reflect fecal impaction. 4. Left lower pole nephrolithiasis 5. Aortoiliac atherosclerosis. Electronically signed by: Franky Crease MD 02/27/2024 08:55 PM EST RP Workstation: [id number]   CT Lumbar Spine Wo Contrast Result Date: 02/27/2024 EXAM: CT OF THE LUMBAR SPINE WITHOUT CONTRAST 02/27/2024 08:45:46 PM TECHNIQUE: CT of the lumbar spine was performed without the administration of intravenous contrast. Multiplanar reformatted images are provided for review. Automated exposure control, iterative reconstruction, and/or weight based adjustment of the mA/kV was utilized to reduce the radiation dose to as low as reasonably achievable. COMPARISON: None available. CLINICAL HISTORY: Low back pain, increased fracture risk. FINDINGS: BONES AND ALIGNMENT: Normal vertebral body heights. Normal alignment. No suspicious bone lesion. Healing posterior left 11th rib fracture. DEGENERATIVE CHANGES: Diffuse degenerative disc  disease with disc space narrowing and anterior spurring. Diffuse bilateral degenerative facet disease. SOFT TISSUES: No acute abnormality. IMPRESSION: 1. No acute osseous abnormality of the lumbar spine. 2. Healing posterior left 11th rib fracture. Electronically signed by: Franky Crease MD 02/27/2024 08:50 PM EST RP Workstation: [id number]   DG Knee Complete 4 Views Left Result Date: 02/27/2024 EXAM: 4 VIEW(S) XRAY OF THE LEFT KNEE 02/27/2024 05:30:52 PM COMPARISON: None available. CLINICAL HISTORY: fall, s/p orif 11/25 FINDINGS: BONES AND JOINTS: Left femoral hardware partially visualized in the distal femur. Advanced tricompartmental degenerative changes in the left knee. Small joint effusion. No acute fracture, subluxation, or dislocation. SOFT TISSUES: Diffuse vascular calcifications. IMPRESSION: 1. No acute fracture, subluxation, or dislocation. 2. Advanced  tricompartmental degenerative changes in the left knee with small joint effusion. Electronically signed by: Franky Crease MD 02/27/2024 05:48 PM EST RP Workstation: [id number]   CT Cervical Spine Wo Contrast Result Date: 02/27/2024 EXAM: CT CERVICAL SPINE WITHOUT CONTRAST 02/27/2024 05:40:17 PM TECHNIQUE: CT of the cervical spine was performed without the administration of intravenous contrast. Multiplanar reformatted images are provided for review. Automated exposure control, iterative reconstruction, and/or weight based adjustment of the mA/kV was utilized to reduce the radiation dose to as low as reasonably achievable. COMPARISON: 01/01/2024 CLINICAL HISTORY: Neck trauma (Age >= 65y) FINDINGS: BONES AND ALIGNMENT: No acute fracture or traumatic malalignment. DEGENERATIVE CHANGES: Flowing anterior osteophytes compatible with diffuse idiopathic skeletal hyperostosis. Advanced diffuse bilateral degenerative facet disease. SOFT TISSUES: No prevertebral soft tissue swelling. IMPRESSION: 1. No evidence of acute cervical spine injury. 2. Diffuse idiopathic skeletal hyperostosis with advanced diffuse bilateral degenerative facet disease. Electronically signed by: Franky Crease MD 02/27/2024 05:47 PM EST RP Workstation: [id number]   CT Head Wo Contrast Result Date: 02/27/2024 EXAM: CT HEAD WITHOUT CONTRAST 02/27/2024 05:40:17 PM TECHNIQUE: CT of the head was performed without the administration of intravenous contrast. Automated exposure control, iterative reconstruction, and/or weight based adjustment of the mA/kV was utilized to reduce the radiation dose to as low as reasonably achievable. COMPARISON: None available. CLINICAL HISTORY: Head trauma, moderate-severe. FINDINGS: BRAIN AND VENTRICLES: Diffuse cerebral atrophy. No acute hemorrhage. No evidence of acute infarct. No hydrocephalus. No extra-axial collection. No mass effect or midline shift. ORBITS: No acute abnormality. SINUSES: No acute abnormality. SOFT  TISSUES AND SKULL: No acute soft tissue abnormality. No skull fracture. IMPRESSION: 1. No acute intracranial abnormality. Electronically signed by: Franky Crease MD 02/27/2024 05:45 PM EST RP Workstation: [id number]   DG Hip Unilat With Pelvis 2-3 Views Left Result Date: 02/27/2024 EXAM: 2 or 3 VIEW(S) XRAY OF THE LEFT HIP 02/27/2024 05:30:52 PM COMPARISON: Prior study. CLINICAL HISTORY: fall, s/p orif 11/25 FINDINGS: BONES AND JOINTS: Changes of internal fixation are seen in the left femur. There is an AP and oblique displaced proximal left femoral fracture, which is unchanged since the prior study with no clear significant healing. No acute fracture, subluxation, or dislocation. No malalignment. SOFT TISSUES: Unremarkable. IMPRESSION: 1. No acute fracture, subluxation, or dislocation. 2. Unchanged displaced proximal left femoral fracture with internal fixation, with no clear significant healing. Electronically signed by: Franky Crease MD 02/27/2024 05:44 PM EST RP Workstation: [id number]      Medications Ordered in the ED  lidocaine  (LIDODERM ) 5 % 1 patch (1 patch Transdermal Patch Applied 02/27/24 1756)  acetaminophen  (TYLENOL ) tablet 650 mg (650 mg Oral Given 02/27/24 1756)  potassium chloride  SA (KLOR-CON  M) CR tablet 40 mEq (40 mEq Oral Given 02/27/24 1905)  traMADol  (ULTRAM ) tablet 50 mg (50 mg Oral  Given 02/27/24 2101)    This patient presents to the ED with chief complaint(s) of left hip pain after a fall with pertinent past medical history of DM2, HTN, HLD, CAD, diverticulosis, BPH, anxiety, renal mass . The complaint involves an extensive differential diagnosis and also carries with it a high risk of complications and morbidity.    Additional history obtained from son. I have also reviewed admission records  The differential diagnosis includes fracture, soft tissue injury  The initial management included analgesia, imaging, basic labs    Reassessments:  The following labs were  independently interpreted: CBC and CMP significant for potassium of 2.7 and glucose of 179.  Urinalysis pending.  I independently visualized the following imaging with scope of interpretation limited to determining acute life threatening conditions related to emergency care: X-ray of left hip and left knee, CT head, C-spine, L-spine, pelvis, hip, which revealed no acute fracture or other traumatic injury  Treatment and Reassessment: 2200 Patient has ongoing pain and son states he is not able to ambulate due to pain.  No obvious fracture or other injury noted here today but no MRI so concerned for occult fracture.  Patient and son feel that he is not able to go home as he is not walking well and has continued pain that would put him at risk for another fall.  Here he has received Tylenol , Lidoderm , tramadol  and is still complaining of pain and cannot stand up or walk without my assistance on my exam.  Hypokalemia could also be contributing to this.  Consultation: - Consulted or discussed management/test interpretation with external professional: Hospitalist who advises muscle relaxant and toradol   Consideration for admission or further workup: Will admit for potassium repletion, PT/OT, pain management, possible MRI and/or orthopedic evaluation if needed.  Social Determinants of health: None  Final diagnoses:  Left hip pain    ED Discharge Orders     None          Fredia Rosette Kirsch, MD 02/27/24 2301  "

## 2024-02-27 NOTE — ED Triage Notes (Signed)
 Pt BIB [facility] from home, pt has been having an increase in falls at home per son. Pt complains of left hip pain, has had a previous hip replacement. Pt reports pain improves when standing and worsens when sitting (in flex position). Per EMS pt's son had two different stories for patients injuries  Pt received 200 mcg of fentanyl  with EMS 168/84 BP 80/s HR 94% RA 28 rr

## 2024-02-27 NOTE — H&P (Signed)
 " History and Physical    [name] FMW:[id number] DOB: 1935-02-06 DOA: 02/27/2024  PCP: [name], MD  Patient coming from: home  I have personally briefly reviewed patient's old medical records in [facility]  Chief Complaint: fall 1-2 days ago missed chair and fell to floor no with left hip pain  HPI: [name] is a 89 y.o. male with medical history significant of DM2, HTN, HLD, CAD, diverticulosis, BPH, anxiety, renal mass, hx of mechanical fall and left hip fracture s/p repair  01/02/2024 s/p which he was discharged to snf. Patient at home for few weeks noted to be weak but able to ambulate with walker, but had falls at home. Patient however around 1-2 days ago was attempting to sit on chair and missed chair and fell injuring his left hip. Patient notes pain worse with sitting and better with standing. Due to this patient has had difficulty with ambulation at home. Patient was brought in by family due to increasing pain and difficulty with ambulation and generalized weakness.    ED Course:  Afeb bp 157/81, hr 68, rr 22 , sat 96%  on 2L  Wbc 8.4, hgb 12.8, plt 311 Na 141, K 2.7, Cl 110, bicarb 22, glu 179, cr 0.99 Cxr IMPRESSION: 1. No acute fracture, subluxation, or dislocation. 2. Advanced tricompartmental degenerative changes in the left knee with small joint effusion.  CT hip IMPRESSION: 1. No acute fracture, subluxation, or dislocation. 2. Unchanged displaced proximal left femoral fracture with internal fixation, with no clear significant healing.  EKG: Nsr , first degree A-V block      Review of Systems: As per HPI otherwise 10 point review of systems negative.   Past Medical History:  Diagnosis Date   Anxiety    Borderline hyperlipidemia    CAD (coronary artery disease)    Colon polyps    Complication of anesthesia    trouble urinating after anesthesia   Diverticulosis of colon    DJD (degenerative joint disease)    DM (diabetes  mellitus) (HCC)    Adult onset  type 2   History of gastritis    History of kidney stones    surgery to remove   History of pyelonephritis    History of syncope    HTN (hypertension)    Hypertrophy of prostate with urinary obstruction and other lower urinary tract symptoms (LUTS)    Melanoma (HCC)    back of neck   Melanoma (HCC) 2022   per dermatology   Myoclonus    Pancreatitis due to biliary obstruction (HCC) several years ago   treated by Dr. Princella Nida    Past Surgical History:  Procedure Laterality Date   APPENDECTOMY     cartarized  nose blood vessel     left side   CATARACT EXTRACTION     RIGHT EYE   CHOLECYSTECTOMY     cystoscopy  04/17/2011   stent placed   CYSTOSCOPY W/ URETERAL STENT PLACEMENT  04/17/2011   Procedure: CYSTOSCOPY WITH RETROGRADE PYELOGRAM/URETERAL STENT PLACEMENT;  Surgeon: Thomasine Oiler, MD;  Location: [facility];  Service: Urology;  Laterality: Left;   CYSTOSCOPY WITH RETROGRADE PYELOGRAM, URETEROSCOPY AND STENT PLACEMENT Left 04/18/2013   Procedure: CYSTOSCOPY WITH LEFT RETROGRADE PYELOGRAM, LEFT URETEROSCOPY AND LASER LITHOTRIPSY LEFT STENT PLACEMENT;  Surgeon: Noretta Ferrara, MD;  Location: [facility];  Service: Urology;  Laterality: Left;   HOLMIUM LASER APPLICATION Left 04/18/2013   Procedure: HOLMIUM LASER APPLICATION;  Surgeon: Noretta Ferrara, MD;  Location: [facility];  Service: Urology;  Laterality: Left;   INCISION AND DRAINAGE ABSCESS Left 04/04/2020   Procedure: SELBY OF LEFT ARM HEMATOMA;  Surgeon: Aron Shoulders, MD;  Location: [facility];  Service: General;  Laterality: Left;  left   INTRAMEDULLARY (IM) NAIL INTERTROCHANTERIC Left 11/17/2023   Procedure: FIXATION, FRACTURE, INTERTROCHANTERIC, WITH INTRAMEDULLARY ROD;  Surgeon: Sherida Adine BROCKS, MD;  Location: [facility];  Service: Orthopedics;  Laterality: Left;   INTRAMEDULLARY (IM) NAIL INTERTROCHANTERIC Left 01/02/2024   Procedure: FIXATION, FRACTURE, INTERTROCHANTERIC, WITH INTRAMEDULLARY ROD;  Surgeon:  Kendal Franky SQUIBB, MD;  Location: [facility];  Service: Orthopedics;  Laterality: Left;   IRRIGATION AND DEBRIDEMENT ABSCESS Left 10/05/2014   Procedure: WILHEMENA D LEFT INDEX FINGER;  Surgeon: Balinda Rogue, MD;  Location: [facility];  Service: Plastics;  Laterality: Left;   KNEE SURGERY     bilateral ARTHROSCOPY X2 TO EACH KNEE   MELANOMA EXCISION  2018   MELANOMA EXCISION WITH SENTINEL LYMPH NODE BIOPSY Left 03/29/2020   Procedure: WIDE LOCAL EXCISION, ADVANCED FLAP CLOSURE LEFT ARM MELANOMA DEFECT WITH SENTINEL LYMPH NODE MAPPING AND BIOPSY;  Surgeon: Aron Shoulders, MD;  Location: [facility];  Service: General;  Laterality: Left;   S/P cysto & stents for kidnedy stone 11/08 by Dr. Amiel     S/P ELap 1986 w/excision of leiomyoma @ GE junction, Meckle's divertic resected, & cholecystectomy     SHOULDER SURGERY  04/2005   left by Dr. Yvone     reports that he has never smoked. He has never been exposed to tobacco smoke. He has never used smokeless tobacco. He reports that he does not drink alcohol and does not use drugs.  Allergies[1]  Family History  Problem Relation Age of Onset   Heart attack Mother    Dementia Brother    Colon cancer Neg Hx    Prostate cancer Neg Hx     Prior to Admission medications  Medication Sig Start Date End Date Taking? Authorizing Provider  Accu-Chek Softclix Lancets lancets TEST BLOOD SUGAR UP TO THREE TIMES DAILY 01/23/20   [name], MD  acetaminophen  (TYLENOL ) 500 MG tablet Take 2 tablets (1,000 mg total) by mouth 3 (three) times daily. 01/08/24   Arlice Reichert, MD  amLODipine  (NORVASC ) 10 MG tablet Take 1 tablet (10 mg total) by mouth every morning. 01/08/24   Arlice Reichert, MD  aspirin  EC 81 MG tablet Swallow whole. Take 1 daily after finishing eliquis . 01/26/24   [name], MD  Blood Glucose Calibration (ACCU-CHEK AVIVA) SOLN Use as directed monthly. E11.59  Insulin  dependent 12/06/18   [name], MD  Blood Glucose Monitoring Suppl (CONTOUR  BLOOD GLUCOSE SYSTEM) w/Device KIT 1 each by Does not apply route 3 (three) times daily. 11/09/23   [name], MD  carvedilol  (COREG ) 12.5 MG tablet TAKE 1 TABLET BY MOUTH TWICE  DAILY WITH MEALS 11/13/23   [name], MD  COD LIVER OIL PO Take 1 capsule by mouth 2 (two) times daily.     [provider]  cyanocobalamin  (VITAMIN B12) 1000 MCG tablet Take 1 tablet (1,000 mcg total) by mouth daily. 09/29/23   [name], MD  diclofenac  Sodium (VOLTAREN ) 1 % GEL Apply 2 g topically 4 (four) times daily. 06/11/23   Vann, Jessica U, DO  DROPLET PEN NEEDLES 30G X 8 MM MISC USE  WITH  INSULIN   PEN 02/03/19   [name], MD  empagliflozin  (JARDIANCE ) 10 MG TABS tablet Take  1 tablet (10 mg total) by mouth daily before breakfast. Bill secondary: BIN [id number], PCN [id number], GRP [id number], ID [id number] 10/09/23   [name], MD  escitalopram  (LEXAPRO ) 20 MG tablet Take 1 tablet (20 mg total) by mouth daily. 09/29/23   [name], MD  furosemide  (LASIX ) 40 MG tablet Take 1 tablet (40 mg total) by mouth daily. Can take an additional 20mg  as needed for weight gain of 2 lbs in a day or 5lbs in a week 02/08/24   [name], MD  hydrALAZINE  (APRESOLINE ) 50 MG tablet TAKE 1 TABLET BY MOUTH 3 TIMES  DAILY 11/13/23   [name], MD  insulin  aspart (NOVOLOG ) 100 UNIT/ML injection Inject 0-9 Units into the skin 3 (three) times daily with meals. 01/08/24   Arlice Reichert, MD  insulin  glargine-yfgn (SEMGLEE ) 100 UNIT/ML injection Inject 0.3 mLs (30 Units total) into the skin daily at 10 pm. 01/08/24   Dahal, Reichert, MD  losartan  (COZAAR ) 50 MG tablet Take 1 tablet (50 mg total) by mouth daily. 02/02/24   [name], MD  methocarbamol  (ROBAXIN ) 500 MG tablet Take 0.5 tablets (250 mg total) by mouth every 6 (six) hours as needed for muscle spasms. 01/26/24   [name], MD  [facility] ULTRA test strip USE TO CHECK UP TO 3 TIMES DAILY AS NEEDED 02/10/24   [name], MD   polyethylene glycol (MIRALAX  / GLYCOLAX ) 17 g packet Take 17 g by mouth daily as needed for moderate constipation. 01/08/24   Dahal, Reichert, MD  senna-docusate (SENOKOT-S) 8.6-50 MG tablet Take 1 tablet by mouth 2 (two) times daily. 01/08/24   Arlice Reichert, MD  tamsulosin  (FLOMAX ) 0.4 MG CAPS capsule Take 1 capsule (0.4 mg total) by mouth daily after supper. 01/08/24   Arlice Reichert, MD  traMADol  (ULTRAM ) 50 MG tablet Take 1-2 tablets (50-100 mg total) by mouth every 6 (six) hours as needed for moderate pain (pain score 4-6) or severe pain (pain score 7-10). 01/04/24   Danton Lauraine LABOR, PA-C    Physical Exam: Vitals:   02/27/24 1935 02/27/24 2115 02/27/24 2118 02/27/24 2302  BP:  (!) 185/86 (!) 185/86 (!) 179/82  Pulse:  60 65 70  Resp:  18 (!) 22 16  Temp: 98 F (36.7 C)   98.6 F (37 C)  TempSrc: Oral     SpO2:  96% 99% 97%    Constitutional: NAD, calm, comfortable Vitals:   02/27/24 1937 02/27/24 2115 02/27/24 2118 02/27/24 2302  BP:  (!) 185/86 (!) 185/86 (!) 179/82  Pulse:  60 65 70  Resp:  18 (!) 22 16  Temp: 98 F (36.7 C)   98.6 F (37 C)  TempSrc: Oral     SpO2:  96% 99% 97%   Eyes: PERRL, lids and conjunctivae normal ENMT: Mucous membranes are moist. Posterior pharynx clear of any exudate or lesions.Normal dentition.  Neck: normal, supple, no masses, no thyromegaly Respiratory: clear to auscultation bilaterally, no wheezing, no crackles. Normal respiratory effort. No accessory muscle use.  Cardiovascular: Regular rate and rhythm, no murmurs / rubs / gallops. No extremity edema. 2+ pedal pulses. No carotid bruits.  Abdomen: no tenderness, no masses palpated. No hepatosplenomegaly. Bowel sounds positive.  Musculoskeletal: no clubbing / cyanosis. No joint deformity upper and lower extremities. Good ROM, no contractures. Normal muscle tone.  Skin: no rashes, lesions, ulcers. No induration Neurologic: CN 2-12 grossly intact. Sensation intact, DTR normal. Strength 5/5 in  all 4.  Psychiatric:  Normal judgment and insight. Alert and oriented x 3. Normal mood.    Labs on Admission: I have personally reviewed following labs and imaging studies  CBC: Recent Labs  Lab 02/27/24 1708  WBC 8.4  NEUTROABS 5.3  HGB 12.8*  HCT 38.6*  MCV 91.5  PLT 311   Basic Metabolic Panel: Recent Labs  Lab 02/27/24 1708  NA 141  K 2.7*  CL 110  CO2 22  GLUCOSE 179*  BUN 20  CREATININE 0.99  CALCIUM  8.0*   GFR: CrCl cannot be calculated (Unknown ideal weight.). Liver Function Tests: Recent Labs  Lab 02/27/24 1708  AST 24  ALT 11  ALKPHOS 80  BILITOT 0.2  PROT 5.3*  ALBUMIN  3.1*   No results for input(s): LIPASE, AMYLASE in the last 168 hours. No results for input(s): AMMONIA in the last 168 hours. Coagulation Profile: No results for input(s): INR, PROTIME in the last 168 hours. Cardiac Enzymes: No results for input(s): CKTOTAL, CKMB, CKMBINDEX, TROPONINI in the last 168 hours. BNP (last 3 results) No results for input(s): PROBNP in the last 8760 hours. HbA1C: No results for input(s): HGBA1C in the last 72 hours. CBG: Recent Labs  Lab 02/27/24 1957  GLUCAP 219*   Lipid Profile: No results for input(s): CHOL, HDL, LDLCALC, TRIG, CHOLHDL, LDLDIRECT in the last 72 hours. Thyroid  Function Tests: No results for input(s): TSH, T4TOTAL, FREET4, T3FREE, THYROIDAB in the last 72 hours. Anemia Panel: No results for input(s): VITAMINB12, FOLATE, FERRITIN, TIBC, IRON, RETICCTPCT in the last 72 hours. Urine analysis:    Component Value Date/Time   COLORURINE STRAW (A) 06/08/2023 1448   APPEARANCEUR CLEAR 06/08/2023 1448   LABSPEC 1.009 06/08/2023 1448   PHURINE 8.0 06/08/2023 1448   GLUCOSEU NEGATIVE 06/08/2023 1448   HGBUR SMALL (A) 06/08/2023 1448   BILIRUBINUR NEGATIVE 06/08/2023 1448   KETONESUR NEGATIVE 06/08/2023 1448   PROTEINUR 100 (A) 06/08/2023 1448   UROBILINOGEN 0.2 02/02/2014 1509    NITRITE NEGATIVE 06/08/2023 1448   LEUKOCYTESUR SMALL (A) 06/08/2023 1448    Radiological Exams on Admission: CT Hip Left Wo Contrast Result Date: 02/27/2024 EXAM: CT OF THE LEFT HIP WITHOUT IV CONTRAST 02/27/2024 08:45:46 PM TECHNIQUE: CT of the left hip was performed without the administration of intravenous contrast. Multiplanar reformatted images are provided for review. Automated exposure control, iterative reconstruction, and/or weight based adjustment of the mA/kV was utilized to reduce the radiation dose to as low as reasonably achievable. COMPARISON: None available. CLINICAL HISTORY: Hip replacement, periprosthetic fracture suspected. FINDINGS: BONES: Old left femoral intertrochanteric fracture with extension into the proximal femoral shaft. Dynamic hip screw and femoral nail are in place across the fracture. Early evidence of healing within the femoral shaft component with callus formation. Otherwise, no significant evidence of healing. Fracture lines remain evident. Mild displacement of fracture fragments is present in the region of the proximal femoral shaft. No additional acute fracture seen. No aggressive appearing osseous abnormality or periostitis. SOFT TISSUE: Heterotopic bone formation is noted in the soft tissues medial to the proximal femoral shaft. No significant soft tissue edema or fluid collections. No soft tissue mass. JOINT: No subluxation or dislocation. No significant degenerative changes. No osseous erosions. INTRAPELVIC CONTENTS: Aortoiliac atherosclerosis is present. Large stool burden is noted in the colon. IMPRESSION: 1. Old left femoral intertrochanteric fracture with extension into the proximal femoral shaft, treated with dynamic hip screw and femoral nail, with early callus formation in the femoral shaft component but otherwise no significant healing. 2. Heterotopic bone  formation in the soft tissues medial to the proximal femoral shaft. Electronically signed by: Franky Crease MD 02/27/2024 09:01 PM EST RP Workstation: [id number]   CT PELVIS WO CONTRAST Result Date: 02/27/2024 EXAM: CT PELVIS, WITHOUT IV CONTRAST 02/27/2024 08:45:46 PM TECHNIQUE: Axial images were acquired through the pelvis without IV contrast. Reformatted images were reviewed. Automated exposure control, iterative reconstruction, and/or weight based adjustment of the mA/kV was utilized to reduce the radiation dose to as low as reasonably achievable. COMPARISON: None available. CLINICAL HISTORY: Pelvic fracture. FINDINGS: BONES: Old fracture is noted in the left femoral intertrochanteric region and extending into the proximal left femoral shaft. Dynamic hip screw and femoral nail noted in place. Early callus formation around the femoral shaft component of the fracture. Otherwise, no significant evidence of healing a fracture line remains evident. No acute fracture seen. JOINTS: No subluxation or dislocation. The joint spaces are normal. SOFT TISSUES: The soft tissues are unremarkable. INTRAPELVIC CONTENTS: Aortoiliac atherosclerosis. 2 mm nonobstructing stone in the lower pole of the left kidney. Large stool burden in the rectum could reflect fecal impaction. Prostate enlargement. IMPRESSION: 1. No acute fracture, subluxation, or dislocation. 2. Old fracture in the left femoral intertrochanteric region and proximal shaft with fixation hardware and early callus formation around the shaft component, without additional evidence of significant interval healing. 3. Large stool burden in the rectum, which could reflect fecal impaction. 4. Left lower pole nephrolithiasis 5. Aortoiliac atherosclerosis. Electronically signed by: Franky Crease MD 02/27/2024 08:55 PM EST RP Workstation: [id number]   CT Lumbar Spine Wo Contrast Result Date: 02/27/2024 EXAM: CT OF THE LUMBAR SPINE WITHOUT CONTRAST 02/27/2024 08:45:46 PM TECHNIQUE: CT of the lumbar spine was performed without the administration of intravenous contrast.  Multiplanar reformatted images are provided for review. Automated exposure control, iterative reconstruction, and/or weight based adjustment of the mA/kV was utilized to reduce the radiation dose to as low as reasonably achievable. COMPARISON: None available. CLINICAL HISTORY: Low back pain, increased fracture risk. FINDINGS: BONES AND ALIGNMENT: Normal vertebral body heights. Normal alignment. No suspicious bone lesion. Healing posterior left 11th rib fracture. DEGENERATIVE CHANGES: Diffuse degenerative disc disease with disc space narrowing and anterior spurring. Diffuse bilateral degenerative facet disease. SOFT TISSUES: No acute abnormality. IMPRESSION: 1. No acute osseous abnormality of the lumbar spine. 2. Healing posterior left 11th rib fracture. Electronically signed by: Franky Crease MD 02/27/2024 08:50 PM EST RP Workstation: [id number]   DG Knee Complete 4 Views Left Result Date: 02/27/2024 EXAM: 4 VIEW(S) XRAY OF THE LEFT KNEE 02/27/2024 05:30:52 PM COMPARISON: None available. CLINICAL HISTORY: fall, s/p orif 11/25 FINDINGS: BONES AND JOINTS: Left femoral hardware partially visualized in the distal femur. Advanced tricompartmental degenerative changes in the left knee. Small joint effusion. No acute fracture, subluxation, or dislocation. SOFT TISSUES: Diffuse vascular calcifications. IMPRESSION: 1. No acute fracture, subluxation, or dislocation. 2. Advanced tricompartmental degenerative changes in the left knee with small joint effusion. Electronically signed by: Franky Crease MD 02/27/2024 05:48 PM EST RP Workstation: [id number]   CT Cervical Spine Wo Contrast Result Date: 02/27/2024 EXAM: CT CERVICAL SPINE WITHOUT CONTRAST 02/27/2024 05:40:17 PM TECHNIQUE: CT of the cervical spine was performed without the administration of intravenous contrast. Multiplanar reformatted images are provided for review. Automated exposure control, iterative reconstruction, and/or weight based adjustment of the mA/kV was  utilized to reduce the radiation dose to as low as reasonably achievable. COMPARISON: 01/01/2024 CLINICAL HISTORY: Neck trauma (Age >= 65y) FINDINGS: BONES AND ALIGNMENT: No acute fracture or traumatic malalignment. DEGENERATIVE CHANGES:  Flowing anterior osteophytes compatible with diffuse idiopathic skeletal hyperostosis. Advanced diffuse bilateral degenerative facet disease. SOFT TISSUES: No prevertebral soft tissue swelling. IMPRESSION: 1. No evidence of acute cervical spine injury. 2. Diffuse idiopathic skeletal hyperostosis with advanced diffuse bilateral degenerative facet disease. Electronically signed by: Franky Crease MD 02/27/2024 05:47 PM EST RP Workstation: [id number]   CT Head Wo Contrast Result Date: 02/27/2024 EXAM: CT HEAD WITHOUT CONTRAST 02/27/2024 05:40:17 PM TECHNIQUE: CT of the head was performed without the administration of intravenous contrast. Automated exposure control, iterative reconstruction, and/or weight based adjustment of the mA/kV was utilized to reduce the radiation dose to as low as reasonably achievable. COMPARISON: None available. CLINICAL HISTORY: Head trauma, moderate-severe. FINDINGS: BRAIN AND VENTRICLES: Diffuse cerebral atrophy. No acute hemorrhage. No evidence of acute infarct. No hydrocephalus. No extra-axial collection. No mass effect or midline shift. ORBITS: No acute abnormality. SINUSES: No acute abnormality. SOFT TISSUES AND SKULL: No acute soft tissue abnormality. No skull fracture. IMPRESSION: 1. No acute intracranial abnormality. Electronically signed by: Franky Crease MD 02/27/2024 05:45 PM EST RP Workstation: [id number]   DG Hip Unilat With Pelvis 2-3 Views Left Result Date: 02/27/2024 EXAM: 2 or 3 VIEW(S) XRAY OF THE LEFT HIP 02/27/2024 05:30:52 PM COMPARISON: Prior study. CLINICAL HISTORY: fall, s/p orif 11/25 FINDINGS: BONES AND JOINTS: Changes of internal fixation are seen in the left femur. There is an AP and oblique displaced proximal left femoral  fracture, which is unchanged since the prior study with no clear significant healing. No acute fracture, subluxation, or dislocation. No malalignment. SOFT TISSUES: Unremarkable. IMPRESSION: 1. No acute fracture, subluxation, or dislocation. 2. Unchanged displaced proximal left femoral fracture with internal fixation, with no clear significant healing. Electronically signed by: Franky Crease MD 02/27/2024 05:44 PM EST RP Workstation: [id number]    EKG: Independently reviewed.   Assessment/Plan  Intractable left HIP pain with concern for occult fracture  -admit med surg  - MRI ordered of left hip  -PT/OT -supportive care with ultram , tylenol , robaxin   Chronic combined systolic/diastolic heart failure -well compensated  -will continue with coreg , lasix , losartan ,amlodipine , hydralazine , jardiance  once med rec has been completed   DMII -Iss/fs  -continue jardiance    CAD HLD -continue on asa - diet controlled HLD   BPH  -continue flomax    Hx of Left Kidney mass  - patient does not desire intervention at this time   DVT prophylaxis: heparin  Code Status: full/ as discussed per patient wishes in event of cardiac arrest  Family Communication:   SHERON [name] Blades, Emergency Contact [phone] (Mobile)   Disposition Plan: patient  expected to be admitted less than 2 midnights  Consults called: PT Admission status: med surg   Camila DELENA Ned MD [facility]   If 7PM-7AM, please contact night-coverage [email or web address] Password [facility]  02/27/2024, 11:07 PM       [1]  Allergies Allergen Reactions   Morphine  Nausea And Vomiting and Nausea Only    Can take with food and usually doesn't cause nausea   Atorvastatin      Intolerant - nausea/myalgias   Codeine Phosphate Nausea And Vomiting   Metformin  And Related Diarrhea   "

## 2024-02-27 NOTE — H&P (Incomplete)
 " History and Physical    [name] FMW:[id number] DOB: 27-Mar-1934 DOA: 02/27/2024  PCP: [name], MD  Patient coming from: home  I have personally briefly reviewed patient's old medical records in [facility]  Chief Complaint: fall 1-2 days ago missed chair and fell to floor no with left hip pain  HPI: [name] is a 89 y.o. male with medical history significant of DM2, HTN, HLD, CAD, diverticulosis, BPH, anxiety, renal mass, hx of mechanical fall and left hip fracture s/p repair  01/02/2024 s/p which he was discharged to snf. Patient at home for few weeks noted to be weak but able to ambulate with walker, but had falls at home. Patient however around 1-2 days ago was attempting to sit on chair and missed chair and fell injuring his left hip. Patient notes pain worse with sitting and better with standing. Due to this patient has had difficulty with ambulation at home. Patient was brought in by family due to increasing pain and difficulty with ambulation and generalized weakness.    ED Course:  Afeb bp 157/81, hr 68, rr 22 , sat 96%  on 2L  Wbc 8.4, hgb 12.8, plt 311 Na 141, K 2.7, Cl 110, bicarb 22, glu 179, cr 0.99 Cxr IMPRESSION: 1. No acute fracture, subluxation, or dislocation. 2. Advanced tricompartmental degenerative changes in the left knee with small joint effusion.  CT hip IMPRESSION: 1. No acute fracture, subluxation, or dislocation. 2. Unchanged displaced proximal left femoral fracture with internal fixation, with no clear significant healing.  EKG: Nsr , first degree A-V block      Review of Systems: As per HPI otherwise 10 point review of systems negative.   Past Medical History:  Diagnosis Date   Anxiety    Borderline hyperlipidemia    CAD (coronary artery disease)    Colon polyps    Complication of anesthesia    trouble urinating after anesthesia   Diverticulosis of colon    DJD (degenerative joint disease)    DM  (diabetes mellitus) (HCC)    Adult onset  type 2   History of gastritis    History of kidney stones    surgery to remove   History of pyelonephritis    History of syncope    HTN (hypertension)    Hypertrophy of prostate with urinary obstruction and other lower urinary tract symptoms (LUTS)    Melanoma (HCC)    back of neck   Melanoma (HCC) 2022   per dermatology   Myoclonus    Pancreatitis due to biliary obstruction (HCC) several years ago   treated by Dr. Princella Nida    Past Surgical History:  Procedure Laterality Date   APPENDECTOMY     cartarized  nose blood vessel     left side   CATARACT EXTRACTION     RIGHT EYE   CHOLECYSTECTOMY     cystoscopy  04/17/2011   stent placed   CYSTOSCOPY W/ URETERAL STENT PLACEMENT  04/17/2011   Procedure: CYSTOSCOPY WITH RETROGRADE PYELOGRAM/URETERAL STENT PLACEMENT;  Surgeon: Thomasine Oiler, MD;  Location: [facility];  Service: Urology;  Laterality: Left;   CYSTOSCOPY WITH RETROGRADE PYELOGRAM, URETEROSCOPY AND STENT PLACEMENT Left 04/18/2013   Procedure: CYSTOSCOPY WITH LEFT RETROGRADE PYELOGRAM, LEFT URETEROSCOPY AND LASER LITHOTRIPSY LEFT STENT PLACEMENT;  Surgeon: Noretta Ferrara, MD;  Location: [facility];  Service: Urology;  Laterality: Left;   HOLMIUM LASER APPLICATION Left 04/18/2013   Procedure: HOLMIUM LASER APPLICATION;  Surgeon: Noretta Ferrara, MD;  Location: [facility];  Service: Urology;  Laterality: Left;   INCISION AND DRAINAGE ABSCESS Left 04/04/2020   Procedure: SELBY OF LEFT ARM HEMATOMA;  Surgeon: Aron Shoulders, MD;  Location: [facility];  Service: General;  Laterality: Left;  left   INTRAMEDULLARY (IM) NAIL INTERTROCHANTERIC Left 11/17/2023   Procedure: FIXATION, FRACTURE, INTERTROCHANTERIC, WITH INTRAMEDULLARY ROD;  Surgeon: Sherida Adine BROCKS, MD;  Location: [facility];  Service: Orthopedics;  Laterality: Left;   INTRAMEDULLARY (IM) NAIL INTERTROCHANTERIC Left 01/02/2024   Procedure: FIXATION, FRACTURE, INTERTROCHANTERIC, WITH  INTRAMEDULLARY ROD;  Surgeon: Kendal Franky SQUIBB, MD;  Location: [facility];  Service: Orthopedics;  Laterality: Left;   IRRIGATION AND DEBRIDEMENT ABSCESS Left 10/05/2014   Procedure: WILHEMENA D LEFT INDEX FINGER;  Surgeon: Balinda Rogue, MD;  Location: [facility];  Service: Plastics;  Laterality: Left;   KNEE SURGERY     bilateral ARTHROSCOPY X2 TO EACH KNEE   MELANOMA EXCISION  2018   MELANOMA EXCISION WITH SENTINEL LYMPH NODE BIOPSY Left 03/29/2020   Procedure: WIDE LOCAL EXCISION, ADVANCED FLAP CLOSURE LEFT ARM MELANOMA DEFECT WITH SENTINEL LYMPH NODE MAPPING AND BIOPSY;  Surgeon: Aron Shoulders, MD;  Location: [facility];  Service: General;  Laterality: Left;   S/P cysto & stents for kidnedy stone 11/08 by Dr. Amiel     S/P ELap 1986 w/excision of leiomyoma @ GE junction, Meckle's divertic resected, & cholecystectomy     SHOULDER SURGERY  04/2005   left by Dr. Yvone     reports that he has never smoked. He has never been exposed to tobacco smoke. He has never used smokeless tobacco. He reports that he does not drink alcohol and does not use drugs.  Allergies[1]  Family History  Problem Relation Age of Onset   Heart attack Mother    Dementia Brother    Colon cancer Neg Hx    Prostate cancer Neg Hx     Prior to Admission medications  Medication Sig Start Date End Date Taking? Authorizing Provider  Accu-Chek Softclix Lancets lancets TEST BLOOD SUGAR UP TO THREE TIMES DAILY 01/23/20   [name], MD  acetaminophen  (TYLENOL ) 500 MG tablet Take 2 tablets (1,000 mg total) by mouth 3 (three) times daily. 01/08/24   Arlice Reichert, MD  amLODipine  (NORVASC ) 10 MG tablet Take 1 tablet (10 mg total) by mouth every morning. 01/08/24   Arlice Reichert, MD  aspirin  EC 81 MG tablet Swallow whole. Take 1 daily after finishing eliquis . 01/26/24   [name], MD  Blood Glucose Calibration (ACCU-CHEK AVIVA) SOLN Use as directed monthly. E11.59  Insulin  dependent 12/06/18   [name], MD   Blood Glucose Monitoring Suppl (CONTOUR BLOOD GLUCOSE SYSTEM) w/Device KIT 1 each by Does not apply route 3 (three) times daily. 11/09/23   [name], MD  carvedilol  (COREG ) 12.5 MG tablet TAKE 1 TABLET BY MOUTH TWICE  DAILY WITH MEALS 11/13/23   [name], MD  COD LIVER OIL PO Take 1 capsule by mouth 2 (two) times daily.     [provider]  cyanocobalamin  (VITAMIN B12) 1000 MCG tablet Take 1 tablet (1,000 mcg total) by mouth daily. 09/29/23   [name], MD  diclofenac  Sodium (VOLTAREN ) 1 % GEL Apply 2 g topically 4 (four) times daily. 06/11/23   Vann, Jessica U, DO  DROPLET PEN NEEDLES 30G X 8 MM MISC USE  WITH  INSULIN   PEN 02/03/19   [name], MD  empagliflozin  (JARDIANCE ) 10 MG TABS tablet Take  1 tablet (10 mg total) by mouth daily before breakfast. Bill secondary: BIN [id number], PCN [id number], GRP [id number], ID [id number] 10/09/23   [name], MD  escitalopram  (LEXAPRO ) 20 MG tablet Take 1 tablet (20 mg total) by mouth daily. 09/29/23   [name], MD  furosemide  (LASIX ) 40 MG tablet Take 1 tablet (40 mg total) by mouth daily. Can take an additional 20mg  as needed for weight gain of 2 lbs in a day or 5lbs in a week 02/08/24   [name], MD  hydrALAZINE  (APRESOLINE ) 50 MG tablet TAKE 1 TABLET BY MOUTH 3 TIMES  DAILY 11/13/23   [name], MD  insulin  aspart (NOVOLOG ) 100 UNIT/ML injection Inject 0-9 Units into the skin 3 (three) times daily with meals. 01/08/24   Arlice Reichert, MD  insulin  glargine-yfgn (SEMGLEE ) 100 UNIT/ML injection Inject 0.3 mLs (30 Units total) into the skin daily at 10 pm. 01/08/24   Dahal, Reichert, MD  losartan  (COZAAR ) 50 MG tablet Take 1 tablet (50 mg total) by mouth daily. 02/02/24   [name], MD  methocarbamol  (ROBAXIN ) 500 MG tablet Take 0.5 tablets (250 mg total) by mouth every 6 (six) hours as needed for muscle spasms. 01/26/24   [name], MD  [facility] ULTRA test strip USE TO CHECK UP TO 3 TIMES DAILY  AS NEEDED 02/10/24   [name], MD  polyethylene glycol (MIRALAX  / GLYCOLAX ) 17 g packet Take 17 g by mouth daily as needed for moderate constipation. 01/08/24   Dahal, Reichert, MD  senna-docusate (SENOKOT-S) 8.6-50 MG tablet Take 1 tablet by mouth 2 (two) times daily. 01/08/24   Arlice Reichert, MD  tamsulosin  (FLOMAX ) 0.4 MG CAPS capsule Take 1 capsule (0.4 mg total) by mouth daily after supper. 01/08/24   Arlice Reichert, MD  traMADol  (ULTRAM ) 50 MG tablet Take 1-2 tablets (50-100 mg total) by mouth every 6 (six) hours as needed for moderate pain (pain score 4-6) or severe pain (pain score 7-10). 01/04/24   Danton Lauraine LABOR, PA-C    Physical Exam: Vitals:   02/27/24 1937 02/27/24 2115 02/27/24 2118 02/27/24 2302  BP:  (!) 185/86 (!) 185/86 (!) 179/82  Pulse:  60 65 70  Resp:  18 (!) 22 16  Temp: 98 F (36.7 C)   98.6 F (37 C)  TempSrc: Oral     SpO2:  96% 99% 97%    Constitutional: NAD, calm, comfortable Vitals:   02/27/24 1937 02/27/24 2115 02/27/24 2118 02/27/24 2302  BP:  (!) 185/86 (!) 185/86 (!) 179/82  Pulse:  60 65 70  Resp:  18 (!) 22 16  Temp: 98 F (36.7 C)   98.6 F (37 C)  TempSrc: Oral     SpO2:  96% 99% 97%   Eyes: PERRL, lids and conjunctivae normal ENMT: Mucous membranes are moist. Posterior pharynx clear of any exudate or lesions.Normal dentition.  Neck: normal, supple, no masses, no thyromegaly Respiratory: clear to auscultation bilaterally, no wheezing, no crackles. Normal respiratory effort. No accessory muscle use.  Cardiovascular: Regular rate and rhythm, no murmurs / rubs / gallops. No extremity edema. 2+ pedal pulses. No carotid bruits.  Abdomen: no tenderness, no masses palpated. No hepatosplenomegaly. Bowel sounds positive.  Musculoskeletal: no clubbing / cyanosis. No joint deformity upper and lower extremities. Good ROM, no contractures. Normal muscle tone.  Skin: no rashes, lesions, ulcers. No induration Neurologic: CN 2-12 grossly intact.  Sensation intact, DTR normal. Strength 5/5 in all 4.  Psychiatric:  Normal judgment and insight. Alert and oriented x 3. Normal mood.    Labs on Admission: I have personally reviewed following labs and imaging studies  CBC: Recent Labs  Lab 02/27/24 1708  WBC 8.4  NEUTROABS 5.3  HGB 12.8*  HCT 38.6*  MCV 91.5  PLT 311   Basic Metabolic Panel: Recent Labs  Lab 02/27/24 1708  NA 141  K 2.7*  CL 110  CO2 22  GLUCOSE 179*  BUN 20  CREATININE 0.99  CALCIUM  8.0*   GFR: CrCl cannot be calculated (Unknown ideal weight.). Liver Function Tests: Recent Labs  Lab 02/27/24 1708  AST 24  ALT 11  ALKPHOS 80  BILITOT 0.2  PROT 5.3*  ALBUMIN  3.1*   No results for input(s): LIPASE, AMYLASE in the last 168 hours. No results for input(s): AMMONIA in the last 168 hours. Coagulation Profile: No results for input(s): INR, PROTIME in the last 168 hours. Cardiac Enzymes: No results for input(s): CKTOTAL, CKMB, CKMBINDEX, TROPONINI in the last 168 hours. BNP (last 3 results) No results for input(s): PROBNP in the last 8760 hours. HbA1C: No results for input(s): HGBA1C in the last 72 hours. CBG: Recent Labs  Lab 02/27/24 1957  GLUCAP 219*   Lipid Profile: No results for input(s): CHOL, HDL, LDLCALC, TRIG, CHOLHDL, LDLDIRECT in the last 72 hours. Thyroid  Function Tests: No results for input(s): TSH, T4TOTAL, FREET4, T3FREE, THYROIDAB in the last 72 hours. Anemia Panel: No results for input(s): VITAMINB12, FOLATE, FERRITIN, TIBC, IRON, RETICCTPCT in the last 72 hours. Urine analysis:    Component Value Date/Time   COLORURINE STRAW (A) 06/08/2023 1448   APPEARANCEUR CLEAR 06/08/2023 1448   LABSPEC 1.009 06/08/2023 1448   PHURINE 8.0 06/08/2023 1448   GLUCOSEU NEGATIVE 06/08/2023 1448   HGBUR SMALL (A) 06/08/2023 1448   BILIRUBINUR NEGATIVE 06/08/2023 1448   KETONESUR NEGATIVE 06/08/2023 1448   PROTEINUR 100 (A)  06/08/2023 1448   UROBILINOGEN 0.2 02/02/2014 1509   NITRITE NEGATIVE 06/08/2023 1448   LEUKOCYTESUR SMALL (A) 06/08/2023 1448    Radiological Exams on Admission: CT Hip Left Wo Contrast Result Date: 02/27/2024 EXAM: CT OF THE LEFT HIP WITHOUT IV CONTRAST 02/27/2024 08:45:46 PM TECHNIQUE: CT of the left hip was performed without the administration of intravenous contrast. Multiplanar reformatted images are provided for review. Automated exposure control, iterative reconstruction, and/or weight based adjustment of the mA/kV was utilized to reduce the radiation dose to as low as reasonably achievable. COMPARISON: None available. CLINICAL HISTORY: Hip replacement, periprosthetic fracture suspected. FINDINGS: BONES: Old left femoral intertrochanteric fracture with extension into the proximal femoral shaft. Dynamic hip screw and femoral nail are in place across the fracture. Early evidence of healing within the femoral shaft component with callus formation. Otherwise, no significant evidence of healing. Fracture lines remain evident. Mild displacement of fracture fragments is present in the region of the proximal femoral shaft. No additional acute fracture seen. No aggressive appearing osseous abnormality or periostitis. SOFT TISSUE: Heterotopic bone formation is noted in the soft tissues medial to the proximal femoral shaft. No significant soft tissue edema or fluid collections. No soft tissue mass. JOINT: No subluxation or dislocation. No significant degenerative changes. No osseous erosions. INTRAPELVIC CONTENTS: Aortoiliac atherosclerosis is present. Large stool burden is noted in the colon. IMPRESSION: 1. Old left femoral intertrochanteric fracture with extension into the proximal femoral shaft, treated with dynamic hip screw and femoral nail, with early callus formation in the femoral shaft component but otherwise no significant healing. 2. Heterotopic bone  formation in the soft tissues medial to the  proximal femoral shaft. Electronically signed by: Franky Crease MD 02/27/2024 09:01 PM EST RP Workstation: [id number]   CT PELVIS WO CONTRAST Result Date: 02/27/2024 EXAM: CT PELVIS, WITHOUT IV CONTRAST 02/27/2024 08:45:46 PM TECHNIQUE: Axial images were acquired through the pelvis without IV contrast. Reformatted images were reviewed. Automated exposure control, iterative reconstruction, and/or weight based adjustment of the mA/kV was utilized to reduce the radiation dose to as low as reasonably achievable. COMPARISON: None available. CLINICAL HISTORY: Pelvic fracture. FINDINGS: BONES: Old fracture is noted in the left femoral intertrochanteric region and extending into the proximal left femoral shaft. Dynamic hip screw and femoral nail noted in place. Early callus formation around the femoral shaft component of the fracture. Otherwise, no significant evidence of healing a fracture line remains evident. No acute fracture seen. JOINTS: No subluxation or dislocation. The joint spaces are normal. SOFT TISSUES: The soft tissues are unremarkable. INTRAPELVIC CONTENTS: Aortoiliac atherosclerosis. 2 mm nonobstructing stone in the lower pole of the left kidney. Large stool burden in the rectum could reflect fecal impaction. Prostate enlargement. IMPRESSION: 1. No acute fracture, subluxation, or dislocation. 2. Old fracture in the left femoral intertrochanteric region and proximal shaft with fixation hardware and early callus formation around the shaft component, without additional evidence of significant interval healing. 3. Large stool burden in the rectum, which could reflect fecal impaction. 4. Left lower pole nephrolithiasis 5. Aortoiliac atherosclerosis. Electronically signed by: Franky Crease MD 02/27/2024 08:55 PM EST RP Workstation: [id number]   CT Lumbar Spine Wo Contrast Result Date: 02/27/2024 EXAM: CT OF THE LUMBAR SPINE WITHOUT CONTRAST 02/27/2024 08:45:46 PM TECHNIQUE: CT of the lumbar spine was performed  without the administration of intravenous contrast. Multiplanar reformatted images are provided for review. Automated exposure control, iterative reconstruction, and/or weight based adjustment of the mA/kV was utilized to reduce the radiation dose to as low as reasonably achievable. COMPARISON: None available. CLINICAL HISTORY: Low back pain, increased fracture risk. FINDINGS: BONES AND ALIGNMENT: Normal vertebral body heights. Normal alignment. No suspicious bone lesion. Healing posterior left 11th rib fracture. DEGENERATIVE CHANGES: Diffuse degenerative disc disease with disc space narrowing and anterior spurring. Diffuse bilateral degenerative facet disease. SOFT TISSUES: No acute abnormality. IMPRESSION: 1. No acute osseous abnormality of the lumbar spine. 2. Healing posterior left 11th rib fracture. Electronically signed by: Franky Crease MD 02/27/2024 08:50 PM EST RP Workstation: [id number]   DG Knee Complete 4 Views Left Result Date: 02/27/2024 EXAM: 4 VIEW(S) XRAY OF THE LEFT KNEE 02/27/2024 05:30:52 PM COMPARISON: None available. CLINICAL HISTORY: fall, s/p orif 11/25 FINDINGS: BONES AND JOINTS: Left femoral hardware partially visualized in the distal femur. Advanced tricompartmental degenerative changes in the left knee. Small joint effusion. No acute fracture, subluxation, or dislocation. SOFT TISSUES: Diffuse vascular calcifications. IMPRESSION: 1. No acute fracture, subluxation, or dislocation. 2. Advanced tricompartmental degenerative changes in the left knee with small joint effusion. Electronically signed by: Franky Crease MD 02/27/2024 05:48 PM EST RP Workstation: [id number]   CT Cervical Spine Wo Contrast Result Date: 02/27/2024 EXAM: CT CERVICAL SPINE WITHOUT CONTRAST 02/27/2024 05:40:17 PM TECHNIQUE: CT of the cervical spine was performed without the administration of intravenous contrast. Multiplanar reformatted images are provided for review. Automated exposure control, iterative  reconstruction, and/or weight based adjustment of the mA/kV was utilized to reduce the radiation dose to as low as reasonably achievable. COMPARISON: 01/01/2024 CLINICAL HISTORY: Neck trauma (Age >= 65y) FINDINGS: BONES AND ALIGNMENT: No acute fracture or traumatic malalignment. DEGENERATIVE CHANGES:  Flowing anterior osteophytes compatible with diffuse idiopathic skeletal hyperostosis. Advanced diffuse bilateral degenerative facet disease. SOFT TISSUES: No prevertebral soft tissue swelling. IMPRESSION: 1. No evidence of acute cervical spine injury. 2. Diffuse idiopathic skeletal hyperostosis with advanced diffuse bilateral degenerative facet disease. Electronically signed by: Franky Crease MD 02/27/2024 05:47 PM EST RP Workstation: [id number]   CT Head Wo Contrast Result Date: 02/27/2024 EXAM: CT HEAD WITHOUT CONTRAST 02/27/2024 05:40:17 PM TECHNIQUE: CT of the head was performed without the administration of intravenous contrast. Automated exposure control, iterative reconstruction, and/or weight based adjustment of the mA/kV was utilized to reduce the radiation dose to as low as reasonably achievable. COMPARISON: None available. CLINICAL HISTORY: Head trauma, moderate-severe. FINDINGS: BRAIN AND VENTRICLES: Diffuse cerebral atrophy. No acute hemorrhage. No evidence of acute infarct. No hydrocephalus. No extra-axial collection. No mass effect or midline shift. ORBITS: No acute abnormality. SINUSES: No acute abnormality. SOFT TISSUES AND SKULL: No acute soft tissue abnormality. No skull fracture. IMPRESSION: 1. No acute intracranial abnormality. Electronically signed by: Franky Crease MD 02/27/2024 05:45 PM EST RP Workstation: [id number]   DG Hip Unilat With Pelvis 2-3 Views Left Result Date: 02/27/2024 EXAM: 2 or 3 VIEW(S) XRAY OF THE LEFT HIP 02/27/2024 05:30:52 PM COMPARISON: Prior study. CLINICAL HISTORY: fall, s/p orif 11/25 FINDINGS: BONES AND JOINTS: Changes of internal fixation are seen in the left femur.  There is an AP and oblique displaced proximal left femoral fracture, which is unchanged since the prior study with no clear significant healing. No acute fracture, subluxation, or dislocation. No malalignment. SOFT TISSUES: Unremarkable. IMPRESSION: 1. No acute fracture, subluxation, or dislocation. 2. Unchanged displaced proximal left femoral fracture with internal fixation, with no clear significant healing. Electronically signed by: Franky Crease MD 02/27/2024 05:44 PM EST RP Workstation: [id number]    EKG: Independently reviewed.   Assessment/Plan  Intractable left HIP pain with concern for occult fracture  -admit med surg  - MRI ordered of left hip  -PT/OT -supportive care with ultram , tylenol , robaxin   Chronic combined systolic/diastolic heart failure -well compensated  -will continue with coreg , lasix , losartan ,amlodipine , hydralazine , jardiance  once med rec has been completed   DMII -Iss/fs  -continue jardiance   Hypokalemia -replete prn  -check mag   CAD HLD -continue on asa - diet controlled HLD   BPH  -continue flomax    Hx of Left Kidney mass  - patient does not desire intervention at this time   DVT prophylaxis: heparin  Code Status: full/ as discussed per patient wishes in event of cardiac arrest  Family Communication:   SHERON [name] Blades, Emergency Contact [phone] (Mobile)   Disposition Plan: patient  expected to be admitted less than 2 midnights  Consults called: PT Admission status: med surg   Camila DELENA Ned MD [facility]   If 7PM-7AM, please contact night-coverage [email or web address] Password [facility]  02/27/2024, 11:07 PM          [1] Allergies Allergen Reactions   Morphine  Nausea And Vomiting and Nausea Only    Can take with food and usually doesn't cause nausea   Atorvastatin      Intolerant - nausea/myalgias   Codeine Phosphate Nausea And Vomiting   Metformin  And Related Diarrhea  "

## 2024-02-28 ENCOUNTER — Other Ambulatory Visit: Payer: Self-pay

## 2024-02-28 ENCOUNTER — Inpatient Hospital Stay (HOSPITAL_COMMUNITY)

## 2024-02-28 DIAGNOSIS — M25552 Pain in left hip: Secondary | ICD-10-CM

## 2024-02-28 LAB — CBC WITH DIFFERENTIAL/PLATELET
Abs Immature Granulocytes: 0.02 K/uL (ref 0.00–0.07)
Basophils Absolute: 0 K/uL (ref 0.0–0.1)
Basophils Relative: 1 %
Eosinophils Absolute: 0.4 K/uL (ref 0.0–0.5)
Eosinophils Relative: 6 %
HCT: 39.1 % (ref 39.0–52.0)
Hemoglobin: 12.8 g/dL — ABNORMAL LOW (ref 13.0–17.0)
Immature Granulocytes: 0 %
Lymphocytes Relative: 35 %
Lymphs Abs: 2.4 K/uL (ref 0.7–4.0)
MCH: 29.8 pg (ref 26.0–34.0)
MCHC: 32.7 g/dL (ref 30.0–36.0)
MCV: 90.9 fL (ref 80.0–100.0)
Monocytes Absolute: 0.6 K/uL (ref 0.1–1.0)
Monocytes Relative: 9 %
Neutro Abs: 3.4 K/uL (ref 1.7–7.7)
Neutrophils Relative %: 49 %
Platelets: 324 K/uL (ref 150–400)
RBC: 4.3 MIL/uL (ref 4.22–5.81)
RDW: 14.7 % (ref 11.5–15.5)
WBC: 6.8 K/uL (ref 4.0–10.5)
nRBC: 0 % (ref 0.0–0.2)

## 2024-02-28 LAB — COMPREHENSIVE METABOLIC PANEL WITH GFR
ALT: 11 U/L (ref 0–44)
ALT: 19 U/L (ref 0–44)
AST: 16 U/L (ref 15–41)
AST: 22 U/L (ref 15–41)
Albumin: 3.7 g/dL (ref 3.5–5.0)
Albumin: 3.9 g/dL (ref 3.5–5.0)
Alkaline Phosphatase: 101 U/L (ref 38–126)
Alkaline Phosphatase: 95 U/L (ref 38–126)
Anion gap: 13 (ref 5–15)
Anion gap: 8 (ref 5–15)
BUN: 19 mg/dL (ref 8–23)
BUN: 20 mg/dL (ref 8–23)
CO2: 23 mmol/L (ref 22–32)
CO2: 26 mmol/L (ref 22–32)
Calcium: 10.3 mg/dL (ref 8.9–10.3)
Calcium: 9.5 mg/dL (ref 8.9–10.3)
Chloride: 101 mmol/L (ref 98–111)
Chloride: 102 mmol/L (ref 98–111)
Creatinine, Ser: 1.13 mg/dL (ref 0.61–1.24)
Creatinine, Ser: 1.15 mg/dL (ref 0.61–1.24)
GFR, Estimated: >60 mL/min
GFR, Estimated: >60 mL/min
Glucose, Bld: 220 mg/dL — ABNORMAL HIGH (ref 70–99)
Glucose, Bld: 245 mg/dL — ABNORMAL HIGH (ref 70–99)
Potassium: 3.2 mmol/L — ABNORMAL LOW (ref 3.5–5.1)
Potassium: 3.3 mmol/L — ABNORMAL LOW (ref 3.5–5.1)
Sodium: 136 mmol/L (ref 135–145)
Sodium: 137 mmol/L (ref 135–145)
Total Bilirubin: 0.2 mg/dL (ref 0.0–1.2)
Total Bilirubin: 0.2 mg/dL (ref 0.0–1.2)
Total Protein: 6 g/dL — ABNORMAL LOW (ref 6.5–8.1)
Total Protein: 6.4 g/dL — ABNORMAL LOW (ref 6.5–8.1)

## 2024-02-28 LAB — CBC
HCT: 41.2 % (ref 39.0–52.0)
Hemoglobin: 13.6 g/dL (ref 13.0–17.0)
MCH: 30.4 pg (ref 26.0–34.0)
MCHC: 33 g/dL (ref 30.0–36.0)
MCV: 92 fL (ref 80.0–100.0)
Platelets: 328 K/uL (ref 150–400)
RBC: 4.48 MIL/uL (ref 4.22–5.81)
RDW: 14.7 % (ref 11.5–15.5)
WBC: 7.7 K/uL (ref 4.0–10.5)
nRBC: 0 % (ref 0.0–0.2)

## 2024-02-28 LAB — URINALYSIS, ROUTINE W REFLEX MICROSCOPIC
Bilirubin Urine: NEGATIVE
Bilirubin Urine: NEGATIVE
Glucose, UA: >=500 mg/dL — AB
Glucose, UA: >=500 mg/dL — AB
Ketones, ur: NEGATIVE mg/dL
Ketones, ur: NEGATIVE mg/dL
Nitrite: NEGATIVE
Nitrite: NEGATIVE
Protein, ur: >=300 mg/dL — AB
Protein, ur: 100 mg/dL — AB
Specific Gravity, Urine: 1.02 (ref 1.005–1.030)
Specific Gravity, Urine: 1.014 (ref 1.005–1.030)
WBC, UA: >50 WBC/hpf (ref 0–5)
WBC, UA: >50 WBC/hpf (ref 0–5)
pH: 6 (ref 5.0–8.0)
pH: 5 (ref 5.0–8.0)

## 2024-02-28 LAB — GLUCOSE, CAPILLARY
Glucose-Capillary: 145 mg/dL — ABNORMAL HIGH (ref 70–99)
Glucose-Capillary: 175 mg/dL — ABNORMAL HIGH (ref 70–99)
Glucose-Capillary: 188 mg/dL — ABNORMAL HIGH (ref 70–99)
Glucose-Capillary: 191 mg/dL — ABNORMAL HIGH (ref 70–99)

## 2024-02-28 LAB — CREATININE, SERUM
Creatinine, Ser: 1.08 mg/dL (ref 0.61–1.24)
GFR, Estimated: >60 mL/min

## 2024-02-28 LAB — TSH: TSH: 4.79 u[IU]/mL — ABNORMAL HIGH (ref 0.350–4.500)

## 2024-02-28 LAB — HEMOGLOBIN A1C
Hgb A1c MFr Bld: 8.7 % — ABNORMAL HIGH (ref 4.8–5.6)
Mean Plasma Glucose: 202.99 mg/dL

## 2024-02-28 LAB — MAGNESIUM: Magnesium: 2.1 mg/dL (ref 1.7–2.4)

## 2024-02-28 MED ORDER — POLYETHYLENE GLYCOL 3350 17 G PO PACK
17.0000 g | PACK | Freq: Two times a day (BID) | ORAL | Status: AC
  Administered 2024-02-28 – 2024-02-29 (×4): 17 g via ORAL
  Filled 2024-02-28 (×4): qty 1

## 2024-02-28 MED ORDER — METHOCARBAMOL 500 MG PO TABS
750.0000 mg | ORAL_TABLET | Freq: Four times a day (QID) | ORAL | Status: DC
  Administered 2024-02-28: 750 mg via ORAL
  Filled 2024-02-28: qty 2

## 2024-02-28 MED ORDER — HYDRALAZINE HCL 50 MG PO TABS
50.0000 mg | ORAL_TABLET | Freq: Three times a day (TID) | ORAL | Status: DC
  Administered 2024-02-28 – 2024-03-02 (×9): 50 mg via ORAL
  Filled 2024-02-28 (×10): qty 1

## 2024-02-28 MED ORDER — SENNOSIDES-DOCUSATE SODIUM 8.6-50 MG PO TABS
1.0000 | ORAL_TABLET | Freq: Every day | ORAL | Status: DC
  Administered 2024-02-28 – 2024-03-01 (×3): 1 via ORAL
  Filled 2024-02-28 (×3): qty 1

## 2024-02-28 MED ORDER — INSULIN ASPART 100 UNIT/ML IJ SOLN
0.0000 [IU] | Freq: Three times a day (TID) | INTRAMUSCULAR | Status: DC
  Administered 2024-02-28: 1 [IU] via SUBCUTANEOUS
  Administered 2024-02-28 – 2024-03-01 (×4): 2 [IU] via SUBCUTANEOUS
  Administered 2024-03-01: 3 [IU] via SUBCUTANEOUS
  Administered 2024-03-02: 2 [IU] via SUBCUTANEOUS
  Administered 2024-03-02: 1 [IU] via SUBCUTANEOUS
  Filled 2024-02-28: qty 2
  Filled 2024-02-28: qty 1
  Filled 2024-02-28: qty 3
  Filled 2024-02-28 (×3): qty 2
  Filled 2024-02-28: qty 1
  Filled 2024-02-28: qty 2

## 2024-02-28 MED ORDER — GADOBUTROL 1 MMOL/ML IV SOLN
9.0000 mL | Freq: Once | INTRAVENOUS | Status: AC | PRN
  Administered 2024-02-28: 9 mL via INTRAVENOUS

## 2024-02-28 MED ORDER — MORPHINE SULFATE (PF) 2 MG/ML IV SOLN
2.0000 mg | INTRAVENOUS | Status: DC | PRN
  Administered 2024-02-28: 2 mg via INTRAVENOUS
  Filled 2024-02-28: qty 1

## 2024-02-28 MED ORDER — OXYCODONE HCL 5 MG PO TABS
10.0000 mg | ORAL_TABLET | ORAL | Status: DC | PRN
  Administered 2024-02-28 (×2): 10 mg via ORAL
  Filled 2024-02-28 (×2): qty 2

## 2024-02-28 MED ORDER — OXYCODONE HCL 5 MG PO TABS
15.0000 mg | ORAL_TABLET | ORAL | Status: DC | PRN
  Administered 2024-02-28: 15 mg via ORAL
  Filled 2024-02-28: qty 3

## 2024-02-28 MED ORDER — ACETAMINOPHEN 500 MG PO TABS: 500.0000 mg | ORAL_TABLET | ORAL | Status: DC

## 2024-02-28 MED ORDER — ACETAMINOPHEN 500 MG PO TABS
1000.0000 mg | ORAL_TABLET | Freq: Three times a day (TID) | ORAL | Status: DC
  Administered 2024-02-28 – 2024-03-01 (×7): 1000 mg via ORAL
  Filled 2024-02-28 (×7): qty 2

## 2024-02-28 NOTE — Telephone Encounter (Signed)
Noted. Thanks. Will see at OV.  

## 2024-02-28 NOTE — Progress Notes (Signed)
 PT Cancellation Note  Patient Details Name: [name] MRN: [id number] DOB: 11-19-1934   Cancelled Treatment:     PT order received but eval deferred.  RN advises pt in significant pain and MRI scheduled for this am, as well as possible orthopedic consult.  Will follow.   Earlisha Sharples 02/28/2024, 9:26 AM

## 2024-02-28 NOTE — Plan of Care (Signed)

## 2024-02-28 NOTE — Progress Notes (Signed)
 [facility]  [name] FMW:[id number]  DOB: Oct 14, 1934  DOA: 02/27/2024  PCP: [name], MD  02/28/2024,8:09 AM  LOS: 1 day    Code Status: Full code     from: Home  89 year old male home dwelling 11/21-11/28/2025 admitted for prosthetic left femoral shaft fracture status post repair placed on Eliquis  -Previously 11/2023 had IM nailing of intertrochanteric fracture-that admission complicated by anemia of acute blood loss with large abdominal wall hematoma and left sided 9th and 10th rib nondisplaced fractures Known combined systolic diastolic heart failure EF to 03/20/2023 35-40% with grade 2 dysfunction diastolic DM type II on insulin  Left kidney mass 5 cm upper pole supposed to be followed by Dr. Renda --- 1 obesity HTN Known history of tremors previously worked up felt to be either psychogenic nonepileptic seizures and was started on Lexapro  in 2024-remotely also seen in 2015 by neurology at [facility]  1/16 present from home with left hip pain worsening and due to new recent falls more off balance than usual-seem to have missed his chair when he tried to sit and fell onto his bottom has had left knee and hip pain but has been able to ambulate Sodium 141 potassium 2.7 BUN/creatinine 20/0.9 alk phos 80 AST/ALT 24/11--WBC 8.4 hemoglobin 12.8 platelet 311 TSH 4.7 A1c 8.7  CT hip shows old left intertrochanteric fracture with extension into proximal femoral shaft early callus formation and femoral shaft but otherwise no significant healing   Assessment  & Plan :   Left hip pain --in a settign of recent fall CT hip performed unclear and nonspecific-patient having discomfort in the left hip with basic ROM abduction adduction so MRI hip is performed and pending Home dose Tylenol  1000 Q8 resumed--continuing Norco 1-2 every 4 as needed, ibuprofen  400 every 6 as needed can use Robaxin  250 every 8 as needed spasm and lidocaine  patch 1 patch q. 24 Would continue aspirin  81 for now-previously was on  Eliquis  which appears to have been stopped Would avoid tramadol  in the setting of reported seizures  Stable chronic systolic diastolic heart failure last EF 35-40% Continues Coreg  12.5 twice daily, losartan  50, amlodipine  10, Lasix  orally 40 daily Add back home hydralazine  50 3 times daily as blood pressure not controlled  Diabetes mellitus type 2--A1c 8.7 this admission--Home meds Jardiance  10 NovoLog  sliding scale at home and 30 units long-acting CBGs ranging 145-219, continue mealtime sensitive coverage, long-acting 30 units and Jardiance  10 Monitor trends and add mealtime mealtime sliding scale if needed  Psychogenic nonepileptic seizures on Lexapro  Continues Lexapro  20  BPH  continue Flomax  0.4 at bedtime  Severe presbycusis Quite hard of hearing will contact son once I know more with regards to imaging and communicate with him  Left kidney mass 5 cm upper pole Patient is not aware of this diagnosis-son does not wish him to know out of fear of alarming him Outpatient discussion with urologist and primary physician  Data Reviewed today: Labs are pending  DVT prophylaxis: Heparin  [name] Dispo/Global plan: Inpatient pending MRI and discussion/decision about workup  Addendum-MRI of the left hip postop ORIF left femur susceptibility artifact alignment unchanged callus formation marginal no acute fracture-4 x 3 x 3 ovoid T2 hyperintense mass right iliopsoas proximal right thigh?  Enhancement?  Other  Called and updated son [name] [phone]    Subjective:   Coherent awake alert no distress looks fair feels well but is quite hard of hearing Having some difficulty straight leg raising his left hip off  the bed-also passive ROM is a little impaired He has no chest pain no fever He has not had any breakfast yet today  Objective + exam Vitals:   02/27/24 2302 02/28/24 0218 02/28/24 0447 02/28/24 0635  BP: (!) 179/82 (!) 158/90 (!) 181/83 (!) 161/76  Pulse: 70 68 64  63  Resp: 16 14 18    Temp: 98.6 F (37 C) 98.2 F (36.8 C) (!) 97.5 F (36.4 C)   TempSrc:  Oral Oral   SpO2: 97% 99% 97%   Weight:   93 kg   Height:   6' (1.829 m)    Filed Weights   02/28/24 0447  Weight: 93 kg     Examination: EOMI NCAT quite hard of hearing looks about stated age no icterus no pallor no wheeze no rales no rhonchi seems coherent can tell me he is in the hospital--S1-S2 no murmur no rub no gallop seems to be in sinus--abdomen is soft no rebound no guarding--musculoskeletal he is able to move his hip but has pain does not raise it above 10 to 12 degrees of the bed passive ROM is a little bit painful-passively abducting the hip causes some discomfort-I reviewed the scar on his left hip it looks clean there is no breakdown He has no edema Neuro otherwise is grossly intact   Scheduled Meds:  amLODipine   10 mg Oral q morning   aspirin  EC  81 mg Oral Daily   carvedilol   12.5 mg Oral BID WC   cyanocobalamin   1,000 mcg Oral Daily   empagliflozin   10 mg Oral QAC breakfast   escitalopram   20 mg Oral Daily   furosemide   40 mg Oral Daily   heparin   5,000 Units Subcutaneous Q8H   insulin  aspart  0-9 Units Subcutaneous TID WC   insulin  glargine-yfgn  30 Units Subcutaneous Q2200   lidocaine   1 patch Transdermal Q24H   losartan   50 mg Oral Daily   tamsulosin   0.4 mg Oral QPC supper   Continuous Infusions: albuterol , HYDROcodone -acetaminophen , ibuprofen , methocarbamol , ondansetron  **OR** ondansetron  (ZOFRAN ) IV, polyethylene glycol  I spent 75 minutes today before, during and after this patient interview and examination--reviewing pertinent data, coordinating the patient's care and in communication with the care team and other medical professionals  Colen Grimes, MD  [facility]

## 2024-02-29 ENCOUNTER — Encounter (HOSPITAL_COMMUNITY): Payer: Self-pay | Admitting: Internal Medicine

## 2024-02-29 DIAGNOSIS — M25552 Pain in left hip: Secondary | ICD-10-CM | POA: Diagnosis not present

## 2024-02-29 LAB — CBC WITH DIFFERENTIAL/PLATELET
Abs Immature Granulocytes: 0.02 K/uL (ref 0.00–0.07)
Basophils Absolute: 0 K/uL (ref 0.0–0.1)
Basophils Relative: 0 %
Eosinophils Absolute: 0.3 K/uL (ref 0.0–0.5)
Eosinophils Relative: 4 %
HCT: 38.8 % — ABNORMAL LOW (ref 39.0–52.0)
Hemoglobin: 12.7 g/dL — ABNORMAL LOW (ref 13.0–17.0)
Immature Granulocytes: 0 %
Lymphocytes Relative: 34 %
Lymphs Abs: 2.3 K/uL (ref 0.7–4.0)
MCH: 29.8 pg (ref 26.0–34.0)
MCHC: 32.7 g/dL (ref 30.0–36.0)
MCV: 91.1 fL (ref 80.0–100.0)
Monocytes Absolute: 0.6 K/uL (ref 0.1–1.0)
Monocytes Relative: 8 %
Neutro Abs: 3.6 K/uL (ref 1.7–7.7)
Neutrophils Relative %: 54 %
Platelets: 304 K/uL (ref 150–400)
RBC: 4.26 MIL/uL (ref 4.22–5.81)
RDW: 14.9 % (ref 11.5–15.5)
WBC: 6.8 K/uL (ref 4.0–10.5)
nRBC: 0 % (ref 0.0–0.2)

## 2024-02-29 LAB — COMPREHENSIVE METABOLIC PANEL WITH GFR
ALT: 762 U/L — ABNORMAL HIGH (ref 0–44)
AST: 1115 U/L — ABNORMAL HIGH (ref 15–41)
Albumin: 3.7 g/dL (ref 3.5–5.0)
Alkaline Phosphatase: 230 U/L — ABNORMAL HIGH (ref 38–126)
Anion gap: 10 (ref 5–15)
BUN: 22 mg/dL (ref 8–23)
CO2: 26 mmol/L (ref 22–32)
Calcium: 10.1 mg/dL (ref 8.9–10.3)
Chloride: 102 mmol/L (ref 98–111)
Creatinine, Ser: 1.29 mg/dL — ABNORMAL HIGH (ref 0.61–1.24)
GFR, Estimated: 54 mL/min — ABNORMAL LOW
Glucose, Bld: 86 mg/dL (ref 70–99)
Potassium: 3.2 mmol/L — ABNORMAL LOW (ref 3.5–5.1)
Sodium: 138 mmol/L (ref 135–145)
Total Bilirubin: 0.4 mg/dL (ref 0.0–1.2)
Total Protein: 6.1 g/dL — ABNORMAL LOW (ref 6.5–8.1)

## 2024-02-29 LAB — GLUCOSE, CAPILLARY
Glucose-Capillary: 99 mg/dL (ref 70–99)
Glucose-Capillary: 115 mg/dL — ABNORMAL HIGH (ref 70–99)
Glucose-Capillary: 151 mg/dL — ABNORMAL HIGH (ref 70–99)
Glucose-Capillary: 136 mg/dL — ABNORMAL HIGH (ref 70–99)

## 2024-02-29 MED ORDER — CHLORHEXIDINE GLUCONATE CLOTH 2 % EX PADS
6.0000 | MEDICATED_PAD | Freq: Every day | CUTANEOUS | Status: DC
  Administered 2024-02-29 – 2024-03-01 (×2): 6 via TOPICAL

## 2024-02-29 MED ORDER — SODIUM CHLORIDE 0.9 % IV SOLN
1.0000 g | INTRAVENOUS | Status: DC
  Administered 2024-02-29 – 2024-03-02 (×3): 1 g via INTRAVENOUS
  Filled 2024-02-29 (×3): qty 10

## 2024-02-29 MED ORDER — METOPROLOL TARTRATE 5 MG/5ML IV SOLN
5.0000 mg | Freq: Four times a day (QID) | INTRAVENOUS | Status: DC | PRN
  Filled 2024-02-29: qty 5

## 2024-02-29 MED ORDER — OXYCODONE HCL 5 MG PO TABS: 5.0000 mg | ORAL_TABLET | ORAL | Status: DC | PRN

## 2024-02-29 MED ORDER — METHOCARBAMOL 500 MG PO TABS
500.0000 mg | ORAL_TABLET | Freq: Four times a day (QID) | ORAL | Status: DC | PRN
  Administered 2024-03-01 – 2024-03-02 (×2): 500 mg via ORAL
  Filled 2024-02-29 (×2): qty 1

## 2024-02-29 NOTE — Telephone Encounter (Signed)
 SABRA

## 2024-02-29 NOTE — Progress Notes (Signed)
 [facility]  [name] FMW:[id number]  DOB: 02/09/1935  DOA: 02/27/2024  PCP: [name] Arlyss RAMAN, MD  02/29/2024,7:46 AM  LOS: 2 days    Code Status: Full code     from: Home  89 year old male home dwelling 11/21-11/28/2025 admitted for prosthetic left femoral shaft fracture status post repair placed on Eliquis  -Previously 11/2023 had IM nailing of intertrochanteric fracture-that admission complicated by anemia of acute blood loss with large abdominal wall hematoma and left sided 9th and 10th rib nondisplaced fractures Known combined systolic diastolic heart failure EF to 03/20/2023 35-40% with grade 2 dysfunction diastolic DM type II on insulin  Left kidney mass 5 cm upper pole supposed to be followed by Dr. Renda --- 1 obesity HTN Known history of tremors previously worked up felt to be either psychogenic nonepileptic seizures and was started on Lexapro  in 2024-remotely also seen in 2015 by neurology at [facility]  1/16 present from home with left hip pain worsening and due to new recent falls more off balance than usual-seem to have missed his chair when he tried to sit and fell onto his bottom has had left knee and hip pain but has been able to ambulate Sodium 141 potassium 2.7 BUN/creatinine 20/0.9 alk phos 80 AST/ALT 24/11--WBC 8.4 hemoglobin 12.8 platelet 311 TSH 4.7 A1c 8.7  CT hip shows old left intertrochanteric fracture with extension into proximal femoral shaft early callus formation and femoral shaft but otherwise no significant healing 1/18 MRI of the left hip postop ORIF left femur susceptibility artifact alignment unchanged callus formation marginal no acute fracture-4 x 3 x 3 ovoid T2 hyperintense mass right iliopsoas proximal right thigh?  Enhancement?  Other 1/18 p.m. Foley catheter placed    Assessment  & Plan :   Left hip pain --in a settign of recent fall-retrospectively secondary to retention CT hip concerning for possible poor union-MRI unchanged callus-on contralateral leg  findings as above Pain now much better controlled--seems to have had urinary retention pain is 2/10 Changed to ibuprofen  400 every 6 as needed cut back oxycodone  to 5 every 4 as needed-May keep morphine  2 mg every 3 as needed-cut back Robaxin  to 500 every 6 as needed Currently on aspirin  81 -previously was on Eliquis  which appears to have been stopped Would avoid tramadol  in the setting of reported seizures  Stable chronic systolic diastolic heart failure last EF 35-40% Continues Coreg  12.5 twice daily, losartan  50, amlodipine  10, Lasix  orally 40 daily---hydralazine  50 3 times daily Blood pressure currently still elevated so we will add as needed metoprolol  5 every 6 as needed  Diabetes mellitus type 2--A1c 8.7 this admission--Home meds Jardiance  10 NovoLog  sliding scale at home and 30 units long-acting CBGs ranging 98-1 90, continue mealtime sensitive coverage, long-acting 30 units and Jardiance  10 Remains stable at this time  Psychogenic nonepileptic seizures on Lexapro  Continues Lexapro  20  Acute urinary retention superimposed on chronic BPH Foley catheter appears to have been placed midnight 1/18 early morning 1/19 because of acute urinary retention probably from opiate/pain and it seems that the pain that he was describing was not concerning for hip pain but rather retention May trial voiding trial later today versus in morning continue Flomax  0.4 at bedtime  Severe presbycusis Quite hard of hearing will contact son once I know more with regards to imaging and communicate with him  Left kidney mass 5 cm upper pole Patient is not aware of this diagnosis-son does not wish him to know out of fear of alarming him Outpatient  discussion with urologist and primary physician  Data Reviewed today: Labs are pending  DVT prophylaxis: Heparin   Dispo/Global plan: Mobilize with therapy ensure is able to perform activities    Subjective:   Overall looks better-sleepy when I saw  him-awakens well quite hard of hearing-thick voice Tells me pain is 2/10 in left hip-no fever no chills no nausea Foley catheter in place now and he has no discomfort tells me pain was radiating from lower quadrant to left leg but that this is resolved No fevers no chills  Objective + exam Vitals:   02/28/24 1420 02/28/24 1738 02/28/24 2108 02/29/24 0548  BP: 138/64 (!) 181/83 (!) 156/89 (!) 147/72  Pulse: 72 83 85 70  Resp: 16 16 19 16   Temp: 98.1 F (36.7 C) 97.9 F (36.6 C) 98.1 F (36.7 C) 98.6 F (37 C)  TempSrc:   Oral   SpO2: 95% 98% 94% 94%  Weight:      Height:       Filed Weights   02/28/24 0447  Weight: 93 kg     Examination:  EOMI NCAT no focal deficit no icterus no pallor no wheeze no rales no rhonchi CTAB no added sound S1-S2 no murmur no rub no gallop seems to be in regular sinus rhythm Soft no rebound no guarding Foley catheter is in place power is 5/5 finger-nose-finger is intact he is able to straight leg raise on the left  Scheduled Meds:  acetaminophen   1,000 mg Oral TID   amLODipine   10 mg Oral q morning   aspirin  EC  81 mg Oral Daily   carvedilol   12.5 mg Oral BID WC   cyanocobalamin   1,000 mcg Oral Daily   empagliflozin   10 mg Oral QAC breakfast   escitalopram   20 mg Oral Daily   furosemide   40 mg Oral Daily   heparin   5,000 Units Subcutaneous Q8H   hydrALAZINE   50 mg Oral TID   insulin  aspart  0-9 Units Subcutaneous TID WC   insulin  glargine-yfgn  30 Units Subcutaneous Q2200   lidocaine   1 patch Transdermal Q24H   losartan   50 mg Oral Daily   methocarbamol   750 mg Oral QID   polyethylene glycol  17 g Oral BID   senna-docusate  1 tablet Oral QHS   tamsulosin   0.4 mg Oral QPC supper   Continuous Infusions:  cefTRIAXone  (ROCEPHIN )  IV 1 g (02/29/24 0028)   albuterol , ibuprofen , morphine  injection, ondansetron  **OR** ondansetron  (ZOFRAN ) IV, oxyCODONE   I spent 35 minutes today before, during and after this patient interview and  examination--reviewing pertinent data, coordinating the patient's care and in communication with the patient's son [name] [phone]  [name] Grimes, MD  [facility]

## 2024-02-29 NOTE — Evaluation (Signed)
 Physical Therapy Evaluation Patient Details Name: [name] MRN: [id number] DOB: August 20, 1934 Today's Date: 02/29/2024  History of Present Illness  89 y/o  male after falls, L hip pain after returning home from SNF. MRI negative for acute fx.     PMH: L periprosthetic femoral shaft fx around previous IMN, s/p IMN on 01/02/24, anxiety, CAD, HLD, DM2 on insulin , nephrolithaisis, BPH  Clinical Impression  Pt admitted with above diagnosis.  Pt initially declined PT, agreeable with encouragement. Pt amb ~ 60' with RW and CGA. Pt demonstrates minimal carryover from prior therapies, is a significant fall risk and would benefit from supervision/assist at home. Pt states his son can stay with him-? Recommend pt resume HHPT at d/c   Pt currently with functional limitations due to the deficits listed below (see PT Problem List). Pt will benefit from acute skilled PT to increase their independence and safety with mobility to allow discharge.           If plan is discharge home, recommend the following: A little help with walking and/or transfers;A little help with bathing/dressing/bathroom;Help with stairs or ramp for entrance;Assistance with cooking/housework;Assist for transportation   Can travel by private vehicle        Equipment Recommendations None recommended by PT  Recommendations for Other Services       Functional Status Assessment Patient has had a recent decline in their functional status and demonstrates the ability to make significant improvements in function in a reasonable and predictable amount of time.     Precautions / Restrictions Precautions Precautions: Fall Restrictions Weight Bearing Restrictions Per Provider Order: No Other Position/Activity Restrictions: per secure chat ok to WBAT, was WBAT on prior admission with periprosthetic fx      Mobility  Bed Mobility Overal bed mobility: Needs Assistance Bed Mobility: Supine to Sit, Sit to Supine     Supine to  sit: Supervision, HOB elevated Sit to supine: HOB elevated, Supervision   General bed mobility comments: for safety    Transfers Overall transfer level: Needs assistance Equipment used: Rolling walker (2 wheels) Transfers: Sit to/from Stand Sit to Stand: Contact guard assist           General transfer comment: cues for safety (backing up to surface prior to sitting), bed elevated slightly    Ambulation/Gait Ambulation/Gait assistance: Contact guard assist Gait Distance (Feet): 65 Feet Assistive device: Rolling walker (2 wheels) Gait Pattern/deviations: Decreased step length - right, Decreased step length - left, Knee flexed in stance - left, Trunk flexed Gait velocity: decr     General Gait Details: cues for RW position, safety with turns. pt is a fall risk, demonstrates minimal carryover from prior therapies  Stairs            Wheelchair Mobility     Tilt Bed    Modified Rankin (Stroke Patients Only)       Balance Overall balance assessment: Needs assistance, History of Falls Sitting-balance support: Feet supported, No upper extremity supported Sitting balance-Leahy Scale: Fair Sitting balance - Comments: not challenged   Standing balance support: During functional activity, Reliant on assistive device for balance Standing balance-Leahy Scale: Poor                               Pertinent Vitals/Pain Pain Assessment Pain Assessment: No/denies pain    Home Living Family/patient expects to be discharged to:: Private residence Living Arrangements: Children Available Help at Discharge: Family;Available  PRN/intermittently Type of Home: House Home Access: Ramped entrance         Home Equipment: Agricultural [employer]ant (2 wheels);Cane - single point;BSC/3in1;Shower seat;Grab bars - toilet;Grab bars - tub/shower;Hand held shower head;Adaptive equipment;Lift chair Additional Comments: adjustable bed    Prior Function Prior Level of Function : Needs  assist             Mobility Comments: used RW mod I prior to recent fx/hospitalization/SNF. HHPT since d/c from SNF ADLs Comments: has assist for ADLs, able to self feed and perform grooming tasks (per prior notes)     Extremity/Trunk Assessment        Lower Extremity Assessment Lower Extremity Assessment: Generalized weakness       Communication        Cognition Arousal: Alert Behavior During Therapy: WFL for tasks assessed/performed   PT - Cognitive impairments: Difficult to assess, Safety/Judgement Difficult to assess due to: Hard of hearing/deaf                     PT - Cognition Comments: appropriate overall,  oriented x3, follows one step commands; poor safety awareness noted during mobility - requires frequent safety cues Following commands: Intact       Cueing Cueing Techniques: Verbal cues     General Comments      Exercises     Assessment/Plan    PT Assessment Patient needs continued PT services  PT Problem List Decreased strength;Decreased activity tolerance;Decreased balance;Decreased knowledge of use of DME;Decreased safety awareness       PT Treatment Interventions DME instruction;Therapeutic exercise;Gait training;Functional mobility training;Therapeutic activities;Patient/family education;Balance training    PT Goals (Current goals can be found in the Care Plan section)  Acute Rehab PT Goals Patient Stated Goal: home PT Goal Formulation: With patient Time For Goal Achievement: 03/07/24 Potential to Achieve Goals: Fair    Frequency Min 2X/week     Co-evaluation               AM-PAC PT 6 Clicks Mobility  Outcome Measure Help needed turning from your back to your side while in a flat bed without using bedrails?: A Little Help needed moving from lying on your back to sitting on the side of a flat bed without using bedrails?: A Little Help needed moving to and from a bed to a chair (including a wheelchair)?: A  Little Help needed standing up from a chair using your arms (e.g., wheelchair or bedside chair)?: A Little Help needed to walk in hospital room?: A Little Help needed climbing 3-5 steps with a railing? : A Little 6 Click Score: 18    End of Session Equipment Utilized During Treatment: Gait belt Activity Tolerance: Patient tolerated treatment well Patient left: with call bell/phone within reach;in bed;with bed alarm set   PT Visit Diagnosis: Other abnormalities of gait and mobility (R26.89);History of falling (Z91.81)    Time: 8845-8789 PT Time Calculation (min) (ACUTE ONLY): 16 min   Charges:   PT Evaluation $PT Eval Low Complexity: 1 Low   PT General Charges $$ ACUTE PT VISIT: 1 Visit         Lelar Farewell, PT  Acute Rehab Dept [facility]) [phone]  02/29/2024   [facility] 02/29/2024, 12:19 PM

## 2024-02-29 NOTE — Telephone Encounter (Signed)
 Per chart review tab pt seen and admitted to [facility]. Sending note to Dr Cleatus

## 2024-02-29 NOTE — Plan of Care (Signed)

## 2024-02-29 NOTE — Telephone Encounter (Signed)
 Noted, will await inpatient reports.  Thanks.

## 2024-03-01 ENCOUNTER — Ambulatory Visit: Admitting: Family Medicine

## 2024-03-01 ENCOUNTER — Telehealth: Payer: Self-pay

## 2024-03-01 DIAGNOSIS — M25552 Pain in left hip: Secondary | ICD-10-CM | POA: Diagnosis not present

## 2024-03-01 LAB — CBC WITH DIFFERENTIAL/PLATELET
Abs Immature Granulocytes: 0.02 K/uL (ref 0.00–0.07)
Basophils Absolute: 0.1 K/uL (ref 0.0–0.1)
Basophils Relative: 1 %
Eosinophils Absolute: 0.6 K/uL — ABNORMAL HIGH (ref 0.0–0.5)
Eosinophils Relative: 10 %
HCT: 39.9 % (ref 39.0–52.0)
Hemoglobin: 13.3 g/dL (ref 13.0–17.0)
Immature Granulocytes: 0 %
Lymphocytes Relative: 37 %
Lymphs Abs: 2.3 K/uL (ref 0.7–4.0)
MCH: 30.4 pg (ref 26.0–34.0)
MCHC: 33.3 g/dL (ref 30.0–36.0)
MCV: 91.3 fL (ref 80.0–100.0)
Monocytes Absolute: 0.6 K/uL (ref 0.1–1.0)
Monocytes Relative: 9 %
Neutro Abs: 2.7 K/uL (ref 1.7–7.7)
Neutrophils Relative %: 43 %
Platelets: 312 K/uL (ref 150–400)
RBC: 4.37 MIL/uL (ref 4.22–5.81)
RDW: 14.9 % (ref 11.5–15.5)
WBC: 6.2 K/uL (ref 4.0–10.5)
nRBC: 0 % (ref 0.0–0.2)

## 2024-03-01 LAB — COMPREHENSIVE METABOLIC PANEL WITH GFR
ALT: 394 U/L — ABNORMAL HIGH (ref 0–44)
AST: 159 U/L — ABNORMAL HIGH (ref 15–41)
Albumin: 3.7 g/dL (ref 3.5–5.0)
Alkaline Phosphatase: 187 U/L — ABNORMAL HIGH (ref 38–126)
Anion gap: 8 (ref 5–15)
BUN: 18 mg/dL (ref 8–23)
CO2: 28 mmol/L (ref 22–32)
Calcium: 9.9 mg/dL (ref 8.9–10.3)
Chloride: 104 mmol/L (ref 98–111)
Creatinine, Ser: 1.03 mg/dL (ref 0.61–1.24)
GFR, Estimated: >60 mL/min
Glucose, Bld: 90 mg/dL (ref 70–99)
Potassium: 3.2 mmol/L — ABNORMAL LOW (ref 3.5–5.1)
Sodium: 141 mmol/L (ref 135–145)
Total Bilirubin: 0.3 mg/dL (ref 0.0–1.2)
Total Protein: 6.3 g/dL — ABNORMAL LOW (ref 6.5–8.1)

## 2024-03-01 LAB — GLUCOSE, CAPILLARY
Glucose-Capillary: 87 mg/dL (ref 70–99)
Glucose-Capillary: 178 mg/dL — ABNORMAL HIGH (ref 70–99)
Glucose-Capillary: 223 mg/dL — ABNORMAL HIGH (ref 70–99)
Glucose-Capillary: 274 mg/dL — ABNORMAL HIGH (ref 70–99)

## 2024-03-01 LAB — PROTIME-INR
INR: 1 (ref 0.8–1.2)
Prothrombin Time: 13.4 s (ref 11.4–15.2)

## 2024-03-01 MED ORDER — POTASSIUM CHLORIDE CRYS ER 20 MEQ PO TBCR
40.0000 meq | EXTENDED_RELEASE_TABLET | Freq: Every day | ORAL | Status: DC
  Administered 2024-03-01 – 2024-03-02 (×2): 40 meq via ORAL
  Filled 2024-03-01 (×2): qty 2

## 2024-03-01 MED ORDER — ACETAMINOPHEN 500 MG PO TABS
500.0000 mg | ORAL_TABLET | Freq: Three times a day (TID) | ORAL | Status: DC
  Administered 2024-03-01 – 2024-03-02 (×3): 500 mg via ORAL
  Filled 2024-03-01 (×3): qty 1

## 2024-03-01 NOTE — Telephone Encounter (Signed)
 Noted, I will await the inpatient reports.  Thanks.

## 2024-03-01 NOTE — Progress Notes (Signed)
 Foley catheter was clamped per Drs order. Patient stated that he had sensation to urinate. Foley catheter was removed per order.

## 2024-03-01 NOTE — TOC Initial Note (Signed)
 Transition of Care [facility]) - Initial/Assessment Note    Patient Details  Name: [name] MRN: [id number] Date of Birth: 1934-03-24  Transition [facility] Care [facility]) CM/SW Contact:    NORMAN ASPEN, LCSW Phone Number: 03/01/2024, 3:32 PM  Clinical Narrative:                  Met with pt and spoke with son to review dc needs.  Son aware that PT has recommended dc home with resumption of HHPT services.  Pt and son are agreeable with this plan.  Son reports pt being followed by [facility] for [facility] and PT - will request new orders for resumption.  Pt has all needed DME.  Will remain available if any changes in dc plans.  Expected Discharge Plan: Home w Home Health Services Barriers to Discharge: Continued Medical Work up   Patient Goals and CMS Choice Patient states their goals for this hospitalization and ongoing recovery are:: return home          Expected Discharge Plan and Services In-house Referral: Clinical Social Work   Post Acute Care Choice: Home Health Living arrangements for the past 2 months: Single Family Home                 DME Arranged: N/A DME Agency: NA                  Prior Living Arrangements/Services Living arrangements for the past 2 months: Single Family Home Lives with:: Adult Children Patient language and need for interpreter reviewed:: Yes Do you feel safe going back to the place where you live?: Yes      Need for Family Participation in Patient Care: Yes (Comment) Care giver support system in place?: Yes (comment) Current home services: DME, Home PT, Home RN (RW, cane, bedside commode, tub seat) Criminal Activity/Legal Involvement Pertinent to Current Situation/Hospitalization: No - Comment as needed  Activities of Daily Living   ADL Screening (condition at time of admission) Independently performs ADLs?: No Does the patient have a NEW difficulty with bathing/dressing/toileting/self-feeding that is expected to last >3 days?: No Does the  patient have a NEW difficulty with getting in/out of bed, walking, or climbing stairs that is expected to last >3 days?: No Does the patient have a NEW difficulty with communication that is expected to last >3 days?: No Is the patient deaf or have difficulty hearing?: No Does the patient have difficulty seeing, even when wearing glasses/contacts?: No Does the patient have difficulty concentrating, remembering, or making decisions?: Yes  Permission Sought/Granted Permission sought to share information with : Family Supports Permission granted to share information with : Yes, Verbal Permission Granted  Share Information with NAME: son, Nina Hoar @ [phone]           Emotional Assessment Appearance:: Appears stated age Attitude/Demeanor/Rapport: Gracious Affect (typically observed): Accepting Orientation: : Oriented to Self, Oriented to Place, Oriented to  Time, Oriented to Situation Alcohol / Substance Use: Not Applicable Psych Involvement: No (comment)  Admission diagnosis:  Hip pain [M25.559] Left hip pain [M25.552] Patient Active Problem List   Diagnosis Date Noted   Hip pain 02/27/2024   Periprosthetic fracture of shaft of femur 01/27/2024   Closed left hip fracture, initial encounter (HCC) 01/02/2024   ABLA (acute blood loss anemia) 12/20/2023   Renal mass 12/20/2023   Trochanteric fracture of femur (HCC) 12/20/2023   Closed left hip fracture (HCC) 11/16/2023   Pseudohyponatremia 11/16/2023   History of pneumonia 09/30/2023  CHF (congestive heart failure) (HCC) 06/10/2023   Acute on chronic combined systolic and diastolic heart failure (HCC) 06/08/2023   Obesity, class 1 04/11/2023   Acute respiratory failure with hypoxia (HCC) 04/09/2023   Acute on chronic systolic CHF (congestive heart failure) (HCC) 04/09/2023   Psychogenic nonepileptic seizure 02/01/2023   Obesity (BMI 30-39.9) 01/23/2023   Memory change 07/31/2022   Drug-induced myopathy 07/28/2021    Tachycardia 01/09/2021   DNR (do not resuscitate) 04/13/2020   Hematoma 04/05/2020   Epigastric abdominal pain 04/01/2020   History of melanoma 2022   Medicare annual wellness visit, subsequent 01/03/2019   Recurrent Epistaxis 03/02/2017   Vitamin B 12 deficiency 08/17/2015   Elevated TSH 10/06/2014   AKI (acute kidney injury) 10/05/2014   Unsteady gait 09/18/2014   Midline low back pain without sciatica 09/18/2014   Tremor 05/04/2014   Chronic combined systolic and diastolic congestive heart failure (HCC) 01/27/2014   Ataxia 01/10/2014   UTI (urinary tract infection) 01/02/2014   History of coronary artery disease    Hypokalemia    Chest pain 12/15/2013   Healthcare maintenance 11/10/2013   Advance care planning 11/10/2013   ED (erectile dysfunction) 06/24/2013   HLD (hyperlipidemia) 03/22/2012   Type 2 diabetes mellitus with vascular disease (HCC) 03/22/2012   Thyroid  cyst 03/19/2011   BPH with obstruction/lower urinary tract symptoms 03/15/2010   GASTRITIS 02/21/2007   Anxiety 02/15/2007   CAD (coronary artery disease) 02/15/2007   DIVERTICULOSIS OF COLON 02/15/2007   COLONIC POLYPS 02/12/2007   Hypertension associated with diabetes (HCC) 02/12/2007   RENAL CALCULUS 02/12/2007   Osteoarthritis 02/12/2007   PCP:  [name], MD Pharmacy:   [facility] - [place], [place] - [address] [place] [address] Phone: [phone] Fax: [phone]  [facility]) - [place], [place] - [address] [place] [address] [place] [address] Phone: [phone] Fax: [phone]  [facility] - [place], [place] - [address] [place] [address] Phone: [phone] Fax: [phone]     Social Drivers of Health (SDOH) Social History: SDOH Screenings   Food Insecurity: No Food Insecurity (02/28/2024)  Housing: Low Risk (02/28/2024)   Transportation Needs: No Transportation Needs (02/28/2024)  Utilities: Not At Risk (02/28/2024)  Alcohol Screen: Low Risk (07/23/2023)  Depression (PHQ2-9): Low Risk (01/26/2024)  Financial Resource Strain: Low Risk (07/23/2023)  Physical Activity: Inactive (07/23/2023)  Social Connections: Moderately Isolated (02/28/2024)  Stress: No Stress Concern Present (07/23/2023)  Tobacco Use: Low Risk (02/29/2024)  Health Literacy: Inadequate Health Literacy (07/23/2023)   SDOH Interventions: Food Insecurity Interventions: Intervention Not Indicated Housing Interventions: Intervention Not Indicated Transportation Interventions: Intervention Not Indicated, Patient Resources (Friends/Family) Utilities Interventions: Intervention Not Indicated Social Connections Interventions: Intervention Not Indicated   Readmission Risk Interventions    03/01/2024    3:30 PM 11/24/2023    2:00 PM 04/17/2023   10:53 AM  Readmission Risk Prevention Plan  Transportation Screening Complete Complete Complete  PCP or Specialist Appt within 5-7 Days Complete  Complete  PCP or Specialist Appt within 3-5 Days  Complete   Home Care Screening Complete  Complete  Medication Review (RN CM) Complete  Complete  HRI or Home Care Consult  Complete   Social Work Consult for Recovery Care Planning/Counseling  Complete   Palliative Care Screening  Not Applicable   Medication Review Oceanographer)  Complete

## 2024-03-01 NOTE — Plan of Care (Signed)

## 2024-03-01 NOTE — Telephone Encounter (Signed)
 Copied from CRM #[id number]. Topic: Appointments - Scheduling Inquiry for Clinic >> Mar 01, 2024 10:27 AM Roselie BROCKS wrote: Reason for CRM: Patients son ,[name] , called about missing todays visit, 03-01-24. The patient is in the hospital with a kidney infection.  And wants Dr Cleatus to contact the patient, And to let Dr Cleatus know the hospital wants to change his medication.

## 2024-03-01 NOTE — Progress Notes (Signed)
 [name]  DAQUAN Mathews FMW:[id number]  DOB: 09-Jul-1934  DOA: 02/27/2024  PCP: [name], MD  03/01/2024,2:01 PM  LOS: 3 days    Code Status: Full code     from: Home  89 year old male home dwelling 11/21-11/28/2025 admitted for prosthetic left femoral shaft fracture status post repair placed on Eliquis  -Previously 11/2023 had IM nailing of intertrochanteric fracture-that admission complicated by anemia of acute blood loss with large abdominal wall hematoma and left sided 9th and 10th rib nondisplaced fractures Known combined systolic diastolic heart failure EF to 03/20/2023 35-40% with grade 2 dysfunction diastolic DM type II on insulin  Left kidney mass 5 cm upper pole supposed to be followed by Dr. Renda --- 1 obesity HTN Known history of tremors previously worked up felt to be either psychogenic nonepileptic seizures and was started on Lexapro  in 2024-remotely also seen in 2015 by neurology at [facility]  1/16 present from home with left hip pain worsening and due to new recent falls more off balance than usual-seem to have missed his chair when he tried to sit and fell onto his bottom has had left knee and hip pain but has been able to ambulate Sodium 141 potassium 2.7 BUN/creatinine 20/0.9 alk phos 80 AST/ALT 24/11--WBC 8.4 hemoglobin 12.8 platelet 311 TSH 4.7 A1c 8.7  CT hip shows old left intertrochanteric fracture with extension into proximal femoral shaft early callus formation and femoral shaft but otherwise no significant healing 1/18 MRI of the left hip postop ORIF left femur susceptibility artifact alignment unchanged callus formation marginal no acute fracture-4 x 3 x 3 ovoid T2 hyperintense mass right iliopsoas proximal right thigh?  Enhancement?  Other 1/18 p.m. Foley catheter placed    Assessment  & Plan :   Left hip pain --in a setting of recent fall-retrospectively secondary to retention CT hip concerning for possible poor union-MRI unchanged callus-on contralateral leg  findings as above Pain  better controlled--seems to have had urinary retention pain on 1/18 and resolved on 1/19 Changed to ibuprofen  400 every 6 prn- cut back oxycodone  to 5 every 4 prn-May keep morphine  2 mg every 3 prn--cut back Robaxin  to 500 every 6  prn-----Currently on aspirin  81 -previously was on Eliquis  which appears to have been stopped Would avoid tramadol  in the setting of reported seizures  Unexpected transaminitis Etiology not entirely clear--could have been from high amounts of tylenol  etc---seems to be trending downwards now,-cut back Tylenol  from thousand 3 times daily scheduled 500 3 times daily Trend over next day or so--if no pain and no new issues, would work-up no further  Stable chronic systolic diastolic heart failure last EF 35-40% Continues Coreg  12.5 twice daily, losartan  50, amlodipine  10, Lasix  orally 40 daily---hydralazine  50 3 times daily Blood pressure improved--on PRN metoprolol  5 every 6 as needed  Diabetes mellitus type 2--A1c 8.7 this admission--Home meds Jardiance  10 NovoLog  sliding scale at home and 30 units long-acting CBGs ranging 87-178, continue mealtime sensitive coverage, long-acting 30 units and Jardiance  10 Remains stable at this time  Psychogenic nonepileptic seizures on Lexapro  Continues Lexapro  20  Acute urinary retention superimposed on chronic BPH Foley catheter appears to have been placed midnight 1/18 early morning 1/19 because of acute urinary retention probably from opiate/pain and it seems that the pain that he was describing was not concerning for hip pain but rather retention Voiding trial today and if he fails he will need outpatient urology follow-up continue Flomax  0.4 at bedtime  Severe presbycusis Quite hard of hearing  will contact son once I know more with regards to imaging and communicate with him  Left kidney mass 5 cm upper pole Patient is not aware of this diagnosis-son does not wish him to know out of fear of alarming  him Outpatient discussion with urologist and primary physician  Data Reviewed today: AST/ALT 159/394 bilirubin 0.3 WBC 6.2 hemoglobin 13.3 platelet 312 INR 1.0 Potassium 3.2  DVT prophylaxis: Heparin   Dispo/Global plan: Mobilize w therapy-LFTs need to trend down-not ready for discharge also voiding trial  Son updated at length on phone 1/20   Subjective:   Awake coherent no distress looks well feels fair Drinking Had a stool No abdominal pain no nausea no vomiting  Objective + exam Vitals:   02/29/24 1331 02/29/24 1557 02/29/24 2152 03/01/24 0540  BP: (!) 167/66 103/82 (!) 184/89 (!) 132/96  Pulse: 67  67 73  Resp: 16  18 18   Temp: 98.4 F (36.9 C)  98.2 F (36.8 C) 98 F (36.7 C)  TempSrc: Oral  Oral Oral  SpO2: 95%  95% 94%  Weight:      Height:       Filed Weights   02/28/24 0447  Weight: 93 kg     Examination:   EOMI NCAT no focal deficit No icterus no pallor Chest is clear Abdomen soft slight distention no rebound no guarding no RUQ tenderness Power 5/5 Passive ROM to left hip intact  Scheduled Meds:  acetaminophen   1,000 mg Oral TID   amLODipine   10 mg Oral q morning   aspirin  EC  81 mg Oral Daily   carvedilol   12.5 mg Oral BID WC   Chlorhexidine  Gluconate Cloth  6 each Topical Daily   cyanocobalamin   1,000 mcg Oral Daily   empagliflozin   10 mg Oral QAC breakfast   escitalopram   20 mg Oral Daily   furosemide   40 mg Oral Daily   heparin   5,000 Units Subcutaneous Q8H   hydrALAZINE   50 mg Oral TID   insulin  aspart  0-9 Units Subcutaneous TID WC   insulin  glargine-yfgn  30 Units Subcutaneous Q2200   lidocaine   1 patch Transdermal Q24H   losartan   50 mg Oral Daily   potassium chloride   40 mEq Oral Daily   senna-docusate  1 tablet Oral QHS   tamsulosin   0.4 mg Oral QPC supper   Continuous Infusions:  cefTRIAXone  (ROCEPHIN )  IV 1 g (03/01/24 0200)   albuterol , ibuprofen , methocarbamol , metoprolol  tartrate, morphine  injection, ondansetron  **OR**  ondansetron  (ZOFRAN ) IV, oxyCODONE   I spent 35 minutes today before, during and after this patient interview and examination--reviewing pertinent data, coordinating the patient's care and in communication with the patient's son Orva Gwaltney [phone]  Colen Grimes, MD  [facility]

## 2024-03-02 ENCOUNTER — Other Ambulatory Visit (HOSPITAL_COMMUNITY): Payer: Self-pay

## 2024-03-02 DIAGNOSIS — M25552 Pain in left hip: Secondary | ICD-10-CM | POA: Diagnosis not present

## 2024-03-02 LAB — CBC WITH DIFFERENTIAL/PLATELET
Abs Immature Granulocytes: 0.02 K/uL (ref 0.00–0.07)
Basophils Absolute: 0 K/uL (ref 0.0–0.1)
Basophils Relative: 1 %
Eosinophils Absolute: 0.5 K/uL (ref 0.0–0.5)
Eosinophils Relative: 7 %
HCT: 39.5 % (ref 39.0–52.0)
Hemoglobin: 12.8 g/dL — ABNORMAL LOW (ref 13.0–17.0)
Immature Granulocytes: 0 %
Lymphocytes Relative: 36 %
Lymphs Abs: 2.3 K/uL (ref 0.7–4.0)
MCH: 29.8 pg (ref 26.0–34.0)
MCHC: 32.4 g/dL (ref 30.0–36.0)
MCV: 92.1 fL (ref 80.0–100.0)
Monocytes Absolute: 0.6 K/uL (ref 0.1–1.0)
Monocytes Relative: 9 %
Neutro Abs: 3 K/uL (ref 1.7–7.7)
Neutrophils Relative %: 47 %
Platelets: 318 K/uL (ref 150–400)
RBC: 4.29 MIL/uL (ref 4.22–5.81)
RDW: 15.1 % (ref 11.5–15.5)
WBC: 6.4 K/uL (ref 4.0–10.5)
nRBC: 0 % (ref 0.0–0.2)

## 2024-03-02 LAB — URINE CULTURE: Culture: >=100000 — AB

## 2024-03-02 LAB — COMPREHENSIVE METABOLIC PANEL WITH GFR
ALT: 261 U/L — ABNORMAL HIGH (ref 0–44)
AST: 54 U/L — ABNORMAL HIGH (ref 15–41)
Albumin: 3.4 g/dL — ABNORMAL LOW (ref 3.5–5.0)
Alkaline Phosphatase: 163 U/L — ABNORMAL HIGH (ref 38–126)
Anion gap: 12 (ref 5–15)
BUN: 22 mg/dL (ref 8–23)
CO2: 23 mmol/L (ref 22–32)
Calcium: 9.8 mg/dL (ref 8.9–10.3)
Chloride: 103 mmol/L (ref 98–111)
Creatinine, Ser: 1.25 mg/dL — ABNORMAL HIGH (ref 0.61–1.24)
GFR, Estimated: 56 mL/min — ABNORMAL LOW
Glucose, Bld: 271 mg/dL — ABNORMAL HIGH (ref 70–99)
Potassium: 3.6 mmol/L (ref 3.5–5.1)
Sodium: 138 mmol/L (ref 135–145)
Total Bilirubin: <0.2 mg/dL (ref 0.0–1.2)
Total Protein: 5.9 g/dL — ABNORMAL LOW (ref 6.5–8.1)

## 2024-03-02 LAB — GLUCOSE, CAPILLARY
Glucose-Capillary: 141 mg/dL — ABNORMAL HIGH (ref 70–99)
Glucose-Capillary: 186 mg/dL — ABNORMAL HIGH (ref 70–99)

## 2024-03-02 MED ORDER — SULFAMETHOXAZOLE-TRIMETHOPRIM 800-160 MG PO TABS
1.0000 | ORAL_TABLET | Freq: Two times a day (BID) | ORAL | 0 refills | Status: AC
  Filled 2024-03-02: qty 4, 2d supply, fill #0

## 2024-03-02 MED ORDER — TRAMADOL HCL 50 MG PO TABS
50.0000 mg | ORAL_TABLET | Freq: Four times a day (QID) | ORAL | 0 refills | Status: AC | PRN
  Filled 2024-03-02: qty 30, 4d supply, fill #0

## 2024-03-02 MED ORDER — ORAL CARE MOUTH RINSE: 15.0000 mL | OROMUCOSAL | Status: DC | PRN

## 2024-03-02 MED ORDER — ACETAMINOPHEN 500 MG PO TABS: 500.0000 mg | ORAL_TABLET | Freq: Four times a day (QID) | ORAL | Status: AC | PRN

## 2024-03-02 MED ORDER — LANTUS SOLOSTAR 100 UNIT/ML ~~LOC~~ SOPN
30.0000 [IU] | PEN_INJECTOR | Freq: Every day | SUBCUTANEOUS | 0 refills | Status: AC
  Filled 2024-03-02: qty 9, 30d supply, fill #0

## 2024-03-02 MED ORDER — PEN NEEDLES 31G X 5 MM MISC
1.0000 | 0 refills | Status: AC
  Filled 2024-03-02: qty 100, 25d supply, fill #0

## 2024-03-02 NOTE — Progress Notes (Signed)
 Discharge medications delivered to patient at the bedside in a secure bag.

## 2024-03-02 NOTE — TOC Transition Note (Signed)
 Transition of Care [facility]) - Discharge Note   Patient Details  Name: [name] MRN: [id number] Date of Birth: [birth year]  Transition of Care [facility]) CM/SW Contact:  [name] DELENA Saltness, LCSW Phone Number: 03/02/2024, 9:48 AM   Clinical Narrative:    Pt discharging home with [facility] PT/RN services through [facility]. Pt has needed DME. Pt and son, [name], in agreement with discharge plan. No further TOC needs at this time.   Final next level of care: Home w Home Health Services Barriers to Discharge: Barriers Resolved   Patient Goals and CMS Choice Patient states their goals for this hospitalization and ongoing recovery are:: return home        Discharge Placement  Home              Patient to be transferred to facility by: Son Name of family member notified: [name] Patient and family notified of of transfer: 03/02/24  Discharge Plan and Services Additional resources added to the After Visit Summary for  N/A In-house Referral: Clinical Social Work   Post Acute Care Choice: Home Health          DME Arranged: N/A DME Agency: NA       HH Arranged: PT, RN HH Agency: [facility] Date [facility] Agency Contacted: 03/02/24 Time HH Agency Contacted: [phone] Representative spoke with at [facility] Agency: Cindie  Social Drivers of Health (SDOH) Interventions SDOH Screenings   Food Insecurity: No Food Insecurity (02/28/2024)  Housing: Low Risk (02/28/2024)  Transportation Needs: No Transportation Needs (02/28/2024)  Utilities: Not At Risk (02/28/2024)  Alcohol Screen: Low Risk (07/23/2023)  Depression (PHQ2-9): Low Risk (01/26/2024)  Financial Resource Strain: Low Risk (07/23/2023)  Physical Activity: Inactive (07/23/2023)  Social Connections: Moderately Isolated (02/28/2024)  Stress: No Stress Concern Present (07/23/2023)  Tobacco Use: Low Risk (02/29/2024)  Health Literacy: Inadequate Health Literacy (07/23/2023)     Readmission Risk Interventions    03/01/2024    3:30  PM 11/24/2023    2:00 PM 04/17/2023   10:53 AM  Readmission Risk Prevention Plan  Transportation Screening Complete Complete Complete  PCP or Specialist Appt within 5-7 Days Complete  Complete  PCP or Specialist Appt within 3-5 Days  Complete   Home Care Screening Complete  Complete  Medication Review (RN CM) Complete  Complete  HRI or Home Care Consult  Complete   Social Work Consult for Recovery Care Planning/Counseling  Complete   Palliative Care Screening  Not Applicable   Medication Review Oceanographer)  Complete      Signed: Heather Mathews, MSW, LCSW Clinical Social Worker Inpatient Care Management 03/02/2024 9:49 AM

## 2024-03-02 NOTE — Care Management Important Message (Signed)
 Important Message  Patient Details IM Letter given. Name: BRICESON BROADWATER MRN: [id number] Date of Birth: 08/21/1934   Important Message Given:  Yes - Medicare IM     Noele Icenhour 03/02/2024, 12:36 PM

## 2024-03-02 NOTE — Progress Notes (Signed)
 Physical Therapy Treatment Patient Details Name: [name] MRN: [id number] DOB: October 06, 1934 Today's Date: 03/02/2024   History of Present Illness 89 y/o  male after falls, L hip pain after returning home from SNF. MRI negative for acute fx.     PMH: L periprosthetic femoral shaft fx around previous IMN, s/p IMN on 01/02/24, anxiety, CAD, HLD, DM2 on insulin , nephrolithaisis, BPH    PT Comments  PT - Cognition Comments: Pt AxO x 3 pleasant and remembered Therapist from prior admit.  Following all instructions.  Pleasant.  States he uses a RETAIL BUYER at home. Assisted OOB to [facility] per Pt request to have a BM.  Pt was self able to transition from supine to EOB without assist.  General transfer comment: Pt self able to rise from elevated bed as well as on/off BSC TWICE for a BM.  Good use of hands to steady self.  VC's on safety with turns as Pt tends to sit before turn completion. General Gait Details: assisted with amb a functional distance with walker with Min VC's for upright posture as Pt tends to lean too far forward.  Also Min VC's on safety with turns and also to decrease his gait speed to increase control.  Tolerated session well. Returned to room and positioned in recliner nearly fully reclined and B LE elevated to comfort. Pt plans to D/C back home later today with Family support.    If plan is discharge home, recommend the following: A little help with walking and/or transfers;A little help with bathing/dressing/bathroom;Help with stairs or ramp for entrance;Assistance with cooking/housework;Assist for transportation   Can travel by private vehicle        Equipment Recommendations  None recommended by PT    Recommendations for Other Services       Precautions / Restrictions Precautions Precautions: Fall Restrictions Weight Bearing Restrictions Per Provider Order: No     Mobility  Bed Mobility Overal bed mobility: Needs Assistance Bed Mobility: Supine to Sit      Supine to sit: Supervision, HOB elevated     General bed mobility comments: Pt was self able to transition OOB to EOB on his own with increased time.    Transfers Overall transfer level: Needs assistance Equipment used: Rolling walker (2 wheels) Transfers: Sit to/from Stand Sit to Stand: Supervision, Contact guard assist           General transfer comment: Pt self able to rise from elevated bed as well as on/off BSC TWICE for a BM.  Good use of hands to steady self.  VC's on safety with turns as Pt tends to sit before turn completion.    Ambulation/Gait Ambulation/Gait assistance: Supervision, Contact guard assist Gait Distance (Feet): 55 Feet Assistive device: Rolling walker (2 wheels) Gait Pattern/deviations: Decreased step length - right, Decreased step length - left, Knee flexed in stance - left, Trunk flexed Gait velocity: too fast     General Gait Details: assisted with amb a functional distance with walker with Min VC's for upright posture as Pt tends to lean too far forward.  Also Min VC's on safety with turns and also9 to decrease his gait speed to increase control.   Stairs             Wheelchair Mobility     Tilt Bed    Modified Rankin (Stroke Patients Only)       Balance  Communication Communication Communication: Impaired Factors Affecting Communication: Hearing impaired  Cognition   Behavior During Therapy: WFL for tasks assessed/performed   PT - Cognitive impairments: No apparent impairments                       PT - Cognition Comments: Pt AxO x 3 pleasant and remembered Therapist from prior admit.  Following all instructions.  Pleasant.  States he uses a RETAIL BUYER at home. Following commands: Intact      Cueing Cueing Techniques: Verbal cues  Exercises      General Comments        Pertinent Vitals/Pain Pain Assessment Pain Assessment: Faces Faces Pain  Scale: Hurts a little bit Pain Location: buttocks Pain Descriptors / Indicators: Aching, Grimacing Pain Intervention(s): Monitored during session, Repositioned    Home Living                          Prior Function            PT Goals (current goals can now be found in the care plan section) Progress towards PT goals: Progressing toward goals    Frequency    Min 2X/week      PT Plan      Co-evaluation              AM-PAC PT 6 Clicks Mobility   Outcome Measure  Help needed turning from your back to your side while in a flat bed without using bedrails?: A Little Help needed moving from lying on your back to sitting on the side of a flat bed without using bedrails?: A Little Help needed moving to and from a bed to a chair (including a wheelchair)?: A Little Help needed standing up from a chair using your arms (e.g., wheelchair or bedside chair)?: A Little Help needed to walk in hospital room?: A Little Help needed climbing 3-5 steps with a railing? : A Little 6 Click Score: 18    End of Session Equipment Utilized During Treatment: Gait belt Activity Tolerance: Patient tolerated treatment well Patient left: with call bell/phone within reach;in bed;with bed alarm set Nurse Communication: Mobility status PT Visit Diagnosis: Other abnormalities of gait and mobility (R26.89);History of falling (Z91.81)     Time: 8965-8882 PT Time Calculation (min) (ACUTE ONLY): 43 min  Charges:    $Gait Training: 8-22 mins $Therapeutic Activity: 23-37 mins PT General Charges $$ ACUTE PT VISIT: 1 Visit                     Katheryn Leap  PTA Acute  Rehabilitation Services Office M-F          [phone]

## 2024-03-02 NOTE — Discharge Summary (Signed)
 " Physician Discharge Summary  [name] FMW:[id number] DOB: 1934/05/23 DOA: 02/27/2024  PCP: [name], MD  Admit date: 02/27/2024 Discharge date: 03/02/2024  Admitted From: Home Disposition: Home with home health  Recommendations for Outpatient Follow-up:  Follow up with PCP in 1-2 weeks Consider further workup/follow-up regarding iliopsoas mass and/found on MR imaging if aligns with goals of care Hold furosemide  until Saturday 1/24 until completes antibiotic course of Bactrim  CMP 1-2-week  Home Health: RN/PT Equipment/Devices: None  Discharge Condition: Stable CODE STATUS: Full code Diet recommendation: Heart healthy/consistent carb rich diet  History of present illness:  [name] is a 89 year old male with past medical history significant for HTN, DM2, chronic combined systolic/diastolic congestive heart failure (35 - 40%), left kidney mass (follows with urology), history of tremors thought to be nonepileptic psychogenic seizures, left hip intertrochanteric fracture s/p IM nail 11/2023 with hospitalization complicated by anemia with acute blood loss secondary to large abdominal wall hematoma and left 9th/10th rib nondisplaced fractures who presented to [facility] ED on 02/26/2024 from home with left hip pain with new recent falls.  Patient reports missed his chair when he was trying to sit down and fell onto his bottom with subsequent pain.  Unable to ambulate.  Workup in the ED notable for sodium 141, calcium  2.7, BUN 20, creatinine 0.9.  AST 24 ALT 11.  WBC count 8.4, hemoglobin 12.8, platelet count 311.  TSH 4.7, hemoglobin A1c 8.7%.  CT hip with old left intertrochanter fracture with extension to the proximal femoral shaft with early callus formation.  TRH was consulted for further evaluation and management of left hip pain secondary to fall.  Hospital course:  Left hip pain Recent left hip intertrochanteric fracture s/p IM nail (11/2023) Patient  presenting to ED after mechanical fall at home with left hip pain.  Complicated by recent left hip intertrochanteric fracture in which he underwent IM nail October 2025.  CT left hip with noted old left intertrochanter fracture with extension to the proximal femoral shaft with early callus formation.  MR left hip with postoperative changes related to ORIF of left femur comminuted fracture with associated susceptibility artifact which limits evaluation, alignment appears grossly unchanged, areas of marginal callus formation visualized on prior CT, no evidence of acute fracture or dislocation.  Seen by PT with recommendation of home health.  TOC consulted, resume home health PT/RN.  Outpatient follow-up with orthopedics as scheduled/needed.  Right ileus psoas muscle mass MR left hip with incidental finding of  4.2 x 3.1 x 2.9 cm indeterminate ovoid T2 hyperintense mass centered within the right iliopsoas muscle in the proximal right thigh with corresponding enhancement. Superiorly this mass appears contiguous with a nonenhancing macroscopic fat component measuring approximately 2.3 x 1.7 cm. Mild perilesional edema. Differential considerations include liposarcoma-spectrum lesion, additional soft tissue neoplasm, or peripheral nerve sheath tumor. Consider further evaluation with image guided biopsy for more definitive characterization.  Outpatient follow-up with PCP, consider further workup if aligns with goals of care.  Transaminitis Likely secondary to acetaminophen  use outpatient.  LFTs were monitored and trending down.  Recommend reduced dose of acetaminophen  and outpatient follow-up with PCP with repeat labs 1-2 weeks.  Citrobacter urinary tract infection Will continue Bactrim  on discharge to complete a 5-day course.  Chronic systolic congestive heart failure, compensated Continue carvedilol  12.5 mg p.o. twice daily, losartan  50 g p.o. daily, amlodipine  10 mg p.o. daily, hydralazine  50 g p.o. 3 times  daily.  Hold Lasix  until completes antibiotic  course with Bactrim .  Outpatient follow-up with cardiology.  Type 2 diabetes mellitus Hemoglobin C 8.7%, not optimally controlled.  Continue home Jardiance , Semglee  30 units excipients daily, NovoLog  sliding scale.  Outpatient follow-up with PCP  Psychogenic nonepileptic seizures Continue Lexapro  20 mg p.o. daily  Acute urinary retention: Resolved BPH Foley catheter was placed on admission, discontinued with no issues urinating.  Continue Flomax .  Left kidney mass Patient unaware of this diagnosis, has some does not wish him to know regarding this.  Followed by urology, Dr. Renda.  Continue outpatient follow-up.  Discharge Diagnoses:  Principal Problem:   Hip pain    Discharge Instructions  Discharge Instructions     Call MD for:  difficulty breathing, headache or visual disturbances   Complete by: As directed    Call MD for:  extreme fatigue   Complete by: As directed    Call MD for:  persistant dizziness or light-headedness   Complete by: As directed    Call MD for:  persistant nausea and vomiting   Complete by: As directed    Call MD for:  severe uncontrolled pain   Complete by: As directed    Call MD for:  temperature >100.4   Complete by: As directed    Increase activity slowly   Complete by: As directed       Allergies as of 03/02/2024       Reactions   Morphine  Nausea And Vomiting, Nausea Only   Can take with food and usually doesn't cause nausea   Atorvastatin  Other (See Comments)   Intolerant - nausea/myalgias   Codeine Phosphate Nausea And Vomiting   Metformin  And Related Diarrhea        Medication List     PAUSE taking these medications    furosemide  40 MG tablet Wait to take this until: March 05, 2024 Commonly known as: LASIX  Take 1 tablet (40 mg total) by mouth daily. Can take an additional 20mg  as needed for weight gain of 2 lbs in a day or 5lbs in a week       STOP taking these  medications    potassium chloride  10 MEQ tablet Commonly known as: KLOR-CON  M   senna-docusate 8.6-50 MG tablet Commonly known as: Senokot-S   [name]  SoloStar 300 UNIT/ML Solostar Pen Generic drug: insulin  glargine (1 Unit Dial )       TAKE these medications    Accu-Chek Softclix Lancets lancets TEST BLOOD SUGAR UP TO THREE TIMES DAILY   acetaminophen  500 MG tablet Commonly known as: TYLENOL  Take 1 tablet (500 mg total) by mouth every 6 (six) hours as needed for mild pain (pain score 1-3). What changed:  how much to take when to take this reasons to take this   amLODipine  10 MG tablet Commonly known as: NORVASC  Take 1 tablet (10 mg total) by mouth every morning.   aspirin  EC 81 MG tablet Swallow whole. Take 1 daily after finishing eliquis . What changed:  how much to take how to take this when to take this additional instructions   carvedilol  12.5 MG tablet Commonly known as: COREG  TAKE 1 TABLET BY MOUTH TWICE  DAILY WITH MEALS   COD LIVER OIL PO Take 1 capsule by mouth 2 (two) times daily.   cyanocobalamin  1000 MCG tablet Commonly known as: VITAMIN B12 Take 1 tablet (1,000 mcg total) by mouth daily.   diclofenac  Sodium 1 % Gel Commonly known as: VOLTAREN  Apply 2 g topically 4 (four) times daily. What changed:  when to  take this reasons to take this   Droplet Pen Needles 30G X 8 MM Misc Generic drug: Insulin  Pen Needle USE  WITH  INSULIN   PEN What changed: Another medication with the same name was added. Make sure you understand how and when to take each.   Pen Needles 31G X 5 MM Misc 1 each by Does not apply route as directed. Dispense based on patient and insurance preference. Use up to four times daily as directed. (FOR ICD-10 E10.9, E11.9). What changed: You were already taking a medication with the same name, and this prescription was added. Make sure you understand how and when to take each.   empagliflozin  10 MG Tabs tablet Commonly known as:  Jardiance  Take 1 tablet (10 mg total) by mouth daily before breakfast. Bill secondary: BIN [id number], PCN [id number], GRP [id number], ID [id number]   escitalopram  20 MG tablet Commonly known as: Lexapro  Take 1 tablet (20 mg total) by mouth daily.   hydrALAZINE  50 MG tablet Commonly known as: APRESOLINE  TAKE 1 TABLET BY MOUTH 3 TIMES  DAILY   insulin  aspart 100 UNIT/ML injection Commonly known as: novoLOG  Inject 0-9 Units into the skin 3 (three) times daily with meals.   insulin  glargine-yfgn 100 UNIT/ML injection Commonly known as: SEMGLEE  Inject 0.3 mLs (30 Units total) into the skin daily at 10 pm.   insulin  lispro 100 UNIT/ML injection Commonly known as: HUMALOG  Inject 10-12 Units into the skin 3 (three) times daily before meals.   losartan  50 MG tablet Commonly known as: COZAAR  Take 1 tablet (50 mg total) by mouth daily.   meclizine  12.5 MG tablet Commonly known as: ANTIVERT  Take 12.5 mg by mouth 3 (three) times daily as needed for dizziness.   methocarbamol  500 MG tablet Commonly known as: ROBAXIN  Take 0.5 tablets (250 mg total) by mouth every 6 (six) hours as needed for muscle spasms.   OneTouch Ultra test strip Generic drug: glucose blood USE TO CHECK UP TO 3 TIMES DAILY AS NEEDED   polyethylene glycol 17 g packet Commonly known as: MIRALAX  / GLYCOLAX  Take 17 g by mouth daily as needed for moderate constipation.   sulfamethoxazole -trimethoprim  800-160 MG tablet Commonly known as: Bactrim  DS Take 1 tablet by mouth 2 (two) times daily for 2 days. Start taking on: March 03, 2024   tamsulosin  0.4 MG Caps capsule Commonly known as: FLOMAX  Take 1 capsule (0.4 mg total) by mouth daily after supper.   traMADol  50 MG tablet Commonly known as: ULTRAM  Take 1-2 tablets (50-100 mg total) by mouth every 6 (six) hours as needed for moderate pain (pain score 4-6) or severe pain (pain score 7-10).        Follow-up Information     [facility]) Follow  up.   Specialty: Home Health Services Why: Please continue Home Health PT/RN services with this provider upon discharge. Contact information: [address] [place]   [address] [phone]        [name], MD. Schedule an appointment as soon as possible for a visit in 1 week(s).   Specialty: Family Medicine Contact information: [address] [place] [address] [phone]                Allergies[1]  Consultations: None   Procedures/Studies: MR HIP LEFT W WO CONTRAST Result Date: 02/28/2024 CLINICAL DATA:  Pain after recent fall. Status post ORIF of a left femoral intertrochanteric fracture on 01/02/2024. EXAM: MRI OF THE LEFT HIP  WITHOUT AND WITH CONTRAST TECHNIQUE: Multiplanar, there is suggestion of a thin fat rim around the lesion on T1 sequences MR imaging was performed both before and after administration of intravenous contrast. CONTRAST:  9mL GADAVIST  GADOBUTROL  1 MMOL/ML IV SOLN COMPARISON:  CT of the pelvis and left hip dated 02/27/2024. FINDINGS: Bone/Joint Postoperative changes related to ORIF of left femur comminuted intertrochanteric fracture with associated susceptibility artifact, which limits evaluation of the surrounding bone and soft tissues. Alignment appears grossly unchanged. Areas of marginal callus formation, better visualized on the prior CT. Heterotopic ossification medial to the proximal femoral shaft. No evidence of acute fracture or dislocation. No aggressive osseous lesion. No joint effusion. Mild-to-moderate osteoarthritis of the left hip and mild osteoarthritis of the right hip with joint space narrowing and osteophytosis. Sacroiliac joints and pubic symphysis are anatomically aligned with mild degenerative changes. Degenerative changes of the visualized lower lumbar spine. Muscles and Tendons There is edema within the left-greater-than-right iliacus muscles. Asymmetric atrophy of the left gluteal  musculature relative to the right with increased T2 signal within the left gluteal muscles, which could reflect muscular contusion secondary to recent fall versus denervation changes. No discrete intramuscular collection. Hamstring origins are intact. No significant bursal fluid. There is a 4.2 x 3.1 x 2.9 cm ovoid heterogenous T2 hyperintense mass centered within the right iliopsoas muscle in the proximal right thigh with corresponding T1 isointensity and heterogenous enhancement on postcontrast sequences (series 4 and 5, image 32 and series 16, image 31). Superiorly this mass appears contiguous with an additional region of nonenhancing T1 hyperintensity demonstrating saturation on fluid sensitive sequences measuring approximately 2.3 x 1.7 cm (series 4 and 5, images 34). Mild perilesional edema. No significant surrounding enhancement. Viscera Markedly enlarged prostate. Large volume of stool within the rectum. IMPRESSION: 1. Postoperative changes related to ORIF of left femur comminuted intertrochanteric fracture with associated susceptibility artifact, which limits evaluation of the surrounding bone and soft tissues. Alignment appears grossly unchanged. Areas of marginal callus formation, better visualized on the prior CT. Heterotopic ossification medial to the proximal femoral shaft. 2. No evidence of acute fracture or dislocation. 3. Asymmetric atrophy of the left gluteal musculature relative to the right with increased T2 signal, which could reflect muscular contusion secondary to recent fall versus denervation changes. 4. Nonspecific edema within the left-greater-than-right iliacus muscles. 5. 4.2 x 3.1 x 2.9 cm indeterminate ovoid T2 hyperintense mass centered within the right iliopsoas muscle in the proximal right thigh with corresponding enhancement. Superiorly this mass appears contiguous with a nonenhancing macroscopic fat component measuring approximately 2.3 x 1.7 cm. Mild perilesional edema.  Differential considerations include liposarcoma-spectrum lesion, additional soft tissue neoplasm, or peripheral nerve sheath tumor. Consider further evaluation with image guided biopsy for more definitive characterization. Electronically Signed   By: Harrietta Sherry M.D.   On: 02/28/2024 12:03   CT Hip Left Wo Contrast Result Date: 02/27/2024 EXAM: CT OF THE LEFT HIP WITHOUT IV CONTRAST 02/27/2024 08:45:46 PM TECHNIQUE: CT of the left hip was performed without the administration of intravenous contrast. Multiplanar reformatted images are provided for review. Automated exposure control, iterative reconstruction, and/or weight based adjustment of the mA/kV was utilized to reduce the radiation dose to as low as reasonably achievable. COMPARISON: None available. CLINICAL HISTORY: Hip replacement, periprosthetic fracture suspected. FINDINGS: BONES: Old left femoral intertrochanteric fracture with extension into the proximal femoral shaft. Dynamic hip screw and femoral nail are in place across the fracture. Early evidence of healing within the femoral shaft component with  callus formation. Otherwise, no significant evidence of healing. Fracture lines remain evident. Mild displacement of fracture fragments is present in the region of the proximal femoral shaft. No additional acute fracture seen. No aggressive appearing osseous abnormality or periostitis. SOFT TISSUE: Heterotopic bone formation is noted in the soft tissues medial to the proximal femoral shaft. No significant soft tissue edema or fluid collections. No soft tissue mass. JOINT: No subluxation or dislocation. No significant degenerative changes. No osseous erosions. INTRAPELVIC CONTENTS: Aortoiliac atherosclerosis is present. Large stool burden is noted in the colon. IMPRESSION: 1. Old left femoral intertrochanteric fracture with extension into the proximal femoral shaft, treated with dynamic hip screw and femoral nail, with early callus formation in the  femoral shaft component but otherwise no significant healing. 2. Heterotopic bone formation in the soft tissues medial to the proximal femoral shaft. Electronically signed by: Franky Crease MD 02/27/2024 09:01 PM EST RP Workstation: [id number]   CT PELVIS WO CONTRAST Result Date: 02/27/2024 EXAM: CT PELVIS, WITHOUT IV CONTRAST 02/27/2024 08:45:46 PM TECHNIQUE: Axial images were acquired through the pelvis without IV contrast. Reformatted images were reviewed. Automated exposure control, iterative reconstruction, and/or weight based adjustment of the mA/kV was utilized to reduce the radiation dose to as low as reasonably achievable. COMPARISON: None available. CLINICAL HISTORY: Pelvic fracture. FINDINGS: BONES: Old fracture is noted in the left femoral intertrochanteric region and extending into the proximal left femoral shaft. Dynamic hip screw and femoral nail noted in place. Early callus formation around the femoral shaft component of the fracture. Otherwise, no significant evidence of healing a fracture line remains evident. No acute fracture seen. JOINTS: No subluxation or dislocation. The joint spaces are normal. SOFT TISSUES: The soft tissues are unremarkable. INTRAPELVIC CONTENTS: Aortoiliac atherosclerosis. 2 mm nonobstructing stone in the lower pole of the left kidney. Large stool burden in the rectum could reflect fecal impaction. Prostate enlargement. IMPRESSION: 1. No acute fracture, subluxation, or dislocation. 2. Old fracture in the left femoral intertrochanteric region and proximal shaft with fixation hardware and early callus formation around the shaft component, without additional evidence of significant interval healing. 3. Large stool burden in the rectum, which could reflect fecal impaction. 4. Left lower pole nephrolithiasis 5. Aortoiliac atherosclerosis. Electronically signed by: Franky Crease MD 02/27/2024 08:55 PM EST RP Workstation: [id number]   CT Lumbar Spine Wo Contrast Result Date:  02/27/2024 EXAM: CT OF THE LUMBAR SPINE WITHOUT CONTRAST 02/27/2024 08:45:46 PM TECHNIQUE: CT of the lumbar spine was performed without the administration of intravenous contrast. Multiplanar reformatted images are provided for review. Automated exposure control, iterative reconstruction, and/or weight based adjustment of the mA/kV was utilized to reduce the radiation dose to as low as reasonably achievable. COMPARISON: None available. CLINICAL HISTORY: Low back pain, increased fracture risk. FINDINGS: BONES AND ALIGNMENT: Normal vertebral body heights. Normal alignment. No suspicious bone lesion. Healing posterior left 11th rib fracture. DEGENERATIVE CHANGES: Diffuse degenerative disc disease with disc space narrowing and anterior spurring. Diffuse bilateral degenerative facet disease. SOFT TISSUES: No acute abnormality. IMPRESSION: 1. No acute osseous abnormality of the lumbar spine. 2. Healing posterior left 11th rib fracture. Electronically signed by: Franky Crease MD 02/27/2024 08:50 PM EST RP Workstation: [id number]   DG Knee Complete 4 Views Left Result Date: 02/27/2024 EXAM: 4 VIEW(S) XRAY OF THE LEFT KNEE 02/27/2024 05:30:52 PM COMPARISON: None available. CLINICAL HISTORY: fall, s/p orif 11/25 FINDINGS: BONES AND JOINTS: Left femoral hardware partially visualized in the distal femur. Advanced tricompartmental degenerative changes in the left knee. Small joint  effusion. No acute fracture, subluxation, or dislocation. SOFT TISSUES: Diffuse vascular calcifications. IMPRESSION: 1. No acute fracture, subluxation, or dislocation. 2. Advanced tricompartmental degenerative changes in the left knee with small joint effusion. Electronically signed by: Franky Crease MD 02/27/2024 05:48 PM EST RP Workstation: [id number]   CT Cervical Spine Wo Contrast Result Date: 02/27/2024 EXAM: CT CERVICAL SPINE WITHOUT CONTRAST 02/27/2024 05:40:17 PM TECHNIQUE: CT of the cervical spine was performed without the administration of  intravenous contrast. Multiplanar reformatted images are provided for review. Automated exposure control, iterative reconstruction, and/or weight based adjustment of the mA/kV was utilized to reduce the radiation dose to as low as reasonably achievable. COMPARISON: 01/01/2024 CLINICAL HISTORY: Neck trauma (Age >= 65y) FINDINGS: BONES AND ALIGNMENT: No acute fracture or traumatic malalignment. DEGENERATIVE CHANGES: Flowing anterior osteophytes compatible with diffuse idiopathic skeletal hyperostosis. Advanced diffuse bilateral degenerative facet disease. SOFT TISSUES: No prevertebral soft tissue swelling. IMPRESSION: 1. No evidence of acute cervical spine injury. 2. Diffuse idiopathic skeletal hyperostosis with advanced diffuse bilateral degenerative facet disease. Electronically signed by: Franky Crease MD 02/27/2024 05:47 PM EST RP Workstation: [id number]   CT Head Wo Contrast Result Date: 02/27/2024 EXAM: CT HEAD WITHOUT CONTRAST 02/27/2024 05:40:17 PM TECHNIQUE: CT of the head was performed without the administration of intravenous contrast. Automated exposure control, iterative reconstruction, and/or weight based adjustment of the mA/kV was utilized to reduce the radiation dose to as low as reasonably achievable. COMPARISON: None available. CLINICAL HISTORY: Head trauma, moderate-severe. FINDINGS: BRAIN AND VENTRICLES: Diffuse cerebral atrophy. No acute hemorrhage. No evidence of acute infarct. No hydrocephalus. No extra-axial collection. No mass effect or midline shift. ORBITS: No acute abnormality. SINUSES: No acute abnormality. SOFT TISSUES AND SKULL: No acute soft tissue abnormality. No skull fracture. IMPRESSION: 1. No acute intracranial abnormality. Electronically signed by: Franky Crease MD 02/27/2024 05:45 PM EST RP Workstation: [id number]   DG Hip Unilat With Pelvis 2-3 Views Left Result Date: 02/27/2024 EXAM: 2 or 3 VIEW(S) XRAY OF THE LEFT HIP 02/27/2024 05:30:52 PM COMPARISON: Prior study. CLINICAL  HISTORY: fall, s/p orif 11/25 FINDINGS: BONES AND JOINTS: Changes of internal fixation are seen in the left femur. There is an AP and oblique displaced proximal left femoral fracture, which is unchanged since the prior study with no clear significant healing. No acute fracture, subluxation, or dislocation. No malalignment. SOFT TISSUES: Unremarkable. IMPRESSION: 1. No acute fracture, subluxation, or dislocation. 2. Unchanged displaced proximal left femoral fracture with internal fixation, with no clear significant healing. Electronically signed by: Franky Crease MD 02/27/2024 05:44 PM EST RP Workstation: [id number]     Subjective: Patient seen examined bedside, lying in bed.  No specific complaints.  Discussed discharge home today with home health.  Denies headache, no dizziness, no chest pain, no shortness of breath, no abdominal pain, no fever.  No acute events overnight per nursing staff.  Discharge Exam: Vitals:   03/01/24 2237 03/02/24 0547  BP: (!) 154/85 95/70  Pulse:  69  Resp:  19  Temp:  97.8 F (36.6 C)  SpO2:  96%   Vitals:   03/01/24 1414 03/01/24 2157 03/01/24 2237 03/02/24 0547  BP: (!) 160/81 (!) 154/85 (!) 154/85 95/70  Pulse: 70 67  69  Resp: 20 18  19   Temp: 98.4 F (36.9 C) 98.1 F (36.7 C)  97.8 F (36.6 C)  TempSrc:  Oral  Oral  SpO2: 96% 93%  96%  Weight:      Height:        Physical Exam: GEN: NAD,  alert HEENT: NCAT, PERRL, EOMI, sclera clear, MMM PULM: CTAB w/o wheezes/crackles, normal respiratory effort, on room air CV: RRR w/o M/G/R GI: abd soft, NTND, + BS MSK: no peripheral edema, moving all extremities independently NEURO: No focal neurologic deficit PSYCH: normal mood/affect Integumentary: No concerning rashes/lesions/wounds nonexposed conservative    The results of significant diagnostics from this hospitalization (including imaging, microbiology, ancillary and laboratory) are listed below for reference.     Microbiology: Recent Results  (from the past 240 hours)  Urine Culture (for pregnant, neutropenic or urologic patients or patients with an indwelling urinary catheter)     Status: Abnormal   Collection Time: 02/28/24 11:17 PM   Specimen: Urine, Catheterized  Result Value Ref Range Status   Specimen Description   Final    URINE, CATHETERIZED Performed at [facility], [address] [place], [place] [address]    Special Requests   Final    NONE Performed at [facility], [address], [place], [place] [address]    Culture >=100,000 COLONIES/mL CITROBACTER FREUNDII (A)  Final   Report Status 03/02/2024 FINAL  Final   Organism ID, Bacteria CITROBACTER FREUNDII (A)  Final      Susceptibility   Citrobacter freundii - MIC*    CEFEPIME <=0.12 SENSITIVE Sensitive     ERTAPENEM <=0.12 SENSITIVE Sensitive     CEFTRIAXONE  <=0.25 SENSITIVE Sensitive     CIPROFLOXACIN  >=4 RESISTANT Resistant     GENTAMICIN <=1 SENSITIVE Sensitive     NITROFURANTOIN 32 SENSITIVE Sensitive     TRIMETH /SULFA  <=20 SENSITIVE Sensitive     PIP/TAZO Value in next row Sensitive      8 SENSITIVEThis is a modified FDA-approved test that has been validated and its performance characteristics determined by the reporting laboratory.  This laboratory is certified under the Clinical Laboratory Improvement Amendments CLIA as qualified to perform high complexity clinical laboratory testing.    MEROPENEM Value in next row Sensitive      8 SENSITIVEThis is a modified FDA-approved test that has been validated and its performance characteristics determined by the reporting laboratory.  This laboratory is certified under the Clinical Laboratory Improvement Amendments CLIA as qualified to perform high complexity clinical laboratory testing.    * >=100,000 COLONIES/mL CITROBACTER FREUNDII     Labs: BNP (last 3 results) Recent Labs    04/08/23 2225 06/08/23 1428  BNP 618.2* 872.4*   Basic Metabolic Panel: Recent Labs   Lab 02/27/24 2357 02/27/24 2358 02/28/24 0923 02/29/24 0814 03/01/24 0831 03/02/24 0325  NA 137  --  136 138 141 138  K 3.3*  --  3.2* 3.2* 3.2* 3.6  CL 101  --  102 102 104 103  CO2 23  --  26 26 28 23   GLUCOSE 245*  --  220* 86 90 271*  BUN 19  --  20 22 18 22   CREATININE 1.13  1.08  --  1.15 1.29* 1.03 1.25*  CALCIUM  10.3  --  9.5 10.1 9.9 9.8  MG  --  2.1  --   --   --   --    Liver Function Tests: Recent Labs  Lab 02/27/24 2357 02/28/24 0923 02/29/24 0814 03/01/24 0831 03/02/24 0325  AST 22 16 1,115* 159* 54*  ALT 19 11 762* 394* 261*  ALKPHOS 101 95 230* 187* 163*  BILITOT 0.2 0.2 0.4 0.3 <0.2  PROT 6.4* 6.0* 6.1* 6.3* 5.9*  ALBUMIN  3.9 3.7 3.7 3.7 3.4*   No results for  input(s): LIPASE, AMYLASE in the last 168 hours. No results for input(s): AMMONIA in the last 168 hours. CBC: Recent Labs  Lab 02/27/24 1708 02/27/24 2357 02/28/24 0923 02/29/24 0814 03/01/24 0831 03/02/24 0325  WBC 8.4 7.7 6.8 6.8 6.2 6.4  NEUTROABS 5.3  --  3.4 3.6 2.7 3.0  HGB 12.8* 13.6 12.8* 12.7* 13.3 12.8*  HCT 38.6* 41.2 39.1 38.8* 39.9 39.5  MCV 91.5 92.0 90.9 91.1 91.3 92.1  PLT 311 328 324 304 312 318   Cardiac Enzymes: No results for input(s): CKTOTAL, CKMB, CKMBINDEX, TROPONINI in the last 168 hours. BNP: Invalid input(s): POCBNP CBG: Recent Labs  Lab 03/01/24 1200 03/01/24 1744 03/01/24 2203 03/02/24 0752 03/02/24 1134  GLUCAP 178* 223* 274* 141* 186*   D-Dimer No results for input(s): DDIMER in the last 72 hours. Hgb A1c No results for input(s): HGBA1C in the last 72 hours. Lipid Profile No results for input(s): CHOL, HDL, LDLCALC, TRIG, CHOLHDL, LDLDIRECT in the last 72 hours. Thyroid  function studies No results for input(s): TSH, T4TOTAL, T3FREE, THYROIDAB in the last 72 hours.  Invalid input(s): FREET3 Anemia work up No results for input(s): VITAMINB12, FOLATE, FERRITIN, TIBC, IRON, RETICCTPCT in  the last 72 hours. Urinalysis    Component Value Date/Time   COLORURINE YELLOW 02/28/2024 2316   APPEARANCEUR HAZY (A) 02/28/2024 2316   LABSPEC 1.014 02/28/2024 2316   PHURINE 5.0 02/28/2024 2316   GLUCOSEU >=500 (A) 02/28/2024 2316   HGBUR SMALL (A) 02/28/2024 2316   BILIRUBINUR NEGATIVE 02/28/2024 2316   KETONESUR NEGATIVE 02/28/2024 2316   PROTEINUR 100 (A) 02/28/2024 2316   UROBILINOGEN 0.2 02/02/2014 1509   NITRITE NEGATIVE 02/28/2024 2316   LEUKOCYTESUR MODERATE (A) 02/28/2024 2316   Sepsis Labs Recent Labs  Lab 02/28/24 0923 02/29/24 0814 03/01/24 0831 03/02/24 0325  WBC 6.8 6.8 6.2 6.4   Microbiology Recent Results (from the past 240 hours)  Urine Culture (for pregnant, neutropenic or urologic patients or patients with an indwelling urinary catheter)     Status: Abnormal   Collection Time: 02/28/24 11:17 PM   Specimen: Urine, Catheterized  Result Value Ref Range Status   Specimen Description   Final    URINE, CATHETERIZED Performed at [facility], [address], [place], [place] [address]    Special Requests   Final    NONE Performed at [facility], [address] [place], [place] [address]    Culture >=100,000 COLONIES/mL CITROBACTER FREUNDII (A)  Final   Report Status 03/02/2024 FINAL  Final   Organism ID, Bacteria CITROBACTER FREUNDII (A)  Final      Susceptibility   Citrobacter freundii - MIC*    CEFEPIME <=0.12 SENSITIVE Sensitive     ERTAPENEM <=0.12 SENSITIVE Sensitive     CEFTRIAXONE  <=0.25 SENSITIVE Sensitive     CIPROFLOXACIN  >=4 RESISTANT Resistant     GENTAMICIN <=1 SENSITIVE Sensitive     NITROFURANTOIN 32 SENSITIVE Sensitive     TRIMETH /SULFA  <=20 SENSITIVE Sensitive     PIP/TAZO Value in next row Sensitive      8 SENSITIVEThis is a modified FDA-approved test that has been validated and its performance characteristics determined by the reporting laboratory.  This laboratory is certified under the  Clinical Laboratory Improvement Amendments CLIA as qualified to perform high complexity clinical laboratory testing.    MEROPENEM Value in next row Sensitive      8 SENSITIVEThis is a modified FDA-approved test that has been validated and its performance characteristics determined by the  reporting laboratory.  This laboratory is certified under the Clinical Laboratory Improvement Amendments CLIA as qualified to perform high complexity clinical laboratory testing.    * >=100,000 COLONIES/mL CITROBACTER FREUNDII     Time coordinating discharge: Over 30 minutes  SIGNED:   Camellia PARAS Leili Eskenazi, DO  [facility] 03/02/2024, 12:20 PM     [1]  Allergies Allergen Reactions   Morphine  Nausea And Vomiting and Nausea Only    Can take with food and usually doesn't cause nausea   Atorvastatin  Other (See Comments)    Intolerant - nausea/myalgias   Codeine Phosphate Nausea And Vomiting   Metformin  And Related Diarrhea   "

## 2024-03-03 ENCOUNTER — Telehealth: Payer: Self-pay | Admitting: *Deleted

## 2024-03-03 DIAGNOSIS — I2510 Atherosclerotic heart disease of native coronary artery without angina pectoris: Secondary | ICD-10-CM | POA: Diagnosis not present

## 2024-03-03 DIAGNOSIS — I5042 Chronic combined systolic (congestive) and diastolic (congestive) heart failure: Secondary | ICD-10-CM | POA: Diagnosis not present

## 2024-03-03 DIAGNOSIS — S72142D Displaced intertrochanteric fracture of left femur, subsequent encounter for closed fracture with routine healing: Secondary | ICD-10-CM | POA: Diagnosis not present

## 2024-03-03 DIAGNOSIS — N138 Other obstructive and reflux uropathy: Secondary | ICD-10-CM | POA: Diagnosis not present

## 2024-03-03 DIAGNOSIS — N401 Enlarged prostate with lower urinary tract symptoms: Secondary | ICD-10-CM | POA: Diagnosis not present

## 2024-03-03 DIAGNOSIS — E785 Hyperlipidemia, unspecified: Secondary | ICD-10-CM | POA: Diagnosis not present

## 2024-03-03 DIAGNOSIS — M9702XD Periprosthetic fracture around internal prosthetic left hip joint, subsequent encounter: Secondary | ICD-10-CM | POA: Diagnosis not present

## 2024-03-03 DIAGNOSIS — I152 Hypertension secondary to endocrine disorders: Secondary | ICD-10-CM | POA: Diagnosis not present

## 2024-03-03 DIAGNOSIS — E1159 Type 2 diabetes mellitus with other circulatory complications: Secondary | ICD-10-CM | POA: Diagnosis not present

## 2024-03-03 DIAGNOSIS — S2242XD Multiple fractures of ribs, left side, subsequent encounter for fracture with routine healing: Secondary | ICD-10-CM | POA: Diagnosis not present

## 2024-03-03 DIAGNOSIS — N2889 Other specified disorders of kidney and ureter: Secondary | ICD-10-CM | POA: Diagnosis not present

## 2024-03-03 DIAGNOSIS — F419 Anxiety disorder, unspecified: Secondary | ICD-10-CM | POA: Diagnosis not present

## 2024-03-03 NOTE — Transitions of Care (Post Inpatient/ED Visit) (Signed)
" ° °  03/03/2024  Name: [name] MRN: [id number] DOB: 1934-07-28  Today's TOC FU Call Status: Today's TOC FU Call Status:: Unsuccessful Call (1st Attempt) Unsuccessful Call (1st Attempt) Date: 03/03/24  Attempted to reach the patient regarding the most recent Inpatient/ED visit.  Follow Up Plan: Additional outreach attempts will be made to reach the patient to complete the Transitions of Care (Post Inpatient/ED visit) call.   Andrea Dimes RN, BSN [place]  [facility] RN Care Manager [phone]  "

## 2024-03-07 ENCOUNTER — Other Ambulatory Visit: Payer: Self-pay | Admitting: Family Medicine

## 2024-03-15 ENCOUNTER — Ambulatory Visit: Payer: Self-pay

## 2024-03-15 NOTE — Telephone Encounter (Signed)
 FYI Only or Action Required?: FYI only for provider: ED advised.  Patient was last seen in primary care on 01/26/2024 by [name], MD.  Called Nurse Triage reporting Bleeding/Bruising.  Symptoms began today.  Interventions attempted: Nothing.  Symptoms are: gradually worsening.  Triage Disposition: Go to ED Now (Notify PCP)  Patient/caregiver understands and will follow disposition?:  Reason for Disposition  Passing pure blood or large blood clots (i.e., size > a dime)  (Exception: Fleck or small strands.)  Answer Assessment - Initial Assessment Questions Pts son states patient began bleeding from his penis at 0900 this morning, present with and without urination. States Eliquis  was discontinued in Dec or Jan. States approx 1/2 cup of bright red blood so far. Son states bleeding is continuous.   Patients son states he denies weakness, but son states he complains of dizziness often. Denies abdominal pain, fever, burning with urination or flank pain. Hx of renal lesion.  This RN advised ED, son requested EMS due to road conditions. This RN contacted 911 on patient's behalf.   1. COLOR of URINE: Describe the color of the urine.  (e.g., tea-colored, pink, red, bloody) Do you have blood clots in your urine? (e.g., none, pea, grape, small coin)     Frank, red blood with and without urination 2. ONSET: When did the bleeding start?      This morning 3. EPISODES: How many times has there been blood in the urine? or How many times today?     Continuous  Protocols used: Urine - Blood In-A-AH Message from [place] R sent at 03/15/2024  9:34 AM EST  Reason for Triage: Patient is bleeding from his penis. Just started this morning, states he is in the bathroom and there is a lot of blood in the toilet.

## 2024-03-15 NOTE — Telephone Encounter (Signed)
 Per chart, RN contacted 911 on patient's behalf.  Agree.  Thanks.

## 2024-03-17 ENCOUNTER — Telehealth: Payer: Self-pay

## 2024-03-17 ENCOUNTER — Ambulatory Visit: Payer: Self-pay | Admitting: *Deleted

## 2024-03-17 NOTE — Telephone Encounter (Signed)
 Copied from CRM [id number]. Topic: Clinical - Home Health Verbal Orders >> Mar 17, 2024 12:19 PM Hadassah PARAS wrote: Caller/Agency: Lorrene from [facility] Callback Number: [phone] Service Requested: Physical Therapy Frequency: PT evaluation Any new concerns about the patient? Yes, pt had a kidney infection which made him dizzy or light headed that caused him to have a fall

## 2024-03-17 NOTE — Telephone Encounter (Signed)
 FYI Only or Action Required?: Action required by provider: clinical question for provider and update on patient condition.  Patient was last seen in primary care on 01/26/2024 by [name] Arlyss RAMAN, MD.  Called Nurse Triage reporting Fall (Recent hospitalization kidney infection, ) and Hyperglycemia.  Symptoms began several days ago.  Interventions attempted: Rest, hydration, or home remedies.  Symptoms are: gradually worsening.  Triage Disposition: Call PCP Now, Call PCP Within 24 Hours  Patient/caregiver understands and will follow disposition?: Yes   Message from [name] sent at 03/17/2024 10:19 AM EST  Reason for Triage: Patient was seen in the emergency room on the 17th and is needing to follow up, but he was also having bleeding from his genitals about 2 days ago. Patient still has pain that get's severe and he believes this was all caused by the passing of a kidney stone from 2 nights ago. Patient's son also mentioned that he had a fall last night.   Reason for Disposition  [1] Caller has NON-URGENT question AND [2] triager unable to answer question  [1] Caller has URGENT medication or insulin  device (e.g., pump, continuous monitoring) question AND [2] triager unable to answer question  Answer Assessment - Initial Assessment Questions 1. MECHANISM: How did the fall happen?     Falls and off balance 2. DOMESTIC VIOLENCE AND ELDER ABUSE SCREENING: Did you fall because someone pushed you or tried to hurt you? If Yes, ask: Are you safe now?     no 3. ONSET: When did the fall happen? (e.g., minutes, hours, or days ago)     [name] last night- found between chairs 4. LOCATION: What part of the body hit the ground? (e.g., back, buttocks, head, hips, knees, hands, head, stomach)     Unsure- unwitnessed fall 5. INJURY: Did you hurt (injure) yourself when you fell? If Yes, ask: What did you injure? Tell me more about this? (e.g., body area; type of injury; pain severity)      Did complain of pain  6. PAIN: Is there any pain? If Yes, ask: How bad is the pain? (e.g., Scale 0-10; or none, mild,      No pain today 7. SIZE: For cuts, bruises, or swelling, ask: How large is it? (e.g., inches or centimeters)      no  9. OTHER SYMPTOMS: Do you have any other symptoms? (e.g., dizziness, fever, weakness; new-onset or worsening).      Dizziness- stands quickly, denies blood in urine 10. CAUSE: What do you think caused the fall (or falling)? (e.g., dizzy spell, tripped)       Unsure- patient has had increase glucose levels since released from hospital- had changes in medications- see next triage note  Answer Assessment - Initial Assessment Questions Patient's son is concerned about father's glucose level being too high- he states [name]  was stopped at discharge.After reviewing discharge summary- generic for toujeo  was prescribed and that may have caused confusion. Patient's son will make sure to restart the long acting insulin . He still has questions about short acting insulin - as 2 are listed on discharge summary- with different dosing- patient has Humalog  on hand and would like PCP to review dosing and verify what patient is to be taking before meals. Patient advised I would send message to provider to review and let him know. Patient has been scheduled for appointment on Monday- patient will not see any other provider but PCP.   1. BLOOD GLUCOSE: What is your blood glucose level?  9:30am- 276 fasting 2. ONSET: When did you check the blood glucose?     Patient's son is reporting toujeo  was started at discharge and the patient's glucose has been elevated 3. USUAL RANGE: What is your glucose level usually? (e.g., usual fasting morning value, usual evening value)     Fasting before hospitalized- 170-180    Patient has been checking glucose-270 to over 300, almost 400 last night  5. TYPE 1 or 2:  Do you know what type of diabetes you have?  (e.g., Type 1,  Type 2, Gestational; doesn't know)      Type 2 6. INSULIN : Do you take insulin ? What type of insulin (s) do you use? What is the mode of delivery? (syringe, pen; injection or pump)?      Humalog - 8-10 units morning ,afternoon, evenin      7. DIABETES PILLS: Do you take any pills for your diabetes? If Yes, ask: Have you missed taking any pills recently?     no 8. OTHER SYMPTOMS: Do you have any symptoms? (e.g., fever, frequent urination, difficulty breathing, dizziness, weakness, vomiting)     Frequent urination- increased uses of depend- almost doubled , dizziness- not new  Protocols used: Falls and Falling-A-AH, Diabetes - High Blood Sugar-A-AH

## 2024-03-17 NOTE — Telephone Encounter (Signed)
 Please give the order.  Thanks.

## 2024-03-17 NOTE — Telephone Encounter (Signed)
 I would restart glargine gradually.  Start with 10 units per day.    Then gradually increase dose by 2 units per day until his AM sugar is below 150.  His dose would be 10----->12----->14----->16----->etc per day.   Separate instructions for mealtime insulin :   I would restart humalog  5 units with meals in the meantime.    Thanks.

## 2024-03-17 NOTE — Addendum Note (Signed)
 Addended by: CLEATUS LORELI RAMAN on: 03/17/2024 02:19 PM   Modules accepted: Orders

## 2024-03-18 NOTE — Telephone Encounter (Unsigned)
 Copied from CRM [id number]. Topic: Clinical - Medical Advice >> Mar 17, 2024  4:19 PM Alfonso ORN wrote: Reason for CRM:  Lorrene PT with [facility] believes pt  doesn't need PT at this time as his left hip is doing fine and is able to walk around by himself well . Lorrene is also unable to work with him due to high bp  Reading was 180/98 no chest pain or headaches, no other symptoms.  Callback [phone]

## 2024-03-18 NOTE — Telephone Encounter (Signed)
 Called patient son on dpr  reviewed all information and repeated back to me. Will call if any questions.

## 2024-03-18 NOTE — Telephone Encounter (Signed)
 Noted. Thanks.

## 2024-03-18 NOTE — Telephone Encounter (Signed)
 Called and reviewed with patient and son. They have not checked today denies any symptoms. Will continue to have checked over weekend and bring log with them to appointment Monday. Reviewed all red words with patient and family. If any they will have him evaluated in uc or ed over the weekend.

## 2024-03-18 NOTE — Telephone Encounter (Signed)
 If BP remains elevated, then needs OV.  Please keep OV for 03/21/24 and try to check BP in the meantime when at rest.  Thanks.

## 2024-03-18 NOTE — Telephone Encounter (Signed)
 Spoke with Lorrene from [facility] to give the verbal order for PT. He states they are going to hold off on PT until they can get the PT's BP under control. Pt's BP was 180/98

## 2024-03-21 ENCOUNTER — Ambulatory Visit: Admitting: Family Medicine

## 2024-07-26 ENCOUNTER — Ambulatory Visit
